# Patient Record
Sex: Female | Born: 1960 | Race: White | Hispanic: No | Marital: Single | Smoking: Former smoker
Health system: Southern US, Community
[De-identification: ages and names within clinical notes are randomized; demographics above are authoritative.]

## PROBLEM LIST (undated history)

## (undated) DIAGNOSIS — M1990 Unspecified osteoarthritis, unspecified site: Secondary | ICD-10-CM

## (undated) DIAGNOSIS — I890 Lymphedema, not elsewhere classified: Secondary | ICD-10-CM

## (undated) DIAGNOSIS — Z9981 Dependence on supplemental oxygen: Secondary | ICD-10-CM

## (undated) DIAGNOSIS — I509 Heart failure, unspecified: Secondary | ICD-10-CM

## (undated) DIAGNOSIS — F32A Depression, unspecified: Secondary | ICD-10-CM

## (undated) DIAGNOSIS — Z9289 Personal history of other medical treatment: Secondary | ICD-10-CM

## (undated) DIAGNOSIS — I10 Essential (primary) hypertension: Secondary | ICD-10-CM

## (undated) DIAGNOSIS — G510 Bell's palsy: Secondary | ICD-10-CM

## (undated) DIAGNOSIS — R0600 Dyspnea, unspecified: Secondary | ICD-10-CM

## (undated) DIAGNOSIS — I080 Rheumatic disorders of both mitral and aortic valves: Secondary | ICD-10-CM

## (undated) DIAGNOSIS — F419 Anxiety disorder, unspecified: Secondary | ICD-10-CM

## (undated) DIAGNOSIS — I4892 Unspecified atrial flutter: Secondary | ICD-10-CM

## (undated) DIAGNOSIS — J449 Chronic obstructive pulmonary disease, unspecified: Secondary | ICD-10-CM

## (undated) DIAGNOSIS — R011 Cardiac murmur, unspecified: Secondary | ICD-10-CM

## (undated) DIAGNOSIS — L03116 Cellulitis of left lower limb: Secondary | ICD-10-CM

## (undated) DIAGNOSIS — G4730 Sleep apnea, unspecified: Secondary | ICD-10-CM

## (undated) DIAGNOSIS — D649 Anemia, unspecified: Secondary | ICD-10-CM

## (undated) DIAGNOSIS — N951 Menopausal and female climacteric states: Secondary | ICD-10-CM

## (undated) DIAGNOSIS — E785 Hyperlipidemia, unspecified: Secondary | ICD-10-CM

## (undated) DIAGNOSIS — M109 Gout, unspecified: Secondary | ICD-10-CM

## (undated) DIAGNOSIS — E559 Vitamin D deficiency, unspecified: Secondary | ICD-10-CM

## (undated) DIAGNOSIS — N183 Chronic kidney disease, stage 3 unspecified: Secondary | ICD-10-CM

## (undated) DIAGNOSIS — D751 Secondary polycythemia: Secondary | ICD-10-CM

## (undated) DIAGNOSIS — F329 Major depressive disorder, single episode, unspecified: Secondary | ICD-10-CM

## (undated) DIAGNOSIS — E781 Pure hyperglyceridemia: Secondary | ICD-10-CM

## (undated) DIAGNOSIS — J309 Allergic rhinitis, unspecified: Secondary | ICD-10-CM

## (undated) DIAGNOSIS — Z8719 Personal history of other diseases of the digestive system: Secondary | ICD-10-CM

## (undated) HISTORY — DX: Essential (primary) hypertension: I10

## (undated) HISTORY — PX: TONSILLECTOMY: SUR1361

## (undated) HISTORY — DX: Sleep apnea, unspecified: G47.30

## (undated) HISTORY — DX: Gout, unspecified: M10.9

## (undated) HISTORY — PX: GASTRIC BYPASS: SHX52

## (undated) HISTORY — DX: Hyperlipidemia, unspecified: E78.5

## (undated) HISTORY — DX: Lymphedema, not elsewhere classified: I89.0

## (undated) HISTORY — DX: Rheumatic disorders of both mitral and aortic valves: I08.0

## (undated) HISTORY — PX: CARDIAC VALVE REPLACEMENT: SHX585

## (undated) HISTORY — DX: Vitamin D deficiency, unspecified: E55.9

## (undated) HISTORY — DX: Morbid (severe) obesity due to excess calories: E66.01

## (undated) HISTORY — DX: Major depressive disorder, single episode, unspecified: F32.9

## (undated) HISTORY — DX: Chronic obstructive pulmonary disease, unspecified: J44.9

## (undated) HISTORY — DX: Allergic rhinitis, unspecified: J30.9

## (undated) HISTORY — DX: Menopausal and female climacteric states: N95.1

## (undated) HISTORY — DX: Pure hyperglyceridemia: E78.1

## (undated) HISTORY — PX: ABDOMINAL SURGERY: SHX537

---

## 1999-04-06 ENCOUNTER — Encounter (INDEPENDENT_AMBULATORY_CARE_PROVIDER_SITE_OTHER): Payer: Self-pay | Admitting: Specialist

## 1999-04-06 ENCOUNTER — Other Ambulatory Visit: Admission: RE | Admit: 1999-04-06 | Discharge: 1999-04-06 | Payer: Self-pay | Admitting: Obstetrics & Gynecology

## 1999-04-28 ENCOUNTER — Other Ambulatory Visit: Admission: RE | Admit: 1999-04-28 | Discharge: 1999-04-28 | Payer: Self-pay | Admitting: Obstetrics & Gynecology

## 1999-04-28 ENCOUNTER — Encounter (INDEPENDENT_AMBULATORY_CARE_PROVIDER_SITE_OTHER): Payer: Self-pay | Admitting: Specialist

## 1999-07-20 ENCOUNTER — Other Ambulatory Visit: Admission: RE | Admit: 1999-07-20 | Discharge: 1999-07-20 | Payer: Self-pay | Admitting: Obstetrics & Gynecology

## 1999-08-21 ENCOUNTER — Ambulatory Visit (HOSPITAL_COMMUNITY): Admission: RE | Admit: 1999-08-21 | Discharge: 1999-08-21 | Payer: Self-pay | Admitting: Obstetrics & Gynecology

## 1999-08-21 ENCOUNTER — Encounter (INDEPENDENT_AMBULATORY_CARE_PROVIDER_SITE_OTHER): Payer: Self-pay

## 2001-04-13 ENCOUNTER — Ambulatory Visit (HOSPITAL_COMMUNITY): Admission: RE | Admit: 2001-04-13 | Discharge: 2001-04-13 | Payer: Self-pay | Admitting: Family Medicine

## 2002-12-26 ENCOUNTER — Encounter: Admission: RE | Admit: 2002-12-26 | Discharge: 2003-02-27 | Payer: Self-pay | Admitting: Family Medicine

## 2003-01-07 ENCOUNTER — Encounter: Payer: Self-pay | Admitting: Family Medicine

## 2003-01-07 ENCOUNTER — Encounter: Admission: RE | Admit: 2003-01-07 | Discharge: 2003-01-07 | Payer: Self-pay | Admitting: Family Medicine

## 2005-12-08 ENCOUNTER — Encounter: Admission: RE | Admit: 2005-12-08 | Discharge: 2005-12-08 | Payer: Self-pay | Admitting: Family Medicine

## 2007-01-23 ENCOUNTER — Encounter: Admission: RE | Admit: 2007-01-23 | Discharge: 2007-01-23 | Payer: Self-pay | Admitting: Family Medicine

## 2007-04-17 DIAGNOSIS — I890 Lymphedema, not elsewhere classified: Secondary | ICD-10-CM | POA: Insufficient documentation

## 2007-10-12 ENCOUNTER — Encounter: Admission: RE | Admit: 2007-10-12 | Discharge: 2007-10-12 | Payer: Self-pay | Admitting: Family Medicine

## 2007-11-12 ENCOUNTER — Encounter: Admission: RE | Admit: 2007-11-12 | Discharge: 2007-11-12 | Payer: Self-pay | Admitting: Orthopedic Surgery

## 2008-05-29 ENCOUNTER — Encounter: Admission: RE | Admit: 2008-05-29 | Discharge: 2008-05-29 | Payer: Self-pay | Admitting: Family Medicine

## 2008-12-23 ENCOUNTER — Encounter: Admission: RE | Admit: 2008-12-23 | Discharge: 2008-12-23 | Payer: Self-pay | Admitting: Family Medicine

## 2009-02-10 ENCOUNTER — Encounter: Admission: RE | Admit: 2009-02-10 | Discharge: 2009-02-10 | Payer: Self-pay | Admitting: Family Medicine

## 2010-02-23 ENCOUNTER — Encounter: Admission: RE | Admit: 2010-02-23 | Discharge: 2010-02-23 | Payer: Self-pay | Admitting: Nephrology

## 2010-04-01 DIAGNOSIS — R7301 Impaired fasting glucose: Secondary | ICD-10-CM | POA: Insufficient documentation

## 2010-04-01 DIAGNOSIS — E559 Vitamin D deficiency, unspecified: Secondary | ICD-10-CM | POA: Insufficient documentation

## 2010-06-25 DIAGNOSIS — N183 Chronic kidney disease, stage 3 unspecified: Secondary | ICD-10-CM | POA: Diagnosis present

## 2010-10-07 DIAGNOSIS — F329 Major depressive disorder, single episode, unspecified: Secondary | ICD-10-CM | POA: Insufficient documentation

## 2010-10-07 DIAGNOSIS — I10 Essential (primary) hypertension: Secondary | ICD-10-CM | POA: Insufficient documentation

## 2010-10-16 NOTE — Op Note (Signed)
[facility]  Patient:    [name], [name]                    MRN: [id number] Proc. Date: 08/21/99 Adm. Date:  [id number] Disc. Date: [id number] Attending:  Jabier Mutton                           Operative Report  PREOPERATIVE DIAGNOSIS:  Persistent severe cervical dysplasia.  POSTOPERATIVE DIAGNOSIS:  Persistent severe cervical dysplasia.  OPERATION:  Cold knife cervical cone biopsy.  SURGEON:  Richard D. Deatra Ina, M.D.  ANESTHESIA:  Paracervical  block with IV sedation.  ESTIMATED BLOOD LOSS:  10 cc.  FINDINGS:  The cervix was posterior and somewhat fixed in the pelvis.  On Lugol  staining, there was only minimal Lugol positive area at the site of the external os.  INDICATIONS:  This is a 50 year old female who has been evaluated in the office for high grade intraepithelial neoplasia.  The patient had a colposcopically directed biopsy, followed by a LEEP cone biopsy of the cervix in December.  Subsequent Pap smears showed persistence of high grade lesion and decision was made to do a cone biopsy in the hospital.  DESCRIPTION OF PROCEDURE:  The patient was taken to the operating room and placed in dorsal lithotomy position.  IV sedation was administered.  The paracervical tissues were then infiltrated with 1:200,000 epinephrine in 0.5% solution to create anesthesia and hemostasis.  With great difficulty the cervix was exposed.  Two Vicryl 1 sutures were place, one at 3 oclock and one at 9 oclock.  These were tied down for hemostasis and for traction on the cervix .  The cervix was then stained with Lugols stain and most of the cervix was Lugols negative with only  small area of Lugols positive at the area of the external os.  A narrow deep cone biopsy was then performed.  The base of the cone was then cauterized with Bovie  current and then Monsel solution was placed there too.  The stay sutures were cut but the hemostatic  aspect of the suture remained in place.  The procedure was then terminated.  The patient left the operating room in good condition. DD:  08/21/99 TD:  08/23/99 Job: [id number]

## 2011-04-16 DIAGNOSIS — M109 Gout, unspecified: Secondary | ICD-10-CM | POA: Diagnosis present

## 2012-05-04 ENCOUNTER — Other Ambulatory Visit: Payer: Self-pay | Admitting: Family Medicine

## 2012-05-04 DIAGNOSIS — Z1231 Encounter for screening mammogram for malignant neoplasm of breast: Secondary | ICD-10-CM

## 2012-06-21 ENCOUNTER — Ambulatory Visit
Admission: RE | Admit: 2012-06-21 | Discharge: 2012-06-21 | Disposition: A | Discharge status: Home / Self Care | Payer: Medicare Other | Source: Ambulatory Visit | Attending: Family Medicine | Admitting: Family Medicine

## 2012-06-21 DIAGNOSIS — Z1231 Encounter for screening mammogram for malignant neoplasm of breast: Secondary | ICD-10-CM

## 2012-07-19 ENCOUNTER — Institutional Professional Consult (permissible substitution): Payer: Medicare Other | Admitting: Pulmonary Disease

## 2012-08-09 ENCOUNTER — Encounter: Payer: Self-pay | Admitting: Pulmonary Disease

## 2012-08-09 ENCOUNTER — Ambulatory Visit (INDEPENDENT_AMBULATORY_CARE_PROVIDER_SITE_OTHER): Payer: Medicare Other | Admitting: Pulmonary Disease

## 2012-08-09 ENCOUNTER — Other Ambulatory Visit: Payer: Medicare Other

## 2012-08-09 VITALS — BP 138/82 | HR 74 | Temp 98.0°F | Ht 67.0 in | Wt 309.0 lb

## 2012-08-09 DIAGNOSIS — R9389 Abnormal findings on diagnostic imaging of other specified body structures: Secondary | ICD-10-CM | POA: Insufficient documentation

## 2012-08-09 NOTE — Progress Notes (Signed)
  Subjective:    Patient ID: [name], female    DOB: 1961-01-18, 52 y.o.   MRN: [id number]  HPI The patient is a 52 year old female who I have been asked to see for an abnormal CT chest.  She was recently found to have an abnormal chest x-ray, followed by a CT chest with multiple findings.  This showed innumerable micronodules primarily in the lower lung zones, as well as significant mosaic perfusion abnormalities consistent with small airways disease.  There was no significant lymphadenopathy, however it was a noncontrast CT chest.  The patient has a long history of tobacco abuse, and continues to do so.  It sounds like she has had spirometry, but has never had full pulmonary function studies.  She currently is on disability for lymphedema, but has worked in [employer] in the past.  She does not have any shortness of breath at rest, but describes a one block dyspnea on exertion at a moderate pace on flat ground.  She will get winded bringing groceries in from the car, but does not get winded with paced light housework.  She has a mild cough but minimal mucus production.  She does not think her breathing has changed over the last 6-12 months.  She has 2 dogs in her household, but no birds or other exotic pets.  She was noted 6 weeks ago to have mold in her apartment in the closet and on the carpet, but this has been taken care of by the landlord.  She denies any farm or rural exposure, nor has she been in [employer]. She has no history of TB exposure, and has had a negative TB skin test in the past.    Review of Systems  Constitutional: Negative for fever and unexpected weight change.  HENT: Positive for postnasal drip. Negative for ear pain, nosebleeds, congestion, sore throat, rhinorrhea, sneezing, trouble swallowing, dental problem and sinus pressure.   Eyes: Negative for redness and itching.  Respiratory: Positive for cough, shortness of breath and wheezing. Negative for chest  tightness.   Cardiovascular: Negative for palpitations and leg swelling.  Gastrointestinal: Positive for diarrhea. Negative for nausea and vomiting.  Genitourinary: Negative for dysuria.  Musculoskeletal: Negative for joint swelling.  Skin: Negative for rash.  Neurological: Positive for light-headedness. Negative for headaches.  Hematological: Does not bruise/bleed easily.  Psychiatric/Behavioral: Negative for dysphoric mood. The patient is not nervous/anxious.        Objective:   Physical Exam Constitutional:  Morbidly obese female, no acute distress  HENT:  Nares patent without discharge  Oropharynx without exudate, palate and uvula are thick and elongated.  Small oral cavity  Eyes:  Perrla, eomi, no scleral icterus  Neck:  No JVD, no TMG  Cardiovascular:  Normal rate, regular rhythm, no rubs or gallops.  3/6 sem        Unable to palpate distal pulses.   Pulmonary :  Normal breath sounds, no stridor or respiratory distress   No rales or wheezing, a few rhonchi  Abdominal:  Soft, nondistended, bowel sounds present.  No tenderness noted.   Musculoskeletal:  3+ lymphedema RLE, minimal edema LLE  Lymph Nodes:  No cervical lymphadenopathy noted  Skin:  No cyanosis noted  Neurologic:  Alert, appropriate, moves all 4 extremities without obvious deficit.         Assessment & Plan:

## 2012-08-09 NOTE — Addendum Note (Signed)
Addended by: Inge Rise on: 08/09/2012 03:51 PM   Modules accepted: Orders

## 2012-08-09 NOTE — Patient Instructions (Addendum)
Will check bloodwork today for possible allergy that may affect your lungs. Will schedule for breathing studies You MUST stop smoking, since I am suspicious a lot of your chest xray abnormalities are related to this. Will arrange followup with me once results are available.

## 2012-08-09 NOTE — Assessment & Plan Note (Signed)
The patient has innumerable micronodules on her CT chest that are lower lobe predominant, along with significant mosaicism.  This is most likely inflammatory in origin, and associated with airflow obstruction especially in the small airways.  Hypersensitivity pneumonitis could present in this fashion, however this usually shows upper lobe predominant disease.  The patient does not have any significant exposure by history, but we'll check a hypersensitivity pneumonitis panel for completeness.  I suspect this is not sarcoidosis, or infection.  My final thought is whether this could represent respiratory bronchiolitis with interstitial lung disease.  This is clearly more of an inflammatory process.  I would like to check full pulmonary function studies as well as blood work, and she may ultimately need bronchoscopy with biopsy.  I have stressed to her the importance of complete smoking cessation.

## 2012-08-15 ENCOUNTER — Ambulatory Visit (HOSPITAL_COMMUNITY)
Admission: RE | Admit: 2012-08-15 | Discharge: 2012-08-15 | Disposition: A | Discharge status: Home / Self Care | Payer: Medicare Other | Source: Ambulatory Visit | Attending: Pulmonary Disease | Admitting: Pulmonary Disease

## 2012-08-15 ENCOUNTER — Encounter: Payer: Self-pay | Admitting: Pulmonary Disease

## 2012-08-15 DIAGNOSIS — R918 Other nonspecific abnormal finding of lung field: Secondary | ICD-10-CM | POA: Insufficient documentation

## 2012-08-15 DIAGNOSIS — R062 Wheezing: Secondary | ICD-10-CM | POA: Insufficient documentation

## 2012-08-15 DIAGNOSIS — R9389 Abnormal findings on diagnostic imaging of other specified body structures: Secondary | ICD-10-CM

## 2012-08-15 DIAGNOSIS — R0609 Other forms of dyspnea: Secondary | ICD-10-CM | POA: Insufficient documentation

## 2012-08-15 DIAGNOSIS — R0989 Other specified symptoms and signs involving the circulatory and respiratory systems: Secondary | ICD-10-CM | POA: Insufficient documentation

## 2012-08-15 DIAGNOSIS — R059 Cough, unspecified: Secondary | ICD-10-CM | POA: Insufficient documentation

## 2012-08-15 DIAGNOSIS — J988 Other specified respiratory disorders: Secondary | ICD-10-CM | POA: Insufficient documentation

## 2012-08-15 LAB — PULMONARY FUNCTION TEST

## 2012-08-15 MED ORDER — ALBUTEROL SULFATE (5 MG/ML) 0.5% IN NEBU
2.5000 mg | INHALATION_SOLUTION | Freq: Once | RESPIRATORY_TRACT | Status: AC
  Administered 2012-08-15: 2.5 mg via RESPIRATORY_TRACT

## 2012-08-16 LAB — HYPERSENSITIVITY PNUEMONITIS PROFILE

## 2012-08-21 ENCOUNTER — Telehealth: Payer: Self-pay | Admitting: Pulmonary Disease

## 2012-08-21 NOTE — Telephone Encounter (Signed)
Result Note    Please let pt know that her bloodwork for inhaled material that can cause allergic reaction in lung is all negative.      lmtcb x1 for pt

## 2012-08-21 NOTE — Telephone Encounter (Signed)
[name], you do not need to send note to me regarding these unless pt is calling specifically for the results and eager to review.  Will go thru green folder as week progresses.

## 2012-08-21 NOTE — Telephone Encounter (Signed)
PFT results have been received and placed in [facility] green folder for review. Please advise, thanks.   

## 2012-08-22 NOTE — Telephone Encounter (Signed)
I spoke with patient about results and she verbalized understanding and had no questions 

## 2012-08-28 ENCOUNTER — Other Ambulatory Visit (HOSPITAL_COMMUNITY): Payer: Self-pay | Admitting: Cardiovascular Disease

## 2012-08-28 DIAGNOSIS — I739 Peripheral vascular disease, unspecified: Secondary | ICD-10-CM

## 2012-08-28 DIAGNOSIS — I050 Rheumatic mitral stenosis: Secondary | ICD-10-CM

## 2012-08-28 DIAGNOSIS — I350 Nonrheumatic aortic (valve) stenosis: Secondary | ICD-10-CM

## 2012-08-30 ENCOUNTER — Ambulatory Visit (HOSPITAL_COMMUNITY)
Admission: RE | Admit: 2012-08-30 | Discharge: 2012-08-30 | Disposition: A | Discharge status: Home / Self Care | Payer: Medicare Other | Source: Ambulatory Visit | Attending: Cardiovascular Disease | Admitting: Cardiovascular Disease

## 2012-08-30 DIAGNOSIS — I080 Rheumatic disorders of both mitral and aortic valves: Secondary | ICD-10-CM | POA: Insufficient documentation

## 2012-08-30 DIAGNOSIS — I350 Nonrheumatic aortic (valve) stenosis: Secondary | ICD-10-CM

## 2012-08-30 DIAGNOSIS — I739 Peripheral vascular disease, unspecified: Secondary | ICD-10-CM

## 2012-08-30 DIAGNOSIS — I050 Rheumatic mitral stenosis: Secondary | ICD-10-CM

## 2012-08-30 DIAGNOSIS — I10 Essential (primary) hypertension: Secondary | ICD-10-CM | POA: Insufficient documentation

## 2012-08-30 DIAGNOSIS — G4730 Sleep apnea, unspecified: Secondary | ICD-10-CM | POA: Insufficient documentation

## 2012-08-30 DIAGNOSIS — E785 Hyperlipidemia, unspecified: Secondary | ICD-10-CM | POA: Insufficient documentation

## 2012-08-30 DIAGNOSIS — I079 Rheumatic tricuspid valve disease, unspecified: Secondary | ICD-10-CM | POA: Insufficient documentation

## 2012-08-30 NOTE — Progress Notes (Signed)
2D Echo Performed 08/30/2012    Marygrace Drought, RCS

## 2012-08-30 NOTE — Progress Notes (Signed)
Carotid Duplex Completed. Abou Sterkel D  

## 2012-09-11 ENCOUNTER — Encounter: Payer: Self-pay | Admitting: Pulmonary Disease

## 2012-09-12 ENCOUNTER — Encounter: Payer: Self-pay | Admitting: Pulmonary Disease

## 2012-09-19 ENCOUNTER — Telehealth: Payer: Self-pay | Admitting: Pulmonary Disease

## 2012-09-19 DIAGNOSIS — R9389 Abnormal findings on diagnostic imaging of other specified body structures: Secondary | ICD-10-CM

## 2012-09-19 DIAGNOSIS — R918 Other nonspecific abnormal finding of lung field: Secondary | ICD-10-CM

## 2012-09-19 NOTE — Telephone Encounter (Signed)
I spoke with patient about results and she verbalized understanding and had no questions. She agree's to the scan and is aware she will be called to have this set up. Please advise [name] thanks

## 2012-09-19 NOTE — Telephone Encounter (Signed)
Patient had PFT done 08/15/12 at [facility]. Requesting results of these. They are scanned in system --> scanned on 08/22/12 in Notes tab. I have also printed and given to As[place] for Dr. Gwenette Greet if needed. Dr. Gwenette Greet please advise, thank you!

## 2012-09-19 NOTE — Telephone Encounter (Signed)
Please let pt know that her breathing studies are actually pretty good.  Does not show any obstruction in her airways, her lung capacity was normal, and her absorption of oxygen from her air sacs was only minimally reduced.  I think we should do a f/u ct chest since it has been 3-4 mos and see if her abnormality has resolved.  If she is agreeable, let me know.

## 2012-09-20 ENCOUNTER — Other Ambulatory Visit: Payer: Self-pay | Admitting: Pulmonary Disease

## 2012-09-20 DIAGNOSIS — R9389 Abnormal findings on diagnostic imaging of other specified body structures: Secondary | ICD-10-CM

## 2012-09-21 ENCOUNTER — Other Ambulatory Visit: Payer: Medicare Other

## 2012-09-21 NOTE — Telephone Encounter (Signed)
ATC patient to make her aware CT had been ordered. In midst of calling I saw CT was ordered but cancelled by patient. Would like to know why? Has she rescheduled? LMOMTCB

## 2012-09-22 NOTE — Telephone Encounter (Signed)
As soon as she can do it.

## 2012-09-22 NOTE — Telephone Encounter (Signed)
Spoke with patient, CT was cancelled by patient due to scheduling conflicts. Patient needs CT done on a Saturday and is willing to go where ever there is a place open on Saturday-- Spoke w [facility] is open on Saturdays Next thing before scheduling CT, patient would like to know how soon Dr. Gwenette Greet wants this done? Please advise Dr. Gwenette Greet, thank you!

## 2012-09-25 NOTE — Telephone Encounter (Signed)
Order placed to have CT done at [place], [place]

## 2012-09-26 ENCOUNTER — Telehealth: Payer: Self-pay | Admitting: Pulmonary Disease

## 2012-09-26 NOTE — Telephone Encounter (Signed)
Pt aware of reason behind having repeat CT per last OV with [facility]--expressed understanding.  Pt is scheduled to have CT 10/04/12 at 430 at [facility]  Nothing further needed.

## 2012-10-04 ENCOUNTER — Ambulatory Visit
Admission: RE | Admit: 2012-10-04 | Discharge: 2012-10-04 | Disposition: A | Discharge status: Home / Self Care | Payer: Medicare Other | Source: Ambulatory Visit | Attending: Pulmonary Disease | Admitting: Pulmonary Disease

## 2012-10-04 DIAGNOSIS — R9389 Abnormal findings on diagnostic imaging of other specified body structures: Secondary | ICD-10-CM

## 2012-10-05 ENCOUNTER — Telehealth: Payer: Self-pay | Admitting: Pulmonary Disease

## 2012-10-05 NOTE — Telephone Encounter (Signed)
Called and spoke with pt and she is aware that [facility] is out of the office this afternoon but we will forward this message to him to make him aware that the pt did call back for her results.

## 2012-10-09 NOTE — Telephone Encounter (Signed)
I spoke with pt and she stated she would like to wait until but she would like to know what days. Please advise [place] thanks

## 2012-10-09 NOTE — Telephone Encounter (Signed)
Pt requests to speak w/ [facility] regarding results & recommendations [phone].  Satira Anis

## 2012-10-09 NOTE — Telephone Encounter (Signed)
Tues, wed am's

## 2012-10-09 NOTE — Telephone Encounter (Signed)
Called, spoke with pt. States on Friday, [place] mentioned doing a bronch on Tuesday, Wednesday, or Friday of this week.  Reports her allergies and sinuses are flared up now.  She is scheduled to see her PCP today at 1 pm for this.  She would like to know if she could proceed with bronch with this or should she wait another week until this is cleared up.  Dr. Gwenette Greet, pls advise.  Thank you.

## 2012-10-09 NOTE — Telephone Encounter (Signed)
I could do this week or put off until next week if she would be more comfortable.

## 2012-10-09 NOTE — Telephone Encounter (Signed)
See if opening at [facility] for 730am on 10/24/12.  Small scope, no tb risk, fluoro is needed.  Let me know if scheduled so I can put on my calendar.  Thanks.

## 2012-10-09 NOTE — Telephone Encounter (Signed)
I spoke with pt and advised her of [place] recs. She is wanting to do bronch 10/24/12. Please advise [place] thanks

## 2012-10-09 NOTE — Telephone Encounter (Signed)
Closed at 4:30 today. Will have to call in the morning to schedule this appt.

## 2012-10-10 NOTE — Telephone Encounter (Signed)
Scheduled for 10/24/12 at 730am at [place]. Patient aware of appt/date/time Aware to HOLD mornings meds and HOLD Asp 81- 72 hrs prior Aware of other instructions---will be mailed to patient home address to review.

## 2012-10-12 NOTE — Telephone Encounter (Signed)
Appt has been changed to 10/25/12 at 730 per Dr Gwenette Greet. (has meeting the morning of 10/24/12) Pt aware of change---aware to still HOLD her asp 81--72hrs prior to procedure.  Nothing further needed.

## 2012-10-24 ENCOUNTER — Encounter (HOSPITAL_COMMUNITY): Payer: Self-pay

## 2012-10-24 ENCOUNTER — Encounter (HOSPITAL_COMMUNITY): Payer: Medicare Other

## 2012-10-25 ENCOUNTER — Telehealth: Payer: Self-pay | Admitting: Pulmonary Disease

## 2012-10-25 ENCOUNTER — Ambulatory Visit (HOSPITAL_COMMUNITY)
Admission: RE | Admit: 2012-10-25 | Discharge: 2012-10-25 | Disposition: A | Discharge status: Home / Self Care | Payer: Medicare Other | Source: Ambulatory Visit | Attending: Pulmonary Disease | Admitting: Pulmonary Disease

## 2012-10-25 ENCOUNTER — Encounter (HOSPITAL_COMMUNITY): Payer: Self-pay | Admitting: Respiratory Therapy

## 2012-10-25 ENCOUNTER — Encounter (HOSPITAL_COMMUNITY)
Admission: RE | Disposition: A | Discharge status: Home / Self Care | Payer: Self-pay | Source: Ambulatory Visit | Attending: Pulmonary Disease

## 2012-10-25 ENCOUNTER — Ambulatory Visit (HOSPITAL_COMMUNITY): Payer: Medicare Other

## 2012-10-25 DIAGNOSIS — I890 Lymphedema, not elsewhere classified: Secondary | ICD-10-CM | POA: Insufficient documentation

## 2012-10-25 DIAGNOSIS — D491 Neoplasm of unspecified behavior of respiratory system: Secondary | ICD-10-CM

## 2012-10-25 DIAGNOSIS — R918 Other nonspecific abnormal finding of lung field: Secondary | ICD-10-CM | POA: Insufficient documentation

## 2012-10-25 DIAGNOSIS — F17200 Nicotine dependence, unspecified, uncomplicated: Secondary | ICD-10-CM | POA: Insufficient documentation

## 2012-10-25 HISTORY — PX: VIDEO BRONCHOSCOPY: SHX5072

## 2012-10-25 LAB — BODY FLUID CELL COUNT WITH DIFFERENTIAL
Lymphs, Fluid: 4 %
Monocyte-Macrophage-Serous Fluid: 69 % (ref 50–90)
Neutrophil Count, Fluid: 27 % — ABNORMAL HIGH (ref 0–25)

## 2012-10-25 SURGERY — BRONCHOSCOPY, WITH FLUOROSCOPY
Anesthesia: Moderate Sedation | Laterality: Bilateral

## 2012-10-25 MED ORDER — MIDAZOLAM HCL 10 MG/2ML IJ SOLN
INTRAMUSCULAR | Status: DC | PRN
  Administered 2012-10-25: 5 mg via INTRAVENOUS
  Administered 2012-10-25: 2.5 mg via INTRAVENOUS

## 2012-10-25 MED ORDER — SODIUM CHLORIDE 0.9 % IV BOLUS (SEPSIS): 250.0000 mL | Freq: Once | INTRAVENOUS | Status: DC

## 2012-10-25 MED ORDER — MEPERIDINE HCL 100 MG/ML IJ SOLN
INTRAMUSCULAR | Status: AC
  Filled 2012-10-25: qty 2

## 2012-10-25 MED ORDER — PHENYLEPHRINE HCL 0.25 % NA SOLN
NASAL | Status: DC | PRN
  Administered 2012-10-25: 2 via NASAL

## 2012-10-25 MED ORDER — SODIUM CHLORIDE 0.9 % IV SOLN
INTRAVENOUS | Status: DC
  Administered 2012-10-25: 08:00:00 via INTRAVENOUS

## 2012-10-25 MED ORDER — LIDOCAINE HCL (PF) 1 % IJ SOLN
INTRAMUSCULAR | Status: DC | PRN
  Administered 2012-10-25: 6 mL

## 2012-10-25 MED ORDER — MEPERIDINE HCL 25 MG/ML IJ SOLN
INTRAMUSCULAR | Status: DC | PRN
  Administered 2012-10-25: 50 mg via INTRAVENOUS

## 2012-10-25 MED ORDER — ALBUTEROL SULFATE (5 MG/ML) 0.5% IN NEBU
2.5000 mg | INHALATION_SOLUTION | Freq: Once | RESPIRATORY_TRACT | Status: AC
  Administered 2012-10-25: 2.5 mg via RESPIRATORY_TRACT

## 2012-10-25 MED ORDER — LIDOCAINE HCL 2 % EX GEL
CUTANEOUS | Status: DC | PRN
  Administered 2012-10-25: 1

## 2012-10-25 MED ORDER — MIDAZOLAM HCL 10 MG/2ML IJ SOLN
INTRAMUSCULAR | Status: AC
  Filled 2012-10-25: qty 4

## 2012-10-25 NOTE — Op Note (Signed)
Dictation #:  [id number]

## 2012-10-25 NOTE — H&P (Addendum)
Subjective:       Patient ID: [name], female    DOB: 1961/04/28, 52 y.o.   MRN: [id number]   HPI The patient is a 52 year old female who I have been asked to see for an abnormal CT chest.  She was recently found to have an abnormal chest x-ray, followed by a CT chest with multiple findings.  This showed innumerable micronodules primarily in the lower lung zones, as well as significant mosaic perfusion abnormalities consistent with small airways disease.  There was no significant lymphadenopathy, however it was a noncontrast CT chest.  The patient has a long history of tobacco abuse, and continues to do so.  It sounds like she has had spirometry, but has never had full pulmonary function studies.  She currently is on disability for lymphedema, but has worked in [employer] in the past.  She does not have any shortness of breath at rest, but describes a one block dyspnea on exertion at a moderate pace on flat ground.  She will get winded bringing groceries in from the car, but does not get winded with paced light housework.  She has a mild cough but minimal mucus production.  She does not think her breathing has changed over the last 6-12 months.  She has 2 dogs in her household, but no birds or other exotic pets.  She was noted 6 weeks ago to have mold in her apartment in the closet and on the carpet, but this has been taken care of by the landlord.  She denies any farm or rural exposure, nor has she been in [employer]. She has no history of TB exposure, and has had a negative TB skin test in the past.      Review of Systems  Constitutional: Negative for fever and unexpected weight change.  HENT: Positive for postnasal drip. Negative for ear pain, nosebleeds, congestion, sore throat, rhinorrhea, sneezing, trouble swallowing, dental problem and sinus pressure.   Eyes: Negative for redness and itching.  Respiratory: Positive for cough, shortness of breath and wheezing. Negative  for chest tightness.   Cardiovascular: Negative for palpitations and leg swelling.  Gastrointestinal: Positive for diarrhea. Negative for nausea and vomiting.  Genitourinary: Negative for dysuria.  Musculoskeletal: Negative for joint swelling.  Skin: Negative for rash.  Neurological: Positive for light-headedness. Negative for headaches.  Hematological: Does not bruise/bleed easily.  Psychiatric/Behavioral: Negative for dysphoric mood. The patient is not nervous/anxious.          Objective:     Physical Exam Constitutional:  Morbidly obese female, no acute distress   HENT:  Nares patent without discharge             Oropharynx without exudate, palate and uvula are thick and elongated.  Small oral cavity   Eyes:  Perrla, eomi, no scleral icterus   Neck:  No JVD, no TMG   Cardiovascular:  Normal rate, regular rhythm, no rubs or gallops.  3/6 sem                              Unable to palpate distal pulses.    Pulmonary :  Normal breath sounds, no stridor or respiratory distress                         No rales or wheezing, a few rhonchi   Abdominal:  Soft, nondistended, bowel sounds present.  No  tenderness noted.    Musculoskeletal:  3+ lymphedema RLE, minimal edema LLE   Lymph Nodes:  No cervical lymphadenopathy noted   Skin:  No cyanosis noted   Neurologic:  Alert, appropriate, moves all 4 extremities without obvious deficit.               Assessment & Plan:                 Revision History       Date/Time User Action    > 08/09/2012  3:44 PM Kathee Delton, MD Sign      08/09/2012  2:48 PM Ander Purpura Quay Burow, RN Sign at close encounter              Abnormal CT scan, chest - Kathee Delton, MD at 08/09/2012  3:44 PM    Status: Written Related Problem: Abnormal CT scan, chest           The patient has innumerable micronodules on her CT chest that are lower lobe predominant, along with significant mosaicism.  This is most likely  inflammatory in origin, and associated with airflow obstruction especially in the small airways.  Hypersensitivity pneumonitis could present in this fashion, however this usually shows upper lobe predominant disease.  The patient does not have any significant exposure by history, but we'll check a hypersensitivity pneumonitis panel for completeness.  I suspect this is not sarcoidosis, or infection.  My final thought is whether this could represent respiratory bronchiolitis with interstitial lung disease.  This is clearly more of an inflammatory process.  I would like to check full pulmonary function studies as well as blood work, and she may ultimately need bronchoscopy with biopsy.  I have stressed to her the importance of complete smoking cessation.          Encounter-Level Documents:           Electronic signature on 08/09/2012 2:11 PM        Not recorded       Discontinued Medications      Reason for Discontinue    albuterol (PROVENTIL HFA;VENTOLIN HFA) 108 (90 BASE) MCG/ACT inhaler Discontinued by provider       Orders Placed This Encounter    Future Labs/Procedures    Hypersensitivity pnuemonitis profile [id number] Custom]    Expected by: As directed    Expires: 08/09/2013    Pulmonary function test [PFT13 Custom]    Expected by: As directed    Expires: 08/09/2013         Patient Instructions    Will check bloodwork today for possible allergy that may affect your lungs. Will schedule for breathing studies You MUST stop smoking, since I am suspicious a lot of your chest xray abnormalities are related to this. Will arrange followup with me once results are available.        10/25/12 Discussed procedure with pt again, including possible complications.  She is stable for procedure today

## 2012-10-25 NOTE — Telephone Encounter (Signed)
Pt calling for T 101.3 -blood tinged sputum Reassured - fever can occur after bronchoscopy Call if persists tomorrow.

## 2012-10-25 NOTE — Progress Notes (Signed)
Video Bronchoscopy Done  Intervention bronchial biopsy done Intervention bronchial washing done  Procedure tolerated well

## 2012-10-26 NOTE — Op Note (Signed)
NAMEJAYCIANA, [name] NO.:  [id number]  MEDICAL RECORD NO.:  [id number]  LOCATION:  RESP                         FACILITY:  [facility]  PHYSICIAN:  Kathee Delton, MD,FCCPDATE OF BIRTH:  Oct 15, 1960  DATE OF PROCEDURE:  10/25/2012 DATE OF DISCHARGE:  10/25/2012                              OPERATIVE REPORT   PROCEDURE:  Flexible fiberoptic bronchoscopy with biopsy.  INDICATIONS:  Bilateral micronodular disease of unknown origin.  SURGEON:  Kathee Delton, MD, [facility]  ANESTHESIA:  Versed 7.5 mg IV in 2 different aliquots, and Demerol 50 mg IV.  Also, topical 1% lidocaine to the vocal cords and airways during the procedure.  DESCRIPTION OF PROCEDURE:  After obtaining informed consent and under close cardiopulmonary monitoring, the above preop anesthesia was given and the fiberoptic scope was passed to the right naris and into posterior pharynx, where there was no lesions or other abnormalities seen.  The vocal cords appeared to move bilaterally on phonation, there were no obvious abnormalities noted.  The scope was then passed into the trachea where it was examined along its entire length down to the level of the carina, all of which was normal.  The left and right tracheobronchial trees were examined serially to the subsegmental level with no obvious endobronchial abnormality being found.  There was some mild airway edema throughout.  Bronchoalveolar lavage was then done from the various basilar segments of the right lower lobe with good material being obtained.  Transbronchial lung biopsies were also done from the posterior basilar and anterior basilar segments of the right lower lobe with excellent biopsies are being obtained.  There was good hemostasis maintained throughout the procedure.  Overall, the patient tolerated the procedure well and there were no complications.  Chest x-ray post procedure showed no pneumothorax.     Kathee Delton,  MD,FCCP     [facility]/MEDQ  D:  10/25/2012  T:  10/26/2012  Job:  [id number]

## 2012-10-27 ENCOUNTER — Telehealth: Payer: Self-pay | Admitting: Pulmonary Disease

## 2012-10-27 LAB — CULTURE, BAL-QUANTITATIVE W GRAM STAIN: Gram Stain: NONE SEEN

## 2012-10-27 MED ORDER — CEFDINIR 300 MG PO CAPS: ORAL_CAPSULE | ORAL | Status: DC

## 2012-10-27 NOTE — Telephone Encounter (Signed)
Please let pt know that prelim on her biopsy back, but not final.  I need to ask the pathologist some questions. Her culture did grow a bug that is common in the community, and would like to treat her with an abx:  Call in omnicef 300mg , take 2 each am for 7 days.  i will call her Monday if I can get in touch with pathologist.

## 2012-10-27 NOTE — Telephone Encounter (Signed)
Spoke with the pt She had Bronch done Wed 5/28 with [facility] She states that later that night developed fever 101.5- took several hrs to break  She still has low grade temp since then, and c/o rhinitis and coughing up minimal amounts of blood tinged sputum  She is no more SOB than usual and denies any other symptoms I advised that this is to be expected, and to rest, take tylenol prn fever and call if worsening hemoptysis Pt okay with this plan, but will forward to [facility] for any further recs Please advise thanks!

## 2012-10-27 NOTE — Telephone Encounter (Signed)
Called and spoke with pt and she is aware of [place] recs at this time.  Pt is aware of omnicef that has been sent in to her pharmacy and that [facility] will call her back once he has spoken with the pathologist.  Nothing further is needed.

## 2012-10-30 ENCOUNTER — Encounter (HOSPITAL_COMMUNITY): Payer: Self-pay | Admitting: Pulmonary Disease

## 2012-10-30 ENCOUNTER — Telehealth: Payer: Self-pay | Admitting: Pulmonary Disease

## 2012-10-30 NOTE — Telephone Encounter (Signed)
I spoke with pt and she is wanting to know if [facility] has spoken with pathology yet. Please advise [place] thanks

## 2012-11-07 ENCOUNTER — Other Ambulatory Visit: Payer: Self-pay | Admitting: Pulmonary Disease

## 2012-11-07 ENCOUNTER — Telehealth: Payer: Self-pay | Admitting: Pulmonary Disease

## 2012-11-07 DIAGNOSIS — R9389 Abnormal findings on diagnostic imaging of other specified body structures: Secondary | ICD-10-CM

## 2012-11-07 NOTE — Telephone Encounter (Signed)
Please let pt know that she will be called with her apptm time to see surgeon.  Thanks.

## 2012-11-07 NOTE — Telephone Encounter (Signed)
Spoke with patient, patient states she would like to go ahead and have the VATS procedure done. I have informed patient that I will forward msg to Dr. Gwenette Greet and we will get back w her Dr. Gwenette Greet please advise, thank you!

## 2012-11-08 NOTE — Telephone Encounter (Signed)
Spoke with patient, made her aware surgeon will be calling Informed patient if she had any further questions or concerns to please call our office. Nothing further needed at this time

## 2012-11-09 ENCOUNTER — Telehealth: Payer: Self-pay | Admitting: Pulmonary Disease

## 2012-11-09 NOTE — Telephone Encounter (Signed)
Spoke to pt she is aware of this appt [name]

## 2012-11-15 ENCOUNTER — Encounter: Payer: Self-pay | Admitting: Thoracic Surgery (Cardiothoracic Vascular Surgery)

## 2012-11-15 ENCOUNTER — Institutional Professional Consult (permissible substitution) (INDEPENDENT_AMBULATORY_CARE_PROVIDER_SITE_OTHER): Payer: Medicare Other | Admitting: Thoracic Surgery (Cardiothoracic Vascular Surgery)

## 2012-11-15 VITALS — BP 119/78 | HR 79 | Resp 20 | Ht 67.0 in | Wt 300.0 lb

## 2012-11-15 DIAGNOSIS — R9389 Abnormal findings on diagnostic imaging of other specified body structures: Secondary | ICD-10-CM

## 2012-11-15 DIAGNOSIS — R918 Other nonspecific abnormal finding of lung field: Secondary | ICD-10-CM

## 2012-11-15 NOTE — Progress Notes (Signed)
PCP is [name], FNP Referring Provider is Clance, Armando Reichert, MD  Chief Complaint  Patient presents with  . Lung Lesion    Surgical eval for possible VATS procdure, pulmonary nodules    HPI: [name] is a 52 year old woman sent for consultation regarding a lung biopsy for diffuse bilateral pulmonary nodules.  [name] is a 52 year old woman with a history of smoking, obesity, and moderate aortic stenosis. She has been increasing shortness of breath with exertion for the past couple of years. She will note shortness of breath with walking up a flight of stairs or walking about 100 feet on level ground. She also has wheezing and occasional dry cough. She's not had any hemoptysis.  She denies fevers, chills, weight loss. She's not have any known exposures to birds, travel, Architect sites.  She saw Dr. Normajean Baxter CT showed micro-nodularity in both lungs worrisome for inflammatory process. Bronchoscopy with biopsy revealed only benign lung tissue with hemosiderin laden macrophages. She was offered the option of conservative management with smoking cessation and steroids versus surgical lung biopsy.   Past Medical History  Diagnosis Date  . Allergic rhinitis   . Noninfectious lymphedema   . Undiagnosed cardiac murmurs   . Mitral stenosis   . Morbid obesity   . Depressive disorder   . Menopausal symptoms   . Mitral and aortic heart valve diseases, unspecified   . Vitamin D deficiency   . HTN (hypertension)   . Hyperlipidemia   . CAO (chronic airflow obstruction)   . Gout   . Hypertriglyceridemia   . Sleep apnea   . Kidney disease     Dr. Graylon Gunning    Past Surgical History  Procedure Laterality Date  . Video bronchoscopy Bilateral 10/25/2012    Procedure: VIDEO BRONCHOSCOPY WITH FLUORO;  Surgeon: Kathee Delton, MD;  Location: [facility];  Service: Cardiopulmonary;  Laterality: Bilateral;    Family History  Problem Relation Age of Onset  . Emphysema Mother   .  Heart disease Father   . Kidney disease Father   . Cancer Mother     throat    Social History History  Substance Use Topics  . Smoking status: Current Every Day Smoker -- 1.00 packs/day for 35 years    Types: Cigarettes  . Smokeless tobacco: Never Used  . Alcohol Use: No    Current Outpatient Prescriptions  Medication Sig Dispense Refill  . allopurinol (ZYLOPRIM) 300 MG tablet Take 300 mg by mouth daily.       Marland Kitchen BENICAR HCT 40-25 MG per tablet Take 1 tablet by mouth daily.       Marland Kitchen buPROPion (WELLBUTRIN XL) 300 MG 24 hr tablet Take 300 mg by mouth every morning.       . Calcium Carbonate-Vitamin D (CALCIUM-VITAMIN D) 600-200 MG-UNIT CAPS Take 1 tablet by mouth 2 (two) times daily.      . Cholecalciferol (VITAMIN D3) 2000 UNITS TABS Take 1 tablet by mouth daily.      . citalopram (CELEXA) 20 MG tablet Take 20 mg by mouth daily.       . fish oil-omega-3 fatty acids 1000 MG capsule Take 1 g by mouth daily.      . furosemide (LASIX) 40 MG tablet Take 40 mg by mouth daily.       Marland Kitchen KLOR-CON M20 20 MEQ tablet Take 20 mEq by mouth daily.       . metoprolol succinate (TOPROL-XL) 50 MG 24 hr tablet Take 50 mg by mouth  daily.       . pravastatin (PRAVACHOL) 80 MG tablet Take 80 mg by mouth daily.       Marland Kitchen SPIRIVA HANDIHALER 18 MCG inhalation capsule Place 18 mcg into inhaler and inhale daily.        No current facility-administered medications for this visit.    No Known Allergies  Review of Systems  Respiratory: Positive for cough (nonproductive), shortness of breath and wheezing.   Cardiovascular: Positive for palpitations and leg swelling (lymphedema right leg). Negative for chest pain.       Heart murmur  Musculoskeletal: Positive for arthralgias (gout).  All other systems reviewed and are negative.    BP 119/78  Pulse 79  Resp 20  Ht 5\' 7"  (1.702 m)  Wt 300 lb (136.079 kg)  BMI 46.98 kg/m2  SpO2 96% Physical Exam  Vitals reviewed. Constitutional: She is oriented to  person, place, and time. No distress.  Morbidly obese  HENT:  Head: Normocephalic and atraumatic.  Eyes: EOM are normal. Pupils are equal, round, and reactive to light.  Neck: Neck supple. No thyromegaly present.  + transmitted murmur bilaterally  Cardiovascular: Normal rate and regular rhythm.   Murmur (2/6 crescendo- decrescendo RUSB) heard. Pulmonary/Chest: Effort normal. She has no wheezes.  Diminished BS bilaterally  Abdominal: There is no tenderness.  obese  Musculoskeletal: She exhibits edema (4+ edema- right leg, 1+ left leg).  Lymphadenopathy:    She has no cervical adenopathy.  Neurological: She is alert and oriented to person, place, and time. No cranial nerve deficit.  Skin: Skin is warm and dry.     Diagnostic Tests: Pulmonary function testing 08/15/2012 FVC 2.53 (68%) FEV1 1.96 (67%) DLCO 75%  CT chest 10/14/2012 *RADIOLOGY REPORT*  Clinical Data: Follow-up pulmonary nodules.  CT CHEST WITHOUT CONTRAST  Technique: Multidetector CT imaging of the chest was performed  following the standard protocol without IV contrast.  Comparison: Chest radiographs 12/23/2008. Report only from outside  chest CT 07/06/2012 from [facility].  Findings: No enlarged axillary or mediastinal lymph nodes are  identified. Small right paratracheal and hilar lymph nodes are  noted bilaterally. Hilar assessment is limited without contrast.  There is mild atherosclerosis of the thoracic aorta.  There is no pleural or pericardial effusion. There is no  endobronchial lesion.  Scattered linear scarring or atelectasis is present at both lung  bases. There are scattered small pulmonary nodules, the largest in  the right lower lobe measuring 4 mm on image 42. Tiny acinar  densities are distributed throughout both lungs with a slight  basilar predominance. These have a centrilobular distribution and  are not concentrated along the vascular bundles. There is a  prominent area of lucency in  the left upper lobe which was  previously reported. Lesser areas of mosaic attenuation are  distributed elsewhere throughout the lungs.  Images through the upper abdomen demonstrate diffusely decreased  hepatic density consistent with steatosis. There is no adrenal  mass. There are no worrisome osseous findings. Degenerative  changes are present throughout the spine.  IMPRESSION:  1. Micronodularity throughout the lung bases with mosaic  perfusion, similar to findings reported on outside study. Direct  comparison with prior study may be helpful to assess stability.  2. No pathologically enlarged lymph nodes identified. Small  mediastinal and hilar lymph nodes are present bilaterally.  3. No pleural or pericardial effusion.  Findings suggest a chronic inflammatory process with associated  small airways disease. The pattern of micro nodularity is  suggestive of hypersensitivity pneumonitis. Less likely  considerations would include sarcoidosis, chronic miliary infection  and bronchiolitis obliterans.   Surgical pathology 10/25/2012 Benign lung tissue with hemosiderin laden macrophages   Impression: 52 year old woman with a history of tobacco abuse who has exertional shortness of breath. This may be multifactorial given her obesity and moderate aortic stenosis. However her pulmonary evaluation has shown a micro-nodularity throughout the lung bases. This likely is an inflammatory process possibly a hypersensitivity pneumonitis. However the diagnosis has not yet been made. Attempt to make the diagnosis bronchoscopically was unsuccessful. Thoracoscopic lung biopsy is certainly the most likely option to yield a diagnosis, but that is not guaranteed.  I had a long discussion with [name] and her friend who accompanied her regarding VATS lung biopsy. We discussed the general nature of the procedure, the need for general anesthesia, expected hospital stay, and overall recovery. They do  understand that there is no guarantee of a definitive diagnosis. We discussed the indications, risks, benefits, and alternatives. They understand the risks include but are not limited to death, MI, stroke, DVT, PE, bleeding, possible need for transfusion, infection, prolonged air leak, cardiac arrhythmias, as well as other unforeseeable complications. We did discuss that her moderate aortic stenosis does increase her perioperative risk, but is not prohibitive at this time.  [name] wishes to take some time and think over her options before making a decision. I asked her to call our office which is made a decision. If she wishes to proceed with surgical biopsy we will schedule that for her as soon as possible.  If she wishes to discuss this further has any additional questions be happy to see her back in the office or speak with her by telephone.

## 2012-11-16 ENCOUNTER — Encounter: Payer: Medicare Other | Admitting: Thoracic Surgery (Cardiothoracic Vascular Surgery)

## 2012-11-22 LAB — FUNGUS CULTURE W SMEAR
Fungal Smear: NONE SEEN
Special Requests: NORMAL

## 2012-12-07 LAB — AFB CULTURE WITH SMEAR (NOT AT ARMC)
Acid Fast Smear: NONE SEEN
Special Requests: NORMAL

## 2013-06-12 ENCOUNTER — Other Ambulatory Visit: Payer: Self-pay

## 2013-06-12 DIAGNOSIS — Z1231 Encounter for screening mammogram for malignant neoplasm of breast: Secondary | ICD-10-CM

## 2013-07-10 ENCOUNTER — Ambulatory Visit: Payer: Medicare Other

## 2013-07-10 DIAGNOSIS — E785 Hyperlipidemia, unspecified: Secondary | ICD-10-CM | POA: Diagnosis not present

## 2013-07-10 DIAGNOSIS — E781 Pure hyperglyceridemia: Secondary | ICD-10-CM | POA: Diagnosis not present

## 2013-07-10 DIAGNOSIS — I10 Essential (primary) hypertension: Secondary | ICD-10-CM | POA: Diagnosis not present

## 2013-07-10 DIAGNOSIS — J449 Chronic obstructive pulmonary disease, unspecified: Secondary | ICD-10-CM | POA: Diagnosis not present

## 2013-07-10 DIAGNOSIS — I129 Hypertensive chronic kidney disease with stage 1 through stage 4 chronic kidney disease, or unspecified chronic kidney disease: Secondary | ICD-10-CM | POA: Diagnosis not present

## 2013-07-10 DIAGNOSIS — R7301 Impaired fasting glucose: Secondary | ICD-10-CM | POA: Diagnosis not present

## 2013-07-10 DIAGNOSIS — N183 Chronic kidney disease, stage 3 unspecified: Secondary | ICD-10-CM | POA: Diagnosis not present

## 2013-07-10 DIAGNOSIS — I080 Rheumatic disorders of both mitral and aortic valves: Secondary | ICD-10-CM | POA: Diagnosis not present

## 2013-07-11 ENCOUNTER — Telehealth: Payer: Self-pay | Admitting: Pulmonary Disease

## 2013-07-11 NOTE — Telephone Encounter (Signed)
ATC phone line rang several times and no answer and no VM. It just kept ringing wcb

## 2013-07-12 NOTE — Telephone Encounter (Signed)
Spoke with [name] at [facility].  She states that she seen pt 2 days ago and she has had worsening SOB on exertion.  Sats were 89% when lying supine.  Pt has had sleep study in past but never f/u with CPAP.  She is requesting appt with Dr Gwenette Greet for f/u.  Appt scheduled and pt informed of appt.

## 2013-07-13 ENCOUNTER — Telehealth: Payer: Self-pay | Admitting: Pulmonary Disease

## 2013-07-13 NOTE — Telephone Encounter (Signed)
Rec'd 19 actual pages of records from [facility] for Dr. Gwenette Greet 07/13/13. Sent up to Dr. Gwenette Greet to review 07/13/13

## 2013-07-24 ENCOUNTER — Ambulatory Visit: Payer: Medicare Other | Admitting: Pulmonary Disease

## 2013-08-06 ENCOUNTER — Encounter (INDEPENDENT_AMBULATORY_CARE_PROVIDER_SITE_OTHER): Payer: Self-pay

## 2013-08-06 ENCOUNTER — Ambulatory Visit (INDEPENDENT_AMBULATORY_CARE_PROVIDER_SITE_OTHER): Payer: Medicare Other | Admitting: Pulmonary Disease

## 2013-08-06 ENCOUNTER — Encounter: Payer: Self-pay | Admitting: Pulmonary Disease

## 2013-08-06 VITALS — BP 122/82 | HR 66 | Temp 98.0°F | Ht 66.0 in | Wt 319.0 lb

## 2013-08-06 DIAGNOSIS — R9389 Abnormal findings on diagnostic imaging of other specified body structures: Secondary | ICD-10-CM | POA: Diagnosis not present

## 2013-08-06 DIAGNOSIS — J449 Chronic obstructive pulmonary disease, unspecified: Secondary | ICD-10-CM

## 2013-08-06 NOTE — Progress Notes (Addendum)
   Subjective:    Patient ID: [name], female    DOB: 1961/03/13, 53 y.o.   MRN: [id number]  HPI The patient comes in today for followup of her abnormal CT chest. She was evaluated last year with bronchoscopy because of nodularity on her CT chest, and the bronchoscopy was nondiagnostic despite good biopsies. She was referred to thoracic surgery for a Vats bx, was evaluated by the surgeon, but decided against having the biopsy. She comes in today where she continues to have dyspnea on exertion, and unfortunately is still smoking. She now wishes to have further evaluation.   Review of Systems  Constitutional: Negative for fever and unexpected weight change.  HENT: Positive for congestion. Negative for dental problem, ear pain, nosebleeds, postnasal drip, rhinorrhea, sinus pressure, sneezing, sore throat and trouble swallowing.   Eyes: Negative for redness and itching.  Respiratory: Positive for cough and shortness of breath. Negative for chest tightness and wheezing.        Chest soreness  Cardiovascular: Negative for palpitations and leg swelling.  Gastrointestinal: Negative for nausea and vomiting.  Genitourinary: Negative for dysuria.  Musculoskeletal: Negative for joint swelling.  Skin: Negative for rash.  Neurological: Negative for headaches.  Hematological: Does not bruise/bleed easily.  Psychiatric/Behavioral: Negative for dysphoric mood. The patient is not nervous/anxious.        Objective:   Physical Exam Obese female in no acute distress. Nose without purulence or discharge noted. Neck without lymphadenopathy or thyromegaly Chest with decreased breath sounds, a few rhonchi. Cardiac exam with regular rate and rhythm Lower extremities with mild edema, no cyanosis Alert and oriented, moves all 4 extremities.       Assessment & Plan:

## 2013-08-06 NOTE — Patient Instructions (Signed)
Will schedule you for followup scan of chest, then get you back to chest surgeon depending upon results. Stay on spiriva each day, and can use albuterol for rescue Work on total smoking cessation.  This is the key to resolving cough and mucus.

## 2013-08-06 NOTE — Assessment & Plan Note (Signed)
The patient has persistent airway inflammation associated with ongoing smoking, and this results in cough and mucus production. She is continuing on Spiriva, but unfortunately is continuing to smoke. She is trying to cut back, and therefore I would like to give her some time before instituting inhaled corticosteroids. I suspect if she quits smoking, she will need no inhaled bronchodilators.

## 2013-08-06 NOTE — Assessment & Plan Note (Addendum)
CT chest 07/2012:  inumerable micronodules bilat in LL, no signif. LN but noncontrast, +++mosaicism  Echo 11/2010:  Normal EF, mod dilatation LA, mild to mod MR, mod AI, mod AS.  HP panel 07/2012:  Normal  Bronch 09/2012:  Culture + Hflu, benign lung tissue on biopsy.   Thoracic surg eval 10/2012:  Pt decided against bx, and never followed up.  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 She saw the thoracic surgeon, but decided against having a biopsy, and never followed up with me. She comes in today where she is having persistent increased shortness of breath, and I stressed to her the importance of getting this diagnosed and treated. We'll need to repeat her CT chest since it has been almost a year, and then will get her referred back to the surgeon.

## 2013-08-16 ENCOUNTER — Other Ambulatory Visit: Payer: Medicare Other

## 2013-08-22 ENCOUNTER — Other Ambulatory Visit: Payer: Medicare Other

## 2013-08-23 ENCOUNTER — Ambulatory Visit (INDEPENDENT_AMBULATORY_CARE_PROVIDER_SITE_OTHER)
Admission: RE | Admit: 2013-08-23 | Discharge: 2013-08-23 | Disposition: A | Discharge status: Home / Self Care | Payer: Medicare Other | Source: Ambulatory Visit | Attending: Pulmonary Disease | Admitting: Pulmonary Disease

## 2013-08-23 DIAGNOSIS — R9389 Abnormal findings on diagnostic imaging of other specified body structures: Secondary | ICD-10-CM | POA: Diagnosis not present

## 2013-08-23 DIAGNOSIS — R911 Solitary pulmonary nodule: Secondary | ICD-10-CM | POA: Diagnosis not present

## 2013-08-24 ENCOUNTER — Other Ambulatory Visit: Payer: Self-pay | Admitting: Pulmonary Disease

## 2013-08-24 DIAGNOSIS — R9389 Abnormal findings on diagnostic imaging of other specified body structures: Secondary | ICD-10-CM

## 2013-09-04 ENCOUNTER — Encounter: Payer: Self-pay | Admitting: Thoracic Surgery (Cardiothoracic Vascular Surgery)

## 2013-09-04 ENCOUNTER — Ambulatory Visit (INDEPENDENT_AMBULATORY_CARE_PROVIDER_SITE_OTHER): Payer: Medicare Other | Admitting: Thoracic Surgery (Cardiothoracic Vascular Surgery)

## 2013-09-04 ENCOUNTER — Other Ambulatory Visit: Payer: Self-pay | Admitting: *Deleted

## 2013-09-04 VITALS — BP 146/80 | HR 64 | Resp 16 | Ht 67.0 in | Wt 320.0 lb

## 2013-09-04 DIAGNOSIS — R918 Other nonspecific abnormal finding of lung field: Secondary | ICD-10-CM

## 2013-09-04 DIAGNOSIS — R9389 Abnormal findings on diagnostic imaging of other specified body structures: Secondary | ICD-10-CM | POA: Diagnosis not present

## 2013-09-04 DIAGNOSIS — J849 Interstitial pulmonary disease, unspecified: Secondary | ICD-10-CM

## 2013-09-04 NOTE — Progress Notes (Signed)
PCP is [name], FNP Referring Provider is Clance, Armando Reichert, MD  Chief Complaint  Patient presents with  . Lung Lesion    multiple...reassess for biopsy,,,recent CT CHEST    HPI: [name] is a 53 year old woman sent for consultation regarding a lung biopsy for diffuse bilateral pulmonary nodules.   [name] is a 53 year old woman with a history of smoking, obesity, and moderate aortic stenosis. She also has diffuse bilateral pulmonary micronodules. She has been experiencing increasing shortness of breath with exertion for the past couple of years. She will note shortness of breath with walking up a flight of stairs or walking about 100 feet on level ground. She also has wheezing and occasional dry cough. She has not had any hemoptysis.   She denies fevers, chills, weight loss. She has gained some weight. She does not have any known exposures to birds, travel, Architect sites.   Dr. Gwenette Greet did a bronchoscopy with biopsy, but it revealed only benign lung tissue with hemosiderin laden macrophages. She was offered the option of conservative management with smoking cessation and steroids versus surgical lung biopsy.   She elected not to have a biopsy done. She was not started on steroids. Over the past 6 months her symptoms have continued to worsen. She now has short of breath with walking 30-40 feet on level ground. She cannot make it up one flight of stairs without getting short of breath. She does not have any chest pain, pressure, or tightness associated with her shortness of breath.    Past Medical History  Diagnosis Date  . Allergic rhinitis   . Noninfectious lymphedema   . Undiagnosed cardiac murmurs   . Mitral stenosis   . Morbid obesity   . Depressive disorder   . Menopausal symptoms   . Mitral and aortic heart valve diseases, unspecified   . Vitamin D deficiency   . HTN (hypertension)   . Hyperlipidemia   . CAO (chronic airflow obstruction)   . Gout   .  Hypertriglyceridemia   . Sleep apnea   . Kidney disease     Dr. Graylon Gunning    Past Surgical History  Procedure Laterality Date  . Video bronchoscopy Bilateral 10/25/2012    Procedure: VIDEO BRONCHOSCOPY WITH FLUORO;  Surgeon: Kathee Delton, MD;  Location: [facility];  Service: Cardiopulmonary;  Laterality: Bilateral;    Family History  Problem Relation Age of Onset  . Emphysema Mother   . Heart disease Father   . Kidney disease Father   . Cancer Mother     throat    Social History History  Substance Use Topics  . Smoking status: Current Every Day Smoker -- 0.50 packs/day for 35 years    Types: Cigarettes  . Smokeless tobacco: Never Used     Comment: pt has cut back from 1PPD to 1/2PPD since 07/2012  . Alcohol Use: No    Current Outpatient Prescriptions  Medication Sig Dispense Refill  . allopurinol (ZYLOPRIM) 300 MG tablet Take 300 mg by mouth daily.       Marland Kitchen aspirin 81 MG tablet Take 81 mg by mouth daily.      Marland Kitchen BENICAR HCT 40-25 MG per tablet Take 1 tablet by mouth daily.       Marland Kitchen buPROPion (WELLBUTRIN XL) 300 MG 24 hr tablet Take 300 mg by mouth every morning.       . Calcium Carbonate-Vitamin D (CALCIUM-VITAMIN D) 600-200 MG-UNIT CAPS Take 1 tablet by mouth 2 (two) times daily.      Marland Kitchen  Chlorpheniramine-DM (CORICIDIN COUGH/COLD) 4-30 MG TABS Take 1 tablet by mouth as needed.      . Cholecalciferol (VITAMIN D3) 2000 UNITS TABS Take 1 tablet by mouth daily.      . citalopram (CELEXA) 20 MG tablet Take 20 mg by mouth daily.       . fish oil-omega-3 fatty acids 1000 MG capsule Take 1 g by mouth daily.      . furosemide (LASIX) 40 MG tablet Take 40 mg by mouth daily.       Marland Kitchen KLOR-CON M20 20 MEQ tablet Take 20 mEq by mouth daily.       . metoprolol succinate (TOPROL-XL) 50 MG 24 hr tablet Take 50 mg by mouth daily.       . pravastatin (PRAVACHOL) 80 MG tablet Take 80 mg by mouth daily.       Marland Kitchen SPIRIVA HANDIHALER 18 MCG inhalation capsule Place 18 mcg into inhaler and inhale  daily.        No current facility-administered medications for this visit.    No Known Allergies  Review of Systems  Constitutional: Positive for activity change (Limited by shortness of breath), fatigue and unexpected weight change (Weight gain).  HENT: Positive for congestion and rhinorrhea.   Respiratory: Positive for cough, shortness of breath and wheezing.   Cardiovascular: Positive for palpitations and leg swelling (lymphedema). Negative for chest pain.  Musculoskeletal: Positive for arthralgias.  All other systems reviewed and are negative.    BP 146/80  Pulse 64  Resp 16  Ht 5\' 7"  (1.702 m)  Wt 320 lb (145.151 kg)  BMI 50.11 kg/m2  SpO2 93% Physical Exam  Vitals reviewed. Constitutional: She is oriented to person, place, and time.  Morbidly obese  HENT:  Head: Normocephalic and atraumatic.  Eyes: EOM are normal. Pupils are equal, round, and reactive to light.  Neck: Neck supple. No thyromegaly present.  Cardiovascular: Normal rate and regular rhythm.   Murmur (99991111 systolic murmur) heard. Pulmonary/Chest: She has wheezes.  Abdominal: Soft. There is no tenderness.  Musculoskeletal: She exhibits edema.  Lymphadenopathy:    She has no cervical adenopathy.  Neurological: She is alert and oriented to person, place, and time. No cranial nerve deficit.  Skin: Skin is warm and dry.     Diagnostic Tests: CT CHEST WITHOUT CONTRAST  TECHNIQUE:  Multidetector CT imaging of the chest was performed following the  standard protocol without IV contrast.  COMPARISON: 10/04/2012  FINDINGS:  Centrilobular micronodularity throughout the lungs bilaterally, most  of which measure 2-3 mm. This appearance suggests respiratory  bronchiolitis/RB-ILD or hypersensitivity pneumonitis. Miliary  infection is considered less likely.  Discrete 4 mm subpleural nodule in the superior segment left lower  lobe (series 3/image 28), grossly unchanged, likely benign.  Associated mosaic  attenuation, suggesting air trapping, with  possible mild centrilobular emphysematous changes. No pleural  effusion or pneumothorax.  Visualized thyroid is unremarkable.  The heart is top-normal in size. No pericardial effusion. Coronary  atherosclerosis. Atherosclerotic calcifications of the aortic arch.  Lungs 11 mm short axis right paratracheal node (series 2/ image 19),  with preservation of the normal fatty hilum.  Visualized upper abdomen is notable for mild hepatic steatosis.  Degenerative changes of the visualized thoracolumbar spine.  IMPRESSION:  Chronic centrilobular micronodularity throughout the lungs  bilaterally, suggesting respiratory bronchiolitis/RB-ILD or  hypersensitivity pneumonitis.  Additional 4 mm subpleural nodule in the left lower lobe, unchanged,  likely benign. 10.5 month stability has been demonstrated. This  approximates the  documentation of 12 month stability suggested by  Fleischner Society guidelines.  This recommendation follows the consensus statement: Guidelines for  Management of Small Pulmonary Nodules Detected on CT Scans: A  Statement from the [employer] as published in Radiology  2005; 237:395-400.  Electronically Signed  By: Julian Hy M.D.  On: 08/23/2013 16:47    Impression: 53 year old woman with persistent centrilobular micro-nodularity of the lungs. I saw her in June and recommended a lung biopsy at that time, but she was not rigid proceed with surgery. She now has had progressive worsening of her septum was over the past 10 months. Her scan has not progressed all that much, but also has not improved significantly during that time. She did cut down on her smoking but is still smoking about 10 cigarettes a day.  She does have a history of moderate aortic stenosis. She is followed by Dr. Gwenlyn Found for that. She's not sure when her next appointment is but thinks it should be same. The last echocardiogram I can find in Epic is from  April a year ago. I do think she needs to have a repeat echo probably should see Dr. Alvester Chou before surgery.  I recommended that we proceed with a left thoracoscopic lung biopsy. We'll try to remove the 4 mm subpleural nodule in the lower lobe as part of the biopsy procedure, but there is no guarantee that we will be able to localize the lesion. She understands that this is a diagnostic and therapeutic procedure.  We discussed the general nature of the operation including the use of general anesthesia, the incisions to be used, expected hospital stay, and the overall recovery. We discussed the indications, risks, benefits, and alternatives. She understands the risks include but are not limited to death, MI, DVT, PE, bleeding, possible need for transfusion, infection, irregular heart rhythms, prolonged air leaks, failure to make a diagnosis, as well as the possibility of unforeseeable complications.  She accepts the risks and wishes to proceed  Plan: Repeat echocardiogram and cardiology clearance per Dr. Gwenlyn Found  Left VATS, lung biopsy on Wednesday, April 6 2

## 2013-09-05 ENCOUNTER — Other Ambulatory Visit: Payer: Self-pay | Admitting: *Deleted

## 2013-09-05 DIAGNOSIS — I350 Nonrheumatic aortic (valve) stenosis: Secondary | ICD-10-CM

## 2013-09-12 ENCOUNTER — Ambulatory Visit (HOSPITAL_COMMUNITY)
Admission: RE | Admit: 2013-09-12 | Discharge: 2013-09-12 | Disposition: A | Discharge status: Home / Self Care | Payer: Medicare Other | Source: Ambulatory Visit | Attending: Cardiovascular Disease | Admitting: Cardiovascular Disease

## 2013-09-12 DIAGNOSIS — I350 Nonrheumatic aortic (valve) stenosis: Secondary | ICD-10-CM

## 2013-09-12 DIAGNOSIS — I359 Nonrheumatic aortic valve disorder, unspecified: Secondary | ICD-10-CM | POA: Insufficient documentation

## 2013-09-12 NOTE — Progress Notes (Signed)
2D Echo Performed 09/12/2013    [name], RCS

## 2013-09-20 ENCOUNTER — Encounter (HOSPITAL_COMMUNITY): Payer: Self-pay | Admitting: Pharmacy Technician

## 2013-10-01 ENCOUNTER — Encounter (HOSPITAL_COMMUNITY): Payer: Self-pay

## 2013-10-01 ENCOUNTER — Encounter (HOSPITAL_COMMUNITY)
Admission: RE | Admit: 2013-10-01 | Discharge: 2013-10-01 | Disposition: A | Discharge status: Home / Self Care | Payer: Medicare Other | Source: Ambulatory Visit | Attending: Thoracic Surgery (Cardiothoracic Vascular Surgery) | Admitting: Thoracic Surgery (Cardiothoracic Vascular Surgery)

## 2013-10-01 ENCOUNTER — Ambulatory Visit (HOSPITAL_COMMUNITY)
Admission: RE | Admit: 2013-10-01 | Discharge: 2013-10-01 | Disposition: A | Discharge status: Home / Self Care | Payer: Medicare Other | Source: Ambulatory Visit | Attending: Thoracic Surgery (Cardiothoracic Vascular Surgery) | Admitting: Thoracic Surgery (Cardiothoracic Vascular Surgery)

## 2013-10-01 VITALS — BP 124/77 | HR 76 | Temp 98.5°F | Resp 20 | Ht 66.5 in | Wt 314.2 lb

## 2013-10-01 DIAGNOSIS — Z01818 Encounter for other preprocedural examination: Secondary | ICD-10-CM | POA: Insufficient documentation

## 2013-10-01 DIAGNOSIS — Z01812 Encounter for preprocedural laboratory examination: Secondary | ICD-10-CM | POA: Insufficient documentation

## 2013-10-01 DIAGNOSIS — Z01810 Encounter for preprocedural cardiovascular examination: Secondary | ICD-10-CM | POA: Insufficient documentation

## 2013-10-01 DIAGNOSIS — Z87891 Personal history of nicotine dependence: Secondary | ICD-10-CM

## 2013-10-01 DIAGNOSIS — J8410 Pulmonary fibrosis, unspecified: Secondary | ICD-10-CM | POA: Insufficient documentation

## 2013-10-01 DIAGNOSIS — J9819 Other pulmonary collapse: Secondary | ICD-10-CM | POA: Diagnosis not present

## 2013-10-01 DIAGNOSIS — J849 Interstitial pulmonary disease, unspecified: Secondary | ICD-10-CM

## 2013-10-01 HISTORY — DX: Anxiety disorder, unspecified: F41.9

## 2013-10-01 HISTORY — DX: Unspecified osteoarthritis, unspecified site: M19.90

## 2013-10-01 LAB — COMPREHENSIVE METABOLIC PANEL
ALT: 24 U/L (ref 0–35)
AST: 27 U/L (ref 0–37)
Albumin: 4 g/dL (ref 3.5–5.2)
Alkaline Phosphatase: 71 U/L (ref 39–117)
BUN: 27 mg/dL — AB (ref 6–23)
CALCIUM: 10.5 mg/dL (ref 8.4–10.5)
CO2: 23 mEq/L (ref 19–32)
CREATININE: 1.14 mg/dL — AB (ref 0.50–1.10)
Chloride: 101 mEq/L (ref 96–112)
GFR calc Af Amer: 62 mL/min — ABNORMAL LOW (ref >90)
GFR, EST NON AFRICAN AMERICAN: 54 mL/min — AB (ref >90)
Glucose, Bld: 82 mg/dL (ref 70–99)
Potassium: 4.5 mEq/L (ref 3.7–5.3)
Sodium: 138 mEq/L (ref 137–147)
Total Bilirubin: 0.3 mg/dL (ref 0.3–1.2)
Total Protein: 7.9 g/dL (ref 6.0–8.3)

## 2013-10-01 LAB — CBC
HEMATOCRIT: 44.6 % (ref 36.0–46.0)
Hemoglobin: 15 g/dL (ref 12.0–15.0)
MCH: 32.3 pg (ref 26.0–34.0)
MCHC: 33.6 g/dL (ref 30.0–36.0)
MCV: 95.9 fL (ref 78.0–100.0)
Platelets: 220 10*3/uL (ref 150–400)
RBC: 4.65 MIL/uL (ref 3.87–5.11)
RDW: 14.3 % (ref 11.5–15.5)
WBC: 6.9 10*3/uL (ref 4.0–10.5)

## 2013-10-01 LAB — URINALYSIS, ROUTINE W REFLEX MICROSCOPIC
BILIRUBIN URINE: NEGATIVE
GLUCOSE, UA: NEGATIVE mg/dL
HGB URINE DIPSTICK: NEGATIVE
KETONES UR: NEGATIVE mg/dL
Leukocytes, UA: NEGATIVE
Nitrite: NEGATIVE
PH: 6 (ref 5.0–8.0)
Protein, ur: NEGATIVE mg/dL
Specific Gravity, Urine: 1.01 (ref 1.005–1.030)
Urobilinogen, UA: 0.2 mg/dL (ref 0.0–1.0)

## 2013-10-01 LAB — BLOOD GAS, ARTERIAL
ACID-BASE EXCESS: 2.2 mmol/L — AB (ref 0.0–2.0)
Bicarbonate: 26.7 mEq/L — ABNORMAL HIGH (ref 20.0–24.0)
Drawn by: 344381
FIO2: 0.21 %
O2 Saturation: 95.3 %
PCO2 ART: 45.1 mmHg — AB (ref 35.0–45.0)
Patient temperature: 98.6
TCO2: 28.1 mmol/L (ref 0–100)
pH, Arterial: 7.39 (ref 7.350–7.450)
pO2, Arterial: 74.3 mmHg — ABNORMAL LOW (ref 80.0–100.0)

## 2013-10-01 LAB — SURGICAL PCR SCREEN
MRSA, PCR: NEGATIVE
Staphylococcus aureus: NEGATIVE

## 2013-10-01 LAB — ABO/RH: ABO/RH(D): O POS

## 2013-10-01 LAB — PROTIME-INR
INR: 0.88 (ref 0.00–1.49)
PROTHROMBIN TIME: 11.8 s (ref 11.6–15.2)

## 2013-10-01 LAB — APTT: aPTT: 35 seconds (ref 24–37)

## 2013-10-01 NOTE — Pre-Procedure Instructions (Signed)
[name]  10/01/2013   Your procedure is scheduled on: Wednesday, May, 6, 2015 at 8:30 AM  Report to [facility] Short Stay (use Main Entrance "A'') at  6:30 AM.  Call this number if you have problems the morning of surgery: [phone]   Remember:   Do not eat food or drink liquids after midnight, Tuesday, Oct 02, 2013   Take these medicines the morning of surgery with A SIP OF WATER: Acetaminophen (TYLENOL ARTHRITIS PAIN PO), allopurinol (ZYLOPRIM), buPROPion (WELLBUTRIN XL,  citalopram (CELEXA), metoprolol succinate (TOPROL-XL), SPIRIVA HANDIHALER   Stop taking Aspirin, vitamins, and herbal medications (fish oil-omega-3 fatty acids)  Do not take any NSAIDs ie: Ibuprofen, Advil, Naproxen or any medication containing Aspirin.  Do not wear jewelry, make-up or nail polish.  Do not wear lotions, powders, or perfumes. You may  NOT wear deodorant.  Do not shave 48 hours prior to surgery.   Do not bring valuables to the hospital.  [facility] is not responsible for any belongings or valuables.               Contacts, dentures or bridgework may not be worn into surgery.  Leave suitcase in the car. After surgery it may be brought to your room.  For patients admitted to the hospital, discharge time is determined by your treatment team.               Patients discharged the day of surgery will not be allowed to drive home.  Name and phone number of your driver:   Special Instructions:  Special Instructions:Special Instructions: [facility] - Preparing for Surgery  Before surgery, you can play an important role.  Because skin is not sterile, your skin needs to be as free of germs as possible.  You can reduce the number of germs on you skin by washing with CHG (chlorahexidine gluconate) soap before surgery.  CHG is an antiseptic cleaner which kills germs and bonds with the skin to continue killing germs even after washing.  Please DO NOT use if you have an allergy to CHG or antibacterial  soaps.  If your skin becomes reddened/irritated stop using the CHG and inform your nurse when you arrive at Short Stay.  Do not shave (including legs and underarms) for at least 48 hours prior to the first CHG shower.  You may shave your face.  Please follow these instructions carefully:   1.  Shower with CHG Soap the night before surgery and the morning of Surgery.  2.  If you choose to wash your hair, wash your hair first as usual with your normal shampoo.  3.  After you shampoo, rinse your hair and body thoroughly to remove the Shampoo.  4.  Use CHG as you would any other liquid soap.  You can apply chg directly  to the skin and wash gently with scrungie or a clean washcloth.  5.  Apply the CHG Soap to your body ONLY FROM THE NECK DOWN.  Do not use on open wounds or open sores.  Avoid contact with your eyes, ears, mouth and genitals (private parts).  Wash genitals (private parts) with your normal soap.  6.  Wash thoroughly, paying special attention to the area where your surgery will be performed.  7.  Thoroughly rinse your body with warm water from the neck down.  8.  DO NOT shower/wash with your normal soap after using and rinsing off the CHG Soap.  9.  Pat yourself dry with  a clean towel.            10.  Wear clean pajamas.            11.  Place clean sheets on your bed the night of your first shower and do not sleep with pets.  Day of Surgery  Do not apply any lotions/deodorants the morning of surgery.  Please wear clean clothes to the hospital/surgery center.   Please read over the following fact sheets that you were given: Pain Booklet, Coughing and Deep Breathing, Blood Transfusion Information, MRSA Information and Surgical Site Infection Prevention

## 2013-10-01 NOTE — Progress Notes (Signed)
Pt denies chest pain and stated that she is scheduled  to see Dr. Gwenlyn Found ( cardiologist )  tomorrow 10/02/13.

## 2013-10-01 NOTE — Progress Notes (Signed)
10/01/13 1427  OBSTRUCTIVE SLEEP APNEA  Have you ever been diagnosed with sleep apnea through a sleep study? No  Do you snore loudly (loud enough to be heard through closed doors)?  1  Do you often feel tired, fatigued, or sleepy during the daytime? 0  Has anyone observed you stop breathing during your sleep? 0  Do you have, or are you being treated for high blood pressure? 1  BMI more than 35 kg/m2? 1  Age over 54 years old? 1  Neck circumference greater than 40 cm/16 inches? 1  Gender: 0  Obstructive Sleep Apnea Score 5  Score 4 or greater  Results sent to PCP

## 2013-10-02 ENCOUNTER — Ambulatory Visit (INDEPENDENT_AMBULATORY_CARE_PROVIDER_SITE_OTHER): Payer: Medicare Other | Admitting: Cardiovascular Disease

## 2013-10-02 ENCOUNTER — Ambulatory Visit: Payer: Medicare Other | Admitting: Thoracic Surgery (Cardiothoracic Vascular Surgery)

## 2013-10-02 ENCOUNTER — Encounter: Payer: Self-pay | Admitting: Cardiovascular Disease

## 2013-10-02 VITALS — BP 140/70 | HR 68 | Ht 66.5 in | Wt 318.5 lb

## 2013-10-02 DIAGNOSIS — F17200 Nicotine dependence, unspecified, uncomplicated: Secondary | ICD-10-CM | POA: Diagnosis not present

## 2013-10-02 DIAGNOSIS — E785 Hyperlipidemia, unspecified: Secondary | ICD-10-CM | POA: Diagnosis not present

## 2013-10-02 DIAGNOSIS — I10 Essential (primary) hypertension: Secondary | ICD-10-CM

## 2013-10-02 DIAGNOSIS — I359 Nonrheumatic aortic valve disorder, unspecified: Secondary | ICD-10-CM | POA: Diagnosis not present

## 2013-10-02 DIAGNOSIS — Z720 Tobacco use: Secondary | ICD-10-CM

## 2013-10-02 DIAGNOSIS — I350 Nonrheumatic aortic (valve) stenosis: Secondary | ICD-10-CM

## 2013-10-02 DIAGNOSIS — R918 Other nonspecific abnormal finding of lung field: Secondary | ICD-10-CM

## 2013-10-02 LAB — TYPE AND SCREEN
ABO/RH(D): O POS
Antibody Screen: NEGATIVE

## 2013-10-02 MED ORDER — DEXTROSE 5 % IV SOLN
1.5000 g | INTRAVENOUS | Status: AC
  Administered 2013-10-03: 1.5 g via INTRAVENOUS
  Filled 2013-10-02: qty 1.5

## 2013-10-02 NOTE — Assessment & Plan Note (Signed)
The patient was seen by Dr. Danton Sewer and Merilynn Finland. She had a negative bronchoscopy for evaluation of multiple nodules on chest CT scan. She scheduled for that procedure for diagnosis by Dr. Roxan Hockey and is being seen back here for cardiac clearance. She does have moderate aortic stenosis with a peak gradient of 70 but has increased since her echo one year ago. She has increasing dyspnea on exertion. She denies chest pain. Her last Myoview performed 4/14/he was nonischemic. She is at moderately increased risk from her surgical procedure given her moderate and he asked that I think she needs to have this performed cancer. If her pathology is negative we may need to further evaluate her aortic stenosis with a right and left heart catheterization.

## 2013-10-02 NOTE — Progress Notes (Signed)
10/02/2013 [name]   [birth year]  OZ:[id number]  Primary Physician [name], FNP Primary Cardiologist: Lorretta Harp MD Renae Gloss   HPI:  The patient is a 53 year old, severely overweight, widowed Caucasian female, mother of 1 child who I saw a year ago. 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 Her other problems include hypertension, hyperlipidemia and symptoms compatible with obstructive sleep apnea. She does smoke 10 cigarettes a day currently.Marland Kitchen Her most recent 2-D echocardiogram performed/15/15 revealed at least moderate to moderately severe aortic stenosis with a peak gradient of 70, mean of 43 a valve area of approximately 1 cm. The precordium is from 50-70 mm mercury over the last year. She does complain of increasing dyspnea on exertion. Unfortunately she also has had a CT scan of her chest that shows multiple nodules. She's had a negative bronchoscopy is scheduled for a VATS procedure by Dr. Roxan Hockey tomorrow.    Current Outpatient Prescriptions  Medication Sig Dispense Refill  . Acetaminophen (TYLENOL ARTHRITIS PAIN PO) Take 3 tablets by mouth 2 (two) times daily.      Marland Kitchen allopurinol (ZYLOPRIM) 300 MG tablet Take 300 mg by mouth daily.       Marland Kitchen aspirin 81 MG tablet Take 81 mg by mouth daily.      Marland Kitchen BENICAR HCT 40-25 MG per tablet Take 1 tablet by mouth daily.       Marland Kitchen buPROPion (WELLBUTRIN XL) 300 MG 24 hr tablet Take 300 mg by mouth every morning.       . Calcium Carbonate-Vitamin D (CALCIUM-VITAMIN D) 600-200 MG-UNIT CAPS Take 1 tablet by mouth 2 (two) times daily.      . Cholecalciferol (VITAMIN D3) 2000 UNITS TABS Take 2,000 Units by mouth daily.       . citalopram (CELEXA) 20 MG tablet Take 20 mg by mouth daily.       . fish oil-omega-3 fatty acids 1000 MG capsule Take 1 g by mouth daily.      . furosemide (LASIX) 40 MG tablet Take 40 mg by mouth daily.       Marland Kitchen KLOR-CON M20 20 MEQ tablet  Take 20 mEq by mouth daily.       . metoprolol succinate (TOPROL-XL) 50 MG 24 hr tablet Take 50 mg by mouth daily.       . pravastatin (PRAVACHOL) 80 MG tablet Take 80 mg by mouth daily.       Marland Kitchen SPIRIVA HANDIHALER 18 MCG inhalation capsule Place 18 mcg into inhaler and inhale daily.        No current facility-administered medications for this visit.    No Known Allergies  History   Social History  . Marital Status: Widowed    Spouse Name: N/A    Number of Children: 1  . Years of Education: N/A   Occupational History  . disabled    Social History Main Topics  . Smoking status: Current Every Day Smoker -- 0.50 packs/day for 35 years    Types: Cigarettes  . Smokeless tobacco: Never Used     Comment: pt has cut back from 1PPD to 1/2PPD since 07/2012  . Alcohol Use: No  . Drug Use: No  . Sexual Activity: Not on file   Other Topics Concern  . Not on file   Social History Narrative  . No narrative on file     Review of Systems: General: negative for chills, fever, night sweats or  weight changes.  Cardiovascular: negative for chest pain, dyspnea on exertion, edema, orthopnea, palpitations, paroxysmal nocturnal dyspnea or shortness of breath Dermatological: negative for rash Respiratory: negative for cough or wheezing Urologic: negative for hematuria Abdominal: negative for nausea, vomiting, diarrhea, bright red blood per rectum, melena, or hematemesis Neurologic: negative for visual changes, syncope, or dizziness All other systems reviewed and are otherwise negative except as noted above.    Blood pressure 140/70, pulse 68, height 5' 6.5" (1.689 m), weight 318 lb 8 oz (144.471 kg).  General appearance: alert and no distress Neck: no adenopathy, no JVD, supple, symmetrical, trachea midline, thyroid not enlarged, symmetric, no tenderness/mass/nodules and bilateral carotid bruits versus a transmitted murmur Lungs: clear to auscultation bilaterally Heart: 2/6 systolic ejection  murmur consistent with aortic stenosis Extremities: extremities normal, atraumatic, no cyanosis or edema  EKG not performed today  ASSESSMENT AND PLAN:   Pulmonary nodules The patient was seen by Dr. Danton Sewer and Merilynn Finland. She had a negative bronchoscopy for evaluation of multiple nodules on chest CT scan. She scheduled for that procedure for diagnosis by Dr. Roxan Hockey and is being seen back here for cardiac clearance. She does have moderate aortic stenosis with a peak gradient of 70 but has increased since her echo one year ago. She has increasing dyspnea on exertion. She denies chest pain. Her last Myoview performed 4/14/he was nonischemic. She is at moderately increased risk from her surgical procedure given her moderate and he asked that I think she needs to have this performed cancer. If her pathology is negative we may need to further evaluate her aortic stenosis with a right and left heart catheterization.  Aortic stenosis The patient has at least moderate aortic stenosis by 2-D echo. She also has mild to moderate mitral stenosis with a mean mitral valve gradient of 6 mm of mercury. While her aortic valve area calculates to approximately a centimeter squared her peak gradient has increased from 50-70 mm of mercury. If her vats procedure does not reveal cancer I suspect she should undergo right and left heart cath to further evaluate her anatomy and physiology.  Essential hypertension Controlled on current medications  Hyperlipidemia On statin therapy followed by her PCP      Lorretta Harp MD [facility], [facility] 10/02/2013 10:15 AM

## 2013-10-02 NOTE — Assessment & Plan Note (Signed)
Controlled on current medications 

## 2013-10-02 NOTE — Assessment & Plan Note (Signed)
On statin therapy followed by her PCP 

## 2013-10-02 NOTE — Patient Instructions (Addendum)
Your physician wants you to follow-up in: 3 months with Dr Gwenlyn Found. You will receive a reminder letter in the mail two months in advance. If you don't receive a letter, please call our office to schedule the follow-up appointment.   Dr Gwenlyn Found would like to do an echocardiogram in 3 months. Echocardiography is a painless test that uses sound waves to create images of your heart. It provides your doctor with information about the size and shape of your heart and how well your heart's chambers and valves are working. This procedure takes approximately one hour. There are no restrictions for this procedure.

## 2013-10-02 NOTE — Assessment & Plan Note (Signed)
The patient has at least moderate aortic stenosis by 2-D echo. She also has mild to moderate mitral stenosis with a mean mitral valve gradient of 6 mm of mercury. While her aortic valve area calculates to approximately a centimeter squared her peak gradient has increased from 50-70 mm of mercury. If her vats procedure does not reveal cancer I suspect she should undergo right and left heart cath to further evaluate her anatomy and physiology.

## 2013-10-03 ENCOUNTER — Encounter (HOSPITAL_COMMUNITY): Payer: Self-pay | Admitting: Anesthesiology

## 2013-10-03 ENCOUNTER — Encounter (HOSPITAL_COMMUNITY)
Admission: RE | Disposition: A | Discharge status: Home / Self Care | Payer: Self-pay | Source: Ambulatory Visit | Attending: Thoracic Surgery (Cardiothoracic Vascular Surgery)

## 2013-10-03 ENCOUNTER — Encounter (HOSPITAL_COMMUNITY): Payer: Medicare Other | Admitting: Anesthesiology

## 2013-10-03 ENCOUNTER — Inpatient Hospital Stay (HOSPITAL_COMMUNITY): Payer: Medicare Other

## 2013-10-03 ENCOUNTER — Inpatient Hospital Stay (HOSPITAL_COMMUNITY)
Admission: RE | Admit: 2013-10-03 | Discharge: 2013-10-07 | DRG: 166 | Disposition: A | Discharge status: Home / Self Care | Payer: Medicare Other | Source: Ambulatory Visit | Attending: Thoracic Surgery (Cardiothoracic Vascular Surgery) | Admitting: Thoracic Surgery (Cardiothoracic Vascular Surgery)

## 2013-10-03 ENCOUNTER — Inpatient Hospital Stay (HOSPITAL_COMMUNITY): Payer: Medicare Other | Admitting: Anesthesiology

## 2013-10-03 DIAGNOSIS — E785 Hyperlipidemia, unspecified: Secondary | ICD-10-CM | POA: Diagnosis present

## 2013-10-03 DIAGNOSIS — J9819 Other pulmonary collapse: Secondary | ICD-10-CM | POA: Diagnosis not present

## 2013-10-03 DIAGNOSIS — M109 Gout, unspecified: Secondary | ICD-10-CM | POA: Diagnosis present

## 2013-10-03 DIAGNOSIS — R0602 Shortness of breath: Secondary | ICD-10-CM | POA: Diagnosis not present

## 2013-10-03 DIAGNOSIS — G4730 Sleep apnea, unspecified: Secondary | ICD-10-CM | POA: Diagnosis present

## 2013-10-03 DIAGNOSIS — F329 Major depressive disorder, single episode, unspecified: Secondary | ICD-10-CM | POA: Diagnosis present

## 2013-10-03 DIAGNOSIS — I359 Nonrheumatic aortic valve disorder, unspecified: Secondary | ICD-10-CM | POA: Diagnosis present

## 2013-10-03 DIAGNOSIS — E781 Pure hyperglyceridemia: Secondary | ICD-10-CM | POA: Diagnosis present

## 2013-10-03 DIAGNOSIS — F3289 Other specified depressive episodes: Secondary | ICD-10-CM | POA: Diagnosis present

## 2013-10-03 DIAGNOSIS — I10 Essential (primary) hypertension: Secondary | ICD-10-CM | POA: Diagnosis present

## 2013-10-03 DIAGNOSIS — Z6841 Body Mass Index (BMI) 40.0 and over, adult: Secondary | ICD-10-CM | POA: Diagnosis not present

## 2013-10-03 DIAGNOSIS — Z7982 Long term (current) use of aspirin: Secondary | ICD-10-CM | POA: Diagnosis not present

## 2013-10-03 DIAGNOSIS — J849 Interstitial pulmonary disease, unspecified: Secondary | ICD-10-CM | POA: Diagnosis present

## 2013-10-03 DIAGNOSIS — J8410 Pulmonary fibrosis, unspecified: Secondary | ICD-10-CM | POA: Diagnosis not present

## 2013-10-03 DIAGNOSIS — J90 Pleural effusion, not elsewhere classified: Secondary | ICD-10-CM | POA: Diagnosis not present

## 2013-10-03 DIAGNOSIS — J679 Hypersensitivity pneumonitis due to unspecified organic dust: Secondary | ICD-10-CM | POA: Diagnosis present

## 2013-10-03 DIAGNOSIS — J4489 Other specified chronic obstructive pulmonary disease: Secondary | ICD-10-CM | POA: Diagnosis present

## 2013-10-03 DIAGNOSIS — Z452 Encounter for adjustment and management of vascular access device: Secondary | ICD-10-CM | POA: Diagnosis not present

## 2013-10-03 DIAGNOSIS — J9620 Acute and chronic respiratory failure, unspecified whether with hypoxia or hypercapnia: Secondary | ICD-10-CM | POA: Diagnosis present

## 2013-10-03 DIAGNOSIS — E559 Vitamin D deficiency, unspecified: Secondary | ICD-10-CM | POA: Diagnosis present

## 2013-10-03 DIAGNOSIS — J449 Chronic obstructive pulmonary disease, unspecified: Secondary | ICD-10-CM | POA: Diagnosis present

## 2013-10-03 DIAGNOSIS — Z4682 Encounter for fitting and adjustment of non-vascular catheter: Secondary | ICD-10-CM | POA: Diagnosis not present

## 2013-10-03 DIAGNOSIS — J984 Other disorders of lung: Secondary | ICD-10-CM | POA: Diagnosis present

## 2013-10-03 DIAGNOSIS — F17200 Nicotine dependence, unspecified, uncomplicated: Secondary | ICD-10-CM | POA: Diagnosis present

## 2013-10-03 DIAGNOSIS — R042 Hemoptysis: Secondary | ICD-10-CM | POA: Diagnosis not present

## 2013-10-03 DIAGNOSIS — R918 Other nonspecific abnormal finding of lung field: Secondary | ICD-10-CM | POA: Diagnosis not present

## 2013-10-03 HISTORY — PX: LUNG BIOPSY: SHX5088

## 2013-10-03 HISTORY — PX: VIDEO ASSISTED THORACOSCOPY: SHX5073

## 2013-10-03 SURGERY — VIDEO ASSISTED THORACOSCOPY
Anesthesia: General | Site: Chest | Laterality: Left

## 2013-10-03 MED ORDER — MIDAZOLAM HCL 2 MG/2ML IJ SOLN
INTRAMUSCULAR | Status: AC
  Filled 2013-10-03: qty 2

## 2013-10-03 MED ORDER — OXYCODONE HCL 5 MG/5ML PO SOLN: 5.0000 mg | Freq: Once | ORAL | Status: DC | PRN

## 2013-10-03 MED ORDER — PROPOFOL 10 MG/ML IV BOLUS
INTRAVENOUS | Status: AC
  Filled 2013-10-03: qty 20

## 2013-10-03 MED ORDER — FENTANYL CITRATE 0.05 MG/ML IJ SOLN
INTRAMUSCULAR | Status: DC | PRN
  Administered 2013-10-03: 50 ug via INTRAVENOUS
  Administered 2013-10-03: 100 ug via INTRAVENOUS
  Administered 2013-10-03 (×2): 50 ug via INTRAVENOUS

## 2013-10-03 MED ORDER — ROCURONIUM BROMIDE 50 MG/5ML IV SOLN
INTRAVENOUS | Status: AC
  Filled 2013-10-03: qty 1

## 2013-10-03 MED ORDER — NEOSTIGMINE METHYLSULFATE 10 MG/10ML IV SOLN
INTRAVENOUS | Status: AC
  Filled 2013-10-03: qty 1

## 2013-10-03 MED ORDER — ALLOPURINOL 300 MG PO TABS
300.0000 mg | ORAL_TABLET | Freq: Every day | ORAL | Status: DC
  Administered 2013-10-04 – 2013-10-07 (×4): 300 mg via ORAL
  Filled 2013-10-03 (×4): qty 1

## 2013-10-03 MED ORDER — OXYCODONE HCL 5 MG PO TABS: 5.0000 mg | ORAL_TABLET | Freq: Once | ORAL | Status: DC | PRN

## 2013-10-03 MED ORDER — NEOSTIGMINE METHYLSULFATE 10 MG/10ML IV SOLN
INTRAVENOUS | Status: DC | PRN
  Administered 2013-10-03: 3 mg via INTRAVENOUS

## 2013-10-03 MED ORDER — FENTANYL 10 MCG/ML IV SOLN
INTRAVENOUS | Status: DC
  Administered 2013-10-03 – 2013-10-04 (×5): 15 ug via INTRAVENOUS
  Administered 2013-10-04: 30 ug via INTRAVENOUS
  Administered 2013-10-04 (×2): 15 ug via INTRAVENOUS
  Administered 2013-10-04: 75 ug via INTRAVENOUS
  Administered 2013-10-05: 30 ug via INTRAVENOUS
  Administered 2013-10-05: 15 ug via INTRAVENOUS
  Filled 2013-10-03 (×2): qty 50

## 2013-10-03 MED ORDER — ONDANSETRON HCL 4 MG/2ML IJ SOLN: 4.0000 mg | Freq: Four times a day (QID) | INTRAMUSCULAR | Status: DC | PRN

## 2013-10-03 MED ORDER — SENNOSIDES-DOCUSATE SODIUM 8.6-50 MG PO TABS
1.0000 | ORAL_TABLET | Freq: Every day | ORAL | Status: DC
  Administered 2013-10-03 – 2013-10-05 (×3): 1 via ORAL
  Filled 2013-10-03 (×5): qty 1

## 2013-10-03 MED ORDER — ONDANSETRON HCL 4 MG/2ML IJ SOLN
INTRAMUSCULAR | Status: AC
  Filled 2013-10-03: qty 2

## 2013-10-03 MED ORDER — NALOXONE HCL 0.4 MG/ML IJ SOLN
0.4000 mg | INTRAMUSCULAR | Status: DC | PRN
  Filled 2013-10-03: qty 1

## 2013-10-03 MED ORDER — FENTANYL CITRATE 0.05 MG/ML IJ SOLN
INTRAMUSCULAR | Status: AC
  Filled 2013-10-03: qty 5

## 2013-10-03 MED ORDER — ASPIRIN EC 81 MG PO TBEC
81.0000 mg | DELAYED_RELEASE_TABLET | Freq: Every day | ORAL | Status: DC
  Administered 2013-10-04 – 2013-10-07 (×4): 81 mg via ORAL
  Filled 2013-10-03 (×4): qty 1

## 2013-10-03 MED ORDER — HYDROMORPHONE HCL PF 1 MG/ML IJ SOLN
0.2500 mg | INTRAMUSCULAR | Status: DC | PRN
  Administered 2013-10-03 (×2): 0.5 mg via INTRAVENOUS

## 2013-10-03 MED ORDER — TIOTROPIUM BROMIDE MONOHYDRATE 18 MCG IN CAPS
18.0000 ug | ORAL_CAPSULE | Freq: Every day | RESPIRATORY_TRACT | Status: DC
  Administered 2013-10-04 – 2013-10-07 (×3): 18 ug via RESPIRATORY_TRACT
  Filled 2013-10-03 (×3): qty 5

## 2013-10-03 MED ORDER — POTASSIUM CHLORIDE 10 MEQ/50ML IV SOLN
10.0000 meq | Freq: Every day | INTRAVENOUS | Status: DC | PRN
  Filled 2013-10-03: qty 50

## 2013-10-03 MED ORDER — 0.9 % SODIUM CHLORIDE (POUR BTL) OPTIME
TOPICAL | Status: DC | PRN
  Administered 2013-10-03 (×2): 1000 mL

## 2013-10-03 MED ORDER — ONDANSETRON HCL 4 MG/2ML IJ SOLN
INTRAMUSCULAR | Status: DC | PRN
  Administered 2013-10-03: 4 mg via INTRAVENOUS

## 2013-10-03 MED ORDER — BUPROPION HCL ER (XL) 300 MG PO TB24
300.0000 mg | ORAL_TABLET | Freq: Every day | ORAL | Status: DC
  Administered 2013-10-04 – 2013-10-07 (×4): 300 mg via ORAL
  Filled 2013-10-03 (×4): qty 1

## 2013-10-03 MED ORDER — ALBUTEROL SULFATE (2.5 MG/3ML) 0.083% IN NEBU
INHALATION_SOLUTION | RESPIRATORY_TRACT | Status: AC
  Filled 2013-10-03: qty 3

## 2013-10-03 MED ORDER — MIDAZOLAM HCL 5 MG/5ML IJ SOLN
INTRAMUSCULAR | Status: DC | PRN
  Administered 2013-10-03: 2 mg via INTRAVENOUS

## 2013-10-03 MED ORDER — CITALOPRAM HYDROBROMIDE 20 MG PO TABS
20.0000 mg | ORAL_TABLET | Freq: Every day | ORAL | Status: DC
  Administered 2013-10-04 – 2013-10-07 (×4): 20 mg via ORAL
  Filled 2013-10-03 (×4): qty 1

## 2013-10-03 MED ORDER — ONDANSETRON HCL 4 MG/2ML IJ SOLN: 4.0000 mg | Freq: Once | INTRAMUSCULAR | Status: DC | PRN

## 2013-10-03 MED ORDER — SODIUM CHLORIDE 0.9 % IJ SOLN: 9.0000 mL | INTRAMUSCULAR | Status: DC | PRN

## 2013-10-03 MED ORDER — DIPHENHYDRAMINE HCL 12.5 MG/5ML PO ELIX
12.5000 mg | ORAL_SOLUTION | Freq: Four times a day (QID) | ORAL | Status: DC | PRN
  Filled 2013-10-03: qty 5

## 2013-10-03 MED ORDER — TRAMADOL HCL 50 MG PO TABS: 50.0000 mg | ORAL_TABLET | Freq: Four times a day (QID) | ORAL | Status: DC | PRN

## 2013-10-03 MED ORDER — SIMVASTATIN 40 MG PO TABS
40.0000 mg | ORAL_TABLET | Freq: Every day | ORAL | Status: DC
  Administered 2013-10-03 – 2013-10-06 (×4): 40 mg via ORAL
  Filled 2013-10-03 (×5): qty 1

## 2013-10-03 MED ORDER — GLYCOPYRROLATE 0.2 MG/ML IJ SOLN
INTRAMUSCULAR | Status: AC
  Filled 2013-10-03: qty 2

## 2013-10-03 MED ORDER — DEXTROSE 5 % IV SOLN
1.5000 g | Freq: Two times a day (BID) | INTRAVENOUS | Status: AC
  Administered 2013-10-03 – 2013-10-04 (×2): 1.5 g via INTRAVENOUS
  Filled 2013-10-03 (×2): qty 1.5

## 2013-10-03 MED ORDER — HYDROMORPHONE HCL PF 1 MG/ML IJ SOLN
INTRAMUSCULAR | Status: AC
  Filled 2013-10-03: qty 1

## 2013-10-03 MED ORDER — DEXTROSE-NACL 5-0.9 % IV SOLN
INTRAVENOUS | Status: DC
  Administered 2013-10-03 – 2013-10-04 (×2): 100 mL/h via INTRAVENOUS
  Administered 2013-10-05: 1 mL via INTRAVENOUS

## 2013-10-03 MED ORDER — BISACODYL 5 MG PO TBEC
10.0000 mg | DELAYED_RELEASE_TABLET | Freq: Every day | ORAL | Status: DC
  Administered 2013-10-03 – 2013-10-05 (×3): 10 mg via ORAL
  Filled 2013-10-03 (×3): qty 2

## 2013-10-03 MED ORDER — PROPOFOL 10 MG/ML IV BOLUS
INTRAVENOUS | Status: DC | PRN
  Administered 2013-10-03: 130 mg via INTRAVENOUS

## 2013-10-03 MED ORDER — ACETAMINOPHEN 500 MG PO TABS
1000.0000 mg | ORAL_TABLET | Freq: Four times a day (QID) | ORAL | Status: DC
  Administered 2013-10-03 – 2013-10-07 (×15): 1000 mg via ORAL
  Filled 2013-10-03 (×19): qty 2

## 2013-10-03 MED ORDER — ACETAMINOPHEN 160 MG/5ML PO SOLN
1000.0000 mg | Freq: Four times a day (QID) | ORAL | Status: DC
  Filled 2013-10-03: qty 40

## 2013-10-03 MED ORDER — LACTATED RINGERS IV SOLN
INTRAVENOUS | Status: DC | PRN
  Administered 2013-10-03: 08:00:00 via INTRAVENOUS

## 2013-10-03 MED ORDER — OXYCODONE HCL 5 MG PO TABS: 5.0000 mg | ORAL_TABLET | ORAL | Status: DC | PRN

## 2013-10-03 MED ORDER — DIPHENHYDRAMINE HCL 50 MG/ML IJ SOLN
12.5000 mg | Freq: Four times a day (QID) | INTRAMUSCULAR | Status: DC | PRN
  Filled 2013-10-03: qty 0.25

## 2013-10-03 MED ORDER — ROCURONIUM BROMIDE 100 MG/10ML IV SOLN
INTRAVENOUS | Status: DC | PRN
  Administered 2013-10-03: 50 mg via INTRAVENOUS

## 2013-10-03 MED ORDER — GLYCOPYRROLATE 0.2 MG/ML IJ SOLN
INTRAMUSCULAR | Status: DC | PRN
  Administered 2013-10-03: 0.4 mg via INTRAVENOUS

## 2013-10-03 MED ORDER — METOPROLOL SUCCINATE ER 50 MG PO TB24
50.0000 mg | ORAL_TABLET | Freq: Every day | ORAL | Status: DC
  Administered 2013-10-04 – 2013-10-07 (×4): 50 mg via ORAL
  Filled 2013-10-03 (×4): qty 1

## 2013-10-03 SURGICAL SUPPLY — 78 items
APPLIER CLIP ROT 10 11.4 M/L (STAPLE)
BLADE 10 SAFETY STRL DISP (BLADE) ×2 IMPLANT
CANISTER SUCTION 2500CC (MISCELLANEOUS) ×2 IMPLANT
CATH KIT ON Q 5IN SLV (PAIN MANAGEMENT) IMPLANT
CATH THORACIC 28FR (CATHETERS) ×2 IMPLANT
CATH THORACIC 28FR RT ANG (CATHETERS) IMPLANT
CATH THORACIC 36FR (CATHETERS) IMPLANT
CATH THORACIC 36FR RT ANG (CATHETERS) IMPLANT
CLIP APPLIE ROT 10 11.4 M/L (STAPLE) IMPLANT
CLIP TI MEDIUM 6 (CLIP) ×2 IMPLANT
CONN Y 3/8X3/8X3/8  BEN (MISCELLANEOUS) ×1
CONN Y 3/8X3/8X3/8 BEN (MISCELLANEOUS) ×1 IMPLANT
CONT SPEC 4OZ CLIKSEAL STRL BL (MISCELLANEOUS) ×4 IMPLANT
COVER SURGICAL LIGHT HANDLE (MISCELLANEOUS) ×2 IMPLANT
DERMABOND ADHESIVE PROPEN (GAUZE/BANDAGES/DRESSINGS) ×1
DERMABOND ADVANCED (GAUZE/BANDAGES/DRESSINGS) ×1
DERMABOND ADVANCED .7 DNX12 (GAUZE/BANDAGES/DRESSINGS) ×1 IMPLANT
DERMABOND ADVANCED .7 DNX6 (GAUZE/BANDAGES/DRESSINGS) ×1 IMPLANT
DRAPE LAPAROSCOPIC ABDOMINAL (DRAPES) ×2 IMPLANT
DRAPE WARM FLUID 44X44 (DRAPE) ×2 IMPLANT
ELECT REM PT RETURN 9FT ADLT (ELECTROSURGICAL) ×2
ELECTRODE REM PT RTRN 9FT ADLT (ELECTROSURGICAL) ×1 IMPLANT
GLOVE BIO SURGEON STRL SZ 6.5 (GLOVE) ×4 IMPLANT
GLOVE BIO SURGEON STRL SZ7.5 (GLOVE) ×2 IMPLANT
GLOVE SKINSENSE NS SZ7.0 (GLOVE) ×1
GLOVE SKINSENSE STRL SZ7.0 (GLOVE) ×1 IMPLANT
GLOVE SURG SIGNA 7.5 PF LTX (GLOVE) ×4 IMPLANT
GOWN STRL REUS W/ TWL LRG LVL3 (GOWN DISPOSABLE) ×2 IMPLANT
GOWN STRL REUS W/ TWL XL LVL3 (GOWN DISPOSABLE) ×2 IMPLANT
GOWN STRL REUS W/TWL LRG LVL3 (GOWN DISPOSABLE) ×2
GOWN STRL REUS W/TWL XL LVL3 (GOWN DISPOSABLE) ×2
HANDLE STAPLE ENDO GIA SHORT (STAPLE) ×1
HEMOSTAT SURGICEL 2X14 (HEMOSTASIS) IMPLANT
KIT BASIN OR (CUSTOM PROCEDURE TRAY) ×2 IMPLANT
KIT ROOM TURNOVER OR (KITS) ×2 IMPLANT
KIT SUCTION CATH 14FR (SUCTIONS) ×2 IMPLANT
NS IRRIG 1000ML POUR BTL (IV SOLUTION) ×4 IMPLANT
PACK CHEST (CUSTOM PROCEDURE TRAY) ×2 IMPLANT
PAD ARMBOARD 7.5X6 YLW CONV (MISCELLANEOUS) ×6 IMPLANT
POUCH ENDO CATCH II 15MM (MISCELLANEOUS) IMPLANT
POUCH SPECIMEN RETRIEVAL 10MM (ENDOMECHANICALS) IMPLANT
RELOAD EGIA 45 MED/THCK PURPLE (STAPLE) ×8 IMPLANT
RELOAD EGIA 60 MED/THCK PURPLE (STAPLE) ×8 IMPLANT
SEALANT PROGEL (MISCELLANEOUS) IMPLANT
SEALANT SURG COSEAL 4ML (VASCULAR PRODUCTS) IMPLANT
SEALANT SURG COSEAL 8ML (VASCULAR PRODUCTS) IMPLANT
SOLUTION ANTI FOG 6CC (MISCELLANEOUS) ×2 IMPLANT
SPECIMEN JAR MEDIUM (MISCELLANEOUS) ×2 IMPLANT
SPONGE GAUZE 4X4 12PLY (GAUZE/BANDAGES/DRESSINGS) ×2 IMPLANT
SPONGE INTESTINAL PEANUT (DISPOSABLE) IMPLANT
STAPLER ENDO GIA 12MM SHORT (STAPLE) ×1 IMPLANT
SUT PROLENE 4 0 RB 1 (SUTURE)
SUT PROLENE 4-0 RB1 .5 CRCL 36 (SUTURE) IMPLANT
SUT SILK  1 MH (SUTURE) ×2
SUT SILK 1 MH (SUTURE) ×2 IMPLANT
SUT SILK 2 0SH CR/8 30 (SUTURE) IMPLANT
SUT SILK 3 0SH CR/8 30 (SUTURE) ×2 IMPLANT
SUT VIC AB 1 CTX 36 (SUTURE)
SUT VIC AB 1 CTX36XBRD ANBCTR (SUTURE) IMPLANT
SUT VIC AB 2-0 CTX 36 (SUTURE) ×4 IMPLANT
SUT VIC AB 2-0 UR6 27 (SUTURE) IMPLANT
SUT VIC AB 3-0 MH 27 (SUTURE) IMPLANT
SUT VIC AB 3-0 X1 27 (SUTURE) ×2 IMPLANT
SUT VICRYL 2 TP 1 (SUTURE) IMPLANT
SWAB COLLECTION DEVICE MRSA (MISCELLANEOUS) IMPLANT
SYSTEM SAHARA CHEST DRAIN ATS (WOUND CARE) ×2 IMPLANT
TAPE CLOTH 4X10 WHT NS (GAUZE/BANDAGES/DRESSINGS) ×2 IMPLANT
TAPE CLOTH SURG 4X10 WHT LF (GAUZE/BANDAGES/DRESSINGS) ×2 IMPLANT
TIP APPLICATOR SPRAY EXTEND 16 (VASCULAR PRODUCTS) IMPLANT
TOWEL OR 17X24 6PK STRL BLUE (TOWEL DISPOSABLE) ×2 IMPLANT
TOWEL OR 17X26 10 PK STRL BLUE (TOWEL DISPOSABLE) ×4 IMPLANT
TRAP SPECIMEN MUCOUS 40CC (MISCELLANEOUS) IMPLANT
TRAY FOLEY CATH 16FRSI W/METER (SET/KITS/TRAYS/PACK) ×2 IMPLANT
TROCAR XCEL NON-BLD 5MMX100MML (ENDOMECHANICALS) IMPLANT
TUBE ANAEROBIC SPECIMEN COL (MISCELLANEOUS) IMPLANT
TUNNELER SHEATH ON-Q 11GX8 DSP (PAIN MANAGEMENT) IMPLANT
WATER STERILE IRR 1000ML POUR (IV SOLUTION) ×4 IMPLANT
YANKAUER SUCT BULB TIP NO VENT (SUCTIONS) ×2 IMPLANT

## 2013-10-03 NOTE — Transfer of Care (Signed)
Immediate Anesthesia Transfer of Care Note  Patient: [name]  Procedure(s) Performed: Procedure(s): Left Video Assited Thoracoscopy (Left) Left Lung Biopsy (Left)  Patient Location: PACU  Anesthesia Type:General  Level of Consciousness: awake, alert  and oriented  Airway & Oxygen Therapy: Patient Spontanous Breathing and Patient connected to face mask oxygen  Post-op Assessment: Report given to PACU RN and Post -op Vital signs reviewed and stable  Post vital signs: Reviewed and stable  Complications: No apparent anesthesia complications

## 2013-10-03 NOTE — Progress Notes (Signed)
T[facility] BRIEF SICU PROGRESS NOTE  Day of Surgery  S/P Procedure(s) (LRB): Left Video Assited Thoracoscopy (Left) Left Lung Biopsy (Left)   Mild soreness, good analgesia Breathing comfortably w/ O2 sats 100% Minimal chest tube output, no air leak  Plan: Continue routine early postop  Rexene Alberts 10/03/2013 7:44 PM

## 2013-10-03 NOTE — H&P (View-Only) (Signed)
PCP is [name], FNP Referring Provider is Clance, Armando Reichert, MD  Chief Complaint  Patient presents with  . Lung Lesion    multiple...reassess for biopsy,,,recent CT CHEST    HPI: [name] is a 53 year old woman sent for consultation regarding a lung biopsy for diffuse bilateral pulmonary nodules.   [name] is a 53 year old woman with a history of smoking, obesity, and moderate aortic stenosis. She also has diffuse bilateral pulmonary micronodules. She has been experiencing increasing shortness of breath with exertion for the past couple of years. She will note shortness of breath with walking up a flight of stairs or walking about 100 feet on level ground. She also has wheezing and occasional dry cough. She has not had any hemoptysis.   She denies fevers, chills, weight loss. She has gained some weight. She does not have any known exposures to birds, travel, Architect sites.   Dr. Gwenette Greet did a bronchoscopy with biopsy, but it revealed only benign lung tissue with hemosiderin laden macrophages. She was offered the option of conservative management with smoking cessation and steroids versus surgical lung biopsy.   She elected not to have a biopsy done. She was not started on steroids. Over the past 6 months her symptoms have continued to worsen. She now has short of breath with walking 30-40 feet on level ground. She cannot make it up one flight of stairs without getting short of breath. She does not have any chest pain, pressure, or tightness associated with her shortness of breath.    Past Medical History  Diagnosis Date  . Allergic rhinitis   . Noninfectious lymphedema   . Undiagnosed cardiac murmurs   . Mitral stenosis   . Morbid obesity   . Depressive disorder   . Menopausal symptoms   . Mitral and aortic heart valve diseases, unspecified   . Vitamin D deficiency   . HTN (hypertension)   . Hyperlipidemia   . CAO (chronic airflow obstruction)   . Gout   .  Hypertriglyceridemia   . Sleep apnea   . Kidney disease     Dr. Graylon Gunning    Past Surgical History  Procedure Laterality Date  . Video bronchoscopy Bilateral 10/25/2012    Procedure: VIDEO BRONCHOSCOPY WITH FLUORO;  Surgeon: Kathee Delton, MD;  Location: [facility];  Service: Cardiopulmonary;  Laterality: Bilateral;    Family History  Problem Relation Age of Onset  . Emphysema Mother   . Heart disease Father   . Kidney disease Father   . Cancer Mother     throat    Social History History  Substance Use Topics  . Smoking status: Current Every Day Smoker -- 0.50 packs/day for 35 years    Types: Cigarettes  . Smokeless tobacco: Never Used     Comment: pt has cut back from 1PPD to 1/2PPD since 07/2012  . Alcohol Use: No    Current Outpatient Prescriptions  Medication Sig Dispense Refill  . allopurinol (ZYLOPRIM) 300 MG tablet Take 300 mg by mouth daily.       Marland Kitchen aspirin 81 MG tablet Take 81 mg by mouth daily.      Marland Kitchen BENICAR HCT 40-25 MG per tablet Take 1 tablet by mouth daily.       Marland Kitchen buPROPion (WELLBUTRIN XL) 300 MG 24 hr tablet Take 300 mg by mouth every morning.       . Calcium Carbonate-Vitamin D (CALCIUM-VITAMIN D) 600-200 MG-UNIT CAPS Take 1 tablet by mouth 2 (two) times daily.      Marland Kitchen  Chlorpheniramine-DM (CORICIDIN COUGH/COLD) 4-30 MG TABS Take 1 tablet by mouth as needed.      . Cholecalciferol (VITAMIN D3) 2000 UNITS TABS Take 1 tablet by mouth daily.      . citalopram (CELEXA) 20 MG tablet Take 20 mg by mouth daily.       . fish oil-omega-3 fatty acids 1000 MG capsule Take 1 g by mouth daily.      . furosemide (LASIX) 40 MG tablet Take 40 mg by mouth daily.       Marland Kitchen KLOR-CON M20 20 MEQ tablet Take 20 mEq by mouth daily.       . metoprolol succinate (TOPROL-XL) 50 MG 24 hr tablet Take 50 mg by mouth daily.       . pravastatin (PRAVACHOL) 80 MG tablet Take 80 mg by mouth daily.       Marland Kitchen SPIRIVA HANDIHALER 18 MCG inhalation capsule Place 18 mcg into inhaler and inhale  daily.        No current facility-administered medications for this visit.    No Known Allergies  Review of Systems  Constitutional: Positive for activity change (Limited by shortness of breath), fatigue and unexpected weight change (Weight gain).  HENT: Positive for congestion and rhinorrhea.   Respiratory: Positive for cough, shortness of breath and wheezing.   Cardiovascular: Positive for palpitations and leg swelling (lymphedema). Negative for chest pain.  Musculoskeletal: Positive for arthralgias.  All other systems reviewed and are negative.    BP 146/80  Pulse 64  Resp 16  Ht 5\' 7"  (1.702 m)  Wt 320 lb (145.151 kg)  BMI 50.11 kg/m2  SpO2 93% Physical Exam  Vitals reviewed. Constitutional: She is oriented to person, place, and time.  Morbidly obese  HENT:  Head: Normocephalic and atraumatic.  Eyes: EOM are normal. Pupils are equal, round, and reactive to light.  Neck: Neck supple. No thyromegaly present.  Cardiovascular: Normal rate and regular rhythm.   Murmur (99991111 systolic murmur) heard. Pulmonary/Chest: She has wheezes.  Abdominal: Soft. There is no tenderness.  Musculoskeletal: She exhibits edema.  Lymphadenopathy:    She has no cervical adenopathy.  Neurological: She is alert and oriented to person, place, and time. No cranial nerve deficit.  Skin: Skin is warm and dry.     Diagnostic Tests: CT CHEST WITHOUT CONTRAST  TECHNIQUE:  Multidetector CT imaging of the chest was performed following the  standard protocol without IV contrast.  COMPARISON: 10/04/2012  FINDINGS:  Centrilobular micronodularity throughout the lungs bilaterally, most  of which measure 2-3 mm. This appearance suggests respiratory  bronchiolitis/RB-ILD or hypersensitivity pneumonitis. Miliary  infection is considered less likely.  Discrete 4 mm subpleural nodule in the superior segment left lower  lobe (series 3/image 28), grossly unchanged, likely benign.  Associated mosaic  attenuation, suggesting air trapping, with  possible mild centrilobular emphysematous changes. No pleural  effusion or pneumothorax.  Visualized thyroid is unremarkable.  The heart is top-normal in size. No pericardial effusion. Coronary  atherosclerosis. Atherosclerotic calcifications of the aortic arch.  Lungs 11 mm short axis right paratracheal node (series 2/ image 19),  with preservation of the normal fatty hilum.  Visualized upper abdomen is notable for mild hepatic steatosis.  Degenerative changes of the visualized thoracolumbar spine.  IMPRESSION:  Chronic centrilobular micronodularity throughout the lungs  bilaterally, suggesting respiratory bronchiolitis/RB-ILD or  hypersensitivity pneumonitis.  Additional 4 mm subpleural nodule in the left lower lobe, unchanged,  likely benign. 10.5 month stability has been demonstrated. This  approximates the  documentation of 12 month stability suggested by  Fleischner Society guidelines.  This recommendation follows the consensus statement: Guidelines for  Management of Small Pulmonary Nodules Detected on CT Scans: A  Statement from the [employer] as published in Radiology  2005; 237:395-400.  Electronically Signed  By: Julian Hy M.D.  On: 08/23/2013 16:47    Impression: 53 year old woman with persistent centrilobular micro-nodularity of the lungs. I saw her in June and recommended a lung biopsy at that time, but she was not rigid proceed with surgery. She now has had progressive worsening of her septum was over the past 10 months. Her scan has not progressed all that much, but also has not improved significantly during that time. She did cut down on her smoking but is still smoking about 10 cigarettes a day.  She does have a history of moderate aortic stenosis. She is followed by Dr. Gwenlyn Found for that. She's not sure when her next appointment is but thinks it should be same. The last echocardiogram I can find in Epic is from  April a year ago. I do think she needs to have a repeat echo probably should see Dr. Alvester Chou before surgery.  I recommended that we proceed with a left thoracoscopic lung biopsy. We'll try to remove the 4 mm subpleural nodule in the lower lobe as part of the biopsy procedure, but there is no guarantee that we will be able to localize the lesion. She understands that this is a diagnostic and therapeutic procedure.  We discussed the general nature of the operation including the use of general anesthesia, the incisions to be used, expected hospital stay, and the overall recovery. We discussed the indications, risks, benefits, and alternatives. She understands the risks include but are not limited to death, MI, DVT, PE, bleeding, possible need for transfusion, infection, irregular heart rhythms, prolonged air leaks, failure to make a diagnosis, as well as the possibility of unforeseeable complications.  She accepts the risks and wishes to proceed  Plan: Repeat echocardiogram and cardiology clearance per Dr. Gwenlyn Found  Left VATS, lung biopsy on Wednesday, April 6 2

## 2013-10-03 NOTE — Progress Notes (Signed)
   Intra-op images

## 2013-10-03 NOTE — Brief Op Note (Addendum)
      [address]       [place] [address]             [phone]     10/03/2013  10:05 AM  PATIENT:  [name]  53 y.o. female  PRE-OPERATIVE DIAGNOSIS:  ILD  POST-OPERATIVE DIAGNOSIS:  interstitial lung disease  PROCEDURE:  Procedure(s): Left Video Assited Thoracoscopy Left Lung wedge resection x 3  SURGEON:  Surgeon(s): Melrose Nakayama, MD  PHYSICIAN ASSISTANT: Jadene Pierini PA-C  ANESTHESIA:   general  SPECIMEN:  Source of Specimen:  LUL AND LLL WEDGE RESECTIONS  DISPOSITION OF SPECIMEN:  Pathology  DRAINS: 1  Chest Tube(s) in the LEFT HEMITHORAX   PATIENT CONDITION:  PACU - hemodynamically stable.  PRE-OPERATIVE WEIGHT: 144kg  Lung diffusely diseased, unable to palpate posterior LLL nodule  FROZEN: interstitial lung dz

## 2013-10-03 NOTE — Anesthesia Postprocedure Evaluation (Signed)
  Anesthesia Post-op Note  Patient: [name]  Procedure(s) Performed: Procedure(s): Left Video Assited Thoracoscopy (Left) Left Lung Biopsy (Left)  Patient Location: PACU  Anesthesia Type:General  Level of Consciousness: awake, alert  and oriented  Airway and Oxygen Therapy: Patient Spontanous Breathing and Patient connected to nasal cannula oxygen  Post-op Pain: mild  Post-op Assessment: Post-op Vital signs reviewed, Patient's Cardiovascular Status Stable, Respiratory Function Stable, Patent Airway and Pain level controlled  Post-op Vital Signs: stable  Last Vitals:  Filed Vitals:   10/03/13 1430  BP:   Pulse: 72  Temp:   Resp: 14    Complications: No apparent anesthesia complications

## 2013-10-03 NOTE — Progress Notes (Signed)
Utilization Review Completed.Shreya Lacasse T Dowell5/10/2013  

## 2013-10-03 NOTE — Progress Notes (Signed)
Anesthesiology:  Post-op CXR shows L.IJ catheter extending straight into the chest over the aortic arch. Good blood return noted, good CVP waveform with pressure transducer.   No R.IJ vein noted on ultrasound during line placement. 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  Roberts Gaudy, MD

## 2013-10-03 NOTE — Interval H&P Note (Signed)
History and Physical Interval Note:  Cleared for surgery but at higher risk due to AS 10/03/2013 8:05 AM  [name]  has presented today for surgery, with the diagnosis of ILD  The various methods of treatment have been discussed with the patient and family. After consideration of risks, benefits and other options for treatment, the patient has consented to  Procedure(s): VIDEO ASSISTED THORACOSCOPY (Left) LUNG BIOPSY (Left) as a surgical intervention .  The patient's history has been reviewed, patient examined, no change in status, stable for surgery.  I have reviewed the patient's chart and labs.  Questions were answered to the patient's satisfaction.     Melrose Nakayama

## 2013-10-03 NOTE — Anesthesia Preprocedure Evaluation (Addendum)
Anesthesia Evaluation  Patient identified by MRN, date of birth, ID band Patient awake    Reviewed: Allergy & Precautions, H&P , NPO status , Patient's Chart, lab work & pertinent test results, reviewed documented beta blocker date and time   Airway Mallampati: II TM Distance: >3 FB     Dental  (+) Edentulous Upper, Dental Advisory Given   Pulmonary shortness of breath, asthma , COPDCurrent Smoker,  breath sounds clear to auscultation        Cardiovascular hypertension, + Valvular Problems/Murmurs AS Rhythm:Regular Rate:Normal     Neuro/Psych PSYCHIATRIC DISORDERS Anxiety Depression    GI/Hepatic   Endo/Other  Morbid obesity  Renal/GU      Musculoskeletal   Abdominal   Peds  Hematology   Anesthesia Other Findings   Reproductive/Obstetrics                          Anesthesia Physical Anesthesia Plan  ASA: III  Anesthesia Plan: General   Post-op Pain Management:    Induction: Intravenous  Airway Management Planned: Oral ETT and Double Lumen EBT  Additional Equipment: Arterial line and CVP  Intra-op Plan:   Post-operative Plan:   Informed Consent: I have reviewed the patients History and Physical, chart, labs and discussed the procedure including the risks, benefits and alternatives for the proposed anesthesia with the patient or authorized representative who has indicated his/her understanding and acceptance.   Dental advisory given  Plan Discussed with: CRNA and Anesthesiologist  Anesthesia Plan Comments: (Bilat lung nodules wit RAD Moderate to severe AS Htn Smoker Obesity)        Anesthesia Quick Evaluation

## 2013-10-03 NOTE — Anesthesia Procedure Notes (Addendum)
Procedure Name: Intubation Date/Time: 10/03/2013 8:44 AM Performed by: Manuela Schwartz B Pre-anesthesia Checklist: Patient identified, Emergency Drugs available, Suction available, Patient being monitored and Timeout performed Patient Re-evaluated:Patient Re-evaluated prior to inductionOxygen Delivery Method: Circle system utilized Preoxygenation: Pre-oxygenation with 100% oxygen Intubation Type: IV induction Ventilation: Mask ventilation without difficulty and Oral airway inserted - appropriate to patient size Laryngoscope Size: Mac and 3 Grade View: Grade I Endobronchial tube: Left, Double lumen EBT, EBT position confirmed by auscultation and EBT position confirmed by fiberoptic bronchoscope and 37 Fr Airway Equipment and Method: Stylet Placement Confirmation: ETT inserted through vocal cords under direct vision,  positive ETCO2 and breath sounds checked- equal and bilateral Tube secured with: Tape Dental Injury: Teeth and Oropharynx as per pre-operative assessment       The patient was identified and consent obtained.  TO was performed, and full barrier precautions were used.  The skin was anesthetized with lidocaine.  Once the vein was located with the 22 ga., the wire was inserted into the vein. The insertion site was dilated and the CVL was carefully inserted and sutured in place. The patient tolerated the procedure well.  CXR was ordered for PACU.Ultrasound guidance: relevant anatomy identified, needle position confirmed, vessel patent, wire seen inside the vessel.  Images printed for the medical record.  Start: 0805 End: 0815 J. Tedra Senegal, MD

## 2013-10-04 ENCOUNTER — Inpatient Hospital Stay (HOSPITAL_COMMUNITY): Payer: Medicare Other

## 2013-10-04 LAB — POCT I-STAT 3, ART BLOOD GAS (G3+)
ACID-BASE DEFICIT: 2 mmol/L (ref 0.0–2.0)
Bicarbonate: 25.9 mEq/L — ABNORMAL HIGH (ref 20.0–24.0)
O2 SAT: 88 %
TCO2: 28 mmol/L (ref 0–100)
pCO2 arterial: 54 mmHg — ABNORMAL HIGH (ref 35.0–45.0)
pH, Arterial: 7.287 — ABNORMAL LOW (ref 7.350–7.450)
pO2, Arterial: 61 mmHg — ABNORMAL LOW (ref 80.0–100.0)

## 2013-10-04 LAB — BASIC METABOLIC PANEL
BUN: 22 mg/dL (ref 6–23)
CHLORIDE: 104 meq/L (ref 96–112)
CO2: 26 mEq/L (ref 19–32)
Calcium: 9 mg/dL (ref 8.4–10.5)
Creatinine, Ser: 1.13 mg/dL — ABNORMAL HIGH (ref 0.50–1.10)
GFR calc Af Amer: 63 mL/min — ABNORMAL LOW (ref >90)
GFR, EST NON AFRICAN AMERICAN: 54 mL/min — AB (ref >90)
GLUCOSE: 125 mg/dL — AB (ref 70–99)
POTASSIUM: 4.9 meq/L (ref 3.7–5.3)
SODIUM: 139 meq/L (ref 137–147)

## 2013-10-04 LAB — CBC
HCT: 39.7 % (ref 36.0–46.0)
Hemoglobin: 12.6 g/dL (ref 12.0–15.0)
MCH: 31.4 pg (ref 26.0–34.0)
MCHC: 31.7 g/dL (ref 30.0–36.0)
MCV: 99 fL (ref 78.0–100.0)
Platelets: 186 10*3/uL (ref 150–400)
RBC: 4.01 MIL/uL (ref 3.87–5.11)
RDW: 14.6 % (ref 11.5–15.5)
WBC: 8.7 10*3/uL (ref 4.0–10.5)

## 2013-10-04 LAB — BLOOD GAS, ARTERIAL
Acid-Base Excess: 2 mmol/L (ref 0.0–2.0)
Bicarbonate: 27.8 meq/L — ABNORMAL HIGH (ref 20.0–24.0)
O2 Content: 4 L/min
O2 Saturation: 93.1 %
Patient temperature: 98.6
TCO2: 29.6 mmol/L (ref 0–100)
pCO2 arterial: 58.3 mmHg (ref 35.0–45.0)
pH, Arterial: 7.299 — ABNORMAL LOW (ref 7.350–7.450)
pO2, Arterial: 67.8 mmHg — ABNORMAL LOW (ref 80.0–100.0)

## 2013-10-04 MED ORDER — POTASSIUM CHLORIDE CRYS ER 20 MEQ PO TBCR
20.0000 meq | EXTENDED_RELEASE_TABLET | Freq: Every day | ORAL | Status: DC
  Administered 2013-10-04 – 2013-10-07 (×4): 20 meq via ORAL
  Filled 2013-10-04 (×4): qty 1

## 2013-10-04 MED ORDER — ENOXAPARIN SODIUM 40 MG/0.4ML ~~LOC~~ SOLN
40.0000 mg | SUBCUTANEOUS | Status: DC
Start: 2013-10-04 — End: 2013-10-07
  Administered 2013-10-04 – 2013-10-07 (×4): 40 mg via SUBCUTANEOUS
  Filled 2013-10-04 (×5): qty 0.4

## 2013-10-04 MED ORDER — FUROSEMIDE 40 MG PO TABS
40.0000 mg | ORAL_TABLET | Freq: Every day | ORAL | Status: DC
  Administered 2013-10-04 – 2013-10-07 (×4): 40 mg via ORAL
  Filled 2013-10-04 (×4): qty 1

## 2013-10-04 MED ORDER — ALBUTEROL SULFATE (2.5 MG/3ML) 0.083% IN NEBU
INHALATION_SOLUTION | RESPIRATORY_TRACT | Status: AC
  Administered 2013-10-04: 2.5 mg via RESPIRATORY_TRACT
  Filled 2013-10-04: qty 3

## 2013-10-04 MED ORDER — ALBUTEROL SULFATE (2.5 MG/3ML) 0.083% IN NEBU
2.5000 mg | INHALATION_SOLUTION | Freq: Four times a day (QID) | RESPIRATORY_TRACT | Status: DC
Start: 2013-10-04 — End: 2013-10-06
  Administered 2013-10-04 – 2013-10-06 (×7): 2.5 mg via RESPIRATORY_TRACT
  Filled 2013-10-04 (×7): qty 3

## 2013-10-04 NOTE — Progress Notes (Addendum)
1 Day Post-Op Procedure(s) (LRB): Left Video Assited Thoracoscopy (Left) Left Lung Biopsy (Left) Subjective: No complaints this AM  Denies pain and nausea  Was hypercarbic earlier- required BIPAP  Objective: Vital signs in last 24 hours: Temp:  [96.9 F (36.1 C)-98.6 F (37 C)] 98.3 F (36.8 C) (05/07 0743) Pulse Rate:  [62-76] 64 (05/07 0800) Cardiac Rhythm:  [-] Normal sinus rhythm (05/07 0800) Resp:  [10-29] 16 (05/07 0800) BP: (88-128)/(38-64) 98/51 mmHg (05/07 0800) SpO2:  [90 %-96 %] 90 % (05/07 0810) Arterial Line BP: (101-143)/(43-78) 111/51 mmHg (05/07 0800) FiO2 (%):  [40 %] 40 % (05/07 0519) Weight:  [318 lb 8 oz (144.471 kg)-319 lb 0.1 oz (144.7 kg)] 319 lb 0.1 oz (144.7 kg) (05/07 0500)  Hemodynamic parameters for last 24 hours:    Intake/Output from previous day: 05/06 0701 - 05/07 0700 In: 2939 [P.O.:480; I.V.:2409; IV Piggyback:50] Out: [id number] [Urine:2110; Chest Tube:349] Intake/Output this shift: Total I/O In: 220 [P.O.:120; I.V.:100] Out: 30 [Urine:30]  General appearance: alert and no distress Neurologic: intact Heart: regular rate and rhythm Lungs: diminished breath sounds bibasilar and wheezes faint bilaterally Abdomen: normal findings: soft, non-tender no air leak  Lab Results:  Recent Labs  10/01/13 1458 10/04/13 0420  WBC 6.9 8.7  HGB 15.0 12.6  HCT 44.6 39.7  PLT 220 186   BMET:  Recent Labs  10/01/13 1458 10/04/13 0420  NA 138 139  K 4.5 4.9  CL 101 104  CO2 23 26  GLUCOSE 82 125*  BUN 27* 22  CREATININE 1.14* 1.13*  CALCIUM 10.5 9.0    PT/INR:  Recent Labs  10/01/13 1458  LABPROT 11.8  INR 0.88   ABG    Component Value Date/Time   PHART 7.299* 10/04/2013 0420   HCO3 27.8* 10/04/2013 0420   TCO2 29.6 10/04/2013 0420   O2SAT 93.1 10/04/2013 0420   CBG (last 3)  No results found for this basename: GLUCAP,  in the last 72 hours  Assessment/Plan: S/P Procedure(s) (LRB): Left Video Assited Thoracoscopy (Left) Left  Lung Biopsy (Left) POD # 1 Lung biopsy  CV- stable  RESP- acute on chronic hypercarbic respiratory failure requiring BIPAP earlier this am  Looks good currently, continue IS, nebs, BIPAP PRN  Chest tube to water seal- can probably dc tomorrow  RENAL- lytes, creatinine OK, restart lasix  enoxparain + SCD for DVT prophylaxis  Ambulate  Transfer to step down when bed available   LOS: 1 day    Melrose Nakayama 10/04/2013

## 2013-10-04 NOTE — Op Note (Signed)
[name], [name] NO.:  [id number]  MEDICAL RECORD NO.:  [id number]  LOCATION:  2S07C                        FACILITY:  [facility]  PHYSICIAN:  Revonda Standard. Roxan Hockey, M.D.DATE OF BIRTH:  May 23, 1961  DATE OF PROCEDURE:  10/03/2013 DATE OF DISCHARGE:                              OPERATIVE REPORT   PREOPERATIVE DIAGNOSIS:  Interstitial lung disease.  POSTOPERATIVE DIAGNOSIS:  Interstitial lung disease.  PROCEDURES: 1. Left video-assisted thoracoscopy. 2. Lung biopsy of left upper lobe x2 and left lower lobe x1.  SURGEON:  Revonda Standard. Roxan Hockey, MD  ASSISTANT:  John Giovanni, PA-C  ANESTHESIA:  General.  FINDINGS:  Lung diffusely diseased and nodular.  Unable to palpate 4-mm nodule in the left lower lobe.  Frozen section biopsies revealed interstitial lung disease and/or hypersensitivity pneumonitis. Diagnosis deferred to permanent path.  CLINICAL NOTE:  [name] is a 53 year old woman with a history of smoking, morbid obesity, moderate aortic stenosis, and diffuse bilateral pulmonary micronodular disease.  She has been having progressively worsening shortness of breath and was referred for lung biopsy after bronchoscopic biopsy was nondiagnostic.  The indications, risks, benefits, and alternatives were discussed in detail with the patient. She understood and accepted the risks and agreed to proceed.  DESCRIPTION OF PROCEDURE:  [name] was brought to the preoperative holding area on Oct 03, 2013. Anesthesia placed a central line and an arterial blood pressure monitoring line.  She was taken to the operating room, anesthetized, and intubated with a double- lumen endotracheal tube.  Intravenous antibiotics were administered. Sequential compression devices were placed on the legs for DVT prophylaxis.  A Foley catheter was placed.  She was placed in a right lateral decubitus position and the left chest was prepped and draped in usual sterile  fashion.  Single lung ventilation of the right lung was initiated and was tolerated well throughout the procedure.  An incision was made in approximately the seventh intercostal space in the midaxillary line and was carried through the skin and subcutaneous tissue.  A 5-mm port was inserted.  The thoracoscope was advanced through the port into the chest.  There was good isolation of the left lung, but it was very slow to deflate.  The lung had a diffusely nodular and grayish appearance.  The most abnormal-appearing portion of the lung was the tip of the lingula.  A utility incision was made anterolaterally.  It was unclear which space in this was due to the patient's morbid obesity.  No rib spreading was performed during the procedure.  The lingula was grasped and a biopsy was taken with sequential firings of an endoscopic GIA stapler using purple cartridges. There was good hemostasis to the staple line.  The specimen was sent for frozen section.  While awaiting the frozen section, the left lower lobe was grasped and retracted anteriorly.  Attempts to palpate the 4-mm nodule seen on CT were unsuccessful.  The entire lung had a nodular feel to it and it would have required a thoracotomy to definitively identify that lesion. Since it was not growing on CT and that was not the primary purpose of the procedure, that was not done.  An area along the medial aspect  of the left lower lobe appeared particularly diseased.  This was biopsied with sequential firings of an endoscopic GIA stapler. Finally, a portion of the upper lobe more superiorly along the fissure was taken as well.  These specimens were sent for permanent sections only.  There was some bleeding from the lower lobe staple line.  Pressure was applied with a sponge stick and the bleeding stopped.  The frozen section returned showing interstitial lung disease and/or hypersensitivity pneumonitis.  The final diagnosis was deferred to  permanent pathology.  After a final inspection for hemostasis, the scope was removed.  A 28-French chest tube was placed through the original port incision and secured with a #1 silk suture. The utility incision was closed with a running #1 Vicryl fascial suture followed by 2-0 Vicryl subcutaneous suture and a 3-0 Vicryl subcuticular suture.  Dermabond was applied to the incision.  The chest tube site was dressed.  The patient was placed back in supine position.  She was extubated in the operating room and taken to the postanesthetic care unit in good condition.     Revonda Standard Roxan Hockey, M.D.     SCH/MEDQ  D:  10/03/2013  T:  10/04/2013  Job:  [id number]

## 2013-10-04 NOTE — Addendum Note (Signed)
Addendum created 10/04/13 1107 by Babs Bertin, CRNA   Modules edited: Charges VN

## 2013-10-04 NOTE — Progress Notes (Signed)
CRITICAL VALUE ALERT  Critical value received:   PCO2 58.3  Date of notification:  10/04/13  Time of notification:  0430  Critical value read back:yes  Nurse who received alert:  Everlene Other  MD notified (1st page):  Dr. Roxy Manns  Time of first page:  [phone]  MD notified (2nd page):  Time of second page:  Responding MD:  Dr. Roxy Manns  Time MD responded:  [phone]

## 2013-10-04 NOTE — Progress Notes (Signed)
ABG result given to Dr Roxan Hockey Arterial line left in per dr Roxan Hockey

## 2013-10-05 ENCOUNTER — Encounter (HOSPITAL_COMMUNITY): Payer: Self-pay | Admitting: Thoracic Surgery (Cardiothoracic Vascular Surgery)

## 2013-10-05 ENCOUNTER — Inpatient Hospital Stay (HOSPITAL_COMMUNITY): Payer: Medicare Other

## 2013-10-05 LAB — COMPREHENSIVE METABOLIC PANEL
ALBUMIN: 3.2 g/dL — AB (ref 3.5–5.2)
ALT: 18 U/L (ref 0–35)
AST: 19 U/L (ref 0–37)
Alkaline Phosphatase: 71 U/L (ref 39–117)
BUN: 19 mg/dL (ref 6–23)
CALCIUM: 8.8 mg/dL (ref 8.4–10.5)
CO2: 26 mEq/L (ref 19–32)
Chloride: 104 mEq/L (ref 96–112)
Creatinine, Ser: 1.1 mg/dL (ref 0.50–1.10)
GFR calc Af Amer: 65 mL/min — ABNORMAL LOW (ref >90)
GFR, EST NON AFRICAN AMERICAN: 56 mL/min — AB (ref >90)
Glucose, Bld: 139 mg/dL — ABNORMAL HIGH (ref 70–99)
Potassium: 4.6 mEq/L (ref 3.7–5.3)
Sodium: 141 mEq/L (ref 137–147)
Total Bilirubin: 0.3 mg/dL (ref 0.3–1.2)
Total Protein: 6.9 g/dL (ref 6.0–8.3)

## 2013-10-05 LAB — CBC
HCT: 39.5 % (ref 36.0–46.0)
Hemoglobin: 12.5 g/dL (ref 12.0–15.0)
MCH: 31.4 pg (ref 26.0–34.0)
MCHC: 31.6 g/dL (ref 30.0–36.0)
MCV: 99.2 fL (ref 78.0–100.0)
PLATELETS: 169 10*3/uL (ref 150–400)
RBC: 3.98 MIL/uL (ref 3.87–5.11)
RDW: 14.4 % (ref 11.5–15.5)
WBC: 8.8 10*3/uL (ref 4.0–10.5)

## 2013-10-05 NOTE — Discharge Summary (Signed)
Physician Discharge Summary  Patient ID: [name] MRN: [id number] DOB/AGE: 06/27/1960 53 y.o.  Admit date: 10/03/2013 Discharge date: 10/05/2013  Admission Diagnoses:  Patient Active Problem List   Diagnosis Date Noted  . Interstitial lung disorders 10/03/2013  . Tobacco abuse 10/02/2013  . Aortic stenosis 10/02/2013  . Essential hypertension 10/02/2013  . Hyperlipidemia 10/02/2013  . Chronic asthmatic bronchitis 08/06/2013  . Pulmonary nodules 11/15/2012  . Abnormal CT scan, chest 08/09/2012   Discharge Diagnoses:   Patient Active Problem List   Diagnosis Date Noted  . Interstitial lung disorders 10/03/2013  . Tobacco abuse 10/02/2013  . Aortic stenosis 10/02/2013  . Essential hypertension 10/02/2013  . Hyperlipidemia 10/02/2013  . Chronic asthmatic bronchitis 08/06/2013  . Pulmonary nodules 11/15/2012  . Abnormal CT scan, chest 08/09/2012   Discharged Condition: good  History of Present Illness:   [name] is a 53 yo morbidly obese white female with known history of smoking and moderate Aortic Stenosis.  She was referred to [facility] for biopsy of diffuse pulmonary nodules.  The patient states she has been experiencing shortness of breath with exertion for the past several years.  This occurs with ambulating stairs and walking a distance of 100 ft on flat ground.  She also has an occasional dry cough and wheezing.  The patient underwent Bronchoscopy with biopsy with Dr. Gwenette Greet which revealed benign lung tissue with hemosiderin laden macrophages.  She was offered medical treatment vs surgical biopsy, at which time the patient chose to undergo medical therapy.  However, as of late the patients dyspnea on exertion has worsened.  This occurs with ambulating only 30-40 ft and she is no longer able to ambulate up stairs without becoming short of breath.  She was evaluated by Dr. Roxan Hockey on 09/14/13 at which time he again offered the patient surgical lung biopsy.  However, with  her history of Aortic Stenosis he felt she should be evaluated by Dr. Gwenlyn Found prior to proceeding with this.  The risks and benefits of the surgery were explained to the patient and she was agreeable to proceed.  She was evaluated 10/02/2013 which showed moderate Aortic Stenosis via ECHO.  It was felt she should be safe to proceed with VATS procedure.     Hospital Course:   She presented to [facility] on 10/03/2013.  She was taken to the operating room and underwent Left Video Assisted Thoracoscopy with Left Lung Wedge Resection x 3.  The patient tolerated the procedure well, was extubated and taken to the SICU in stable condition.  During her stay in the ICU the patient developed hypercarbia.  She was placed on BIPAP support with good results.  Her chest tube did not exhibit evidence of an air leak and she was transitioned to water seal.  The patient's morning chest xray did not show evidence of pneumothorax.  There was persistent basilar opacities and atelectasis.  Her chest tube was removed without difficulty.  The patient has been weaned from supplemental oxygen.  She is ambulating with minimal difficulty.  She has been evaluated on today's date and is stable for discharge home.    Treatments: surgery:   1. Left video-assisted thoracoscopy.  2. Lung biopsy of left upper lobe x2 and left lower lobe x1.  Disposition: 01-Home or Self Care  Discharge Medications:    Medication List         allopurinol 300 MG tablet  Commonly known as:  ZYLOPRIM  Take 300 mg by mouth daily.  aspirin 81 MG tablet  Take 81 mg by mouth daily.     BENICAR HCT 40-25 MG per tablet  Generic drug:  olmesartan-hydrochlorothiazide  Take 1 tablet by mouth daily.     buPROPion 300 MG 24 hr tablet  Commonly known as:  WELLBUTRIN XL  Take 300 mg by mouth every morning.     Calcium-Vitamin D 600-200 MG-UNIT Caps  Take 1 tablet by mouth 2 (two) times daily.     citalopram 20 MG tablet  Commonly known as:   CELEXA  Take 20 mg by mouth daily.     fish oil-omega-3 fatty acids 1000 MG capsule  Take 1 g by mouth daily.     furosemide 40 MG tablet  Commonly known as:  LASIX  Take 40 mg by mouth daily.     KLOR-CON M20 20 MEQ tablet  Generic drug:  potassium chloride SA  Take 20 mEq by mouth daily.     metoprolol succinate 50 MG 24 hr tablet  Commonly known as:  TOPROL-XL  Take 50 mg by mouth daily.     pravastatin 80 MG tablet  Commonly known as:  PRAVACHOL  Take 80 mg by mouth daily.     SPIRIVA HANDIHALER 18 MCG inhalation capsule  Generic drug:  tiotropium  Place 18 mcg into inhaler and inhale daily.     traMADol 50 MG tablet  Commonly known as:  ULTRAM  Take 1-2 tablets (50-100 mg total) by mouth every 6 (six) hours as needed (mild pain).     TYLENOL ARTHRITIS PAIN PO  Take 3 tablets by mouth 2 (two) times daily.     Vitamin D3 2000 UNITS Tabs  Take 2,000 Units by mouth daily.           Future Appointments Provider Department Dept Phone   01/08/2014 11:00 AM [facility] [address] [id number]   01/17/2014 10:00 AM Lorretta Harp, MD [facility] [phone]     Follow-up Information   Follow up with Melrose Nakayama, MD On 10/23/2013. (Appointment is at 1:30)    Specialty:  Cardiothoracic Surgery   Contact information:   [place] [address] [phone]       Follow up with [facility] On 10/23/2013. (Please get CXR at 12:30)    Contact information:   [place]       Follow up with [facility] In 1 week. (For suture removal- office will contact you with an appointment)         Signed: Ellwood Handler 10/05/2013, 1:46 PM

## 2013-10-05 NOTE — Discharge Instructions (Signed)
Video-Assisted Thoracic Surgery °Care After °Refer to this sheet in the next few weeks. These instructions provide you with information on caring for yourself after your procedure. Your caregiver may also give you more specific instructions. Your procedure has been planned according to current medical practices, but problems sometimes occur. Call your caregiver if you have any problems or questions after your procedure. °HOME CARE INSTRUCTIONS  °· Only take over-the-counter or prescription medications as directed. °· Only take pain medications (narcotics) as directed. °· Do not drive until your caregiver approves. Driving while taking narcotics or soon after surgery can be dangerous, so discuss the specific timing with your caregiver. °· Avoid activities that use your chest muscles, such as lifting heavy objects, for at least 3 4 weeks.   °· Take deep breaths to expand the lungs and to protect against pneumonia. °· Do breathing exercises as directed by your caregiver. If you were given an incentive spirometer to help with breathing, use it as directed. °· You may resume a normal diet and activities when you feel you are able to or as directed. °· Do not take a bath until your caregiver says it is OK. Use the shower instead.   °· Keep the bandage (dressing) covering the area where the chest tube was inserted (incision site) dry for 48 hours. After 48 hours, remove the dressing unless there is new drainage. °· Remove dressings as directed by your caregiver. °· Change dressings if necessary or as directed. °· Keep all follow-up appointments. It is important for you to see your caregiver after surgery to discuss appropriate follow-up care and surveillance, if it is necessary. °SEEK MEDICAL CARE: °· You feel excessive or increasing pain at an incision site. °· You notice bleeding, skin irritation, drainage, swelling, or redness at an incision site. °· There is a bad smell coming from an incision or dressing. °· It feels  like your heart is fluttering or beating rapidly. °· Your pain medication does not relieve your pain. °SEEK IMMEDIATE MEDICAL CARE IF:  °· You have a fever.   °· You have chest pain.  °· You have a rash. °· You have shortness of breath. °· You have trouble breathing.   °· You feel weak, lightheaded, dizzy, or faint.   °MAKE SURE YOU:  °· Understand these instructions.   °· Will watch your condition.   °· Will get help right away if you are not doing well or get worse. °Document Released: 09/11/2012 Document Reviewed: 09/11/2012 °Exit[employer]® Patient Information ©2014 [employer]. ° °

## 2013-10-05 NOTE — Progress Notes (Addendum)
26 ml of fentanyl wasted from the PCA syringe in sink with Eloise Harman, [place]

## 2013-10-05 NOTE — Progress Notes (Addendum)
      [address]       [name] [address]             [phone]      2 Days Post-Op Procedure(s) (LRB): Left Video Assited Thoracoscopy (Left) Left Lung Biopsy (Left)  Subjective:  [name] has no complaints this morning.  She continues to have a productive cough with white sputum.   Objective: Vital signs in last 24 hours: Temp:  [97.9 F (36.6 C)-98.6 F (37 C)] 97.9 F (36.6 C) (05/08 0755) Pulse Rate:  [64-77] 65 (05/08 0700) Cardiac Rhythm:  [-] Normal sinus rhythm (05/08 0600) Resp:  [14-23] 17 (05/08 0700) BP: (88-108)/(47-64) 108/51 mmHg (05/07 2000) SpO2:  [90 %-95 %] 94 % (05/08 0700) Arterial Line BP: (98-122)/(44-62) 99/44 mmHg (05/08 0700)   Intake/Output from previous day: 05/07 0701 - 05/08 0700 In: 2375 [P.O.:1060; I.V.:1265; IV Piggyback:50] Out: 2405 [Urine:2245; Chest Tube:160]  General appearance: alert, cooperative and no distress Heart: regular rate and rhythm Lungs: diminished breath sounds bibasilar Abdomen: soft, non-tender; bowel sounds normal; no masses,  no organomegaly Wound: clean and dry  Lab Results:  Recent Labs  10/04/13 0420 10/05/13 0419  WBC 8.7 8.8  HGB 12.6 12.5  HCT 39.7 39.5  PLT 186 169   BMET:  Recent Labs  10/04/13 0420 10/05/13 0419  NA 139 141  K 4.9 4.6  CL 104 104  CO2 26 26  GLUCOSE 125* 139*  BUN 22 19  CREATININE 1.13* 1.10  CALCIUM 9.0 8.8    PT/INR: No results found for this basename: LABPROT, INR,  in the last 72 hours ABG    Component Value Date/Time   PHART 7.287* 10/04/2013 0853   HCO3 25.9* 10/04/2013 0853   TCO2 28 10/04/2013 0853   ACIDBASEDEF 2.0 10/04/2013 0853   O2SAT 88.0 10/04/2013 0853   CBG (last 3)  No results found for this basename: GLUCAP,  in the last 72 hours  Assessment/Plan: S/P Procedure(s) (LRB): Left Video Assited Thoracoscopy (Left) Left Lung Biopsy (Left)  1. Chest tube- no air leak, CXR stable in appearance, + persistent basilar opacities-  will remove chest tube 2. Pulm- wean oxygen as tolerated, continue flutter valve, nebs, BIPAP prn 3. CV- HTN controlled, continue current medications 4. D/C PCA after chest tube removal 5. D/C Arterial LIne 6. D/C Foley Catheter 7. Dispo- patient stable, d/c chest tube, work on pulm toilet, weaning oxygen, awaiting transfer to 3300   LOS: 2 days    [name] 10/05/2013   Chart reviewed, patient examined, agree with above. She looks good after chest tube removal. Denies shortness of breath. She is on 4L  with sats 91%. Will send to 2W.

## 2013-10-05 NOTE — Progress Notes (Signed)
Patient transferred to [facility] room 8 with belongings and nurse. No new issues at this time. Nurse at beside. [name]

## 2013-10-06 ENCOUNTER — Inpatient Hospital Stay (HOSPITAL_COMMUNITY): Payer: Medicare Other

## 2013-10-06 NOTE — Progress Notes (Addendum)
       [address]       [place] [address]             [phone]          3 Days Post-Op Procedure(s) (LRB): Left Video Assited Thoracoscopy (Left) Left Lung Biopsy (Left)  Subjective: +productive cough, white sputum. Otherwise feeling well.   Objective: Vital signs in last 24 hours: Patient Vitals for the past 24 hrs:  BP Temp Temp src Pulse Resp SpO2 Weight  10/06/13 0745 - - - - - 94 % -  10/06/13 0531 116/60 mmHg 98 F (36.7 C) Oral 69 16 93 % -  10/06/13 0231 - - - - - - 318 lb 6.4 oz (144.425 kg)  10/05/13 2223 - - - - - 96 % -  10/05/13 2023 115/58 mmHg 97.9 F (36.6 C) Oral 70 16 94 % -  10/05/13 1536 112/57 mmHg 98 F (36.7 C) Oral 72 20 94 % -  10/05/13 1500 - - - 75 21 88 % -  10/05/13 1400 98/51 mmHg - - 73 19 90 % -  10/05/13 1300 97/54 mmHg - - 71 19 90 % -  10/05/13 1238 - - - - - 93 % -  10/05/13 1200 106/52 mmHg - - 70 19 95 % -  10/05/13 1138 - 97.8 F (36.6 C) Oral - - - -  10/05/13 1100 107/54 mmHg - - 71 19 93 % -   Current Weight  10/06/13 318 lb 6.4 oz (144.425 kg)     Intake/Output from previous day: 05/08 0701 - 05/09 0700 In: 720 [P.O.:720] Out: 560 [Urine:510; Chest Tube:50]    PHYSICAL EXAM:  Heart: RRR Lungs: Decreased BS bilaterally Wound: Ecchymosis, but no erythema around incision     Lab Results: CBC: Recent Labs  10/04/13 0420 10/05/13 0419  WBC 8.7 8.8  HGB 12.6 12.5  HCT 39.7 39.5  PLT 186 169   BMET:  Recent Labs  10/04/13 0420 10/05/13 0419  NA 139 141  K 4.9 4.6  CL 104 104  CO2 26 26  GLUCOSE 125* 139*  BUN 22 19  CREATININE 1.13* 1.10  CALCIUM 9.0 8.8    PT/INR: No results found for this basename: LABPROT, INR,  in the last 72 hours    Assessment/Plan: S/P Procedure(s) (LRB): Left Video Assited Thoracoscopy (Left) Left Lung Biopsy (Left) CXR stable, still on 4L.  Continue pulm toilet and wean as tolerated. HTN- stable on home meds. Ambulate in halls. Path  pending.   LOS: 3 days    Coolidge Breeze 10/06/2013   Chart reviewed, patient examined, agree with above. She was not on oxygen preop and baseline sat was 95% on room air. Continue IS, ambulation. Check sats off oxygen. If 88% or less she will need to go home on oxygen. She could potentially go home tomorrow but if she needs oxygen it will depend on whether this can be arranged on Sunday.

## 2013-10-07 ENCOUNTER — Inpatient Hospital Stay (HOSPITAL_COMMUNITY): Payer: Medicare Other

## 2013-10-07 MED ORDER — TRAMADOL HCL 50 MG PO TABS: 50.0000 mg | ORAL_TABLET | Freq: Four times a day (QID) | ORAL | Status: DC | PRN

## 2013-10-07 NOTE — Progress Notes (Signed)
       [address]       [place] [address]             [phone]          4 Days Post-Op Procedure(s) (LRB): Left Video Assited Thoracoscopy (Left) Left Lung Biopsy (Left)  Subjective: Feels well, off O2 and sats >92% at rest.     Objective: Vital signs in last 24 hours: Patient Vitals for the past 24 hrs:  BP Temp Temp src Pulse Resp SpO2  10/07/13 0752 - - - - - 92 %  10/07/13 0717 - - - - - 96 %  10/07/13 0635 128/53 mmHg 99.2 F (37.3 C) Oral 64 18 94 %  10/06/13 2006 - 99.1 F (37.3 C) Oral 69 18 95 %  10/06/13 1407 98/53 mmHg 98.4 F (36.9 C) Oral 70 20 97 %   Current Weight  10/06/13 318 lb 6.4 oz (144.425 kg)     Intake/Output from previous day: 05/09 0701 - 05/10 0700 In: 600 [P.O.:600] Out: 400 [Urine:400]    PHYSICAL EXAM:  Heart: RRR Lungs: Clear Wound: Clean and dry    Lab Results: CBC: Recent Labs  10/05/13 0419  WBC 8.8  HGB 12.5  HCT 39.5  PLT 169   BMET:  Recent Labs  10/05/13 0419  NA 141  K 4.6  CL 104  CO2 26  GLUCOSE 139*  BUN 19  CREATININE 1.10  CALCIUM 8.8    PT/INR: No results found for this basename: LABPROT, INR,  in the last 72 hours  CXR: stable, no ptx   Assessment/Plan: S/P Procedure(s) (LRB): Left Video Assited Thoracoscopy (Left) Left Lung Biopsy (Left) Pt currently off O2 at rest and sats stable.  Will ambulate and check sats so we can arrange home O2 if needed. Plan d/c home today- instructions reviewed with patient.   LOS: 4 days    Coolidge Breeze 10/07/2013

## 2013-10-07 NOTE — Progress Notes (Signed)
10/07/2013 10:46 AM Nursing note Discharge avs form, medications already taken today and those due this evening given and explained to patient. Follow up appointments, activity restrictions, incision site care and when to call MD reviewed. rx given and reviewed with patient. D/c iv line. D/c tele. D/c home with family per orders.  [place]

## 2013-10-07 NOTE — Progress Notes (Signed)
10/07/2013 1000 Nursing note  Pt. Ambulated 150ft  With RN supervision and on RA. Pt. Tolerated walk well. Oxygen saturations on room air remained above 90% during walk. Suzzanne Cloud [facility] paged and made aware. Verbal orders received ok to discharge patient without home oxygen.  [place]

## 2013-10-08 ENCOUNTER — Encounter (HOSPITAL_COMMUNITY): Payer: Self-pay

## 2013-10-08 NOTE — Progress Notes (Addendum)
CARE MANAGEMENT NOTE 10/08/2013  Patient:  [name], [name]   Account Number:  [id number]  Date Initiated:  10/08/2013  Documentation initiated by:  [facility]  Subjective/Objective Assessment:   Left Video Assisted Thoracoscopy with Left Lung Wedge Resection x 3     Action/Plan:   Anticipated DC Date:  10/07/2013   Anticipated DC Plan:  [place]  CM consult      Choice offered to / List presented to:             Status of service:  Completed, signed off Medicare Important Message given?  NA - LOS <3 / Initial given by admissions (If response is "NO", the following Medicare IM given date fields will be blank) Date Medicare IM given:   Date Additional Medicare IM given:    Discharge Disposition:  HOME/SELF CARE  Per UR Regulation:    If discussed at Long Length of Stay Meetings, dates discussed:    Comments:  10/08/2013 1030 No NCM needs identified. Jonnie Finner RN CCM Case Mgmt phone [phone]

## 2013-10-11 ENCOUNTER — Telehealth: Payer: Self-pay | Admitting: Pulmonary Disease

## 2013-10-11 NOTE — Telephone Encounter (Signed)
[name], pt needs ov with me to discuss biopsy results.

## 2013-10-12 ENCOUNTER — Ambulatory Visit (INDEPENDENT_AMBULATORY_CARE_PROVIDER_SITE_OTHER): Payer: Medicare Other | Admitting: Pulmonary Disease

## 2013-10-12 ENCOUNTER — Encounter: Payer: Self-pay | Admitting: Pulmonary Disease

## 2013-10-12 ENCOUNTER — Other Ambulatory Visit (INDEPENDENT_AMBULATORY_CARE_PROVIDER_SITE_OTHER): Payer: Medicare Other

## 2013-10-12 VITALS — BP 128/80 | HR 55 | Temp 98.2°F | Ht 66.5 in | Wt 318.8 lb

## 2013-10-12 DIAGNOSIS — J8410 Pulmonary fibrosis, unspecified: Secondary | ICD-10-CM

## 2013-10-12 DIAGNOSIS — R042 Hemoptysis: Secondary | ICD-10-CM

## 2013-10-12 DIAGNOSIS — J849 Interstitial pulmonary disease, unspecified: Secondary | ICD-10-CM

## 2013-10-12 DIAGNOSIS — R9389 Abnormal findings on diagnostic imaging of other specified body structures: Secondary | ICD-10-CM

## 2013-10-12 DIAGNOSIS — R0489 Hemorrhage from other sites in respiratory passages: Secondary | ICD-10-CM

## 2013-10-12 LAB — SEDIMENTATION RATE: Sed Rate: 78 mm/hr — ABNORMAL HIGH (ref 0–22)

## 2013-10-12 LAB — C-REACTIVE PROTEIN: CRP: 2.5 mg/dL (ref 0.5–20.0)

## 2013-10-12 NOTE — Assessment & Plan Note (Signed)
The patient has a nodular process diffusely on CT chest, and the pathology from thoracoscopic biopsy shows only large numbers of hemosiderin laden macrophages. There was no obvious microscopic changes of a vasculitis, nor were there a lot of inflammatory changes. I will certainly send autoimmune blood work to make sure this is not an occult vasculitis, but I suspect that it isn't.  I am wondering if this represents primary pulmonary hemosiderosis. The pathology and the presentation would certainly fit with this diagnosis. The patient does not have significant anemia, but she has not been tested for iron deficiency. We'll check the appropriate blood work for autoimmune disease, but if this is unremarkable, will need to consider a trial of steroids. May also consider a referral to [facility] for a second opinion.

## 2013-10-12 NOTE — Patient Instructions (Signed)
Stay away from cigarettes.  You are doing great. Will send blood work today, looking for causes of bleeding into your air sacs.  Will call you with results.

## 2013-10-12 NOTE — Telephone Encounter (Signed)
Pt has an appt today at 12:00.Newcomb Bing, CMA

## 2013-10-12 NOTE — Progress Notes (Signed)
   Subjective:    Patient ID: [name], female    DOB: 1960/09/24, 53 y.o.   MRN: [id number]  HPI The patient comes in today for followup after her recent thoracoscopic biopsy. She has a history of nodular interstitial disease, with a negative bronchoscopy with transbronchial biopsy. She recently underwent thoracoscopic biopsy, with her pathology being somewhat unusual. It showed a large number of pigmented macrophages containing dark brown granules in the cytoplasm within the air space. This was most consistent with hemosiderin. There was no microscopic evidence for vasculitis, nor were there parenchymal interstitial changes. There were some areas of mild alveolar septal chronic fibrosis, primarily subpleural. I have reviewed the results with the patient at length, and answered all of her questions. She currently is doing better from a breathing standpoint, and has not smoked since before the surgery.   Review of Systems  Constitutional: Negative for fever and unexpected weight change.  HENT: Negative for congestion, dental problem, ear pain, nosebleeds, postnasal drip, rhinorrhea, sinus pressure, sneezing, sore throat and trouble swallowing.   Eyes: Negative for redness and itching.  Respiratory: Negative for cough, chest tightness, shortness of breath and wheezing.   Cardiovascular: Negative for palpitations and leg swelling.  Gastrointestinal: Negative for nausea and vomiting.  Genitourinary: Negative for dysuria.  Musculoskeletal: Negative for joint swelling.  Skin: Negative for rash.  Neurological: Negative for headaches.  Hematological: Does not bruise/bleed easily.  Psychiatric/Behavioral: Negative for dysphoric mood. The patient is not nervous/anxious.        Objective:   Physical Exam Obese female in no acute distress Nose without purulence or discharge noted Neck without lymphadenopathy or thyromegaly Chest totally clear, no wheezing Cardiac exam with regular rate and  rhythm Lower extremities with minimal edema, no cyanosis Alert and oriented, moves all 4 extremities.       Assessment & Plan:

## 2013-10-13 LAB — RHEUMATOID FACTOR: Rhuematoid fact SerPl-aCnc: <10 IU/mL (ref <=14)

## 2013-10-15 ENCOUNTER — Ambulatory Visit (INDEPENDENT_AMBULATORY_CARE_PROVIDER_SITE_OTHER): Payer: Medicaid Other

## 2013-10-15 DIAGNOSIS — Z4802 Encounter for removal of sutures: Secondary | ICD-10-CM

## 2013-10-15 DIAGNOSIS — D381 Neoplasm of uncertain behavior of trachea, bronchus and lung: Secondary | ICD-10-CM

## 2013-10-15 LAB — ANA: ANA: NEGATIVE

## 2013-10-15 LAB — EXTRACTABLE NUCLEAR ANTIGEN ANTIBODY
DS DNA AB: 1 [IU]/mL
ENA SM AB SER-ACNC: NEGATIVE
SM/RNP: NEGATIVE
SSA (RO) (ENA) ANTIBODY, IGG: NEGATIVE
SSB (LA) (ENA) ANTIBODY, IGG: NEGATIVE
Scleroderma (Scl-70) (ENA) Antibody, IgG: <1

## 2013-10-15 LAB — MPO/PR-3 (ANCA) ANTIBODIES
Myeloperoxidase Abs: <1
Serine Protease 3: <1

## 2013-10-15 LAB — GLOMERULAR BASEMENT MEMBRANE ANTIBODIES: GBM Ab Interp: <1

## 2013-10-15 LAB — ANTI-SCLERODERMA ANTIBODY: SCLERODERMA (SCL-70) (ENA) ANTIBODY, IGG: NEGATIVE

## 2013-10-15 LAB — ANTI-DNA ANTIBODY, DOUBLE-STRANDED: ds DNA Ab: 1 IU/mL

## 2013-10-15 LAB — CYCLIC CITRUL PEPTIDE ANTIBODY, IGG: Cyclic Citrullin Peptide Ab: <2 U/mL (ref 0.0–5.0)

## 2013-10-15 NOTE — Progress Notes (Signed)
Removed 2 sutures from chest tube sites, No signs of infection and patient tolerated well.

## 2013-10-16 ENCOUNTER — Telehealth: Payer: Self-pay | Admitting: Pulmonary Disease

## 2013-10-16 NOTE — Telephone Encounter (Signed)
Duplicate. Meno Bing, CMA

## 2013-10-17 ENCOUNTER — Telehealth: Payer: Self-pay | Admitting: *Deleted

## 2013-10-17 MED ORDER — PREDNISONE 20 MG PO TABS: ORAL_TABLET | ORAL | Status: DC

## 2013-10-17 NOTE — Telephone Encounter (Addendum)
Message copied by Lilli Few on Wed Oct 17, 2013 10:47 AM ------      Message from: [place]      Created: Tue Oct 16, 2013  5:30 PM       I have reviewed bloodwork in detail with the pt, and no etiology has been found to explain her path findings.  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  Will start on prednisone for this, and see pt back in 3 weeks.  Offered to send her to [facility] for a second opinion, but she would like to try prednisone here first.             Anderson Malta, please call in prednisone 20mg :  Take 2 each day for 2 weeks, then take 1 1/2 each day until visit with me.  Needs ov in 3 weeks. ------  Pt [facility] and appt set for 11-06-13.[place] Bing, CMA

## 2013-10-19 ENCOUNTER — Other Ambulatory Visit: Payer: Self-pay | Admitting: Thoracic Surgery (Cardiothoracic Vascular Surgery)

## 2013-10-19 DIAGNOSIS — R042 Hemoptysis: Secondary | ICD-10-CM

## 2013-10-23 ENCOUNTER — Ambulatory Visit (INDEPENDENT_AMBULATORY_CARE_PROVIDER_SITE_OTHER): Payer: Self-pay | Admitting: Thoracic Surgery (Cardiothoracic Vascular Surgery)

## 2013-10-23 ENCOUNTER — Ambulatory Visit
Admission: RE | Admit: 2013-10-23 | Discharge: 2013-10-23 | Disposition: A | Discharge status: Home / Self Care | Payer: Medicare Other | Source: Ambulatory Visit | Attending: Thoracic Surgery (Cardiothoracic Vascular Surgery) | Admitting: Thoracic Surgery (Cardiothoracic Vascular Surgery)

## 2013-10-23 ENCOUNTER — Encounter: Payer: Self-pay | Admitting: Thoracic Surgery (Cardiothoracic Vascular Surgery)

## 2013-10-23 VITALS — BP 115/69 | HR 64 | Resp 18 | Ht 67.0 in | Wt 318.0 lb

## 2013-10-23 DIAGNOSIS — R042 Hemoptysis: Secondary | ICD-10-CM

## 2013-10-23 DIAGNOSIS — Z09 Encounter for follow-up examination after completed treatment for conditions other than malignant neoplasm: Secondary | ICD-10-CM

## 2013-10-23 DIAGNOSIS — J849 Interstitial pulmonary disease, unspecified: Secondary | ICD-10-CM

## 2013-10-23 DIAGNOSIS — J8410 Pulmonary fibrosis, unspecified: Secondary | ICD-10-CM

## 2013-10-23 DIAGNOSIS — R918 Other nonspecific abnormal finding of lung field: Secondary | ICD-10-CM | POA: Diagnosis not present

## 2013-10-23 NOTE — Progress Notes (Signed)
HPI:  Ms. Warns returns today for scheduled postoperative followup visit  She is a 53 year old woman with interstitial lung disease who had a left VATS lung biopsy on May 7. Her postoperative course was uncomplicated. Pathology showed hemosiderosis.  She says that she started feeling more congested on Friday. She's had a cough. She has coughed up a small amount of thick mucous. Her breathing is better since she started prednisone.  She's having minimal incisional discomfort and has not taken any pain medication and over a week.  Past Medical History  Diagnosis Date  . Allergic rhinitis   . Noninfectious lymphedema   . Undiagnosed cardiac murmurs   . Mitral stenosis   . Morbid obesity   . Depressive disorder   . Menopausal symptoms   . Mitral and aortic heart valve diseases, unspecified   . Vitamin D deficiency   . HTN (hypertension)   . Hyperlipidemia   . CAO (chronic airflow obstruction)   . Gout   . Hypertriglyceridemia   . Sleep apnea   . Kidney disease     Dr. Graylon Gunning  . Anxiety   . Arthritis   . Shortness of breath     with exertion      Current Outpatient Prescriptions  Medication Sig Dispense Refill  . Acetaminophen (TYLENOL ARTHRITIS PAIN PO) Take 3 tablets by mouth 2 (two) times daily.      Marland Kitchen allopurinol (ZYLOPRIM) 300 MG tablet Take 300 mg by mouth daily.       Marland Kitchen aspirin 81 MG tablet Take 81 mg by mouth daily.      Marland Kitchen BENICAR HCT 40-25 MG per tablet Take 1 tablet by mouth daily.       Marland Kitchen buPROPion (WELLBUTRIN XL) 300 MG 24 hr tablet Take 300 mg by mouth every morning.       . Calcium Carbonate-Vitamin D (CALCIUM-VITAMIN D) 600-200 MG-UNIT CAPS Take 1 tablet by mouth 2 (two) times daily.      . Cholecalciferol (VITAMIN D3) 2000 UNITS TABS Take 2,000 Units by mouth daily.       . citalopram (CELEXA) 20 MG tablet Take 20 mg by mouth daily.       . fish oil-omega-3 fatty acids 1000 MG capsule Take 1 g by mouth daily.      . furosemide (LASIX) 40 MG tablet  Take 40 mg by mouth daily.       Marland Kitchen KLOR-CON M20 20 MEQ tablet Take 20 mEq by mouth daily.       . metoprolol succinate (TOPROL-XL) 50 MG 24 hr tablet Take 50 mg by mouth daily.       . pravastatin (PRAVACHOL) 80 MG tablet Take 80 mg by mouth daily.       . predniSONE (DELTASONE) 20 MG tablet Take 2 tablets daily x 2 weeks, then 1.5 tablets daily until visit.  60 tablet  0  . SPIRIVA HANDIHALER 18 MCG inhalation capsule Place 18 mcg into inhaler and inhale daily.        No current facility-administered medications for this visit.    Physical Exam BP 115/69  Pulse 64  Resp 18  Ht 5\' 7"  (1.702 m)  Wt 318 lb (144.244 kg)  BMI 49.79 kg/m2  SpO34 59% 53 year old obese woman in no acute distress Incision healing well, minimal erythema chest tube site Lungs coarse breath sounds, equal bilaterally  Diagnostic Tests: Chest x-ray 10/23/2013 CHEST 2 VIEW  COMPARISON: 10/07/2013, 10/06/2013  FINDINGS:  Heart is upper limits normal in  size. There are patchy infiltrates  involving the lower lobes bilaterally but overall there has been  improvement in aeration since the prior studies. No pneumothorax or  pulmonary edema. No pleural effusions.  IMPRESSION:  Resolving bilateral infiltrates. No pleural effusions.  Electronically Signed  By: Shon Hale M.D.  On: 10/23/2013 12:49  Impression: 53 year old woman who is now 3 weeks post thoracoscopic lung biopsy. She is recovering well. Her underlying pulmonary disease is being treated by Dr. Gwenette Greet.  She is not requiring any narcotics. She may begin driving a limited basis. Appropriate precautions were discussed. Her activities are unrestricted, but she was cautioned to build into new activities gradually.  Plan:  I will be happy to see her back any time if I can be of any further assistance with her care

## 2013-10-25 ENCOUNTER — Telehealth: Payer: Self-pay | Admitting: Pulmonary Disease

## 2013-10-25 NOTE — Telephone Encounter (Signed)
Pt last seen on 10/12/13.  Now c/o PND for past 3 days.  Prod cough (green).  Denies fever, sob, or wheezing.  Taking otc robitussin or Mucinex but not helping.  [facility] advise[facility] [place]  No Known Allergies

## 2013-10-25 NOTE — Telephone Encounter (Signed)
Needs to start with chlorpheniramine otc 4mg , and can take 1-2 every 6 hrs for postnasal drip to see if that helps cough. Let us know if not improving.  Make sure that she has an upcoming ov with me to wrap up her pulmonary issues, and discuss long term planning.

## 2013-10-25 NOTE — Telephone Encounter (Signed)
Pt aware of recs per [facility] Pt has appt with [facility] 11/06/13 at 4:30 Nothing further needed.

## 2013-10-26 DIAGNOSIS — J019 Acute sinusitis, unspecified: Secondary | ICD-10-CM | POA: Diagnosis not present

## 2013-10-26 DIAGNOSIS — J209 Acute bronchitis, unspecified: Secondary | ICD-10-CM | POA: Diagnosis not present

## 2013-10-26 DIAGNOSIS — J069 Acute upper respiratory infection, unspecified: Secondary | ICD-10-CM | POA: Diagnosis not present

## 2013-11-06 ENCOUNTER — Ambulatory Visit (INDEPENDENT_AMBULATORY_CARE_PROVIDER_SITE_OTHER): Payer: Medicare Other | Admitting: Pulmonary Disease

## 2013-11-06 ENCOUNTER — Encounter: Payer: Self-pay | Admitting: Pulmonary Disease

## 2013-11-06 VITALS — BP 132/78 | HR 60 | Temp 98.0°F | Ht 66.5 in | Wt 314.2 lb

## 2013-11-06 DIAGNOSIS — J449 Chronic obstructive pulmonary disease, unspecified: Secondary | ICD-10-CM

## 2013-11-06 DIAGNOSIS — R042 Hemoptysis: Secondary | ICD-10-CM

## 2013-11-06 DIAGNOSIS — R0489 Hemorrhage from other sites in respiratory passages: Secondary | ICD-10-CM

## 2013-11-06 MED ORDER — PREDNISONE 10 MG PO TABS: ORAL_TABLET | ORAL | Status: DC

## 2013-11-06 NOTE — Progress Notes (Signed)
   Subjective:    Patient ID: [name], female    DOB: May 06, 1961, 53 y.o.   MRN: [id number]  HPI The patient comes in today for followup of her groundglass opacities, with biopsy showing idiopathic pulmonary hemosiderosis.  She has been treated with prednisone since the last visit, and thinks that she has seen an improvement in her breathing. She has also stopped smoking. She did have a cough with some increased congestion, but was treated with an antibiotic by her primary. She has taken prednisone and is now down to 30 mg a day.   Review of Systems  Constitutional: Negative for fever and unexpected weight change.  HENT: Negative for congestion, dental problem, ear pain, nosebleeds, postnasal drip, rhinorrhea, sinus pressure, sneezing, sore throat and trouble swallowing.   Eyes: Negative for redness and itching.  Respiratory: Positive for shortness of breath. Negative for cough, chest tightness and wheezing.   Cardiovascular: Negative for palpitations and leg swelling.  Gastrointestinal: Negative for nausea and vomiting.  Genitourinary: Negative for dysuria.  Musculoskeletal: Negative for joint swelling.  Skin: Negative for rash.  Neurological: Negative for headaches.  Hematological: Does not bruise/bleed easily.  Psychiatric/Behavioral: Negative for dysphoric mood. The patient is not nervous/anxious.        Objective:   Physical Exam Obese female in no acute distress Nose without purulence or discharge noted Neck without lymphadenopathy or thyromegaly Chest totally clear to auscultation, no wheezing Cardiac exam with regular rate and rhythm, 3/6 systolic murmur Lower extremities without edema, no cyanosis Alert and oriented, moves all 4 extremities.       Assessment & Plan:

## 2013-11-06 NOTE — Assessment & Plan Note (Signed)
The patient feels that her breathing is better since total smoking cessation and being treated with prednisone. She still has significant dyspnea on exertion with heavier activities.  I have explained to her there is no set course of prednisone for her particular issue, and that I anticipate being on the medication for at least a few months. We'll work toward a dose less than 20 mg a day and see how she responds. At some point she will need a followup high-resolution CT to check on progress. I've also encouraged her to work on some type of exercise program and weight loss.

## 2013-11-06 NOTE — Assessment & Plan Note (Signed)
The patient has not seen a big difference since being on Spiriva, and now that she has stopped smoking, I suspect she can get by with albuterol as needed.

## 2013-11-06 NOTE — Patient Instructions (Signed)
Stop spiriva, but continue albuterol as needed. Keep prednisone at 30mg /day for another 9 days, then decrease to 25mg /day for 2 weeks, then decrease to 20mg  a day until the next visit with me. Stay away from cigarettes.  You are doing great! followup with me again in 4 weeks.

## 2013-12-04 ENCOUNTER — Encounter: Payer: Self-pay | Admitting: Pulmonary Disease

## 2013-12-04 ENCOUNTER — Ambulatory Visit (INDEPENDENT_AMBULATORY_CARE_PROVIDER_SITE_OTHER): Payer: Medicare Other | Admitting: Pulmonary Disease

## 2013-12-04 VITALS — BP 132/82 | HR 69 | Temp 98.7°F | Ht 66.5 in | Wt 326.8 lb

## 2013-12-04 DIAGNOSIS — R0489 Hemorrhage from other sites in respiratory passages: Secondary | ICD-10-CM

## 2013-12-04 DIAGNOSIS — R042 Hemoptysis: Secondary | ICD-10-CM | POA: Diagnosis not present

## 2013-12-04 NOTE — Assessment & Plan Note (Signed)
The patient has clearly improved with smoking cessation and also since being on prednisone from a breathing standpoint. She is now down to 20 mg of prednisone a day, and I would like to decrease to 10-15 mg a day. She has not had radiographic followup since being treated, and we'll therefore order a CT chest before weaning her prednisone more aggressively. My only other thought is whether her valvular heart disease could play any role in this, possibly resulting in fractured capillaries??

## 2013-12-04 NOTE — Patient Instructions (Signed)
Will decrease prednisone to 15mg  a day until I can review your followup scan of chest. Will schedule for ct chest in next 2 weeks.  Will call with results.

## 2013-12-04 NOTE — Progress Notes (Signed)
   Subjective:    Patient ID: [name], female    DOB: 11-27-1960, 53 y.o.   MRN: [id number]  HPI The patient comes in today for followup of her pulmonary infiltrates of unknown origin. Her lung biopsy showed an alveolar filling process with hemosiderin laden macrophages. There was no evidence for vasculitis or other inflammatory findings. The patient has been treated with a course of prednisone for possible idiopathic pulmonary hemosiderosis, with improvement in her breathing. She is now down to 20 mg of prednisone a day, and feels that her breathing may only be slightly worse from the last visit. She denies any significant cough or mucus production. She has had no hemoptysis. She is describing upper airway wheezing at times, but has continued to stay away from cigarettes.   Review of Systems  Constitutional: Negative for fever and unexpected weight change.  HENT: Negative for congestion, dental problem, ear pain, nosebleeds, postnasal drip, rhinorrhea, sinus pressure, sneezing, sore throat and trouble swallowing.   Eyes: Negative for redness and itching.  Respiratory: Positive for shortness of breath. Negative for cough, chest tightness and wheezing.   Cardiovascular: Negative for palpitations and leg swelling.  Gastrointestinal: Negative for nausea and vomiting.  Genitourinary: Negative for dysuria.  Musculoskeletal: Negative for joint swelling.  Skin: Negative for rash.  Neurological: Negative for headaches.  Hematological: Does not bruise/bleed easily.  Psychiatric/Behavioral: Negative for dysphoric mood. The patient is not nervous/anxious.        Objective:   Physical Exam Morbidly obese female in no acute distress Nose without purulence or discharge noted. Neck without LN or TMG Chest with clear bs, no wheezing or rhonchi Cor with rrr LE with edema on right, none on left Alert and oriented, moves all 4.        Assessment & Plan:

## 2013-12-12 ENCOUNTER — Ambulatory Visit (INDEPENDENT_AMBULATORY_CARE_PROVIDER_SITE_OTHER)
Admission: RE | Admit: 2013-12-12 | Discharge: 2013-12-12 | Disposition: A | Discharge status: Home / Self Care | Payer: Medicare Other | Source: Ambulatory Visit | Attending: Pulmonary Disease | Admitting: Pulmonary Disease

## 2013-12-12 DIAGNOSIS — J984 Other disorders of lung: Secondary | ICD-10-CM | POA: Diagnosis not present

## 2013-12-12 DIAGNOSIS — R0489 Hemorrhage from other sites in respiratory passages: Secondary | ICD-10-CM

## 2013-12-12 DIAGNOSIS — R042 Hemoptysis: Secondary | ICD-10-CM | POA: Diagnosis not present

## 2013-12-19 ENCOUNTER — Telehealth: Payer: Self-pay | Admitting: Pulmonary Disease

## 2013-12-19 ENCOUNTER — Ambulatory Visit: Payer: Medicare Other | Admitting: Pulmonary Disease

## 2013-12-19 NOTE — Telephone Encounter (Signed)
Spoke with patient-- appt has been rescheduled to 12/27/13 for pt to see TP to review CT chest. Pt aware as per last OV instructions to continue Prednisone 15mg  daily until seen for follow up. Patient Instructions     Will decrease prednisone to 15mg  a day until I can review your followup scan of chest.  Will schedule for ct chest in next 2 weeks. Will call with results.    Nothing further needed.

## 2013-12-21 ENCOUNTER — Telehealth: Payer: Self-pay | Admitting: Pulmonary Disease

## 2013-12-21 NOTE — Telephone Encounter (Signed)
Anderson Malta, let pt know that I have not forgotten about her.  I was waiting to hear back from the radiologist so we could discuss further when I had to leave town for a family emergency.  Let her know I will take care of this once I get back.  You can tell her the scan is better, and I just want to discuss in more detail with the radiologist.  Thanks.

## 2013-12-21 NOTE — Telephone Encounter (Signed)
Called spoke with pt. Answered her question regarding follow up. Nothing further needed

## 2013-12-21 NOTE — Telephone Encounter (Signed)
Pt advised and is ok with this. [place] Bing, CMA

## 2013-12-27 ENCOUNTER — Ambulatory Visit: Payer: Medicare Other | Admitting: Adult Health

## 2014-01-08 ENCOUNTER — Ambulatory Visit: Payer: Medicare Other

## 2014-01-08 ENCOUNTER — Other Ambulatory Visit (HOSPITAL_COMMUNITY): Payer: Self-pay | Admitting: Cardiovascular Disease

## 2014-01-08 ENCOUNTER — Ambulatory Visit (HOSPITAL_COMMUNITY): Payer: Medicaid Other

## 2014-01-08 ENCOUNTER — Ambulatory Visit (HOSPITAL_COMMUNITY)
Admission: RE | Admit: 2014-01-08 | Discharge: 2014-01-08 | Disposition: A | Discharge status: Home / Self Care | Payer: Medicare Other | Source: Ambulatory Visit | Attending: Cardiology | Admitting: Cardiology

## 2014-01-08 ENCOUNTER — Ambulatory Visit
Admission: RE | Admit: 2014-01-08 | Discharge: 2014-01-08 | Disposition: A | Discharge status: Home / Self Care | Payer: Medicare Other | Source: Ambulatory Visit

## 2014-01-08 DIAGNOSIS — J8410 Pulmonary fibrosis, unspecified: Secondary | ICD-10-CM | POA: Diagnosis not present

## 2014-01-08 DIAGNOSIS — E785 Hyperlipidemia, unspecified: Secondary | ICD-10-CM | POA: Diagnosis not present

## 2014-01-08 DIAGNOSIS — I10 Essential (primary) hypertension: Secondary | ICD-10-CM | POA: Diagnosis not present

## 2014-01-08 DIAGNOSIS — I350 Nonrheumatic aortic (valve) stenosis: Secondary | ICD-10-CM

## 2014-01-08 DIAGNOSIS — Z1231 Encounter for screening mammogram for malignant neoplasm of breast: Secondary | ICD-10-CM | POA: Diagnosis not present

## 2014-01-08 DIAGNOSIS — I359 Nonrheumatic aortic valve disorder, unspecified: Secondary | ICD-10-CM | POA: Diagnosis not present

## 2014-01-08 DIAGNOSIS — G4733 Obstructive sleep apnea (adult) (pediatric): Secondary | ICD-10-CM | POA: Insufficient documentation

## 2014-01-08 NOTE — Progress Notes (Signed)
2D Echocardiogram Complete.  01/08/2014   [name], [place]

## 2014-01-14 ENCOUNTER — Telehealth: Payer: Self-pay | Admitting: Cardiovascular Disease

## 2014-01-15 ENCOUNTER — Encounter: Payer: Self-pay | Admitting: *Deleted

## 2014-01-15 ENCOUNTER — Telehealth: Payer: Self-pay | Admitting: *Deleted

## 2014-01-15 DIAGNOSIS — I350 Nonrheumatic aortic (valve) stenosis: Secondary | ICD-10-CM

## 2014-01-15 NOTE — Telephone Encounter (Signed)
Message copied by Chauncy Lean on Tue Jan 15, 2014  1:57 PM ------      Message from: Lorretta Harp      Created: Sun Jan 13, 2014  4:00 PM       Nl LV fxn. Mod AS. No change from prior study. Repeat in 12 months. ------

## 2014-01-15 NOTE — Telephone Encounter (Signed)
Order placed for repeat echo in 1 year 

## 2014-01-17 ENCOUNTER — Ambulatory Visit: Payer: Medicaid Other | Admitting: Cardiovascular Disease

## 2014-01-17 DIAGNOSIS — R0609 Other forms of dyspnea: Secondary | ICD-10-CM | POA: Diagnosis not present

## 2014-01-17 DIAGNOSIS — M79609 Pain in unspecified limb: Secondary | ICD-10-CM | POA: Diagnosis not present

## 2014-01-17 DIAGNOSIS — R0989 Other specified symptoms and signs involving the circulatory and respiratory systems: Secondary | ICD-10-CM | POA: Diagnosis not present

## 2014-01-17 DIAGNOSIS — M7989 Other specified soft tissue disorders: Secondary | ICD-10-CM | POA: Diagnosis not present

## 2014-01-17 DIAGNOSIS — M7520 Bicipital tendinitis, unspecified shoulder: Secondary | ICD-10-CM | POA: Diagnosis not present

## 2014-01-18 NOTE — Telephone Encounter (Signed)
Closed encounter °

## 2014-01-22 DIAGNOSIS — N183 Chronic kidney disease, stage 3 unspecified: Secondary | ICD-10-CM | POA: Diagnosis not present

## 2014-01-24 ENCOUNTER — Telehealth: Payer: Self-pay | Admitting: Pulmonary Disease

## 2014-01-24 NOTE — Telephone Encounter (Signed)
Rec'd from [facility] forward 5 pages to [facility]

## 2014-01-28 DIAGNOSIS — N2581 Secondary hyperparathyroidism of renal origin: Secondary | ICD-10-CM | POA: Diagnosis not present

## 2014-01-28 DIAGNOSIS — I129 Hypertensive chronic kidney disease with stage 1 through stage 4 chronic kidney disease, or unspecified chronic kidney disease: Secondary | ICD-10-CM | POA: Diagnosis not present

## 2014-01-28 DIAGNOSIS — N039 Chronic nephritic syndrome with unspecified morphologic changes: Secondary | ICD-10-CM | POA: Diagnosis not present

## 2014-01-28 DIAGNOSIS — D631 Anemia in chronic kidney disease: Secondary | ICD-10-CM | POA: Diagnosis not present

## 2014-01-28 DIAGNOSIS — N183 Chronic kidney disease, stage 3 unspecified: Secondary | ICD-10-CM | POA: Diagnosis not present

## 2014-02-11 DIAGNOSIS — R0602 Shortness of breath: Secondary | ICD-10-CM | POA: Diagnosis not present

## 2014-02-11 DIAGNOSIS — E785 Hyperlipidemia, unspecified: Secondary | ICD-10-CM | POA: Diagnosis not present

## 2014-02-11 DIAGNOSIS — J449 Chronic obstructive pulmonary disease, unspecified: Secondary | ICD-10-CM | POA: Diagnosis not present

## 2014-02-14 DIAGNOSIS — E785 Hyperlipidemia, unspecified: Secondary | ICD-10-CM | POA: Diagnosis not present

## 2014-02-27 ENCOUNTER — Encounter: Payer: Self-pay | Admitting: Pulmonary Disease

## 2014-02-27 ENCOUNTER — Ambulatory Visit (INDEPENDENT_AMBULATORY_CARE_PROVIDER_SITE_OTHER): Payer: Medicare Other | Admitting: Pulmonary Disease

## 2014-02-27 VITALS — BP 124/72 | HR 79 | Temp 97.6°F | Ht 66.5 in | Wt 336.8 lb

## 2014-02-27 DIAGNOSIS — R042 Hemoptysis: Secondary | ICD-10-CM

## 2014-02-27 DIAGNOSIS — R0489 Hemorrhage from other sites in respiratory passages: Secondary | ICD-10-CM

## 2014-02-27 NOTE — Patient Instructions (Signed)
Keep apptm with your cardiologist next week, and I will call you to discuss further plans once I hear from him. Keep working on weight loss as much as you can.

## 2014-02-27 NOTE — Progress Notes (Signed)
   Subjective:    Patient ID: [name], female    DOB: 1960/07/31, 53 y.o.   MRN: [id number]  HPI The patient comes in today for followup of her known nodular interstitial disease of unknown etiology. 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 She treated with a course of prednisone which has resulted in some improvement clinically and radiographically, but not significantly. She has also quit smoking, but has seen a progression of her shortness of breath. She denies any significant cough or mucus production. Complicating all of this, she has a history of aortic stenosis, and recently had a followup echocardiogram. She is scheduled to see her cardiologist next week. The patient was on 10 mg of prednisone at the last visit, and she has weaned herself off of this on her own.  I suspect the low dose was not helping her significantly.   Review of Systems  Constitutional: Negative for fever and unexpected weight change.  HENT: Negative for congestion, dental problem, ear pain, nosebleeds, postnasal drip, rhinorrhea, sinus pressure, sneezing, sore throat and trouble swallowing.   Eyes: Negative for redness and itching.  Respiratory: Positive for shortness of breath. Negative for cough, chest tightness and wheezing.   Cardiovascular: Negative for palpitations and leg swelling.  Gastrointestinal: Negative for nausea and vomiting.  Genitourinary: Negative for dysuria.  Musculoskeletal: Negative for joint swelling.  Skin: Negative for rash.  Neurological: Negative for headaches.  Hematological: Does not bruise/bleed easily.  Psychiatric/Behavioral: Negative for dysphoric mood. The patient is not nervous/anxious.        Objective:   Physical Exam Morbidly obese female in no acute distress Nose without purulence or discharge noted Neck without lymphadenopathy or thyromegaly Chest totally clear to auscultation, no  wheezing Cardiac exam with regular rate and rhythm Lower extremities with edema on the right that his chronic, the left is fairly normal. Alert and oriented, moves all 4 extremities.       Assessment & Plan:

## 2014-02-27 NOTE — Assessment & Plan Note (Signed)
The patient continues to have significant dyspnea on exertion, and it is unclear how much of this is related to her obesity and deconditioning versus her cardiopulmonary disease. She did desaturate today to 87% with walking through the office, but wishes to avoid oxygen for now. At this point, we have more of a pathologic finding on biopsy rather than a specific diagnosis. I guess this still could be idiopathic pulmonary hemosiderosis, but I would also wonder if she could have fractured capillaries from excessive hydrostatic pressure related to her aortic valve disease (similar to pulmonary hemorrhage associated with mitral valve disease). She has had a followup echocardiogram, and is to see her cardiologist next week. I have spoken with him, and given him further information about the case.  If he does not feel it is related to her valvular heart disease, I will consider referring her to an academic Center for a second opinion.  I have also stressed to the patient the importance of aggressive weight loss.

## 2014-02-28 ENCOUNTER — Encounter: Payer: Self-pay | Admitting: Pulmonary Disease

## 2014-03-05 ENCOUNTER — Encounter: Payer: Self-pay | Admitting: Cardiovascular Disease

## 2014-03-05 ENCOUNTER — Ambulatory Visit (INDEPENDENT_AMBULATORY_CARE_PROVIDER_SITE_OTHER): Payer: Medicare Other | Admitting: Cardiovascular Disease

## 2014-03-05 VITALS — BP 122/62 | HR 66 | Ht 66.5 in | Wt 334.4 lb

## 2014-03-05 DIAGNOSIS — I350 Nonrheumatic aortic (valve) stenosis: Secondary | ICD-10-CM | POA: Diagnosis not present

## 2014-03-05 DIAGNOSIS — E785 Hyperlipidemia, unspecified: Secondary | ICD-10-CM | POA: Diagnosis not present

## 2014-03-05 DIAGNOSIS — I10 Essential (primary) hypertension: Secondary | ICD-10-CM

## 2014-03-05 NOTE — Patient Instructions (Signed)
Your physician wants you to follow-up in: 1 year with Dr Berry. You will receive a reminder letter in the mail two months in advance. If you don't receive a letter, please call our office to schedule the follow-up appointment.  

## 2014-03-05 NOTE — Assessment & Plan Note (Signed)
Controlled on current medications 

## 2014-03-05 NOTE — Progress Notes (Signed)
03/05/2014 [name]   August 22, 1960  [id number]  Primary Physician [name], [place] Primary Cardiologist: Lorretta Harp MD Renae Gloss   HPI:  The patient is a 53 year old, severely overweight, widowed Caucasian female, mother of 1 child who I saw 5 months ago. 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 Her other problems include hypertension, hyperlipidemia and symptoms compatible with obstructive sleep apnea. She does smoke 10 cigarettes a day currently.Marland Kitchen Her most recent 2-D echocardiogram performed/15/15 revealed at least moderate to moderately severe aortic stenosis with a peak gradient of 70, mean of 43 a valve area of approximately 1 cm.  She does complain of increasing dyspnea on exertion. Unfortunately she also has had a CT scan of her chest that shows multiple nodules. She's had a negative bronchoscopy and ultimately underwent a VATS procedure by Dr. Roxan Hockey successfully without complication. The pathology came back with hemosiderin laden macro phages. Dr. Gwenette Greet was worried that she had pulmonary disease called idiopathic pulmonary hemosiderosis. He also questioned whether or not this is related to her valvular heart disease.since I saw her last she has stopped smoking. A recent 2-D echocardiogram performed 01/08/14 revealed normal LV systolic function, mild left ventricular hypertrophy with moderate aortic stenosis. Her peak aortic valve gradient was 45 and mean of 26 mm mercury. She did have stenosis as well.    Current Outpatient Prescriptions  Medication Sig Dispense Refill  . Acetaminophen (TYLENOL ARTHRITIS PAIN PO) Take 3 tablets by mouth 2 (two) times daily.      Marland Kitchen albuterol (PROVENTIL HFA;VENTOLIN HFA) 108 (90 BASE) MCG/ACT inhaler Inhale 2 puffs into the lungs 2 (two) times daily as needed for wheezing or shortness of breath.      . allopurinol (ZYLOPRIM) 300 MG tablet Take 300 mg by mouth  daily.       Marland Kitchen aspirin 81 MG tablet Take 81 mg by mouth daily.      Marland Kitchen BENICAR HCT 40-25 MG per tablet Take 1 tablet by mouth daily.       Marland Kitchen buPROPion (WELLBUTRIN XL) 300 MG 24 hr tablet Take 300 mg by mouth every morning.       . Calcium Carbonate-Vitamin D (CALCIUM-VITAMIN D) 600-200 MG-UNIT CAPS Take 1 tablet by mouth 2 (two) times daily.      . Cholecalciferol (VITAMIN D3) 2000 UNITS TABS Take 2,000 Units by mouth daily.       . citalopram (CELEXA) 20 MG tablet Take 20 mg by mouth daily.       . fish oil-omega-3 fatty acids 1000 MG capsule Take 1 g by mouth daily.      . furosemide (LASIX) 40 MG tablet Take 40 mg by mouth daily.       Marland Kitchen KLOR-CON M20 20 MEQ tablet Take 20 mEq by mouth daily.       . metoprolol succinate (TOPROL-XL) 50 MG 24 hr tablet Take 50 mg by mouth daily.       . montelukast (SINGULAIR) 10 MG tablet Take 10 mg by mouth at bedtime.      . pravastatin (PRAVACHOL) 80 MG tablet Take 80 mg by mouth daily.       . traMADol (ULTRAM) 50 MG tablet Take 50 mg by mouth every 6 (six) hours as needed.       No current facility-administered medications for this visit.    No Known Allergies  History   Social History  . Marital Status: Widowed  Spouse Name: N/A    Number of Children: 1  . Years of Education: N/A   Occupational History  . disabled    Social History Main Topics  . Smoking status: Former Smoker -- 1.00 packs/day for 35 years    Types: Cigarettes    Quit date: 10/03/2013  . Smokeless tobacco: Never Used  . Alcohol Use: No  . Drug Use: No  . Sexual Activity: Not on file   Other Topics Concern  . Not on file   Social History Narrative  . No narrative on file     Review of Systems: General: negative for chills, fever, night sweats or weight changes.  Cardiovascular: negative for chest pain, dyspnea on exertion, edema, orthopnea, palpitations, paroxysmal nocturnal dyspnea or shortness of breath Dermatological: negative for rash Respiratory:  negative for cough or wheezing Urologic: negative for hematuria Abdominal: negative for nausea, vomiting, diarrhea, bright red blood per rectum, melena, or hematemesis Neurologic: negative for visual changes, syncope, or dizziness All other systems reviewed and are otherwise negative except as noted above.    Blood pressure 122/62, pulse 66, height 5' 6.5" (1.689 m), weight 334 lb 6.4 oz (151.683 kg).  General appearance: alert and no distress Neck: no adenopathy, no JVD, supple, symmetrical, trachea midline, thyroid not enlarged, symmetric, no tenderness/mass/nodules and bilateral transmitted murmur versus carotid bruits Lungs: clear to auscultation bilaterally Heart: over 6 systolic ejection murmur consistent with aortic stenosis Extremities: extremities normal, atraumatic, no cyanosis or edema  EKG normal sinus rhythm at 66 with a nonspecific IVCD  ASSESSMENT AND PLAN:   Aortic stenosis [name] has moderate aortic stenosis which we have been following by 2-D echocardiogram. Her most recent echo performed 01/08/14 revealed normal LV systolic function, mild left hypertrophy with moderate aortic stenosis. Her peak gradient actually fell from 70-44 with a mean gradient of 26. She did have mild mitral stenosis as well. She had lung biopsies showed hemosiderin laden macrophages. Dr. Gwenette Greet was worried that this is related to her valvular heart disease.there was also question of idiopathic pulmonary hemosiderosis. I think her shortness of breath is probably multifactorial coming from her valvular heart disease, long history of tobacco abuse which he recently discontinued and her infiltrative process. I do not think that her valvular heart disease is severe enough to warrant intervention at this time.  Essential hypertension Controlled on current medications  Hyperlipidemia Statin intolerant. Followed by her PCP. She may be a candidate for PCSK9 subcutaneous monoclonal antibody  injectables.      Lorretta Harp MD FACP,FACC,FAHA, [facility] 03/05/2014 4:34 PM

## 2014-03-05 NOTE — Assessment & Plan Note (Signed)
Statin intolerant. Followed by her PCP. She may be a candidate for PCSK9 subcutaneous monoclonal antibody injectables.

## 2014-03-05 NOTE — Assessment & Plan Note (Signed)
[name] has moderate aortic stenosis which we have been following by 2-D echocardiogram. Her most recent echo performed 01/08/14 revealed normal LV systolic function, mild left hypertrophy with moderate aortic stenosis. Her peak gradient actually fell from 70-44 with a mean gradient of 26. She did have mild mitral stenosis as well. She had lung biopsies showed hemosiderin laden macrophages. Dr. Gwenette Greet was worried that this is related to her valvular heart disease.there was also question of idiopathic pulmonary hemosiderosis. I think her shortness of breath is probably multifactorial coming from her valvular heart disease, long history of tobacco abuse which he recently discontinued and her infiltrative process. I do not think that her valvular heart disease is severe enough to warrant intervention at this time.

## 2014-03-13 ENCOUNTER — Encounter: Payer: Self-pay | Admitting: Pulmonary Disease

## 2014-03-14 ENCOUNTER — Other Ambulatory Visit: Payer: Self-pay | Admitting: Pulmonary Disease

## 2014-03-14 DIAGNOSIS — R0489 Hemorrhage from other sites in respiratory passages: Secondary | ICD-10-CM

## 2014-03-14 DIAGNOSIS — R9389 Abnormal findings on diagnostic imaging of other specified body structures: Secondary | ICD-10-CM

## 2014-03-26 DIAGNOSIS — N183 Chronic kidney disease, stage 3 (moderate): Secondary | ICD-10-CM | POA: Diagnosis not present

## 2014-04-02 DIAGNOSIS — N183 Chronic kidney disease, stage 3 (moderate): Secondary | ICD-10-CM | POA: Diagnosis not present

## 2014-04-02 DIAGNOSIS — Z23 Encounter for immunization: Secondary | ICD-10-CM | POA: Diagnosis not present

## 2014-04-02 DIAGNOSIS — D631 Anemia in chronic kidney disease: Secondary | ICD-10-CM | POA: Diagnosis not present

## 2014-04-02 DIAGNOSIS — N2581 Secondary hyperparathyroidism of renal origin: Secondary | ICD-10-CM | POA: Diagnosis not present

## 2014-04-02 DIAGNOSIS — I129 Hypertensive chronic kidney disease with stage 1 through stage 4 chronic kidney disease, or unspecified chronic kidney disease: Secondary | ICD-10-CM | POA: Diagnosis not present

## 2014-05-02 ENCOUNTER — Telehealth: Payer: Self-pay

## 2014-05-02 DIAGNOSIS — R938 Abnormal findings on diagnostic imaging of other specified body structures: Secondary | ICD-10-CM | POA: Diagnosis not present

## 2014-05-02 DIAGNOSIS — J449 Chronic obstructive pulmonary disease, unspecified: Secondary | ICD-10-CM | POA: Diagnosis not present

## 2014-05-02 DIAGNOSIS — I080 Rheumatic disorders of both mitral and aortic valves: Secondary | ICD-10-CM | POA: Diagnosis not present

## 2014-05-02 DIAGNOSIS — J209 Acute bronchitis, unspecified: Secondary | ICD-10-CM | POA: Diagnosis not present

## 2014-05-02 NOTE — Telephone Encounter (Signed)
Pt is aware Cd's of CT are Ready For Pick Up/KM

## 2014-05-09 DIAGNOSIS — F17201 Nicotine dependence, unspecified, in remission: Secondary | ICD-10-CM | POA: Diagnosis not present

## 2014-05-09 DIAGNOSIS — Z13220 Encounter for screening for lipoid disorders: Secondary | ICD-10-CM | POA: Diagnosis not present

## 2014-05-09 DIAGNOSIS — I517 Cardiomegaly: Secondary | ICD-10-CM | POA: Diagnosis not present

## 2014-05-09 DIAGNOSIS — J8403 Idiopathic pulmonary hemosiderosis: Secondary | ICD-10-CM | POA: Diagnosis not present

## 2014-05-09 DIAGNOSIS — J634 Siderosis: Secondary | ICD-10-CM | POA: Diagnosis not present

## 2014-05-09 DIAGNOSIS — I080 Rheumatic disorders of both mitral and aortic valves: Secondary | ICD-10-CM | POA: Diagnosis not present

## 2014-05-09 DIAGNOSIS — R700 Elevated erythrocyte sedimentation rate: Secondary | ICD-10-CM | POA: Diagnosis not present

## 2014-05-09 DIAGNOSIS — E669 Obesity, unspecified: Secondary | ICD-10-CM | POA: Diagnosis not present

## 2014-05-09 DIAGNOSIS — J8410 Pulmonary fibrosis, unspecified: Secondary | ICD-10-CM | POA: Diagnosis not present

## 2014-07-01 HISTORY — PX: CARDIAC CATHETERIZATION: SHX172

## 2014-07-16 DIAGNOSIS — Q248 Other specified congenital malformations of heart: Secondary | ICD-10-CM | POA: Diagnosis not present

## 2014-07-16 DIAGNOSIS — E784 Other hyperlipidemia: Secondary | ICD-10-CM | POA: Diagnosis not present

## 2014-07-16 DIAGNOSIS — I050 Rheumatic mitral stenosis: Secondary | ICD-10-CM | POA: Diagnosis not present

## 2014-07-16 DIAGNOSIS — I890 Lymphedema, not elsewhere classified: Secondary | ICD-10-CM | POA: Diagnosis not present

## 2014-07-16 DIAGNOSIS — R0600 Dyspnea, unspecified: Secondary | ICD-10-CM | POA: Diagnosis not present

## 2014-07-16 DIAGNOSIS — I350 Nonrheumatic aortic (valve) stenosis: Secondary | ICD-10-CM | POA: Diagnosis not present

## 2014-07-16 DIAGNOSIS — I10 Essential (primary) hypertension: Secondary | ICD-10-CM | POA: Diagnosis not present

## 2014-07-16 DIAGNOSIS — I421 Obstructive hypertrophic cardiomyopathy: Secondary | ICD-10-CM | POA: Diagnosis not present

## 2014-07-25 DIAGNOSIS — I342 Nonrheumatic mitral (valve) stenosis: Secondary | ICD-10-CM | POA: Diagnosis not present

## 2014-07-25 DIAGNOSIS — I350 Nonrheumatic aortic (valve) stenosis: Secondary | ICD-10-CM | POA: Diagnosis not present

## 2014-07-30 DIAGNOSIS — I080 Rheumatic disorders of both mitral and aortic valves: Secondary | ICD-10-CM

## 2014-07-30 HISTORY — PX: AORTIC AND MITRAL VALVE REPLACEMENT: SHX5056

## 2014-07-30 HISTORY — DX: Rheumatic disorders of both mitral and aortic valves: I08.0

## 2014-08-05 DIAGNOSIS — J432 Centrilobular emphysema: Secondary | ICD-10-CM | POA: Diagnosis not present

## 2014-08-05 DIAGNOSIS — I272 Other secondary pulmonary hypertension: Secondary | ICD-10-CM | POA: Diagnosis not present

## 2014-08-05 DIAGNOSIS — I050 Rheumatic mitral stenosis: Secondary | ICD-10-CM | POA: Diagnosis not present

## 2014-08-05 DIAGNOSIS — Z6841 Body Mass Index (BMI) 40.0 and over, adult: Secondary | ICD-10-CM | POA: Diagnosis not present

## 2014-08-05 DIAGNOSIS — I10 Essential (primary) hypertension: Secondary | ICD-10-CM | POA: Diagnosis not present

## 2014-08-05 DIAGNOSIS — J45909 Unspecified asthma, uncomplicated: Secondary | ICD-10-CM | POA: Diagnosis present

## 2014-08-05 DIAGNOSIS — R918 Other nonspecific abnormal finding of lung field: Secondary | ICD-10-CM | POA: Diagnosis not present

## 2014-08-05 DIAGNOSIS — J449 Chronic obstructive pulmonary disease, unspecified: Secondary | ICD-10-CM | POA: Diagnosis present

## 2014-08-05 DIAGNOSIS — F329 Major depressive disorder, single episode, unspecified: Secondary | ICD-10-CM | POA: Diagnosis present

## 2014-08-05 DIAGNOSIS — I129 Hypertensive chronic kidney disease with stage 1 through stage 4 chronic kidney disease, or unspecified chronic kidney disease: Secondary | ICD-10-CM | POA: Diagnosis present

## 2014-08-05 DIAGNOSIS — I890 Lymphedema, not elsewhere classified: Secondary | ICD-10-CM | POA: Diagnosis present

## 2014-08-05 DIAGNOSIS — I959 Hypotension, unspecified: Secondary | ICD-10-CM | POA: Diagnosis not present

## 2014-08-05 DIAGNOSIS — Z87891 Personal history of nicotine dependence: Secondary | ICD-10-CM | POA: Diagnosis not present

## 2014-08-05 DIAGNOSIS — R509 Fever, unspecified: Secondary | ICD-10-CM | POA: Diagnosis not present

## 2014-08-05 DIAGNOSIS — I080 Rheumatic disorders of both mitral and aortic valves: Secondary | ICD-10-CM | POA: Diagnosis not present

## 2014-08-05 DIAGNOSIS — Z4682 Encounter for fitting and adjustment of non-vascular catheter: Secondary | ICD-10-CM | POA: Diagnosis not present

## 2014-08-05 DIAGNOSIS — N170 Acute kidney failure with tubular necrosis: Secondary | ICD-10-CM | POA: Diagnosis not present

## 2014-08-05 DIAGNOSIS — E784 Other hyperlipidemia: Secondary | ICD-10-CM | POA: Diagnosis present

## 2014-08-05 DIAGNOSIS — Z7901 Long term (current) use of anticoagulants: Secondary | ICD-10-CM | POA: Diagnosis not present

## 2014-08-05 DIAGNOSIS — J849 Interstitial pulmonary disease, unspecified: Secondary | ICD-10-CM | POA: Diagnosis present

## 2014-08-05 DIAGNOSIS — R4189 Other symptoms and signs involving cognitive functions and awareness: Secondary | ICD-10-CM | POA: Diagnosis not present

## 2014-08-05 DIAGNOSIS — I060 Rheumatic aortic stenosis: Secondary | ICD-10-CM | POA: Diagnosis not present

## 2014-08-05 DIAGNOSIS — J411 Mucopurulent chronic bronchitis: Secondary | ICD-10-CM | POA: Diagnosis not present

## 2014-08-05 DIAGNOSIS — I5043 Acute on chronic combined systolic (congestive) and diastolic (congestive) heart failure: Secondary | ICD-10-CM | POA: Diagnosis not present

## 2014-08-05 DIAGNOSIS — E875 Hyperkalemia: Secondary | ICD-10-CM | POA: Diagnosis not present

## 2014-08-05 DIAGNOSIS — D72829 Elevated white blood cell count, unspecified: Secondary | ICD-10-CM | POA: Diagnosis not present

## 2014-08-05 DIAGNOSIS — J90 Pleural effusion, not elsewhere classified: Secondary | ICD-10-CM | POA: Diagnosis not present

## 2014-08-05 DIAGNOSIS — J811 Chronic pulmonary edema: Secondary | ICD-10-CM | POA: Diagnosis not present

## 2014-08-05 DIAGNOSIS — Z952 Presence of prosthetic heart valve: Secondary | ICD-10-CM | POA: Diagnosis not present

## 2014-08-05 DIAGNOSIS — I4892 Unspecified atrial flutter: Secondary | ICD-10-CM | POA: Diagnosis present

## 2014-08-05 DIAGNOSIS — N183 Chronic kidney disease, stage 3 (moderate): Secondary | ICD-10-CM | POA: Diagnosis not present

## 2014-08-05 DIAGNOSIS — I442 Atrioventricular block, complete: Secondary | ICD-10-CM | POA: Diagnosis present

## 2014-08-05 DIAGNOSIS — Z452 Encounter for adjustment and management of vascular access device: Secondary | ICD-10-CM | POA: Diagnosis not present

## 2014-08-05 DIAGNOSIS — R570 Cardiogenic shock: Secondary | ICD-10-CM | POA: Diagnosis not present

## 2014-08-05 DIAGNOSIS — R5381 Other malaise: Secondary | ICD-10-CM | POA: Diagnosis not present

## 2014-08-05 DIAGNOSIS — M109 Gout, unspecified: Secondary | ICD-10-CM | POA: Diagnosis present

## 2014-08-05 DIAGNOSIS — J9811 Atelectasis: Secondary | ICD-10-CM | POA: Diagnosis not present

## 2014-08-05 DIAGNOSIS — I350 Nonrheumatic aortic (valve) stenosis: Secondary | ICD-10-CM | POA: Diagnosis not present

## 2014-08-05 DIAGNOSIS — M1A3290 Chronic gout due to renal impairment, unspecified elbow, without tophus (tophi): Secondary | ICD-10-CM | POA: Diagnosis not present

## 2014-08-05 DIAGNOSIS — I4891 Unspecified atrial fibrillation: Secondary | ICD-10-CM | POA: Diagnosis present

## 2014-08-05 DIAGNOSIS — J9621 Acute and chronic respiratory failure with hypoxia: Secondary | ICD-10-CM | POA: Diagnosis not present

## 2014-08-07 DIAGNOSIS — Z954 Presence of other heart-valve replacement: Secondary | ICD-10-CM

## 2014-08-07 DIAGNOSIS — Z952 Presence of prosthetic heart valve: Secondary | ICD-10-CM | POA: Insufficient documentation

## 2014-08-07 DIAGNOSIS — R0689 Other abnormalities of breathing: Secondary | ICD-10-CM | POA: Insufficient documentation

## 2014-08-07 DIAGNOSIS — G8918 Other acute postprocedural pain: Secondary | ICD-10-CM | POA: Insufficient documentation

## 2014-08-14 DIAGNOSIS — Z7901 Long term (current) use of anticoagulants: Secondary | ICD-10-CM

## 2014-08-14 DIAGNOSIS — I442 Atrioventricular block, complete: Secondary | ICD-10-CM | POA: Insufficient documentation

## 2014-08-14 DIAGNOSIS — I440 Atrioventricular block, first degree: Secondary | ICD-10-CM | POA: Insufficient documentation

## 2014-08-19 ENCOUNTER — Ambulatory Visit (INDEPENDENT_AMBULATORY_CARE_PROVIDER_SITE_OTHER): Payer: Medicare Other | Admitting: Pharmacist

## 2014-08-19 DIAGNOSIS — I350 Nonrheumatic aortic (valve) stenosis: Secondary | ICD-10-CM

## 2014-08-19 LAB — POCT INR: INR: 3.1

## 2014-08-26 ENCOUNTER — Ambulatory Visit (INDEPENDENT_AMBULATORY_CARE_PROVIDER_SITE_OTHER): Payer: Medicare Other | Admitting: *Deleted

## 2014-08-26 ENCOUNTER — Encounter: Payer: Self-pay | Admitting: Nurse Practitioner

## 2014-08-26 ENCOUNTER — Ambulatory Visit (INDEPENDENT_AMBULATORY_CARE_PROVIDER_SITE_OTHER): Payer: Medicare Other | Admitting: Nurse Practitioner

## 2014-08-26 ENCOUNTER — Other Ambulatory Visit: Payer: Self-pay

## 2014-08-26 ENCOUNTER — Telehealth: Payer: Self-pay | Admitting: *Deleted

## 2014-08-26 VITALS — BP 104/72 | HR 97 | Ht 66.5 in | Wt 316.0 lb

## 2014-08-26 DIAGNOSIS — I350 Nonrheumatic aortic (valve) stenosis: Secondary | ICD-10-CM

## 2014-08-26 DIAGNOSIS — I10 Essential (primary) hypertension: Secondary | ICD-10-CM | POA: Diagnosis not present

## 2014-08-26 DIAGNOSIS — Z79899 Other long term (current) drug therapy: Secondary | ICD-10-CM | POA: Diagnosis not present

## 2014-08-26 DIAGNOSIS — Z9889 Other specified postprocedural states: Secondary | ICD-10-CM

## 2014-08-26 DIAGNOSIS — Z954 Presence of other heart-valve replacement: Secondary | ICD-10-CM | POA: Diagnosis not present

## 2014-08-26 DIAGNOSIS — I4892 Unspecified atrial flutter: Secondary | ICD-10-CM

## 2014-08-26 DIAGNOSIS — Z952 Presence of prosthetic heart valve: Secondary | ICD-10-CM

## 2014-08-26 LAB — HEPATIC FUNCTION PANEL
ALT: 21 U/L (ref 0–35)
AST: 23 U/L (ref 0–37)
Albumin: 4.1 g/dL (ref 3.5–5.2)
Alkaline Phosphatase: 87 U/L (ref 39–117)
Bilirubin, Direct: 0.2 mg/dL (ref 0.0–0.3)
Total Bilirubin: 0.8 mg/dL (ref 0.2–1.2)
Total Protein: 8.1 g/dL (ref 6.0–8.3)

## 2014-08-26 LAB — CBC
HCT: 33.7 % — ABNORMAL LOW (ref 36.0–46.0)
Hemoglobin: 10.9 g/dL — ABNORMAL LOW (ref 12.0–15.0)
MCHC: 32.3 g/dL (ref 30.0–36.0)
MCV: 92.1 fl (ref 78.0–100.0)
Platelets: 379 10*3/uL (ref 150.0–400.0)
RBC: 3.66 Mil/uL — ABNORMAL LOW (ref 3.87–5.11)
RDW: 16.1 % — ABNORMAL HIGH (ref 11.5–15.5)
WBC: 9 10*3/uL (ref 4.0–10.5)

## 2014-08-26 LAB — BASIC METABOLIC PANEL
BUN: 28 mg/dL — ABNORMAL HIGH (ref 6–23)
CO2: 29 mEq/L (ref 19–32)
Calcium: 10.2 mg/dL (ref 8.4–10.5)
Chloride: 102 mEq/L (ref 96–112)
Creatinine, Ser: 1.69 mg/dL — ABNORMAL HIGH (ref 0.40–1.20)
GFR: 33.41 mL/min — ABNORMAL LOW (ref >60.00)
Glucose, Bld: 91 mg/dL (ref 70–99)
Potassium: 4.1 mEq/L (ref 3.5–5.1)
Sodium: 139 mEq/L (ref 135–145)

## 2014-08-26 LAB — POCT INR: INR: 3.9

## 2014-08-26 LAB — TSH: TSH: 5.73 u[IU]/mL — ABNORMAL HIGH (ref 0.35–4.50)

## 2014-08-26 NOTE — Progress Notes (Signed)
CARDIOLOGY OFFICE NOTE  Date:  08/26/2014    [name] Date of Birth: 1961-04-20 Medical Record [id number]  PCP:  [name], FNP  Cardiologist:  Gwenlyn Found    Chief Complaint  Patient presents with  . Palpitations    Work in visit - seen for Dr. Gwenlyn Found     History of Present Illness: [name] is a 54 y.o. female who presents today for a work in visit. Seen for Dr. Gwenlyn Found. She has had a history of valvular heart disease. Other issues as noted below. She has idiopathic pulmonary hemosiderosis.   Has recently undergone MVR and AVR per [name] at [facility]. On coumadin.  From CareEverywhere:  "Cath and echo showed severe aortic stenosis, moderate mitral stenosis, PHBP, very small LV cavity and small aortic root with preserved ventricular function and no significant coronary disease. The patient was taken to the operating room by 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 Does not go back to [facility] until next week. Thus added to the FLEX schedule.  Comes in today. Here with her twin sister [name]. Overall she has been doing ok. Was sent home on oxygen. She is on amiodarone. No longer on her metoprolol or Benicar. No chest pain. Not using any pain medicine. Needing stool softener. Feels her heart skipping and can hear it. Not really symptomatic with it. Little nausea. No tremor.    Past Medical History  Diagnosis Date  . Allergic rhinitis   . Noninfectious lymphedema   . Undiagnosed cardiac murmurs   . Mitral stenosis   . Morbid obesity   . Depressive disorder   . Menopausal symptoms   . Mitral and aortic heart valve diseases, unspecified   . Vitamin D deficiency   . HTN (hypertension)   . Hyperlipidemia   . CAO (chronic airflow obstruction)   . Gout     . Hypertriglyceridemia   . Sleep apnea   . Kidney disease     Dr. Graylon Gunning  . Anxiety   . Arthritis   . Shortness of breath     with exertion  . Aortic stenosis     Past Surgical History  Procedure Laterality Date  . Video bronchoscopy Bilateral 10/25/2012    Procedure: VIDEO BRONCHOSCOPY WITH FLUORO;  Surgeon: Kathee Delton, MD;  Location: [facility];  Service: Cardiopulmonary;  Laterality: Bilateral;  . Tonsillectomy    . Video assisted thoracoscopy Left 10/03/2013    Procedure: Left Video Assited Thoracoscopy;  Surgeon: Melrose Nakayama, MD;  Location: [place];  Service: Thoracic;  Laterality: Left;  . Lung biopsy Left 10/03/2013    Procedure: Left Lung Biopsy;  Surgeon: Melrose Nakayama, MD;  Location: [place];  Service: Thoracic;  Laterality: Left;     Medications: Current Outpatient Prescriptions  Medication Sig Dispense Refill  . Acetaminophen (TYLENOL ARTHRITIS PAIN PO) Take 3 tablets by mouth 2 (two) times daily.    Marland Kitchen albuterol (PROVENTIL HFA;VENTOLIN HFA) 108 (90 BASE) MCG/ACT inhaler Inhale 2 puffs into the lungs 2 (two) times daily as needed for wheezing or shortness of breath.    Marland Kitchen amiodarone (PACERONE) 200 MG tablet Take 200 mg by mouth 2 (two) times daily.    Marland Kitchen amoxicillin (AMOXIL) 500 MG capsule Take 4 capsules by mouth as needed. FOR DENTAL  WORK    . aspirin 81 MG tablet Take 81 mg by mouth daily.    Marland Kitchen buPROPion (WELLBUTRIN XL) 300 MG 24 hr tablet Take 300 mg by mouth every morning.     . Cholecalciferol (VITAMIN D3) 2000 UNITS TABS Take 2,000 Units by mouth daily.     . citalopram (CELEXA) 20 MG tablet Take 20 mg by mouth daily.     . furosemide (LASIX) 40 MG tablet Take 40 mg by mouth daily.     Marland Kitchen KLOR-CON M20 20 MEQ tablet Take 20 mEq by mouth daily.     . midodrine (PROAMATINE) 10 MG tablet Take 10 mg by mouth 3 (three) times daily.    . montelukast (SINGULAIR) 10 MG tablet Take 10 mg by mouth at bedtime.    . pravastatin (PRAVACHOL) 80 MG tablet Take 80  mg by mouth daily.     Marland Kitchen senna-docusate (SENOKOT-S) 8.6-50 MG per tablet Take 1 tablet by mouth as directed. 1-2 tabs daily prn for constipation    . warfarin (COUMADIN) 7.5 MG tablet Take 7.5 mg by mouth daily.    Marland Kitchen oxyCODONE (OXY IR/ROXICODONE) 5 MG immediate release tablet Take 5 mg by mouth as needed. For pain     No current facility-administered medications for this visit.    Allergies: No Known Allergies  Social History: The patient  reports that she quit smoking about 10 months ago. Her smoking use included Cigarettes. She has a 35 pack-year smoking history. She has never used smokeless tobacco. She reports that she does not drink alcohol or use illicit drugs.   Family History: The patient's family history includes Cancer in her mother; Emphysema in her mother; Heart disease in her father; Kidney disease in her father.   Review of Systems: Please see the history of present illness.   Otherwise, the review of systems is positive for palpitations.   All other systems are reviewed and negative.   Physical Exam: VS:  BP 104/72 mmHg  Pulse 97  Ht 5' 6.5" (1.689 m)  Wt 316 lb (143.337 kg)  BMI 50.25 kg/m2  SpO2 88% .  BMI Body mass index is 50.25 kg/(m^2).  Wt Readings from Last 3 Encounters:  08/26/14 316 lb (143.337 kg)  03/05/14 334 lb 6.4 oz (151.683 kg)  02/27/14 336 lb 12.8 oz (152.771 kg)    General: Pleasant. Obese white female who is in no acute distress.  HEENT: Normal but has missing teeth. Neck: Supple, no JVD, carotid bruits, or masses noted.  Cardiac: Irregular irregular rhythm. Rate is fair. Good valve clicks noted. No murmurs, rubs, or gallops. No edema. Her sternum looks ok.  Respiratory:  Lungs are clear to auscultation bilaterally with normal work of breathing.  GI: Soft and nontender.  MS: No deformity or atrophy. Gait and ROM intact. Skin: Warm and dry. Color is normal.  Neuro:  Strength and sensation are intact and no gross focal deficits noted.    Psych: Alert, appropriate and with normal affect.   LABORATORY DATA:  EKG:  EKG is ordered today. This demonstrates probable typical atrial flutter - rate of 97. Reviewed with Dr. Rayann Heman (DOD).  Lab Results  Component Value Date   WBC 8.8 10/05/2013   HGB 12.5 10/05/2013   HCT 39.5 10/05/2013   PLT 169 10/05/2013   GLUCOSE 139* 10/05/2013   ALT 18 10/05/2013   AST 19 10/05/2013   NA 141 10/05/2013   K 4.6 10/05/2013   CL 104 10/05/2013   CREATININE  1.10 10/05/2013   BUN 19 10/05/2013   CO2 26 10/05/2013   INR 3.9 08/26/2014    BNP (last 3 results) No results for input(s): BNP in the last 8760 hours.  ProBNP (last 3 results) No results for input(s): PROBNP in the last 8760 hours.   Other Studies Reviewed Today: Reviewed Care Everywhere   Assessment/Plan: 1. New atrial flutter - will increase her amiodarone to 200 mg BID. Check surveillance labs today. She is anticoagulated on coumadin. See back on Friday - may end up needing cardioversion but would like for her to be further out from her surgery.   2. Recent AVR and MVR - will get her echo updated.   3. Chronic coumadin - followed in the clinic here - recheck on Friday due to increase in amiodarone.  4. Obesity  She is asking to have a new cardiologist - does not feel comfortable with Dr. Gwenlyn Found. She really did not wish to go out of town for her surgery. I will see her back for now.   Current medicines are reviewed with the patient today.  The patient does not have concerns regarding medicines other than what has been noted above.  The following changes have been made:  See above.  Labs/ tests ordered today include:    Orders Placed This Encounter  Procedures  . Basic metabolic panel  . CBC  . TSH  . Hepatic function panel  . EKG 12-Lead  . 2D Echocardiogram without contrast     Disposition:   FU with me on Friday at 10 am with EKG and INR.  Patient is agreeable to this plan and will call if any  problems develop in the interim.   Signed: [name], RN, ANP-C 08/26/2014 11:43 AM  [place] [address] [place], [place]  [address] Phone: [phone] Fax: [phone]

## 2014-08-26 NOTE — Patient Instructions (Addendum)
We will be checking the following labs today BMET, CBC, TSH, HPF  Increase amiodarone to 200 mg twice a day  I will see you on Friday 08/30/14 at 10 Am  We will get an echocardiogram. 08/30/14 @ 11:30  Call the [place] office at [phone] if you have any questions, problems or concerns.

## 2014-08-26 NOTE — Telephone Encounter (Signed)
Called and made an appt for her to be seen in coumadin clinic on April 1st and pt states understanding.

## 2014-08-26 NOTE — Telephone Encounter (Signed)
-----   Message from [name], NP sent at 08/26/2014 11:36 AM EDT ----- Can we add her to your schedule for this Friday?  I am going to see her back then.  Increasing amiodarone today.  lori

## 2014-08-28 ENCOUNTER — Telehealth: Payer: Self-pay | Admitting: Nurse Practitioner

## 2014-08-28 NOTE — Telephone Encounter (Signed)
New message      Pt c/o medication issue:  1. Name of Medication: amiodarone 2. How are you currently taking this medication (dosage and times per day)? 200mg  bid 3. Are you having a reaction (difficulty breathing--STAT)? no  4. What is your medication issue? Medication is making pt nausea.  She is not vomiting.  What can pt take?

## 2014-08-28 NOTE — Telephone Encounter (Signed)
Spoke with pt & she had not been taking the Amiodarone with food each time. She will start taking it after meals.  She is aware she can sip some ginger ale but no Ginger candy b/c of the Coumadin. She can also use Tums or Mylanta. Pt appreciated the advice   Horton Chin RN

## 2014-08-30 ENCOUNTER — Other Ambulatory Visit (HOSPITAL_COMMUNITY): Payer: Self-pay | Admitting: Nurse Practitioner

## 2014-08-30 ENCOUNTER — Ambulatory Visit (INDEPENDENT_AMBULATORY_CARE_PROVIDER_SITE_OTHER): Payer: Medicare Other

## 2014-08-30 ENCOUNTER — Telehealth: Payer: Self-pay | Admitting: Nurse Practitioner

## 2014-08-30 ENCOUNTER — Encounter: Payer: Self-pay | Admitting: Nurse Practitioner

## 2014-08-30 ENCOUNTER — Ambulatory Visit (HOSPITAL_COMMUNITY): Payer: Medicare Other | Attending: Cardiology | Admitting: Cardiology

## 2014-08-30 ENCOUNTER — Ambulatory Visit (INDEPENDENT_AMBULATORY_CARE_PROVIDER_SITE_OTHER): Payer: Medicare Other | Admitting: Nurse Practitioner

## 2014-08-30 VITALS — BP 112/80 | HR 129 | Resp 20 | Ht 66.0 in | Wt 314.0 lb

## 2014-08-30 DIAGNOSIS — Z87891 Personal history of nicotine dependence: Secondary | ICD-10-CM | POA: Insufficient documentation

## 2014-08-30 DIAGNOSIS — I10 Essential (primary) hypertension: Secondary | ICD-10-CM | POA: Diagnosis not present

## 2014-08-30 DIAGNOSIS — I4892 Unspecified atrial flutter: Secondary | ICD-10-CM | POA: Diagnosis not present

## 2014-08-30 DIAGNOSIS — I350 Nonrheumatic aortic (valve) stenosis: Secondary | ICD-10-CM

## 2014-08-30 DIAGNOSIS — Z952 Presence of prosthetic heart valve: Secondary | ICD-10-CM

## 2014-08-30 DIAGNOSIS — E669 Obesity, unspecified: Secondary | ICD-10-CM | POA: Insufficient documentation

## 2014-08-30 DIAGNOSIS — E785 Hyperlipidemia, unspecified: Secondary | ICD-10-CM | POA: Diagnosis not present

## 2014-08-30 DIAGNOSIS — Z954 Presence of other heart-valve replacement: Secondary | ICD-10-CM | POA: Diagnosis not present

## 2014-08-30 LAB — POCT INR: INR: 2.2

## 2014-08-30 MED ORDER — METOPROLOL TARTRATE 12.5 MG HALF TABLET
25.0000 mg | ORAL_TABLET | Freq: Once | ORAL | Status: AC
  Administered 2014-08-30: 25 mg via ORAL

## 2014-08-30 MED ORDER — METOPROLOL TARTRATE 25 MG PO TABS
25.0000 mg | ORAL_TABLET | Freq: Two times a day (BID) | ORAL | Status: DC
Start: 2014-08-30 — End: 2014-09-12

## 2014-08-30 NOTE — Telephone Encounter (Signed)
ERROR medication Inquiry.

## 2014-08-30 NOTE — Addendum Note (Signed)
Addended by: [name] on: 08/30/2014 03:54 PM   Modules accepted: Orders

## 2014-08-30 NOTE — Progress Notes (Signed)
CARDIOLOGY OFFICE NOTE  Date:  08/30/2014    [name] Date of Birth: May 13, 1961 Medical Record [id number]  PCP:  [name], FNP  Cardiologist:  Gwenlyn Found  Chief Complaint  Patient presents with  . Atrial Flutter    5 day check - seen for Dr. Gwenlyn Found     History of Present Illness: [name] is a 54 y.o. female who presents today for a follow up visit. Seen for Dr. Gwenlyn Found. She has had a history of valvular heart disease. Other issues as noted below. She has idiopathic pulmonary hemosiderosis. She has CKD and is followed by Dr. Posey Pronto.   Has undergone MVR and AVR per [name] at [facility] in early March. On coumadin.  From CareEverywhere: "Cath and echo showed severe aortic stenosis, moderate mitral stenosis, PHBP, very small LV cavity and small aortic root with preserved ventricular function and no significant coronary disease. The patient was taken to the operating room by [name], on 08/06/2014 where she underwent Aortic valve replacement 82mm StJude Regent valve, mitral valve replacement 31mm StJude mechanical valve finding rheumatic disease."  Was in the coumadin clinic earlier this week and was not feeling well. I ended up seeing her in the [facility] clinic - she was in atrial flutter. Not very symptomatic. I increased her amiodarone. Discussed possible need for cardioversion.   Comes in today. Here with her twin sister [name]. She is doing pretty well. Feels a little stronger. Not dizzy or lightheaded. She knows her heart is beating faster - she can hear it. She is pretty asymptomatic with this. Little nausea with the increased dose of amiodarone but tolerable. Has no BP cuff at home.   Past Medical History  Diagnosis Date  . Allergic rhinitis   . Noninfectious lymphedema   . Undiagnosed cardiac murmurs   . Mitral stenosis   . Morbid obesity   . Depressive disorder   . Menopausal symptoms   . Mitral and aortic heart valve diseases, unspecified March 2016   s/p AVR with #19 St Jude and s/p MVR with 37mm St. Jude per [name] at [facility]  . Vitamin D deficiency   . HTN (hypertension)   . Hyperlipidemia   . CAO (chronic airflow obstruction)   . Gout   . Hypertriglyceridemia   . Sleep apnea   . Kidney disease     Dr. Graylon Gunning  . Anxiety   . Arthritis   . Shortness of breath     with exertion  . Aortic stenosis     Past Surgical History  Procedure Laterality Date  . Video bronchoscopy Bilateral 10/25/2012    Procedure: VIDEO BRONCHOSCOPY WITH FLUORO;  Surgeon: Kathee Delton, MD;  Location: [facility];  Service: Cardiopulmonary;  Laterality: Bilateral;  . Tonsillectomy    . Video assisted thoracoscopy Left 10/03/2013    Procedure: Left Video Assited Thoracoscopy;  Surgeon: Melrose Nakayama, MD;  Location: [place];  Service: Thoracic;  Laterality: Left;  . Lung biopsy Left 10/03/2013    Procedure: Left Lung Biopsy;  Surgeon: Melrose Nakayama, MD;  Location: [place];  Service: Thoracic;  Laterality: Left;     Medications: Current Outpatient Prescriptions  Medication Sig Dispense Refill  . Acetaminophen (TYLENOL ARTHRITIS PAIN PO) Take 3 tablets by mouth 2 (two) times daily.    Marland Kitchen albuterol (PROVENTIL HFA;VENTOLIN HFA) 108 (90 BASE) MCG/ACT inhaler Inhale 2 puffs into the lungs 2 (two) times daily as needed for wheezing or shortness of breath.    Marland Kitchen  amiodarone (PACERONE) 200 MG tablet Take 200 mg by mouth 2 (two) times daily.    Marland Kitchen amoxicillin (AMOXIL) 500 MG capsule Take 4 capsules by mouth as needed. FOR DENTAL WORK    . aspirin 81 MG tablet Take 81 mg by mouth daily.    Marland Kitchen buPROPion (WELLBUTRIN XL) 300 MG 24 hr tablet Take 300 mg by mouth every morning.     . Cholecalciferol (VITAMIN D3) 2000 UNITS TABS Take 2,000 Units by mouth daily.     . citalopram (CELEXA) 20 MG tablet Take 20 mg by mouth daily.     . furosemide (LASIX) 40 MG tablet Take 40 mg by mouth daily.     Marland Kitchen KLOR-CON M20 20 MEQ tablet Take 20 mEq by mouth daily.     .  midodrine (PROAMATINE) 10 MG tablet Take 10 mg by mouth 3 (three) times daily.    . montelukast (SINGULAIR) 10 MG tablet Take 10 mg by mouth at bedtime.    . pravastatin (PRAVACHOL) 80 MG tablet Take 80 mg by mouth daily.     Marland Kitchen senna-docusate (SENOKOT-S) 8.6-50 MG per tablet Take 1 tablet by mouth as directed. 1-2 tabs daily prn for constipation    . warfarin (COUMADIN) 7.5 MG tablet Take 7.5 mg by mouth daily.    . metoprolol tartrate (LOPRESSOR) 25 MG tablet Take 1 tablet (25 mg total) by mouth 2 (two) times daily. 60 tablet 3   No current facility-administered medications for this visit.    Allergies: No Known Allergies  Social History: The patient  reports that she quit smoking about 10 months ago. Her smoking use included Cigarettes. She has a 35 pack-year smoking history. She has never used smokeless tobacco. She reports that she does not drink alcohol or use illicit drugs.   Family History: The patient's family history includes Cancer in her mother; Emphysema in her mother; Heart disease in her father; Kidney disease in her father.   Review of Systems: Please see the history of present illness.    All other systems are reviewed and negative.   Physical Exam: VS:  BP 112/80 mmHg  Pulse 129  Resp 20  Ht 5\' 6"  (1.676 m)  Wt 314 lb (142.429 kg)  BMI 50.70 kg/m2  SpO2 93% .  BMI Body mass index is 50.7 kg/(m^2).  Wt Readings from Last 3 Encounters:  08/30/14 314 lb (142.429 kg)  08/26/14 316 lb (143.337 kg)  03/05/14 334 lb 6.4 oz (151.683 kg)    General: Pleasant. Well developed, well nourished and in no acute distress. She is morbidly obese.  HEENT: Normal but has missing teeth. Neck: Supple, no JVD, carotid bruits, or masses noted.  Cardiac: Irregular irregular rate and rhythm. Valves are crisp. No edema. Her sternum still has staples - little crusty but not infected.  Respiratory:  Lungs are clear to auscultation bilaterally with normal work of breathing.  GI: Soft and  nontender.  MS: No deformity or atrophy. Gait and ROM intact. Skin: Warm and dry. Color is normal.  Neuro:  Strength and sensation are intact and no gross focal deficits noted.  Psych: Alert, appropriate and with normal affect.   LABORATORY DATA:  EKG:  EKG is ordered today. This demonstrates atrial flutter - VR of 129.  Lab Results  Component Value Date   WBC 9.0 08/26/2014   HGB 10.9* 08/26/2014   HCT 33.7* 08/26/2014   PLT 379.0 08/26/2014   GLUCOSE 91 08/26/2014   ALT 21 08/26/2014  AST 23 08/26/2014   NA 139 08/26/2014   K 4.1 08/26/2014   CL 102 08/26/2014   CREATININE 1.69* 08/26/2014   BUN 28* 08/26/2014   CO2 29 08/26/2014   TSH 5.73* 08/26/2014   INR 2.2 08/30/2014   Lab Results  Component Value Date   INR 2.2 08/30/2014   INR 3.9 08/26/2014   INR 3.1 08/19/2014    BNP (last 3 results) No results for input(s): BNP in the last 8760 hours.  ProBNP (last 3 results) No results for input(s): PROBNP in the last 8760 hours.   Other Studies Reviewed Today:   Assessment/Plan: 1. New atrial flutter - remains on amiodarone to 200 mg BID. Rate is faster today - adding back Lopressor 25 mg BID - 1st dose given here in the office today. She is fairly asymptomatic - will more than likely need cardioversion but would like her a little further out from her surgery as previously discussed with Dr. Rayann Heman. Discussed with Dr. Angelena Form here today who is also in agreement.   2. Recent AVR and MVR - for echo later this morning - hopefully HR will slow for better imaging.   3. Chronic coumadin - followed in the clinic here - will need to be followed weekly.  4. Obesity  5. High risk therapy with amiodarone - TSH borderline - will need to follow.   6. History of HTN - actually on Midodrine from [facility] - discussed with Dr. Angelena Form - will continue for now but should be able to stop in the future.  Current medicines are reviewed with the patient today.  The patient does not  have concerns regarding medicines other than what has been noted above.  The following changes have been made:  See above.  Labs/ tests ordered today include:    Orders Placed This Encounter  Procedures  . EKG 12-Lead     Disposition:   FU with Richardson Dopp, PA in 2 weeks (I will be on vacation). I will be happy to see back. Will need to get established with a new cardiologist going forward (?[facility] or [facility]).   Patient is agreeable to this plan and will call if any problems develop in the interim.   Signed: [name], RN, ANP-C 08/30/2014 10:45 AM  [facility] [address] [place], [place]  [address] Phone: [phone] Fax: [phone]

## 2014-08-30 NOTE — Progress Notes (Signed)
Limited echo performed. 

## 2014-08-30 NOTE — Patient Instructions (Addendum)
Stay on your current medicines but I am adding Lopressor 25 mg to take twice a day - you have had your morning dose of this - the RX is at your drug store.  Try to monitor your BP and HR for me at home  INR weekly  Echo later this morning - we will call you next week with those results.  See Richardson Dopp PA in about 2 weeks  Call the [facility] office at [phone] if you have any questions, problems or concerns.

## 2014-09-04 ENCOUNTER — Other Ambulatory Visit: Payer: Self-pay | Admitting: Cardiovascular Disease

## 2014-09-04 DIAGNOSIS — Z7901 Long term (current) use of anticoagulants: Secondary | ICD-10-CM

## 2014-09-04 NOTE — Telephone Encounter (Signed)
Rx(s) sent to pharmacy electronically.  

## 2014-09-05 DIAGNOSIS — Z952 Presence of prosthetic heart valve: Secondary | ICD-10-CM | POA: Diagnosis not present

## 2014-09-05 DIAGNOSIS — I4581 Long QT syndrome: Secondary | ICD-10-CM | POA: Diagnosis not present

## 2014-09-05 DIAGNOSIS — I350 Nonrheumatic aortic (valve) stenosis: Secondary | ICD-10-CM | POA: Diagnosis not present

## 2014-09-05 DIAGNOSIS — R9431 Abnormal electrocardiogram [ECG] [EKG]: Secondary | ICD-10-CM | POA: Diagnosis not present

## 2014-09-05 DIAGNOSIS — I10 Essential (primary) hypertension: Secondary | ICD-10-CM | POA: Diagnosis not present

## 2014-09-05 DIAGNOSIS — I4892 Unspecified atrial flutter: Secondary | ICD-10-CM | POA: Diagnosis not present

## 2014-09-05 DIAGNOSIS — Z79899 Other long term (current) drug therapy: Secondary | ICD-10-CM | POA: Diagnosis not present

## 2014-09-05 DIAGNOSIS — Z8679 Personal history of other diseases of the circulatory system: Secondary | ICD-10-CM | POA: Diagnosis not present

## 2014-09-05 DIAGNOSIS — Z48812 Encounter for surgical aftercare following surgery on the circulatory system: Secondary | ICD-10-CM | POA: Diagnosis not present

## 2014-09-05 LAB — PROTIME-INR: INR: 3.1 — AB (ref <=1.1)

## 2014-09-06 ENCOUNTER — Ambulatory Visit (INDEPENDENT_AMBULATORY_CARE_PROVIDER_SITE_OTHER): Payer: Medicare Other | Admitting: Cardiovascular Disease

## 2014-09-06 DIAGNOSIS — I350 Nonrheumatic aortic (valve) stenosis: Secondary | ICD-10-CM

## 2014-09-12 ENCOUNTER — Ambulatory Visit (INDEPENDENT_AMBULATORY_CARE_PROVIDER_SITE_OTHER): Payer: Medicare Other | Admitting: Physician Assistant

## 2014-09-12 ENCOUNTER — Encounter: Payer: Self-pay | Admitting: *Deleted

## 2014-09-12 ENCOUNTER — Telehealth: Payer: Self-pay | Admitting: *Deleted

## 2014-09-12 ENCOUNTER — Ambulatory Visit (INDEPENDENT_AMBULATORY_CARE_PROVIDER_SITE_OTHER): Payer: Medicare Other

## 2014-09-12 ENCOUNTER — Encounter: Payer: Self-pay | Admitting: Physician Assistant

## 2014-09-12 VITALS — BP 110/78 | HR 95 | Ht 66.0 in | Wt 303.0 lb

## 2014-09-12 DIAGNOSIS — I10 Essential (primary) hypertension: Secondary | ICD-10-CM

## 2014-09-12 DIAGNOSIS — Z9889 Other specified postprocedural states: Secondary | ICD-10-CM

## 2014-09-12 DIAGNOSIS — I38 Endocarditis, valve unspecified: Secondary | ICD-10-CM

## 2014-09-12 DIAGNOSIS — Z79899 Other long term (current) drug therapy: Secondary | ICD-10-CM

## 2014-09-12 DIAGNOSIS — Z952 Presence of prosthetic heart valve: Secondary | ICD-10-CM

## 2014-09-12 DIAGNOSIS — I4892 Unspecified atrial flutter: Secondary | ICD-10-CM | POA: Diagnosis not present

## 2014-09-12 DIAGNOSIS — N289 Disorder of kidney and ureter, unspecified: Secondary | ICD-10-CM

## 2014-09-12 DIAGNOSIS — E785 Hyperlipidemia, unspecified: Secondary | ICD-10-CM

## 2014-09-12 DIAGNOSIS — I350 Nonrheumatic aortic (valve) stenosis: Secondary | ICD-10-CM

## 2014-09-12 DIAGNOSIS — Z954 Presence of other heart-valve replacement: Secondary | ICD-10-CM

## 2014-09-12 LAB — POCT INR: INR: 4.7

## 2014-09-12 NOTE — Progress Notes (Addendum)
Cardiology Office Note   Date:  09/12/2014   ID:  DLISA KALDENBERG, DOB 04-Aug-1960, MRN [id number]  PCP:  [name], FNP  Cardiologist:  Dr. Quay Burow     Chief Complaint  Patient presents with  . Atrial Flutter     History of Present Illness: [name] is a 54 y.o. female with a hx of valvular heart disease, idiopathic pulmonary hemosiderosis, CKD, HTN, HL, OSA.  She underwent mechanical MVR and mechanical AVR with [name] at [facility] 07/2014.  Recently seen by [name], NP and noted to be in AFlutter.  Last seen 08/30/14.  Rate was uncontrolled.  Amiodarone dose was increased as well as the beta-blocker.  She is brought back for FU today with an eye towards DCCV if she remains in AFlutter.    From CareEverywhere: "Cath and echo showed severe aortic stenosis, moderate mitral stenosis, PHBP, very small LV cavity and small aortic root with preserved ventricular function and no significant coronary disease. The patient was taken to the operating room by [name], on 08/06/2014 where she underwent Aortic valve replacement 59mm StJude Regent valve, mitral valve replacement 69mm StJude mechanical valve finding rheumatic disease."   Recent Labs  08/19/14 0944 08/26/14 0742 08/30/14 0948 09/05/14 09/12/14 1133  INR 3.1 3.9 2.2 3.1* 4.7    She is still in AFlutter.  Saw [name] recently in FU.  They adjusted her Metoprolol to 50 mg Twice daily.  She feels weak.  Also notes some nausea, skin tingling.  She feels worse since starting on the higher dose of Metoprolol.  Her chest soreness is improved. She feels like her breathing is improved.  She is now NYHA 2b.  Still wears O2 when she ambulates.  She denies syncope. She denies orthopnea, PND.  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  Studies/Reports Reviewed Today:  Echo 08/30/14 -Mild concentric hypertrophy. EF 55% to 60%. Regional wall motion abnormalities cannot be excluded. - Aortic valve: A mechanical  prosthesis was present. Mean gradient (S): 18 mm Hg. Peak gradient (S): 41 mm Hg. - Aortic root: The aortic root was mildly dilated. - Mitral valve: A mechanical prosthesis was present. Peak gradient (D): 15 mm Hg. - Left atrium: The atrium was mildly dilated. - Pericardium, extracardiac: A small pericardial effusion was identified. Impressions: - Technically difficult; Normal LV functon; s/p AVR and MVR; small  pericardial effusion.  Carotid US 4/14 bilat ICA 0-49%   Past Medical History  Diagnosis Date  . Allergic rhinitis   . Noninfectious lymphedema   . Undiagnosed cardiac murmurs   . Mitral stenosis   . Morbid obesity   . Depressive disorder   . Menopausal symptoms   . Mitral and aortic heart valve diseases, unspecified March 2016    s/p AVR with #19 St Jude and s/p MVR with 64mm St. Jude per [name] at [facility]  . Vitamin D deficiency   . HTN (hypertension)   . Hyperlipidemia   . CAO (chronic airflow obstruction)   . Gout   . Hypertriglyceridemia   . Sleep apnea   . Kidney disease     Dr. Graylon Gunning  . Anxiety   . Arthritis   . Shortness of breath     with exertion  . Aortic stenosis     Past Surgical History  Procedure Laterality Date  . Video bronchoscopy Bilateral 10/25/2012    Procedure: VIDEO BRONCHOSCOPY WITH FLUORO;  Surgeon: Kathee Delton, MD;  [facility];  Service: Cardiopulmonary;  Laterality: Bilateral;  . Tonsillectomy    . Video assisted thoracoscopy Left 10/03/2013    Procedure: Left Video Assited Thoracoscopy;  Surgeon: Melrose Nakayama, MD;  Location: [place];  Service: Thoracic;  Laterality: Left;  . Lung biopsy Left 10/03/2013    Procedure: Left Lung Biopsy;  Surgeon: Melrose Nakayama, MD;  Location: [place];  Service: Thoracic;  Laterality: Left;     Current Outpatient Prescriptions  Medication Sig Dispense Refill  . Acetaminophen (TYLENOL ARTHRITIS PAIN PO) Take 650 mg by mouth 2 (two) times daily.     Marland Kitchen albuterol (PROVENTIL  HFA;VENTOLIN HFA) 108 (90 BASE) MCG/ACT inhaler Inhale 2 puffs into the lungs 2 (two) times daily as needed for wheezing or shortness of breath.    Marland Kitchen amiodarone (PACERONE) 200 MG tablet Take 1 tablet (200 mg total) by mouth 2 (two) times daily. 60 tablet 6  . aspirin 81 MG tablet Take 81 mg by mouth daily.    Marland Kitchen buPROPion (WELLBUTRIN XL) 300 MG 24 hr tablet Take 300 mg by mouth every morning.     . Cholecalciferol (VITAMIN D3) 2000 UNITS TABS Take 2,000 Units by mouth daily.     . citalopram (CELEXA) 20 MG tablet Take 20 mg by mouth daily.     . furosemide (LASIX) 40 MG tablet Take 40 mg by mouth daily.     Marland Kitchen KLOR-CON M20 20 MEQ tablet Take 20 mEq by mouth daily.     . metoprolol tartrate (LOPRESSOR) 25 MG tablet Take 50 mg by mouth 2 (two) times daily.    . midodrine (PROAMATINE) 10 MG tablet Take 10 mg by mouth 3 (three) times daily.    . montelukast (SINGULAIR) 10 MG tablet Take 10 mg by mouth at bedtime.    . pravastatin (PRAVACHOL) 80 MG tablet Take 80 mg by mouth daily.     Marland Kitchen senna-docusate (SENOKOT-S) 8.6-50 MG per tablet Take 1 tablet by mouth as directed. 1-2 tabs daily prn for constipation    . warfarin (COUMADIN) 7.5 MG tablet Take 7.5 mg by mouth daily.     No current facility-administered medications for this visit.    Allergies:   Review of patient's allergies indicates no known allergies.    Social History:  The patient  reports that she quit smoking about 11 months ago. Her smoking use included Cigarettes. She has a 35 pack-year smoking history. She has never used smokeless tobacco. She reports that she does not drink alcohol or use illicit drugs.   Family History:  The patient's family history includes Cancer in her mother; Emphysema in her mother; Heart disease in her father; Hypertension in her brother, father, mother, and sister; Kidney disease in her father; Stroke in her brother. There is no history of Heart attack.    ROS:   Please see the history of present illness.    Review of Systems  Gastrointestinal: Positive for nausea.  Neurological: Positive for paresthesias.  Psychiatric/Behavioral: The patient is nervous/anxious.   All other systems reviewed and are negative.    PHYSICAL EXAM: VS:  BP 110/78 mmHg  Pulse 95  Ht 5\' 6"  (1.676 m)  Wt 303 lb (137.44 kg)  BMI 48.93 kg/m2    Wt Readings from Last 3 Encounters:  09/12/14 303 lb (137.44 kg)  08/30/14 314 lb (142.429 kg)  08/26/14 316 lb (143.337 kg)     GEN: Well nourished, well developed, in no acute distress HEENT: normal Neck: no JVD, no masses Cardiac:  Mechanical S1/ Mechanical S2, irreg irreg rhythm; no murmur ,  no rubs or gallops, 2+ edema R leg Respiratory:  clear to auscultation bilaterally, no wheezing, rhonchi or rales. GI: soft, nontender, nondistended, + BS MS: no deformity or atrophy Skin: warm and dry  Neuro:  CNs II-XII intact, Strength and sensation are intact Psych: Normal affect   EKG:  EKG is ordered today.  It demonstrates:   Typical Atrial flutter, HR 95   Recent Labs: 08/26/2014: ALT 21; BUN 28*; Creatinine 1.69*; Hemoglobin 10.9*; Platelets 379.0; Potassium 4.1; Sodium 139; TSH 5.73*    Lipid Panel No results found for: CHOL, TRIG, HDL, CHOLHDL, VLDL, LDLCALC, LDLDIRECT    ASSESSMENT AND PLAN:  Atrial flutter, unspecified She remains in AFlutter.  HR is better controlled.  She is having symptoms of nausea, weakness, dizziness.  No syncope.  I suspect her symptoms are related to the Amiodarone.  I tried to arrange DCCV for tomorrow as her INR is therapeutic today.  She cannot come back tomorrow.  Will plan DCCV next week.  Will get labs the day before and repeat INR and ECG.  I would rather have her remain on her current dose of Amio for now.  We can have her decrease Amio to 200 mg QD on the day of her DCCV.  Will also cut back on Metoprolol to 25 mg bid that day as well.  If she has recurrent AFlutter, consider EP evaluation as it is typical and would likely  be amenable to RFCA.    Valvular heart disease S/P AVR and S/P MVR (mitral valve repair) Continue coumadin.  Refer to Cardiac Rehab.  Continue SBE prophylaxis.  Recent echo with stable MVR and AVR.  Essential hypertension Controlled.  She is on Midodrine due to low BP after surgery.  Will need to eventually ween her off of this.  Hyperlipidemia Continue statin.  High risk medication use - Amiodarone Will get TSH and LFTs next week with BMET and CBC.  Renal Insufficiency Repeat BMET next week as noted above.   Current medicines are reviewed at length with the patient today.  The patient has concerns regarding medicines.  The following changes have been made:  no change  Labs/ tests ordered today include:   Orders Placed This Encounter  Procedures  . Basic Metabolic Panel (BMET)  . CBC  . TSH  . Hepatic function panel  . EKG 12-Lead    Disposition:   FU with [name], NP 2 weeks.    Signed, Versie Starks, MHS 09/12/2014 12:04 PM    [facility] [address], [place], [place]  [address] Phone: [phone]; Fax: [phone]

## 2014-09-12 NOTE — Addendum Note (Signed)
Addended byKathlen Mody, Nicki Reaper T on: 09/12/2014 05:57 PM   Modules accepted: Orders

## 2014-09-12 NOTE — Patient Instructions (Addendum)
Medication Instructions:    STARTING 09/18/14 START TAKING AMIODARONE 200 MG ONCE A DAY  AND METOPROLOL 25 MG  TWICE A DAY    Labwork:  LABS TO BE DRAWN  09/18/14    BMET CBC TSH  AND  LFT  ORDERS IN EPIC AND NEEDS TO BE SCHEDULED AND LINKED  Testing/Procedures:  YOU HAVE BEEN SCHEDULED FOR A CARDIOVERSION FOR 09/19/14 @ 1PM  WITH DR CROITORU    PLEASE ARRIVE AT [facility] AT 10:30 AM   YOU MAY HAVE NOTHING TO EAT OR DRINK AFTER MIDNIGHT EXCEPT WATER  YOU MAY HAVE SOMETHING TO DRINK ONLY WATER ALONG WITH TAKING YOUR MEDICINE BEFORE THE  SCHEDULED  PROCEDURE  TIME   MAKE SURE YOU HAVE SOMEONE TO DRIVE YOU HOME AFTER YOUR DISCHARGE   Your physician has recommended that you have a Cardioversion (DCCV). Electrical Cardioversion uses a jolt of electricity to your heart either through paddles or wired patches attached to your chest. This is a controlled, usually prescheduled, procedure. Defibrillation is done under light anesthesia in the hospital, and you usually go home the day of the procedure. This is done to get your heart back into a normal rhythm. You are not awake for the procedure. Please see the instruction sheet given to you today.   Follow-Up:  WITH  LORI GERHARDT IN 2  TO 3 WEEKS   Any Other Special Instructions Will Be Listed Below (If Applicable).  YOU ARE BEING REFERRED TO CARDIAC REHAB IN [facility]   SOME ONE WILL CONTACT YOU FROM THE REHAB FACILITY FOR FURTHER STEPS

## 2014-09-13 ENCOUNTER — Telehealth: Payer: Self-pay | Admitting: Cardiovascular Disease

## 2014-09-13 NOTE — Telephone Encounter (Signed)
New Message  Pt wanted to speak w/ rn about preparations for cardioversion sched for 4/21. Please call back and dsicuss.

## 2014-09-13 NOTE — Telephone Encounter (Signed)
I called pt per Brynda Rim. PA  to make sure pt is getting BMET CBC TSHand LFT the day before DCCV. Pt gettting INR day of per PA states ok. Pt coming in 4/20 for labs , 4/21 for INR. I do not see DCCV instructions. Advised will go over inst 4/18, pt ok. Pt also c/o nausea since being on the metoprolol 50 mg BID and wants to see if she can cut back metoprolol to 25 mg BID. I advised no doctor's are in the office now. Advised pt if nausea bad still over the weekend then call the on call dr and they can advise her as to what she can do with her medications. Pt agreeable to plan of care.

## 2014-09-15 NOTE — Telephone Encounter (Signed)
Patient called today with complains of weakness, dizziness, and severe nausea. Is taking amiodarone 200 mg BID and metoprolol 25 mg BID. Scheduled for DCCV on Thursday September 19, 2014.  For a couple of days the symptoms have worsened. BP 118/76, HR 96.  I have asked her to decrease amiodarone to 200 mg daily as she has been on 200 mg BID for 3 weeks. Continue lopressor 25 mg BID. She is to hold amiodarone today and begin lower dose tomorrow. She is to tell nurses who will be drawing her blood this week of the dose change.  Call back if symptoms worsen.

## 2014-09-17 ENCOUNTER — Telehealth: Payer: Self-pay | Admitting: Nurse Practitioner

## 2014-09-17 NOTE — Telephone Encounter (Signed)
Pt aware and stated verbal understanding is agreeable to plan will cut amiodarone back to ( 100 mg ) one half tablet daily.  Will see what happens with the cardioversion.

## 2014-09-17 NOTE — Telephone Encounter (Signed)
S/w pt is still nausea from amiodarone.  Called on call doctor Sunday stated to start zofran 4 mg prn,  Pt stated does not help. Was told to hold amiodarone Sunday and felt a little better.  Having cardioversion soon stated will probably have to hold out till test.  Will talk with [name], NP to advise.

## 2014-09-17 NOTE — Telephone Encounter (Signed)
New message     Pt c/o medication issue:  1. Name of Medication: amiodarone 2. How are you currently taking this medication (dosage and times per day)? 200mg  daily 3. Are you having a reaction (difficulty breathing--STAT)? no  4. What is your medication issue?  Medication is making her nausea all day---please advise

## 2014-09-17 NOTE — Telephone Encounter (Signed)
Agree. Continue Amiodarone 200 mg Once daily for now. Proceed with DCCV this week as planned. Richardson Dopp, PA-C   09/17/2014 12:50 PM

## 2014-09-17 NOTE — Telephone Encounter (Signed)
Cut amiodarone to half dose - 100 mg a day.  Would proceed on with cardioversion.   May end up needing to see EP for ablation if cardioversion fails.

## 2014-09-17 NOTE — Telephone Encounter (Signed)
See phone note from [name], NP today.

## 2014-09-18 ENCOUNTER — Other Ambulatory Visit: Payer: Medicare Other

## 2014-09-18 ENCOUNTER — Telehealth: Payer: Self-pay | Admitting: *Deleted

## 2014-09-18 ENCOUNTER — Other Ambulatory Visit (INDEPENDENT_AMBULATORY_CARE_PROVIDER_SITE_OTHER): Payer: Medicare Other | Admitting: *Deleted

## 2014-09-18 DIAGNOSIS — Z79899 Other long term (current) drug therapy: Secondary | ICD-10-CM | POA: Diagnosis not present

## 2014-09-18 DIAGNOSIS — I10 Essential (primary) hypertension: Secondary | ICD-10-CM

## 2014-09-18 DIAGNOSIS — I4892 Unspecified atrial flutter: Secondary | ICD-10-CM

## 2014-09-18 DIAGNOSIS — I350 Nonrheumatic aortic (valve) stenosis: Secondary | ICD-10-CM

## 2014-09-18 LAB — BASIC METABOLIC PANEL
BUN: 42 mg/dL — AB (ref 6–23)
CALCIUM: 10.2 mg/dL (ref 8.4–10.5)
CHLORIDE: 100 meq/L (ref 96–112)
CO2: 28 meq/L (ref 19–32)
CREATININE: 2.28 mg/dL — AB (ref 0.40–1.20)
GFR: 23.65 mL/min — ABNORMAL LOW (ref >60.00)
Glucose, Bld: 111 mg/dL — ABNORMAL HIGH (ref 70–99)
Potassium: 4.9 mEq/L (ref 3.5–5.1)
Sodium: 135 mEq/L (ref 135–145)

## 2014-09-18 LAB — HEPATIC FUNCTION PANEL
ALK PHOS: 84 U/L (ref 39–117)
ALT: 18 U/L (ref 0–35)
AST: 20 U/L (ref 0–37)
Albumin: 4.2 g/dL (ref 3.5–5.2)
BILIRUBIN DIRECT: 0.3 mg/dL (ref 0.0–0.3)
Total Bilirubin: 1.1 mg/dL (ref 0.2–1.2)
Total Protein: 7.8 g/dL (ref 6.0–8.3)

## 2014-09-18 LAB — CBC
HEMATOCRIT: 33 % — AB (ref 36.0–46.0)
Hemoglobin: 10.6 g/dL — ABNORMAL LOW (ref 12.0–15.0)
MCHC: 32.2 g/dL (ref 30.0–36.0)
MCV: 85.9 fl (ref 78.0–100.0)
Platelets: 394 10*3/uL (ref 150.0–400.0)
RBC: 3.84 Mil/uL — ABNORMAL LOW (ref 3.87–5.11)
RDW: 17.6 % — ABNORMAL HIGH (ref 11.5–15.5)
WBC: 10.8 10*3/uL — AB (ref 4.0–10.5)

## 2014-09-18 LAB — TSH: TSH: 3.57 u[IU]/mL (ref 0.35–4.50)

## 2014-09-18 NOTE — Addendum Note (Signed)
Addended by: Eulis Foster on: 09/18/2014 08:05 AM   Modules accepted: Orders

## 2014-09-18 NOTE — Telephone Encounter (Signed)
Patient notified of lab results and instructions from [place], [place], to HOLD Lasix and K+ and repeat BMET on Monday, April 4/25. Patient verbalized understanding and agreement to continue with current treatment plan.

## 2014-09-19 ENCOUNTER — Ambulatory Visit (HOSPITAL_COMMUNITY): Payer: Medicare Other | Admitting: Anesthesiology

## 2014-09-19 ENCOUNTER — Encounter (HOSPITAL_COMMUNITY): Payer: Self-pay | Admitting: *Deleted

## 2014-09-19 ENCOUNTER — Ambulatory Visit (HOSPITAL_COMMUNITY)
Admission: RE | Admit: 2014-09-19 | Discharge: 2014-09-19 | Disposition: A | Discharge status: Home / Self Care | Payer: Medicare Other | Source: Ambulatory Visit | Attending: Cardiovascular Disease | Admitting: Cardiovascular Disease

## 2014-09-19 ENCOUNTER — Ambulatory Visit (INDEPENDENT_AMBULATORY_CARE_PROVIDER_SITE_OTHER): Payer: Medicare Other | Admitting: *Deleted

## 2014-09-19 ENCOUNTER — Other Ambulatory Visit: Payer: Medicare Other

## 2014-09-19 ENCOUNTER — Encounter (HOSPITAL_COMMUNITY)
Admission: RE | Disposition: A | Discharge status: Home / Self Care | Payer: Medicare Other | Source: Ambulatory Visit | Attending: Cardiovascular Disease

## 2014-09-19 DIAGNOSIS — F419 Anxiety disorder, unspecified: Secondary | ICD-10-CM | POA: Insufficient documentation

## 2014-09-19 DIAGNOSIS — R9431 Abnormal electrocardiogram [ECG] [EKG]: Secondary | ICD-10-CM | POA: Insufficient documentation

## 2014-09-19 DIAGNOSIS — J449 Chronic obstructive pulmonary disease, unspecified: Secondary | ICD-10-CM | POA: Insufficient documentation

## 2014-09-19 DIAGNOSIS — N189 Chronic kidney disease, unspecified: Secondary | ICD-10-CM | POA: Insufficient documentation

## 2014-09-19 DIAGNOSIS — Z7982 Long term (current) use of aspirin: Secondary | ICD-10-CM | POA: Diagnosis not present

## 2014-09-19 DIAGNOSIS — M1990 Unspecified osteoarthritis, unspecified site: Secondary | ICD-10-CM | POA: Insufficient documentation

## 2014-09-19 DIAGNOSIS — Z87891 Personal history of nicotine dependence: Secondary | ICD-10-CM | POA: Diagnosis not present

## 2014-09-19 DIAGNOSIS — Z7901 Long term (current) use of anticoagulants: Secondary | ICD-10-CM | POA: Insufficient documentation

## 2014-09-19 DIAGNOSIS — Z9981 Dependence on supplemental oxygen: Secondary | ICD-10-CM | POA: Diagnosis not present

## 2014-09-19 DIAGNOSIS — I4892 Unspecified atrial flutter: Secondary | ICD-10-CM | POA: Insufficient documentation

## 2014-09-19 DIAGNOSIS — I440 Atrioventricular block, first degree: Secondary | ICD-10-CM | POA: Diagnosis not present

## 2014-09-19 DIAGNOSIS — E781 Pure hyperglyceridemia: Secondary | ICD-10-CM | POA: Diagnosis not present

## 2014-09-19 DIAGNOSIS — I129 Hypertensive chronic kidney disease with stage 1 through stage 4 chronic kidney disease, or unspecified chronic kidney disease: Secondary | ICD-10-CM | POA: Diagnosis not present

## 2014-09-19 DIAGNOSIS — Z6841 Body Mass Index (BMI) 40.0 and over, adult: Secondary | ICD-10-CM | POA: Insufficient documentation

## 2014-09-19 DIAGNOSIS — J45909 Unspecified asthma, uncomplicated: Secondary | ICD-10-CM | POA: Insufficient documentation

## 2014-09-19 DIAGNOSIS — I517 Cardiomegaly: Secondary | ICD-10-CM | POA: Insufficient documentation

## 2014-09-19 DIAGNOSIS — E559 Vitamin D deficiency, unspecified: Secondary | ICD-10-CM | POA: Insufficient documentation

## 2014-09-19 DIAGNOSIS — Z79899 Other long term (current) drug therapy: Secondary | ICD-10-CM | POA: Insufficient documentation

## 2014-09-19 DIAGNOSIS — G4733 Obstructive sleep apnea (adult) (pediatric): Secondary | ICD-10-CM | POA: Insufficient documentation

## 2014-09-19 DIAGNOSIS — F329 Major depressive disorder, single episode, unspecified: Secondary | ICD-10-CM | POA: Insufficient documentation

## 2014-09-19 DIAGNOSIS — M109 Gout, unspecified: Secondary | ICD-10-CM | POA: Insufficient documentation

## 2014-09-19 DIAGNOSIS — E785 Hyperlipidemia, unspecified: Secondary | ICD-10-CM | POA: Diagnosis not present

## 2014-09-19 DIAGNOSIS — Z952 Presence of prosthetic heart valve: Secondary | ICD-10-CM | POA: Insufficient documentation

## 2014-09-19 DIAGNOSIS — I350 Nonrheumatic aortic (valve) stenosis: Secondary | ICD-10-CM | POA: Diagnosis not present

## 2014-09-19 HISTORY — PX: CARDIOVERSION: SHX1299

## 2014-09-19 LAB — POCT INR: INR: 4.7

## 2014-09-19 SURGERY — CARDIOVERSION
Anesthesia: Monitor Anesthesia Care

## 2014-09-19 MED ORDER — PROPOFOL 10 MG/ML IV BOLUS
INTRAVENOUS | Status: DC | PRN
  Administered 2014-09-19: 100 mg via INTRAVENOUS
  Administered 2014-09-19: 40 mg via INTRAVENOUS

## 2014-09-19 MED ORDER — ONDANSETRON 8 MG PO TBDP: 8.0000 mg | ORAL_TABLET | Freq: Three times a day (TID) | ORAL | Status: DC | PRN

## 2014-09-19 MED ORDER — AMIODARONE HCL 200 MG PO TABS: 200.0000 mg | ORAL_TABLET | Freq: Every day | ORAL | Status: DC

## 2014-09-19 MED ORDER — LIDOCAINE HCL (CARDIAC) 20 MG/ML IV SOLN
INTRAVENOUS | Status: DC | PRN
  Administered 2014-09-19: 60 mg via INTRAVENOUS

## 2014-09-19 MED ORDER — SODIUM CHLORIDE 0.9 % IJ SOLN: 3.0000 mL | INTRAMUSCULAR | Status: DC | PRN

## 2014-09-19 MED ORDER — SODIUM CHLORIDE 0.9 % IJ SOLN: 3.0000 mL | Freq: Two times a day (BID) | INTRAMUSCULAR | Status: DC

## 2014-09-19 MED ORDER — SODIUM CHLORIDE 0.9 % IV SOLN
250.0000 mL | INTRAVENOUS | Status: DC
  Administered 2014-09-19: 500 mL via INTRAVENOUS

## 2014-09-19 NOTE — Anesthesia Preprocedure Evaluation (Addendum)
Anesthesia Evaluation  Patient identified by MRN, date of birth, ID band Patient awake    Reviewed: Allergy & Precautions, NPO status , Patient's Chart, lab work & pertinent test results  Airway Mallampati: II  TM Distance: >3 FB Neck ROM: Full    Dental  (+) Teeth Intact, Dental Advisory Given   Pulmonary shortness of breath and at rest, asthma , sleep apnea , COPD oxygen dependent, former smoker,  breath sounds clear to auscultation        Cardiovascular hypertension, Rhythm:Irregular Rate:Tachycardia     Neuro/Psych PSYCHIATRIC DISORDERS    GI/Hepatic   Endo/Other    Renal/GU Renal disease     Musculoskeletal   Abdominal   Peds  Hematology   Anesthesia Other Findings   Reproductive/Obstetrics                           Anesthesia Physical Anesthesia Plan  ASA: III  Anesthesia Plan: General   Post-op Pain Management:    Induction: Intravenous  Airway Management Planned: Mask  Additional Equipment:   Intra-op Plan:   Post-operative Plan:   Informed Consent:   Dental Advisory Given  Plan Discussed with: Anesthesiologist, Surgeon and CRNA  Anesthesia Plan Comments:        Anesthesia Quick Evaluation

## 2014-09-19 NOTE — H&P (View-Only) (Signed)
Cardiology Office Note   Date:  09/12/2014   ID:  CARRISSA NEEPER, DOB 03-20-1961, MRN [id number]  PCP:  [name], FNP  Cardiologist:  Dr. Quay Burow     Chief Complaint  Patient presents with  . Atrial Flutter     History of Present Illness: [name] is a 55 y.o. female with a hx of valvular heart disease, idiopathic pulmonary hemosiderosis, CKD, HTN, HL, OSA.  She underwent mechanical MVR and mechanical AVR with [name] at [facility] 07/2014.  Recently seen by [name], NP and noted to be in AFlutter.  Last seen 08/30/14.  Rate was uncontrolled.  Amiodarone dose was increased as well as the beta-blocker.  She is brought back for FU today with an eye towards DCCV if she remains in AFlutter.    From [facility]: "Cath and echo showed severe aortic stenosis, moderate mitral stenosis, PHBP, very small LV cavity and small aortic root with preserved ventricular function and no significant coronary disease. The patient was taken to the operating room by [name], on 08/06/2014 where she underwent Aortic valve replacement 65mm StJude Regent valve, mitral valve replacement 32mm StJude mechanical valve finding rheumatic disease."   Recent Labs  08/19/14 0944 08/26/14 0742 08/30/14 0948 09/05/14 09/12/14 1133  INR 3.1 3.9 2.2 3.1* 4.7    She is still in AFlutter.  Saw [name] recently in FU.  They adjusted her Metoprolol to 50 mg Twice daily.  She feels weak.  Also notes some nausea, skin tingling.  She feels worse since starting on the higher dose of Metoprolol.  Her chest soreness is improved. She feels like her breathing is improved.  She is now NYHA 2b.  Still wears O2 when she ambulates.  She denies syncope. She denies orthopnea, PND.  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  Studies/Reports Reviewed Today:  Echo 08/30/14 -Mild concentric hypertrophy. EF 55% to 60%. Regional wall motion abnormalities cannot be excluded. - Aortic valve: A mechanical  prosthesis was present. Mean gradient (S): 18 mm Hg. Peak gradient (S): 41 mm Hg. - Aortic root: The aortic root was mildly dilated. - Mitral valve: A mechanical prosthesis was present. Peak gradient (D): 15 mm Hg. - Left atrium: The atrium was mildly dilated. - Pericardium, extracardiac: A small pericardial effusion was identified. Impressions: - Technically difficult; Normal LV functon; s/p AVR and MVR; small  pericardial effusion.  Carotid US 4/14 bilat ICA 0-49%   Past Medical History  Diagnosis Date  . Allergic rhinitis   . Noninfectious lymphedema   . Undiagnosed cardiac murmurs   . Mitral stenosis   . Morbid obesity   . Depressive disorder   . Menopausal symptoms   . Mitral and aortic heart valve diseases, unspecified March 2016    s/p AVR with #19 St Jude and s/p MVR with 55mm St. Jude per [name] at [facility]  . Vitamin D deficiency   . HTN (hypertension)   . Hyperlipidemia   . CAO (chronic airflow obstruction)   . Gout   . Hypertriglyceridemia   . Sleep apnea   . Kidney disease     Dr. Graylon Gunning  . Anxiety   . Arthritis   . Shortness of breath     with exertion  . Aortic stenosis     Past Surgical History  Procedure Laterality Date  . Video bronchoscopy Bilateral 10/25/2012    Procedure: VIDEO BRONCHOSCOPY WITH FLUORO;  Surgeon: Kathee Delton, MD;  Location: [facility];  Service: Cardiopulmonary;  Laterality: Bilateral;  . Tonsillectomy    . Video assisted thoracoscopy Left 10/03/2013    Procedure: Left Video Assited Thoracoscopy;  Surgeon: Melrose Nakayama, MD;  Location: [facility];  Service: Thoracic;  Laterality: Left;  . Lung biopsy Left 10/03/2013    Procedure: Left Lung Biopsy;  Surgeon: Melrose Nakayama, MD;  Location: [facility];  Service: Thoracic;  Laterality: Left;     Current Outpatient Prescriptions  Medication Sig Dispense Refill  . Acetaminophen (TYLENOL ARTHRITIS PAIN PO) Take 650 mg by mouth 2 (two) times daily.     Marland Kitchen albuterol (PROVENTIL  HFA;VENTOLIN HFA) 108 (90 BASE) MCG/ACT inhaler Inhale 2 puffs into the lungs 2 (two) times daily as needed for wheezing or shortness of breath.    Marland Kitchen amiodarone (PACERONE) 200 MG tablet Take 1 tablet (200 mg total) by mouth 2 (two) times daily. 60 tablet 6  . aspirin 81 MG tablet Take 81 mg by mouth daily.    Marland Kitchen buPROPion (WELLBUTRIN XL) 300 MG 24 hr tablet Take 300 mg by mouth every morning.     . Cholecalciferol (VITAMIN D3) 2000 UNITS TABS Take 2,000 Units by mouth daily.     . citalopram (CELEXA) 20 MG tablet Take 20 mg by mouth daily.     . furosemide (LASIX) 40 MG tablet Take 40 mg by mouth daily.     Marland Kitchen KLOR-CON M20 20 MEQ tablet Take 20 mEq by mouth daily.     . metoprolol tartrate (LOPRESSOR) 25 MG tablet Take 50 mg by mouth 2 (two) times daily.    . midodrine (PROAMATINE) 10 MG tablet Take 10 mg by mouth 3 (three) times daily.    . montelukast (SINGULAIR) 10 MG tablet Take 10 mg by mouth at bedtime.    . pravastatin (PRAVACHOL) 80 MG tablet Take 80 mg by mouth daily.     Marland Kitchen senna-docusate (SENOKOT-S) 8.6-50 MG per tablet Take 1 tablet by mouth as directed. 1-2 tabs daily prn for constipation    . warfarin (COUMADIN) 7.5 MG tablet Take 7.5 mg by mouth daily.     No current facility-administered medications for this visit.    Allergies:   Review of patient's allergies indicates no known allergies.    Social History:  The patient  reports that she quit smoking about 11 months ago. Her smoking use included Cigarettes. She has a 35 pack-year smoking history. She has never used smokeless tobacco. She reports that she does not drink alcohol or use illicit drugs.   Family History:  The patient's family history includes Cancer in her mother; Emphysema in her mother; Heart disease in her father; Hypertension in her brother, father, mother, and sister; Kidney disease in her father; Stroke in her brother. There is no history of Heart attack.    ROS:   Please see the history of present illness.    Review of Systems  Gastrointestinal: Positive for nausea.  Neurological: Positive for paresthesias.  Psychiatric/Behavioral: The patient is nervous/anxious.   All other systems reviewed and are negative.    PHYSICAL EXAM: VS:  BP 110/78 mmHg  Pulse 95  Ht 5\' 6"  (1.676 m)  Wt 303 lb (137.44 kg)  BMI 48.93 kg/m2    Wt Readings from Last 3 Encounters:  09/12/14 303 lb (137.44 kg)  08/30/14 314 lb (142.429 kg)  08/26/14 316 lb (143.337 kg)     GEN: Well nourished, well developed, in no acute distress HEENT: normal Neck: no JVD, no masses Cardiac:  Mechanical S1/ Mechanical S2, irreg irreg rhythm; no murmur ,  no rubs or gallops, 2+ edema R leg Respiratory:  clear to auscultation bilaterally, no wheezing, rhonchi or rales. GI: soft, nontender, nondistended, + BS MS: no deformity or atrophy Skin: warm and dry  Neuro:  CNs II-XII intact, Strength and sensation are intact Psych: Normal affect   EKG:  EKG is ordered today.  It demonstrates:   Typical Atrial flutter, HR 95   Recent Labs: 08/26/2014: ALT 21; BUN 28*; Creatinine 1.69*; Hemoglobin 10.9*; Platelets 379.0; Potassium 4.1; Sodium 139; TSH 5.73*    Lipid Panel No results found for: CHOL, TRIG, HDL, CHOLHDL, VLDL, LDLCALC, LDLDIRECT    ASSESSMENT AND PLAN:  Atrial flutter, unspecified She remains in AFlutter.  HR is better controlled.  She is having symptoms of nausea, weakness, dizziness.  No syncope.  I suspect her symptoms are related to the Amiodarone.  I tried to arrange DCCV for tomorrow as her INR is therapeutic today.  She cannot come back tomorrow.  Will plan DCCV next week.  Will get labs the day before and repeat INR and ECG.  I would rather have her remain on her current dose of Amio for now.  We can have her decrease Amio to 200 mg QD on the day of her DCCV.  Will also cut back on Metoprolol to 25 mg bid that day as well.  If she has recurrent AFlutter, consider EP evaluation as it is typical and would likely  be amenable to RFCA.    Valvular heart disease S/P AVR and S/P MVR (mitral valve repair) Continue coumadin.  Refer to Cardiac Rehab.  Continue SBE prophylaxis.  Recent echo with stable MVR and AVR.  Essential hypertension Controlled.  She is on Midodrine due to low BP after surgery.  Will need to eventually ween her off of this.  Hyperlipidemia Continue statin.  High risk medication use - Amiodarone Will get TSH and LFTs next week with BMET and CBC.  Renal Insufficiency Repeat BMET next week as noted above.   Current medicines are reviewed at length with the patient today.  The patient has concerns regarding medicines.  The following changes have been made:  no change  Labs/ tests ordered today include:   Orders Placed This Encounter  Procedures  . Basic Metabolic Panel (BMET)  . CBC  . TSH  . Hepatic function panel  . EKG 12-Lead    Disposition:   FU with [name], NP 2 weeks.    Signed, Versie Starks, MHS 09/12/2014 12:04 PM    [facility] [address], [place], [place]  [address] Phone: [phone]; Fax: [phone]

## 2014-09-19 NOTE — Transfer of Care (Signed)
Immediate Anesthesia Transfer of Care Note  Patient: [name]  Procedure(s) Performed: Procedure(s): CARDIOVERSION (N/A)  Patient Location: Endoscopy Unit  Anesthesia Type:MAC  Level of Consciousness: awake, alert  and oriented  Airway & Oxygen Therapy: Patient Spontanous Breathing and Patient connected to nasal cannula oxygen  Post-op Assessment: Report given to RN, Post -op Vital signs reviewed and stable and Patient moving all extremities X 4  Post vital signs: Reviewed and stable  Last Vitals:  Filed Vitals:   09/19/14 1139  BP: 138/77  Temp: 36.8 C  Resp: 23    Complications: No apparent anesthesia complications

## 2014-09-19 NOTE — Discharge Instructions (Signed)

## 2014-09-19 NOTE — Interval H&P Note (Signed)
History and Physical Interval Note:  09/19/2014 12:49 PM  [name]  has presented today for surgery, with the diagnosis of AFLUTTER  The various methods of treatment have been discussed with the patient and family. After consideration of risks, benefits and other options for treatment, the patient has consented to  Procedure(s): CARDIOVERSION (N/A) as a surgical intervention .  The patient's history has been reviewed, patient examined, no change in status, stable for surgery.  I have reviewed the patient's chart and labs.  Questions were answered to the patient's satisfaction.     Nickey Kloepfer

## 2014-09-19 NOTE — Anesthesia Postprocedure Evaluation (Signed)
  Anesthesia Post-op Note  Patient: [name]  Procedure(s) Performed: Procedure(s): CARDIOVERSION (N/A)  Patient Location: Endoscopy  Anesthesia Type:General  Level of Consciousness: awake, alert  and oriented  Airway and Oxygen Therapy: Patient Spontanous Breathing and Patient connected to nasal cannula oxygen  Post-op Pain: none  Post-op Assessment: Post-op Vital signs reviewed, Patient's Cardiovascular Status Stable, Respiratory Function Stable, Patent Airway and Pain level controlled  Post-op Vital Signs: stable  Last Vitals:  Filed Vitals:   09/19/14 1340  BP: 98/54  Pulse: 65  Temp:   Resp: 20    Complications: No apparent anesthesia complications

## 2014-09-19 NOTE — Op Note (Signed)
Procedure: Electrical Cardioversion Indications:  Atrial Flutter  Procedure Details:  Consent: Risks of procedure as well as the alternatives and risks of each were explained to the (patient/caregiver).  Consent for procedure obtained.  Time Out: Verified patient identification, verified procedure, site/side was marked, verified correct patient position, special equipment/implants available, medications/allergies/relevent history reviewed, required imaging and test results available.  Performed  Patient placed on cardiac monitor, pulse oximetry, supplemental oxygen as necessary.  Sedation given: propofol 140 mg IV, Dr. Linna Caprice Pacer pads placed anterior and posterior chest.  Cardioverted 1 time(s).  Cardioverted at synchronized [id number].  Evaluation: Findings: Post procedure EKG shows: Atrial Flutter Complications: None Patient did tolerate procedure well.  Time Spent Directly with the Patient:  30 minutes   [name] 09/19/2014, 1:20 PM

## 2014-09-20 ENCOUNTER — Telehealth: Payer: Self-pay

## 2014-09-20 ENCOUNTER — Encounter (HOSPITAL_COMMUNITY): Payer: Self-pay | Admitting: Cardiovascular Disease

## 2014-09-20 NOTE — Telephone Encounter (Signed)
Called patient to schedule appointment with [name] on Monday (09/23/2014) at 11:00 am. Patient agreed to appointment.

## 2014-09-23 ENCOUNTER — Ambulatory Visit (INDEPENDENT_AMBULATORY_CARE_PROVIDER_SITE_OTHER): Payer: Medicare Other | Admitting: Pharmacist

## 2014-09-23 ENCOUNTER — Encounter: Payer: Self-pay | Admitting: Nurse Practitioner

## 2014-09-23 ENCOUNTER — Ambulatory Visit (INDEPENDENT_AMBULATORY_CARE_PROVIDER_SITE_OTHER): Payer: Medicare Other | Admitting: Nurse Practitioner

## 2014-09-23 VITALS — BP 110/70 | HR 66 | Resp 20 | Ht 66.0 in | Wt 312.0 lb

## 2014-09-23 DIAGNOSIS — I350 Nonrheumatic aortic (valve) stenosis: Secondary | ICD-10-CM | POA: Diagnosis not present

## 2014-09-23 DIAGNOSIS — I4892 Unspecified atrial flutter: Secondary | ICD-10-CM

## 2014-09-23 DIAGNOSIS — I10 Essential (primary) hypertension: Secondary | ICD-10-CM

## 2014-09-23 DIAGNOSIS — R0600 Dyspnea, unspecified: Secondary | ICD-10-CM

## 2014-09-23 DIAGNOSIS — Z954 Presence of other heart-valve replacement: Secondary | ICD-10-CM | POA: Diagnosis not present

## 2014-09-23 DIAGNOSIS — Z9889 Other specified postprocedural states: Secondary | ICD-10-CM

## 2014-09-23 DIAGNOSIS — Z79899 Other long term (current) drug therapy: Secondary | ICD-10-CM

## 2014-09-23 DIAGNOSIS — Z952 Presence of prosthetic heart valve: Secondary | ICD-10-CM

## 2014-09-23 LAB — BASIC METABOLIC PANEL
BUN: 23 mg/dL (ref 6–23)
CO2: 27 mEq/L (ref 19–32)
Calcium: 10 mg/dL (ref 8.4–10.5)
Chloride: 105 mEq/L (ref 96–112)
Creatinine, Ser: 1.21 mg/dL — ABNORMAL HIGH (ref 0.40–1.20)
GFR: 49.12 mL/min — ABNORMAL LOW (ref >60.00)
Glucose, Bld: 86 mg/dL (ref 70–99)
Potassium: 4.1 mEq/L (ref 3.5–5.1)
Sodium: 137 mEq/L (ref 135–145)

## 2014-09-23 LAB — BRAIN NATRIURETIC PEPTIDE: Pro B Natriuretic peptide (BNP): 343 pg/mL — ABNORMAL HIGH (ref 0.0–100.0)

## 2014-09-23 LAB — POCT INR: INR: 2.8

## 2014-09-23 NOTE — Patient Instructions (Addendum)
We will be checking the following labs today - BMET & BNP   Medication Instructions:    Continue with your current medicines but I am cutting back the Midodrine to just twice a day.  Resume your lasix and potassium    Testing/Procedures To Be Arranged:  N/A  Follow-Up:   We will see you back on 3 to 4 weeks  Please get follow up visit with Dr. Danton Sewer    Other Special Instructions:   I will send a note to cardiac rehab  Call the [place] office at [phone] if you have any questions, problems or concerns.

## 2014-09-23 NOTE — Op Note (Signed)
Error - ECG after cardioversion showed NSR with first degree AV block

## 2014-09-23 NOTE — Progress Notes (Addendum)
CARDIOLOGY OFFICE NOTE  Date:  09/23/2014    [name] Date of Birth: 10/22/1960 Medical Record [id number]  PCP:  [name], FNP  Cardiologist:  Dr. Gwenlyn Found    Chief Complaint  Patient presents with  . Post cardioversion for atrial flutter    Post hospital - seen for Dr. Gwenlyn Found     History of Present Illness: [name] is a 54 y.o. female who presents today for a post cardioversion visit. Seen for Dr. Gwenlyn Found but the patient has expressed desire to change cardiologists.   She has a hx of valvular heart disease, idiopathic pulmonary hemosiderosis, CKD, HTN, HL, OSA. She underwent mechanical MVR and mechanical AVR with [name] at [facility] 07/2014. Post op she was noted to be in AFlutter. Rate was uncontrolled. Amiodarone dose was increased as well as the beta-blocker. She has had tolerance issues with amiodarone. Has seen [name] back at [facility] - had her beta blocker increased.   From [facility]: "Cath and echo showed severe aortic stenosis, moderate mitral stenosis, PHBP, very small LV cavity and small aortic root with preserved ventricular function and no significant coronary disease. The patient was taken to the operating room by [name], on 08/06/2014 where she underwent Aortic valve replacement 30mm StJude Regent valve, mitral valve replacement 34mm StJude mechanical valve finding rheumatic disease."        She underwent cardioversion on 4/21 - the dictated note says the post EKG was atrial flutter. The EKG post cardioversion showed NSR.     She was called and asked to come here today to verify her rhythm before referral to Dr. Rayann Heman for ablation. She has been holding her Lasix and potassium due to worsening renal function. She does see [facility] - Dr. Posey Pronto.  Comes back today. Here alone. She is feeling better. Feels stronger. Breathing ok - using her oxygen only as needed. Some chest soreness. Has been released by [name]. No  palpitations. She is tolerating her current medical regimen. No way of checking BP at home. Would like to go to cardiac rehab. She is constipated.   Past Medical History  Diagnosis Date  . Allergic rhinitis   . Noninfectious lymphedema   . Undiagnosed cardiac murmurs   . Mitral stenosis   . Morbid obesity   . Depressive disorder   . Menopausal symptoms   . Mitral and aortic heart valve diseases, unspecified March 2016    s/p AVR with #19 St Jude and s/p MVR with 39mm St. Jude per [name] at [facility]  . Vitamin D deficiency   . HTN (hypertension)   . Hyperlipidemia   . CAO (chronic airflow obstruction)   . Gout   . Hypertriglyceridemia   . Sleep apnea   . Kidney disease     Dr. Graylon Gunning  . Anxiety   . Arthritis   . Shortness of breath     with exertion  . Aortic stenosis     Past Surgical History  Procedure Laterality Date  . Video bronchoscopy Bilateral 10/25/2012    Procedure: VIDEO BRONCHOSCOPY WITH FLUORO;  Surgeon: Kathee Delton, MD;  Location: [facility];  Service: Cardiopulmonary;  Laterality: Bilateral;  . Tonsillectomy    . Video assisted thoracoscopy Left 10/03/2013    Procedure: Left Video Assited Thoracoscopy;  Surgeon: Melrose Nakayama, MD;  Location: [place];  Service: Thoracic;  Laterality: Left;  . Lung biopsy Left 10/03/2013    Procedure: Left Lung Biopsy;  Surgeon:  Melrose Nakayama, MD;  Location: [place];  Service: Thoracic;  Laterality: Left;  . Cardioversion N/A 09/19/2014    Procedure: CARDIOVERSION;  Surgeon: Sanda Klein, MD;  [facility];  Service: Cardiovascular;  Laterality: N/A;     Medications: Current Outpatient Prescriptions  Medication Sig Dispense Refill  . acetaminophen (TYLENOL) 650 MG CR tablet Take 1,950 mg by mouth 2 (two) times daily as needed for pain.    Marland Kitchen albuterol (PROVENTIL HFA;VENTOLIN HFA) 108 (90 BASE) MCG/ACT inhaler Inhale 2 puffs into the lungs 2 (two) times daily as needed for wheezing or shortness of breath.     Marland Kitchen aspirin EC 81 MG tablet Take 81 mg by mouth daily.    Marland Kitchen buPROPion (WELLBUTRIN XL) 300 MG 24 hr tablet Take 300 mg by mouth daily.     . Cholecalciferol (VITAMIN D3) 2000 UNITS TABS Take 2,000 Units by mouth daily.     . citalopram (CELEXA) 20 MG tablet Take 20 mg by mouth daily.     . metoprolol tartrate (LOPRESSOR) 25 MG tablet Take 25 mg by mouth 2 (two) times daily.     . midodrine (PROAMATINE) 10 MG tablet Take 10 mg by mouth 2 (two) times daily.    . montelukast (SINGULAIR) 10 MG tablet Take 10 mg by mouth daily.     . ondansetron (ZOFRAN-ODT) 8 MG disintegrating tablet Take 1 tablet (8 mg total) by mouth every 8 (eight) hours as needed for nausea or vomiting. 20 tablet 0  . pravastatin (PRAVACHOL) 80 MG tablet Take 80 mg by mouth daily.     Marland Kitchen senna-docusate (SENOKOT-S) 8.6-50 MG per tablet Take 2 tablets by mouth daily as needed (constipation). 1-2 tabs daily prn for constipation    . warfarin (COUMADIN) 7.5 MG tablet Take 7.5 mg by mouth at bedtime.     . furosemide (LASIX) 40 MG tablet Take 40 mg by mouth daily.     . potassium chloride SA (K-DUR,KLOR-CON) 20 MEQ tablet Take 20 mEq by mouth daily.     No current facility-administered medications for this visit.    Allergies: No Known Allergies  Social History: The patient  reports that she quit smoking about a year ago. Her smoking use included Cigarettes. She has a 35 pack-year smoking history. She has never used smokeless tobacco. She reports that she does not drink alcohol or use illicit drugs.   Family History: The patient's family history includes Cancer in her mother; Emphysema in her mother; Heart disease in her father; Hypertension in her brother, father, mother, and sister; Kidney disease in her father; Stroke in her brother. There is no history of Heart attack.   Review of Systems: Please see the history of present illness.   Otherwise, the review of systems is positive for constipation.   All other systems are  reviewed and negative.   Physical Exam: VS:  BP 110/70 mmHg  Pulse 66  Resp 20  Ht 5\' 6"  (1.676 m)  Wt 312 lb (141.522 kg)  BMI 50.38 kg/m2  SpO2 96% .  BMI Body mass index is 50.38 kg/(m^2).  Wt Readings from Last 3 Encounters:  09/23/14 312 lb (141.522 kg)  09/12/14 303 lb (137.44 kg)  08/30/14 314 lb (142.429 kg)    General: Pleasant. Well developed, well nourished and in no acute distress.  HEENT: Normal. Neck: Supple, no JVD, carotid bruits, or masses noted.  Cardiac: Regular rate and rhythm. Soft outflow murmur.  No edema.  Respiratory:  Lungs are clear  to auscultation bilaterally with normal work of breathing.  GI: Soft and nontender.  MS: No deformity or atrophy. Gait and ROM intact. Skin: Warm and dry. Color is normal.  Neuro:  Strength and sensation are intact and no gross focal deficits noted.  Psych: Alert, appropriate and with normal affect.   LABORATORY DATA:  EKG:  EKG is ordered today. This demonstrates NSR with 1st degree AV block.  Lab Results  Component Value Date   WBC 10.8* 09/18/2014   HGB 10.6* 09/18/2014   HCT 33.0* 09/18/2014   PLT 394.0 09/18/2014   GLUCOSE 111* 09/18/2014   ALT 18 09/18/2014   AST 20 09/18/2014   NA 135 09/18/2014   K 4.9 09/18/2014   CL 100 09/18/2014   CREATININE 2.28* 09/18/2014   BUN 42* 09/18/2014   CO2 28 09/18/2014   TSH 3.57 09/18/2014   INR 4.7 09/19/2014    BNP (last 3 results) No results for input(s): BNP in the last 8760 hours.  ProBNP (last 3 results) No results for input(s): PROBNP in the last 8760 hours.   Other Studies Reviewed Today:   Cardioversion Procedure Details:  Consent: Risks of procedure as well as the alternatives and risks of each were explained to the (patient/caregiver). Consent for procedure obtained.  Time Out: Verified patient identification, verified procedure, site/side was marked, verified correct patient position, special equipment/implants available,  medications/allergies/relevent history reviewed, required imaging and test results available. Performed  Patient placed on cardiac monitor, pulse oximetry, supplemental oxygen as necessary.  Sedation given: propofol 140 mg IV, Dr. Linna Caprice Pacer pads placed anterior and posterior chest.  Cardioverted 1 time(s).  Cardioverted at synchronized biphasic120J.  Evaluation: Findings: Post procedure EKG shows: Atrial Flutter Complications: None Patient did tolerate procedure well.  Time Spent Directly with the Patient:  30 minutes  CROITORU,MIHAI 09/19/2014, 1:20 PM    Echo Study Conclusions from 08/2014  - Left ventricle: The cavity size was normal. There was mild concentric hypertrophy. Systolic function was normal. The estimated ejection fraction was in the range of 55% to 60%. Regional wall motion abnormalities cannot be excluded. - Aortic valve: A mechanical prosthesis was present. - Aortic root: The aortic root was mildly dilated. - Mitral valve: A mechanical prosthesis was present. - Left atrium: The atrium was mildly dilated. - Pericardium, extracardiac: A small pericardial effusion was identified.  Impressions:  - Technically difficult; Normal LV functon; s/p AVR and MVR; small pericardial effusion   Assessment/Plan:  Atrial flutter, unspecified She is in NSR. Post cardioversion EKG was NSR. I have left her on her current regimen. Will leave her on her current dose of amiodarone for 6 to 8 weeks. Hopefully this was all related to her surgery.   Valvular heart disease S/P AVR and S/P MVR (mitral valve repair) Continue coumadin. Refer to Cardiac Rehab. Continue SBE prophylaxis. Recent echo with stable MVR and AVR. I do worry about the size of her prosthetic aortic valve. Will need regular echo's.  Essential hypertension Controlled. She is on Midodrine due to low BP after surgery. I am cutting this back to just BID and hope to stop on  return  Hyperlipidemia Continue statin.  High risk medication use - Amiodarone  Renal Insufficiency Repeat BMET today.  Chronic shortness of breath  I am leaving her on her oxygen. Restarting Lasix/potassium today. I would like for her to touch base with Dr. Gwenette Greet.   She prefers to follow with me going forward. I would like a cardiologist to be listed  if needed - will see if Dr. Acie Fredrickson or [name] will accept.    Current medicines are reviewed with the patient today.  The patient does not have concerns regarding medicines other than what has been noted above.  The following changes have been made:  See above.  Labs/ tests ordered today include:    Orders Placed This Encounter  Procedures  . Brain natriuretic peptide  . Basic metabolic panel  . EKG 12-Lead     Disposition:   FU with me in 3 to 4 weeks.   Patient is agreeable to this plan and will call if any problems develop in the interim.   Signed: [name], RN, ANP-C 09/23/2014 11:39 AM  [facility] [address] [place], [place]  [address] Phone: [phone] Fax: [phone]        Addendum 09/24/2014 I have spoken to [name] here in the office - he has agreed to be the primary cardiologist for [place].   [name], RN, [facility] [address] [place], [place]  [address] [phone]

## 2014-09-25 DIAGNOSIS — J8403 Idiopathic pulmonary hemosiderosis: Secondary | ICD-10-CM | POA: Diagnosis not present

## 2014-09-25 DIAGNOSIS — J849 Interstitial pulmonary disease, unspecified: Secondary | ICD-10-CM | POA: Diagnosis not present

## 2014-09-30 ENCOUNTER — Ambulatory Visit (INDEPENDENT_AMBULATORY_CARE_PROVIDER_SITE_OTHER): Payer: Medicare Other

## 2014-09-30 DIAGNOSIS — I350 Nonrheumatic aortic (valve) stenosis: Secondary | ICD-10-CM

## 2014-09-30 LAB — POCT INR: INR: 2.8

## 2014-10-10 ENCOUNTER — Telehealth: Payer: Self-pay | Admitting: *Deleted

## 2014-10-10 NOTE — Telephone Encounter (Signed)
Faxed over medical necessity paperwork for oxygen.  [name], NP, will not sign for this did not order.

## 2014-10-14 ENCOUNTER — Ambulatory Visit (INDEPENDENT_AMBULATORY_CARE_PROVIDER_SITE_OTHER): Payer: Medicare Other | Admitting: *Deleted

## 2014-10-14 ENCOUNTER — Ambulatory Visit (INDEPENDENT_AMBULATORY_CARE_PROVIDER_SITE_OTHER): Payer: Medicare Other | Admitting: Pulmonary Disease

## 2014-10-14 ENCOUNTER — Encounter: Payer: Self-pay | Admitting: Pulmonary Disease

## 2014-10-14 VITALS — BP 122/72 | HR 67 | Temp 98.1°F | Ht 66.0 in | Wt 293.0 lb

## 2014-10-14 DIAGNOSIS — I350 Nonrheumatic aortic (valve) stenosis: Secondary | ICD-10-CM | POA: Diagnosis not present

## 2014-10-14 DIAGNOSIS — R0489 Hemorrhage from other sites in respiratory passages: Secondary | ICD-10-CM

## 2014-10-14 DIAGNOSIS — R042 Hemoptysis: Secondary | ICD-10-CM

## 2014-10-14 LAB — POCT INR: INR: 3.4

## 2014-10-14 NOTE — Progress Notes (Signed)
   Subjective:    Patient ID: [name], female    DOB: [birth year], 54 y.o.   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 I was also concerned about fracture capillary syndrome associated with left-sided pressure overload. The patient was sent to [facility] for a second opinion, and underwent a catheterization which showed her valvular heart disease was worse than estimated by echo. She ultimately underwent an aortic and mitral valve replacement, and is following up. She has done much better since her surgery, and feels that her breathing is steadily improving. She is staying off cigarettes, and has no significant cough or mucus.   Review of Systems  Constitutional: Negative for fever and unexpected weight change.  HENT: Negative for congestion, dental problem, ear pain, nosebleeds, postnasal drip, rhinorrhea, sinus pressure, sneezing, sore throat and trouble swallowing.   Eyes: Negative for redness and itching.  Respiratory: Negative for cough, chest tightness, shortness of breath and wheezing.   Cardiovascular: Negative for palpitations and leg swelling.  Gastrointestinal: Negative for nausea and vomiting.  Genitourinary: Negative for dysuria.  Musculoskeletal: Negative for joint swelling.  Skin: Negative for rash.  Neurological: Negative for headaches.  Hematological: Bruises/bleeds easily.  Psychiatric/Behavioral: Negative for dysphoric mood. The patient is not nervous/anxious.        Objective:   Physical Exam Obese female in no acute distress Nose without purulence or discharge noted Neck without lymphadenopathy or thyromegaly Chest totally clear to auscultation, no crackles or wheezes Cardiac exam with regular rate and rhythm Lower extremities with severe lymphedema on the right, normal on the left Alert and oriented, moves all 4 extremities.       Assessment & Plan:

## 2014-10-14 NOTE — Assessment & Plan Note (Signed)
The patient is doing much better since her aortic and mitral valve replacements, and has been working on weight loss and conditioning. She is staying away from cigarettes, and denies any significant cough or mucus production. 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 She is doing much better after her valve replacement. At this point, I would like to give her a little more time for recovery, and then do a follow-up chest x-ray. If her x-ray is fairly clear, and she is doing well from a pulmonary standpoint, she will not need further follow-up. If her x-ray remains abnormal, she may need a follow-up CT for evaluation.

## 2014-10-14 NOTE — Patient Instructions (Signed)
Continue to work on weight loss and conditioning program followup in 4 mos with Dr. Lake [name], with chest xray on same day

## 2014-10-23 ENCOUNTER — Encounter: Payer: Self-pay | Admitting: Nurse Practitioner

## 2014-10-23 ENCOUNTER — Ambulatory Visit (INDEPENDENT_AMBULATORY_CARE_PROVIDER_SITE_OTHER): Payer: Medicare Other | Admitting: Nurse Practitioner

## 2014-10-23 VITALS — BP 122/70 | HR 67 | Ht 66.5 in | Wt 292.1 lb

## 2014-10-23 DIAGNOSIS — I38 Endocarditis, valve unspecified: Secondary | ICD-10-CM

## 2014-10-23 DIAGNOSIS — Z954 Presence of other heart-valve replacement: Secondary | ICD-10-CM

## 2014-10-23 DIAGNOSIS — Z952 Presence of prosthetic heart valve: Secondary | ICD-10-CM

## 2014-10-23 DIAGNOSIS — I4892 Unspecified atrial flutter: Secondary | ICD-10-CM

## 2014-10-23 LAB — BASIC METABOLIC PANEL
BUN: 23 mg/dL (ref 6–23)
CO2: 28 mEq/L (ref 19–32)
Calcium: 10 mg/dL (ref 8.4–10.5)
Chloride: 102 mEq/L (ref 96–112)
Creatinine, Ser: 1.44 mg/dL — ABNORMAL HIGH (ref 0.40–1.20)
GFR: 40.17 mL/min — ABNORMAL LOW (ref >60.00)
Glucose, Bld: 89 mg/dL (ref 70–99)
Potassium: 4.7 mEq/L (ref 3.5–5.1)
Sodium: 136 mEq/L (ref 135–145)

## 2014-10-23 NOTE — Patient Instructions (Signed)
We will be checking the following labs today - BMET   Medication Instructions:    Continue with your current medicines but I am   Cut the Midodrine back to just one pill a day for one week and then stop altogether    Testing/Procedures To Be Arranged:  N/A  Follow-Up:   I will see you back in 2 months with EKG and labs    Other Special Instructions:   N/A  Call the [place] office at [phone] if you have any questions, problems or concerns.

## 2014-10-23 NOTE — Progress Notes (Signed)
CARDIOLOGY OFFICE NOTE  Date:  10/23/2014    [name] Date of Birth: 12/03/1960 Medical Record [id number]  PCP:  [name], FNP  Cardiologist:  [name]    Chief Complaint  Patient presents with  . FU for valvular heart disease - prior double valve surgery    Follow up visit - seen for [name]    History of Present Illness: [name] is a 54 y.o. female who presents today for a follow up visit. To now establish with [name] and will be followed by me.       She underwent cardioversion on 4/21 - the dictated note says the post EKG was atrial flutter. The EKG post cardioversion showed NSR. When I saw her back for her post hospital visit - she was in sinus rhythm and was doing well.         Comes back today. Here alone. She is doing very well. Weight is down. Feeling "more like her normal self". Breathing has improved. Has seen Dr. Gwenette Greet. Not lightheaded or dizzy. No palpitations. Tolerating her medicines. No chest pain. Has not gone to cardiac rehab - trying to help her daughter out with babysitting.   Past Medical History  Diagnosis Date  . Allergic rhinitis   . Noninfectious lymphedema   . Undiagnosed cardiac murmurs   . Mitral stenosis   . Morbid obesity   . Depressive disorder   . Menopausal symptoms   . Mitral and aortic heart valve diseases, unspecified March 2016    s/p AVR with #19 St Jude and s/p MVR with 63mm St. Jude per [name] at [facility]  . Vitamin D deficiency   . HTN (hypertension)   . Hyperlipidemia   . CAO (chronic airflow obstruction)   . Gout   . Hypertriglyceridemia   . Sleep apnea   . Kidney disease     Dr. Graylon Gunning  . Anxiety   . Arthritis   . Shortness of breath     with exertion  . Aortic stenosis     Past Surgical History  Procedure Laterality Date  . Video bronchoscopy Bilateral 10/25/2012    Procedure: VIDEO BRONCHOSCOPY WITH FLUORO;  Surgeon: Kathee Delton, MD;  [facility]: [facility];   Service: Cardiopulmonary;  Laterality: Bilateral;  . Tonsillectomy    . Video assisted thoracoscopy Left 10/03/2013    Procedure: Left Video Assited Thoracoscopy;  Surgeon: Melrose Nakayama, MD;  Location: [place];  Service: Thoracic;  Laterality: Left;  . Lung biopsy Left 10/03/2013    Procedure: Left Lung Biopsy;  Surgeon: Melrose Nakayama, MD;  Location: [place];  Service: Thoracic;  Laterality: Left;  . Cardioversion N/A 09/19/2014    Procedure: CARDIOVERSION;  Surgeon: Sanda Klein, MD;  [facility];  Service: Cardiovascular;  Laterality: N/A;     Medications: Current Outpatient Prescriptions  Medication Sig Dispense Refill  . acetaminophen (TYLENOL) 650 MG CR tablet Take 1,950 mg by mouth 2 (two) times daily as needed for pain.    Marland Kitchen albuterol (PROVENTIL HFA;VENTOLIN HFA) 108 (90 BASE) MCG/ACT inhaler Inhale 2 puffs into the lungs 2 (two) times daily as needed for wheezing or shortness of breath.    Marland Kitchen amiodarone (PACERONE) 100 MG tablet Take 100 mg by mouth 2 (two) times daily.    Marland Kitchen aspirin EC 81 MG tablet Take 81 mg by mouth daily.    Marland Kitchen buPROPion (WELLBUTRIN XL) 300 MG 24 hr tablet Take 300 mg  by mouth daily.     . Cholecalciferol (VITAMIN D3) 2000 UNITS TABS Take 2,000 Units by mouth daily.     . citalopram (CELEXA) 20 MG tablet Take 20 mg by mouth daily.     . furosemide (LASIX) 40 MG tablet Take 40 mg by mouth daily.     . metoprolol tartrate (LOPRESSOR) 25 MG tablet Take 25 mg by mouth 2 (two) times daily.     . montelukast (SINGULAIR) 10 MG tablet Take 10 mg by mouth daily.     . NON FORMULARY Place 2.5 L into the nose at bedtime.    . potassium chloride SA (K-DUR,KLOR-CON) 20 MEQ tablet Take 20 mEq by mouth daily.    . pravastatin (PRAVACHOL) 80 MG tablet Take 80 mg by mouth daily.     Marland Kitchen senna-docusate (SENOKOT-S) 8.6-50 MG per tablet Take 2 tablets by mouth daily as needed (constipation). 1-2 tabs daily prn for constipation    . warfarin (COUMADIN) 7.5 MG tablet  Take 7.5 mg by mouth at bedtime.      No current facility-administered medications for this visit.    Allergies: No Known Allergies  Social History: The patient  reports that she quit smoking about 12 months ago. Her smoking use included Cigarettes. She has a 35 pack-year smoking history. She has never used smokeless tobacco. She reports that she does not drink alcohol or use illicit drugs.   Family History: The patient's family history includes Cancer in her mother; Emphysema in her mother; Heart disease in her father; Hypertension in her brother, father, mother, and sister; Kidney disease in her father; Stroke in her brother. There is no history of Heart attack.   Review of Systems: Please see the history of present illness.   Otherwise, the review of systems is positive for none.   All other systems are reviewed and negative.   Physical Exam: VS:  BP 122/70 mmHg  Pulse 67  Ht 5' 6.5" (1.689 m)  Wt 292 lb 1.9 oz (132.505 kg)  BMI 46.45 kg/m2  SpO2 90% .  BMI Body mass index is 46.45 kg/(m^2).  Wt Readings from Last 3 Encounters:  10/23/14 292 lb 1.9 oz (132.505 kg)  10/14/14 293 lb (132.904 kg)  09/23/14 312 lb (141.522 kg)    General: Pleasant. She is obese but losing weight. Well developed, well nourished and in no acute distress.  HEENT: Normal. Neck: Supple, no JVD, carotid bruits, or masses noted.  Cardiac: Regular rate and rhythm. Valves sounds crisp. No swelling noted.  Respiratory:  Lungs are clear to auscultation bilaterally with normal work of breathing.  GI: Soft and nontender.  MS: No deformity or atrophy. Gait and ROM intact. Skin: Warm and dry. Color is normal.  Neuro:  Strength and sensation are intact and no gross focal deficits noted.  Psych: Alert, appropriate and with normal affect.   LABORATORY DATA:  EKG:  EKG is not ordered today.  Lab Results  Component Value Date   WBC 10.8* 09/18/2014   HGB 10.6* 09/18/2014   HCT 33.0* 09/18/2014   PLT  394.0 09/18/2014   GLUCOSE 86 09/23/2014   ALT 18 09/18/2014   AST 20 09/18/2014   NA 137 09/23/2014   K 4.1 09/23/2014   CL 105 09/23/2014   CREATININE 1.21* 09/23/2014   BUN 23 09/23/2014   CO2 27 09/23/2014   TSH 3.57 09/18/2014   INR 3.4 10/14/2014    BNP (last 3 results) No results for input(s): BNP in the  last 8760 hours.  ProBNP (last 3 results)  Recent Labs  09/23/14 1149  PROBNP 343.0*     Other Studies Reviewed Today: Cardioversion Procedure Details:  Consent: Risks of procedure as well as the alternatives and risks of each were explained to the (patient/caregiver). Consent for procedure obtained.  Time Out: Verified patient identification, verified procedure, site/side was marked, verified correct patient position, special equipment/implants available, medications/allergies/relevent history reviewed, required imaging and test results available. Performed  Patient placed on cardiac monitor, pulse oximetry, supplemental oxygen as necessary.  Sedation given: propofol 140 mg IV, Dr. Linna Caprice Pacer pads placed anterior and posterior chest.  Cardioverted 1 time(s).  Cardioverted at synchronized biphasic120J.  Evaluation: Findings: Post procedure EKG shows: Atrial Flutter (BUT WAS IN NSR) Complications: None Patient did tolerate procedure well.  Time Spent Directly with the Patient:  30 minutes  CROITORU,MIHAI 09/19/2014, 1:20 PM    Echo Study Conclusions from 08/2014  - Left ventricle: The cavity size was normal. There was mild concentric hypertrophy. Systolic function was normal. The estimated ejection fraction was in the range of 55% to 60%. Regional wall motion abnormalities cannot be excluded. - Aortic valve: A mechanical prosthesis was present. - Aortic root: The aortic root was mildly dilated. - Mitral valve: A mechanical prosthesis was present. - Left atrium: The atrium was mildly dilated. - Pericardium, extracardiac: A small  pericardial effusion was identified.  Impressions:  - Technically difficult; Normal LV functon; s/p AVR and MVR; small pericardial effusion   Assessment/Plan:  Atrial flutter, unspecified She is in NSR. I have left her on her current regimen. Will leave her on her current dose of amiodarone until I see back and will then start to wean. Hopefully this was all related to her surgery.   Valvular heart disease S/P AVR and S/P MVR (mitral valve repair) Continue coumadin. Continue SBE prophylaxis. Recent echo with stable MVR and AVR. I do worry about the size of her prosthetic aortic valve. Will need regular echo's. She is currently doing very well.   Essential hypertension Controlled. Midodrine cut to just one a day for one week and then will stop.  Hyperlipidemia Continue statin.  High risk medication use - Amiodarone  Renal Insufficiency Repeat BMET today.  Chronic shortness of breath  Followed by pulmonary   Current medicines are reviewed with the patient today.  The patient does not have concerns regarding medicines other than what has been noted above.  The following changes have been made:  See above.  Labs/ tests ordered today include:    Orders Placed This Encounter  Procedures  . Basic metabolic panel     Disposition:   FU with me in 8 weeks with EKG  Patient is agreeable to this plan and will call if any problems develop in the interim.   Signed: [name], RN, ANP-C 10/23/2014 9:52 AM  [facility] [address] [place], [place]  [address] Phone: [phone] Fax: [phone]

## 2014-11-04 ENCOUNTER — Ambulatory Visit (INDEPENDENT_AMBULATORY_CARE_PROVIDER_SITE_OTHER): Payer: Medicare Other

## 2014-11-04 DIAGNOSIS — I350 Nonrheumatic aortic (valve) stenosis: Secondary | ICD-10-CM

## 2014-11-04 LAB — POCT INR: INR: 3.4

## 2014-11-12 ENCOUNTER — Telehealth: Payer: Self-pay | Admitting: Nurse Practitioner

## 2014-11-12 NOTE — Telephone Encounter (Signed)
New message      Patient need telephone number and contact person at [facility]

## 2014-11-13 NOTE — Telephone Encounter (Signed)
S/w pt cardiac rehab has called pt, pt lost number and needed to make arrangements before starting. Pt was give a name and phone number today to Cardiac Rehab.

## 2014-11-20 DIAGNOSIS — N183 Chronic kidney disease, stage 3 (moderate): Secondary | ICD-10-CM | POA: Diagnosis not present

## 2014-11-20 DIAGNOSIS — N189 Chronic kidney disease, unspecified: Secondary | ICD-10-CM | POA: Diagnosis not present

## 2014-11-20 DIAGNOSIS — N2581 Secondary hyperparathyroidism of renal origin: Secondary | ICD-10-CM | POA: Diagnosis not present

## 2014-11-25 DIAGNOSIS — I129 Hypertensive chronic kidney disease with stage 1 through stage 4 chronic kidney disease, or unspecified chronic kidney disease: Secondary | ICD-10-CM | POA: Diagnosis not present

## 2014-11-25 DIAGNOSIS — D631 Anemia in chronic kidney disease: Secondary | ICD-10-CM | POA: Diagnosis not present

## 2014-11-25 DIAGNOSIS — N183 Chronic kidney disease, stage 3 (moderate): Secondary | ICD-10-CM | POA: Diagnosis not present

## 2014-11-25 DIAGNOSIS — N2581 Secondary hyperparathyroidism of renal origin: Secondary | ICD-10-CM | POA: Diagnosis not present

## 2014-11-26 ENCOUNTER — Telehealth (HOSPITAL_COMMUNITY): Payer: Self-pay | Admitting: Cardiac Rehabilitation

## 2014-11-26 NOTE — Telephone Encounter (Signed)
pc to discuss enrolling in cardiac rehab.  Pt questions answered. Pt does prefer [place] location due to childcare availability.  Awaiting MD order from [name] prior to enrollment.  Pt verbalized understanding

## 2014-11-27 ENCOUNTER — Telehealth: Payer: Self-pay

## 2014-11-27 MED ORDER — WARFARIN SODIUM 7.5 MG PO TABS: ORAL_TABLET | ORAL | Status: DC

## 2014-11-27 NOTE — Telephone Encounter (Signed)
Warfarin refill sent to pharmacy as requested. 

## 2014-11-28 DIAGNOSIS — I080 Rheumatic disorders of both mitral and aortic valves: Secondary | ICD-10-CM | POA: Diagnosis not present

## 2014-11-28 DIAGNOSIS — I10 Essential (primary) hypertension: Secondary | ICD-10-CM | POA: Diagnosis not present

## 2014-11-28 DIAGNOSIS — E785 Hyperlipidemia, unspecified: Secondary | ICD-10-CM | POA: Diagnosis not present

## 2014-11-28 DIAGNOSIS — N183 Chronic kidney disease, stage 3 (moderate): Secondary | ICD-10-CM | POA: Diagnosis not present

## 2014-11-28 DIAGNOSIS — J449 Chronic obstructive pulmonary disease, unspecified: Secondary | ICD-10-CM | POA: Diagnosis not present

## 2014-11-28 DIAGNOSIS — Z79899 Other long term (current) drug therapy: Secondary | ICD-10-CM | POA: Diagnosis not present

## 2014-11-28 DIAGNOSIS — R7301 Impaired fasting glucose: Secondary | ICD-10-CM | POA: Diagnosis not present

## 2014-12-04 ENCOUNTER — Ambulatory Visit (INDEPENDENT_AMBULATORY_CARE_PROVIDER_SITE_OTHER): Payer: Medicare Other | Admitting: *Deleted

## 2014-12-04 DIAGNOSIS — I350 Nonrheumatic aortic (valve) stenosis: Secondary | ICD-10-CM

## 2014-12-04 LAB — POCT INR: INR: 2.3

## 2014-12-06 ENCOUNTER — Other Ambulatory Visit: Payer: Self-pay | Admitting: Cardiovascular Disease

## 2014-12-06 NOTE — Telephone Encounter (Signed)
REFILL 

## 2014-12-10 ENCOUNTER — Ambulatory Visit (INDEPENDENT_AMBULATORY_CARE_PROVIDER_SITE_OTHER): Payer: Medicare Other | Admitting: Nurse Practitioner

## 2014-12-10 ENCOUNTER — Encounter: Payer: Self-pay | Admitting: Nurse Practitioner

## 2014-12-10 VITALS — BP 122/70 | HR 68 | Ht 66.5 in | Wt 291.1 lb

## 2014-12-10 DIAGNOSIS — E785 Hyperlipidemia, unspecified: Secondary | ICD-10-CM

## 2014-12-10 DIAGNOSIS — I38 Endocarditis, valve unspecified: Secondary | ICD-10-CM

## 2014-12-10 DIAGNOSIS — Z954 Presence of other heart-valve replacement: Secondary | ICD-10-CM

## 2014-12-10 DIAGNOSIS — Z952 Presence of prosthetic heart valve: Secondary | ICD-10-CM

## 2014-12-10 DIAGNOSIS — Z79899 Other long term (current) drug therapy: Secondary | ICD-10-CM | POA: Diagnosis not present

## 2014-12-10 DIAGNOSIS — I4892 Unspecified atrial flutter: Secondary | ICD-10-CM

## 2014-12-10 LAB — HEPATIC FUNCTION PANEL
ALT: 17 U/L (ref 0–35)
AST: 22 U/L (ref 0–37)
Albumin: 4.3 g/dL (ref 3.5–5.2)
Alkaline Phosphatase: 71 U/L (ref 39–117)
Bilirubin, Direct: 0.1 mg/dL (ref 0.0–0.3)
Total Bilirubin: 0.4 mg/dL (ref 0.2–1.2)
Total Protein: 8 g/dL (ref 6.0–8.3)

## 2014-12-10 LAB — LIPID PANEL
Cholesterol: 158 mg/dL (ref 0–200)
HDL: 49.3 mg/dL (ref >39.00)
LDL Cholesterol: 82 mg/dL (ref 0–99)
NonHDL: 108.7
Total CHOL/HDL Ratio: 3
Triglycerides: 132 mg/dL (ref 0.0–149.0)
VLDL: 26.4 mg/dL (ref 0.0–40.0)

## 2014-12-10 NOTE — Patient Instructions (Addendum)
We will be checking the following labs today - HPF and Lipids   Medication Instructions:    Continue with your current medicines but  I am stopping the amiodarone    Testing/Procedures To Be Arranged:  N/A  Follow-Up:   See me in 4 months    Other Special Instructions:   N/A  Call the [place] office at [phone] if you have any questions, problems or concerns.

## 2014-12-10 NOTE — Progress Notes (Signed)
CARDIOLOGY OFFICE NOTE  Date:  12/10/2014    [name] Date of Birth: Feb 17, 1961 Medical Record [id number]  PCP:  [name], FNP  Cardiologist:  Tamala Julian    Chief Complaint  Patient presents with  . FU for valvular heart disease    Seen for [name]    History of Present Illness: [name] is a 54 y.o. female who presents today for a follow up visit. Seen for [name]. She is followed by me.    She has a hx of valvular heart disease, idiopathic pulmonary hemosiderosis, CKD, HTN, HL, OSA. She underwent mechanical MVR and mechanical AVR with [name] at [facility] 07/2014. Post op she was noted to be in AFlutter. Rate was uncontrolled. Amiodarone dose was increased as well as the beta-blocker. She has had tolerance issues with amiodarone. Has seen [name] back at [facility] - had her beta blocker increased.   From [facility]: "Cath and echo showed severe aortic stenosis, moderate mitral stenosis, PHBP, very small LV cavity and small aortic root with preserved ventricular function and no significant coronary disease. The patient was taken to the operating room by [name], on 08/06/2014 where she underwent Aortic valve replacement 47mm StJude Regent valve, mitral valve replacement 73mm StJude mechanical valve finding rheumatic disease."        She underwent cardioversion on 4/21 - the dictated note says the post EKG was atrial flutter however the EKG post cardioversion showed NSR. We brought her back for an early post cardioversion visit to recheck/verify her EKG - she was in NSR.  Last seen by meabout 6 weeks ago. Was doing well. Had been released from [name]. I stopped her Midodrine. She remained in NSR. Hoping to discontinue her amiodarone on future follow up.        Comes back today. Here alone. Doing well. No real complaint. Seeing Dr. Lake [name] in September. Has had a good summer so far. Breathing is good. No chest pain. Rhythm has been good.  She is very happy with how she is doing. She has had labs by PCP in June. Reports A1C was 5. Seeing nephrology later this month and has had labs. Has not had her lipids checked. Weaning her oxygen with no issue.    Past Medical History  Diagnosis Date  . Allergic rhinitis   . Noninfectious lymphedema   . Undiagnosed cardiac murmurs   . Mitral stenosis   . Morbid obesity   . Depressive disorder   . Menopausal symptoms   . Mitral and aortic heart valve diseases, unspecified March 2016    s/p AVR with #19 St Jude and s/p MVR with 20mm St. Jude per [name] at [facility]  . Vitamin D deficiency   . HTN (hypertension)   . Hyperlipidemia   . CAO (chronic airflow obstruction)   . Gout   . Hypertriglyceridemia   . Sleep apnea   . Kidney disease     Dr. Graylon Gunning  . Anxiety   . Arthritis   . Shortness of breath     with exertion  . Aortic stenosis     Past Surgical History  Procedure Laterality Date  . Video bronchoscopy Bilateral 10/25/2012    Procedure: VIDEO BRONCHOSCOPY WITH FLUORO;  Surgeon: Kathee Delton, MD;  Location: [facility];  Service: Cardiopulmonary;  Laterality: Bilateral;  . Tonsillectomy    . Video assisted thoracoscopy Left 10/03/2013    Procedure: Left Video Assited Thoracoscopy;  Surgeon: Revonda Standard  Roxan Hockey, MD;  Location: [place];  Service: Thoracic;  Laterality: Left;  . Lung biopsy Left 10/03/2013    Procedure: Left Lung Biopsy;  Surgeon: Melrose Nakayama, MD;  Location: [place];  Service: Thoracic;  Laterality: Left;  . Cardioversion N/A 09/19/2014    Procedure: CARDIOVERSION;  Surgeon: Sanda Klein, MD;  Location: [facility];  Service: Cardiovascular;  Laterality: N/A;     Medications: Current Outpatient Prescriptions  Medication Sig Dispense Refill  . acetaminophen (TYLENOL) 650 MG CR tablet Take 1,950 mg by mouth 2 (two) times daily as needed for pain.    Marland Kitchen albuterol (PROVENTIL HFA;VENTOLIN HFA) 108 (90 BASE) MCG/ACT inhaler Inhale 2 puffs into the lungs  2 (two) times daily as needed for wheezing or shortness of breath.    Marland Kitchen aspirin EC 81 MG tablet Take 81 mg by mouth daily.    Marland Kitchen buPROPion (WELLBUTRIN XL) 300 MG 24 hr tablet Take 300 mg by mouth daily.     . Cholecalciferol (VITAMIN D3) 2000 UNITS TABS Take 2,000 Units by mouth daily.     . citalopram (CELEXA) 20 MG tablet Take 20 mg by mouth daily.     . furosemide (LASIX) 40 MG tablet TAKE 1 TABLET BY MOUTH TWICE A DAY 60 tablet 4  . KLOR-CON M20 20 MEQ tablet TAKE 1 TABLET BY MOUTH TWICE A DAY WITH FUROSEMIDE 60 tablet 4  . metoprolol tartrate (LOPRESSOR) 25 MG tablet Take 25 mg by mouth 2 (two) times daily.     . montelukast (SINGULAIR) 10 MG tablet Take 10 mg by mouth daily.     . NON FORMULARY Place 2.5 L into the nose at bedtime.    . pravastatin (PRAVACHOL) 80 MG tablet Take 80 mg by mouth daily.     Marland Kitchen senna-docusate (SENOKOT-S) 8.6-50 MG per tablet Take 2 tablets by mouth daily as needed (constipation). 1-2 tabs daily prn for constipation    . warfarin (COUMADIN) 7.5 MG tablet Take as directed by anticoagulation clinic 30 tablet 3   No current facility-administered medications for this visit.    Allergies: No Known Allergies  Social History: The patient  reports that she quit smoking about 14 months ago. Her smoking use included Cigarettes. She has a 35 pack-year smoking history. She has never used smokeless tobacco. She reports that she does not drink alcohol or use illicit drugs.   Family History: The patient's family history includes Cancer in her mother; Dementia in her mother; Diabetes in her brother; Emphysema in her mother; Heart disease in her father; Hypertension in her brother, father, mother, and sister; Kidney disease in her father; Kidney failure in her father; Stroke in her brother. There is no history of Heart attack.   Review of Systems: Please see the history of present illness.   Otherwise, the review of systems is positive for none.   All other systems are  reviewed and negative.   Physical Exam: VS:  BP 122/70 mmHg  Pulse 68  Ht 5' 6.5" (1.689 m)  Wt 291 lb 1.9 oz (132.051 kg)  BMI 46.29 kg/m2  SpO2 90% .  BMI Body mass index is 46.29 kg/(m^2).  Wt Readings from Last 3 Encounters:  12/10/14 291 lb 1.9 oz (132.051 kg)  10/23/14 292 lb 1.9 oz (132.505 kg)  10/14/14 293 lb (132.904 kg)    General: Pleasant. Well developed, well nourished and in no acute distress. She remains obese.   HEENT: Normal. Neck: Supple, no JVD, carotid bruits, or masses noted.  Cardiac: Regular rate and rhythm. Valves crisp. Soft outflow murmur noted. No edema.  Respiratory:  Lungs are clear to auscultation bilaterally with normal work of breathing.  GI: Soft and nontender.  MS: No deformity or atrophy. Gait and ROM intact. Skin: Warm and dry. Color is normal.  Neuro:  Strength and sensation are intact and no gross focal deficits noted.  Psych: Alert, appropriate and with normal affect.   LABORATORY DATA:  EKG:  EKG is not ordered today.   Lab Results  Component Value Date   WBC 10.8* 09/18/2014   HGB 10.6* 09/18/2014   HCT 33.0* 09/18/2014   PLT 394.0 09/18/2014   GLUCOSE 89 10/23/2014   ALT 18 09/18/2014   AST 20 09/18/2014   NA 136 10/23/2014   K 4.7 10/23/2014   CL 102 10/23/2014   CREATININE 1.44* 10/23/2014   BUN 23 10/23/2014   CO2 28 10/23/2014   TSH 3.57 09/18/2014   INR 2.3 12/04/2014    Lab Results  Component Value Date   INR 2.3 12/04/2014   INR 3.4 11/04/2014   INR 3.4 10/14/2014     BNP (last 3 results) No results for input(s): BNP in the last 8760 hours.  ProBNP (last 3 results)  Recent Labs  09/23/14 1149  PROBNP 343.0*     Other Studies Reviewed Today:  Echo Study Conclusions from 08/2014  - Left ventricle: The cavity size was normal. There was mild concentric hypertrophy. Systolic function was normal. The estimated ejection fraction was in the range of 55% to 60%. Regional wall motion  abnormalities cannot be excluded. - Aortic valve: A mechanical prosthesis was present. - Aortic root: The aortic root was mildly dilated. - Mitral valve: A mechanical prosthesis was present. - Left atrium: The atrium was mildly dilated. - Pericardium, extracardiac: A small pericardial effusion was identified.  Impressions:  - Technically difficult; Normal LV functon; s/p AVR and MVR; small pericardial effusion   Assessment/Plan:  Atrial flutter, unspecified She is in NSR by exam. I am stopping her amiodarone today. If she were to have recurrent arrhythmia, may need to consider Flecainide. She has had long standing pulmonary issues. She will remain on coumadin  Valvular heart disease S/P AVR and S/P MVR (mitral valve repair) Continue coumadin. Continue SBE prophylaxis. Recent echo with stable MVR and AVR. I do worry about the size of her prosthetic aortic valves. Will need regular echo's. She is currently doing very well.   Essential hypertension BP looks great on her current regimen.  Hyperlipidemia Continue statin. Lab today.   High risk medication use - Amiodarone - I am stopping today.   Renal Insufficiency Followed by Renal  Chronic shortness of breath  Followed by pulmonary - she seems to have improved clinically.   Current medicines are reviewed with the patient today.  The patient does not have concerns regarding medicines other than what has been noted above.  The following changes have been made:  See above.  Labs/ tests ordered today include:    Orders Placed This Encounter  Procedures  . Hepatic function panel  . Lipid panel     Disposition:   FU with me in 4 months.   Patient is agreeable to this plan and will call if any problems develop in the interim.   Signed: [name], RN, ANP-C 12/10/2014 8:15 AM  [facility] [address] [place], [place]  [address] Phone: [phone] Fax: [phone]

## 2014-12-18 ENCOUNTER — Ambulatory Visit (INDEPENDENT_AMBULATORY_CARE_PROVIDER_SITE_OTHER): Payer: Medicare Other | Admitting: *Deleted

## 2014-12-18 DIAGNOSIS — I350 Nonrheumatic aortic (valve) stenosis: Secondary | ICD-10-CM

## 2014-12-18 LAB — POCT INR: INR: 2.1

## 2015-01-02 ENCOUNTER — Ambulatory Visit (INDEPENDENT_AMBULATORY_CARE_PROVIDER_SITE_OTHER): Payer: Medicare Other | Admitting: *Deleted

## 2015-01-02 DIAGNOSIS — I350 Nonrheumatic aortic (valve) stenosis: Secondary | ICD-10-CM

## 2015-01-02 LAB — POCT INR: INR: 3.1

## 2015-01-15 ENCOUNTER — Telehealth (HOSPITAL_COMMUNITY): Payer: Self-pay | Admitting: Cardiac Rehabilitation

## 2015-01-15 NOTE — Telephone Encounter (Signed)
-----   Message from [name], NP sent at 01/15/2015  7:51 AM EDT ----- Regarding: RE: cardiac rehab I would say yes based on my past visits with her.  Cecille Rubin  ----- Message -----    From: Belva Crome, MD    Sent: 01/14/2015   5:39 PM      To: [name], NP Subject: FW: cardiac rehab                              Cecille Rubin, can you answer rhis? I don't know her well enough. ----- Message -----    From: Lowell Guitar, RN    Sent: 01/14/2015  11:26 AM      To: Belva Crome, MD Subject: cardiac rehab                                  Dear [name],   [place] interested in cardiac rehab.  You signed the medicaid order form unfortunately the High Risk category was not completed.  Do you feel the pt would be appropriate to qualify as "exercise capacity limited to less than or equal to 5 mets"?   The other choices are not appropriate for her.    Thank you, Andi Hence, RN, BSN Cardiac Pulmonary Rehab

## 2015-01-23 ENCOUNTER — Telehealth (HOSPITAL_COMMUNITY): Payer: Self-pay | Admitting: *Deleted

## 2015-01-23 ENCOUNTER — Ambulatory Visit (INDEPENDENT_AMBULATORY_CARE_PROVIDER_SITE_OTHER): Payer: Medicare Other | Admitting: *Deleted

## 2015-01-23 ENCOUNTER — Encounter (HOSPITAL_COMMUNITY)
Admission: RE | Admit: 2015-01-23 | Discharge: 2015-01-23 | Disposition: A | Discharge status: Home / Self Care | Payer: Medicare Other | Source: Ambulatory Visit | Attending: Interventional Cardiology | Admitting: Interventional Cardiology

## 2015-01-23 DIAGNOSIS — I350 Nonrheumatic aortic (valve) stenosis: Secondary | ICD-10-CM

## 2015-01-23 DIAGNOSIS — Z48812 Encounter for surgical aftercare following surgery on the circulatory system: Secondary | ICD-10-CM | POA: Insufficient documentation

## 2015-01-23 DIAGNOSIS — Z952 Presence of prosthetic heart valve: Secondary | ICD-10-CM | POA: Insufficient documentation

## 2015-01-23 LAB — POCT INR: INR: 3

## 2015-01-23 NOTE — Telephone Encounter (Signed)
-----   Message from [name], NP sent at 01/23/2015  1:10 PM EDT ----- Regarding: RE: Low o2 saturation at rest  Ok, sounds good.  Cecille Rubin ----- Message -----    From: Rowe Pavy, RN    Sent: 01/23/2015  11:21 AM      To: [name], NP Subject: FW: Low o2 saturation at rest                  After orientation completed, I walked with her around the track to monitor her desaturation.  Pt began at 71 and dropped to 85% within two minutes of walking.  Pt felt her breathing was harder when she reached 87 to very hard at 85% appt with Dr. Lake [name] on 9/20  the earliest they had.  Instructed pt to continue to call for a possible cancellation earlier than 9/20. I will keep you posted on how she does when she starts exercise on Monday. Maurice Small RN ----- Message -----    From: Rowe Pavy, RN    Sent: 01/23/2015   9:32 AM      To: [name], NP Subject: Low o2 saturation at rest                       Pt in today for orientation (no exercise).  Pt vital signs obtained.  O2 sat at rest 86%.  Able to get o2 sat up to 90 with extended purse lip breathing greater than a minute.  Pt states that she use to wear O2. 2.5% at night. Pt took herself off about 2 months ago.  Pt said she stopped using it because she did not have to "work" as hard to get breath in.  Pt does not have a way to monitor her saturation.  Saw Dr. Gwenette Greet in June (right before he left)  He wants her to have a cxr with next follow up with [facility].  Pt has not called for that follow up appt - I will help her make that appt.  Advised pt to resume using O2. Most certainly use during exercise?  May we titrate O2 to maintain o2 sats during rehab?  What would be ideal goal for this particular patient?  Typical is 92 or greater.  Thanks for your input Carlette

## 2015-01-23 NOTE — Progress Notes (Signed)
Cardiac Rehab Medication Review by a Pharmacist  Does the patient  feel that his/her medications are working for him/her?  yes  Has the patient been experiencing any side effects to the medications prescribed?  no  Does the patient measure his/her own blood pressure or blood glucose at home?  no   Does the patient have any problems obtaining medications due to transportation or finances?   no  Understanding of regimen: excellent Understanding of indications: good Potential of compliance: excellent    Pharmacist comments: Pt understands regimen and states compliance to medications.  Pt states that medications are working well for her and has not had any side effects on this current regimen.     Bennye Alm, PharmD Pharmacy Resident [phone]

## 2015-01-23 NOTE — Progress Notes (Signed)
Pt in this morning for cardiac rehab orientation.   Pt vital signs obtained.  O2 sat was low 85%, asym.  This increased to 90% with effective purse lip breathing.  Inquired regarding pulmonary history.  Pt reports that she use to wear O2 at night but took herself off because she no longer felt short of breath. Pt does not have a way to monitor her o2 saturation at home.  Pt seen back in May by Dr. Gwenette Greet.  Pt was to have cxr completed and follow up with Dr. Lake [name].  Pt has not scheduled this appointment. Pt advised to continue to use her O2 until advised by MD.  Pt has concentrator at home and a portable tank.  Pt placed on o2.  After orientation, had pt ambulate around the track on room air.  Pt beginning sat was 89-90% dropped to 87% after walking 50 feet.  Pt with some difficulty breathing but able to converse. Pt walked additional 50 feet o2 sat dropped to 85% pt complained breathing was very difficult.Called pulmonary office and appt made for pt to be seen.  Earliest appt was 9/20. Sent in basket to [employer] regarding the above findings. Asking for use of o2 during exercise and titration parameters.  Cherre Huger, BSN

## 2015-01-23 NOTE — Telephone Encounter (Signed)
-----   Message from [name], NP sent at 01/23/2015  1:10 PM EDT ----- Regarding: RE: Low o2 saturation at rest  Ok, sounds good.  Cecille Rubin ----- Message -----    From: Rowe Pavy, RN    Sent: 01/23/2015  11:21 AM      To: [name], NP Subject: FW: Low o2 saturation at rest                  After orientation completed, I walked with her around the track to monitor her desaturation.  Pt began at 26 and dropped to 85% within two minutes of walking.  Pt felt her breathing was harder when she reached 87 to very hard at 85% appt with Dr. Lake [name] on 9/20  the earliest they had.  Instructed pt to continue to call for a possible cancellation earlier than 9/20. I will keep you posted on how she does when she starts exercise on Monday. Maurice Small RN ----- Message -----    From: Rowe Pavy, RN    Sent: 01/23/2015   9:32 AM      To: [name], NP Subject: Low o2 saturation at rest                       Pt in today for orientation (no exercise).  Pt vital signs obtained.  O2 sat at rest 86%.  Able to get o2 sat up to 90 with extended purse lip breathing greater than a minute.  Pt states that she use to wear O2. 2.5% at night. Pt took herself off about 2 months ago.  Pt said she stopped using it because she did not have to "work" as hard to get breath in.  Pt does not have a way to monitor her saturation.  Saw Dr. Gwenette Greet in June (right before he left)  He wants her to have a cxr with next follow up with [facility].  Pt has not called for that follow up appt - I will help her make that appt.  Advised pt to resume using O2. Most certainly use during exercise?  May we titrate O2 to maintain o2 sats during rehab?  What would be ideal goal for this particular patient?  Typical is 92 or greater.  Thanks for your input Carlette

## 2015-01-27 ENCOUNTER — Encounter (HOSPITAL_COMMUNITY)
Admission: RE | Admit: 2015-01-27 | Discharge: 2015-01-27 | Disposition: A | Discharge status: Home / Self Care | Payer: Medicare Other | Source: Ambulatory Visit | Attending: Interventional Cardiology | Admitting: Interventional Cardiology

## 2015-01-27 DIAGNOSIS — Z952 Presence of prosthetic heart valve: Secondary | ICD-10-CM | POA: Diagnosis not present

## 2015-01-27 DIAGNOSIS — Z48812 Encounter for surgical aftercare following surgery on the circulatory system: Secondary | ICD-10-CM | POA: Diagnosis not present

## 2015-01-27 NOTE — Progress Notes (Addendum)
Pt started cardiac rehab today.  Pt tolerated light exercise without difficulty. VSS with elevation in dys. bp for increased exertion primarily on the airdyne and walking, telemetry-SR first degree with no noted ectopy.  This is noted on her most recent 12 lead ekg - 09/23/14, asymptomatic.  Medication list reconciled.  Pt verbalized compliance with medications and denies barriers to compliance. Pt had stopped taking her lasix because it made her pee but did resume this based upon the advice of rehab staff to resume until seen by md to ask in September PSYCHOSOCIAL ASSESSMENT:  PHQ-0. Pt exhibits positive coping skills, hopeful outlook with supportive family which includes her daughter and twin sister. Pt lives with her daughter and they have a good relationship. No psychosocial needs identified at this time, no psychosocial interventions necessary.    Pt enjoys reading.  Pt remarks that she does not do a lot of hobbies due to limited finances.   Pt cardiac rehab  goal is  to improve breathing, lose and keep off weight.  Pt encouraged to participate in nutrition classes on Tuesdays and education classes on Mondays and Fridays to increase ability to achieve these goals. Will monitor weight and pulse ox checks during exercise to monitor her progress toward achieving this goal.   Pt long term cardiac rehab goal is to be heart smart and make better choices.  Pt oriented to exercise equipment and routine.  Understanding verbalized.Cherre Huger, BSN

## 2015-01-29 ENCOUNTER — Encounter (HOSPITAL_COMMUNITY)
Admission: RE | Admit: 2015-01-29 | Discharge: 2015-01-29 | Disposition: A | Discharge status: Home / Self Care | Payer: Medicare Other | Source: Ambulatory Visit | Attending: Interventional Cardiology | Admitting: Interventional Cardiology

## 2015-01-29 DIAGNOSIS — Z48812 Encounter for surgical aftercare following surgery on the circulatory system: Secondary | ICD-10-CM | POA: Diagnosis not present

## 2015-01-31 ENCOUNTER — Encounter (HOSPITAL_COMMUNITY)
Admission: RE | Admit: 2015-01-31 | Discharge: 2015-01-31 | Disposition: A | Discharge status: Home / Self Care | Payer: Medicare Other | Source: Ambulatory Visit | Attending: Interventional Cardiology | Admitting: Interventional Cardiology

## 2015-01-31 DIAGNOSIS — Z952 Presence of prosthetic heart valve: Secondary | ICD-10-CM | POA: Insufficient documentation

## 2015-01-31 DIAGNOSIS — Z48812 Encounter for surgical aftercare following surgery on the circulatory system: Secondary | ICD-10-CM | POA: Diagnosis not present

## 2015-02-05 ENCOUNTER — Encounter (HOSPITAL_COMMUNITY)
Admission: RE | Admit: 2015-02-05 | Discharge: 2015-02-05 | Disposition: A | Discharge status: Home / Self Care | Payer: Medicare Other | Source: Ambulatory Visit | Attending: Interventional Cardiology | Admitting: Interventional Cardiology

## 2015-02-05 DIAGNOSIS — Z48812 Encounter for surgical aftercare following surgery on the circulatory system: Secondary | ICD-10-CM | POA: Diagnosis not present

## 2015-02-05 DIAGNOSIS — Z952 Presence of prosthetic heart valve: Secondary | ICD-10-CM | POA: Diagnosis not present

## 2015-02-07 ENCOUNTER — Encounter (HOSPITAL_COMMUNITY): Payer: Medicare Other

## 2015-02-09 DIAGNOSIS — J019 Acute sinusitis, unspecified: Secondary | ICD-10-CM | POA: Diagnosis not present

## 2015-02-09 DIAGNOSIS — H6692 Otitis media, unspecified, left ear: Secondary | ICD-10-CM | POA: Diagnosis not present

## 2015-02-10 ENCOUNTER — Encounter (HOSPITAL_COMMUNITY)
Admission: RE | Admit: 2015-02-10 | Discharge: 2015-02-10 | Disposition: A | Discharge status: Home / Self Care | Payer: Medicare Other | Source: Ambulatory Visit | Attending: Interventional Cardiology | Admitting: Interventional Cardiology

## 2015-02-10 DIAGNOSIS — Z952 Presence of prosthetic heart valve: Secondary | ICD-10-CM | POA: Diagnosis not present

## 2015-02-10 DIAGNOSIS — Z48812 Encounter for surgical aftercare following surgery on the circulatory system: Secondary | ICD-10-CM | POA: Diagnosis not present

## 2015-02-10 NOTE — Progress Notes (Signed)
QUALITY OF LIFE SCORE REVIEW Patient score low in the following areas Overall Quality of Life 18.39, health and function 15.00 and physical and spiritual 18.86.  Scores less than 21 are considered low.  Patient quality of life slightly altered by physical constraints which limits ability to perform as prior to recent cardiac illness.  Pt desires to lose weight and to improve shortness of breath.  Pt wear o2 therapy all the time now.Pt is hopeful that she may be able to get off O2 therapy or at least wean down once she see [facility] in follow up next week.  Pt with great support from her twin sister.  They reside together and have a really good relationship and offer mutual support to each other as needed.  Pt also receives emotional support from her daughter and she keeps her granddaughter while her daughter works. Pt feels she is working through things and feels exercise is helping   Offered emotional support and reassurance.  Will continue to monitor and intervene as necessary. Cherre Huger, BSN

## 2015-02-10 NOTE — Progress Notes (Signed)
Nutrition Note Pt unable to attend Nutrition classes. Handouts for Nutrition I and II classes given. Will be available for pt if questions arise after she reads through the handouts given. Continue client-centered nutrition education by RD as part of interdisciplinary care.  Monitor and evaluate progress toward nutrition goal with team.  Derek Mound, M.Ed, RD, LDN, CDE 02/10/2015 12:09 PM

## 2015-02-12 ENCOUNTER — Encounter (HOSPITAL_COMMUNITY)
Admission: RE | Admit: 2015-02-12 | Discharge: 2015-02-12 | Disposition: A | Discharge status: Home / Self Care | Payer: Medicare Other | Source: Ambulatory Visit | Attending: Interventional Cardiology | Admitting: Interventional Cardiology

## 2015-02-12 DIAGNOSIS — Z48812 Encounter for surgical aftercare following surgery on the circulatory system: Secondary | ICD-10-CM | POA: Diagnosis not present

## 2015-02-12 DIAGNOSIS — Z952 Presence of prosthetic heart valve: Secondary | ICD-10-CM | POA: Diagnosis not present

## 2015-02-14 ENCOUNTER — Encounter (HOSPITAL_COMMUNITY)
Admission: RE | Admit: 2015-02-14 | Discharge: 2015-02-14 | Disposition: A | Discharge status: Home / Self Care | Payer: Medicare Other | Source: Ambulatory Visit | Attending: Interventional Cardiology | Admitting: Interventional Cardiology

## 2015-02-14 DIAGNOSIS — Z952 Presence of prosthetic heart valve: Secondary | ICD-10-CM | POA: Diagnosis not present

## 2015-02-14 DIAGNOSIS — Z48812 Encounter for surgical aftercare following surgery on the circulatory system: Secondary | ICD-10-CM | POA: Diagnosis not present

## 2015-02-17 ENCOUNTER — Encounter (HOSPITAL_COMMUNITY)
Admission: RE | Admit: 2015-02-17 | Discharge: 2015-02-17 | Disposition: A | Discharge status: Home / Self Care | Payer: Medicare Other | Source: Ambulatory Visit | Attending: Interventional Cardiology | Admitting: Interventional Cardiology

## 2015-02-17 ENCOUNTER — Encounter: Payer: Self-pay | Admitting: *Deleted

## 2015-02-17 DIAGNOSIS — Z48812 Encounter for surgical aftercare following surgery on the circulatory system: Secondary | ICD-10-CM | POA: Diagnosis not present

## 2015-02-17 DIAGNOSIS — Z952 Presence of prosthetic heart valve: Secondary | ICD-10-CM | POA: Diagnosis not present

## 2015-02-17 NOTE — Progress Notes (Signed)
Pt given rehab report for Dr. Lake [name] to review.  Pt has questions regarding continued O2 therapy s/p double valve surgery.  Pt increases her O2 support to [facility] for ambulation.  Pt unable to complete one lap around the track before she starts to desaturate.  Pt o2 sat check range 90-92% on 3LNc and will drop to the mid to upper 80's with ambulation.  Cherre Huger, BSN

## 2015-02-18 ENCOUNTER — Ambulatory Visit (INDEPENDENT_AMBULATORY_CARE_PROVIDER_SITE_OTHER): Payer: Medicare Other | Admitting: Pulmonary Disease

## 2015-02-18 ENCOUNTER — Ambulatory Visit (INDEPENDENT_AMBULATORY_CARE_PROVIDER_SITE_OTHER)
Admission: RE | Admit: 2015-02-18 | Discharge: 2015-02-18 | Disposition: A | Discharge status: Home / Self Care | Payer: Medicare Other | Source: Ambulatory Visit | Attending: Pulmonary Disease | Admitting: Pulmonary Disease

## 2015-02-18 ENCOUNTER — Encounter: Payer: Self-pay | Admitting: Pulmonary Disease

## 2015-02-18 VITALS — BP 118/66 | HR 67 | Ht 66.0 in | Wt 297.0 lb

## 2015-02-18 DIAGNOSIS — R042 Hemoptysis: Secondary | ICD-10-CM | POA: Diagnosis not present

## 2015-02-18 DIAGNOSIS — R0489 Hemorrhage from other sites in respiratory passages: Secondary | ICD-10-CM

## 2015-02-18 DIAGNOSIS — R0602 Shortness of breath: Secondary | ICD-10-CM | POA: Diagnosis not present

## 2015-02-18 NOTE — Patient Instructions (Signed)
Have the folks at cardiac rehabilitation decrease year oxygen dose as long as it maintains you above 88% We'll call you with the results of today's chest x-ray We will see you back in December 2016

## 2015-02-18 NOTE — Progress Notes (Signed)
Subjective:    Patient ID: [name], female    DOB: Aug 22, 1960, 54 y.o.   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 LN but noncontrast, +++mosaicism  Echo 11/2010:  Normal EF, mod dilatation LA, mild to mod MR, mod AI, mod AS.  HP panel 07/2012:  Normal  Bronch 09/2012:  Culture + Hflu, benign lung tissue on biopsy.   PFTs 07/2012:  No obstruction by spiro, ++ airtrapping on lung volumes, no restriction, DLCO 75% Thoracic surg eval 10/2012:  Pt decided against bx, and never followed up.  F/u 07/2013 with persistent doe >> f/u ct chest with persistent diffuse micronodularity and mosaicism VATS bx 09/2013:  Large numbers of pigmented macrophages filling alveoli with dark brown granules c/w hemosiderin?  No vasculitis or acute inflammation, minimal subpleural fibrosis (reviewed by Dian Situ) Pt quit smoking Autoimmune evaluation 09/2013:  Negative Start prednisone trial 40mg  to 30mg  over 4 weeks, then to 20mg  over 4 weeks, then 15mg /day F/u ct chest with only small change, but pt still with doe.   01/2014 pt d/ced steroids on her own. Echo 12/2013:  Moderate AS with decreased peak gradient from prior, mild MS >> cardiology declined more accurate evaluation despite my urging.  S/p Aortic and MV replacement 07/2014 at [facility] Chief Complaint  Patient presents with  . Follow-up    former [facility] pt being treated for intra-alveolar hemorrhage.  pt states she has SOB with any exertion.  has improved slightly since last ov.  pt in cardiac rehab.     She has been doing OK, but still has some dyspnea, much better since heart surgery.  She can walk for 100 yards or more without stopping, slowly getting better since surgery.   Not cougihng now, no hemoptysis. Doesn't recal coughing much before heart surgery. She saw Dr. Lauris Chroman who recommended the heart valve assessment. She is still using  oxygen with exertion, this is improving thoguh, using less now.   Past Medical History  Diagnosis Date  . Allergic rhinitis   . Noninfectious lymphedema   . Undiagnosed cardiac murmurs   . Mitral stenosis   . Morbid obesity   . Depressive disorder   . Menopausal symptoms   . Mitral and aortic heart valve diseases, unspecified March 2016    s/p AVR with #19 [employer] Jude and s/p MVR with 43mm [employer]. Jude per [name] at [facility]  . Vitamin D deficiency   . HTN (hypertension)   . Hyperlipidemia   . CAO (chronic airflow obstruction)   . Gout   . Hypertriglyceridemia   . Sleep apnea   . Kidney disease     Dr. Graylon Gunning  . Anxiety   . Arthritis   . Shortness of breath     with exertion  . Aortic stenosis       Review of Systems     Objective:   Physical Exam Filed Vitals:   02/18/15 1530  BP: 118/66  Pulse: 67  Height: 5\' 6"  (1.676 m)  Weight: 297 lb (134.718 kg)  SpO2: 94%   Gen: morbidly obese, but well appearing HENT: OP clear, TM's clear, neck supple PULM: CTA B, normal percussion CV: RRR, mechanical S1/S2, chronic right leg edema GI: BS+, soft, nontender Derm: no cyanosis or rash Psyche: normal mood and affect         Assessment & Plan:  Intra-alveolar hemorrhage [name]  was initially doing well after surgery but she says that dyspnea persists.  Some of this is clearly due to her obesity.  However, she says that she never needed oxygen before surgery and now her oxygen needs persist in cardiac rehab.  Today when she ambulated in clinic her O2 saturation remained above 88%.  I'm hoping this means that her lung disease is quite absent at this time.  Plan: Follow-up with me after completing cardiac rehabilitation, we will check her ambulatory O2 saturation again. If she has evidence of persistent hypoxemia than we can repeat a pulmonary function test and perhaps a CT scan Otherwise today will get a chest x-ray     Current outpatient prescriptions:  .   acetaminophen (TYLENOL) 650 MG CR tablet, Take 1,300 mg by mouth 2 (two) times daily as needed for pain. , Disp: , Rfl:  .  albuterol (PROVENTIL HFA;VENTOLIN HFA) 108 (90 BASE) MCG/ACT inhaler, Inhale 2 puffs into the lungs 2 (two) times daily as needed for wheezing or shortness of breath., Disp: , Rfl:  .  aspirin EC 81 MG tablet, Take 81 mg by mouth daily., Disp: , Rfl:  .  buPROPion (WELLBUTRIN XL) 300 MG 24 hr tablet, Take 300 mg by mouth daily. , Disp: , Rfl:  .  Cholecalciferol (VITAMIN D3) 2000 UNITS TABS, Take 2,000 Units by mouth daily. , Disp: , Rfl:  .  citalopram (CELEXA) 20 MG tablet, Take 20 mg by mouth daily. , Disp: , Rfl:  .  furosemide (LASIX) 40 MG tablet, TAKE 1 TABLET BY MOUTH TWICE A DAY (Patient taking differently: Take 1 tablet by mouth once daily), Disp: 60 tablet, Rfl: 4 .  KLOR-CON M20 20 MEQ tablet, TAKE 1 TABLET BY MOUTH TWICE A DAY WITH FUROSEMIDE (Patient taking differently: TAKE 1 TABLET BY MOUTH DAILY), Disp: 60 tablet, Rfl: 4 .  metoprolol tartrate (LOPRESSOR) 25 MG tablet, Take 25 mg by mouth 2 (two) times daily. , Disp: , Rfl:  .  montelukast (SINGULAIR) 10 MG tablet, Take 10 mg by mouth daily. , Disp: , Rfl:  .  NON FORMULARY, Place 2.5 L into the nose at bedtime., Disp: , Rfl:  .  pravastatin (PRAVACHOL) 80 MG tablet, Take 80 mg by mouth daily. , Disp: , Rfl:  .  senna-docusate (SENOKOT-S) 8.6-50 MG per tablet, Take 2 tablets by mouth daily as needed (constipation). 1-2 tabs daily prn for constipation, Disp: , Rfl:  .  warfarin (COUMADIN) 7.5 MG tablet, Take as directed by anticoagulation clinic, Disp: 30 tablet, Rfl: 3

## 2015-02-18 NOTE — Assessment & Plan Note (Addendum)
[name] was initially doing well after surgery but she says that dyspnea persists.  Some of this is clearly due to her obesity.  However, she says that she never needed oxygen before surgery and now her oxygen needs persist in cardiac rehab.  Today when she ambulated in clinic her O2 saturation remained above 88%.  I'm hoping this means that her lung disease is quite absent at this time.  Plan: Follow-up with me after completing cardiac rehabilitation, we will check her ambulatory O2 saturation again. If she has evidence of persistent hypoxemia than we can repeat a pulmonary function test and perhaps a CT scan Otherwise today will get a chest x-ray

## 2015-02-19 ENCOUNTER — Telehealth: Payer: Self-pay | Admitting: Pulmonary Disease

## 2015-02-19 ENCOUNTER — Ambulatory Visit (INDEPENDENT_AMBULATORY_CARE_PROVIDER_SITE_OTHER): Payer: Medicare Other | Admitting: *Deleted

## 2015-02-19 ENCOUNTER — Encounter (HOSPITAL_COMMUNITY)
Admission: RE | Admit: 2015-02-19 | Discharge: 2015-02-19 | Disposition: A | Discharge status: Home / Self Care | Payer: Medicare Other | Source: Ambulatory Visit | Attending: Interventional Cardiology | Admitting: Interventional Cardiology

## 2015-02-19 DIAGNOSIS — I350 Nonrheumatic aortic (valve) stenosis: Secondary | ICD-10-CM | POA: Diagnosis not present

## 2015-02-19 DIAGNOSIS — Z952 Presence of prosthetic heart valve: Secondary | ICD-10-CM | POA: Diagnosis not present

## 2015-02-19 DIAGNOSIS — Z48812 Encounter for surgical aftercare following surgery on the circulatory system: Secondary | ICD-10-CM | POA: Diagnosis not present

## 2015-02-19 LAB — POCT INR: INR: 3.5

## 2015-02-19 NOTE — Progress Notes (Signed)
Reviewed home exercise guidelines with patient including endpoints, temperature precautions, target heart rate and rate of perceived exertion. Pt plans to walk as her mode of home exercise and has acces to a fitness room at her residence. Encouraged pt to get a pulse oximeter to monitor oxygen saturation during exercise. Pt voices understanding of instructions given. Sol Passer, MS, ACSM CCEP

## 2015-02-19 NOTE — Telephone Encounter (Signed)
Per CXR: Please let the patient know that her CXR did not show any new abnormalities --  I spoke with patient about results and she verbalized understanding and had no questions

## 2015-02-21 ENCOUNTER — Encounter (HOSPITAL_COMMUNITY)
Admission: RE | Admit: 2015-02-21 | Discharge: 2015-02-21 | Disposition: A | Discharge status: Home / Self Care | Payer: Medicare Other | Source: Ambulatory Visit | Attending: Interventional Cardiology | Admitting: Interventional Cardiology

## 2015-02-21 DIAGNOSIS — Z952 Presence of prosthetic heart valve: Secondary | ICD-10-CM | POA: Diagnosis not present

## 2015-02-21 DIAGNOSIS — Z48812 Encounter for surgical aftercare following surgery on the circulatory system: Secondary | ICD-10-CM | POA: Diagnosis not present

## 2015-02-24 ENCOUNTER — Encounter (HOSPITAL_COMMUNITY)
Admission: RE | Admit: 2015-02-24 | Discharge: 2015-02-24 | Disposition: A | Discharge status: Home / Self Care | Payer: Medicare Other | Source: Ambulatory Visit | Attending: Interventional Cardiology | Admitting: Interventional Cardiology

## 2015-02-24 DIAGNOSIS — Z48812 Encounter for surgical aftercare following surgery on the circulatory system: Secondary | ICD-10-CM | POA: Diagnosis not present

## 2015-02-24 DIAGNOSIS — Z952 Presence of prosthetic heart valve: Secondary | ICD-10-CM | POA: Diagnosis not present

## 2015-02-24 NOTE — Progress Notes (Signed)
[name] 54 y.o. female Nutrition Note Spoke with pt.  Nutrition Survey reviewed with pt. Pt is not currently following the Therapeutic Lifestyle Changes diet. Pt wants to lose wt. Pt states she has lost "40 lb since surgery." Pt's wt reviewed in EMR. Pt wt is down 20.8 lb over the past 6 months. Pt c/o "3 lb wt gain since starting rehab." Meal planning and preparation discussed. Wt loss tips reviewed. Pt states she was able to lose wt when she tracked what she ate on [email or web address] Pt encouraged to resume keeping a food journal and consider discussing the possibility of gastric bypass/ wt loss surgery with her PCP. Pt reports her twin sister had the gastric sleeve and lost "a lot of wt and gained 20 lb back." Pt is on Coumadin and is aware of the need to follow a diet consistent in Vitamin K intake. Pt expressed understanding of the information reviewed. Pt aware of nutrition education classes offered and is unable to attend nutrition classes. No results found for: HGBA1C  Vitals - 1 value per visit 02/18/2015 01/23/2015 12/10/2014 10/23/2014  Weight (lb) 297 295.86 291.12 292.12   Vitals - 1 value per visit 10/14/2014 09/23/2014 09/19/2014 09/19/2014  Weight (lb) 293 312     Vitals - 1 value per visit 09/12/2014 08/30/2014 08/26/2014  Weight (lb) 303 314 316   Nutrition Diagnosis ? Food-and nutrition-related knowledge deficit related to lack of exposure to information as related to diagnosis of: ? CVD ? Obesity related to excessive energy intake as evidenced by a BMI of 49.6  Nutrition Intervention ? Benefits of adopting Therapeutic Lifestyle Changes discussed when Medficts reviewed. ? Pt to attend the Portion Distortion class ? Pt previously given handouts for Nutrition I class and Nutrition II class on 02/10/15 ? Continue client-centered nutrition education by RD, as part of interdisciplinary care.  Goal(s) ? Pt to identify and limit food sources of saturated fat, trans fat, and  cholesterol ? Pt to identify food quantities necessary to achieve: ? wt loss to a goal wt of 271-289 lb (123.3-131.5 kg) at graduation from cardiac rehab.   Monitor and Evaluate progress toward nutrition goal with team.  Derek Mound, M.Ed, RD, LDN, CDE 02/24/2015 12:13 PM

## 2015-02-26 ENCOUNTER — Telehealth (HOSPITAL_COMMUNITY): Payer: Self-pay | Admitting: Nurse Practitioner

## 2015-02-26 ENCOUNTER — Encounter (HOSPITAL_COMMUNITY): Payer: Medicare Other

## 2015-02-28 ENCOUNTER — Ambulatory Visit (INDEPENDENT_AMBULATORY_CARE_PROVIDER_SITE_OTHER): Payer: Medicare Other | Admitting: Pharmacist

## 2015-02-28 ENCOUNTER — Encounter (HOSPITAL_COMMUNITY): Payer: Medicare Other

## 2015-02-28 DIAGNOSIS — J209 Acute bronchitis, unspecified: Secondary | ICD-10-CM | POA: Diagnosis not present

## 2015-02-28 DIAGNOSIS — Z79899 Other long term (current) drug therapy: Secondary | ICD-10-CM | POA: Diagnosis not present

## 2015-02-28 DIAGNOSIS — I350 Nonrheumatic aortic (valve) stenosis: Secondary | ICD-10-CM

## 2015-02-28 DIAGNOSIS — I482 Chronic atrial fibrillation: Secondary | ICD-10-CM | POA: Diagnosis not present

## 2015-02-28 LAB — POCT INR: INR: 4.1

## 2015-03-03 ENCOUNTER — Encounter (HOSPITAL_COMMUNITY): Payer: Medicare Other

## 2015-03-05 ENCOUNTER — Encounter (HOSPITAL_COMMUNITY): Admission: RE | Admit: 2015-03-05 | Payer: Medicare Other | Source: Ambulatory Visit

## 2015-03-05 ENCOUNTER — Telehealth (HOSPITAL_COMMUNITY): Payer: Self-pay | Admitting: *Deleted

## 2015-03-05 NOTE — Telephone Encounter (Signed)
Returned call from message left on 10/4.  Pt calling out for cardiac rehab for the remaining week due to bronchitis.  Pt is taking prednisone, atbx and respiratory treatment.  Pt feels unable to exercise until her coughing is better.  Pt hopes to return to exercise on Monday. Cherre Huger, BSN

## 2015-03-06 ENCOUNTER — Ambulatory Visit (INDEPENDENT_AMBULATORY_CARE_PROVIDER_SITE_OTHER): Payer: Medicare Other | Admitting: *Deleted

## 2015-03-06 DIAGNOSIS — I350 Nonrheumatic aortic (valve) stenosis: Secondary | ICD-10-CM

## 2015-03-06 LAB — POCT INR: INR: 2.6

## 2015-03-07 ENCOUNTER — Encounter (HOSPITAL_COMMUNITY): Payer: Medicare Other

## 2015-03-10 ENCOUNTER — Encounter (HOSPITAL_COMMUNITY)
Admission: RE | Admit: 2015-03-10 | Discharge: 2015-03-10 | Disposition: A | Discharge status: Home / Self Care | Payer: Medicare Other | Source: Ambulatory Visit | Attending: Interventional Cardiology | Admitting: Interventional Cardiology

## 2015-03-10 DIAGNOSIS — Z952 Presence of prosthetic heart valve: Secondary | ICD-10-CM | POA: Insufficient documentation

## 2015-03-10 DIAGNOSIS — Z48812 Encounter for surgical aftercare following surgery on the circulatory system: Secondary | ICD-10-CM | POA: Insufficient documentation

## 2015-03-10 NOTE — Progress Notes (Signed)
Pt returned to exercise today after absence due to bronchitis.Pt is just about done with prednisone. Pt able to tolerate exercise with no complaints. Cherre Huger, BSN

## 2015-03-12 ENCOUNTER — Telehealth (HOSPITAL_COMMUNITY): Payer: Self-pay | Admitting: Nurse Practitioner

## 2015-03-12 ENCOUNTER — Encounter (HOSPITAL_COMMUNITY): Payer: Medicare Other

## 2015-03-14 ENCOUNTER — Encounter (HOSPITAL_COMMUNITY): Payer: Medicare Other

## 2015-03-17 ENCOUNTER — Encounter (HOSPITAL_COMMUNITY)
Admission: RE | Admit: 2015-03-17 | Discharge: 2015-03-17 | Disposition: A | Discharge status: Home / Self Care | Payer: Medicare Other | Source: Ambulatory Visit | Attending: Interventional Cardiology | Admitting: Interventional Cardiology

## 2015-03-17 DIAGNOSIS — Z952 Presence of prosthetic heart valve: Secondary | ICD-10-CM | POA: Diagnosis not present

## 2015-03-17 DIAGNOSIS — Z48812 Encounter for surgical aftercare following surgery on the circulatory system: Secondary | ICD-10-CM | POA: Diagnosis not present

## 2015-03-19 ENCOUNTER — Ambulatory Visit (INDEPENDENT_AMBULATORY_CARE_PROVIDER_SITE_OTHER): Payer: Medicare Other | Admitting: Pharmacist

## 2015-03-19 ENCOUNTER — Encounter (HOSPITAL_COMMUNITY): Payer: Medicare Other

## 2015-03-19 DIAGNOSIS — I350 Nonrheumatic aortic (valve) stenosis: Secondary | ICD-10-CM

## 2015-03-19 DIAGNOSIS — J449 Chronic obstructive pulmonary disease, unspecified: Secondary | ICD-10-CM | POA: Diagnosis not present

## 2015-03-19 DIAGNOSIS — Z23 Encounter for immunization: Secondary | ICD-10-CM | POA: Diagnosis not present

## 2015-03-19 LAB — POCT INR: INR: 3.8

## 2015-03-21 ENCOUNTER — Encounter (HOSPITAL_COMMUNITY): Payer: Medicare Other

## 2015-03-24 ENCOUNTER — Encounter (HOSPITAL_COMMUNITY): Payer: Medicare Other

## 2015-03-25 ENCOUNTER — Ambulatory Visit (INDEPENDENT_AMBULATORY_CARE_PROVIDER_SITE_OTHER): Payer: Medicare Other | Admitting: Adult Health

## 2015-03-25 ENCOUNTER — Encounter: Payer: Self-pay | Admitting: Adult Health

## 2015-03-25 VITALS — BP 118/70 | HR 68 | Temp 98.2°F | Ht 65.0 in | Wt 303.0 lb

## 2015-03-25 DIAGNOSIS — J449 Chronic obstructive pulmonary disease, unspecified: Secondary | ICD-10-CM | POA: Diagnosis not present

## 2015-03-25 DIAGNOSIS — R042 Hemoptysis: Secondary | ICD-10-CM

## 2015-03-25 DIAGNOSIS — R0489 Hemorrhage from other sites in respiratory passages: Secondary | ICD-10-CM

## 2015-03-25 DIAGNOSIS — J45909 Unspecified asthma, uncomplicated: Secondary | ICD-10-CM | POA: Diagnosis not present

## 2015-03-25 DIAGNOSIS — R0602 Shortness of breath: Secondary | ICD-10-CM

## 2015-03-25 NOTE — Assessment & Plan Note (Signed)
Extensive workup that was unrevealing Patient did improve with serial improvement on chest x-ray. She has no lingering cough and chest x-ray was stable last month. If shortness of breath persists, can consider PFT and CT chest on return

## 2015-03-25 NOTE — Progress Notes (Signed)
Subjective:    Patient ID: [name], female    DOB: 1960/10/11, 54 y.o.   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 Lake [name]    TEST  CT chest 07/2012:  inumerable micronodules bilat in LL, no signif. LN but noncontrast, +++mosaicism  Echo 11/2010:  Normal EF, mod dilatation LA, mild to mod MR, mod AI, mod AS.  HP panel 07/2012:  Normal  Bronch 09/2012:  Culture + Hflu, benign lung tissue on biopsy.   PFTs 07/2012:  No obstruction by spiro, ++ airtrapping on lung volumes, no restriction, DLCO 75% Thoracic surg eval 10/2012:  Pt decided against bx, and never followed up.  F/u 07/2013 with persistent doe >> f/u ct chest with persistent diffuse micronodularity and mosaicism VATS bx 09/2013:  Large numbers of pigmented macrophages filling alveoli with dark brown granules c/w hemosiderin?  No vasculitis or acute inflammation, minimal subpleural fibrosis (reviewed by Dian Situ) Pt quit smoking Autoimmune evaluation 09/2013:  Negative Start prednisone trial 40mg  to 30mg  over 4 weeks, then to 20mg  over 4 weeks, then 15mg /day F/u ct chest with only small change, but pt still with doe.   01/2014 pt d/ced steroids on her own. Echo 12/2013:  Moderate AS with decreased peak gradient from prior, mild MS >> cardiology declined more accurate evaluation despite my urging.  S/p Aortic and MV replacement 07/2014 at [place]    03/25/2015 Acute OV :  Patient presents for an acute office visit She complains that she has persistent shortness of breath. Patient was seen one month ago with no changes to her regimen. Chest x-ray that time showed mildly prominent markings along the bases.  Patient underwent an aortic and mitral valve replacement in March of this year. At [facility]. She has been undergoing cardiac rehabilitation for the last 6 months. She does say that she has made progress but continues to get short of breath with activity. She is frustrated that  she has not able to do more activities and has not lost weight.  She does not have any cough but does have some postnasal drainage, nasal stuffiness and occasional wheezing. Denies any cough, chest congestion, fever, nausea or vomitting. She remains on Symbicort twice daily. She is also on Singulair and antihistamine nasal spray Spirometry today shows an FEV1 at 62%, ratio 78, FVC of 63%. Her BMI is at 50. Last visit. She did not desaturate with walking.     Review of Systems Constitutional:   No  weight loss, night sweats,  Fevers, chills, fatigue, or  lassitude.  HEENT:   No headaches,  Difficulty swallowing,  Tooth/dental problems, or  Sore throat,                No sneezing, itching, ear ache, nasal congestion, post nasal drip,   CV:  No chest pain,  Orthopnea, PND, swelling in lower extremities, anasarca, dizziness, palpitations, syncope.   GI  No heartburn, indigestion, abdominal pain, nausea, vomiting, diarrhea, change in bowel habits, loss of appetite, bloody stools.   Resp:   No chest wall deformity  Skin: no rash or lesions.  GU: no dysuria, change in color of urine, no urgency or frequency.  No flank pain, no hematuria   MS:  No joint pain or swelling.  No decreased range of motion.  No back pain.  Psych:  No change in mood or affect. No depression or anxiety.  No memory loss.  Objective:   Physical Exam GEN: A/Ox3; pleasant , NAD, morbidly obese  HEENT:  [place]/AT,  EACs-clear, TMs-wnl, NOSE-clear, THROAT-clear, no lesions, no postnasal drip or exudate noted.   NECK:  Supple w/ fair ROM; no JVD; normal carotid impulses w/o bruits; no thyromegaly or nodules palpated; no lymphadenopathy.  RESP  Clear  P & A; w/o, wheezes/ rales/ or rhonchi.no accessory muscle use, no dullness to percussion  CARD:  RRR, no m/r/g  ,  pulses intact, no cyanosis or clubbing. Right leg lymphadema , no edema on the left  GI:   Soft & nt; nml bowel sounds; no organomegaly or  masses detected.  Musco: Warm bil, no deformities or joint swelling noted.   Neuro: alert, no focal deficits noted.    Skin: Warm, no lesions or rashes         Assessment & Plan:

## 2015-03-25 NOTE — Patient Instructions (Signed)
May use Allegra 180mg  daily As needed  Drainage .  Saline nasal rinses As needed   Continue on Symbicort .  Continue with cardiac rehab.  Follow up at [facility] next month as planned with cardiology  Please contact office for sooner follow up if symptoms do not improve or worsen or seek emergency care  follow up Dr. Lake [name] in 3 months and As needed

## 2015-03-25 NOTE — Assessment & Plan Note (Addendum)
Mild flare with allergic rhinitis Most recent chest x-ray with no acute process. 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 She has no desaturations with ambulation. Spirometry today shows no significant obstruction with some mild restrictive changes, which is suspected. Due to her  morbid obesity. Her activity and exercise tolerance is slowly improving. She is encouraged to continue with cardiac rehabilitation. We'll add an antihistamine to try to help with postnasal drip symptoms. If symptoms persist, consider a PFT and CT chest on return in January Also, could consider changing her nonselective beta blocker to see if this had any bearing on her shortness of breath.  Plan May use Allegra 180mg  daily As needed  Drainage .  Saline nasal rinses As needed   Continue on Symbicort .  Continue with cardiac rehab.  Follow up at [facility] next month as planned with cardiology  Please contact office for sooner follow up if symptoms do not improve or worsen or seek emergency care  follow up Dr. Lake [name] in 3 months and As needed

## 2015-03-26 ENCOUNTER — Telehealth (HOSPITAL_COMMUNITY): Payer: Self-pay | Admitting: Nurse Practitioner

## 2015-03-26 ENCOUNTER — Encounter (HOSPITAL_COMMUNITY): Payer: Medicare Other

## 2015-03-26 NOTE — Progress Notes (Signed)
Reviewed, I agree with this plan of care 

## 2015-03-27 ENCOUNTER — Other Ambulatory Visit: Payer: Self-pay | Admitting: Cardiovascular Disease

## 2015-03-28 ENCOUNTER — Encounter (HOSPITAL_COMMUNITY): Admission: RE | Admit: 2015-03-28 | Payer: Medicare Other | Source: Ambulatory Visit

## 2015-03-28 ENCOUNTER — Telehealth (HOSPITAL_COMMUNITY): Payer: Self-pay | Admitting: *Deleted

## 2015-03-31 ENCOUNTER — Encounter (HOSPITAL_COMMUNITY)
Admission: RE | Admit: 2015-03-31 | Discharge: 2015-03-31 | Disposition: A | Discharge status: Home / Self Care | Payer: Medicare Other | Source: Ambulatory Visit | Attending: Interventional Cardiology | Admitting: Interventional Cardiology

## 2015-03-31 DIAGNOSIS — Z952 Presence of prosthetic heart valve: Secondary | ICD-10-CM | POA: Diagnosis not present

## 2015-03-31 DIAGNOSIS — Z48812 Encounter for surgical aftercare following surgery on the circulatory system: Secondary | ICD-10-CM | POA: Diagnosis not present

## 2015-04-02 ENCOUNTER — Encounter (HOSPITAL_COMMUNITY)
Admission: RE | Admit: 2015-04-02 | Discharge: 2015-04-02 | Disposition: A | Discharge status: Home / Self Care | Payer: Medicare Other | Source: Ambulatory Visit | Attending: Interventional Cardiology | Admitting: Interventional Cardiology

## 2015-04-02 ENCOUNTER — Ambulatory Visit (INDEPENDENT_AMBULATORY_CARE_PROVIDER_SITE_OTHER): Payer: Medicare Other | Admitting: *Deleted

## 2015-04-02 DIAGNOSIS — Z48812 Encounter for surgical aftercare following surgery on the circulatory system: Secondary | ICD-10-CM | POA: Diagnosis not present

## 2015-04-02 DIAGNOSIS — Z952 Presence of prosthetic heart valve: Secondary | ICD-10-CM | POA: Insufficient documentation

## 2015-04-02 DIAGNOSIS — I350 Nonrheumatic aortic (valve) stenosis: Secondary | ICD-10-CM | POA: Diagnosis not present

## 2015-04-02 LAB — POCT INR: INR: 3.1

## 2015-04-02 NOTE — Progress Notes (Signed)
Pt returned to exercise on Monday.  Pt with increase oxygen support.  Weight elevation noted. Pt was on prednisone therapy for bronchitis.  Pt returns today with additional weight gain despite taking additional lasix.  Pt was to see [name] next week but the appt was rescheduled by the office x 2.  Pt now will see Cecille Rubin at the end of this month.  Will send in basket to see if pt can be seen earlier. Cherre Huger, BSN

## 2015-04-04 ENCOUNTER — Telehealth (HOSPITAL_COMMUNITY): Payer: Self-pay | Admitting: Nurse Practitioner

## 2015-04-04 ENCOUNTER — Encounter (HOSPITAL_COMMUNITY): Payer: Medicare Other

## 2015-04-07 ENCOUNTER — Emergency Department (HOSPITAL_COMMUNITY): Payer: Medicare Other

## 2015-04-07 ENCOUNTER — Telehealth (HOSPITAL_COMMUNITY): Payer: Self-pay | Admitting: Nurse Practitioner

## 2015-04-07 ENCOUNTER — Other Ambulatory Visit: Payer: Self-pay | Admitting: *Deleted

## 2015-04-07 ENCOUNTER — Inpatient Hospital Stay (HOSPITAL_COMMUNITY)
Admission: EM | Admit: 2015-04-07 | Discharge: 2015-04-10 | DRG: 811 | Disposition: A | Discharge status: Home / Self Care | Payer: Medicare Other | Attending: Internal Medicine | Admitting: Internal Medicine

## 2015-04-07 ENCOUNTER — Telehealth: Payer: Self-pay | Admitting: *Deleted

## 2015-04-07 ENCOUNTER — Encounter (HOSPITAL_COMMUNITY): Payer: Medicare Other

## 2015-04-07 ENCOUNTER — Encounter (HOSPITAL_COMMUNITY): Payer: Self-pay | Admitting: Family Medicine

## 2015-04-07 ENCOUNTER — Encounter: Payer: Self-pay | Admitting: Nurse Practitioner

## 2015-04-07 ENCOUNTER — Ambulatory Visit (INDEPENDENT_AMBULATORY_CARE_PROVIDER_SITE_OTHER): Payer: Medicare Other | Admitting: Nurse Practitioner

## 2015-04-07 VITALS — BP 104/62 | HR 68 | Ht 66.0 in | Wt 302.0 lb

## 2015-04-07 DIAGNOSIS — Z7901 Long term (current) use of anticoagulants: Secondary | ICD-10-CM

## 2015-04-07 DIAGNOSIS — I129 Hypertensive chronic kidney disease with stage 1 through stage 4 chronic kidney disease, or unspecified chronic kidney disease: Secondary | ICD-10-CM | POA: Diagnosis present

## 2015-04-07 DIAGNOSIS — R918 Other nonspecific abnormal finding of lung field: Secondary | ICD-10-CM

## 2015-04-07 DIAGNOSIS — Z954 Presence of other heart-valve replacement: Secondary | ICD-10-CM | POA: Diagnosis not present

## 2015-04-07 DIAGNOSIS — J449 Chronic obstructive pulmonary disease, unspecified: Secondary | ICD-10-CM | POA: Diagnosis present

## 2015-04-07 DIAGNOSIS — Z6841 Body Mass Index (BMI) 40.0 and over, adult: Secondary | ICD-10-CM

## 2015-04-07 DIAGNOSIS — G4730 Sleep apnea, unspecified: Secondary | ICD-10-CM | POA: Diagnosis present

## 2015-04-07 DIAGNOSIS — I10 Essential (primary) hypertension: Secondary | ICD-10-CM | POA: Diagnosis present

## 2015-04-07 DIAGNOSIS — N183 Chronic kidney disease, stage 3 unspecified: Secondary | ICD-10-CM | POA: Diagnosis present

## 2015-04-07 DIAGNOSIS — Z952 Presence of prosthetic heart valve: Secondary | ICD-10-CM

## 2015-04-07 DIAGNOSIS — D649 Anemia, unspecified: Principal | ICD-10-CM | POA: Diagnosis present

## 2015-04-07 DIAGNOSIS — J189 Pneumonia, unspecified organism: Secondary | ICD-10-CM | POA: Diagnosis not present

## 2015-04-07 DIAGNOSIS — N1831 Chronic kidney disease, stage 3a: Secondary | ICD-10-CM | POA: Diagnosis present

## 2015-04-07 DIAGNOSIS — Z87891 Personal history of nicotine dependence: Secondary | ICD-10-CM

## 2015-04-07 DIAGNOSIS — R0600 Dyspnea, unspecified: Secondary | ICD-10-CM

## 2015-04-07 DIAGNOSIS — Z7982 Long term (current) use of aspirin: Secondary | ICD-10-CM

## 2015-04-07 DIAGNOSIS — R0602 Shortness of breath: Secondary | ICD-10-CM | POA: Diagnosis not present

## 2015-04-07 DIAGNOSIS — I440 Atrioventricular block, first degree: Secondary | ICD-10-CM | POA: Diagnosis present

## 2015-04-07 DIAGNOSIS — I890 Lymphedema, not elsewhere classified: Secondary | ICD-10-CM | POA: Diagnosis not present

## 2015-04-07 DIAGNOSIS — J849 Interstitial pulmonary disease, unspecified: Secondary | ICD-10-CM

## 2015-04-07 DIAGNOSIS — E781 Pure hyperglyceridemia: Secondary | ICD-10-CM | POA: Diagnosis present

## 2015-04-07 DIAGNOSIS — F329 Major depressive disorder, single episode, unspecified: Secondary | ICD-10-CM | POA: Diagnosis present

## 2015-04-07 DIAGNOSIS — I4892 Unspecified atrial flutter: Secondary | ICD-10-CM | POA: Diagnosis not present

## 2015-04-07 DIAGNOSIS — R590 Localized enlarged lymph nodes: Secondary | ICD-10-CM | POA: Diagnosis present

## 2015-04-07 DIAGNOSIS — R0609 Other forms of dyspnea: Secondary | ICD-10-CM | POA: Insufficient documentation

## 2015-04-07 DIAGNOSIS — E559 Vitamin D deficiency, unspecified: Secondary | ICD-10-CM | POA: Diagnosis present

## 2015-04-07 DIAGNOSIS — J4489 Other specified chronic obstructive pulmonary disease: Secondary | ICD-10-CM | POA: Diagnosis present

## 2015-04-07 LAB — COMPREHENSIVE METABOLIC PANEL
ALT: 17 U/L (ref 14–54)
AST: 27 U/L (ref 15–41)
Albumin: 3.7 g/dL (ref 3.5–5.0)
Alkaline Phosphatase: 67 U/L (ref 38–126)
Anion gap: 9 (ref 5–15)
BUN: 28 mg/dL — ABNORMAL HIGH (ref 6–20)
CO2: 29 mmol/L (ref 22–32)
Calcium: 9.2 mg/dL (ref 8.9–10.3)
Chloride: 98 mmol/L — ABNORMAL LOW (ref 101–111)
Creatinine, Ser: 1.47 mg/dL — ABNORMAL HIGH (ref 0.44–1.00)
GFR calc Af Amer: 45 mL/min — ABNORMAL LOW (ref >60)
GFR calc non Af Amer: 39 mL/min — ABNORMAL LOW (ref >60)
Glucose, Bld: 70 mg/dL (ref 65–99)
Potassium: 4.6 mmol/L (ref 3.5–5.1)
Sodium: 136 mmol/L (ref 135–145)
Total Bilirubin: 0.9 mg/dL (ref 0.3–1.2)
Total Protein: 6.8 g/dL (ref 6.5–8.1)

## 2015-04-07 LAB — CBC WITH DIFFERENTIAL/PLATELET
Basophils Absolute: 0.1 10*3/uL (ref 0.0–0.1)
Basophils Relative: 1 % (ref 0–1)
Eosinophils Absolute: 0.2 10*3/uL (ref 0.0–0.7)
Eosinophils Relative: 2 % (ref 0–5)
HCT: 27.4 % — ABNORMAL LOW (ref 36.0–46.0)
Hemoglobin: 7.9 g/dL — ABNORMAL LOW (ref 12.0–15.0)
Lymphocytes Relative: 12 % (ref 12–46)
Lymphs Abs: 1.1 10*3/uL (ref 0.7–4.0)
MCH: 23.3 pg — ABNORMAL LOW (ref 26.0–34.0)
MCHC: 28.8 g/dL — ABNORMAL LOW (ref 30.0–36.0)
MCV: 80.8 fL (ref 78.0–100.0)
MPV: 8.6 fL (ref 8.6–12.4)
Monocytes Absolute: 0.9 10*3/uL (ref 0.1–1.0)
Monocytes Relative: 10 % (ref 3–12)
Neutro Abs: 6.6 10*3/uL (ref 1.7–7.7)
Neutrophils Relative %: 75 % (ref 43–77)
Platelets: 348 10*3/uL (ref 150–400)
RBC: 3.39 MIL/uL — ABNORMAL LOW (ref 3.87–5.11)
RDW: 18.5 % — ABNORMAL HIGH (ref 11.5–15.5)
WBC: 8.8 10*3/uL (ref 4.0–10.5)

## 2015-04-07 LAB — BASIC METABOLIC PANEL
BUN: 28 mg/dL — ABNORMAL HIGH (ref 7–25)
CO2: 30 mmol/L (ref 20–31)
Calcium: 9.3 mg/dL (ref 8.6–10.4)
Chloride: 99 mmol/L (ref 98–110)
Creat: 1.42 mg/dL — ABNORMAL HIGH (ref 0.50–1.05)
Glucose, Bld: 88 mg/dL (ref 65–99)
Potassium: 5 mmol/L (ref 3.5–5.3)
Sodium: 139 mmol/L (ref 135–146)

## 2015-04-07 LAB — CBC
HCT: 26.6 % — ABNORMAL LOW (ref 36.0–46.0)
Hemoglobin: 7.6 g/dL — ABNORMAL LOW (ref 12.0–15.0)
MCH: 24.2 pg — ABNORMAL LOW (ref 26.0–34.0)
MCHC: 28.6 g/dL — ABNORMAL LOW (ref 30.0–36.0)
MCV: 84.7 fL (ref 78.0–100.0)
Platelets: 309 10*3/uL (ref 150–400)
RBC: 3.14 MIL/uL — ABNORMAL LOW (ref 3.87–5.11)
RDW: 18.4 % — ABNORMAL HIGH (ref 11.5–15.5)
WBC: 7.7 10*3/uL (ref 4.0–10.5)

## 2015-04-07 LAB — POC OCCULT BLOOD, ED: Fecal Occult Bld: NEGATIVE

## 2015-04-07 LAB — PROTIME-INR
INR: 2.59 — ABNORMAL HIGH (ref 0.00–1.49)
Prothrombin Time: 27.4 seconds — ABNORMAL HIGH (ref 11.6–15.2)

## 2015-04-07 LAB — BRAIN NATRIURETIC PEPTIDE: Brain Natriuretic Peptide: 281.7 pg/mL — ABNORMAL HIGH (ref 0.0–100.0)

## 2015-04-07 MED ORDER — IOHEXOL 350 MG/ML SOLN
100.0000 mL | Freq: Once | INTRAVENOUS | Status: AC | PRN
Start: 2015-04-07 — End: 2015-04-07
  Administered 2015-04-07: 80 mL via INTRAVENOUS

## 2015-04-07 NOTE — Progress Notes (Signed)
CARDIOLOGY OFFICE NOTE  Date:  04/07/2015    [name] Date of Birth: 12-Nov-1960 Medical Record [id number]  PCP:  [name], FNP  Cardiologist:  Tamala Julian    Chief Complaint  Patient presents with  . Shortness of Breath    Work in/follow up - seen for [name]  . Cardiac Valve Problem    History of Present Illness: [name] is a 54 y.o. female who presents today for a work in/follow up visit.   Seen for [name]. She is followed by me.   She has a hx of valvular heart disease, idiopathic pulmonary hemosiderosis, CKD, HTN, HL, OSA. She underwent mechanical MVR and mechanical AVR with [name] at [facility] 07/2014. From CareEverywhere: "Cath and echo showed severe aortic stenosis, moderate mitral stenosis, PHBP, very small LV cavity and small aortic root with preserved ventricular function and no significant coronary disease. The patient was taken to the operating room by [name], on 08/06/2014 where she underwent Aortic valve replacement 39mm StJude Regent valve, mitral valve replacement 31mm StJude mechanical valve finding rheumatic disease."      Post op she was noted to be in AFlutter. Rate was uncontrolled. Amiodarone dose was increased as well as the beta-blocker. She has had tolerance issues with amiodarone but was subsequently cardioverted. She underwent cardioversion on 4/21 - the dictated note says the post EKG was atrial flutter however the EKG post cardioversion showed NSR. We brought her back for an early post cardioversion visit to recheck/verify her EKG - she was in NSR.  I last saw her in July - had gotten her off of Midodrine that she had been put on preop. She was doing well.       She was seen by Dr. Lake [name] back in September - he felt like she was doing well but she noted that she still had shortness of breath and had never had to have oxygen prior to her heart surgery.  He wanted to see her back after she completed rehab. If  she had evidence of persistent hypoxemia, then he wanted a PFT and possible CT scan. Encouraged her to continue with efforts at diet/weight loss/exercise.   Saw Tammy the PA there towards the end of last month - this was after going to her PCP twice with "bronchitis" and weight gain - has been treated with antibiotics, prednisone and Symbicort - but was more short of breath and wheezing when she saw Tammy. Felt to have allergic component as well. Her CXR done previously was ok but again noted to consider PFT and possible CT chest.         Comes here today. Here alone. She remains short of breath. Feels like "something is wrong". Can't get off her oxygen - actually on higher flow than when I saw her previously. Weight is up. No actual swelling. She says people say "I look really pale" and she does look pretty pale to me today. Will get a little lightheaded when she exerts. Chest feels "sore" with a deep breath - she thinks this is from Symbicort. She has never had colonoscopy. She is overdue for mammogram.   Past Medical History  Diagnosis Date  . Allergic rhinitis   . Noninfectious lymphedema   . Undiagnosed cardiac murmurs   . Mitral stenosis   . Morbid obesity ([place])   . Depressive disorder   . Menopausal symptoms   . Mitral and aortic heart valve diseases, unspecified March 2016  s/p AVR with #19 St Jude and s/p MVR with 20mm St. Jude per [name] at [facility]  . Vitamin D deficiency   . HTN (hypertension)   . Hyperlipidemia   . CAO (chronic airflow obstruction) (HCC)   . Gout   . Hypertriglyceridemia   . Sleep apnea   . Kidney disease     Dr. Graylon Gunning  . Anxiety   . Arthritis   . Shortness of breath     with exertion  . Aortic stenosis     Past Surgical History  Procedure Laterality Date  . Video bronchoscopy Bilateral 10/25/2012    Procedure: VIDEO BRONCHOSCOPY WITH FLUORO;  Surgeon: Kathee Delton, MD;  Location: [facility];  Service: Cardiopulmonary;  Laterality: Bilateral;   . Tonsillectomy    . Video assisted thoracoscopy Left 10/03/2013    Procedure: Left Video Assited Thoracoscopy;  Surgeon: Melrose Nakayama, MD;  Location: [place];  Service: Thoracic;  Laterality: Left;  . Lung biopsy Left 10/03/2013    Procedure: Left Lung Biopsy;  Surgeon: Melrose Nakayama, MD;  Location: [place];  Service: Thoracic;  Laterality: Left;  . Cardioversion N/A 09/19/2014    Procedure: CARDIOVERSION;  Surgeon: Sanda Klein, MD;  Location: [facility];  Service: Cardiovascular;  Laterality: N/A;     Medications: Current Outpatient Prescriptions  Medication Sig Dispense Refill  . furosemide (LASIX) 40 MG tablet Take 40 mg by mouth 2 (two) times daily.    . NON FORMULARY Place 2.5 L into the nose continuous as needed (sob).     . potassium chloride SA (K-DUR,KLOR-CON) 20 MEQ tablet Take 20 mEq by mouth 2 (two) times daily.    . sodium chloride (OCEAN) 0.65 % SOLN nasal spray Place 1 spray into both nostrils as needed for congestion.    Marland Kitchen acetaminophen (TYLENOL) 650 MG CR tablet Take 1,300 mg by mouth 2 (two) times daily as needed for pain.     Marland Kitchen albuterol (PROVENTIL HFA;VENTOLIN HFA) 108 (90 BASE) MCG/ACT inhaler Inhale 2 puffs into the lungs 2 (two) times daily as needed for wheezing or shortness of breath.    Marland Kitchen albuterol (PROVENTIL) (2.5 MG/3ML) 0.083% nebulizer solution Inhale 2.5 mg into the lungs every 4 (four) hours as needed.    Marland Kitchen aspirin EC 81 MG tablet Take 81 mg by mouth daily.    . budesonide-formoterol (SYMBICORT) 80-4.5 MCG/ACT inhaler Inhale 2 puffs into the lungs 2 (two) times daily.    Marland Kitchen buPROPion (WELLBUTRIN XL) 300 MG 24 hr tablet Take 300 mg by mouth daily.     . Cholecalciferol (VITAMIN D3) 2000 UNITS TABS Take 2,000 Units by mouth daily.     . citalopram (CELEXA) 20 MG tablet Take 20 mg by mouth daily.     . fexofenadine (ALLEGRA) 180 MG tablet Take 180 mg by mouth daily.    . metoprolol tartrate (LOPRESSOR) 25 MG tablet Take 25 mg by mouth 2 (two) times  daily.     . montelukast (SINGULAIR) 10 MG tablet Take 10 mg by mouth daily.     . pravastatin (PRAVACHOL) 80 MG tablet Take 80 mg by mouth daily.     Marland Kitchen senna-docusate (SENOKOT-S) 8.6-50 MG per tablet Take 2 tablets by mouth daily as needed (constipation). 1-2 tabs daily prn for constipation    . warfarin (COUMADIN) 7.5 MG tablet TAKE AS DIRECTED BY ANTICOAGULATION CLINIC 30 tablet 3   No current facility-administered medications for this visit.    Allergies: No Known Allergies  Social History: The patient  reports that she quit smoking about 18 months ago. Her smoking use included Cigarettes. She has a 35 pack-year smoking history. She has never used smokeless tobacco. She reports that she does not drink alcohol or use illicit drugs.   Family History: The patient's family history includes Cancer in her mother; Dementia in her mother; Diabetes in her brother; Emphysema in her mother; Heart disease in her father; Hypertension in her brother, father, mother, and sister; Kidney disease in her father; Kidney failure in her father; Stroke in her brother. There is no history of Heart attack.   Review of Systems: Please see the history of present illness.   Otherwise, the review of systems is positive for none.   All other systems are reviewed and negative.   Physical Exam: VS:  BP 104/62 mmHg  Pulse 68  Ht 5\' 6"  (1.676 m)  Wt 302 lb (136.986 kg)  BMI 48.77 kg/m2  SpO2 97% .  BMI Body mass index is 48.77 kg/(m^2).  Wt Readings from Last 3 Encounters:  04/07/15 302 lb (136.986 kg)  03/25/15 303 lb (137.44 kg)  02/18/15 297 lb (134.718 kg)    General: Pleasant. She is obese. Weight is up. She is in no acute distress. She does look pale.  HEENT: Normal. Neck: Supple, no JVD, carotid bruits, or masses noted.  Cardiac: Regular rate and rhythm. Valves crisp - does have outflow murmur. No edema.  Respiratory:  Lungs are clear to auscultation bilaterally with normal work of breathing.  GI:  Soft and nontender.  MS: No deformity or atrophy. Gait and ROM intact. Skin: Warm and dry. Color is normal.  Neuro:  Strength and sensation are intact and no gross focal deficits noted.  Psych: Alert, appropriate and with normal affect.   LABORATORY DATA:  EKG:  EKG is ordered today. This demonstrates NSR with 1st degree AV block  Lab Results  Component Value Date   WBC 10.8* 09/18/2014   HGB 10.6* 09/18/2014   HCT 33.0* 09/18/2014   PLT 394.0 09/18/2014   GLUCOSE 89 10/23/2014   CHOL 158 12/10/2014   TRIG 132.0 12/10/2014   HDL 49.30 12/10/2014   LDLCALC 82 12/10/2014   ALT 17 12/10/2014   AST 22 12/10/2014   NA 136 10/23/2014   K 4.7 10/23/2014   CL 102 10/23/2014   CREATININE 1.44* 10/23/2014   BUN 23 10/23/2014   CO2 28 10/23/2014   TSH 3.57 09/18/2014   INR 3.1 04/02/2015   Lab Results  Component Value Date   INR 3.1 04/02/2015   INR 3.8 03/19/2015   INR 2.6 03/06/2015     BNP (last 3 results) No results for input(s): BNP in the last 8760 hours.  ProBNP (last 3 results)  Recent Labs  09/23/14 1149  PROBNP 343.0*     Other Studies Reviewed Today:  Echo Study Conclusions from 08/2014  - Left ventricle: The cavity size was normal. There was mild concentric hypertrophy. Systolic function was normal. The estimated ejection fraction was in the range of 55% to 60%. Regional wall motion abnormalities cannot be excluded. - Aortic valve: A mechanical prosthesis was present. - Aortic root: The aortic root was mildly dilated. - Mitral valve: A mechanical prosthesis was present. - Left atrium: The atrium was mildly dilated. - Pericardium, extracardiac: A small pericardial effusion was identified.  Impressions:  - Technically difficult; Normal LV functon; s/p AVR and MVR; small pericardial effusion   Assessment/Plan:  Atrial flutter, unspecified She  is in NSR by exam and by EKG. She remains off of amiodarone. Remains on coumadin as well.    Valvular heart disease S/P AVR and S/P MVR (mitral valve repair) Continue coumadin. Continue SBE prophylaxis. Echo with stable MVR and AVR from 6 months ago. I do worry about the size of her prosthetic aortic valves in relationship to her. Now with more dyspnea - would hold on repeating echo for now.    Essential hypertension BP looks great on her current regimen.  Hyperlipidemia Continue statin.    Renal Insufficiency Followed by Renal  Chronic shortness of breath  Followed by pulmonary - she was doing much better back in the summer. Now with noted hypoxia and requiring higher O2 requirements. With walking just 20 feet in our office - she dropped to 77% and got a little lightheaded. She needs to wear her oxygen continuously. Will check lab today (to rule out profound anemia) and arrange for CT chest tomorrow and PFTs with diffusion. Will alert Dr. Lake [name]. Will hold on cardiac rehab at least for this week until we get this sorted out better.   Current medicines are reviewed with the patient today.  The patient does not have concerns regarding medicines other than what has been noted above.  The following changes have been made:  See above.  Labs/ tests ordered today include:    Orders Placed This Encounter  Procedures  . Brain natriuretic peptide  . Basic metabolic panel  . CBC  . EKG 12-Lead     Disposition:   Further disposition to follow.    Patient is agreeable to this plan and will call if any problems develop in the interim.   Signed: [name], RN, ANP-C 04/07/2015 11:59 AM  [facility] [address] [place], [place]  [address] Phone: [phone] Fax: [phone]

## 2015-04-07 NOTE — Patient Instructions (Addendum)
We will be checking the following labs today - CBC, BNP and BMET   Medication Instructions:    Continue with your current medicines.     Testing/Procedures To Be Arranged:  PFTs with diffusion - at [facility] - for hypoxia  Chest CT without contrast - for hypoxia - I would like this for tomorrow.   Follow-Up:   Will see how your tests turn out and then decide about when to come back.   I will send Dr. Lake [name] a note     Other Special Instructions:   Wearing oxygen all the time now.   Hold on cardiac rehab for this week.     If you need a refill on your cardiac medications before your next appointment, please call your pharmacy.   Call the [place] office at [phone] if you have any questions, problems or concerns.

## 2015-04-07 NOTE — ED Notes (Signed)
Pt sent here by doctor for low hgb. sts she has been weak, dizzy, and SOB on home o2 more than usual.

## 2015-04-07 NOTE — Addendum Note (Signed)
Addended by: Andres Ege on: 04/07/2015 12:49 PM   Modules accepted: Orders

## 2015-04-07 NOTE — Telephone Encounter (Signed)
S/w pt is coming in today @ 11:30 am will route to [employer] to [employer]

## 2015-04-07 NOTE — Telephone Encounter (Signed)
-----   Message from [name], NP sent at 04/03/2015 11:16 AM EDT ----- Regarding: RE: Ability to move pt follow up appt up with you Carlette, I am out of the office until Monday.   I will contact her on Monday and get her appointment changed.   Please ask her to call the office if she has issues in the interim.  Thanks Cecille Rubin ----- Message -----    From: Rowe Pavy, RN    Sent: 04/02/2015   5:06 PM      To: [name], NP Subject: Ability to move pt follow up appt up with you   Cecille Rubin,  The above pt recently returned to rehab after absence due to bronchitis.  Pt with increased oxygen usage -4lncc verses 2lncc all the time verses exertional only.  Pt with weight gain from Monday to today of about 3 pounds despite doubling lasix for the last two daysPt was scheduled to see you next week. This appt was rescheduled x 2 by the office.  Pt will see you at the end of this month.  Would it be possible to see pt earlier?  Ideally pt could do a M W F morning prior to exercise at 11:15.  Your thoughts?? I sent her rehab report to both the [address] and [facility] locations - unsure where you would be next.  Maurice Small RN

## 2015-04-08 ENCOUNTER — Inpatient Hospital Stay: Admission: RE | Admit: 2015-04-08 | Payer: Medicare Other | Source: Ambulatory Visit

## 2015-04-08 ENCOUNTER — Encounter (HOSPITAL_COMMUNITY): Payer: Medicare Other

## 2015-04-08 ENCOUNTER — Encounter (HOSPITAL_COMMUNITY): Payer: Self-pay | Admitting: Internal Medicine

## 2015-04-08 DIAGNOSIS — D649 Anemia, unspecified: Secondary | ICD-10-CM | POA: Diagnosis not present

## 2015-04-08 DIAGNOSIS — Z954 Presence of other heart-valve replacement: Secondary | ICD-10-CM

## 2015-04-08 DIAGNOSIS — Z6841 Body Mass Index (BMI) 40.0 and over, adult: Secondary | ICD-10-CM | POA: Diagnosis not present

## 2015-04-08 DIAGNOSIS — N1831 Chronic kidney disease, stage 3a: Secondary | ICD-10-CM | POA: Diagnosis present

## 2015-04-08 DIAGNOSIS — E781 Pure hyperglyceridemia: Secondary | ICD-10-CM | POA: Diagnosis present

## 2015-04-08 DIAGNOSIS — E559 Vitamin D deficiency, unspecified: Secondary | ICD-10-CM | POA: Diagnosis present

## 2015-04-08 DIAGNOSIS — R590 Localized enlarged lymph nodes: Secondary | ICD-10-CM | POA: Diagnosis present

## 2015-04-08 DIAGNOSIS — Z952 Presence of prosthetic heart valve: Secondary | ICD-10-CM

## 2015-04-08 DIAGNOSIS — I440 Atrioventricular block, first degree: Secondary | ICD-10-CM | POA: Diagnosis present

## 2015-04-08 DIAGNOSIS — N183 Chronic kidney disease, stage 3 unspecified: Secondary | ICD-10-CM | POA: Diagnosis present

## 2015-04-08 DIAGNOSIS — I129 Hypertensive chronic kidney disease with stage 1 through stage 4 chronic kidney disease, or unspecified chronic kidney disease: Secondary | ICD-10-CM | POA: Diagnosis present

## 2015-04-08 DIAGNOSIS — G4730 Sleep apnea, unspecified: Secondary | ICD-10-CM | POA: Diagnosis present

## 2015-04-08 DIAGNOSIS — J189 Pneumonia, unspecified organism: Secondary | ICD-10-CM | POA: Diagnosis not present

## 2015-04-08 DIAGNOSIS — Z7901 Long term (current) use of anticoagulants: Secondary | ICD-10-CM | POA: Diagnosis not present

## 2015-04-08 DIAGNOSIS — Z87891 Personal history of nicotine dependence: Secondary | ICD-10-CM | POA: Diagnosis not present

## 2015-04-08 DIAGNOSIS — I890 Lymphedema, not elsewhere classified: Secondary | ICD-10-CM | POA: Diagnosis present

## 2015-04-08 DIAGNOSIS — J449 Chronic obstructive pulmonary disease, unspecified: Secondary | ICD-10-CM | POA: Diagnosis not present

## 2015-04-08 DIAGNOSIS — I10 Essential (primary) hypertension: Secondary | ICD-10-CM | POA: Diagnosis not present

## 2015-04-08 DIAGNOSIS — R0600 Dyspnea, unspecified: Secondary | ICD-10-CM | POA: Diagnosis not present

## 2015-04-08 DIAGNOSIS — Z7982 Long term (current) use of aspirin: Secondary | ICD-10-CM | POA: Diagnosis not present

## 2015-04-08 DIAGNOSIS — R0609 Other forms of dyspnea: Secondary | ICD-10-CM | POA: Diagnosis not present

## 2015-04-08 DIAGNOSIS — F329 Major depressive disorder, single episode, unspecified: Secondary | ICD-10-CM | POA: Diagnosis present

## 2015-04-08 LAB — COMPREHENSIVE METABOLIC PANEL
ALBUMIN: 3.5 g/dL (ref 3.5–5.0)
ALK PHOS: 58 U/L (ref 38–126)
ALT: 15 U/L (ref 14–54)
AST: 23 U/L (ref 15–41)
Anion gap: 10 (ref 5–15)
BILIRUBIN TOTAL: 0.8 mg/dL (ref 0.3–1.2)
BUN: 21 mg/dL — AB (ref 6–20)
CO2: 27 mmol/L (ref 22–32)
Calcium: 9.1 mg/dL (ref 8.9–10.3)
Chloride: 100 mmol/L — ABNORMAL LOW (ref 101–111)
Creatinine, Ser: 1.31 mg/dL — ABNORMAL HIGH (ref 0.44–1.00)
GFR calc Af Amer: 52 mL/min — ABNORMAL LOW (ref >60)
GFR calc non Af Amer: 45 mL/min — ABNORMAL LOW (ref >60)
GLUCOSE: 99 mg/dL (ref 65–99)
POTASSIUM: 4.9 mmol/L (ref 3.5–5.1)
Sodium: 137 mmol/L (ref 135–145)
TOTAL PROTEIN: 6.6 g/dL (ref 6.5–8.1)

## 2015-04-08 LAB — URINALYSIS, ROUTINE W REFLEX MICROSCOPIC
Bilirubin Urine: NEGATIVE
Glucose, UA: NEGATIVE mg/dL
Hgb urine dipstick: NEGATIVE
Ketones, ur: NEGATIVE mg/dL
Leukocytes, UA: NEGATIVE
Nitrite: NEGATIVE
Protein, ur: NEGATIVE mg/dL
Specific Gravity, Urine: 1.027 (ref 1.005–1.030)
Urobilinogen, UA: 0.2 mg/dL (ref 0.0–1.0)
pH: 7.5 (ref 5.0–8.0)

## 2015-04-08 LAB — CBC WITH DIFFERENTIAL/PLATELET
BASOS PCT: 1 %
Basophils Absolute: 0.1 10*3/uL (ref 0.0–0.1)
EOS ABS: 0.2 10*3/uL (ref 0.0–0.7)
Eosinophils Relative: 3 %
HCT: 28.1 % — ABNORMAL LOW (ref 36.0–46.0)
Hemoglobin: 8.2 g/dL — ABNORMAL LOW (ref 12.0–15.0)
Lymphocytes Relative: 11 %
Lymphs Abs: 0.8 10*3/uL (ref 0.7–4.0)
MCH: 24.6 pg — AB (ref 26.0–34.0)
MCHC: 29.2 g/dL — ABNORMAL LOW (ref 30.0–36.0)
MCV: 84.4 fL (ref 78.0–100.0)
MONO ABS: 1.1 10*3/uL — AB (ref 0.1–1.0)
Monocytes Relative: 14 %
NEUTROS PCT: 71 %
Neutro Abs: 5.3 10*3/uL (ref 1.7–7.7)
PLATELETS: 285 10*3/uL (ref 150–400)
RBC: 3.33 MIL/uL — ABNORMAL LOW (ref 3.87–5.11)
RDW: 18.1 % — AB (ref 11.5–15.5)
WBC: 7.5 10*3/uL (ref 4.0–10.5)

## 2015-04-08 LAB — CBC
HCT: 30 % — ABNORMAL LOW (ref 36.0–46.0)
Hemoglobin: 8.6 g/dL — ABNORMAL LOW (ref 12.0–15.0)
MCH: 24.2 pg — AB (ref 26.0–34.0)
MCHC: 28.7 g/dL — AB (ref 30.0–36.0)
MCV: 84.5 fL (ref 78.0–100.0)
PLATELETS: 273 10*3/uL (ref 150–400)
RBC: 3.55 MIL/uL — ABNORMAL LOW (ref 3.87–5.11)
RDW: 17.6 % — AB (ref 11.5–15.5)
WBC: 6.5 10*3/uL (ref 4.0–10.5)

## 2015-04-08 LAB — PREPARE RBC (CROSSMATCH)

## 2015-04-08 LAB — TROPONIN I: Troponin I: <0.03 ng/mL

## 2015-04-08 LAB — PROTIME-INR
INR: 2.21 — ABNORMAL HIGH (ref 0.00–1.49)
PROTHROMBIN TIME: 24.4 s — AB (ref 11.6–15.2)

## 2015-04-08 MED ORDER — WARFARIN SODIUM 7.5 MG PO TABS: 7.5000 mg | ORAL_TABLET | ORAL | Status: DC

## 2015-04-08 MED ORDER — ACETAMINOPHEN 325 MG PO TABS: 650.0000 mg | ORAL_TABLET | Freq: Once | ORAL | Status: DC

## 2015-04-08 MED ORDER — DIPHENHYDRAMINE HCL 25 MG PO CAPS
50.0000 mg | ORAL_CAPSULE | Freq: Once | ORAL | Status: AC
Start: 2015-04-08 — End: 2015-04-08
  Administered 2015-04-08: 50 mg via ORAL
  Filled 2015-04-08: qty 2

## 2015-04-08 MED ORDER — PNEUMOCOCCAL VAC POLYVALENT 25 MCG/0.5ML IJ INJ
0.5000 mL | INJECTION | INTRAMUSCULAR | Status: AC
  Administered 2015-04-09: 0.5 mL via INTRAMUSCULAR
  Filled 2015-04-08: qty 0.5

## 2015-04-08 MED ORDER — SODIUM CHLORIDE 0.9 % IV SOLN: Freq: Once | INTRAVENOUS | Status: DC

## 2015-04-08 MED ORDER — WARFARIN - PHARMACIST DOSING INPATIENT: Freq: Every day | Status: DC

## 2015-04-08 MED ORDER — WARFARIN SODIUM 7.5 MG PO TABS
3.7500 mg | ORAL_TABLET | ORAL | Status: DC
  Administered 2015-04-08: 3.75 mg via ORAL
  Filled 2015-04-08: qty 2

## 2015-04-08 MED ORDER — DIPHENHYDRAMINE HCL 25 MG PO CAPS: 25.0000 mg | ORAL_CAPSULE | ORAL | Status: DC | PRN

## 2015-04-08 MED ORDER — DIPHENHYDRAMINE HCL 25 MG PO CAPS: 25.0000 mg | ORAL_CAPSULE | Freq: Once | ORAL | Status: DC

## 2015-04-08 MED ORDER — DEXTROSE 5 % IV SOLN
1.0000 g | Freq: Every day | INTRAVENOUS | Status: DC
  Administered 2015-04-08 – 2015-04-10 (×3): 1 g via INTRAVENOUS
  Filled 2015-04-08 (×3): qty 10

## 2015-04-08 MED ORDER — MONTELUKAST SODIUM 10 MG PO TABS
10.0000 mg | ORAL_TABLET | Freq: Every day | ORAL | Status: DC
  Administered 2015-04-08 – 2015-04-10 (×3): 10 mg via ORAL
  Filled 2015-04-08 (×3): qty 1

## 2015-04-08 MED ORDER — BUPROPION HCL ER (XL) 150 MG PO TB24
300.0000 mg | ORAL_TABLET | Freq: Every day | ORAL | Status: DC
  Administered 2015-04-08 – 2015-04-10 (×3): 300 mg via ORAL
  Filled 2015-04-08 (×3): qty 2

## 2015-04-08 MED ORDER — SODIUM CHLORIDE 0.9 % IV SOLN
10.0000 mL/h | Freq: Once | INTRAVENOUS | Status: AC
  Administered 2015-04-08: 10 mL/h via INTRAVENOUS

## 2015-04-08 MED ORDER — DEXTROSE 5 % IV SOLN
500.0000 mg | Freq: Every day | INTRAVENOUS | Status: DC
  Administered 2015-04-08 – 2015-04-09 (×2): 500 mg via INTRAVENOUS
  Filled 2015-04-08 (×2): qty 500

## 2015-04-08 MED ORDER — METOPROLOL TARTRATE 25 MG PO TABS
25.0000 mg | ORAL_TABLET | Freq: Two times a day (BID) | ORAL | Status: DC
  Administered 2015-04-08 – 2015-04-10 (×5): 25 mg via ORAL
  Filled 2015-04-08 (×5): qty 1

## 2015-04-08 MED ORDER — ASPIRIN EC 81 MG PO TBEC
81.0000 mg | DELAYED_RELEASE_TABLET | Freq: Every day | ORAL | Status: DC
  Administered 2015-04-08 – 2015-04-10 (×3): 81 mg via ORAL
  Filled 2015-04-08 (×3): qty 1

## 2015-04-08 MED ORDER — ALBUTEROL SULFATE (2.5 MG/3ML) 0.083% IN NEBU: 2.5000 mg | INHALATION_SOLUTION | RESPIRATORY_TRACT | Status: DC | PRN

## 2015-04-08 MED ORDER — PRAVASTATIN SODIUM 40 MG PO TABS
80.0000 mg | ORAL_TABLET | Freq: Every day | ORAL | Status: DC
  Administered 2015-04-08 – 2015-04-09 (×2): 80 mg via ORAL
  Filled 2015-04-08 (×3): qty 2

## 2015-04-08 MED ORDER — CITALOPRAM HYDROBROMIDE 20 MG PO TABS
20.0000 mg | ORAL_TABLET | Freq: Every day | ORAL | Status: DC
  Administered 2015-04-08 – 2015-04-10 (×3): 20 mg via ORAL
  Filled 2015-04-08 (×3): qty 1

## 2015-04-08 MED ORDER — BUDESONIDE-FORMOTEROL FUMARATE 80-4.5 MCG/ACT IN AERO
2.0000 | INHALATION_SPRAY | Freq: Two times a day (BID) | RESPIRATORY_TRACT | Status: DC
  Administered 2015-04-08 – 2015-04-10 (×5): 2 via RESPIRATORY_TRACT
  Filled 2015-04-08: qty 6.9

## 2015-04-08 MED ORDER — WARFARIN SODIUM 7.5 MG PO TABS
7.5000 mg | ORAL_TABLET | ORAL | Status: DC
  Administered 2015-04-08 – 2015-04-09 (×2): 7.5 mg via ORAL
  Filled 2015-04-08 (×2): qty 1

## 2015-04-08 NOTE — Progress Notes (Signed)
ANTICOAGULATION CONSULT NOTE - Follow up  Pharmacy Consult for Coumadin  Indication: AVR & MVR  No Known Allergies  Patient Measurements: Height: 5\' 6"  (167.6 cm) Weight: 295 lb 13.7 oz (134.2 kg) IBW/kg (Calculated) : 59.3 Heparin Dosing Weight: n/a   Vital Signs: Temp: 98.7 F (37.1 C) (11/08 1159) Temp Source: Oral (11/08 1159) BP: 105/59 mmHg (11/08 1159) Pulse Rate: 78 (11/08 1159)  Labs:  Recent Labs  04/07/15 1249 04/07/15 1951 04/08/15 0743  HGB 7.9* 7.6* 8.2*  HCT 27.4* 26.6* 28.1*  PLT 348 309 285  LABPROT  --  27.4* 24.4*  INR  --  2.59* 2.21*  CREATININE 1.42* 1.47* 1.31*  TROPONINI  --   --  <0.03    Estimated Creatinine Clearance: 68.4 mL/min (by C-G formula based on Cr of 1.31).   Medical History: Past Medical History  Diagnosis Date  . Allergic rhinitis   . Noninfectious lymphedema   . Undiagnosed cardiac murmurs   . Mitral stenosis   . Morbid obesity ([place])   . Depressive disorder   . Menopausal symptoms   . Mitral and aortic heart valve diseases, unspecified March 2016    s/p AVR with #19 St Jude and s/p MVR with 10mm St. Jude per [name] at [facility]  . Vitamin D deficiency   . HTN (hypertension)   . Hyperlipidemia   . CAO (chronic airflow obstruction) (HCC)   . Gout   . Hypertriglyceridemia   . Sleep apnea   . Kidney disease     Dr. Graylon Gunning  . Anxiety   . Arthritis   . Shortness of breath     with exertion  . Aortic stenosis   . Renal insufficiency     Medications:  Prescriptions prior to admission  Medication Sig Dispense Refill Last Dose  . acetaminophen (TYLENOL) 650 MG CR tablet Take 1,300 mg by mouth 2 (two) times daily as needed for pain.    04/07/2015 at Unknown time  . albuterol (PROVENTIL HFA;VENTOLIN HFA) 108 (90 BASE) MCG/ACT inhaler Inhale 2 puffs into the lungs 2 (two) times daily as needed for wheezing or shortness of breath.   unknokwn  . albuterol (PROVENTIL) (2.5 MG/3ML) 0.083% nebulizer solution Inhale 2.5 mg  into the lungs every 4 (four) hours as needed.   unknown  . aspirin EC 81 MG tablet Take 81 mg by mouth daily.   04/07/2015 at Unknown time  . budesonide-formoterol (SYMBICORT) 80-4.5 MCG/ACT inhaler Inhale 2 puffs into the lungs 2 (two) times daily.   04/07/2015 at Unknown time  . buPROPion (WELLBUTRIN XL) 300 MG 24 hr tablet Take 300 mg by mouth daily.    04/07/2015 at Unknown time  . Cholecalciferol (VITAMIN D3) 2000 UNITS TABS Take 2,000 Units by mouth daily.    04/07/2015 at Unknown time  . citalopram (CELEXA) 20 MG tablet Take 20 mg by mouth daily.    04/07/2015 at Unknown time  . fexofenadine (ALLEGRA) 180 MG tablet Take 180 mg by mouth daily.   04/07/2015 at Unknown time  . furosemide (LASIX) 40 MG tablet Take 40 mg by mouth 2 (two) times daily.   04/07/2015 at Unknown time  . metoprolol tartrate (LOPRESSOR) 25 MG tablet Take 25 mg by mouth 2 (two) times daily.    04/07/2015 at 0630  . montelukast (SINGULAIR) 10 MG tablet Take 10 mg by mouth daily.    04/07/2015 at Unknown time  . potassium chloride SA (K-DUR,KLOR-CON) 20 MEQ tablet Take 20 mEq by mouth  2 (two) times daily.   04/07/2015 at Unknown time  . pravastatin (PRAVACHOL) 80 MG tablet Take 80 mg by mouth daily.    04/07/2015 at Unknown time  . warfarin (COUMADIN) 7.5 MG tablet Take 3.75-7.5 mg by mouth See admin instructions. Take 1 tablet every day except take 1/2 tablet on Mondays   04/06/2015 at Unknown time  . NON FORMULARY Place 2.5 L into the nose continuous as needed (sob).    Taking  . warfarin (COUMADIN) 7.5 MG tablet TAKE AS DIRECTED BY ANTICOAGULATION CLINIC (Patient not taking: Reported on 04/07/2015) 30 tablet 3 Not Taking at Unknown time    Assessment: 58 YOF who presented with shortness of breath. Found to have symptomatic anemia and planning to transfuse 2 units. 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 Pharmacy consulted to resume home Coumadin therapy. INR on admission was therapeutic at 2.59 on  dose as noted below. H/H 7.6/26.6. Plt wnl. PTA last dose of Coumadin was on 11/6.  Today INR = 2.21, below goal of 2.5-3.5 for mechanical MVR & AVR.  Symptomatic anemia - -s/p 2 units , Hgb improved to 8.2,  pltc remains wnl-Hemoccult stools negative. No bleeding noted. Now on Azithromycin for CAP. Azithromycin may increase coumadin effect. I will continue with home dosage for now. F/u INR in AM.  Home Coumadin dose: 7.5 mg daily except 3.75 mg on Mondays   Goal of Therapy:  INR 2.5-3.5 (Per [facility] clinic notes) Monitor platelets by anticoagulation protocol: Yes   Plan:  Continue home Coumadin dose 7.5 mg daily except 3.75 mg on every  Monday. -Monitor daily PT/INR   Nicole Cella, RPh Clinical Pharmacist Pager: [phone] 04/08/2015 1:44 PM

## 2015-04-08 NOTE — Progress Notes (Signed)
Patient admitted after midnight, please see H&P. 54 yo F with hx of mechanical aortic and mitral valve on Coumadin history tonic asthmatic bronchitis and kidney disease patient have had dyspnea which has been worsening CT scan that showed mild bronchopneumonia as well as patient was found to be anemic with hemoglobin down to 7.6 likely symptomatic.  Essential hypertension - continue home medications   Valvular heart disease S/P AVR and S/P MVR (mitral valve repair) -echo  Symptomatic anemia -  -s/p 2 units  -Hemoccult stools negative  -unfort. Fe panel not done before transfusion  Chronic asthmatic bronchitis (HCC) currently does not appear to be wheezing  CAP (community acquired pneumonia) -  -abx   CKD (chronic kidney disease) stage 3, GFR 30-59 ml/min - chronic continue to monitor  [place]

## 2015-04-08 NOTE — H&P (Signed)
PCP:  [name], FNP  Cardiology [facility]  Referring provider  Alfonse Spruce   Chief Complaint:   HPI: [name] is a 53 y.o. female   has a past medical history of Allergic rhinitis; Noninfectious lymphedema; Undiagnosed cardiac murmurs; Mitral stenosis; Morbid obesity ([place]); Depressive disorder; Menopausal symptoms; Mitral and aortic heart valve diseases, unspecified (March 2016); Vitamin D deficiency; HTN (hypertension); Hyperlipidemia; CAO (chronic airflow obstruction) ([place]); Gout; Hypertriglyceridemia; Sleep apnea; Kidney disease; Anxiety; Arthritis; Shortness of breath; Aortic stenosis; and Renal insufficiency.   Presented with worsening shortness of breath. Patient is being followed by cardiology she status post mechanical MVR as well as mechanical AVR at [facility] in March 2016 on Coumadin. Patient is known history of a flatter patient has required in the past cardioversion. Patient have had some persistent shortness of breath as has been also followed up by pulmonology she has persistent hypoxemia requires oxygen and home up to 3 L. Reports while at the doctors office her oxygenation had dropped into 70's  on ambulation Patient presented today again to cardiology due to ongoing shortness of breath and increasing weight. Blood work was done in the office that showed hemoglobin was 7.9 from baseline of 9. Patient was instructed to present to emergency department. In ER patient was found to be Hemoccult negative from below repeat hemoglobin was 7.6 anemia panel has been ordered. She denies any fever no chest pain. She reports no cough CT of the chest was done showing no evidence of PE but possibly mild bronchopneumonia  Hospitalist was called for admission for symptomatic anemia  Review of Systems:    Pertinent positives include: Dyspnea no exertion or shortness of breath  Constitutional:  No weight loss, night sweats, Fevers, chills, fatigue, weight loss    HEENT:  No headaches, Difficulty swallowing,Tooth/dental problems,Sore throat,  No sneezing, itching, ear ache, nasal congestion, post nasal drip,  Cardio-vascular:  No chest pain, Orthopnea, PND, anasarca, dizziness, palpitations.no Bilateral lower extremity swelling  GI:  No heartburn, indigestion, abdominal pain, nausea, vomiting, diarrhea, change in bowel habits, loss of appetite, melena, blood in stool, hematemesis Resp:   No excess mucus, no productive cough, No non-productive cough, No coughing up of blood.No change in color of mucus.No wheezing. Skin:  no rash or lesions. No jaundice GU:  no dysuria, change in color of urine, no urgency or frequency. No straining to urinate.  No flank pain.  Musculoskeletal:  No joint pain or no joint swelling. No decreased range of motion. No back pain.  Psych:  No change in mood or affect. No depression or anxiety. No memory loss.  Neuro: no localizing neurological complaints, no tingling, no weakness, no double vision, no gait abnormality, no slurred speech, no confusion  Otherwise ROS are negative except for above, 10 systems were reviewed  Past Medical History: Past Medical History  Diagnosis Date  . Allergic rhinitis   . Noninfectious lymphedema   . Undiagnosed cardiac murmurs   . Mitral stenosis   . Morbid obesity ([place])   . Depressive disorder   . Menopausal symptoms   . 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 Jude per [name] at [facility]  . Vitamin D deficiency   . HTN (hypertension)   . Hyperlipidemia   . CAO (chronic airflow obstruction) (HCC)   . Gout   . Hypertriglyceridemia   . Sleep apnea   . Kidney disease  Dr. Graylon Gunning  . Anxiety   . Arthritis   . Shortness of breath     with exertion  . Aortic stenosis   . Renal insufficiency    Past Surgical History  Procedure Laterality Date  . Video bronchoscopy Bilateral 10/25/2012     Procedure: VIDEO BRONCHOSCOPY WITH FLUORO;  Surgeon: Kathee Delton, MD;  Location: [facility];  Service: Cardiopulmonary;  Laterality: Bilateral;  . Tonsillectomy    . Video assisted thoracoscopy Left 10/03/2013    Procedure: Left Video Assited Thoracoscopy;  Surgeon: Melrose Nakayama, MD;  Location: [place];  Service: Thoracic;  Laterality: Left;  . Lung biopsy Left 10/03/2013    Procedure: Left Lung Biopsy;  Surgeon: Melrose Nakayama, MD;  Location: [place];  Service: Thoracic;  Laterality: Left;  . Cardioversion N/A 09/19/2014    Procedure: CARDIOVERSION;  Surgeon: Sanda Klein, MD;  Location: [facility];  Service: Cardiovascular;  Laterality: N/A;     Medications: Prior to Admission medications   Medication Sig Start Date End Date Taking? Authorizing Provider  acetaminophen (TYLENOL) 650 MG CR tablet Take 1,300 mg by mouth 2 (two) times daily as needed for pain.    Yes Historical Provider, MD  albuterol (PROVENTIL HFA;VENTOLIN HFA) 108 (90 BASE) MCG/ACT inhaler Inhale 2 puffs into the lungs 2 (two) times daily as needed for wheezing or shortness of breath.   Yes Historical Provider, MD  albuterol (PROVENTIL) (2.5 MG/3ML) 0.083% nebulizer solution Inhale 2.5 mg into the lungs every 4 (four) hours as needed. 03/19/15 03/18/16 Yes Historical Provider, MD  aspirin EC 81 MG tablet Take 81 mg by mouth daily.   Yes Historical Provider, MD  budesonide-formoterol (SYMBICORT) 80-4.5 MCG/ACT inhaler Inhale 2 puffs into the lungs 2 (two) times daily. 03/19/15 03/18/16 Yes Historical Provider, MD  buPROPion (WELLBUTRIN XL) 300 MG 24 hr tablet Take 300 mg by mouth daily.  08/01/12  Yes Historical Provider, MD  Cholecalciferol (VITAMIN D3) 2000 UNITS TABS Take 2,000 Units by mouth daily.    Yes Historical Provider, MD  citalopram (CELEXA) 20 MG tablet Take 20 mg by mouth daily.  08/01/12  Yes Historical Provider, MD  fexofenadine (ALLEGRA) 180 MG tablet Take 180 mg by mouth daily.   Yes Historical  Provider, MD  furosemide (LASIX) 40 MG tablet Take 40 mg by mouth 2 (two) times daily.   Yes Historical Provider, MD  metoprolol tartrate (LOPRESSOR) 25 MG tablet Take 25 mg by mouth 2 (two) times daily.    Yes Historical Provider, MD  montelukast (SINGULAIR) 10 MG tablet Take 10 mg by mouth daily.    Yes Historical Provider, MD  potassium chloride SA (K-DUR,KLOR-CON) 20 MEQ tablet Take 20 mEq by mouth 2 (two) times daily.   Yes Historical Provider, MD  pravastatin (PRAVACHOL) 80 MG tablet Take 80 mg by mouth daily.  08/01/12  Yes Historical Provider, MD  warfarin (COUMADIN) 7.5 MG tablet Take 3.75-7.5 mg by mouth See admin instructions. Take 1 tablet every day except take 1/2 tablet on Mondays   Yes Historical Provider, MD  NON FORMULARY Place 2.5 L into the nose continuous as needed (sob).     Historical Provider, MD  warfarin (COUMADIN) 7.5 MG tablet TAKE AS DIRECTED BY ANTICOAGULATION CLINIC Patient not taking: Reported on 04/07/2015 03/28/15   Lorretta Harp, MD    Allergies:  No Known Allergies  Social History:  Ambulatory  independently   Lives at home With family     reports that she quit  smoking about 18 months ago. Her smoking use included Cigarettes. She has a 35 pack-year smoking history. She has never used smokeless tobacco. She reports that she does not drink alcohol or use illicit drugs.    Family History: family history includes Cancer in her mother; Dementia in her mother; Diabetes in her brother; Emphysema in her mother; Heart disease in her father; Hypertension in her brother, father, mother, and sister; Kidney disease in her father; Kidney failure in her father; Stroke in her brother. There is no history of Heart attack.    Physical Exam: Patient Vitals for the past 24 hrs:  BP Temp Temp src Pulse Resp SpO2  04/08/15 0124 (!) 109/44 mmHg 98.1 F (36.7 C) Oral 63 18 97 %  04/08/15 0108 - - - 63 16 98 %  04/08/15 0107 (!) 104/47 mmHg - - 64 18 98 %  04/08/15 0106 -  - - 65 18 98 %  04/08/15 0105 (!) 104/47 mmHg 98.1 F (36.7 C) Oral 65 19 98 %  04/08/15 0105 - - - 66 16 100 %  04/08/15 0045 - - - 67 16 99 %  04/08/15 0044 - - - 67 17 100 %  04/08/15 0043 - - - 68 19 100 %  04/08/15 0042 - - - 67 20 100 %  04/08/15 0041 - - - 69 15 100 %  04/08/15 0040 - - - 65 16 95 %  04/08/15 0039 - - - 66 16 98 %  04/08/15 0038 - - - 67 23 99 %  04/08/15 0037 - - - 67 20 99 %  04/08/15 0036 - - - 69 16 98 %  04/08/15 0030 (!) 106/37 mmHg - - 70 22 95 %  04/08/15 0015 - - - 66 25 99 %  04/08/15 0000 (!) 104/48 mmHg - - 68 18 94 %  04/07/15 2221 115/76 mmHg - - 63 20 98 %  04/07/15 2130 97/65 mmHg - - 67 17 96 %  04/07/15 2127 (!) 110/50 mmHg - - 67 16 97 %  04/07/15 2125 - - - - - (!) 75 %  04/07/15 1852 124/61 mmHg 99 F (37.2 C) Oral 86 20 98 %    1. General:  in No Acute distress 2. Psychological: Alert and   Oriented 3. Head/ENT:   Moist  Mucous Membranes                          Head Non traumatic, neck supple                          Normal     Dentition 4. SKIN: decreased Skin turgor,  Skin clean Dry and intact no rash 5. Heart: Regular rate and rhythm no Murmur, Rub or gallop 6. Lungs:  no wheezes mild crackles   7. Abdomen: Soft, non-tender, Non distended 8. Lower extremities: no clubbing, cyanosis, or edema 9. Neurologically Grossly intact, moving all 4 extremities equally 10. MSK: Normal range of motion  body mass index is unknown because there is no weight on file.   Labs on Admission:   Results for orders placed or performed during the hospital encounter of 04/07/15 (from the past 24 hour(s))  Type and screen [place]     Status: None (Preliminary result)   Collection Time: 04/07/15  7:19 PM  Result Value Ref Range   ABO/RH(D) O POS  Antibody Screen NEG    Sample Expiration 04/10/2015    Unit Number [id number]    Blood Component Type RED CELLS,LR    Unit division 00    Status of Unit ISSUED     Transfusion Status OK TO TRANSFUSE    Crossmatch Result Compatible   Comprehensive metabolic panel     Status: Abnormal   Collection Time: 04/07/15  7:51 PM  Result Value Ref Range   Sodium 136 135 - 145 mmol/L   Potassium 4.6 3.5 - 5.1 mmol/L   Chloride 98 (L) 101 - 111 mmol/L   CO2 29 22 - 32 mmol/L   Glucose, Bld 70 65 - 99 mg/dL   BUN 28 (H) 6 - 20 mg/dL   Creatinine, Ser 1.47 (H) 0.44 - 1.00 mg/dL   Calcium 9.2 8.9 - 10.3 mg/dL   Total Protein 6.8 6.5 - 8.1 g/dL   Albumin 3.7 3.5 - 5.0 g/dL   AST 27 15 - 41 U/L   ALT 17 14 - 54 U/L   Alkaline Phosphatase 67 38 - 126 U/L   Total Bilirubin 0.9 0.3 - 1.2 mg/dL   GFR calc non Af Amer 39 (L) >60 mL/min   GFR calc Af Amer 45 (L) >60 mL/min   Anion gap 9 5 - 15  CBC     Status: Abnormal   Collection Time: 04/07/15  7:51 PM  Result Value Ref Range   WBC 7.7 4.0 - 10.5 K/uL   RBC 3.14 (L) 3.87 - 5.11 MIL/uL   Hemoglobin 7.6 (L) 12.0 - 15.0 g/dL   HCT 26.6 (L) 36.0 - 46.0 %   MCV 84.7 78.0 - 100.0 fL   MCH 24.2 (L) 26.0 - 34.0 pg   MCHC 28.6 (L) 30.0 - 36.0 g/dL   RDW 18.4 (H) 11.5 - 15.5 %   Platelets 309 150 - 400 K/uL  Protime-INR - (order if Patient is taking Coumadin / Warfarin)     Status: Abnormal   Collection Time: 04/07/15  7:51 PM  Result Value Ref Range   Prothrombin Time 27.4 (H) 11.6 - 15.2 seconds   INR 2.59 (H) 0.00 - 1.49  POC occult blood, ED     Status: None   Collection Time: 04/07/15 11:47 PM  Result Value Ref Range   Fecal Occult Bld NEGATIVE NEGATIVE  Prepare RBC     Status: None   Collection Time: 04/08/15 12:30 AM  Result Value Ref Range   Order Confirmation ORDER PROCESSED BY BLOOD BANK     UA odered  No results found for: HGBA1C  Estimated Creatinine Clearance: 61.7 mL/min (by C-G formula based on Cr of 1.47).  BNP (last 3 results)  Recent Labs  09/23/14 1149  PROBNP 343.0*    Other results:  I have pearsonaly reviewed this: ECG REPORT  Rate: 58  Rhythm: SR with 1st AV  block ST&T Change: no ischemic changes QTC wnl  There were no vitals filed for this visit.   Cultures:    Component Value Date/Time   SDES BRONCHIAL ALVEOLAR LAVAGE 10/25/2012 0821   SDES BRONCHIAL ALVEOLAR LAVAGE 10/25/2012 0821   SDES BRONCHIAL ALVEOLAR LAVAGE 10/25/2012 0821   SPECREQUEST Normal 10/25/2012 0821   SPECREQUEST Normal 10/25/2012 0821   SPECREQUEST Normal 10/25/2012 0821   CULT NO ACID FAST BACILLI ISOLATED IN 6 WEEKS 10/25/2012 0821   CULT  10/25/2012 0821    HAEMOPHILUS INFLUENZAE Note: BETA LACTAMASE NEGATIVE   CULT No Fungi Isolated in 4 Weeks 10/25/2012  [id number]   REPTSTATUS 12/07/2012 FINAL 10/25/2012 0821   REPTSTATUS 10/27/2012 FINAL 10/25/2012 0821   REPTSTATUS 11/22/2012 FINAL 10/25/2012 0821     Radiological Exams on Admission: Dg Chest 2 View  04/07/2015  CLINICAL DATA:  3-4 week history of shortness of breath. EXAM: CHEST  2 VIEW COMPARISON:  02/18/2015. FINDINGS: The heart is enlarged but stable. Stable surgical changes from valve replacement surgery. There is vascular congestion and probable mild interstitial edema. No definite pleural effusions or focal infiltrates. The bony thorax is intact. IMPRESSION: Cardiac enlargement with vascular congestion and mild interstitial edema. No definite effusions or infiltrates. Electronically Signed   By: Marijo Sanes M.D.   On: 04/07/2015 22:58   Ct Angio Chest Pe W/cm &/or Wo Cm  04/07/2015  CLINICAL DATA:  Three-week history of shortness of breath. EXAM: CT ANGIOGRAPHY CHEST WITH CONTRAST TECHNIQUE: Multidetector CT imaging of the chest was performed using the standard protocol during bolus administration of intravenous contrast. Multiplanar CT image reconstructions and MIPs were obtained to evaluate the vascular anatomy. CONTRAST:  83mL OMNIPAQUE IOHEXOL 350 MG/ML SOLN COMPARISON:  Chest CT 12/12/2013 FINDINGS: Mediastinum/Nodes: No breast masses, supraclavicular or axillary lymphadenopathy. Small scattered lymph  nodes are noted. The thyroid gland appears normal. Mild cardiac enlargement but no pericardial effusion. Surgical changes since the prior study with median sternotomy wires and a prosthetic aortic can mitral valves. Progressive mediastinal and hilar lymphadenopathy. The largest node measures 28 mm in the pretracheal area on image number 49. PET-CT may be helpful for further evaluation to exclude pathologic adenopathy. The aorta is normal in caliber. No dissection. Coronary artery calcifications are noted. The pulmonary arterial tree is well opacified. No filling defects to suggest pulmonary embolism. The pulmonary arteries are enlarged suggesting pulmonary hypertension. Lungs/Pleura: Diffuse mosaic pattern of attenuation in the lungs. This can be seen with pulmonary hypertension or small airways disease such as constrictive bronchiolitis, reactive airways disease/asthma, cryptogenic organizing pneumonia or hypersensitivity pneumonitis. More focal airspace consolidation in the lingula may suggest early bronchopneumonia. No pleural effusions or pulmonary edema. Stable surgical scarring changes in the left lower lobe. Upper abdomen: Suspect cirrhotic changes involving the liver. Musculoskeletal: No significant bony findings. There are median sternotomy wires in place but the sternum is ununited. Review of the MIP images confirms the above findings. IMPRESSION: 1. No CT findings for pulmonary embolism. 2. Normal thoracic aorta. 3. Stable postoperative changes from aortic and mitral valve replacement surgery. The sternotomy is ununited. 4. Progressive mediastinal and hilar lymphadenopathy. Could not exclude a lymphoproliferative process. PET-CT may be helpful for further evaluation. 5. Enlarged pulmonary artery suggesting pulmonary hypertension. 6. Mosaic pattern of lung attenuation could be due to pulmonary hypertension, small airways disease such as constrictive bronchiolitis, reactive airways disease, cryptogenic  organizing pneumonia or hypersensitivity pneumonitis. 7. Patchy airspace consolidation in the lingula may suggest early bronchopneumonia. 8. Suspect cirrhotic changes involving the liver. Electronically Signed   By: Marijo Sanes M.D.   On: 04/07/2015 23:26    Chart has been reviewed  Family not  at  Bedside  plan of care was discussed with sister Blanchie Serve  [phone]  Assessment/Plan  54 yo F with hx of mechanical aortic and mitral valve on Coumadin history tonic asthmatic bronchitis and kidney disease patient have had dyspnea which has been worsening CT scan that showed mild bronchopneumonia as well as patient was found to be anemic with hemoglobin down to 7.6 likely symptomatic   Present on Admission:  . Essential hypertension  -  continue home medications  . Symptomatic anemia - we'll transfuse 2 units obtain Hemoccult stools first almost negative but is possible the patient has chronic GI blood loss obtain anemia panel  . Chronic asthmatic bronchitis (HCC) currently does not appear to be wheezing but mild crackles noted  . CAP (community acquired pneumonia) -   will admit for treatment of CAP will start on appropriate antibiotic coverage.   Obtain sputum cultures, blood cultures if febrile or if decompensates.  Provide oxygen as needed.  . CKD (chronic kidney disease) stage 3, GFR 30-59 ml/min - chronic continue to monitor   Prophylaxis: on coumadin  CODE STATUS:  FULL CODE   as per patient    Disposition:  To home once workup is complete and patient is stable  Other plan as per orders.  I have spent a total of 55 min on this admission  Gwendolyn Nishi 04/08/2015, 1:55 AM  [facility]  Pager [phone]   after 2 AM please page floor coverage PA If 7AM-7PM, please contact the day team taking care of the patient  [email or web address]  Password [email or web address]

## 2015-04-08 NOTE — Progress Notes (Signed)
New Admission Note:  Arrival Method: Via stretcher with nurse Tech Mental Orientation: Alert&oriented x4 Telemetry: placed on box  6, CCMD notified Assessment: Completed Skin: redness and swelling of right leg IV: left forearm, blood transfusing upon arrival Pain: denies any pain Admission: Completed [address] [place] Orientation: Patient has been orientated to the room, unit and the staff. Family: Sister at bedside  Orders have been reviewed and implemented. Will continue to monitor the patient. Call light has been placed within reach and bed alarm has been activated.   Leandro Reasoner BSN, RN  Phone Number: [phone] [facility]

## 2015-04-08 NOTE — Progress Notes (Signed)
ANTICOAGULATION CONSULT NOTE - Initial Consult  Pharmacy Consult for Coumadin  Indication: AVR & MVR  No Known Allergies  Patient Measurements:   Heparin Dosing Weight: n/a   Vital Signs: Temp: 98.1 F (36.7 C) (11/08 0124) Temp Source: Oral (11/08 0124) BP: 119/71 mmHg (11/08 0200) Pulse Rate: 62 (11/08 0230)  Labs:  Recent Labs  04/07/15 1249 04/07/15 1951  HGB 7.9* 7.6*  HCT 27.4* 26.6*  PLT 348 309  LABPROT  --  27.4*  INR  --  2.59*  CREATININE 1.42* 1.47*    Estimated Creatinine Clearance: 61.7 mL/min (by C-G formula based on Cr of 1.47).   Medical History: Past Medical History  Diagnosis Date  . Allergic rhinitis   . Noninfectious lymphedema   . Undiagnosed cardiac murmurs   . Mitral stenosis   . Morbid obesity ([place])   . Depressive disorder   . Menopausal symptoms   . 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 Jude per [name] at [facility]  . Vitamin D deficiency   . HTN (hypertension)   . Hyperlipidemia   . CAO (chronic airflow obstruction) (HCC)   . Gout   . Hypertriglyceridemia   . Sleep apnea   . Kidney disease     Dr. Graylon Gunning  . Anxiety   . Arthritis   . Shortness of breath     with exertion  . Aortic stenosis   . Renal insufficiency     Medications:  Prescriptions prior to admission  Medication Sig Dispense Refill Last Dose  . acetaminophen (TYLENOL) 650 MG CR tablet Take 1,300 mg by mouth 2 (two) times daily as needed for pain.    04/07/2015 at Unknown time  . albuterol (PROVENTIL HFA;VENTOLIN HFA) 108 (90 BASE) MCG/ACT inhaler Inhale 2 puffs into the lungs 2 (two) times daily as needed for wheezing or shortness of breath.   unknokwn  . albuterol (PROVENTIL) (2.5 MG/3ML) 0.083% nebulizer solution Inhale 2.5 mg into the lungs every 4 (four) hours as needed.   unknown  . aspirin EC 81 MG tablet Take 81 mg by mouth daily.   04/07/2015 at Unknown time  .  budesonide-formoterol (SYMBICORT) 80-4.5 MCG/ACT inhaler Inhale 2 puffs into the lungs 2 (two) times daily.   04/07/2015 at Unknown time  . buPROPion (WELLBUTRIN XL) 300 MG 24 hr tablet Take 300 mg by mouth daily.    04/07/2015 at Unknown time  . Cholecalciferol (VITAMIN D3) 2000 UNITS TABS Take 2,000 Units by mouth daily.    04/07/2015 at Unknown time  . citalopram (CELEXA) 20 MG tablet Take 20 mg by mouth daily.    04/07/2015 at Unknown time  . fexofenadine (ALLEGRA) 180 MG tablet Take 180 mg by mouth daily.   04/07/2015 at Unknown time  . furosemide (LASIX) 40 MG tablet Take 40 mg by mouth 2 (two) times daily.   04/07/2015 at Unknown time  . metoprolol tartrate (LOPRESSOR) 25 MG tablet Take 25 mg by mouth 2 (two) times daily.    04/07/2015 at 0630  . montelukast (SINGULAIR) 10 MG tablet Take 10 mg by mouth daily.    04/07/2015 at Unknown time  . potassium chloride SA (K-DUR,KLOR-CON) 20 MEQ tablet Take 20 mEq by mouth 2 (two) times daily.   04/07/2015 at Unknown time  . pravastatin (PRAVACHOL) 80 MG tablet Take 80 mg by mouth daily.    04/07/2015 at Unknown time  .  warfarin (COUMADIN) 7.5 MG tablet Take 3.75-7.5 mg by mouth See admin instructions. Take 1 tablet every day except take 1/2 tablet on Mondays   04/06/2015 at Unknown time  . NON FORMULARY Place 2.5 L into the nose continuous as needed (sob).    Taking  . warfarin (COUMADIN) 7.5 MG tablet TAKE AS DIRECTED BY ANTICOAGULATION CLINIC (Patient not taking: Reported on 04/07/2015) 30 tablet 3 Not Taking at Unknown time    Assessment: 78 YOF who presented with shortness of breath. Found to have symptomatic anemia and planning to transfuse 2 units. 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 Pharmacy consulted to resume home Coumadin therapy. INR on admission is therapeutic at 2.59. H/H 7.6/26.6. Plt wnl. Last dose of Coumadin was on 11/6  Home Coumadin dose: 7.5 mg daily except 3.75 mg on Mondays   Goal of Therapy:  INR  2.5-3.5 (Per [facility] clinic notes) Monitor platelets by anticoagulation protocol: Yes   Plan:  -Resume home Coumadin dose -Monitor daily PT/INR   Albertina Parr, PharmD., BCPS Clinical Pharmacist

## 2015-04-08 NOTE — Progress Notes (Signed)
EKG this morning for patient was sinus rhythm with 1st degree block. MD notified.

## 2015-04-08 NOTE — Progress Notes (Signed)
Paged NP on call to verify patient's  Blood order. Patient had 2 orders (one to transfuse 1 unit and the other to transfuse 2 units). Awaiting verification.

## 2015-04-08 NOTE — ED Provider Notes (Signed)
CSN: [id number]     Arrival date & time 04/07/15  1845 History   First MD Initiated Contact with Patient 04/07/15 2104     Chief Complaint  Patient presents with  . low hgb      (Consider location/radiation/quality/duration/timing/severity/associated sxs/prior Treatment) HPI Comments: 54 y.o. Female with history of HTN, CKD, recent valve replacement on Coumadin presents for low hemoglobin.  The patient reports that for a couple of months after her valve replacements, done at [facility], she was doing very well and that she has been attending cardiac rehab.  Over the last few weeks she has developed shortness of breath with exertion that has been progressively worsening.  She went to her pulmonologist doctor's office who said that it was just COPD.  She has been requiring increasing amounts of supplemental oxygen over this time despite medications prescribed at that visit.  She denies increased cough, sputum production, or chills. She saw her cardiology office for this issue today and was sent for lab work and had a CT chest ordered for tomorrow.  She was then called tonight and informed to come to the ER because her Hb was low.  She denies abdominal pain, blood in her stool, melanotic or black colored stools.     Past Medical History  Diagnosis Date  . Allergic rhinitis   . Noninfectious lymphedema   . Undiagnosed cardiac murmurs   . Mitral stenosis   . Morbid obesity ([place])   . Depressive disorder   . Menopausal symptoms   . Mitral and aortic heart valve diseases, unspecified March 2016    s/p AVR with #19 St Jude and s/p MVR with 69mm St. Jude per [name] at [facility]  . Vitamin D deficiency   . HTN (hypertension)   . Hyperlipidemia   . CAO (chronic airflow obstruction) (HCC)   . Gout   . Hypertriglyceridemia   . Sleep apnea   . Kidney disease     Dr. Graylon Gunning  . Anxiety   . Arthritis   . Shortness of breath     with exertion  . Aortic stenosis   . Renal insufficiency    Past Surgical  History  Procedure Laterality Date  . Video bronchoscopy Bilateral 10/25/2012    Procedure: VIDEO BRONCHOSCOPY WITH FLUORO;  Surgeon: Kathee Delton, MD;  Location: [facility];  Service: Cardiopulmonary;  Laterality: Bilateral;  . Tonsillectomy    . Video assisted thoracoscopy Left 10/03/2013    Procedure: Left Video Assited Thoracoscopy;  Surgeon: Melrose Nakayama, MD;  Location: [place];  Service: Thoracic;  Laterality: Left;  . Lung biopsy Left 10/03/2013    Procedure: Left Lung Biopsy;  Surgeon: Melrose Nakayama, MD;  Location: [place];  Service: Thoracic;  Laterality: Left;  . Cardioversion N/A 09/19/2014    Procedure: CARDIOVERSION;  Surgeon: Sanda Klein, MD;  Location: [facility];  Service: Cardiovascular;  Laterality: N/A;   Family History  Problem Relation Age of Onset  . Emphysema Mother   . Cancer Mother     throat  . Hypertension Mother   . Dementia Mother   . Heart disease Father     valve replacement  . Kidney disease Father   . Hypertension Father   . Kidney failure Father     dialysis  . Heart attack Neg Hx   . Hypertension Sister   . Hypertension Brother   . Diabetes Brother   . Stroke Brother    Social History  Substance Use Topics  .  Smoking status: Former Smoker -- 1.00 packs/day for 35 years    Types: Cigarettes    Quit date: 10/03/2013  . Smokeless tobacco: Never Used  . Alcohol Use: No   OB History    No data available     Review of Systems  Constitutional: Negative for fever, chills, appetite change and fatigue.  HENT: Negative for congestion, postnasal drip and rhinorrhea.   Eyes: Negative for pain and visual disturbance.  Respiratory: Positive for shortness of breath. Negative for cough, chest tightness and wheezing.   Cardiovascular: Negative for chest pain and palpitations.  Gastrointestinal: Negative for nausea, vomiting, abdominal pain, diarrhea, blood in stool and anal bleeding.  Genitourinary: Negative for dysuria, urgency and  hematuria.  Musculoskeletal: Negative for myalgias and back pain.  Skin: Positive for pallor. Negative for rash.  Neurological: Negative for dizziness, weakness, light-headedness and headaches.  Hematological: Bruises/bleeds easily.      Allergies  Review of patient's allergies indicates no known allergies.  Home Medications   Prior to Admission medications   Medication Sig Start Date End Date Taking? Authorizing Provider  acetaminophen (TYLENOL) 650 MG CR tablet Take 1,300 mg by mouth 2 (two) times daily as needed for pain.    Yes Historical Provider, MD  albuterol (PROVENTIL HFA;VENTOLIN HFA) 108 (90 BASE) MCG/ACT inhaler Inhale 2 puffs into the lungs 2 (two) times daily as needed for wheezing or shortness of breath.   Yes Historical Provider, MD  albuterol (PROVENTIL) (2.5 MG/3ML) 0.083% nebulizer solution Inhale 2.5 mg into the lungs every 4 (four) hours as needed. 03/19/15 03/18/16 Yes Historical Provider, MD  aspirin EC 81 MG tablet Take 81 mg by mouth daily.   Yes Historical Provider, MD  budesonide-formoterol (SYMBICORT) 80-4.5 MCG/ACT inhaler Inhale 2 puffs into the lungs 2 (two) times daily. 03/19/15 03/18/16 Yes Historical Provider, MD  buPROPion (WELLBUTRIN XL) 300 MG 24 hr tablet Take 300 mg by mouth daily.  08/01/12  Yes Historical Provider, MD  Cholecalciferol (VITAMIN D3) 2000 UNITS TABS Take 2,000 Units by mouth daily.    Yes Historical Provider, MD  citalopram (CELEXA) 20 MG tablet Take 20 mg by mouth daily.  08/01/12  Yes Historical Provider, MD  fexofenadine (ALLEGRA) 180 MG tablet Take 180 mg by mouth daily.   Yes Historical Provider, MD  furosemide (LASIX) 40 MG tablet Take 40 mg by mouth 2 (two) times daily.   Yes Historical Provider, MD  metoprolol tartrate (LOPRESSOR) 25 MG tablet Take 25 mg by mouth 2 (two) times daily.    Yes Historical Provider, MD  montelukast (SINGULAIR) 10 MG tablet Take 10 mg by mouth daily.    Yes Historical Provider, MD  potassium chloride SA  (K-DUR,KLOR-CON) 20 MEQ tablet Take 20 mEq by mouth 2 (two) times daily.   Yes Historical Provider, MD  pravastatin (PRAVACHOL) 80 MG tablet Take 80 mg by mouth daily.  08/01/12  Yes Historical Provider, MD  warfarin (COUMADIN) 7.5 MG tablet Take 3.75-7.5 mg by mouth See admin instructions. Take 1 tablet every day except take 1/2 tablet on Mondays   Yes Historical Provider, MD  NON FORMULARY Place 2.5 L into the nose continuous as needed (sob).     Historical Provider, MD  warfarin (COUMADIN) 7.5 MG tablet TAKE AS DIRECTED BY ANTICOAGULATION CLINIC Patient not taking: Reported on 04/07/2015 03/28/15   Lorretta Harp, MD   BP 108/36 mmHg  Pulse 59  Temp(Src) 98.3 F (36.8 C) (Oral)  Resp 18  Ht 5\' 6"  (1.676 m)  Wt 295 lb 13.7 oz (134.2 kg)  BMI 47.78 kg/m2  SpO2 96% Physical Exam  Constitutional: She is oriented to person, place, and time. She appears well-developed and well-nourished. No distress.  HENT:  Head: Normocephalic and atraumatic.  Right Ear: External ear normal.  Left Ear: External ear normal.  Nose: Nose normal.  Mouth/Throat: Oropharynx is clear and moist. No oropharyngeal exudate.  Eyes: EOM are normal. Pupils are equal, round, and reactive to light.  Neck: Normal range of motion. Neck supple.  Cardiovascular: Normal rate, regular rhythm and intact distal pulses.   Murmur heard. Pulmonary/Chest: Effort normal. No respiratory distress. She has no wheezes. She has no rales.  Abdominal: Soft. She exhibits no distension. There is no tenderness.  Musculoskeletal: Normal range of motion. She exhibits no edema or tenderness.  Neurological: She is alert and oriented to person, place, and time.  Skin: Skin is warm and dry. No rash noted. She is not diaphoretic. There is pallor.  Vitals reviewed.   ED Course  Procedures (including critical care time) Labs Review Labs Reviewed  COMPREHENSIVE METABOLIC PANEL - Abnormal; Notable for the following:    Chloride 98 (*)    BUN  28 (*)    Creatinine, Ser 1.47 (*)    GFR calc non Af Amer 39 (*)    GFR calc Af Amer 45 (*)    All other components within normal limits  CBC - Abnormal; Notable for the following:    RBC 3.14 (*)    Hemoglobin 7.6 (*)    HCT 26.6 (*)    MCH 24.2 (*)    MCHC 28.6 (*)    RDW 18.4 (*)    All other components within normal limits  PROTIME-INR - Abnormal; Notable for the following:    Prothrombin Time 27.4 (*)    INR 2.59 (*)    All other components within normal limits  CULTURE, EXPECTORATED SPUTUM-ASSESSMENT  GRAM STAIN  URINALYSIS, ROUTINE W REFLEX MICROSCOPIC (NOT AT [facility])  TROPONIN I  TROPONIN I  LEGIONELLA PNEUMOPHILA SEROGP 1 UR AG  STREP PNEUMONIAE URINARY ANTIGEN  COMPREHENSIVE METABOLIC PANEL  CBC WITH DIFFERENTIAL/PLATELET  PROTIME-INR  LEGIONELLA PNEUMOPHILA SEROGP 1 UR AG  POC OCCULT BLOOD, ED  TYPE AND SCREEN  PREPARE RBC (CROSSMATCH)  PREPARE RBC (CROSSMATCH)  PREPARE RBC (CROSSMATCH)    Imaging Review Dg Chest 2 View  04/07/2015  CLINICAL DATA:  3-4 week history of shortness of breath. EXAM: CHEST  2 VIEW COMPARISON:  02/18/2015. FINDINGS: The heart is enlarged but stable. Stable surgical changes from valve replacement surgery. There is vascular congestion and probable mild interstitial edema. No definite pleural effusions or focal infiltrates. The bony thorax is intact. IMPRESSION: Cardiac enlargement with vascular congestion and mild interstitial edema. No definite effusions or infiltrates. Electronically Signed   By: Marijo Sanes M.D.   On: 04/07/2015 22:58   Ct Angio Chest Pe W/cm &/or Wo Cm  04/07/2015  CLINICAL DATA:  Three-week history of shortness of breath. EXAM: CT ANGIOGRAPHY CHEST WITH CONTRAST TECHNIQUE: Multidetector CT imaging of the chest was performed using the standard protocol during bolus administration of intravenous contrast. Multiplanar CT image reconstructions and MIPs were obtained to evaluate the vascular anatomy. CONTRAST:  47mL  OMNIPAQUE IOHEXOL 350 MG/ML SOLN COMPARISON:  Chest CT 12/12/2013 FINDINGS: Mediastinum/Nodes: No breast masses, supraclavicular or axillary lymphadenopathy. Small scattered lymph nodes are noted. The thyroid gland appears normal. Mild cardiac enlargement but no pericardial effusion. Surgical changes since the prior study with median  sternotomy wires and a prosthetic aortic can mitral valves. Progressive mediastinal and hilar lymphadenopathy. The largest node measures 28 mm in the pretracheal area on image number 49. PET-CT may be helpful for further evaluation to exclude pathologic adenopathy. The aorta is normal in caliber. No dissection. Coronary artery calcifications are noted. The pulmonary arterial tree is well opacified. No filling defects to suggest pulmonary embolism. The pulmonary arteries are enlarged suggesting pulmonary hypertension. Lungs/Pleura: Diffuse mosaic pattern of attenuation in the lungs. This can be seen with pulmonary hypertension or small airways disease such as constrictive bronchiolitis, reactive airways disease/asthma, cryptogenic organizing pneumonia or hypersensitivity pneumonitis. More focal airspace consolidation in the lingula may suggest early bronchopneumonia. No pleural effusions or pulmonary edema. Stable surgical scarring changes in the left lower lobe. Upper abdomen: Suspect cirrhotic changes involving the liver. Musculoskeletal: No significant bony findings. There are median sternotomy wires in place but the sternum is ununited. Review of the MIP images confirms the above findings. IMPRESSION: 1. No CT findings for pulmonary embolism. 2. Normal thoracic aorta. 3. Stable postoperative changes from aortic and mitral valve replacement surgery. The sternotomy is ununited. 4. Progressive mediastinal and hilar lymphadenopathy. Could not exclude a lymphoproliferative process. PET-CT may be helpful for further evaluation. 5. Enlarged pulmonary artery suggesting pulmonary  hypertension. 6. Mosaic pattern of lung attenuation could be due to pulmonary hypertension, small airways disease such as constrictive bronchiolitis, reactive airways disease, cryptogenic organizing pneumonia or hypersensitivity pneumonitis. 7. Patchy airspace consolidation in the lingula may suggest early bronchopneumonia. 8. Suspect cirrhotic changes involving the liver. Electronically Signed   By: Marijo Sanes M.D.   On: 04/07/2015 23:26   I have personally reviewed and evaluated these images and lab results as part of my medical decision-making.   EKG Interpretation None      MDM  Patient was seen and evaluated in stable condition.  Results from outpatient labs reviewed.  Chest xray obtained that showed cardiac enlargement and pulmonary congestion.  CT negative for PE but with possible early pneumonia although patient without fever, cough, increased sputum.  Hb noted again to be low from even patient baseline after surgery at [facility].  In light of new onset anemia, symptoms, and no other convincing cause for exertional dyspnea although complicated patient with multiple other potential causes 1 unit pRBC ordered for transfusion.  Patient hemoccult negative.  Discussed case with hospitalist who agreed with admission and patent was admitted under her care for further treatment and management. Final diagnoses:  None    1. Symptomatic anemia  2. Exertional dyspnea    Harvel Quale, MD 04/08/15 [phone]

## 2015-04-08 NOTE — Progress Notes (Signed)
Patient did not get Tylenol and benadryl prior to blood transfusion per order. Blood started at the ED, NP on call notified.

## 2015-04-09 ENCOUNTER — Encounter (HOSPITAL_COMMUNITY): Admission: RE | Admit: 2015-04-09 | Payer: Medicare Other | Source: Ambulatory Visit

## 2015-04-09 ENCOUNTER — Ambulatory Visit: Payer: Medicare Other | Admitting: Nurse Practitioner

## 2015-04-09 ENCOUNTER — Other Ambulatory Visit (HOSPITAL_COMMUNITY): Payer: Medicare Other

## 2015-04-09 ENCOUNTER — Inpatient Hospital Stay (HOSPITAL_COMMUNITY): Payer: Medicare Other

## 2015-04-09 DIAGNOSIS — N183 Chronic kidney disease, stage 3 (moderate): Secondary | ICD-10-CM

## 2015-04-09 DIAGNOSIS — J449 Chronic obstructive pulmonary disease, unspecified: Secondary | ICD-10-CM

## 2015-04-09 DIAGNOSIS — J189 Pneumonia, unspecified organism: Secondary | ICD-10-CM

## 2015-04-09 DIAGNOSIS — R0600 Dyspnea, unspecified: Secondary | ICD-10-CM

## 2015-04-09 DIAGNOSIS — I10 Essential (primary) hypertension: Secondary | ICD-10-CM

## 2015-04-09 DIAGNOSIS — J45909 Unspecified asthma, uncomplicated: Secondary | ICD-10-CM

## 2015-04-09 DIAGNOSIS — R0609 Other forms of dyspnea: Secondary | ICD-10-CM

## 2015-04-09 DIAGNOSIS — D649 Anemia, unspecified: Secondary | ICD-10-CM | POA: Diagnosis not present

## 2015-04-09 LAB — LEGIONELLA PNEUMOPHILA SEROGP 1 UR AG: L. pneumophila Serogp 1 Ur Ag: NEGATIVE

## 2015-04-09 LAB — BASIC METABOLIC PANEL
ANION GAP: 6 (ref 5–15)
BUN: 20 mg/dL (ref 6–20)
CALCIUM: 9.2 mg/dL (ref 8.9–10.3)
CHLORIDE: 105 mmol/L (ref 101–111)
CO2: 28 mmol/L (ref 22–32)
Creatinine, Ser: 1.32 mg/dL — ABNORMAL HIGH (ref 0.44–1.00)
GFR calc non Af Amer: 44 mL/min — ABNORMAL LOW (ref >60)
GFR, EST AFRICAN AMERICAN: 52 mL/min — AB (ref >60)
Glucose, Bld: 85 mg/dL (ref 65–99)
POTASSIUM: 4.4 mmol/L (ref 3.5–5.1)
Sodium: 139 mmol/L (ref 135–145)

## 2015-04-09 LAB — CBC
HEMATOCRIT: 30.3 % — AB (ref 36.0–46.0)
HEMOGLOBIN: 8.3 g/dL — AB (ref 12.0–15.0)
MCH: 23.4 pg — ABNORMAL LOW (ref 26.0–34.0)
MCHC: 27.4 g/dL — ABNORMAL LOW (ref 30.0–36.0)
MCV: 85.4 fL (ref 78.0–100.0)
Platelets: 271 10*3/uL (ref 150–400)
RBC: 3.55 MIL/uL — AB (ref 3.87–5.11)
RDW: 17.7 % — ABNORMAL HIGH (ref 11.5–15.5)
WBC: 6.4 10*3/uL (ref 4.0–10.5)

## 2015-04-09 LAB — PROTIME-INR
INR: 2.01 — AB (ref 0.00–1.49)
Prothrombin Time: 22.7 seconds — ABNORMAL HIGH (ref 11.6–15.2)

## 2015-04-09 MED ORDER — AZITHROMYCIN 500 MG IV SOLR
500.0000 mg | INTRAVENOUS | Status: DC
  Filled 2015-04-09: qty 500

## 2015-04-09 NOTE — Progress Notes (Addendum)
[facility] PROGRESS NOTE  [name] [id number] DOB: 1960/06/03 DOA: 04/07/2015 PCP: [name], FNP  Length of stay: 1   Assessment/Plan: Active Problems:   Chronic asthmatic bronchitis (HCC)   Essential hypertension   Symptomatic anemia   History of mitral valve replacement with mechanical valve   CAP (community acquired pneumonia)   CKD (chronic kidney disease) stage 3, GFR 30-59 ml/min   Assessment and plan . CAP (community acquired pneumonia) - continue Rocephin/azithromycin. Day #2  Status post-aortic/mitral valve replacement, on 08/06/2014 where she underwent Aortic valve replacement 68mm StJude Regent valve, mitral valve replacement 45mm StJude mechanical valve finding rheumatic disease. At [facility] continue anticoagulation  . Essential hypertension - continue home medications   . Symptomatic anemia - status. post 2 units, negative  Hemoccult , possible the patient has chronic GI blood loss obtain anemia panel , also on anticoagulation, secondary to aortic/mitral valve repair  . Chronic asthmatic bronchitis (HCC) currently does not appear to be wheezing but mild crackles noted . Obtain sputum cultures, blood cultures if febrile or if decompensates. Currently requiring 3 L of oxygen, attempt to wean oxygen  . CKD (chronic kidney disease) stage 3, GFR 30-59 ml/min - chronic continue to monitor   DVT prophylaxsis on Coumadin  Code Status:      Code Status Orders        Start     Ordered   04/08/15 0303  Full code   Continuous     04/08/15 0303     Family Communication: family updated about patient's clinical progress Disposition Plan: PT/OT evaluation    Brief narrative: 54 y.o. female   has a past medical history of Allergic rhinitis; Noninfectious lymphedema; Undiagnosed cardiac murmurs; Mitral stenosis; Morbid obesity ([place]); Depressive disorder; Menopausal symptoms; Mitral and aortic heart valve diseases, unspecified  (March 2016); Vitamin D deficiency; HTN (hypertension); Hyperlipidemia; CAO (chronic airflow obstruction) ([place]); Gout; Hypertriglyceridemia; Sleep apnea; Kidney disease; Anxiety; Arthritis; Shortness of breath; Aortic stenosis; and Renal insufficiency.   Presented with worsening shortness of breath. Patient is being followed by cardiology she status post mechanical MVR as well as mechanical AVR at [facility] in March 2016 on Coumadin. Patient is known history of a flatter patient has required in the past cardioversion. Patient have had some persistent shortness of breath as has been also followed up by pulmonology she has persistent hypoxemia requires oxygen and home up to 3 L. Reports while at the doctors office her oxygenation had dropped into 70's on ambulation Patient presented today again to cardiology due to ongoing shortness of breath and increasing weight. Blood work was done in the office that showed hemoglobin was 7.9 from baseline of 9. Patient was instructed to present to emergency department. In ER patient was found to be Hemoccult negative from below repeat hemoglobin was 7.6 anemia panel has been ordered. She denies any fever no chest pain. She reports no cough CT of the chest was done showing no evidence of PE but possibly mild bronchopneumonia  Hospitalist was called for admission for symptomatic anemia  Consultants:  None  Procedures:  None  Antibiotics: Anti-infectives    Start     Dose/Rate Route Frequency Ordered Stop   04/08/15 0400  cefTRIAXone (ROCEPHIN) 1 g in dextrose 5 % 50 mL IVPB     1 g 100 mL/hr over 30 Minutes Intravenous Daily 04/08/15 0303     04/08/15 0400  azithromycin (ZITHROMAX) 500 mg in dextrose 5 % 250 mL IVPB  500 mg 250 mL/hr over 60 Minutes Intravenous Daily 04/08/15 0303           HPI/Subjective: Patient states that her shortness of breath, currently on 3 L, denies cough  Objective: Filed Vitals:   04/08/15 2047 04/09/15 0444 04/09/15  0955 04/09/15 1016  BP: 125/53 102/82 123/49   Pulse: 65 58 61   Temp: 98.1 F (36.7 C) 98.1 F (36.7 C) 98.9 F (37.2 C)   TempSrc: Oral Oral Oral   Resp: 19 20 20    Height:      Weight: 135.4 kg (298 lb 8.1 oz)     SpO2: 95% 96% 97% 94%    Intake/Output Summary (Last 24 hours) at 04/09/15 1242 Last data filed at 04/09/15 1036  Gross per 24 hour  Intake   1490 ml  Output      0 ml  Net   1490 ml    Exam:  General: No acute respiratory distress Lungs: Clear to auscultation bilaterally without wheezes or crackles Cardiovascular: Regular rate and rhythm without murmur gallop or rub normal S1 and S2 Abdomen: Nontender, nondistended, soft, bowel sounds positive, no rebound, no ascites, no appreciable mass Extremities: No significant cyanosis, clubbing, or edema bilateral lower extremities     Data Review   Micro Results No results found for this or any previous visit (from the past 240 hour(s)).  Radiology Reports Dg Chest 2 View  04/07/2015  CLINICAL DATA:  3-4 week history of shortness of breath. EXAM: CHEST  2 VIEW COMPARISON:  02/18/2015. FINDINGS: The heart is enlarged but stable. Stable surgical changes from valve replacement surgery. There is vascular congestion and probable mild interstitial edema. No definite pleural effusions or focal infiltrates. The bony thorax is intact. IMPRESSION: Cardiac enlargement with vascular congestion and mild interstitial edema. No definite effusions or infiltrates. Electronically Signed   By: Marijo Sanes M.D.   On: 04/07/2015 22:58   Ct Angio Chest Pe W/cm &/or Wo Cm  04/07/2015  CLINICAL DATA:  Three-week history of shortness of breath. EXAM: CT ANGIOGRAPHY CHEST WITH CONTRAST TECHNIQUE: Multidetector CT imaging of the chest was performed using the standard protocol during bolus administration of intravenous contrast. Multiplanar CT image reconstructions and MIPs were obtained to evaluate the vascular anatomy. CONTRAST:  78mL  OMNIPAQUE IOHEXOL 350 MG/ML SOLN COMPARISON:  Chest CT 12/12/2013 FINDINGS: Mediastinum/Nodes: No breast masses, supraclavicular or axillary lymphadenopathy. Small scattered lymph nodes are noted. The thyroid gland appears normal. Mild cardiac enlargement but no pericardial effusion. Surgical changes since the prior study with median sternotomy wires and a prosthetic aortic can mitral valves. Progressive mediastinal and hilar lymphadenopathy. The largest node measures 28 mm in the pretracheal area on image number 49. PET-CT may be helpful for further evaluation to exclude pathologic adenopathy. The aorta is normal in caliber. No dissection. Coronary artery calcifications are noted. The pulmonary arterial tree is well opacified. No filling defects to suggest pulmonary embolism. The pulmonary arteries are enlarged suggesting pulmonary hypertension. Lungs/Pleura: Diffuse mosaic pattern of attenuation in the lungs. This can be seen with pulmonary hypertension or small airways disease such as constrictive bronchiolitis, reactive airways disease/asthma, cryptogenic organizing pneumonia or hypersensitivity pneumonitis. More focal airspace consolidation in the lingula may suggest early bronchopneumonia. No pleural effusions or pulmonary edema. Stable surgical scarring changes in the left lower lobe. Upper abdomen: Suspect cirrhotic changes involving the liver. Musculoskeletal: No significant bony findings. There are median sternotomy wires in place but the sternum is ununited. Review of the MIP images  confirms the above findings. IMPRESSION: 1. No CT findings for pulmonary embolism. 2. Normal thoracic aorta. 3. Stable postoperative changes from aortic and mitral valve replacement surgery. The sternotomy is ununited. 4. Progressive mediastinal and hilar lymphadenopathy. Could not exclude a lymphoproliferative process. PET-CT may be helpful for further evaluation. 5. Enlarged pulmonary artery suggesting pulmonary  hypertension. 6. Mosaic pattern of lung attenuation could be due to pulmonary hypertension, small airways disease such as constrictive bronchiolitis, reactive airways disease, cryptogenic organizing pneumonia or hypersensitivity pneumonitis. 7. Patchy airspace consolidation in the lingula may suggest early bronchopneumonia. 8. Suspect cirrhotic changes involving the liver. Electronically Signed   By: Marijo Sanes M.D.   On: 04/07/2015 23:26     CBC  Recent Labs Lab 04/07/15 1249 04/07/15 1951 04/08/15 0743 04/08/15 1539 04/09/15 0348  WBC 8.8 7.7 7.5 6.5 6.4  HGB 7.9* 7.6* 8.2* 8.6* 8.3*  HCT 27.4* 26.6* 28.1* 30.0* 30.3*  PLT 348 309 285 273 271  MCV 80.8 84.7 84.4 84.5 85.4  MCH 23.3* 24.2* 24.6* 24.2* 23.4*  MCHC 28.8* 28.6* 29.2* 28.7* 27.4*  RDW 18.5* 18.4* 18.1* 17.6* 17.7*  LYMPHSABS 1.1  --  0.8  --   --   MONOABS 0.9  --  1.1*  --   --   EOSABS 0.2  --  0.2  --   --   BASOSABS 0.1  --  0.1  --   --     Chemistries   Recent Labs Lab 04/07/15 1249 04/07/15 1951 04/08/15 0743 04/09/15 0348  NA 139 136 137 139  K 5.0 4.6 4.9 4.4  CL 99 98* 100* 105  CO2 30 29 27 28   GLUCOSE 88 70 99 85  BUN 28* 28* 21* 20  CREATININE 1.42* 1.47* 1.31* 1.32*  CALCIUM 9.3 9.2 9.1 9.2  AST  --  27 23  --   ALT  --  17 15  --   ALKPHOS  --  67 58  --   BILITOT  --  0.9 0.8  --    ------------------------------------------------------------------------------------------------------------------ estimated creatinine clearance is 68.2 mL/min (by C-G formula based on Cr of 1.32). ------------------------------------------------------------------------------------------------------------------ No results for input(s): HGBA1C in the last 72 hours. ------------------------------------------------------------------------------------------------------------------ No results for input(s): CHOL, HDL, LDLCALC, TRIG, CHOLHDL, LDLDIRECT in the last 72  hours. ------------------------------------------------------------------------------------------------------------------ No results for input(s): TSH, T4TOTAL, T3FREE, THYROIDAB in the last 72 hours.  Invalid input(s): FREET3 ------------------------------------------------------------------------------------------------------------------ No results for input(s): VITAMINB12, FOLATE, FERRITIN, TIBC, IRON, RETICCTPCT in the last 72 hours.  Coagulation profile  Recent Labs Lab 04/02/15 1315 04/07/15 1951 04/08/15 0743 04/09/15 0348  INR 3.1 2.59* 2.21* 2.01*    No results for input(s): DDIMER in the last 72 hours.  Cardiac Enzymes  Recent Labs Lab 04/08/15 0743 04/08/15 1539  TROPONINI <0.03 <0.03   ------------------------------------------------------------------------------------------------------------------ Invalid input(s): POCBNP   CBG: No results for input(s): GLUCAP in the last 168 hours.     Studies: Dg Chest 2 View  04/07/2015  CLINICAL DATA:  3-4 week history of shortness of breath. EXAM: CHEST  2 VIEW COMPARISON:  02/18/2015. FINDINGS: The heart is enlarged but stable. Stable surgical changes from valve replacement surgery. There is vascular congestion and probable mild interstitial edema. No definite pleural effusions or focal infiltrates. The bony thorax is intact. IMPRESSION: Cardiac enlargement with vascular congestion and mild interstitial edema. No definite effusions or infiltrates. Electronically Signed   By: Marijo Sanes M.D.   On: 04/07/2015 22:58   Ct Angio Chest Pe W/cm &/or Wo  Cm  04/07/2015  CLINICAL DATA:  Three-week history of shortness of breath. EXAM: CT ANGIOGRAPHY CHEST WITH CONTRAST TECHNIQUE: Multidetector CT imaging of the chest was performed using the standard protocol during bolus administration of intravenous contrast. Multiplanar CT image reconstructions and MIPs were obtained to evaluate the vascular anatomy. CONTRAST:  34mL OMNIPAQUE  IOHEXOL 350 MG/ML SOLN COMPARISON:  Chest CT 12/12/2013 FINDINGS: Mediastinum/Nodes: No breast masses, supraclavicular or axillary lymphadenopathy. Small scattered lymph nodes are noted. The thyroid gland appears normal. Mild cardiac enlargement but no pericardial effusion. Surgical changes since the prior study with median sternotomy wires and a prosthetic aortic can mitral valves. Progressive mediastinal and hilar lymphadenopathy. The largest node measures 28 mm in the pretracheal area on image number 49. PET-CT may be helpful for further evaluation to exclude pathologic adenopathy. The aorta is normal in caliber. No dissection. Coronary artery calcifications are noted. The pulmonary arterial tree is well opacified. No filling defects to suggest pulmonary embolism. The pulmonary arteries are enlarged suggesting pulmonary hypertension. Lungs/Pleura: Diffuse mosaic pattern of attenuation in the lungs. This can be seen with pulmonary hypertension or small airways disease such as constrictive bronchiolitis, reactive airways disease/asthma, cryptogenic organizing pneumonia or hypersensitivity pneumonitis. More focal airspace consolidation in the lingula may suggest early bronchopneumonia. No pleural effusions or pulmonary edema. Stable surgical scarring changes in the left lower lobe. Upper abdomen: Suspect cirrhotic changes involving the liver. Musculoskeletal: No significant bony findings. There are median sternotomy wires in place but the sternum is ununited. Review of the MIP images confirms the above findings. IMPRESSION: 1. No CT findings for pulmonary embolism. 2. Normal thoracic aorta. 3. Stable postoperative changes from aortic and mitral valve replacement surgery. The sternotomy is ununited. 4. Progressive mediastinal and hilar lymphadenopathy. Could not exclude a lymphoproliferative process. PET-CT may be helpful for further evaluation. 5. Enlarged pulmonary artery suggesting pulmonary hypertension. 6.  Mosaic pattern of lung attenuation could be due to pulmonary hypertension, small airways disease such as constrictive bronchiolitis, reactive airways disease, cryptogenic organizing pneumonia or hypersensitivity pneumonitis. 7. Patchy airspace consolidation in the lingula may suggest early bronchopneumonia. 8. Suspect cirrhotic changes involving the liver. Electronically Signed   By: Marijo Sanes M.D.   On: 04/07/2015 23:26      No results found for: HGBA1C Lab Results  Component Value Date   LDLCALC 82 12/10/2014   CREATININE 1.32* 04/09/2015       Scheduled Meds: . sodium chloride   Intravenous Once  . sodium chloride   Intravenous Once  . acetaminophen  650 mg Oral Once  . aspirin EC  81 mg Oral Daily  . azithromycin  500 mg Intravenous Daily  . budesonide-formoterol  2 puff Inhalation BID  . buPROPion  300 mg Oral Daily  . cefTRIAXone (ROCEPHIN) IVPB 1 gram/50 mL D5W  1 g Intravenous Q0600  . citalopram  20 mg Oral Daily  . metoprolol tartrate  25 mg Oral BID  . montelukast  10 mg Oral Daily  . pravastatin  80 mg Oral q1800  . warfarin  3.75 mg Oral Once per day on Mon  . warfarin  7.5 mg Oral Once per day on Sun Tue Wed Thu Fri Sat  . Warfarin - Pharmacist Dosing Inpatient   Does not apply q1800   Continuous Infusions:   Active Problems:   Chronic asthmatic bronchitis (HCC)   Essential hypertension   Symptomatic anemia   History of mitral valve replacement with mechanical valve   CAP (community acquired pneumonia)  CKD (chronic kidney disease) stage 3, GFR 30-59 ml/min    Time spent: 45 minutes   [facility] Pager [phone]. If 7PM-7AM, please contact night-coverage at [email or web address], password [facility] 04/09/2015, 12:42 PM  LOS: 1 day

## 2015-04-09 NOTE — Progress Notes (Signed)
  Echocardiogram 2D Echocardiogram has been performed.  [name] 04/09/2015, 12:13 PM

## 2015-04-09 NOTE — Progress Notes (Signed)
ANTICOAGULATION CONSULT NOTE - Follow up  Pharmacy Consult for Coumadin  Indication: AVR & MVR  No Known Allergies  Patient Measurements: Height: 5\' 6"  (167.6 cm) Weight: 298 lb 8.1 oz (135.4 kg) IBW/kg (Calculated) : 59.3 Heparin Dosing Weight: n/a   Vital Signs: Temp: 98.9 F (37.2 C) (11/09 0955) Temp Source: Oral (11/09 0955) BP: 123/49 mmHg (11/09 0955) Pulse Rate: 61 (11/09 0955)  Labs:  Recent Labs  04/07/15 1951 04/08/15 0743 04/08/15 1539 04/09/15 0348  HGB 7.6* 8.2* 8.6* 8.3*  HCT 26.6* 28.1* 30.0* 30.3*  PLT 309 285 273 271  LABPROT 27.4* 24.4*  --  22.7*  INR 2.59* 2.21*  --  2.01*  CREATININE 1.47* 1.31*  --  1.32*  TROPONINI  --  <0.03 <0.03  --     Estimated Creatinine Clearance: 68.2 mL/min (by C-G formula based on Cr of 1.32).   Medical History: Past Medical History  Diagnosis Date  . Allergic rhinitis   . Noninfectious lymphedema   . Undiagnosed cardiac murmurs   . Mitral stenosis   . Morbid obesity ([place])   . Depressive disorder   . Menopausal symptoms   . Mitral and aortic heart valve diseases, unspecified March 2016    s/p AVR with #19 St Jude and s/p MVR with 24mm St. Jude per [name] at [facility]  . Vitamin D deficiency   . HTN (hypertension)   . Hyperlipidemia   . CAO (chronic airflow obstruction) (HCC)   . Gout   . Hypertriglyceridemia   . Sleep apnea   . Kidney disease     Dr. Graylon Gunning  . Anxiety   . Arthritis   . Shortness of breath     with exertion  . Aortic stenosis   . Renal insufficiency     Medications:  Prescriptions prior to admission  Medication Sig Dispense Refill Last Dose  . acetaminophen (TYLENOL) 650 MG CR tablet Take 1,300 mg by mouth 2 (two) times daily as needed for pain.    04/07/2015 at Unknown time  . albuterol (PROVENTIL HFA;VENTOLIN HFA) 108 (90 BASE) MCG/ACT inhaler Inhale 2 puffs into the lungs 2 (two) times daily as needed for wheezing or shortness of breath.   unknokwn  . albuterol (PROVENTIL)  (2.5 MG/3ML) 0.083% nebulizer solution Inhale 2.5 mg into the lungs every 4 (four) hours as needed.   unknown  . aspirin EC 81 MG tablet Take 81 mg by mouth daily.   04/07/2015 at Unknown time  . budesonide-formoterol (SYMBICORT) 80-4.5 MCG/ACT inhaler Inhale 2 puffs into the lungs 2 (two) times daily.   04/07/2015 at Unknown time  . buPROPion (WELLBUTRIN XL) 300 MG 24 hr tablet Take 300 mg by mouth daily.    04/07/2015 at Unknown time  . Cholecalciferol (VITAMIN D3) 2000 UNITS TABS Take 2,000 Units by mouth daily.    04/07/2015 at Unknown time  . citalopram (CELEXA) 20 MG tablet Take 20 mg by mouth daily.    04/07/2015 at Unknown time  . fexofenadine (ALLEGRA) 180 MG tablet Take 180 mg by mouth daily.   04/07/2015 at Unknown time  . furosemide (LASIX) 40 MG tablet Take 40 mg by mouth 2 (two) times daily.   04/07/2015 at Unknown time  . metoprolol tartrate (LOPRESSOR) 25 MG tablet Take 25 mg by mouth 2 (two) times daily.    04/07/2015 at 0630  . montelukast (SINGULAIR) 10 MG tablet Take 10 mg by mouth daily.    04/07/2015 at Unknown time  . potassium  chloride SA (K-DUR,KLOR-CON) 20 MEQ tablet Take 20 mEq by mouth 2 (two) times daily.   04/07/2015 at Unknown time  . pravastatin (PRAVACHOL) 80 MG tablet Take 80 mg by mouth daily.    04/07/2015 at Unknown time  . warfarin (COUMADIN) 7.5 MG tablet Take 3.75-7.5 mg by mouth See admin instructions. Take 1 tablet every day except take 1/2 tablet on Mondays   04/06/2015 at Unknown time  . NON FORMULARY Place 2.5 L into the nose continuous as needed (sob).    Taking  . warfarin (COUMADIN) 7.5 MG tablet TAKE AS DIRECTED BY ANTICOAGULATION CLINIC (Patient not taking: Reported on 04/07/2015) 30 tablet 3 Not Taking at Unknown time    Assessment: 3 YOF who presented [facility] ED on 11/7 with shortness of breath. Found to have symptomatic anemia and transfused 2 units PRBCs. 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  Pharmacy consulted to resume home  Coumadin therapy. INR on admission was therapeutic at 2.59 on dose as noted below, last taken PTA on 11/6.   Today INR = 2.01, below goal of 2.5-3.5 for mechanical MVR & AVR.  Symptomatic anemia - -s/p 2 units ,Hgb improved 7.6>8.6> 8.3, pltc 271K stable , no bleeding noted-Hemoccult stools negative. Azithromycin for CAP started on 11/8. Azithromycin may increase coumadin effect.   Home Coumadin dose: 7.5 mg daily except 3.75 mg on Mondays  Goal of Therapy:  INR 2.5-3.5 (Per [facility] clinic notes) Monitor platelets by anticoagulation protocol: Yes   Plan:  Continue home Coumadin dose 7.5 mg daily except 3.75 mg on every  Monday. -Monitor daily PT/INR   Nicole Cella, RPh Clinical Pharmacist Pager: [phone] 04/09/2015 4:16 PM

## 2015-04-09 NOTE — Clinical Documentation Improvement (Signed)
Hospitalist  Possible Clinical Conditions:  - Chronic Hypoxic Respiratory Failure  - Other condition  - Unable to clinically determine  Clinical Information: Patient requires home O2 up to 3 liters for persistent hypoxemia as documented in the H&P."   Please exercise your independent, professional judgment when responding. A specific answer is not anticipated or expected.   Thank You,  Erling Conte  RN BSN CCDS [phone] Health Information Management [place]

## 2015-04-10 ENCOUNTER — Encounter: Payer: Self-pay | Admitting: Interventional Cardiology

## 2015-04-10 DIAGNOSIS — R0609 Other forms of dyspnea: Secondary | ICD-10-CM

## 2015-04-10 LAB — TYPE AND SCREEN
ABO/RH(D): O POS
Antibody Screen: NEGATIVE
Unit division: 0
Unit division: 0
Unit division: 0

## 2015-04-10 LAB — COMPREHENSIVE METABOLIC PANEL
ALK PHOS: 65 U/L (ref 38–126)
ALT: 16 U/L (ref 14–54)
ANION GAP: 7 (ref 5–15)
AST: 22 U/L (ref 15–41)
Albumin: 3.5 g/dL (ref 3.5–5.0)
BUN: 20 mg/dL (ref 6–20)
CALCIUM: 9.3 mg/dL (ref 8.9–10.3)
CHLORIDE: 103 mmol/L (ref 101–111)
CO2: 26 mmol/L (ref 22–32)
CREATININE: 1.26 mg/dL — AB (ref 0.44–1.00)
GFR, EST AFRICAN AMERICAN: 55 mL/min — AB (ref >60)
GFR, EST NON AFRICAN AMERICAN: 47 mL/min — AB (ref >60)
Glucose, Bld: 103 mg/dL — ABNORMAL HIGH (ref 65–99)
Potassium: 4.4 mmol/L (ref 3.5–5.1)
SODIUM: 136 mmol/L (ref 135–145)
Total Bilirubin: 0.5 mg/dL (ref 0.3–1.2)
Total Protein: 7.1 g/dL (ref 6.5–8.1)

## 2015-04-10 LAB — CBC
HCT: 31 % — ABNORMAL LOW (ref 36.0–46.0)
Hemoglobin: 9 g/dL — ABNORMAL LOW (ref 12.0–15.0)
MCH: 24.7 pg — AB (ref 26.0–34.0)
MCHC: 29 g/dL — AB (ref 30.0–36.0)
MCV: 85.2 fL (ref 78.0–100.0)
PLATELETS: 260 10*3/uL (ref 150–400)
RBC: 3.64 MIL/uL — AB (ref 3.87–5.11)
RDW: 17.3 % — ABNORMAL HIGH (ref 11.5–15.5)
WBC: 7.2 10*3/uL (ref 4.0–10.5)

## 2015-04-10 LAB — PROTIME-INR
INR: 1.97 — ABNORMAL HIGH (ref 0.00–1.49)
PROTHROMBIN TIME: 22.3 s — AB (ref 11.6–15.2)

## 2015-04-10 MED ORDER — FUROSEMIDE 40 MG PO TABS: 40.0000 mg | ORAL_TABLET | Freq: Two times a day (BID) | ORAL | Status: DC

## 2015-04-10 MED ORDER — AZITHROMYCIN 500 MG PO TABS: 500.0000 mg | ORAL_TABLET | Freq: Every day | ORAL | Status: DC

## 2015-04-10 MED ORDER — CEFDINIR 300 MG PO CAPS: 300.0000 mg | ORAL_CAPSULE | Freq: Two times a day (BID) | ORAL | Status: DC

## 2015-04-10 MED ORDER — WARFARIN SODIUM 10 MG PO TABS
10.0000 mg | ORAL_TABLET | ORAL | Status: AC
  Administered 2015-04-10: 10 mg via ORAL
  Filled 2015-04-10: qty 1

## 2015-04-10 NOTE — Progress Notes (Signed)
ANTICOAGULATION CONSULT NOTE - Follow up  Pharmacy Consult for Coumadin  Indication: AVR & MVR  No Known Allergies  Patient Measurements: Height: 5\' 6"  (167.6 cm) Weight: (!) 301 lb (136.533 kg) IBW/kg (Calculated) : 59.3 Heparin Dosing Weight: n/a   Vital Signs: Temp: 97.7 F (36.5 C) (11/10 0739) Temp Source: Oral (11/10 0739) BP: 101/66 mmHg (11/10 0739) Pulse Rate: 61 (11/10 0739)  Labs:  Recent Labs  04/08/15 0743 04/08/15 1539 04/09/15 0348 04/10/15 0613  HGB 8.2* 8.6* 8.3* 9.0*  HCT 28.1* 30.0* 30.3* 31.0*  PLT 285 273 271 260  LABPROT 24.4*  --  22.7* 22.3*  INR 2.21*  --  2.01* 1.97*  CREATININE 1.31*  --  1.32* 1.26*  TROPONINI <0.03 <0.03  --   --     Estimated Creatinine Clearance: 71.8 mL/min (by C-G formula based on Cr of 1.26).   Medical History: Past Medical History  Diagnosis Date  . Allergic rhinitis   . Noninfectious lymphedema   . Undiagnosed cardiac murmurs   . Mitral stenosis   . Morbid obesity ([place])   . Depressive disorder   . Menopausal symptoms   . Mitral and aortic heart valve diseases, unspecified March 2016    s/p AVR with #19 [employer] and s/p MVR with 46mm [employer] Jude per [name] at [facility]  . Vitamin D deficiency   . HTN (hypertension)   . Hyperlipidemia   . CAO (chronic airflow obstruction) ([facility])   . Gout   . Hypertriglyceridemia   . Sleep apnea   . Kidney disease     Dr. Graylon Gunning  . Anxiety   . Arthritis   . Shortness of breath     with exertion  . Aortic stenosis   . Renal insufficiency     Medications:  Prescriptions prior to admission  Medication Sig Dispense Refill Last Dose  . acetaminophen (TYLENOL) 650 MG CR tablet Take 1,300 mg by mouth 2 (two) times daily as needed for pain.    04/07/2015 at Unknown time  . albuterol (PROVENTIL HFA;VENTOLIN HFA) 108 (90 BASE) MCG/ACT inhaler Inhale 2 puffs into the lungs 2 (two) times daily as needed for wheezing or shortness of breath.   unknokwn  . albuterol (PROVENTIL)  (2.5 MG/3ML) 0.083% nebulizer solution Inhale 2.5 mg into the lungs every 4 (four) hours as needed.   unknown  . aspirin EC 81 MG tablet Take 81 mg by mouth daily.   04/07/2015 at Unknown time  . budesonide-formoterol (SYMBICORT) 80-4.5 MCG/ACT inhaler Inhale 2 puffs into the lungs 2 (two) times daily.   04/07/2015 at Unknown time  . buPROPion (WELLBUTRIN XL) 300 MG 24 hr tablet Take 300 mg by mouth daily.    04/07/2015 at Unknown time  . Cholecalciferol (VITAMIN D3) 2000 UNITS TABS Take 2,000 Units by mouth daily.    04/07/2015 at Unknown time  . citalopram (CELEXA) 20 MG tablet Take 20 mg by mouth daily.    04/07/2015 at Unknown time  . fexofenadine (ALLEGRA) 180 MG tablet Take 180 mg by mouth daily.   04/07/2015 at Unknown time  . metoprolol tartrate (LOPRESSOR) 25 MG tablet Take 25 mg by mouth 2 (two) times daily.    04/07/2015 at 0630  . montelukast (SINGULAIR) 10 MG tablet Take 10 mg by mouth daily.    04/07/2015 at Unknown time  . potassium chloride SA (K-DUR,KLOR-CON) 20 MEQ tablet Take 20 mEq by mouth 2 (two) times daily.   04/07/2015 at Unknown time  .  pravastatin (PRAVACHOL) 80 MG tablet Take 80 mg by mouth daily.    04/07/2015 at Unknown time  . warfarin (COUMADIN) 7.5 MG tablet Take 3.75-7.5 mg by mouth See admin instructions. Take 1 tablet every day except take 1/2 tablet on Mondays   04/06/2015 at Unknown time  . [DISCONTINUED] furosemide (LASIX) 40 MG tablet Take 40 mg by mouth 2 (two) times daily.   04/07/2015 at Unknown time  . NON FORMULARY Place 2.5 L into the nose continuous as needed (sob).    Taking  . warfarin (COUMADIN) 7.5 MG tablet TAKE AS DIRECTED BY ANTICOAGULATION CLINIC (Patient not taking: Reported on 04/07/2015) 30 tablet 3 Not Taking at Unknown time    Assessment: 56 YOF who presented to [facility] ED on 11/7 with shortness of breath. Found to have symptomatic anemia and transfused 2 units PRBCs. 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  Pharmacy consulted  to resume home Coumadin therapy. INR on admission was therapeutic at 2.59 on dose as noted below, last taken PTA on 11/6.  Home Coumadin dose: 7.5 mg daily except 3.75 mg on Mondays  Today INR is down to 1.97,  Decreased steadily over last few days despite conintuing her home dosage of 7.5mg  daily except 3.37mg  qMonday. Also on IV Azithromycin whic can increase coumadin effect, though does not always effect coumadin as much as some other macrolides (erythromycin). Hgb stable at 9.0 and pltc wnl at 260K. No bleeding noted.   At discharge the patient is to continue oral azithromycin & cefdinir x 5 days. I discussed her coumadin dosing, INR values and antibiotics with Fuller Canada, pharmacist at coumadin clinic who monitors the patient's coumadin dosing/ INR.  We agreed to increase her coumadin dose to 10mg  today (none at home tonight), tomorrow take 1.5 tablets x 7.5mg  = 11.25mg  then resume usual coumadin dose. Moved her next INR check appointment up to Tuesday 11/15 @ 1:45pm.     Goal of Therapy:  INR 2.5-3.5 (Per [facility] clinic notes) Monitor platelets by anticoagulation protocol: Yes   Plan:  Patient is to be discharged home now. I have instructed the patient that we will give her coumadin 10mg  dose here today (04/10/15) prior to discharge.  Do not take any coumadin tonight 04/10/15, tomorrow (Friday 11/11) take 1.5 tablets (7.5mg  x 1.5tablet =11.25 mg), then next day resume usual coumadin dose as directed by coumadin clinic (7.5mg  daily except 3.75mg  po every Monday).  Watch for bleeding and call MD if noted. Appointment for INR check at coumadin clinic on 04/15/15 Tuesday at 1:45 PM.  This was discussed with the patient prior to discharge.     Nicole Cella, RPh Clinical Pharmacist Pager: [phone] 04/10/2015 11:36 AM

## 2015-04-10 NOTE — Discharge Instructions (Addendum)
Information on my medicine - Coumadin   (Warfarin)     Why was Coumadin prescribed for you?  You were taking coumadin prior to this admission.  Coumadin was prescribed for you because you have a blood clot or a medical condition that can cause an increased risk of forming blood clots. Blood clots can cause serious health problems by blocking the flow of blood to the heart, lung, or brain. Coumadin can prevent harmful blood clots from forming. As a reminder your indication for Coumadin is:   Blood Clot Prevention After Heart Valve Surgery You were taking coumadin prior to this admission.   What test will check on my response to Coumadin? While on Coumadin (warfarin) you will need to have an INR test regularly to ensure that your dose is keeping you in the desired range. The INR (international normalized ratio) number is calculated from the result of the laboratory test called prothrombin time (PT).  If an INR APPOINTMENT HAS NOT ALREADY BEEN MADE FOR YOU please schedule an appointment to have this lab work done by your health care provider within 7 days. Your INR goal is usually a number between:  2 to 3 or your provider may give you a more narrow range like 2-2.5.  Ask your health care provider during an office visit what your goal INR is.  What  do you need to  know  About  COUMADIN? Take Coumadin (warfarin) exactly as prescribed by your healthcare provider about the same time each day.  DO NOT stop taking without talking to the doctor who prescribed the medication.  Stopping without other blood clot prevention medication to take the place of Coumadin may increase your risk of developing a new clot or stroke.  Get refills before you run out.  What do you do if you miss a dose? If you miss a dose, take it as soon as you remember on the same day then continue your regularly scheduled regimen the next day.  Do not take two doses of Coumadin at the same time.  Important Safety Information A  possible side effect of Coumadin (Warfarin) is an increased risk of bleeding. You should call your healthcare provider right away if you experience any of the following: ? Bleeding from an injury or your nose that does not stop. ? Unusual colored urine (red or dark brown) or unusual colored stools (red or black). ? Unusual bruising for unknown reasons. ? A serious fall or if you hit your head (even if there is no bleeding).  Some foods or medicines interact with Coumadin (warfarin) and might alter your response to warfarin. To help avoid this: ? Eat a balanced diet, maintaining a consistent amount of Vitamin K. ? Notify your provider about major diet changes you plan to make. ? Avoid alcohol or limit your intake to 1 drink for women and 2 drinks for men per day. (1 drink is 5 oz. wine, 12 oz. beer, or 1.5 oz. liquor.)  Make sure that ANY health care provider who prescribes medication for you knows that you are taking Coumadin (warfarin).  Also make sure the healthcare provider who is monitoring your Coumadin knows when you have started a new medication including herbals and non-prescription products.  Coumadin (Warfarin)  Major Drug Interactions  Increased Warfarin Effect Decreased Warfarin Effect  Alcohol (large quantities) Antibiotics (esp. Septra/Bactrim, Flagyl, Cipro) Amiodarone (Cordarone) Aspirin (ASA) Cimetidine (Tagamet) Megestrol (Megace) NSAIDs (ibuprofen, naproxen, etc.) Piroxicam (Feldene) Propafenone (Rythmol SR) Propranolol (Inderal) Isoniazid (INH) Posaconazole (  Noxafil) Barbiturates (Phenobarbital) Carbamazepine (Tegretol) Chlordiazepoxide (Librium) Cholestyramine (Questran) Griseofulvin Oral Contraceptives Rifampin Sucralfate (Carafate) Vitamin K   Coumadin (Warfarin) Major Herbal Interactions  Increased Warfarin Effect Decreased Warfarin Effect  Garlic Ginseng Ginkgo biloba Coenzyme Q10 Green tea St. Johns wort    Coumadin (Warfarin) FOOD  Interactions  Eat a consistent number of servings per week of foods HIGH in Vitamin K (1 serving =  cup)  Collards (cooked, or boiled & drained) Kale (cooked, or boiled & drained) Mustard greens (cooked, or boiled & drained) Parsley *serving size only =  cup Spinach (cooked, or boiled & drained) Swiss chard (cooked, or boiled & drained) Turnip greens (cooked, or boiled & drained)  Eat a consistent number of servings per week of foods MEDIUM-HIGH in Vitamin K (1 serving = 1 cup)  Asparagus (cooked, or boiled & drained) Broccoli (cooked, boiled & drained, or raw & chopped) Brussel sprouts (cooked, or boiled & drained) *serving size only =  cup Lettuce, raw (green leaf, endive, romaine) Spinach, raw Turnip greens, raw & chopped   These websites have more information on Coumadin (warfarin):  FailFactory.se; VeganReport.com.au;   I have instructed the patient that we will give her coumadin 10mg  dose here today (04/10/15) prior to discharge.  Do not take any coumadin tonight 04/10/15, tomorrow (Friday 11/11) take 1.5 tablets (7.5mg  x 1.5tablet =11.25 mg), then next day resume usual coumadin dose as directed by coumadin clinic (7.5mg  daily except 3.75mg  po every Monday).  Appointment for INR check at coumadin clinic on 04/15/15 Tuesday at 1:45 PM.  This was discussed with the patient prior to discharge.    Nicole Cella, RPh Clinical Pharmacist Pager: [phone] 04/10/2015 11:32 AM

## 2015-04-10 NOTE — Progress Notes (Signed)
Patient Discharge: Disposition: Patient discharged to home with her sister. Education: Reviewed medications, prescriptions, follow-up appointments, and discharge instructions, understood and acknowledged. IV: Discontinued both peripheral IVs before discharge. Telemetry: Discontinued before discharge.  CCMD notified. Transportation: Patient transported in w/c out of the unit with staff and her sister accompanying. Belongings: Patient took all her belongings with her.

## 2015-04-10 NOTE — Discharge Summary (Signed)
Physician Discharge Summary  [name] MRN: [id number] DOB/AGE: [birth year] 54 y.o.  PCP: [name], FNP   Admit date: 04/07/2015 Discharge date: 04/10/2015  Discharge Diagnoses:     Active Problems:   Chronic asthmatic bronchitis (HCC)   Essential hypertension   Symptomatic anemia   History of mitral valve replacement with mechanical valve   CAP (community acquired pneumonia)   CKD (chronic kidney disease) stage 3, GFR 30-59 ml/min   Exertional dyspnea    Follow-up recommendations Follow-up with PCP in 3-5 days , including all  additional recommended appointments as below Follow-up CBC, CMP in 3-5 days Patient needs a follow-up PET CT scan for mediastinal and hilar lymphadenopathy in 2-3 weeks Patient needs outpatient gastroenterology consultation for anemia, possible EGD/colonoscopy INR on 11/14    Medication List    TAKE these medications        acetaminophen 650 MG CR tablet  Commonly known as:  TYLENOL  Take 1,300 mg by mouth 2 (two) times daily as needed for pain.     albuterol 108 (90 BASE) MCG/ACT inhaler  Commonly known as:  PROVENTIL HFA;VENTOLIN HFA  Inhale 2 puffs into the lungs 2 (two) times daily as needed for wheezing or shortness of breath.     albuterol (2.5 MG/3ML) 0.083% nebulizer solution  Commonly known as:  PROVENTIL  Inhale 2.5 mg into the lungs every 4 (four) hours as needed.     aspirin EC 81 MG tablet  Take 81 mg by mouth daily.     azithromycin 500 MG tablet  Commonly known as:  ZITHROMAX  Take 1 tablet (500 mg total) by mouth daily.     buPROPion 300 MG 24 hr tablet  Commonly known as:  WELLBUTRIN XL  Take 300 mg by mouth daily.     cefdinir 300 MG capsule  Commonly known as:  OMNICEF  Take 1 capsule (300 mg total) by mouth 2 (two) times daily.     citalopram 20 MG tablet  Commonly known as:  CELEXA  Take 20 mg by mouth daily.     fexofenadine 180 MG tablet  Commonly known as:  ALLEGRA  Take 180 mg by mouth  daily.     furosemide 40 MG tablet  Commonly known as:  LASIX  Take 1 tablet (40 mg total) by mouth 2 (two) times daily.  Start taking on:  04/14/2015     metoprolol tartrate 25 MG tablet  Commonly known as:  LOPRESSOR  Take 25 mg by mouth 2 (two) times daily.     montelukast 10 MG tablet  Commonly known as:  SINGULAIR  Take 10 mg by mouth daily.     NON FORMULARY  Place 2.5 L into the nose continuous as needed (sob).     potassium chloride SA 20 MEQ tablet  Commonly known as:  K-DUR,KLOR-CON  Take 20 mEq by mouth 2 (two) times daily.     pravastatin 80 MG tablet  Commonly known as:  PRAVACHOL  Take 80 mg by mouth daily.     SYMBICORT 80-4.5 MCG/ACT inhaler  Generic drug:  budesonide-formoterol  Inhale 2 puffs into the lungs 2 (two) times daily.     Vitamin D3 2000 UNITS Tabs  Take 2,000 Units by mouth daily.     warfarin 7.5 MG tablet  Commonly known as:  COUMADIN  Take 3.75-7.5 mg by mouth See admin instructions. Take 1 tablet every day except take 1/2 tablet on Mondays     warfarin 7.5 MG tablet  Commonly known as:  COUMADIN  TAKE AS DIRECTED BY ANTICOAGULATION CLINIC         Discharge Condition: Stable   Discharge Instructions       Discharge Instructions    Diet - low sodium heart healthy    Complete by:  As directed      Diet - low sodium heart healthy    Complete by:  As directed      Increase activity slowly    Complete by:  As directed      Increase activity slowly    Complete by:  As directed            No Known Allergies    Disposition: 01-Home or Self Care   Consults:  None     Significant Diagnostic Studies:  Dg Chest 2 View  04/07/2015  CLINICAL DATA:  3-4 week history of shortness of breath. EXAM: CHEST  2 VIEW COMPARISON:  02/18/2015. FINDINGS: The heart is enlarged but stable. Stable surgical changes from valve replacement surgery. There is vascular congestion and probable mild interstitial edema. No definite pleural  effusions or focal infiltrates. The bony thorax is intact. IMPRESSION: Cardiac enlargement with vascular congestion and mild interstitial edema. No definite effusions or infiltrates. Electronically Signed   By: Marijo Sanes M.D.   On: 04/07/2015 22:58   Ct Angio Chest Pe W/cm &/or Wo Cm  04/07/2015  CLINICAL DATA:  Three-week history of shortness of breath. EXAM: CT ANGIOGRAPHY CHEST WITH CONTRAST TECHNIQUE: Multidetector CT imaging of the chest was performed using the standard protocol during bolus administration of intravenous contrast. Multiplanar CT image reconstructions and MIPs were obtained to evaluate the vascular anatomy. CONTRAST:  61mL OMNIPAQUE IOHEXOL 350 MG/ML SOLN COMPARISON:  Chest CT 12/12/2013 FINDINGS: Mediastinum/Nodes: No breast masses, supraclavicular or axillary lymphadenopathy. Small scattered lymph nodes are noted. The thyroid gland appears normal. Mild cardiac enlargement but no pericardial effusion. Surgical changes since the prior study with median sternotomy wires and a prosthetic aortic can mitral valves. Progressive mediastinal and hilar lymphadenopathy. The largest node measures 28 mm in the pretracheal area on image number 49. PET-CT may be helpful for further evaluation to exclude pathologic adenopathy. The aorta is normal in caliber. No dissection. Coronary artery calcifications are noted. The pulmonary arterial tree is well opacified. No filling defects to suggest pulmonary embolism. The pulmonary arteries are enlarged suggesting pulmonary hypertension. Lungs/Pleura: Diffuse mosaic pattern of attenuation in the lungs. This can be seen with pulmonary hypertension or small airways disease such as constrictive bronchiolitis, reactive airways disease/asthma, cryptogenic organizing pneumonia or hypersensitivity pneumonitis. More focal airspace consolidation in the lingula may suggest early bronchopneumonia. No pleural effusions or pulmonary edema. Stable surgical scarring changes  in the left lower lobe. Upper abdomen: Suspect cirrhotic changes involving the liver. Musculoskeletal: No significant bony findings. There are median sternotomy wires in place but the sternum is ununited. Review of the MIP images confirms the above findings. IMPRESSION: 1. No CT findings for pulmonary embolism. 2. Normal thoracic aorta. 3. Stable postoperative changes from aortic and mitral valve replacement surgery. The sternotomy is ununited. 4. Progressive mediastinal and hilar lymphadenopathy. Could not exclude a lymphoproliferative process. PET-CT may be helpful for further evaluation. 5. Enlarged pulmonary artery suggesting pulmonary hypertension. 6. Mosaic pattern of lung attenuation could be due to pulmonary hypertension, small airways disease such as constrictive bronchiolitis, reactive airways disease, cryptogenic organizing pneumonia or hypersensitivity pneumonitis. 7. Patchy airspace consolidation in the lingula may suggest early bronchopneumonia. 8. Suspect cirrhotic changes  involving the liver. Electronically Signed   By: Marijo Sanes M.D.   On: 04/07/2015 23:26    2-D echo LV EF: 55% -  60%  ------------------------------------------------------------------- Indications:   Dyspnea 786.09.  ------------------------------------------------------------------- History:  PMH:  Murmur. Risk factors: Dyslipidemia.  ------------------------------------------------------------------- Study Conclusions  - Left ventricle: The cavity size was normal. Wall thickness was increased in a pattern of mild LVH. Systolic function was normal. The estimated ejection fraction was in the range of 55% to 60%. Wall motion was normal; there were no regional wall motion abnormalities. - Aortic valve: A mechanical prosthesis was present. Valve area (VTI): 0.99 cm^2. Valve area (Vmax): 1.04 cm^2. Valve area (Vmean): 1.11 cm^2. - Mitral valve: A mechanical prosthesis was present. Valve  area by continuity equation (using LVOT flow): 0.7 cm^2. - Left atrium: The atrium was moderately to severely dilated. - Pulmonary arteries: PA peak pressure: 33 mm Hg (S).   Filed Weights   04/08/15 0308 04/08/15 2047 04/09/15 2201  Weight: 134.2 kg (295 lb 13.7 oz) 135.4 kg (298 lb 8.1 oz) 136.533 kg (301 lb)     Microbiology: No results found for this or any previous visit (from the past 240 hour(s)).     Blood Culture    Component Value Date/Time   SDES BRONCHIAL ALVEOLAR LAVAGE 10/25/2012 0821   SDES BRONCHIAL ALVEOLAR LAVAGE 10/25/2012 0821   SDES BRONCHIAL ALVEOLAR LAVAGE 10/25/2012 0821   SPECREQUEST Normal 10/25/2012 0821   SPECREQUEST Normal 10/25/2012 0821   SPECREQUEST Normal 10/25/2012 0821   CULT NO ACID FAST BACILLI ISOLATED IN 6 WEEKS 10/25/2012 0821   CULT  10/25/2012 0821    HAEMOPHILUS INFLUENZAE Note: BETA LACTAMASE NEGATIVE   CULT No Fungi Isolated in 4 Weeks 10/25/2012 0821   REPTSTATUS 12/07/2012 FINAL 10/25/2012 0821   REPTSTATUS 10/27/2012 FINAL 10/25/2012 0821   REPTSTATUS 11/22/2012 FINAL 10/25/2012 0071      Labs: Results for orders placed or performed during the hospital encounter of 04/07/15 (from the past 48 hour(s))  Troponin I (q 6hr x 3)     Status: None   Collection Time: 04/08/15  3:39 PM  Result Value Ref Range   Troponin I <0.03 <0.031 ng/mL    Comment:        NO INDICATION OF MYOCARDIAL INJURY.   CBC     Status: Abnormal   Collection Time: 04/08/15  3:39 PM  Result Value Ref Range   WBC 6.5 4.0 - 10.5 K/uL   RBC 3.55 (L) 3.87 - 5.11 MIL/uL   Hemoglobin 8.6 (L) 12.0 - 15.0 g/dL   HCT 30.0 (L) 36.0 - 46.0 %   MCV 84.5 78.0 - 100.0 fL   MCH 24.2 (L) 26.0 - 34.0 pg   MCHC 28.7 (L) 30.0 - 36.0 g/dL   RDW 17.6 (H) 11.5 - 15.5 %   Platelets 273 150 - 400 K/uL  CBC     Status: Abnormal   Collection Time: 04/09/15  3:48 AM  Result Value Ref Range   WBC 6.4 4.0 - 10.5 K/uL   RBC 3.55 (L) 3.87 - 5.11 MIL/uL   Hemoglobin  8.3 (L) 12.0 - 15.0 g/dL   HCT 30.3 (L) 36.0 - 46.0 %   MCV 85.4 78.0 - 100.0 fL   MCH 23.4 (L) 26.0 - 34.0 pg   MCHC 27.4 (L) 30.0 - 36.0 g/dL   RDW 17.7 (H) 11.5 - 15.5 %   Platelets 271 150 - 400 K/uL  Basic metabolic panel  Status: Abnormal   Collection Time: 04/09/15  3:48 AM  Result Value Ref Range   Sodium 139 135 - 145 mmol/L   Potassium 4.4 3.5 - 5.1 mmol/L   Chloride 105 101 - 111 mmol/L   CO2 28 22 - 32 mmol/L   Glucose, Bld 85 65 - 99 mg/dL   BUN 20 6 - 20 mg/dL   Creatinine, Ser 1.32 (H) 0.44 - 1.00 mg/dL   Calcium 9.2 8.9 - 10.3 mg/dL   GFR calc non Af Amer 44 (L) >60 mL/min   GFR calc Af Amer 52 (L) >60 mL/min    Comment: (NOTE) The eGFR has been calculated using the CKD EPI equation. This calculation has not been validated in all clinical situations. eGFR's persistently <60 mL/min signify possible Chronic Kidney Disease.    Anion gap 6 5 - 15  Protime-INR     Status: Abnormal   Collection Time: 04/09/15  3:48 AM  Result Value Ref Range   Prothrombin Time 22.7 (H) 11.6 - 15.2 seconds   INR 2.01 (H) 0.00 - 1.49  Protime-INR     Status: Abnormal   Collection Time: 04/10/15  6:13 AM  Result Value Ref Range   Prothrombin Time 22.3 (H) 11.6 - 15.2 seconds   INR 1.97 (H) 0.00 - 1.49  CBC     Status: Abnormal   Collection Time: 04/10/15  6:13 AM  Result Value Ref Range   WBC 7.2 4.0 - 10.5 K/uL   RBC 3.64 (L) 3.87 - 5.11 MIL/uL   Hemoglobin 9.0 (L) 12.0 - 15.0 g/dL   HCT 31.0 (L) 36.0 - 46.0 %   MCV 85.2 78.0 - 100.0 fL   MCH 24.7 (L) 26.0 - 34.0 pg   MCHC 29.0 (L) 30.0 - 36.0 g/dL   RDW 17.3 (H) 11.5 - 15.5 %   Platelets 260 150 - 400 K/uL  Comprehensive metabolic panel     Status: Abnormal   Collection Time: 04/10/15  6:13 AM  Result Value Ref Range   Sodium 136 135 - 145 mmol/L   Potassium 4.4 3.5 - 5.1 mmol/L   Chloride 103 101 - 111 mmol/L   CO2 26 22 - 32 mmol/L   Glucose, Bld 103 (H) 65 - 99 mg/dL   BUN 20 6 - 20 mg/dL   Creatinine, Ser 1.26  (H) 0.44 - 1.00 mg/dL   Calcium 9.3 8.9 - 10.3 mg/dL   Total Protein 7.1 6.5 - 8.1 g/dL   Albumin 3.5 3.5 - 5.0 g/dL   AST 22 15 - 41 U/L   ALT 16 14 - 54 U/L   Alkaline Phosphatase 65 38 - 126 U/L   Total Bilirubin 0.5 0.3 - 1.2 mg/dL   GFR calc non Af Amer 47 (L) >60 mL/min   GFR calc Af Amer 55 (L) >60 mL/min    Comment: (NOTE) The eGFR has been calculated using the CKD EPI equation. This calculation has not been validated in all clinical situations. eGFR's persistently <60 mL/min signify possible Chronic Kidney Disease.    Anion gap 7 5 - 15     Lipid Panel     Component Value Date/Time   CHOL 158 12/10/2014 0822   TRIG 132.0 12/10/2014 0822   HDL 49.30 12/10/2014 0822   CHOLHDL 3 12/10/2014 0822   VLDL 26.4 12/10/2014 0822   LDLCALC 82 12/10/2014 0822     No results found for: HGBA1C   Lab Results  Component Value Date   LDLCALC 82  12/10/2014   CREATININE 1.26* 04/10/2015     HPI :*54 y.o. female   has a past medical history of Allergic rhinitis; Noninfectious lymphedema; Undiagnosed cardiac murmurs; Mitral stenosis; Morbid obesity ([place]); Depressive disorder; Menopausal symptoms; Mitral and aortic heart valve diseases, unspecified (March 2016); Vitamin D deficiency; HTN (hypertension); Hyperlipidemia; CAO (chronic airflow obstruction) ([place]); Gout; Hypertriglyceridemia; Sleep apnea; Kidney disease; Anxiety; Arthritis; Shortness of breath; Aortic stenosis; and Renal insufficiency.   Presented with worsening shortness of breath. Patient is being followed by cardiology she status post mechanical MVR as well as mechanical AVR at [facility] in March 2016 on Coumadin. Patient is known history of a flatter patient has required in the past cardioversion. Patient have had some persistent shortness of breath as has been also followed up by pulmonology she has persistent hypoxemia requires oxygen and home up to 3 L. Reports while at the doctors office her oxygenation had dropped  into 70's on ambulation Patient presented today again to cardiology due to ongoing shortness of breath and increasing weight. Blood work was done in the office that showed hemoglobin was 7.9 from baseline of 9. Patient was instructed to present to emergency department. In ER patient was found to be Hemoccult negative from below repeat hemoglobin was 7.6 anemia panel has been ordered. She denies any fever no chest pain. She reports no cough CT of the chest was done showing no evidence of PE but possibly mild bronchopneumonia  HOSPITAL COURSE:   . CAP (community acquired pneumonia) - status post treatment with Rocephin/azithromycin. 3 days, not discharged on Omnicef and azithromycin 5 days  Status post-aortic/mitral valve replacement, on 08/06/2014 where she underwent Aortic valve replacement 59mm StJude Regent valve, mitral valve replacement 76mm StJude mechanical valve finding rheumatic disease. At [facility] continue anticoagulation with Coumadin, needs close INR monitoring in 2-3 days   Hilar/mediastinal adenopathy-patient would need outpatient PET scan in 2-3 weeks after resolution of her pneumonia  . Essential hypertension - continue home medications  . Symptomatic anemia - status. post 2 units, negative Hemoccult , possible the patient has chronic GI blood loss., also on anticoagulation, secondary to aortic/mitral valve repair. She will need   gastroenterology referral for a full workup for her anemia   . Chronic asthmatic bronchitis (HCC) currently does not appear to be wheezing but mild crackles noted . Marland Kitchen Patient initially required 3 L of oxygen, currently 100% on room air  . CKD (chronic kidney disease) stage 3, GFR 30-59 ml/min - chronic continue to monitor  Morbid obesity,Body mass index is 48.61 kg/(m^2).   Discharge Exam:    Blood pressure 101/66, pulse 61, temperature 97.7 F (36.5 C), temperature source Oral, resp. rate 20, height $RemoveBe'5\' 6"'ASizxIMGe$  (1.676 m), weight  136.533 kg (301 lb), SpO2 100 %.  General: No acute respiratory distress Lungs: Clear to auscultation bilaterally without wheezes or crackles Cardiovascular: Regular rate and rhythm without murmur gallop or rub normal S1 and S2 Abdomen: Nontender, nondistended, soft, bowel sounds positive, no rebound, no ascites, no appreciable mass Extremities: No significant cyanosis, clubbing, or edema bilateral lower extremities     Follow-up Information    Follow up with [name], FNP. Schedule an appointment as soon as possible for a visit in 3 days.   Specialty:  Nurse Practitioner   Why:  APPOINTMENT: Tuesday, 04-15-15 @ 3pm   Contact information:   [place] [id number] [phone]       Signed: Reyne Dumas 04/10/2015, 10:30 AM  Time spent >45 mins   

## 2015-04-10 NOTE — Evaluation (Signed)
Occupational Therapy Evaluation & Discharge Patient Details Name: [name] MRN: [id number] DOB: [birth year] Today's Date: 04/10/2015    History of Present Illness 54 y.o. female presenting with worsening SOB with exertion. PMH: s/p mechanical MVR as well as mechanical AVR at [facility] in March 2016, noninfectious lymphedema, undiagnosed cardiac murmurs, mitral stenosis, Morbid obesity and HTN.   Clinical Impression   Pt admitted to hospital due to reason stated above. Pt currently with functional limitiations due to the deficits listed below (see OT problem list). Prior to admission pt was independent with ADLs/IADLs and driving, pt currently at baseline for ADLs. Pt reports attending cardiac rehab prior to admission and hopes to continue once discharge from hospital. Pt educated in energy conservation techniques to increase safety with ADLs/IADLs. All education complete, pt aware of limitations due to SOB with exertion, states "I will continue to take my time and complete activities at my own pace".    Follow Up Recommendations  No OT follow up    Equipment Recommendations  None recommended by OT    Recommendations for Other Services       Precautions / Restrictions Precautions Precautions: Fall Restrictions Weight Bearing Restrictions: No      Mobility Bed Mobility               General bed mobility comments: Pt up in chair upon arrival  Transfers Overall transfer level: Independent Equipment used: None             General transfer comment: Noted pt used armrest when performing sit to stand from chair, however did not require any UE support when performing sit to stand from toilet    Balance Overall balance assessment: Needs assistance Sitting-balance support: No upper extremity supported;Feet supported Sitting balance-Leahy Scale: Good     Standing balance support: No upper extremity supported Standing balance-Leahy Scale: Good                               ADL Overall ADL's : Independent                                       General ADL Comments: Pt educated in energy conservation strategies to assist with ADL safety as well as provided with handout to ensure understanding of same. Recommended pt sit to complete all ADL activities and to stand only when apprioprate, pt agreeable to therapist recommendation and reports "SOB only with exertion".     Vision     Perception     Praxis      Pertinent Vitals/Pain Pain Assessment: No/denies pain     Hand Dominance Right   Extremity/Trunk Assessment Upper Extremity Assessment Upper Extremity Assessment: Overall WFL for tasks assessed   Lower Extremity Assessment Lower Extremity Assessment: Defer to PT evaluation   Cervical / Trunk Assessment Cervical / Trunk Assessment: Normal   Communication Communication Communication: No difficulties   Cognition Arousal/Alertness: Awake/alert Behavior During Therapy: WFL for tasks assessed/performed Overall Cognitive Status: Within Functional Limits for tasks assessed                     General Comments       Exercises       Shoulder Instructions      Home Living Family/patient expects to be discharged to:: Private residence Living Arrangements: Other relatives Available Help  at Discharge: Family;Available PRN/intermittently Type of Home: Apartment Home Access: Level entry     Home Layout: One level     Bathroom Shower/Tub: Teacher, early years/pre: Standard Bathroom Accessibility: No   Home Equipment: Hand held shower head   Additional Comments: Pt currently drives, and reports she was attended cardiac rehab prior 3 times a week prior to admission.      Prior Functioning/Environment Level of Independence: Independent        Comments: Pt is on 3L of O2 on an as needed basis    OT Diagnosis: Generalized weakness;Acute pain   OT Problem List: Decreased activity  tolerance;Decreased knowledge of use of DME or AE;Cardiopulmonary status limiting activity   OT Treatment/Interventions:      OT Goals(Current goals can be found in the care plan section) Acute Rehab OT Goals Patient Stated Goal: to go home today  OT Frequency:     Barriers to D/C:            Co-evaluation              End of Session Equipment Utilized During Treatment: Gait belt  Activity Tolerance: Patient tolerated treatment well Patient left: in chair;with call bell/phone within reach   Time: [id number] OT Time Calculation (min): 11 min Charges:  OT General Charges $OT Visit: 1 Procedure OT Evaluation $Initial OT Evaluation Tier I: 1 Procedure G-Codes:    Lin Landsman 05-10-15, 10:10 AM

## 2015-04-11 ENCOUNTER — Telehealth (HOSPITAL_COMMUNITY): Payer: Self-pay | Admitting: *Deleted

## 2015-04-11 ENCOUNTER — Encounter (HOSPITAL_COMMUNITY): Payer: Medicare Other

## 2015-04-11 NOTE — Telephone Encounter (Signed)
-----   Message from [name], NP sent at 04/11/2015  8:25 AM EST ----- Regarding: RE: When may patient return to cardiac rehab Ok to return to cardiac rehab if she desires next week.   She will need further evaluation by pulmonary and needs to see GI.   Not clear if she was referred to GI -   Danielle - can you refer on to [facility] for anemia - possible EGD/colonoscopy with 2 mechanical valves on warfarin.   Thanks Cecille Rubin ----- Message -----    From: Rowe Pavy, RN    Sent: 04/10/2015   3:06 PM      To: [name], NP Subject: When may patient return to cardiac rehab        Cecille Rubin,  Thanks you so very much for seeing this patient on Monday.  I understand she was admitted with anemia.  Pt discharged on today 11/10.  Looks like she will need some additional work up for anemia and PET scan for lymphadenopathy.  F/u with primary MD.When may she return back to rehab?  Thanks Carlette

## 2015-04-14 ENCOUNTER — Encounter (HOSPITAL_COMMUNITY): Payer: Medicare Other

## 2015-04-15 ENCOUNTER — Ambulatory Visit (INDEPENDENT_AMBULATORY_CARE_PROVIDER_SITE_OTHER): Payer: Medicare Other | Admitting: *Deleted

## 2015-04-15 ENCOUNTER — Telehealth: Payer: Self-pay | Admitting: *Deleted

## 2015-04-15 ENCOUNTER — Other Ambulatory Visit: Payer: Self-pay | Admitting: *Deleted

## 2015-04-15 ENCOUNTER — Ambulatory Visit (INDEPENDENT_AMBULATORY_CARE_PROVIDER_SITE_OTHER)
Admission: RE | Admit: 2015-04-15 | Discharge: 2015-04-15 | Disposition: A | Discharge status: Home / Self Care | Payer: Medicare Other | Source: Ambulatory Visit | Attending: Nurse Practitioner | Admitting: Nurse Practitioner

## 2015-04-15 ENCOUNTER — Ambulatory Visit: Payer: Medicare Other | Admitting: Nurse Practitioner

## 2015-04-15 DIAGNOSIS — I350 Nonrheumatic aortic (valve) stenosis: Secondary | ICD-10-CM

## 2015-04-15 DIAGNOSIS — D62 Acute posthemorrhagic anemia: Secondary | ICD-10-CM

## 2015-04-15 DIAGNOSIS — R918 Other nonspecific abnormal finding of lung field: Secondary | ICD-10-CM

## 2015-04-15 DIAGNOSIS — J189 Pneumonia, unspecified organism: Secondary | ICD-10-CM | POA: Diagnosis not present

## 2015-04-15 DIAGNOSIS — J849 Interstitial pulmonary disease, unspecified: Secondary | ICD-10-CM

## 2015-04-15 DIAGNOSIS — Z952 Presence of prosthetic heart valve: Secondary | ICD-10-CM

## 2015-04-15 DIAGNOSIS — R0600 Dyspnea, unspecified: Secondary | ICD-10-CM | POA: Diagnosis not present

## 2015-04-15 DIAGNOSIS — D500 Iron deficiency anemia secondary to blood loss (chronic): Secondary | ICD-10-CM | POA: Diagnosis not present

## 2015-04-15 DIAGNOSIS — I4892 Unspecified atrial flutter: Secondary | ICD-10-CM

## 2015-04-15 LAB — POCT INR: INR: 2.6

## 2015-04-15 NOTE — Telephone Encounter (Signed)
S/w pt is aware pt can start cardiac rehab back next week. Pt stated las day is this Friday. Also pt aware GI, [facility] was sent a referral and our office will be calling pt for colonoscopy/EGD for anemia.  Also pt was referred to pulmonology but pt has appointment coming up for PFT's. Pt is agreeable to plan.

## 2015-04-16 ENCOUNTER — Encounter (HOSPITAL_COMMUNITY): Admission: RE | Admit: 2015-04-16 | Payer: Medicare Other | Source: Ambulatory Visit

## 2015-04-16 ENCOUNTER — Telehealth (HOSPITAL_COMMUNITY): Payer: Self-pay | Admitting: *Deleted

## 2015-04-16 NOTE — Telephone Encounter (Signed)
Returned call from message left.  Pt would like to discharge from the cardiac rehab program and not finish on Friday.  Pt with upcoming procedures and testing and feels schedule wise she will not be able to continue to exercise. Pt has upcoming PFT on 11/28.  Pt will bring the parking badge access card in then.  Pt wished good health and will be missed her in rehab. Cherre Huger, BSN

## 2015-04-18 ENCOUNTER — Encounter (HOSPITAL_COMMUNITY): Payer: Medicare Other

## 2015-04-21 ENCOUNTER — Encounter (HOSPITAL_COMMUNITY): Payer: Medicare Other

## 2015-04-22 ENCOUNTER — Other Ambulatory Visit: Payer: Self-pay

## 2015-04-22 DIAGNOSIS — Z1231 Encounter for screening mammogram for malignant neoplasm of breast: Secondary | ICD-10-CM

## 2015-04-23 ENCOUNTER — Ambulatory Visit: Payer: Medicare Other | Admitting: Nurse Practitioner

## 2015-04-28 ENCOUNTER — Ambulatory Visit (HOSPITAL_COMMUNITY)
Admission: RE | Admit: 2015-04-28 | Discharge: 2015-04-28 | Disposition: A | Discharge status: Home / Self Care | Payer: Medicare Other | Source: Ambulatory Visit | Attending: Nurse Practitioner | Admitting: Nurse Practitioner

## 2015-04-28 DIAGNOSIS — I4892 Unspecified atrial flutter: Secondary | ICD-10-CM | POA: Diagnosis not present

## 2015-04-28 DIAGNOSIS — Z954 Presence of other heart-valve replacement: Secondary | ICD-10-CM | POA: Diagnosis not present

## 2015-04-28 DIAGNOSIS — R918 Other nonspecific abnormal finding of lung field: Secondary | ICD-10-CM | POA: Diagnosis not present

## 2015-04-28 DIAGNOSIS — J849 Interstitial pulmonary disease, unspecified: Secondary | ICD-10-CM | POA: Insufficient documentation

## 2015-04-28 DIAGNOSIS — R0600 Dyspnea, unspecified: Secondary | ICD-10-CM

## 2015-04-28 DIAGNOSIS — R9389 Abnormal findings on diagnostic imaging of other specified body structures: Secondary | ICD-10-CM

## 2015-04-28 DIAGNOSIS — Z952 Presence of prosthetic heart valve: Secondary | ICD-10-CM

## 2015-04-28 LAB — PULMONARY FUNCTION TEST
DL/VA % pred: 68 %
DL/VA: 3.3 ml/min/mmHg/L
DLCO unc % pred: 54 %
DLCO unc: 13.18 ml/min/mmHg
FEF 25-75 Post: 1.31 L/sec
FEF 25-75 Pre: 1 L/sec
FEF2575-%Change-Post: 31 %
FEF2575-%Pred-Post: 51 %
FEF2575-%Pred-Pre: 39 %
FEV1-%Change-Post: 7 %
FEV1-%Pred-Post: 65 %
FEV1-%Pred-Pre: 60 %
FEV1-Post: 1.74 L
FEV1-Pre: 1.62 L
FEV1FVC-%Change-Post: 9 %
FEV1FVC-%Pred-Pre: 88 %
FEV6-%Change-Post: -2 %
FEV6-%Pred-Post: 68 %
FEV6-%Pred-Pre: 69 %
FEV6-Post: 2.26 L
FEV6-Pre: 2.31 L
FEV6FVC-%Change-Post: 0 %
FEV6FVC-%Pred-Post: 102 %
FEV6FVC-%Pred-Pre: 102 %
FVC-%Change-Post: -1 %
FVC-%Pred-Post: 66 %
FVC-%Pred-Pre: 67 %
FVC-Post: 2.27 L
FVC-Pre: 2.32 L
Post FEV1/FVC ratio: 77 %
Post FEV6/FVC ratio: 99 %
Pre FEV1/FVC ratio: 70 %
Pre FEV6/FVC Ratio: 100 %
RV % pred: 114 %
RV: 2.17 L
TLC % pred: 90 %
TLC: 4.58 L

## 2015-04-28 MED ORDER — ALBUTEROL SULFATE (2.5 MG/3ML) 0.083% IN NEBU
2.5000 mg | INHALATION_SOLUTION | Freq: Once | RESPIRATORY_TRACT | Status: AC
  Administered 2015-04-28: 2.5 mg via RESPIRATORY_TRACT

## 2015-04-30 ENCOUNTER — Ambulatory Visit: Payer: Medicare Other | Admitting: Nurse Practitioner

## 2015-05-02 ENCOUNTER — Encounter: Payer: Self-pay | Admitting: Pulmonary Disease

## 2015-05-02 ENCOUNTER — Ambulatory Visit (INDEPENDENT_AMBULATORY_CARE_PROVIDER_SITE_OTHER): Payer: Medicare Other | Admitting: Pulmonary Disease

## 2015-05-02 VITALS — BP 134/80 | HR 64 | Ht 66.0 in | Wt 298.0 lb

## 2015-05-02 DIAGNOSIS — I350 Nonrheumatic aortic (valve) stenosis: Secondary | ICD-10-CM | POA: Diagnosis not present

## 2015-05-02 DIAGNOSIS — D649 Anemia, unspecified: Secondary | ICD-10-CM

## 2015-05-02 DIAGNOSIS — R042 Hemoptysis: Secondary | ICD-10-CM | POA: Diagnosis not present

## 2015-05-02 DIAGNOSIS — R9389 Abnormal findings on diagnostic imaging of other specified body structures: Secondary | ICD-10-CM

## 2015-05-02 DIAGNOSIS — R0489 Hemorrhage from other sites in respiratory passages: Secondary | ICD-10-CM

## 2015-05-02 DIAGNOSIS — J849 Interstitial pulmonary disease, unspecified: Secondary | ICD-10-CM

## 2015-05-02 DIAGNOSIS — J309 Allergic rhinitis, unspecified: Secondary | ICD-10-CM

## 2015-05-02 DIAGNOSIS — R938 Abnormal findings on diagnostic imaging of other specified body structures: Secondary | ICD-10-CM

## 2015-05-02 NOTE — Patient Instructions (Signed)
Cancel your other appointments with [place] We will order a lung function test in May 2017 and see you after that Use albuterol as needed for shortness of breath For the sinuses: I recommend Use Neil Med rinses with distilled water at least twice per day using the instructions on the package. 1/2 hour after using the [facility] Med rinse, use Nasacort two puffs in each nostril once per day.  Remember that the Nasacort can take 1-2 weeks to work after regular use. Use generic zyrtec (cetirizine) every day.  If this doesn't help, then stop taking it and use chlorpheniramine-phenylephrine combination tablets.  We will see you in May

## 2015-05-02 NOTE — Assessment & Plan Note (Signed)
She was given the following instructions  Use Milta Deiters Med rinses with distilled water at least twice per day using the instructions on the package. 1/2 hour after using the [facility] Med rinse, use Nasacort two puffs in each nostril once per day.  Remember that the Nasacort can take 1-2 weeks to work after regular use. Use generic zyrtec (cetirizine) every day.  If this doesn't help, then stop taking it and use chlorpheniramine-phenylephrine combination tablets.

## 2015-05-02 NOTE — Progress Notes (Signed)
Subjective:    Patient ID: [name], female    DOB: 1960/11/12, 54 y.o.   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 LN but noncontrast, +++mosaicism  Echo 11/2010:  Normal EF, mod dilatation LA, mild to mod MR, mod AI, mod AS.  HP panel 07/2012:  Normal  Bronch 09/2012:  Culture + Hflu, benign lung tissue on biopsy.   PFTs 07/2012:  No obstruction by spiro, ++ airtrapping on lung volumes, no restriction, DLCO 75% Thoracic surg eval 10/2012:  Pt decided against bx, and never followed up.  F/u 07/2013 with persistent doe >> f/u ct chest with persistent diffuse micronodularity and mosaicism VATS bx 09/2013:  Large numbers of pigmented macrophages filling alveoli with dark brown granules c/w hemosiderin?  No vasculitis or acute inflammation, minimal subpleural fibrosis (reviewed by Dian Situ) Pt quit smoking Autoimmune evaluation 09/2013:  Negative Start prednisone trial 40mg  to 30mg  over 4 weeks, then to 20mg  over 4 weeks, then 15mg /day F/u ct chest with only small change, but pt still with doe.   01/2014 pt d/ced steroids on her own. Echo 12/2013:  Moderate AS with decreased peak gradient from prior, mild MS  S/p Aortic and MV replacement 07/2014 at [facility] Chief Complaint  Patient presents with  . Follow-up    pt c/o sob with exertion.  pt had ct chest and pft within the past month.     [name] was admitted to the hospital earlier this month for anemia. Apparently they think she has a GI bleed of some sort but they have yet to identify the cause. She was treated with 2 units of blood and she says she's feeling much better since then. She says that since having the blood transfusion her breathing has improved dramatically. She does not have a cough. She is complaining mostly today of allergic rhinitis symptoms. She has not had any bleeding in her stool that she's noticed. She denies  significant cough or any mucus production. She denies chest pain. She says that her legs are not swollen with the exception of chronic lymphedema in the right leg.   Past Medical History  Diagnosis Date  . Allergic rhinitis   . Noninfectious lymphedema   . Undiagnosed cardiac murmurs   . Mitral stenosis   . Morbid obesity ([place])   . Depressive disorder   . Menopausal symptoms   . Mitral and aortic heart valve diseases, unspecified March 2016    s/p AVR with #19 St Jude and s/p MVR with 18mm St. Jude per [name] at [facility]  . Vitamin D deficiency   . HTN (hypertension)   . Hyperlipidemia   . CAO (chronic airflow obstruction) (HCC)   . Gout   . Hypertriglyceridemia   . Sleep apnea   . Kidney disease     Dr. Graylon Gunning  . Anxiety   . Arthritis   . Shortness of breath     with exertion  . Aortic stenosis   . Renal insufficiency       Review of Systems  Constitutional: Negative for fever, chills and fatigue.  HENT: Negative for nosebleeds, postnasal drip and rhinorrhea.   Respiratory: Negative for cough, shortness of breath and wheezing.   Cardiovascular: Positive for leg swelling. Negative for chest pain and palpitations.       Objective:   Physical Exam Filed Vitals:   05/02/15 1507  BP:  134/80  Pulse: 64  Height: 5\' 6"  (1.676 m)  Weight: 298 lb (135.172 kg)  SpO2: 99%  RA   Gen: morbidly obese, but well appearing HENT: OP clear, TM's clear, neck supple PULM: CTA B, normal percussion CV: RRR, mechanical S1/S2, chronic right leg edema GI: BS+, soft, nontender Derm: no cyanosis or rash Psyche: normal mood and affect  November 2016 CT chest images personally reviewed, see my description below October 2016 hospital discharge summary reviewed where she was evaluated for anemia and given a transfusion.       Assessment & Plan:  Intra-alveolar hemorrhage I have personally reviewed the images from her CT chest from November 2016 which showed mild centrilobular  ground glass throughout both lungs bilaterally with some changes in the left base consistent with her prior lung biopsy. I also took the time today to compare these to the study earlier in November 2016 as well as her 2015 CT chest. I cannot appreciate any significant change compared to 2015 which in her case is a good thing. Her lung function testing from 4 days ago were also reviewed today in clinic which showed no airflow obstruction no restriction and a mild reduction in her diffusion capacity which no doubt is due to some degree of interstitial inflammation and her anemia.  Her pathology report from May 2015 showed nothing but alveolar hemorrhage. There are patients which can develop something called "brown induration of the lung". Which is an inflammatory process from chronic alveolar inflammation and capillary leak. I think this is likely what's happened to her. I do not anticipate a dramatic improvement in her CT chest over the years but I also do not anticipate worsening now that she has had a valve replacement.  She has come off of oxygen therapy which is great. Plan: We will continue surveillance with with a pulmonary function tests on a six-month basis for the next few years. I encouraged her to remain active and to lose weight We will see her back in 6 months  Greater than 50% of face-to-face time spent and a 25 minute visit today.  Aortic stenosis Per cardiology  Symptomatic anemia I agree with plans for endoscopy  Allergic rhinitis She was given the following instructions  Use Neil Med rinses with distilled water at least twice per day using the instructions on the package. 1/2 hour after using the [facility] Med rinse, use Nasacort two puffs in each nostril once per day.  Remember that the Nasacort can take 1-2 weeks to work after regular use. Use generic zyrtec (cetirizine) every day.  If this doesn't help, then stop taking it and use chlorpheniramine-phenylephrine combination  tablets.       Current outpatient prescriptions:  .  acetaminophen (TYLENOL) 650 MG CR tablet, Take 1,300 mg by mouth 2 (two) times daily as needed for pain. , Disp: , Rfl:  .  albuterol (PROVENTIL HFA;VENTOLIN HFA) 108 (90 BASE) MCG/ACT inhaler, Inhale 2 puffs into the lungs 2 (two) times daily as needed for wheezing or shortness of breath., Disp: , Rfl:  .  albuterol (PROVENTIL) (2.5 MG/3ML) 0.083% nebulizer solution, Inhale 2.5 mg into the lungs every 4 (four) hours as needed., Disp: , Rfl:  .  aspirin EC 81 MG tablet, Take 81 mg by mouth daily., Disp: , Rfl:  .  budesonide-formoterol (SYMBICORT) 80-4.5 MCG/ACT inhaler, Inhale 2 puffs into the lungs 2 (two) times daily., Disp: , Rfl:  .  buPROPion (WELLBUTRIN XL) 300 MG 24 hr tablet, Take 300  mg by mouth daily. , Disp: , Rfl:  .  Cholecalciferol (VITAMIN D3) 2000 UNITS TABS, Take 2,000 Units by mouth daily. , Disp: , Rfl:  .  citalopram (CELEXA) 20 MG tablet, Take 20 mg by mouth daily. , Disp: , Rfl:  .  furosemide (LASIX) 40 MG tablet, Take 1 tablet (40 mg total) by mouth 2 (two) times daily., Disp: 30 tablet, Rfl: 0 .  metoprolol tartrate (LOPRESSOR) 25 MG tablet, Take 25 mg by mouth 2 (two) times daily. , Disp: , Rfl:  .  montelukast (SINGULAIR) 10 MG tablet, Take 10 mg by mouth daily. , Disp: , Rfl:  .  NON FORMULARY, Place 2.5 L into the nose continuous as needed (sob). , Disp: , Rfl:  .  potassium chloride SA (K-DUR,KLOR-CON) 20 MEQ tablet, Take 20 mEq by mouth 2 (two) times daily., Disp: , Rfl:  .  pravastatin (PRAVACHOL) 80 MG tablet, Take 80 mg by mouth daily. , Disp: , Rfl:  .  warfarin (COUMADIN) 7.5 MG tablet, TAKE AS DIRECTED BY ANTICOAGULATION CLINIC, Disp: 30 tablet, Rfl: 3 .  warfarin (COUMADIN) 7.5 MG tablet, Take 3.75-7.5 mg by mouth See admin instructions. Take 1 tablet every day except take 1/2 tablet on Mondays, Disp: , Rfl:

## 2015-05-02 NOTE — Assessment & Plan Note (Addendum)
I have personally reviewed the images from her CT chest from November 2016 which showed mild centrilobular ground glass throughout both lungs bilaterally with some changes in the left base consistent with her prior lung biopsy. I also took the time today to compare these to the study earlier in November 2016 as well as her 2015 CT chest. I cannot appreciate any significant change compared to 2015 which in her case is a good thing. Her lung function testing from 4 days ago were also reviewed today in clinic which showed no airflow obstruction no restriction and a mild reduction in her diffusion capacity which no doubt is due to some degree of interstitial inflammation and her anemia.  Her pathology report from May 2015 showed nothing but alveolar hemorrhage. There are patients which can develop something called "brown induration of the lung". Which is an inflammatory process from chronic alveolar inflammation and capillary leak. I think this is likely what's happened to her. I do not anticipate a dramatic improvement in her CT chest over the years but I also do not anticipate worsening now that she has had a valve replacement.  She has come off of oxygen therapy which is great. Plan: We will continue surveillance with with a pulmonary function tests on a six-month basis for the next few years. I encouraged her to remain active and to lose weight We will see her back in 6 months  Greater than 50% of face-to-face time spent and a 25 minute visit today.

## 2015-05-02 NOTE — Assessment & Plan Note (Signed)
Per cardiology 

## 2015-05-02 NOTE — Assessment & Plan Note (Signed)
I agree with plans for endoscopy

## 2015-05-08 ENCOUNTER — Ambulatory Visit
Admission: RE | Admit: 2015-05-08 | Discharge: 2015-05-08 | Disposition: A | Discharge status: Home / Self Care | Payer: Medicare Other | Source: Ambulatory Visit

## 2015-05-08 ENCOUNTER — Inpatient Hospital Stay: Payer: Medicare Other | Admitting: Adult Health

## 2015-05-08 DIAGNOSIS — D649 Anemia, unspecified: Secondary | ICD-10-CM | POA: Diagnosis not present

## 2015-05-08 DIAGNOSIS — Z1231 Encounter for screening mammogram for malignant neoplasm of breast: Secondary | ICD-10-CM

## 2015-05-13 ENCOUNTER — Ambulatory Visit: Payer: Medicare Other | Admitting: Pulmonary Disease

## 2015-05-13 ENCOUNTER — Ambulatory Visit (INDEPENDENT_AMBULATORY_CARE_PROVIDER_SITE_OTHER): Payer: Medicare Other | Admitting: *Deleted

## 2015-05-13 DIAGNOSIS — I350 Nonrheumatic aortic (valve) stenosis: Secondary | ICD-10-CM

## 2015-05-13 LAB — POCT INR: INR: 2.3

## 2015-05-24 ENCOUNTER — Telehealth: Payer: Self-pay | Admitting: Internal Medicine

## 2015-05-24 MED ORDER — PREDNISONE 10 MG PO TABS: ORAL_TABLET | ORAL | Status: DC

## 2015-05-24 NOTE — Telephone Encounter (Signed)
On call-  Pt reports 3 days increased wheezing dyspnea not controlled with her albuterol. She doesn't feel either infection or fluid overloaded. Plan prednisone taper called to drug store.

## 2015-05-28 DIAGNOSIS — D649 Anemia, unspecified: Secondary | ICD-10-CM | POA: Diagnosis not present

## 2015-06-12 DIAGNOSIS — N189 Chronic kidney disease, unspecified: Secondary | ICD-10-CM | POA: Diagnosis not present

## 2015-06-12 DIAGNOSIS — N2581 Secondary hyperparathyroidism of renal origin: Secondary | ICD-10-CM | POA: Diagnosis not present

## 2015-06-12 DIAGNOSIS — N183 Chronic kidney disease, stage 3 (moderate): Secondary | ICD-10-CM | POA: Diagnosis not present

## 2015-06-19 ENCOUNTER — Ambulatory Visit (INDEPENDENT_AMBULATORY_CARE_PROVIDER_SITE_OTHER): Payer: Medicare Other | Admitting: *Deleted

## 2015-06-19 DIAGNOSIS — N2581 Secondary hyperparathyroidism of renal origin: Secondary | ICD-10-CM | POA: Diagnosis not present

## 2015-06-19 DIAGNOSIS — I350 Nonrheumatic aortic (valve) stenosis: Secondary | ICD-10-CM | POA: Diagnosis not present

## 2015-06-19 DIAGNOSIS — N183 Chronic kidney disease, stage 3 (moderate): Secondary | ICD-10-CM | POA: Diagnosis not present

## 2015-06-19 DIAGNOSIS — D631 Anemia in chronic kidney disease: Secondary | ICD-10-CM | POA: Diagnosis not present

## 2015-06-19 DIAGNOSIS — I129 Hypertensive chronic kidney disease with stage 1 through stage 4 chronic kidney disease, or unspecified chronic kidney disease: Secondary | ICD-10-CM | POA: Diagnosis not present

## 2015-06-19 LAB — POCT INR: INR: 4

## 2015-06-20 ENCOUNTER — Other Ambulatory Visit: Payer: Self-pay | Admitting: Gastroenterology

## 2015-06-20 ENCOUNTER — Ambulatory Visit (INDEPENDENT_AMBULATORY_CARE_PROVIDER_SITE_OTHER)
Admission: RE | Admit: 2015-06-20 | Discharge: 2015-06-20 | Disposition: A | Discharge status: Home / Self Care | Payer: Medicare Other | Source: Ambulatory Visit | Attending: Acute Care | Admitting: Acute Care

## 2015-06-20 ENCOUNTER — Encounter: Payer: Self-pay | Admitting: Acute Care

## 2015-06-20 ENCOUNTER — Ambulatory Visit (INDEPENDENT_AMBULATORY_CARE_PROVIDER_SITE_OTHER): Payer: Medicare Other | Admitting: Acute Care

## 2015-06-20 VITALS — BP 114/70 | HR 62 | Temp 97.0°F | Ht 66.5 in | Wt 305.6 lb

## 2015-06-20 DIAGNOSIS — R0602 Shortness of breath: Secondary | ICD-10-CM | POA: Diagnosis not present

## 2015-06-20 DIAGNOSIS — J849 Interstitial pulmonary disease, unspecified: Secondary | ICD-10-CM

## 2015-06-20 DIAGNOSIS — R0600 Dyspnea, unspecified: Secondary | ICD-10-CM | POA: Diagnosis not present

## 2015-06-20 DIAGNOSIS — D509 Iron deficiency anemia, unspecified: Secondary | ICD-10-CM

## 2015-06-20 DIAGNOSIS — J309 Allergic rhinitis, unspecified: Secondary | ICD-10-CM

## 2015-06-20 DIAGNOSIS — R0609 Other forms of dyspnea: Secondary | ICD-10-CM | POA: Diagnosis not present

## 2015-06-20 MED ORDER — TRIAMCINOLONE ACETONIDE 55 MCG/ACT NA AERO: 2.0000 | INHALATION_SPRAY | Freq: Every day | NASAL | Status: DC

## 2015-06-20 MED ORDER — CETIRIZINE HCL 10 MG PO TABS: 10.0000 mg | ORAL_TABLET | Freq: Every day | ORAL | Status: DC

## 2015-06-20 NOTE — Patient Instructions (Addendum)
We will walk you today to check your saturations with activity. You desaturated on room air to less than 87%. Wear your oxygen with activity at 2-3 liters. We will place a consult today with [employer] to evaluate you for portable oxygen concentrator. We will check a chest x ray today and call you with results Please make sure you are taking your maintenance medications as prescribed. Please wear your oxygen as prescribed.  Use your SYMBICORT) 80-4.5 MCG/ACT inhaler, Inhale 2 puffs into the lungs 2 (two) times daily, As previously ordered Use your Pro Air  as needed for shortness of breath. This is your rescue drug. Take your (SINGULAIR) 10 MG tablet, Take 10 mg by mouth daily. Use generic zyrtec (cetirizine) every day.  Use Neil Med rinses with distilled water at least twice per day using the instructions on the package. 1/2 hour after using the [facility] Med rinse, use Nasacort two puffs in each nostril once per day. Remember that the Nasacort can take 1-2 weeks to work after regular use. Follow up with Dr. Lake [name] in 1 month. Please contact office for sooner follow up if symptoms do not improve or worsen or seek emergency care

## 2015-06-20 NOTE — Progress Notes (Addendum)
Subjective:    Patient ID: [name], female    DOB: 28-Dec-1960, 55 y.o.   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 Lake [name]   TEST : CT chest 07/2012: inumerable micronodules bilat in LL, no signif. LN but noncontrast, +++mosaicism  Echo 11/2010: Normal EF, mod dilatation LA, mild to mod MR, mod AI, mod AS.  HP panel 07/2012: Normal  Bronch 09/2012: Culture + Hflu, benign lung tissue on biopsy.  PFTs 07/2012: No obstruction by spiro, ++ airtrapping on lung volumes, no restriction, DLCO 75% Thoracic surg eval 10/2012: Pt decided against bx, and never followed up.  F/u 07/2013 with persistent doe >> f/u ct chest with persistent diffuse micronodularity and mosaicism VATS bx 09/2013: Large numbers of pigmented macrophages filling alveoli with dark brown granules c/w hemosiderin? No vasculitis or acute inflammation, minimal subpleural fibrosis (reviewed by Dian Situ) Pt quit smoking Autoimmune evaluation 09/2013: Negative Start prednisone trial 40mg  to 30mg  over 4 weeks, then to 20mg  over 4 weeks, then 15mg /day F/u ct chest with only small change, but pt still with doe.  01/2014 pt d/ced steroids on her own. Echo 12/2013: Moderate AS with decreased peak gradient from prior, mild MS >> cardiology declined more accurate evaluation despite my urging.  S/p Aortic and MV replacement 07/2014 at [place]   06/20/2015: Acute OV: Patient presents today for an acute office visit. She complains of persistent shortness of breath. Patient was seen one month ago with no changes to her regimen, with plans to do CT's and PFT's every 6 months to monitor her pulmonary function and chronic alveolar inflammation..She did have a prednisone taper at 05/24/15 for 3 days of wheezing. She does not have a cough,she is not wheezing, but she also states she is not using her maintenance medications or her rescue inhaler when she is short of breath.  She does state she is tender in her lower chest.She came today without oxygen. We walked her in the office and she did have desaturation with ambulation to 87%..Chest x ray today shows no acute process.  Past Medical History  Diagnosis Date  . Allergic rhinitis   . Noninfectious lymphedema   . Undiagnosed cardiac murmurs   . Mitral stenosis   . Morbid obesity ([place])   . Depressive disorder   . Menopausal symptoms   . Mitral and aortic heart valve diseases, unspecified March 2016    s/p AVR with #19 St Jude and s/p MVR with 68mm St. Jude per [name] at [facility]  . Vitamin D deficiency   . HTN (hypertension)   . Hyperlipidemia   . CAO (chronic airflow obstruction) (HCC)   . Gout   . Hypertriglyceridemia   . Sleep apnea   . Kidney disease     Dr. Graylon Gunning  . Anxiety   . Arthritis   . Shortness of breath     with exertion  . Aortic stenosis   . Renal insufficiency      Current outpatient prescriptions:  .  acetaminophen (TYLENOL) 650 MG CR tablet, Take 1,300 mg by mouth 2 (two) times daily as needed for pain. , Disp: , Rfl:  .  aspirin EC 81 MG tablet, Take 81 mg by mouth daily., Disp: , Rfl:  .  buPROPion (WELLBUTRIN XL) 300 MG 24 hr tablet, Take 300 mg by mouth daily. , Disp: , Rfl:  .  cetirizine (ZYRTEC) 10 MG tablet, Take 1 tablet (10 mg  total) by mouth daily., Disp: 30 tablet, Rfl: 5 .  Cholecalciferol (VITAMIN D3) 2000 UNITS TABS, Take 2,000 Units by mouth daily. , Disp: , Rfl:  .  citalopram (CELEXA) 20 MG tablet, Take 20 mg by mouth daily. , Disp: , Rfl:  .  furosemide (LASIX) 40 MG tablet, Take 1 tablet (40 mg total) by mouth 2 (two) times daily., Disp: 30 tablet, Rfl: 0 .  metoprolol succinate (TOPROL-XL) 25 MG 24 hr tablet, Take 1 tablet by mouth 2 (two) times daily., Disp: , Rfl:  .  metoprolol tartrate (LOPRESSOR) 25 MG tablet, Take 25 mg by mouth 2 (two) times daily. , Disp: , Rfl:  .  montelukast (SINGULAIR) 10 MG tablet, Take 10 mg by mouth daily. , Disp: , Rfl:  .   NON FORMULARY, Place 2.5 L into the nose continuous as needed (sob). , Disp: , Rfl:  .  potassium chloride SA (K-DUR,KLOR-CON) 20 MEQ tablet, Take 20 mEq by mouth 2 (two) times daily., Disp: , Rfl:  .  pravastatin (PRAVACHOL) 80 MG tablet, Take 80 mg by mouth daily. , Disp: , Rfl:  .  warfarin (COUMADIN) 7.5 MG tablet, TAKE AS DIRECTED BY ANTICOAGULATION CLINIC, Disp: 30 tablet, Rfl: 3 .  albuterol (PROVENTIL HFA;VENTOLIN HFA) 108 (90 BASE) MCG/ACT inhaler, Inhale 2 puffs into the lungs 2 (two) times daily as needed for wheezing or shortness of breath. Reported on 06/20/2015, Disp: , Rfl:  .  albuterol (PROVENTIL) (2.5 MG/3ML) 0.083% nebulizer solution, Inhale 2.5 mg into the lungs every 4 (four) hours as needed. Reported on 06/20/2015, Disp: , Rfl:  .  budesonide-formoterol (SYMBICORT) 80-4.5 MCG/ACT inhaler, Inhale 2 puffs into the lungs 2 (two) times daily. Reported on 06/20/2015, Disp: , Rfl:  .  triamcinolone (NASACORT AQ) 55 MCG/ACT AERO nasal inhaler, Place 2 sprays into the nose daily., Disp: 1 Inhaler, Rfl: 5   No Known Allergies Review of Systems   Constitutional:   No  weight loss, night sweats,  Fevers, chills, fatigue, or  lassitude.  HEENT:   No headaches,  Difficulty swallowing,  Tooth/dental problems, or  Sore throat,                No sneezing, itching, ear ache, slight nasal congestion, no post nasal drip,   CV:  No chest pain,  Orthopnea, PND, some swelling in lower extremities, anasarca, dizziness, palpitations, syncope.   GI  No heartburn, indigestion, abdominal pain, nausea, vomiting, diarrhea, change in bowel habits, loss of appetite, bloody stools.   Resp: + shortness of breath with exertion not at rest.  No excess mucus, no productive cough,  No non-productive cough,  No coughing up of blood.  No change in color of mucus.  No wheezing.  No chest wall deformity  Skin: no rash or lesions.  GU: no dysuria, change in color of urine, no urgency or frequency.  No flank pain,  no hematuria   MS:  No joint pain or swelling.  No decreased range of motion.  No back pain.  Psych:  No change in mood or affect. No depression or anxiety.  No memory loss.        Objective:   Physical Exam BP 114/70 mmHg  Pulse 62  Temp(Src) 97 F (36.1 C) (Oral)  Ht 5' 6.5" (1.689 m)  Wt 305 lb 9.6 oz (138.619 kg)  BMI 48.59 kg/m2  SpO2 90%  Physical Exam:  General- No distress,  A&Ox3,pleasant, morbidly obese. ENT: No sinus tenderness, TM clear, pale  nasal mucosa, no oral exudate,no post nasal drip, no LAN Cardiac: S1, S2, regular rate and rhythm, no murmur Chest: No wheeze/ rales/ dullness; no accessory muscle use, no nasal flaring, no sternal retractions Abd.: Soft Non-tender Ext: 1+ edema  Neuro:  normal strength, no focal deficits noted Skin: No rashes, no lesions warm and dry Psych: normal mood and behavior      Assessment & Plan:

## 2015-06-20 NOTE — Assessment & Plan Note (Addendum)
Pt. With dyspnea on exertion, desaturation with ambulation. Corrects quickly with oxygen 2L.She is not taking her maintenance regimen medications for SOB as prescribed. She is also not using her oxygen. She is not wheezing upon auscultation  before or after ambulation.  We will walk you today to check your saturations with activity. You confirmed that you have oxygen tanks at home. You desaturated on room air to less than 87%. Wear your oxygen with activity at 2-3 liters.Please wear your oxygen as prescribed.   We will place a consult today with [employer] to evaluate you for portable oxygen concentrator. Most recent CXR shows no acute process  Please make sure you are taking your maintenance medications as prescribed.  Use your SYMBICORT) 80-4.5 MCG/ACT inhaler, Inhale 2 puffs into the lungs 2 (two) times daily, As previously ordered Use your Pro Air  as needed for shortness of breath. This is your rescue drug. Take your (SINGULAIR) 10 MG tablet, Take 10 mg by mouth daily. Use generic zyrtec (cetirizine) every day.  Please contact office for sooner follow up if symptoms do not improve or worsen or seek emergency care

## 2015-06-20 NOTE — Assessment & Plan Note (Addendum)
Pt. States she continues to have SOB and does not understand why. She is not compliant with the regimen for allergies Dr. Lake [name] prescribed for 12/16.  Plan: Education regarding compliance with preventative maintenance regimen to prevent worsening of pulmonary status. Use generic zyrtec (cetirizine) every day.  Use Neil Med rinses with distilled water at least twice per day using the instructions on the package. 1/2 hour after using the [facility] Med rinse, use Nasacort two puffs in each nostril once per day. Remember that the Nasacort can take 1-2 weeks to work after regular use. Follow up with Dr. Lake [name] in 1 month. Please contact office for sooner follow up if symptoms do not improve or worsen or seek emergency care

## 2015-06-26 ENCOUNTER — Ambulatory Visit: Payer: Medicare Other | Admitting: Pulmonary Disease

## 2015-06-30 ENCOUNTER — Ambulatory Visit
Admission: RE | Admit: 2015-06-30 | Discharge: 2015-06-30 | Disposition: A | Discharge status: Home / Self Care | Payer: Medicare Other | Source: Ambulatory Visit | Attending: Gastroenterology | Admitting: Gastroenterology

## 2015-06-30 DIAGNOSIS — D509 Iron deficiency anemia, unspecified: Secondary | ICD-10-CM

## 2015-07-04 ENCOUNTER — Ambulatory Visit
Admission: RE | Admit: 2015-07-04 | Discharge: 2015-07-04 | Disposition: A | Discharge status: Home / Self Care | Payer: Medicare Other | Source: Ambulatory Visit | Attending: Gastroenterology | Admitting: Gastroenterology

## 2015-07-04 DIAGNOSIS — D509 Iron deficiency anemia, unspecified: Secondary | ICD-10-CM

## 2015-07-04 DIAGNOSIS — K5710 Diverticulosis of small intestine without perforation or abscess without bleeding: Secondary | ICD-10-CM | POA: Diagnosis not present

## 2015-07-09 DIAGNOSIS — J069 Acute upper respiratory infection, unspecified: Secondary | ICD-10-CM | POA: Diagnosis not present

## 2015-07-09 DIAGNOSIS — J849 Interstitial pulmonary disease, unspecified: Secondary | ICD-10-CM | POA: Diagnosis not present

## 2015-07-15 ENCOUNTER — Ambulatory Visit (INDEPENDENT_AMBULATORY_CARE_PROVIDER_SITE_OTHER): Payer: Medicare Other | Admitting: *Deleted

## 2015-07-15 DIAGNOSIS — I350 Nonrheumatic aortic (valve) stenosis: Secondary | ICD-10-CM

## 2015-07-15 LAB — POCT INR: INR: 3.1

## 2015-07-22 ENCOUNTER — Ambulatory Visit (INDEPENDENT_AMBULATORY_CARE_PROVIDER_SITE_OTHER): Payer: Medicare Other | Admitting: Pulmonary Disease

## 2015-07-22 ENCOUNTER — Encounter: Payer: Self-pay | Admitting: Pulmonary Disease

## 2015-07-22 VITALS — BP 122/66 | HR 70 | Ht 66.5 in | Wt 310.0 lb

## 2015-07-22 DIAGNOSIS — R042 Hemoptysis: Secondary | ICD-10-CM

## 2015-07-22 DIAGNOSIS — J45909 Unspecified asthma, uncomplicated: Secondary | ICD-10-CM | POA: Insufficient documentation

## 2015-07-22 DIAGNOSIS — J309 Allergic rhinitis, unspecified: Secondary | ICD-10-CM

## 2015-07-22 DIAGNOSIS — J449 Chronic obstructive pulmonary disease, unspecified: Secondary | ICD-10-CM | POA: Diagnosis not present

## 2015-07-22 DIAGNOSIS — R0600 Dyspnea, unspecified: Secondary | ICD-10-CM | POA: Diagnosis not present

## 2015-07-22 DIAGNOSIS — R0489 Hemorrhage from other sites in respiratory passages: Secondary | ICD-10-CM

## 2015-07-22 NOTE — Progress Notes (Signed)
Subjective:    Patient ID: [name], female    DOB: 08-Apr-1961, 55 y.o.   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 LN but noncontrast, +++mosaicism  Echo 11/2010:  Normal EF, mod dilatation LA, mild to mod MR, mod AI, mod AS.  HP panel 07/2012:  Normal  Bronch 09/2012:  Culture + Hflu, benign lung tissue on biopsy.   PFTs 07/2012:  No obstruction by spiro, ++ airtrapping on lung volumes, no restriction, DLCO 75% Thoracic surg eval 10/2012:  Pt decided against bx, and never followed up.  F/u 07/2013 with persistent doe >> f/u ct chest with persistent diffuse micronodularity and mosaicism VATS bx 09/2013:  Large numbers of pigmented macrophages filling alveoli with dark brown granules c/w hemosiderin?  No vasculitis or acute inflammation, minimal subpleural fibrosis (reviewed by Dian Situ) Pt quit smoking Autoimmune evaluation 09/2013:  Negative Start prednisone trial 40mg  to 30mg  over 4 weeks, then to 20mg  over 4 weeks, then 15mg /day F/u ct chest with only small change, but pt still with doe.   01/2014 pt d/ced steroids on her own. Echo 12/2013:  Moderate AS with decreased peak gradient from prior, mild MS  S/p Aortic and MV replacement 07/2014 at [facility] Chief Complaint  Patient presents with  . Follow-up    pt c/o sob with exertion, no new complaints today.  needs symbicort refill.    [name] started taking iron pills and her dyspnea is better.  She had her oxygen restarted on the last visit. She says that her sinus symptoms are much better. She is only using the oxygen "when she needs it". She is supposed to go back to see GI again. Her dyspnea is definitely better.   She has been taking Symbicort  Past Medical History  Diagnosis Date  . Allergic rhinitis   . Noninfectious lymphedema   . Undiagnosed cardiac murmurs   . Mitral stenosis   . Morbid obesity ([name])   .  Depressive disorder   . Menopausal symptoms   . Mitral and aortic heart valve diseases, unspecified March 2016    s/p AVR with #19 [employer] Jude and s/p MVR with 51mm [employer] Jude per [name] at [facility]  . Vitamin D deficiency   . HTN (hypertension)   . Hyperlipidemia   . CAO (chronic airflow obstruction) (H[facility])   . Gout   . Hypertriglyceridemia   . Sleep apnea   . Kidney disease     Dr. Graylon Gunning  . Anxiety   . Arthritis   . Shortness of breath     with exertion  . Aortic stenosis   . Renal insufficiency       Review of Systems  Constitutional: Negative for fever, chills and fatigue.  HENT: Negative for nosebleeds, postnasal drip and rhinorrhea.   Respiratory: Negative for cough, shortness of breath and wheezing.   Cardiovascular: Positive for leg swelling. Negative for chest pain and palpitations.       Objective:   Physical Exam Filed Vitals:   07/22/15 1442  BP: 122/66  Pulse: 70  Height: 5' 6.5" (1.689 m)  Weight: 310 lb (140.615 kg)  SpO2: 92%  RA   Gen: morbidly obese, but well appearing HENT: OP clear, TM's clear, neck supple PULM: CTA B, normal percussion CV: RRR, mechanical S1/S2, chronic right leg edema GI: BS+, soft, nontender Derm: no cyanosis or rash Psyche: normal mood  and affect  04/28/2015 pulmonary function testing ratio 77%, FEV1 1.74 L (65% predicted), FVC 2.27 L (66% predicted), total lung capacity 4.58 L (90% predicted), DLCO 13.18 (54% predicted). 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 There has been no evidence that I can tell that this has progressed or worsened since her valve surgery. We will continue monitoring with serial lung function testing on  an annual basis. Treating her with steroids didn't seem to make difference about 2 years ago. At this time I continue to plan just expectant therapy.  Plan: Repeat PFT in May  COPD I have first reviewed the data from the 04/28/2015 primary function testing which though the FEV1 to FVC ratio is only 77%, there is clear-cut airflow obstruction based on the shape of the flow volume loop. This fits with the diagnosis of COPD considering her lengthy smoking history.  Plan: Continue Symbicort twice a day Continue to stay away from cigarettes  Allergic rhinitis This has improved significantly since she has been more compliant with her antihistamine and nasal steroid. I encouraged her to continue compliance today.  Dyspnea This is truly a complicated problem. Fortunately it seems to be better recently.  For her, her shortness of breath is likely related to her anemia, obesity, her deconditioning, her allergic rhinitis causing subjective dyspnea, and her COPD. Least of which contributing to her symptoms as this alveolar hemorrhage which she has experienced.  She seems to be better primarily from treating the anemia most recently.  Plan: Continue iron therapy for anemia Repeat PFT in May to ensure there is no evidence of a progressive interstitial lung disease, I doubt it     Current outpatient prescriptions:  .  acetaminophen (TYLENOL) 650 MG CR tablet, Take 1,300 mg by mouth 2 (two) times daily as needed for pain. , Disp: , Rfl:  .  albuterol (PROVENTIL HFA;VENTOLIN HFA) 108 (90 BASE) MCG/ACT inhaler, Inhale 2 puffs into the lungs 2 (two) times daily as needed for wheezing or shortness of breath. Reported on 06/20/2015, Disp: , Rfl:  .  albuterol (PROVENTIL) (2.5 MG/3ML) 0.083% nebulizer solution, Inhale 2.5 mg into the lungs every 4 (four) hours as needed. Reported on 06/20/2015, Disp: , Rfl:  .  aspirin EC 81 MG tablet, Take 81 mg by mouth daily., Disp: , Rfl:  .  budesonide-formoterol  (SYMBICORT) 80-4.5 MCG/ACT inhaler, Inhale 2 puffs into the lungs 2 (two) times daily. Reported on 06/20/2015, Disp: , Rfl:  .  buPROPion (WELLBUTRIN XL) 300 MG 24 hr tablet, Take 300 mg by mouth daily. , Disp: , Rfl:  .  cetirizine (ZYRTEC) 10 MG tablet, Take 1 tablet (10 mg total) by mouth daily., Disp: 30 tablet, Rfl: 5 .  Cholecalciferol (VITAMIN D3) 2000 UNITS TABS, Take 2,000 Units by mouth daily. , Disp: , Rfl:  .  citalopram (CELEXA) 20 MG tablet, Take 20 mg by mouth daily. , Disp: , Rfl:  .  furosemide (LASIX) 40 MG tablet, Take 1 tablet (40 mg total) by mouth 2 (two) times daily., Disp: 30 tablet, Rfl: 0 .  metoprolol tartrate (LOPRESSOR) 25 MG tablet, Take 25 mg by mouth 2 (two) times daily. , Disp: , Rfl:  .  montelukast (SINGULAIR) 10 MG tablet,  Take 10 mg by mouth daily. , Disp: , Rfl:  .  NON FORMULARY, Place 2.5 L into the nose continuous as needed (sob). , Disp: , Rfl:  .  potassium chloride SA (K-DUR,KLOR-CON) 20 MEQ tablet, Take 20 mEq by mouth 2 (two) times daily., Disp: , Rfl:  .  pravastatin (PRAVACHOL) 80 MG tablet, Take 80 mg by mouth daily. , Disp: , Rfl:  .  triamcinolone (NASACORT AQ) 55 MCG/ACT AERO nasal inhaler, Place 2 sprays into the nose daily., Disp: 1 Inhaler, Rfl: 5 .  warfarin (COUMADIN) 7.5 MG tablet, TAKE AS DIRECTED BY ANTICOAGULATION CLINIC, Disp: 30 tablet, Rfl: 3

## 2015-07-22 NOTE — Assessment & Plan Note (Signed)
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 There has been no evidence that I can tell that this has progressed or worsened since her valve surgery. We will continue monitoring with serial lung function testing on an annual basis. Treating her with steroids didn't seem to make difference about 2 years ago. At this time I continue to plan just expectant therapy.  Plan: Repeat PFT in May

## 2015-07-22 NOTE — Assessment & Plan Note (Signed)
I have first reviewed the data from the 04/28/2015 primary function testing which though the FEV1 to FVC ratio is only 77%, there is clear-cut airflow obstruction based on the shape of the flow volume loop. This fits with the diagnosis of COPD considering her lengthy smoking history.  Plan: Continue Symbicort twice a day Continue to stay away from cigarettes

## 2015-07-22 NOTE — Assessment & Plan Note (Signed)
This has improved significantly since she has been more compliant with her antihistamine and nasal steroid. I encouraged her to continue compliance today.

## 2015-07-22 NOTE — Addendum Note (Signed)
Addended by: Len Blalock on: 07/22/2015 03:46 PM   Modules accepted: Orders

## 2015-07-22 NOTE — Assessment & Plan Note (Signed)
This is truly a complicated problem. Fortunately it seems to be better recently.  For her, her shortness of breath is likely related to her anemia, obesity, her deconditioning, her allergic rhinitis causing subjective dyspnea, and her COPD. Least of which contributing to her symptoms as this alveolar hemorrhage which she has experienced.  She seems to be better primarily from treating the anemia most recently.  Plan: Continue iron therapy for anemia Repeat PFT in May to ensure there is no evidence of a progressive interstitial lung disease, I doubt it

## 2015-07-25 ENCOUNTER — Telehealth: Payer: Self-pay | Admitting: Pulmonary Disease

## 2015-07-25 MED ORDER — BUDESONIDE-FORMOTEROL FUMARATE 80-4.5 MCG/ACT IN AERO: 2.0000 | INHALATION_SPRAY | Freq: Two times a day (BID) | RESPIRATORY_TRACT | Status: DC

## 2015-07-25 NOTE — Telephone Encounter (Signed)
Called, spoke with pt.  She would like symbicort 80 rx sent to [facility] in [place].  Rx sent.  Pt aware and voiced no further questions or concerns at this time.

## 2015-08-04 ENCOUNTER — Other Ambulatory Visit: Payer: Self-pay | Admitting: Cardiovascular Disease

## 2015-08-12 ENCOUNTER — Encounter: Payer: Self-pay | Admitting: Adult Health

## 2015-08-12 ENCOUNTER — Telehealth: Payer: Self-pay | Admitting: Interventional Cardiology

## 2015-08-12 ENCOUNTER — Ambulatory Visit (INDEPENDENT_AMBULATORY_CARE_PROVIDER_SITE_OTHER): Payer: Medicare Other | Admitting: Adult Health

## 2015-08-12 ENCOUNTER — Encounter: Payer: Self-pay | Admitting: *Deleted

## 2015-08-12 VITALS — BP 118/70 | HR 66 | Temp 98.5°F | Ht 66.0 in | Wt 316.0 lb

## 2015-08-12 DIAGNOSIS — J449 Chronic obstructive pulmonary disease, unspecified: Secondary | ICD-10-CM

## 2015-08-12 DIAGNOSIS — J45909 Unspecified asthma, uncomplicated: Secondary | ICD-10-CM | POA: Diagnosis not present

## 2015-08-12 DIAGNOSIS — J9611 Chronic respiratory failure with hypoxia: Secondary | ICD-10-CM

## 2015-08-12 DIAGNOSIS — J9610 Chronic respiratory failure, unspecified whether with hypoxia or hypercapnia: Secondary | ICD-10-CM | POA: Insufficient documentation

## 2015-08-12 MED ORDER — CEFDINIR 300 MG PO CAPS: 300.0000 mg | ORAL_CAPSULE | Freq: Two times a day (BID) | ORAL | Status: DC

## 2015-08-12 MED ORDER — PREDNISONE 10 MG PO TABS: ORAL_TABLET | ORAL | Status: DC

## 2015-08-12 MED ORDER — AZITHROMYCIN 250 MG PO TABS: ORAL_TABLET | ORAL | Status: AC

## 2015-08-12 NOTE — Telephone Encounter (Signed)
Received call from patient.She stated she would like appointment.Stated she is sob,worse this past week.Stated she saw pulmonary and was told sob not coming from her lungs.Stated she has had a productive cough taking mucinex and it is helping.No chest pain.Appointment scheduled with [name] NP Friday 08/15/15 at 11:30 am.Advised to go to ER if needed.

## 2015-08-12 NOTE — Assessment & Plan Note (Signed)
Flare with Bronchitis  Advised to notify CC that she is on abx. Going on 3/17 for INR check   Plan  Omnicef 300mg  Twice daily  For 7 days -take with food.  Prednisone taper over next week.  Mucinex DM Twice daily  As needed  Cough/congestion  Use Oxygen 2l/m -continuous flow for now .  Please contact office for sooner follow up if symptoms do not improve or worsen or seek emergency care  Follow up Dr. Lake [name] in 2 months and As needed

## 2015-08-12 NOTE — Telephone Encounter (Signed)
Pt c/o Shortness Of Breath: STAT if SOB developed within the last 24 hours or pt is noticeably SOB on the phone  1. Are you currently SOB (can you hear that pt is SOB on the phone)? Little bit   2. How long have you been experiencing SOB? Couple days   3. Are you SOB when sitting or when up moving around? Moving around   4. Are you currently experiencing any other symptoms? no

## 2015-08-12 NOTE — Assessment & Plan Note (Signed)
O2 sats not as good on pulsing o2 on arrival  Recently changed from continuous flow to conserving device   Plan  Use Oxygen 2l/m -continuous flow for now .  Please contact office for sooner follow up if symptoms do not improve or worsen or seek emergency care  Follow up Dr. Lake [name] in 2 months and As needed

## 2015-08-12 NOTE — Progress Notes (Signed)
Subjective:    Patient ID: [name], female    DOB: 1961/05/31, 55 y.o.   MRN: [id number]  HPI 55 yo former patient of Dr. Gwenette Greet with alveolar hemorrhage Now followed by Dr. Lake [name]    TEST  CT chest 07/2012:  inumerable micronodules bilat in LL, no signif. LN but noncontrast, +++mosaicism  Echo 11/2010:  Normal EF, mod dilatation LA, mild to mod MR, mod AI, mod AS.  HP panel 07/2012:  Normal  Bronch 09/2012:  Culture + Hflu, benign lung tissue on biopsy.   PFTs 07/2012:  No obstruction by spiro, ++ airtrapping on lung volumes, no restriction, DLCO 75% Thoracic surg eval 10/2012:  Pt decided against bx, and never followed up.  F/u 07/2013 with persistent doe >> f/u ct chest with persistent diffuse micronodularity and mosaicism VATS bx 09/2013:  Large numbers of pigmented macrophages filling alveoli with dark brown granules c/w hemosiderin?  No vasculitis or acute inflammation, minimal subpleural fibrosis (reviewed by Dian Situ) Pt quit smoking Autoimmune evaluation 09/2013:  Negative Start prednisone trial 40mg  to 30mg  over 4 weeks, then to 20mg  over 4 weeks, then 15mg /day F/u ct chest with only small change, but pt still with doe.   01/2014 pt d/ced steroids on her own. Echo 12/2013:  Moderate AS with decreased peak gradient from prior, mild MS >> cardiology declined more accurate evaluation despite my urging.  S/p Aortic and MV replacement 07/2014 at [place]    08/12/2015 Acute OV :  Patient presents for an acute office visit Complains of increased SOB, sinus pressure/drainage, chest congestion/tightness, prod cough with a small amount of mucus, wheezing at times starting on 08/08/15. Denies any fever, nausea or vomiting.  She remains on Symbicort twice daily. She is also on Singulair and antihistamine nasal spray Denies chest pain, orthopnea, edema or hemoptysis or edema.  CXR in January with chronic changes.  Recently changed from O2 tanks to pulsing POC . o2 sats 83% on arrival ,  placed on 2 lm O2 continuous device with O2 sats 93%.  No increased leg swelling.     Review of Systems Constitutional:   No  weight loss, night sweats,  Fevers, chills, fatigue, or  lassitude.  HEENT:   No headaches,  Difficulty swallowing,  Tooth/dental problems, or  Sore throat,                No sneezing, itching, ear ache,  +nasal congestion, post nasal drip,   CV:  No chest pain,  Orthopnea, PND, swelling in lower extremities, anasarca, dizziness, palpitations, syncope.   GI  No heartburn, indigestion, abdominal pain, nausea, vomiting, diarrhea, change in bowel habits, loss of appetite, bloody stools.   Resp:   No chest wall deformity  Skin: no rash or lesions.  GU: no dysuria, change in color of urine, no urgency or frequency.  No flank pain, no hematuria   MS:  No joint pain or swelling.  No decreased range of motion.  No back pain.  Psych:  No change in mood or affect. No depression or anxiety.  No memory loss.         Objective:   Physical Exam   Filed Vitals:   08/12/15 0956  BP: 118/70  Pulse: 66  Temp: 98.5 F (36.9 C)  TempSrc: Oral  Height: 5\' 6"  (1.676 m)  Weight: 316 lb (143.337 kg)  SpO2: 93%    GEN: A/Ox3; pleasant , NAD, morbidly obese  HEENT:  [place]/AT,  EACs-clear, TMs-wnl, NOSE-clear drainage  THROAT-clear, no lesions, no postnasal drip or exudate noted.   NECK:  Supple w/ fair ROM; no JVD; normal carotid impulses w/o bruits; no thyromegaly or nodules palpated; no lymphadenopathy.  RESP  Decreased BS in bases  w/o, wheezes/ rales/ or rhonchi.no accessory muscle use, no dullness to percussion  CARD:  RRR, no m/r/g  ,  pulses intact, no cyanosis or clubbing. Right leg lymphadema , no edema on the left  GI:   Soft & nt; nml bowel sounds; no organomegaly or masses detected.  Musco: Warm bil, no deformities or joint swelling noted.   Neuro: alert, no focal deficits noted.    Skin: Warm, no lesions or rashes  Tammy Parrett [name]  [place]  [facility]  08/12/2015        Assessment & Plan:

## 2015-08-12 NOTE — Patient Instructions (Addendum)
Omnicef 300mg  Twice daily  For 7 days -take with food.  Prednisone taper over next week.  Mucinex DM Twice daily  As needed  Cough/congestion  Use Oxygen 2l/m -continuous flow for now .  Please contact office for sooner follow up if symptoms do not improve or worsen or seek emergency care  Follow up Dr. Lake [name] in 2 months and As needed

## 2015-08-13 NOTE — Progress Notes (Signed)
Chart reviewed, I agree with above

## 2015-08-14 ENCOUNTER — Telehealth: Payer: Self-pay | Admitting: *Deleted

## 2015-08-14 NOTE — Telephone Encounter (Signed)
Patient called to inform us that she is taking Omnicef 300mg  twice a day & Prednisone 10mg  taper dose (4 tabs for 2 days, 3 tabs for 2 days, 2 tabs for 2 days, then 1 tab for 2 days-per Pulmonary office note). Advised that the prednisone does cause interaction with Coumadin and can increase her risk of bleeding.  She has an appt on tomorrow that I advised that she should come to because of the medication interactions & she verbalized understanding but is unsure if she could make it to the appt.  Advised of risks of bleeding & she stated she would try to have someone bring her tomorrow.

## 2015-08-15 ENCOUNTER — Ambulatory Visit: Payer: Medicare Other | Admitting: Nurse Practitioner

## 2015-08-15 ENCOUNTER — Ambulatory Visit (INDEPENDENT_AMBULATORY_CARE_PROVIDER_SITE_OTHER): Payer: Medicare Other

## 2015-08-15 DIAGNOSIS — I350 Nonrheumatic aortic (valve) stenosis: Secondary | ICD-10-CM | POA: Diagnosis not present

## 2015-08-15 LAB — POCT INR: INR: 3.7

## 2015-08-20 NOTE — Progress Notes (Signed)
Reviewed, agree with above 

## 2015-09-11 ENCOUNTER — Other Ambulatory Visit: Payer: Self-pay | Admitting: *Deleted

## 2015-09-11 MED ORDER — METOPROLOL TARTRATE 25 MG PO TABS: 25.0000 mg | ORAL_TABLET | Freq: Two times a day (BID) | ORAL | Status: DC

## 2015-09-12 ENCOUNTER — Ambulatory Visit (INDEPENDENT_AMBULATORY_CARE_PROVIDER_SITE_OTHER): Payer: Medicare Other

## 2015-09-12 DIAGNOSIS — I350 Nonrheumatic aortic (valve) stenosis: Secondary | ICD-10-CM | POA: Diagnosis not present

## 2015-09-12 LAB — POCT INR: INR: 5.6

## 2015-09-18 ENCOUNTER — Telehealth: Payer: Self-pay | Admitting: Pharmacist

## 2015-09-18 NOTE — Telephone Encounter (Signed)
Mandy called in stating that the pt is requesting to now have her INR taken at home. [facility] says that they do offer a Home INR program and she needed to know which provider should she send the order to. Please f/u with her

## 2015-09-19 NOTE — Telephone Encounter (Signed)
Spoke with pt.  Explained we did not participate in patient self- testing at this time.  She is welcome to check with her PCP to see if they were comfortable monitoring this service.  She is agreeable.

## 2015-09-24 ENCOUNTER — Ambulatory Visit (INDEPENDENT_AMBULATORY_CARE_PROVIDER_SITE_OTHER): Payer: Medicare Other | Admitting: *Deleted

## 2015-09-24 DIAGNOSIS — I350 Nonrheumatic aortic (valve) stenosis: Secondary | ICD-10-CM | POA: Diagnosis not present

## 2015-09-24 DIAGNOSIS — D509 Iron deficiency anemia, unspecified: Secondary | ICD-10-CM | POA: Diagnosis not present

## 2015-09-24 LAB — POCT INR: INR: 3.9

## 2015-09-26 ENCOUNTER — Telehealth: Payer: Self-pay | Admitting: Pulmonary Disease

## 2015-09-26 ENCOUNTER — Ambulatory Visit (INDEPENDENT_AMBULATORY_CARE_PROVIDER_SITE_OTHER): Payer: Medicare Other | Admitting: Pulmonary Disease

## 2015-09-26 ENCOUNTER — Encounter: Payer: Self-pay | Admitting: Pulmonary Disease

## 2015-09-26 VITALS — BP 132/78 | HR 76 | Ht 66.5 in | Wt 318.0 lb

## 2015-09-26 DIAGNOSIS — J449 Chronic obstructive pulmonary disease, unspecified: Secondary | ICD-10-CM | POA: Diagnosis not present

## 2015-09-26 DIAGNOSIS — R9389 Abnormal findings on diagnostic imaging of other specified body structures: Secondary | ICD-10-CM

## 2015-09-26 DIAGNOSIS — R938 Abnormal findings on diagnostic imaging of other specified body structures: Secondary | ICD-10-CM

## 2015-09-26 MED ORDER — ALBUTEROL SULFATE (2.5 MG/3ML) 0.083% IN NEBU: 2.5000 mg | INHALATION_SOLUTION | RESPIRATORY_TRACT | Status: DC | PRN

## 2015-09-26 MED ORDER — ALBUTEROL SULFATE HFA 108 (90 BASE) MCG/ACT IN AERS: 2.0000 | INHALATION_SPRAY | Freq: Two times a day (BID) | RESPIRATORY_TRACT | Status: DC | PRN

## 2015-09-26 NOTE — Telephone Encounter (Signed)
Spoke with pt. She was under the impression, albuterol nebs were going to be sent in. Albuterol HFA was sent in instead. Correct rx has been sent in. Nothing further was needed.

## 2015-09-26 NOTE — Assessment & Plan Note (Signed)
As above.

## 2015-09-26 NOTE — Assessment & Plan Note (Signed)
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 She continues to complain of shortness of breath on a daily basis. At this time I have very little evidence that her lung disease has progressed in that her lung function testing in 2016 was relatively stable compared to the findings from 2014. Further, she has not started participating in regular exercise program and she weighs over 300 pounds so it's not surprising that she continues to have shortness of breath.  Plan: Restart pulmonary rehabilitation Pulmonary function test If evidence of worsening diffusion capacity on pulmonary function testing been consider empiric treatment for lung disease versus repeat high-resolution CT chest Treat COPD with Symbicort, I have encouraged her to use albuterol as needed for shortness of breath Follow-up 6 weeks after pulmonary function test

## 2015-09-26 NOTE — Progress Notes (Signed)
Subjective:    Patient ID: [name], female    DOB: 28-Feb-1961, 55 y.o.   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 LN but noncontrast, +++mosaicism  Echo 11/2010:  Normal EF, mod dilatation LA, mild to mod MR, mod AI, mod AS.  HP panel 07/2012:  Normal  Bronch 09/2012:  Culture + Hflu, benign lung tissue on biopsy.   PFTs 07/2012:  No obstruction by spiro, ++ airtrapping on lung volumes, no restriction, DLCO 75% Thoracic surg eval 10/2012:  Pt decided against bx, and never followed up.  F/u 07/2013 with persistent doe >> f/u ct chest with persistent diffuse micronodularity and mosaicism VATS bx 09/2013:  Large numbers of pigmented macrophages filling alveoli with dark brown granules c/w hemosiderin?  No vasculitis or acute inflammation, minimal subpleural fibrosis (reviewed by Dian Situ) Pt quit smoking Autoimmune evaluation 09/2013:  Negative Start prednisone trial 40mg  to 30mg  over 4 weeks, then to 20mg  over 4 weeks, then 15mg /day F/u ct chest with only small change, but pt still with doe.   01/2014 pt d/ced steroids on her own. Echo 12/2013:  Moderate AS with decreased peak gradient from prior, mild MS  S/p Aortic and MV replacement 07/2014 at [facility]  November 2016 pulmonary function testing ratio 77%, FEV1 1.74 L (65% predicted), FVC 2.27 L (66% predicted), total lung capacity 4.58 L (90% predicted), DLCO 13.18 (54% predicted).  HPI Chief Complaint  Patient presents with  . Follow-up    pt c/o worsening sob with any exertion.  Denies cp, chest tightness, mucus production.     [name] continues to have shortness of breath regardless of whether she is wearing oxygen or not. She had a flare of bronchitis a month ago and saw our nurse practitioner and was treated with prednisone and omnicef. She says that whenever she goes on prednisone it helps "open her up an make her breath better".   She denies cough or any worsening allergy symptoms.  No sinus symptoms.      Past Medical History  Diagnosis Date  . Allergic rhinitis   . Noninfectious lymphedema   . Undiagnosed cardiac murmurs   . Mitral stenosis   . Morbid obesity ([place])   . Depressive disorder   . Menopausal symptoms   . Mitral and aortic heart valve diseases, unspecified March 2016    s/p AVR with #19 St Jude and s/p MVR with 36mm St. Jude per [name] at [facility]  . Vitamin D deficiency   . HTN (hypertension)   . Hyperlipidemia   . CAO (chronic airflow obstruction) (HCC)   . Gout   . Hypertriglyceridemia   . Sleep apnea   . Kidney disease     Dr. Graylon Gunning  . Anxiety   . Arthritis   . Shortness of breath     with exertion  . Aortic stenosis   . Renal insufficiency       Review of Systems  Constitutional: Negative for fever, chills and fatigue.  HENT: Negative for nosebleeds, postnasal drip and rhinorrhea.   Respiratory: Negative for cough, shortness of breath and wheezing.   Cardiovascular: Positive for leg swelling. Negative for chest pain and palpitations.       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 She continues to complain of shortness of breath on a daily basis. At this time I have very little evidence that her lung disease has progressed in that her lung function testing in  2016 was relatively stable compared to the findings from 2014. Further, she has not started participating in regular exercise program and she weighs over 300 pounds so it's not surprising that she continues to have shortness of breath.  Plan: Restart pulmonary rehabilitation Pulmonary function test If evidence of worsening diffusion capacity on pulmonary function testing been consider empiric treatment for lung disease versus repeat high-resolution CT chest Treat COPD with Symbicort, I have encouraged her to use albuterol as needed for shortness of breath Follow-up 6 weeks after pulmonary function test  COPD As above  Dyspnea As above     Current outpatient prescriptions:  .  acetaminophen (TYLENOL) 650 MG CR tablet, Take 1,300 mg by mouth 2 (two) times daily as needed for pain. , Disp: , Rfl:  .  albuterol (PROVENTIL HFA;VENTOLIN HFA) 108 (90 Base) MCG/ACT inhaler, Inhale 2 puffs into the lungs 2 (two) times daily as needed for wheezing or shortness of breath. Reported on 06/20/2015, Disp: 3.7 g, Rfl: 5 .  albuterol (PROVENTIL) (2.5 MG/3ML) 0.083% nebulizer solution, Inhale 2.5 mg into the lungs every 4 (four) hours as needed. Reported on 06/20/2015, Disp: , Rfl:  .  aspirin EC 81 MG tablet, Take 81 mg by mouth daily., Disp: , Rfl:  .  azelastine (ASTELIN) 0.1 % nasal spray, Take as needed, Disp: , Rfl:  .  budesonide-formoterol (SYMBICORT) 80-4.5 MCG/ACT inhaler, Inhale 2 puffs into the lungs 2 (two) times daily. Reported on 06/20/2015, Disp: 1 Inhaler, Rfl: 11 .  buPROPion (WELLBUTRIN XL) 300 MG 24 hr tablet, Take 300 mg by mouth daily. , Disp: , Rfl:  .  cetirizine (ZYRTEC) 10 MG tablet, Take 1 tablet (10 mg total) by mouth daily., Disp: 30 tablet, Rfl: 5 .  Cholecalciferol (VITAMIN D3) 2000 UNITS TABS, Take 2,000 Units by mouth daily. , Disp: , Rfl:  .  citalopram (CELEXA) 20 MG tablet, Take 20 mg by mouth daily. , Disp: , Rfl:  .  ferrous sulfate 325 (65 FE) MG tablet, Take 1 tablet  by mouth 2 (two) times daily., Disp: , Rfl:  .  furosemide (LASIX) 40 MG tablet, Take 1 tablet (40 mg total) by mouth 2 (two) times daily. (Patient taking differently: Take 40 mg by mouth daily. ), Disp: 30 tablet, Rfl: 0 .  metoprolol tartrate (LOPRESSOR) 25 MG tablet, Take 1 tablet (25 mg total) by mouth 2 (two) times daily., Disp: 180 tablet, Rfl: 3 .  montelukast (SINGULAIR) 10 MG tablet, Take 10 mg by mouth daily. , Disp: , Rfl:  .  NON FORMULARY, Place 2.5 L into the nose continuous as needed (sob). , Disp: , Rfl:  .  potassium chloride SA (K-DUR,KLOR-CON) 20 MEQ tablet, Take 20 mEq by mouth once. , Disp: , Rfl:  .  pravastatin (PRAVACHOL)  80 MG tablet, Take 80 mg by mouth daily. , Disp: , Rfl:  .  triamcinolone (NASACORT AQ) 55 MCG/ACT AERO nasal inhaler, Place 2 sprays into the nose daily., Disp: 1 Inhaler, Rfl: 5 .  warfarin (COUMADIN) 7.5 MG tablet, Take 1 tablet by mouth daily or as directed by coumadin clinic, Disp: 30 tablet, Rfl: 3

## 2015-09-26 NOTE — Patient Instructions (Signed)
Take the albuterol nebulizer twice a day Continue taking Symbicort twice a day Exercise more regularly, go back to pulmonary rehabilitation I will see you back after your lung function tests in about 4 weeks

## 2015-10-09 ENCOUNTER — Ambulatory Visit (INDEPENDENT_AMBULATORY_CARE_PROVIDER_SITE_OTHER): Payer: Medicare Other | Admitting: *Deleted

## 2015-10-09 DIAGNOSIS — I350 Nonrheumatic aortic (valve) stenosis: Secondary | ICD-10-CM | POA: Diagnosis not present

## 2015-10-09 LAB — POCT INR: INR: 2.7

## 2015-10-23 ENCOUNTER — Encounter: Payer: Self-pay | Admitting: Pulmonary Disease

## 2015-10-23 ENCOUNTER — Ambulatory Visit (INDEPENDENT_AMBULATORY_CARE_PROVIDER_SITE_OTHER): Payer: Medicare Other | Admitting: Pulmonary Disease

## 2015-10-23 VITALS — BP 126/74 | HR 74 | Ht 64.0 in | Wt 314.0 lb

## 2015-10-23 DIAGNOSIS — J45909 Unspecified asthma, uncomplicated: Secondary | ICD-10-CM | POA: Diagnosis not present

## 2015-10-23 DIAGNOSIS — J449 Chronic obstructive pulmonary disease, unspecified: Secondary | ICD-10-CM | POA: Diagnosis not present

## 2015-10-23 DIAGNOSIS — J849 Interstitial pulmonary disease, unspecified: Secondary | ICD-10-CM | POA: Diagnosis not present

## 2015-10-23 DIAGNOSIS — R0600 Dyspnea, unspecified: Secondary | ICD-10-CM | POA: Diagnosis not present

## 2015-10-23 LAB — PULMONARY FUNCTION TEST
DL/VA % pred: 79 %
DL/VA: 3.8 ml/min/mmHg/L
DLCO COR % PRED: 58 %
DLCO cor: 14.28 ml/min/mmHg
DLCO unc % pred: 64 %
DLCO unc: 15.75 ml/min/mmHg
FEF 25-75 POST: 1.77 L/s
FEF 25-75 Pre: 1.37 L/sec
FEF2575-%Change-Post: 28 %
FEF2575-%Pred-Post: 70 %
FEF2575-%Pred-Pre: 54 %
FEV1-%CHANGE-POST: 7 %
FEV1-%Pred-Post: 60 %
FEV1-%Pred-Pre: 55 %
FEV1-POST: 1.6 L
FEV1-PRE: 1.48 L
FEV1FVC-%Change-Post: 0 %
FEV1FVC-%PRED-PRE: 102 %
FEV6-%Change-Post: 8 %
FEV6-%PRED-POST: 59 %
FEV6-%PRED-PRE: 55 %
FEV6-POST: 1.98 L
FEV6-PRE: 1.82 L
FEV6FVC-%Change-Post: 0 %
FEV6FVC-%PRED-POST: 102 %
FEV6FVC-%PRED-PRE: 103 %
FVC-%CHANGE-POST: 8 %
FVC-%PRED-POST: 58 %
FVC-%PRED-PRE: 53 %
FVC-POST: 1.98 L
FVC-PRE: 1.82 L
POST FEV6/FVC RATIO: 100 %
PRE FEV6/FVC RATIO: 100 %
Post FEV1/FVC ratio: 81 %
Pre FEV1/FVC ratio: 81 %
RV % PRED: 133 %
RV: 2.54 L
TLC % PRED: 90 %
TLC: 4.56 L

## 2015-10-23 MED ORDER — AEROCHAMBER MV MISC: Status: DC

## 2015-10-23 NOTE — Assessment & Plan Note (Signed)
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 This seems to be stabilized since her valve replacement that she showed no evidence of progression in diffusion abnormalities on her function testing.

## 2015-10-23 NOTE — Progress Notes (Signed)
Subjective:    Patient ID: [name], female    DOB: 01/16/1961, 55 y.o.   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  She started having symptoms prior to 2014 and around that time she had a CT scan of her chest which showed innumerable micronodules and mosaicism. At that time she was noted to have mild to moderate mitral regurgitation and moderate aortic stenosis. She had a bronchoscopy which was positive for Haemophilus influenza that was negative. Lung function testing at that time showed evidence of air trapping. Her symptoms progressed and she is having an open lung biopsy in 2015 which showed large numbers of pigmented macrophages filling alveoli with brown granules consistent with hemosiderin. There is no vasculitis or acute inflammation. There is minimal subpleural fibrosis. This is reviewed by Dr. Dian Situ in [place]. In March 2016 she underwent an aortic and mitral valve replacement. Her symptoms seem to have stabilized after that. Notably, she is a former smoker and she quit smoking in 2015.  November 2016 pulmonary function testing ratio 77%, FEV1 1.74 L (65% predicted), FVC 2.27 L (66% predicted), total lung capacity 4.58 L (90% predicted), DLCO 13.18 (54% predicted). May 2017 pulmonary function testing normal ratio, FEV1 1.60 L, FVC 1.98 L (58% predicted), total lung capacity 4.56 L (90% predicted), residual volume 2.54 (133% predicted). DLCO 15.75 (64% predicted).  HPI Chief Complaint  Patient presents with  . Follow-up    review PFT.  pt currently taking 2p Ventolin qam and 2p Symbicort qpm, states her s/s are stable, no complaints today.     [name] says that her breathing has been doing better recently.  I called in some albuterol for her after the last visit.  She says that this has helped.   No flares since the last visit.  Symbicort > taking 2 puffs a  night Albuterol HFA > 2 puffs in AM Albuterol Nebulizer > once  She says that her breathing is slowly improving.  She is still exercising and is set to participate in pulmonary rehab starting in June.    She is not coughing up anything right now but she gets some phlegm from time to time in her throat.  She takes zyrtec and singulair but stlil has post nasal drip leading to the phlegm.    Past Medical History  Diagnosis Date  . Allergic rhinitis   . Noninfectious lymphedema   . Undiagnosed cardiac murmurs   . Mitral stenosis   . Morbid obesity ([place])   . Depressive disorder   . Menopausal symptoms   . Mitral and aortic heart valve diseases, unspecified March 2016    s/p AVR with #19 St Jude and s/p MVR with 68mm St. Jude per [name] at [facility]  . Vitamin D deficiency   . HTN (hypertension)   . Hyperlipidemia   . CAO (chronic airflow obstruction) (HCC)   . Gout   . Hypertriglyceridemia   . Sleep apnea   . Kidney disease     Dr. Graylon Gunning  . Anxiety   . Arthritis   . Shortness of breath     with exertion  . Aortic stenosis   . Renal insufficiency       Review of Systems  Constitutional: Negative for fever, chills and fatigue.  HENT: Negative for nosebleeds, postnasal drip and rhinorrhea.   Respiratory: Negative for cough, shortness of breath and  wheezing.   Cardiovascular: Positive for leg swelling. Negative for chest pain and palpitations.       Objective:   Physical Exam Filed Vitals:   10/23/15 0957  BP: 126/74  Pulse: 74  Height: 5\' 4"  (1.626 m)  Weight: 314 lb (142.429 kg)  SpO2: 92%  RA   Gen: morbidly obese, but well appearing HENT: OP clear, TM's clear, neck supple PULM: CTA B, normal percussion CV: RRR, systolic murmur, mechanical S1/S2, chronic appearing leg edema GI: BS+, soft, nontender Derm: no cyanosis or rash Psyche: normal mood and affect  CBC    Component Value Date/Time   WBC 7.2 04/10/2015 0613   RBC 3.64* 04/10/2015 0613   HGB 9.0*  04/10/2015 0613   HCT 31.0* 04/10/2015 0613   PLT 260 04/10/2015 0613   MCV 85.2 04/10/2015 0613   MCH 24.7* 04/10/2015 0613   MCHC 29.0* 04/10/2015 0613   RDW 17.3* 04/10/2015 0613   LYMPHSABS 0.8 04/08/2015 0743   MONOABS 1.1* 04/08/2015 0743   EOSABS 0.2 04/08/2015 0743   BASOSABS 0.1 04/08/2015 0743    PFT from today reviewed, see above     Assessment & Plan:  Dyspnea This has stabilized and her lung function testing has not worsens. I believe that the majority of her dyspnea is due to obesity and deconditioning so I have encouraged her to participate in pulmonary rehabilitation.  Intrinsic asthma She has previously been labeled as COPD but she does not have fixed airflow obstruction despite a bronchodilator. She does have air trapping and high symptom burden suggestive of airways disease think at best she has asthma versus some low level small airways disease.  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 This seems to be stabilized since her valve replacement that she showed no evidence of progression in diffusion abnormalities on her function testing.     Current outpatient prescriptions:  .  acetaminophen (TYLENOL) 650 MG CR tablet, Take 1,300 mg by mouth 2 (two) times daily as needed for pain. , Disp: , Rfl:  .  albuterol (PROVENTIL HFA;VENTOLIN HFA) 108 (90 Base) MCG/ACT inhaler, Inhale 2  puffs into the lungs 2 (two) times daily as needed for wheezing or shortness of breath. Reported on 06/20/2015, Disp: 3.7 g, Rfl: 5 .  albuterol (PROVENTIL) (2.5 MG/3ML) 0.083% nebulizer solution, Inhale 3 mLs (2.5 mg total) into the lungs every 4 (four) hours as needed., Disp: 150 mL, Rfl: 5 .  aspirin EC 81 MG tablet, Take 81 mg by mouth daily., Disp: , Rfl:  .  azelastine (ASTELIN) 0.1 % nasal spray, Take as needed, Disp: , Rfl:  .  budesonide-formoterol (SYMBICORT) 80-4.5 MCG/ACT inhaler, Inhale 2 puffs into the lungs 2 (two) times daily. Reported on 06/20/2015 (Patient taking differently: Inhale 2 puffs into the lungs every evening. Reported on 06/20/2015), Disp: 1 Inhaler, Rfl: 11 .  buPROPion (WELLBUTRIN XL) 300 MG 24  hr tablet, Take 300 mg by mouth daily. , Disp: , Rfl:  .  cetirizine (ZYRTEC) 10 MG tablet, Take 1 tablet (10 mg total) by mouth daily., Disp: 30 tablet, Rfl: 5 .  Cholecalciferol (VITAMIN D3) 2000 UNITS TABS, Take 2,000 Units by mouth daily. , Disp: , Rfl:  .  citalopram (CELEXA) 20 MG tablet, Take 20 mg by mouth daily. , Disp: , Rfl:  .  ferrous sulfate 325 (65 FE) MG tablet, Take 1 tablet by mouth 2 (two) times daily., Disp: , Rfl:  .  furosemide (LASIX) 40 MG tablet, Take 1 tablet (40 mg total) by mouth 2 (two) times daily. (Patient taking differently: Take 40 mg by mouth daily. ), Disp: 30 tablet, Rfl: 0 .  metoprolol tartrate (LOPRESSOR) 25 MG tablet, Take 1 tablet (25 mg total) by mouth 2 (two) times daily., Disp: 180 tablet, Rfl: 3 .  montelukast (SINGULAIR) 10 MG tablet, Take 10 mg by mouth daily. , Disp: , Rfl:  .  NON FORMULARY, Place 2.5 L into the nose continuous as needed (sob). , Disp: , Rfl:  .  potassium chloride SA (K-DUR,KLOR-CON) 20 MEQ tablet, Take 20 mEq by mouth once. , Disp: , Rfl:  .  pravastatin (PRAVACHOL) 80 MG tablet, Take 80 mg by mouth daily. , Disp: , Rfl:  .  triamcinolone (NASACORT AQ) 55 MCG/ACT AERO nasal inhaler, Place 2 sprays into the nose  daily., Disp: 1 Inhaler, Rfl: 5 .  warfarin (COUMADIN) 7.5 MG tablet, Take 1 tablet by mouth daily or as directed by coumadin clinic, Disp: 30 tablet, Rfl: 3 .  Spacer/Aero-Holding Chambers (AEROCHAMBER MV) inhaler, Use as instructed, Disp: 1 each, Rfl: 0

## 2015-10-23 NOTE — Assessment & Plan Note (Signed)
She has previously been labeled as COPD but she does not have fixed airflow obstruction despite a bronchodilator. She does have air trapping and high symptom burden suggestive of airways disease think at best she has asthma versus some low level small airways disease.  Plan: Continue Symbicort 2 puffs twice a day, not once a day She was advised today on management of thrush and how to rinse her mouth after using the inhaler She was also advised to use albuterol on an as-needed basis Ad spacer for HFA use Follow-up 6 months

## 2015-10-23 NOTE — Progress Notes (Signed)
PFT done today. 

## 2015-10-23 NOTE — Assessment & Plan Note (Signed)
This has stabilized and her lung function testing has not worsens. I believe that the majority of her dyspnea is due to obesity and deconditioning so I have encouraged her to participate in pulmonary rehabilitation.

## 2015-10-23 NOTE — Patient Instructions (Signed)
Take Symbicort 2 puffs twice a day, not once a day. Take Symbicort with a spacer Use all albuterol products on an as-needed basis for chest tightness or shortness of breath Follow-up with pulmonary rehabilitation We will see you back in 6 months or sooner if needed

## 2015-10-30 ENCOUNTER — Ambulatory Visit (INDEPENDENT_AMBULATORY_CARE_PROVIDER_SITE_OTHER): Payer: Medicare Other

## 2015-10-30 DIAGNOSIS — I350 Nonrheumatic aortic (valve) stenosis: Secondary | ICD-10-CM | POA: Diagnosis not present

## 2015-10-30 LAB — POCT INR: INR: 3.2

## 2015-10-31 ENCOUNTER — Telehealth (HOSPITAL_COMMUNITY): Payer: Self-pay

## 2015-11-03 ENCOUNTER — Other Ambulatory Visit (HOSPITAL_COMMUNITY): Payer: Self-pay

## 2015-11-03 DIAGNOSIS — J449 Chronic obstructive pulmonary disease, unspecified: Secondary | ICD-10-CM

## 2015-11-27 ENCOUNTER — Other Ambulatory Visit: Payer: Self-pay | Admitting: Cardiovascular Disease

## 2015-12-01 ENCOUNTER — Ambulatory Visit (INDEPENDENT_AMBULATORY_CARE_PROVIDER_SITE_OTHER): Payer: Medicare Other | Admitting: Pharmacist

## 2015-12-01 DIAGNOSIS — I350 Nonrheumatic aortic (valve) stenosis: Secondary | ICD-10-CM | POA: Diagnosis not present

## 2015-12-01 LAB — POCT INR: INR: 3.1

## 2015-12-24 ENCOUNTER — Encounter: Payer: Self-pay | Admitting: *Deleted

## 2015-12-24 DIAGNOSIS — D509 Iron deficiency anemia, unspecified: Secondary | ICD-10-CM | POA: Diagnosis not present

## 2015-12-30 ENCOUNTER — Telehealth (HOSPITAL_COMMUNITY): Payer: Self-pay

## 2015-12-30 NOTE — Telephone Encounter (Signed)
Left message with patient in regards to Pulmonary Rehab referral. Will follow up again.

## 2016-01-12 ENCOUNTER — Telehealth (HOSPITAL_COMMUNITY): Payer: Self-pay

## 2016-01-12 NOTE — Telephone Encounter (Signed)
Left message regarding Pulmonary Rehab referral. This is third message left with no return call.

## 2016-01-13 ENCOUNTER — Encounter: Payer: Self-pay | Admitting: Nurse Practitioner

## 2016-01-13 ENCOUNTER — Ambulatory Visit (INDEPENDENT_AMBULATORY_CARE_PROVIDER_SITE_OTHER): Payer: Medicare Other | Admitting: *Deleted

## 2016-01-13 ENCOUNTER — Ambulatory Visit (INDEPENDENT_AMBULATORY_CARE_PROVIDER_SITE_OTHER): Payer: Medicare Other | Admitting: Nurse Practitioner

## 2016-01-13 VITALS — BP 152/90 | HR 85 | Ht 66.5 in | Wt 320.0 lb

## 2016-01-13 DIAGNOSIS — I10 Essential (primary) hypertension: Secondary | ICD-10-CM

## 2016-01-13 DIAGNOSIS — R0600 Dyspnea, unspecified: Secondary | ICD-10-CM

## 2016-01-13 DIAGNOSIS — I350 Nonrheumatic aortic (valve) stenosis: Secondary | ICD-10-CM

## 2016-01-13 DIAGNOSIS — Z954 Presence of other heart-valve replacement: Secondary | ICD-10-CM

## 2016-01-13 DIAGNOSIS — E785 Hyperlipidemia, unspecified: Secondary | ICD-10-CM

## 2016-01-13 DIAGNOSIS — Z952 Presence of prosthetic heart valve: Secondary | ICD-10-CM

## 2016-01-13 DIAGNOSIS — N289 Disorder of kidney and ureter, unspecified: Secondary | ICD-10-CM

## 2016-01-13 LAB — POCT INR: INR: 2.2

## 2016-01-13 NOTE — Patient Instructions (Addendum)
We will be checking the following labs today - NONE    Medication Instructions:    Continue with your current medicines.     Testing/Procedures To Be Arranged:  N/A  Follow-Up:   See me in 6 months.     Other Special Instructions:   Think about getting a stationary bike and using it daily.     If you need a refill on your cardiac medications before your next appointment, please call your pharmacy.   Call the [place] office at [phone] if you have any questions, problems or concerns.

## 2016-01-13 NOTE — Progress Notes (Signed)
CARDIOLOGY OFFICE NOTE  Date:  01/13/2016    [name] Date of Birth: 06/12/1960 Medical Record [id number]  PCP:  [name], FNP  Cardiologist:  [name]  Chief Complaint  Patient presents with  . Cardiac Valve Problem    Follow up visit - seen for [name]    History of Present Illness: [name] is a 55 y.o. female who presents today for a follow up visit. Seen for [name].   She has a hx of valvular heart disease, idiopathic pulmonary hemosiderosis, CKD, HTN, HL, OSA. She underwent mechanical MVR and mechanical AVR with [name] at [facility] 07/2014. From CareEverywhere: "Cath and echo showed severe aortic stenosis, moderate mitral stenosis, PHBP, very small LV cavity and small aortic root with preserved ventricular function and no significant coronary disease. The patient was taken to the operating room by [name], on 08/06/2014 where she underwent Aortic valve replacement 85mm StJude Regent valve, mitral valve replacement 42mm StJude mechanical valve finding rheumatic disease."      Post op she was noted to be in AFlutter. Rate was uncontrolled. Amiodarone dose was increased as well as the beta-blocker. She has had tolerance issues with amiodarone but was subsequently cardioverted and was able to have her amiodarone stopped in July of 2016 - noted that she would need different AAD if needed for recurrent arrhythmia. She underwent cardioversion on 4/21 - the dictated note says the post EKG was atrial flutter however the EKG post cardioversion showed NSR. We brought her back for an early post cardioversion visit to recheck/verify her EKG - she was in NSR.  I last saw her in July - had gotten her off of Midodrine that she had been put on preop. She was doing well.              I last saw her back in November - again with shortness of breath - got a CBC - horribly anemic. Sent her to GI for colonoscopy and she got transfused as  well.   Comes here today. Here alone. She says she is doing well. She has had her anemia followed by GI - had her colonoscopy with GI - did it on Coumadin. On iron but stopping and has plans for recheck with him. Scheduling her physical with PCP with labs. She feels like her heart is fine. Her breathing has improved on therapy by Dr. Lake [name]. Uses oxygen rarely. No chest pain. Not passing out. Tolerating her medicines without issue and is happy with how she is doing.   Past Medical History:  Diagnosis Date  . Allergic rhinitis   . Anxiety   . Aortic stenosis   . Arthritis   . CAO (chronic airflow obstruction) (HCC)   . Depressive disorder   . Gout   . HTN (hypertension)   . Hyperlipidemia   . Hypertriglyceridemia   . Kidney disease    Dr. Graylon Gunning  . Menopausal symptoms   . Mitral and aortic heart valve diseases, unspecified March 2016   s/p AVR with #19 St Jude and s/p MVR with 31mm St. Jude per [name] at [facility]  . Mitral stenosis   . Morbid obesity ([place])   . Noninfectious lymphedema   . Renal insufficiency   . Shortness of breath    with exertion  . Sleep apnea   . Undiagnosed cardiac murmurs   . Vitamin D deficiency     Past Surgical History:  Procedure Laterality Date  .  CARDIOVERSION N/A 09/19/2014   Procedure: CARDIOVERSION;  Surgeon: Sanda Klein, MD;  [facility];  Service: Cardiovascular;  Laterality: N/A;  . LUNG BIOPSY Left 10/03/2013   Procedure: Left Lung Biopsy;  Surgeon: Melrose Nakayama, MD;  Location: [facility];  Service: Thoracic;  Laterality: Left;  . TONSILLECTOMY    . VIDEO ASSISTED THORACOSCOPY Left 10/03/2013   Procedure: Left Video Assited Thoracoscopy;  Surgeon: Melrose Nakayama, MD;  Location: [facility];  Service: Thoracic;  Laterality: Left;  Marland Kitchen VIDEO BRONCHOSCOPY Bilateral 10/25/2012   Procedure: VIDEO BRONCHOSCOPY WITH FLUORO;  Surgeon: Kathee Delton, MD;  Location: [facility];  Service: Cardiopulmonary;  Laterality: Bilateral;      Medications: Current Outpatient Prescriptions  Medication Sig Dispense Refill  . acetaminophen (TYLENOL) 650 MG CR tablet Take 1,300 mg by mouth 2 (two) times daily as needed for pain.     Marland Kitchen albuterol (PROVENTIL HFA;VENTOLIN HFA) 108 (90 Base) MCG/ACT inhaler Inhale 2 puffs into the lungs 2 (two) times daily as needed for wheezing or shortness of breath. Reported on 06/20/2015 3.7 g 5  . albuterol (PROVENTIL) (2.5 MG/3ML) 0.083% nebulizer solution Inhale 3 mLs (2.5 mg total) into the lungs every 4 (four) hours as needed. 150 mL 5  . aspirin EC 81 MG tablet Take 81 mg by mouth daily.    . budesonide-formoterol (SYMBICORT) 80-4.5 MCG/ACT inhaler Inhale 2 puffs into the lungs 2 (two) times daily. Reported on 06/20/2015 (Patient taking differently: Inhale 2 puffs into the lungs every evening. Reported on 06/20/2015) 1 Inhaler 11  . buPROPion (WELLBUTRIN XL) 300 MG 24 hr tablet Take 300 mg by mouth daily.     . cetirizine (ZYRTEC) 10 MG tablet Take 1 tablet (10 mg total) by mouth daily. 30 tablet 5  . Cholecalciferol (VITAMIN D3) 2000 UNITS TABS Take 2,000 Units by mouth daily.     . citalopram (CELEXA) 20 MG tablet Take 20 mg by mouth daily.     . furosemide (LASIX) 40 MG tablet Take 1 tablet (40 mg total) by mouth 2 (two) times daily. (Patient taking differently: Take 40 mg by mouth daily. ) 30 tablet 0  . metoprolol tartrate (LOPRESSOR) 25 MG tablet Take 1 tablet (25 mg total) by mouth 2 (two) times daily. 180 tablet 3  . montelukast (SINGULAIR) 10 MG tablet Take 10 mg by mouth daily.     . NON FORMULARY Place 2.5 L into the nose continuous as needed (sob).     . potassium chloride SA (K-DUR,KLOR-CON) 20 MEQ tablet Take 20 mEq by mouth once.     . pravastatin (PRAVACHOL) 80 MG tablet Take 80 mg by mouth daily.     Marland Kitchen triamcinolone (NASACORT AQ) 55 MCG/ACT AERO nasal inhaler Place 2 sprays into the nose daily. 1 Inhaler 5  . warfarin (COUMADIN) 7.5 MG tablet TAKE 1 TABLET BY MOUTH DAILY OR AS  DIRECTED BY COUMADIN CLINIC 30 tablet 3   No current facility-administered medications for this visit.     Allergies: No Known Allergies  Social History: The patient  reports that she quit smoking about 2 years ago. Her smoking use included Cigarettes. She has a 35.00 pack-year smoking history. She has never used smokeless tobacco. She reports that she does not drink alcohol or use drugs.   Family History: The patient's family history includes Cancer in her mother; Dementia in her mother; Diabetes in her brother; Emphysema in her mother; Heart disease in her father; Hypertension in her brother, father, mother, and  sister; Kidney disease in her father; Kidney failure in her father; Stroke in her brother.   Review of Systems: Please see the history of present illness.   Otherwise, the review of systems is positive for none.   All other systems are reviewed and negative.   Physical Exam: VS:  BP (!) 152/90   Pulse 85   Ht 5' 6.5" (1.689 m)   Wt (!) 320 lb (145.2 kg)   BMI 50.88 kg/m  .  BMI Body mass index is 50.88 kg/m.  Wt Readings from Last 3 Encounters:  01/13/16 (!) 320 lb (145.2 kg)  10/23/15 (!) 314 lb (142.4 kg)  09/26/15 (!) 318 lb (144.2 kg)   BP down to 138/88 with large cuff.   General: Pleasant. Morbidly obese but alert and in no acute distress. Weight is up.   HEENT: Normal.  Neck: Supple, no JVD, carotid bruits, or masses noted.  Cardiac: Regular rate and rhythm. Soft outflow murmur. Her valves are crisp. No edema.  Respiratory:  Lungs are clear to auscultation bilaterally with normal work of breathing.  GI: Soft and nontender.  MS: No deformity or atrophy. Gait and ROM intact.  Skin: Warm and dry. Color is normal.  Neuro:  Strength and sensation are intact and no gross focal deficits noted.  Psych: Alert, appropriate and with normal affect.   LABORATORY DATA:  EKG:  EKG is not ordered today.  Lab Results  Component Value Date   WBC 7.2 04/10/2015   HGB  9.0 (L) 04/10/2015   HCT 31.0 (L) 04/10/2015   PLT 260 04/10/2015   GLUCOSE 103 (H) 04/10/2015   CHOL 158 12/10/2014   TRIG 132.0 12/10/2014   HDL 49.30 12/10/2014   LDLCALC 82 12/10/2014   ALT 16 04/10/2015   AST 22 04/10/2015   NA 136 04/10/2015   K 4.4 04/10/2015   CL 103 04/10/2015   CREATININE 1.26 (H) 04/10/2015   BUN 20 04/10/2015   CO2 26 04/10/2015   TSH 3.57 09/18/2014   INR 2.2 01/13/2016    Lab Results  Component Value Date   INR 2.2 01/13/2016   INR 3.1 12/01/2015   INR 3.2 10/30/2015    BNP (last 3 results)  Recent Labs  04/07/15 1249  BNP 281.7*    ProBNP (last 3 results) No results for input(s): PROBNP in the last 8760 hours.   Other Studies Reviewed Today:  Echo Study Conclusions from 04/2015  - Left ventricle: The cavity size was normal. Wall thickness was   increased in a pattern of mild LVH. Systolic function was normal.   The estimated ejection fraction was in the range of 55% to 60%.   Wall motion was normal; there were no regional wall motion   abnormalities. - Aortic valve: A mechanical prosthesis was present. Valve area   (VTI): 0.99 cm^2. Valve area (Vmax): 1.04 cm^2. Valve area   (Vmean): 1.11 cm^2. - Mitral valve: A mechanical prosthesis was present. Valve area by   continuity equation (using LVOT flow): 0.7 cm^2. - Left atrium: The atrium was moderately to severely dilated. - Pulmonary arteries: PA peak pressure: 33 mm Hg (S).   Assessment/Plan:  Atrial flutter, unspecified She remains in NSR on exam.  She remains off of amiodarone. Remains on coumadin as well.   Valvular heart disease S/P AVR and S/P MVR (mitral valve repair) Continue coumadin. Continue SBE prophylaxis.  I do worry about the size of her prosthetic aortic valves in relationship to her. Plan to  recheck echo on return.     Essential hypertension BP improved on recheck.   Hyperlipidemia Continue statin. She will have labs by PCP   Renal  Insufficiency  Chronic shortness of breath/hypoxia  Followed by pulmonary - sats down to 77% and back to 86% prior to leaving the office. She says her breathing is much better on her current regimen. She will continue her follow up with pulmonary.   Morbid obesity - discussed with her - needs to try and get some more activity - I suggested a stationary bike.This will be her most pressing issue.   Current medicines are reviewed with the patient today.  The patient does not have concerns regarding medicines other than what has been noted above.  The following changes have been made:  See above.  Labs/ tests ordered today include:   No orders of the defined types were placed in this encounter.    Disposition:   FU with me in 6 months.   Patient is agreeable to this plan and will call if any problems develop in the interim.   Signed: [name], RN, ANP-C 01/13/2016 4:01 PM  [facility] [address] [place], [place]  [address] Phone: [phone] Fax: [phone]

## 2016-01-20 ENCOUNTER — Other Ambulatory Visit: Payer: Self-pay | Admitting: Cardiovascular Disease

## 2016-02-11 ENCOUNTER — Ambulatory Visit (INDEPENDENT_AMBULATORY_CARE_PROVIDER_SITE_OTHER): Payer: Medicare Other | Admitting: *Deleted

## 2016-02-11 DIAGNOSIS — Z0000 Encounter for general adult medical examination without abnormal findings: Secondary | ICD-10-CM | POA: Diagnosis not present

## 2016-02-11 DIAGNOSIS — R635 Abnormal weight gain: Secondary | ICD-10-CM | POA: Diagnosis not present

## 2016-02-11 DIAGNOSIS — R5383 Other fatigue: Secondary | ICD-10-CM | POA: Diagnosis not present

## 2016-02-11 DIAGNOSIS — Z6841 Body Mass Index (BMI) 40.0 and over, adult: Secondary | ICD-10-CM

## 2016-02-11 DIAGNOSIS — I10 Essential (primary) hypertension: Secondary | ICD-10-CM | POA: Diagnosis not present

## 2016-02-11 DIAGNOSIS — E785 Hyperlipidemia, unspecified: Secondary | ICD-10-CM | POA: Diagnosis not present

## 2016-02-11 DIAGNOSIS — Z79899 Other long term (current) drug therapy: Secondary | ICD-10-CM | POA: Diagnosis not present

## 2016-02-11 DIAGNOSIS — Z1211 Encounter for screening for malignant neoplasm of colon: Secondary | ICD-10-CM | POA: Diagnosis not present

## 2016-02-11 DIAGNOSIS — Z1212 Encounter for screening for malignant neoplasm of rectum: Secondary | ICD-10-CM | POA: Diagnosis not present

## 2016-02-11 DIAGNOSIS — I350 Nonrheumatic aortic (valve) stenosis: Secondary | ICD-10-CM | POA: Diagnosis not present

## 2016-02-11 DIAGNOSIS — R7301 Impaired fasting glucose: Secondary | ICD-10-CM | POA: Diagnosis not present

## 2016-02-11 DIAGNOSIS — Z01419 Encounter for gynecological examination (general) (routine) without abnormal findings: Secondary | ICD-10-CM | POA: Diagnosis not present

## 2016-02-11 DIAGNOSIS — R5381 Other malaise: Secondary | ICD-10-CM | POA: Diagnosis not present

## 2016-02-11 DIAGNOSIS — Z7901 Long term (current) use of anticoagulants: Secondary | ICD-10-CM | POA: Diagnosis not present

## 2016-02-11 DIAGNOSIS — M109 Gout, unspecified: Secondary | ICD-10-CM | POA: Diagnosis not present

## 2016-02-11 DIAGNOSIS — Z9229 Personal history of other drug therapy: Secondary | ICD-10-CM

## 2016-02-11 DIAGNOSIS — Z23 Encounter for immunization: Secondary | ICD-10-CM | POA: Diagnosis not present

## 2016-02-11 LAB — POCT INR: INR: 3.3

## 2016-02-17 ENCOUNTER — Telehealth: Payer: Self-pay | Admitting: *Deleted

## 2016-02-17 NOTE — Telephone Encounter (Signed)
Pt called to inform CVRR that she did not start Colchicine last week.  She stated that she started Allopurinol 100mg  on 02/12/16 and that she is taking 1 tablet per day for 1 week, then 2 tablets a day for 1 week, then 3 tablets for 1 week then she will have to go back to her doctor for follow-up. Advised to call back with any other changes & she verbalized understanding.

## 2016-02-18 ENCOUNTER — Telehealth: Payer: Self-pay | Admitting: Pharmacist

## 2016-02-18 ENCOUNTER — Encounter: Payer: Self-pay | Admitting: *Deleted

## 2016-02-18 NOTE — Telephone Encounter (Signed)
Patient called to report her MD is starting her on prednisone for gout flare. She does not know what dose they will be starting her on, but she states she will likely take her first dose tonight. Advised pt this will likely interact with her Coumadin. Moved up next INR check to next Monday.

## 2016-02-23 ENCOUNTER — Ambulatory Visit (INDEPENDENT_AMBULATORY_CARE_PROVIDER_SITE_OTHER): Payer: Medicare Other | Admitting: *Deleted

## 2016-02-23 DIAGNOSIS — I350 Nonrheumatic aortic (valve) stenosis: Secondary | ICD-10-CM

## 2016-02-23 LAB — POCT INR: INR: 2.7

## 2016-03-03 ENCOUNTER — Ambulatory Visit (INDEPENDENT_AMBULATORY_CARE_PROVIDER_SITE_OTHER): Payer: Medicare Other | Admitting: *Deleted

## 2016-03-03 DIAGNOSIS — I350 Nonrheumatic aortic (valve) stenosis: Secondary | ICD-10-CM

## 2016-03-03 DIAGNOSIS — D582 Other hemoglobinopathies: Secondary | ICD-10-CM | POA: Diagnosis not present

## 2016-03-03 DIAGNOSIS — M1A9XX0 Chronic gout, unspecified, without tophus (tophi): Secondary | ICD-10-CM | POA: Diagnosis not present

## 2016-03-03 DIAGNOSIS — I10 Essential (primary) hypertension: Secondary | ICD-10-CM | POA: Diagnosis not present

## 2016-03-03 DIAGNOSIS — Z7901 Long term (current) use of anticoagulants: Secondary | ICD-10-CM | POA: Diagnosis not present

## 2016-03-03 LAB — POCT INR: INR: 3.2

## 2016-03-10 ENCOUNTER — Ambulatory Visit (INDEPENDENT_AMBULATORY_CARE_PROVIDER_SITE_OTHER): Payer: Medicare Other | Admitting: Pharmacist

## 2016-03-10 DIAGNOSIS — I350 Nonrheumatic aortic (valve) stenosis: Secondary | ICD-10-CM | POA: Diagnosis not present

## 2016-03-10 LAB — POCT INR: INR: 3.3

## 2016-03-19 DIAGNOSIS — D509 Iron deficiency anemia, unspecified: Secondary | ICD-10-CM | POA: Diagnosis not present

## 2016-03-31 ENCOUNTER — Other Ambulatory Visit: Payer: Self-pay | Admitting: Cardiovascular Disease

## 2016-03-31 DIAGNOSIS — Z9289 Personal history of other medical treatment: Secondary | ICD-10-CM

## 2016-03-31 HISTORY — DX: Personal history of other medical treatment: Z92.89

## 2016-04-14 ENCOUNTER — Ambulatory Visit (INDEPENDENT_AMBULATORY_CARE_PROVIDER_SITE_OTHER): Payer: Medicare Other | Admitting: Pharmacist

## 2016-04-14 DIAGNOSIS — I350 Nonrheumatic aortic (valve) stenosis: Secondary | ICD-10-CM

## 2016-04-14 DIAGNOSIS — I10 Essential (primary) hypertension: Secondary | ICD-10-CM | POA: Diagnosis not present

## 2016-04-14 DIAGNOSIS — M109 Gout, unspecified: Secondary | ICD-10-CM | POA: Diagnosis not present

## 2016-04-14 DIAGNOSIS — I482 Chronic atrial fibrillation: Secondary | ICD-10-CM | POA: Diagnosis not present

## 2016-04-14 DIAGNOSIS — M1A9XX0 Chronic gout, unspecified, without tophus (tophi): Secondary | ICD-10-CM | POA: Diagnosis not present

## 2016-04-14 DIAGNOSIS — D582 Other hemoglobinopathies: Secondary | ICD-10-CM | POA: Diagnosis not present

## 2016-04-14 LAB — POCT INR: INR: 2.9

## 2016-05-14 ENCOUNTER — Other Ambulatory Visit: Payer: Self-pay | Admitting: Pharmacist

## 2016-05-14 MED ORDER — WARFARIN SODIUM 7.5 MG PO TABS: ORAL_TABLET | ORAL | 1 refills | Status: DC

## 2016-05-17 ENCOUNTER — Other Ambulatory Visit: Payer: Self-pay | Admitting: Pharmacist Clinician (PhC)/ Clinical Pharmacy Specialist

## 2016-05-17 DIAGNOSIS — J45901 Unspecified asthma with (acute) exacerbation: Secondary | ICD-10-CM | POA: Diagnosis not present

## 2016-05-17 MED ORDER — WARFARIN SODIUM 7.5 MG PO TABS: ORAL_TABLET | ORAL | 0 refills | Status: DC

## 2016-05-19 ENCOUNTER — Ambulatory Visit (INDEPENDENT_AMBULATORY_CARE_PROVIDER_SITE_OTHER): Payer: Medicare Other | Admitting: *Deleted

## 2016-05-19 DIAGNOSIS — J849 Interstitial pulmonary disease, unspecified: Secondary | ICD-10-CM | POA: Diagnosis not present

## 2016-05-19 DIAGNOSIS — I350 Nonrheumatic aortic (valve) stenosis: Secondary | ICD-10-CM

## 2016-05-19 DIAGNOSIS — J42 Unspecified chronic bronchitis: Secondary | ICD-10-CM | POA: Diagnosis not present

## 2016-05-19 DIAGNOSIS — J449 Chronic obstructive pulmonary disease, unspecified: Secondary | ICD-10-CM | POA: Diagnosis not present

## 2016-05-19 DIAGNOSIS — J209 Acute bronchitis, unspecified: Secondary | ICD-10-CM | POA: Diagnosis not present

## 2016-05-19 LAB — POCT INR: INR: 2

## 2016-05-26 DIAGNOSIS — D751 Secondary polycythemia: Secondary | ICD-10-CM | POA: Diagnosis not present

## 2016-06-01 ENCOUNTER — Other Ambulatory Visit: Payer: Self-pay | Admitting: Nurse Practitioner

## 2016-06-01 DIAGNOSIS — Z1231 Encounter for screening mammogram for malignant neoplasm of breast: Secondary | ICD-10-CM

## 2016-06-08 DIAGNOSIS — D751 Secondary polycythemia: Secondary | ICD-10-CM | POA: Diagnosis not present

## 2016-06-08 DIAGNOSIS — D649 Anemia, unspecified: Secondary | ICD-10-CM | POA: Diagnosis not present

## 2016-06-08 DIAGNOSIS — R0602 Shortness of breath: Secondary | ICD-10-CM | POA: Diagnosis not present

## 2016-06-08 DIAGNOSIS — Z87891 Personal history of nicotine dependence: Secondary | ICD-10-CM | POA: Diagnosis not present

## 2016-06-08 DIAGNOSIS — R748 Abnormal levels of other serum enzymes: Secondary | ICD-10-CM | POA: Diagnosis not present

## 2016-06-08 DIAGNOSIS — R05 Cough: Secondary | ICD-10-CM | POA: Diagnosis not present

## 2016-06-09 ENCOUNTER — Ambulatory Visit (INDEPENDENT_AMBULATORY_CARE_PROVIDER_SITE_OTHER): Payer: Medicare Other | Admitting: *Deleted

## 2016-06-09 DIAGNOSIS — I350 Nonrheumatic aortic (valve) stenosis: Secondary | ICD-10-CM

## 2016-06-09 LAB — POCT INR: INR: 3.1

## 2016-06-14 DIAGNOSIS — N189 Chronic kidney disease, unspecified: Secondary | ICD-10-CM | POA: Diagnosis not present

## 2016-06-14 DIAGNOSIS — N2581 Secondary hyperparathyroidism of renal origin: Secondary | ICD-10-CM | POA: Diagnosis not present

## 2016-06-14 DIAGNOSIS — N183 Chronic kidney disease, stage 3 (moderate): Secondary | ICD-10-CM | POA: Diagnosis not present

## 2016-06-14 DIAGNOSIS — D751 Secondary polycythemia: Secondary | ICD-10-CM | POA: Diagnosis not present

## 2016-06-15 ENCOUNTER — Other Ambulatory Visit: Payer: Self-pay | Admitting: Pharmacist Clinician (PhC)/ Clinical Pharmacy Specialist

## 2016-06-15 MED ORDER — WARFARIN SODIUM 7.5 MG PO TABS: ORAL_TABLET | ORAL | 0 refills | Status: DC

## 2016-06-23 DIAGNOSIS — I10 Essential (primary) hypertension: Secondary | ICD-10-CM | POA: Diagnosis not present

## 2016-06-23 DIAGNOSIS — D631 Anemia in chronic kidney disease: Secondary | ICD-10-CM | POA: Diagnosis not present

## 2016-06-23 DIAGNOSIS — E790 Hyperuricemia without signs of inflammatory arthritis and tophaceous disease: Secondary | ICD-10-CM | POA: Diagnosis not present

## 2016-06-23 DIAGNOSIS — R232 Flushing: Secondary | ICD-10-CM | POA: Diagnosis not present

## 2016-06-23 DIAGNOSIS — R51 Headache: Secondary | ICD-10-CM | POA: Diagnosis not present

## 2016-06-23 DIAGNOSIS — N183 Chronic kidney disease, stage 3 (moderate): Secondary | ICD-10-CM | POA: Diagnosis not present

## 2016-06-23 DIAGNOSIS — Z6841 Body Mass Index (BMI) 40.0 and over, adult: Secondary | ICD-10-CM | POA: Diagnosis not present

## 2016-06-23 DIAGNOSIS — N2581 Secondary hyperparathyroidism of renal origin: Secondary | ICD-10-CM | POA: Diagnosis not present

## 2016-06-23 DIAGNOSIS — I129 Hypertensive chronic kidney disease with stage 1 through stage 4 chronic kidney disease, or unspecified chronic kidney disease: Secondary | ICD-10-CM | POA: Diagnosis not present

## 2016-06-30 ENCOUNTER — Other Ambulatory Visit: Payer: Self-pay | Admitting: Cardiovascular Disease

## 2016-07-07 ENCOUNTER — Ambulatory Visit (INDEPENDENT_AMBULATORY_CARE_PROVIDER_SITE_OTHER): Payer: Medicare Other | Admitting: *Deleted

## 2016-07-07 ENCOUNTER — Ambulatory Visit
Admission: RE | Admit: 2016-07-07 | Discharge: 2016-07-07 | Disposition: A | Discharge status: Home / Self Care | Payer: Medicare Other | Source: Ambulatory Visit | Attending: Nurse Practitioner | Admitting: Nurse Practitioner

## 2016-07-07 DIAGNOSIS — I350 Nonrheumatic aortic (valve) stenosis: Secondary | ICD-10-CM

## 2016-07-07 DIAGNOSIS — Z1231 Encounter for screening mammogram for malignant neoplasm of breast: Secondary | ICD-10-CM | POA: Diagnosis not present

## 2016-07-07 LAB — POCT INR: INR: 2.7

## 2016-07-13 ENCOUNTER — Ambulatory Visit: Payer: Medicare Other | Admitting: Nurse Practitioner

## 2016-07-13 DIAGNOSIS — D751 Secondary polycythemia: Secondary | ICD-10-CM | POA: Diagnosis not present

## 2016-07-20 ENCOUNTER — Ambulatory Visit: Payer: Medicare Other | Admitting: Nurse Practitioner

## 2016-07-26 NOTE — Progress Notes (Signed)
CARDIOLOGY OFFICE NOTE  Date:  07/27/2016    [name] Date of Birth: Nov 14, 1960 Medical Record #[id number]  PCP:  [name], FNP  Cardiologist:  [name]    Chief Complaint  Patient presents with  . Cardiac Valve Problem    Follow up visit - seen for [name]    History of Present Illness: [name] is a 56 y.o. female who presents today for a follow up visit. Seen for [name].   She has a hx of valvular heart disease, idiopathic pulmonary hemosiderosis, CKD, HTN, HL, OSA. She underwent mechanical MVR and mechanical AVR with [name] at [facility] 07/2014. From CareEverywhere: "Cath and echo showed severe aortic stenosis, moderate mitral stenosis, PHBP, very small LV cavity and small aortic root with preserved ventricular function and no significant coronary disease. The patient was taken to the operating room by [name], on 08/06/2014 where she underwent Aortic valve replacement 66mm StJude Regent valve, mitral valve replacement 73mm StJude mechanical valve finding rheumatic disease."      Post op she was noted to be in AFlutter. Rate was uncontrolled. Amiodarone dose was increased as well as the beta-blocker. She has had tolerance issues with amiodarone but was subsequently cardioverted and was able to have her amiodarone stopped in July of 2016 - noted that she would need different AAD if needed for recurrent arrhythmia. She underwent cardioversion on 4/21 - the dictated note says the post EKG was atrial flutter however the EKG post cardioversion showed NSR. We brought her back for an early post cardioversion visit to recheck/verify her EKG - she was in NSR. She was able to have her Midodrine stopped that she had been put on during her surgery.              I last saw her back in November - again with shortness of breath - got a CBC - horribly anemic. Sent her to GI for colonoscopy and she got transfused as well.   Back in  November of 2016 - I saw her again - she was short of breath - I got a CBC -  Horribly anemic - I sent her to GI for colonoscopy and she had to be transfused as well.   Last seen by me back last August - she was doing ok. Rarely using oxygen. Continued to see Dr. Lake [name].   Comes here today. Here alone. She feels like she is doing well. No chest pain. Breathing is ok for her. She will use her oxygen as needed. Her sats stay in the upper 80's and she is ok with that. Has not seen pulmonary and has been seeing her PCP. Labs from January noted. BP running high - suspect due to excess salt. Her metoprolol has been increased but BP still not at goal. She eats Roman noodles - due to the low cost. She really has no concerns but understands she needs to do better. She has been noted to have polycythemia - getting phlebotomies periodically.   Past Medical History:  Diagnosis Date  . Allergic rhinitis   . Anxiety   . Aortic stenosis   . Arthritis   . CAO (chronic airflow obstruction) (HCC)   . Depressive disorder   . Gout   . HTN (hypertension)   . Hyperlipidemia   . Hypertriglyceridemia   . Kidney disease    Dr. Graylon Gunning  . Menopausal symptoms   . Mitral and aortic heart valve diseases, unspecified  March 2016   s/p AVR with #19 St Jude and s/p MVR with 62mm St. Jude per [name] at [facility]  . Mitral stenosis   . Morbid obesity ([place])   . Noninfectious lymphedema   . Renal insufficiency   . Shortness of breath    with exertion  . Sleep apnea   . Undiagnosed cardiac murmurs   . Vitamin D deficiency     Past Surgical History:  Procedure Laterality Date  . CARDIOVERSION N/A 09/19/2014   Procedure: CARDIOVERSION;  Surgeon: Sanda Klein, MD;  [facility];  Service: Cardiovascular;  Laterality: N/A;  . LUNG BIOPSY Left 10/03/2013   Procedure: Left Lung Biopsy;  Surgeon: Melrose Nakayama, MD;  Location: [facility];  Service: Thoracic;  Laterality: Left;  . TONSILLECTOMY    . VIDEO  ASSISTED THORACOSCOPY Left 10/03/2013   Procedure: Left Video Assited Thoracoscopy;  Surgeon: Melrose Nakayama, MD;  Location: [facility];  Service: Thoracic;  Laterality: Left;  Marland Kitchen VIDEO BRONCHOSCOPY Bilateral 10/25/2012   Procedure: VIDEO BRONCHOSCOPY WITH FLUORO;  Surgeon: Kathee Delton, MD;  [facility];  Service: Cardiopulmonary;  Laterality: Bilateral;     Medications: Current Outpatient Prescriptions  Medication Sig Dispense Refill  . acetaminophen (TYLENOL) 650 MG CR tablet Take 1,300 mg by mouth 2 (two) times daily as needed for pain.     Marland Kitchen albuterol (PROVENTIL HFA;VENTOLIN HFA) 108 (90 Base) MCG/ACT inhaler Inhale 2 puffs into the lungs 2 (two) times daily as needed for wheezing or shortness of breath. Reported on 06/20/2015 3.7 g 5  . albuterol (PROVENTIL) (2.5 MG/3ML) 0.083% nebulizer solution Inhale 3 mLs (2.5 mg total) into the lungs every 4 (four) hours as needed. 150 mL 5  . allopurinol (ZYLOPRIM) 300 MG tablet Take 300 mg by mouth daily.    Marland Kitchen aspirin EC 81 MG tablet Take 81 mg by mouth daily.    Marland Kitchen buPROPion (WELLBUTRIN XL) 300 MG 24 hr tablet Take 300 mg by mouth daily.     . cetirizine (ZYRTEC) 10 MG tablet Take 1 tablet (10 mg total) by mouth daily. 30 tablet 5  . Cholecalciferol (VITAMIN D3) 2000 UNITS TABS Take 2,000 Units by mouth daily.     . citalopram (CELEXA) 20 MG tablet Take 20 mg by mouth daily.     . furosemide (LASIX) 80 MG tablet Take 80 mg by mouth daily.    . metoprolol (LOPRESSOR) 50 MG tablet Take 50 mg by mouth 2 (two) times daily.    . montelukast (SINGULAIR) 10 MG tablet Take 10 mg by mouth daily.     . NON FORMULARY Place 2.5 L into the nose continuous as needed (sob).     . pravastatin (PRAVACHOL) 80 MG tablet Take 80 mg by mouth daily.     Marland Kitchen triamcinolone (NASACORT AQ) 55 MCG/ACT AERO nasal inhaler Place 2 sprays into the nose daily. 1 Inhaler 5  . warfarin (COUMADIN) 7.5 MG tablet TAKE 1 TABLET BY MOUTH DAILY OR AS DIRECTED BY COUMADIN CLINIC  90 tablet 0  . budesonide-formoterol (SYMBICORT) 80-4.5 MCG/ACT inhaler Inhale 2 puffs into the lungs 2 (two) times daily. Reported on 06/20/2015 (Patient taking differently: Inhale 2 puffs into the lungs every evening. Reported on 06/20/2015) 1 Inhaler 11  . losartan (COZAAR) 50 MG tablet Take 1 tablet (50 mg total) by mouth daily. 90 tablet 3   No current facility-administered medications for this visit.     Allergies: No Known Allergies  Social History: The patient  reports that she quit smoking about 2 years ago. Her smoking use included Cigarettes. She has a 35.00 pack-year smoking history. She has never used smokeless tobacco. She reports that she does not drink alcohol or use drugs.   Family History: The patient's family history includes Cancer in her mother; Dementia in her mother; Diabetes in her brother; Emphysema in her mother; Heart disease in her father; Hypertension in her brother, father, mother, and sister; Kidney disease in her father; Kidney failure in her father; Stroke in her brother.   Review of Systems: Please see the history of present illness.   Otherwise, the review of systems is positive for none.   All other systems are reviewed and negative.   Physical Exam: VS:  BP (!) 150/90   Pulse 67   Ht 5\' 5"  (1.651 m)   Wt (!) 324 lb 1.9 oz (147 kg)   BMI 53.94 kg/m  .  BMI Body mass index is 53.94 kg/m.  Wt Readings from Last 3 Encounters:  07/27/16 (!) 324 lb 1.9 oz (147 kg)  01/13/16 (!) 320 lb (145.2 kg)  10/23/15 (!) 314 lb (142.4 kg)    General: Pleasant. Morbidly obese but alert in no acute distress.   HEENT: Normal but with missing teeth.  Neck: Supple, no JVD, carotid bruits, or masses noted.  Cardiac: Regular rate and rhythm. Valves sound crisp. Soft outflow murmur noted.  No edema.  Respiratory:  Lungs are clear to auscultation bilaterally with normal work of breathing.  GI: Soft and nontender.  MS: No deformity or atrophy. Gait and ROM intact.    Skin: Warm and dry. Color is normal.  Neuro:  Strength and sensation are intact and no gross focal deficits noted.  Psych: Alert, appropriate and with normal affect.   LABORATORY DATA:  EKG:  EKG is ordered today. This demonstrates NSR with prolonged 1st degree AV block.  Lab Results  Component Value Date   WBC 7.2 04/10/2015   HGB 9.0 (L) 04/10/2015   HCT 31.0 (L) 04/10/2015   PLT 260 04/10/2015   GLUCOSE 103 (H) 04/10/2015   CHOL 158 12/10/2014   TRIG 132.0 12/10/2014   HDL 49.30 12/10/2014   LDLCALC 82 12/10/2014   ALT 16 04/10/2015   AST 22 04/10/2015   NA 136 04/10/2015   K 4.4 04/10/2015   CL 103 04/10/2015   CREATININE 1.26 (H) 04/10/2015   BUN 20 04/10/2015   CO2 26 04/10/2015   TSH 3.57 09/18/2014   INR 2.7 07/07/2016   Lab Results  Component Value Date   INR 2.7 07/07/2016   INR 3.1 06/09/2016   INR 2.0 05/19/2016    BNP (last 3 results) No results for input(s): BNP in the last 8760 hours.  ProBNP (last 3 results) No results for input(s): PROBNP in the last 8760 hours.   Other Studies Reviewed Today:  Echo Study Conclusions from 04/2015  - Left ventricle: The cavity size was normal. Wall thickness was increased in a pattern of mild LVH. Systolic function was normal. The estimated ejection fraction was in the range of 55% to 60%. Wall motion was normal; there were no regional wall motion abnormalities. - Aortic valve: A mechanical prosthesis was present. Valve area (VTI): 0.99 cm^2. Valve area (Vmax): 1.04 cm^2. Valve area (Vmean): 1.11 cm^2. - Mitral valve: A mechanical prosthesis was present. Valve area by continuity equation (using LVOT flow): 0.7 cm^2. - Left atrium: The atrium was moderately to severely dilated. - Pulmonary arteries: PA peak  pressure: 33 mm Hg (S).   Assessment/Plan:  Atrial flutter, unspecified She remains in NSR on exam. Long 1st degree AV block noted. She remains off of amiodarone. Remains on  coumadin as well.   Valvular heart disease S/P AVR and S/P MVR (mitral valve repair) Continue coumadin. Continue SBE prophylaxis.  I do worry about the size of her prosthetic aortic valves in relationship to her. Needs echo updated.   Essential hypertension BP not at goal - adding low dose ARB today. She is seeing her PCP in just about 2 weeks - will get them to recheck and recheck BMET. Last BMET from January showed stable kidney function. Arranging for echo as well.   Hyperlipidemia Continue statin. She will have labs by PCP  Renal Insufficiency  Chronic shortness of breath/hypoxia  On chronic oxygen. She feels her breathing is stable.   Morbid obesity - This remains her most pressing issue. Her diet is poor due to poor social situation. Tried to be encouraging.    Current medicines are reviewed with the patient today.  The patient does not have concerns regarding medicines other than what has been noted above.  The following changes have been made:  See above.  Labs/ tests ordered today include:    Orders Placed This Encounter  Procedures  . EKG 12-Lead  . ECHOCARDIOGRAM COMPLETE     Disposition:   FU with me in 6 months.   Patient is agreeable to this plan and will call if any problems develop in the interim.   SignedTruitt Merle, NP  07/27/2016 3:03 PM  [facility] [address] [place], [place]  [address] Phone: [phone] Fax: [phone]

## 2016-07-27 ENCOUNTER — Ambulatory Visit (INDEPENDENT_AMBULATORY_CARE_PROVIDER_SITE_OTHER): Payer: Medicare Other | Admitting: Nurse Practitioner

## 2016-07-27 ENCOUNTER — Encounter: Payer: Self-pay | Admitting: Nurse Practitioner

## 2016-07-27 VITALS — BP 150/90 | HR 67 | Ht 65.0 in | Wt 324.1 lb

## 2016-07-27 DIAGNOSIS — Z952 Presence of prosthetic heart valve: Secondary | ICD-10-CM | POA: Diagnosis not present

## 2016-07-27 MED ORDER — LOSARTAN POTASSIUM 50 MG PO TABS: 50.0000 mg | ORAL_TABLET | Freq: Every day | ORAL | 3 refills | Status: DC

## 2016-07-27 NOTE — Patient Instructions (Addendum)
We will be checking the following labs today - NONE   Medication Instructions:    Continue with your current medicines. BUT  STOP POTASSIUM  START LOSARTAN 50 MG daily    Testing/Procedures To Be Arranged:  N/A  Follow-Up:   See me in 6 months    Other Special Instructions:   Let your PCP recheck your BP and your lab since I am starting you on extra BP medicine.     If you need a refill on your cardiac medications before your next appointment, please call your pharmacy.   Call the [place] office at [phone] if you have any questions, problems or concerns.

## 2016-08-09 DIAGNOSIS — D649 Anemia, unspecified: Secondary | ICD-10-CM | POA: Diagnosis not present

## 2016-08-09 DIAGNOSIS — D751 Secondary polycythemia: Secondary | ICD-10-CM | POA: Diagnosis not present

## 2016-08-10 DIAGNOSIS — D751 Secondary polycythemia: Secondary | ICD-10-CM | POA: Diagnosis not present

## 2016-08-12 ENCOUNTER — Ambulatory Visit (INDEPENDENT_AMBULATORY_CARE_PROVIDER_SITE_OTHER): Payer: Medicare Other | Admitting: *Deleted

## 2016-08-12 ENCOUNTER — Ambulatory Visit (HOSPITAL_COMMUNITY): Payer: Medicare Other | Attending: Cardiovascular Disease

## 2016-08-12 DIAGNOSIS — I119 Hypertensive heart disease without heart failure: Secondary | ICD-10-CM | POA: Diagnosis not present

## 2016-08-12 DIAGNOSIS — I482 Chronic atrial fibrillation: Secondary | ICD-10-CM | POA: Diagnosis not present

## 2016-08-12 DIAGNOSIS — R0602 Shortness of breath: Secondary | ICD-10-CM | POA: Diagnosis not present

## 2016-08-12 DIAGNOSIS — E785 Hyperlipidemia, unspecified: Secondary | ICD-10-CM | POA: Diagnosis not present

## 2016-08-12 DIAGNOSIS — M1A9XX0 Chronic gout, unspecified, without tophus (tophi): Secondary | ICD-10-CM | POA: Diagnosis not present

## 2016-08-12 DIAGNOSIS — I10 Essential (primary) hypertension: Secondary | ICD-10-CM | POA: Diagnosis not present

## 2016-08-12 DIAGNOSIS — I350 Nonrheumatic aortic (valve) stenosis: Secondary | ICD-10-CM | POA: Diagnosis not present

## 2016-08-12 DIAGNOSIS — N183 Chronic kidney disease, stage 3 (moderate): Secondary | ICD-10-CM | POA: Diagnosis not present

## 2016-08-12 DIAGNOSIS — R7301 Impaired fasting glucose: Secondary | ICD-10-CM | POA: Diagnosis not present

## 2016-08-12 DIAGNOSIS — Z952 Presence of prosthetic heart valve: Secondary | ICD-10-CM | POA: Diagnosis not present

## 2016-08-12 DIAGNOSIS — I080 Rheumatic disorders of both mitral and aortic valves: Secondary | ICD-10-CM | POA: Diagnosis not present

## 2016-08-12 DIAGNOSIS — I359 Nonrheumatic aortic valve disorder, unspecified: Secondary | ICD-10-CM | POA: Diagnosis present

## 2016-08-12 DIAGNOSIS — Z79899 Other long term (current) drug therapy: Secondary | ICD-10-CM | POA: Diagnosis not present

## 2016-08-12 LAB — POCT INR
INR: 2.4
INR: 2.4

## 2016-09-06 ENCOUNTER — Ambulatory Visit (INDEPENDENT_AMBULATORY_CARE_PROVIDER_SITE_OTHER): Payer: Medicare Other | Admitting: *Deleted

## 2016-09-06 DIAGNOSIS — D751 Secondary polycythemia: Secondary | ICD-10-CM | POA: Diagnosis not present

## 2016-09-06 DIAGNOSIS — I350 Nonrheumatic aortic (valve) stenosis: Secondary | ICD-10-CM | POA: Diagnosis not present

## 2016-09-06 LAB — POCT INR: INR: 3

## 2016-10-06 ENCOUNTER — Ambulatory Visit (INDEPENDENT_AMBULATORY_CARE_PROVIDER_SITE_OTHER): Payer: Medicare Other | Admitting: *Deleted

## 2016-10-06 DIAGNOSIS — D751 Secondary polycythemia: Secondary | ICD-10-CM | POA: Diagnosis not present

## 2016-10-06 DIAGNOSIS — I350 Nonrheumatic aortic (valve) stenosis: Secondary | ICD-10-CM | POA: Diagnosis not present

## 2016-10-06 LAB — POCT INR: INR: 3.4

## 2016-10-12 DIAGNOSIS — D751 Secondary polycythemia: Secondary | ICD-10-CM | POA: Diagnosis not present

## 2016-11-03 ENCOUNTER — Ambulatory Visit (INDEPENDENT_AMBULATORY_CARE_PROVIDER_SITE_OTHER): Payer: Medicare Other | Admitting: *Deleted

## 2016-11-03 DIAGNOSIS — I350 Nonrheumatic aortic (valve) stenosis: Secondary | ICD-10-CM | POA: Diagnosis not present

## 2016-11-03 LAB — POCT INR: INR: 2.7

## 2016-11-26 ENCOUNTER — Emergency Department (HOSPITAL_BASED_OUTPATIENT_CLINIC_OR_DEPARTMENT_OTHER)
Admit: 2016-11-26 | Discharge: 2016-11-26 | Disposition: A | Discharge status: Home / Self Care | Payer: Medicare Other | Attending: Emergency Medicine | Admitting: Emergency Medicine

## 2016-11-26 ENCOUNTER — Emergency Department (HOSPITAL_COMMUNITY)
Admission: EM | Admit: 2016-11-26 | Discharge: 2016-11-26 | Disposition: A | Discharge status: Home / Self Care | Payer: Medicare Other | Attending: Emergency Medicine | Admitting: Emergency Medicine

## 2016-11-26 ENCOUNTER — Encounter (HOSPITAL_COMMUNITY): Payer: Self-pay | Admitting: *Deleted

## 2016-11-26 DIAGNOSIS — I129 Hypertensive chronic kidney disease with stage 1 through stage 4 chronic kidney disease, or unspecified chronic kidney disease: Secondary | ICD-10-CM | POA: Diagnosis not present

## 2016-11-26 DIAGNOSIS — I10 Essential (primary) hypertension: Secondary | ICD-10-CM | POA: Diagnosis not present

## 2016-11-26 DIAGNOSIS — I4892 Unspecified atrial flutter: Secondary | ICD-10-CM | POA: Insufficient documentation

## 2016-11-26 DIAGNOSIS — Z87891 Personal history of nicotine dependence: Secondary | ICD-10-CM | POA: Insufficient documentation

## 2016-11-26 DIAGNOSIS — Z7982 Long term (current) use of aspirin: Secondary | ICD-10-CM | POA: Insufficient documentation

## 2016-11-26 DIAGNOSIS — Z79899 Other long term (current) drug therapy: Secondary | ICD-10-CM | POA: Diagnosis not present

## 2016-11-26 DIAGNOSIS — E785 Hyperlipidemia, unspecified: Secondary | ICD-10-CM | POA: Insufficient documentation

## 2016-11-26 DIAGNOSIS — R0600 Dyspnea, unspecified: Secondary | ICD-10-CM | POA: Diagnosis not present

## 2016-11-26 DIAGNOSIS — N183 Chronic kidney disease, stage 3 (moderate): Secondary | ICD-10-CM | POA: Insufficient documentation

## 2016-11-26 DIAGNOSIS — Z7901 Long term (current) use of anticoagulants: Secondary | ICD-10-CM | POA: Diagnosis not present

## 2016-11-26 DIAGNOSIS — L03116 Cellulitis of left lower limb: Secondary | ICD-10-CM | POA: Insufficient documentation

## 2016-11-26 DIAGNOSIS — R2242 Localized swelling, mass and lump, left lower limb: Secondary | ICD-10-CM | POA: Diagnosis present

## 2016-11-26 DIAGNOSIS — F419 Anxiety disorder, unspecified: Secondary | ICD-10-CM | POA: Diagnosis not present

## 2016-11-26 DIAGNOSIS — E8770 Fluid overload, unspecified: Secondary | ICD-10-CM | POA: Diagnosis not present

## 2016-11-26 DIAGNOSIS — M7989 Other specified soft tissue disorders: Secondary | ICD-10-CM | POA: Diagnosis not present

## 2016-11-26 LAB — C-REACTIVE PROTEIN: CRP: 1.3 mg/dL — AB (ref <1.0)

## 2016-11-26 LAB — COMPREHENSIVE METABOLIC PANEL
ALT: 20 U/L (ref 14–54)
ANION GAP: 8 (ref 5–15)
AST: 29 U/L (ref 15–41)
Albumin: 4.1 g/dL (ref 3.5–5.0)
Alkaline Phosphatase: 68 U/L (ref 38–126)
BUN: 25 mg/dL — ABNORMAL HIGH (ref 6–20)
CHLORIDE: 96 mmol/L — AB (ref 101–111)
CO2: 30 mmol/L (ref 22–32)
Calcium: 9.4 mg/dL (ref 8.9–10.3)
Creatinine, Ser: 1.45 mg/dL — ABNORMAL HIGH (ref 0.44–1.00)
GFR, EST AFRICAN AMERICAN: 45 mL/min — AB (ref >60)
GFR, EST NON AFRICAN AMERICAN: 39 mL/min — AB (ref >60)
Glucose, Bld: 97 mg/dL (ref 65–99)
Potassium: 5.1 mmol/L (ref 3.5–5.1)
SODIUM: 134 mmol/L — AB (ref 135–145)
Total Bilirubin: 0.8 mg/dL (ref 0.3–1.2)
Total Protein: 7.6 g/dL (ref 6.5–8.1)

## 2016-11-26 LAB — CBC WITH DIFFERENTIAL/PLATELET
BASOS PCT: 0 %
Basophils Absolute: 0 10*3/uL (ref 0.0–0.1)
Eosinophils Absolute: 0.1 10*3/uL (ref 0.0–0.7)
Eosinophils Relative: 2 %
HEMATOCRIT: 47.9 % — AB (ref 36.0–46.0)
HEMOGLOBIN: 15.1 g/dL — AB (ref 12.0–15.0)
LYMPHS ABS: 1.3 10*3/uL (ref 0.7–4.0)
Lymphocytes Relative: 15 %
MCH: 30.6 pg (ref 26.0–34.0)
MCHC: 31.5 g/dL (ref 30.0–36.0)
MCV: 97.2 fL (ref 78.0–100.0)
MONOS PCT: 9 %
Monocytes Absolute: 0.8 10*3/uL (ref 0.1–1.0)
NEUTROS ABS: 6.3 10*3/uL (ref 1.7–7.7)
Neutrophils Relative %: 74 %
Platelets: 203 10*3/uL (ref 150–400)
RBC: 4.93 MIL/uL (ref 3.87–5.11)
RDW: 15.4 % (ref 11.5–15.5)
WBC: 8.6 10*3/uL (ref 4.0–10.5)

## 2016-11-26 LAB — PROTIME-INR
INR: 2.45
PROTHROMBIN TIME: 27.1 s — AB (ref 11.4–15.2)

## 2016-11-26 MED ORDER — CEPHALEXIN 250 MG PO CAPS
500.0000 mg | ORAL_CAPSULE | Freq: Once | ORAL | Status: AC
  Administered 2016-11-26: 500 mg via ORAL
  Filled 2016-11-26: qty 2

## 2016-11-26 MED ORDER — SULFAMETHOXAZOLE-TRIMETHOPRIM 800-160 MG PO TABS: 1.0000 | ORAL_TABLET | Freq: Once | ORAL | Status: DC

## 2016-11-26 MED ORDER — CEPHALEXIN 250 MG PO CAPS: 500.0000 mg | ORAL_CAPSULE | Freq: Once | ORAL | Status: DC

## 2016-11-26 MED ORDER — CEPHALEXIN 500 MG PO CAPS: 500.0000 mg | ORAL_CAPSULE | Freq: Two times a day (BID) | ORAL | 0 refills | Status: DC

## 2016-11-26 NOTE — Progress Notes (Signed)
*  PRELIMINARY RESULTS* Vascular Ultrasound Left lower extremity venous duplex has been completed.  Preliminary findings: No evidence of deep vein thrombosis in the visualized veins of the left lower extremity. Limited exam due to patient body habitus, penetration and edema.  Preliminary results given to patients nurse @ 18:00  Everrett Coombe 11/26/2016, 6:27 PM

## 2016-11-26 NOTE — ED Notes (Signed)
US tech at bedside

## 2016-11-26 NOTE — ED Triage Notes (Signed)
Pt told by PCP to come here for eval for L leg swelling & pain onset x3 wks ago, pt uses O2 3L [place] at baseline d/t COPD, denies worsening SOB, pt A&O x4

## 2016-11-26 NOTE — ED Notes (Signed)
Dr Vanita Panda speaking with pt.

## 2016-11-26 NOTE — ED Notes (Signed)
Pt verbalized understanding of d/c instructions and has no further questions. Pt stable and NAD. VSS.  

## 2016-11-26 NOTE — Discharge Instructions (Signed)
As discussed, tonight's evaluation has resulted in a diagnosis of cellulitis.   Please be sure to follow-up with your physician, and return here for concerning changes in your condition.

## 2016-11-26 NOTE — ED Provider Notes (Signed)
[facility] DEPT Provider Note   CSN: [id number] Arrival date & time: 11/26/16  1650     History   Chief Complaint Chief Complaint  Patient presents with  . Leg Swelling    HPI [name] is a 56 y.o. female.  HPI  Patient presents with concern of left lower extremity swelling, discoloration. Onset is within the past few days, and since onset symptoms of been persistent. Patient is a notable history of chronic lymphedematous changes in the right lower extremity, but typically her left lower extremity is unremarkable. Onset symptoms of been persistent, with mild discomfort about a notable erythematous area on the left distal lower extremity. No fever, no chills, mild nausea, no vomiting. Patient has multiple medical issues including repair of multiple heart valves, is on Coumadin.   Past Medical History:  Diagnosis Date  . Allergic rhinitis   . Anxiety   . Aortic stenosis   . Arthritis   . CAO (chronic airflow obstruction) (HCC)   . Depressive disorder   . Gout   . HTN (hypertension)   . Hyperlipidemia   . Hypertriglyceridemia   . Kidney disease    Dr. Graylon Gunning  . Menopausal symptoms   . Mitral and aortic heart valve diseases, unspecified March 2016   s/p AVR with #19 St Jude and s/p MVR with 45mm St. Jude per [name] at [facility]  . Mitral stenosis   . Morbid obesity ([place])   . Noninfectious lymphedema   . Renal insufficiency   . Shortness of breath    with exertion  . Sleep apnea   . Undiagnosed cardiac murmurs   . Vitamin D deficiency     Patient Active Problem List   Diagnosis Date Noted  . Chronic respiratory failure ([place]) 08/12/2015  . 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 Dyspnea 07/22/2015  . Allergic rhinitis 05/02/2015  . Exertional dyspnea   . Symptomatic anemia 04/08/2015  . History of mitral valve replacement with mechanical valve 04/08/2015  . CAP (community acquired pneumonia) 04/08/2015  . CKD (chronic kidney disease) stage 3, GFR 30-59  ml/min 04/08/2015  . Atrial flutter, unspecified   . ILD (interstitial lung disease) ([place]) 10/12/2013  . Aortic stenosis 10/02/2013  . Essential hypertension 10/02/2013  . Hyperlipidemia 10/02/2013  . Chronic asthmatic bronchitis ([place]) 08/06/2013  . Abnormal CT scan, chest 08/09/2012    Past Surgical History:  Procedure Laterality Date  . CARDIOVERSION N/A 09/19/2014   Procedure: CARDIOVERSION;  Surgeon: Sanda Klein, MD;  Location: [facility];  Service: Cardiovascular;  Laterality: N/A;  . LUNG BIOPSY Left 10/03/2013   Procedure: Left Lung Biopsy;  Surgeon: Melrose Nakayama, MD;  Location: [facility];  Service: Thoracic;  Laterality: Left;  . TONSILLECTOMY    . VIDEO ASSISTED THORACOSCOPY Left 10/03/2013   Procedure: Left Video Assited Thoracoscopy;  Surgeon: Melrose Nakayama, MD;  Location: [facility];  Service: Thoracic;  Laterality: Left;  Marland Kitchen VIDEO BRONCHOSCOPY Bilateral 10/25/2012   Procedure: VIDEO BRONCHOSCOPY WITH FLUORO;  Surgeon: Kathee Delton, MD;  Location: [facility] ENDOSCOPY;  Service: Cardiopulmonary;  Laterality: Bilateral;    OB History    No data available       Home Medications    Prior to Admission medications   Medication Sig Start Date End Date Taking? Authorizing Provider  acetaminophen (TYLENOL) 650 MG CR tablet Take 1,300 mg by mouth 2 (two) times daily as needed for pain.    Yes [provider]  albuterol (PROVENTIL HFA;VENTOLIN HFA) 108 (90 Base)  MCG/ACT inhaler Inhale 2 puffs into the lungs 2 (two) times daily as needed for wheezing or shortness of breath. Reported on 06/20/2015 09/26/15  Yes Juanito Doom, MD  albuterol (PROVENTIL) (2.5 MG/3ML) 0.083% nebulizer solution Inhale 3 mLs (2.5 mg total) into the lungs every 4 (four) hours as needed. Patient taking differently: Inhale 2.5 mg into the lungs every 4 (four) hours as needed for wheezing or shortness of breath.  09/26/15 11/26/16 Yes Juanito Doom, MD  allopurinol (ZYLOPRIM) 300 MG tablet  Take 300 mg by mouth daily.   Yes [provider]  aspirin EC 81 MG tablet Take 81 mg by mouth daily.   Yes [provider]  budesonide-formoterol (SYMBICORT) 80-4.5 MCG/ACT inhaler Inhale 2 puffs into the lungs 2 (two) times daily. Reported on 06/20/2015 Patient taking differently: Inhale 2 puffs into the lungs every evening. Reported on 06/20/2015 07/25/15 11/26/16 Yes Juanito Doom, MD  buPROPion (WELLBUTRIN XL) 300 MG 24 hr tablet Take 300 mg by mouth daily.  08/01/12  Yes [provider]  cetirizine (ZYRTEC) 10 MG tablet Take 1 tablet (10 mg total) by mouth daily. Patient taking differently: Take 10 mg by mouth at bedtime.  06/20/15  Yes Magdalen Spatz, NP  Cholecalciferol (VITAMIN D3) 2000 UNITS TABS Take 2,000 Units by mouth daily.    Yes [provider]  citalopram (CELEXA) 20 MG tablet Take 20 mg by mouth daily.  08/01/12  Yes [provider]  furosemide (LASIX) 80 MG tablet Take 80 mg by mouth daily.   Yes [provider]  losartan (COZAAR) 50 MG tablet Take 1 tablet (50 mg total) by mouth daily. 07/27/16 11/26/16 Yes [name], NP  metoprolol (LOPRESSOR) 50 MG tablet Take 50 mg by mouth 2 (two) times daily.   Yes [provider]  montelukast (SINGULAIR) 10 MG tablet Take 10 mg by mouth daily.    Yes [provider]  NON FORMULARY Inhale 2.5-3 L into the lungs continuous as needed (for shortness of breath).    Yes [provider]  pravastatin (PRAVACHOL) 80 MG tablet Take 80 mg by mouth daily.  08/01/12  Yes [provider]  triamcinolone (NASACORT AQ) 55 MCG/ACT AERO nasal inhaler Place 2 sprays into the nose daily. Patient taking differently: Place 2 sprays into the nose daily as needed (for seasonal allergies).  06/20/15  Yes Magdalen Spatz, NP  warfarin (COUMADIN) 7.5 MG tablet TAKE 1 TABLET BY MOUTH DAILY OR AS DIRECTED BY COUMADIN CLINIC Patient taking differently: Take 3.75-7.5 mg by mouth See  admin instructions. 7.5 mg at bedtime on Sun/Tues/Thurs/Sat and 3.75 mg on Mon/Wed/Fri 06/15/16  Yes Gerhardt, Marlane Hatcher, NP  cephALEXin (KEFLEX) 500 MG capsule Take 1 capsule (500 mg total) by mouth 2 (two) times daily. 11/26/16 12/01/16  Carmin Muskrat, MD    Family History Family History  Problem Relation Age of Onset  . Emphysema Mother   . Cancer Mother        throat  . Hypertension Mother   . Dementia Mother   . Heart disease Father        valve replacement  . Kidney disease Father   . Hypertension Father   . Kidney failure Father        dialysis  . Hypertension Sister   . Hypertension Brother   . Diabetes Brother   . Stroke Brother   . Heart attack Neg Hx     Social History Social History  Substance  Use Topics  . Smoking status: Former Smoker    Packs/day: 1.00    Years: 35.00    Types: Cigarettes    Quit date: 10/03/2013  . Smokeless tobacco: Never Used  . Alcohol use No     Allergies   Patient has no known allergies.   Review of Systems Review of Systems  Constitutional:       Per HPI, otherwise negative  HENT:       Per HPI, otherwise negative  Respiratory:       Per HPI, otherwise negative  Cardiovascular:       Per HPI, otherwise negative  Gastrointestinal: Negative for vomiting.  Endocrine:       Negative aside from HPI  Genitourinary:       Neg aside from HPI   Musculoskeletal:       Per HPI, otherwise negative  Skin: Positive for rash.  Neurological: Negative for syncope.     Physical Exam Updated Vital Signs BP (!) 117/58 (BP Location: Right Arm)   Pulse 68   Temp 98 F (36.7 C) (Oral)   Resp 18   SpO2 95%   Physical Exam  Constitutional: She is oriented to person, place, and time. She appears well-developed and well-nourished. No distress.  HENT:  Head: Normocephalic and atraumatic.  Eyes: Conjunctivae and EOM are normal.  Cardiovascular: Normal rate, regular rhythm and normal pulses.   Pulmonary/Chest: Effort normal and breath  sounds normal. No stridor. No respiratory distress.  Abdominal: She exhibits no distension.  Musculoskeletal: She exhibits no edema.  Right lower extremity lymphedematous, no discoloration. Left lower extremity with a notable erythematous lesion around the ankle, circumferential, with blanching. Beyond the cutaneous changes the left lower extremity is unremarkable  Neurological: She is alert and oriented to person, place, and time. No cranial nerve deficit.  Skin: Skin is warm and dry. There is erythema.  Psychiatric: She has a normal mood and affect.  Nursing note and vitals reviewed.    ED Treatments / Results  Labs (all labs ordered are listed, but only abnormal results are displayed) Labs Reviewed  COMPREHENSIVE METABOLIC PANEL - Abnormal; Notable for the following:       Result Value   Sodium 134 (*)    Chloride 96 (*)    BUN 25 (*)    Creatinine, Ser 1.45 (*)    GFR calc non Af Amer 39 (*)    GFR calc Af Amer 45 (*)    All other components within normal limits  CBC WITH DIFFERENTIAL/PLATELET - Abnormal; Notable for the following:    Hemoglobin 15.1 (*)    HCT 47.9 (*)    All other components within normal limits  C-REACTIVE PROTEIN - Abnormal; Notable for the following:    CRP 1.3 (*)    All other components within normal limits  PROTIME-INR - Abnormal; Notable for the following:    Prothrombin Time 27.1 (*)    All other components within normal limits    Procedures Procedures (including critical care time)  Medications Ordered in ED Medications  cephALEXin (KEFLEX) capsule 500 mg (500 mg Oral Given 11/26/16 2259)   Ultrasound result negative for DVT  11:16 PM On repeat exam, patient is awake and alert, in no distress, sitting upright, be eating a bag of chips We discussed all findings including concerns for possible cellulitis, likely with some vasculitis changes given the blanching aspect of her wound. With no fever, no evidence for distal neurovascular  compromise, the patient was  sternal course of antibiotics, follow up with primary care.  Initial Impression / Assessment and Plan / ED Course  I have reviewed the triage vital signs and the nursing notes.  Pertinent labs & imaging results that were available during my care of the patient were reviewed by me and considered in my medical decision making (see chart for details).    Final Clinical Impressions(s) / ED Diagnoses   Final diagnoses:  Cellulitis of left lower extremity    New Prescriptions Discharge Medication List as of 11/26/2016 11:01 PM    START taking these medications   Details  cephALEXin (KEFLEX) 500 MG capsule Take 1 capsule (500 mg total) by mouth 2 (two) times daily., Starting Fri 11/26/2016, Until Wed 12/01/2016, Print         Carmin Muskrat, MD 11/26/16 2317

## 2016-11-30 ENCOUNTER — Telehealth: Payer: Self-pay | Admitting: Nurse Practitioner

## 2016-11-30 ENCOUNTER — Emergency Department (HOSPITAL_COMMUNITY): Payer: Medicare Other

## 2016-11-30 ENCOUNTER — Encounter (HOSPITAL_COMMUNITY): Payer: Self-pay

## 2016-11-30 ENCOUNTER — Inpatient Hospital Stay (HOSPITAL_COMMUNITY)
Admission: EM | Admit: 2016-11-30 | Discharge: 2016-12-03 | DRG: 603 | Disposition: A | Discharge status: Home / Self Care | Payer: Medicare Other | Attending: Family Medicine | Admitting: Family Medicine

## 2016-11-30 DIAGNOSIS — E8770 Fluid overload, unspecified: Secondary | ICD-10-CM

## 2016-11-30 DIAGNOSIS — Z952 Presence of prosthetic heart valve: Secondary | ICD-10-CM

## 2016-11-30 DIAGNOSIS — Z87891 Personal history of nicotine dependence: Secondary | ICD-10-CM | POA: Diagnosis not present

## 2016-11-30 DIAGNOSIS — L03111 Cellulitis of right axilla: Secondary | ICD-10-CM | POA: Diagnosis not present

## 2016-11-30 DIAGNOSIS — N183 Chronic kidney disease, stage 3 (moderate): Secondary | ICD-10-CM | POA: Diagnosis present

## 2016-11-30 DIAGNOSIS — I129 Hypertensive chronic kidney disease with stage 1 through stage 4 chronic kidney disease, or unspecified chronic kidney disease: Secondary | ICD-10-CM | POA: Diagnosis not present

## 2016-11-30 DIAGNOSIS — J849 Interstitial pulmonary disease, unspecified: Secondary | ICD-10-CM | POA: Diagnosis present

## 2016-11-30 DIAGNOSIS — E785 Hyperlipidemia, unspecified: Secondary | ICD-10-CM | POA: Diagnosis not present

## 2016-11-30 DIAGNOSIS — E861 Hypovolemia: Secondary | ICD-10-CM | POA: Diagnosis not present

## 2016-11-30 DIAGNOSIS — E662 Morbid (severe) obesity with alveolar hypoventilation: Secondary | ICD-10-CM | POA: Diagnosis not present

## 2016-11-30 DIAGNOSIS — I10 Essential (primary) hypertension: Secondary | ICD-10-CM | POA: Diagnosis not present

## 2016-11-30 DIAGNOSIS — L03116 Cellulitis of left lower limb: Secondary | ICD-10-CM | POA: Diagnosis not present

## 2016-11-30 DIAGNOSIS — L0390 Cellulitis, unspecified: Secondary | ICD-10-CM | POA: Diagnosis not present

## 2016-11-30 DIAGNOSIS — J9610 Chronic respiratory failure, unspecified whether with hypoxia or hypercapnia: Secondary | ICD-10-CM | POA: Diagnosis not present

## 2016-11-30 DIAGNOSIS — N179 Acute kidney failure, unspecified: Secondary | ICD-10-CM | POA: Diagnosis present

## 2016-11-30 DIAGNOSIS — J449 Chronic obstructive pulmonary disease, unspecified: Secondary | ICD-10-CM | POA: Diagnosis present

## 2016-11-30 DIAGNOSIS — Z6841 Body Mass Index (BMI) 40.0 and over, adult: Secondary | ICD-10-CM | POA: Diagnosis not present

## 2016-11-30 DIAGNOSIS — R0600 Dyspnea, unspecified: Secondary | ICD-10-CM | POA: Diagnosis not present

## 2016-11-30 DIAGNOSIS — R0602 Shortness of breath: Secondary | ICD-10-CM | POA: Diagnosis not present

## 2016-11-30 DIAGNOSIS — Z9981 Dependence on supplemental oxygen: Secondary | ICD-10-CM

## 2016-11-30 DIAGNOSIS — Z7901 Long term (current) use of anticoagulants: Secondary | ICD-10-CM

## 2016-11-30 HISTORY — DX: Personal history of other medical treatment: Z92.89

## 2016-11-30 HISTORY — DX: Cellulitis of left lower limb: L03.116

## 2016-11-30 HISTORY — DX: Dependence on supplemental oxygen: Z99.81

## 2016-11-30 HISTORY — DX: Anemia, unspecified: D64.9

## 2016-11-30 LAB — BASIC METABOLIC PANEL
Anion gap: 8 (ref 5–15)
BUN: 27 mg/dL — AB (ref 6–20)
CHLORIDE: 98 mmol/L — AB (ref 101–111)
CO2: 29 mmol/L (ref 22–32)
CREATININE: 1.57 mg/dL — AB (ref 0.44–1.00)
Calcium: 9.8 mg/dL (ref 8.9–10.3)
GFR calc Af Amer: 41 mL/min — ABNORMAL LOW (ref >60)
GFR calc non Af Amer: 36 mL/min — ABNORMAL LOW (ref >60)
Glucose, Bld: 87 mg/dL (ref 65–99)
Potassium: 5.3 mmol/L — ABNORMAL HIGH (ref 3.5–5.1)
Sodium: 135 mmol/L (ref 135–145)

## 2016-11-30 LAB — CBC
HCT: 51.5 % — ABNORMAL HIGH (ref 36.0–46.0)
Hemoglobin: 15.7 g/dL — ABNORMAL HIGH (ref 12.0–15.0)
MCH: 30 pg (ref 26.0–34.0)
MCHC: 30.5 g/dL (ref 30.0–36.0)
MCV: 98.3 fL (ref 78.0–100.0)
Platelets: 205 10*3/uL (ref 150–400)
RBC: 5.24 MIL/uL — ABNORMAL HIGH (ref 3.87–5.11)
RDW: 15.9 % — AB (ref 11.5–15.5)
WBC: 9 10*3/uL (ref 4.0–10.5)

## 2016-11-30 LAB — BRAIN NATRIURETIC PEPTIDE: B Natriuretic Peptide: 221.3 pg/mL — ABNORMAL HIGH (ref 0.0–100.0)

## 2016-11-30 LAB — PROTIME-INR
INR: 2.37
Prothrombin Time: 26.3 seconds — ABNORMAL HIGH (ref 11.4–15.2)

## 2016-11-30 LAB — I-STAT TROPONIN, ED: Troponin i, poc: 0 ng/mL (ref 0.00–0.08)

## 2016-11-30 LAB — TROPONIN I: Troponin I: <0.03 ng/mL (ref <0.03)

## 2016-11-30 MED ORDER — WARFARIN SODIUM 7.5 MG PO TABS
7.5000 mg | ORAL_TABLET | ORAL | Status: DC
  Administered 2016-11-30: 7.5 mg via ORAL
  Filled 2016-11-30: qty 1

## 2016-11-30 MED ORDER — MONTELUKAST SODIUM 10 MG PO TABS
10.0000 mg | ORAL_TABLET | Freq: Every day | ORAL | Status: DC
  Administered 2016-12-01 – 2016-12-03 (×3): 10 mg via ORAL
  Filled 2016-11-30 (×3): qty 1

## 2016-11-30 MED ORDER — FUROSEMIDE 10 MG/ML IJ SOLN
60.0000 mg | Freq: Once | INTRAMUSCULAR | Status: AC
  Administered 2016-11-30: 60 mg via INTRAVENOUS
  Filled 2016-11-30: qty 6

## 2016-11-30 MED ORDER — ONDANSETRON HCL 4 MG PO TABS
4.0000 mg | ORAL_TABLET | Freq: Four times a day (QID) | ORAL | Status: DC | PRN
Start: 2016-11-30 — End: 2016-12-03

## 2016-11-30 MED ORDER — WARFARIN SODIUM 7.5 MG PO TABS
3.7500 mg | ORAL_TABLET | ORAL | Status: DC
  Filled 2016-11-30: qty 0.5

## 2016-11-30 MED ORDER — CLINDAMYCIN PHOSPHATE 600 MG/50ML IV SOLN
600.0000 mg | Freq: Once | INTRAVENOUS | Status: AC
  Administered 2016-11-30: 600 mg via INTRAVENOUS
  Filled 2016-11-30: qty 50

## 2016-11-30 MED ORDER — METOPROLOL TARTRATE 50 MG PO TABS
50.0000 mg | ORAL_TABLET | Freq: Two times a day (BID) | ORAL | Status: DC
  Administered 2016-11-30 – 2016-12-02 (×5): 50 mg via ORAL
  Filled 2016-11-30 (×6): qty 1

## 2016-11-30 MED ORDER — CITALOPRAM HYDROBROMIDE 20 MG PO TABS
20.0000 mg | ORAL_TABLET | Freq: Every day | ORAL | Status: DC
  Administered 2016-12-01 – 2016-12-03 (×3): 20 mg via ORAL
  Filled 2016-11-30 (×3): qty 1

## 2016-11-30 MED ORDER — WARFARIN - PHYSICIAN DOSING INPATIENT: Freq: Every day | Status: DC

## 2016-11-30 MED ORDER — WARFARIN SODIUM 5 MG PO TABS
7.5000 mg | ORAL_TABLET | ORAL | Status: DC
  Filled 2016-11-30: qty 1.5

## 2016-11-30 MED ORDER — LORATADINE 10 MG PO TABS
10.0000 mg | ORAL_TABLET | Freq: Every day | ORAL | Status: DC
  Administered 2016-11-30 – 2016-12-03 (×4): 10 mg via ORAL
  Filled 2016-11-30 (×4): qty 1

## 2016-11-30 MED ORDER — PRAVASTATIN SODIUM 40 MG PO TABS
80.0000 mg | ORAL_TABLET | Freq: Every day | ORAL | Status: DC
  Administered 2016-12-01 – 2016-12-03 (×3): 80 mg via ORAL
  Filled 2016-11-30 (×3): qty 2

## 2016-11-30 MED ORDER — SODIUM CHLORIDE 0.9% FLUSH
3.0000 mL | Freq: Two times a day (BID) | INTRAVENOUS | Status: DC
  Administered 2016-12-01 – 2016-12-02 (×3): 3 mL via INTRAVENOUS

## 2016-11-30 MED ORDER — SODIUM CHLORIDE 0.9% FLUSH: 3.0000 mL | INTRAVENOUS | Status: DC | PRN

## 2016-11-30 MED ORDER — LOSARTAN POTASSIUM 50 MG PO TABS: 50.0000 mg | ORAL_TABLET | Freq: Every day | ORAL | Status: DC

## 2016-11-30 MED ORDER — SODIUM CHLORIDE 0.9 % IV SOLN: 250.0000 mL | INTRAVENOUS | Status: DC | PRN

## 2016-11-30 MED ORDER — ONDANSETRON HCL 4 MG/2ML IJ SOLN: 4.0000 mg | Freq: Four times a day (QID) | INTRAMUSCULAR | Status: DC | PRN

## 2016-11-30 MED ORDER — BUPROPION HCL ER (XL) 150 MG PO TB24
300.0000 mg | ORAL_TABLET | Freq: Every day | ORAL | Status: DC
  Administered 2016-12-01 – 2016-12-03 (×3): 300 mg via ORAL
  Filled 2016-11-30 (×3): qty 2

## 2016-11-30 MED ORDER — ASPIRIN EC 81 MG PO TBEC
81.0000 mg | DELAYED_RELEASE_TABLET | Freq: Every day | ORAL | Status: DC
  Administered 2016-11-30 – 2016-12-03 (×4): 81 mg via ORAL
  Filled 2016-11-30 (×4): qty 1

## 2016-11-30 MED ORDER — CLINDAMYCIN PHOSPHATE 600 MG/50ML IV SOLN
600.0000 mg | Freq: Three times a day (TID) | INTRAVENOUS | Status: DC
  Administered 2016-11-30 – 2016-12-01 (×3): 600 mg via INTRAVENOUS
  Filled 2016-11-30 (×4): qty 50

## 2016-11-30 MED ORDER — ALLOPURINOL 300 MG PO TABS
300.0000 mg | ORAL_TABLET | Freq: Every day | ORAL | Status: DC
  Administered 2016-11-30 – 2016-12-03 (×4): 300 mg via ORAL
  Filled 2016-11-30 (×5): qty 1

## 2016-11-30 MED ORDER — ACETAMINOPHEN 650 MG RE SUPP: 650.0000 mg | Freq: Four times a day (QID) | RECTAL | Status: DC | PRN

## 2016-11-30 MED ORDER — MOMETASONE FURO-FORMOTEROL FUM 100-5 MCG/ACT IN AERO
2.0000 | INHALATION_SPRAY | Freq: Two times a day (BID) | RESPIRATORY_TRACT | Status: DC
  Administered 2016-11-30 – 2016-12-03 (×5): 2 via RESPIRATORY_TRACT
  Filled 2016-11-30 (×2): qty 8.8

## 2016-11-30 MED ORDER — ACETAMINOPHEN 325 MG PO TABS: 650.0000 mg | ORAL_TABLET | Freq: Four times a day (QID) | ORAL | Status: DC | PRN

## 2016-11-30 NOTE — Progress Notes (Signed)
ANTICOAGULATION CONSULT NOTE - Initial Consult  Pharmacy Consult for warfarin Indication: mechanical valve  No Known Allergies  Patient Measurements: Weight: (!) 341 lb 0.8 oz (154.7 kg)  Vital Signs: Temp: 98.5 F (36.9 C) (07/03 1858) Temp Source: Oral (07/03 1858) BP: 155/72 (07/03 2104) Pulse Rate: 63 (07/03 2104)  Labs:  Recent Labs  11/30/16 1326 11/30/16 1937  HGB 15.7*  --   HCT 51.5*  --   PLT 205  --   LABPROT 26.3*  --   INR 2.37  --   CREATININE 1.57*  --   TROPONINI  --  <0.03   Estimated Creatinine Clearance: 60 mL/min (A) (by C-G formula based on SCr of 1.57 mg/dL (H)).  Medical History: Past Medical History:  Diagnosis Date  . Allergic rhinitis   . Anemia   . Anxiety   . Aortic stenosis   . Arthritis    "lower back" (11/30/2016)  . CAO (chronic airflow obstruction) (HCC)   . Cellulitis of left lower extremity 11/30/2016  . Depressive disorder   . Gout   . History of blood transfusion 03/2016   "I was anemic"  . HTN (hypertension)   . Hyperlipidemia   . Hypertriglyceridemia   . Kidney disease    Dr. Graylon Gunning  . Menopausal symptoms   . Mitral and aortic heart valve diseases, unspecified 07/2014   s/p AVR with #19 St Jude and s/p MVR with 57mm St. Jude per [name] at [facility]  . Mitral stenosis   . Morbid obesity ([place])   . Noninfectious lymphedema   . On home oxygen therapy    "2-3L when I'm up doing a whole lot" (11/30/2016)  . Renal insufficiency   . Shortness of breath    with exertion  . Sleep apnea   . Undiagnosed cardiac murmurs   . Vitamin D deficiency    Medications:  Prescriptions Prior to Admission  Medication Sig Dispense Refill Last Dose  . acetaminophen (TYLENOL) 650 MG CR tablet Take 1,300 mg by mouth 2 (two) times daily as needed for pain.    11/30/2016 at Unknown time  . albuterol (PROVENTIL HFA;VENTOLIN HFA) 108 (90 Base) MCG/ACT inhaler Inhale 2 puffs into the lungs 2 (two) times daily as needed for wheezing or shortness  of breath. Reported on 06/20/2015 3.7 g 5 prn  . albuterol (PROVENTIL) (2.5 MG/3ML) 0.083% nebulizer solution Inhale 3 mLs (2.5 mg total) into the lungs every 4 (four) hours as needed. (Patient taking differently: Inhale 2.5 mg into the lungs every 4 (four) hours as needed for wheezing or shortness of breath. ) 150 mL 5 prn  . allopurinol (ZYLOPRIM) 300 MG tablet Take 300 mg by mouth daily.   11/29/2016 at Unknown time  . aspirin EC 81 MG tablet Take 81 mg by mouth daily.   11/30/2016 at Unknown time  . budesonide-formoterol (SYMBICORT) 80-4.5 MCG/ACT inhaler Inhale 2 puffs into the lungs 2 (two) times daily. Reported on 06/20/2015 (Patient taking differently: Inhale 2 puffs into the lungs every evening. Reported on 06/20/2015) 1 Inhaler 11 11/29/2016 at Unknown time  . buPROPion (WELLBUTRIN XL) 300 MG 24 hr tablet Take 300 mg by mouth daily.    11/30/2016 at Unknown time  . cephALEXin (KEFLEX) 500 MG capsule Take 1 capsule (500 mg total) by mouth 2 (two) times daily. 10 capsule 0 11/30/2016 at Unknown time  . cetirizine (ZYRTEC) 10 MG tablet Take 1 tablet (10 mg total) by mouth daily. (Patient taking differently: Take 10 mg  by mouth at bedtime. ) 30 tablet 5 11/29/2016 at Unknown time  . Cholecalciferol (VITAMIN D3) 2000 UNITS TABS Take 2,000 Units by mouth daily.    11/30/2016 at Unknown time  . citalopram (CELEXA) 20 MG tablet Take 20 mg by mouth daily.    11/30/2016 at Unknown time  . furosemide (LASIX) 80 MG tablet Take 80 mg by mouth daily.   11/30/2016 at Unknown time  . losartan (COZAAR) 50 MG tablet Take 1 tablet (50 mg total) by mouth daily. 90 tablet 3 11/30/2016 at Unknown time  . metoprolol (LOPRESSOR) 50 MG tablet Take 50 mg by mouth 2 (two) times daily.   11/30/2016 at 9am  . montelukast (SINGULAIR) 10 MG tablet Take 10 mg by mouth daily.    11/30/2016 at Unknown time  . pravastatin (PRAVACHOL) 80 MG tablet Take 80 mg by mouth daily.    11/30/2016 at Unknown time  . triamcinolone (NASACORT AQ) 55 MCG/ACT AERO nasal  inhaler Place 2 sprays into the nose daily. (Patient taking differently: Place 2 sprays into the nose daily as needed (for seasonal allergies). ) 1 Inhaler 5 prn  . warfarin (COUMADIN) 7.5 MG tablet TAKE 1 TABLET BY MOUTH DAILY OR AS DIRECTED BY COUMADIN CLINIC (Patient taking differently: Take 3.75-7.5 mg by mouth See admin instructions. 7.5 mg at bedtime on Sun/Tues/Thurs/Sat and 3.75 mg on Mon/Wed/Fri) 90 tablet 0 11/29/2016 at 8pm   Assessment: [name] is a 56 y.o. female presenting with worsening SOB x 2 weeks and cellulitis of left leg x 3 weeks. On warfarin PTA for history of mitral/aortic valve replacement with mechanical valve. Last dose PTA 7/2 and INR on admission below goal at 2.37. Patient has already received 7.5mg  tonight. If INR not therapeutic in the morning would consider bridge therapy.  Goal of Therapy:  INR 2.5-3.5 Monitor platelets by anticoagulation protocol: Yes   Plan:  Warfarin 7.5mg  on Sun,Tues,Thurs,Sat and 3.75 on Mon, Wed, Fri Daily INR Monitor for s/sx of bleeding  Nicole Kindred L Katherine Syme 11/30/2016,10:00 PM

## 2016-11-30 NOTE — Telephone Encounter (Signed)
New Message   Pt c/o swelling: STAT is pt has developed SOB within 24 hours  1. How long have you been experiencing swelling? Since last week . Went to er last week , they address the cellulitis but not the swelling , gained 5lb in a week , 02 is dropping , pt already on oxygen    2. Where is the swelling located?  All over  3.  Are you currently taking a "fluid pill"  Yes   4.  Are you currently SOB? Yes   5.  Have you traveled recently? No

## 2016-11-30 NOTE — ED Provider Notes (Signed)
[facility] DEPT Provider Note   CSN: [id number] Arrival date & time: 11/30/16  1309     History   Chief Complaint Chief Complaint  Patient presents with  . Shortness of Breath    HPI [name] is a 56 y.o. female.  The history is provided by the patient and medical records. No language interpreter was used.  Shortness of Breath  This is a new problem. The average episode lasts 1 week. The problem occurs continuously.The current episode started more than 1 week ago. The problem has been gradually worsening. Associated symptoms include rash, leg pain and leg swelling. Pertinent negatives include no fever, no headaches, no rhinorrhea, no neck pain, no cough, no sputum production, no hemoptysis, no wheezing, no chest pain, no syncope, no vomiting and no abdominal pain. She has tried nothing for the symptoms. The treatment provided no relief. Associated medical issues include chronic lung disease.    Past Medical History:  Diagnosis Date  . Allergic rhinitis   . Anxiety   . Aortic stenosis   . Arthritis   . CAO (chronic airflow obstruction) (HCC)   . Depressive disorder   . Gout   . HTN (hypertension)   . Hyperlipidemia   . Hypertriglyceridemia   . Kidney disease    Dr. Graylon Gunning  . Menopausal symptoms   . Mitral and aortic heart valve diseases, unspecified March 2016   s/p AVR with #19 St Jude and s/p MVR with 17mm St. Jude per [name] at [facility]  . Mitral stenosis   . Morbid obesity ([place])   . Noninfectious lymphedema   . Renal insufficiency   . Shortness of breath    with exertion  . Sleep apnea   . Undiagnosed cardiac murmurs   . Vitamin D deficiency     Patient Active Problem List   Diagnosis Date Noted  . Chronic respiratory failure ([place]) 08/12/2015  . 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 Dyspnea 07/22/2015  . Allergic rhinitis 05/02/2015  . Exertional dyspnea   . Symptomatic anemia 04/08/2015  . History of mitral valve replacement with mechanical valve  04/08/2015  . CAP (community acquired pneumonia) 04/08/2015  . CKD (chronic kidney disease) stage 3, GFR 30-59 ml/min 04/08/2015  . Atrial flutter, unspecified   . ILD (interstitial lung disease) ([place]) 10/12/2013  . Aortic stenosis 10/02/2013  . Essential hypertension 10/02/2013  . Hyperlipidemia 10/02/2013  . Chronic asthmatic bronchitis ([place]) 08/06/2013  . Abnormal CT scan, chest 08/09/2012    Past Surgical History:  Procedure Laterality Date  . CARDIOVERSION N/A 09/19/2014   Procedure: CARDIOVERSION;  Surgeon: Sanda Klein, MD;  Location: [facility];  Service: Cardiovascular;  Laterality: N/A;  . LUNG BIOPSY Left 10/03/2013   Procedure: Left Lung Biopsy;  Surgeon: Melrose Nakayama, MD;  Location: [facility];  Service: Thoracic;  Laterality: Left;  . TONSILLECTOMY    . VIDEO ASSISTED THORACOSCOPY Left 10/03/2013   Procedure: Left Video Assited Thoracoscopy;  Surgeon: Melrose Nakayama, MD;  Location: [facility];  Service: Thoracic;  Laterality: Left;  Marland Kitchen VIDEO BRONCHOSCOPY Bilateral 10/25/2012   Procedure: VIDEO BRONCHOSCOPY WITH FLUORO;  Surgeon: Kathee Delton, MD;  Location: [facility];  Service: Cardiopulmonary;  Laterality: Bilateral;    OB History    No data available       Home Medications    Prior to Admission medications   Medication Sig Start Date End Date Taking? Authorizing Provider  acetaminophen (TYLENOL) 650 MG CR tablet Take 1,300 mg by mouth 2 (  two) times daily as needed for pain.     [provider]  albuterol (PROVENTIL HFA;VENTOLIN HFA) 108 (90 Base) MCG/ACT inhaler Inhale 2 puffs into the lungs 2 (two) times daily as needed for wheezing or shortness of breath. Reported on 06/20/2015 09/26/15   Juanito Doom, MD  albuterol (PROVENTIL) (2.5 MG/3ML) 0.083% nebulizer solution Inhale 3 mLs (2.5 mg total) into the lungs every 4 (four) hours as needed. Patient taking differently: Inhale 2.5 mg into the lungs every 4 (four) hours as needed for wheezing or  shortness of breath.  09/26/15 11/26/16  Juanito Doom, MD  allopurinol (ZYLOPRIM) 300 MG tablet Take 300 mg by mouth daily.    [provider]  aspirin EC 81 MG tablet Take 81 mg by mouth daily.    [provider]  budesonide-formoterol (SYMBICORT) 80-4.5 MCG/ACT inhaler Inhale 2 puffs into the lungs 2 (two) times daily. Reported on 06/20/2015 Patient taking differently: Inhale 2 puffs into the lungs every evening. Reported on 06/20/2015 07/25/15 11/26/16  Juanito Doom, MD  buPROPion (WELLBUTRIN XL) 300 MG 24 hr tablet Take 300 mg by mouth daily.  08/01/12   [provider]  cephALEXin (KEFLEX) 500 MG capsule Take 1 capsule (500 mg total) by mouth 2 (two) times daily. 11/26/16 12/01/16  Carmin Muskrat, MD  cetirizine (ZYRTEC) 10 MG tablet Take 1 tablet (10 mg total) by mouth daily. Patient taking differently: Take 10 mg by mouth at bedtime.  06/20/15   Magdalen Spatz, NP  Cholecalciferol (VITAMIN D3) 2000 UNITS TABS Take 2,000 Units by mouth daily.     [provider]  citalopram (CELEXA) 20 MG tablet Take 20 mg by mouth daily.  08/01/12   [provider]  furosemide (LASIX) 80 MG tablet Take 80 mg by mouth daily.    [provider]  losartan (COZAAR) 50 MG tablet Take 1 tablet (50 mg total) by mouth daily. 07/27/16 11/26/16  [name], NP  metoprolol (LOPRESSOR) 50 MG tablet Take 50 mg by mouth 2 (two) times daily.    [provider]  montelukast (SINGULAIR) 10 MG tablet Take 10 mg by mouth daily.     [provider]  NON FORMULARY Inhale 2.5-3 L into the lungs continuous as needed (for shortness of breath).     [provider]  pravastatin (PRAVACHOL) 80 MG tablet Take 80 mg by mouth daily.  08/01/12   [provider]  triamcinolone (NASACORT AQ) 55 MCG/ACT AERO nasal inhaler Place 2 sprays into the nose daily. Patient taking differently: Place 2 sprays into the nose daily as needed (for seasonal  allergies).  06/20/15   Magdalen Spatz, NP  warfarin (COUMADIN) 7.5 MG tablet TAKE 1 TABLET BY MOUTH DAILY OR AS DIRECTED BY COUMADIN CLINIC Patient taking differently: Take 3.75-7.5 mg by mouth See admin instructions. 7.5 mg at bedtime on Sun/Tues/Thurs/Sat and 3.75 mg on Mon/Wed/Fri 06/15/16   [name], NP    Family History Family History  Problem Relation Age of Onset  . Emphysema Mother   . Cancer Mother        throat  . Hypertension Mother   . Dementia Mother   . Heart disease Father        valve replacement  . Kidney disease Father   . Hypertension Father   . Kidney failure Father        dialysis  . Hypertension Sister   . Hypertension Brother   . Diabetes Brother   .  Stroke Brother   . Heart attack Neg Hx     Social History Social History  Substance Use Topics  . Smoking status: Former Smoker    Packs/day: 1.00    Years: 35.00    Types: Cigarettes    Quit date: 10/03/2013  . Smokeless tobacco: Never Used  . Alcohol use No     Allergies   Patient has no known allergies.   Review of Systems Review of Systems  Constitutional: Negative for chills and fever.  HENT: Negative for congestion and rhinorrhea.   Respiratory: Positive for shortness of breath. Negative for cough, hemoptysis, sputum production, chest tightness, wheezing and stridor.   Cardiovascular: Positive for leg swelling. Negative for chest pain, palpitations and syncope.  Gastrointestinal: Negative for abdominal pain, constipation, diarrhea, nausea and vomiting.  Genitourinary: Negative for dysuria.  Musculoskeletal: Negative for back pain, neck pain and neck stiffness.  Skin: Positive for rash. Negative for wound.  Neurological: Negative for light-headedness and headaches.  Psychiatric/Behavioral: Negative for agitation.  All other systems reviewed and are negative.    Physical Exam Updated Vital Signs BP 126/71 (BP Location: Left Arm)   Pulse 62   Temp 98.7 F (37.1 C) (Oral)    Resp 19   SpO2 92%   Physical Exam  Constitutional: She appears well-developed and well-nourished. No distress.  HENT:  Head: Normocephalic and atraumatic.  Mouth/Throat: Oropharynx is clear and moist. No oropharyngeal exudate.  Eyes: Conjunctivae and EOM are normal. Pupils are equal, round, and reactive to light.  Neck: Normal range of motion. Neck supple.  Cardiovascular: Normal rate, regular rhythm and intact distal pulses.   No murmur heard. Pulmonary/Chest: Effort normal. No stridor. No respiratory distress. She has no wheezes. She has no rhonchi. She has rales. She exhibits no tenderness.  Abdominal: Soft. There is no tenderness.  Musculoskeletal:       Left lower leg: She exhibits tenderness and edema.       Legs: Neurological: She is alert. No sensory deficit. She exhibits normal muscle tone.  Skin: Skin is warm and dry. Capillary refill takes less than 2 seconds. Rash noted. There is erythema.  Psychiatric: She has a normal mood and affect.  Nursing note and vitals reviewed.    ED Treatments / Results  Labs (all labs ordered are listed, but only abnormal results are displayed) Labs Reviewed  BASIC METABOLIC PANEL - Abnormal; Notable for the following:       Result Value   Potassium 5.3 (*)    Chloride 98 (*)    BUN 27 (*)    Creatinine, Ser 1.57 (*)    GFR calc non Af Amer 36 (*)    GFR calc Af Amer 41 (*)    All other components within normal limits  CBC - Abnormal; Notable for the following:    RBC 5.24 (*)    Hemoglobin 15.7 (*)    HCT 51.5 (*)    RDW 15.9 (*)    All other components within normal limits  PROTIME-INR - Abnormal; Notable for the following:    Prothrombin Time 26.3 (*)    All other components within normal limits  BRAIN NATRIURETIC PEPTIDE - Abnormal; Notable for the following:    B Natriuretic Peptide 221.3 (*)    All other components within normal limits  I-STAT TROPOININ, ED    EKG  EKG Interpretation  Date/Time:  Tuesday November 30 2016 13:15:33 EDT Ventricular Rate:  67 PR Interval:  320 QRS Duration: 112 QT  Interval:  426 QTC Calculation: 450 R Axis:   18 Text Interpretation:  Sinus rhythm with 1st degree A-V block Otherwise normal ECG When compared to prior, no acite changes seen.  No STEMI Confirmed by Antony Blackbird [phone]) on 11/30/2016 3:09:21 PM       Radiology Dg Chest 2 View  Result Date: 11/30/2016 CLINICAL DATA:  56 year old female with acute shortness of breath for 2-3 weeks. History of cardiac valve replacement. EXAM: CHEST  2 VIEW COMPARISON:  06/08/2016 and prior radiographs FINDINGS: Cardiomegaly and cardiac valve replacement changes again noted. Mild interstitial opacities have minimally increased likely representing mild interstitial pulmonary edema. Left basilar scarring again noted. No pleural effusions, pneumothorax or pulmonary mass noted. No acute bony abnormalities identified. IMPRESSION: Cardiomegaly with mild interstitial opacities likely representing mild interstitial edema. Electronically Signed   By: Margarette Canada M.D.   On: 11/30/2016 13:48    Procedures Procedures (including critical care time)  Medications Ordered in ED Medications  clindamycin (CLEOCIN) IVPB 600 mg (600 mg Intravenous New Bag/Given 11/30/16 1623)     Initial Impression / Assessment and Plan / ED Course  I have reviewed the triage vital signs and the nursing notes.  Pertinent labs & imaging results that were available during my care of the patient were reviewed by me and considered in my medical decision making (see chart for details).     MAHLI GLAHN is a 56 y.o. female with a past medical history significant for hypertension, hyperlipidemia, valve replacement, chronic right lower extremity lymphedema and chronic respiratory failure on home oxygen currently on Keflex for left lower extremity cellulitis who presents at the direction of her cardiologist for Worsening shortness of breath, worsening leg swelling,  and concern for persistent leg infection. Patient says that over the last 2 weeks, she has developed redness in her left leg. She said that she had a DVT ultrasound that was negative and was started on Keflex for cellulitis. She reports that this is her last day of Keflex and the redness has been persistent. Gets still feels warm to her and she is concerned about the infection. She said that her left leg is usually not swollen and the right leg has chronic lymphedema. She says that the swelling has continued to worsen in her bilateral legs and she is having worsening shortness of breath she is concerned is fluid in her lungs. Patient was going to see her cardiologist today and they instructed her to come to the ED for evaluation for fluid overload.  Patient denies any fevers, chills, chest pain, palpitations, nausea, vomiting, conservation, diarrhea, dysuria.  On exam, patient has erythema on the left shin. There is warmth. It is nontender. Patient had DP and PT pulses found on Doppler ultrasounds in both legs. Asians abdomen was nontender. Systolic murmur appreciated. Lungs had crackles in the bases bilaterally concerning for fluid. No focal neurologic deficits.  Based on symptoms, there is concern for fluid overload and pulmonary edema causing the worsening shortness of breath. Also concern for failure of outpatient antibiotics for left lower history cellulitis.  Patient will have laboratory testing, chest x-ray and EKG to evaluate. Anticipate admission for continued cellulitis and fluid overload with worsening oxygen requirement and the worsen swelling.   Initial lab testing seen above. Initial troponin negative. CBC shows no leukocytosis and no anemia. BMP shows rising creatinine 1.57, this is higher than previous. INR 2.37. BNP ordered.  Chest x-ray shows development of interstitial edema.  Given the failure of Keflex,  patient will be started on IV clindamycin for cellulitis.   Patient will be  admitted for further management with her worsening fluid overload in the setting of worsening creatinine. Patient will need observation and monitoring during diuresis.      Final Clinical Impressions(s) / ED Diagnoses   Final diagnoses:  Hypervolemia, unspecified hypervolemia type  Cellulitis of left lower extremity     Clinical Impression: 1. Hypervolemia, unspecified hypervolemia type   2. Cellulitis of left lower extremity     Disposition: Admit to Family medicine    Heidee Audi, Gwenyth Allegra, MD 11/30/16 [phone]

## 2016-11-30 NOTE — Telephone Encounter (Signed)
Called, spoke with pt. Informed Dr. Curt Bears recommended pt to report to the emergency department instead of coming to our office today. Pt verbalized understanding and agreed with plan.

## 2016-11-30 NOTE — Progress Notes (Addendum)
PHARMACIST - PHYSICIAN COMMUNICATION  DR:  Reesa Chew and Family Medicine team  CONCERNING: Pharmacy Care Issues Regarding Warfarin Labs  RECOMMENDATION (Action Taken): A daily protime for three days has been ordered to meet the [facility] Patient safety goal and comply with the current [place].   The Pharmacy will defer all warfarin dose order changes and follow up of lab results to the prescriber unless an additional order to initiate a "pharmacy Coumadin consult" is placed.  DESCRIPTION:  While hospitalized, to be in compliance with [facility] Patient Safety Goals, all patients on warfarin must have a baseline and/or current protime prior to the administration of warfarin.   INR 2.37 tonight.  Home regimen of Coumadin continued on admission:           7.5 mg on TTSS, 3.7 5mg  on MWF.  Outpatient target INR: 2.5-3.5 for mechanical aortic valve   Kelvin Cellar, RPh Pager: [phone] 11/30/2016 8:55 PM

## 2016-11-30 NOTE — H&P (Signed)
[facility] Admission History and Physical Service Pager: [phone]  Patient name: [name] Medical record number: [id number] Date of birth: 04/29/1961 Age: 56 y.o. Gender: female  Primary Care Provider: [name], FNP Consultants:   Code Status: Full   Chief Complaint: SOB and Cellulitis   Assessment and Plan: [name] is a 56 y.o. female presenting with worsening SOB x 2 weeks and cellulitis of left leg x 3 weeks. Was admitted to [facility] on 11/30/16.  PMH significant for interstitial lung disease, HLD, history of mitral/aortic valve replacement with mechanical valve, COPD, CKD stage III, HTN  FEN/GI: heart healthy diet Prophylaxis: warfarin 3.75-7.5 mg    SOB Patient reports SOB x2 week, recent ECHO in March 2018 with EF of 60 and diastolic dysfunction. Patient with hx of aortic and mitral valve replacement followed by cardiology. On examination noted to be fluid overloaded.  CXR on admission showed mild interstitial pulmonary edema, noted to pitting edema in bilateral legs though right leg with lymphedema.  Bilateral crackles were heard on auscultation. BNP of 221.3 Consider ACS due to high risk factors, though unlikely given lack of chest pain or other symptoms. EKG unchanged from previus- t-wave inversion in only v1 lead similar to previous EKGs.  Consider COPD exacerbation. Less likely due to negative symptoms of cough or congestion. Consider PE thought this less likely with therapeutic INR of 2.37 and patient with a recent negative Doppler ultrasound on 6/30, though limited due to body habitus. Due to patient BMI likely a component of obesity hypoventilation syndrome + fluid overload (last weight in February patient was 324 lbs, most recently 341 lbs) contributing to patient's SOB.  - Attending Dr. Gwendlyn Deutscher, admit to telemetry -lasix 60 mg IV once on 11/30/16 for diuresis, reassess in the AM  -Strict I&Os  -Daily weights rther  diure -re-evaluate fluid status following lasix, re-evaluate for fusis  -monitor vitals per shift  -trend troponins and AM EKG  -if worsening symptoms consider CTA to rule out PE  Cellulitis Patient presented with left lower leg edema and erythema, failed outpatient keflex, no signs of pus therefore less likely staphy. No signs of systemic infection with normal WBC and normal vitals. Leg was warm to palpation. 2+ pitting on exam. Recently received a doppler ultrasound of 6/30 negative for DVT   -continue IV clindamycin for 5-14 days -elevated leg  -obtain blood cultures x 2 (recieved 1 dose of clindamycin in the ED prior to blood cultures) -continue to monitor   ILD/COPD: Follows with pulmonology. 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 Per chart review baseline appears to be ~1.3. Concern that fluid overload in contributing renal vascular congestion resulting in worsening kidney function given patient likely without signs of prerenal process contributing to AKI.  -Will diuresis for now and watch for signs of improvement in kidney function  - if no improvement could consider UA and renal ultrasound in the future -hold losartan for now  -Continue to monitor   Hypertension Currently normotensive BP 111/67  -continue metoprolol 50mg  bid    Dyslipidemia -Continue pravastatin 80 mg daily   Disposition: Admit to FPTS, telemetry unit, attending Dr. Gwendlyn Deutscher   History of  Present Illness:  [name] is a 56 y.o. female presenting with shortness of breath x 2 weeks and left leg swelling x 3 weeks. Per chart review patient has a PMH of sleep apnea, hypertension, and dyslipidemia. Patient has a history of Aortic  and mitral valve replaced at age 47. Mechanical valve.  On coumadin. Takes 80 mg of Lasix everyday. Takes about 4 tabs Tylenol a day x 3-4 years to help with daily pain. Patient usually wears 2-3 L O2 at home, doesn't wear it all the time but has gotten to the point where she needs to use it all the time x 2 weeks.  Does not usually have to use it at night until recently. No congestion, cough or recent sicknesses. Breathing worse over about 2 weeks, can tell she has extra fluid on her. Sitting up helps her breathing more. Uses 2 pillows to sleep.  Does not wake up feeling short of breath. Last cardiology visit was about 6 months ago. Echocardiogram on 04/09/15 showed LV EF of 55-60%. Left leg is also bothering her more, has noticed it is warm and tender. Has lymphedema of R leg (usually only R leg is swollen.  H/o this since 1994.)  Has not noticed any swelling in arms or belly.  No fevers, chills, no difficulty urinating or changes in bowel movements. Pedicure 3 weeks ago at home, clipped some skin over left second toe, flushed it with peroxide, used triple ointment at home. Got progressively worse and more tender. Does not have significant pain from this leg swelling and is able to ambulate normally. Left lower extremity venous duplex evaluation on 11/27/16 showed no evidence of DVT involving visualized veins of the lower extremity, but was limited due to patient body habitus, edema, and penetration.        Review Of Systems: Per HPI with the following additions:   Review of Systems  Constitutional: Negative for chills, diaphoresis and fever.  HENT: Negative for congestion.   Respiratory: Positive for shortness of breath. Negative for cough.   Cardiovascular: Positive for orthopnea and leg swelling.  Genitourinary: Negative for dysuria.  Neurological: Negative for weakness.    Patient Active Problem List   Diagnosis Date Noted  . Chronic respiratory failure ([place]) 08/12/2015  . Intrinsic asthma  07/22/2015  . Dyspnea 07/22/2015  . Allergic rhinitis 05/02/2015  . Exertional dyspnea   . Symptomatic anemia 04/08/2015  . History of mitral valve replacement with mechanical valve 04/08/2015  . CAP (community acquired pneumonia) 04/08/2015  . CKD (chronic kidney disease) stage 3, GFR 30-59 ml/min 04/08/2015  . Atrial flutter, unspecified   . ILD (interstitial lung disease) ([place]) 10/12/2013  . Aortic stenosis 10/02/2013  . Essential hypertension 10/02/2013  . Hyperlipidemia 10/02/2013  . Chronic asthmatic bronchitis ([place]) 08/06/2013  . Abnormal CT scan, chest 08/09/2012   Past Medical History: Past Medical History:  Diagnosis Date  . Allergic rhinitis   . Anxiety   . Aortic stenosis   . Arthritis   . CAO (chronic airflow obstruction) (H[facility])   . Depressive disorder   . Gout   . HTN (hypertension)   . Hyperlipidemia   . Hypertriglyceridemia   . Kidney disease    Dr. Graylon Gunning  . Menopausal symptoms   . Mitral and aortic heart valve diseases, unspecified March 2016   s/p AVR with #19 St Jude and s/p MVR with 46mm St. Jude per [name] at [facility]  . Mitral stenosis   . Morbid obesity ([place])   .  Noninfectious lymphedema   . Renal insufficiency   . Shortness of breath    with exertion  . Sleep apnea   . Undiagnosed cardiac murmurs   . Vitamin D deficiency    Past Surgical History: Past Surgical History:  Procedure Laterality Date  . CARDIOVERSION N/A 09/19/2014   Procedure: CARDIOVERSION;  Surgeon: Sanda Klein, MD;  Location: [facility];  Service: Cardiovascular;  Laterality: N/A;  . LUNG BIOPSY Left 10/03/2013   Procedure: Left Lung Biopsy;  Surgeon: Melrose Nakayama, MD;  Location: [facility];  Service: Thoracic;  Laterality: Left;  . TONSILLECTOMY    . VIDEO ASSISTED THORACOSCOPY Left 10/03/2013   Procedure: Left Video Assited Thoracoscopy;  Surgeon: Melrose Nakayama, MD;  Location: [facility];  Service: Thoracic;  Laterality: Left;  Marland Kitchen VIDEO BRONCHOSCOPY Bilateral  10/25/2012   Procedure: VIDEO BRONCHOSCOPY WITH FLUORO;  Surgeon: Kathee Delton, MD;  Location: [facility];  Service: Cardiopulmonary;  Laterality: Bilateral;   Social History: Social History  Substance Use Topics  . Smoking status: Former Smoker    Packs/day: 1.00    Years: 35.00    Types: Cigarettes    Quit date: 10/03/2013  . Smokeless tobacco: Never Used  . Alcohol use No   Additional social history: Lives at home with sister.  Former smoker, quit May 2015, used to smoke 1 PPD.  No drug use or alcohol use.   Please also refer to relevant sections of EMR.  Family History: Family History  Problem Relation Age of Onset  . Emphysema Mother   . Cancer Mother        throat  . Hypertension Mother   . Dementia Mother   . Heart disease Father        valve replacement  . Kidney disease Father   . Hypertension Father   . Kidney failure Father        dialysis  . Hypertension Sister   . Hypertension Brother   . Diabetes Brother   . Stroke Brother   . Heart attack Neg Hx     Allergies and Medications: No Known Allergies No current facility-administered medications on file prior to encounter.    Current Outpatient Prescriptions on File Prior to Encounter  Medication Sig Dispense Refill  . acetaminophen (TYLENOL) 650 MG CR tablet Take 1,300 mg by mouth 2 (two) times daily as needed for pain.     Marland Kitchen albuterol (PROVENTIL HFA;VENTOLIN HFA) 108 (90 Base) MCG/ACT inhaler Inhale 2 puffs into the lungs 2 (two) times daily as needed for wheezing or shortness of breath. Reported on 06/20/2015 3.7 g 5  . albuterol (PROVENTIL) (2.5 MG/3ML) 0.083% nebulizer solution Inhale 3 mLs (2.5 mg total) into the lungs every 4 (four) hours as needed. (Patient taking differently: Inhale 2.5 mg into the lungs every 4 (four) hours as needed for wheezing or shortness of breath. ) 150 mL 5  . allopurinol (ZYLOPRIM) 300 MG tablet Take 300 mg by mouth daily.    Marland Kitchen aspirin EC 81 MG tablet Take 81 mg by mouth  daily.    . budesonide-formoterol (SYMBICORT) 80-4.5 MCG/ACT inhaler Inhale 2 puffs into the lungs 2 (two) times daily. Reported on 06/20/2015 (Patient taking differently: Inhale 2 puffs into the lungs every evening. Reported on 06/20/2015) 1 Inhaler 11  . buPROPion (WELLBUTRIN XL) 300 MG 24 hr tablet Take 300 mg by mouth daily.     . cephALEXin (KEFLEX) 500 MG capsule Take 1 capsule (500 mg total) by mouth 2 (two) times  daily. 10 capsule 0  . cetirizine (ZYRTEC) 10 MG tablet Take 1 tablet (10 mg total) by mouth daily. (Patient taking differently: Take 10 mg by mouth at bedtime. ) 30 tablet 5  . Cholecalciferol (VITAMIN D3) 2000 UNITS TABS Take 2,000 Units by mouth daily.     . citalopram (CELEXA) 20 MG tablet Take 20 mg by mouth daily.     . furosemide (LASIX) 80 MG tablet Take 80 mg by mouth daily.    Marland Kitchen losartan (COZAAR) 50 MG tablet Take 1 tablet (50 mg total) by mouth daily. 90 tablet 3  . metoprolol (LOPRESSOR) 50 MG tablet Take 50 mg by mouth 2 (two) times daily.    . montelukast (SINGULAIR) 10 MG tablet Take 10 mg by mouth daily.     . pravastatin (PRAVACHOL) 80 MG tablet Take 80 mg by mouth daily.     Marland Kitchen triamcinolone (NASACORT AQ) 55 MCG/ACT AERO nasal inhaler Place 2 sprays into the nose daily. (Patient taking differently: Place 2 sprays into the nose daily as needed (for seasonal allergies). ) 1 Inhaler 5  . warfarin (COUMADIN) 7.5 MG tablet TAKE 1 TABLET BY MOUTH DAILY OR AS DIRECTED BY COUMADIN CLINIC (Patient taking differently: Take 3.75-7.5 mg by mouth See admin instructions. 7.5 mg at bedtime on Sun/Tues/Thurs/Sat and 3.75 mg on Mon/Wed/Fri) 90 tablet 0    Objective: BP (!) 101/51   Pulse 62   Temp 98.7 F (37.1 C) (Oral)   Resp 15   SpO2 92%  Exam: General: Patient is a pleasant obese female sitting up in bed. Patient is receiving O2 via nasal canula  Eyes: Pupils equal round and reactive to light, extra-ocular movements intact  ENTM: moist mucous membranes  Cardiovascular:  RRR, slightly systolic murmur, mechanical valve click, 2+ pulses in radial pulses bilaterally, thready pedal pulses bilaterally  Respiratory: Course crackles bilaterally, no use of accessory muscles to breath  Gastrointestinal: bowel sounds heard in all 4 quadrants, soft, non tender, non distended  MSK: full range of motion in all four extremities, 5+ in upper and lower extremities  Extremities: R leg lymphadema and non pitting edema. L leg 2+ pitting up to thigh and erythematous. Left leg is warm and tender to palpation. No calf tenderness  Neuro: equal sensation bilaterally.     Labs and Imaging: CBC BMET   Recent Labs Lab 11/30/16 1326  WBC 9.0  HGB 15.7*  HCT 51.5*  PLT 205    Recent Labs Lab 11/30/16 1326  NA 135  K 5.3*  CL 98*  CO2 29  BUN 27*  CREATININE 1.57*  GLUCOSE 87  CALCIUM 9.8     DG Chest 2 View CLINICAL DATA:  56 year old female with acute shortness of breath for 2-3 weeks. History of cardiac valve replacement.EXAM: CHEST  2 VIEW COMPARISON:  06/08/2016 and prior radiographsFINDINGS: Cardiomegaly and cardiac valve replacement changes again noted.Mild interstitial opacities have minimally increased likely representing mild interstitial pulmonary edema.Left basilar scarring again noted.No pleural effusions, pneumothorax or pulmonary mass noted.No acute bony abnormalities identified. IMPRESSION: Cardiomegaly with mild interstitial opacities likely representing mild interstitial edema. Electronically Signed   By: Margarette Canada M.D.   On: 11/30/2016 13:48   BNP 221  Tropoinin I stat 0.0  INR 2.37   Caroline More, DO 11/30/2016 6:53 PM PGY-1, [place] Intern pager: [phone], text pages welcome  UPPER LEVEL ADDENDUM  I have read the above note and made revisions highlighted in blue.  Kerrin Mo, MD, PGY-2  [facility]

## 2016-11-30 NOTE — ED Notes (Signed)
Pt states that she is unsure if her SOB is worse than usual. Pt on 3L home O2.

## 2016-11-30 NOTE — Telephone Encounter (Signed)
Spoke with Dr. Curt Bears (DOD). His recommendation is for pt to report to the emergency department instead of coming to our office today.   Called, spoke with [name], NP at [facility]. Informed above recommendation. She stated pt left already. Asked me to please call pt to inform.

## 2016-11-30 NOTE — ED Notes (Signed)
Admitting at bedside 

## 2016-11-30 NOTE — Telephone Encounter (Signed)
Spoke with [name], NP at [facility] [name], NP requesting pt to be seen at our office today. Pt has weeping edema left leg. Pt O2 sat (on 3L O2) upon exertion (while walking) 84%. Pt on Lasix 80 mg daily.

## 2016-11-30 NOTE — Telephone Encounter (Signed)
Never seen this paient and not sure I am her cardiologist.

## 2016-11-30 NOTE — ED Notes (Signed)
6N unable to take report at this time

## 2016-11-30 NOTE — ED Triage Notes (Signed)
Pt reports she is here with c/o fluid retention to "mostly leg leg but also all over". He is currently taking Keflex for cellulitis to left leg as well. She reports feeling SOB, denies CP. She states she usually wears 2.5L of O2 at home but has increased to 3L due to increased SOB.

## 2016-12-01 DIAGNOSIS — E8770 Fluid overload, unspecified: Secondary | ICD-10-CM

## 2016-12-01 DIAGNOSIS — L03111 Cellulitis of right axilla: Secondary | ICD-10-CM

## 2016-12-01 LAB — CBC
HCT: 47.6 % — ABNORMAL HIGH (ref 36.0–46.0)
Hemoglobin: 14.4 g/dL (ref 12.0–15.0)
MCH: 29.6 pg (ref 26.0–34.0)
MCHC: 30.3 g/dL (ref 30.0–36.0)
MCV: 97.9 fL (ref 78.0–100.0)
PLATELETS: 182 10*3/uL (ref 150–400)
RBC: 4.86 MIL/uL (ref 3.87–5.11)
RDW: 15.9 % — AB (ref 11.5–15.5)
WBC: 7.1 10*3/uL (ref 4.0–10.5)

## 2016-12-01 LAB — PROTIME-INR
INR: 2.34
Prothrombin Time: 26.1 seconds — ABNORMAL HIGH (ref 11.4–15.2)

## 2016-12-01 LAB — BASIC METABOLIC PANEL
Anion gap: 10 (ref 5–15)
BUN: 25 mg/dL — AB (ref 6–20)
CALCIUM: 9.3 mg/dL (ref 8.9–10.3)
CHLORIDE: 99 mmol/L — AB (ref 101–111)
CO2: 28 mmol/L (ref 22–32)
CREATININE: 1.43 mg/dL — AB (ref 0.44–1.00)
GFR calc Af Amer: 46 mL/min — ABNORMAL LOW (ref >60)
GFR calc non Af Amer: 40 mL/min — ABNORMAL LOW (ref >60)
GLUCOSE: 80 mg/dL (ref 65–99)
Potassium: 4.5 mmol/L (ref 3.5–5.1)
Sodium: 137 mmol/L (ref 135–145)

## 2016-12-01 LAB — HIV ANTIBODY (ROUTINE TESTING W REFLEX): HIV Screen 4th Generation wRfx: NONREACTIVE

## 2016-12-01 LAB — TROPONIN I: Troponin I: <0.03 ng/mL (ref <0.03)

## 2016-12-01 MED ORDER — DOXYCYCLINE HYCLATE 100 MG PO TABS
100.0000 mg | ORAL_TABLET | Freq: Two times a day (BID) | ORAL | Status: DC
  Administered 2016-12-01 – 2016-12-03 (×4): 100 mg via ORAL
  Filled 2016-12-01 (×4): qty 1

## 2016-12-01 MED ORDER — FUROSEMIDE 10 MG/ML IJ SOLN
60.0000 mg | Freq: Once | INTRAMUSCULAR | Status: AC
  Administered 2016-12-01: 60 mg via INTRAVENOUS
  Filled 2016-12-01: qty 6

## 2016-12-01 MED ORDER — WARFARIN - PHARMACIST DOSING INPATIENT: Freq: Every day | Status: DC

## 2016-12-01 MED ORDER — WARFARIN SODIUM 5 MG PO TABS
7.5000 mg | ORAL_TABLET | Freq: Once | ORAL | Status: AC
  Administered 2016-12-01: 7.5 mg via ORAL
  Filled 2016-12-01: qty 2

## 2016-12-01 NOTE — Progress Notes (Signed)
Family Medicine Teaching Service Daily Progress Note Intern Pager: [phone]  Patient name: [name] Medical record number: [id number] Date of birth: Feb 15, 1961 Age: 56 y.o. Gender: female  Primary Care Provider: [name], FNP Consultants:  Code Status: Full  Pt Overview and Major Events to Date:  [name] is a 56 y.o. Female presenting with worsening SOB and left lower extremity cellulitis. Was admitted to [facility] on 11/30/16.   Assessment and Plan:   Shortness of Breath On admission, patient reports SOB x2 weeks, ECHO on March 2018 shows EF of 84% and diastolic dysfunction. Patient with history of aortic and mitral mechanical valve replacement followed by cardiology. On exam patient appeared to be fluid overloaded, but improved from admission. CXR on admission showed mild interstitial pulmonary edema, 2+ pitting edema in left leg. Right leg showed edema, attested to history of lymphedema. Crackles were auscultated at bases bilaterally. BNP of 221.3 on admission. Consider ACS due to high risk factors, though unlikely given lack of chest pain or other symptoms. EKG on 7/3 was unchanged from previus- t-wave inversion in only v1 lead similar to previous EKGs. Troponins negative x3. Consider COPD exacerbation. Less likely due to negative symptoms of cough or congestion. Consider PE thought this less likely with therapeutic INR of 2.37 on 7/3 and patient with a recent negative Doppler ultrasound on 6/30, though limited due to body habitus. Due to patient BMI likely a component of obesity hypoventilation syndrome and  fluid overload. Last weight in February patient was 324 lbs. Current weight is 334 lbs. Net negative output from admission to present time is -2600 mL.  -Strict I&Os  -one dose lasix 60 mg IV on 7/4 -Daily weights -monitor vitals per shift   -repeat echo -if worsening symptoms consider CTA to rule out PE  Left lower extremity Cellulitis Patient presented with left  lower leg edema and erythema, failed outpatient keflex, no signs of pus therefore less likely staph. No signs of systemic infection with normal WBC and normal vitals. Left leg showed improvement, but was still slightly warm to palpation. 2+ pitting on exam, but skin showed signs of decreased fluid. Recently received a doppler ultrasound of 6/30 negative for DVT. Patient states she is able to ambulate normally. Blood cultures showed no growth x12 hours.    -continue IV clindamycin for 5-14 days -elevate leg  -monitor blood cultures x 2 (recieved 1 dose of clindamycin in the ED prior to blood cultures) -continue to monitor   ILD/COPD Followed by pulmonology. Currently on 2-3 L O2 at home. Crackles auscultated at bases bilaterally. Patient denies cough or congestion.  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 Cr 1.57 on admission. Per chart review baseline Cr is ~1.3.   FEN/GI: heart healthy diet  PPx: warfarin 3.75-7.54 mg  Disposition: Patient is improving   Subjective:   [name] is a 56 yo female presenting with worsening SOB x 2 weeks and cellulitis of left leg x 3 weeks. PMH significant for interstitial lung disease, HLD, history of mitral/aortic valve replacement with mechanical valve, COPD, CKD stage III, HTN. Patient currently states that she is feeling much better today. She states that breathing has improved and has been weaned down to 2.5 L O2 nasal canula, from 3.0 L O2 in ED. Patient states that leg  has decreased in tenderness and warmth, and feels "less tight". Patient denies pain. Patient denies fever, nausea, vomiting, chills, chest pain, or cough.   Objective: Temp:  [97.8 F (36.6 C)-98.7 F (37.1 C)] 97.8 F (36.6 C) (07/04 0423) Pulse Rate:   [54-63] 60 (07/04 0423) Resp:  [15-22] 19 (07/04 0423) BP: (101-155)/(49-76) 111/53 (07/04 0423) SpO2:  [89 %-92 %] 92 % (07/04 0423) FiO2 (%):  [3 %] 3 % (07/03 1744) Weight:  [334 lb 3.2 oz (151.6 kg)-341 lb 12.8 oz (155 kg)] 334 lb 3.2 oz (151.6 kg) (07/04 0500) Physical Exam: General: NAD, well appearing obese female sitting up in bed wearing nasal canula  Cardiovascular: RRR, slight systolic murmur, mechanical valve click, 2+ pulses in radial pulses, thready pedal pulses bilaterally  Respiratory: Crackles heard at bases bilaterally, no use of accessory muscles to breath   Abdomen: bowel sounds heard in all 4 quadrants, soft, non tender, non distended  Extremities: R leg lymphedema. L leg 2+ pitting edema pitting up to knee. L leg erythematous. L leg is warm and tender to palpation. No calf tenderness.   Laboratory:  Recent Labs Lab 11/26/16 2056 11/30/16 1326 12/01/16 0733  WBC 8.6 9.0 7.1  HGB 15.1* 15.7* 14.4  HCT 47.9* 51.5* 47.6*  PLT 203 205 182    Recent Labs Lab 11/26/16 2056 11/30/16 1326 12/01/16 0733  NA 134* 135 137  K 5.1 5.3* 4.5  CL 96* 98* 99*  CO2 30 29 28   BUN 25* 27* 25*  CREATININE 1.45* 1.57* 1.43*  CALCIUM 9.4 9.8 9.3  PROT 7.6  --   --   BILITOT 0.8  --   --   ALKPHOS 68  --   --   ALT 20  --   --   AST 29  --   --   GLUCOSE 97 87 80     Imaging/Diagnostic Tests: DG Chest 2 View CLINICAL DATA:  56 year old female with acute shortness of breath for 2-3 weeks. History of cardiac valve replacement. EXAM: CHEST  2 VIEW COMPARISON:  06/08/2016 and prior radiographs FINDINGS: Cardiomegaly and cardiac valve replacement changes again noted. Mild interstitial opacities have minimally increased likely representing mild interstitial pulmonary edema. Left basilar scarring again noted. No pleural effusions, pneumothorax or pulmonary mass noted. No acute bony abnormalities identified. IMPRESSION: Cardiomegaly with mild interstitial opacities  likely representing mild interstitial edema. Electronically Signed   By: Margarette Canada M.D.   On: 11/30/2016 13:48  Caroline More, DO 12/01/2016, 12:31 PM PGY-1, [place] Intern pager: [phone], text pages welcome

## 2016-12-01 NOTE — Discharge Summary (Signed)
[facility] Discharge Summary  Patient name: [name] Medical record number: [id number] Date of birth: Jan 31, 1961 Age: 56 y.o. 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 Female with PMH significant for interstitial lung disease, HLD, history of mitral/aortic valve replacement with mechanical valve, COPD, CKD stage III, HTN who presented to the ED with worsening SOB and LLE cellulitis. Patient had failed outpatient keflex and was given 3 doses of 600 mg clindamycin in ED. Patient was transitioned to oral doxycycline 100 mg bid on 7/4. Patients BNP on admission was 221.3. CXR on admission showed mild interstitial pulmonary edema. Patient currently takes 80 mg PO lasix at home and was given 2 total doses of 60 mg IV lasix after admission. Patient has been diuresing adequately. Patient received 3 total doses of IV clindamycin and was transition to oral doxycycline 100 mg bid to be taken a total of 10 days. Patient was tolerating oral antibiotics well and showed improvement in LLE cellulitis. Prior to discharge patient received a repeat echo, results are pending.  Issues for Follow Up:  1. Continue oral lasix 80 mg bid 2. Continue antibiotic course of doxycycline 100 mg bid for a total of 10 days 3. F/u with PCP regarding SOB on admission and LLE cellulitis   Significant Procedures:    Significant Labs and Imaging:   Recent Labs Lab 11/30/16 1326 12/01/16 0733 12/03/16 0507  WBC 9.0 7.1 4.4  HGB 15.7* 14.4 15.8*  HCT 51.5* 47.6* 52.2*  PLT 205 182 176    Recent Labs Lab 11/26/16 2056 11/30/16 1326 12/01/16 0733  NA 134* 135 137  K 5.1 5.3* 4.5  CL 96* 98* 99*  CO2 30 29 28   GLUCOSE 97 87 80  BUN 25* 27* 25*  CREATININE 1.45* 1.57* 1.43*  CALCIUM 9.4 9.8 9.3  ALKPHOS 68  --   --   AST 29  --   --   ALT 20  --   --   ALBUMIN 4.1  --   --      DG Chest 2 View CLINICAL DATA: 56 year old female with acute shortness of breath for 2-3 weeks. History of cardiac valve replacement. EXAM: CHEST 2 VIEW COMPARISON: 06/08/2016 and prior radiographs FINDINGS: Cardiomegaly and cardiac valve replacement changes again noted. Mild interstitial opacities have minimally increased likely representing mild interstitial pulmonary edema. Left basilar scarring again noted. No pleural effusions, pneumothorax or pulmonary mass noted. No acute bony abnormalities identified. IMPRESSION: Cardiomegaly with mild interstitial opacities likely representing mild interstitial edema. Electronically Signed By: Margarette Canada M.D. On: 11/30/2016 13:48  Results/Tests Pending at Time of Discharge:  Echocardiogram   Discharge Medications:  Allergies as of 12/03/2016   No Known Allergies     Medication List    STOP taking these medications   cephALEXin 500 MG capsule Commonly known as:  KEFLEX     TAKE these medications   acetaminophen 650 MG CR tablet Commonly known as:  TYLENOL  Take 1,300 mg by mouth 2 (two) times daily as needed for pain.   albuterol 108 (90 Base) MCG/ACT inhaler Commonly known as:  PROVENTIL HFA;VENTOLIN HFA Inhale 2 puffs into the lungs 2 (two) times daily as needed for wheezing or shortness of breath. Reported on 06/20/2015 What changed:  Another medication with the same name was changed. Make sure you understand how and when to take  each.   albuterol (2.5 MG/3ML) 0.083% nebulizer solution Commonly known as:  PROVENTIL Inhale 3 mLs (2.5 mg total) into the lungs every 4 (four) hours as needed. What changed:  reasons to take this   allopurinol 300 MG tablet Commonly known as:  ZYLOPRIM Take 300 mg by mouth daily.   aspirin EC 81 MG tablet Take 81 mg by mouth daily.   budesonide-formoterol 80-4.5 MCG/ACT inhaler Commonly known as:  SYMBICORT Inhale 2 puffs into the lungs 2 (two) times daily. Reported on 06/20/2015 What changed:  when to take this  additional instructions   buPROPion 300 MG 24 hr tablet Commonly known as:  WELLBUTRIN XL Take 300 mg by mouth daily.   cetirizine 10 MG tablet Commonly known as:  ZYRTEC Take 1 tablet (10 mg total) by mouth daily. What changed:  when to take this   citalopram 20 MG tablet Commonly known as:  CELEXA Take 20 mg by mouth daily.   doxycycline 100 MG tablet Commonly known as:  VIBRA-TABS Take 1 tablet (100 mg total) by mouth every 12 (twelve) hours.   furosemide 80 MG tablet Commonly known as:  LASIX Take 1 tablet (80 mg total) by mouth 2 (two) times daily. What changed:  when to take this   losartan 50 MG tablet Commonly known as:  COZAAR Take 1 tablet (50 mg total) by mouth daily.   metoprolol tartrate 50 MG tablet Commonly known as:  LOPRESSOR Take 50 mg by mouth 2 (two) times daily.   montelukast 10 MG tablet Commonly known as:  SINGULAIR Take 10 mg by mouth daily.   pravastatin 80 MG tablet Commonly known as:  PRAVACHOL Take 80 mg by mouth daily.   triamcinolone 55 MCG/ACT Aero nasal inhaler Commonly known as:  NASACORT AQ Place 2 sprays into the nose daily. What changed:  when to take this  reasons to take this   Vitamin D3 2000 units Tabs Take 2,000 Units by mouth daily.   warfarin 7.5 MG tablet Commonly known as:  COUMADIN TAKE 1 TABLET BY MOUTH DAILY OR AS DIRECTED BY COUMADIN CLINIC What changed:  how much to take  how to  take this  when to take this  additional instructions       Discharge Instructions: Please refer to Patient Instructions section of EMR for full details.  Patient was counseled important signs and symptoms that should prompt return to medical care, changes in medications, dietary instructions, activity restrictions, and follow up appointments.   Follow-Up Appointments: Follow-up Information    [name], [place]. Schedule an appointment as soon as possible for a visit in 2 day(s).   Specialty:  Nurse Practitioner Contact information: [address] [place] [address] [place], [place], DO 12/03/2016, 11:50 AM PGY-1, [facility]

## 2016-12-01 NOTE — Progress Notes (Addendum)
Went by to see Ms. Purnell today. States that she is overall feeling better and feels like diuretics are working well. Patient states that valve replacement surgery was done in March of 2016 and was told by [name] at [facility] that reasoning for procedure was Rheumatic fever. Patient has no concerns or further questions at this time. Patients lung sounds showed improvement with crackles at base of left lung. Was on 3L O2. Patients left lower leg showed signs of improvement. Appeared less warm to palpation and skin showed signs of decreased edema. Will transition from IV clindamycin to oral doxycycline 100 mg bid.   Dalphine Handing, PGY-1 [facility]  12/01/2016 2:52 PM

## 2016-12-01 NOTE — Progress Notes (Signed)
ANTICOAGULATION CONSULT NOTE - Initial Consult  Pharmacy Consult for warfarin Indication: mechanical valve  No Known Allergies  Patient Measurements: Height: 5' 6.5" (168.9 cm) Weight: (!) 334 lb 3.2 oz (151.6 kg) IBW/kg (Calculated) : 60.45  Assessment: [name] is a 56 y.o. female presenting with worsening SOB x 2 weeks and cellulitis of left leg x 3 weeks. On Coumadin 7.5mg  daily exc for 3.75mg  on MWF for hx mechanical aortic valve. Last INR as outpatient was therapeutic. INR slightly low at 2.34 today. Goal INR of 2.5-3.5. CBC stable.  Goal of Therapy:  INR 2.5-3.5 Monitor platelets by anticoagulation protocol: Yes   Plan:  Give Coumadin 7.5mg  PO x 1 Monitor daily INR, CBC, s/s of bleed May need to consider bridge if INR falls any further  [name], PharmD, BCPS Clinical Pharmacist Pager [phone] 12/01/2016 12:25 PM

## 2016-12-02 ENCOUNTER — Inpatient Hospital Stay (HOSPITAL_COMMUNITY): Payer: Medicare Other

## 2016-12-02 LAB — PROTIME-INR
INR: 2.45
Prothrombin Time: 27 seconds — ABNORMAL HIGH (ref 11.4–15.2)

## 2016-12-02 MED ORDER — DOXYCYCLINE HYCLATE 100 MG PO TABS: 100.0000 mg | ORAL_TABLET | Freq: Two times a day (BID) | ORAL | 0 refills | Status: DC

## 2016-12-02 MED ORDER — FUROSEMIDE 80 MG PO TABS: 80.0000 mg | ORAL_TABLET | Freq: Two times a day (BID) | ORAL | 0 refills | Status: DC

## 2016-12-02 MED ORDER — FUROSEMIDE 80 MG PO TABS
80.0000 mg | ORAL_TABLET | Freq: Two times a day (BID) | ORAL | Status: DC
  Administered 2016-12-02 – 2016-12-03 (×2): 80 mg via ORAL
  Filled 2016-12-02 (×2): qty 1

## 2016-12-02 MED ORDER — WARFARIN SODIUM 5 MG PO TABS
7.5000 mg | ORAL_TABLET | Freq: Once | ORAL | Status: AC
  Administered 2016-12-02: 7.5 mg via ORAL
  Filled 2016-12-02: qty 2

## 2016-12-02 NOTE — Progress Notes (Signed)
Family Medicine Teaching Service Daily Progress Note Intern Pager: [phone]  Patient name: [name] Medical record number: [id number] Date of birth: 19-Dec-1960 Age: 56 y.o. Gender: female  Primary Care Provider: [name], FNP Consultants:  Code Status: Full   Pt Overview and Major Events to Date:  [name] is a 56 yo female presenting with worsening SOB and left lower extremity cellulitis. Was admitted to [facility] on 11/30/16.   Assessment and Plan:  1. Shortness of Breath On admission, patient reports SOB x2 weeks, ECHO on March 2018 shows EF of 83% and diastolic dysfunction. Patient with history of aortic and mitral mechanical valve replacement followed by cardiology. On exam patient appeared to be slightly fluid overloaded, but improved from 7/4. CXR on admission showed mild interstitial pulmonary edema. On exam patient had 2+ pitting edema in left leg. Right leg showed edema, attested to history of lymphedema.Slight crackles were auscultated at Right lung base. BNP of 221.3 on admission. Consider ACS due to high risk factors, though unlikely given lack of chest pain or other symptoms. EKG on 7/3 was unchanged from previus- t-wave inversion in only v1 lead similar to previous EKGs.Troponins negative x3. Consider COPD exacerbation. Less likely due to negative symptoms of cough or congestion.Consider PE thought this less likely with therapeutic INR of 2.45 on 7/5 and patient with a recent negative Doppler ultrasound on 6/30, though limited due to body habitus. Due to patient BMI likely a component of obesity hypoventilation syndrome and  fluid overload. Last weight in February patient was 324 lbs. Weight on 7/4 was 334 lbs. Net negative output from admission to present time is -5620 mL.  -Strict I&Os  -Repeat echo on 7/5 -Daily weights -monitor vitals per shift   -transition to oral lasix 80 mg bid  -if worsening symptoms consider CTA to rule out PE  2. Left lower extremity  Cellulitis Patient presented with left lower leg edema and erythema,failed outpatient keflex, no signs of pus therefore less likely staph. No signs of systemic infection with normal WBC and normal vitals.Left leg showed improvement, and was less warm and tender to palpitation. 2+ pitting on exam, but skin showed signs of decreased fluid. Recently received a doppler ultrasound of 6/30 negative for DVT. Patient states she is able to ambulate without difficulty. Blood cultures showed no growth x12 hours. Patient showed improvement after transition to oral doxycycline.  -oral doxycycline 100 mg bid for total of 10 days, first dose on 7/4 -elevate leg  -monitor blood cultures x 2 (recieved 1 dose of clindamycin in the ED prior to blood cultures) -continue to monitor   3. ILD/COPD Followed by pulmonology. Currently on 2-3 L O2 at home. Currently on 2.5 L O2 in room via nasal canula with O2 sat of 90%. Crackles auscultated at right lung base. Patient denies cough or congestion.  -Continue symbicort and albuterol inhaler prn   4. History of Mechanical Valve replacement Aortic/Mitral valve replacement 2/2 Rheumatic Disease Per chart review physician at [facility] stated reasoning for procedure was rheumatic disease. PCP office stated that reasoning was for mitral stenosis and aortic stenosis. INR of 2.45, with goal of 2.5-3.5 -Continue warfarin -monitor daily INR  5. Acute on Chronic Kidney Disease 3 Cr of 1.43 on 7/4. Cr 1.57 on admission. Per chart review baseline Cr is ~1.3.   FEN/GI: Heart healthy diet  PPx: Warfarin 3.75-7.54 mg   Disposition: improving  Subjective:  [name] is a 56 yo female presenting with worsening SOB x  2 weeks and cellulitis of left leg x 3 weeks. PMH significant for interstitial lung disease, HLD, history of mitral/aortic valve replacement with mechanical valve, COPD, CKD stage III, HTN. Overall patient states she feels better and has marked decrease in  tenderness and redness in LLE. Patient states that she is breathing well on 2.5 L O2 via nasal canula. Patient states he is doing well on oral doxycycline and denies diarrhea. Patient denies SOB, cough, congestion, pain, fever/chills, or nausea and vomiting.   Objective: Temp:  [98.2 F (36.8 C)-98.3 F (36.8 C)] 98.2 F (36.8 C) (07/05 0520) Pulse Rate:  [58-65] 58 (07/05 0520) Resp:  [15-17] 17 (07/05 0520) BP: (120-133)/(64-68) 131/64 (07/05 0520) SpO2:  [93 %-96 %] 94 % (07/05 0921) Physical Exam: General: NAD, well appearing obese female, awake and alert  Cardiovascular: RRR, slight systolic murmur, mechanical valve click, 2+ pulses in radial pulses, thready pedal pulses bilaterally  Respiratory: Crackles heard in Right base, no use of accessory muscles to breath  Abdomen: soft, non tender, non distended, bowel sounds heard in all 4 quadrants  Extremities: R leg lymphedema. Left leg showing 2+ pitting edema up to knee and erythema. Left leg is slightly warm to palpation.   Laboratory:  Recent Labs Lab 11/26/16 2056 11/30/16 1326 12/01/16 0733  WBC 8.6 9.0 7.1  HGB 15.1* 15.7* 14.4  HCT 47.9* 51.5* 47.6*  PLT 203 205 182    Recent Labs Lab 11/26/16 2056 11/30/16 1326 12/01/16 0733  NA 134* 135 137  K 5.1 5.3* 4.5  CL 96* 98* 99*  CO2 30 29 28   BUN 25* 27* 25*  CREATININE 1.45* 1.57* 1.43*  CALCIUM 9.4 9.8 9.3  PROT 7.6  --   --   BILITOT 0.8  --   --   ALKPHOS 68  --   --   ALT 20  --   --   AST 29  --   --   GLUCOSE 97 87 80    Imaging/Diagnostic Tests: DG Chest 2 View CLINICAL DATA: 56 year old female with acute shortness of breath for 2-3 weeks. History of cardiac valve replacement. EXAM: CHEST 2 VIEW COMPARISON: 06/08/2016 and prior radiographs FINDINGS: Cardiomegaly and cardiac valve replacement changes again noted. Mild interstitial opacities have minimally increased likely representing mild interstitial pulmonary edema. Left basilar scarring  again noted. No pleural effusions, pneumothorax or pulmonary mass noted. No acute bony abnormalities identified. IMPRESSION: Cardiomegaly with mild interstitial opacities likely representing mild interstitial edema. Electronically Signed By: Margarette Canada M.D. On: 11/30/2016 13:48  Caroline More, DO 12/02/2016, 11:51 AM PGY-1, [place] Intern pager: [phone], text pages welcome

## 2016-12-02 NOTE — Progress Notes (Signed)
ANTICOAGULATION CONSULT NOTE - Initial Consult  Pharmacy Consult for warfarin Indication: mechanical valve  No Known Allergies  Patient Measurements: Height: 5' 6.5" (168.9 cm) Weight: (!) 334 lb 3.2 oz (151.6 kg) IBW/kg (Calculated) : 60.45  Assessment: [name] is a 56 y.o. female presenting with worsening SOB x 2 weeks and cellulitis of left leg x 3 weeks. On Coumadin 7.5mg  daily exc for 3.75mg  on MWF for hx mechanical aortic valve. Last INR as outpatient was therapeutic. INR slightly low but up to 2.45 today. Goal INR of 2.5-3.5. CBC stable. Started on doxy, so monitor INR closely.  Goal of Therapy:  INR 2.5-3.5 Monitor platelets by anticoagulation protocol: Yes   Plan:  Give Coumadin 7.5mg  PO x 1 Monitor daily INR, CBC, s/s of bleed May need to consider bridge if INR trends down  [name], PharmD, BCPS Clinical Pharmacist Pager [phone] 12/02/2016 10:16 AM

## 2016-12-03 ENCOUNTER — Inpatient Hospital Stay (HOSPITAL_COMMUNITY): Payer: Medicare Other

## 2016-12-03 ENCOUNTER — Telehealth: Payer: Self-pay | Admitting: Pulmonary Disease

## 2016-12-03 DIAGNOSIS — R0600 Dyspnea, unspecified: Secondary | ICD-10-CM

## 2016-12-03 LAB — PROTIME-INR
INR: 2.91
Prothrombin Time: 31 seconds — ABNORMAL HIGH (ref 11.4–15.2)

## 2016-12-03 LAB — CBC
HEMATOCRIT: 52.2 % — AB (ref 36.0–46.0)
HEMOGLOBIN: 15.8 g/dL — AB (ref 12.0–15.0)
MCH: 30.1 pg (ref 26.0–34.0)
MCHC: 30.3 g/dL (ref 30.0–36.0)
MCV: 99.4 fL (ref 78.0–100.0)
Platelets: 176 10*3/uL (ref 150–400)
RBC: 5.25 MIL/uL — AB (ref 3.87–5.11)
RDW: 16.1 % — ABNORMAL HIGH (ref 11.5–15.5)
WBC: 4.4 10*3/uL (ref 4.0–10.5)

## 2016-12-03 LAB — ECHOCARDIOGRAM COMPLETE
Height: 66.5 in
Weight: 5347.2 oz

## 2016-12-03 MED ORDER — WARFARIN SODIUM 2.5 MG PO TABS
3.7500 mg | ORAL_TABLET | Freq: Once | ORAL | Status: DC
  Filled 2016-12-03: qty 1

## 2016-12-03 MED ORDER — PERFLUTREN LIPID MICROSPHERE
1.0000 mL | INTRAVENOUS | Status: AC | PRN
  Administered 2016-12-03: 3 mL via INTRAVENOUS
  Filled 2016-12-03: qty 10

## 2016-12-03 NOTE — Progress Notes (Cosign Needed)
Family Medicine Teaching Service Daily Progress Note Intern Pager: [phone]  Patient name: [name] Medical record number: [id number] Date of birth: 05-22-1961 Age: 56 y.o. Gender: female  Primary Care Provider: [name], FNP Consultants:  Code Status: Full   Pt Overview and Major Events to Date:  Ms. Apsey is a 56 yo female presenting with worsening SOB and left lower extremity cellulitis. Was admitted to [facility] on 11/30/16.   Assessment and Plan:  1. Shortness of Breath On admission, patient reports SOB x2 weeks, ECHO on March 2018 shows EF of 83%TDV diastolic dysfunction. Patient with historyof aortic and mitral mechanical valve replacement followed by cardiology. On exam patient appeared to be slightly fluid overloaded, but improved from 7/5. CXR on admission showed mild interstitial pulmonary edema. On exam patient had 1+pitting edema in left leg. Right leg showed edema, attested to history of lymphedema.Lungs were CTAB.BNP of 221.3 on admission.Consider ACS due to high risk factors, though unlikely given lack of chest pain or other symptoms. EKG on 7/3 was unchanged from previus- t-wave inversion in only v1 lead similar to previous EKGs.Troponins negative x3. Consider COPD exacerbation. Less likely due to negative symptoms of cough or congestion.Consider PE thought this less likely with therapeutic INR of 2.91 on 7/6 and patient with a recent negative Doppler ultrasound on 6/30, though limited due to body habitus. Due to patient BMI likely a component of obesity hypoventilation syndrome and fluid overload. Last weight in February patient was 324 lbs. Weight on 7/4 was 334 lbs. Net negative output from admission to present time is -6580 mL.  -Strict I&Os  -f/u on echo from 7/6 -Daily weights -monitor vitals per shift  -continue oral lasix 80 mg bid  -if worsening symptoms consider CTA to rule out PE  2. Left lower extremity Cellulitis Patient presented with  left lower leg edema and erythema,failed outpatient keflex, no signs of pus therefore less likely staph. No signs of systemic infection with normal WBC and normal vitals.Left leg showed improvement, and was less warm and tender to palpitation. 1+ pitting on exam, but skin showed signs of decreased fluid. Recently received a doppler ultrasound of 6/30 negative for DVT. Patient is able to ambulate without difficulty. Blood cultures showed no growth x2 days. Patient showed improvement after transition to oral doxycycline.  -oral doxycycline 100 mg bid for total of 10 days, first dose on 7/4, continue to monitor INR -elevate leg  -continue to monitor   3. ILD/COPD Followed by pulmonology. Currently on 2-3 L O2 at home. Currently on 2.5 L O2 in room via nasal canula with O2 sat of 95%. Lungs CTAB. Patientdenies cough or congestion.  -Continue symbicort and albuterol inhaler prn   4. History of Mechanical Valve replacement Aortic/Mitral valve replacement 2/2 Rheumatic Disease Per chart review physician at [facility] stated reasoning for procedure was rheumatic disease. PCP office stated that reasoning was for mitral stenosis and aortic stenosis. INR of 2.91, with goal of 2.5-3.5 -Continue warfarin -monitor daily INR   5. Acute on Chronic Kidney Disease 3 Cr of 1.43 on 7/4. Cr 1.57on admission.Per chart review baselineCr is ~1.3.   FEN/GI: Heart healthy diet  PPx: Warfarin 3.75-7.54 mg   Disposition: stable, improved  Subjective:  [name] is a 56 yo female presenting with worsening SOB x 2 weeks and cellulitis of left leg x 3 weeks. PMH significant for interstitial lung disease, HLD, history of mitral/aortic valve replacement with mechanical valve, COPD, CKD stage III, HTN.   Objective:  Temp:  [97.9 F (36.6 C)-98.4 F (36.9 C)] 97.9 F (36.6 C) (07/06 0529) Pulse Rate:  [53-64] 57 (07/06 0900) Resp:  [18-20] 18 (07/06 0529) BP: (101-146)/(51-72) 133/72 (07/06  0900) SpO2:  [92 %-95 %] 92 % (07/06 0941) Physical Exam: General: NAD, patient is well appearing and sitting up in bed, awake and alert Cardiovascular: RRR, no MRG, 2+ radial pulses bilaterally, thready pedal pulses  Respiratory: CTAB, no wheezes, rales, or rhonchi  Abdomen: soft, non tender, non distended, bowel sounds in all four quadrants  Extremities: R lower extremity lyphaedema, LLE shows erythema and 1+ pitting edema   Laboratory:  Recent Labs Lab 11/30/16 1326 12/01/16 0733 12/03/16 0507  WBC 9.0 7.1 4.4  HGB 15.7* 14.4 15.8*  HCT 51.5* 47.6* 52.2*  PLT 205 182 176    Recent Labs Lab 11/26/16 2056 11/30/16 1326 12/01/16 0733  NA 134* 135 137  K 5.1 5.3* 4.5  CL 96* 98* 99*  CO2 30 29 28   BUN 25* 27* 25*  CREATININE 1.45* 1.57* 1.43*  CALCIUM 9.4 9.8 9.3  PROT 7.6  --   --   BILITOT 0.8  --   --   ALKPHOS 68  --   --   ALT 20  --   --   AST 29  --   --   GLUCOSE 97 87 80    Imaging/Diagnostic Tests: DG Chest 2 View CLINICAL DATA: 56 year old female with acute shortness of breath for 2-3 weeks. History of cardiac valve replacement. EXAM: CHEST 2 VIEW COMPARISON: 06/08/2016 and prior radiographs FINDINGS: Cardiomegaly and cardiac valve replacement changes again noted. Mild interstitial opacities have minimally increased likely representing mild interstitial pulmonary edema. Left basilar scarring again noted. No pleural effusions, pneumothorax or pulmonary mass noted. No acute bony abnormalities identified. IMPRESSION: Cardiomegaly with mild interstitial opacities likely representing mild interstitial edema. Electronically Signed By: Margarette Canada M.D. On: 11/30/2016 13:48  Caroline More, DO 12/03/2016, 11:43 AM PGY-1, [place] Intern pager: [phone], text pages welcome

## 2016-12-03 NOTE — Telephone Encounter (Signed)
Spoke with Lorraine/THN - Pt is needing a HFU to follow up on SOB and Cellulitis. Pt was d/c'd home today from the hospital. A time frame was not specified but given the patient being on antibiotics and having cellulitis Edwena Felty requested an appt "soon". Advised that we would address the SOB but her PCP will have to address the cellulitis -- will let Dr Lake [name] make that decision at the [facility] as Edwena Felty states that she is not able to set up a HFU with PCP because they are not with [facility] under the [facility] umbrella.   Please advise Dr Lake [name] when we can get this patient in for a HFU - there are not available OVs with NPs. Thanks.

## 2016-12-03 NOTE — Progress Notes (Signed)
[facility] for warfarin Indication: mechanical valve  No Known Allergies  Patient Measurements: Height: 5' 6.5" (168.9 cm) Weight: (!) 334 lb 3.2 oz (151.6 kg) IBW/kg (Calculated) : 60.45  Assessment: [name] is a 56 y.o. female presenting with worsening SOB x 2 weeks and cellulitis of left leg x 3 weeks.  On Coumadin 7.5mg  daily exc for 3.75mg  on MWF for hx mechanical aortic valve. Last INR as outpatient was therapeutic  INR slightly low but up to 2.45 today. Goal INR of 2.5-3.5. CBC stable.  Started on doxy, so monitor INR closely.  Goal of Therapy:  INR 2.5-3.5 Monitor platelets by anticoagulation protocol: Yes   Plan:  Give Coumadin 3.75mg  PO x 1 Monitor daily INR, CBC, s/s of bleed  Thank you Anette Guarneri, PharmD [phone] 12/03/2016 10:35 AM

## 2016-12-03 NOTE — Progress Notes (Signed)
[name] to be D/C'd  per MD order. Discussed with the patient and all questions fully answered.  VSS, Skin clean, dry and intact without evidence of skin break down, no evidence of skin tears noted.  IV catheter discontinued intact. Site without signs and symptoms of complications. Dressing and pressure applied.  An After Visit Summary was printed and given to the patient. Patient received prescription.  D/c education completed with patient/family including follow up instructions, medication list, d/c activities limitations if indicated, with other d/c instructions as indicated by MD - patient able to verbalize understanding, all questions fully answered.   Patient instructed to return to ED, call 911, or call MD for any changes in condition.   Patient to be escorted via [place], and D/C home via private auto.  Ready just awaiting ride.

## 2016-12-03 NOTE — Consult Note (Addendum)
           [facility] CM Primary Care Navigator  12/03/2016  [name] 03-Jan-1961 [id number]    Went to see patientat the bedsideto identify possible discharge needs but she was alreadydischarged.  Patient was discharged home today per staffreport.  Patient has a primary care provider listed in EPIC as [name], [place] [facility]. Provider's office with [facility] called and verified that patient had been seen in their office. Spoke with Stanley/Ashtynto notify of patient's discharge and need for post hospital follow-up and transition of care. Reminded of patient's health issues needing follow-up (SOB and LLE cellulitis) as well.  Made aware to refer patient to [facility] care management ifdeemed appropriatefor services.    For questions, please contact:  Dannielle Huh, BSN, RN- [facility] Primary Care Navigator  Telephone: [phone] [place]

## 2016-12-03 NOTE — Progress Notes (Signed)
  Echocardiogram 2D Echocardiogram with definity has been performed.  Darlina Sicilian M 12/03/2016, 8:53 AM

## 2016-12-03 NOTE — Discharge Instructions (Signed)
[name] was admitted for Shortness of Breath and Cellulitis of the left lower leg. Patient was put on IV lasix and IV antibiotics on admission. Patient improved and was transitioned to oral lasix and oral antibiotics. Patient was advised to continue oral lasix 80 mg twice a day at home and continue doxycycline 100 mg twice a day for total of 10 days. Advised to have follow up with PCP.   Please resume home Coumadin as you were taking previously: 7.5 T/Th/Sat/Sun and 3.75 M/W/F

## 2016-12-04 NOTE — Telephone Encounter (Signed)
First available for me or NP

## 2016-12-05 LAB — CULTURE, BLOOD (ROUTINE X 2): Culture: NO GROWTH

## 2016-12-06 NOTE — Telephone Encounter (Signed)
Next available appointment for a HFU with a NP or BQ is not until 01/03/17.  BQ - is this okay?

## 2016-12-06 NOTE — Telephone Encounter (Signed)
OK, keep her in mind if something opens up sooner

## 2016-12-06 NOTE — Telephone Encounter (Signed)
I have spoken with pt and offered apt for 01/03/17 with SG for post hospital. Pt states she will have to call back to schedule, as she watches her granddaughter every other week.

## 2016-12-07 ENCOUNTER — Ambulatory Visit (INDEPENDENT_AMBULATORY_CARE_PROVIDER_SITE_OTHER): Payer: Medicare Other | Admitting: *Deleted

## 2016-12-07 DIAGNOSIS — I350 Nonrheumatic aortic (valve) stenosis: Secondary | ICD-10-CM

## 2016-12-07 LAB — POCT INR: INR: 3.3

## 2016-12-07 NOTE — Telephone Encounter (Signed)
I have spoken with pt's sister, [name] [facility]) who states 01/03/17 should work for pt. Pt has been scheduled for HFU with SG ib 01/03/17 @ 11:15 Nothing further needed.

## 2016-12-15 ENCOUNTER — Ambulatory Visit (INDEPENDENT_AMBULATORY_CARE_PROVIDER_SITE_OTHER): Payer: Medicare Other | Admitting: Pharmacist

## 2016-12-15 DIAGNOSIS — I10 Essential (primary) hypertension: Secondary | ICD-10-CM | POA: Diagnosis not present

## 2016-12-15 DIAGNOSIS — E875 Hyperkalemia: Secondary | ICD-10-CM | POA: Diagnosis not present

## 2016-12-15 DIAGNOSIS — L03116 Cellulitis of left lower limb: Secondary | ICD-10-CM | POA: Diagnosis not present

## 2016-12-15 DIAGNOSIS — I350 Nonrheumatic aortic (valve) stenosis: Secondary | ICD-10-CM

## 2016-12-15 DIAGNOSIS — I482 Chronic atrial fibrillation: Secondary | ICD-10-CM | POA: Diagnosis not present

## 2016-12-15 DIAGNOSIS — J849 Interstitial pulmonary disease, unspecified: Secondary | ICD-10-CM | POA: Diagnosis not present

## 2016-12-15 DIAGNOSIS — E8770 Fluid overload, unspecified: Secondary | ICD-10-CM | POA: Diagnosis not present

## 2016-12-15 LAB — POCT INR: INR: 3.7

## 2017-01-03 ENCOUNTER — Inpatient Hospital Stay: Payer: Medicare Other | Admitting: Acute Care

## 2017-01-03 ENCOUNTER — Ambulatory Visit (INDEPENDENT_AMBULATORY_CARE_PROVIDER_SITE_OTHER): Payer: Medicare Other | Admitting: Pharmacist

## 2017-01-03 DIAGNOSIS — I350 Nonrheumatic aortic (valve) stenosis: Secondary | ICD-10-CM

## 2017-01-03 DIAGNOSIS — E875 Hyperkalemia: Secondary | ICD-10-CM | POA: Diagnosis not present

## 2017-01-03 LAB — POCT INR: INR: 4.6

## 2017-01-05 ENCOUNTER — Ambulatory Visit: Payer: Medicare Other | Admitting: Nurse Practitioner

## 2017-01-10 ENCOUNTER — Encounter: Payer: Self-pay | Admitting: Nurse Practitioner

## 2017-01-10 ENCOUNTER — Ambulatory Visit (INDEPENDENT_AMBULATORY_CARE_PROVIDER_SITE_OTHER): Payer: Medicare Other | Admitting: Nurse Practitioner

## 2017-01-10 VITALS — BP 160/90 | HR 63 | Temp 98.1°F | Ht 66.0 in | Wt 350.4 lb

## 2017-01-10 DIAGNOSIS — I10 Essential (primary) hypertension: Secondary | ICD-10-CM | POA: Diagnosis not present

## 2017-01-10 DIAGNOSIS — R0602 Shortness of breath: Secondary | ICD-10-CM | POA: Diagnosis not present

## 2017-01-10 DIAGNOSIS — Z952 Presence of prosthetic heart valve: Secondary | ICD-10-CM

## 2017-01-10 NOTE — Progress Notes (Addendum)
CARDIOLOGY OFFICE NOTE  Date:  01/10/2017    [name] Date of Birth: [birth year] Medical Record #[id number]  PCP:  [name], [name]  Cardiologist:  [name] (never formally seen but agreeable to co-managing with me)  Chief Complaint  Patient presents with  . Cardiac Valve Problem    Follow up visit - seen for Dr. Tamala Young    History of Present Illness: [name] is a 56 y.o. female who presents today for a follow up visit. She is a former patient of [name] - now basically follows with me - Dr. Tamala Young has agreed to co-manage with me.   She has a hx of valvular heart disease, idiopathic pulmonary hemosiderosis, ILD, polycythemia - requiring phlebotomies, CKD, HTN, HLD, & OSA. She underwent mechanical MVR and mechanical AVR with Dr. Evelina [facility] at [name] 07/2014. From [name]: "Cath and echo showed severe aortic stenosis, moderate mitral stenosis, PHBP, very small LV cavity and small aortic root with preserved ventricular function and no significant coronary disease. The patient was taken to the operating room by Dr. Evelina Young, on 08/06/2014 where she underwent Aortic valve replacement 77mm StJude Regent valve, mitral valve replacement 74mm St Jude mechanical valve finding rheumatic disease."      Post op she was noted to be in AFlutter. Rate was uncontrolled. Amiodarone dose was increased as well as the beta-blocker. She has had tolerance issues with amiodarone but was subsequently cardioverted and was able to have her amiodarone stopped in July of 2016 - noted that she would need different AAD if needed for recurrent arrhythmia. She underwent cardioversion on 4/21 - the dictated note says the post EKG was atrial flutter however the EKG post cardioversion showed NSR. We brought her back for an early post cardioversion visit to recheck/verify her EKG - she was in NSR. She was able to have her Midodrine stopped that she had been put on during her surgery.                 Back in November of 2016 - I saw her - she was short of breath - I got a CBC -  Horribly anemic - I sent her to GI for colonoscopy and she had to be transfused as well.   Last seen by me back last August of 2017 - she was doing ok. Rarely using oxygen at that time. Continued to see [name]. I last saw her in February and she was doing ok. BP running up and suspected this was due to excess salt - eating Roman noodles for almost every meal due to cost.   Echo updated back in July and reviewed with Dr. Tamala Young - her valves are noted to be undersized.    Comes here today. Here alone. Very hypoxic here today on arrival. Says she is "like a tick - ready to explode".  Weight is up 26 pounds since her visit with me and up 10 pounds since her recent discharge. Says she is not using that much salt - no more Roman noodles as she was doing before. Admitted last month with worsening SOB and LLE cellulitis - failed on outpatient therapy. Has had 4 rounds of antibiotics due to cellulitis - sounds like minimal improvement. Echo was repeated last month - not really any change compared to echo from March. No fever or chills - but taking Tylenol everyday. Worsening shortness of breath - she is fine as long as she is at rest but very  short of breath with any exertion. Her left lower leg still quite red and hot. Massive edema noted.   She is very upset - having to carpool with her sister - she has her sister's car - sister has to be picked up in [place] at [name] daughter has [name]'s car and they have to be at open house at 5:30. They live in [name].   Past Medical History:  Diagnosis Date  . Allergic rhinitis   . Anemia   . Anxiety   . Aortic stenosis   . Arthritis    "lower back" (11/30/2016)  . CAO (chronic airflow obstruction) (HCC)   . Cellulitis of left lower extremity 11/30/2016  . Depressive disorder   . Gout   . History of blood transfusion 03/2016   "I was anemic"  . HTN  (hypertension)   . Hyperlipidemia   . Hypertriglyceridemia   . Kidney disease    [name]  . Menopausal symptoms   . Mitral and aortic heart valve diseases, unspecified 07/2014   s/p AVR with #19 St Jude and s/p MVR with 79mm St. Jude per Dr. Evelina [facility] at [facility]  . Mitral stenosis   . Morbid obesity ([name])   . Noninfectious lymphedema   . On home oxygen therapy    "2-3L when I'm up doing a whole lot" (11/30/2016)  . Renal insufficiency   . Shortness of breath    with exertion  . Sleep apnea   . Undiagnosed cardiac murmurs   . Vitamin D deficiency     Past Surgical History:  Procedure Laterality Date  . AORTIC AND MITRAL VALVE REPLACEMENT  07/2014   s/p AVR with #19 St Jude and s/p MVR with 1mm St. Jude per Dr. [facility]velina [facility] at [facility]  . CARDIAC CATHETERIZATION  07/2014  . CARDIAC VALVE REPLACEMENT    . CARDIOVERSION N/A 09/19/2014   Procedure: CARDIOVERSION;  Surgeon: Diana Klein, MD;  Location: [name];  Service: Cardiovascular;  Laterality: N/A;  . [place]  . LUNG BIOPSY Left 10/03/2013   Procedure: Left Lung Biopsy;  Surgeon: Melrose Nakayama, MD;  Location: [place];  Service: Thoracic;  Laterality: Left;  . TONSILLECTOMY    . VIDEO ASSISTED THORACOSCOPY Left 10/03/2013   Procedure: Left Video Assited Thoracoscopy;  Surgeon: Melrose Nakayama, MD;  Location: [place];  Service: Thoracic;  Laterality: Left;  Marland Kitchen VIDEO BRONCHOSCOPY Bilateral 10/25/2012   Procedure: VIDEO BRONCHOSCOPY WITH FLUORO;  Surgeon: Kathee Delton, MD;  Location: [facility];  Service: Cardiopulmonary;  Laterality: Bilateral;     Medications: Current Meds  Medication Sig  . acetaminophen (TYLENOL) 650 MG CR tablet Take 1,300 mg by mouth 2 (two) times daily as needed for pain.   Marland Kitchen albuterol (PROVENTIL HFA;VENTOLIN HFA) 108 (90 Base) MCG/ACT inhaler Inhale 2 puffs into the lungs 2 (two) times daily as needed for wheezing or shortness of breath. Reported on 06/20/2015  . allopurinol (ZYLOPRIM)  300 MG tablet Take 300 mg by mouth daily.  Marland Kitchen aspirin EC 81 MG tablet Take 81 mg by mouth daily.  Marland Kitchen buPROPion (WELLBUTRIN XL) 300 MG 24 hr tablet Take 300 mg by mouth daily.   . cetirizine (ZYRTEC) 10 MG tablet Take 1 tablet (10 mg total) by mouth daily. (Patient taking differently: Take 10 mg by mouth at bedtime. )  . Cholecalciferol (VITAMIN D3) 2000 UNITS TABS Take 2,000 Units by mouth daily.   . citalopram (CELEXA) 20 MG tablet Take 20 mg by mouth  daily.   . furosemide (LASIX) 80 MG tablet Take 1 tablet (80 mg total) by mouth 2 (two) times daily.  . metoprolol (LOPRESSOR) 50 MG tablet Take 50 mg by mouth 2 (two) times daily.  . montelukast (SINGULAIR) 10 MG tablet Take 10 mg by mouth daily.   . OXYGEN Inhale 2.5 L into the lungs continuous. 3 L with exertion  . pravastatin (PRAVACHOL) 80 MG tablet Take 80 mg by mouth daily.   Marland Kitchen triamcinolone (NASACORT AQ) 55 MCG/ACT AERO nasal inhaler Place 2 sprays into the nose daily. (Patient taking differently: Place 2 sprays into the nose daily as needed (for seasonal allergies). )  . warfarin (COUMADIN) 7.5 MG tablet TAKE 1 TABLET BY MOUTH DAILY OR AS DIRECTED BY COUMADIN CLINIC (Patient taking differently: Take 3.75-7.5 mg by mouth See admin instructions. 7.5 mg at bedtime on Sun/Tues/Thurs/Sat and 3.75 mg on Mon/Wed/Fri)     Allergies: No Known Allergies  Social History: The patient  reports that she quit smoking about 3 years ago. Her smoking use included Cigarettes. She has a 35.00 pack-year smoking history. She has never used smokeless tobacco. She reports that she does not drink alcohol or use drugs.   Family History: The patient's family history includes Cancer in her mother; Dementia in her mother; Diabetes in her brother; Emphysema in her mother; Heart disease in her father; Hypertension in her brother, father, mother, and sister; Kidney disease in her father; Kidney failure in her father; Stroke in her brother.   Review of Systems: Please  see the history of present illness.   Otherwise, the review of systems is positive for none.   All other systems are reviewed and negative.   Physical Exam: VS:  BP (!) 160/90 (BP Location: Right Arm, Patient Position: Sitting, Cuff Size: Large)   Pulse 63   Temp 98.1 F (36.7 C)   Ht 5\' 6"  (1.676 m)   Wt (!) 350 lb 6.4 oz (158.9 kg)   SpO2 (!) 89% Comment: at rest with 3 liters of o2  BMI 56.56 kg/m  .  BMI Body mass index is 56.56 kg/m.  Wt Readings from Last 3 Encounters:  01/10/17 (!) 350 lb 6.4 oz (158.9 kg)  12/01/16 (!) 334 lb 3.2 oz (151.6 kg)  07/27/16 (!) 324 lb 1.9 oz (147 kg)    General: Morbidly obese. She is a little depressed. She is alert and in no acute distress. Her weight is up 16 pounds in 5 weeks and up 26 pounds since last visit with me.   HEENT: Normal but with poor dentition and missing teeth. Neck quite thick but probable JVD noted.  Neck: No masses noted.  Cardiac: Regular rate and rhythm. Heart sounds are distant. Massive edema - has chronic/significant lymphedema on the right. Left leg red and hot with marked edema as well up past the thighs.  Respiratory:  Lungs are fairly clear to auscultation bilaterally but with increased work of breathing. Her sats were down in the low 70's on arrival to the office - slowly increased to 84 to 85% with deep breathing and continued oxygen. Short of breath with walking into the office.  GI: Soft and nontender.  MS: No deformity or atrophy. Gait and ROM intact.  Skin: Warm and dry. Color is normal.  Neuro:  Strength and sensation are intact and no gross focal deficits noted.  Psych: Alert, appropriate and with normal affect.   LABORATORY DATA:  EKG:  EKG is ordered today. This shows NSR  with long 1st degree AV block - reviewed with Dr. Tamala Young here in the office.   Lab Results  Component Value Date   WBC 4.4 12/03/2016   HGB 15.8 (H) 12/03/2016   HCT 52.2 (H) 12/03/2016   PLT 176 12/03/2016   GLUCOSE 80 12/01/2016     CHOL 158 12/10/2014   TRIG 132.0 12/10/2014   HDL 49.30 12/10/2014   LDLCALC 82 12/10/2014   ALT 20 11/26/2016   AST 29 11/26/2016   NA 137 12/01/2016   K 4.5 12/01/2016   CL 99 (L) 12/01/2016   CREATININE 1.43 (H) 12/01/2016   BUN 25 (H) 12/01/2016   CO2 28 12/01/2016   TSH 3.57 09/18/2014   INR 4.6 01/03/2017   Lab Results  Component Value Date   INR 4.6 01/03/2017   INR 3.7 12/15/2016   INR 3.3 12/07/2016     BNP (last 3 results)  Recent Labs  11/30/16 1326  BNP 221.3*    ProBNP (last 3 results) No results for input(s): PROBNP in the last 8760 hours.   Other Studies Reviewed Today:  Echo Study Conclusions 11/2016  - Left ventricle: The cavity size was normal. Wall thickness was   increased in a pattern of moderate LVH. Systolic function was   vigorous. The estimated ejection fraction was in the range of 65%   to 70%. - Aortic valve: AV prosthesis is well seated Peak and mean   gradients through the valve atr 47 and 25 mm Hg respectively   These gradients are lower than those reported in echo of March   2018 - Mitral valve: MV prosthesis is diffiuclt to see well Peak and   mean gradients through the valve are 18 and 8 mm Hg respectively.   MVA by P T1/2 is 1.8 cm2 No significant MR. No significant change   in gradients from echo in March 2018. Valve area by pressure   half-time: 1.8 cm^2. - Pulmonary arteries: PA peak pressure: 43 mm Hg (S).   Assessment/Plan:  1. Acute on chronic diastolic HF - most likely aggravated by ILD/chronic respiratory failure/infection - needs readmission for IV diuresis - weight is up 26 pounds since February. Noted transportation difficulties - she has the only car available for her family who live in [place]. She is agreeable to being admitted in the AM for IV diuresis.  Seen with Dr. Tamala Young this afternoon who is in agreement with this plan. Will have her take a total of 120 mg po of Lasix this pm. Labs today.   2. Atrial  flutter, unspecified She remains in NSR on exam and by EKG. Long 1st degree AV block noted. She remains off of amiodarone. Remains on coumadin as well.   3. Valvular heart disease S/P AVR and S/P MVR (mitral valve repair) Continue coumadin. Continue SBE prophylaxis. I do worry about the size of her prosthetic aortic valves in relationship to her. Most recent echo noted and reviewed with Dr. Tamala Young - continue with medical management. Long term prognosis tenuous.   4. Essential hypertension BP not at goal - will need following during her admission with med titration as needed. Will need to keep in mind cost of additional medicines.   5. Hyperlipidemia - on statin therapy  6. Renal Insufficiency - will need to be followed.   7. Chronic shortness of breath/hypoxia/ILD- sats slowly up to 84%. Still short of breath. See above. She could not get an appointment with pulmonary until later this month - may need  their input this admission.   8. Morbid obesity - This remains her most pressing issue. Her diet is poor due to poor social situation. Unfortunately, I do not see this changing. Not able to exercise due to poor lung function/hypoxia.   9. Cellulitis - will need antibiotics resumed - will add back Doxycycline.   10. ILD - may need pulmonary to see while hospitalized.   11. Chronic anticoagulation - INR today.   Current medicines are reviewed with the patient today.  The patient does not have concerns regarding medicines other than what has been noted above.  The following changes have been made:  See above.  Labs/ tests ordered today include:    Orders Placed This Encounter  Procedures  . Basic metabolic panel  . CBC  . Hepatic function panel  . Protime-INR  . Pro b natriuretic peptide (BNP)  . EKG 12-Lead     Disposition:   FU with me in 6 months.   Patient is agreeable to this plan and will call if any problems develop in the interim.   SignedTruitt Merle, NP    01/10/2017 3:15 PM  [facility] [address] [place], [place]  [address] Phone: [phone] Fax: [phone]

## 2017-01-10 NOTE — Patient Instructions (Addendum)
We will be checking the following labs today - INR, BMET, CBC, HPF and BNP   Medication Instructions:    Continue with your current medicines. BUT  Take 120 mg of Lasix tonight.     Testing/Procedures To Be Arranged:  N/A  Follow-Up:   Will plan on elective admission tomorrow - be ready to go when [facility].     Other Special Instructions:   N/A    If you need a refill on your cardiac medications before your next appointment, please call your pharmacy.   Call the [place] office at [phone] if you have any questions, problems or concerns.

## 2017-01-10 NOTE — Addendum Note (Signed)
Addended by: [name] on: 01/10/2017 09:14 PM   Modules accepted: Orders, SmartSet

## 2017-01-11 ENCOUNTER — Encounter (HOSPITAL_COMMUNITY): Payer: Self-pay | Admitting: *Deleted

## 2017-01-11 ENCOUNTER — Inpatient Hospital Stay (HOSPITAL_COMMUNITY)
Admission: RE | Admit: 2017-01-11 | Discharge: 2017-01-13 | DRG: 291 | Disposition: A | Discharge status: Home / Self Care | Payer: Medicare Other | Source: Ambulatory Visit | Attending: Interventional Cardiology | Admitting: Interventional Cardiology

## 2017-01-11 DIAGNOSIS — I272 Pulmonary hypertension, unspecified: Secondary | ICD-10-CM | POA: Diagnosis present

## 2017-01-11 DIAGNOSIS — Z7901 Long term (current) use of anticoagulants: Secondary | ICD-10-CM | POA: Diagnosis not present

## 2017-01-11 DIAGNOSIS — E785 Hyperlipidemia, unspecified: Secondary | ICD-10-CM | POA: Diagnosis present

## 2017-01-11 DIAGNOSIS — I10 Essential (primary) hypertension: Secondary | ICD-10-CM | POA: Diagnosis not present

## 2017-01-11 DIAGNOSIS — I5031 Acute diastolic (congestive) heart failure: Secondary | ICD-10-CM | POA: Diagnosis not present

## 2017-01-11 DIAGNOSIS — J9611 Chronic respiratory failure with hypoxia: Secondary | ICD-10-CM | POA: Diagnosis present

## 2017-01-11 DIAGNOSIS — Z823 Family history of stroke: Secondary | ICD-10-CM | POA: Diagnosis not present

## 2017-01-11 DIAGNOSIS — I5033 Acute on chronic diastolic (congestive) heart failure: Secondary | ICD-10-CM | POA: Diagnosis present

## 2017-01-11 DIAGNOSIS — L03116 Cellulitis of left lower limb: Secondary | ICD-10-CM | POA: Diagnosis present

## 2017-01-11 DIAGNOSIS — Z6841 Body Mass Index (BMI) 40.0 and over, adult: Secondary | ICD-10-CM

## 2017-01-11 DIAGNOSIS — J849 Interstitial pulmonary disease, unspecified: Secondary | ICD-10-CM | POA: Diagnosis present

## 2017-01-11 DIAGNOSIS — Z9981 Dependence on supplemental oxygen: Secondary | ICD-10-CM

## 2017-01-11 DIAGNOSIS — Z825 Family history of asthma and other chronic lower respiratory diseases: Secondary | ICD-10-CM

## 2017-01-11 DIAGNOSIS — R0600 Dyspnea, unspecified: Secondary | ICD-10-CM

## 2017-01-11 DIAGNOSIS — Z833 Family history of diabetes mellitus: Secondary | ICD-10-CM

## 2017-01-11 DIAGNOSIS — Z809 Family history of malignant neoplasm, unspecified: Secondary | ICD-10-CM | POA: Diagnosis not present

## 2017-01-11 DIAGNOSIS — I440 Atrioventricular block, first degree: Secondary | ICD-10-CM | POA: Diagnosis present

## 2017-01-11 DIAGNOSIS — G4733 Obstructive sleep apnea (adult) (pediatric): Secondary | ICD-10-CM | POA: Diagnosis present

## 2017-01-11 DIAGNOSIS — I130 Hypertensive heart and chronic kidney disease with heart failure and stage 1 through stage 4 chronic kidney disease, or unspecified chronic kidney disease: Principal | ICD-10-CM | POA: Diagnosis present

## 2017-01-11 DIAGNOSIS — D751 Secondary polycythemia: Secondary | ICD-10-CM | POA: Diagnosis present

## 2017-01-11 DIAGNOSIS — Z5181 Encounter for therapeutic drug level monitoring: Secondary | ICD-10-CM | POA: Diagnosis not present

## 2017-01-11 DIAGNOSIS — I5032 Chronic diastolic (congestive) heart failure: Secondary | ICD-10-CM | POA: Diagnosis present

## 2017-01-11 DIAGNOSIS — N189 Chronic kidney disease, unspecified: Secondary | ICD-10-CM | POA: Diagnosis present

## 2017-01-11 DIAGNOSIS — N183 Chronic kidney disease, stage 3 (moderate): Secondary | ICD-10-CM | POA: Diagnosis not present

## 2017-01-11 DIAGNOSIS — Z952 Presence of prosthetic heart valve: Secondary | ICD-10-CM | POA: Diagnosis not present

## 2017-01-11 DIAGNOSIS — R0602 Shortness of breath: Secondary | ICD-10-CM

## 2017-01-11 DIAGNOSIS — Z87891 Personal history of nicotine dependence: Secondary | ICD-10-CM | POA: Diagnosis not present

## 2017-01-11 DIAGNOSIS — Z9889 Other specified postprocedural states: Secondary | ICD-10-CM | POA: Diagnosis not present

## 2017-01-11 DIAGNOSIS — Z8249 Family history of ischemic heart disease and other diseases of the circulatory system: Secondary | ICD-10-CM

## 2017-01-11 DIAGNOSIS — I5043 Acute on chronic combined systolic (congestive) and diastolic (congestive) heart failure: Secondary | ICD-10-CM | POA: Diagnosis not present

## 2017-01-11 DIAGNOSIS — N171 Acute kidney failure with acute cortical necrosis: Secondary | ICD-10-CM | POA: Diagnosis not present

## 2017-01-11 DIAGNOSIS — Z79899 Other long term (current) drug therapy: Secondary | ICD-10-CM | POA: Diagnosis not present

## 2017-01-11 DIAGNOSIS — I270 Primary pulmonary hypertension: Secondary | ICD-10-CM

## 2017-01-11 DIAGNOSIS — I38 Endocarditis, valve unspecified: Secondary | ICD-10-CM

## 2017-01-11 LAB — BASIC METABOLIC PANEL
BUN/Creatinine Ratio: 18 (ref 9–23)
BUN: 24 mg/dL (ref 6–24)
CO2: 30 mmol/L — ABNORMAL HIGH (ref 20–29)
Calcium: 9.4 mg/dL (ref 8.7–10.2)
Chloride: 97 mmol/L (ref 96–106)
Creatinine, Ser: 1.31 mg/dL — ABNORMAL HIGH (ref 0.57–1.00)
GFR calc Af Amer: 52 mL/min/{1.73_m2} — ABNORMAL LOW (ref >59)
GFR calc non Af Amer: 45 mL/min/{1.73_m2} — ABNORMAL LOW (ref >59)
Glucose: 74 mg/dL (ref 65–99)
Potassium: 4.7 mmol/L (ref 3.5–5.2)
Sodium: 140 mmol/L (ref 134–144)

## 2017-01-11 LAB — HEPATIC FUNCTION PANEL
ALT: 17 IU/L (ref 0–32)
AST: 24 IU/L (ref 0–40)
Albumin: 4.4 g/dL (ref 3.5–5.5)
Alkaline Phosphatase: 74 IU/L (ref 39–117)
Bilirubin Total: 0.7 mg/dL (ref 0.0–1.2)
Bilirubin, Direct: 0.22 mg/dL (ref 0.00–0.40)
Total Protein: 7.6 g/dL (ref 6.0–8.5)

## 2017-01-11 LAB — CBC
Hematocrit: 47.3 % — ABNORMAL HIGH (ref 34.0–46.6)
Hemoglobin: 14.9 g/dL (ref 11.1–15.9)
MCH: 29.9 pg (ref 26.6–33.0)
MCHC: 31.5 g/dL (ref 31.5–35.7)
MCV: 95 fL (ref 79–97)
Platelets: 194 10*3/uL (ref 150–379)
RBC: 4.99 x10E6/uL (ref 3.77–5.28)
RDW: 17.7 % — ABNORMAL HIGH (ref 12.3–15.4)
WBC: 9.8 10*3/uL (ref 3.4–10.8)

## 2017-01-11 LAB — PROTIME-INR
INR: 3.7 — ABNORMAL HIGH (ref 0.8–1.2)
Prothrombin Time: 36.1 s — ABNORMAL HIGH (ref 9.1–12.0)

## 2017-01-11 LAB — PRO B NATRIURETIC PEPTIDE: NT-Pro BNP: 1062 pg/mL — ABNORMAL HIGH (ref 0–287)

## 2017-01-11 MED ORDER — PRAVASTATIN SODIUM 40 MG PO TABS
80.0000 mg | ORAL_TABLET | Freq: Every day | ORAL | Status: DC
  Administered 2017-01-11 – 2017-01-12 (×2): 80 mg via ORAL
  Filled 2017-01-11 (×3): qty 2

## 2017-01-11 MED ORDER — SODIUM CHLORIDE 0.9% FLUSH: 3.0000 mL | INTRAVENOUS | Status: DC | PRN

## 2017-01-11 MED ORDER — VITAMIN D 1000 UNITS PO TABS
2000.0000 [IU] | ORAL_TABLET | Freq: Every day | ORAL | Status: DC
  Administered 2017-01-12 – 2017-01-13 (×2): 2000 [IU] via ORAL
  Filled 2017-01-11 (×3): qty 2

## 2017-01-11 MED ORDER — ALBUTEROL SULFATE (2.5 MG/3ML) 0.083% IN NEBU: 2.5000 mg | INHALATION_SOLUTION | RESPIRATORY_TRACT | Status: DC | PRN

## 2017-01-11 MED ORDER — ALBUTEROL SULFATE HFA 108 (90 BASE) MCG/ACT IN AERS: 2.0000 | INHALATION_SPRAY | Freq: Two times a day (BID) | RESPIRATORY_TRACT | Status: DC | PRN

## 2017-01-11 MED ORDER — METOPROLOL TARTRATE 50 MG PO TABS
50.0000 mg | ORAL_TABLET | Freq: Two times a day (BID) | ORAL | Status: DC
  Administered 2017-01-11 – 2017-01-13 (×3): 50 mg via ORAL
  Filled 2017-01-11 (×4): qty 1

## 2017-01-11 MED ORDER — SODIUM CHLORIDE 0.9% FLUSH
3.0000 mL | Freq: Two times a day (BID) | INTRAVENOUS | Status: DC
  Administered 2017-01-11 – 2017-01-13 (×4): 3 mL via INTRAVENOUS

## 2017-01-11 MED ORDER — ONDANSETRON HCL 4 MG/2ML IJ SOLN: 4.0000 mg | Freq: Four times a day (QID) | INTRAMUSCULAR | Status: DC | PRN

## 2017-01-11 MED ORDER — LORATADINE 10 MG PO TABS
10.0000 mg | ORAL_TABLET | Freq: Every day | ORAL | Status: DC
Start: 2017-01-11 — End: 2017-01-13
  Administered 2017-01-11 – 2017-01-13 (×3): 10 mg via ORAL
  Filled 2017-01-11 (×4): qty 1

## 2017-01-11 MED ORDER — ALLOPURINOL 300 MG PO TABS
300.0000 mg | ORAL_TABLET | Freq: Every day | ORAL | Status: DC
  Administered 2017-01-11 – 2017-01-13 (×3): 300 mg via ORAL
  Filled 2017-01-11 (×4): qty 1

## 2017-01-11 MED ORDER — SODIUM CHLORIDE 0.9 % IV SOLN: 250.0000 mL | INTRAVENOUS | Status: DC | PRN

## 2017-01-11 MED ORDER — BUPROPION HCL ER (XL) 300 MG PO TB24
300.0000 mg | ORAL_TABLET | Freq: Every day | ORAL | Status: DC
  Administered 2017-01-12 – 2017-01-13 (×2): 300 mg via ORAL
  Filled 2017-01-11 (×3): qty 1

## 2017-01-11 MED ORDER — LOSARTAN POTASSIUM 50 MG PO TABS
50.0000 mg | ORAL_TABLET | Freq: Every day | ORAL | Status: DC
  Administered 2017-01-13: 50 mg via ORAL
  Filled 2017-01-11 (×2): qty 1

## 2017-01-11 MED ORDER — FUROSEMIDE 10 MG/ML IJ SOLN
80.0000 mg | Freq: Three times a day (TID) | INTRAMUSCULAR | Status: DC
  Administered 2017-01-11 – 2017-01-12 (×3): 80 mg via INTRAVENOUS
  Filled 2017-01-11 (×3): qty 8

## 2017-01-11 MED ORDER — ASPIRIN EC 81 MG PO TBEC
81.0000 mg | DELAYED_RELEASE_TABLET | Freq: Every day | ORAL | Status: DC
  Administered 2017-01-12 – 2017-01-13 (×2): 81 mg via ORAL
  Filled 2017-01-11 (×3): qty 1

## 2017-01-11 MED ORDER — DOXYCYCLINE HYCLATE 100 MG PO TABS
100.0000 mg | ORAL_TABLET | Freq: Two times a day (BID) | ORAL | Status: DC
  Administered 2017-01-11 – 2017-01-13 (×5): 100 mg via ORAL
  Filled 2017-01-11 (×5): qty 1

## 2017-01-11 MED ORDER — WARFARIN - PHARMACIST DOSING INPATIENT: Freq: Every day | Status: DC

## 2017-01-11 MED ORDER — ACETAMINOPHEN 325 MG PO TABS
650.0000 mg | ORAL_TABLET | ORAL | Status: DC | PRN
  Administered 2017-01-12 (×2): 650 mg via ORAL
  Filled 2017-01-11 (×2): qty 2

## 2017-01-11 MED ORDER — MONTELUKAST SODIUM 10 MG PO TABS
10.0000 mg | ORAL_TABLET | Freq: Every day | ORAL | Status: DC
  Administered 2017-01-12: 10 mg via ORAL
  Filled 2017-01-11 (×2): qty 1

## 2017-01-11 MED ORDER — CITALOPRAM HYDROBROMIDE 20 MG PO TABS
20.0000 mg | ORAL_TABLET | Freq: Every day | ORAL | Status: DC
  Administered 2017-01-12 – 2017-01-13 (×2): 20 mg via ORAL
  Filled 2017-01-11 (×3): qty 1

## 2017-01-11 MED ORDER — TRIAMCINOLONE ACETONIDE 55 MCG/ACT NA AERO
2.0000 | INHALATION_SPRAY | Freq: Every day | NASAL | Status: DC
  Administered 2017-01-13: 2 via NASAL
  Filled 2017-01-11: qty 21.6

## 2017-01-11 NOTE — H&P (Signed)
Office Visit   01/10/2017 [name]  Diana Junes, NP  Cardiology   S/P AVR +3 more  Dx   Cardiac Valve Problem ; Referred by [name], [place]  Reason for Visit   Additional Documentation   Vitals:   BP  160/90 (BP Location: Right Arm, Patient Position: Sitting, Cuff Size: Large)   Pulse 63   Temp 98.1 F (36.7 C)   Ht 5\' 6"  (1.676 m)   Wt  350 lb 6.4 oz (158.9 kg)   SpO2  89%    BMI 56.56 kg/m   BSA 2.72 m      More Vitals   Flowsheets:   Custom Formula Data,   Anthropometrics,   MEWS Score     Encounter Info:   Billing Info,   History,   Allergies,   Detailed Report     All Notes   Addendum Note by Diana Junes, NP at 01/10/2017 2:30 PM   Author: [name], NP Author Type: Nurse Practitioner Filed: 01/10/2017 9:14 PM  Note Status: Signed Cosign: Cosign Not Required Encounter Date: 01/10/2017  Editor: Diana Junes, NP (Nurse Practitioner)    Addended by: [name] on: 01/10/2017 09:14 PM   Modules accepted: Orders, SmartSet     Progress Notes by Diana Junes, NP at 01/10/2017 2:30 PM   Author: [name], NP Author Type: Nurse Practitioner Filed: 01/10/2017 9:14 PM  Note Status: Addendum Cosign: Cosign Not Required Encounter Date: 01/10/2017  Editor: Diana Junes, NP (Nurse Practitioner)  Prior Versions: 1. Diana Junes, NP (Nurse Practitioner) at 01/10/2017 3:17 PM - Signed  Expand All Collapse All     The patient has been seen in conjunction with Diana Merle, [name]. All aspects of care have been considered and discussed. The patient has been personally interviewed, examined, and all clinical data has been reviewed.    The patient was seen as part of DOD responsibility at [name] on 01/10/2017.  Morbidly obese female with mechanical aortic and mitral valve, pulmonary hypertension, obstructive sleep apnea, polycythemia (requiring phlebotomy), interstitial lung disease, who  presents with progressive weight gain (25 pounds since February of which 16 has occurred over the past 6 weeks) and swelling over the past 2 weeks. Now with significant dyspnea on exertion and hypoxemia.  Plan is to admit to a telemetry bed, institute IV diuresis, and optimize respiratory status relative to interstitial lung disease and obstructive sleep apnea. Oxygen therapy will likely need to be continuous.  May need to consider an advanced heart failure team consultation.     CARDIOLOGY HISTORY AND PHYSICAL  Date:  01/10/2017    [name] Date of Birth: 12-10-1960 Medical Record #[id number]  PCP:  [name], [name]            Cardiologist:  [name] (never formally seen but agreeable to co-managing with me)      Chief Complaint  Patient presents with  . Cardiac Valve Problem    Follow up visit - seen for [name]    History of Present Illness: [name] is a 56 y.o. female who presents today for a follow up visit. She is a former patient of [name] - now basically follows with me - [name] has agreed to co-manage with me.   She has a hx of valvular heart disease, idiopathic pulmonary hemosiderosis, ILD, polycythemia - requiring phlebotomies, CKD, HTN, HLD, & OSA. She underwent mechanical  MVR and mechanical AVR with [name] at [name] 07/2014. From [name]: "Cath and echo showed severe aortic stenosis, moderate mitral stenosis, PHBP, very small LV cavity and small aortic root with preserved ventricular function and no significant coronary disease. The patient was taken to the operating room by [name], on 08/06/2014 where she underwent Aortic valve replacement 65mm StJude Regent valve, mitral valve replacement 90mm St Jude mechanical valve finding rheumatic disease."      Post op she was noted to be in AFlutter. Rate was uncontrolled. Amiodarone dose was increased as well as the beta-blocker. She has had tolerance issues  with amiodarone but was subsequently cardioverted and was able to have her amiodarone stopped in July of 2016 - noted that she would need different AAD if needed for recurrent arrhythmia. She underwent cardioversion on 4/21 - the dictated note says the post EKG was atrial flutter however the EKG post cardioversion showed NSR. We brought her back for an early post cardioversion visit to recheck/verify her EKG - she was in NSR. She was able to have her Midodrine stopped that she had been put on during her surgery.               Back in November of 2016 - I saw her - she was short of breath - I got a CBC - Horribly anemic - I sent her to GI for colonoscopy and she had to be transfused as well.   Last seen by me back last August of 2017 - she was doing ok. Rarely using oxygen at that time. Continued to see [name]. I last saw her in February and she was doing ok. BP running up and suspected this was due to excess salt - eating Roman noodles for almost every meal due to cost.   Echo updated back in July and reviewed with [name] - her valves are noted to be undersized.    Comes here today. Here alone. Very hypoxic here today on arrival. Says she is "like a tick - ready to explode".  Weight is up 26 pounds since her visit with me and up 10 pounds since her recent discharge. Says she is not using that much salt - no more Roman noodles as she was doing before. Admitted last month with worsening SOB and LLE cellulitis - failed on outpatient therapy. Has had 4 rounds of antibiotics due to cellulitis - sounds like minimal improvement. Echo was repeated last month - not really any change compared to echo from March. No fever or chills - but taking Tylenol everyday. Worsening shortness of breath - she is fine as long as she is at rest but very short of breath with any exertion. Her left lower leg still quite red and hot. Massive edema noted.   She is very upset - having to carpool with her sister -  she has her sister's car - sister has to be picked up in [name] at [name] daughter has [name]'s car and they have to be at open house at 5:30. They live in [name].       Past Medical History:  Diagnosis Date  . Allergic rhinitis   . Anemia   . Anxiety   . Aortic stenosis   . Arthritis    "lower back" (11/30/2016)  . CAO (chronic airflow obstruction) (HCC)   . Cellulitis of left lower extremity 11/30/2016  . Depressive disorder   . Gout   . History of blood transfusion 03/2016   "  I was anemic"  . HTN (hypertension)   . Hyperlipidemia   . Hypertriglyceridemia   . Kidney disease    Dr. Graylon Gunning  . Menopausal symptoms   . Mitral and aortic heart valve diseases, unspecified 07/2014   s/p AVR with #19 St Jude and s/p MVR with 40mm St. Jude per [name] at [facility]  . Mitral stenosis   . Morbid obesity ([place])   . Noninfectious lymphedema   . On home oxygen therapy    "2-3L when I'm up doing a whole lot" (11/30/2016)  . Renal insufficiency   . Shortness of breath    with exertion  . Sleep apnea   . Undiagnosed cardiac murmurs   . Vitamin D deficiency          Past Surgical History:  Procedure Laterality Date  . AORTIC AND MITRAL VALVE REPLACEMENT  07/2014   s/p AVR with #19 St Jude and s/p MVR with 44mm St. Jude per [name] at [facility]  . CARDIAC CATHETERIZATION  07/2014  . CARDIAC VALVE REPLACEMENT    . CARDIOVERSION N/A 09/19/2014   Procedure: CARDIOVERSION;  Surgeon: Sanda Klein, MD;  Location: [facility];  Service: Cardiovascular;  Laterality: N/A;  . [place]  . LUNG BIOPSY Left 10/03/2013   Procedure: Left Lung Biopsy;  Surgeon: Melrose Nakayama, MD;  Location: [facility];  Service: Thoracic;  Laterality: Left;  . TONSILLECTOMY    . VIDEO ASSISTED THORACOSCOPY Left 10/03/2013   Procedure: Left Video Assited Thoracoscopy;  Surgeon: Melrose Nakayama, MD;  Location: [facility];  Service: Thoracic;  Laterality: Left;  Marland Kitchen  VIDEO BRONCHOSCOPY Bilateral 10/25/2012   Procedure: VIDEO BRONCHOSCOPY WITH FLUORO;  Surgeon: Kathee Delton, MD;  Location: [facility];  Service: Cardiopulmonary;  Laterality: Bilateral;     Medications: ActiveMedications      Current Meds  Medication Sig  . acetaminophen (TYLENOL) 650 MG CR tablet Take 1,300 mg by mouth 2 (two) times daily as needed for pain.   Marland Kitchen albuterol (PROVENTIL HFA;VENTOLIN HFA) 108 (90 Base) MCG/ACT inhaler Inhale 2 puffs into the lungs 2 (two) times daily as needed for wheezing or shortness of breath. Reported on 06/20/2015  . allopurinol (ZYLOPRIM) 300 MG tablet Take 300 mg by mouth daily.  Marland Kitchen aspirin EC 81 MG tablet Take 81 mg by mouth daily.  Marland Kitchen buPROPion (WELLBUTRIN XL) 300 MG 24 hr tablet Take 300 mg by mouth daily.   . cetirizine (ZYRTEC) 10 MG tablet Take 1 tablet (10 mg total) by mouth daily. (Patient taking differently: Take 10 mg by mouth at bedtime. )  . Cholecalciferol (VITAMIN D3) 2000 UNITS TABS Take 2,000 Units by mouth daily.   . citalopram (CELEXA) 20 MG tablet Take 20 mg by mouth daily.   . furosemide (LASIX) 80 MG tablet Take 1 tablet (80 mg total) by mouth 2 (two) times daily.  . metoprolol (LOPRESSOR) 50 MG tablet Take 50 mg by mouth 2 (two) times daily.  . montelukast (SINGULAIR) 10 MG tablet Take 10 mg by mouth daily.   . OXYGEN Inhale 2.5 L into the lungs continuous. 3 L with exertion  . pravastatin (PRAVACHOL) 80 MG tablet Take 80 mg by mouth daily.   Marland Kitchen triamcinolone (NASACORT AQ) 55 MCG/ACT AERO nasal inhaler Place 2 sprays into the nose daily. (Patient taking differently: Place 2 sprays into the nose daily as needed (for seasonal allergies). )  . warfarin (COUMADIN) 7.5 MG tablet TAKE 1 TABLET BY MOUTH DAILY OR  AS DIRECTED BY COUMADIN CLINIC (Patient taking differently: Take 3.75-7.5 mg by mouth See admin instructions. 7.5 mg at bedtime on Sun/Tues/Thurs/Sat and 3.75 mg on Mon/Wed/Fri)       Allergies: No Known  Allergies  Social History: The patient  reports that she quit smoking about 3 years ago. Her smoking use included Cigarettes. She has a 35.00 pack-year smoking history. She has never used smokeless tobacco. She reports that she does not drink alcohol or use drugs.   Family History: The patient's family history includes Cancer in her mother; Dementia in her mother; Diabetes in her brother; Emphysema in her mother; Heart disease in her father; Hypertension in her brother, father, mother, and sister; Kidney disease in her father; Kidney failure in her father; Stroke in her brother.   Review of Systems: Please see the history of present illness.   Otherwise, the review of systems is positive for none.   All other systems are reviewed and negative.   Physical Exam: VS:  BP (!) 160/90 (BP Location: Right Arm, Patient Position: Sitting, Cuff Size: Large)   Pulse 63   Temp 98.1 F (36.7 C)   Ht 5\' 6"  (1.676 m)   Wt (!) 350 lb 6.4 oz (158.9 kg)   SpO2 (!) 89% Comment: at rest with 3 liters of o2  BMI 56.56 kg/m  .  BMI Body mass index is 56.56 kg/m.     Wt Readings from Last 3 Encounters:  01/10/17 (!) 350 lb 6.4 oz (158.9 kg)  12/01/16 (!) 334 lb 3.2 oz (151.6 kg)  07/27/16 (!) 324 lb 1.9 oz (147 kg)    General: Morbidly obese. She is a little depressed. She is alert and in no acute distress. Her weight is up 16 pounds in 5 weeks and up 26 pounds since last visit with me.   HEENT: Normal but with poor dentition and missing teeth. Neck quite thick but probable JVD noted.  Neck: No masses noted.  Cardiac: Regular rate and rhythm. Heart sounds are distant. Massive edema - has chronic/significant lymphedema on the right. Left leg red and hot with marked edema as well up past the thighs.  Respiratory:  Lungs are fairly clear to auscultation bilaterally but with increased work of breathing. Her sats were down in the low 70's on arrival to the office - slowly increased to 84 to 85% with deep  breathing and continued oxygen. Short of breath with walking into the office.  GI: Soft and nontender.  MS: No deformity or atrophy. Gait and ROM intact.  Skin: Warm and dry. Color is normal.  Neuro:  Strength and sensation are intact and no gross focal deficits noted.  Psych: Alert, appropriate and with normal affect.   LABORATORY DATA:  EKG:  EKG is ordered today. This shows NSR with long 1st degree AV block - reviewed with [name] here in the office.   RecentLabs       Lab Results  Component Value Date   WBC 4.4 12/03/2016   HGB 15.8 (H) 12/03/2016   HCT 52.2 (H) 12/03/2016   PLT 176 12/03/2016   GLUCOSE 80 12/01/2016   CHOL 158 12/10/2014   TRIG 132.0 12/10/2014   HDL 49.30 12/10/2014   LDLCALC 82 12/10/2014   ALT 20 11/26/2016   AST 29 11/26/2016   NA 137 12/01/2016   K 4.5 12/01/2016   CL 99 (L) 12/01/2016   CREATININE 1.43 (H) 12/01/2016   BUN 25 (H) 12/01/2016   CO2 28 12/01/2016  TSH 3.57 09/18/2014   INR 4.6 01/03/2017     RecentLabs       Lab Results  Component Value Date   INR 4.6 01/03/2017   INR 3.7 12/15/2016   INR 3.3 12/07/2016       BNP (last 3 results)  RecentLabs(withinlast365days)   Recent Labs  11/30/16 1326  BNP 221.3*      ProBNP (last 3 results) RecentLabs(withinlast365days)  No results for input(s): PROBNP in the last 8760 hours.     Other Studies Reviewed Today:  Echo Study Conclusions 11/2016  - Left ventricle: The cavity size was normal. Wall thickness was increased in a pattern of moderate LVH. Systolic function was vigorous. The estimated ejection fraction was in the range of 65% to 70%. - Aortic valve: AV prosthesis is well seated Peak and mean gradients through the valve atr 47 and 25 mm Hg respectively These gradients are lower than those reported in echo of March 2018 - Mitral valve: MV prosthesis is diffiuclt to see well Peak and mean  gradients through the valve are 18 and 8 mm Hg respectively. MVA by P T1/2 is 1.8 cm2 No significant MR. No significant change in gradients from echo in March 2018. Valve area by pressure half-time: 1.8 cm^2. - Pulmonary arteries: PA peak pressure: 43 mm Hg (S).   Assessment/Plan:  1. Acute on chronic diastolic HF - most likely aggravated by ILD/chronic respiratory failure/infection - needs readmission for IV diuresis - weight is up 26 pounds since February. Noted transportation difficulties - she has the only car available for her family who live in [place]. She is agreeable to being admitted in the AM for IV diuresis.  Seen with [name] this afternoon who is in agreement with this plan. Will have her take a total of 120 mg po of Lasix this pm. Labs today.   2. Atrial flutter, unspecified She remains in NSR on exam and by EKG. Long 1st degree AV block noted. She remains off of amiodarone. Remains on coumadin as well.   3. Valvular heart disease S/P AVR and S/P MVR (mitral valve repair) Continue coumadin. Continue SBE prophylaxis. I do worry about the size of her prosthetic aortic valves in relationship to her. Most recent echo noted and reviewed with [name] - continue with medical management. Long term prognosis tenuous.   4. Essential hypertension BP not at goal - will need following during her admission with med titration as needed. Will need to keep in mind cost of additional medicines.   5. Hyperlipidemia - on statin therapy  6. Renal Insufficiency - will need to be followed.   7. Chronic shortness of breath/hypoxia/ILD- sats slowly up to 84%. Still short of breath. See above. She could not get an appointment with pulmonary until later this month - may need their input this admission.   8. Morbid obesity - This remains her most pressing issue. Her diet is poor due to poor social situation. Unfortunately, I do not see this changing. Not able to exercise due to  poor lung function/hypoxia.   9. Cellulitis - will need antibiotics resumed - will add back Doxycycline.   10. ILD - may need pulmonary to see while hospitalized.   11. Chronic anticoagulation - INR today.   Current medicines are reviewed with the patient today.  The patient does not have concerns regarding medicines other than what has been noted above.  The following changes have been made:  See above.  Labs/ tests ordered today  include:       Orders Placed This Encounter  Procedures  . Basic metabolic panel  . CBC  . Hepatic function panel  . Protime-INR  . Pro b natriuretic peptide (BNP)  . EKG 12-Lead     Disposition:   Admission to [facility] on 01/11/2017.   Patient is agreeable to this plan and will call if any problems develop in the interim.   SignedTruitt Merle, NP  01/10/2017 3:15 PM  [facility] [address] [place], [place]  [address] Phone: [phone] Fax: [phone]           Procedures by Diana Junes, NP at 01/10/2017 4:09 PM   Author: [name], NP Author Type: Nurse Practitioner Filed: 01/10/2017 4:09 PM  Note Status: Signed Cosign: Cosign Not Required Encounter Date: 01/10/2017  Editor: Sallee Provencal L        Scan on 01/10/2017 4:09 PM by Myles Rosenthal : [place] on 01/10/2017 4:09 PM by Myles Rosenthal : [place]  Patient Instructions by Diana Junes, NP at 01/10/2017 2:30 PM   Author: [name], NP Author Type: Nurse Practitioner Filed: 01/10/2017 3:07 PM  Note Status: Addendum Cosign: Cosign Not Required Encounter Date: 01/10/2017  Editor: Diana Junes, NP (Nurse Practitioner)  Prior Versions: 1. Diana Junes, NP (Nurse Practitioner) at 01/10/2017 3:07 PM - Addendum   2. Diana Junes, NP (Nurse Practitioner) at 01/10/2017 3:06 PM - Addendum   3. Diana Junes, NP (Nurse Practitioner) at 01/10/2017  2:16 PM - Signed    We will be checking the following labs today - INR, BMET, CBC, HPF and BNP   Medication Instructions:    Continue with your current medicines. BUT  Take 120 mg of Lasix tonight.     Testing/Procedures To Be Arranged:  N/A  Follow-Up:   Will plan on elective admission tomorrow - be ready to go when they call to come to [facility].     Other Special Instructions:   N/A    If you need a refill on your cardiac medications before your next appointment, please call your pharmacy.   Call the [place] office at [phone] if you have any questions, problems or concerns.         Instructions   Patient Instructions           After Visit Summary (Printed 01/10/2017)  Communications      [facility] Provider CC Chart Rep sent to Diana Aly, FNP     Chart Routed to Belva Crome, MD  Media   Electronic signature on 01/10/2017 2:20 PM   Communication Routing History   Recipient Method Sent by Date Sent  Diana Aly, FNP Fax [facility] Junes, NP 01/10/2017  Fax: [phone] Phone: [phone]   Orders Placed      Basic metabolic panel     CBC     Pro b natriuretic peptide (BNP)     Protime-INR     Hepatic function panel     EKG 12-Lead    All Encounter Results  Medication Changes        (Completed Course)    Medication List  Visit Diagnoses      S/P AVR    S/P MVR (mitral valve replacement)    Essential hypertension    Shortness of breath    Problem List  Level of Service  Level of Service  PR OFFICE OUTPATIENT VISIT 40 MINUTES [99215]  All Charges for This Encounter   Code  59136  Description: PR OFFICE OUTPATIENT VISIT 40 MINUTES  Service Date: 01/10/2017  Service Provider: [name], NP  Qty: 1  [id number]  Description: PR CURRENT TOBACCO NON-USER  Service Date: 01/10/2017  Service Provider: [name], NP  Qty: 1  [id number]  Description: PR  ELECTROCARDIOGRAM, COMPLETE  Service Date: 01/10/2017  Service Provider: [name], NP  Qty: 1

## 2017-01-11 NOTE — Progress Notes (Signed)
Patient says she takes Zyrtec at home and did not want to take montelukast for tonight.

## 2017-01-11 NOTE — Progress Notes (Signed)
Patient refuses bed alarm, educated about calling for help.

## 2017-01-11 NOTE — Progress Notes (Addendum)
ANTICOAGULATION CONSULT NOTE - Initial Consult  Pharmacy Consult for Warfarin Indication:  Mechanical Valve  No Known Allergies  Patient Measurements: Height: 5' 6.5" (168.9 cm) Weight: (!) 346 lb 2 oz (157 kg) (scale A) IBW/kg (Calculated) : 60.45 Heparin Dosing Weight: 100 kg  Vital Signs: Temp: 98.3 F (36.8 C) (08/14 1002) Temp Source: Oral (08/14 1002) BP: 121/64 (08/14 1002) Pulse Rate: 62 (08/14 1002)  Labs:  Recent Labs  01/10/17 1520  HGB 14.9  HCT 47.3*  PLT 194  LABPROT 36.1*  INR 3.7*  CREATININE 1.31*    Estimated Creatinine Clearance: 74.1 mL/min (A) (by C-G formula based on SCr of 1.31 mg/dL (H)).   Medical History: Past Medical History:  Diagnosis Date  . Allergic rhinitis   . Anemia   . Anxiety   . Aortic stenosis   . Arthritis    "lower back" (11/30/2016)  . CAO (chronic airflow obstruction) (HCC)   . Cellulitis of left lower extremity 11/30/2016  . Depressive disorder   . Gout   . History of blood transfusion 03/2016   "I was anemic"  . HTN (hypertension)   . Hyperlipidemia   . Hypertriglyceridemia   . Kidney disease    Dr. Graylon Gunning  . Menopausal symptoms   . Mitral and aortic heart valve diseases, unspecified 07/2014   s/p AVR with #19 St Jude and s/p MVR with 72mm St. Jude per [name] at [facility]  . Mitral stenosis   . Morbid obesity ([place])   . Noninfectious lymphedema   . On home oxygen therapy    "2-3L when I'm up doing a whole lot" (11/30/2016)  . Renal insufficiency   . Shortness of breath    with exertion  . Sleep apnea   . Undiagnosed cardiac murmurs   . Vitamin D deficiency     Medications:  Prescriptions Prior to Admission  Medication Sig Dispense Refill Last Dose  . acetaminophen (TYLENOL) 650 MG CR tablet Take 1,300 mg by mouth 2 (two) times daily.    01/11/2017 at AM  . albuterol (PROVENTIL HFA;VENTOLIN HFA) 108 (90 Base) MCG/ACT inhaler Inhale 2 puffs into the lungs 2 (two) times daily as needed for wheezing or  shortness of breath. Reported on 06/20/2015 3.7 g 5 PRN  . albuterol (PROVENTIL) (2.5 MG/3ML) 0.083% nebulizer solution Inhale 3 mLs (2.5 mg total) into the lungs every 4 (four) hours as needed. (Patient taking differently: Inhale 2.5 mg into the lungs every 4 (four) hours as needed for wheezing or shortness of breath. ) 150 mL 5 PRN  . allopurinol (ZYLOPRIM) 300 MG tablet Take 300 mg by mouth at bedtime.    01/10/2017 at Unknown time  . aspirin EC 81 MG tablet Take 81 mg by mouth daily.   01/11/2017 at Unknown time  . budesonide-formoterol (SYMBICORT) 80-4.5 MCG/ACT inhaler Inhale 2 puffs into the lungs 2 (two) times daily. Reported on 06/20/2015 1 Inhaler 11 01/11/2017 at AM  . buPROPion (WELLBUTRIN XL) 300 MG 24 hr tablet Take 300 mg by mouth daily.    01/11/2017 at Unknown time  . cetirizine (ZYRTEC) 10 MG tablet Take 1 tablet (10 mg total) by mouth daily. (Patient taking differently: Take 10 mg by mouth at bedtime. ) 30 tablet 5 01/10/2017 at Unknown time  . Cholecalciferol (VITAMIN D3) 2000 UNITS TABS Take 2,000 Units by mouth daily.    01/11/2017 at Unknown time  . citalopram (CELEXA) 20 MG tablet Take 20 mg by mouth daily.    01/11/2017  at Unknown time  . furosemide (LASIX) 80 MG tablet Take 1 tablet (80 mg total) by mouth 2 (two) times daily. 60 tablet 0 01/11/2017 at AM  . losartan (COZAAR) 50 MG tablet Take 1 tablet (50 mg total) by mouth daily. 90 tablet 3 01/11/2017 at AM  . metoprolol (LOPRESSOR) 50 MG tablet Take 50 mg by mouth 2 (two) times daily.   01/11/2017 at 0830  . montelukast (SINGULAIR) 10 MG tablet Take 10 mg by mouth daily.    01/11/2017 at Unknown time  . OXYGEN Inhale 2.5-3 L into the lungs continuous.    01/11/2017 at Unknown time  . pravastatin (PRAVACHOL) 80 MG tablet Take 80 mg by mouth daily.    01/11/2017 at Unknown time  . warfarin (COUMADIN) 7.5 MG tablet TAKE 1 TABLET BY MOUTH DAILY OR AS DIRECTED BY COUMADIN CLINIC (Patient taking differently: Take 3.75-7.5 mg by mouth See admin  instructions. Take 7.5 mg by mouth at bedtime on Sun/Tues/Thurs/Sat and take 3.75 mg by mouth at bedtime on Mon/Wed/Fri) 90 tablet 0 01/10/2017 at 1930  . triamcinolone (NASACORT AQ) 55 MCG/ACT AERO nasal inhaler Place 2 sprays into the nose daily. (Patient taking differently: Place 2 sprays into the nose daily as needed (for seasonal allergies). ) 1 Inhaler 5 PRN   Scheduled:  . allopurinol  300 mg Oral Daily  . aspirin EC  81 mg Oral Daily  . buPROPion  300 mg Oral Daily  . cholecalciferol  2,000 Units Oral Daily  . citalopram  20 mg Oral Daily  . doxycycline  100 mg Oral Q12H  . furosemide  80 mg Intravenous Q8H  . loratadine  10 mg Oral Daily  . losartan  50 mg Oral Daily  . metoprolol tartrate  50 mg Oral BID  . montelukast  10 mg Oral QHS  . pravastatin  80 mg Oral q1800  . sodium chloride flush  3 mL Intravenous Q12H  . triamcinolone  2 spray Each Nare Daily    Assessment: 56 y.o female h/o mechanical MVR and mechanical AVR (07/2014), admitted today 8/14 for acute on chronic diastolic HF, needing IV diuresis.  CBC wnl and no bleeding reported.  INR is 3.7 on admit, supratherapeutic INR slightly above goal 2.5-3.5.  INR is down from last oupt AC last week 8/6 when INR was 4.6 on 7.5mg  qTTSS and 3.75mg  qMWF.  This dosage was held x 1 day (8/6) then resumed same dose. Now INR is 3.7.   She has been on antibiotics amoxicillin and doxycycline recently (July 2018).   Doxycylcine restarted today 8/14 for cellulitis,  Doxy may increase coumadin effect.   PTA warfarin dose: 7.5 mg every TTSS and 3.75 mg every MWF, last taken PTA on 01/10/17 @19 :30.  Goal of Therapy:  INR 2.5-3.5  Monitor platelets by anticoagulation protocol: Yes   Plan:  Hold coumadin dose today. Daily INR  Thank you for allowing pharmacy to be part of this patients care team.  Nicole Cella, [place] Clinical Pharmacist Pager: [phone] 8A-4P [phone] 4P-10P [phone] [place] [phone] 01/11/2017,10:53 AM

## 2017-01-12 ENCOUNTER — Inpatient Hospital Stay (HOSPITAL_COMMUNITY): Payer: Medicare Other

## 2017-01-12 DIAGNOSIS — N171 Acute kidney failure with acute cortical necrosis: Secondary | ICD-10-CM

## 2017-01-12 DIAGNOSIS — Z952 Presence of prosthetic heart valve: Secondary | ICD-10-CM

## 2017-01-12 DIAGNOSIS — Z7901 Long term (current) use of anticoagulants: Secondary | ICD-10-CM

## 2017-01-12 DIAGNOSIS — Z5181 Encounter for therapeutic drug level monitoring: Secondary | ICD-10-CM

## 2017-01-12 DIAGNOSIS — N183 Chronic kidney disease, stage 3 (moderate): Secondary | ICD-10-CM

## 2017-01-12 LAB — BASIC METABOLIC PANEL
Anion gap: 11 (ref 5–15)
BUN: 26 mg/dL — ABNORMAL HIGH (ref 6–20)
CO2: 32 mmol/L (ref 22–32)
Calcium: 9.5 mg/dL (ref 8.9–10.3)
Chloride: 96 mmol/L — ABNORMAL LOW (ref 101–111)
Creatinine, Ser: 1.48 mg/dL — ABNORMAL HIGH (ref 0.44–1.00)
GFR calc Af Amer: 44 mL/min — ABNORMAL LOW (ref >60)
GFR calc non Af Amer: 38 mL/min — ABNORMAL LOW (ref >60)
Glucose, Bld: 81 mg/dL (ref 65–99)
Potassium: 4.4 mmol/L (ref 3.5–5.1)
Sodium: 139 mmol/L (ref 135–145)

## 2017-01-12 LAB — PROTIME-INR
INR: 2.46
PROTHROMBIN TIME: 27.1 s — AB (ref 11.4–15.2)

## 2017-01-12 MED ORDER — WARFARIN SODIUM 7.5 MG PO TABS
7.5000 mg | ORAL_TABLET | Freq: Once | ORAL | Status: AC
  Administered 2017-01-12: 7.5 mg via ORAL
  Filled 2017-01-12: qty 1

## 2017-01-12 MED ORDER — FUROSEMIDE 10 MG/ML IJ SOLN
80.0000 mg | Freq: Two times a day (BID) | INTRAMUSCULAR | Status: DC
  Administered 2017-01-12 – 2017-01-13 (×2): 80 mg via INTRAVENOUS
  Filled 2017-01-12 (×2): qty 8

## 2017-01-12 NOTE — Progress Notes (Signed)
ANTICOAGULATION CONSULT NOTE - Follow up Pharmacy Consult for Warfarin Indication:  Mechanical Valve  No Known Allergies  Patient Measurements: Height: 5' 6.5" (168.9 cm) Weight: (!) 343 lb 3.2 oz (155.7 kg) (scale a) IBW/kg (Calculated) : 60.45 Heparin Dosing Weight: 100 kg  Vital Signs: Temp: 97.6 F (36.4 C) (08/15 1158) Temp Source: Oral (08/15 1158) BP: 106/63 (08/15 1158) Pulse Rate: 54 (08/15 1158)  Labs:  Recent Labs  01/10/17 1520 01/12/17 0429 01/12/17 0927  HGB 14.9  --   --   HCT 47.3*  --   --   PLT 194  --   --   LABPROT 36.1*  --  27.1*  INR 3.7*  --  2.46  CREATININE 1.31* 1.48*  --     Estimated Creatinine Clearance: 65.3 mL/min (A) (by C-G formula based on SCr of 1.48 mg/dL (H)).   Medical History: Past Medical History:  Diagnosis Date  . Allergic rhinitis   . Anemia   . Anxiety   . Aortic stenosis   . Arthritis    "lower back" (11/30/2016)  . CAO (chronic airflow obstruction) (HCC)   . Cellulitis of left lower extremity 11/30/2016  . Depressive disorder   . Gout   . History of blood transfusion 03/2016   "I was anemic"  . HTN (hypertension)   . Hyperlipidemia   . Hypertriglyceridemia   . Kidney disease    Dr. Graylon Gunning  . Menopausal symptoms   . Mitral and aortic heart valve diseases, unspecified 07/2014   s/p AVR with #19 St Jude and s/p MVR with 49mm St. Jude per [name] at [facility]  . Mitral stenosis   . Morbid obesity ([place])   . Noninfectious lymphedema   . On home oxygen therapy    "2-3L when I'm up doing a whole lot" (11/30/2016)  . Renal insufficiency   . Shortness of breath    with exertion  . Sleep apnea   . Undiagnosed cardiac murmurs   . Vitamin D deficiency     Medications:  Prescriptions Prior to Admission  Medication Sig Dispense Refill Last Dose  . acetaminophen (TYLENOL) 650 MG CR tablet Take 1,300 mg by mouth 2 (two) times daily.    01/11/2017 at AM  . albuterol (PROVENTIL HFA;VENTOLIN HFA) 108 (90 Base) MCG/ACT  inhaler Inhale 2 puffs into the lungs 2 (two) times daily as needed for wheezing or shortness of breath. Reported on 06/20/2015 3.7 g 5 PRN  . albuterol (PROVENTIL) (2.5 MG/3ML) 0.083% nebulizer solution Inhale 3 mLs (2.5 mg total) into the lungs every 4 (four) hours as needed. (Patient taking differently: Inhale 2.5 mg into the lungs every 4 (four) hours as needed for wheezing or shortness of breath. ) 150 mL 5 PRN  . allopurinol (ZYLOPRIM) 300 MG tablet Take 300 mg by mouth at bedtime.    01/10/2017 at Unknown time  . aspirin EC 81 MG tablet Take 81 mg by mouth daily.   01/11/2017 at Unknown time  . budesonide-formoterol (SYMBICORT) 80-4.5 MCG/ACT inhaler Inhale 2 puffs into the lungs 2 (two) times daily. Reported on 06/20/2015 1 Inhaler 11 01/11/2017 at AM  . buPROPion (WELLBUTRIN XL) 300 MG 24 hr tablet Take 300 mg by mouth daily.    01/11/2017 at Unknown time  . cetirizine (ZYRTEC) 10 MG tablet Take 1 tablet (10 mg total) by mouth daily. (Patient taking differently: Take 10 mg by mouth at bedtime. ) 30 tablet 5 01/10/2017 at Unknown time  . Cholecalciferol (VITAMIN D3)  2000 UNITS TABS Take 2,000 Units by mouth daily.    01/11/2017 at Unknown time  . citalopram (CELEXA) 20 MG tablet Take 20 mg by mouth daily.    01/11/2017 at Unknown time  . furosemide (LASIX) 80 MG tablet Take 1 tablet (80 mg total) by mouth 2 (two) times daily. 60 tablet 0 01/11/2017 at AM  . losartan (COZAAR) 50 MG tablet Take 1 tablet (50 mg total) by mouth daily. 90 tablet 3 01/11/2017 at AM  . metoprolol (LOPRESSOR) 50 MG tablet Take 50 mg by mouth 2 (two) times daily.   01/11/2017 at 0830  . montelukast (SINGULAIR) 10 MG tablet Take 10 mg by mouth daily.    01/11/2017 at Unknown time  . OXYGEN Inhale 2.5-3 L into the lungs continuous.    01/11/2017 at Unknown time  . pravastatin (PRAVACHOL) 80 MG tablet Take 80 mg by mouth daily.    01/11/2017 at Unknown time  . warfarin (COUMADIN) 7.5 MG tablet TAKE 1 TABLET BY MOUTH DAILY OR AS DIRECTED  BY COUMADIN CLINIC (Patient taking differently: Take 3.75-7.5 mg by mouth See admin instructions. Take 7.5 mg by mouth at bedtime on Sun/Tues/Thurs/Sat and take 3.75 mg by mouth at bedtime on Mon/Wed/Fri) 90 tablet 0 01/10/2017 at 1930  . triamcinolone (NASACORT AQ) 55 MCG/ACT AERO nasal inhaler Place 2 sprays into the nose daily. (Patient taking differently: Place 2 sprays into the nose daily as needed (for seasonal allergies). ) 1 Inhaler 5 PRN   Scheduled:  . allopurinol  300 mg Oral Daily  . aspirin EC  81 mg Oral Daily  . buPROPion  300 mg Oral Daily  . cholecalciferol  2,000 Units Oral Daily  . citalopram  20 mg Oral Daily  . doxycycline  100 mg Oral Q12H  . furosemide  80 mg Intravenous Q12H  . loratadine  10 mg Oral Daily  . losartan  50 mg Oral Daily  . metoprolol tartrate  50 mg Oral BID  . montelukast  10 mg Oral QHS  . pravastatin  80 mg Oral q1800  . sodium chloride flush  3 mL Intravenous Q12H  . triamcinolone  2 spray Each Nare Daily  . Warfarin - Pharmacist Dosing Inpatient   Does not apply q1800    Assessment: 56 y.o female h/o mechanical MVR and mechanical AVR (07/2014), admitted today 8/14 for acute on chronic diastolic HF, needing IV diuresis.  CBC wnl on 01/10/17.  INR today = 2.46 , down from 3.7 on 8/13,  Admitted 8/14.  Goal INR  2.5-3.5.   No bleeding reported.    PTA :   INR was 4.6 on 7.5mg  qTTSS and 3.75mg  qMWF.  This dosage was held x 1 day (8/6) then resumed same dose. Now INR is 3.7.   She has been on antibiotics amoxicillin and doxycycline recently (July 2018).   Doxycylcine restarted today 8/14 for cellulitis,  Doxy may increase coumadin effect.   PTA warfarin dose: 7.5 mg every TTSS and 3.75 mg every MWF, last taken PTA on 01/10/17 @19 :30.  Goal of Therapy:  INR 2.5-3.5  Monitor platelets by anticoagulation protocol: Yes   Plan:  Coumadin 7.5 mg today x1 Daily INR  Thank you for allowing pharmacy to be part of this patients care team.  Nicole Cella, RPh Clinical Pharmacist Pager: [phone] 8A-4P [phone] 4P-10P [phone] [place] [phone] 01/12/2017,3:30 PM

## 2017-01-12 NOTE — Progress Notes (Signed)
Progress Note  Patient Name: [name] Date of Encounter: 01/12/2017  Primary Cardiologist: [name], NP & Dr. Daneen Schick  Subjective   Pt is feeling better, breathing better at rest. Still short of breath with going to the bathroom. No chest pain, palpitations or dizziness.   Inpatient Medications    Scheduled Meds: . allopurinol  300 mg Oral Daily  . aspirin EC  81 mg Oral Daily  . buPROPion  300 mg Oral Daily  . cholecalciferol  2,000 Units Oral Daily  . citalopram  20 mg Oral Daily  . doxycycline  100 mg Oral Q12H  . furosemide  80 mg Intravenous Q8H  . loratadine  10 mg Oral Daily  . losartan  50 mg Oral Daily  . metoprolol tartrate  50 mg Oral BID  . montelukast  10 mg Oral QHS  . pravastatin  80 mg Oral q1800  . sodium chloride flush  3 mL Intravenous Q12H  . triamcinolone  2 spray Each Nare Daily  . Warfarin - Pharmacist Dosing Inpatient   Does not apply q1800   Continuous Infusions: . sodium chloride     PRN Meds: sodium chloride, acetaminophen, albuterol, ondansetron (ZOFRAN) IV, sodium chloride flush   Vital Signs    Vitals:   01/11/17 2048 01/12/17 0200 01/12/17 0415 01/12/17 0605  BP: (!) 122/57 103/68 (!) 96/40 (!) 102/59  Pulse: (!) 54  (!) 48   Resp:  20 18   Temp:  98.1 F (36.7 C) (!) 97.5 F (36.4 C)   TempSrc:  Oral Oral   SpO2:  97% 98%   Weight:   (!) 343 lb 3.2 oz (155.7 kg)   Height:        Intake/Output Summary (Last 24 hours) at 01/12/17 0945 Last data filed at 01/12/17 4010  Gross per 24 hour  Intake             1690 ml  Output             4250 ml  Net            -2560 ml   Filed Weights   01/11/17 1002 01/12/17 0415  Weight: (!) 346 lb 2 oz (157 kg) (!) 343 lb 3.2 oz (155.7 kg)    Telemetry    Sinus rhythm with 1st degree AVB in the 50's - Personally Reviewed  ECG    1st degree AVB, Sinus at 63 BPM. Poss LAE - Personally Reviewed  Physical Exam   GEN: Obese female, No acute distress.   Neck: No  JVD Cardiac: RRR, mechanical valve click Respiratory: Clear to auscultation bilaterally. GI: Soft, nontender, non-distended  MS: Tight edema of the left leg and redness of the left lower leg. Right leg with profound lymphedema Neuro:  Nonfocal  Psych: Normal affect   Labs    Chemistry Recent Labs Lab 01/10/17 1520 01/12/17 0429  NA 140 139  K 4.7 4.4  CL 97 96*  CO2 30* 32  GLUCOSE 74 81  BUN 24 26*  CREATININE 1.31* 1.48*  CALCIUM 9.4 9.5  PROT 7.6  --   ALBUMIN 4.4  --   AST 24  --   ALT 17  --   ALKPHOS 74  --   BILITOT 0.7  --   GFRNONAA 45* 38*  GFRAA 52* 44*  ANIONGAP  --  11     Hematology Recent Labs Lab 01/10/17 1520  WBC 9.8  RBC 4.99  HGB 14.9  HCT 47.3*  MCV 95  MCH 29.9  MCHC 31.5  RDW 17.7*  PLT 194    Cardiac EnzymesNo results for input(s): TROPONINI in the last 168 hours. No results for input(s): TROPIPOC in the last 168 hours.   BNP Recent Labs Lab 01/10/17 1520  PROBNP 1,062*     DDimer No results for input(s): DDIMER in the last 168 hours.   Radiology    Dg Chest 2 View  Result Date: 01/12/2017 CLINICAL DATA:  Dyspnea. EXAM: CHEST  2 VIEW COMPARISON:  Radiographs of November 30, 2016. FINDINGS: Stable cardiomediastinal silhouette. Status post cardiac valve repair. Stable central pulmonary vascular congestion is noted with probable bilateral pulmonary edema. No pneumothorax or pleural effusion is noted. Bony thorax is unremarkable. IMPRESSION: Stable central pulmonary vascular congestion with probable bilateral pulmonary edema. Electronically Signed   By: Marijo Conception, M.D.   On: 01/12/2017 07:41    Cardiac Studies   Per office note: From CareEverywhere: "Cath and echo showed severe aortic stenosis, moderate mitral stenosis, PHBP, very small LV cavity and small aortic root with preserved ventricular function and no significant coronary disease. The patient was taken to the operating room by [name], on 08/06/2014 where she  underwent Aortic valve replacement 77mm StJude Regent valve, mitral valve replacement 45mm St Jude mechanical valve finding rheumatic disease."  Echocardiogram 12/03/2016 Study Conclusions  - Left ventricle: The cavity size was normal. Wall thickness was   increased in a pattern of moderate LVH. Systolic function was   vigorous. The estimated ejection fraction was in the range of 65%   to 70%. - Aortic valve: AV prosthesis is well seated Peak and mean   gradients through the valve atr 47 and 25 mm Hg respectively   These gradients are lower than those reported in echo of March   2018 - Mitral valve: MV prosthesis is diffiuclt to see well Peak and   mean gradients through the valve are 18 and 8 mm Hg respectively.   MVA by P T1/2 is 1.8 cm2 No significant MR. No significant change   in gradients from echo in March 2018. Valve area by pressure   half-time: 1.8 cm^2. - Pulmonary arteries: PA peak pressure: 43 mm Hg (S).   Patient Profile     56 y.o. female with hx of mechanical AVR & Mechanical MVR 07/2014 at [facility], Post-op aflutter, idiopathic pulmonary hemosiderosis, ILD, polycythemia-requiring phlebotomies, CKD, HTN, HLD, and OSA. Admitted on 8/14 from the office for DOE, increased edema, 25 pound wt gain and hypoxia.  Assessment & Plan    Acute on chronic diastolic CHF -Pt with hx of AVR/MVR and ILD/chronic respiratory failure. -Increased shortness of breath, edema, up 26 pounds since February.  -Admitted for IV diuresis and optimization of respiratory status relative to ILD and OSA. -BNP 1062.  CXR showed stable central pulmonary vascular congestion with probable pulmonary edema.  -Currently on IV lasix 80 mg TID ( has had 3 doses so far).  -Wt is down 3 lbs overnight. 346->343 -3.4L urine output since yesterday. -Continue with diuresis -May benefit from advanced heart failure team  Chronic shortness of breath/hypoxia/ILD -Pt with sats in low 80's at the office -Uses home  oxygen 3L -Has OSA not on CPAP. She says that her sleep study was many years ago.  -Maintaining sats in the 90's on supplemental oxygen -may need pulmonary consult.  CKD -SCr baseline 1.2-1.6.  Today 1.48 (1.31 on admission) -Continue to follow   Atrial flutter -Pt  had aflutter after valve surgery and was on amiodarone for a time. Notes indicate that if pt needs further antiarrhythmic, would need to be different.  -Cardioverted 08/2014 and has maintained sinus rhythm. Now on BB. 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 Pharmacy to manage warfarin. -Per her post-op echos her valves seem to be small for her size.  Essential hypertension -BP was elevated at the office, now running a little low 96-122/40-68. -Continue current medications and monitor.   Hyperlipidemia -On statin therapy. Last lipid panel in Epic was 11/2014, LDL 82. -Will update lipid panel.  Signed, Daune Perch, NP  01/12/2017, 9:45 AM    The patient was seen, examined and discussed with Daune Perch, [name] and I agree with the above.    56 year old morbidly obese female with known chronic diastolic CHF, s/p MVR, AVR on chronic coumadin therapy, HTN, HLP who was admitted with acute on chronic diastolic CHF. Possible LLE cellulitis, possible undiagnosed OSA.  Diuresed 2.5 L overnight with some improvement of symptoms, Crea up, I will decrease lasix to 80 mg iv BID (home dose 80 mg po bid), Crea 1.3 -> 1.48, todays weight 349->346 lbs, baseline 330 lbs. Started on doxycycline for cellulitis, no fever or chills. Possble discharge tomorrow, she will need outpatient sleep study evaluation, ultimate therapy would be weight loss, we will refer to the bariatric center at [facility] at discharge.  Ena Dawley, MD 01/12/2017

## 2017-01-13 ENCOUNTER — Other Ambulatory Visit: Payer: Self-pay | Admitting: Cardiology

## 2017-01-13 ENCOUNTER — Telehealth: Payer: Self-pay | Admitting: Cardiology

## 2017-01-13 DIAGNOSIS — I10 Essential (primary) hypertension: Secondary | ICD-10-CM

## 2017-01-13 DIAGNOSIS — G4733 Obstructive sleep apnea (adult) (pediatric): Secondary | ICD-10-CM

## 2017-01-13 DIAGNOSIS — I5043 Acute on chronic combined systolic (congestive) and diastolic (congestive) heart failure: Secondary | ICD-10-CM

## 2017-01-13 LAB — LIPID PANEL
Cholesterol: 140 mg/dL (ref 0–200)
HDL: 43 mg/dL (ref >40)
LDL Cholesterol: 69 mg/dL (ref 0–99)
Total CHOL/HDL Ratio: 3.3 RATIO
Triglycerides: 138 mg/dL (ref <150)
VLDL: 28 mg/dL (ref 0–40)

## 2017-01-13 LAB — BASIC METABOLIC PANEL
Anion gap: 8 (ref 5–15)
BUN: 25 mg/dL — ABNORMAL HIGH (ref 6–20)
CO2: 36 mmol/L — ABNORMAL HIGH (ref 22–32)
Calcium: 9.4 mg/dL (ref 8.9–10.3)
Chloride: 96 mmol/L — ABNORMAL LOW (ref 101–111)
Creatinine, Ser: 1.36 mg/dL — ABNORMAL HIGH (ref 0.44–1.00)
GFR calc Af Amer: 49 mL/min — ABNORMAL LOW (ref >60)
GFR calc non Af Amer: 42 mL/min — ABNORMAL LOW (ref >60)
Glucose, Bld: 93 mg/dL (ref 65–99)
Potassium: 4.1 mmol/L (ref 3.5–5.1)
Sodium: 140 mmol/L (ref 135–145)

## 2017-01-13 LAB — PROTIME-INR
INR: 2.23
Prothrombin Time: 25.1 seconds — ABNORMAL HIGH (ref 11.4–15.2)

## 2017-01-13 MED ORDER — SPIRONOLACTONE 25 MG PO TABS: 25.0000 mg | ORAL_TABLET | Freq: Every day | ORAL | 5 refills | Status: DC

## 2017-01-13 MED ORDER — SPIRONOLACTONE 25 MG PO TABS
25.0000 mg | ORAL_TABLET | Freq: Every day | ORAL | Status: DC
  Administered 2017-01-13: 25 mg via ORAL
  Filled 2017-01-13: qty 1

## 2017-01-13 MED ORDER — FUROSEMIDE 10 MG/ML IJ SOLN
120.0000 mg | Freq: Two times a day (BID) | INTRAVENOUS | Status: DC
  Filled 2017-01-13: qty 12

## 2017-01-13 MED ORDER — DOXYCYCLINE HYCLATE 100 MG PO TABS: 100.0000 mg | ORAL_TABLET | Freq: Two times a day (BID) | ORAL | 0 refills | Status: DC

## 2017-01-13 MED ORDER — FUROSEMIDE 40 MG PO TABS: 120.0000 mg | ORAL_TABLET | Freq: Two times a day (BID) | ORAL | 0 refills | Status: DC

## 2017-01-13 MED ORDER — FUROSEMIDE 80 MG PO TABS: 120.0000 mg | ORAL_TABLET | Freq: Two times a day (BID) | ORAL | Status: DC

## 2017-01-13 NOTE — Progress Notes (Addendum)
Progress Note  Patient Name: [name] Date of Encounter: 01/13/2017  Primary Cardiologist: [name], NP & Dr. Daneen Schick  Subjective   Pt is feeling significantly better with less pain in LE and improved SOB.   Inpatient Medications    Scheduled Meds: . allopurinol  300 mg Oral Daily  . aspirin EC  81 mg Oral Daily  . buPROPion  300 mg Oral Daily  . cholecalciferol  2,000 Units Oral Daily  . citalopram  20 mg Oral Daily  . doxycycline  100 mg Oral Q12H  . furosemide  80 mg Intravenous Q12H  . loratadine  10 mg Oral Daily  . losartan  50 mg Oral Daily  . metoprolol tartrate  50 mg Oral BID  . montelukast  10 mg Oral QHS  . pravastatin  80 mg Oral q1800  . sodium chloride flush  3 mL Intravenous Q12H  . triamcinolone  2 spray Each Nare Daily  . Warfarin - Pharmacist Dosing Inpatient   Does not apply q1800   Continuous Infusions: . sodium chloride     PRN Meds: sodium chloride, acetaminophen, albuterol, ondansetron (ZOFRAN) IV, sodium chloride flush   Vital Signs    Vitals:   01/12/17 2119 01/12/17 2347 01/13/17 0446 01/13/17 0736  BP: 119/60 (!) 131/58 111/66 (!) 120/55  Pulse: 61 (!) 53  (!) 53  Resp:  18 20 18   Temp:   97.8 F (36.6 C) 97.6 F (36.4 C)  TempSrc:   Oral Oral  SpO2:  94% 94% 94%  Weight:   (!) 339 lb 4.8 oz (153.9 kg)   Height:        Intake/Output Summary (Last 24 hours) at 01/13/17 0841 Last data filed at 01/13/17 0740  Gross per 24 hour  Intake             1200 ml  Output             5375 ml  Net            -4175 ml   Filed Weights   01/11/17 1002 01/12/17 0415 01/13/17 0446  Weight: (!) 346 lb 2 oz (157 kg) (!) 343 lb 3.2 oz (155.7 kg) (!) 339 lb 4.8 oz (153.9 kg)    Telemetry    Sinus rhythm with 1st degree AVB in the 50's - Personally Reviewed  ECG    1st degree AVB, Sinus at 63 BPM. Poss LAE - Personally Reviewed  Physical Exam   GEN: Obese female, No acute distress.   Neck: No JVD Cardiac: RRR,  mechanical valve click Respiratory: Clear to auscultation bilaterally. GI: Soft, nontender, non-distended  MS: Tight edema of the left leg and redness of the left lower leg. Right leg with profound lymphedema Neuro:  Nonfocal  Psych: Normal affect   Labs    Chemistry  Recent Labs Lab 01/10/17 1520 01/12/17 0429 01/13/17 0426  NA 140 139 140  K 4.7 4.4 4.1  CL 97 96* 96*  CO2 30* 32 36*  GLUCOSE 74 81 93  BUN 24 26* 25*  CREATININE 1.31* 1.48* 1.36*  CALCIUM 9.4 9.5 9.4  PROT 7.6  --   --   ALBUMIN 4.4  --   --   AST 24  --   --   ALT 17  --   --   ALKPHOS 74  --   --   BILITOT 0.7  --   --   GFRNONAA 45* 38* 42*  GFRAA 52*  [place]  --  11 8     Hematology  Recent Labs Lab 01/10/17 1520  WBC 9.8  RBC 4.99  HGB 14.9  HCT 47.3*  MCV 95  MCH 29.9  MCHC 31.5  RDW 17.7*  PLT 194    Cardiac EnzymesNo results for input(s): TROPONINI in the last 168 hours. No results for input(s): TROPIPOC in the last 168 hours.   BNP  Recent Labs Lab 01/10/17 1520  PROBNP 1,062*     DDimer No results for input(s): DDIMER in the last 168 hours.   Radiology    Dg Chest 2 View  Result Date: 01/12/2017 CLINICAL DATA:  Dyspnea. EXAM: CHEST  2 VIEW COMPARISON:  Radiographs of November 30, 2016. FINDINGS: Stable cardiomediastinal silhouette. Status post cardiac valve repair. Stable central pulmonary vascular congestion is noted with probable bilateral pulmonary edema. No pneumothorax or pleural effusion is noted. Bony thorax is unremarkable. IMPRESSION: Stable central pulmonary vascular congestion with probable bilateral pulmonary edema. Electronically Signed   By: Marijo Conception, M.D.   On: 01/12/2017 07:41    Cardiac Studies   Per office note: From [facility]: "Cath and echo showed severe aortic stenosis, moderate mitral stenosis, PHBP, very small LV cavity and small aortic root with preserved ventricular function and no significant coronary disease. The patient  was taken to the operating room by [name], on 08/06/2014 where she underwent Aortic valve replacement 63mm StJude Regent valve, mitral valve replacement 17mm St Jude mechanical valve finding rheumatic disease."  Echocardiogram 12/03/2016 Study Conclusions  - Left ventricle: The cavity size was normal. Wall thickness was   increased in a pattern of moderate LVH. Systolic function was   vigorous. The estimated ejection fraction was in the range of 65%   to 70%. - Aortic valve: AV prosthesis is well seated Peak and mean   gradients through the valve atr 47 and 25 mm Hg respectively   These gradients are lower than those reported in echo of March   2018 - Mitral valve: MV prosthesis is diffiuclt to see well Peak and   mean gradients through the valve are 18 and 8 mm Hg respectively.   MVA by P T1/2 is 1.8 cm2 No significant MR. No significant change   in gradients from echo in March 2018. Valve area by pressure   half-time: 1.8 cm^2. - Pulmonary arteries: PA peak pressure: 43 mm Hg (S).   Patient Profile     56 y.o. 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 Admitted on 8/14 from the office for DOE, increased edema, 25 pound wt gain and hypoxia.  Assessment & Plan    Acute on chronic diastolic CHF -Pt with hx of AVR/MVR and ILD/chronic respiratory failure. -Increased shortness of breath, edema, up 26 pounds since February.  -Admitted for IV diuresis and optimization of respiratory status relative to ILD and OSA. -BNP 1062.  CXR showed stable central pulmonary vascular congestion with probable pulmonary edema.  - diuresed 9 lbs since the admission wishes to go home  Chronic shortness of breath/hypoxia/ILD -Pt with sats in low 80's at the office -Uses home oxygen 3L -Has OSA not on CPAP. She says that her sleep study was many years ago.    -Maintaining sats in the 90's on supplemental oxygen -may need pulmonary consult.  CKD -SCr baseline 1.2-1.6.  Today 1.48 (1.31 on admission) -Continue to  follow   Atrial flutter -Pt had aflutter after valve surgery and was on amiodarone for a time. Notes indicate that if pt needs further antiarrhythmic, would need to be different.  -Cardioverted 08/2014 and has maintained sinus rhythm. Now on BB. 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 Pharmacy to manage warfarin. -Per her post-op echos her valves seem to be small for her size.  Essential hypertension -BP was elevated at the office, now running a little low 96-122/40-68. -Continue current medications and monitor.   Hyperlipidemia -On statin therapy. Last lipid panel in Epic was 11/2014, LDL 82. -Will update lipid panel.  Plan:  - discharge home on lasix 120 mg po BID, and spironolactone 25 mg po daily - follow up in the clinic in 1 week with BMP and diuretic adjustment - referral for a sleep study - referral for a bariatric surgery  - continue doxycycline 100 mg po BID for 5 more days (cellulitis)  Ena Dawley, MD 01/13/2017

## 2017-01-13 NOTE — Discharge Instructions (Signed)

## 2017-01-13 NOTE — Care Management Note (Signed)
Case Management Note  Patient Details  Name: [name] MRN: [id number] Date of Birth: 1960-06-25  Subjective/Objective:                 Spoke to patient at the bedside. She states she is independent at home, uses [employer] for home oxygen needs [phone]), currently at 3L. Denies difficulty getting to follow ups and obtaining medication.    Action/Plan:   Expected Discharge Date:  01/14/17               Expected Discharge Plan:  Home/Self Care  In-House Referral:     Discharge planning Services  CM Consult  Post Acute Care Choice:    Choice offered to:     DME Arranged:    DME Agency:     HH Arranged:    HH Agency:     Status of Service:  In process, will continue to follow  If discussed at Long Length of Stay Meetings, dates discussed:    Additional Comments:  Carles Collet, RN 01/13/2017, 11:46 AM

## 2017-01-13 NOTE — Progress Notes (Signed)
Patient had no complaints throughout the night, good output, more weight loss this morning.

## 2017-01-13 NOTE — Discharge Summary (Signed)
Discharge Summary    Patient ID: [name],  MRN: [id number], DOB/AGE: 10-18-1960 56 y.o.  Admit date: 01/11/2017 Discharge date: 01/13/2017  Primary Care Provider: [name] Primary Cardiologist: [name] and Dr. 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 Admitted on 8/14 from the office for DOE, increased edema, 25 pound wt gain and hypoxia. When seen in the office on 8/14 the patient was very hypoxic, DOE and feeling  "like a tick - ready to explode". She had reduced her salt intake, cutting out Ramen noodles. She also had ongoing right leg lyphedema and left lower leg cellulitis.   Hospital Course     Consultants: none  BNP was 1062. A chest xray showed stable central pulmonary vascular congestion with probable bilateral pulmonary edema. She was diuresed with furosemide 80 mg bid with decrease in weight of 9 pounds, improvement in breathing and less pain in the legs. She maintained sinus rhythm.  Problems addressed:  Acute on chronic diastolic CHF -Pt with hx of AVR/MVR and ILD/chronic respiratory failure. -Increased shortness of breath, edema, up 26 pounds since February.  -Admitted for IV diuresis and optimization of respiratory status relative to ILD and OSA. -BNP 1062.  CXR showed stable central pulmonary vascular congestion with probable pulmonary edema.  - diuresed 9 lbs since the admission wishes to go home -Discharge wt 339 pounds  Chronic shortness of breath/hypoxia/ILD -Pt with sats in low 80's at the office -Uses home oxygen 3L -Has  OSA not on CPAP. She says that her sleep study was many years ago.  -Maintaining sats in the 90's on supplemental oxygen  CKD -SCr baseline 1.2-1.6.  SCr remained stable. 1.36 at discharge.  Atrial flutter -Pt had aflutter after valve surgery and was on amiodarone for a time. Notes indicate that if pt needs further antiarrhythmic, would need to be different.  -Cardioverted 08/2014 and has maintained sinus rhythm. Now on BB. 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 Pharmacy to manage warfarin. -Per her post-op echos her valves seem to be small for her size. -INR has remained therapeutic. Continue usual warfarin dosing.   Essential hypertension -BP was elevated at the office, now running a little low 96-122/40-68. -Continue current medications   Hyperlipidemia -On statin therapy. Last lipid panel in Epic was 11/2014, LDL 82. -Updated lipid panel: LDL 69  Left leg cellulitis: -Continue doxycycline for 5 more days  Plan:  - discharge home on lasix 120 mg po BID, and spironolactone 25 mg po daily - follow up in the clinic in 1 week with BMP and diuretic adjustment - referral for a sleep study - Order placed and message sent to Sydell Axon.  - referral for a bariatric surgery - Pt given website info. Needs referral.  - continue doxycycline 100 mg po BID for 5 more days (cellulitis)  Patient has been seen by Dr. Meda Coffee today and deemed ready for discharge home. All follow up appointments have been scheduled. Discharge medications are  listed below. _____________  Discharge Vitals Blood pressure (!) 115/48, pulse 61, temperature 97.6 F (36.4 C), temperature source Oral, resp. rate 18, height 5' 6.5" (1.689 m), weight (!) 339 lb 4.8 oz (153.9 kg), SpO2 94 %.  Filed Weights   01/11/17 1002 01/12/17 0415 01/13/17 0446  Weight: (!) 346 lb 2 oz (157 kg)  (!) 343 lb 3.2 oz (155.7 kg) (!) 339 lb 4.8 oz (153.9 kg)    Labs & Radiologic Studies    CBC No results for input(s): WBC, NEUTROABS, HGB, HCT, MCV, PLT in the last 72 hours. Basic Metabolic Panel  Recent Labs  01/12/17 0429 01/13/17 0426  NA 139 140  K 4.4 4.1  CL 96* 96*  CO2 32 36*  GLUCOSE 81 93  BUN 26* 25*  CREATININE 1.48* 1.36*  CALCIUM 9.5 9.4   Liver Function Tests No results for input(s): AST, ALT, ALKPHOS, BILITOT, PROT, ALBUMIN in the last 72 hours. No results for input(s): LIPASE, AMYLASE in the last 72 hours. Cardiac Enzymes No results for input(s): CKTOTAL, CKMB, CKMBINDEX, TROPONINI in the last 72 hours. BNP Invalid input(s): POCBNP D-Dimer No results for input(s): DDIMER in the last 72 hours. Hemoglobin A1C No results for input(s): HGBA1C in the last 72 hours. Fasting Lipid Panel  Recent Labs  01/13/17 0426  CHOL 140  HDL 43  LDLCALC 69  TRIG 138  CHOLHDL 3.3   Thyroid Function Tests No results for input(s): TSH, T4TOTAL, T3FREE, THYROIDAB in the last 72 hours.  Invalid input(s): FREET3 _____________  Dg Chest 2 View  Result Date: 01/12/2017 CLINICAL DATA:  Dyspnea. EXAM: CHEST  2 VIEW COMPARISON:  Radiographs of November 30, 2016. FINDINGS: Stable cardiomediastinal silhouette. Status post cardiac valve repair. Stable central pulmonary vascular congestion is noted with probable bilateral pulmonary edema. No pneumothorax or pleural effusion is noted. Bony thorax is unremarkable. IMPRESSION: Stable central pulmonary vascular congestion with probable bilateral pulmonary edema. Electronically Signed   By: Marijo Conception, M.D.   On: 01/12/2017 07:41   Disposition   Pt is being discharged home today in good condition.  Follow-up Plans & Appointments    Follow-up Information    [name], FNP Follow up on 01/20/2017.   Specialty:  Nurse Practitioner Why:  3:45 PM. Contact information: [place]  [address] [phone]        Isaiah Serge, NP Follow up.   Specialties:  Cardiology, Radiology Why:  You have a follow up appointment with Cecilie Kicks on August 23rd at 11:30 to review your diuretics and have labs checked.  Contact information: [place] [address] [phone]        [place] [facility] Follow up.   Specialty:  Cardiology Why:  You will be referred to the bariatric surery center at [facility]. You can call them for more information. You can attend a free seminar. Go to website for bariatric surgery on [place] website to sign up. Contact information: [address] [place] [phone]         Discharge Instructions    ([place]) Call MD:  Anytime you have any of the following symptoms: 1) 3 pound weight gain in 24 hours or 5 pounds in 1 week 2) shortness of breath, with or without a dry hacking cough 3) swelling in the hands, feet or stomach 4) if you have to sleep on extra pillows  at night in order to breathe.    Complete by:  As directed    Diet - low sodium heart healthy    Complete by:  As directed    Increase activity slowly    Complete by:  As directed       Discharge Medications   Current Discharge Medication List    START taking these medications   Details  doxycycline (VIBRA-TABS) 100 MG tablet Take 1 tablet (100 mg total) by mouth every 12 (twelve) hours. Qty: 10 tablet, Refills: 0   Associated Diagnoses: S/P AVR; S/P MVR (mitral valve replacement); Shortness of breath    spironolactone (ALDACTONE) 25 MG tablet Take 1 tablet (25 mg total) by mouth daily. Qty: 30 tablet, Refills: 5      CONTINUE these medications which have CHANGED   Details  furosemide (LASIX) 40 MG tablet Take 3 tablets (120 mg total) by mouth 2 (two) times daily. Qty: 180 tablet, Refills: 0      CONTINUE these medications which have NOT CHANGED   Details   acetaminophen (TYLENOL) 650 MG CR tablet Take 1,300 mg by mouth 2 (two) times daily.     albuterol (PROVENTIL HFA;VENTOLIN HFA) 108 (90 Base) MCG/ACT inhaler Inhale 2 puffs into the lungs 2 (two) times daily as needed for wheezing or shortness of breath. Reported on 06/20/2015 Qty: 3.7 g, Refills: 5    albuterol (PROVENTIL) (2.5 MG/3ML) 0.083% nebulizer solution Inhale 3 mLs (2.5 mg total) into the lungs every 4 (four) hours as needed. Qty: 150 mL, Refills: 5    allopurinol (ZYLOPRIM) 300 MG tablet Take 300 mg by mouth at bedtime.     aspirin EC 81 MG tablet Take 81 mg by mouth daily.    budesonide-formoterol (SYMBICORT) 80-4.5 MCG/ACT inhaler Inhale 2 puffs into the lungs 2 (two) times daily. Reported on 06/20/2015 Qty: 1 Inhaler, Refills: 11    buPROPion (WELLBUTRIN XL) 300 MG 24 hr tablet Take 300 mg by mouth daily.     cetirizine (ZYRTEC) 10 MG tablet Take 1 tablet (10 mg total) by mouth daily. Qty: 30 tablet, Refills: 5    Cholecalciferol (VITAMIN D3) 2000 UNITS TABS Take 2,000 Units by mouth daily.     citalopram (CELEXA) 20 MG tablet Take 20 mg by mouth daily.     losartan (COZAAR) 50 MG tablet Take 1 tablet (50 mg total) by mouth daily. Qty: 90 tablet, Refills: 3    metoprolol (LOPRESSOR) 50 MG tablet Take 50 mg by mouth 2 (two) times daily.    montelukast (SINGULAIR) 10 MG tablet Take 10 mg by mouth daily.     OXYGEN Inhale 2.5-3 L into the lungs continuous.     pravastatin (PRAVACHOL) 80 MG tablet Take 80 mg by mouth daily.     warfarin (COUMADIN) 7.5 MG tablet TAKE 1 TABLET BY MOUTH DAILY OR AS DIRECTED BY COUMADIN CLINIC Qty: 90 tablet, Refills: 0    triamcinolone (NASACORT AQ) 55 MCG/ACT AERO nasal inhaler Place 2 sprays into the nose daily. Qty: 1 Inhaler, Refills: 5         Outstanding Labs/Studies   Needs BMP in 1 week  Duration of Discharge Encounter   Greater than 30 minutes including physician time.  Signed, Daune Perch NP 01/13/2017, 3:36  PM

## 2017-01-13 NOTE — Telephone Encounter (Signed)
New message    TOC appt made per Pecolia Ades with Cecilie Kicks on 8/23 at 1130.

## 2017-01-14 ENCOUNTER — Telehealth: Payer: Self-pay | Admitting: *Deleted

## 2017-01-14 NOTE — Telephone Encounter (Signed)
S/w pt to make aware Cecille Rubin wanted to see pt back next week.  Moved from Cecilie Kicks, NP schedule to [place].

## 2017-01-17 NOTE — Telephone Encounter (Signed)
Patient contacted regarding discharge from [facility] on 01/13/2017.  Patient understands to follow up with provider [name], NP on 01/18/2017 at 3:30 at [address]. Patient understands discharge instructions? Yes Patient understands medications and regiment? Yes Patient understands to bring all medications to this visit? Yes  The pt has no complaints at this time.

## 2017-01-17 NOTE — Progress Notes (Signed)
CARDIOLOGY OFFICE NOTE  Date:  01/18/2017    [name] Date of Birth: 27-Nov-1960 Medical Record #[id number]  PCP:  [name], [place]  Cardiologist:  [name]   Chief Complaint  Patient presents with  . Congestive Heart Failure    Post hospital visit - seen for [name]    History of Present Illness: [name] is a 56 y.o. female who presents today for a post hospital visit. Seen for [name].   She has a hx of valvular heart disease, idiopathic pulmonary hemosiderosis, ILD, polycythemia - requiring phlebotomies, CKD, HTN, HLD, & OSA. She underwent mechanical MVR and mechanical AVR with [name] at [facility] 07/2014. From [facility]where: "Cath and echo showed severe aortic stenosis, moderate mitral stenosis, PHBP, very small LV cavity and small aortic root with preserved ventricular function and no significant coronary disease. The patient was taken to the operating room by [name], on 08/06/2014 where she underwent Aortic valve replacement 40mm StJude Regent valve, mitral valve replacement 62mm St Jude mechanical valve finding rheumatic disease."      Post op she was noted to be in AFlutter. Rate was uncontrolled. Amiodarone dose was increased as well as the beta-blocker. She has had tolerance issues with amiodarone but was subsequently cardioverted and was able to have her amiodarone stopped in July of 2016 - noted that she would need different AAD if needed for recurrent arrhythmia. She underwent cardioversion on 4/21 - the dictated note says the post EKG was atrial flutter however the EKG post cardioversion showed NSR. We brought her back for an early post cardioversion visit to recheck/verify her EKG - she was in NSR. She was able to have her Midodrine stopped that she had been put on during her surgery.               Back in November of 2016 - I saw her - she was short of breath - I got a CBC - Horribly anemic - I sent her to GI for  colonoscopy and she had to be transfused as well.   Last seen by me back last August of 2017 - she was doing ok. Rarely using oxygen at that time. Continued to see Dr. Lake [name]. I last saw her in February and she was doing ok. BP running up and suspected this was due to excess salt - eating Roman noodles for almost every meal due to cost.   Echo updated back in July and reviewed with [name] - her valves are noted to be undersized.    I then saw her earlier this month - lots of swelling. Weight was up - she had had cellulitis and had been admitted previously. Ended up admitting for IV diuresis. Was diuresed down 9 pounds. Sent home on 120 mg of Lasix BID.     Comes in today. Here alone. She says she feels better. She is back up about 5 pounds. She is not taking her Lasix correctly. Only taking 120 mg total a day. Her swelling has improved. Cellulitis of the left lower leg has improved. She just finished her antibiotic. She has chronic lymphedema of the right leg from a remote injury. No chest pain. Her breathing has improved. She is thinking about bariatric surgery. Her twin sister has had and was successful.   Past Medical History:  Diagnosis Date  . Allergic rhinitis   . Anemia   . Anxiety   . Aortic stenosis   .  Arthritis    "lower back" (11/30/2016)  . CAO (chronic airflow obstruction) (HCC)   . Cellulitis of left lower extremity 11/30/2016  . Depressive disorder   . Gout   . History of blood transfusion 03/2016   "I was anemic"  . HTN (hypertension)   . Hyperlipidemia   . Hypertriglyceridemia   . Kidney disease    Dr. Graylon Gunning  . Menopausal symptoms   . Mitral and aortic heart valve diseases, unspecified 07/2014   s/p AVR with #19 St Jude and s/p MVR with 57mm St. Jude per [name] at [facility]  . Mitral stenosis   . Morbid obesity ([place])   . Noninfectious lymphedema   . On home oxygen therapy    "2-3L when I'm up doing a whole lot" (11/30/2016)  . Renal insufficiency   .  Shortness of breath    with exertion  . Sleep apnea   . Undiagnosed cardiac murmurs   . Vitamin D deficiency     Past Surgical History:  Procedure Laterality Date  . AORTIC AND MITRAL VALVE REPLACEMENT  07/2014   s/p AVR with #19 St Jude and s/p MVR with 75mm St. Jude per [name] at [facility]  . CARDIAC CATHETERIZATION  07/2014  . CARDIAC VALVE REPLACEMENT    . CARDIOVERSION N/A 09/19/2014   Procedure: CARDIOVERSION;  Surgeon: Sanda Klein, MD;  [facility];  Service: Cardiovascular;  Laterality: N/A;  . [place]  . LUNG BIOPSY Left 10/03/2013   Procedure: Left Lung Biopsy;  Surgeon: Melrose Nakayama, MD;  Location: [facility];  Service: Thoracic;  Laterality: Left;  . TONSILLECTOMY    . VIDEO ASSISTED THORACOSCOPY Left 10/03/2013   Procedure: Left Video Assited Thoracoscopy;  Surgeon: Melrose Nakayama, MD;  Location: [facility];  Service: Thoracic;  Laterality: Left;  Marland Kitchen VIDEO BRONCHOSCOPY Bilateral 10/25/2012   Procedure: VIDEO BRONCHOSCOPY WITH FLUORO;  Surgeon: Kathee Delton, MD;  Location: [facility];  Service: Cardiopulmonary;  Laterality: Bilateral;     Medications: Current Meds  Medication Sig  . acetaminophen (TYLENOL) 650 MG CR tablet Take 1,300 mg by mouth every 8 (eight) hours as needed for pain.   Marland Kitchen albuterol (PROVENTIL HFA;VENTOLIN HFA) 108 (90 Base) MCG/ACT inhaler Inhale 2 puffs into the lungs 2 (two) times daily as needed for wheezing or shortness of breath. Reported on 06/20/2015  . albuterol (PROVENTIL) (2.5 MG/3ML) 0.083% nebulizer solution Inhale 3 mLs (2.5 mg total) into the lungs every 4 (four) hours as needed. (Patient taking differently: Inhale 2.5 mg into the lungs every 4 (four) hours as needed for wheezing or shortness of breath. )  . allopurinol (ZYLOPRIM) 300 MG tablet Take 300 mg by mouth at bedtime.   Marland Kitchen aspirin EC 81 MG tablet Take 81 mg by mouth daily.  Marland Kitchen buPROPion (WELLBUTRIN XL) 300 MG 24 hr tablet Take 300 mg by mouth daily.   .  cetirizine (ZYRTEC) 10 MG tablet Take 1 tablet (10 mg total) by mouth daily. (Patient taking differently: Take 10 mg by mouth at bedtime. )  . Cholecalciferol (VITAMIN D3) 2000 UNITS TABS Take 2,000 Units by mouth daily.   . citalopram (CELEXA) 20 MG tablet Take 20 mg by mouth daily.   . furosemide (LASIX) 40 MG tablet Take 3 tablets (120 mg total) by mouth 2 (two) times daily.  Marland Kitchen losartan (COZAAR) 50 MG tablet Take 1 tablet (50 mg total) by mouth daily.  . metoprolol (LOPRESSOR) 50 MG tablet Take 50 mg  by mouth 2 (two) times daily.  . montelukast (SINGULAIR) 10 MG tablet Take 10 mg by mouth daily.   . OXYGEN Inhale 2.5-3 L into the lungs continuous.   . pravastatin (PRAVACHOL) 80 MG tablet Take 80 mg by mouth daily.   Marland Kitchen spironolactone (ALDACTONE) 25 MG tablet Take 1 tablet (25 mg total) by mouth daily.  Marland Kitchen triamcinolone (NASACORT AQ) 55 MCG/ACT AERO nasal inhaler Place 2 sprays into the nose daily. (Patient taking differently: Place 2 sprays into the nose daily as needed (for seasonal allergies). )  . warfarin (COUMADIN) 7.5 MG tablet TAKE 1 TABLET BY MOUTH DAILY OR AS DIRECTED BY COUMADIN CLINIC (Patient taking differently: Take 3.75-7.5 mg by mouth See admin instructions. Take 7.5 mg by mouth at bedtime on Sun/Tues/Thurs/Sat and take 3.75 mg by mouth at bedtime on Mon/Wed/Fri)  . [DISCONTINUED] doxycycline (VIBRA-TABS) 100 MG tablet Take 1 tablet (100 mg total) by mouth every 12 (twelve) hours.     Allergies: No Known Allergies  Social History: The patient  reports that she quit smoking about 3 years ago. Her smoking use included Cigarettes. She has a 35.00 pack-year smoking history. She has never used smokeless tobacco. She reports that she does not drink alcohol or use drugs.   Family History: The patient's family history includes Cancer in her mother; Dementia in her mother; Diabetes in her brother; Emphysema in her mother; Heart disease in her father; Hypertension in her brother, father,  mother, and sister; Kidney disease in her father; Kidney failure in her father; Stroke in her brother.   Review of Systems: Please see the history of present illness.   Otherwise, the review of systems is positive for none.   All other systems are reviewed and negative.   Physical Exam: VS:  BP 120/90 (BP Location: Left Arm, Patient Position: Sitting, Cuff Size: Large)   Pulse (!) 56   Ht 5\' 6"  (1.676 m)   Wt (!) 344 lb 9.6 oz (156.3 kg)   SpO2 (!) 88% Comment: on 4 liters/93 after several deep  breaths/  BMI 55.62 kg/m  .  BMI Body mass index is 55.62 kg/m.  Wt Readings from Last 3 Encounters:  01/18/17 (!) 344 lb 9.6 oz (156.3 kg)  01/13/17 (!) 339 lb 4.8 oz (153.9 kg)  01/10/17 (!) 350 lb 6.4 oz (158.9 kg)    General: Pleasant. Obese female. She is alert and in no acute distress.   HEENT: Normal.  Neck: Supple, no JVD, carotid bruits, or masses noted.  Cardiac: Regular rate and rhythm. Heart tones are distant. Chronic lymphedema on the right. Less edema on the right. Resolving cellulitis of the left leg.  Respiratory:  Lungs are clear to auscultation bilaterally with normal work of breathing.  GI: Soft and nontender.  MS: No deformity or atrophy. Gait and ROM intact.  Skin: Warm and dry. Color is normal.  Neuro:  Strength and sensation are intact and no gross focal deficits noted.  Psych: Alert, appropriate and with normal affect.   LABORATORY DATA:  EKG:  EKG is not ordered today.  Lab Results  Component Value Date   WBC 9.8 01/10/2017   HGB 14.9 01/10/2017   HCT 47.3 (H) 01/10/2017   PLT 194 01/10/2017   GLUCOSE 93 01/13/2017   CHOL 140 01/13/2017   TRIG 138 01/13/2017   HDL 43 01/13/2017   LDLCALC 69 01/13/2017   ALT 17 01/10/2017   AST 24 01/10/2017   NA 140 01/13/2017   K 4.1  01/13/2017   CL 96 (L) 01/13/2017   CREATININE 1.36 (H) 01/13/2017   BUN 25 (H) 01/13/2017   CO2 36 (H) 01/13/2017   TSH 3.57 09/18/2014   INR 3.8 01/18/2017     BNP (last 3  results)  Recent Labs  11/30/16 1326  BNP 221.3*    ProBNP (last 3 results)  Recent Labs  01/10/17 1520  PROBNP 1,062*     Other Studies Reviewed Today:  Echo Study Conclusions 11/2016  - Left ventricle: The cavity size was normal. Wall thickness was   increased in a pattern of moderate LVH. Systolic function was   vigorous. The estimated ejection fraction was in the range of 65%   to 70%. - Aortic valve: AV prosthesis is well seated Peak and mean   gradients through the valve atr 47 and 25 mm Hg respectively   These gradients are lower than those reported in echo of March   2018 - Mitral valve: MV prosthesis is diffiuclt to see well Peak and   mean gradients through the valve are 18 and 8 mm Hg respectively.   MVA by P T1/2 is 1.8 cm2 No significant MR. No significant change   in gradients from echo in March 2018. Valve area by pressure   half-time: 1.8 cm^2. - Pulmonary arteries: PA peak pressure: 43 mm Hg (S).   Assessment/Plan:  Acute on chronic diastolic CHF - her weight is already going back up - not taking her Lasix as instructed - actually on much less than prior to this last admission. Increasing to 120 mg BID for 10 days - then 120 mg in the AM and 80 mg in PM. Lab today.    Chronic shortness of breath/hypoxia/ILD -Uses home oxygen 3L - has been referred for sleep study  CKD -Repeat lab today.  Atrial flutter -Pt had aflutter after valve surgery and was on amiodarone for a time. Notes indicate that if pt needs further antiarrhythmic, would need to be different.  -Cardioverted 08/2014 and has maintained sinus rhythm. Now on BB. 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 Pharmacy to manage warfarin. -Per her post-op echos her valves seem to be small for her size. -INR has remained therapeutic. Continue usual warfarin  dosing.   Essential hypertension -BP ok here today -Continue current medications  Hyperlipidemia -On statin therapy. Last lipid panel in Epic was 11/2014, LDL 82. -Updated lipid panel: LDL 69  Left leg cellulitis: -has just finished her doxycycline   Morbid obesity: she is considering bariatric surgery   Current medicines are reviewed with the patient today.  The patient does not have concerns regarding medicines other than what has been noted above.  The following changes have been made:  See above.  Labs/ tests ordered today include:    Orders Placed This Encounter  Procedures  . Basic metabolic panel     Disposition:   FU with me in about 4 weeks.   Patient is agreeable to this plan and will call if any problems develop in the interim.   SignedTruitt Merle, NP  01/18/2017 4:48 PM  [place] [address] [place], [place]  [address] Phone: [phone] Fax: [phone]

## 2017-01-18 ENCOUNTER — Ambulatory Visit (INDEPENDENT_AMBULATORY_CARE_PROVIDER_SITE_OTHER): Payer: Self-pay

## 2017-01-18 ENCOUNTER — Telehealth: Payer: Self-pay | Admitting: *Deleted

## 2017-01-18 ENCOUNTER — Ambulatory Visit (INDEPENDENT_AMBULATORY_CARE_PROVIDER_SITE_OTHER): Payer: Medicare Other | Admitting: Nurse Practitioner

## 2017-01-18 ENCOUNTER — Encounter: Payer: Self-pay | Admitting: Nurse Practitioner

## 2017-01-18 VITALS — BP 120/90 | HR 56 | Ht 66.0 in | Wt 344.6 lb

## 2017-01-18 DIAGNOSIS — I10 Essential (primary) hypertension: Secondary | ICD-10-CM | POA: Diagnosis not present

## 2017-01-18 DIAGNOSIS — Z952 Presence of prosthetic heart valve: Secondary | ICD-10-CM

## 2017-01-18 DIAGNOSIS — I5032 Chronic diastolic (congestive) heart failure: Secondary | ICD-10-CM

## 2017-01-18 DIAGNOSIS — I350 Nonrheumatic aortic (valve) stenosis: Secondary | ICD-10-CM

## 2017-01-18 DIAGNOSIS — G4733 Obstructive sleep apnea (adult) (pediatric): Secondary | ICD-10-CM

## 2017-01-18 DIAGNOSIS — E7800 Pure hypercholesterolemia, unspecified: Secondary | ICD-10-CM | POA: Diagnosis not present

## 2017-01-18 LAB — POCT INR: INR: 3.8

## 2017-01-18 NOTE — Telephone Encounter (Signed)
-----   Message from Daune Perch, NP sent at 01/13/2017  3:09 PM EDT ----- Ola Spurr,  This patient needs a sleep study per Dr. Meda Coffee. I have placed the order and routed it to you. She is being discharged from the hospital today.   Thanks,  Pecolia Ades, NP

## 2017-01-18 NOTE — Patient Instructions (Addendum)
We will be checking the following labs today - BMET   Medication Instructions:    Continue with your current medicines. BUT  I want you to take your Lasix - 3 pills in the AM and 3 pills in the early afternoon for the next 10 days, then take 3 pills in the AM and just 2 pills in the early afternoon.     Testing/Procedures To Be Arranged:  N/A  Follow-Up:   See me in about 4 weeks    Other Special Instructions:   The number for [facility] is [phone]    If you need a refill on your cardiac medications before your next appointment, please call your pharmacy.   Call the [place] office at [phone] if you have any questions, problems or concerns.

## 2017-01-19 ENCOUNTER — Other Ambulatory Visit: Payer: Self-pay | Admitting: *Deleted

## 2017-01-19 DIAGNOSIS — N183 Chronic kidney disease, stage 3 unspecified: Secondary | ICD-10-CM

## 2017-01-19 LAB — BASIC METABOLIC PANEL
BUN/Creatinine Ratio: 21 (ref 9–23)
BUN: 27 mg/dL — ABNORMAL HIGH (ref 6–24)
CO2: 30 mmol/L — ABNORMAL HIGH (ref 20–29)
Calcium: 9.9 mg/dL (ref 8.7–10.2)
Chloride: 95 mmol/L — ABNORMAL LOW (ref 96–106)
Creatinine, Ser: 1.26 mg/dL — ABNORMAL HIGH (ref 0.57–1.00)
GFR calc Af Amer: 55 mL/min/{1.73_m2} — ABNORMAL LOW (ref >59)
GFR calc non Af Amer: 47 mL/min/{1.73_m2} — ABNORMAL LOW (ref >59)
Glucose: 71 mg/dL (ref 65–99)
Potassium: 4.5 mmol/L (ref 3.5–5.2)
Sodium: 140 mmol/L (ref 134–144)

## 2017-01-19 NOTE — Telephone Encounter (Addendum)
Informed patient of upcoming sleep study and patient understanding was verbalized. Patient understands her sleep study will be done at [facility] sleep lab. Patient understands her sleep study is scheduled for Friday March 04 2017. Patient understands she will receive a sleep packet in a week or so. Patient understands to call if she does not receive the sleep packet in a timely manner. Patient agrees with treatment and thanked me for call.

## 2017-01-20 ENCOUNTER — Ambulatory Visit: Payer: Medicare Other | Admitting: Cardiology

## 2017-01-20 DIAGNOSIS — I5032 Chronic diastolic (congestive) heart failure: Secondary | ICD-10-CM | POA: Diagnosis not present

## 2017-01-20 DIAGNOSIS — J449 Chronic obstructive pulmonary disease, unspecified: Secondary | ICD-10-CM | POA: Diagnosis not present

## 2017-01-20 DIAGNOSIS — Z515 Encounter for palliative care: Secondary | ICD-10-CM | POA: Diagnosis not present

## 2017-01-20 DIAGNOSIS — J849 Interstitial pulmonary disease, unspecified: Secondary | ICD-10-CM | POA: Diagnosis not present

## 2017-01-20 DIAGNOSIS — I10 Essential (primary) hypertension: Secondary | ICD-10-CM | POA: Diagnosis not present

## 2017-01-20 DIAGNOSIS — Z6841 Body Mass Index (BMI) 40.0 and over, adult: Secondary | ICD-10-CM | POA: Diagnosis not present

## 2017-01-24 NOTE — Progress Notes (Signed)
January 19, 2017     6:37 PM  Note    Patient understands her sleep study is scheduled for Friday March 04 2017.    January 18, 2017      4:00 PM  Note    ----- Message from Daune Perch, NP sent at 01/13/2017  3:09 PM EDT ----- Ola Spurr,  This patient needs a sleep study per Dr. Meda Coffee. I have placed the order and routed it to you. She is being discharged from the hospital today.   Thanks,  Pecolia Ades, NP

## 2017-01-28 ENCOUNTER — Other Ambulatory Visit: Payer: Medicare Other | Admitting: *Deleted

## 2017-01-28 ENCOUNTER — Ambulatory Visit (INDEPENDENT_AMBULATORY_CARE_PROVIDER_SITE_OTHER): Payer: Medicare Other

## 2017-01-28 DIAGNOSIS — N183 Chronic kidney disease, stage 3 unspecified: Secondary | ICD-10-CM

## 2017-01-28 DIAGNOSIS — I350 Nonrheumatic aortic (valve) stenosis: Secondary | ICD-10-CM

## 2017-01-28 DIAGNOSIS — I10 Essential (primary) hypertension: Secondary | ICD-10-CM | POA: Diagnosis not present

## 2017-01-28 DIAGNOSIS — Z79899 Other long term (current) drug therapy: Secondary | ICD-10-CM | POA: Diagnosis not present

## 2017-01-28 DIAGNOSIS — R7301 Impaired fasting glucose: Secondary | ICD-10-CM | POA: Diagnosis not present

## 2017-01-28 DIAGNOSIS — Z515 Encounter for palliative care: Secondary | ICD-10-CM | POA: Diagnosis not present

## 2017-01-28 DIAGNOSIS — E785 Hyperlipidemia, unspecified: Secondary | ICD-10-CM | POA: Diagnosis not present

## 2017-01-28 DIAGNOSIS — J45901 Unspecified asthma with (acute) exacerbation: Secondary | ICD-10-CM | POA: Diagnosis not present

## 2017-01-28 LAB — POCT INR: INR: 3.6

## 2017-01-29 LAB — BASIC METABOLIC PANEL
BUN/Creatinine Ratio: 16 (ref 9–23)
BUN: 25 mg/dL — ABNORMAL HIGH (ref 6–24)
CO2: 28 mmol/L (ref 20–29)
Calcium: 10.2 mg/dL (ref 8.7–10.2)
Chloride: 92 mmol/L — ABNORMAL LOW (ref 96–106)
Creatinine, Ser: 1.57 mg/dL — ABNORMAL HIGH (ref 0.57–1.00)
GFR calc Af Amer: 42 mL/min/{1.73_m2} — ABNORMAL LOW (ref >59)
GFR calc non Af Amer: 36 mL/min/{1.73_m2} — ABNORMAL LOW (ref >59)
Glucose: 93 mg/dL (ref 65–99)
Potassium: 5.2 mmol/L (ref 3.5–5.2)
Sodium: 138 mmol/L (ref 134–144)

## 2017-02-03 ENCOUNTER — Ambulatory Visit (INDEPENDENT_AMBULATORY_CARE_PROVIDER_SITE_OTHER): Payer: Medicare Other | Admitting: Acute Care

## 2017-02-03 ENCOUNTER — Emergency Department (HOSPITAL_COMMUNITY)
Admission: EM | Admit: 2017-02-03 | Discharge: 2017-02-04 | Disposition: A | Discharge status: Home / Self Care | Payer: Medicare Other | Attending: Emergency Medicine | Admitting: Emergency Medicine

## 2017-02-03 ENCOUNTER — Encounter: Payer: Self-pay | Admitting: Acute Care

## 2017-02-03 ENCOUNTER — Encounter (HOSPITAL_COMMUNITY): Payer: Self-pay | Admitting: *Deleted

## 2017-02-03 VITALS — BP 134/74 | HR 102 | Ht 66.5 in | Wt 328.2 lb

## 2017-02-03 DIAGNOSIS — Z9981 Dependence on supplemental oxygen: Secondary | ICD-10-CM | POA: Insufficient documentation

## 2017-02-03 DIAGNOSIS — H66011 Acute suppurative otitis media with spontaneous rupture of ear drum, right ear: Secondary | ICD-10-CM | POA: Diagnosis not present

## 2017-02-03 DIAGNOSIS — J9611 Chronic respiratory failure with hypoxia: Secondary | ICD-10-CM | POA: Diagnosis not present

## 2017-02-03 DIAGNOSIS — I5031 Acute diastolic (congestive) heart failure: Secondary | ICD-10-CM

## 2017-02-03 DIAGNOSIS — Z87891 Personal history of nicotine dependence: Secondary | ICD-10-CM | POA: Diagnosis not present

## 2017-02-03 DIAGNOSIS — J849 Interstitial pulmonary disease, unspecified: Secondary | ICD-10-CM

## 2017-02-03 DIAGNOSIS — J45909 Unspecified asthma, uncomplicated: Secondary | ICD-10-CM | POA: Diagnosis not present

## 2017-02-03 DIAGNOSIS — G510 Bell's palsy: Secondary | ICD-10-CM | POA: Diagnosis not present

## 2017-02-03 DIAGNOSIS — F419 Anxiety disorder, unspecified: Secondary | ICD-10-CM | POA: Insufficient documentation

## 2017-02-03 DIAGNOSIS — R531 Weakness: Secondary | ICD-10-CM | POA: Diagnosis present

## 2017-02-03 DIAGNOSIS — Z79899 Other long term (current) drug therapy: Secondary | ICD-10-CM | POA: Diagnosis not present

## 2017-02-03 DIAGNOSIS — Z23 Encounter for immunization: Secondary | ICD-10-CM

## 2017-02-03 DIAGNOSIS — Z7902 Long term (current) use of antithrombotics/antiplatelets: Secondary | ICD-10-CM | POA: Insufficient documentation

## 2017-02-03 DIAGNOSIS — H66001 Acute suppurative otitis media without spontaneous rupture of ear drum, right ear: Secondary | ICD-10-CM

## 2017-02-03 DIAGNOSIS — I10 Essential (primary) hypertension: Secondary | ICD-10-CM | POA: Diagnosis not present

## 2017-02-03 DIAGNOSIS — Z7901 Long term (current) use of anticoagulants: Secondary | ICD-10-CM | POA: Insufficient documentation

## 2017-02-03 DIAGNOSIS — G4733 Obstructive sleep apnea (adult) (pediatric): Secondary | ICD-10-CM | POA: Insufficient documentation

## 2017-02-03 DIAGNOSIS — F329 Major depressive disorder, single episode, unspecified: Secondary | ICD-10-CM | POA: Insufficient documentation

## 2017-02-03 NOTE — Assessment & Plan Note (Addendum)
We will place a request for a POC of choice that will meet your oxygen needs. Continue Symbicort and Spiriva as you have been doing. Remember to rinse your mouth after use . Flu shot today Continue your Nasocort and Zytrec as you have been doing Use your rescue nebs and inhaler as needed for shortness of breath and wheezing. Continue wearing your oxygen at 2.5 L at rest and 3L with exertion. Maintain oxygen sats at 88-92% Follow up with [name] in 6 months. Please contact office for sooner follow up if symptoms do not improve or worsen or seek emergency care

## 2017-02-03 NOTE — ED Notes (Signed)
Spoke to Dr. Sherry Ruffing, no CT or labs needed at this time

## 2017-02-03 NOTE — Patient Instructions (Addendum)
It is nice yo meet you today. We will place a request for a POC of choice that will meet your oxygen needs. Sleep Study 03/04/2017 as is already scheduled per cards. CPAP if indicated per cards evaluation of sleep study. Continue Symbicort and Spiriva as you have been doing. Remember to rinse your mouth after use . Flu shot today Continue your Nasocort and Zytrec as you have been doing Use your rescue nebs and inhaler as needed for shortness of breath abd wheezing. Continue wearing your oxygen at 2.5 L at rest and 3L with exertion. Maintain oxygen sats at 88-92% Follow up with Dr. Lake [name] in 6 months. Please contact office for sooner follow up if symptoms do not improve or worsen or seek emergency care

## 2017-02-03 NOTE — Progress Notes (Signed)
History of Present Illness [name] is a 56 y.o. female former smoker, quit 2015 with a 35 pack years smoking history,with asthma, ILD and chronic respiratory failure. She is followed by Dr. Lake [name].  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  02/03/2017 Hospital Follow Up: Pt. Presents for Hospital Follow up after admission for  acute on chronic diastolic heart failure.She ws admitted 01/11/17  from the cardiology office for DOE, increased edema, 25 pound wt gain and hypoxia. BNP was 1062. A chest xray showed stable central pulmonary vascular congestion with probable bilateral pulmonary edema. She was diuresed with furosemide 80 mg bid with decrease in weight of 9 pounds, improvement in breathing and less pain in the legs. She was discharged home on Lasix 120 mg twice daily, home oxygen, and instructions to be compliant with her CPAP.  Pt states she is doing better as she has continued to diurese. .She is down 18-19 pounds, since original admission 01/11/2017.  She states she is at her baseline, no wheezing. She is compliant with her lasix daily. She is monitoring her weight daily.  She is compliant with Spiriva and Symbicort. She is compliant with Nasacort and Zyrtec. She is compliant with her oxygen.She states she did a neb last night, and does so only when she feels she needs it. She maintains her saturations between 88-92%. She is scheduled for a sleep study through cardiology on 03/04/2017. She denies fever, chest pain, orthopnea, or hemoptysis.  Test Results:  November 2016 pulmonary function testing ratio 77%, FEV1 1.74 L (65% predicted), FVC 2.27 L (66% predicted), total lung capacity 4.58 L (90% predicted), DLCO 13.18 (54% predicted). May 2017 pulmonary function testing  normal ratio, FEV1 1.60 L, FVC 1.98 L (58% predicted), total lung capacity 4.56 L (90% predicted), residual volume 2.54 (133% predicted). DLCO 15.75 (64% predicted).  CBC Latest Ref Rng & Units 01/10/2017 12/03/2016 12/01/2016  WBC 3.4 - 10.8 x10E3/uL 9.8 4.4 7.1  Hemoglobin 11.1 - 15.9 g/dL 14.9 15.8(H) 14.4  Hematocrit 34.0 - 46.6 % 47.3(H) 52.2(H) 47.6(H)  Platelets 150 - 379 x10E3/uL 194 176 182    BMP Latest Ref Rng & Units 01/28/2017 01/18/2017 01/13/2017  Glucose 65 - 99 mg/dL 93 71 93  BUN 6 - 24 mg/dL 25(H) 27(H) 25(H)  Creatinine 0.57 - 1.00 mg/dL 1.57(H) 1.26(H) 1.36(H)  BUN/Creat Ratio 9 - 23 16 21  -  Sodium 134 - 144 mmol/L 138 140 140  Potassium 3.5 - 5.2 mmol/L 5.2 4.5 4.1  Chloride 96 - 106 mmol/L 92(L) 95(L) 96(L)  CO2 20 - 29 mmol/L 28 30(H) 36(H)  Calcium 8.7 - 10.2 mg/dL 10.2 9.9 9.4    BNP    Component Value Date/Time   BNP 221.3 (H) 11/30/2016 1326   BNP 281.7 (H) 04/07/2015 1249    ProBNP    Component Value Date/Time   PROBNP 1,062 (H) 01/10/2017 1520   PROBNP 343.0 (H) 09/23/2014 1149    PFT    Component Value Date/Time   FEV1PRE 1.48 10/23/2015 0856   FEV1POST 1.60 10/23/2015 0856   FVCPRE 1.82 10/23/2015 0856   FVCPOST 1.98 10/23/2015 0856   TLC 4.56 10/23/2015 0856   DLCOUNC 15.75 10/23/2015 0856   PREFEV1FVCRT 81 10/23/2015 0856   PSTFEV1FVCRT 81 10/23/2015 0856    Dg Chest 2 View  Result Date:  01/12/2017 CLINICAL DATA:  Dyspnea. EXAM: CHEST  2 VIEW COMPARISON:  Radiographs of November 30, 2016. FINDINGS: Stable cardiomediastinal silhouette. Status post cardiac valve repair. Stable central pulmonary vascular congestion is noted with probable bilateral pulmonary edema. No pneumothorax or pleural effusion is noted. Bony thorax is unremarkable. IMPRESSION: Stable central pulmonary vascular congestion with probable bilateral pulmonary edema. Electronically Signed   By: Marijo Conception, M.D.   On: 01/12/2017 07:41     Past medical hx Past Medical  History:  Diagnosis Date  . Allergic rhinitis   . Anemia   . Anxiety   . Aortic stenosis   . Arthritis    "lower back" (11/30/2016)  . CAO (chronic airflow obstruction) (HCC)   . Cellulitis of left lower extremity 11/30/2016  . Depressive disorder   . Gout   . History of blood transfusion 03/2016   "I was anemic"  . HTN (hypertension)   . Hyperlipidemia   . Hypertriglyceridemia   . Kidney disease    Dr. Graylon Gunning  . Menopausal symptoms   . Mitral and aortic heart valve diseases, unspecified 07/2014   s/p AVR with #19 [employer] Jude and s/p MVR with 36mm [employer] Jude per [name] at [facility]  . Mitral stenosis   . Morbid obesity ([place])   . Noninfectious lymphedema   . On home oxygen therapy    "2-3L when I'm up doing a whole lot" (11/30/2016)  . Renal insufficiency   . Shortness of breath    with exertion  . Sleep apnea   . Undiagnosed cardiac murmurs   . Vitamin D deficiency      Social History  Substance Use Topics  . Smoking status: Former Smoker    Packs/day: 1.00    Years: 35.00    Types: Cigarettes    Quit date: 10/03/2013  . Smokeless tobacco: Never Used  . Alcohol use No    [name] reports that she quit smoking about 3 years ago. Her smoking use included Cigarettes. She has a 35.00 pack-year smoking history. She has never used smokeless tobacco. She reports that she does not drink alcohol or use drugs.  Tobacco Cessation: Former smoker with a 35 pack years smoking history who quit in May 2015  Past surgical hx, Family hx, Social hx all reviewed.  Current Outpatient Prescriptions on File Prior to Visit  Medication Sig  . acetaminophen (TYLENOL) 650 MG CR tablet Take 1,300 mg by mouth every 8 (eight) hours as needed for pain.   Marland Kitchen albuterol (PROVENTIL HFA;VENTOLIN HFA) 108 (90 Base) MCG/ACT inhaler Inhale 2 puffs into the lungs 2 (two) times daily as needed for wheezing or shortness of breath. Reported on 06/20/2015  . albuterol (PROVENTIL) (2.5 MG/3ML) 0.083% nebulizer  solution Inhale 3 mLs (2.5 mg total) into the lungs every 4 (four) hours as needed. (Patient taking differently: Inhale 2.5 mg into the lungs every 4 (four) hours as needed for wheezing or shortness of breath. )  . allopurinol (ZYLOPRIM) 300 MG tablet Take 300 mg by mouth at bedtime.   Marland Kitchen aspirin EC 81 MG tablet Take 81 mg by mouth daily.  . budesonide-formoterol (SYMBICORT) 80-4.5 MCG/ACT inhaler Inhale 2 puffs into the lungs 2 (two) times daily. Reported on 06/20/2015  . buPROPion (WELLBUTRIN XL) 300 MG 24 hr tablet Take 300 mg by mouth daily.   . cetirizine (ZYRTEC) 10 MG tablet Take 1 tablet (10 mg total) by mouth daily. (Patient taking differently: Take 10 mg by mouth at bedtime. )  .  Cholecalciferol (VITAMIN D3) 2000 UNITS TABS Take 2,000 Units by mouth daily.   . citalopram (CELEXA) 20 MG tablet Take 20 mg by mouth daily.   . furosemide (LASIX) 40 MG tablet Take 3 tablets (120 mg total) by mouth 2 (two) times daily.  . metoprolol (LOPRESSOR) 50 MG tablet Take 50 mg by mouth 2 (two) times daily.  . montelukast (SINGULAIR) 10 MG tablet Take 10 mg by mouth daily.   . OXYGEN Inhale 2.5-3 L into the lungs continuous.   . pravastatin (PRAVACHOL) 80 MG tablet Take 80 mg by mouth daily.   Marland Kitchen spironolactone (ALDACTONE) 25 MG tablet Take 1 tablet (25 mg total) by mouth daily.  Marland Kitchen triamcinolone (NASACORT AQ) 55 MCG/ACT AERO nasal inhaler Place 2 sprays into the nose daily. (Patient taking differently: Place 2 sprays into the nose daily as needed (for seasonal allergies). )  . warfarin (COUMADIN) 7.5 MG tablet TAKE 1 TABLET BY MOUTH DAILY OR AS DIRECTED BY COUMADIN CLINIC (Patient taking differently: Take 3.75-7.5 mg by mouth See admin instructions. Take 7.5 mg by mouth at bedtime on Sun/Tues/Thurs/Sat and take 3.75 mg by mouth at bedtime on Mon/Wed/Fri)  . losartan (COZAAR) 50 MG tablet Take 1 tablet (50 mg total) by mouth daily.   No current facility-administered medications on file prior to visit.       No Known Allergies  Review Of Systems:  Constitutional:   No  weight loss, night sweats,  Fevers, chills, fatigue, or  lassitude.  HEENT:   No headaches,  Difficulty swallowing,  Tooth/dental problems, or  Sore throat,                No sneezing, itching, ear ache, nasal congestion, post nasal drip,   CV:  No chest pain,  Orthopnea, PND, swelling in lower extremities, anasarca, dizziness, palpitations, syncope.   GI  No heartburn, indigestion, abdominal pain, nausea, vomiting, diarrhea, change in bowel habits, loss of appetite, bloody stools.   Resp: Baseline shortness of breath with exertion less at rest.  No excess mucus, no productive cough,  No non-productive cough,  No coughing up of blood.  No change in color of mucus.  No wheezing.  No chest wall deformity  Skin: no rash or lesions.  GU: no dysuria, change in color of urine, no urgency or frequency.  No flank pain, no hematuria   MS:  No joint pain or swelling.  No decreased range of motion.  No back pain.  Psych:  No change in mood or affect. No depression or anxiety.  No memory loss.   Vital Signs BP 134/74 (BP Location: Left Arm, Cuff Size: Normal)   Pulse (!) 102   Ht 5' 6.5" (1.689 m)   Wt (!) 328 lb 4 oz (148.9 kg)   SpO2 91%   BMI 52.19 kg/m    Physical Exam:  General- No distress,  A&Ox3, pleasant ENT: No sinus tenderness, TM clear, pale nasal mucosa, no oral exudate,no post nasal drip, no LAN Cardiac: S1, S2, regular rate and rhythm, no murmur Chest: No wheeze/ rales/ dullness; no accessory muscle use, no nasal flaring, no sternal retractions, diminished per bases Abd.: Soft Non-tender, nondistended, obese Ext: No clubbing cyanosis, edema Neuro:  normal strength, cranial nerves intact, appropriate Skin: No rashes, warm and dry Psych: normal mood and behavior   Assessment/Plan  Acute diastolic heart failure (HCC) Recent admission for acute on chronic diastolic heart failure Diuresed for  approximately 18 pound weight loss Plan Continue Lasix as  ordered by cardiology Follow-up cardiology Continue to weigh yourself daily We will place a request for a POC of choice that will meet your oxygen needs. Sleep Study 03/04/2017 as is already scheduled per cards. Continue wearing your oxygen at 2.5 L at rest and 3L with exertion. Maintain oxygen sats at 88-92% Follow up with Dr. Lake [name] in 6 months. Please contact office for sooner follow up if symptoms do not improve or worsen or seek emergency care    Intrinsic asthma We will place a request for a POC of choice that will meet your oxygen needs. Continue Symbicort and Spiriva as you have been doing. Remember to rinse your mouth after use . Flu shot today Continue your Nasocort and Zytrec as you have been doing Use your rescue nebs and inhaler as needed for shortness of breath and wheezing. Continue wearing your oxygen at 2.5 L at rest and 3L with exertion. Maintain oxygen sats at 88-92% Follow up with Dr. Lake [name] in 6 months. Please contact office for sooner follow up if symptoms do not improve or worsen or seek emergency care    Chronic respiratory failure (HCC) Oxygen saturations 91% today on 3 L pulsed Plan Continue wearing oxygen at 2.5 L at rest, and 3 L with exertion. We will place a request for a POC of choice that will meet your oxygen needs. Maintain oxygen sats at 88-92% Follow up with Dr. Lake [name] in 6 months. Please contact office for sooner follow up if symptoms do not improve or worsen or seek emergency care    Obstructive sleep apnea Sleep Study 03/04/2017 as is already scheduled per cards. CPAP as  indicated per cards evaluation of sleep study.   Continue on CPAP at bedtime. You appear to be benefiting from the treatment Goal is to wear for at least 6 hours each night for maximal clinical benefit. Continue to work on weight loss, as the link between excess weight  and sleep apnea is well established.   Do not drive if sleepy. Remember to clean mask, tubing, filter, and reservoir once weekly with soapy water.    Health maintenance Flu shot today   Magdalen Spatz, NP 02/03/2017  4:26 PM

## 2017-02-03 NOTE — Assessment & Plan Note (Signed)
Oxygen saturations 91% today on 3 L pulsed Plan Continue wearing oxygen at 2.5 L at rest, and 3 L with exertion. We will place a request for a POC of choice that will meet your oxygen needs. Maintain oxygen sats at 88-92% Follow up with Dr. Lake [name] in 6 months. Please contact office for sooner follow up if symptoms do not improve or worsen or seek emergency care

## 2017-02-03 NOTE — ED Triage Notes (Addendum)
Pt sent from [facility] for CT and bell's palsy evaluation for stroke like symptoms ongoing since Tuesday. Reports having swelling behind her ears and thought it was related to nasal cannula. Pt reports having decreased sensation to r side of tongue, r sided facial/ mouth droop. When pt closes her eyes, flatten facial folds noted to R side of face, R eye lid closes but not as tight as L eyelid. No arm or leg drift.

## 2017-02-03 NOTE — Assessment & Plan Note (Signed)
Sleep Study 03/04/2017 as is already scheduled per cards. CPAP as  indicated per cards evaluation of sleep study.   Continue on CPAP at bedtime. You appear to be benefiting from the treatment Goal is to wear for at least 6 hours each night for maximal clinical benefit. Continue to work on weight loss, as the link between excess weight  and sleep apnea is well established.  Do not drive if sleepy. Remember to clean mask, tubing, filter, and reservoir once weekly with soapy water.

## 2017-02-03 NOTE — Assessment & Plan Note (Addendum)
Recent admission for acute on chronic diastolic heart failure Diuresed for approximately 18 pound weight loss Plan Continue Lasix as ordered by cardiology Follow-up cardiology Continue to weigh yourself daily We will place a request for a POC of choice that will meet your oxygen needs. Sleep Study 03/04/2017 as is already scheduled per cards. Continue wearing your oxygen at 2.5 L at rest and 3L with exertion. Maintain oxygen sats at 88-92% Follow up with Dr. Lake [name] in 6 months. Please contact office for sooner follow up if symptoms do not improve or worsen or seek emergency care

## 2017-02-04 DIAGNOSIS — G510 Bell's palsy: Secondary | ICD-10-CM | POA: Diagnosis not present

## 2017-02-04 LAB — CBC WITH DIFFERENTIAL/PLATELET
BASOS ABS: 0 10*3/uL (ref 0.0–0.1)
BASOS PCT: 0 %
EOS ABS: 0.3 10*3/uL (ref 0.0–0.7)
Eosinophils Relative: 3 %
HCT: 51.6 % — ABNORMAL HIGH (ref 36.0–46.0)
Hemoglobin: 16.5 g/dL — ABNORMAL HIGH (ref 12.0–15.0)
Lymphocytes Relative: 13 %
Lymphs Abs: 1.3 10*3/uL (ref 0.7–4.0)
MCH: 31 pg (ref 26.0–34.0)
MCHC: 32 g/dL (ref 30.0–36.0)
MCV: 96.8 fL (ref 78.0–100.0)
MONO ABS: 1.2 10*3/uL — AB (ref 0.1–1.0)
MONOS PCT: 12 %
NEUTROS ABS: 7.2 10*3/uL (ref 1.7–7.7)
Neutrophils Relative %: 72 %
Platelets: 180 10*3/uL (ref 150–400)
RBC: 5.33 MIL/uL — ABNORMAL HIGH (ref 3.87–5.11)
RDW: 17 % — AB (ref 11.5–15.5)
WBC: 10 10*3/uL (ref 4.0–10.5)

## 2017-02-04 LAB — PROTIME-INR
INR: 2.48
PROTHROMBIN TIME: 26.7 s — AB (ref 11.4–15.2)

## 2017-02-04 LAB — BASIC METABOLIC PANEL
ANION GAP: 11 (ref 5–15)
BUN: 33 mg/dL — ABNORMAL HIGH (ref 6–20)
CALCIUM: 10.2 mg/dL (ref 8.9–10.3)
CO2: 29 mmol/L (ref 22–32)
CREATININE: 1.61 mg/dL — AB (ref 0.44–1.00)
Chloride: 98 mmol/L — ABNORMAL LOW (ref 101–111)
GFR, EST AFRICAN AMERICAN: 40 mL/min — AB (ref >60)
GFR, EST NON AFRICAN AMERICAN: 34 mL/min — AB (ref >60)
Glucose, Bld: 96 mg/dL (ref 65–99)
Potassium: 4.1 mmol/L (ref 3.5–5.1)
Sodium: 138 mmol/L (ref 135–145)

## 2017-02-04 MED ORDER — PREDNISONE 50 MG PO TABS: ORAL_TABLET | ORAL | 0 refills | Status: DC

## 2017-02-04 MED ORDER — AMOXICILLIN 500 MG PO CAPS: 500.0000 mg | ORAL_CAPSULE | Freq: Two times a day (BID) | ORAL | 0 refills | Status: DC

## 2017-02-04 MED ORDER — HYPROMELLOSE (GONIOSCOPIC) 2.5 % OP SOLN: 1.0000 [drp] | Freq: Three times a day (TID) | OPHTHALMIC | 12 refills | Status: DC | PRN

## 2017-02-04 MED ORDER — PREDNISONE 20 MG PO TABS
60.0000 mg | ORAL_TABLET | Freq: Once | ORAL | Status: AC
  Administered 2017-02-04: 60 mg via ORAL
  Filled 2017-02-04: qty 3

## 2017-02-04 NOTE — ED Provider Notes (Signed)
[facility] DEPT Provider Note   CSN: [id number] Arrival date & time: 02/03/17  1950     History   Chief Complaint Chief Complaint  Patient presents with  . Numbness    HPI [name] is a 56 y.o. female.  The history is provided by the patient.  Weakness  Primary symptoms include focal weakness, speech change.  Primary symptoms include no loss of balance, no dizziness. This is a new problem. The current episode started 6 to 12 hours ago. The problem has been gradually worsening. There was right facial focality noted. There has been no fever. Pertinent negatives include no shortness of breath, no chest pain, no vomiting, no altered mental status, no confusion and no headaches.  Patient with h/o chronic lung disease, aortic stenosis, on coumadin, here with right facial weakness She also reports weakness in forehead Also reports taste disturbance as well Also reports ear pain but no acute hearing changes No arm/leg weakness No dizziness No h/o stroke Seen at urgent care and sent for evaluation No tick bites/rash   Past Medical History:  Diagnosis Date  . Allergic rhinitis   . Anemia   . Anxiety   . Aortic stenosis   . Arthritis    "lower back" (11/30/2016)  . CAO (chronic airflow obstruction) (HCC)   . Cellulitis of left lower extremity 11/30/2016  . Depressive disorder   . Gout   . History of blood transfusion 03/2016   "I was anemic"  . HTN (hypertension)   . Hyperlipidemia   . Hypertriglyceridemia   . Kidney disease    Dr. Graylon Gunning  . Menopausal symptoms   . Mitral and aortic heart valve diseases, unspecified 07/2014   s/p AVR with #19 St Jude and s/p MVR with 51mm St. Jude per [name] at [facility]  . Mitral stenosis   . Morbid obesity ([place])   . Noninfectious lymphedema   . On home oxygen therapy    "2-3L when I'm up doing a whole lot" (11/30/2016)  . Renal insufficiency   . Shortness of breath    with exertion  . Sleep apnea   . Undiagnosed cardiac  murmurs   . Vitamin D deficiency     Patient Active Problem List   Diagnosis Date Noted  . Obstructive sleep apnea 02/03/2017  . Acute diastolic heart failure ([place]) 01/11/2017  . Cellulitis 11/30/2016  . Chronic respiratory failure ([place]) 08/12/2015  . 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 Dyspnea 07/22/2015  . Allergic rhinitis 05/02/2015  . Exertional dyspnea   . Symptomatic anemia 04/08/2015  . History of mitral valve replacement with mechanical valve 04/08/2015  . CAP (community acquired pneumonia) 04/08/2015  . CKD (chronic kidney disease) stage 3, GFR 30-59 ml/min 04/08/2015  . Atrial flutter, unspecified   . ILD (interstitial lung disease) ([place]) 10/12/2013  . Aortic stenosis 10/02/2013  . Essential hypertension 10/02/2013  . Hyperlipidemia 10/02/2013  . Chronic asthmatic bronchitis ([place]) 08/06/2013  . Abnormal CT scan, chest 08/09/2012    Past Surgical History:  Procedure Laterality Date  . AORTIC AND MITRAL VALVE REPLACEMENT  07/2014   s/p AVR with #19 St Jude and s/p MVR with 57mm St. Jude per [name] at [facility]  . CARDIAC CATHETERIZATION  07/2014  . CARDIAC VALVE REPLACEMENT    . CARDIOVERSION N/A 09/19/2014   Procedure: CARDIOVERSION;  Surgeon: Sanda Klein, MD;  Location: [facility];  Service: Cardiovascular;  Laterality: N/A;  . [place]  . LUNG BIOPSY Left 10/03/2013  Procedure: Left Lung Biopsy;  Surgeon: Melrose Nakayama, MD;  Location: [facility];  Service: Thoracic;  Laterality: Left;  . TONSILLECTOMY    . VIDEO ASSISTED THORACOSCOPY Left 10/03/2013   Procedure: Left Video Assited Thoracoscopy;  Surgeon: Melrose Nakayama, MD;  Location: [facility];  Service: Thoracic;  Laterality: Left;  Marland Kitchen VIDEO BRONCHOSCOPY Bilateral 10/25/2012   Procedure: VIDEO BRONCHOSCOPY WITH FLUORO;  Surgeon: Kathee Delton, MD;  Location: [facility];  Service: Cardiopulmonary;  Laterality: Bilateral;    OB History    No data available       Home Medications    Prior  to Admission medications   Medication Sig Start Date End Date Taking? Authorizing Provider  acetaminophen (TYLENOL) 650 MG CR tablet Take 1,300 mg by mouth every 8 (eight) hours as needed for pain.    Yes [provider]  albuterol (PROVENTIL HFA;VENTOLIN HFA) 108 (90 Base) MCG/ACT inhaler Inhale 2 puffs into the lungs 2 (two) times daily as needed for wheezing or shortness of breath. Reported on 06/20/2015 09/26/15  Yes Juanito Doom, MD  albuterol (PROVENTIL) (2.5 MG/3ML) 0.083% nebulizer solution Inhale 3 mLs (2.5 mg total) into the lungs every 4 (four) hours as needed. Patient taking differently: Inhale 2.5 mg into the lungs every 4 (four) hours as needed for wheezing or shortness of breath.  09/26/15 02/04/17 Yes Juanito Doom, MD  allopurinol (ZYLOPRIM) 300 MG tablet Take 300 mg by mouth at bedtime.    Yes [provider]  aspirin EC 81 MG tablet Take 81 mg by mouth daily.   Yes [provider]  budesonide-formoterol (SYMBICORT) 80-4.5 MCG/ACT inhaler Inhale 2 puffs into the lungs 2 (two) times daily. Reported on 06/20/2015 07/25/15 02/04/17 Yes Juanito Doom, MD  buPROPion (WELLBUTRIN XL) 300 MG 24 hr tablet Take 300 mg by mouth daily.  08/01/12  Yes [provider]  cetirizine (ZYRTEC) 10 MG tablet Take 1 tablet (10 mg total) by mouth daily. Patient taking differently: Take 10 mg by mouth at bedtime.  06/20/15  Yes Magdalen Spatz, NP  Cholecalciferol (VITAMIN D3) 2000 UNITS TABS Take 2,000 Units by mouth daily.    Yes [provider]  citalopram (CELEXA) 20 MG tablet Take 20 mg by mouth daily.  08/01/12  Yes [provider]  furosemide (LASIX) 40 MG tablet Take 3 tablets (120 mg total) by mouth 2 (two) times daily. 01/13/17  Yes Daune Perch, NP  losartan (COZAAR) 50 MG tablet Take 1 tablet (50 mg total) by mouth daily. 07/27/16 02/04/17 Yes [name], NP  metoprolol (LOPRESSOR) 50 MG tablet Take 50 mg by mouth 2 (two) times daily.    Yes [provider]  montelukast (SINGULAIR) 10 MG tablet Take 10 mg by mouth daily.    Yes [provider]  OXYGEN Inhale 2.5-3 L into the lungs continuous.    Yes [provider]  potassium chloride SA (K-DUR,KLOR-CON) 20 MEQ tablet Take 20 mEq by mouth daily.   Yes [provider]  pravastatin (PRAVACHOL) 80 MG tablet Take 80 mg by mouth daily.  08/01/12  Yes [provider]  spironolactone (ALDACTONE) 25 MG tablet Take 1 tablet (25 mg total) by mouth daily. 01/14/17  Yes Daune Perch, NP  triamcinolone (NASACORT AQ) 55 MCG/ACT AERO nasal inhaler Place 2 sprays into the nose daily. Patient taking differently: Place 2 sprays into the nose daily as needed (for seasonal allergies).  06/20/15  Yes Magdalen Spatz, NP  warfarin (COUMADIN) 7.5 MG tablet TAKE 1 TABLET BY MOUTH DAILY OR AS DIRECTED BY COUMADIN CLINIC Patient taking differently: Take 3.75-7.5 mg by mouth See admin instructions. Take 7.5 mg by mouth at bedtime on Sun/Tues/Thurs and take 3.75 mg by mouth at bedtime on Mon/Wed/Fri/Sat 06/15/16  Yes [name], NP    Family History Family History  Problem Relation Age of Onset  . Emphysema Mother   . Cancer Mother        throat  . Hypertension Mother   . Dementia Mother   . Heart disease Father        valve replacement  . Kidney disease Father   . Hypertension Father   . Kidney failure Father        dialysis  . Hypertension Sister   . Hypertension Brother   . Diabetes Brother   . Stroke Brother   . Heart attack Neg Hx     Social History Social History  Substance Use Topics  . Smoking status: Former Smoker    Packs/day: 1.00    Years: 35.00    Types: Cigarettes    Quit date: 10/03/2013  . Smokeless tobacco: Never Used  . Alcohol use No     Allergies   Patient has no known allergies.   Review of Systems Review of Systems  Constitutional: Negative for fever.  Eyes: Positive for visual disturbance.       Blurred  vision due to unable to close right eye   Respiratory: Negative for shortness of breath.   Cardiovascular: Negative for chest pain.  Gastrointestinal: Negative for vomiting.  Neurological: Positive for speech change, focal weakness and weakness. Negative for dizziness, headaches and loss of balance.  Psychiatric/Behavioral: Negative for confusion.  All other systems reviewed and are negative.    Physical Exam Updated Vital Signs BP 118/60 (BP Location: Right Arm)   Pulse 71   Temp 98.5 F (36.9 C) (Oral)   Resp 16   SpO2 90%   Physical Exam  CONSTITUTIONAL: Well developed/well nourished HEAD: Normocephalic/atraumatic EYES: EOMI/PERRL, unable to keep right eye closed during exam ENMT: Mucous membranes moist, left TM clear/intact, right TM with erythema but intact.  No vesicles to ear.  No rash to face.  No tenderness/swelling to mastoid.   NECK: supple no meningeal signs, no lymphadenopathy SPINE/BACK:entire spine nontender CV: S1/S2 noted, murmur noted LUNGS: Lungs are clear to auscultation bilaterally, no apparent distress ABDOMEN: soft, nontender, no rebound or guarding, bowel sounds noted throughout abdomen GU:no cva tenderness NEURO: Pt is awake/alert/appropriate Right facial droop noted.  She reports numbness to right face.  Tongue midline All other cranial nerves intact No arm/leg drift No past pointing EXTREMITIES: pulses normal/equal, full ROM, chronic lymphedema to right LE SKIN: warm, color normal PSYCH: no abnormalities of mood noted, alert and oriented to situation  Patient gave verbal permission to utilize photo for medical documentation only The image was not stored on any personal device    ED Treatments / Results  Labs (all labs ordered are listed, but only abnormal results are displayed) Labs Reviewed  BASIC METABOLIC PANEL - Abnormal; Notable for the following:       Result Value   Chloride 98 (*)    BUN 33 (*)    Creatinine, Ser 1.61 (*)     GFR calc non Af Amer 34 (*)    GFR calc Af Amer 40 (*)    All other components within normal limits  CBC WITH DIFFERENTIAL/PLATELET - Abnormal; Notable  for the following:    RBC 5.33 (*)    Hemoglobin 16.5 (*)    HCT 51.6 (*)    RDW 17.0 (*)    Monocytes Absolute 1.2 (*)    All other components within normal limits  PROTIME-INR - Abnormal; Notable for the following:    Prothrombin Time 26.7 (*)    All other components within normal limits    EKG  EKG Interpretation None       Radiology No results found.  Procedures Procedures  Medications Ordered in ED Medications  predniSONE (DELTASONE) tablet 60 mg (60 mg Oral Given 02/04/17 0158)     Initial Impression / Assessment and Plan / ED Course  I have reviewed the triage vital signs and the nursing notes.  Pertinent labs   results that were available during my care of the patient were reviewed by me and considered in my medical decision making (see chart for details).     1:07 AM Pt with bells palsy Will start prednisone Will check labs  However I don't feel neuroimaging required Will start oral antibiotic for concern for possible OM associated bells No signs of [place] No h/o tick bites No signs of stroke on exam  At time of discharge: Pt stable No new complaints We discussed neuro followup We discussed return precautions Discussed need for appropriate eye care, artifical tears given and discussed need for eye patch at night  She was informed of coumadin level, goal 2.5 due to valve Final Clinical Impressions(s) / ED Diagnoses   Final diagnoses:  Bell's palsy  Acute suppurative otitis media of right ear without spontaneous rupture of tympanic membrane, recurrence not specified    New Prescriptions Discharge Medication List as of 02/04/2017  3:09 AM    START taking these medications   Details  amoxicillin (AMOXIL) 500 MG capsule Take 1 capsule (500 mg total) by mouth 2 (two) times daily., Starting Fri  02/04/2017, Print    hydroxypropyl methylcellulose / hypromellose (ISOPTO TEARS / GONIOVISC) 2.5 % ophthalmic solution Place 1 drop into the right eye 3 (three) times daily as needed for dry eyes., Starting Fri 02/04/2017, Print    predniSONE (DELTASONE) 50 MG tablet One tablet PO daily for 6 days, Print         Ripley Fraise, MD 02/04/17 0401

## 2017-02-04 NOTE — Progress Notes (Signed)
Reviewed, agree 

## 2017-02-07 ENCOUNTER — Encounter: Payer: Self-pay | Admitting: Diagnostic Neuroimaging

## 2017-02-07 ENCOUNTER — Ambulatory Visit (INDEPENDENT_AMBULATORY_CARE_PROVIDER_SITE_OTHER): Payer: Medicare Other | Admitting: Diagnostic Neuroimaging

## 2017-02-07 ENCOUNTER — Ambulatory Visit (INDEPENDENT_AMBULATORY_CARE_PROVIDER_SITE_OTHER): Payer: Medicare Other | Admitting: *Deleted

## 2017-02-07 VITALS — BP 154/86 | HR 59 | Ht 66.5 in | Wt 320.6 lb

## 2017-02-07 DIAGNOSIS — H6691 Otitis media, unspecified, right ear: Secondary | ICD-10-CM

## 2017-02-07 DIAGNOSIS — I350 Nonrheumatic aortic (valve) stenosis: Secondary | ICD-10-CM | POA: Diagnosis not present

## 2017-02-07 DIAGNOSIS — Z5181 Encounter for therapeutic drug level monitoring: Secondary | ICD-10-CM

## 2017-02-07 DIAGNOSIS — G510 Bell's palsy: Secondary | ICD-10-CM

## 2017-02-07 DIAGNOSIS — Z952 Presence of prosthetic heart valve: Secondary | ICD-10-CM | POA: Diagnosis not present

## 2017-02-07 HISTORY — DX: Bell's palsy: G51.0

## 2017-02-07 LAB — POCT INR: INR: 1.8

## 2017-02-07 NOTE — Progress Notes (Signed)
[facility]  PATIENT: [name] DOB: 06/24/1960  REFERRING CLINICIAN: ER  HISTORY FROM: patient  REASON FOR VISIT: new consult    HISTORICAL  CHIEF COMPLAINT:  Chief Complaint  Patient presents with  . NP  ER Referral  . [name] Palsy    R sided. On Prednisone and Amoxil     HISTORY OF PRESENT ILLNESS:   56 year old female here for evaluation of Bell's palsy. Patient has history of aortic and mitral valve mechanical replacements. Hypertension, hyperlipidemia, kidney disease, obesity, home oxygen, anxiety.  02/01/17 patient noticed some neck pain bilaterally.  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 Patient went to urgent care for evaluation and was referred to emergency room. Patient was diagnosed with Bell's palsy and started on prednisone. She was also diagnosed with right-sided otitis media, which is felt to be related to her Bell's palsy. She was discharged with antibiotics.  Since that time symptoms have continued. Patient does report some change in sensation of taste. She is using artificial tears for eye protection.  No new symptoms in her extremities. No headaches. She was spending time with her granddaughter recently who had some type of viral infection.    REVIEW OF SYSTEMS: Full 14 system review of systems performed and negative with exception of: Blurred vision.  ALLERGIES: No Known Allergies  HOME MEDICATIONS: Outpatient Medications Prior to Visit  Medication Sig Dispense Refill  . acetaminophen (TYLENOL) 650 MG CR tablet Take 1,300 mg by mouth every 8 (eight) hours as needed for pain.     Marland Kitchen albuterol (PROVENTIL HFA;VENTOLIN HFA) 108 (90 Base) MCG/ACT inhaler Inhale 2 puffs into the lungs 2 (two) times daily as needed for wheezing or shortness of breath. Reported on 06/20/2015 3.7 g 5  . allopurinol (ZYLOPRIM) 300 MG tablet Take 300 mg by mouth at bedtime.     Marland Kitchen  amoxicillin (AMOXIL) 500 MG capsule Take 1 capsule (500 mg total) by mouth 2 (two) times daily. 14 capsule 0  . aspirin EC 81 MG tablet Take 81 mg by mouth daily.    Marland Kitchen buPROPion (WELLBUTRIN XL) 300 MG 24 hr tablet Take 300 mg by mouth daily.     . cetirizine (ZYRTEC) 10 MG tablet Take 1 tablet (10 mg total) by mouth daily. (Patient taking differently: Take 10 mg by mouth at bedtime. ) 30 tablet 5  . Cholecalciferol (VITAMIN D3) 2000 UNITS TABS Take 2,000 Units by mouth daily.     . citalopram (CELEXA) 20 MG tablet Take 20 mg by mouth daily.     . furosemide (LASIX) 40 MG tablet Take 3 tablets (120 mg total) by mouth 2 (two) times daily. 180 tablet 0  . hydroxypropyl methylcellulose / hypromellose (ISOPTO TEARS / GONIOVISC) 2.5 % ophthalmic solution Place 1 drop into the right eye 3 (three) times daily as needed for dry eyes. 15 mL 12  . metoprolol (LOPRESSOR) 50 MG tablet Take 50 mg by mouth 2 (two) times daily.    . montelukast (SINGULAIR) 10 MG tablet Take 10 mg by mouth daily.     . OXYGEN Inhale 2.5-3 L into the lungs continuous.     . potassium chloride SA (K-DUR,KLOR-CON) 20 MEQ tablet Take 20 mEq by mouth daily.    . pravastatin (PRAVACHOL) 80 MG tablet Take 80 mg by mouth daily.     . predniSONE (DELTASONE) 50 MG tablet One tablet PO daily for 6 days 6 tablet 0  .  spironolactone (ALDACTONE) 25 MG tablet Take 1 tablet (25 mg total) by mouth daily. 30 tablet 5  . triamcinolone (NASACORT AQ) 55 MCG/ACT AERO nasal inhaler Place 2 sprays into the nose daily. (Patient taking differently: Place 2 sprays into the nose daily as needed (for seasonal allergies). ) 1 Inhaler 5  . warfarin (COUMADIN) 7.5 MG tablet TAKE 1 TABLET BY MOUTH DAILY OR AS DIRECTED BY COUMADIN CLINIC (Patient taking differently: Take 3.75-7.5 mg by mouth See admin instructions. Take 7.5 mg by mouth at bedtime on Sun/Tues/Thurs and take 3.75 mg by mouth at bedtime on Mon/Wed/Fri/Sat) 90 tablet 0  . albuterol (PROVENTIL) (2.5  MG/3ML) 0.083% nebulizer solution Inhale 3 mLs (2.5 mg total) into the lungs every 4 (four) hours as needed. (Patient taking differently: Inhale 2.5 mg into the lungs every 4 (four) hours as needed for wheezing or shortness of breath. ) 150 mL 5  . budesonide-formoterol (SYMBICORT) 80-4.5 MCG/ACT inhaler Inhale 2 puffs into the lungs 2 (two) times daily. Reported on 06/20/2015 1 Inhaler 11  . losartan (COZAAR) 50 MG tablet Take 1 tablet (50 mg total) by mouth daily. 90 tablet 3   No facility-administered medications prior to visit.     PAST MEDICAL HISTORY: Past Medical History:  Diagnosis Date  . Allergic rhinitis   . Anemia   . Anxiety   . Aortic stenosis   . Arthritis    "lower back" (11/30/2016)  . CAO (chronic airflow obstruction) (HCC)   . Cellulitis of left lower extremity 11/30/2016  . Depressive disorder   . Gout   . History of blood transfusion 03/2016   "I was anemic"  . HTN (hypertension)   . Hyperlipidemia   . Hypertriglyceridemia   . Kidney disease    Dr. Graylon Gunning  . Menopausal symptoms   . Mitral and aortic heart valve diseases, unspecified 07/2014   s/p AVR with #19 St Jude and s/p MVR with 32mm St. Jude per [name] at [facility]  . Mitral stenosis   . Morbid obesity ([place])   . Noninfectious lymphedema   . On home oxygen therapy    "2-3L when I'm up doing a whole lot" (11/30/2016)  . Renal insufficiency   . Shortness of breath    with exertion  . Sleep apnea   . Undiagnosed cardiac murmurs   . Vitamin D deficiency     PAST SURGICAL HISTORY: Past Surgical History:  Procedure Laterality Date  . AORTIC AND MITRAL VALVE REPLACEMENT  07/2014   s/p AVR with #19 St Jude and s/p MVR with 83mm St. Jude per [name] at [facility]  . CARDIAC CATHETERIZATION  07/2014  . CARDIAC VALVE REPLACEMENT    . CARDIOVERSION N/A 09/19/2014   Procedure: CARDIOVERSION;  Surgeon: Sanda Klein, MD;  Location: [place];  Service: Cardiovascular;  Laterality: N/A;  . [place]  . LUNG BIOPSY Left 10/03/2013   Procedure: Left Lung Biopsy;  Surgeon: Melrose Nakayama, MD;  Location: [place];  Service: Thoracic;  Laterality: Left;  . TONSILLECTOMY    . VIDEO ASSISTED THORACOSCOPY Left 10/03/2013   Procedure: Left Video Assited Thoracoscopy;  Surgeon: Melrose Nakayama, MD;  Location: [place];  Service: Thoracic;  Laterality: Left;  Marland Kitchen VIDEO BRONCHOSCOPY Bilateral 10/25/2012   Procedure: VIDEO BRONCHOSCOPY WITH FLUORO;  Surgeon: Kathee Delton, MD;  [facility]: [facility];  Service: Cardiopulmonary;  Laterality: Bilateral;    FAMILY HISTORY: Family History  Problem Relation Age of Onset  . Emphysema  Mother   . Cancer Mother        throat  . Hypertension Mother   . Dementia Mother   . Heart disease Father        valve replacement  . Kidney disease Father   . Hypertension Father   . Kidney failure Father        dialysis  . Hypertension Sister   . Hypertension Brother   . Diabetes Brother   . Stroke Brother   . Heart attack Neg Hx     SOCIAL HISTORY:  Social History   Social History  . Marital status: Widowed    Spouse name: N/A  . Number of children: 1  . Years of education: N/A   Occupational History  . disabled    Social History Main Topics  . Smoking status: Former Smoker    Packs/day: 1.00    Years: 35.00    Types: Cigarettes    Quit date: 10/03/2013  . Smokeless tobacco: Never Used  . Alcohol use No  . Drug use: No  . Sexual activity: No   Other Topics Concern  . Not on file   Social History Narrative   Lives at home with sister.  Pt is singles, disabled,  Has HS diploma.       PHYSICAL EXAM  GENERAL EXAM/CONSTITUTIONAL: Vitals:  Vitals:   02/07/17 0758  BP: (!) 154/86  Pulse: (!) 59  Weight: (!) 320 lb 9.6 oz (145.4 kg)  Height: 5' 6.5" (1.689 m)     Body mass index is 50.97 kg/m.  Visual Acuity Screening   Right eye Left eye Both eyes  Without correction: 20/100 20/100   With correction:        Patient is  in no distress; well developed, nourished and groomed; neck is supple  OTOSCOPIC EVALUATION --> TM FLAT; NO ERYTHEMA; NORMAL LIGHT REFLEX NOTED  CARDIOVASCULAR:  Examination of carotid arteries is normal; no carotid bruits  Regular rate and rhythm; MECHANICAL CLICK; NO MURMUR  Examination of peripheral vascular system by observation and palpation is normal  ON OXYGEN VIA [place]  EYES:  Ophthalmoscopic exam of optic discs and posterior segments is normal; no papilledema or hemorrhages  MUSCULOSKELETAL:  Gait, strength, tone, movements noted in Neurologic exam below  NEUROLOGIC: MENTAL STATUS:  No flowsheet data found.  awake, alert, oriented to person, place and time  recent and remote memory intact  normal attention and concentration  language fluent, comprehension intact, naming intact,   fund of knowledge appropriate  CRANIAL NERVE:   2nd - no papilledema on fundoscopic exam  2nd, 3rd, 4th, 6th - pupils equal and reactive to light, visual fields full to confrontation, extraocular muscles intact, no nystagmus  5th - facial sensation symmetric  7th - facial strength --> AT REST RIGHT UPPER AND LOWER FACIAL WEAKNESS; ON EXERTION, BARELY VISIBLE MOVEMENT IN RIGHT FOREHEAD; SLIGHT MOVEMENT IN RIGHT LOWER FACE ON EXERTION  8th - hearing intact  9th - palate elevates symmetrically, uvula midline  11th - shoulder shrug symmetric  12th - tongue protrusion midline  MOTOR:   normal bulk and tone, full strength in the BUE, BLE  SENSORY:   normal and symmetric to light touch, temperature, vibration  COORDINATION:   finger-nose-finger, fine finger movements normal  REFLEXES:   deep tendon reflexes present and symmetric  GAIT/STATION:   narrow based gait    DIAGNOSTIC DATA (LABS, IMAGING, TESTING) - I reviewed patient records, labs, notes, testing and imaging myself where available.  Lab Results  Component Value Date   WBC 10.0 02/04/2017   HGB 16.5  (H) 02/04/2017   HCT 51.6 (H) 02/04/2017   MCV 96.8 02/04/2017   PLT 180 02/04/2017      Component Value Date/Time   NA 138 02/04/2017 0208   NA 138 01/28/2017 1157   K 4.1 02/04/2017 0208   CL 98 (L) 02/04/2017 0208   CO2 29 02/04/2017 0208   GLUCOSE 96 02/04/2017 0208   BUN 33 (H) 02/04/2017 0208   BUN 25 (H) 01/28/2017 1157   CREATININE 1.61 (H) 02/04/2017 0208   CREATININE 1.42 (H) 04/07/2015 1249   CALCIUM 10.2 02/04/2017 0208   PROT 7.6 01/10/2017 1520   ALBUMIN 4.4 01/10/2017 1520   AST 24 01/10/2017 1520   ALT 17 01/10/2017 1520   ALKPHOS 74 01/10/2017 1520   BILITOT 0.7 01/10/2017 1520   GFRNONAA 34 (L) 02/04/2017 0208   GFRAA 40 (L) 02/04/2017 0208   Lab Results  Component Value Date   CHOL 140 01/13/2017   HDL 43 01/13/2017   LDLCALC 69 01/13/2017   TRIG 138 01/13/2017   CHOLHDL 3.3 01/13/2017   No results found for: HGBA1C No results found for: VITAMINB12 Lab Results  Component Value Date   TSH 3.57 09/18/2014        ASSESSMENT AND PLAN  56 y.o. year old female here with new onset right facial weakness (upper and lower) with associated otitis media on the right side.   Dx: right facial nerve palsy (viral vs otitis media associated)  1. Right-sided Bell's palsy   2. Right otitis media, unspecified otitis media type      PLAN: - continue prednisone and amoxicillin - continue eye care (artificial tears); follow up with ophthalmology - monitor symptoms  Return in about 6 weeks (around 03/21/2017).    Penni Bombard, MD 4/65/6812, 7:51 AM Certified in Neurology, Neurophysiology and Neuroimaging  [facility] [address] [place], [place] [address] [phone]

## 2017-02-08 ENCOUNTER — Telehealth: Payer: Self-pay | Admitting: Diagnostic Neuroimaging

## 2017-02-08 NOTE — Telephone Encounter (Signed)
Pt states that Dr Leta Baptist wants to see her in 6 weeks but 1st available is 01-14, pt is asking to be called if there is anyway for her to be seen within that time frame please call

## 2017-02-09 NOTE — Telephone Encounter (Signed)
Ok to use work in [employer]. -VRP

## 2017-02-15 ENCOUNTER — Ambulatory Visit: Payer: Medicare Other | Admitting: Nurse Practitioner

## 2017-02-15 DIAGNOSIS — D751 Secondary polycythemia: Secondary | ICD-10-CM | POA: Diagnosis not present

## 2017-02-22 ENCOUNTER — Ambulatory Visit (INDEPENDENT_AMBULATORY_CARE_PROVIDER_SITE_OTHER): Payer: Medicare Other | Admitting: Pharmacist

## 2017-02-22 ENCOUNTER — Ambulatory Visit (INDEPENDENT_AMBULATORY_CARE_PROVIDER_SITE_OTHER): Payer: Medicare Other | Admitting: Nurse Practitioner

## 2017-02-22 ENCOUNTER — Encounter: Payer: Self-pay | Admitting: Nurse Practitioner

## 2017-02-22 VITALS — BP 126/70 | Ht 66.5 in | Wt 324.8 lb

## 2017-02-22 DIAGNOSIS — J849 Interstitial pulmonary disease, unspecified: Secondary | ICD-10-CM | POA: Diagnosis not present

## 2017-02-22 DIAGNOSIS — Z952 Presence of prosthetic heart valve: Secondary | ICD-10-CM | POA: Diagnosis not present

## 2017-02-22 DIAGNOSIS — I350 Nonrheumatic aortic (valve) stenosis: Secondary | ICD-10-CM | POA: Diagnosis not present

## 2017-02-22 DIAGNOSIS — I4892 Unspecified atrial flutter: Secondary | ICD-10-CM

## 2017-02-22 DIAGNOSIS — R0602 Shortness of breath: Secondary | ICD-10-CM

## 2017-02-22 DIAGNOSIS — Z7901 Long term (current) use of anticoagulants: Secondary | ICD-10-CM

## 2017-02-22 DIAGNOSIS — I5032 Chronic diastolic (congestive) heart failure: Secondary | ICD-10-CM

## 2017-02-22 LAB — BASIC METABOLIC PANEL
BUN/Creatinine Ratio: 17 (ref 9–23)
BUN: 23 mg/dL (ref 6–24)
CO2: 26 mmol/L (ref 20–29)
Calcium: 10 mg/dL (ref 8.7–10.2)
Chloride: 96 mmol/L (ref 96–106)
Creatinine, Ser: 1.39 mg/dL — ABNORMAL HIGH (ref 0.57–1.00)
GFR calc Af Amer: 49 mL/min/{1.73_m2} — ABNORMAL LOW (ref >59)
GFR calc non Af Amer: 42 mL/min/{1.73_m2} — ABNORMAL LOW (ref >59)
Glucose: 112 mg/dL — ABNORMAL HIGH (ref 65–99)
Potassium: 4.4 mmol/L (ref 3.5–5.2)
Sodium: 140 mmol/L (ref 134–144)

## 2017-02-22 LAB — POCT INR: INR: 4

## 2017-02-22 MED ORDER — FUROSEMIDE 40 MG PO TABS: 120.0000 mg | ORAL_TABLET | Freq: Every day | ORAL | 3 refills | Status: DC

## 2017-02-22 NOTE — Progress Notes (Signed)
CARDIOLOGY OFFICE NOTE  Date:  02/22/2017    [name] Date of Birth: 10/30/1960 Medical Record #[id number]  PCP:  [name], [place]  Cardiologist:  [name]    Chief Complaint  Patient presents with  . Congestive Heart Failure    Follow up visit - seen for [name]    History of Present Illness: [name] is a 56 y.o. female who presents today for a one month check. Seen for [name].   She has a hx of valvular heart disease, idiopathic pulmonary hemosiderosis, ILD, polycythemia - requiring phlebotomies, CKD, HTN, HLD, &OSA. She underwent mechanical MVR and mechanical AVR with [name] at [facility] 07/2014. From CareEverywhere: "Cath and echo showed severe aortic stenosis, moderate mitral stenosis, PHBP, very small LV cavity and small aortic root with preserved ventricular function and no significant coronary disease. The patient was taken to the operating room by [name], on 08/06/2014 where she underwent Aortic valve replacement 28mm StJude Regent valve, mitral valve replacement 21mm St Jude mechanical valve finding rheumatic disease."      Post op she was noted to be in AFlutter. Rate was uncontrolled. Amiodarone dose was increased as well as the beta-blocker. She has had tolerance issues with amiodarone but was subsequently cardioverted and was able to have her amiodarone stopped in July of 2016 - noted that she would need different AAD if needed for recurrent arrhythmia. She underwent cardioversion on 4/21 - the dictated note says the post EKG was atrial flutter however the EKG post cardioversion showed NSR. We brought her back for an early post cardioversion visit to recheck/verify her EKG - she was in NSR. She was able to have her Midodrine stopped that she had been put on during her surgery.               Back in November of 2016 - I saw her - she was short of breath - I got a CBC - Horribly anemic - I sent her to GI for  colonoscopy and she had to be transfused as well.   Last seen by me back last August of 2017 - she was doing ok. Rarely using oxygen at that time. Continued to see Dr. Lake [name]. I saw her in February of 2018 and she was doing ok. BP running up and suspected this was due to excess salt - eating Roman noodles for almost every meal due to cost. Echo updated back in July and reviewed with [name] - her valves are noted to be undersized.   I then saw her back in August - lots of swelling. Weight was up - she had had cellulitis and had been admitted previously. Ended up re-admitting for IV diuresis. Was diuresed down 9 pounds. Sent home on 120 mg of Lasix BID. On return visit a month ago - she was on the incorrect dose of Lasix (actually on less) - she had improved clinically but weight was already climbing. She was starting to consider bariatric surgery. She has chronic lymphedema of the right leg from a remote injury.  Comes in today. Here alone. She has been back to the ER since her last visit with me due to having Bell's palsy - this is resolving - drooping is going away and she has less tenderness. She was treated with Prednisone. She feels better. Has her oxygen in place. She will be having a sleep study soon. She is going to the weight loss center -  she is proceeding on with the process for possible bariatric surgery. No chest pain. Her swelling has improved dramatically. Cellulitis has cleared. She is down 20 pounds over the past month. She feels like she is doing well. She has seen hematology at [facility] - Dr. [name] - holding on repeat phlebotomy but plans to recheck her in 3 months.   Past Medical History:  Diagnosis Date  . Allergic rhinitis   . Anemia   . Anxiety   . Aortic stenosis   . Arthritis    "lower back" (11/30/2016)  . CAO (chronic airflow obstruction) (HCC)   . Cellulitis of left lower extremity 11/30/2016  . Depressive disorder   . Gout   . History of blood transfusion  03/2016   "I was anemic"  . HTN (hypertension)   . Hyperlipidemia   . Hypertriglyceridemia   . Kidney disease    Dr. Graylon Gunning  . Menopausal symptoms   . Mitral and aortic heart valve diseases, unspecified 07/2014   s/p AVR with #19 St Jude and s/p MVR with 48mm St. Jude per [name] at [facility]  . Mitral stenosis   . Morbid obesity ([place])   . Noninfectious lymphedema   . On home oxygen therapy    "2-3L when I'm up doing a whole lot" (11/30/2016)  . Renal insufficiency   . Shortness of breath    with exertion  . Sleep apnea   . Undiagnosed cardiac murmurs   . Vitamin D deficiency     Past Surgical History:  Procedure Laterality Date  . AORTIC AND MITRAL VALVE REPLACEMENT  07/2014   s/p AVR with #19 St Jude and s/p MVR with 34mm St. Jude per [name] at [facility]  . CARDIAC CATHETERIZATION  07/2014  . CARDIAC VALVE REPLACEMENT    . CARDIOVERSION N/A 09/19/2014   Procedure: CARDIOVERSION;  Surgeon: Sanda Klein, MD;  Location: [place];  Service: Cardiovascular;  Laterality: N/A;  . [place]  . LUNG BIOPSY Left 10/03/2013   Procedure: Left Lung Biopsy;  Surgeon: Melrose Nakayama, MD;  Location: [place];  Service: Thoracic;  Laterality: Left;  . TONSILLECTOMY    . VIDEO ASSISTED THORACOSCOPY Left 10/03/2013   Procedure: Left Video Assited Thoracoscopy;  Surgeon: Melrose Nakayama, MD;  Location: [place];  Service: Thoracic;  Laterality: Left;  Marland Kitchen VIDEO BRONCHOSCOPY Bilateral 10/25/2012   Procedure: VIDEO BRONCHOSCOPY WITH FLUORO;  Surgeon: Kathee Delton, MD;  Location: [facility];  Service: Cardiopulmonary;  Laterality: Bilateral;     Medications: Current Meds  Medication Sig  . acetaminophen (TYLENOL) 650 MG CR tablet Take 1,300 mg by mouth every 8 (eight) hours as needed for pain.   Marland Kitchen albuterol (PROVENTIL HFA;VENTOLIN HFA) 108 (90 Base) MCG/ACT inhaler Inhale 2 puffs into the lungs 2 (two) times daily as needed for wheezing or shortness of breath. Reported on  06/20/2015  . allopurinol (ZYLOPRIM) 300 MG tablet Take 300 mg by mouth at bedtime.   Marland Kitchen amoxicillin (AMOXIL) 500 MG capsule Take 1 capsule (500 mg total) by mouth 2 (two) times daily.  Marland Kitchen aspirin EC 81 MG tablet Take 81 mg by mouth daily.  Marland Kitchen buPROPion (WELLBUTRIN XL) 300 MG 24 hr tablet Take 300 mg by mouth daily.   . cetirizine (ZYRTEC) 10 MG tablet Take 1 tablet (10 mg total) by mouth daily. (Patient taking differently: Take 10 mg by mouth at bedtime. )  . Cholecalciferol (VITAMIN D3) 2000 UNITS TABS Take 2,000 Units by mouth  daily.   . citalopram (CELEXA) 20 MG tablet Take 20 mg by mouth daily.   . hydroxypropyl methylcellulose / hypromellose (ISOPTO TEARS / GONIOVISC) 2.5 % ophthalmic solution Place 1 drop into the right eye 3 (three) times daily as needed for dry eyes.  . metoprolol (LOPRESSOR) 50 MG tablet Take 50 mg by mouth 2 (two) times daily.  . montelukast (SINGULAIR) 10 MG tablet Take 10 mg by mouth daily.   . OXYGEN Inhale 2.5-3 L into the lungs continuous.   . potassium chloride SA (K-DUR,KLOR-CON) 20 MEQ tablet Take 20 mEq by mouth daily.  . pravastatin (PRAVACHOL) 80 MG tablet Take 80 mg by mouth daily.   . predniSONE (DELTASONE) 50 MG tablet One tablet PO daily for 6 days  . spironolactone (ALDACTONE) 25 MG tablet Take 1 tablet (25 mg total) by mouth daily.  Marland Kitchen triamcinolone (NASACORT AQ) 55 MCG/ACT AERO nasal inhaler Place 2 sprays into the nose daily. (Patient taking differently: Place 2 sprays into the nose daily as needed (for seasonal allergies). )  . warfarin (COUMADIN) 7.5 MG tablet TAKE 1 TABLET BY MOUTH DAILY OR AS DIRECTED BY COUMADIN CLINIC (Patient taking differently: Take 3.75-7.5 mg by mouth See admin instructions. Take 7.5 mg by mouth at bedtime on Sun/Tues/Thurs and take 3.75 mg by mouth at bedtime on Mon/Wed/Fri/Sat)  . [DISCONTINUED] furosemide (LASIX) 40 MG tablet Take 3 tablets (120 mg total) by mouth 2 (two) times daily.     Allergies: No Known  Allergies  Social History: The patient  reports that she quit smoking about 3 years ago. Her smoking use included Cigarettes. She has a 35.00 pack-year smoking history. She has never used smokeless tobacco. She reports that she does not drink alcohol or use drugs.   Family History: The patient's family history includes Cancer in her mother; Dementia in her mother; Diabetes in her brother; Emphysema in her mother; Heart disease in her father; Hypertension in her brother, father, mother, and sister; Kidney disease in her father; Kidney failure in her father; Stroke in her brother.   Review of Systems: Please see the history of present illness.   Otherwise, the review of systems is positive for none.   All other systems are reviewed and negative.   Physical Exam: VS:  BP 126/70 (BP Location: Left Arm, Patient Position: Sitting, Cuff Size: Large)   Ht 5' 6.5" (1.689 m)   Wt (!) 324 lb 12.8 oz (147.3 kg)   SpO2 91% Comment: with 3 Liters  BMI 51.64 kg/m  .  BMI Body mass index is 51.64 kg/m.  Wt Readings from Last 3 Encounters:  02/22/17 (!) 324 lb 12.8 oz (147.3 kg)  02/07/17 (!) 320 lb 9.6 oz (145.4 kg)  02/03/17 (!) 328 lb 4 oz (148.9 kg)    General: Pleasant. Morbidly obese. Alert and in no acute distress.  She weighed 344 at last visit with me - now down 20 pounds.  HEENT: Normal.  Neck: Supple, no JVD, carotid bruits, or masses noted.  Cardiac: Regular rate and rhythm. Heart tones are quite distant. Less edema on the left with brawny stasis changes - chronic lymphedema on the right.  Respiratory:  Lungs are a little coarse - decreased breath sounds - but with normal work of breathing.  GI: Soft and nontender. Obese.  MS: No deformity or atrophy. Gait and ROM intact.  Skin: Warm and dry. Color is normal.  Neuro:  Strength and sensation are intact and no gross focal deficits noted.  Psych: Alert, appropriate and with normal affect.   LABORATORY DATA:  EKG:  EKG is not ordered  today.  Lab Results  Component Value Date   WBC 10.0 02/04/2017   HGB 16.5 (H) 02/04/2017   HCT 51.6 (H) 02/04/2017   PLT 180 02/04/2017   GLUCOSE 96 02/04/2017   CHOL 140 01/13/2017   TRIG 138 01/13/2017   HDL 43 01/13/2017   LDLCALC 69 01/13/2017   ALT 17 01/10/2017   AST 24 01/10/2017   NA 138 02/04/2017   K 4.1 02/04/2017   CL 98 (L) 02/04/2017   CREATININE 1.61 (H) 02/04/2017   BUN 33 (H) 02/04/2017   CO2 29 02/04/2017   TSH 3.57 09/18/2014   INR 4.0 02/22/2017     BNP (last 3 results)  Recent Labs  11/30/16 1326  BNP 221.3*    ProBNP (last 3 results)  Recent Labs  01/10/17 1520  PROBNP 1,062*     Other Studies Reviewed Today:  Echo Study Conclusions 11/2016  - Left ventricle: The cavity size was normal. Wall thickness was increased in a pattern of moderate LVH. Systolic function was vigorous. The estimated ejection fraction was in the range of 65% to 70%. - Aortic valve: AV prosthesis is well seated Peak and mean gradients through the valve atr 47 and 25 mm Hg respectively These gradients are lower than those reported in echo of March 2018 - Mitral valve: MV prosthesis is diffiuclt to see well Peak and mean gradients through the valve are 18 and 8 mm Hg respectively. MVA by P T1/2 is 1.8 cm2 No significant MR. No significant change in gradients from echo in March 2018. Valve area by pressure half-time: 1.8 cm^2. - Pulmonary arteries: PA peak pressure: 43 mm Hg (S).   Assessment/Plan:  1. Chronic diastolic CHF - her weight is down - she looks better clinically. Recheck BMET today. May need to adjust her Lasix down further but overall she does look better today.   2. Chronic shortness of breath/hypoxia/ILD -Uses home oxygen 3L - has sleep study set up.   3. CKD -Repeat lab today.  4. Atrial flutter -Pt had aflutter after valve surgery and was on amiodarone for a time. Notes indicate that if pt needs further  antiarrhythmic, would need to be different.  -Cardioverted 08/2014 and has maintained sinus rhythm. Now on BB. -currently in sinus rhythm with 1st degree AVB in the 50's -remains on coumadin  5. Valvular heart disease S/P AVR and MVR (mechanical valves) -anticoagulated with warfarin. Pharmacy to manage warfarin. -Per her post-op echos her valves seem to be small for her size. -INR has remained therapeutic. Continue usual warfarin dosing.   6. Essential hypertension -BP ok here today -Continue current medications  7. Hyperlipidemia -On statin therapy. Last lipid panel in Epic was 11/2014, LDL 82. -Updatedlipid panel: LDL 69  8. Left leg cellulitis: -has improved with diuresis and antibiotics.    9. Morbid obesity: she is considering bariatric surgery   Current medicines are reviewed with the patient today.  The patient does not have concerns regarding medicines other than what has been noted above.  The following changes have been made:  See above.  Labs/ tests ordered today include:    Orders Placed This Encounter  Procedures  . Basic metabolic panel     Disposition:   FU with me in 3 to 4 months.   Patient is agreeable to this plan and will call if any problems develop in the interim.  SignedTruitt Merle, NP  02/22/2017 8:52 AM  [facility] [address] [place], [place]  [address] Phone: [phone] Fax: [phone]

## 2017-02-22 NOTE — Patient Instructions (Addendum)
We will be checking the following labs today - BMET   Medication Instructions:    Continue with your current medicines.     Testing/Procedures To Be Arranged:  N/A  Follow-Up:   See me in about 3 to 4 months    Other Special Instructions:   N/A    If you need a refill on your cardiac medications before your next appointment, please call your pharmacy.   Call the [facility] office at [phone] if you have any questions, problems or concerns.      

## 2017-02-24 ENCOUNTER — Other Ambulatory Visit: Payer: Self-pay | Admitting: Nurse Practitioner

## 2017-02-25 ENCOUNTER — Telehealth: Payer: Self-pay | Admitting: Nurse Practitioner

## 2017-02-25 MED ORDER — FUROSEMIDE 40 MG PO TABS: ORAL_TABLET | ORAL | 5 refills | Status: DC

## 2017-02-25 NOTE — Telephone Encounter (Signed)
new message    Pt is calling.     *STAT* If patient is at the pharmacy, call can be transferred to refill team.   1. Which medications need to be refilled? (please list name of each medication and dose if known) furosemide 40 mg  2. Which pharmacy/location (including street and city if local pharmacy) is medication to be sent to? [facility] [place]  3. Do they need a 30 day or 90 day supply? 30 day

## 2017-02-25 NOTE — Telephone Encounter (Signed)
Spoke with patient and she stated she was taking Furosemide 40 mg 3 in the morning and 2 in the evening. Rx sent to pharmacy as requested

## 2017-02-25 NOTE — Telephone Encounter (Signed)
Please clarify how this should be ordered. Patient was seen yesterday by [name] but the order was put in with two different sigs and on class "no print" so the pharmacy did not receive it. Thanks, MI

## 2017-02-27 ENCOUNTER — Telehealth: Payer: Self-pay | Admitting: Pulmonary Disease

## 2017-02-27 NOTE — Telephone Encounter (Signed)
Reports she has had a "cold" for 3-4 days. She reports her chest is "tight" and she has been using her nebulizer 3-4 times daily. She reports her mucus is "beige" and amounts to about a "dime" size mucus three times daily. She has been wheezing some. No fever, chills, or sweats. No sinus congestion or drainage. No sore throat. Has had Flu vaccine. No myalgias. No nausea, emesis, or diarrhea. No nocturnal awakenings with breathing problems. Sick contacts through daughter, granddaughter & sister. Prescribed prednisone 40 mg daily 4 days. Instructed patient to use Mucinex twice daily. Also advised her to continue using her nebulizer 3 times daily while she is ill. She will contact us if she does not clinically improve or seek immediate medical attention if she worsens significantly.

## 2017-02-27 NOTE — Telephone Encounter (Signed)
Noted  

## 2017-03-03 ENCOUNTER — Other Ambulatory Visit: Payer: Self-pay | Admitting: Pulmonary Disease

## 2017-03-04 ENCOUNTER — Ambulatory Visit (HOSPITAL_BASED_OUTPATIENT_CLINIC_OR_DEPARTMENT_OTHER): Payer: Medicare Other | Attending: Cardiology | Admitting: Cardiology

## 2017-03-04 VITALS — Ht 66.0 in | Wt 320.0 lb

## 2017-03-04 DIAGNOSIS — G4733 Obstructive sleep apnea (adult) (pediatric): Secondary | ICD-10-CM

## 2017-03-04 DIAGNOSIS — Z6841 Body Mass Index (BMI) 40.0 and over, adult: Secondary | ICD-10-CM | POA: Insufficient documentation

## 2017-03-04 DIAGNOSIS — G4761 Periodic limb movement disorder: Secondary | ICD-10-CM | POA: Diagnosis not present

## 2017-03-04 DIAGNOSIS — I493 Ventricular premature depolarization: Secondary | ICD-10-CM | POA: Diagnosis not present

## 2017-03-04 DIAGNOSIS — R0683 Snoring: Secondary | ICD-10-CM | POA: Diagnosis not present

## 2017-03-04 DIAGNOSIS — E669 Obesity, unspecified: Secondary | ICD-10-CM | POA: Diagnosis not present

## 2017-03-08 ENCOUNTER — Ambulatory Visit (INDEPENDENT_AMBULATORY_CARE_PROVIDER_SITE_OTHER): Payer: Medicare Other | Admitting: Pharmacist

## 2017-03-08 ENCOUNTER — Other Ambulatory Visit: Payer: Self-pay | Admitting: Nurse Practitioner

## 2017-03-08 DIAGNOSIS — I4892 Unspecified atrial flutter: Secondary | ICD-10-CM | POA: Diagnosis not present

## 2017-03-08 DIAGNOSIS — I350 Nonrheumatic aortic (valve) stenosis: Secondary | ICD-10-CM | POA: Diagnosis not present

## 2017-03-08 LAB — POCT INR: INR: 2.6

## 2017-03-15 NOTE — Procedures (Signed)
   Patient Name: [name], [name] Date: 03/04/2017 Gender: Female D.O.B: Apr 04, 1961 Age (years): 55 Referring Provider: Daune Perch NP Height (inches): 52 Interpreting Physician: Fransico Him MD, ABSM Weight (lbs): 320 RPSGT: Jorge Ny BMI: 52 MRN: [id number] Neck Size: 18.00  CLINICAL INFORMATION Sleep Study Type: NPSG  Indication for sleep study: Obesity, OSA  Epworth Sleepiness Score: 7  SLEEP STUDY TECHNIQUE As per the AASM Manual for the Scoring of Sleep and Associated Events v2.3 (April 2016) with a hypopnea requiring 4% desaturations.  The channels recorded and monitored were frontal, central and occipital EEG, electrooculogram (EOG), submentalis EMG (chin), nasal and oral airflow, thoracic and abdominal wall motion, anterior tibialis EMG, snore microphone, electrocardiogram, and pulse oximetry.  MEDICATIONS Medications self-administered by patient taken the night of the study : N/A  SLEEP ARCHITECTURE The study was initiated at 10:38:30 PM and ended at 4:41:07 AM.  Sleep onset time was 32.7 minutes and the sleep efficiency was 74.0%. The total sleep time was 268.5 minutes.  Stage REM latency was 98.5 minutes.  The patient spent 10.43% of the night in stage N1 sleep, 68.53% in stage N2 sleep, 0.00% in stage N3 and 21.04% in REM.  Alpha intrusion was absent.  Supine sleep was 85.66%.  RESPIRATORY PARAMETERS The overall apnea/hypopnea index (AHI) was 1.3 per hour. There were 4 total apneas, including 4 obstructive, 0 central and 0 mixed apneas. There were 2 hypopneas and 26 RERAs.  The AHI during Stage REM sleep was 0.0 per hour.  AHI while supine was 1.6 per hour.  The mean oxygen saturation was 91.68%. The minimum SpO2 during sleep was 87.00%.  soft snoring was noted during this study.  CARDIAC DATA The 2 lead EKG demonstrated sinus rhythm. The mean heart rate was 58.00 beats per minute. Other EKG findings include: PVCs.  LEG MOVEMENT  DATA The total PLMS were 117 with a resulting PLMS index of 26.15. Associated arousal with leg movement index was 6.5 .  IMPRESSIONS - No significant obstructive sleep apnea occurred during this study (AHI = 1.3/h). - No significant central sleep apnea occurred during this study (CAI = 0.0/h). - Mild oxygen desaturation was noted during this study (Min O2 = 87.00%). - The patient snored with soft snoring volume. - EKG findings include PVCs. - Moderate periodic limb movements of sleep occurred during the study. Associated arousals were significant.  DIAGNOSIS - Periodic Limb Movement Disorder  RECOMMENDATIONS - Avoid alcohol, sedatives and other CNS depressants that may worsen sleep apnea and disrupt normal sleep architecture. - Sleep hygiene should be reviewed to assess factors that may improve sleep quality. - Weight management and regular exercise should be initiated or continued if appropriate. - The patient should be screened for Restless Legs Syndrome.  [place], American Board of Sleep Medicine  ELECTRONICALLY SIGNED ON:  03/15/2017, 10:23 PM [place] PH: [phone]   FX: [phone] [place]

## 2017-03-17 NOTE — Progress Notes (Signed)
I ordered this test on behalf of Dr. Meda Coffee upon hospital discharge. The patient is usually seen by [name] and [name]. Can you please relay the results to the pt:  -No significant sleep apnea -periodic limb movements  RECOMMENDATIONS - Avoid alcohol, sedatives and other CNS depressants that may disrupt normal sleep architecture. - Follow good Sleep hygiene  - Weight management and regular exercise should be initiated or continued - The patient should be screened for Restless Legs Syndrome.  She can review these findings at her follow up with Cecille Rubin.   Thank you, Daune Perch, NP

## 2017-03-17 NOTE — Progress Notes (Signed)
test

## 2017-03-18 ENCOUNTER — Telehealth: Payer: Self-pay | Admitting: *Deleted

## 2017-03-18 NOTE — Telephone Encounter (Signed)
Informed patient of sleep study results and patient understanding was verbalized. Patient understands she has no sleep apnea. Patient understands she has a f/u appointment scheduled with [name] 05/18/17. Patient was grateful for the call and thanked me.

## 2017-03-18 NOTE — Telephone Encounter (Signed)
-----   Message from Loren Racer, LPN sent at 63/14/9702 10:31 AM EDT ----- Gae Bon,  Wasn't sure if Dr. Radford Pax sent these results to you or not.    Thanks Anderson Malta  ----- Message ----- From: Daune Perch, NP Sent: 03/17/2017   9:59 AM To: Loren Racer, LPN    ----- Message ----- From: Daune Perch, NP Sent: 03/15/2017  10:26 PM To: Daune Perch, NP

## 2017-03-18 NOTE — Telephone Encounter (Signed)
    Informed patient of sleep study results and patient understanding was verbalized. Patient understands she has no sleep apnea. Patient understands she has a f/u appointment scheduled with [name] 05/18/17. Patient was grateful for the call and thanked me.

## 2017-03-18 NOTE — Telephone Encounter (Signed)
-----   Message from Sueanne Margarita, MD sent at 03/15/2017 10:26 PM EDT ----- Please let patient know that sleep study showed no significant sleep apnea.

## 2017-03-30 ENCOUNTER — Other Ambulatory Visit (HOSPITAL_COMMUNITY): Payer: Self-pay | Admitting: General Surgery

## 2017-03-30 ENCOUNTER — Ambulatory Visit (INDEPENDENT_AMBULATORY_CARE_PROVIDER_SITE_OTHER): Payer: Medicare Other | Admitting: Pharmacist Clinician (PhC)/ Clinical Pharmacy Specialist

## 2017-03-30 DIAGNOSIS — Z7901 Long term (current) use of anticoagulants: Secondary | ICD-10-CM | POA: Diagnosis not present

## 2017-03-30 DIAGNOSIS — Z6841 Body Mass Index (BMI) 40.0 and over, adult: Secondary | ICD-10-CM | POA: Diagnosis not present

## 2017-03-30 DIAGNOSIS — Z952 Presence of prosthetic heart valve: Secondary | ICD-10-CM

## 2017-03-30 DIAGNOSIS — I350 Nonrheumatic aortic (valve) stenosis: Secondary | ICD-10-CM | POA: Diagnosis not present

## 2017-03-30 DIAGNOSIS — J849 Interstitial pulmonary disease, unspecified: Secondary | ICD-10-CM | POA: Diagnosis not present

## 2017-03-30 DIAGNOSIS — I5032 Chronic diastolic (congestive) heart failure: Secondary | ICD-10-CM | POA: Diagnosis not present

## 2017-03-30 DIAGNOSIS — E7800 Pure hypercholesterolemia, unspecified: Secondary | ICD-10-CM | POA: Diagnosis not present

## 2017-03-30 DIAGNOSIS — I10 Essential (primary) hypertension: Secondary | ICD-10-CM | POA: Diagnosis not present

## 2017-03-30 LAB — POCT INR: INR: 4.5

## 2017-03-31 ENCOUNTER — Telehealth: Payer: Self-pay

## 2017-03-31 NOTE — Telephone Encounter (Signed)
   [facility] Pre-operative Risk Assessment    Request for surgical clearance:  1. What type of surgery is being performed? Bariatric Sleeve Surgery   2. When is this surgery scheduled? TBD   3. Are there any medications that need to be held prior to surgery and how long? Per note received, "Because of her mechanical valve she is on aggressive anticoagulation. We did explain that this will raise her risk for bleeding postoperatively since we will need to be aggressive with her anticoagulation because of her mechanical valves. We will need cardiology to help gauge the discussion about what type of anticoagulation she will need postoperatively since it'll be a day or 2 before we can start her Coumadin after surgery."  4. Practice name and name of physician performing surgery? [facility] // Dr. [name]   5. What is your office phone and fax number?  1. Phone: [phone] 2. Fax: [phone] attn: Jan   6. Anesthesia type (None, local, MAC, general) ? general

## 2017-03-31 NOTE — Telephone Encounter (Signed)
    Chart reviewed as part of pre-operative protocol coverage. Patient was last seen 01/2017 by Diana Merle NP. [name] was a former patient of [name] but now only follows with [name]; [name] had agreed to co-manage. Very complicated patient with prior valve replacements, possibly too small for her. RCRI calculated at 6.6%. Will forward this chart to [name] who knows the patient well - main question is whether or not [name] thinks patient will need a follow-up appointment to formally address the question posed by general surgery regarding coumadin, and to optimize status prior to surgery. [name], please route your reply to P CV DIV PREOP as I will leave this in the box pending your input. Thanks!  03/31/2017, 3:07 PM

## 2017-03-31 NOTE — Telephone Encounter (Signed)
I will need to discuss this matter with [name]. We are both in the office next Tuesday and I will talk with him about this.  Further disposition to follow.   [name], RN, [facility] [address] [place], [place]  [address] [phone]

## 2017-04-05 NOTE — Telephone Encounter (Signed)
Have discussed [name] with [name] here in the office today. We agree that overall she will be at elevated risk for surgery given her multiple co-morbidities. She will be at increased risk for bleeding due to anticoagulation. She is also at increased risk for embolic event (2 mechanical valves - especially mechanical mitral valve). She will need bridging with Lovenox up to 12 hours prior to surgery. Would recommend trying to restart/bridge within 48 hours after surgery. Cardiology will need to be following during her time in the hospital. I will be happy to see as we get closer to that date to recheck her. She was doing well at her last visit with me.   I am routine this message back to the pre op pool and to Dr. Greer Pickerel.   [name], RN, [place] [address] [place], [place]  [address] [phone]

## 2017-04-05 NOTE — Telephone Encounter (Signed)
S/w pt is aware of Lori's recommendation's, will call to schedule appt with Cecille Rubin when pt find's out surgery date.

## 2017-04-05 NOTE — Telephone Encounter (Signed)
Please call and tell her to let us know when her surgery is scheduled. I would like to see her prior to surgery.

## 2017-04-12 ENCOUNTER — Ambulatory Visit (HOSPITAL_COMMUNITY)
Admission: RE | Admit: 2017-04-12 | Discharge: 2017-04-12 | Disposition: A | Discharge status: Home / Self Care | Payer: Medicare Other | Source: Ambulatory Visit | Attending: General Surgery | Admitting: General Surgery

## 2017-04-12 DIAGNOSIS — M5134 Other intervertebral disc degeneration, thoracic region: Secondary | ICD-10-CM | POA: Insufficient documentation

## 2017-04-12 DIAGNOSIS — Z952 Presence of prosthetic heart valve: Secondary | ICD-10-CM | POA: Insufficient documentation

## 2017-04-12 DIAGNOSIS — K8020 Calculus of gallbladder without cholecystitis without obstruction: Secondary | ICD-10-CM | POA: Insufficient documentation

## 2017-04-12 DIAGNOSIS — M5136 Other intervertebral disc degeneration, lumbar region: Secondary | ICD-10-CM | POA: Insufficient documentation

## 2017-04-12 DIAGNOSIS — K449 Diaphragmatic hernia without obstruction or gangrene: Secondary | ICD-10-CM | POA: Insufficient documentation

## 2017-04-12 DIAGNOSIS — I509 Heart failure, unspecified: Secondary | ICD-10-CM | POA: Diagnosis not present

## 2017-04-12 DIAGNOSIS — K828 Other specified diseases of gallbladder: Secondary | ICD-10-CM | POA: Diagnosis not present

## 2017-04-14 ENCOUNTER — Telehealth: Payer: Self-pay | Admitting: *Deleted

## 2017-04-14 ENCOUNTER — Ambulatory Visit: Payer: Medicare Other | Admitting: Registered"

## 2017-04-14 NOTE — Telephone Encounter (Signed)
Pt called stating granddaughter had strep throat and she had no transportation to come for her appointment this morning Pt instructed would try to see her this afternoon but she states did not have any transportation  Pt instructed that the next available appt would Monday Nov 26th and she states she will take that appt. Pt states she has not scheduled her surgery

## 2017-04-18 ENCOUNTER — Other Ambulatory Visit: Payer: Self-pay | Admitting: Cardiovascular Disease

## 2017-04-25 ENCOUNTER — Ambulatory Visit (INDEPENDENT_AMBULATORY_CARE_PROVIDER_SITE_OTHER): Payer: Medicare Other | Admitting: *Deleted

## 2017-04-25 DIAGNOSIS — I350 Nonrheumatic aortic (valve) stenosis: Secondary | ICD-10-CM

## 2017-04-25 DIAGNOSIS — Z5181 Encounter for therapeutic drug level monitoring: Secondary | ICD-10-CM

## 2017-04-25 LAB — POCT INR: INR: 3

## 2017-04-25 NOTE — Patient Instructions (Signed)
Continue taking 1/2 tablet every day except 1 tablet on Sundays, Tuesdays and Thursdays . Recheck in 3 weeks with MD appt.  Coumadin Clinic # [phone].

## 2017-04-27 ENCOUNTER — Other Ambulatory Visit: Payer: Self-pay | Admitting: Pulmonary Disease

## 2017-04-28 ENCOUNTER — Encounter: Payer: Medicare Other | Attending: General Surgery | Admitting: Registered"

## 2017-04-28 ENCOUNTER — Encounter: Payer: Self-pay | Admitting: Registered"

## 2017-04-28 DIAGNOSIS — Z6841 Body Mass Index (BMI) 40.0 and over, adult: Secondary | ICD-10-CM | POA: Insufficient documentation

## 2017-04-28 DIAGNOSIS — N189 Chronic kidney disease, unspecified: Secondary | ICD-10-CM

## 2017-04-28 DIAGNOSIS — Z713 Dietary counseling and surveillance: Secondary | ICD-10-CM | POA: Insufficient documentation

## 2017-04-28 NOTE — Progress Notes (Signed)
Pre-Op Assessment Visit:  Pre-Operative Sleeve Gastrectomy Surgery  Medical Nutrition Therapy:  Appt start time: 8:05  End time: 9:06  Patient was seen on 04/28/2017 for Pre-Operative Nutrition Assessment. Assessment and letter of approval faxed to [facility] Bariatric Surgery Program coordinator on 04/28/2017.   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 Pt states she lost hair when she had her valve. Pt states she is eating soup and crackers mostly now while living with sister due to financial constraints.   Per insurance, pt needs 6 SWL visits prior to surgery.    24 hr Dietary Recall: First Meal: 2 packs of oatmeal  Snack: sometimes goldfish or crackers Second Meal: frozen bag - green beans, red potatoes Snack: none Third Meal: tomato, chicken noodle, cream of chicken soup and crackers Snack: none Beverages: water w/ lemonade flavor pack  Encouraged to engage in 75 minutes of moderate physical activity including cardiovascular and weight baring weekly  Handouts given during visit include:  . Pre-Op Goals . Bariatric Surgery Protein Shakes . Vitamin and Mineral Recommendations  During the appointment today the following Pre-Op Goals were reviewed with the patient: . Track your food and beverage: MyFitness Pal or Baritastic App . Make healthy food choices . Begin to limit portion sizes . Limited concentrated sugars and fried foods . Keep fat/sugar in the single digits per serving on          food labels . Practice CHEWING  your food  (aim for 30 chews per bite or until applesauce consistency) . Practice not drinking 15 minutes before, during, and 30 minutes after each meal/snack . Avoid all carbonated beverages  . Avoid/limit caffeinated beverages  . Avoid all sugar-sweetened beverages . Avoid alcohol . Consume 3 meals per day; eat every 3-5 hours . Make a list of non-food related activities . Aim for 64-100 ounces of FLUID daily  . Aim for at least 60-80 grams of PROTEIN daily . Look for a liquid protein source that contain ?15 g protein and ?5 g carbohydrate  (ex: shakes, drinks, shots) . Physical activity is an important part of a healthy lifestyle so keep it moving!  Follow diet recommendations listed below Energy and Macronutrient Recommendations: Calories: 1600 Carbohydrate: 180 Protein: 120 Fat: 44  Demonstrated degree of understanding via:  Teach Back   Teaching Method Utilized:  Visual Auditory Hands on  Barriers to learning/adherence to lifestyle change: financial contraints  Patient to call the Nutrition and Diabetes Education Services to enroll in Pre-Op and Post-Op Nutrition Education when surgery date is scheduled.

## 2017-05-02 ENCOUNTER — Other Ambulatory Visit: Payer: Self-pay | Admitting: Pulmonary Disease

## 2017-05-02 MED ORDER — ALBUTEROL SULFATE (2.5 MG/3ML) 0.083% IN NEBU: 2.5000 mg | INHALATION_SOLUTION | RESPIRATORY_TRACT | 5 refills | Status: DC | PRN

## 2017-05-04 ENCOUNTER — Other Ambulatory Visit: Payer: Self-pay | Admitting: Pulmonary Disease

## 2017-05-04 MED ORDER — ALBUTEROL SULFATE HFA 108 (90 BASE) MCG/ACT IN AERS: 2.0000 | INHALATION_SPRAY | Freq: Two times a day (BID) | RESPIRATORY_TRACT | 5 refills | Status: DC | PRN

## 2017-05-04 NOTE — Progress Notes (Signed)
Refill request sent from [facility]. Prescription for albuterol was sent in.

## 2017-05-05 DIAGNOSIS — J209 Acute bronchitis, unspecified: Secondary | ICD-10-CM | POA: Diagnosis not present

## 2017-05-13 ENCOUNTER — Ambulatory Visit (INDEPENDENT_AMBULATORY_CARE_PROVIDER_SITE_OTHER): Payer: Medicare Other | Admitting: *Deleted

## 2017-05-13 DIAGNOSIS — Z5181 Encounter for therapeutic drug level monitoring: Secondary | ICD-10-CM

## 2017-05-13 DIAGNOSIS — J0140 Acute pansinusitis, unspecified: Secondary | ICD-10-CM | POA: Diagnosis not present

## 2017-05-13 DIAGNOSIS — I10 Essential (primary) hypertension: Secondary | ICD-10-CM | POA: Diagnosis not present

## 2017-05-13 DIAGNOSIS — R7301 Impaired fasting glucose: Secondary | ICD-10-CM | POA: Diagnosis not present

## 2017-05-13 DIAGNOSIS — J449 Chronic obstructive pulmonary disease, unspecified: Secondary | ICD-10-CM | POA: Diagnosis not present

## 2017-05-13 DIAGNOSIS — E785 Hyperlipidemia, unspecified: Secondary | ICD-10-CM | POA: Diagnosis not present

## 2017-05-13 DIAGNOSIS — I350 Nonrheumatic aortic (valve) stenosis: Secondary | ICD-10-CM | POA: Diagnosis not present

## 2017-05-13 DIAGNOSIS — Z515 Encounter for palliative care: Secondary | ICD-10-CM | POA: Diagnosis not present

## 2017-05-13 LAB — POCT INR: INR: 1.6

## 2017-05-13 NOTE — Patient Instructions (Signed)
Description   Today and tomorrow take 1 tablet, then Continue taking 1/2 tablet every day except 1 tablet on Sundays, Tuesdays and Thursdays . Recheck in 2 weeks. Coumadin Clinic # [phone].

## 2017-05-17 DIAGNOSIS — D649 Anemia, unspecified: Secondary | ICD-10-CM | POA: Diagnosis not present

## 2017-05-17 DIAGNOSIS — D751 Secondary polycythemia: Secondary | ICD-10-CM | POA: Diagnosis not present

## 2017-05-18 ENCOUNTER — Encounter: Payer: Self-pay | Admitting: Nurse Practitioner

## 2017-05-18 ENCOUNTER — Ambulatory Visit (INDEPENDENT_AMBULATORY_CARE_PROVIDER_SITE_OTHER): Payer: Medicare Other | Admitting: Nurse Practitioner

## 2017-05-18 VITALS — BP 138/80 | HR 71 | Ht 64.5 in | Wt 329.1 lb

## 2017-05-18 DIAGNOSIS — Z01818 Encounter for other preprocedural examination: Secondary | ICD-10-CM | POA: Diagnosis not present

## 2017-05-18 DIAGNOSIS — R0602 Shortness of breath: Secondary | ICD-10-CM

## 2017-05-18 DIAGNOSIS — Z952 Presence of prosthetic heart valve: Secondary | ICD-10-CM | POA: Diagnosis not present

## 2017-05-18 DIAGNOSIS — D751 Secondary polycythemia: Secondary | ICD-10-CM | POA: Diagnosis not present

## 2017-05-18 DIAGNOSIS — I5032 Chronic diastolic (congestive) heart failure: Secondary | ICD-10-CM | POA: Diagnosis not present

## 2017-05-18 NOTE — Progress Notes (Signed)
CARDIOLOGY OFFICE NOTE  Date:  05/18/2017    [name] Date of Birth: 1961/04/28 Medical Record #[id number]  PCP:  [name], [place]  Cardiologist:  [name]    Chief Complaint  Patient presents with  . Pre-op Exam  . Atrial Fibrillation  . Cardiac Valve Problem    Follow up visit - seen for [name]    History of Present Illness: [name] is a 56 y.o. female who presents today for a follow up visit. Seen for [name]. Primarily sees me.   She has a hx of valvular heart disease, idiopathic pulmonary hemosiderosis, ILD, polycythemia - requiring phlebotomies, CKD, HTN, HLD, &OSA. She underwent mechanical MVR and mechanical AVR with [name] at [facility] 07/2014. From [facility]: "Cath and echo showed severe aortic stenosis, moderate mitral stenosis, PHBP, very small LV cavity and small aortic root with preserved ventricular function and no significant coronary disease. The patient was taken to the operating room by [name], on 08/06/2014 where she underwent Aortic valve replacement 23mm StJude Regent valve, mitral valve replacement 72mm St Jude mechanical valve finding rheumatic disease."      Post op she was noted to be in AFlutter. Rate was uncontrolled. Amiodarone dose was increased as well as the beta-blocker. She has had tolerance issues with amiodarone but was subsequently cardioverted and was able to have her amiodarone stopped in July of 2016 - noted that she would need different AAD if needed for recurrent arrhythmia. She underwent cardioversion on 4/21 - the dictated note says the post EKG was atrial flutter however the EKG post cardioversion showed NSR. We brought her back for an early post cardioversion visit to recheck/verify her EKG - she was in NSR. She was able to have her Midodrine stopped that she had been put on during her surgery.               Back in November of 2016 - I saw her - she was short of breath - I  got a CBC - Horribly anemic - I sent her to GI for colonoscopy and she had to be transfused as well.   I saw her in February of 2018 and she was doing ok. BP running up and suspected this was due to excess salt - eating Roman noodles for almost every meal due to cost. Echo updated back in July and reviewed with [name] - her valves are noted to be undersized.   I then saw her back in August - lots of swelling. Weight was up - she had had cellulitis and had been admitted previously. Ended up re-admitting for IV diuresis. Was diuresed down 9 pounds. Sent home on 120 mg of Lasix BID. On return visit a month ago - she was on the incorrect dose of Lasix (actually on less) - she had improved clinically but weight was already climbing. She was starting to consider bariatric surgery. She has chronic lymphedema of the right leg from a remote injury.   Last seen by me back in September - she had had Bell's palsy - resolving. Got her sleep study - surprisingly does not have sleep apnea. Remains on her oxygen. She was considering bariatric surgery and has seen Dr. [name]. She is followed by Dr. [name] at [facility] for her hematology issues - she does require phlebotomy.   Comes in today. Here alone. She says she is doing well. Breathing is stable. Uses oxygen as she needs to. No  chest pain. She is planning on gastric sleeve - has seen the dietician and now seeing behavioral health. No set time yet. She understands that she is at increased risk in light of her medical issues. Her anticoagulation will be an issue - see below. Swelling is stable. Weight remains labile. Overall she feels like she is doing well.   Past Medical History:  Diagnosis Date  . Allergic rhinitis   . Anemia   . Anxiety   . Aortic stenosis   . Arthritis    "lower back" (11/30/2016)  . CAO (chronic airflow obstruction) (HCC)   . Cellulitis of left lower extremity 11/30/2016  . Depressive disorder   . Gout   . History of blood  transfusion 03/2016   "I was anemic"  . HTN (hypertension)   . Hyperlipidemia   . Hypertriglyceridemia   . Kidney disease    Dr. Graylon Gunning  . Menopausal symptoms   . Mitral and aortic heart valve diseases, unspecified 07/2014   s/p AVR with #19 St Jude and s/p MVR with 89mm St. Jude per [name] at [facility]  . Mitral stenosis   . Morbid obesity ([place])   . Noninfectious lymphedema   . On home oxygen therapy    "2-3L when I'm up doing a whole lot" (11/30/2016)  . Renal insufficiency   . Shortness of breath    with exertion  . Sleep apnea   . Undiagnosed cardiac murmurs   . Vitamin D deficiency     Past Surgical History:  Procedure Laterality Date  . AORTIC AND MITRAL VALVE REPLACEMENT  07/2014   s/p AVR with #19 St Jude and s/p MVR with 29mm St. Jude per [name] at [facility]  . CARDIAC CATHETERIZATION  07/2014  . CARDIAC VALVE REPLACEMENT    . CARDIOVERSION N/A 09/19/2014   Procedure: CARDIOVERSION;  Surgeon: Sanda Klein, MD;  Location: [facility];  Service: Cardiovascular;  Laterality: N/A;  . [place]  . LUNG BIOPSY Left 10/03/2013   Procedure: Left Lung Biopsy;  Surgeon: Melrose Nakayama, MD;  Location: [facility];  Service: Thoracic;  Laterality: Left;  . TONSILLECTOMY    . VIDEO ASSISTED THORACOSCOPY Left 10/03/2013   Procedure: Left Video Assited Thoracoscopy;  Surgeon: Melrose Nakayama, MD;  Location: [facility];  Service: Thoracic;  Laterality: Left;  Marland Kitchen VIDEO BRONCHOSCOPY Bilateral 10/25/2012   Procedure: VIDEO BRONCHOSCOPY WITH FLUORO;  Surgeon: Kathee Delton, MD;  Location: [facility];  Service: Cardiopulmonary;  Laterality: Bilateral;     Medications: Current Meds  Medication Sig  . acetaminophen (TYLENOL) 650 MG CR tablet Take 1,300 mg by mouth every 8 (eight) hours as needed for pain.   Marland Kitchen albuterol (PROVENTIL HFA;VENTOLIN HFA) 108 (90 Base) MCG/ACT inhaler Inhale 2 puffs into the lungs 2 (two) times daily as needed for wheezing or shortness of breath.  Reported on 06/20/2015  . albuterol (PROVENTIL) (2.5 MG/3ML) 0.083% nebulizer solution Take 3 mLs (2.5 mg total) by nebulization every 4 (four) hours as needed for wheezing or shortness of breath.  . allopurinol (ZYLOPRIM) 300 MG tablet Take 300 mg by mouth at bedtime.   Marland Kitchen aspirin EC 81 MG tablet Take 81 mg by mouth daily.  Marland Kitchen buPROPion (WELLBUTRIN XL) 300 MG 24 hr tablet Take 300 mg by mouth daily.   . cetirizine (ZYRTEC) 10 MG tablet Take 1 tablet (10 mg total) by mouth daily. (Patient taking differently: Take 10 mg by mouth at bedtime. )  . Cholecalciferol (VITAMIN  D3) 2000 UNITS TABS Take 2,000 Units by mouth daily.   . citalopram (CELEXA) 20 MG tablet Take 20 mg by mouth daily.   . furosemide (LASIX) 40 MG tablet 3 tablets by mouth in the AM and 2 tablets in the PM  . metoprolol (LOPRESSOR) 50 MG tablet Take 50 mg by mouth 2 (two) times daily.  . montelukast (SINGULAIR) 10 MG tablet Take 10 mg by mouth daily.   . OXYGEN Inhale 2.5-3 L into the lungs continuous.   . potassium chloride SA (KLOR-CON M20) 20 MEQ tablet Take 1 tablet (20 mEq total) by mouth 2 (two) times daily.  . pravastatin (PRAVACHOL) 80 MG tablet Take 80 mg by mouth daily.   . SYMBICORT 80-4.5 MCG/ACT inhaler INHALE 2 PUFFS INTO THE LUNGS 2 (TWO) TIMES DAILY.  Marland Kitchen triamcinolone (NASACORT AQ) 55 MCG/ACT AERO nasal inhaler Place 2 sprays into the nose daily. (Patient taking differently: Place 2 sprays into the nose daily as needed (for seasonal allergies). )  . warfarin (COUMADIN) 7.5 MG tablet TAKE 1 TABLET BY MOUTH DAILY OR AS DIRECTED BY COUMADIN CLINIC   Allergies: No Known Allergies  Social History: The patient  reports that she quit smoking about 3 years ago. Her smoking use included cigarettes. She has a 35.00 pack-year smoking history. she has never used smokeless tobacco. She reports that she does not drink alcohol or use drugs.   Family History: The patient's family history includes Cancer in her mother; Dementia in  her mother; Diabetes in her brother; Emphysema in her mother; Heart disease in her father; Hypertension in her brother, father, mother, and sister; Kidney disease in her father; Kidney failure in her father; Stroke in her brother.   Review of Systems: Please see the history of present illness.   Otherwise, the review of systems is positive for none.   All other systems are reviewed and negative.   Physical Exam: VS:  BP 138/80   Pulse 71   Ht 5' 4.5" (1.638 m)   Wt (!) 329 lb 1.9 oz (149.3 kg)   SpO2 91%   BMI 55.62 kg/m  .  BMI Body mass index is 55.62 kg/m.  Wt Readings from Last 3 Encounters:  05/18/17 (!) 329 lb 1.9 oz (149.3 kg)  04/28/17 (!) 326 lb 12.8 oz (148.2 kg)  03/04/17 (!) 320 lb (145.2 kg)    General: Pleasant. Morbidly obese. Alert and in no acute distress.   HEENT: Normal but with missing teeth Neck: Supple, no JVD, carotid bruits, or masses noted.  Cardiac: Regular rate and rhythm. Outflow murmur.  No significant edema on the left - she has chronic lymphedema on the right.  Respiratory:  Lungs are clear to auscultation bilaterally with normal work of breathing. She has her oxygen on today.  GI: Soft and nontender.  MS: No deformity or atrophy. Gait and ROM intact.  Skin: Warm and dry. Color is normal.  Neuro:  Strength and sensation are intact and no gross focal deficits noted.  Psych: Alert, appropriate and with normal affect.   LABORATORY DATA:  EKG:  EKG is ordered today. This demonstrates NSR with 1st degree AV block.  Lab Results  Component Value Date   WBC 10.0 02/04/2017   HGB 16.5 (H) 02/04/2017   HCT 51.6 (H) 02/04/2017   PLT 180 02/04/2017   GLUCOSE 112 (H) 02/22/2017   CHOL 140 01/13/2017   TRIG 138 01/13/2017   HDL 43 01/13/2017   LDLCALC 69 01/13/2017   ALT  17 01/10/2017   AST 24 01/10/2017   NA 140 02/22/2017   K 4.4 02/22/2017   CL 96 02/22/2017   CREATININE 1.39 (H) 02/22/2017   BUN 23 02/22/2017   CO2 26 02/22/2017   TSH 3.57  09/18/2014   INR 1.6 05/13/2017   Lab Results  Component Value Date   INR 1.6 05/13/2017   INR 3.0 04/25/2017   INR 4.5 03/30/2017     BNP (last 3 results) Recent Labs    11/30/16 1326  BNP 221.3*    ProBNP (last 3 results) Recent Labs    01/10/17 1520  PROBNP 1,062*     Other Studies Reviewed Today:  Echo Study Conclusions 11/2016  - Left ventricle: The cavity size was normal. Wall thickness was increased in a pattern of moderate LVH. Systolic function was vigorous. The estimated ejection fraction was in the range of 65% to 70%. - Aortic valve: AV prosthesis is well seated Peak and mean gradients through the valve atr 47 and 25 mm Hg respectively These gradients are lower than those reported in echo of March 2018 - Mitral valve: MV prosthesis is diffiuclt to see well Peak and mean gradients through the valve are 18 and 8 mm Hg respectively. MVA by P T1/2 is 1.8 cm2 No significant MR. No significant change in gradients from echo in March 2018. Valve area by pressure half-time: 1.8 cm^2. - Pulmonary arteries: PA peak pressure: 43 mm Hg (S).   Assessment/Plan:  1. Chronic diastolic CHF - stable clinically. Labs from her PCP last week noted. No changes made today.   2. Chronic shortness of breath/hypoxia/ILD -Uses home oxygen 3L Her sleep study was negative for OSA.   3. CKD Recent lab noted.   4. Atrial flutter -Pt had aflutter after valve surgery and was on amiodarone for a time. Notes indicate that if pt needs further antiarrhythmic, would need to be different.  -Cardioverted 08/2014 and has maintained sinus rhythm. Now on BB. -currently in sinus rhythm with 1st degree AVB in the 50's -remains on coumadin  5. Valvular heart disease S/P AVR and MVR (mechanical valves) -Per her post-op echos her valves seem to be small for her size. She remains on her coumadin.   6. Essential hypertension -BP looks good here today - no  changes made.   7. Hyperlipidemia -On statin therapy. Labs from last week noted.   8. Morbid obesity: she is planning to proceed with bariatric surgery - already discussed with [name] previously.  She agrees that this is for medical reasons and understands that this will be at increased risk. [name] and I both agree that overall she will be at elevated risk for surgery given her multiple co-morbidities. She will be at risk for recurrent AF/flutter. She will be at increased risk for bleeding due to anticoagulation. She is also at increased risk for embolic event (2 mechanical valves - especially mechanical mitral valve). She will need bridging with Lovenox up to 12 hours prior to surgery. Would recommend trying to restart/bridge within 48 hours after surgery. Cardiology will need to be following during her time in the hospital. She is to let us and the coumadin clinic know the date of her surgery.   Current medicines are reviewed with the patient today.  The patient does not have concerns regarding medicines other than what has been noted above.  The following changes have been made:  See above.  Labs/ tests ordered today include:    Orders Placed  This Encounter  Procedures  . EKG 12-Lead     Disposition:   FU with me in 4 months.   Patient is agreeable to this plan and will call if any problems develop in the interim.   SignedTruitt Merle, NP  05/18/2017 9:11 AM  [facility] [address] [place], [place]  [address] Phone: [phone] Fax: [phone]

## 2017-05-18 NOTE — Patient Instructions (Addendum)
We will be checking the following labs today - NONE   Medication Instructions:    Continue with your current medicines.     Testing/Procedures To Be Arranged:  N/A  Follow-Up:   See me in 4 months    Other Special Instructions:   I will send a note to Dr. [name]    If you need a refill on your cardiac medications before your next appointment, please call your pharmacy.   Call the [place] office at [phone] if you have any questions, problems or concerns.

## 2017-05-25 ENCOUNTER — Ambulatory Visit (INDEPENDENT_AMBULATORY_CARE_PROVIDER_SITE_OTHER): Payer: Medicare Other

## 2017-05-25 DIAGNOSIS — Z5181 Encounter for therapeutic drug level monitoring: Secondary | ICD-10-CM | POA: Diagnosis not present

## 2017-05-25 DIAGNOSIS — I350 Nonrheumatic aortic (valve) stenosis: Secondary | ICD-10-CM

## 2017-05-25 LAB — POCT INR: INR: 3.4

## 2017-05-25 NOTE — Patient Instructions (Addendum)
Continue taking 1/2 tablet every day except 1 tablet on Sundays, Tuesdays and Thursdays . Recheck in 3 weeks. Coumadin Clinic # [phone].

## 2017-05-30 ENCOUNTER — Ambulatory Visit: Payer: Self-pay | Admitting: Registered"

## 2017-05-30 DIAGNOSIS — N183 Chronic kidney disease, stage 3 (moderate): Secondary | ICD-10-CM | POA: Diagnosis not present

## 2017-06-01 ENCOUNTER — Encounter: Payer: Medicare Other | Attending: General Surgery | Admitting: Registered"

## 2017-06-01 ENCOUNTER — Encounter: Payer: Self-pay | Admitting: Registered"

## 2017-06-01 DIAGNOSIS — Z6841 Body Mass Index (BMI) 40.0 and over, adult: Secondary | ICD-10-CM | POA: Insufficient documentation

## 2017-06-01 DIAGNOSIS — Z713 Dietary counseling and surveillance: Secondary | ICD-10-CM | POA: Diagnosis not present

## 2017-06-01 DIAGNOSIS — N189 Chronic kidney disease, unspecified: Secondary | ICD-10-CM

## 2017-06-01 NOTE — Progress Notes (Signed)
Sleeve Gastrectomy Appt start time: 11:25 end time: 11:53  Assessment: 1st SWL Appointment.   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 Pt states she has a lot of fluid. Pt states has been drinking [employer] brand of powdered lemonade and some water; has not been tracking. Pt states she is eating 3 meals a day. Pt states she does not like to go out in public and complete daily tasks such a grocery shopping because she's slow and does not want people to get irritated with her. Pt states she cannot eat a lot of Vitamin K due to blood thinner medications.    MEDICATIONS: See list   DIETARY INTAKE:  24-hr recall:  First Meal: 2 packs of oatmeal  Snack: sometimes goldfish or crackers Second Meal: frozen bag - green beans, red potatoes Snack: none Third Meal: tomato, chicken noodle, cream of chicken soup and crackers Snack: none Beverages: powdered lemonade, water  Usual physical activity: walking 20-30 min, 3 days/week  Diet to Follow: 1600 calories 180 g carbohydrates 120 g protein 44 g fat  Preferred Learning Style:   No preference indicated   Learning Readiness:   Contemplating  Ready  Change in progress     Nutritional Diagnosis:  [place]-3.3 Overweight/obesity related to past poor dietary habits and physical inactivity as evidenced by patient w/ planned sleeve gastrectomy surgery following dietary guidelines for continued weight loss.    Intervention:  Nutrition counseling for upcoming Bariatric Surgery.  Goals:  - Aim for 150 minutes of physical activity including cardio and weight bearing every week - Track fluid intake with goal of at least 64 ounces a day.  - Continue  with physical activity regimen.  - Find at least 2 protein powder/shake options that you enjoy with at least 15 grams or more of protein and 5 grams or less of carbohydrates.  - Look into support group meetings.   Teaching Method Utilized:  Visual Auditory Hands on  Handouts given during visit include:  Bariatric Support Group schedule   Barriers to learning/adherence to lifestyle change: financial constraints  Demonstrated degree of understanding via:  Teach Back   Monitoring/Evaluation:  Dietary intake, exercise, and body weight in 1 month(s).

## 2017-06-01 NOTE — Patient Instructions (Addendum)
-   Track fluid intake with goal of at least 64 ounces a day.   - Continue with physical activity regimen.   - Find at least 2 protein powder/shake options that you enjoy with at least 15 grams or more of protein and 5 grams or less of carbohydrates.   - Look into support group meetings.

## 2017-06-05 ENCOUNTER — Other Ambulatory Visit: Payer: Self-pay | Admitting: Nurse Practitioner

## 2017-06-06 ENCOUNTER — Other Ambulatory Visit: Payer: Self-pay | Admitting: Nurse Practitioner

## 2017-06-06 DIAGNOSIS — I129 Hypertensive chronic kidney disease with stage 1 through stage 4 chronic kidney disease, or unspecified chronic kidney disease: Secondary | ICD-10-CM | POA: Diagnosis not present

## 2017-06-06 DIAGNOSIS — Z139 Encounter for screening, unspecified: Secondary | ICD-10-CM

## 2017-06-06 DIAGNOSIS — N183 Chronic kidney disease, stage 3 (moderate): Secondary | ICD-10-CM | POA: Diagnosis not present

## 2017-06-06 DIAGNOSIS — N2581 Secondary hyperparathyroidism of renal origin: Secondary | ICD-10-CM | POA: Diagnosis not present

## 2017-06-06 DIAGNOSIS — D631 Anemia in chronic kidney disease: Secondary | ICD-10-CM | POA: Diagnosis not present

## 2017-06-07 ENCOUNTER — Ambulatory Visit (INDEPENDENT_AMBULATORY_CARE_PROVIDER_SITE_OTHER): Payer: Medicare Other | Admitting: Psychiatry

## 2017-06-07 DIAGNOSIS — F509 Eating disorder, unspecified: Secondary | ICD-10-CM | POA: Diagnosis not present

## 2017-06-13 ENCOUNTER — Ambulatory Visit: Payer: Medicare Other | Admitting: Diagnostic Neuroimaging

## 2017-06-15 ENCOUNTER — Ambulatory Visit (INDEPENDENT_AMBULATORY_CARE_PROVIDER_SITE_OTHER): Payer: Medicare Other | Admitting: *Deleted

## 2017-06-15 ENCOUNTER — Encounter: Payer: Self-pay | Admitting: Pulmonary Disease

## 2017-06-15 ENCOUNTER — Ambulatory Visit (INDEPENDENT_AMBULATORY_CARE_PROVIDER_SITE_OTHER): Payer: Medicare Other | Admitting: Pulmonary Disease

## 2017-06-15 VITALS — BP 124/74 | HR 64 | Ht 65.0 in | Wt 337.0 lb

## 2017-06-15 DIAGNOSIS — Z5181 Encounter for therapeutic drug level monitoring: Secondary | ICD-10-CM | POA: Diagnosis not present

## 2017-06-15 DIAGNOSIS — Z952 Presence of prosthetic heart valve: Secondary | ICD-10-CM | POA: Diagnosis not present

## 2017-06-15 DIAGNOSIS — G4733 Obstructive sleep apnea (adult) (pediatric): Secondary | ICD-10-CM | POA: Diagnosis not present

## 2017-06-15 DIAGNOSIS — J45909 Unspecified asthma, uncomplicated: Secondary | ICD-10-CM

## 2017-06-15 DIAGNOSIS — I350 Nonrheumatic aortic (valve) stenosis: Secondary | ICD-10-CM | POA: Diagnosis not present

## 2017-06-15 DIAGNOSIS — J849 Interstitial pulmonary disease, unspecified: Secondary | ICD-10-CM

## 2017-06-15 DIAGNOSIS — J9611 Chronic respiratory failure with hypoxia: Secondary | ICD-10-CM | POA: Diagnosis not present

## 2017-06-15 DIAGNOSIS — I5031 Acute diastolic (congestive) heart failure: Secondary | ICD-10-CM

## 2017-06-15 LAB — POCT INR: INR: 2.4

## 2017-06-15 MED ORDER — ALBUTEROL SULFATE (2.5 MG/3ML) 0.083% IN NEBU: 2.5000 mg | INHALATION_SOLUTION | RESPIRATORY_TRACT | 12 refills | Status: DC | PRN

## 2017-06-15 MED ORDER — ALBUTEROL SULFATE (2.5 MG/3ML) 0.083% IN NEBU: 2.5000 mg | INHALATION_SOLUTION | RESPIRATORY_TRACT | 5 refills | Status: DC | PRN

## 2017-06-15 NOTE — Addendum Note (Signed)
Addended by: Lorretta Harp on: 06/15/2017 11:31 AM   Modules accepted: Orders

## 2017-06-15 NOTE — Patient Instructions (Signed)
Description   Today Jan 16th take 1 tablet (7.5mg ) then continue taking 1/2 tablet every day except 1 tablet on Sundays, Tuesdays and Thursdays . Recheck in 3 weeks. Coumadin Clinic # [phone].

## 2017-06-15 NOTE — Progress Notes (Signed)
Subjective:    Patient ID: [name], female    DOB: Apr 27, 1961, 58 y.o.   MRN: [id number]  Synopsis: former patient of Dr. 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  She started having symptoms prior to 2014 and around that time she had a CT scan of her chest which showed innumerable micronodules and mosaicism. At that time she was noted to have mild to moderate mitral regurgitation and moderate aortic stenosis. She had a bronchoscopy which was positive for Haemophilus influenza that was negative. Lung function testing at that time showed evidence of air trapping. Her symptoms progressed and she is having an open lung biopsy in 2015 which showed large numbers of pigmented macrophages filling alveoli with brown granules consistent with hemosiderin. There is no vasculitis or acute inflammation. There is minimal subpleural fibrosis. This is reviewed by Dr. Dian Situ in [place]. In March 2016 she underwent an aortic and mitral valve replacement. Her symptoms seem to have stabilized after that. Notably, she is a former smoker and she quit smoking in 2015.    HPI Chief Complaint  Patient presents with  . Follow-up    Pt states she has been doing good since last visit. States she still becomes SOB with exertion. Denies any cough or CP.   [name] is here for a follow up.  Obesity: > she is taking nutrition classes > she is planning on having bariatric surgery  ILD/Asthma: > she is wearing oxygen 3Lpm when walking, 2L PM at rest > she sleeps on 2L  > she is still taking Symbicort > she denies chest tightnes,s wheezing > had a flu shot  Congestive heart failure: She was hospitalized for congestive heart failure in the summer 2018.  Nocturnal hypoxemia: She continues to sleep on 2 L nasal cannula She tells me she had a sleep study in October which was negative.     Past Medical History:  Diagnosis Date  .  Allergic rhinitis   . Anemia   . Anxiety   . Aortic stenosis   . Arthritis    "lower back" (11/30/2016)  . CAO (chronic airflow obstruction) (HCC)   . Cellulitis of left lower extremity 11/30/2016  . Depressive disorder   . Gout   . History of blood transfusion 03/2016   "I was anemic"  . HTN (hypertension)   . Hyperlipidemia   . Hypertriglyceridemia   . Kidney disease    Dr. Graylon Gunning  . Menopausal symptoms   . Mitral and aortic heart valve diseases, unspecified 07/2014   s/p AVR with #19 [employer] Jude and s/p MVR with 54mm [employer] Jude per [name] at [facility]  . Mitral stenosis   . Morbid obesity ([place])   . Noninfectious lymphedema   . On home oxygen therapy    "2-3L when I'm up doing a whole lot" (11/30/2016)  . Renal insufficiency   . Shortness of breath    with exertion  . Sleep apnea   . Undiagnosed cardiac murmurs   . Vitamin D deficiency       Review of Systems  Constitutional: Negative for chills, fatigue and fever.  HENT: Negative for nosebleeds, postnasal drip and rhinorrhea.   Respiratory: Negative for cough, shortness of breath and wheezing.   Cardiovascular: Positive for leg swelling. Negative for chest pain and palpitations.       Objective:   Physical Exam Vitals:   06/15/17 1100  BP: 124/74  Pulse:  64  SpO2: 92%  Weight: (!) 337 lb (152.9 kg)  Height: 5\' 5"  (1.651 m)  RA   Gen: Obese but well appearing HENT: OP clear, TM's clear, neck supple PULM: CTA B, normal percussion CV: RRR, mechanical S1, no mgr, trace edema GI: BS+, soft, nontender Derm: no cyanosis or rash Psyche: normal mood and affect   CBC    Component Value Date/Time   WBC 10.0 02/04/2017 0208   RBC 5.33 (H) 02/04/2017 0208   HGB 16.5 (H) 02/04/2017 0208   HGB 14.9 01/10/2017 1520   HCT 51.6 (H) 02/04/2017 0208   HCT 47.3 (H) 01/10/2017 1520   PLT 180 02/04/2017 0208   PLT 194 01/10/2017 1520   MCV 96.8 02/04/2017 0208   MCV 95 01/10/2017 1520   MCH 31.0 02/04/2017 0208   MCHC  32.0 02/04/2017 0208   RDW 17.0 (H) 02/04/2017 0208   RDW 17.7 (H) 01/10/2017 1520   LYMPHSABS 1.3 02/04/2017 0208   MONOABS 1.2 (H) 02/04/2017 0208   EOSABS 0.3 02/04/2017 0208   BASOSABS 0.0 02/04/2017 0208    PFT: November 2016 pulmonary function testing ratio 77%, FEV1 1.74 L (65% predicted), FVC 2.27 L (66% predicted), total lung capacity 4.58 L (90% predicted), DLCO 13.18 (54% predicted). May 2017 pulmonary function testing normal ratio, FEV1 1.60 L, FVC 1.98 L (58% predicted), total lung capacity 4.56 L (90% predicted), residual volume 2.54 (133% predicted). DLCO 15.75 (64% predicted).  Chest imaging: 2016 CT chest> mosaicism, diffuse mild groundglass 2018 chest x-ray images independently reviewed showing acute pulmonary edema  Sleep study: October 2018 performed by Dr. Radford Pax: No significant obstructive sleep apnea, O2 saturation lowest was 87% on 2 L nasal cannula     Assessment & Plan:   ILD (interstitial lung disease) ([place]) - Plan: Pulmonary function test, 6 minute walk  Acute diastolic heart failure ([place])  Intrinsic asthma  Chronic respiratory failure with hypoxia ([place])  Obstructive sleep apnea  Discussion: [name] has gained significant amounts of weight since the last visit.  I agree completely with the plans for bariatric surgery.  From my standpoint she should be a reasonable surgical candidate in terms of pulmonary risk but we will repeat a lung function test and if necessary repeat a CT scan of the chest prior to surgery.  Her asthma has been stable lately on Symbicort.  There is no reason to change this.  Her oxygen is dosed appropriately at this time.  She does not have sleep apnea.  Plan: Moderate persistent asthma: Continue Symbicort 2 puffs twice a day no matter how you feel Use albuterol as needed for chest tightness wheezing or shortness of breath I am glad you had a flu shot already Practice good hand hygiene this time a year  History of  interstitial lung disease: You have something called nonspecific interstitial pneumonitis which was caused by persistent valvular disease and pulmonary edema.  Now that she had valve surgery this is not worsened. We will monitor it with a repeat lung function test If the lung function test is worse than we will check a CT scan of your chest  Chronic respiratory failure with hypoxemia: Continue 2 L of oxygen at rest, 3 L with exertion, 2 L with sleep  Obesity: I agree completely with your plans to have bariatric surgery  I will see you back in May 2019 prior to surgery.    s Current Outpatient Medications:  .  acetaminophen (TYLENOL) 650 MG CR tablet, Take 1,300 mg by  mouth every 8 (eight) hours as needed for pain. , Disp: , Rfl:  .  albuterol (PROVENTIL HFA;VENTOLIN HFA) 108 (90 Base) MCG/ACT inhaler, Inhale 2 puffs into the lungs 2 (two) times daily as needed for wheezing or shortness of breath. Reported on 06/20/2015, Disp: 3.7 g, Rfl: 5 .  albuterol (PROVENTIL) (2.5 MG/3ML) 0.083% nebulizer solution, Take 3 mLs (2.5 mg total) by nebulization every 4 (four) hours as needed for wheezing or shortness of breath., Disp: 150 mL, Rfl: 5 .  allopurinol (ZYLOPRIM) 300 MG tablet, Take 300 mg by mouth at bedtime. , Disp: , Rfl:  .  aspirin EC 81 MG tablet, Take 81 mg by mouth daily., Disp: , Rfl:  .  buPROPion (WELLBUTRIN XL) 300 MG 24 hr tablet, Take 300 mg by mouth daily. , Disp: , Rfl:  .  cetirizine (ZYRTEC) 10 MG tablet, Take 1 tablet (10 mg total) by mouth daily. (Patient taking differently: Take 10 mg by mouth at bedtime. ), Disp: 30 tablet, Rfl: 5 .  Cholecalciferol (VITAMIN D3) 2000 UNITS TABS, Take 2,000 Units by mouth daily. , Disp: , Rfl:  .  citalopram (CELEXA) 20 MG tablet, Take 20 mg by mouth daily. , Disp: , Rfl:  .  furosemide (LASIX) 40 MG tablet, TAKE 3 TABLETS BY MOUTH IN THE AM AND 2 TABLETS IN THE PM, Disp: 150 tablet, Rfl: 2 .  metoprolol (LOPRESSOR) 50 MG tablet, Take 50 mg  by mouth 2 (two) times daily., Disp: , Rfl:  .  montelukast (SINGULAIR) 10 MG tablet, Take 10 mg by mouth daily. , Disp: , Rfl:  .  OXYGEN, Inhale 2.5-3 L into the lungs continuous. , Disp: , Rfl:  .  potassium chloride SA (KLOR-CON M20) 20 MEQ tablet, Take 1 tablet (20 mEq total) by mouth 2 (two) times daily., Disp: 90 tablet, Rfl: 0 .  pravastatin (PRAVACHOL) 80 MG tablet, Take 80 mg by mouth daily. , Disp: , Rfl:  .  SYMBICORT 80-4.5 MCG/ACT inhaler, INHALE 2 PUFFS INTO THE LUNGS 2 (TWO) TIMES DAILY., Disp: 10.2 g, Rfl: 5 .  triamcinolone (NASACORT AQ) 55 MCG/ACT AERO nasal inhaler, Place 2 sprays into the nose daily. (Patient taking differently: Place 2 sprays into the nose daily as needed (for seasonal allergies). ), Disp: 1 Inhaler, Rfl: 5 .  warfarin (COUMADIN) 7.5 MG tablet, TAKE 1 TABLET BY MOUTH DAILY OR AS DIRECTED BY COUMADIN CLINIC, Disp: 90 tablet, Rfl: 0 .  losartan (COZAAR) 50 MG tablet, Take 1 tablet (50 mg total) by mouth daily., Disp: 90 tablet, Rfl: 3 .  losartan (COZAAR) 50 MG tablet, TAKE 1 TABLET (50 MG TOTAL) BY MOUTH DAILY., Disp: , Rfl: 3

## 2017-06-15 NOTE — Patient Instructions (Signed)
Moderate persistent asthma: Continue Symbicort 2 puffs twice a day no matter how you feel Use albuterol as needed for chest tightness wheezing or shortness of breath I am glad you had a flu shot already Practice good hand hygiene this time a year  History of interstitial lung disease: You have something called nonspecific interstitial pneumonitis which was caused by persistent valvular disease and pulmonary edema.  Now that she had valve surgery this is not worsened. We will monitor it with a repeat lung function test If the lung function test is worse than we will check a CT scan of your chest  Chronic respiratory failure with hypoxemia: Continue 2 L of oxygen at rest, 3 L with exertion, 2 L with sleep  Obesity: I agree completely with your plans to have bariatric surgery  I will see you back in May 2019 prior to surgery.

## 2017-06-20 ENCOUNTER — Other Ambulatory Visit: Payer: Self-pay

## 2017-06-20 MED ORDER — ALBUTEROL SULFATE (2.5 MG/3ML) 0.083% IN NEBU: 2.5000 mg | INHALATION_SOLUTION | RESPIRATORY_TRACT | 12 refills | Status: DC | PRN

## 2017-06-21 ENCOUNTER — Ambulatory Visit: Payer: Medicare Other | Admitting: Diagnostic Neuroimaging

## 2017-07-06 ENCOUNTER — Encounter: Payer: Medicare Other | Attending: General Surgery | Admitting: Registered"

## 2017-07-06 ENCOUNTER — Ambulatory Visit (INDEPENDENT_AMBULATORY_CARE_PROVIDER_SITE_OTHER): Payer: Medicare Other | Admitting: *Deleted

## 2017-07-06 ENCOUNTER — Encounter: Payer: Self-pay | Admitting: Registered"

## 2017-07-06 DIAGNOSIS — I350 Nonrheumatic aortic (valve) stenosis: Secondary | ICD-10-CM | POA: Diagnosis not present

## 2017-07-06 DIAGNOSIS — Z5181 Encounter for therapeutic drug level monitoring: Secondary | ICD-10-CM | POA: Diagnosis not present

## 2017-07-06 DIAGNOSIS — Z713 Dietary counseling and surveillance: Secondary | ICD-10-CM | POA: Diagnosis not present

## 2017-07-06 DIAGNOSIS — Z6841 Body Mass Index (BMI) 40.0 and over, adult: Secondary | ICD-10-CM | POA: Diagnosis not present

## 2017-07-06 DIAGNOSIS — Z952 Presence of prosthetic heart valve: Secondary | ICD-10-CM

## 2017-07-06 DIAGNOSIS — E669 Obesity, unspecified: Secondary | ICD-10-CM

## 2017-07-06 LAB — POCT INR: INR: 2.7

## 2017-07-06 NOTE — Progress Notes (Signed)
Sleeve Gastrectomy Appt start time: 8:45 end time: 9:01  Assessment: 2nd SWL Appointment.   Start Wt at N[facility]: 326.8 Wt: 332.6 BMI: 55.35   Co-morbidities: chronic diastolic heart failure, interstitial lung disease/asthma, chronic kidney disease, hypertension, on chronic oxygen, valvular heart disease status post aortic and mitral valve replacement on chronic Coumadin  Pt arrives having lost about 11.6 lbs from previous visit. Pt states she is drinking more than 64 ounces of fluid a day. Pt states she and sister share a car currently because she gave her car to her daughter; will have to find out when sister can take her support group. Pt states they have been doing ok with having enough food since previous visit. Pt states they have reduced their spending in food. Pt states she she went to the store to compare protein shakes but did not purchase. Pt was given a coupon for for Premier Protein to try a flavor. Pt states when she had a heart valve replaced she was told she could not have a lot of calcium.   Pt states she has a lot of fluid.Pt states she is eating 3 meals a day. Pt states she does not like to go out in public and complete daily tasks such as grocery shopping because she's slow and does not want people to get irritated with her. Pt states she cannot eat a lot of Vitamin K due to blood thinner medications.    MEDICATIONS: See list   DIETARY INTAKE:  24-hr recall:  First Meal: 2 packs of oatmeal  Snack: sometimes goldfish or crackers Second Meal: frozen bag - green beans, red potatoes Snack: none Third Meal: tomato, chicken noodle, cream of chicken soup and crackers Snack: none Beverages: powdered lemonade, water  Usual physical activity: walking 20-30 min, 3 days/week  Diet to Follow: 1600 calories 180 g carbohydrates 120 g protein 44 g fat  Preferred Learning Style:   No preference indicated   Learning Readiness:   Contemplating  Ready  Change in  progress     Nutritional Diagnosis:  [place]-3.3 Overweight/obesity related to past poor dietary habits and physical inactivity as evidenced by patient w/ planned sleeve gastrectomy surgery following dietary guidelines for continued weight loss.    Intervention:  Nutrition counseling for upcoming Bariatric Surgery.  Goals:  - Aim for 150 minutes of physical activity including cardio and weight bearing every week - Try at least 1 flavor of Premier Protein to see if you like it.  - Check into local food banks/pantries with monthly food supply.  - Aim to chew at least 30 times per bite or to applesauce consistency. Aim to listen to body for when it is satisfied with amount eaten.   Teaching Method Utilized:  Visual Auditory Hands on  Handouts given during visit include:  Local food pantries  Barriers to learning/adherence to lifestyle change: financial constraints  Demonstrated degree of understanding via:  Teach Back   Monitoring/Evaluation:  Dietary intake, exercise, and body weight in 1 month(s).

## 2017-07-06 NOTE — Patient Instructions (Signed)
Description   Continue taking 1/2 tablet every day except 1 tablet on Sundays, Tuesdays and Thursdays . Recheck in 4 weeks. [facility] # [phone].

## 2017-07-06 NOTE — Patient Instructions (Addendum)
-   Try at least 1 flavor of Premier Protein to see if you like it.   - Check into local food banks/pantries with monthly food supply.   - Aim to chew at least 30 times per bite or to applesauce consistency. Aim to listen to body for when it is satisfied with amount eaten.

## 2017-08-03 ENCOUNTER — Ambulatory Visit (INDEPENDENT_AMBULATORY_CARE_PROVIDER_SITE_OTHER): Payer: Medicare Other | Admitting: *Deleted

## 2017-08-03 ENCOUNTER — Ambulatory Visit
Admission: RE | Admit: 2017-08-03 | Discharge: 2017-08-03 | Disposition: A | Discharge status: Home / Self Care | Payer: Medicare Other | Source: Ambulatory Visit | Attending: Nurse Practitioner | Admitting: Nurse Practitioner

## 2017-08-03 ENCOUNTER — Encounter: Payer: Medicare Other | Attending: General Surgery | Admitting: Registered"

## 2017-08-03 ENCOUNTER — Encounter: Payer: Self-pay | Admitting: Registered"

## 2017-08-03 DIAGNOSIS — I350 Nonrheumatic aortic (valve) stenosis: Secondary | ICD-10-CM

## 2017-08-03 DIAGNOSIS — Z6841 Body Mass Index (BMI) 40.0 and over, adult: Secondary | ICD-10-CM | POA: Diagnosis not present

## 2017-08-03 DIAGNOSIS — Z5181 Encounter for therapeutic drug level monitoring: Secondary | ICD-10-CM

## 2017-08-03 DIAGNOSIS — E669 Obesity, unspecified: Secondary | ICD-10-CM

## 2017-08-03 DIAGNOSIS — Z713 Dietary counseling and surveillance: Secondary | ICD-10-CM | POA: Insufficient documentation

## 2017-08-03 DIAGNOSIS — Z1231 Encounter for screening mammogram for malignant neoplasm of breast: Secondary | ICD-10-CM | POA: Diagnosis not present

## 2017-08-03 DIAGNOSIS — Z139 Encounter for screening, unspecified: Secondary | ICD-10-CM

## 2017-08-03 LAB — POCT INR: INR: 2.4

## 2017-08-03 NOTE — Patient Instructions (Addendum)
-   Look into getting multivitamin complete. Any brand.   - Continue to work on chewing well.

## 2017-08-03 NOTE — Progress Notes (Signed)
Sleeve Gastrectomy Appt start time: 8:30 end time: 8:45  Assessment: 3rd SWL Appointment.   Start Wt at NDES: 326.8 Wt: 335.8 BMI: 55.88   Co-morbidities: chronic diastolic heart failure, interstitial lung disease/asthma, chronic kidney disease, hypertension, on chronic oxygen, valvular heart disease status post aortic and mitral valve replacement on chronic Coumadin  Pt arrives having gained about 3.2 lbs from previous visit. Pt states she enjoys chocolate and vanilla premier protein shakes. Pt states she has been chewing well. Pt states she needs dentures for her top gum line and believes that will help with chewing. Pt states she does not currently have restrictions as to what she can eat and chew. Pt states she plans to get dentures after having surgery tro help with chewing. Pt states she will consult with cardiologist about calcium supplement requirements after she has surgery. Pt states she takes a Vitamin D supplement but needs to add multivitamin.   Pt states she is drinking more than 64 ounces of fluid a day. Pt states she and sister share a car currently because she gave her car to her daughter; will have to find out when sister can take her support group. Pt states they have been doing ok with having enough food since previous visit. Pt states they have reduced their spending in food. Pt states when she had a heart valve replaced she was told she could not have a lot of calcium.   Pt states she has a lot of fluid.Pt states she is eating 3 meals a day. Pt states she does not like to go out in public and complete daily tasks such as grocery shopping because she's slow and does not want people to get irritated with her. Pt states she cannot eat a lot of Vitamin K due to blood thinner medications.    MEDICATIONS: See list   DIETARY INTAKE:  24-hr recall:  First Meal: 2 packs of oatmeal  Snack: sometimes goldfish or crackers Second Meal: frozen bag - green beans, red  potatoes Snack: none Third Meal: tomato, chicken noodle, cream of chicken soup and crackers Snack: none Beverages: powdered lemonade, water  Usual physical activity: walking 20-30 min, 3 days/week  Diet to Follow: 1600 calories 180 g carbohydrates 120 g protein 44 g fat  Preferred Learning Style:   No preference indicated   Learning Readiness:   Contemplating  Ready  Change in progress     Nutritional Diagnosis:  [place]-3.3 Overweight/obesity related to past poor dietary habits and physical inactivity as evidenced by patient w/ planned sleeve gastrectomy surgery following dietary guidelines for continued weight loss.    Intervention:  Nutrition counseling for upcoming Bariatric Surgery.  Goals:  - Aim for 150 minutes of physical activity including cardio and weight bearing every week - Look into getting multivitamin complete. Any brand.  - Continue to work on chewing well.   Teaching Method Utilized:  Visual Auditory Hands on  Handouts given during visit include:  none  Barriers to learning/adherence to lifestyle change: financial constraints  Demonstrated degree of understanding via:  Teach Back   Monitoring/Evaluation:  Dietary intake, exercise, and body weight in 1 month(s).

## 2017-08-03 NOTE — Patient Instructions (Signed)
Description   Today take 1 tablet, then Continue taking 1/2 tablet every day except 1 tablet on Sundays, Tuesdays and Thursdays . Recheck in 4 weeks. Coumadin Clinic # [phone].

## 2017-08-18 ENCOUNTER — Other Ambulatory Visit: Payer: Self-pay | Admitting: Nurse Practitioner

## 2017-08-31 ENCOUNTER — Encounter: Payer: Medicare Other | Attending: General Surgery | Admitting: Registered"

## 2017-08-31 ENCOUNTER — Encounter: Payer: Self-pay | Admitting: Registered"

## 2017-08-31 ENCOUNTER — Ambulatory Visit (INDEPENDENT_AMBULATORY_CARE_PROVIDER_SITE_OTHER): Payer: Medicare Other | Admitting: *Deleted

## 2017-08-31 DIAGNOSIS — Z5181 Encounter for therapeutic drug level monitoring: Secondary | ICD-10-CM

## 2017-08-31 DIAGNOSIS — Z952 Presence of prosthetic heart valve: Secondary | ICD-10-CM

## 2017-08-31 DIAGNOSIS — I350 Nonrheumatic aortic (valve) stenosis: Secondary | ICD-10-CM | POA: Diagnosis not present

## 2017-08-31 DIAGNOSIS — E669 Obesity, unspecified: Secondary | ICD-10-CM

## 2017-08-31 DIAGNOSIS — Z713 Dietary counseling and surveillance: Secondary | ICD-10-CM | POA: Diagnosis not present

## 2017-08-31 DIAGNOSIS — Z6841 Body Mass Index (BMI) 40.0 and over, adult: Secondary | ICD-10-CM | POA: Diagnosis not present

## 2017-08-31 LAB — POCT INR: INR: 1.6

## 2017-08-31 NOTE — Patient Instructions (Addendum)
-   Find out information from cardiologist about calcium supplements that will be necessary on a daily basis after bariatric surgery.   - Look into getting multivitamin complete. Any brand.   - Aim to increase non-starchy vegetable intake. Have vegetables with lunch and dinner.   - Remember to keep fat and sugar in single digits when grocery shopping.

## 2017-08-31 NOTE — Progress Notes (Signed)
Sleeve Gastrectomy Appt start time: 8:30 end time: 8:45  Assessment: 4th SWL Appointment.   Start Wt at NDES: 326.8 Wt: 335.4 BMI: 55.81   Co-morbidities: chronic diastolic heart failure, interstitial lung disease/asthma, chronic kidney disease, hypertension, on chronic oxygen, valvular heart disease status post aortic and mitral valve replacement on chronic Coumadin  Pt arrives having maintained weight from previous visit. Pt states she is chewing 30 times per bite. Pt states she has not had a chance to get multivitamin complete yet; plans to get it today.   Pt states she enjoys chocolate and vanilla premier protein shakes. Pt states she has been chewing well. Pt states she needs dentures for her top gum line and believes that will help with chewing. Pt states she does not currently have restrictions as to what she can eat and chew. Pt states she plans to get dentures after having surgery to help with chewing. Pt states she will consult with cardiologist about calcium supplement requirements after she has surgery. Pt states she takes a Vitamin D supplement but needs to add multivitamin.   Pt states she is drinking more than 64 ounces of fluid a day. Pt states she and sister share a car currently because she gave her car to her daughter; will have to find out when sister can take her support group. Pt states they have been doing ok with having enough food since previous visit. Pt states they have reduced their spending in food. Pt states when she had a heart valve replaced she was told she could not have a lot of calcium.   Pt states she is eating 3 meals a day. Pt states she does not like to go out in public and complete daily tasks such as grocery shopping because she's slow and does not want people to get irritated with her. Pt states she cannot eat a lot of Vitamin K due to blood thinner medications.    MEDICATIONS: See list   DIETARY INTAKE:  24-hr recall:  First Meal: 2 packs of  oatmeal  Snack: sometimes goldfish or crackers Second Meal: frozen bag - green beans, red potatoes Snack: none Third Meal: tomato, chicken noodle, cream of chicken soup and crackers Snack: none Beverages: powdered lemonade, water  Usual physical activity: walking 20-30 min, 3 days/week  Diet to Follow: 1600 calories 180 g carbohydrates 120 g protein 44 g fat  Preferred Learning Style:   No preference indicated   Learning Readiness:   Contemplating  Ready  Change in progress     Nutritional Diagnosis:  [place]-3.3 Overweight/obesity related to past poor dietary habits and physical inactivity as evidenced by patient w/ planned sleeve gastrectomy surgery following dietary guidelines for continued weight loss.    Intervention:  Nutrition counseling for upcoming Bariatric Surgery.  Goals:  - Aim for 150 minutes of physical activity including cardio and weight bearing every week - Find out information from cardiologist about calcium supplements that will be necessary on a daily basis after bariatric surgery.  - Look into getting multivitamin complete. Any brand.  - Aim to increase non-starchy vegetable intake. Have vegetables with lunch and dinner.  - Remember to keep fat and sugar in single digits when grocery shopping.    Teaching Method Utilized:  Visual Auditory Hands on  Handouts given during visit include:  none  Barriers to learning/adherence to lifestyle change: financial constraints  Demonstrated degree of understanding via:  Teach Back   Monitoring/Evaluation:  Dietary intake, exercise, and body weight in 1  month(s).

## 2017-08-31 NOTE — Patient Instructions (Signed)
Description   Today  April 3rd take 1 tablet (7.5mg ) then tomorrow April 4th take 1 and 1/2 tablets (11.25mg ) then change coumadin dose to  1 tablet daily except 1/2 tablet on Mondays Wednesdays and Fridays  . Recheck in 2 weeks. Cou[facility]in Clinic # [phone].

## 2017-09-11 ENCOUNTER — Other Ambulatory Visit: Payer: Self-pay | Admitting: Nurse Practitioner

## 2017-09-13 ENCOUNTER — Ambulatory Visit (INDEPENDENT_AMBULATORY_CARE_PROVIDER_SITE_OTHER): Payer: Medicare Other | Admitting: *Deleted

## 2017-09-13 DIAGNOSIS — I350 Nonrheumatic aortic (valve) stenosis: Secondary | ICD-10-CM

## 2017-09-13 DIAGNOSIS — Z5181 Encounter for therapeutic drug level monitoring: Secondary | ICD-10-CM | POA: Diagnosis not present

## 2017-09-13 LAB — POCT INR: INR: 2.3

## 2017-09-13 NOTE — Patient Instructions (Signed)
Description   Today take 1.5 tablets then change coumadin dose to the dose you should be taking: 1 tablet daily except 1/2 tablet on Mondays Wednesdays and Fridays. Recheck in 2 weeks. Coumadin Clinic # [phone].

## 2017-09-21 ENCOUNTER — Ambulatory Visit (INDEPENDENT_AMBULATORY_CARE_PROVIDER_SITE_OTHER): Payer: Medicare Other | Admitting: Psychiatry

## 2017-09-21 ENCOUNTER — Encounter: Payer: Self-pay | Admitting: Nurse Practitioner

## 2017-09-21 ENCOUNTER — Ambulatory Visit (INDEPENDENT_AMBULATORY_CARE_PROVIDER_SITE_OTHER): Payer: Medicare Other | Admitting: Nurse Practitioner

## 2017-09-21 VITALS — BP 130/74 | HR 65 | Ht 64.0 in | Wt 341.8 lb

## 2017-09-21 DIAGNOSIS — Z952 Presence of prosthetic heart valve: Secondary | ICD-10-CM | POA: Diagnosis not present

## 2017-09-21 DIAGNOSIS — F509 Eating disorder, unspecified: Secondary | ICD-10-CM

## 2017-09-21 DIAGNOSIS — N189 Chronic kidney disease, unspecified: Secondary | ICD-10-CM | POA: Diagnosis not present

## 2017-09-21 DIAGNOSIS — I5032 Chronic diastolic (congestive) heart failure: Secondary | ICD-10-CM

## 2017-09-21 NOTE — Progress Notes (Signed)
CARDIOLOGY OFFICE NOTE  Date:  09/21/2017    [name] Date of Birth: 12-Jan-1961 Medical Record #[id number]  PCP:  [name], [place]  Cardiologist:  [name]    Chief Complaint  Patient presents with  . Congestive Heart Failure    4 month check - seen for [name]    History of Present Illness: [name] is a 57 y.o. female who presents today for a 4 month check.  Seen for [name].Primarily sees me.   She has a history of valvular heart disease, idiopathic pulmonary hemosiderosis, ILD, polycythemia - requiring phlebotomies, CKD, HTN, HLD and OSA.   She underwent mechanical MVR and mechanical AVR with [name] at [facility] in March of 2016. Noted from [place] - "Cath and echo showed severe AS, moderate MS, PHBP, very small LV cavity and small aortic root with preserved ventricular function and no significant CAD. The patient was taken to the OR by [name] on 3/8/206 where she underwent AVR with #9mm [place] valve, MVR with #78mm St Jude mechanical valve finding rheumatic disease. "  Post op she had AFL - treated with amiodarone - she was not able to tolerate. She was eventually cardioverted and has maintained NSR since. She would need alternative AAD therapy if has recurrence.   I have followed her over the last few years - she has had issues with severe anemia. She has had diastolic HF with some exacerbations. She remains morbidly obese - now considering weight loss surgery. She has chronic lymphedema of the right leg from remote injury. She has had a bout of Bell's palsy.  She is followed by Dr. [name] at [place] for her hematology issues - she does require periodic phlebotomy. I last saw her 4 months ago - she was doing ok - planning on having gastric sleeve hopefully later this year - [name] and I have talked about this - see our discussion below.   Comes in today. Here alone.She is seeing the nutritionist and the psychologist.  Seeing pulmonary next month. Planning on her bariatric surgery later this summer - hopefully by June. She feels well. Breathing is stable - remains on her oxygen. No chest pain. Really looking forward to having her bariatric surgery so she can be more mobile.   Past Medical History:  Diagnosis Date  . Allergic rhinitis   . Anemia   . Anxiety   . Aortic stenosis   . Arthritis    "lower back" (11/30/2016)  . CAO (chronic airflow obstruction) (HCC)   . Cellulitis of left lower extremity 11/30/2016  . Depressive disorder   . Gout   . History of blood transfusion 03/2016   "I was anemic"  . HTN (hypertension)   . Hyperlipidemia   . Hypertriglyceridemia   . Kidney disease    Dr. Graylon Gunning  . Menopausal symptoms   . Mitral and aortic heart valve diseases, unspecified 07/2014   s/p AVR with #19 St Jude and s/p MVR with 64mm St. Jude per [name] at [facility]  . Mitral stenosis   . Morbid obesity ([place])   . Noninfectious lymphedema   . On home oxygen therapy    "2-3L when I'm up doing a whole lot" (11/30/2016)  . Renal insufficiency   . Shortness of breath    with exertion  . Sleep apnea   . Undiagnosed cardiac murmurs   . Vitamin D deficiency     Past Surgical History:  Procedure Laterality Date  . AORTIC AND MITRAL VALVE REPLACEMENT  07/2014   s/p AVR with #19 St Jude and s/p MVR with 31mm St. Jude per [name] at [facility]  . CARDIAC CATHETERIZATION  07/2014  . CARDIAC VALVE REPLACEMENT    . CARDIOVERSION N/A 09/19/2014   Procedure: CARDIOVERSION;  Surgeon: Sanda Klein, MD;  Location: [facility];  Service: Cardiovascular;  Laterality: N/A;  . [place]  . LUNG BIOPSY Left 10/03/2013   Procedure: Left Lung Biopsy;  Surgeon: Melrose Nakayama, MD;  Location: [facility];  Service: Thoracic;  Laterality: Left;  . TONSILLECTOMY    . VIDEO ASSISTED THORACOSCOPY Left 10/03/2013   Procedure: Left Video Assited Thoracoscopy;  Surgeon: Melrose Nakayama, MD;  Location: [facility];   Service: Thoracic;  Laterality: Left;  Marland Kitchen VIDEO BRONCHOSCOPY Bilateral 10/25/2012   Procedure: VIDEO BRONCHOSCOPY WITH FLUORO;  Surgeon: Kathee Delton, MD;  Location: [facility];  Service: Cardiopulmonary;  Laterality: Bilateral;     Medications: Current Meds  Medication Sig  . acetaminophen (TYLENOL) 650 MG CR tablet Take 1,300 mg by mouth every 8 (eight) hours as needed for pain.   Marland Kitchen albuterol (PROVENTIL HFA;VENTOLIN HFA) 108 (90 Base) MCG/ACT inhaler Inhale 2 puffs into the lungs 2 (two) times daily as needed for wheezing or shortness of breath. Reported on 06/20/2015  . albuterol (PROVENTIL) (2.5 MG/3ML) 0.083% nebulizer solution Take 3 mLs (2.5 mg total) by nebulization every 4 (four) hours as needed for wheezing or shortness of breath.  . allopurinol (ZYLOPRIM) 300 MG tablet Take 300 mg by mouth at bedtime.   Marland Kitchen aspirin EC 81 MG tablet Take 81 mg by mouth daily.  Marland Kitchen buPROPion (WELLBUTRIN XL) 300 MG 24 hr tablet Take 300 mg by mouth daily.   . cetirizine (ZYRTEC) 10 MG tablet Take 1 tablet (10 mg total) by mouth daily. (Patient taking differently: Take 10 mg by mouth at bedtime. )  . Cholecalciferol (VITAMIN D3) 2000 UNITS TABS Take 2,000 Units by mouth daily.   . citalopram (CELEXA) 20 MG tablet Take 20 mg by mouth daily.   . furosemide (LASIX) 40 MG tablet TAKE 3 TABLETS BY MOUTH IN THE AM AND 2 TABLETS IN THE PM  . losartan (COZAAR) 50 MG tablet Take 1 tablet (50 mg total) by mouth daily.  . metoprolol (LOPRESSOR) 50 MG tablet Take 50 mg by mouth 2 (two) times daily.  . montelukast (SINGULAIR) 10 MG tablet Take 10 mg by mouth daily.   . OXYGEN Inhale 2.5-3 L into the lungs continuous.   . potassium chloride SA (KLOR-CON M20) 20 MEQ tablet Take 1 tablet (20 mEq total) by mouth 2 (two) times daily.  . pravastatin (PRAVACHOL) 80 MG tablet Take 80 mg by mouth daily.   . SYMBICORT 80-4.5 MCG/ACT inhaler INHALE 2 PUFFS INTO THE LUNGS 2 (TWO) TIMES DAILY.  Marland Kitchen triamcinolone (NASACORT AQ) 55  MCG/ACT AERO nasal inhaler Place 2 sprays into the nose daily. (Patient taking differently: Place 2 sprays into the nose daily as needed (for seasonal allergies). )  . warfarin (COUMADIN) 7.5 MG tablet TAKE 1 TABLET BY MOUTH DAILY OR AS DIRECTED BY COUMADIN CLINIC    Allergies: No Known Allergies  Social History: The patient  reports that she quit smoking about 3 years ago. Her smoking use included cigarettes. She has a 35.00 pack-year smoking history. She has never used smokeless tobacco. She reports that she does not drink alcohol or use drugs.   Family History:  The patient's family history includes Cancer in her mother; Dementia in her mother; Diabetes in her brother; Emphysema in her mother; Heart disease in her father; Hypertension in her brother, father, mother, and sister; Kidney disease in her father; Kidney failure in her father; Stroke in her brother.   Review of Systems: Please see the history of present illness.   Otherwise, the review of systems is positive for none.   All other systems are reviewed and negative.   Physical Exam: VS:  BP 130/74 (BP Location: Left Arm, Patient Position: Sitting, Cuff Size: Large)   Pulse 65   Ht 5\' 4"  (1.626 m)   Wt (!) 341 lb 12.8 oz (155 kg)   BMI 58.67 kg/m  .  BMI Body mass index is 58.67 kg/m.  Wt Readings from Last 3 Encounters:  09/21/17 (!) 341 lb 12.8 oz (155 kg)  08/31/17 (!) 335 lb 6.4 oz (152.1 kg)  08/03/17 (!) 335 lb 12.8 oz (152.3 kg)    General: Pleasant. Morbidly obese. Alert and in no acute distress.   HEENT: Normal. Missing teeth Neck: Supple, no JVD, carotid bruits, or masses noted.  Cardiac: Regular rate and rhythm. Heart sounds are distant. Chronic edema of the right leg.  Respiratory:  Lungs are clear to auscultation bilaterally with normal work of breathing. She has her oxygen in place.  GI: Soft and nontender.  MS: No deformity or atrophy. Gait and ROM intact.  Skin: Warm and dry. Color is normal.  Neuro:   Strength and sensation are intact and no gross focal deficits noted.  Psych: Alert, appropriate and with normal affect.   LABORATORY DATA:  EKG:  EKG is ordered today. This demonstrates NSR with 1st degree AV block.  Lab Results  Component Value Date   WBC 10.0 02/04/2017   HGB 16.5 (H) 02/04/2017   HCT 51.6 (H) 02/04/2017   PLT 180 02/04/2017   GLUCOSE 112 (H) 02/22/2017   CHOL 140 01/13/2017   TRIG 138 01/13/2017   HDL 43 01/13/2017   LDLCALC 69 01/13/2017   ALT 17 01/10/2017   AST 24 01/10/2017   NA 140 02/22/2017   K 4.4 02/22/2017   CL 96 02/22/2017   CREATININE 1.39 (H) 02/22/2017   BUN 23 02/22/2017   CO2 26 02/22/2017   TSH 3.57 09/18/2014   INR 2.3 09/13/2017     BNP (last 3 results) Recent Labs    11/30/16 1326  BNP 221.3*    ProBNP (last 3 results) Recent Labs    01/10/17 [place]*     Other Studies Reviewed Today:  Echo Study Conclusions 11/2016  - Left ventricle: The cavity size was normal. Wall thickness was increased in a pattern of moderate LVH. Systolic function was vigorous. The estimated ejection fraction was in the range of 65% to 70%. - Aortic valve: AV prosthesis is well seated Peak and mean gradients through the valve atr 47 and 25 mm Hg respectively These gradients are lower than those reported in echo of March 2018 - Mitral valve: MV prosthesis is diffiuclt to see well Peak and mean gradients through the valve are 18 and 8 mm Hg respectively. MVA by P T1/2 is 1.8 cm2 No significant MR. No significant change in gradients from echo in March 2018. Valve area by pressure half-time: 1.8 cm^2. - Pulmonary arteries: PA peak pressure: 43 mm Hg (S).   Assessment/Plan:  1. Chronic diastolic CHF - stable clinically. No changes made today.   2.Chronic shortness  of breath/hypoxia/ILD -Uses home oxygen 3L Her sleep study was negative for OSA.  Breathing is stable.   3.CKD  4.Atrial  flutter -Pt had aflutter after valve surgery and was on amiodarone for a time. Notes indicate that if pt needs further antiarrhythmic, would need to be different.  -Cardioverted 08/2014 and has maintained sinus rhythm since. She remains on beta blocker. She remains in sinus rhythm with 1st degree AVB today    5.Valvular heart disease S/P AVR and MVR (mechanical valves) -Per her post-op echos her valves seem to be small for her size. She remains on her coumadin.   6.Essential hypertension -BP looks good here today - no changes made. She is having labs later this week.   7.Hyperlipidemia -On statin therapy.   8.Morbid obesity: she is planning to proceed with bariatric surgery - already discussed with [name] previously.  She agrees that this is for medical reasons and understands that she will be at increased risk. [name] and I both already agree that overall she will be at elevated risk for surgery given her multiple co-morbidities. She will be at risk for recurrent AF/flutter. She will be at increased risk for bleeding due to anticoagulation. She is also at increased risk for embolic event (2 mechanical valves - especially mechanical mitral valve). She will need bridging with Lovenox up to 12 hours prior to surgery. Would recommend trying to restart/bridge within 48 hours after surgery. Cardiology will need to be following during her time in the hospital. She is to let us and the coumadin clinic know the date of her surgery.   This was reviewed with her today again in detail. She is aware and is willing to proceed. Would like to see her back in July for her post hospital visit.   Current medicines are reviewed with the patient today.  The patient does not have concerns regarding medicines other than what has been noted above.  The following changes have been made:  See above.  Labs/ tests ordered today include:    Orders Placed This Encounter  Procedures  . EKG 12-Lead      Disposition:   FU with me in July.  Patient is agreeable to this plan and will call if any problems develop in the interim.   SignedTruitt Merle, NP  09/21/2017 9:26 AM  [facility] [address] [place], [place]  [address] Phone: [phone] Fax: [phone]

## 2017-09-21 NOTE — Patient Instructions (Addendum)
We will be checking the following labs today - NONE   Medication Instructions:    Continue with your current medicines.     Testing/Procedures To Be Arranged:  N/A  Follow-Up:   See me in July    Other Special Instructions:   N/A    If you need a refill on your cardiac medications before your next appointment, please call your pharmacy.   Call the [place] office at [phone] if you have any questions, problems or concerns.

## 2017-09-28 ENCOUNTER — Encounter: Payer: Self-pay | Admitting: Registered"

## 2017-09-28 ENCOUNTER — Ambulatory Visit (INDEPENDENT_AMBULATORY_CARE_PROVIDER_SITE_OTHER): Payer: Medicare Other | Admitting: *Deleted

## 2017-09-28 ENCOUNTER — Encounter: Payer: Medicare Other | Attending: General Surgery | Admitting: Registered"

## 2017-09-28 DIAGNOSIS — Z6841 Body Mass Index (BMI) 40.0 and over, adult: Secondary | ICD-10-CM | POA: Diagnosis not present

## 2017-09-28 DIAGNOSIS — H25813 Combined forms of age-related cataract, bilateral: Secondary | ICD-10-CM | POA: Diagnosis not present

## 2017-09-28 DIAGNOSIS — Z713 Dietary counseling and surveillance: Secondary | ICD-10-CM | POA: Insufficient documentation

## 2017-09-28 DIAGNOSIS — E669 Obesity, unspecified: Secondary | ICD-10-CM

## 2017-09-28 DIAGNOSIS — Z5181 Encounter for therapeutic drug level monitoring: Secondary | ICD-10-CM

## 2017-09-28 DIAGNOSIS — I350 Nonrheumatic aortic (valve) stenosis: Secondary | ICD-10-CM

## 2017-09-28 DIAGNOSIS — Z952 Presence of prosthetic heart valve: Secondary | ICD-10-CM

## 2017-09-28 LAB — POCT INR: INR: 3.2

## 2017-09-28 NOTE — Patient Instructions (Addendum)
-   Look into getting multivitamin complete. Any brand.

## 2017-09-28 NOTE — Progress Notes (Signed)
Sleeve Gastrectomy Appt start time: 8:35 end time: 8:55  Assessment: 5th SWL Appointment.   Start Wt at NDES: 326.8 Wt: 337.3 BMI: 57.90   Co-morbidities: chronic diastolic heart failure, interstitial lung disease/asthma, chronic kidney disease, hypertension, on chronic oxygen, valvular heart disease status post aortic and mitral valve replacement on chronic Coumadin  Pt arrives having lost 1.9 lbs from previous visit. Pt states she is chewing 30 times per bite. Pt states she has not had a chance to get multivitamin complete yet again; ongoing goal from previous 2 visits. Pt states she and her sister are having some financial concerns and struggle with purchasing food at times. Pt states they are working through them. Pt was reminded of financial commitment that comes with bariatric surgery to maintain vitamin and mineral adequacy. Pt states she understands. Pt states she is doing better with having vegetables at meal time. Pt states she is still struggling with financial and having the food for meals. Pt states she does not eat after 6pm. Pt states she is doing well with keeping fat and sugar in single digits per serving. Pt states she talked with cardiologist and needs to follow-up with her 4-6 weeks after surgery. Pt states cardiologist told her her will not need to take calcium until they follow-up with her after seeing her lab values.   Pt states she enjoys chocolate and vanilla premier protein shakes. Pt states she has been chewing well. Pt states she needs dentures for her top gum line and believes that will help with chewing. Pt states she does not currently have restrictions as to what she can eat and chew. Pt states she plans to get dentures after having surgery to help with chewing. Pt states she takes a Vitamin D supplement but needs to add multivitamin.   Pt states she is drinking more than 64 ounces of fluid a day. Pt states she and sister share a car currently because she gave her car  to her daughter; will have to find out when sister can take her support group. Pt states they have been doing ok with having enough food since previous visit. Pt states they have reduced their spending in food. Pt states when she had a heart valve replaced she was told she could not have a lot of calcium.   Pt states she is eating 3 meals a day. Pt states she does not like to go out in public and complete daily tasks such as grocery shopping because she's slow and does not want people to get irritated with her. Pt states she cannot eat a lot of Vitamin K due to blood thinner medications.    MEDICATIONS: See list   DIETARY INTAKE:  24-hr recall:  First Meal: yogurt, banana Snack: sometimes goldfish or crackers Second Meal: [place], cheese sandwich  Snack: none Third Meal: black beans, black olives, peppers, [place] pepperoni, ranch  Snack: none Beverages: powdered lemonade, water  Usual physical activity: walking 20-30 min, 3-4 days/week  Diet to Follow: 1600 calories 180 g carbohydrates 120 g protein 44 g fat  Preferred Learning Style:   No preference indicated   Learning Readiness:   Contemplating  Ready  Change in progress     Nutritional Diagnosis:  [place]-3.3 Overweight/obesity related to past poor dietary habits and physical inactivity as evidenced by patient w/ planned sleeve gastrectomy surgery following dietary guidelines for continued weight loss.    Intervention:  Nutrition counseling for upcoming Bariatric Surgery.  Goals:  - Aim for 150 minutes  of physical activity including cardio and weight bearing every week - Look into getting multivitamin complete. Any brand.   Teaching Method Utilized:  Visual Auditory Hands on  Handouts given during visit include:  none  Barriers to learning/adherence to lifestyle change: financial constraints  Demonstrated degree of understanding via:  Teach Back   Monitoring/Evaluation:  Dietary intake, exercise, and body  weight in 1 month(s).

## 2017-09-28 NOTE — Patient Instructions (Signed)
Description   Continue same dose of  coumadin  1 tablet daily except 1/2 tablet on Mondays Wednesdays and Fridays. Recheck in 3 weeks. Coumadin Clinic # [phone].

## 2017-10-02 ENCOUNTER — Other Ambulatory Visit: Payer: Self-pay | Admitting: Interventional Cardiology

## 2017-10-05 ENCOUNTER — Other Ambulatory Visit: Payer: Self-pay | Admitting: Pulmonary Disease

## 2017-10-12 ENCOUNTER — Ambulatory Visit (INDEPENDENT_AMBULATORY_CARE_PROVIDER_SITE_OTHER): Payer: Medicare Other | Admitting: Pulmonary Disease

## 2017-10-12 ENCOUNTER — Encounter: Payer: Self-pay | Admitting: Pulmonary Disease

## 2017-10-12 DIAGNOSIS — J309 Allergic rhinitis, unspecified: Secondary | ICD-10-CM

## 2017-10-12 DIAGNOSIS — J9611 Chronic respiratory failure with hypoxia: Secondary | ICD-10-CM

## 2017-10-12 DIAGNOSIS — J849 Interstitial pulmonary disease, unspecified: Secondary | ICD-10-CM

## 2017-10-12 DIAGNOSIS — Z6841 Body Mass Index (BMI) 40.0 and over, adult: Secondary | ICD-10-CM | POA: Diagnosis not present

## 2017-10-12 LAB — PULMONARY FUNCTION TEST
DL/VA % pred: 71 %
DL/VA: 3.42 ml/min/mmHg/L
DLCO unc % pred: 150 %
DLCO unc: 36.39 ml/min/mmHg
FEF 25-75 Post: 1.43 L/sec
FEF 25-75 Pre: 1.77 L/sec
FEF2575-%CHANGE-POST: -19 %
FEF2575-%Pred-Post: 58 %
FEF2575-%Pred-Pre: 72 %
FEV1-%Change-Post: -4 %
FEV1-%PRED-PRE: 57 %
FEV1-%Pred-Post: 54 %
FEV1-PRE: 1.5 L
FEV1-Post: 1.43 L
FEV1FVC-%CHANGE-POST: -1 %
FEV1FVC-%Pred-Pre: 109 %
FEV6-%Change-Post: -3 %
FEV6-%PRED-POST: 52 %
FEV6-%Pred-Pre: 54 %
FEV6-PRE: 1.76 L
FEV6-Post: 1.7 L
FEV6FVC-%PRED-PRE: 103 %
FEV6FVC-%Pred-Post: 103 %
FVC-%CHANGE-POST: -3 %
FVC-%Pred-Post: 50 %
FVC-%Pred-Pre: 52 %
FVC-Post: 1.7 L
FVC-Pre: 1.76 L
POST FEV1/FVC RATIO: 84 %
PRE FEV6/FVC RATIO: 100 %
Post FEV6/FVC ratio: 100 %
Pre FEV1/FVC ratio: 85 %
RV % PRED: 113 %
RV: 2.21 L
TLC % pred: 93 %
TLC: 4.7 L

## 2017-10-12 NOTE — Patient Instructions (Addendum)
Continue current regimen Continue close follow-up with [facility] >>>Complete June 4 class >>>Complete preop with [facility] [place] [facility] Discuss support groups with [facility] [place] [facility] as well as availability at [facility] for pre-and post surgery Patient to call office when surgery date is set  She can do nasal nasal saline rinses as needed  Follow-up in 6 months with Dr. Lake [name]   Please contact the office if your symptoms worsen or you have concerns that you are not improving.   Thank you for choosing [facility] for your healthcare, and for allowing [place] to partner with you on your healthcare journey. I am thankful to be able to provide care to you today.   Wyn Quaker FNP-C

## 2017-10-12 NOTE — Assessment & Plan Note (Signed)
Discussed pulmonary function test results today Continue current inhaler regimen Continue Oxygen use  Follow-up in 6 months with Dr. Lake [name]

## 2017-10-12 NOTE — Progress Notes (Signed)
Patient completed full PFT today. 

## 2017-10-12 NOTE — Progress Notes (Signed)
@Patient  ID: [name], female    DOB: 1961/01/06, 57 y.o.   MRN: [id number]  Chief Complaint  Patient presents with  . Follow-up    PFT / Asthma     Referring provider: [name], FNP  HPI: [name] is a 57 y.o. female former smoker, quit 2015 with a 35 pack years smoking history,with asthma, ILD and chronic respiratory failure. She is followed by Dr. Lake [name].  Former patient of Dr. 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  She started having symptoms prior to 2014 and around that time she had a CT scan of her chest which showed innumerable micronodules and mosaicism. At that time she was noted to have mild to moderate mitral regurgitation and moderate aortic stenosis. She had a bronchoscopy which was positive for Haemophilus influenza that was negative. Lung function testing at that time showed evidence of air trapping. Her symptoms progressed and she is having an open lung biopsy in 2015 which showed large numbers of pigmented macrophages filling alveoli with brown granules consistent with hemosiderin. There is no vasculitis or acute inflammation. There is minimal subpleural fibrosis. This is reviewed by Dr. Dian Situ in [place]. In March 2016 she underwent an aortic and mitral valve replacement. Her symptoms seem to have stabilized after that. Notably, she is a former smoker and she quit smoking in 2015.  Previous Recent Visits/ Encounters with [facility]:   06/15/2017-office visit-[name] Patient reports feeling much better still has some shortness of breath with exertion.  Currently taking nutrition classes and planning on bariatric surgery.  Wearing 3 L when walking 2 L at rest.  Adherent to taking her Symbicort.  Was hospitalized back in summer 2018 for congestive heart failure. Will agree with plan on bariatric surgery.  Continue Symbicort.  Repeat PFT.  02/27/2017-telephone encounter-  Nestor- 3 to 4 days of chest tightness and cold, prescribed 40 mg prednisone for 4 days.  02/03/2017-office visit-[name]/Diana Groce NP Hospital follow-up Follow-up after admission of acute on chronic diastolic heart failure.  She was admitted on 01/11/2017 from the cardiology office due to dyspnea on exertion and increased edema and a 25 pound weight gain.  BNP was 1062.  Patient was aggressively diuresed.  Saw a decrease in weight of 9 pounds and improvement in breathing and less pain in legs.  Tests:  PFT: November 2016 pulmonary function testing ratio 77%, FEV1 1.74 L (65% predicted), FVC 2.27 L (66% predicted), total lung capacity 4.58 L (90% predicted), DLCO 13.18 (54% predicted). May 2017 pulmonary function testing normal ratio, FEV1 1.60 L, FVC 1.98 L (58% predicted), total lung capacity 4.56 L (90% predicted), residual volume 2.54 (133% predicted). DLCO 15.75 (64% predicted).  Chest imaging: 2016 CT chest> mosaicism, diffuse mild groundglass 2018 chest x-ray images independently reviewed showing acute pulmonary edema  Sleep study: October 2018 performed by Dr. Radford Pax: No significant obstructive sleep apnea, O2 saturation lowest was 87% on 2 L nasal cannula    10/12/17 - Office Visit:  She presents the office today after completing pulmonary function test today.  Patient reports she is been doing well no breathing issues or changes.  Patient on 3 L right now pulsed air in office.  She reports that she does not have a bariatric surgery date yet.  Patient asked to complete a June 4 class as well as then a preop meeting with [facility].  Dr. [name] to do the surgery.  Patient  states they have completed the meeting with a psychologist.  Patient to purchase booklet psychologist recommended to help support with pre-and post surgery emotions and changes.  Patient reporting adherence to medications.  Patient not reporting needing any refills.     No Known  Allergies  Immunization History  Administered Date(s) Administered  . Influenza Split 04/15/2011  . Influenza,inj,Quad PF,6+ Mos 02/28/2014, 03/19/2015, 02/03/2017  . Pneumococcal Polysaccharide-23 03/17/2007, 04/09/2015  . Tdap 04/15/2011    Past Medical History:  Diagnosis Date  . Allergic rhinitis   . Anemia   . Anxiety   . Aortic stenosis   . Arthritis    "lower back" (11/30/2016)  . CAO (chronic airflow obstruction) (HCC)   . Cellulitis of left lower extremity 11/30/2016  . Depressive disorder   . Gout   . History of blood transfusion 03/2016   "I was anemic"  . HTN (hypertension)   . Hyperlipidemia   . Hypertriglyceridemia   . Kidney disease    Dr. Graylon Gunning  . Menopausal symptoms   . Mitral and aortic heart valve diseases, unspecified 07/2014   s/p AVR with #19 St Jude and s/p MVR with 47mm St. Jude per [name] at [facility]  . Mitral stenosis   . Morbid obesity ([place])   . Noninfectious lymphedema   . On home oxygen therapy    "2-3L when I'm up doing a whole lot" (11/30/2016)  . Renal insufficiency   . Shortness of breath    with exertion  . Sleep apnea   . Undiagnosed cardiac murmurs   . Vitamin D deficiency     Tobacco History: Social History   Tobacco Use  Smoking Status Former Smoker  . Packs/day: 1.00  . Years: 35.00  . Pack years: 35.00  . Types: Cigarettes  . Last attempt to quit: 10/03/2013  . Years since quitting: 4.0  Smokeless Tobacco Never Used   Counseling given: Not Answered   Outpatient Encounter Medications as of 10/12/2017  Medication Sig  . acetaminophen (TYLENOL) 650 MG CR tablet Take 1,300 mg by mouth every 8 (eight) hours as needed for pain.   Marland Kitchen albuterol (PROVENTIL HFA;VENTOLIN HFA) 108 (90 Base) MCG/ACT inhaler Inhale 2 puffs into the lungs 2 (two) times daily as needed for wheezing or shortness of breath. Reported on 06/20/2015  . albuterol (PROVENTIL) (2.5 MG/3ML) 0.083% nebulizer solution Take 3 mLs (2.5 mg total) by nebulization  every 4 (four) hours as needed for wheezing or shortness of breath.  . allopurinol (ZYLOPRIM) 300 MG tablet Take 300 mg by mouth at bedtime.   Marland Kitchen aspirin EC 81 MG tablet Take 81 mg by mouth daily.  Marland Kitchen buPROPion (WELLBUTRIN XL) 300 MG 24 hr tablet Take 300 mg by mouth daily.   . cetirizine (ZYRTEC) 10 MG tablet Take 1 tablet (10 mg total) by mouth daily. (Patient taking differently: Take 10 mg by mouth at bedtime. )  . Cholecalciferol (VITAMIN D3) 2000 UNITS TABS Take 2,000 Units by mouth daily.   . citalopram (CELEXA) 20 MG tablet Take 20 mg by mouth daily.   . furosemide (LASIX) 40 MG tablet TAKE 3 TABLETS BY MOUTH IN THE AM AND 2 TABLETS IN THE PM  . losartan (COZAAR) 50 MG tablet Take 1 tablet (50 mg total) by mouth daily.  . metoprolol (LOPRESSOR) 50 MG tablet Take 50 mg by mouth 2 (two) times daily.  . montelukast (SINGULAIR) 10 MG tablet Take 10 mg by mouth daily.   . OXYGEN Inhale 2.5-3 L  into the lungs continuous.   . potassium chloride SA (KLOR-CON M20) 20 MEQ tablet Take 1 tablet (20 mEq total) by mouth 2 (two) times daily.  . potassium chloride SA (KLOR-CON M20) 20 MEQ tablet Take 1 tablet (20 mEq total) by mouth 2 (two) times daily. Take with Furosemide.  . pravastatin (PRAVACHOL) 80 MG tablet Take 80 mg by mouth daily.   . SYMBICORT 80-4.5 MCG/ACT inhaler TAKE 2 PUFFS BY MOUTH TWICE A DAY  . triamcinolone (NASACORT AQ) 55 MCG/ACT AERO nasal inhaler Place 2 sprays into the nose daily. (Patient taking differently: Place 2 sprays into the nose daily as needed (for seasonal allergies). )  . warfarin (COUMADIN) 7.5 MG tablet TAKE 1 TABLET BY MOUTH DAILY OR AS DIRECTED BY COUMADIN CLINIC   No facility-administered encounter medications on file as of 10/12/2017.      Review of Systems  Constitutional:   No  weight loss, night sweats,  Fevers, chills, fatigue, or  lassitude.  HEENT:   No headaches,  Difficulty swallowing,  Tooth/dental problems, or  Sore throat, No sneezing, itching, ear  ache, nasal congestion, post nasal drip,   CV:  No chest pain,  Orthopnea, PND, swelling in lower extremities, anasarca, dizziness, palpitations, syncope.   GI  No heartburn, indigestion, abdominal pain, nausea, vomiting, diarrhea, change in bowel habits, loss of appetite, bloody stools.   Resp: No shortness of breath with exertion or at rest.  No excess mucus, no productive cough,  No non-productive cough,  No coughing up of blood.  No change in color of mucus.  No wheezing.  No chest wall deformity  Skin: +lymphadema in LRE, no rash   GU: no dysuria, change in color of urine, no urgency or frequency.  No flank pain, no hematuria   MS:  No joint pain or swelling.  No decreased range of motion.  No back pain.    Physical Exam  BP 120/80 (BP Location: Left Arm, Cuff Size: Large)   Pulse 65   Ht 5\' 4"  (1.626 m)   Wt (!) 332 lb (150.6 kg)   SpO2 93%   BMI 56.99 kg/m   GEN: A/Ox3; pleasant , NAD, well nourished, obese    HEENT:  [place]/AT,  EACs-clear, TMs-wnl, NOSE- erythematous , THROAT-clear, no lesions, no postnasal drip or exudate noted.   NECK:  Supple w/ fair ROM; no JVD; normal carotid impulses w/o bruits; no thyromegaly or nodules palpated; no lymphadenopathy.    RESP  Clear  P & A; w/o, wheezes/ rales/ or rhonchi. no accessory muscle use, no dullness to percussion  CARD:  RRR, no m/r/g, no peripheral edema, pulses intact, no cyanosis or clubbing.  GI:   Soft & nt; nml bowel sounds; no organomegaly or masses detected.   Musco: Warm bil, no deformities or joint swelling noted.   Neuro: alert, no focal deficits noted.    Skin: Warm, chronic lymphedema on RLE, no lesions or rashes    Lab Results:  CBC    Component Value Date/Time   WBC 10.0 02/04/2017 0208   RBC 5.33 (H) 02/04/2017 0208   HGB 16.5 (H) 02/04/2017 0208   HGB 14.9 01/10/2017 1520   HCT 51.6 (H) 02/04/2017 0208   HCT 47.3 (H) 01/10/2017 1520   PLT 180 02/04/2017 0208   PLT 194 01/10/2017 1520    MCV 96.8 02/04/2017 0208   MCV 95 01/10/2017 1520   MCH 31.0 02/04/2017 0208   MCHC 32.0 02/04/2017 0208   RDW 17.0 (H)  02/04/2017 0208   RDW 17.7 (H) 01/10/2017 1520   LYMPHSABS 1.3 02/04/2017 0208   MONOABS 1.2 (H) 02/04/2017 0208   EOSABS 0.3 02/04/2017 0208   BASOSABS 0.0 02/04/2017 0208    BMET    Component Value Date/Time   NA 140 02/22/2017 0923   K 4.4 02/22/2017 0923   CL 96 02/22/2017 0923   CO2 26 02/22/2017 0923   GLUCOSE 112 (H) 02/22/2017 0923   GLUCOSE 96 02/04/2017 0208   BUN 23 02/22/2017 0923   CREATININE 1.39 (H) 02/22/2017 0923   CREATININE 1.42 (H) 04/07/2015 1249   CALCIUM 10.0 02/22/2017 0923   GFRNONAA 42 (L) 02/22/2017 0923   GFRAA 49 (L) 02/22/2017 0923    BNP    Component Value Date/Time   BNP 221.3 (H) 11/30/2016 1326   BNP 281.7 (H) 04/07/2015 1249    ProBNP    Component Value Date/Time   PROBNP 1,062 (H) 01/10/2017 1520   PROBNP 343.0 (H) 09/23/2014 1149    Imaging: No results found.   Assessment & Plan:   Allergic rhinitis Continue Nasacort Saline rinses as needed Continue Zyrtec and Singulair   ILD (interstitial lung disease) ([place]) Discussed pulmonary function test results today Continue current inhaler regimen Continue Oxygen use  Follow-up in 6 months with Dr. Lake [name]   Chronic respiratory failure [facility]) Continue current oxygen regimen Continue current inhaler regimen  Continue close follow-up with [facility] >>>Complete June 4 class >>>Complete preop with [facility] [place] [facility] Discuss support groups with [facility] [place] [facility] as well as availability at [facility] for pre-and post surgery Patient to call office when surgery date is set  Follow-up in 6 months with Dr. Lake [name]    Reviewed today's pulmonary function test with patient.  Also discussed pulmonary function test results with Dr. Lake [name].  Discussed the upcoming steps to her bariatric surgery.  His appointment was 30 minutes long  with over 50% of that time being with direct face-to-face patient care and education.  Lauraine Rinne, NP 10/12/2017

## 2017-10-12 NOTE — Assessment & Plan Note (Signed)
Continue Nasacort Saline rinses as needed Continue Zyrtec and Singulair

## 2017-10-12 NOTE — Assessment & Plan Note (Signed)
Continue current oxygen regimen Continue current inhaler regimen  Continue close follow-up with [facility] >>>Complete June 4 class >>>Complete preop with [facility] [place] [facility] Discuss support groups with [facility] [place] [facility] as well as availability at [facility] for pre-and post surgery Patient to call office when surgery date is set  Follow-up in 6 months with Dr. Lake [name]

## 2017-10-13 NOTE — Progress Notes (Signed)
Reviewed, agree 

## 2017-10-18 ENCOUNTER — Ambulatory Visit (INDEPENDENT_AMBULATORY_CARE_PROVIDER_SITE_OTHER): Payer: Medicare Other | Admitting: *Deleted

## 2017-10-18 DIAGNOSIS — Z5181 Encounter for therapeutic drug level monitoring: Secondary | ICD-10-CM | POA: Diagnosis not present

## 2017-10-18 DIAGNOSIS — Z952 Presence of prosthetic heart valve: Secondary | ICD-10-CM | POA: Diagnosis not present

## 2017-10-18 DIAGNOSIS — I350 Nonrheumatic aortic (valve) stenosis: Secondary | ICD-10-CM | POA: Diagnosis not present

## 2017-10-18 LAB — POCT INR: INR: 2.3 (ref 2.0–3.0)

## 2017-10-18 NOTE — Patient Instructions (Signed)
Description   Today May 21st take 1 and 1/2 tablets then change coumadin dose to  1 tablet daily except 1/2 tablet only on Wednesdays and Fridays. Recheck in 2 weeks. [facility] # [phone].

## 2017-10-31 ENCOUNTER — Ambulatory Visit: Payer: Self-pay | Admitting: Registered"

## 2017-11-01 ENCOUNTER — Encounter: Payer: Medicare Other | Attending: General Surgery | Admitting: Registered"

## 2017-11-01 ENCOUNTER — Encounter: Payer: Self-pay | Admitting: Registered"

## 2017-11-01 ENCOUNTER — Ambulatory Visit (INDEPENDENT_AMBULATORY_CARE_PROVIDER_SITE_OTHER): Payer: Medicare Other | Admitting: *Deleted

## 2017-11-01 DIAGNOSIS — Z5181 Encounter for therapeutic drug level monitoring: Secondary | ICD-10-CM

## 2017-11-01 DIAGNOSIS — Z6841 Body Mass Index (BMI) 40.0 and over, adult: Secondary | ICD-10-CM | POA: Diagnosis not present

## 2017-11-01 DIAGNOSIS — I350 Nonrheumatic aortic (valve) stenosis: Secondary | ICD-10-CM

## 2017-11-01 DIAGNOSIS — Z713 Dietary counseling and surveillance: Secondary | ICD-10-CM | POA: Diagnosis not present

## 2017-11-01 DIAGNOSIS — E669 Obesity, unspecified: Secondary | ICD-10-CM

## 2017-11-01 LAB — POCT INR: INR: 2.9 (ref 2.0–3.0)

## 2017-11-01 NOTE — Patient Instructions (Addendum)
-   Research which bariatric multivitamin and calcium supplement will be the best option for you after surgery. Compare prices and ease of getting them.  - Continue with habits already established.

## 2017-11-01 NOTE — Patient Instructions (Signed)
Description   Continue taking Coumadin 1 tablet daily except 1/2 tablet only on Wednesdays and Fridays. Recheck in 3 weeks. Coumadin Clinic # [phone].      

## 2017-11-01 NOTE — Progress Notes (Signed)
Sleeve Gastrectomy Appt start time: 8:50 end time: 9:08  Assessment: 6th SWL Appointment.   Start Wt at [facility]: 326.8 Wt: 340.3 BMI: 58.41   Co-morbidities: chronic diastolic heart failure, interstitial lung disease/asthma, chronic kidney disease, hypertension, on chronic oxygen, valvular heart disease status post aortic and mitral valve replacement on chronic Coumadin  Pt arrives having gained about 3 lbs from previous visit. Pt states she is taking a multivitamin complete. Pt states she likes Acupuncturist (vanilla and chocolate).   Pt states she is chewing 30 times per bite. Pt states she is doing better with having vegetables at meal time. Pt states she is still struggling with finances and having the food for meals. Pt states she does not eat after 6pm. Pt states she is doing well with keeping fat and sugar in single digits per serving. Pt states she talked with cardiologist and needs to follow-up with her 4-6 weeks after surgery. Pt states cardiologist told her her will not need to take calcium until they follow-up with her after seeing her lab values.   Pt states she has been chewing well. Pt states she needs dentures for her top gum line and believes that will help with chewing. Pt states she does not currently have restrictions as to what she can eat and chew. Pt states she plans to get dentures after having surgery to help with chewing. Pt states she takes a Vitamin D supplement but needs to add multivitamin.   Pt states she is drinking more than 64 ounces of fluid a day. Pt states she and sister share a car currently because she gave her car to her daughter; will have to find out when sister can take her support group. Pt states they have been doing ok with having enough food since previous visit. Pt states they have reduced their spending in food. Pt states when she had a heart valve replaced she was told she could not have a lot of calcium.   Pt states she is eating 3 meals a  day. Pt states she does not like to go out in public and complete daily tasks such as grocery shopping because she's slow and does not want people to get irritated with her. Pt states she cannot eat a lot of Vitamin K due to blood thinner medications.    MEDICATIONS: See list   DIETARY INTAKE:  24-hr recall:  First Meal: yogurt, banana Snack: sometimes goldfish or crackers Second Meal: [place], cheese sandwich  Snack: none Third Meal: black beans, black olives, peppers, [place] pepperoni, ranch  Snack: none Beverages: powdered lemonade, water  Usual physical activity: walking 20-30 min, 3-4 days/week  Diet to Follow: 1600 calories 180 g carbohydrates 120 g protein 44 g fat  Preferred Learning Style:   No preference indicated   Learning Readiness:   Contemplating  Ready  Change in progress     Nutritional Diagnosis:  [place]-3.3 Overweight/obesity related to past poor dietary habits and physical inactivity as evidenced by patient w/ planned sleeve gastrectomy surgery following dietary guidelines for continued weight loss.    Intervention:  Nutrition counseling for upcoming Bariatric Surgery. Pt was educated and counseled on the importance of taking bariatric multivitamins and calcium supplements after surgery as well as choosing appropriate options.   Goals:  - Aim for 150 minutes of physical activity including cardio and weight bearing every week - Research which bariatric multivitamin and calcium supplement will be the best option for you after surgery. Compare prices and ease  of getting them. - Continue with habits already established.   Teaching Method Utilized:  Visual Auditory Hands on  Handouts given during visit include:  Vitamin and Mineral Recommendations  Barriers to learning/adherence to lifestyle change: financial constraints  Demonstrated degree of understanding via:  Teach Back   Monitoring/Evaluation:  Dietary intake, exercise, and body weight  prn.

## 2017-11-21 ENCOUNTER — Encounter: Payer: Medicare Other | Admitting: Skilled Nursing Facility1

## 2017-11-21 ENCOUNTER — Ambulatory Visit (INDEPENDENT_AMBULATORY_CARE_PROVIDER_SITE_OTHER): Payer: Medicare Other | Admitting: *Deleted

## 2017-11-21 DIAGNOSIS — Z5181 Encounter for therapeutic drug level monitoring: Secondary | ICD-10-CM

## 2017-11-21 DIAGNOSIS — E669 Obesity, unspecified: Secondary | ICD-10-CM

## 2017-11-21 DIAGNOSIS — I350 Nonrheumatic aortic (valve) stenosis: Secondary | ICD-10-CM

## 2017-11-21 DIAGNOSIS — Z713 Dietary counseling and surveillance: Secondary | ICD-10-CM | POA: Diagnosis not present

## 2017-11-21 DIAGNOSIS — Z6841 Body Mass Index (BMI) 40.0 and over, adult: Secondary | ICD-10-CM | POA: Diagnosis not present

## 2017-11-21 LAB — POCT INR: INR: 2.3 (ref 2.0–3.0)

## 2017-11-21 NOTE — Patient Instructions (Signed)
Description   Today take 1.5 tablets, then Continue taking Coumadin 1 tablet daily except 1/2 tablet only on Wednesdays and Fridays. Recheck in 2 weeks. Coumadin Clinic # [phone].

## 2017-11-23 ENCOUNTER — Encounter: Payer: Self-pay | Admitting: Skilled Nursing Facility1

## 2017-11-23 NOTE — Progress Notes (Signed)
Pre-Operative Nutrition Class:  Appt start time: 8182   End time:  1830.  Patient was seen on 11/21/2017 for Pre-Operative Bariatric Surgery [facility]   Surgery date:  Surgery type: sleeve Start weight at [facility]: 326.8 Weight today: 340.6  Samples given per MNT protocol. Patient educated on appropriate usage: Bariatric Advantage Multivitamin Lot # [id number] Exp: 11/20  Bariatric Advantage Calcium  Lot # [id number] Exp: [id number]   The following the learning objectives were met by the patient during this course:  Identify Pre-Op Dietary Goals and will begin 2 weeks pre-operatively  Identify appropriate sources of fluids and proteins   State protein recommendations and appropriate sources pre and post-operatively  Identify Post-Operative Dietary Goals and will follow for 2 weeks post-operatively  Identify appropriate multivitamin and calcium sources  Describe the need for physical activity post-operatively and will follow MD recommendations  State when to call healthcare provider regarding medication questions or post-operative complications  Handouts given during class include:  Pre-Op Bariatric Surgery Diet Handout  Protein Shake Handout  Post-Op Bariatric Surgery Nutrition Handout  BELT Program Information Flyer  Support Group Information Flyer  [facility] Bariatric Supplements Price List  Follow-Up Plan: Patient will follow-up at [facility] 2 weeks post operatively for diet advancement per MD.

## 2017-12-05 ENCOUNTER — Ambulatory Visit: Payer: Self-pay

## 2017-12-06 DIAGNOSIS — D751 Secondary polycythemia: Secondary | ICD-10-CM | POA: Diagnosis not present

## 2017-12-06 DIAGNOSIS — D649 Anemia, unspecified: Secondary | ICD-10-CM | POA: Diagnosis not present

## 2017-12-07 ENCOUNTER — Ambulatory Visit (INDEPENDENT_AMBULATORY_CARE_PROVIDER_SITE_OTHER): Payer: Medicare Other | Admitting: *Deleted

## 2017-12-07 ENCOUNTER — Other Ambulatory Visit: Payer: Self-pay | Admitting: Nurse Practitioner

## 2017-12-07 DIAGNOSIS — I350 Nonrheumatic aortic (valve) stenosis: Secondary | ICD-10-CM | POA: Diagnosis not present

## 2017-12-07 DIAGNOSIS — Z5181 Encounter for therapeutic drug level monitoring: Secondary | ICD-10-CM | POA: Diagnosis not present

## 2017-12-07 LAB — POCT INR: INR: 2.9 (ref 2.0–3.0)

## 2017-12-07 NOTE — Patient Instructions (Signed)
Description   Continue taking Coumadin 1 tablet daily except 1/2 tablet only on Wednesdays and Fridays. Recheck in 3 weeks. Coumadin Clinic # [phone].

## 2017-12-19 ENCOUNTER — Ambulatory Visit: Payer: Self-pay | Admitting: Nurse Practitioner

## 2017-12-22 ENCOUNTER — Telehealth: Payer: Self-pay | Admitting: Pulmonary Disease

## 2017-12-22 DIAGNOSIS — K449 Diaphragmatic hernia without obstruction or gangrene: Secondary | ICD-10-CM | POA: Diagnosis not present

## 2017-12-22 DIAGNOSIS — E7800 Pure hypercholesterolemia, unspecified: Secondary | ICD-10-CM | POA: Diagnosis not present

## 2017-12-22 DIAGNOSIS — I5032 Chronic diastolic (congestive) heart failure: Secondary | ICD-10-CM | POA: Diagnosis not present

## 2017-12-22 DIAGNOSIS — N189 Chronic kidney disease, unspecified: Secondary | ICD-10-CM | POA: Diagnosis not present

## 2017-12-22 DIAGNOSIS — Z6841 Body Mass Index (BMI) 40.0 and over, adult: Secondary | ICD-10-CM | POA: Diagnosis not present

## 2017-12-22 DIAGNOSIS — J849 Interstitial pulmonary disease, unspecified: Secondary | ICD-10-CM | POA: Diagnosis not present

## 2017-12-22 DIAGNOSIS — Z952 Presence of prosthetic heart valve: Secondary | ICD-10-CM | POA: Diagnosis not present

## 2017-12-22 DIAGNOSIS — I10 Essential (primary) hypertension: Secondary | ICD-10-CM | POA: Diagnosis not present

## 2017-12-22 DIAGNOSIS — Z7901 Long term (current) use of anticoagulants: Secondary | ICD-10-CM | POA: Diagnosis not present

## 2017-12-22 NOTE — Telephone Encounter (Signed)
-----   Message from Greer Pickerel, MD sent at 12/22/2017  3:42 PM EDT ----- Ruby Cola  Can you give me a call about this pt?  She is nearing approval for sleeve gastrectomy and wanted to discuss her pulm status and perioperative care, likelihood of remaining on vent after surgery, etc  I'm pretty open tomorrow or after 430 today  [phone]  Thanks Randall Hiss

## 2017-12-22 NOTE — Telephone Encounter (Signed)
I discussed her situation with Dr. [name] today.  I know her quite well.  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  This has been stable since she had valve replacement surgery several years ago.  She has been left with a need for chronic oxygen since then.  We know that some patients who have significant increased pulmonary venous pressure for long periods of time can have a permanent NSIP type picture in the lungs.  Fortunately for her hers is mild and has not progressed based on recent lung function testing from this year.  While she is at slightly increased risk for a respiratory complication around the time of surgery I do not think that her respiratory issues are prohibitive for gastric sleeve surgery. I recommend that she proceed because I think it will overall be quite helpful for her.

## 2017-12-28 ENCOUNTER — Ambulatory Visit (INDEPENDENT_AMBULATORY_CARE_PROVIDER_SITE_OTHER): Payer: Medicare Other | Admitting: *Deleted

## 2017-12-28 DIAGNOSIS — I350 Nonrheumatic aortic (valve) stenosis: Secondary | ICD-10-CM | POA: Diagnosis not present

## 2017-12-28 DIAGNOSIS — Z5181 Encounter for therapeutic drug level monitoring: Secondary | ICD-10-CM

## 2017-12-28 LAB — POCT INR: INR: 3.2 — AB (ref 2.0–3.0)

## 2018-01-06 ENCOUNTER — Ambulatory Visit: Payer: Self-pay | Admitting: General Surgery

## 2018-01-06 NOTE — H&P (Signed)
[name] Documented: 12/22/2017 3:16 PM Location: [facility] Patient #: [id number] DOB: 26-Feb-1961 Single / Language: [name] / Race: White Female   History of Present Illness [name]; 12/23/2017 8:47 AM) The patient is a 57 year old female who presents for a bariatric surgery evaluation. She comes back in for long-term follow-up regarding her obesity as well as evaluation for weight loss surgery. I initially met her in October 2018. She has completed supervised weight loss. She has been evaluated and cleared by psychology. She denies any medical changes since I last saw her in October. She denies any hospitalizations or trips to the emergency room. She is still wearing oxygen daily. She had her upper GI which showed a small hiatal hernia. Her ultrasound showed fatty liver and some gallstones but denies postprandial epigastric or right upper quadrant pain. She sees pulmonary and cardiology regularly. She also saw nephrology for clearance for surgery. Dr. Posey [name] thought that from a nephrology standpoint she was safe for bariatric surgery. He thought that she would benefit from bariatric surgery. She did see cardiology and was felt to be at moderate risk. Because of the need to hold her anticoagulation perioperatively for short time she is at risk for embolic stroke due to her underlying mechanical valves. I also talked with her pulmonologist [name] if he felt that she would benefit from weight loss surgery and that from a pulmonary standpoint he did not expect a prolonged intubation or potential need for tracheostomy. She had recently had pulmonary function tests which were stable for her. She would need to be bridged on Lovenox per cardiology. She would need to be on Lovenox up to 12 hours before surgery and restarted on therapeutic Lovenox as soon as possible after surgery.  She denies any chest tightness or angina or TIAs or amaurosis fugax. She denies  any GERD or heartburn or indigestion. She does do her own shopping at the store. She walks with a cart at the store and may have to stop once while shopping to rest.  Bariatric evaluation labs showed a creatinine of 1.3, hemoglobin A1c of 5.9. Total cholesterol 181. Triglyceride level 245 otherwise bariatric evaluation labs were unremarkable.    OCTOBER 2018 - She is referred by [name] for evaluation of weight loss surgery. She attended our seminar in person. She is undecided but is interested in a gastric bypass. She is interested in improving her overall health. One of her main goals is to be able to get off of oxygen.  Despite several attempts for sustained weight loss she has been unsuccessful. She has tried the DASH diet, Atkins, and my fitness pal. She was able to lose 60 pounds with my fitness pal but regained the weight.  Her comorbidities are extensive. 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  She denies any chest pain, chest pressure, or shortness of breath at rest. She does get some shortness of breath with physical activity. She denies any orthopnea or paroxysmal nocturnal dyspnea. She does have chronic lymphedema in her right lower extremity and she has some occasional cellulitis in her left lower extremity. She had a recent admission for acute on chronic diastolic heart failure in September. She has followed up with cardiology and pulmonary since then. She denies any personal or family history of blood clots. She generally has to go on  prednisone 3 times a year for respiratory flare. She had a recent sleep study that showed no significant obstructive sleep apnea. She has been oxygen dependent since 2016. She denies any heartburn, reflux or indigestion. She has a daily bowel  movement. She had what sounds like a CT colonoscopy. She has gone through menopause. She denies any recent issues with venous stasis ulcers. She has bilateral knee pain as well as central low back pain without sciatica. She denies any history of diabetes, TIAs or amaurosis fugax. She did have an episode of Bell's palsy back in late summer. She is a former smoker who quit in 2015.  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 Her hematologist is [name]   Problem List/Past Medical [name]. [name], [name]; 12/23/2017 8:54 AM) CHRONIC KIDNEY DISEASE (N18.9)  CHRONIC ANTICOAGULATION (Z79.01)  HIATAL HERNIA (K44.9)  MORBID OBESITY WITH BMI OF 50.0-59.9, ADULT (E66.01)  MECHANICAL HEART VALVE PRESENT (Z95.2)   Past Surgical History [name]; 12/23/2017 8:54 AM) Cesarean Section - 1  Tonsillectomy  Valve Replacement   Diagnostic Studies History [name]; 12/23/2017 8:54 AM) Colonoscopy  1-5 years ago Mammogram  within last year Pap Smear  1-5 years ago  Allergies [name]; 12/22/2017 3:16 PM) No Known Allergies [03/30/2017]: Allergies Reconciled   Medication History [name]; 12/23/2017 8:54 AM) Allopurinol (300MG Tablet, Oral) Active. Warfarin Sodium (7.5MG Tablet, Oral) Active. Ventolin HFA (108 (90 Base)MCG/ACT Aerosol Soln, Inhalation) Active. Symbicort (80-4.5MCG/ACT Aerosol, Inhalation) Active. Baby Aspirin (81MG Tablet Chewable, Oral) Active. BuPROPion HCl ER (XL) (300MG Tablet ER 24HR, Oral) Active. Cetirizine HCl (10MG Tablet, Oral) Active. Citalopram Hydrobromide (20MG Tablet, Oral) Active. Furosemide (40MG Tablet, Oral) Active. Potassium Chloride (20MEQ Tablet ER, Oral) Active. Multivitamin Adult (Oral) Active. Losartan Potassium (50MG Tablet, Oral) Active. Metoprolol Tartrate (50MG Tablet, Oral) Active. Pravastatin Sodium (80MG Tablet, Oral)  Active. Montelukast Sodium (10MG Tablet, Oral) Active. Vitamin D3 (2000UNIT Tablet, Oral) Active. Medications Reconciled Spironolactone (25MG Tablet, Oral) Active. Acetaminophen (650MG Tablet, Oral) Active. Oxygen Permeable Lens Products Active. Nasacort AQ (55MCG/ACT Aerosol, Nasal) Active.  Social History [name]; 12/23/2017 8:54 AM) Alcohol use  Remotely quit alcohol use. Illicit drug use  Remotely quit drug use. No caffeine use  Tobacco use  Former smoker.  Family History [name]; 12/23/2017 8:54 AM) Diabetes Mellitus  Brother. Heart Disease  Brother, Father, Mother, Sister. Heart disease in female family member before age 41  Heart disease in female family member before age 67  Hypertension  Brother, Father, Mother, Sister. Kidney Disease  Father. Respiratory Condition  Mother.  Pregnancy / Birth History [name]; 12/23/2017 8:54 AM) Age at menarche  28 years. Age of menopause  38-50 Contraceptive History  Intrauterine device. Gravida  2 Irregular periods  Maternal age  12-20 Para  1  Other Problems [name]; 12/23/2017 8:54 AM) Heart murmur  HYPERCHOLESTEROLEMIA (E78.00)  ESSENTIAL HYPERTENSION (I10)  Home Oxygen Use  INTERSTITIAL LUNG DISEASE (M35.3)  DIASTOLIC CHF, CHRONIC (I14.43)     Review of Systems [name]; 12/23/2017 8:42 AM) General Present- Weight Gain. Not Present- Appetite Loss, Chills, Fatigue, Fever, Night Sweats and Weight Loss. Skin Not Present- Change in Wart/Mole, Dryness, Hives, Jaundice, New Lesions, Non-Healing Wounds, Rash and Ulcer. HEENT Present- Seasonal Allergies and Wears glasses/contact lenses. Not Present- Earache, Hearing Loss, Hoarseness, Nose Bleed, Oral Ulcers, Ringing in the Ears, Sinus Pain,  Sore Throat, Visual Disturbances and Yellow Eyes. Respiratory Present- Difficulty Breathing. Not Present- Bloody sputum, Chronic Cough, Snoring and  Wheezing. Breast Not Present- Breast Mass, Breast Pain, Nipple Discharge and Skin Changes. Cardiovascular Present- Shortness of Breath. Not Present- Chest Pain, Difficulty Breathing Lying Down, Leg Cramps, Palpitations, Rapid Heart Rate and Swelling of Extremities. Gastrointestinal Not Present- Abdominal Pain, Bloating, Bloody Stool, Change in Bowel Habits, Chronic diarrhea, Constipation, Difficulty Swallowing, Excessive gas, Gets full quickly at meals, Hemorrhoids, Indigestion, Nausea, Rectal Pain and Vomiting. Female Genitourinary Present- Frequency. Not Present- Nocturia, Painful Urination, Pelvic Pain and Urgency. Musculoskeletal Present- Joint Stiffness. Not Present- Back Pain, Joint Pain, Muscle Pain, Muscle Weakness and Swelling of Extremities. Neurological Not Present- Decreased Memory, Fainting, Headaches, Numbness, Seizures, Tingling, Tremor, Trouble walking and Weakness. Psychiatric Present- Depression. Not Present- Anxiety, Bipolar, Change in Sleep Pattern, Fearful and Frequent crying. Endocrine Not Present- Cold Intolerance, Excessive Hunger, Hair Changes, Heat Intolerance, Hot flashes and New Diabetes. Hematology Present- Blood Thinners. Not Present- Easy Bruising, Excessive bleeding, Gland problems, HIV and Persistent Infections.  Vitals ([name]; 12/22/2017 3:21 PM) 12/22/2017 3:20 PM Weight: 345 lb Height: 64in Body Surface Area: 2.47 m Body Mass Index: 59.22 kg/m  Temp.: 97.57F(Oral)  Pulse: 70 (Regular)  P.OX: 87% (Room air) BP: 144/80 (Sitting, Left Arm, Standard)       Physical Exam [name]; 12/23/2017 8:42 AM) General Mental Status-Alert. General Appearance-Consistent with stated age. Hydration-Well hydrated. Voice-Normal. Note: morbidly obese, evenly distributed; On Oxygen   Integumentary Note: chronic lymphedema of RLE - skin thickening, no active ulcers; LLE = some mild chronic lymphedema, brawny skin   Head and  Neck Head-normocephalic, atraumatic with no lesions or palpable masses. Trachea-midline. Thyroid Gland Characteristics - normal size and consistency.  Eye Eyeball - Bilateral-Extraocular movements intact. Sclera/Conjunctiva - Bilateral-No scleral icterus.  ENMT Mouth and Throat -Note: lips intact.  Note: normal ext ears   Chest and Lung Exam Chest and lung exam reveals -quiet, even and easy respiratory effort with no use of accessory muscles and on auscultation, normal breath sounds, no adventitious sounds and normal vocal resonance. Inspection Chest Wall - Normal. Back - normal.  Breast - Did not examine.  Cardiovascular Cardiovascular examination reveals -normal heart sounds, regular rate and rhythm with no murmurs and normal pedal pulses bilaterally. Note: +click Note: old sternal incision   Abdomen Inspection Inspection of the abdomen reveals - No Hernias. Skin - Scar - no surgical scars. Palpation/Percussion Palpation and Percussion of the abdomen reveal - Soft, Non Tender, No Rebound tenderness, No Rigidity (guarding) and No hepatosplenomegaly. Auscultation Auscultation of the abdomen reveals - Bowel sounds normal.  Peripheral Vascular Upper Extremity Palpation - Pulses bilaterally normal.  Neurologic Neurologic evaluation reveals -alert and oriented x 3 with no impairment of recent or remote memory. Mental Status-Normal.  Neuropsychiatric The patient's mood and affect are described as -normal. Judgment and Insight-insight is appropriate concerning matters relevant to self.  Musculoskeletal Normal Exam - Left-Upper Extremity Strength Normal and Lower Extremity Strength Normal. Normal Exam - Right-Upper Extremity Strength Normal and Lower Extremity Strength Normal.  Lymphatic Head & Neck  General Head & Neck Lymphatics: Bilateral - Description - Normal. Axillary - Did not examine. Femoral & Inguinal - Did not  examine.    Assessment & Plan [name]; 12/23/2017 8:54 AM) MORBID OBESITY WITH BMI OF 50.0-59.9, ADULT (E66.01) Impression: The patient meets weight loss surgery criteria. I think the patient would be an acceptable candidate for Laparoscopic vertical  sleeve gastrectomy. She understands that she is at increased risk because of her cardiac history, need for aggressive anticoagulation, and pulmonary status.  We briefly rediscussed laparoscopic sleeve gastrectomy. I offered to go over risk and benefits again but she declined. She stated that we had an extensive conversation at the previous appointment and she is done additional reading and research. We discussed long-term risk of sleeve gastrectomy being reflux. She was given a copy of the surgical consent and read over it. She had no additional questions or concerns. Current Plans Pt Education - EMW_preopbariatric MECHANICAL HEART VALVE PRESENT (Z95.2) Impression: Because of her mechanical valve she is on aggressive anticoagulation. We did explain that this will raise her risk for bleeding postoperatively since we will need to be aggressive with her anticoagulation because of her mechanical valves. coumadin clinic will instruct pt on when to stop coumadin and start/stop lovenox. CHRONIC KIDNEY DISEASE (N18.9) Impression: recd clearance from Dr [name] for surgery CHRONIC ANTICOAGULATION (Z79.01) ESSENTIAL HYPERTENSION (I10) HYPERCHOLESTEROLEMIA (C12.75) DIASTOLIC CHF, CHRONIC (T70.01) Impression: moderate risk from CV standpoint per cards. I discussed with her cardiology's assessment and thoughts that she would be moderate risk for surgery. We discussed that that could involve irregular heartbeat, MI, or stroke. Understanding this the patient still wishes to proceed with surgery. Since she understands the risk I'm willing to proceed with weight loss surgery. INTERSTITIAL LUNG DISEASE (J84.9) Impression: per dr Lake [name] - "I discussed her  situation with Dr. Redmond [name] today. I know her quite well. 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 This has been stable since she had valve replacement surgery several years ago. She has been left with a need for chronic oxygen since then. We know that some patients who have significant increased pulmonary venous pressure for long periods of time can have a permanent NSIP type picture in the lungs. Fortunately for her hers is mild and has not progressed based on recent lung function testing from this year. While she is at slightly increased risk for a respiratory complication around the time of surgery I do not think that her respiratory issues are prohibitive for gastric sleeve surgery. I recommend that she proceed because I think it will overall be quite helpful for her." HIATAL HERNIA (K44.9) Impression: We discussed the findings of a small hiatal hernia on upper GI. I recommended that we will test for one intraoperatively. If found to have a clinically significant hiatal hernia I recommended repair. We discussed what that would involve.  [name]. [name], [name], FACS General, Bariatric, & Minimally Invasive Surgery [facility], [place]

## 2018-01-10 NOTE — Patient Instructions (Addendum)
[name]  01/10/2018   Your procedure is scheduled on: 01/24/2018   Report to [facility] Main  Entrance  Report to admitting at    0530 AM    Call this number if you have problems the morning of surgery [phone]   Remember: Do not eat food  :After Midnight.     Take these medicines the morning of surgery with A SIP OF WATER: Inhalers as usual and bring, Wellbutrin, Celexa, Metoprolol, Singulair                                 You may not have any metal on your body including hair pins and              piercings  Do not wear jewelry, make-up, lotions, powders or perfumes, deodorant             Do not wear nail polish.  Do not shave  48 hours prior to surgery.     Do not bring valuables to the hospital. [place].  Contacts, dentures or bridgework may not be worn into surgery.  Leave suitcase in the car. After surgery it may be brought to your room.   Marland Kitchen    Special Instructions: coughing and deep breathing exercises, leg exercises               Please read over the following fact sheets you were given: _____________________________________________________________________  MORNING OF SURGERY DRINK:  1SHAKE BEFORE YOU LEAVE HOME, DRINK ALL OF THE SHAKE AT ONE TIME.   NO SOLID FOOD AFTER 600 PM THE NIGHT BEFORE YOUR SURGERY. YOU MAY DRINK CLEAR FLUIDS. THE SHAKE YOU DRINK BEFORE YOU LEAVE HOME WILL BE THE LAST FLUIDS YOU DRINK BEFORE SURGERY.  PAIN IS EXPECTED AFTER SURGERY AND WILL NOT BE COMPLETELY ELIMINATED. AMBULATION AND TYLENOL WILL HELP REDUCE INCISIONAL AND GAS PAIN. MOVEMENT IS KEY!  YOU ARE EXPECTED TO BE OUT OF BED WITHIN 4 HOURS OF ADMISSION TO YOUR PATIENT ROOM.  SITTING IN THE RECLINER THROUGHOUT THE DAY IS IMPORTANT FOR DRINKING FLUIDS AND MOVING GAS THROUGHOUT THE GI TRACT.  COMPRESSION STOCKINGS SHOULD BE WORN [facility] UNLESS YOU ARE WALKING.   INCENTIVE  SPIROMETER SHOULD BE USED EVERY HOUR WHILE AWAKE TO DECREASE POST-OPERATIVE COMPLICATIONS SUCH AS PNEUMONIA.  WHEN DISCHARGED HOME, IT IS IMPORTANT TO CONTINUE TO WALK EVERY HOUR AND USE THE INCENTIVE SPIROMETER EVERY HOUR.               CLEAR LIQUID DIET   Foods Allowed                                                                     Foods Excluded  Coffee and tea, regular and decaf                             liquids that you cannot  Plain Jell-O in any flavor  see through such as: Fruit ices (not with fruit pulp)                                     milk, soups, orange juice  Iced Popsicles                                    All solid food Carbonated beverages, regular and diet                                    Cranberry, grape and apple juices Sports drinks like Gatorade Lightly seasoned clear broth or consume(fat free) Sugar, honey syrup  Sample Menu Breakfast                                Lunch                                     Supper Cranberry juice                    Beef broth                            Chicken broth Jell-O                                     Grape juice                           Apple juice Coffee or tea                        Jell-O                                      Popsicle                                                Coffee or tea                        Coffee or tea  _____________________________________________________________________  [facility] - Preparing for Surgery Before surgery, you can play an important role.  Because skin is not sterile, your skin needs to be as free of germs as possible.  You can reduce the number of germs on your skin by washing with CHG (chlorahexidine gluconate) soap before surgery.  CHG is an antiseptic cleaner which kills germs and bonds with the skin to continue killing germs even after washing. Please DO NOT use if you have an allergy to CHG or antibacterial soaps.   If your skin becomes reddened/irritated stop using the CHG and inform your nurse when you arrive at Short Stay. Do not shave (including legs and underarms)  for at least 48 hours prior to the first CHG shower.  You may shave your face/neck. Please follow these instructions carefully:  1.  Shower with CHG Soap the night before surgery and the  morning of Surgery.  2.  If you choose to wash your hair, wash your hair first as usual with your  normal  shampoo.  3.  After you shampoo, rinse your hair and body thoroughly to remove the  shampoo.                           4.  Use CHG as you would any other liquid soap.  You can apply chg directly  to the skin and wash                       Gently with a scrungie or clean washcloth.  5.  Apply the CHG Soap to your body ONLY FROM THE NECK DOWN.   Do not use on face/ open                           Wound or open sores. Avoid contact with eyes, ears mouth and genitals (private parts).                       Wash face,  Genitals (private parts) with your normal soap.             6.  Wash thoroughly, paying special attention to the area where your surgery  will be performed.  7.  Thoroughly rinse your body with warm water from the neck down.  8.  DO NOT shower/wash with your normal soap after using and rinsing off  the CHG Soap.                9.  Pat yourself dry with a clean towel.            10.  Wear clean pajamas.            11.  Place clean sheets on your bed the night of your first shower and do not  sleep with pets. Day of Surgery : Do not apply any lotions/deodorants the morning of surgery.  Please wear clean clothes to the hospital/surgery center.  FAILURE TO FOLLOW THESE INSTRUCTIONS MAY RESULT IN THE CANCELLATION OF YOUR SURGERY PATIENT SIGNATURE_________________________________  NURSE SIGNATURE__________________________________  ________________________________________________________________________  WHAT IS A BLOOD TRANSFUSION? Blood  Transfusion Information  A transfusion is the replacement of blood or some of its parts. Blood is made up of multiple cells which provide different functions.  Red blood cells carry oxygen and are used for blood loss replacement.  White blood cells fight against infection.  Platelets control bleeding.  Plasma helps clot blood.  Other blood products are available for specialized needs, such as hemophilia or other clotting disorders. BEFORE THE TRANSFUSION  Who gives blood for transfusions?   Healthy volunteers who are fully evaluated to make sure their blood is safe. This is blood bank blood. Transfusion therapy is the safest it has ever been in the practice of medicine. Before blood is taken from a donor, a complete history is taken to make sure that person has no history of diseases nor engages in risky social behavior (examples are intravenous drug use or sexual activity with multiple partners). The donor's travel history is screened to minimize risk of transmitting  infections, such as malaria. The donated blood is tested for signs of infectious diseases, such as HIV and hepatitis. The blood is then tested to be sure it is compatible with you in order to minimize the chance of a transfusion reaction. If you or a relative donates blood, this is often done in anticipation of surgery and is not appropriate for emergency situations. It takes many days to process the donated blood. RISKS AND COMPLICATIONS Although transfusion therapy is very safe and saves many lives, the main dangers of transfusion include:   Getting an infectious disease.  Developing a transfusion reaction. This is an allergic reaction to something in the blood you were given. Every precaution is taken to prevent this. The decision to have a blood transfusion has been considered carefully by your caregiver before blood is given. Blood is not given unless the benefits outweigh the risks. AFTER THE TRANSFUSION  Right after  receiving a blood transfusion, you will usually feel much better and more energetic. This is especially true if your red blood cells have gotten low (anemic). The transfusion raises the level of the red blood cells which carry oxygen, and this usually causes an energy increase.  The nurse administering the transfusion will monitor you carefully for complications. HOME CARE INSTRUCTIONS  No special instructions are needed after a transfusion. You may find your energy is better. Speak with your caregiver about any limitations on activity for underlying diseases you may have. SEEK MEDICAL CARE IF:   Your condition is not improving after your transfusion.  You develop redness or irritation at the intravenous (IV) site. SEEK IMMEDIATE MEDICAL CARE IF:  Any of the following symptoms occur over the next 12 hours:  Shaking chills.  You have a temperature by mouth above 102 F (38.9 C), not controlled by medicine.  Chest, back, or muscle pain.  People around you feel you are not acting correctly or are confused.  Shortness of breath or difficulty breathing.  Dizziness and fainting.  You get a rash or develop hives.  You have a decrease in urine output.  Your urine turns a dark color or changes to pink, red, or brown. Any of the following symptoms occur over the next 10 days:  You have a temperature by mouth above 102 F (38.9 C), not controlled by medicine.  Shortness of breath.  Weakness after normal activity.  The white part of the eye turns yellow (jaundice).  You have a decrease in the amount of urine or are urinating less often.  Your urine turns a dark color or changes to pink, red, or brown. Document Released: 05/14/2000 Document Revised: 08/09/2011 Document Reviewed: 01/01/2008 ExitCare Patient Information 2014 [place].  _______________________________________________________________________  Incentive Spirometer  An incentive spirometer is a tool that can  help keep your lungs clear and active. This tool measures how well you are filling your lungs with each breath. Taking long deep breaths may help reverse or decrease the chance of developing breathing (pulmonary) problems (especially infection) following:  A long period of time when you are unable to move or be active. BEFORE THE PROCEDURE   If the spirometer includes an indicator to show your best effort, your nurse or respiratory therapist will set it to a desired goal.  If possible, sit up straight or lean slightly forward. Try not to slouch.  Hold the incentive spirometer in an upright position. INSTRUCTIONS FOR USE  1. Sit on the edge of your bed if possible, or sit up as far  as you can in bed or on a chair. 2. Hold the incentive spirometer in an upright position. 3. Breathe out normally. 4. Place the mouthpiece in your mouth and seal your lips tightly around it. 5. Breathe in slowly and as deeply as possible, raising the piston or the ball toward the top of the column. 6. Hold your breath for 3-5 seconds or for as long as possible. Allow the piston or ball to fall to the bottom of the column. 7. Remove the mouthpiece from your mouth and breathe out normally. 8. Rest for a few seconds and repeat Steps 1 through 7 at least 10 times every 1-2 hours when you are awake. Take your time and take a few normal breaths between deep breaths. 9. The spirometer may include an indicator to show your best effort. Use the indicator as a goal to work toward during each repetition. 10. After each set of 10 deep breaths, practice coughing to be sure your lungs are clear. If you have an incision (the cut made at the time of surgery), support your incision when coughing by placing a pillow or rolled up towels firmly against it. Once you are able to get out of bed, walk around indoors and cough well. You may stop using the incentive spirometer when instructed by your caregiver.  RISKS AND COMPLICATIONS  Take  your time so you do not get dizzy or light-headed.  If you are in pain, you may need to take or ask for pain medication before doing incentive spirometry. It is harder to take a deep breath if you are having pain. AFTER USE  Rest and breathe slowly and easily.  It can be helpful to keep track of a log of your progress. Your caregiver can provide you with a simple table to help with this. If you are using the spirometer at home, follow these instructions: [place] IF:   You are having difficultly using the spirometer.  You have trouble using the spirometer as often as instructed.  Your pain medication is not giving enough relief while using the spirometer.  You develop fever of 100.5 F (38.1 C) or higher. SEEK IMMEDIATE MEDICAL CARE IF:   You cough up bloody sputum that had not been present before.  You develop fever of 102 F (38.9 C) or greater.  You develop worsening pain at or near the incision site. MAKE SURE YOU:   Understand these instructions.  Will watch your condition.  Will get help right away if you are not doing well or get worse. Document Released: 09/27/2006 Document Revised: 08/09/2011 Document Reviewed: 11/28/2006 [facility] Patient Information 2014 [place], [place].   ________________________________________________________________________

## 2018-01-11 NOTE — Progress Notes (Signed)
10-12-17 LOV and PFT pulmonaryepic  Telephone clearance 12-22-17 pulmonary epic  09-21-17 LOV cardiology

## 2018-01-11 NOTE — Progress Notes (Signed)
Echo  In epic 2018   cxr 04-12-17 epic  ekg 09-21-17 epic

## 2018-01-12 ENCOUNTER — Ambulatory Visit (HOSPITAL_COMMUNITY)
Admission: RE | Admit: 2018-01-12 | Discharge: 2018-01-12 | Disposition: A | Discharge status: Home / Self Care | Payer: Medicare Other | Source: Ambulatory Visit | Attending: Anesthesiology | Admitting: Anesthesiology

## 2018-01-12 ENCOUNTER — Other Ambulatory Visit: Payer: Self-pay

## 2018-01-12 ENCOUNTER — Encounter (HOSPITAL_COMMUNITY): Payer: Self-pay

## 2018-01-12 ENCOUNTER — Encounter (HOSPITAL_COMMUNITY)
Admission: RE | Admit: 2018-01-12 | Discharge: 2018-01-12 | Disposition: A | Discharge status: Home / Self Care | Payer: Medicare Other | Source: Ambulatory Visit | Attending: General Surgery | Admitting: General Surgery

## 2018-01-12 DIAGNOSIS — Z01818 Encounter for other preprocedural examination: Secondary | ICD-10-CM | POA: Insufficient documentation

## 2018-01-12 DIAGNOSIS — E669 Obesity, unspecified: Secondary | ICD-10-CM | POA: Insufficient documentation

## 2018-01-12 DIAGNOSIS — Z6841 Body Mass Index (BMI) 40.0 and over, adult: Secondary | ICD-10-CM | POA: Diagnosis not present

## 2018-01-12 DIAGNOSIS — Z01812 Encounter for preprocedural laboratory examination: Secondary | ICD-10-CM | POA: Diagnosis not present

## 2018-01-12 DIAGNOSIS — R0989 Other specified symptoms and signs involving the circulatory and respiratory systems: Secondary | ICD-10-CM | POA: Diagnosis not present

## 2018-01-12 HISTORY — DX: Heart failure, unspecified: I50.9

## 2018-01-12 HISTORY — DX: Personal history of other diseases of the digestive system: Z87.19

## 2018-01-12 HISTORY — DX: Lymphedema, not elsewhere classified: I89.0

## 2018-01-12 LAB — PROTIME-INR
INR: 2.06
PROTHROMBIN TIME: 23 s — AB (ref 11.4–15.2)

## 2018-01-12 LAB — APTT: aPTT: 48 seconds — ABNORMAL HIGH (ref 24–36)

## 2018-01-12 LAB — CBC WITH DIFFERENTIAL/PLATELET
Basophils Absolute: 0 10*3/uL (ref 0.0–0.1)
Basophils Relative: 0 %
EOS PCT: 3 %
Eosinophils Absolute: 0.2 10*3/uL (ref 0.0–0.7)
HCT: 43.3 % (ref 36.0–46.0)
Hemoglobin: 14 g/dL (ref 12.0–15.0)
LYMPHS ABS: 1.2 10*3/uL (ref 0.7–4.0)
LYMPHS PCT: 14 %
MCH: 32.4 pg (ref 26.0–34.0)
MCHC: 32.3 g/dL (ref 30.0–36.0)
MCV: 100.2 fL — AB (ref 78.0–100.0)
MONOS PCT: 12 %
Monocytes Absolute: 1.1 10*3/uL — ABNORMAL HIGH (ref 0.1–1.0)
Neutro Abs: 6.1 10*3/uL (ref 1.7–7.7)
Neutrophils Relative %: 71 %
PLATELETS: 234 10*3/uL (ref 150–400)
RBC: 4.32 MIL/uL (ref 3.87–5.11)
RDW: 14.5 % (ref 11.5–15.5)
WBC: 8.6 10*3/uL (ref 4.0–10.5)

## 2018-01-12 LAB — COMPREHENSIVE METABOLIC PANEL
ALT: 25 U/L (ref 0–44)
AST: 35 U/L (ref 15–41)
Albumin: 4.4 g/dL (ref 3.5–5.0)
Alkaline Phosphatase: 58 U/L (ref 38–126)
Anion gap: 11 (ref 5–15)
BUN: 47 mg/dL — ABNORMAL HIGH (ref 6–20)
CHLORIDE: 97 mmol/L — AB (ref 98–111)
CO2: 32 mmol/L (ref 22–32)
CREATININE: 1.38 mg/dL — AB (ref 0.44–1.00)
Calcium: 10.1 mg/dL (ref 8.9–10.3)
GFR, EST AFRICAN AMERICAN: 48 mL/min — AB (ref >60)
GFR, EST NON AFRICAN AMERICAN: 41 mL/min — AB (ref >60)
Glucose, Bld: 92 mg/dL (ref 70–99)
Potassium: 5.1 mmol/L (ref 3.5–5.1)
Sodium: 140 mmol/L (ref 135–145)
Total Bilirubin: 0.7 mg/dL (ref 0.3–1.2)
Total Protein: 8.2 g/dL — ABNORMAL HIGH (ref 6.5–8.1)

## 2018-01-12 NOTE — Progress Notes (Addendum)
Cmp, PT,PTT done 01-12-18 routed to Dr. [name] via epic

## 2018-01-12 NOTE — Progress Notes (Signed)
Dr. Royce Macadamia aware of pt. History of alveolar hemorrhage and valve replacement  And continuous oxygen use. Pt.'s clearances on chart .

## 2018-01-16 ENCOUNTER — Ambulatory Visit (INDEPENDENT_AMBULATORY_CARE_PROVIDER_SITE_OTHER): Payer: Medicare Other | Admitting: Pharmacist

## 2018-01-16 DIAGNOSIS — Z5181 Encounter for therapeutic drug level monitoring: Secondary | ICD-10-CM

## 2018-01-16 DIAGNOSIS — I350 Nonrheumatic aortic (valve) stenosis: Secondary | ICD-10-CM | POA: Diagnosis not present

## 2018-01-16 LAB — POCT INR: INR: 2.3 (ref 2.0–3.0)

## 2018-01-16 MED ORDER — ENOXAPARIN SODIUM 150 MG/ML ~~LOC~~ SOLN: 150.0000 mg | Freq: Two times a day (BID) | SUBCUTANEOUS | 1 refills | Status: DC

## 2018-01-16 NOTE — Patient Instructions (Addendum)
Description   Take 1.5 tablet today then continue taking Coumadin 1 tablet daily except 1/2 tablet only on Wednesdays and Fridays. Then follow instructions below around procedure. Coumadin Clinic # [phone].      01/18/18: Last dose of Coumadin.  01/19/18: No Coumadin or Lovenox.  01/20/18: Inject Lovenox 150mg  in the fatty abdominal tissue at least 2 inches from the belly button twice a day about 12 hours apart, 7am and 7pm rotate sites. No Coumadin.  01/21/18: Inject Lovenox in the fatty tissue every 12 hours, 7am and 7pm. No Coumadin.  01/22/18: Inject Lovenox in the fatty tissue every 12 hours, 7am and 7pm. No Coumadin.  01/23/18: Inject Lovenox in the fatty tissue in the morning at 7 am (No PM dose). No Coumadin.  01/24/18: Procedure Day - No Lovenox - Resume Coumadin in the evening or as directed by doctor  01/25/18: Resume Lovenox inject in the fatty tissue every 12 hours and take Coumadin.  01/26/18: Inject Lovenox in the fatty tissue every 12 hours and take Coumadin.  01/27/18: Inject Lovenox in the fatty tissue every 12 hours and take Coumadin.  01/28/18: Inject Lovenox in the fatty tissue every 12 hours and take Coumadin.  01/29/18: Inject Lovenox in the fatty tissue every 12 hours and take Coumadin.  01/30/18: Inject Lovenox in the fatty tissue every 12 hours and take Coumadin.  01/31/18: Coumadin appt to check INR.

## 2018-01-23 MED ORDER — BUPIVACAINE LIPOSOME 1.3 % IJ SUSP
20.0000 mL | Freq: Once | INTRAMUSCULAR | Status: DC
  Filled 2018-01-23: qty 20

## 2018-01-23 NOTE — Anesthesia Preprocedure Evaluation (Addendum)
Anesthesia Evaluation  Patient identified by MRN, date of birth, ID band Patient awake    Reviewed: Allergy & Precautions, NPO status , Patient's Chart, lab work & pertinent test results  Airway Mallampati: II  TM Distance: >3 FB Neck ROM: Full    Dental  (+) Dental Advisory Given   Pulmonary shortness of breath, asthma , sleep apnea , COPD,  oxygen dependent, former smoker,    breath sounds clear to auscultation       Cardiovascular hypertension, Pt. on medications + CAD and +CHF  + dysrhythmias Atrial Fibrillation + Valvular Problems/Murmurs AS  Rhythm:Regular Rate:Normal  11/2016 Echo Study Conclusions - Left ventricle: The cavity size was normal. Wall thickness was  increased in a pattern of moderate LVH. Systolic function was  vigorous. The estimated ejection fraction was in the range of 65%  to 70%. 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  MVA by P T1/2 is 1.8 cm2 No significant MR. No significant change  in gradients from echo in March 2018. Valve area by pressure  half-time: 1.8 cm^2. - Pulmonary arteries: PA peak pressure: 43 mm Hg (S).    Neuro/Psych Anxiety Depression negative neurological ROS     GI/Hepatic Neg liver ROS, hiatal hernia,   Endo/Other  Morbid obesity  Renal/GU CRFRenal disease     Musculoskeletal  (+) Arthritis ,   Abdominal   Peds  Hematology  (+) anemia ,   Anesthesia Other Findings   Reproductive/Obstetrics                            Lab Results  Component Value Date   WBC 8.6 01/12/2018   HGB 14.0 01/12/2018   HCT 43.3 01/12/2018   MCV 100.2 (H) 01/12/2018   PLT 234 01/12/2018   Lab  Results  Component Value Date   CREATININE 1.38 (H) 01/12/2018   BUN 47 (H) 01/12/2018   NA 140 01/12/2018   K 5.1 01/12/2018   CL 97 (L) 01/12/2018   CO2 32 01/12/2018    Anesthesia Physical Anesthesia Plan  ASA: III  Anesthesia Plan: General   Post-op Pain Management:    Induction: Intravenous  PONV Risk Score and Plan: 3 and Scopolamine patch - Pre-op, Dexamethasone, Ondansetron and Treatment may vary due to age or medical condition  Airway Management Planned: Oral ETT  Additional Equipment:   Intra-op Plan:   Post-operative Plan: Possible Post-op intubation/ventilation and Extubation in OR  Informed Consent: I have reviewed the patients History and Physical, chart, labs and discussed the procedure including the risks, benefits and alternatives for the proposed anesthesia with the patient or authorized representative who has indicated his/her understanding and acceptance.   Dental advisory given  Plan Discussed with: CRNA  Anesthesia Plan Comments:        Anesthesia Quick Evaluation

## 2018-01-24 ENCOUNTER — Inpatient Hospital Stay (HOSPITAL_COMMUNITY): Payer: Medicare Other | Admitting: Anesthesiology

## 2018-01-24 ENCOUNTER — Encounter (HOSPITAL_COMMUNITY): Payer: Self-pay | Admitting: Emergency Medicine

## 2018-01-24 ENCOUNTER — Other Ambulatory Visit: Payer: Self-pay

## 2018-01-24 ENCOUNTER — Encounter (HOSPITAL_COMMUNITY)
Admission: RE | Disposition: A | Discharge status: Home / Self Care | Payer: Self-pay | Source: Ambulatory Visit | Attending: General Surgery

## 2018-01-24 ENCOUNTER — Inpatient Hospital Stay (HOSPITAL_COMMUNITY): Payer: Medicare Other

## 2018-01-24 ENCOUNTER — Inpatient Hospital Stay (HOSPITAL_COMMUNITY)
Admission: RE | Admit: 2018-01-24 | Discharge: 2018-01-31 | DRG: 619 | Disposition: A | Discharge status: Home / Self Care | Payer: Medicare Other | Source: Ambulatory Visit | Attending: General Surgery | Admitting: General Surgery

## 2018-01-24 DIAGNOSIS — N183 Chronic kidney disease, stage 3 unspecified: Secondary | ICD-10-CM | POA: Diagnosis present

## 2018-01-24 DIAGNOSIS — Y838 Other surgical procedures as the cause of abnormal reaction of the patient, or of later complication, without mention of misadventure at the time of the procedure: Secondary | ICD-10-CM | POA: Diagnosis not present

## 2018-01-24 DIAGNOSIS — Z7951 Long term (current) use of inhaled steroids: Secondary | ICD-10-CM

## 2018-01-24 DIAGNOSIS — J9610 Chronic respiratory failure, unspecified whether with hypoxia or hypercapnia: Secondary | ICD-10-CM | POA: Diagnosis not present

## 2018-01-24 DIAGNOSIS — Z8669 Personal history of other diseases of the nervous system and sense organs: Secondary | ICD-10-CM

## 2018-01-24 DIAGNOSIS — E785 Hyperlipidemia, unspecified: Secondary | ICD-10-CM | POA: Diagnosis present

## 2018-01-24 DIAGNOSIS — F419 Anxiety disorder, unspecified: Secondary | ICD-10-CM | POA: Diagnosis present

## 2018-01-24 DIAGNOSIS — R0602 Shortness of breath: Secondary | ICD-10-CM | POA: Diagnosis not present

## 2018-01-24 DIAGNOSIS — I130 Hypertensive heart and chronic kidney disease with heart failure and stage 1 through stage 4 chronic kidney disease, or unspecified chronic kidney disease: Secondary | ICD-10-CM | POA: Diagnosis not present

## 2018-01-24 DIAGNOSIS — M545 Low back pain: Secondary | ICD-10-CM | POA: Diagnosis present

## 2018-01-24 DIAGNOSIS — Z7982 Long term (current) use of aspirin: Secondary | ICD-10-CM

## 2018-01-24 DIAGNOSIS — J9811 Atelectasis: Secondary | ICD-10-CM | POA: Diagnosis not present

## 2018-01-24 DIAGNOSIS — M25561 Pain in right knee: Secondary | ICD-10-CM | POA: Diagnosis present

## 2018-01-24 DIAGNOSIS — Z6841 Body Mass Index (BMI) 40.0 and over, adult: Secondary | ICD-10-CM

## 2018-01-24 DIAGNOSIS — D631 Anemia in chronic kidney disease: Secondary | ICD-10-CM | POA: Diagnosis present

## 2018-01-24 DIAGNOSIS — Y92230 Patient room in hospital as the place of occurrence of the external cause: Secondary | ICD-10-CM | POA: Diagnosis not present

## 2018-01-24 DIAGNOSIS — I5033 Acute on chronic diastolic (congestive) heart failure: Secondary | ICD-10-CM | POA: Diagnosis not present

## 2018-01-24 DIAGNOSIS — I2510 Atherosclerotic heart disease of native coronary artery without angina pectoris: Secondary | ICD-10-CM | POA: Diagnosis present

## 2018-01-24 DIAGNOSIS — G4733 Obstructive sleep apnea (adult) (pediatric): Secondary | ICD-10-CM | POA: Diagnosis present

## 2018-01-24 DIAGNOSIS — I480 Paroxysmal atrial fibrillation: Secondary | ICD-10-CM | POA: Diagnosis present

## 2018-01-24 DIAGNOSIS — I4892 Unspecified atrial flutter: Secondary | ICD-10-CM | POA: Diagnosis not present

## 2018-01-24 DIAGNOSIS — I10 Essential (primary) hypertension: Secondary | ICD-10-CM | POA: Diagnosis not present

## 2018-01-24 DIAGNOSIS — Z952 Presence of prosthetic heart valve: Secondary | ICD-10-CM | POA: Diagnosis not present

## 2018-01-24 DIAGNOSIS — I9581 Postprocedural hypotension: Secondary | ICD-10-CM | POA: Diagnosis not present

## 2018-01-24 DIAGNOSIS — Z9884 Bariatric surgery status: Secondary | ICD-10-CM

## 2018-01-24 DIAGNOSIS — Z954 Presence of other heart-valve replacement: Secondary | ICD-10-CM

## 2018-01-24 DIAGNOSIS — M109 Gout, unspecified: Secondary | ICD-10-CM | POA: Diagnosis present

## 2018-01-24 DIAGNOSIS — K8020 Calculus of gallbladder without cholecystitis without obstruction: Secondary | ICD-10-CM | POA: Diagnosis present

## 2018-01-24 DIAGNOSIS — N170 Acute kidney failure with tubular necrosis: Secondary | ICD-10-CM | POA: Diagnosis not present

## 2018-01-24 DIAGNOSIS — I080 Rheumatic disorders of both mitral and aortic valves: Secondary | ICD-10-CM | POA: Diagnosis present

## 2018-01-24 DIAGNOSIS — J45909 Unspecified asthma, uncomplicated: Secondary | ICD-10-CM | POA: Diagnosis present

## 2018-01-24 DIAGNOSIS — Z5181 Encounter for therapeutic drug level monitoring: Secondary | ICD-10-CM | POA: Diagnosis not present

## 2018-01-24 DIAGNOSIS — R402143 Coma scale, eyes open, spontaneous, at hospital admission: Secondary | ICD-10-CM | POA: Diagnosis present

## 2018-01-24 DIAGNOSIS — R0902 Hypoxemia: Secondary | ICD-10-CM | POA: Diagnosis present

## 2018-01-24 DIAGNOSIS — D62 Acute posthemorrhagic anemia: Secondary | ICD-10-CM | POA: Diagnosis not present

## 2018-01-24 DIAGNOSIS — J95821 Acute postprocedural respiratory failure: Secondary | ICD-10-CM | POA: Diagnosis not present

## 2018-01-24 DIAGNOSIS — J9600 Acute respiratory failure, unspecified whether with hypoxia or hypercapnia: Secondary | ICD-10-CM | POA: Diagnosis not present

## 2018-01-24 DIAGNOSIS — I952 Hypotension due to drugs: Secondary | ICD-10-CM | POA: Diagnosis not present

## 2018-01-24 DIAGNOSIS — E875 Hyperkalemia: Secondary | ICD-10-CM | POA: Diagnosis not present

## 2018-01-24 DIAGNOSIS — D649 Anemia, unspecified: Secondary | ICD-10-CM | POA: Diagnosis present

## 2018-01-24 DIAGNOSIS — J849 Interstitial pulmonary disease, unspecified: Secondary | ICD-10-CM | POA: Diagnosis not present

## 2018-01-24 DIAGNOSIS — R402363 Coma scale, best motor response, obeys commands, at hospital admission: Secondary | ICD-10-CM | POA: Diagnosis present

## 2018-01-24 DIAGNOSIS — K760 Fatty (change of) liver, not elsewhere classified: Secondary | ICD-10-CM | POA: Diagnosis present

## 2018-01-24 DIAGNOSIS — Z9981 Dependence on supplemental oxygen: Secondary | ICD-10-CM | POA: Diagnosis not present

## 2018-01-24 DIAGNOSIS — K449 Diaphragmatic hernia without obstruction or gangrene: Secondary | ICD-10-CM | POA: Diagnosis present

## 2018-01-24 DIAGNOSIS — I890 Lymphedema, not elsewhere classified: Secondary | ICD-10-CM

## 2018-01-24 DIAGNOSIS — M1990 Unspecified osteoarthritis, unspecified site: Secondary | ICD-10-CM | POA: Diagnosis present

## 2018-01-24 DIAGNOSIS — E7800 Pure hypercholesterolemia, unspecified: Secondary | ICD-10-CM | POA: Diagnosis present

## 2018-01-24 DIAGNOSIS — Z79899 Other long term (current) drug therapy: Secondary | ICD-10-CM

## 2018-01-24 DIAGNOSIS — N1831 Chronic kidney disease, stage 3a: Secondary | ICD-10-CM | POA: Diagnosis present

## 2018-01-24 DIAGNOSIS — S301XXA Contusion of abdominal wall, initial encounter: Secondary | ICD-10-CM | POA: Diagnosis not present

## 2018-01-24 DIAGNOSIS — Z87891 Personal history of nicotine dependence: Secondary | ICD-10-CM

## 2018-01-24 DIAGNOSIS — Z7901 Long term (current) use of anticoagulants: Secondary | ICD-10-CM

## 2018-01-24 DIAGNOSIS — R402253 Coma scale, best verbal response, oriented, at hospital admission: Secondary | ICD-10-CM | POA: Diagnosis present

## 2018-01-24 DIAGNOSIS — I440 Atrioventricular block, first degree: Secondary | ICD-10-CM | POA: Diagnosis not present

## 2018-01-24 DIAGNOSIS — J449 Chronic obstructive pulmonary disease, unspecified: Secondary | ICD-10-CM | POA: Diagnosis present

## 2018-01-24 DIAGNOSIS — D751 Secondary polycythemia: Secondary | ICD-10-CM | POA: Diagnosis present

## 2018-01-24 DIAGNOSIS — M25562 Pain in left knee: Secondary | ICD-10-CM | POA: Diagnosis present

## 2018-01-24 DIAGNOSIS — J9601 Acute respiratory failure with hypoxia: Secondary | ICD-10-CM | POA: Diagnosis not present

## 2018-01-24 DIAGNOSIS — J4540 Moderate persistent asthma, uncomplicated: Secondary | ICD-10-CM | POA: Diagnosis present

## 2018-01-24 DIAGNOSIS — J984 Other disorders of lung: Secondary | ICD-10-CM | POA: Diagnosis not present

## 2018-01-24 DIAGNOSIS — N2889 Other specified disorders of kidney and ureter: Secondary | ICD-10-CM | POA: Diagnosis not present

## 2018-01-24 DIAGNOSIS — E559 Vitamin D deficiency, unspecified: Secondary | ICD-10-CM | POA: Diagnosis present

## 2018-01-24 DIAGNOSIS — E781 Pure hyperglyceridemia: Secondary | ICD-10-CM | POA: Diagnosis present

## 2018-01-24 DIAGNOSIS — N179 Acute kidney failure, unspecified: Secondary | ICD-10-CM

## 2018-01-24 DIAGNOSIS — T4275XA Adverse effect of unspecified antiepileptic and sedative-hypnotic drugs, initial encounter: Secondary | ICD-10-CM | POA: Diagnosis not present

## 2018-01-24 DIAGNOSIS — F329 Major depressive disorder, single episode, unspecified: Secondary | ICD-10-CM | POA: Diagnosis present

## 2018-01-24 HISTORY — DX: Unspecified atrial flutter: I48.92

## 2018-01-24 HISTORY — DX: Chronic kidney disease, stage 3 unspecified: N18.30

## 2018-01-24 HISTORY — PX: LAPAROSCOPIC GASTRIC SLEEVE RESECTION: SHX5895

## 2018-01-24 HISTORY — DX: Bell's palsy: G51.0

## 2018-01-24 HISTORY — DX: Chronic kidney disease, stage 3 (moderate): N18.3

## 2018-01-24 HISTORY — DX: Secondary polycythemia: D75.1

## 2018-01-24 LAB — PROTIME-INR
INR: 1.08
Prothrombin Time: 14 seconds (ref 11.4–15.2)

## 2018-01-24 LAB — APTT: aPTT: 42 seconds — ABNORMAL HIGH (ref 24–36)

## 2018-01-24 LAB — ABO/RH: ABO/RH(D): O POS

## 2018-01-24 LAB — HEMOGLOBIN AND HEMATOCRIT, BLOOD
HEMATOCRIT: 42.7 % (ref 36.0–46.0)
HEMOGLOBIN: 14 g/dL (ref 12.0–15.0)

## 2018-01-24 SURGERY — GASTRECTOMY, SLEEVE, LAPAROSCOPIC
Anesthesia: General

## 2018-01-24 MED ORDER — ONDANSETRON HCL 4 MG/2ML IJ SOLN
INTRAMUSCULAR | Status: AC
  Filled 2018-01-24: qty 2

## 2018-01-24 MED ORDER — SODIUM CHLORIDE 0.9 % IV SOLN
2.0000 g | INTRAVENOUS | Status: AC
  Administered 2018-01-24: 2 g via INTRAVENOUS
  Filled 2018-01-24: qty 2

## 2018-01-24 MED ORDER — ALBUTEROL SULFATE (2.5 MG/3ML) 0.083% IN NEBU: 2.5000 mg | INHALATION_SOLUTION | RESPIRATORY_TRACT | Status: DC | PRN

## 2018-01-24 MED ORDER — SUGAMMADEX SODIUM 500 MG/5ML IV SOLN
INTRAVENOUS | Status: DC | PRN
  Administered 2018-01-24: 400 mg via INTRAVENOUS

## 2018-01-24 MED ORDER — GABAPENTIN 100 MG PO CAPS
200.0000 mg | ORAL_CAPSULE | Freq: Two times a day (BID) | ORAL | Status: DC
  Administered 2018-01-24 – 2018-01-31 (×14): 200 mg via ORAL
  Filled 2018-01-24 (×14): qty 2

## 2018-01-24 MED ORDER — 0.9 % SODIUM CHLORIDE (POUR BTL) OPTIME
TOPICAL | Status: DC | PRN
  Administered 2018-01-24: 1000 mL

## 2018-01-24 MED ORDER — ENOXAPARIN SODIUM 150 MG/ML ~~LOC~~ SOLN
150.0000 mg | Freq: Two times a day (BID) | SUBCUTANEOUS | Status: DC
  Filled 2018-01-24: qty 1

## 2018-01-24 MED ORDER — FENTANYL CITRATE (PF) 100 MCG/2ML IJ SOLN
INTRAMUSCULAR | Status: DC | PRN
  Administered 2018-01-24 (×2): 50 ug via INTRAVENOUS

## 2018-01-24 MED ORDER — MIDAZOLAM HCL 2 MG/2ML IJ SOLN
INTRAMUSCULAR | Status: DC | PRN
  Administered 2018-01-24: 2 mg via INTRAVENOUS

## 2018-01-24 MED ORDER — MOMETASONE FURO-FORMOTEROL FUM 100-5 MCG/ACT IN AERO
2.0000 | INHALATION_SPRAY | Freq: Two times a day (BID) | RESPIRATORY_TRACT | Status: DC
  Administered 2018-01-24 – 2018-01-31 (×14): 2 via RESPIRATORY_TRACT
  Filled 2018-01-24: qty 8.8

## 2018-01-24 MED ORDER — CHLORHEXIDINE GLUCONATE 4 % EX LIQD: 60.0000 mL | Freq: Once | CUTANEOUS | Status: DC

## 2018-01-24 MED ORDER — SODIUM CHLORIDE 0.9 % IJ SOLN
INTRAMUSCULAR | Status: DC | PRN
  Administered 2018-01-24: 50 mL

## 2018-01-24 MED ORDER — SUCCINYLCHOLINE CHLORIDE 200 MG/10ML IV SOSY
PREFILLED_SYRINGE | INTRAVENOUS | Status: AC
  Filled 2018-01-24: qty 10

## 2018-01-24 MED ORDER — SCOPOLAMINE 1 MG/3DAYS TD PT72
1.0000 | MEDICATED_PATCH | TRANSDERMAL | Status: DC
  Administered 2018-01-24: 1.5 mg via TRANSDERMAL
  Filled 2018-01-24: qty 1

## 2018-01-24 MED ORDER — DEXAMETHASONE SODIUM PHOSPHATE 4 MG/ML IJ SOLN: 4.0000 mg | INTRAMUSCULAR | Status: DC

## 2018-01-24 MED ORDER — ONDANSETRON HCL 4 MG/2ML IJ SOLN: 4.0000 mg | Freq: Once | INTRAMUSCULAR | Status: DC | PRN

## 2018-01-24 MED ORDER — ACETAMINOPHEN 500 MG PO TABS
1000.0000 mg | ORAL_TABLET | ORAL | Status: AC
  Administered 2018-01-24: 1000 mg via ORAL
  Filled 2018-01-24: qty 2

## 2018-01-24 MED ORDER — PROPOFOL 10 MG/ML IV BOLUS
INTRAVENOUS | Status: DC | PRN
  Administered 2018-01-24: 150 mg via INTRAVENOUS

## 2018-01-24 MED ORDER — MORPHINE SULFATE (PF) 2 MG/ML IV SOLN: 1.0000 mg | INTRAVENOUS | Status: DC | PRN

## 2018-01-24 MED ORDER — METOPROLOL TARTRATE 25 MG PO TABS
50.0000 mg | ORAL_TABLET | Freq: Two times a day (BID) | ORAL | Status: DC
  Administered 2018-01-24: 50 mg via ORAL
  Filled 2018-01-24: qty 2

## 2018-01-24 MED ORDER — ALBUTEROL SULFATE (2.5 MG/3ML) 0.083% IN NEBU
2.5000 mg | INHALATION_SOLUTION | Freq: Once | RESPIRATORY_TRACT | Status: AC
  Administered 2018-01-24: 2.5 mg via RESPIRATORY_TRACT

## 2018-01-24 MED ORDER — SODIUM CHLORIDE 0.9 % IJ SOLN
INTRAMUSCULAR | Status: AC
  Filled 2018-01-24: qty 50

## 2018-01-24 MED ORDER — WARFARIN - PHARMACIST DOSING INPATIENT
Freq: Every day | Status: DC
  Administered 2018-01-29: 18:00:00

## 2018-01-24 MED ORDER — ALBUTEROL SULFATE HFA 108 (90 BASE) MCG/ACT IN AERS: 2.0000 | INHALATION_SPRAY | Freq: Two times a day (BID) | RESPIRATORY_TRACT | Status: DC | PRN

## 2018-01-24 MED ORDER — DEXAMETHASONE SODIUM PHOSPHATE 10 MG/ML IJ SOLN
INTRAMUSCULAR | Status: DC | PRN
  Administered 2018-01-24: 10 mg via INTRAVENOUS

## 2018-01-24 MED ORDER — SUCCINYLCHOLINE CHLORIDE 200 MG/10ML IV SOSY
PREFILLED_SYRINGE | INTRAVENOUS | Status: DC | PRN
  Administered 2018-01-24: 120 mg via INTRAVENOUS

## 2018-01-24 MED ORDER — LIDOCAINE 20MG/ML (2%) 15 ML SYRINGE OPTIME
INTRAMUSCULAR | Status: DC | PRN
  Administered 2018-01-24: 1.5 mg/kg/h via INTRAVENOUS

## 2018-01-24 MED ORDER — BUPIVACAINE LIPOSOME 1.3 % IJ SUSP
INTRAMUSCULAR | Status: DC | PRN
  Administered 2018-01-24: 20 mL

## 2018-01-24 MED ORDER — LIDOCAINE 2% (20 MG/ML) 5 ML SYRINGE
INTRAMUSCULAR | Status: DC | PRN
  Administered 2018-01-24: 100 mg via INTRAVENOUS

## 2018-01-24 MED ORDER — PROPOFOL 10 MG/ML IV BOLUS
INTRAVENOUS | Status: AC
  Filled 2018-01-24: qty 20

## 2018-01-24 MED ORDER — FAMOTIDINE IN NACL 20-0.9 MG/50ML-% IV SOLN
20.0000 mg | Freq: Two times a day (BID) | INTRAVENOUS | Status: DC
  Administered 2018-01-24 – 2018-01-27 (×7): 20 mg via INTRAVENOUS
  Filled 2018-01-24 (×7): qty 50

## 2018-01-24 MED ORDER — FUROSEMIDE 40 MG PO TABS: 120.0000 mg | ORAL_TABLET | Freq: Every day | ORAL | Status: DC

## 2018-01-24 MED ORDER — ROCURONIUM BROMIDE 100 MG/10ML IV SOLN
INTRAVENOUS | Status: DC | PRN
  Administered 2018-01-24: 10 mg via INTRAVENOUS
  Administered 2018-01-24: 40 mg via INTRAVENOUS
  Administered 2018-01-24: 10 mg via INTRAVENOUS

## 2018-01-24 MED ORDER — PREMIER PROTEIN SHAKE
2.0000 [oz_av] | ORAL | Status: DC
  Administered 2018-01-25 – 2018-01-31 (×52): 2 [oz_av] via ORAL
  Filled 2018-01-24 (×80): qty 325.31

## 2018-01-24 MED ORDER — SIMETHICONE 80 MG PO CHEW: 80.0000 mg | CHEWABLE_TABLET | Freq: Four times a day (QID) | ORAL | Status: DC | PRN

## 2018-01-24 MED ORDER — STERILE WATER FOR IRRIGATION IR SOLN
Status: DC | PRN
  Administered 2018-01-24: 1000 mL

## 2018-01-24 MED ORDER — DEXAMETHASONE SODIUM PHOSPHATE 10 MG/ML IJ SOLN
INTRAMUSCULAR | Status: AC
  Filled 2018-01-24: qty 1

## 2018-01-24 MED ORDER — KETAMINE HCL 10 MG/ML IJ SOLN
INTRAMUSCULAR | Status: DC | PRN
  Administered 2018-01-24 (×3): 10 mg via INTRAVENOUS

## 2018-01-24 MED ORDER — ENOXAPARIN SODIUM 40 MG/0.4ML ~~LOC~~ SOLN
40.0000 mg | SUBCUTANEOUS | Status: AC
  Administered 2018-01-24: 40 mg via SUBCUTANEOUS
  Filled 2018-01-24: qty 0.4

## 2018-01-24 MED ORDER — ONDANSETRON HCL 4 MG/2ML IJ SOLN
INTRAMUSCULAR | Status: DC | PRN
  Administered 2018-01-24: 4 mg via INTRAVENOUS

## 2018-01-24 MED ORDER — KETAMINE HCL 10 MG/ML IJ SOLN
INTRAMUSCULAR | Status: AC
  Filled 2018-01-24: qty 1

## 2018-01-24 MED ORDER — LOSARTAN POTASSIUM 50 MG PO TABS
50.0000 mg | ORAL_TABLET | Freq: Every day | ORAL | Status: DC
  Administered 2018-01-24: 50 mg via ORAL
  Filled 2018-01-24: qty 1

## 2018-01-24 MED ORDER — GABAPENTIN 300 MG PO CAPS
300.0000 mg | ORAL_CAPSULE | ORAL | Status: AC
  Administered 2018-01-24: 300 mg via ORAL
  Filled 2018-01-24: qty 1

## 2018-01-24 MED ORDER — HEPARIN (PORCINE) IN NACL 100-0.45 UNIT/ML-% IJ SOLN
1600.0000 [IU]/h | INTRAMUSCULAR | Status: DC
  Administered 2018-01-24: 1600 [IU]/h via INTRAVENOUS
  Filled 2018-01-24: qty 250

## 2018-01-24 MED ORDER — ONDANSETRON HCL 4 MG/2ML IJ SOLN: 4.0000 mg | Freq: Four times a day (QID) | INTRAMUSCULAR | Status: DC | PRN

## 2018-01-24 MED ORDER — HEPARIN BOLUS VIA INFUSION
6000.0000 [IU] | Freq: Once | INTRAVENOUS | Status: AC
  Administered 2018-01-24: 6000 [IU] via INTRAVENOUS
  Filled 2018-01-24: qty 6000

## 2018-01-24 MED ORDER — FUROSEMIDE 40 MG PO TABS
80.0000 mg | ORAL_TABLET | Freq: Every day | ORAL | Status: DC
  Administered 2018-01-24: 80 mg via ORAL
  Filled 2018-01-24: qty 2

## 2018-01-24 MED ORDER — MORPHINE SULFATE (PF) 2 MG/ML IV SOLN
1.0000 mg | INTRAVENOUS | Status: DC | PRN
  Administered 2018-01-24: 1 mg via INTRAVENOUS
  Administered 2018-01-25: 2 mg via INTRAVENOUS
  Filled 2018-01-24 (×2): qty 1

## 2018-01-24 MED ORDER — ACETAMINOPHEN 160 MG/5ML PO SOLN
650.0000 mg | Freq: Four times a day (QID) | ORAL | Status: DC
  Administered 2018-01-24 – 2018-01-31 (×27): 650 mg via ORAL
  Filled 2018-01-24 (×28): qty 20.3

## 2018-01-24 MED ORDER — LIDOCAINE 2% (20 MG/ML) 5 ML SYRINGE
INTRAMUSCULAR | Status: AC
  Filled 2018-01-24: qty 5

## 2018-01-24 MED ORDER — APREPITANT 40 MG PO CAPS
40.0000 mg | ORAL_CAPSULE | ORAL | Status: AC
  Administered 2018-01-24: 40 mg via ORAL
  Filled 2018-01-24: qty 1

## 2018-01-24 MED ORDER — OXYCODONE HCL 5 MG/5ML PO SOLN
5.0000 mg | ORAL | Status: DC | PRN
  Administered 2018-01-25: 5 mg via ORAL
  Filled 2018-01-24: qty 10

## 2018-01-24 MED ORDER — ALBUTEROL SULFATE (2.5 MG/3ML) 0.083% IN NEBU
INHALATION_SOLUTION | RESPIRATORY_TRACT | Status: AC
  Filled 2018-01-24: qty 3

## 2018-01-24 MED ORDER — ROCURONIUM BROMIDE 100 MG/10ML IV SOLN
INTRAVENOUS | Status: AC
  Filled 2018-01-24: qty 1

## 2018-01-24 MED ORDER — FUROSEMIDE 40 MG PO TABS: 80.0000 mg | ORAL_TABLET | ORAL | Status: DC

## 2018-01-24 MED ORDER — SUGAMMADEX SODIUM 500 MG/5ML IV SOLN
INTRAVENOUS | Status: AC
  Filled 2018-01-24: qty 5

## 2018-01-24 MED ORDER — WARFARIN SODIUM 5 MG PO TABS
7.5000 mg | ORAL_TABLET | Freq: Once | ORAL | Status: AC
  Administered 2018-01-24: 7.5 mg via ORAL
  Filled 2018-01-24: qty 1

## 2018-01-24 MED ORDER — KCL IN DEXTROSE-NACL 20-5-0.45 MEQ/L-%-% IV SOLN
INTRAVENOUS | Status: DC
  Administered 2018-01-24 – 2018-01-25 (×2): via INTRAVENOUS
  Filled 2018-01-24 (×2): qty 1000

## 2018-01-24 MED ORDER — LACTATED RINGERS IR SOLN
Status: DC | PRN
  Administered 2018-01-24: 1000 mL

## 2018-01-24 MED ORDER — MIDAZOLAM HCL 2 MG/2ML IJ SOLN
INTRAMUSCULAR | Status: AC
  Filled 2018-01-24: qty 2

## 2018-01-24 MED ORDER — LACTATED RINGERS IV SOLN
INTRAVENOUS | Status: DC
  Administered 2018-01-24: 07:00:00 via INTRAVENOUS

## 2018-01-24 MED ORDER — LIDOCAINE HCL 2 % IJ SOLN
INTRAMUSCULAR | Status: AC
  Filled 2018-01-24: qty 20

## 2018-01-24 MED ORDER — FENTANYL CITRATE (PF) 100 MCG/2ML IJ SOLN: 25.0000 ug | INTRAMUSCULAR | Status: DC | PRN

## 2018-01-24 MED ORDER — FENTANYL CITRATE (PF) 100 MCG/2ML IJ SOLN
INTRAMUSCULAR | Status: AC
  Filled 2018-01-24: qty 2

## 2018-01-24 SURGICAL SUPPLY — 67 items
APPLIER CLIP ROT 10 11.4 M/L (STAPLE)
APPLIER CLIP ROT 13.4 12 LRG (CLIP)
BANDAGE ADH SHEER 1  50/CT (GAUZE/BANDAGES/DRESSINGS) ×3 IMPLANT
BENZOIN TINCTURE PRP APPL 2/3 (GAUZE/BANDAGES/DRESSINGS) ×3 IMPLANT
BLADE SURG SZ11 CARB STEEL (BLADE) ×3 IMPLANT
CABLE HIGH FREQUENCY MONO STRZ (ELECTRODE) ×3 IMPLANT
CHLORAPREP W/TINT 26ML (MISCELLANEOUS) ×6 IMPLANT
CLIP APPLIE ROT 10 11.4 M/L (STAPLE) IMPLANT
CLIP APPLIE ROT 13.4 12 LRG (CLIP) IMPLANT
CLOSURE WOUND 1/2 X4 (GAUZE/BANDAGES/DRESSINGS) ×1
COVER SURGICAL LIGHT HANDLE (MISCELLANEOUS) ×3 IMPLANT
DECANTER SPIKE VIAL GLASS SM (MISCELLANEOUS) ×3 IMPLANT
DEVICE SUT QUICK LOAD TK 5 (STAPLE) ×2 IMPLANT
DEVICE SUT TI-KNOT TK 5X26 (MISCELLANEOUS) ×2 IMPLANT
DEVICE SUTURE ENDOST 10MM (ENDOMECHANICALS) ×3 IMPLANT
DEVICE TI KNOT TK5 (MISCELLANEOUS) ×1
DISSECTOR BLUNT TIP ENDO 5MM (MISCELLANEOUS) IMPLANT
DRAPE UTILITY XL STRL (DRAPES) ×6 IMPLANT
ELECT L-HOOK LAP 45CM DISP (ELECTROSURGICAL)
ELECT PENCIL ROCKER SW 15FT (MISCELLANEOUS) IMPLANT
ELECT REM PT RETURN 15FT ADLT (MISCELLANEOUS) ×3 IMPLANT
ELECTRODE L-HOOK LAP 45CM DISP (ELECTROSURGICAL) IMPLANT
GAUZE SPONGE 2X2 8PLY STRL LF (GAUZE/BANDAGES/DRESSINGS) ×1 IMPLANT
GAUZE SPONGE 4X4 12PLY STRL (GAUZE/BANDAGES/DRESSINGS) IMPLANT
GLOVE BIO SURGEON STRL SZ7.5 (GLOVE) ×3 IMPLANT
GLOVE INDICATOR 8.0 STRL GRN (GLOVE) ×3 IMPLANT
GOWN STRL REUS W/TWL XL LVL3 (GOWN DISPOSABLE) ×9 IMPLANT
GRASPER SUT TROCAR 14GX15 (MISCELLANEOUS) ×3 IMPLANT
HOVERMATT SINGLE USE (MISCELLANEOUS) ×3 IMPLANT
KIT BASIN OR (CUSTOM PROCEDURE TRAY) ×3 IMPLANT
MARKER SKIN DUAL TIP RULER LAB (MISCELLANEOUS) ×3 IMPLANT
NEEDLE SPNL 22GX3.5 QUINCKE BK (NEEDLE) ×3 IMPLANT
PACK UNIVERSAL I (CUSTOM PROCEDURE TRAY) ×3 IMPLANT
QUICK LOAD TK 5 (STAPLE) ×1
RELOAD STAPLER 60MM BLK (STAPLE) IMPLANT
RELOAD STAPLER BLUE 60MM (STAPLE) ×3 IMPLANT
RELOAD STAPLER GOLD 60MM (STAPLE) ×1 IMPLANT
RELOAD STAPLER GREEN 60MM (STAPLE) ×1 IMPLANT
SCISSORS LAP 5X45 EPIX DISP (ENDOMECHANICALS) IMPLANT
SEALANT SURGICAL APPL DUAL CAN (MISCELLANEOUS) IMPLANT
SET IRRIG TUBING LAPAROSCOPIC (IRRIGATION / IRRIGATOR) ×3 IMPLANT
SHEARS HARMONIC ACE PLUS 45CM (MISCELLANEOUS) ×3 IMPLANT
SLEEVE GASTRECTOMY 40FR VISIGI (MISCELLANEOUS) ×3 IMPLANT
SLEEVE XCEL OPT CAN 5 100 (ENDOMECHANICALS) ×9 IMPLANT
SOLUTION ANTI FOG 6CC (MISCELLANEOUS) ×3 IMPLANT
SPONGE GAUZE 2X2 STER 10/PKG (GAUZE/BANDAGES/DRESSINGS) ×2
SPONGE LAP 18X18 RF (DISPOSABLE) ×3 IMPLANT
STAPLER ECHELON BIOABSB 60 FLE (MISCELLANEOUS) ×15 IMPLANT
STAPLER ECHELON LONG 60 440 (INSTRUMENTS) ×3 IMPLANT
STAPLER RELOAD 60MM BLK (STAPLE)
STAPLER RELOAD BLUE 60MM (STAPLE) ×9
STAPLER RELOAD GOLD 60MM (STAPLE) ×3
STAPLER RELOAD GREEN 60MM (STAPLE) ×3
STRIP CLOSURE SKIN 1/2X4 (GAUZE/BANDAGES/DRESSINGS) ×2 IMPLANT
SUT MNCRL AB 4-0 PS2 18 (SUTURE) ×3 IMPLANT
SUT SURGIDAC NAB ES-9 0 48 120 (SUTURE) ×3 IMPLANT
SUT VICRYL 0 TIES 12 18 (SUTURE) ×3 IMPLANT
SYR 20CC LL (SYRINGE) ×3 IMPLANT
SYR 50ML LL SCALE MARK (SYRINGE) ×3 IMPLANT
TOWEL OR 17X26 10 PK STRL BLUE (TOWEL DISPOSABLE) ×3 IMPLANT
TOWEL OR NON WOVEN STRL DISP B (DISPOSABLE) ×3 IMPLANT
TROCAR BLADELESS 15MM (ENDOMECHANICALS) ×3 IMPLANT
TROCAR BLADELESS OPT 5 100 (ENDOMECHANICALS) ×3 IMPLANT
TUBING CONNECTING 10 (TUBING) ×4 IMPLANT
TUBING CONNECTING 10' (TUBING) ×2
TUBING ENDO SMARTCAP (MISCELLANEOUS) ×3 IMPLANT
TUBING INSUF HEATED (TUBING) ×3 IMPLANT
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
## 2018-01-24 NOTE — Op Note (Signed)
Preoperative diagnosis: laparoscopic sleeve gastrectomy  Postoperative diagnosis: Same   Procedure: Upper endoscopy   Surgeon: Ramere Downs, M.D.  Anesthesia: Gen.   Indications for procedure: This patient was undergoing a laparoscopic sleeve gastrectomy.   Description of procedure: The endoscopy was placed in the mouth and into the oropharynx and under endoscopic vision it was advanced to the esophagogastric junction. The pouch was insufflated and no bleeding or bubbles were seen. The GEJ was identified at 40cm from the teeth.  No bleeding or leaks were detected. The scope was withdrawn without difficulty.   [name], M.D. General, Bariatric, & Minimally Invasive Surgery [facility]    

## 2018-01-24 NOTE — Transfer of Care (Signed)
Immediate Anesthesia Transfer of Care Note  Patient: ERINNE GILLENTINE  Procedure(s) Performed: LAPAROSCOPIC GASTRIC SLEEVE RESECTION WITH UPPER ENDO AND HIATAL HERNIA REPAIR (N/A )  Patient Location: PACU  Anesthesia Type:General  Level of Consciousness: awake, alert , oriented and patient cooperative  Airway & Oxygen Therapy: Patient Spontanous Breathing and Patient connected to face mask oxygen  Post-op Assessment: Report given to RN and Post -op Vital signs reviewed and stable  Post vital signs: Reviewed and stable  Last Vitals:  Vitals Value Taken Time  BP 157/80 01/24/2018  9:32 AM  Temp    Pulse 58 01/24/2018  9:35 AM  Resp 17 01/24/2018  9:35 AM  SpO2 93 % 01/24/2018  9:35 AM  Vitals shown include unvalidated device data.  Last Pain:  Vitals:   01/24/18 0626  TempSrc:   PainSc: 0-No pain      Patients Stated Pain Goal: 4 (89/38/10 1751)  Complications: No apparent anesthesia complications

## 2018-01-24 NOTE — Progress Notes (Signed)
Discussed post op day goals with patient, mother, and bedside RN including mobility, IS, diet progression, pain, and nausea control.  Questions answered.

## 2018-01-24 NOTE — Progress Notes (Signed)
Pt has never worn CPAP qhs despite recent sleep study.   Pt does not want to wear at this time.

## 2018-01-24 NOTE — Progress Notes (Signed)
Spoke with Dr Johnsie Cancel  He prefers heparin/coumadin over lovenox/coumadin  i'm ok with starting heparin gtt this evening at 2000 and i'm ok with heparin bolus.   [name]. [name], MD, FACS General, Bariatric, & Minimally Invasive Surgery [facility], [place]

## 2018-01-24 NOTE — H&P (Signed)
[name] Documented: 12/22/2017 3:16 PM Location: [facility] Patient #: [id number] DOB: 04/18/1961 Single / Language: English / Race: White Female   History of Present Illness (Diana Magallon M. [name] MD; 12/23/2017 8:47 AM) The patient is a 57 year old female who presents for a bariatric surgery evaluation. She comes back in for long-term follow-up regarding her obesity as well as evaluation for weight loss surgery. I initially met her in October 2018. She has completed supervised weight loss. She has been evaluated and cleared by psychology. She denies any medical changes since I last saw her in October. She denies any hospitalizations or trips to the emergency room. She is still wearing oxygen daily. She had her upper GI which showed a small hiatal hernia. Her ultrasound showed fatty liver and some gallstones but denies postprandial epigastric or right upper quadrant pain. She sees pulmonary and cardiology regularly. She also saw nephrology for clearance for surgery. Dr. Patel thought that from a nephrology standpoint she was safe for bariatric surgery. He thought that she would benefit from bariatric surgery. She did see cardiology and was felt to be at moderate risk. Because of the need to hold her anticoagulation perioperatively for short time she is at risk for embolic stroke due to her underlying mechanical valves. I also talked with her pulmonologist Dr. McQuaid if he felt that she would benefit from weight loss surgery and that from a pulmonary standpoint he did not expect a prolonged intubation or potential need for tracheostomy. She had recently had pulmonary function tests which were stable for her. She would need to be bridged on Lovenox per cardiology. She would need to be on Lovenox up to 12 hours before surgery and restarted on therapeutic Lovenox as soon as possible after surgery.  She denies any chest tightness or angina or TIAs or amaurosis fugax. She denies  any GERD or heartburn or indigestion. She does do her own shopping at the store. She walks with a cart at the store and may have to stop once while shopping to rest.  Bariatric evaluation labs showed a creatinine of 1.3, hemoglobin A1c of 5.9. Total cholesterol 181. Triglyceride level 245 otherwise bariatric evaluation labs were unremarkable.    OCTOBER 2018 - She is referred by [name] for evaluation of weight loss surgery. She attended our seminar in person. She is undecided but is interested in a gastric bypass. She is interested in improving her overall health. One of her main goals is to be able to get off of oxygen.  Despite several attempts for sustained weight loss she has been unsuccessful. She has tried the DASH diet, Atkins, and my fitness pal. She was able to lose 60 pounds with my fitness pal but regained the weight.  Her comorbidities are extensive. 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  She denies any chest pain, chest pressure, or shortness of breath at rest. She does get some shortness of breath with physical activity. She denies any orthopnea or paroxysmal nocturnal dyspnea. She does have chronic lymphedema in her right lower extremity and she has some occasional cellulitis in her left lower extremity. She had a recent admission for acute on chronic diastolic heart failure in September. She has followed up with cardiology and pulmonary since then. She denies any personal or family history of blood clots. She generally has to go on   prednisone 3 times a year for respiratory flare. She had a recent sleep study that showed no significant obstructive sleep apnea. She has been oxygen dependent since 2016. She denies any heartburn, reflux or indigestion. She has a daily bowel  movement. She had what sounds like a CT colonoscopy. She has gone through menopause. She denies any recent issues with venous stasis ulcers. She has bilateral knee pain as well as central low back pain without sciatica. She denies any history of diabetes, TIAs or amaurosis fugax. She did have an episode of Bell's palsy back in late summer. She is a former smoker who quit in 2015.  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 Her hematologist is Dr. [name]   Problem List/Past Medical [name]. [name], MD; 12/23/2017 8:54 AM) CHRONIC KIDNEY DISEASE (N18.9)  CHRONIC ANTICOAGULATION (Z79.01)  HIATAL HERNIA (K44.9)  MORBID OBESITY WITH BMI OF 50.0-59.9, ADULT (E66.01)  MECHANICAL HEART VALVE PRESENT (Z95.2)   Past Surgical History [name] [name], MD; 12/23/2017 8:54 AM) Cesarean Section - 1  Tonsillectomy  Valve Replacement   Diagnostic Studies History [name] [name], MD; 12/23/2017 8:54 AM) Colonoscopy  1-5 years ago Mammogram  within last year Pap Smear  1-5 years ago  Allergies [name], RN; 12/22/2017 3:16 PM) No Known Allergies [03/30/2017]: Allergies Reconciled   Medication History [name] [name], MD; 12/23/2017 8:54 AM) Allopurinol (300MG Tablet, Oral) Active. Warfarin Sodium (7.5MG Tablet, Oral) Active. Ventolin HFA (108 (90 Base)MCG/ACT Aerosol Soln, Inhalation) Active. Symbicort (80-4.5MCG/ACT Aerosol, Inhalation) Active. Baby Aspirin (81MG Tablet Chewable, Oral) Active. BuPROPion HCl ER (XL) (300MG Tablet ER 24HR, Oral) Active. Cetirizine HCl (10MG Tablet, Oral) Active. Citalopram Hydrobromide (20MG Tablet, Oral) Active. Furosemide (40MG Tablet, Oral) Active. Potassium Chloride (20MEQ Tablet ER, Oral) Active. Multivitamin Adult (Oral) Active. Losartan Potassium (50MG Tablet, Oral) Active. Metoprolol Tartrate (50MG Tablet, Oral) Active. Pravastatin Sodium (80MG Tablet, Oral)  Active. Montelukast Sodium (10MG Tablet, Oral) Active. Vitamin D3 (2000UNIT Tablet, Oral) Active. Medications Reconciled Spironolactone (25MG Tablet, Oral) Active. Acetaminophen (650MG Tablet, Oral) Active. Oxygen Permeable Lens Products Active. Nasacort AQ (55MCG/ACT Aerosol, Nasal) Active.  Social History [name] [name], MD; 12/23/2017 8:54 AM) Alcohol use  Remotely quit alcohol use. Illicit drug use  Remotely quit drug use. No caffeine use  Tobacco use  Former smoker.  Family History [name] [name], MD; 12/23/2017 8:54 AM) Diabetes Mellitus  Brother. Heart Disease  Brother, Father, Mother, Sister. Heart disease in female family member before age 41  Heart disease in female family member before age 67  Hypertension  Brother, Father, Mother, Sister. Kidney Disease  Father. Respiratory Condition  Mother.  Pregnancy / Birth History [name] [name], MD; 12/23/2017 8:54 AM) Age at menarche  28 years. Age of menopause  38-50 Contraceptive History  Intrauterine device. Gravida  2 Irregular periods  Maternal age  12-20 Para  1  Other Problems [name] [name], MD; 12/23/2017 8:54 AM) Heart murmur  HYPERCHOLESTEROLEMIA (E78.00)  ESSENTIAL HYPERTENSION (I10)  Home Oxygen Use  INTERSTITIAL LUNG DISEASE (M35.3)  DIASTOLIC CHF, CHRONIC (I14.43)     Review of Systems [name] Cyana Shook MD; 12/23/2017 8:42 AM) General Present- Weight Gain. Not Present- Appetite Loss, Chills, Fatigue, Fever, Night Sweats and Weight Loss. Skin Not Present- Change in Wart/Mole, Dryness, Hives, Jaundice, New Lesions, Non-Healing Wounds, Rash and Ulcer. HEENT Present- Seasonal Allergies and Wears glasses/contact lenses. Not Present- Earache, Hearing Loss, Hoarseness, Nose Bleed, Oral Ulcers, Ringing in the Ears, Sinus Pain,  Sore Throat, Visual Disturbances and Yellow Eyes. Respiratory Present- Difficulty Breathing. Not Present- Bloody sputum, Chronic Cough, Snoring and  Wheezing. Breast Not Present- Breast Mass, Breast Pain, Nipple Discharge and Skin Changes. Cardiovascular Present- Shortness of Breath. Not Present- Chest Pain, Difficulty Breathing Lying Down, Leg Cramps, Palpitations, Rapid Heart Rate and Swelling of Extremities. Gastrointestinal Not Present- Abdominal Pain, Bloating, Bloody Stool, Change in Bowel Habits, Chronic diarrhea, Constipation, Difficulty Swallowing, Excessive gas, Gets full quickly at meals, Hemorrhoids, Indigestion, Nausea, Rectal Pain and Vomiting. Female Genitourinary Present- Frequency. Not Present- Nocturia, Painful Urination, Pelvic Pain and Urgency. Musculoskeletal Present- Joint Stiffness. Not Present- Back Pain, Joint Pain, Muscle Pain, Muscle Weakness and Swelling of Extremities. Neurological Not Present- Decreased Memory, Fainting, Headaches, Numbness, Seizures, Tingling, Tremor, Trouble walking and Weakness. Psychiatric Present- Depression. Not Present- Anxiety, Bipolar, Change in Sleep Pattern, Fearful and Frequent crying. Endocrine Not Present- Cold Intolerance, Excessive Hunger, Hair Changes, Heat Intolerance, Hot flashes and New Diabetes. Hematology Present- Blood Thinners. Not Present- Easy Bruising, Excessive bleeding, Gland problems, HIV and Persistent Infections.  Vitals (Diane Herrin RN; 12/22/2017 3:21 PM) 12/22/2017 3:20 PM Weight: 345 lb Height: 64in Body Surface Area: 2.47 m Body Mass Index: 59.22 kg/m  Temp.: 97.57F(Oral)  Pulse: 70 (Regular)  P.OX: 87% (Room air) BP: 144/80 (Sitting, Left Arm, Standard)       Physical Exam [name] Samira Acero MD; 12/23/2017 8:42 AM) General Mental Status-Alert. General Appearance-Consistent with stated age. Hydration-Well hydrated. Voice-Normal. Note: morbidly obese, evenly distributed; On Oxygen   Integumentary Note: chronic lymphedema of RLE - skin thickening, no active ulcers; LLE = some mild chronic lymphedema, brawny skin   Head and  Neck Head-normocephalic, atraumatic with no lesions or palpable masses. Trachea-midline. Thyroid Gland Characteristics - normal size and consistency.  Eye Eyeball - Bilateral-Extraocular movements intact. Sclera/Conjunctiva - Bilateral-No scleral icterus.  ENMT Mouth and Throat -Note: lips intact.  Note: normal ext ears   Chest and Lung Exam Chest and lung exam reveals -quiet, even and easy respiratory effort with no use of accessory muscles and on auscultation, normal breath sounds, no adventitious sounds and normal vocal resonance. Inspection Chest Wall - Normal. Back - normal.  Breast - Did not examine.  Cardiovascular Cardiovascular examination reveals -normal heart sounds, regular rate and rhythm with no murmurs and normal pedal pulses bilaterally. Note: +click Note: old sternal incision   Abdomen Inspection Inspection of the abdomen reveals - No Hernias. Skin - Scar - no surgical scars. Palpation/Percussion Palpation and Percussion of the abdomen reveal - Soft, Non Tender, No Rebound tenderness, No Rigidity (guarding) and No hepatosplenomegaly. Auscultation Auscultation of the abdomen reveals - Bowel sounds normal.  Peripheral Vascular Upper Extremity Palpation - Pulses bilaterally normal.  Neurologic Neurologic evaluation reveals -alert and oriented x 3 with no impairment of recent or remote memory. Mental Status-Normal.  Neuropsychiatric The patient's mood and affect are described as -normal. Judgment and Insight-insight is appropriate concerning matters relevant to self.  Musculoskeletal Normal Exam - Left-Upper Extremity Strength Normal and Lower Extremity Strength Normal. Normal Exam - Right-Upper Extremity Strength Normal and Lower Extremity Strength Normal.  Lymphatic Head & Neck  General Head & Neck Lymphatics: Bilateral - Description - Normal. Axillary - Did not examine. Femoral & Inguinal - Did not  examine.    Assessment & Plan [name] Ethylene Reznick MD; 12/23/2017 8:54 AM) MORBID OBESITY WITH BMI OF 50.0-59.9, ADULT (E66.01) Impression: The patient meets weight loss surgery criteria. I think the patient would be an acceptable candidate for Laparoscopic vertical  sleeve gastrectomy. She understands that she is at increased risk because of her cardiac history, need for aggressive anticoagulation, and pulmonary status.  We briefly rediscussed laparoscopic sleeve gastrectomy. I offered to go over risk and benefits again but she declined. She stated that we had an extensive conversation at the previous appointment and she is done additional reading and research. We discussed long-term risk of sleeve gastrectomy being reflux. She was given a copy of the surgical consent and read over it. She had no additional questions or concerns. Current Plans Pt Education - EMW_preopbariatric MECHANICAL HEART VALVE PRESENT (Z95.2) Impression: Because of her mechanical valve she is on aggressive anticoagulation. We did explain that this will raise her risk for bleeding postoperatively since we will need to be aggressive with her anticoagulation because of her mechanical valves. coumadin clinic will instruct pt on when to stop coumadin and start/stop lovenox. CHRONIC KIDNEY DISEASE (N18.9) Impression: recd clearance from Dr Patel for surgery CHRONIC ANTICOAGULATION (Z79.01) ESSENTIAL HYPERTENSION (I10) HYPERCHOLESTEROLEMIA (E78.00) DIASTOLIC CHF, CHRONIC (I50.32) Impression: moderate risk from CV standpoint per cards. I discussed with her cardiology's assessment and thoughts that she would be moderate risk for surgery. We discussed that that could involve irregular heartbeat, MI, or stroke. Understanding this the patient still wishes to proceed with surgery. Since she understands the risk I'm willing to proceed with weight loss surgery. INTERSTITIAL LUNG DISEASE (J84.9) Impression: per dr McQuaid - "I discussed her  situation with Dr. Jorgen Wolfinger today. I know her quite well. 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 This has been stable since she had valve replacement surgery several years ago. She has been left with a need for chronic oxygen since then. We know that some patients who have significant increased pulmonary venous pressure for long periods of time can have a permanent NSIP type picture in the lungs. Fortunately for her hers is mild and has not progressed based on recent lung function testing from this year. While she is at slightly increased risk for a respiratory complication around the time of surgery I do not think that her respiratory issues are prohibitive for gastric sleeve surgery. I recommend that she proceed because I think it will overall be quite helpful for her." HIATAL HERNIA (K44.9) Impression: We discussed the findings of a small hiatal hernia on upper GI. I recommended that we will test for one intraoperatively. If found to have a clinically significant hiatal hernia I recommended repair. We discussed what that would involve.  Benen Weida M. Bransen Fassnacht, MD, FACS General, Bariatric, & Minimally Invasive Surgery [facility]   

## 2018-01-24 NOTE — Anesthesia Postprocedure Evaluation (Signed)
Anesthesia Post Note  Patient: DEARA BOBER  Procedure(s) Performed: LAPAROSCOPIC GASTRIC SLEEVE RESECTION WITH UPPER ENDO AND HIATAL HERNIA REPAIR (N/A )     Patient location during evaluation: PACU Anesthesia Type: General Level of consciousness: awake and alert Pain management: pain level controlled Vital Signs Assessment: post-procedure vital signs reviewed and stable Respiratory status: spontaneous breathing, nonlabored ventilation, respiratory function stable and patient connected to nasal cannula oxygen Cardiovascular status: blood pressure returned to baseline and stable Postop Assessment: no apparent nausea or vomiting Anesthetic complications: no    Last Vitals:  Vitals:   01/24/18 1047 01/24/18 1100  BP: 140/60   Pulse: 64 (!) 59  Resp: 18 16  Temp: 36.6 C   SpO2: 90% 91%    Last Pain:  Vitals:   01/24/18 1146  TempSrc:   PainSc: 5                  Tiajuana Amass

## 2018-01-24 NOTE — Interval H&P Note (Signed)
History and Physical Interval Note:  01/24/2018 7:22 AM  [name]  has presented today for surgery, with the diagnosis of MORBID OBESITY  The various methods of treatment have been discussed with the patient and family. After consideration of risks, benefits and other options for treatment, the patient has consented to  Procedure(s): LAPAROSCOPIC GASTRIC SLEEVE RESECTION WITH UPPER ENDO AND ERAS PATHWAY (N/A) as a surgical intervention .  The patient's history has been reviewed, patient examined, no change in status, stable for surgery.  I have reviewed the patient's chart and labs.  Questions were answered to the patient's satisfaction.    rediscussed risk of bleeding/stroke with pt and her sister Has been doing therapeutic lovenox injections - last dose was yesterday Last coumadin was last Wednesday  Greer Pickerel

## 2018-01-24 NOTE — Progress Notes (Signed)
ANTICOAGULATION CONSULT NOTE - Initial Consult  Pharmacy Consult for Heparin, Warfarin Indication: mechanical MVR, mechanical AVR  No Known Allergies  Patient Measurements: Height: 5\' 5"  (165.1 cm) Weight: (!) 336 lb (152.4 kg) IBW/kg (Calculated) : 57 Heparin Dosing Weight: 96 kg  Vital Signs: Temp: 97.8 F (36.6 C) (08/27 1047) Temp Source: Oral (08/27 0558) BP: 140/60 (08/27 1047) Pulse Rate: 59 (08/27 1100)  Labs: Recent Labs    01/24/18 0607 01/24/18 1004  HGB  --  14.0  HCT  --  42.7  APTT 42*  --   LABPROT 14.0  --   INR 1.08  --     Estimated Creatinine Clearance: 66.8 mL/min (A) (by C-G formula based on SCr of 1.38 mg/dL (H)).   Medical History: Past Medical History:  Diagnosis Date  . Allergic rhinitis   . Anemia   . Anxiety   . Aortic stenosis   . Arthritis    "lower back" (11/30/2016)  . CAO (chronic airflow obstruction) (HCC)   . Cellulitis of left lower extremity 11/30/2016  . CHF (congestive heart failure) (HCC)    hx of  . Coronary artery disease   . Depressive disorder   . Dysrhythmia    atrial fib/ flutter  . Gout   . History of blood transfusion 03/2016   "I was anemic"  . History of hiatal hernia   . HTN (hypertension)   . Hyperlipidemia   . Hypertriglyceridemia   . Kidney disease    Dr. Graylon Gunning  . Lymphedema    Right leg  . Menopausal symptoms   . Mitral and aortic heart valve diseases, unspecified 07/2014   s/p AVR with #19 St Jude and s/p MVR with 85mm St. Jude per [name] at [facility]  . Mitral stenosis   . Morbid obesity ([place])   . Noninfectious lymphedema   . On home oxygen therapy    "2-3L when I'm up doing a whole lot" (11/30/2016)  . Renal insufficiency   . Shortness of breath    with exertion  . Sleep apnea    Mar 01 2017 sleep study negative per pt. No mask worn ever  . Undiagnosed cardiac murmurs   . Vitamin D deficiency     Medications:  Scheduled:  . acetaminophen (TYLENOL) oral liquid 160 mg/5 mL  650 mg Oral  Q6H  . albuterol      . [START ON 01/25/2018] furosemide  120 mg Oral Daily  . furosemide  80 mg Oral q1800  . gabapentin  200 mg Oral Q12H  . losartan  50 mg Oral QHS  . metoprolol tartrate  50 mg Oral BID  . mometasone-formoterol  2 puff Inhalation BID  . [START ON 01/25/2018] protein supplement shake  2 oz Oral Q2H   Infusions:  . dextrose 5 % and 0.45 % NaCl with KCl 20 mEq/L 50 mL/hr at 01/24/18 1135  . famotidine (PEPCID) IV Stopped (01/24/18 1209)    Assessment: 68 yoF admitted on 8/27 for bariatric surgery, s/p laparoscopic sleeve gastrectomy.  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  Pharmacy is consulted to resume warfarin dosing and begin bridging with Heparin IV (OK to bolus) instead of planned Lovenox due to bleeding risk.   PTA Lovenox 150mg  Yates q12h (Started 8/19, last dose on 8/26 at 0730)  PTA Warfarin 7.5 mg daily except 3.75 mg on Wed/Fridays (Last dose on 8/21.  INR subtherapeutic at 2.3 (goal INR 2.5-3.5) on 8/19 at coumadin clinic, but previously therapeutic on 7/31 and 7/10.)  Pre-op Lovenox 40mg  [place] given on 8/27 at 06:30.  Today, 01/24/2018: INR 1.08 CBC: Hgb 14, HCT 42.7 No major drug-drug interactions.  Previously on amiodarone, but didn't tolerate, and NOT taking currently.  Diet: Bariatric clear liquids. Post-op course has been notable for some hypoxia.    Goal of Therapy:  INR 2-3 Heparin level 0.3-0.7 units/ml Monitor platelets by anticoagulation protocol: Yes   Plan:   Warfarin 7.5 mg PO today at 20:00  Give heparin 6000 units bolus IV x 1 at 20:00  Start heparin IV infusion at 1600 units/hr at 20:00  Heparin level 6 hours after starting  Daily heparin level and CBC  Continue to monitor for s/s postop bleeding.    Gretta Arab PharmD, BCPS Pager [phone] 01/24/2018,11:53 AM

## 2018-01-24 NOTE — Op Note (Signed)
01/24/2018 [name] [birth year] [id number]   PRE-OPERATIVE DIAGNOSIS:     Severe obesity BMI 11   Essential hypertension   Hyperlipidemia   ILD (interstitial lung disease) ([facility])   History of mitral valve replacement with mechanical valve   CKD (chronic kidney disease) stage 3, GFR 30-59 ml/min (HCC)   Obstructive sleep apnea   Chronic acquired lymphedema - Right lower extremity   Hx of mechanical aortic valve replacement   POST-OPERATIVE DIAGNOSIS:  Same + small hiatal hernia   PROCEDURE:  Procedure(s): LAPAROSCOPIC SLEEVE GASTRECTOMY WITH HIATAL HERNIA REPAIR UPPER GI ENDOSCOPY  SURGEON:  Surgeon(s): Gayland Curry, MD FACS FASMBS  ASSISTANTS: Gurney Maxin MD   ANESTHESIA:   general  DRAINS: none   BOUGIE: 40 fr ViSiGi  LOCAL MEDICATIONS USED:   Exparel  EBL: 25 cc  SPECIMEN:  Source of Specimen:  Greater curvature of stomach  DISPOSITION OF SPECIMEN:  PATHOLOGY  COUNTS:  YES  INDICATION FOR PROCEDURE: This is a very pleasant 57 y.o.-year-old morbidly obese female who has had unsuccessful attempts for sustained weight loss. The patient presents today for a planned laparoscopic sleeve gastrectomy with upper endoscopy. We have discussed the risk and benefits of the procedure extensively preoperatively. Please see my separate notes. She is on systemic anticoagluation because of heart valves. She was placed on a therapeutic lovenox prior to surgery.  She was evaluated by cardiology, pulmonary, and renal prior to surgery.  I had more than one discussion preoperatively with the patient that she was at increased risk for complications given her cardiac and pulmonary history.  She elected to proceed with surgery.  PROCEDURE: After obtaining informed consent and receiving 40 mg of subcutaneous lovenox, the patient was brought to the operating room at [facility] and placed supine on the operating room table. General endotracheal anesthesia was established.  Sequential compression devices were placed. A orogastric tube was placed. The patient's abdomen was prepped and draped in the usual standard surgical fashion. The patient received preoperative IV antibiotic. A surgical timeout was performed. ERAS protocol used.   Access to the abdomen was achieved using a 5 mm 0 laparoscope thru a 5 mm trocar In the left upper Quadrant 2 fingerbreadths below the left subcostal margin using the Optiview technique. Pneumoperitoneum was smoothly established up to 15 mm of mercury. The laparoscope was advanced and the abdominal cavity was surveilled. The patient was then placed in reverse Trendelenburg.   A 5 mm trocar was placed slightly above and to the left of the umbilicus under direct visualization.  The [facility] liver retractor was placed under the left lobe of the liver through a 5 mm trocar incision site in the subxiphoid position.  I did have to reposition the 5 mm trocar in the subxiphoid position because of the size of the left lobe of her liver.  A 5 mm trocar was placed in the lateral right upper quadrant along with a 15 mm trocar in the mid right abdomen. A final 5 mm trocar was placed in the lateral LUQ.  All under direct visualization after exparel had been infiltrated in bilateral lateral upper abdominal walls as a TAP block.  The stomach was inspected. It was completely decompressed and the orogastric tube was removed.  There was no anterior dimple that was obviously visible.  Her preoperative upper GI suggested a small sliding hiatal hernia.  The calibration tube was placed in the oropharynx and guided down into the stomach by the CRNA. 10 mL of  air was insufflated into the calibration balloon. The calibration tubing was then gently pulled back by the CRNA and it slid past the GE junction. At this point the calibration tubing was desufflated and pulled back into the esophagus. This confirmed my suspicion of a clinically significant hiatal hernia. The  gastrohepatic ligament was incised with harmonic scalpel. The right crus was identified. We identified the crossing fat along the right crus. The adipose tissue just above this area was incised with harmonic scalpel. I then bluntly dissected out this area and identified the left crus. There was evidence of a hiatal hernia. I then mobilized the esophagus. The left and right crus were further mobilized with blunt dissection. I was then able to reapproximate the left and right crus with 0 Ethibond using an Endostitch suture device and securing it with a titanium tyknot. We then had the CRNA readvanced the calibration tubing back into the stomach. 10 mL of air was insufflated into the calibration tube balloon. The calibration tube was then gently pulled back and there was resistance at the GE junction. The tube did not slide back up into the esophagus. At this point the calibration tubing was deflated and removed from the patient's body.   We identified the pylorus and measured 6 cm proximal to the pylorus and identified an area of where we would start taking down the short gastric vessels. Harmonic scalpel was used to take down the short gastric vessels along the greater curvature of the stomach. We were able to enter the lesser sac. We continued to march along the greater curvature of the stomach taking down the short gastrics. As we approached the gastrosplenic ligament we took care in this area not to injure the spleen. We were able to take down the entire gastrosplenic ligament. We then mobilized the fundus away from the left crus of diaphragm. There were not any significant posterior gastric avascular attachments. This left the stomach completely mobilized. No vessels had been taken down along the lesser curvature of the stomach.  We then reidentified the pylorus. A 40Fr ViSiGi was then placed in the oropharynx and advanced down into the stomach and placed in the distal antrum and positioned along the lesser  curvature. It was placed under suction which secured the 40Fr ViSiGi in place along the lesser curve. Then using the Ethicon echelon 60 mm stapler with a green load with Seamguard, I placed a stapler along the antrum approximately 5 cm from the pylorus. The stapler was angled so that there is ample room at the angularis incisura. I then fired the first staple load after inspecting it posteriorly to ensure adequate space both anteriorly and posteriorly. At this point I still was not completely past the angularis so with a gold load with Seamguard, I placed the stapler in position just inside the prior stapleline. We then rotated the stomach to insure that there was adequate anteriorly as well as posteriorly. The stapler was then fired.  At this point I used another 60 mm gold load staple cartridge with Seamguard. The echelon stapler was then repositioned with a 60 mm blue load with Seamguard and we continued to march up along the [place]. My assistant was holding traction along the greater curvature stomach along the cauterized short gastric vessels ensuring that the stomach was symmetrically retracted. Prior to each firing of the staple, we rotated the stomach to ensure that there is adequate stomach left.  As we approached the fundus, I used 60 mm blue cartridge with  Seamguard aiming  lateral to the GE junction after mobilizing some of the esophageal fat pad.  The sleeve was inspected. There is no evidence of cork screw. The staple line appeared hemostatic. The CRNA inflated the ViSiGi to the green zone and the upper abdomen was flooded with saline. There were no bubbles. The sleeve was decompressed and the ViSiGi removed. My assistant scrubbed out and performed an upper endoscopy. The sleeve easily distended with air and the scope was easily advanced to the pylorus. There is no evidence of internal bleeding or cork screwing. There was no narrowing at the angularis. There is no evidence of bubbles. Please see his  operative note for further details. The gastric sleeve was decompressed and the endoscope was removed.  The greater curvature the stomach was grasped with a laparoscopic grasper and removed from the 15 mm trocar site.  The liver retractor was removed. I then closed the 15 mm trocar site with 1 interrupted 0 Vicryl sutures through the fascia using the endoclose. The closure was viewed laparoscopically and it was airtight. Remaining Exparel was then infiltrated in the preperitoneal spaces around the trocar sites. Pneumoperitoneum was released. All trocar sites were closed with a 4-0 Monocryl in a subcuticular fashion followed by the application of benzoin, steri-strips, and bandaids. The patient was extubated and taken to the recovery room in stable condition. All needle, instrument, and sponge counts were correct x2. There are no immediate complications  (1) 60 mm green with Seamguard (2) 60 mm gold with seamguard (3) 60 mm blue with seamguard  PLAN OF CARE: Admit to inpatient   PATIENT DISPOSITION:  PACU - hemodynamically stable.   Delay start of Pharmacological VTE agent (>24hrs) due to surgical blood loss or risk of bleeding:  no  [name]. [name], MD, FACS FASMBS General, Bariatric, & Minimally Invasive Surgery [facility], [place]

## 2018-01-24 NOTE — Consult Note (Addendum)
[name]  HUD:[id number] DOB: [birth year] DOA: 01/24/2018 PCP: [name], FNP    Reason for Consult / Chief Complaint:  Postop pulmonary assessment, hypoxic resp failure  Consulting MD:  Dr. 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  She follows with Dr. Lake [name] in the clinic. 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 No evidence of vasculitis.  Underwent aortic, mitral valve replacement in 2016.  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  Transferred to SDU on 12 lt O2. PCCM consulted for help with management.   Subjective  Feels well.  Denies dyspnea, cough, sputum production, wheezing.  Assessment & Plan:  Hypoxic respiratory failure Suspect postop atelectasis.  Do not feel this is any worsening of her ILD or asthma Get chest x-ray Incentive spirometer, mobilize OOB when cleared by surgery service Wean down oxygen as tolerated. Consider CPAP at night if her O2 levels drop in sleep.  Prior sleep study October 2018 noted for no sleep apnea but had borderline oxygen saturations.   Interstitial lung disease, chronic hypoxic respiratory failure Continue monitoring Wean oxygen  Moderate persistent asthma Continue Dulera, nebs  Best Practice / Goals of Care / Disposition.   DVT prophylaxis: Lovenox GI prophylaxis: Pepcid Diet: NPO Mobility:bed rest Code Status: Full Family Communication: Pt and mother updated 8/27  Disposition / Summary of Today's Plan 01/24/18     Consultants:   Procedures:   Significant Diagnostic Tests: Sleep study 03/04/17 - No significant obstructive sleep apnea occurred during this study (AHI  = 1.3/h). - No significant central sleep apnea occurred during this study (CAI = 0.0/h). - Mild oxygen desaturation was noted during this study (Min O2 = 87.00%). - The patient snored with soft snoring volume.  Lung biopsy 10/03/2013- hemosiderin laden lung macrophages, mild chronic fibrosis, no vasculitis.  CT high-res 12/12/2013- mild lower lung centrilobular nodularity, air trapping Chest x-ray 01/12/2018- no acute cardiopulmonary abnormality.  I have reviewed the images personally.  Micro Data:   Antimicrobials:  [place] 8/27>   Objective   Gen:      No acute distress HEENT:  EOMI, sclera anicteric Neck:     No masses; no thyromegaly Lungs:    Clear to auscultation bilaterally; normal respiratory effort CV:         Regular rate and rhythm; no murmurs Abd:      + bowel sounds; soft, non-tender; no palpable masses, no distension Ext:    No edema; adequate peripheral perfusion Skin:      Warm and dry; no rash Neuro: alert and oriented x 3 Psych: normal mood and affect  Blood pressure 140/60, pulse (!) 59, temperature 97.8 F (36.6 C), resp. rate 16, height 5\' 5"  (1.651 m), weight (!) 152.4 kg, SpO2 91 %.        Intake/Output Summary (Last 24 hours) at 01/24/2018 1118 Last data filed at 01/24/2018 1100 Gross per 24 hour  Intake 840 ml  Output 50 ml  Net 790 ml   Filed Weights   01/24/18 0600  Weight: (!) 152.4 kg     Labs   CBC: No results for input(s): WBC, NEUTROABS, HGB, HCT, MCV, PLT in the last 168 hours. Basic  Metabolic Panel: No results for input(s): NA, K, CL, CO2, GLUCOSE, BUN, CREATININE, CALCIUM, MG, PHOS in the last 168 hours. GFR: Estimated Creatinine Clearance: 66.8 mL/min (A) (by C-G formula based on SCr of 1.38 mg/dL (H)). No results for input(s): PROCALCITON, WBC, LATICACIDVEN in the last 168 hours. Liver Function Tests: No results for input(s): AST, ALT, ALKPHOS, BILITOT, PROT, ALBUMIN in the last 168 hours. No results for input(s): LIPASE, AMYLASE  in the last 168 hours. No results for input(s): AMMONIA in the last 168 hours. ABG    Component Value Date/Time   PHART 7.287 (L) 10/04/2013 0853   PCO2ART 54.0 (H) 10/04/2013 0853   PO2ART 61.0 (L) 10/04/2013 0853   HCO3 25.9 (H) 10/04/2013 0853   TCO2 28 10/04/2013 0853   ACIDBASEDEF 2.0 10/04/2013 0853   O2SAT 88.0 10/04/2013 0853    Coagulation Profile: Recent Labs  Lab 01/24/18 0607  INR 1.08   Cardiac Enzymes: No results for input(s): CKTOTAL, CKMB, CKMBINDEX, TROPONINI in the last 168 hours. HbA1C: No results found for: HGBA1C CBG: No results for input(s): GLUCAP in the last 168 hours.   Review of Systems:   All negative; except for those that are bolded, which indicate positives.  Constitutional: weight loss, weight gain, night sweats, fevers, chills, fatigue, weakness.  HEENT: headaches, sore throat, sneezing, nasal congestion, post nasal drip, difficulty swallowing, tooth/dental problems, visual complaints, visual changes, ear aches. Neuro: difficulty with speech, weakness, numbness, ataxia. CV:  chest pain, orthopnea, PND, swelling in lower extremities, dizziness, palpitations, syncope.  Resp: cough, hemoptysis, dyspnea, wheezing. GI: heartburn, indigestion, abdominal pain, nausea, vomiting, diarrhea, constipation, change in bowel habits, loss of appetite, hematemesis, melena, hematochezia.  GU: dysuria, change in color of urine, urgency or frequency, flank pain, hematuria. MSK: joint pain or swelling, decreased range of motion. Psych: change in mood or affect, depression, anxiety, suicidal ideations, homicidal ideations. Skin: rash, itching, bruising.  Past medical history   Past Medical History:  Diagnosis Date  . Allergic rhinitis   . Anemia   . Anxiety   . Aortic stenosis   . Arthritis    "lower back" (11/30/2016)  . CAO (chronic airflow obstruction) (HCC)   . Cellulitis of left lower extremity 11/30/2016  . CHF (congestive heart failure) (HCC)    hx  of  . Coronary artery disease   . Depressive disorder   . Dysrhythmia    atrial fib/ flutter  . Gout   . History of blood transfusion 03/2016   "I was anemic"  . History of hiatal hernia   . HTN (hypertension)   . Hyperlipidemia   . Hypertriglyceridemia   . Kidney disease    Dr. Graylon Gunning  . Lymphedema    Right leg  . Menopausal symptoms   . Mitral and aortic heart valve diseases, unspecified 07/2014   s/p AVR with #19 St Jude and s/p MVR with 66mm St. Jude per [name] at [facility]  . Mitral stenosis   . Morbid obesity ([place])   . Noninfectious lymphedema   . On home oxygen therapy    "2-3L when I'm up doing a whole lot" (11/30/2016)  . Renal insufficiency   . Shortness of breath    with exertion  . Sleep apnea    Mar 01 2017 sleep study negative per pt. No mask worn ever  . Undiagnosed cardiac murmurs   . Vitamin D deficiency    Social History   Social History   Socioeconomic History  . Marital status: Single  Spouse name: Not on file  . Number of children: 1  . Years of education: Not on file  . Highest education level: Not on file  Occupational History  . Occupation: disabled  Social Needs  . Financial resource strain: Not hard at all  . Food insecurity:    Worry: Patient refused    Inability: Patient refused  . Transportation needs:    Medical: Patient refused    Non-medical: Patient refused  Tobacco Use  . Smoking status: Former Smoker    Packs/day: 1.00    Years: 35.00    Pack years: 35.00    Types: Cigarettes    Last attempt to quit: 10/03/2013    Years since quitting: 4.3  . Smokeless tobacco: Never Used  Substance and Sexual Activity  . Alcohol use: No  . Drug use: No  . Sexual activity: Not Currently  Lifestyle  . Physical activity:    Days per week: Not on file    Minutes per session: Not on file  . Stress: Not on file  Relationships  . Social connections:    Talks on phone: Not on file    Gets together: Not on file    Attends religious  service: Not on file    Active member of club or organization: Not on file    Attends meetings of clubs or organizations: Not on file    Relationship status: Not on file  . Intimate partner violence:    Fear of current or ex partner: Not on file    Emotionally abused: Not on file    Physically abused: Not on file    Forced sexual activity: Not on file  Other Topics Concern  . Not on file  Social History Narrative   Lives at home with sister.  Pt is singles, disabled,  Has HS diploma.      Family history    Family History  Problem Relation Age of Onset  . Emphysema Mother   . Cancer Mother        throat  . Hypertension Mother   . Dementia Mother   . Heart disease Father        valve replacement  . Kidney disease Father   . Hypertension Father   . Kidney failure Father        dialysis  . Hypertension Sister   . Hypertension Brother   . Diabetes Brother   . Stroke Brother   . Heart attack Neg Hx     Allergies No Known Allergies  Home meds  Prior to Admission medications   Medication Sig Start Date End Date Taking? Authorizing Provider  acetaminophen (TYLENOL) 650 MG CR tablet Take 1,300 mg by mouth 3 (three) times daily.    Yes [provider]  allopurinol (ZYLOPRIM) 300 MG tablet Take 300 mg by mouth at bedtime.    Yes [provider]  aspirin EC 81 MG tablet Take 81 mg by mouth daily.   Yes [provider]  buPROPion (WELLBUTRIN XL) 300 MG 24 hr tablet Take 300 mg by mouth daily.  08/01/12  Yes [provider]  cetirizine (ZYRTEC) 10 MG tablet Take 1 tablet (10 mg total) by mouth daily. Patient taking differently: Take 10 mg by mouth at bedtime.  06/20/15  Yes Magdalen Spatz, NP  Cholecalciferol (VITAMIN D3) 2000 UNITS TABS Take 4,000 Units by mouth daily.    Yes [provider]  citalopram (CELEXA) 20 MG tablet Take 20 mg by mouth daily.  08/01/12  Yes [provider]  enoxaparin (LOVENOX) 150 MG/ML injection Inject 1 mL  (150 mg total) into the skin every 12 (twelve) hours. 01/16/18  Yes Lorretta Harp, MD  furosemide (LASIX) 40 MG tablet TAKE 3 TABLETS BY MOUTH IN THE AM AND 2 TABLETS IN THE PM Patient taking differently: Take 80-120 mg by mouth See admin instructions. Take 120 mg in the morning and 80 mg in the evening 09/13/17  Yes [name], NP  losartan (COZAAR) 50 MG tablet Take 1 tablet (50 mg total) by mouth daily. Patient taking differently: Take 50 mg by mouth at bedtime.  08/18/17  Yes [name], NP  metoprolol (LOPRESSOR) 50 MG tablet Take 50 mg by mouth 2 (two) times daily.   Yes [provider]  montelukast (SINGULAIR) 10 MG tablet Take 10 mg by mouth daily.    Yes [provider]  Multiple Vitamins-Minerals (CENTRUM SILVER 50+WOMEN) TABS Take 1 tablet by mouth daily.    Yes [provider]  OXYGEN Inhale 2.5-3 L into the lungs continuous.    Yes [provider]  potassium chloride SA (KLOR-CON M20) 20 MEQ tablet Take 1 tablet (20 mEq total) by mouth 2 (two) times daily. Take with Furosemide. Patient taking differently: Take 20 mEq by mouth at bedtime. Take with Furosemide 10/03/17  Yes Belva Crome, MD  pravastatin (PRAVACHOL) 80 MG tablet Take 80 mg by mouth daily.  08/01/12  Yes [provider]  SYMBICORT 80-4.5 MCG/ACT inhaler TAKE 2 PUFFS BY MOUTH TWICE A DAY Patient taking differently: Inhale 2 puffs into the lungs 2 (two) times daily.  10/05/17  Yes Juanito Doom, MD  triamcinolone (NASACORT AQ) 55 MCG/ACT AERO nasal inhaler Place 2 sprays into the nose daily. Patient taking differently: Place 2 sprays into the nose daily as needed (for seasonal allergies).  06/20/15  Yes Magdalen Spatz, NP  warfarin (COUMADIN) 7.5 MG tablet TAKE 1 TABLET BY MOUTH DAILY OR AS DIRECTED BY COUMADIN CLINIC Patient taking differently: Take 3.75-7.5 mg by mouth See admin instructions. Take 7.5 mg by mouth once daily at night except on Wed and Fri take 3.75 mg  every night 12/07/17  Yes Lorretta Harp, MD  albuterol (PROVENTIL HFA;VENTOLIN HFA) 108 (90 Base) MCG/ACT inhaler Inhale 2 puffs into the lungs 2 (two) times daily as needed for wheezing or shortness of breath. Reported on 06/20/2015 05/04/17   Juanito Doom, MD  albuterol (PROVENTIL) (2.5 MG/3ML) 0.083% nebulizer solution Take 3 mLs (2.5 mg total) by nebulization every 4 (four) hours as needed for wheezing or shortness of breath. 06/20/17   Juanito Doom, MD     LOS: 0 days   Marshell Garfinkel MD [facility] 01/24/2018, 11:42 AM

## 2018-01-24 NOTE — Interval H&P Note (Signed)
History and Physical Interval Note:  01/24/2018 7:14 AM  [name]  has presented today for surgery, with the diagnosis of MORBID OBESITY  The various methods of treatment have been discussed with the patient and family. After consideration of risks, benefits and other options for treatment, the patient has consented to  Procedure(s): LAPAROSCOPIC GASTRIC SLEEVE RESECTION WITH UPPER ENDO AND ERAS PATHWAY (N/A) as a surgical intervention .  The patient's history has been reviewed, patient examined, no change in status, stable for surgery.  I have reviewed the patient's chart and labs.  Questions were answered to the patient's satisfaction.    rediscussed risk of bleeding/stroke with pt and her sister Has been doing therapeutic lovenox injections - last dose was yesterday  [name]. [name], MD, FACS General, Bariatric, & Minimally Invasive Surgery [facility] [place]  Greer Pickerel

## 2018-01-24 NOTE — Anesthesia Procedure Notes (Addendum)
Procedure Name: Intubation Date/Time: 01/24/2018 7:29 AM Performed by: Dione Booze, CRNA Pre-anesthesia Checklist: Suction available, Patient being monitored, Emergency Drugs available and Patient identified Patient Re-evaluated:Patient Re-evaluated prior to induction Oxygen Delivery Method: Circle system utilized Preoxygenation: Pre-oxygenation with 100% oxygen Induction Type: IV induction Laryngoscope Size: Mac and 4 Grade View: Grade II Tube type: Subglottic suction tube Tube size: 7.5 mm Number of attempts: 1 Airway Equipment and Method: Stylet Placement Confirmation: ETT inserted through vocal cords under direct vision,  positive ETCO2 and breath sounds checked- equal and bilateral Secured at: 22 cm Tube secured with: Tape Dental Injury: Teeth and Oropharynx as per pre-operative assessment  Comments: Dr. Fransisco Beau here for induction. 88 O2 after moving to bed. Pre O2 x 41mn. Rt upper incisor loose(same as preop).

## 2018-01-25 ENCOUNTER — Encounter (HOSPITAL_COMMUNITY): Payer: Self-pay | Admitting: General Surgery

## 2018-01-25 ENCOUNTER — Inpatient Hospital Stay (HOSPITAL_COMMUNITY): Payer: Medicare Other

## 2018-01-25 DIAGNOSIS — J9600 Acute respiratory failure, unspecified whether with hypoxia or hypercapnia: Secondary | ICD-10-CM

## 2018-01-25 DIAGNOSIS — I9581 Postprocedural hypotension: Secondary | ICD-10-CM | POA: Diagnosis not present

## 2018-01-25 LAB — CBC WITH DIFFERENTIAL/PLATELET
BASOS ABS: 0 10*3/uL (ref 0.0–0.1)
Basophils Relative: 0 %
EOS PCT: 0 %
Eosinophils Absolute: 0 10*3/uL (ref 0.0–0.7)
HCT: 36.1 % (ref 36.0–46.0)
Hemoglobin: 11.4 g/dL — ABNORMAL LOW (ref 12.0–15.0)
LYMPHS PCT: 5 %
Lymphs Abs: 0.7 10*3/uL (ref 0.7–4.0)
MCH: 31.8 pg (ref 26.0–34.0)
MCHC: 31.6 g/dL (ref 30.0–36.0)
MCV: 100.6 fL — AB (ref 78.0–100.0)
MONO ABS: 1 10*3/uL (ref 0.1–1.0)
Monocytes Relative: 7 %
Neutro Abs: 12.6 10*3/uL — ABNORMAL HIGH (ref 1.7–7.7)
Neutrophils Relative %: 88 %
PLATELETS: 234 10*3/uL (ref 150–400)
RBC: 3.59 MIL/uL — ABNORMAL LOW (ref 3.87–5.11)
RDW: 14.6 % (ref 11.5–15.5)
WBC: 14.3 10*3/uL — ABNORMAL HIGH (ref 4.0–10.5)

## 2018-01-25 LAB — CBC
HCT: 30.5 % — ABNORMAL LOW (ref 36.0–46.0)
Hemoglobin: 9.6 g/dL — ABNORMAL LOW (ref 12.0–15.0)
MCH: 32.1 pg (ref 26.0–34.0)
MCHC: 31.5 g/dL (ref 30.0–36.0)
MCV: 102 fL — ABNORMAL HIGH (ref 78.0–100.0)
Platelets: 246 10*3/uL (ref 150–400)
RBC: 2.99 MIL/uL — ABNORMAL LOW (ref 3.87–5.11)
RDW: 15.2 % (ref 11.5–15.5)
WBC: 19 10*3/uL — ABNORMAL HIGH (ref 4.0–10.5)

## 2018-01-25 LAB — COMPREHENSIVE METABOLIC PANEL
ALT: 41 U/L (ref 0–44)
AST: 61 U/L — AB (ref 15–41)
Albumin: 3.9 g/dL (ref 3.5–5.0)
Alkaline Phosphatase: 51 U/L (ref 38–126)
Anion gap: 11 (ref 5–15)
BUN: 36 mg/dL — AB (ref 6–20)
CHLORIDE: 99 mmol/L (ref 98–111)
CO2: 30 mmol/L (ref 22–32)
Calcium: 9.1 mg/dL (ref 8.9–10.3)
Creatinine, Ser: 1.48 mg/dL — ABNORMAL HIGH (ref 0.44–1.00)
GFR, EST AFRICAN AMERICAN: 44 mL/min — AB (ref >60)
GFR, EST NON AFRICAN AMERICAN: 38 mL/min — AB (ref >60)
Glucose, Bld: 178 mg/dL — ABNORMAL HIGH (ref 70–99)
POTASSIUM: 5 mmol/L (ref 3.5–5.1)
Sodium: 140 mmol/L (ref 135–145)
Total Bilirubin: 0.6 mg/dL (ref 0.3–1.2)
Total Protein: 7.5 g/dL (ref 6.5–8.1)

## 2018-01-25 LAB — HEMOGLOBIN AND HEMATOCRIT, BLOOD
HEMATOCRIT: 30.7 % — AB (ref 36.0–46.0)
HEMOGLOBIN: 9.7 g/dL — AB (ref 12.0–15.0)

## 2018-01-25 LAB — PROTIME-INR
INR: 1.14
PROTHROMBIN TIME: 14.5 s (ref 11.4–15.2)

## 2018-01-25 LAB — LACTIC ACID, PLASMA: Lactic Acid, Venous: 1.6 mmol/L (ref 0.5–1.9)

## 2018-01-25 LAB — HEPARIN LEVEL (UNFRACTIONATED): HEPARIN UNFRACTIONATED: 0.94 [IU]/mL — AB (ref 0.30–0.70)

## 2018-01-25 MED ORDER — SODIUM CHLORIDE 0.9 % IV BOLUS: 500.0000 mL | Freq: Once | INTRAVENOUS | Status: DC

## 2018-01-25 MED ORDER — ALBUMIN HUMAN 5 % IV SOLN
12.5000 g | Freq: Once | INTRAVENOUS | Status: AC
  Administered 2018-01-25: 12.5 g via INTRAVENOUS
  Filled 2018-01-25: qty 250

## 2018-01-25 MED ORDER — ALBUMIN HUMAN 5 % IV SOLN
25.0000 g | Freq: Once | INTRAVENOUS | Status: DC
  Filled 2018-01-25: qty 500

## 2018-01-25 MED ORDER — HEPARIN (PORCINE) IN NACL 100-0.45 UNIT/ML-% IJ SOLN
1100.0000 [IU]/h | INTRAMUSCULAR | Status: DC
  Filled 2018-01-25: qty 250

## 2018-01-25 MED ORDER — WARFARIN SODIUM 5 MG PO TABS: 7.5000 mg | ORAL_TABLET | Freq: Once | ORAL | Status: DC

## 2018-01-25 MED ORDER — SODIUM CHLORIDE 0.9 % IV BOLUS
500.0000 mL | Freq: Once | INTRAVENOUS | Status: AC
  Administered 2018-01-25: 500 mL via INTRAVENOUS

## 2018-01-25 MED ORDER — MORPHINE SULFATE (PF) 2 MG/ML IV SOLN: 1.0000 mg | INTRAVENOUS | Status: DC | PRN

## 2018-01-25 MED ORDER — ORAL CARE MOUTH RINSE
15.0000 mL | Freq: Two times a day (BID) | OROMUCOSAL | Status: DC
  Administered 2018-01-25 – 2018-01-28 (×6): 15 mL via OROMUCOSAL

## 2018-01-25 MED ORDER — SODIUM CHLORIDE 0.9 % IV SOLN: INTRAVENOUS | Status: DC | PRN

## 2018-01-25 MED ORDER — LACTATED RINGERS IV BOLUS: 1000.0000 mL | Freq: Once | INTRAVENOUS | Status: DC

## 2018-01-25 NOTE — Care Management Note (Signed)
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  She follows with Dr. Lake [name] in the clinic. 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 No evidence of vasculitis.  Underwent aortic, mitral valve replacement in 2016.  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  Transferred to SDU on 12 lt O2. PCCM consulted for help with management.   Subjective  Hypotensive today AM after receiving morphine at 4 AM.  She is received 1 L of fluid.  MAP is still around 55. Remains on HFNC.    Action/Plan: Following for progression and cm needs-  Expected Discharge Date:                  Expected Discharge Plan:  Home/Self Care  In-House Referral:     Discharge planning Services  CM Consult  Post Acute Care Choice:    Choice offered to:     DME Arranged:    DME Agency:     HH Arranged:    HH Agency:     Status of Service:  In process, will continue to follow  If discussed at Long Length of Stay Meetings, dates discussed:    Additional Comments:  Leeroy Cha, RN 01/25/2018, 10:24 AM

## 2018-01-25 NOTE — Progress Notes (Addendum)
CBC Latest Ref Rng & Units 01/25/2018 01/25/2018 01/24/2018  WBC 4.0 - 10.5 K/uL 19.0(H) 14.3(H) -  Hemoglobin 12.0 - 15.0 g/dL 9.6(L) 11.4(L) 14.0  Hematocrit 36.0 - 46.0 % 30.5(L) 36.1 42.7  Platelets 150 - 400 K/uL 246 234 -    Because of hypotension and decreasing Hgb (I don't think this is dilutional), stopping heparin gtt.   Will need to speak with CCM/Cards about transfusion threshold (hgb <10 or <8) given cardiac history.   Has h&h due at 1600 today  Agree with albumin bolus if needed and low dose Neo if needed.   Change status to ICU  [name]. [name], MD, FACS General, Bariatric, & Minimally Invasive Surgery [facility], [place] [phone]

## 2018-01-25 NOTE — Progress Notes (Signed)
Pt. Has strong urge to urinate but cannot void. Bladder scanner shows 128cc. Bariatric nurse called [facility]). She said she would follow up with the surgeon and call me back. Pt. BP still slightly soft at 100/46 at 1700. Will monitor pt. Closely.

## 2018-01-25 NOTE — Progress Notes (Signed)
Holding Aline placement at this time per PCCM.
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
## 2018-01-25 NOTE — Progress Notes (Addendum)
[name]  CWC:[id number] DOB: Mar 23, 1961 DOA: 01/24/2018 PCP: [name], FNP    Reason for Consult / Chief Complaint:  Postop pulmonary assessment, hypoxic resp failure  Consulting MD:  Dr. 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  She follows with Dr. Lake [name] in the clinic. 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 No evidence of vasculitis.  Underwent aortic, mitral valve replacement in 2016.  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  Transferred to SDU on 12 lt O2. PCCM consulted for help with management.   Subjective  Hypotensive today AM after receiving morphine at 4 AM.  She is received 1 L of fluid.  MAP is still around 55. Remains on HFNC.   Assessment & Plan:  Hypotension Secondary to sedation, low hemoglobin from blood loss Follow CBC.  Transfuse for hemoglobin less than 8 IV fluid. Check Lactic acid. Place a line if it is elevated.   Hypoxic respiratory failure Postop atelectasis.  Do not feel this is any worsening of her ILD or asthma Repeat chest x-ray today Incentive spirometer, mobilize OOB when cleared by surgery service Wean down oxygen as tolerated. Consider CPAP at night if her O2 levels drop in sleep.  Prior sleep study October 2018 noted for no sleep apnea but had borderline oxygen saturations.   Interstitial lung disease, chronic hypoxic respiratory failure Continue monitoring Wean oxygen  Moderate persistent asthma.  No evidence of wheezing Continue Dulera, nebs   Mechanical mitral valve, aortic On anticoagulation.  On hold temporarily Follow CBC  Best Practice / Goals of Care / Disposition.    DVT prophylaxis: Lovenox GI prophylaxis: Pepcid Diet: NPO Mobility:bed rest Code Status: Full Family Communication: Pt and mother updated 8/27  Disposition / Summary of Today's Plan 01/25/18    Consultants:  Cardiology  Procedures:   Significant Diagnostic Tests: Sleep study 03/04/17 - No significant obstructive sleep apnea occurred during this study (AHI = 1.3/h). - No significant central sleep apnea occurred during this study (CAI = 0.0/h). - Mild oxygen desaturation was noted during this study (Min O2 = 87.00%). - The patient snored with soft snoring volume.  Lung biopsy 10/03/2013- hemosiderin laden lung macrophages, mild chronic fibrosis, no vasculitis.  CT high-res 12/12/2013- mild lower lung centrilobular nodularity, air trapping Chest x-ray 01/12/2018- no acute cardiopulmonary abnormality. Chest x-ray 01/25/2018- no acute cardiopulmonary abnormality. I have reviewed the images personally.  Micro Data:   Antimicrobials:  Cefotan 8/27>   Objective   Gen:      No acute distress, obese HEENT:  EOMI, sclera anicteric Neck:     No masses; no thyromegaly Lungs:    Clear to auscultation bilaterally; normal respiratory effort CV:         Regular rate and rhythm; no murmurs Abd:      + bowel sounds; soft, non-tender; no palpable masses, no distension Ext:    No edema; adequate peripheral perfusion Skin:      Warm and dry; no rash Neuro: Somnolent, arousable.  Blood pressure (!) 86/32, pulse 66, temperature 97.7 F (36.5 C), temperature source Oral, resp. rate (!) 21, height 5\' 5"  (1.651 m), weight (!) 152.4 kg, SpO2 90 %.  Intake/Output Summary (Last 24 hours) at 01/25/2018 0858 Last data filed at 01/25/2018 0500 Gross per 24 hour  Intake 1430 ml  Output 25 ml  Net 1405 ml   Filed Weights   01/24/18 0600  Weight: (!) 152.4 kg     Labs   CBC: Recent Labs  Lab 01/24/18 1004 01/25/18 0206 01/25/18 0803  WBC  --  14.3* 19.0*  NEUTROABS  --  12.6*  --    HGB 14.0 11.4* 9.6*  HCT 42.7 36.1 30.5*  MCV  --  100.6* 102.0*  PLT  --  234 626   Basic Metabolic Panel: Recent Labs  Lab 01/25/18 0206  NA 140  K 5.0  CL 99  CO2 30  GLUCOSE 178*  BUN 36*  CREATININE 1.48*  CALCIUM 9.1   GFR: Estimated Creatinine Clearance: 62.3 mL/min (A) (by C-G formula based on SCr of 1.48 mg/dL (H)). Recent Labs  Lab 01/25/18 0206 01/25/18 0803  WBC 14.3* 19.0*   Liver Function Tests: Recent Labs  Lab 01/25/18 0206  AST 61*  ALT 41  ALKPHOS 51  BILITOT 0.6  PROT 7.5  ALBUMIN 3.9   No results for input(s): LIPASE, AMYLASE in the last 168 hours. No results for input(s): AMMONIA in the last 168 hours. ABG    Component Value Date/Time   PHART 7.287 (L) 10/04/2013 0853   PCO2ART 54.0 (H) 10/04/2013 0853   PO2ART 61.0 (L) 10/04/2013 0853   HCO3 25.9 (H) 10/04/2013 0853   TCO2 28 10/04/2013 0853   ACIDBASEDEF 2.0 10/04/2013 0853   O2SAT 88.0 10/04/2013 0853    Coagulation Profile: Recent Labs  Lab 01/24/18 0607 01/25/18 0206  INR 1.08 1.14   Cardiac Enzymes: No results for input(s): CKTOTAL, CKMB, CKMBINDEX, TROPONINI in the last 168 hours. HbA1C: No results found for: HGBA1C CBG: No results for input(s): GLUCAP in the last 168 hours.   Review of Systems:   All negative; except for those that are bolded, which indicate positives.  Constitutional: weight loss, weight gain, night sweats, fevers, chills, fatigue, weakness.  HEENT: headaches, sore throat, sneezing, nasal congestion, post nasal drip, difficulty swallowing, tooth/dental problems, visual complaints, visual changes, ear aches. Neuro: difficulty with speech, weakness, numbness, ataxia. CV:  chest pain, orthopnea, PND, swelling in lower extremities, dizziness, palpitations, syncope.  Resp: cough, hemoptysis, dyspnea, wheezing. GI: heartburn, indigestion, abdominal pain, nausea, vomiting, diarrhea, constipation, change in bowel habits, loss of appetite, hematemesis,  melena, hematochezia.  GU: dysuria, change in color of urine, urgency or frequency, flank pain, hematuria. MSK: joint pain or swelling, decreased range of motion. Psych: change in mood or affect, depression, anxiety, suicidal ideations, homicidal ideations. Skin: rash, itching, bruising.     LOS: 1 day   The patient is critically ill with multiple organ system failure and requires high complexity decision making for assessment and support, frequent evaluation and titration of therapies, advanced monitoring, review of radiographic studies and interpretation of complex data.   Critical Care Time devoted to patient care services, exclusive of separately billable procedures, described in this note is 35 minutes.   Marshell Garfinkel MD [facility] 01/25/2018, 9:28 AM

## 2018-01-25 NOTE — Progress Notes (Signed)
Pt. Up to the bedside commode and then walked to the chair. Pt. Is now sitting up in the chair. Will continue to monitor pt.

## 2018-01-25 NOTE — Progress Notes (Signed)
Pt with low bps. Dr. Excell Seltzer made aware. Order given for 500 ml bolus. Placed.  Hale Bogus.

## 2018-01-25 NOTE — Plan of Care (Signed)
Nutrition Education Note  Received consult for diet education per DROP protocol.   Discussed 2 week post op diet with pt. Emphasized that liquids must be non carbonated, non caffeinated, and sugar free. Fluid goals discussed. Pt to follow up with outpatient bariatric RD for further diet progression after 2 weeks. Multivitamins and minerals also reviewed. Teach back method used, pt expressed understanding, expect good compliance.   Diet: First 2 Weeks  You will see the dietitian about two (2) weeks after your surgery. The dietitian will increase the types of foods you can eat if you are handling liquids well:  If you have severe vomiting or nausea and cannot handle clear liquids lasting longer than 1 day, call your surgeon  Protein Shake  Drink at least 2 ounces of shake 5-6 times per day  Each serving of protein shakes (usually 8 - 12 ounces) should have a minimum of:  15 grams of protein  And no more than 5 grams of carbohydrate  Goal for protein each day:  Men = 80 grams per day  Women = 60 grams per day  Protein powder may be added to fluids such as non-fat milk or Lactaid milk or Soy milk (limit to 35 grams added protein powder per serving)   Hydration  Slowly increase the amount of water and other clear liquids as tolerated (See Acceptable Fluids)  Slowly increase the amount of protein shake as tolerated  Sip fluids slowly and throughout the day  May use sugar substitutes in small amounts (no more than 6 - 8 packets per day; i.e. Splenda)   Fluid Goal  The first goal is to drink at least 8 ounces of protein shake/drink per day (or as directed by the nutritionist); some examples of protein shakes are Premier Protein, Syntrax Nectar, Adkins Advantage, EAS Edge HP, and Unjury. See handout from pre-op Bariatric Education Class:  Slowly increase the amount of protein shake you drink as tolerated  You may find it easier to slowly sip shakes throughout the day  It is important to get your  proteins in first  Your fluid goal is to drink 64 - 100 ounces of fluid daily  It may take a few weeks to build up to this  32 oz (or more) should be clear liquids  And  32 oz (or more) should be full liquids (see below for examples)  Liquids should not contain sugar, caffeine, or carbonation   Clear Liquids:  Water or Sugar-free flavored water (i.e. Fruit H2O, Propel)  Decaffeinated coffee or tea (sugar-free)  Crystal Lite, Wyler's Lite, Minute Maid Lite  Sugar-free Jell-O  Bouillon or broth  Sugar-free Popsicle: *Less than 20 calories each; Limit 1 per day   Full Liquids:  Protein Shakes/Drinks + 2 choices per day of other full liquids  Full liquids must be:  No More Than 12 grams of Carbs per serving  No More Than 3 grams of Fat per serving  Strained low-fat cream soup  Non-Fat milk  Fat-free Lactaid Milk  Sugar-free yogurt (Dannon Lite & Fit, Greek yogurt, Oikos Zero)   Eathen Budreau, MS, RD, LDN [facility] Clinical Dietitian Pager: [phone] After Hours Pager: [phone]   

## 2018-01-25 NOTE — Progress Notes (Addendum)
Patient ID: [name], female   DOB: Apr 14, 1961, 57 y.o.   MRN: [id number]   Progress Note: Metabolic and Bariatric Surgery Service   Chief Complaint/Subjective: Started on hep bolus and gtt last pm for h/o mechanical valves Ambulated some last night Did water - nurse reports about total of 400cc in by mouth and a shake this am Pt received morphine 2mg  at around 0300 and nurse noticed since then pt is hypotensive.   Sister at [facility] Pt is a little somnolent but not bad - answers questions appropriately. Laughing at times with staff.  On-call surgeon just ordered a 1L fluid bolus  Objective: Vital signs in last 24 hours: Temp:  [97.8 F (36.6 C)-98.5 F (36.9 C)] 97.9 F (36.6 C) (08/28 0400) Pulse Rate:  [56-84] 63 (08/28 0659) Resp:  [12-20] 17 (08/28 0659) BP: (106-161)/(42-110) 106/84 (08/28 0659) SpO2:  [85 %-98 %] 95 % (08/28 0659) Last BM Date: 01/24/18  Intake/Output from previous day: 08/27 0701 - 08/28 0700 In: 840 [I.V.:840] Out: 50 [Blood:50] Intake/Output this shift: No intake/output data recorded.  Lungs: cta  Cardiovascular: reg rate  Abd: obese, soft, approp TTP, bruising.   Extremities: chronic RLE lymphedema; +SCDs  Neuro: a little somnolent but answers questions appropriately  Lab Results: CBC  Recent Labs    01/24/18 1004 01/25/18 0206  WBC  --  14.3*  HGB 14.0 11.4*  HCT 42.7 36.1  PLT  --  234   BMET Recent Labs    01/25/18 0206  NA 140  K 5.0  CL 99  CO2 30  GLUCOSE 178*  BUN 36*  CREATININE 1.48*  CALCIUM 9.1   PT/INR Recent Labs    01/24/18 0607 01/25/18 0206  LABPROT 14.0 14.5  INR 1.08 1.14   ABG No results for input(s): PHART, HCO3 in the last 72 hours.  Invalid input(s): PCO2, PO2  Studies/Results:  Anti-infectives: Anti-infectives (From admission, onward)   Start     Dose/Rate Route Frequency Ordered Stop   01/24/18 0615  cefoTEtan (CEFOTAN) 2 g in sodium chloride 0.9 % 100 mL IVPB     2 g 200 mL/hr  over 30 Minutes Intravenous On call to O.R. 01/24/18 0606 01/24/18 0753      Medications: Scheduled Meds: . acetaminophen (TYLENOL) oral liquid 160 mg/5 mL  650 mg Oral Q6H  . furosemide  120 mg Oral Daily  . furosemide  80 mg Oral q1800  . gabapentin  200 mg Oral Q12H  . losartan  50 mg Oral QHS  . mouth rinse  15 mL Mouth Rinse BID  . metoprolol tartrate  50 mg Oral BID  . mometasone-formoterol  2 puff Inhalation BID  . protein supplement shake  2 oz Oral Q2H  . Warfarin - Pharmacist Dosing Inpatient   Does not apply q1800   Continuous Infusions: . dextrose 5 % and 0.45 % NaCl with KCl 20 mEq/L 50 mL/hr at 01/24/18 1135  . famotidine (PEPCID) IV Stopped (01/24/18 2342)  . heparin    . lactated ringers    . sodium chloride     PRN Meds:.albuterol, morphine injection, ondansetron (ZOFRAN) IV, oxyCODONE, simethicone  Assessment/Plan: Patient Active Problem List   Diagnosis Date Noted  . Chronic acquired lymphedema - Right lower extremity 01/24/2018  . Hx of mechanical aortic valve replacement 01/24/2018  . Morbid obesity ([place]) 01/24/2018  . BMI 50.0-59.9, adult ([place]) 10/12/2017  . Right-sided Bell's palsy 02/07/2017  . Obstructive sleep apnea 02/03/2017  . Acute diastolic heart  failure ([place]) 01/11/2017  . Cellulitis 11/30/2016  . Chronic respiratory failure ([place]) 08/12/2015  . 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 Dyspnea 07/22/2015  . Allergic rhinitis 05/02/2015  . Exertional dyspnea   . Symptomatic anemia 04/08/2015  . History of mitral valve replacement with mechanical valve 04/08/2015  . CAP (community acquired pneumonia) 04/08/2015  . CKD (chronic kidney disease) stage 3, GFR 30-59 ml/min (HCC) 04/08/2015  . Atrial flutter, unspecified   . ILD (interstitial lung disease) ([place]) 10/12/2013  . Aortic stenosis 10/02/2013  . Essential hypertension 10/02/2013  . Hyperlipidemia 10/02/2013  . Chronic asthmatic bronchitis ([place]) 08/06/2013  . Abnormal CT scan, chest 08/09/2012    s/p Procedure(s): LAPAROSCOPIC GASTRIC SLEEVE RESECTION WITH UPPER ENDO AND HIATAL HERNIA REPAIR 01/24/2018  Principal Problem:   BMI 50.0-59.9, adult ([place]) Active Problems:   Essential hypertension   Hyperlipidemia   ILD (interstitial lung disease) ([place])   History of mitral valve replacement with mechanical valve   CKD (chronic kidney disease) stage 3, GFR 30-59 ml/min (HCC)   Obstructive sleep apnea   Chronic acquired lymphedema - Right lower extremity   Hx of mechanical aortic valve replacement   Morbid obesity (HCC)  Gastric sleeve- cont postop bari diet as tolerated. Appears to be tolerating Hypotension - doubt due to morphine, getting 1L bolus now, check stat cbc now to see if hgb continuing to drop, for now cont hep gtt; since we have to be careful with her volume status will discuss with cards/ccm about Neo and transfusion threshold if needed (Hgb <10 or <8). Maintain IV access and type/cross. Mechanical heart valves- cont hep gtt for now per pharm, if hgb cont to drop - will need to stop H/o hypertension - will need to hold BP meds while hypotensive - will defer to cards about whether or not to give lasix CKD - uop ok. Cr ok - stable for her. Repeat in am VTE prophylaxis - scds, hep gtt ILD - cont home meds, f/u pulm recs.  Pain control - cont scheduled tylenol, gabapentin; decrease morphine dose and frequency. Cont prn oxycodone Ambulate.   Appreciate CCM and cards assist.   Disposition:  LOS: 1 day  The patient does not meet criteria for discharge because She is hypotensive currently and needs additional monitoring; she has mechanical heart valves - and needs her anticoagulation/hgb monitored.   Greer Pickerel, MD [phone] [facility]

## 2018-01-25 NOTE — Progress Notes (Signed)
At the beginning of my shift (2150) pt noted to have steady small amounts of blood drainage from upper mid abdomen and lower mid abdomen port sites. Both sites reinforced with 4 x 4 gauze. Hale Bogus.
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
## 2018-01-25 NOTE — Progress Notes (Signed)
Still no uop. BP stably low.  Place foley 500cc cx bolus idf <200 in bladder

## 2018-01-25 NOTE — Progress Notes (Signed)
Patient up to [facility] with plans to sit in recliner at bedside.  We discussed the importance of sitting up, but also performing exercises while immobile to prevent DVT.  Exercises demonstrated. No reports of pain or nausea with fluid intake.  Take small sips appropriately.  No additional questions.  Strict I&O's and daily weights ordered.  Will continue to monitor.

## 2018-01-25 NOTE — Progress Notes (Addendum)
Progress Note  Patient Name: [name] Date of Encounter: 01/25/2018  Primary Cardiologist: Sinclair Grooms, MD   Subjective   Called to bedside for hypotension with systolic in the 01-75Z. On my interview, she is sitting on the side of the bed without symptoms, only complaint is being tired from recent narcotic. Has received 1 L fluid bolus overnight without significant improvement.  Inpatient Medications    Scheduled Meds: . acetaminophen (TYLENOL) oral liquid 160 mg/5 mL  650 mg Oral Q6H  . furosemide  120 mg Oral Daily  . furosemide  80 mg Oral q1800  . gabapentin  200 mg Oral Q12H  . losartan  50 mg Oral QHS  . mouth rinse  15 mL Mouth Rinse BID  . metoprolol tartrate  50 mg Oral BID  . mometasone-formoterol  2 puff Inhalation BID  . protein supplement shake  2 oz Oral Q2H  . Warfarin - Pharmacist Dosing Inpatient   Does not apply q1800   Continuous Infusions: . albumin human    . dextrose 5 % and 0.45 % NaCl with KCl 20 mEq/L 50 mL/hr at 01/24/18 1135  . famotidine (PEPCID) IV Stopped (01/24/18 2342)  . heparin 1,100 Units/hr (01/25/18 0751)  . sodium chloride     PRN Meds: albuterol, morphine injection, ondansetron (ZOFRAN) IV, oxyCODONE, simethicone   Vital Signs    Vitals:   01/25/18 0700 01/25/18 0715 01/25/18 0730 01/25/18 0745  BP: (!) 76/33 (!) 66/35 (!) 60/33 (!) 86/32  Pulse: 63 (!) 58 (!) 58 66  Resp: 16 13 16  (!) 21  Temp:      TempSrc:      SpO2: 96% 96% 92% 90%  Weight:      Height:        Intake/Output Summary (Last 24 hours) at 01/25/2018 0845 Last data filed at 01/25/2018 0500 Gross per 24 hour  Intake 1430 ml  Output 50 ml  Net 1380 ml   Filed Weights   01/24/18 0600  Weight: (!) 152.4 kg    Telemetry    Sinus with first degree heart block - Personally Reviewed  ECG    No new tracings - Personally Reviewed  Physical Exam   GEN: No acute distress.   Neck: No JVD Cardiac: RRR, no murmurs, rubs, or gallops.    Respiratory: very tight on right side, left side without wheezing, on 11 L high flow GI: Soft, nontender, non-distended  MS: trace edema on left, right with lymphedema Neuro:  Nonfocal  Psych: Normal affect   Labs    Chemistry Recent Labs  Lab 01/25/18 0206  NA 140  K 5.0  CL 99  CO2 30  GLUCOSE 178*  BUN 36*  CREATININE 1.48*  CALCIUM 9.1  PROT 7.5  ALBUMIN 3.9  AST 61*  ALT 41  ALKPHOS 51  BILITOT 0.6  GFRNONAA 38*  GFRAA 44*  ANIONGAP 11     Hematology Recent Labs  Lab 01/24/18 1004 01/25/18 0206 01/25/18 0803  WBC  --  14.3* 19.0*  RBC  --  3.59* 2.99*  HGB 14.0 11.4* 9.6*  HCT 42.7 36.1 30.5*  MCV  --  100.6* 102.0*  MCH  --  31.8 32.1  MCHC  --  31.6 31.5  RDW  --  14.6 15.2  PLT  --  234 246    Cardiac EnzymesNo results for input(s): TROPONINI in the last 168 hours. No results for input(s): TROPIPOC in the last 168 hours.  BNPNo results for input(s): BNP, PROBNP in the last 168 hours.   DDimer No results for input(s): DDIMER in the last 168 hours.   Radiology    Dg Chest Port 1 View  Result Date: 01/24/2018 CLINICAL DATA:  Shortness of breath, acute respiratory failure EXAM: PORTABLE CHEST 1 VIEW COMPARISON:  Chest x-ray of 01/12/2017 FINDINGS: Moderate cardiomegaly is stable. Median sternotomy sutures are again noted without complication. Aortic and mitral valve replacements are present. No acute infiltrate or effusion is seen. IMPRESSION: Stable cardiomegaly.  No active lung disease. Electronically Signed   By: Ivar Drape M.D.   On: 01/24/2018 17:03    Cardiac Studies   Echo 12/03/16 Study Conclusions - Left ventricle: The cavity size was normal. Wall thickness was   increased in a pattern of moderate LVH. Systolic function was   vigorous. The estimated ejection fraction was in the range of 65%   to 70%. - Aortic valve: AV prosthesis is well seated Peak and mean   gradients through the valve atr 47 and 25 mm Hg respectively   These  gradients are lower than those reported in echo of March   2018 - Mitral valve: MV prosthesis is diffiuclt to see well Peak and   mean gradients through the valve are 18 and 8 mm Hg respectively.   MVA by P T1/2 is 1.8 cm2 No significant MR. No significant change   in gradients from echo in March 2018. Valve area by pressure   half-time: 1.8 cm^2. - Pulmonary arteries: PA peak pressure: 43 mm Hg (S).  Patient Profile     57 y.o. female with a hx of evere aortic stenosis & moderate mitral stenosis s/p mechanical MVR/mechanical AVR at [facility] 07/2014 with post-op atrial flutter, no significant CAD prior to that surgery, chronic diastolic CHF, idiopathic pulmonary hemosiderosis, moderate persistent asthma, ILD, polycythemia - requiring phlebotomies, more recent anemia, CKD stage III, HTN, HLD, Bell's palsy, morbid obesity on home O2 with exertion who is being seen today for the management of anticoagulation.  Assessment & Plan    1. Mechanical Mitral Valve/aortic valve, on anticoagulation - had gastric sleeve yesterday - on heparin drip with bridge to coumadin - Hb dropped to 9.6, would follow this serially - If she continues to drop, may need to hold heparin drip - she denies bleeding at this time   2. Hypotension this morning with systolic in the 59D-63O - surgery has ordered a fluid bolus - 1 liter overnight - repeat CBC with Hb 9.3 - in discussion with Dr. Johnsie Cancel, will consult PCCM for help with management of hypotension in this post-op patient with falling Hb   3. Hypoxia - on 11 L high flow - very tight on right side - ordered CXR - pending - asked nursing to call respiratory for nebs   3. Chronic diastolic heart failure, chronic RLE lymphedema - overall net positive - lasix and BP meds held this morning for hypotension   4.  Hx of post-op atrial flutter in 2016 - sinus with first degree heart block on telemetry     For questions or updates, please contact [place] Please consult [email or web address] for contact info under Cardiology/STEMI.      Signed, Tami Lin Duke, PA  01/25/2018, 8:45 AM

## 2018-01-25 NOTE — Progress Notes (Addendum)
Came by room. Pt and sister asleep.  Systolic BPs in low 195K now.  Color better.   Cont bari diet  Pt will need to get OOB to chair and walk some today Repeat h&h at 1600 Transfuse if hgb <8 Dec mIVF to 25cc/hr.  Hold systemic anticoagulation for valves and VTE prophylaxis until we know hgb stable.  Need strict I/o Daily weights  [name]. [name], MD, FACS General, Bariatric, & Minimally Invasive Surgery [facility], [place]

## 2018-01-26 ENCOUNTER — Inpatient Hospital Stay (HOSPITAL_COMMUNITY): Payer: Medicare Other

## 2018-01-26 DIAGNOSIS — I9581 Postprocedural hypotension: Secondary | ICD-10-CM

## 2018-01-26 DIAGNOSIS — J9600 Acute respiratory failure, unspecified whether with hypoxia or hypercapnia: Secondary | ICD-10-CM | POA: Diagnosis not present

## 2018-01-26 DIAGNOSIS — I480 Paroxysmal atrial fibrillation: Secondary | ICD-10-CM | POA: Diagnosis present

## 2018-01-26 DIAGNOSIS — J9601 Acute respiratory failure with hypoxia: Secondary | ICD-10-CM

## 2018-01-26 DIAGNOSIS — N179 Acute kidney failure, unspecified: Secondary | ICD-10-CM | POA: Diagnosis not present

## 2018-01-26 DIAGNOSIS — N183 Chronic kidney disease, stage 3 unspecified: Secondary | ICD-10-CM | POA: Diagnosis not present

## 2018-01-26 DIAGNOSIS — R0902 Hypoxemia: Secondary | ICD-10-CM

## 2018-01-26 LAB — CBC
HCT: 24.8 % — ABNORMAL LOW (ref 36.0–46.0)
Hemoglobin: 8 g/dL — ABNORMAL LOW (ref 12.0–15.0)
MCH: 32.1 pg (ref 26.0–34.0)
MCHC: 32.3 g/dL (ref 30.0–36.0)
MCV: 99.6 fL (ref 78.0–100.0)
PLATELETS: 164 10*3/uL (ref 150–400)
RBC: 2.49 MIL/uL — ABNORMAL LOW (ref 3.87–5.11)
RDW: 16.2 % — AB (ref 11.5–15.5)
WBC: 16 10*3/uL — AB (ref 4.0–10.5)

## 2018-01-26 LAB — CBC WITH DIFFERENTIAL/PLATELET
BASOS PCT: 0 %
Basophils Absolute: 0 10*3/uL (ref 0.0–0.1)
EOS ABS: 0 10*3/uL (ref 0.0–0.7)
Eosinophils Relative: 0 %
HEMATOCRIT: 25.2 % — AB (ref 36.0–46.0)
Hemoglobin: 8 g/dL — ABNORMAL LOW (ref 12.0–15.0)
Lymphocytes Relative: 7 %
Lymphs Abs: 1.3 10*3/uL (ref 0.7–4.0)
MCH: 32.4 pg (ref 26.0–34.0)
MCHC: 31.7 g/dL (ref 30.0–36.0)
MCV: 102 fL — ABNORMAL HIGH (ref 78.0–100.0)
MONOS PCT: 15 %
Monocytes Absolute: 2.8 10*3/uL — ABNORMAL HIGH (ref 0.1–1.0)
Neutro Abs: 13.8 10*3/uL — ABNORMAL HIGH (ref 1.7–7.7)
Neutrophils Relative %: 78 %
Platelets: 183 10*3/uL (ref 150–400)
RBC: 2.47 MIL/uL — ABNORMAL LOW (ref 3.87–5.11)
RDW: 15.7 % — ABNORMAL HIGH (ref 11.5–15.5)
WBC: 17.8 10*3/uL — ABNORMAL HIGH (ref 4.0–10.5)

## 2018-01-26 LAB — COMPREHENSIVE METABOLIC PANEL
ALT: 36 U/L (ref 0–44)
AST: 48 U/L — ABNORMAL HIGH (ref 15–41)
Albumin: 3.9 g/dL (ref 3.5–5.0)
Alkaline Phosphatase: 44 U/L (ref 38–126)
Anion gap: 12 (ref 5–15)
BILIRUBIN TOTAL: 0.7 mg/dL (ref 0.3–1.2)
BUN: 64 mg/dL — ABNORMAL HIGH (ref 6–20)
CHLORIDE: 99 mmol/L (ref 98–111)
CO2: 28 mmol/L (ref 22–32)
CREATININE: 3.8 mg/dL — AB (ref 0.44–1.00)
Calcium: 8.9 mg/dL (ref 8.9–10.3)
GFR calc non Af Amer: 12 mL/min — ABNORMAL LOW (ref >60)
GFR, EST AFRICAN AMERICAN: 14 mL/min — AB (ref >60)
Glucose, Bld: 135 mg/dL — ABNORMAL HIGH (ref 70–99)
Potassium: 5.4 mmol/L — ABNORMAL HIGH (ref 3.5–5.1)
Sodium: 139 mmol/L (ref 135–145)
TOTAL PROTEIN: 6.8 g/dL (ref 6.5–8.1)

## 2018-01-26 LAB — BASIC METABOLIC PANEL
ANION GAP: 13 (ref 5–15)
BUN: 72 mg/dL — ABNORMAL HIGH (ref 6–20)
CALCIUM: 9 mg/dL (ref 8.9–10.3)
CO2: 27 mmol/L (ref 22–32)
Chloride: 96 mmol/L — ABNORMAL LOW (ref 98–111)
Creatinine, Ser: 4.47 mg/dL — ABNORMAL HIGH (ref 0.44–1.00)
GFR calc Af Amer: 12 mL/min — ABNORMAL LOW (ref >60)
GFR, EST NON AFRICAN AMERICAN: 10 mL/min — AB (ref >60)
GLUCOSE: 126 mg/dL — AB (ref 70–99)
Potassium: 5 mmol/L (ref 3.5–5.1)
Sodium: 136 mmol/L (ref 135–145)

## 2018-01-26 LAB — PREPARE RBC (CROSSMATCH)

## 2018-01-26 LAB — PROTIME-INR
INR: 1.43
PROTHROMBIN TIME: 17.4 s — AB (ref 11.4–15.2)

## 2018-01-26 LAB — MRSA PCR SCREENING: MRSA by PCR: NEGATIVE

## 2018-01-26 LAB — GLUCOSE, CAPILLARY: Glucose-Capillary: 122 mg/dL — ABNORMAL HIGH (ref 70–99)

## 2018-01-26 LAB — HEMOGLOBIN AND HEMATOCRIT, BLOOD
HCT: 24 % — ABNORMAL LOW (ref 36.0–46.0)
Hemoglobin: 7.9 g/dL — ABNORMAL LOW (ref 12.0–15.0)

## 2018-01-26 MED ORDER — FUROSEMIDE 10 MG/ML IJ SOLN
120.0000 mg | Freq: Once | INTRAVENOUS | Status: AC
  Administered 2018-01-26: 120 mg via INTRAVENOUS
  Filled 2018-01-26: qty 10

## 2018-01-26 MED ORDER — SODIUM CHLORIDE 0.9 % IV SOLN
INTRAVENOUS | Status: DC
  Administered 2018-01-26 – 2018-01-31 (×3): via INTRAVENOUS

## 2018-01-26 MED ORDER — FUROSEMIDE 10 MG/ML IJ SOLN
120.0000 mg | Freq: Once | INTRAVENOUS | Status: AC
  Administered 2018-01-26: 120 mg via INTRAVENOUS
  Filled 2018-01-26: qty 2

## 2018-01-26 MED ORDER — SODIUM CHLORIDE 0.9% IV SOLUTION
Freq: Once | INTRAVENOUS | Status: AC
  Administered 2018-01-26: 12:00:00 via INTRAVENOUS

## 2018-01-26 MED ORDER — SODIUM CHLORIDE 0.9% IV SOLUTION: Freq: Once | INTRAVENOUS | Status: DC

## 2018-01-26 NOTE — Progress Notes (Signed)
OT Cancellation Note  Patient Details Name: [name] MRN: [id number] DOB: January 06, 1961   Cancelled Treatment:    Reason Eval/Treat Not Completed: Fatigue/lethargy limiting ability to participate.  Pt agreeable to working with OT, but could not maintain arousal.  RN reports she sat in chair for about 6 hours today. Will check back tomorrow am.  Hena Ewalt 01/26/2018, 3:54 PM  Lesle Chris, OTR/L [phone] 01/26/2018

## 2018-01-26 NOTE — Progress Notes (Signed)
ANTICOAGULATION CONSULT NOTE   Pharmacy Consult for Heparin, Warfarin Indication: mechanical MVR, mechanical AVR  No Known Allergies  Patient Measurements: Height: 5\' 5"  (165.1 cm) Weight: (Bed error - unable to weight patient.) IBW/kg (Calculated) : 57 Heparin Dosing Weight: 96 kg  Vital Signs: Temp: 98.4 F (36.9 C) (08/29 1128) Temp Source: Axillary (08/29 1128) BP: 143/107 (08/29 1128) Pulse Rate: 106 (08/29 1128)  Labs: Recent Labs    01/24/18 0607  01/25/18 0206 01/25/18 0803 01/25/18 1611 01/26/18 0303  HGB  --    < > 11.4* 9.6* 9.7* 8.0*  HCT  --    < > 36.1 30.5* 30.7* 25.2*  PLT  --   --  234 246  --  183  APTT 42*  --   --   --   --   --   LABPROT 14.0  --  14.5  --   --  17.4*  INR 1.08  --  1.14  --   --  1.43  HEPARINUNFRC  --   --  0.94*  --   --   --   CREATININE  --   --  1.48*  --   --  3.80*   < > = values in this interval not displayed.    Estimated Creatinine Clearance: 24.3 mL/min (A) (by C-G formula based on SCr of 3.8 mg/dL (H)).    Medications:  Scheduled:  . acetaminophen (TYLENOL) oral liquid 160 mg/5 mL  650 mg Oral Q6H  . gabapentin  200 mg Oral Q12H  . mouth rinse  15 mL Mouth Rinse BID  . mometasone-formoterol  2 puff Inhalation BID  . protein supplement shake  2 oz Oral Q2H  . Warfarin - Pharmacist Dosing Inpatient   Does not apply q1800   Infusions:  . sodium chloride    . sodium chloride 25 mL/hr at 01/26/18 0742  . famotidine (PEPCID) IV Stopped (01/26/18 1008)  . sodium chloride      Assessment: 33 yoF admitted on 8/27 for bariatric surgery, s/p laparoscopic sleeve gastrectomy.  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  Pharmacy was consulted to resume warfarin dosing and begin bridging with Heparin IV (OK to bolus) instead of planned Lovenox due to bleeding risk.   PTA Lovenox 150mg  [place] q12h  (Started 8/19, last dose on 8/26 at 0730)  PTA Warfarin 7.5 mg daily except 3.75 mg on Wed/Fridays (Last dose on 8/21.  INR subtherapeutic at 2.3 (goal INR 2.5-3.5) on 8/19 at coumadin clinic, but previously therapeutic on 7/31 and 7/10.)  Pre-op Lovenox 40mg  [place] given on 8/27 at 06:30.  Significant Events: 8/28  POD#1 RN reports some bleeding from two of the abdominal incisions. Heparin level 0.94 on 1600 units/hr.  Supratherapeutic level likely related to heparin bolus.   Today, 01/26/2018:  SCr increased to 3.8  INR increased to 1.43 despite no warfarin since 8/27  CBC: Hgb decreased further to 8.  Plan for transfusion of 1 unit PRBC.  No major current drug-drug interactions with warfarin; preoperative aprepitant x 1 on 01/25/18 at PONV-prophylactic dosage is noted.    Diet: Bariatric liquid diet; protein shakes with goal of 2 fl oz q2h if tolerated  Warfarin dosing comment:  Published literature describing experiences of patients on chronic warfarin who undergo bariatric surgery demonstrates that on average, many patients require a significant reduction in their weekly warfarin dosage during the early  post-operative period.   Mechanism has not been determined with certainty but could be secondary to post-operative alterations in vitamin K intake and absorption.   The weekly warfarin dosage requirement did typically return to pre-surgery levels at approximately 90-180 days post-operatively.    Goal of Therapy:  INR 2-3 Heparin level 0.3-0.7 units/ml Monitor platelets by anticoagulation protocol: Yes   Plan:   Continue to HOLD Heparin drip  Continue to HOLD warfarin today as per cardiology notes   CBC tonight and with AM labs.  Cardiology/surgery to evaluate for safety of anticoagulation.   Monitor for s/s bleeding.    Gretta Arab PharmD, BCPS Pager [phone] 01/26/2018 11:47 AM

## 2018-01-26 NOTE — Progress Notes (Signed)
Pt. Has been alert and oriented x4 all day until 1530. Pt. Became more confused at 1530. Pt. Is oriented but is more confused asking to answer a ringing phone that was not ringing. Will continue to monitor pt. Closely.

## 2018-01-26 NOTE — Progress Notes (Signed)
Dr Hassell Done will be rounding on the pt on Friday and this weekend along with Dr Marcello Moores from [place].   Plan would be to restart heparin gtt Friday WITHOUT a bolus & coumadin if she responds to transfusion today and maintains her hgb overnight - rediscuss with teams in AM.   Appreciate assist from consultant services  [name]. [name], MD, FACS General, Bariatric, & Minimally Invasive Surgery [facility], [place]

## 2018-01-26 NOTE — Progress Notes (Addendum)
Patient ID: [name], female   DOB: Feb 09, 1961, 57 y.o.   MRN: [id number]   Progress Note: Metabolic and Bariatric Surgery Service   Chief Complaint/Subjective: I feel a lot better. Tolerating liquids. No n/v. Some mild upper abd discomfort.   BP improved throughout day yesterday; hep gtt was stopped for ongoing decrease in hgb hgb down slightly this am  Had very low uop yesterday. Got two 500cc boluses yesterday am for hypotn.  Got albumin and small bolus last night for ongoing oligouria. Foley placed to measure I/o.  Only 350 total uop. Has about 20cc in bag this am  Got to chair yesterday  Objective: Vital signs in last 24 hours: Temp:  [97 F (36.1 C)-98.3 F (36.8 C)] 97 F (36.1 C) (08/29 0416) Pulse Rate:  [58-96] 95 (08/29 0509) Resp:  [11-26] 19 (08/29 0509) BP: (60-155)/(25-133) 111/41 (08/29 0509) SpO2:  [86 %-98 %] 94 % (08/29 0509) Last BM Date: 01/24/18  Intake/Output from previous day: 08/28 0701 - 08/29 0700 In: 670.5 [P.O.:480; I.V.:140.5; IV Piggyback:50] Out: 350 [Urine:350] Intake/Output this shift: No intake/output data recorded.  Lungs: cta, nonlabored  Cardiovascular: rate ok. +click  Abd: obese, soft, bruising, mild TTP, incisions ok  Extremities: chronic RLE lymphedema, +SCDs; LLE - no edema  Neuro: alert, nonfocal  No longer pale  Lab Results: CBC  Recent Labs    01/25/18 0803 01/25/18 1611 01/26/18 0303  WBC 19.0*  --  17.8*  HGB 9.6* 9.7* 8.0*  HCT 30.5* 30.7* 25.2*  PLT 246  --  183   BMET Recent Labs    01/25/18 0206 01/26/18 0303  NA 140 139  K 5.0 5.4*  CL 99 99  CO2 30 28  GLUCOSE 178* 135*  BUN 36* 64*  CREATININE 1.48* 3.80*  CALCIUM 9.1 8.9   PT/INR Recent Labs    01/25/18 0206 01/26/18 0303  LABPROT 14.5 17.4*  INR 1.14 1.43   ABG No results for input(s): PHART, HCO3 in the last 72 hours.  Invalid input(s): PCO2, PO2  Studies/Results:  Anti-infectives: Anti-infectives (From admission,  onward)   Start     Dose/Rate Route Frequency Ordered Stop   01/24/18 0615  cefoTEtan (CEFOTAN) 2 g in sodium chloride 0.9 % 100 mL IVPB     2 g 200 mL/hr over 30 Minutes Intravenous On call to O.R. 01/24/18 0606 01/24/18 0753      Medications: Scheduled Meds: . acetaminophen (TYLENOL) oral liquid 160 mg/5 mL  650 mg Oral Q6H  . gabapentin  200 mg Oral Q12H  . mouth rinse  15 mL Mouth Rinse BID  . mometasone-formoterol  2 puff Inhalation BID  . protein supplement shake  2 oz Oral Q2H  . Warfarin - Pharmacist Dosing Inpatient   Does not apply q1800   Continuous Infusions: . sodium chloride    . sodium chloride    . famotidine (PEPCID) IV Stopped (01/25/18 2355)  . sodium chloride     PRN Meds:.Place/Maintain arterial line **AND** sodium chloride, albuterol, morphine injection, ondansetron (ZOFRAN) IV, oxyCODONE, simethicone  Assessment/Plan: Patient Active Problem List   Diagnosis Date Noted  . Postprocedural hypotension   . Chronic acquired lymphedema - Right lower extremity 01/24/2018  . Hx of mechanical aortic valve replacement 01/24/2018  . Morbid obesity ([place]) 01/24/2018  . BMI 50.0-59.9, adult ([place]) 10/12/2017  . Right-sided Bell's palsy 02/07/2017  . Obstructive sleep apnea 02/03/2017  . Acute diastolic heart failure ([place]) 01/11/2017  . Cellulitis 11/30/2016  . Chronic respiratory  failure ([place]) 08/12/2015  . 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 Dyspnea 07/22/2015  . Allergic rhinitis 05/02/2015  . Exertional dyspnea   . Symptomatic anemia 04/08/2015  . History of mitral valve replacement with mechanical valve 04/08/2015  . CAP (community acquired pneumonia) 04/08/2015  . CKD (chronic kidney disease) stage 3, GFR 30-59 ml/min (HCC) 04/08/2015  . Atrial flutter, unspecified   . ILD (interstitial lung disease) ([place]) 10/12/2013  . Aortic stenosis 10/02/2013  . Essential hypertension 10/02/2013  . Hyperlipidemia 10/02/2013  . Chronic asthmatic bronchitis ([place]) 08/06/2013   . Abnormal CT scan, chest 08/09/2012   s/p Procedure(s): LAPAROSCOPIC GASTRIC SLEEVE RESECTION WITH UPPER ENDO AND HIATAL HERNIA REPAIR 01/24/2018  Gastric sleeve- cont postop bari diet as tolerated. Appears to be tolerating Hypotension - resolved.  Mechanical heart valves- cont to hold hep gtt for now given ongoing decrease in hgb. Plan would be to restart heparin gtt Friday WITHOUT a bolus if she responds to transfusion today and maintains her hgb overnight - rediscuss with teams in AM.  H/o hypertension - will need to hold BP meds while hypotensive - will defer to cards about whether or not to give lasix AKI on CKD - uop low, Cr up- not surprised given hypoTN yesterday. Consult renal.  VTE prophylaxis - scds, hold hep gtt for now ILD - cont home meds, f/u pulm recs.  Pain control - cont scheduled tylenol, gabapentin; decrease morphine dose and frequency. Cont prn oxycodone ABL anemia -  Hgb now 8.  Will defer to cards about whether or not to go ahead and transfuse today.  Maintain IV.  If don't transfuse, would recheck hgb later today FEN- change mivf to NS at 25cc/hr. Hyperkalemia - remove potassium from fluids. Cont bari diet.   ID - still with elevated wbc but no fever and o/w looks good. Suspicion for leak very very low.  Ambulate.   I'M OUT OF TOWN STARTING TOMORROW.  ONE OF MY PARTNERS, DR Hassell Done,  WILL ROUND ON PT. DISCUSSED WITH PT.  Disposition:  LOS: 2 days  The patient does not meet criteria for discharge because She has aki on ckd.  Greer Pickerel, MD [phone] [facility]

## 2018-01-26 NOTE — Progress Notes (Signed)
[name]  EHM:[id number] DOB: September 23, 1960 DOA: 01/24/2018 PCP: [name], FNP    Reason for Consult / Chief Complaint:  Postop pulmonary assessment, hypoxic resp failure  Consulting MD:  Dr. 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  She follows with Dr. Lake [name] in the clinic. 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 No evidence of vasculitis.  Underwent aortic, mitral valve replacement in 2016 on anticoag.  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  Transferred to SDU on 12 lt O2. PCCM consulted for help with management. Experienced some hypotension in setting acute blood loss anemia 8/29, acute on chronic renal failure  Subjective / Interval Events  Drop in hemoglobin from 9.7-8.0, heparin drip held Decreased urine output in the setting some hypotension, received IV fluid bolus overnight, blood pressure medications held Current oxygen need 8 L per nasal cannula, SPO2 97%  Assessment & Plan:  Hypotension, improved.  Lactate 1.6 8/28 Multifactorial, blood loss anemia, sedating medications postop, volume shifts Volume resuscitation Agree with following hemoglobin, transfuse for less than 8.0 if suspicious for active blood loss; anticoagulation held Home metoprolol, losartan, Lasix on hold  Acute anemia, suspect volume shifts and acute post-op losses Follow CBC Pepcid as ordered.   Acute on chronic (stage 3) renal failure  Agree with gentle volume resuscitation Lasix on hold Nephrology consulted by surgery, await their input  Hyperkalemia Following BMP  Hypoxic respiratory failure, atelectasis.  No clear evidence of flaring  of her chronic lung issues Improving oxygen need.  Continued atelectasis noted on chest x-ray from 8/28. Continue to push pulmonary hygiene, up to chair, mobilization, incentive spirometer Continue to wean oxygen to her baseline 3 L/min Could still consider empiric CPAP if she desaturates while sleeping although should not need chronically based on her PSG 02/2017   Interstitial lung disease, chronic hypoxic respiratory failure Weaning oxygen as above No evidence of acute flare  Moderate persistent asthma.  No evidence of wheezing Dulera Albuterol as needed Restart Zyrtec, Singulair when able to take p.o.  Mechanical mitral valve, aortic Anticoagulation on hold postop and given acute drop in hemoglobin. Following CBC  Best Practice / Goals of Care / Disposition.   DVT prophylaxis: SCD GI prophylaxis: Pepcid Diet: NPO Mobility: OOB assist Code Status: Full Family Communication: Pt updated 8/29  Disposition / Summary of Today's Plan 01/26/18    Consultants:  Cardiology Nephrology   Procedures:   Significant Diagnostic Tests: Sleep study 03/04/17 - No significant obstructive sleep apnea occurred during this study (AHI = 1.3/h). - No significant central sleep apnea occurred during this study (CAI = 0.0/h). - Mild oxygen desaturation was noted during this study (Min O2 = 87.00%). - The patient snored with soft snoring volume.  Lung biopsy 10/03/2013- hemosiderin laden lung macrophages, mild chronic fibrosis, no vasculitis.  CT high-res 12/12/2013- mild lower lung centrilobular nodularity, air trapping Chest x-ray 01/12/2018- no acute cardiopulmonary abnormality. Chest x-ray 01/25/2018- no acute cardiopulmonary abnormality.   Micro Data:   Antimicrobials:  Cefotan 8/27> 8/28   Objective   Gen:      Pleasant obese woman, up to chair HEENT: Oropharynx clear, poor dentition Neck:     No mass,  no stridor Lungs:   Normal effort, small breaths but clear bilaterally CV:        Regular, no murmur Abd:   Nondistended, nontender, positive bowel sounds Ext: No significant edema Skin:      Warm, no rash Neuro: Wide awake and interacting appropriately, nonfocal  Blood pressure (!) 105/41, pulse 96, temperature 98.7 F (37.1 C), temperature source Oral, resp. rate 19, height 5\' 5"  (1.651 m), weight (!) 152.4 kg, SpO2 97 %.        Intake/Output Summary (Last 24 hours) at 01/26/2018 0945 Last data filed at 01/26/2018 0743 Gross per 24 hour  Intake 910.49 ml  Output 350 ml  Net 560.49 ml   Filed Weights   01/24/18 0600  Weight: (!) 152.4 kg     Labs   CBC: Recent Labs  Lab 01/24/18 1004 01/25/18 0206 01/25/18 0803 01/25/18 1611 01/26/18 0303  WBC  --  14.3* 19.0*  --  17.8*  NEUTROABS  --  12.6*  --   --  13.8*  HGB 14.0 11.4* 9.6* 9.7* 8.0*  HCT 42.7 36.1 30.5* 30.7* 25.2*  MCV  --  100.6* 102.0*  --  102.0*  PLT  --  234 246  --  962   Basic Metabolic Panel: Recent Labs  Lab 01/25/18 0206 01/26/18 0303  NA 140 139  K 5.0 5.4*  CL 99 99  CO2 30 28  GLUCOSE 178* 135*  BUN 36* 64*  CREATININE 1.48* 3.80*  CALCIUM 9.1 8.9   GFR: Estimated Creatinine Clearance: 24.3 mL/min (A) (by C-G formula based on SCr of 3.8 mg/dL (H)). Recent Labs  Lab 01/25/18 0206 01/25/18 0803 01/25/18 1011 01/26/18 0303  WBC 14.3* 19.0*  --  17.8*  LATICACIDVEN  --   --  1.6  --    Liver Function Tests: Recent Labs  Lab 01/25/18 0206 01/26/18 0303  AST 61* 48*  ALT 41 36  ALKPHOS 51 44  BILITOT 0.6 0.7  PROT 7.5 6.8  ALBUMIN 3.9 3.9   No results for input(s): LIPASE, AMYLASE in the last 168 hours. No results for input(s): AMMONIA in the last 168 hours. ABG    Component Value Date/Time   PHART 7.287 (L) 10/04/2013 0853   PCO2ART 54.0 (H) 10/04/2013 0853   PO2ART 61.0 (L) 10/04/2013 0853   HCO3 25.9 (H) 10/04/2013 0853   TCO2 28 10/04/2013 0853   ACIDBASEDEF 2.0 10/04/2013 0853   O2SAT 88.0 10/04/2013 0853    Coagulation Profile: Recent  Labs  Lab 01/24/18 0607 01/25/18 0206 01/26/18 0303  INR 1.08 1.14 1.43   Cardiac Enzymes: No results for input(s): CKTOTAL, CKMB, CKMBINDEX, TROPONINI in the last 168 hours. HbA1C: No results found for: HGBA1C CBG: No results for input(s): GLUCAP in the last 168 hours.   Review of Systems:   All negative; except for those that are bolded, which indicate positives.  Constitutional: weight loss, weight gain, night sweats, fevers, chills, fatigue, weakness.  HEENT: headaches, sore throat, sneezing, nasal congestion, post nasal drip, difficulty swallowing, tooth/dental problems, visual complaints, visual changes, ear aches. Neuro: difficulty with speech, weakness, numbness, ataxia. CV:  chest pain, orthopnea, PND, swelling in lower extremities, dizziness, palpitations, syncope.  Resp: cough, hemoptysis, dyspnea, wheezing. GI: heartburn, indigestion, abdominal pain, nausea, vomiting, diarrhea, constipation, change in bowel habits, loss of appetite, hematemesis, melena, hematochezia.  GU: dysuria, change in color of urine, urgency or frequency, flank pain, hematuria. MSK: joint pain or swelling, decreased range of motion. Psych: change in mood  or affect, depression, anxiety, suicidal ideations, homicidal ideations. Skin: rash, itching, bruising.     LOS: 2 days   The patient is critically ill with multiple organ system failure and requires high complexity decision making for assessment and support, frequent evaluation and titration of therapies, advanced monitoring, review of radiographic studies and interpretation of complex data.   Independent CC time 34 minutes  Baltazar Apo, MD, PhD 01/26/2018, 10:17 AM  Pulmonary and Critical Care [phone] or if no answer [phone]

## 2018-01-26 NOTE — Care Management Note (Signed)
Case Management Note  Patient Details  Name: [name] MRN: [id number] Date of Birth: 03/25/1961  Subjective/Objective:                  Reason for Consult / Chief Complaint:  Postop pulmonary assessment, hypoxic resp failure  Consulting MD:  Dr. 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  She follows with Dr. Lake [name] in the clinic. 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 No evidence of vasculitis.  Underwent aortic, mitral valve replacement in 2016 on anticoag.  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  Transferred to SDU on 12 lt O2. PCCM consulted for help with management. Experienced some hypotension in setting acute blood loss anemia 8/29, acute on chronic renal failure  Subjective / Interval Events  Drop in hemoglobin from 9.7-8.0, heparin drip held Decreased urine output in the setting some hypotension, received IV fluid bolus overnight, blood pressure medications held Current oxygen need 8 L per nasal cannula, SPO2 97%   Action/Plan: Following progression as above Following for cm needs-none present at this time/poss. hhc due to resp. State.  Expected Discharge Date:                  Expected Discharge Plan:  Home/Self Care  In-House Referral:     Discharge planning Services  CM Consult  Post Acute Care Choice:    Choice offered to:     DME Arranged:    DME Agency:     HH Arranged:    HH Agency:     Status of Service:  In process, will continue to follow  If discussed at Long Length of Stay Meetings, dates discussed:    Additional Comments:  Leeroy Cha, RN 01/26/2018, 10:58 AM

## 2018-01-26 NOTE — Progress Notes (Signed)
Patient alert and oriented, sitting in chair. Sister at bedside.  Provided support and encouragement.  Encouraged pulmonary toilet, ambulation and small sips of liquids.  Foley cath in place.  Plan in place with patient and sister to discuss discharge education Friday morning.  All questions answered.  Will continue to monitor.
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
## 2018-01-26 NOTE — Progress Notes (Addendum)
Progress Note  Patient Name: [name] Date of Encounter: 01/26/2018  Primary Cardiologist: Sinclair Grooms, MD   Subjective   Up in chair, feeling much better today. No further dizziness. O2 has decreased to 8L.   Inpatient Medications    Scheduled Meds: . acetaminophen (TYLENOL) oral liquid 160 mg/5 mL  650 mg Oral Q6H  . gabapentin  200 mg Oral Q12H  . mouth rinse  15 mL Mouth Rinse BID  . mometasone-formoterol  2 puff Inhalation BID  . protein supplement shake  2 oz Oral Q2H  . Warfarin - Pharmacist Dosing Inpatient   Does not apply q1800   Continuous Infusions: . sodium chloride    . sodium chloride 25 mL/hr at 01/26/18 0742  . famotidine (PEPCID) IV 20 mg (01/26/18 0934)  . sodium chloride     PRN Meds: Place/Maintain arterial line **AND** sodium chloride, albuterol, morphine injection, ondansetron (ZOFRAN) IV, oxyCODONE, simethicone   Vital Signs    Vitals:   01/26/18 0800 01/26/18 0900 01/26/18 0905 01/26/18 0937  BP: (!) 89/69 (!) 105/41    Pulse: 92 94  96  Resp: 20 18  19   Temp: 98.7 F (37.1 C)     TempSrc: Oral     SpO2: 97% 97% 92% 97%  Weight:      Height:        Intake/Output Summary (Last 24 hours) at 01/26/2018 1003 Last data filed at 01/26/2018 0743 Gross per 24 hour  Intake 910.49 ml  Output 350 ml  Net 560.49 ml   Filed Weights   01/24/18 0600  Weight: (!) 152.4 kg    Telemetry    Sinus, first degree heart block - Personally Reviewed  ECG    No new tracings - Personally Reviewed  Physical Exam   GEN: No acute distress.   Neck: No JVD Cardiac: RRR, valve click Respiratory: Clear to auscultation bilaterally. GI: Soft, nontender, non-distended  MS: right LE lymphedema, left erythema, chronic skin changes, minimal edema Neuro:  Nonfocal  Psych: Normal affect   Labs    Chemistry Recent Labs  Lab 01/25/18 0206 01/26/18 0303  NA 140 139  K 5.0 5.4*  CL 99 99  CO2 30 28  GLUCOSE 178* 135*  BUN 36* 64*    CREATININE 1.48* 3.80*  CALCIUM 9.1 8.9  PROT 7.5 6.8  ALBUMIN 3.9 3.9  AST 61* 48*  ALT 41 36  ALKPHOS 51 44  BILITOT 0.6 0.7  GFRNONAA 38* 12*  GFRAA 44* 14*  ANIONGAP 11 12     Hematology Recent Labs  Lab 01/25/18 0206 01/25/18 0803 01/25/18 1611 01/26/18 0303  WBC 14.3* 19.0*  --  17.8*  RBC 3.59* 2.99*  --  2.47*  HGB 11.4* 9.6* 9.7* 8.0*  HCT 36.1 30.5* 30.7* 25.2*  MCV 100.6* 102.0*  --  102.0*  MCH 31.8 32.1  --  32.4  MCHC 31.6 31.5  --  31.7  RDW 14.6 15.2  --  15.7*  PLT 234 246  --  183    Cardiac EnzymesNo results for input(s): TROPONINI in the last 168 hours. No results for input(s): TROPIPOC in the last 168 hours.   BNPNo results for input(s): BNP, PROBNP in the last 168 hours.   DDimer No results for input(s): DDIMER in the last 168 hours.   Radiology    Dg Chest Port 1 View  Result Date: 01/25/2018 CLINICAL DATA:  Hypoxia.  Hernia repair. EXAM: PORTABLE CHEST 1 VIEW COMPARISON:  The one-view chest x-ray 01/24/2018 FINDINGS: Heart is enlarged. Lung volumes are low. Increased pulmonary vascular congestion is present. Bibasilar airspace disease likely reflects atelectasis. No other significant consolidation is present. The visualized soft tissues and bony thorax are unremarkable. IMPRESSION: 1. Cardiopulmonary with increased pulmonary vascular congestion compatible with early congestive heart failure. 2. Low lung volumes with increased bibasilar airspace disease, likely atelectasis. Electronically Signed   By: San Morelle M.D.   On: 01/25/2018 10:00   Dg Chest Port 1 View  Result Date: 01/24/2018 CLINICAL DATA:  Shortness of breath, acute respiratory failure EXAM: PORTABLE CHEST 1 VIEW COMPARISON:  Chest x-ray of 01/12/2017 FINDINGS: Moderate cardiomegaly is stable. Median sternotomy sutures are again noted without complication. Aortic and mitral valve replacements are present. No acute infiltrate or effusion is seen. IMPRESSION: Stable  cardiomegaly.  No active lung disease. Electronically Signed   By: Ivar Drape M.D.   On: 01/24/2018 17:03    Cardiac Studies   Echo 12/03/16 Study Conclusions - Left ventricle: The cavity size was normal. Wall thickness was increased in a pattern of moderate LVH. Systolic function was vigorous. The estimated ejection fraction was in the range of 65% to 70%. - Aortic valve: AV prosthesis is well seated Peak and mean gradients through the valve atr 47 and 25 mm Hg respectively These gradients are lower than those reported in echo of March 2018 - Mitral valve: MV prosthesis is diffiuclt to see well Peak and mean gradients through the valve are 18 and 8 mm Hg respectively. MVA by P T1/2 is 1.8 cm2 No significant MR. No significant change in gradients from echo in March 2018. Valve area by pressure half-time: 1.8 cm^2. - Pulmonary arteries: PA peak pressure: 43 mm Hg (S).   Patient Profile     57 y.o. female with a hx of evere aortic stenosis & moderate mitral stenosis s/p mechanical MVR/mechanical AVR at [facility] 07/2014 with post-op atrial flutter, no significant CAD prior to that surgery, chronic diastolic CHF, idiopathic pulmonary hemosiderosis,moderate persistent asthma,ILD, polycythemia - requiring phlebotomies, more recent anemia,CKD stage III, HTN, HLD, Bell's palsy, morbid obesity on home O2 with exertionwho is being seen today for the management of anticoagulation.  Assessment & Plan    1. Mechanical mitral valve, aortic valve, on anticoagulation - s/p gastric sleeve - was on heparin drip with bridge to coumadin - INR 1.43 today - Hb dropped to 8.0, heparin turned off, coumadin not started - would transfuse for Hb less than 8.0 - Hb may be dilutional given her fluid boluses yesterday - would recheck this afternoon, restart heparin if stable   2. Hypotension - consulted PCCM yesterday for help with managing her hypotension in the post-op period -  pressures improved, still marginal at times - defer to PCCM - holding lasix, lopressor, and losartan - if pressure remains stable, can consider starting low dose lasix tomorrow   3. Chronic diastolic heart failure - overall net positive secondary to fluid for hypotension - holding lasix as above for hypotension - resume lasix first when able to tolerate   4.  Hx of post- op Atrial flutter 2016 - telemetry with sinus first degree heart block       For questions or updates, please contact [facility] Please consult [email or web address] for contact info under Cardiology/STEMI.      Signed, Mead, PA  01/26/2018, 10:03 AM    Patient more alert sitting in chair S1 click S2 click crisp SEM Hb only 8 and  needs anticoagulation for PAF and two mechanical valves. Transfuse a unit PRBC;s today will also help support BP Start heparin and coumadin in am if HB goes up as expected and stays stable Discussed with patient , nurse and Salvadore Dom CCM  Jenkins Rouge

## 2018-01-27 DIAGNOSIS — N170 Acute kidney failure with tubular necrosis: Secondary | ICD-10-CM

## 2018-01-27 DIAGNOSIS — N183 Chronic kidney disease, stage 3 (moderate): Secondary | ICD-10-CM

## 2018-01-27 LAB — BPAM RBC
BLOOD PRODUCT EXPIRATION DATE: 201909272359
Blood Product Expiration Date: 201909272359
ISSUE DATE / TIME: 201908291123
ISSUE DATE / TIME: 201908291958
UNIT TYPE AND RH: 5100
UNIT TYPE AND RH: 5100

## 2018-01-27 LAB — TYPE AND SCREEN
ABO/RH(D): O POS
ANTIBODY SCREEN: NEGATIVE
UNIT DIVISION: 0
Unit division: 0

## 2018-01-27 LAB — RENAL FUNCTION PANEL
ALBUMIN: 3.6 g/dL (ref 3.5–5.0)
Anion gap: 14 (ref 5–15)
BUN: 73 mg/dL — AB (ref 6–20)
CHLORIDE: 95 mmol/L — AB (ref 98–111)
CO2: 26 mmol/L (ref 22–32)
CREATININE: 4.22 mg/dL — AB (ref 0.44–1.00)
Calcium: 8.9 mg/dL (ref 8.9–10.3)
GFR calc Af Amer: 12 mL/min — ABNORMAL LOW (ref >60)
GFR calc non Af Amer: 11 mL/min — ABNORMAL LOW (ref >60)
GLUCOSE: 118 mg/dL — AB (ref 70–99)
POTASSIUM: 5.1 mmol/L (ref 3.5–5.1)
Phosphorus: 6.2 mg/dL — ABNORMAL HIGH (ref 2.5–4.6)
Sodium: 135 mmol/L (ref 135–145)

## 2018-01-27 LAB — CBC
HCT: 26.2 % — ABNORMAL LOW (ref 36.0–46.0)
HEMOGLOBIN: 8.5 g/dL — AB (ref 12.0–15.0)
MCH: 31.7 pg (ref 26.0–34.0)
MCHC: 32.4 g/dL (ref 30.0–36.0)
MCV: 97.8 fL (ref 78.0–100.0)
PLATELETS: 168 10*3/uL (ref 150–400)
RBC: 2.68 MIL/uL — AB (ref 3.87–5.11)
RDW: 16.7 % — ABNORMAL HIGH (ref 11.5–15.5)
WBC: 16.1 10*3/uL — AB (ref 4.0–10.5)

## 2018-01-27 LAB — HEMOGLOBIN AND HEMATOCRIT, BLOOD
HCT: 25.8 % — ABNORMAL LOW (ref 36.0–46.0)
Hemoglobin: 8.4 g/dL — ABNORMAL LOW (ref 12.0–15.0)

## 2018-01-27 LAB — PROTIME-INR
INR: 1.41
PROTHROMBIN TIME: 17.1 s — AB (ref 11.4–15.2)

## 2018-01-27 LAB — HEPARIN LEVEL (UNFRACTIONATED): HEPARIN UNFRACTIONATED: 0.15 [IU]/mL — AB (ref 0.30–0.70)

## 2018-01-27 MED ORDER — FAMOTIDINE IN NACL 20-0.9 MG/50ML-% IV SOLN
20.0000 mg | INTRAVENOUS | Status: DC
  Administered 2018-01-28 – 2018-01-31 (×4): 20 mg via INTRAVENOUS
  Filled 2018-01-27 (×4): qty 50

## 2018-01-27 MED ORDER — LORATADINE 10 MG PO TABS
10.0000 mg | ORAL_TABLET | Freq: Every day | ORAL | Status: DC
  Administered 2018-01-27 – 2018-01-30 (×4): 10 mg via ORAL
  Filled 2018-01-27 (×4): qty 1

## 2018-01-27 MED ORDER — MONTELUKAST SODIUM 10 MG PO TABS
10.0000 mg | ORAL_TABLET | Freq: Every day | ORAL | Status: DC
  Administered 2018-01-27 – 2018-01-30 (×4): 10 mg via ORAL
  Filled 2018-01-27 (×4): qty 1

## 2018-01-27 MED ORDER — WARFARIN SODIUM 5 MG PO TABS
5.0000 mg | ORAL_TABLET | Freq: Once | ORAL | Status: AC
  Administered 2018-01-27: 5 mg via ORAL
  Filled 2018-01-27: qty 1

## 2018-01-27 MED ORDER — HEPARIN (PORCINE) IN NACL 100-0.45 UNIT/ML-% IJ SOLN
1900.0000 [IU]/h | INTRAMUSCULAR | Status: DC
  Administered 2018-01-27: 1800 [IU]/h via INTRAVENOUS
  Administered 2018-01-27: 1550 [IU]/h via INTRAVENOUS
  Administered 2018-01-28 – 2018-01-31 (×6): 1900 [IU]/h via INTRAVENOUS
  Filled 2018-01-27 (×7): qty 250

## 2018-01-27 NOTE — Care Management Note (Signed)
Case Management Note  Patient Details  Name: [name] MRN: [id number] Date of Birth: 08/30/1960  Subjective/Objective:                  Reason for Consult / Chief Complaint:  Postop pulmonary assessment, hypoxic resp failure  Consulting MD:  Dr. 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  She follows with Dr. Lake [name] in the clinic. 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 No evidence of vasculitis.  Underwent aortic, mitral valve replacement in 2016 on anticoag.  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  Transferred to SDU on 12 lt O2. PCCM consulted for help with management. Experienced some hypotension in setting acute blood loss anemia 8/29, acute on chronic renal failure.  Some improvement in oxygen need, renal function 8/30.  Tolerating a clear diet and planning to reinitiate anticoagulation.  Subjective / Interval Events  Feels well, a bit stronger.  Tolerating clear diet Received 2 units blood 8/29 Seen by nephrology and single dose Lasix 120 mg given with adequate diuresis (I = O, 24h) O2 weaned to 3.5 L/min   Drop in hemoglobin from 9.7-8.0, heparin drip held Decreased urine output in the setting some hypotension, received IV fluid bolus overnight, blood pressure medications held Current oxygen need 8 L per nasal cannula, SPO2 97%  Assessment & Plan:  Hypotension, improved.  Lactate 1.6 8/28 History hypertension, mitral valve replacement Multifactorial, blood loss anemia, sedating medications postop, volume shifts-improved Should be able to back off on volume resuscitation, some evidence for pulmonary edema noted on exam,  chest x-ray Follow hemoglobin, no indication for transfusion at this time Heparin to be restarted 8/30 without a bolus, Coumadin dose per pharmacy Goal restart metoprolol 12.5 mg twice daily on 8/31.  Losartan on hold.  Diuretics being dosed daily with assistance of nephrology  Acute anemia, suspect volume shifts and acute post-op losses Follow CBC, especially with reinitiation anticoagulation Pepcid as ordered   Acute on chronic (stage 3) renal failure, ATN in setting of anemia and hypotension. S Cr appears to have plateaued 8/29 Appreciate nephrology assistance, dosing Lasix daily based on overall volume status and renal recovery  Hyperkalemia Following BMP  Hypoxic respiratory failure, atelectasis, mild acute diastolic CHF.  No clear evidence of flaring of her chronic lung issues Continue to wean oxygen as able, home 3 L/min Up out of bed, push pulmonary hygiene, IS   Interstitial lung disease, chronic hypoxic respiratory failure No evidence of acute flare Continue to wean oxygen  Moderate persistent asthma.  No evidence of wheezing Continue Dulera scheduled, albuterol as needed We will restart Zyrtec and Singulair on 8/30  Mechanical mitral valve, aortic Restarting heparin without a bolus, Coumadin Following CBC   Action/Plan:  Continued inpatient stay is needed until 1 or more of the following are present: ? Acceptable patient status for next level of care is achieved. ? ALL of the following are present:   Respiratory status acceptable=remains on o2 and weaning at 3l/min     Stable chest findings Continue to wean oxygen as able, home 3 L/min Up out of bed, push pulmonary hygiene, IS    Pain and nausea absent or adequately managed  Isolation not needed, or status acceptable    No chest tube, or status acceptable    Activity level acceptable    Intake acceptable    No inpatient interventions needed      Expected Discharge Date:                   Expected Discharge Plan:  Home/Self Care  In-House Referral:     Discharge planning Services  CM Consult  Post Acute Care Choice:    Choice offered to:     DME Arranged:    DME Agency:     HH Arranged:    HH Agency:     Status of Service:  In process, will continue to follow  If discussed at Long Length of Stay Meetings, dates discussed:    Additional Comments:  Leeroy Cha, RN 01/27/2018, 10:12 AM

## 2018-01-27 NOTE — Evaluation (Signed)
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 At baseline, she is independent with all adls.  Will follow in acute setting with supervision to min guard level goals. Will continue to assess for DME needs  Pt's sister will assist with adls as needed     Follow Up Recommendations  Supervision/Assistance - 24 hour    Equipment Recommendations  (to be further assessed)    Recommendations for Other Services       Precautions / Restrictions Precautions Precautions: Fall Precaution Comments: chronic 3L O2 [place] Restrictions Weight Bearing Restrictions: No      Mobility Bed Mobility               General bed mobility comments: oob  Transfers Overall transfer level: Needs assistance Equipment used: Rolling walker (2 wheeled) Transfers: Sit to/from Stand Sit to Stand: Min assist         General transfer comment: light min A to stand; cues for UE placement    Balance                                           ADL either performed or assessed with clinical judgement   ADL Overall ADL's : Needs assistance/impaired Eating/Feeding: Independent   Grooming: Set up;Sitting   Upper Body Bathing: Set up;Sitting   Lower Body Bathing: Moderate assistance;Sit to/from stand   Upper Body Dressing : Minimal assistance;Sitting(lines)   Lower Body Dressing: Maximal assistance;Sit to/from stand                 General ADL Comments: sister will help with adls as needed.  Only performed sit to  stand this session. Pt reports she got lightheaded earlier with PT     Vision         Perception     Praxis      Pertinent Vitals/Pain Pain Assessment: Faces Faces Pain Scale: Hurts a little bit Pain Location: abdomen  Pain Descriptors / Indicators: Sore     Hand Dominance     Extremity/Trunk Assessment Upper Extremity Assessment Upper Extremity Assessment: Overall WFL for tasks assessed(not resisted due to abd sx)           Communication Communication Communication: No difficulties   Cognition Arousal/Alertness: Awake/alert Behavior During Therapy: WFL for tasks assessed/performed Overall Cognitive Status: No family/caregiver present to determine baseline cognitive functioning                                 General Comments: appears wfls; yesterday had some confusion when OT attempted eval   General Comments  HR up to 114 with standing. BP 111/50, 02 94%    Exercises     Shoulder Instructions      Home Living Family/patient expects to be discharged to:: Private residence Living Arrangements: Other relatives(sister) Available Help at Discharge: Family Type of Home: Apartment Home Access: Level entry     Home Layout:  One level     Bathroom Shower/Tub: Teacher, early years/pre: Standard     Home Equipment: (02 concentrator)   Additional Comments: chronic oxygen      Prior Functioning/Environment Level of Independence: Independent        Comments: uses 3 liters        OT Problem List: Decreased strength;Decreased activity tolerance;Decreased knowledge of use of DME or AE;Pain;Cardiopulmonary status limiting activity      OT Treatment/Interventions: Self-care/ADL training;DME and/or AE instruction;Patient/family education;Therapeutic activities;Energy conservation    OT Goals(Current goals can be found in the care plan section) Acute Rehab OT Goals Patient Stated Goal: home OT Goal Formulation: With patient Time  For Goal Achievement: 02/10/18 Potential to Achieve Goals: Good ADL Goals Pt Will Transfer to Toilet: with supervision;ambulating;regular height toilet;bedside commode(vs) Pt Will Perform Tub/Shower Transfer: Tub transfer;with min guard assist;ambulating(with DME as needed) Additional ADL Goal #1: pt will perform bed mobility at supervison level from flat bed in preparation for adls  OT Frequency: Min 2X/week   Barriers to D/C:            Co-evaluation              AM-PAC PT "6 Clicks" Daily Activity     Outcome Measure Help from another person eating meals?: None Help from another person taking care of personal grooming?: A Little Help from another person toileting, which includes using toliet, bedpan, or urinal?: A Lot Help from another person bathing (including washing, rinsing, drying)?: A Lot Help from another person to put on and taking off regular upper body clothing?: A Little Help from another person to put on and taking off regular lower body clothing?: A Lot 6 Click Score: 16   End of Session    Activity Tolerance: Patient tolerated treatment well Patient left: with call bell/phone within reach;in chair;with nursing/sitter in room  OT Visit Diagnosis: Muscle weakness (generalized) (M62.81)                Time: 5638-7564 OT Time Calculation (min): 16 min Charges:  OT General Charges $OT Visit: 1 Visit OT Evaluation $OT Eval Low Complexity: 1 Low  Lesle Chris, OTR/L [phone] 01/27/2018  Alyne Martinson 01/27/2018, 12:25 PM

## 2018-01-27 NOTE — Progress Notes (Signed)
[facility] for Heparin, Warfarin Indication: mechanical MVR, mechanical AVR  No Known Allergies  Patient Measurements: Height: 5\' 5"  (165.1 cm) Weight: (bed malfunction/unable to get weight) IBW/kg (Calculated) : 57 Heparin Dosing Weight: 96 kg  Vital Signs: Temp: 97.9 F (36.6 C) (08/30 0350) Temp Source: Oral (08/30 0350) BP: 128/95 (08/30 0652) Pulse Rate: 92 (08/30 0652)  Labs: Recent Labs    01/25/18 0206  01/26/18 0303  01/26/18 1753 01/27/18 0105 01/27/18 0333  HGB 11.4*   < > 8.0*   < > 8.0* 8.4* 8.5*  HCT 36.1   < > 25.2*   < > 24.8* 25.8* 26.2*  PLT 234   < > 183  --  164  --  168  LABPROT 14.5  --  17.4*  --   --   --  17.1*  INR 1.14  --  1.43  --   --   --  1.41  HEPARINUNFRC 0.94*  --   --   --   --   --   --   CREATININE 1.48*  --  3.80*  --  4.47*  --  4.22*   < > = values in this interval not displayed.    Estimated Creatinine Clearance: 22.3 mL/min (A) (by C-G formula based on SCr of 4.22 mg/dL (H)).    Medications:  Scheduled:  . sodium chloride   Intravenous Once  . acetaminophen (TYLENOL) oral liquid 160 mg/5 mL  650 mg Oral Q6H  . gabapentin  200 mg Oral Q12H  . mouth rinse  15 mL Mouth Rinse BID  . mometasone-formoterol  2 puff Inhalation BID  . protein supplement shake  2 oz Oral Q2H  . Warfarin - Pharmacist Dosing Inpatient   Does not apply q1800   Infusions:  . sodium chloride    . sodium chloride Stopped (01/26/18 1243)  . famotidine (PEPCID) IV Stopped (01/26/18 2213)  . sodium chloride      Assessment: 84 yoF admitted on 8/27 for bariatric surgery, s/p laparoscopic sleeve gastrectomy.  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  Pharmacy was consulted to resume warfarin dosing and begin bridging with Heparin IV (OK to bolus) instead of planned Lovenox due to bleeding  risk.   PTA Lovenox 150mg  Muir q12h (Started 8/19, last dose on 8/26 at 0730)  PTA Warfarin 7.5 mg daily except 3.75 mg on Wed/Fridays (Last dose on 8/21.  INR subtherapeutic at 2.3 (goal INR 2.5-3.5) on 8/19 at coumadin clinic, but previously therapeutic on 7/31 and 7/10.)  Pre-op Lovenox 40mg  [place] given on 8/27 at 06:30.  Significant Events: 8/28  POD#1 RN reports some bleeding from two of the abdominal incisions. Heparin level 0.94 on 1600 units/hr.  Supratherapeutic level likely related to heparin bolus.  8/29 POD#2 RN reports no further bleeding.  Developed AKI.  Hgb decreased further, continue holding anticoagulation and transfuse PRBC. 8/30 POD#3: OK to resume anticoagulation per Cards and CCS.   Today, 01/27/2018:  SCr peaked yesterday at 4.47, this morning remains elevated but improved at 4.22  INR 1.41 (last warfarin dose on 8/27)  CBC: Hgb increased to 8.4 after transfusion yesterday.  This morning remains stable at 8.5.  Plt remain WNL.    No major current drug-drug interactions with warfarin; preoperative aprepitant x 1 on 01/25/18 at [facility]-prophylactic dosage is noted.    Diet: Bariatric liquid diet; protein  shakes with goal of 2 fl oz q2h if tolerated.    Warfarin dosing comment:  Published literature describing experiences of patients on chronic warfarin who undergo bariatric surgery demonstrates that on average, many patients require a significant reduction in their weekly warfarin dosage during the early post-operative period.   Mechanism has not been determined with certainty but could be secondary to post-operative alterations in vitamin K intake and absorption.   The weekly warfarin dosage requirement did typically return to pre-surgery levels at approximately 90-180 days post-operatively.    Goal of Therapy:  INR 2-3 Heparin level 0.3-0.7 units/ml Monitor platelets by anticoagulation protocol: Yes   Plan:  Warfarin 5 mg PO x1 dose, give early at 12:00 NO heparin  bolus per MD d/t postop anemia Start heparin IV infusion at 1550 units/hr  Heparin level 8 hours after starting Daily INR, heparin level, and CBC  Monitor for s/s bleeding.    Gretta Arab PharmD, BCPS Pager [phone] 01/27/2018 7:07 AM

## 2018-01-27 NOTE — Progress Notes (Signed)
Brief pharmacy anticoagulation note:  For full details see note from [facility] D from earlier today  Assessment and plan: -HL 0.15 subtherapeutic on 1550 units/hr -No noted bleeding or other issues per RN - no bolus per MD - increase heparin drip to 1800 units/hr - Heparin level in 6 hours  Dolly Rias RPh 01/27/2018, 8:33 PM Pager [phone]

## 2018-01-27 NOTE — Progress Notes (Addendum)
Progress Note  Patient Name: [name] Date of Encounter: 01/27/2018  Primary Cardiologist: Belva Crome III, MD   Subjective   Breathing at baseline, uses 3L O2 continuously at home. No chest pain  Inpatient Medications    Scheduled Meds: . sodium chloride   Intravenous Once  . acetaminophen (TYLENOL) oral liquid 160 mg/5 mL  650 mg Oral Q6H  . gabapentin  200 mg Oral Q12H  . mouth rinse  15 mL Mouth Rinse BID  . mometasone-formoterol  2 puff Inhalation BID  . protein supplement shake  2 oz Oral Q2H  . Warfarin - Pharmacist Dosing Inpatient   Does not apply q1800   Continuous Infusions: . sodium chloride    . sodium chloride Stopped (01/26/18 1243)  . famotidine (PEPCID) IV Stopped (01/26/18 2213)  . sodium chloride     PRN Meds: Place/Maintain arterial line **AND** sodium chloride, albuterol, morphine injection, ondansetron (ZOFRAN) IV, oxyCODONE, simethicone   Vital Signs    Vitals:   01/27/18 0350 01/27/18 0400 01/27/18 0500 01/27/18 0652  BP:  (!) 132/48 (!) 124/52 (!) 128/95  Pulse:  84 89 92  Resp:  15 (!) 21 (!) 23  Temp: 97.9 F (36.6 C)     TempSrc: Oral     SpO2:  100% 100% 100%  Weight:      Height:        Intake/Output Summary (Last 24 hours) at 01/27/2018 0745 Last data filed at 01/27/2018 0938 Gross per 24 hour  Intake 2105.71 ml  Output 2350 ml  Net -244.29 ml   Filed Weights   01/24/18 0600 01/26/18 1500  Weight: (!) 152.4 kg (!) 157.9 kg    Telemetry    NSR with 1st degree AVB, unknown rhythm around 7PM last night - Personally Reviewed - Personally Reviewed  ECG    No new EKG  Physical Exam   GEN: No acute distress.  On nasal cannula. Morbidly obese Neck: No JVD Cardiac: RRR, click noted Respiratory: Clear to auscultation bilaterally anteriorly, difficult to hear breath sound posteriorly. No rale, wheezing or rhonchi. GI: Soft, nontender, non-distended  MS: No deformity. Chronic RLE lymphedema, R > L swelling Neuro:   Nonfocal  Psych: Normal affect   Labs    Chemistry Recent Labs  Lab 01/25/18 0206 01/26/18 0303 01/26/18 1753 01/27/18 0333  NA 140 139 136 135  K 5.0 5.4* 5.0 5.1  CL 99 99 96* 95*  CO2 30 28 27 26   GLUCOSE 178* 135* 126* 118*  BUN 36* 64* 72* 73*  CREATININE 1.48* 3.80* 4.47* 4.22*  CALCIUM 9.1 8.9 9.0 8.9  PROT 7.5 6.8  --   --   ALBUMIN 3.9 3.9  --  3.6  AST 61* 48*  --   --   ALT 41 36  --   --   ALKPHOS 51 44  --   --   BILITOT 0.6 0.7  --   --   GFRNONAA 38* 12* 10* 11*  GFRAA 44* 14* 12* 12*  ANIONGAP 11 12 13 14      Hematology Recent Labs  Lab 01/26/18 0303  01/26/18 1753 01/27/18 0105 01/27/18 0333  WBC 17.8*  --  16.0*  --  16.1*  RBC 2.47*  --  2.49*  --  2.68*  HGB 8.0*   < > 8.0* 8.4* 8.5*  HCT 25.2*   < > 24.8* 25.8* 26.2*  MCV 102.0*  --  99.6  --  97.8  MCH 32.4  --  32.1  --  31.7  MCHC 31.7  --  32.3  --  32.4  RDW 15.7*  --  16.2*  --  16.7*  PLT 183  --  164  --  168   < > = values in this interval not displayed.    Cardiac EnzymesNo results for input(s): TROPONINI in the last 168 hours. No results for input(s): TROPIPOC in the last 168 hours.   BNPNo results for input(s): BNP, PROBNP in the last 168 hours.   DDimer No results for input(s): DDIMER in the last 168 hours.   Radiology    US Renal  Result Date: 01/26/2018 CLINICAL DATA:  Acute renal insufficiency.  Morbid obesity. EXAM: RENAL / URINARY TRACT ULTRASOUND COMPLETE COMPARISON:  Abdominal ultrasound 04/12/2017 FINDINGS: Right Kidney: Length: 10.2 cm. Echogenicity within normal limits. No mass or hydronephrosis visualized. Left Kidney: Length: 12.0 cm. Echogenicity within normal limits. No mass or hydronephrosis visualized. Bladder: Poorly visualized.  Urinary Foley catheter in place. Increased echogenicity of the liver. Small volume abdominal ascites. IMPRESSION: Study limited by body habitus. Normal appearance of the kidneys. Increased echogenicity of the liver. Small volume  perihepatic abdominal ascites. Electronically Signed   By: Fidela Salisbury M.D.   On: 01/26/2018 19:28   Dg Chest Port 1 View  Result Date: 01/25/2018 CLINICAL DATA:  Hypoxia.  Hernia repair. EXAM: PORTABLE CHEST 1 VIEW COMPARISON:  The one-view chest x-ray 01/24/2018 FINDINGS: Heart is enlarged. Lung volumes are low. Increased pulmonary vascular congestion is present. Bibasilar airspace disease likely reflects atelectasis. No other significant consolidation is present. The visualized soft tissues and bony thorax are unremarkable. IMPRESSION: 1. Cardiopulmonary with increased pulmonary vascular congestion compatible with early congestive heart failure. 2. Low lung volumes with increased bibasilar airspace disease, likely atelectasis. Electronically Signed   By: San Morelle M.D.   On: 01/25/2018 10:00    Cardiac Studies   Echo 12/03/2016 LV EF: 65% -   70% Study Conclusions  - Left ventricle: The cavity size was normal. Wall thickness was   increased in a pattern of moderate LVH. Systolic function was   vigorous. The estimated ejection fraction was in the range of 65%   to 70%. - Aortic valve: AV prosthesis is well seated Peak and mean   gradients through the valve atr 47 and 25 mm Hg respectively   These gradients are lower than those reported in echo of March   2018 - Mitral valve: MV prosthesis is diffiuclt to see well Peak and   mean gradients through the valve are 18 and 8 mm Hg respectively.   MVA by P T1/2 is 1.8 cm2 No significant MR. No significant change   in gradients from echo in March 2018. Valve area by pressure   half-time: 1.8 cm^2. - Pulmonary arteries: PA peak pressure: 43 mm Hg (S).   Patient Profile     57 y.o. female with a hx of evere aortic stenosis & moderate mitral stenosis s/p mechanical MVR/mechanical AVR at [facility] 07/2014 with post-op atrial flutter, no significant CAD prior to that surgery, chronic diastolic CHF, idiopathic pulmonary  hemosiderosis,moderate persistent asthma,ILD, polycythemia - requiring phlebotomies, more recent anemia,CKD stage III, HTN, HLD, Bell's palsy, morbid obesity on home O2 with exertionwho came in for gastric sleeve surgery. She was bridged prior to surgery. Posto course complicated by hypoxia and worsening anemia.   Assessment & Plan    1. Morbid obesity s/p gastric sleeve surgery with subsequent anemia  - attempted to bridge the  patient on heparin and coumadin post op, but hemoglobin dropped from 11.4 to 9.6 on 01/25/2018. Anemia reached nadir of 7.9 on 01/26/2018.   - heparin held during worsening anemia, hgb stable, patient seen by surgery, plan to restart heparin bridge with coumadin today (no heparin bolus, gtt only) 8/30  - start heparin gtt without bolus today, continue coumadin, INR 1.41  2. Hypotension: lopressor, lasix and losartan held. Did have a drop in BP last night, now stabilized with SBP in 120s. Consider restart metoprolol 12.5mg  BID tomorrow. Hold losartan given AKI, hold lasix.   3. Acute on chronic respiratory failure: continue [place]. On 24/7 3L O2 at home. Breathing at baseline. CXR obtained 2 days ago showed early CHF, but renal function has worsened in the last 2 days, holding off on restart lasix.   4. Acute on chronic renal insufficiency: renal doppler normal. Continue to hold lasix. Baseline Cr 1.3, Cr trended up to 4.47 on 8/29, today 4.22  5. H/o mechanical MVR and AVR: requried lovenox bridge prior to gastric sleeve surgery. Last echo 12/03/2016 showed stable aortic valve, mitral valve was hard to see but stable gradient. Consider restart home pravachol  6. Post op atrial flutter 2016: sinus rhythm with 1st degree AV block since surgery. Unknown rhythm around 7PM last night, only single strip, now back in sinus rhythm with 1st AVB       For questions or updates, please contact [place] Please consult [email or web address] for contact info under Cardiology/STEMI.        Signed, Almyra Deforest, PA  01/27/2018, 7:45 AM    Patient examined chart reviewed Hct stable post 1 unit transfusion S1 S2 click SEM no AR Obese with post operative abdominal wall Plus one edema. Agree with heparin and coumadin resumption trend Hct.   Jenkins Rouge

## 2018-01-27 NOTE — Progress Notes (Signed)
[facility] Progress Note   Subjective:   Feels overall ok.  Hb stable.  Ambulated with PT in hall today.  Was on 3L [place] (home O2 dose) until therapy now on 4L while recovering.    Objective Vitals:   01/27/18 0753 01/27/18 0800 01/27/18 0900 01/27/18 1000  BP:      Pulse:  87 93 96  Resp:  20 (!) 21 19  Temp:  98.1 F (36.7 C)    TempSrc:  Oral    SpO2: 95% 91% (!) 88% 94%  Weight:      Height:       Physical Exam General: obese, comfortable in chair Heart: RRR, mechanical heart sounds Lungs: clear, normal WOB Abdomen: extensive ecchymoses - unchanged from tomorrow Extremities: R lymphedema, L trace ankle edema and ruddy chronic discoloration  Additional Objective Labs: Basic Metabolic Panel: Recent Labs  Lab 01/26/18 0303 01/26/18 1753 01/27/18 0333  NA 139 136 135  K 5.4* 5.0 5.1  CL 99 96* 95*  CO2 28 27 26   GLUCOSE 135* 126* 118*  BUN 64* 72* 73*  CREATININE 3.80* 4.47* 4.22*  CALCIUM 8.9 9.0 8.9  PHOS  --   --  6.2*   Liver Function Tests: Recent Labs  Lab 01/25/18 0206 01/26/18 0303 01/27/18 0333  AST 61* 48*  --   ALT 41 36  --   ALKPHOS 51 44  --   BILITOT 0.6 0.7  --   PROT 7.5 6.8  --   ALBUMIN 3.9 3.9 3.6   No results for input(s): LIPASE, AMYLASE in the last 168 hours. CBC: Recent Labs  Lab 01/25/18 0206 01/25/18 0803  01/26/18 0303  01/26/18 1753 01/27/18 0105 01/27/18 0333  WBC 14.3* 19.0*  --  17.8*  --  16.0*  --  16.1*  NEUTROABS 12.6*  --   --  13.8*  --   --   --   --   HGB 11.4* 9.6*   < > 8.0*   < > 8.0* 8.4* 8.5*  HCT 36.1 30.5*   < > 25.2*   < > 24.8* 25.8* 26.2*  MCV 100.6* 102.0*  --  102.0*  --  99.6  --  97.8  PLT 234 246  --  183  --  164  --  168   < > = values in this interval not displayed.   Blood Culture    Component Value Date/Time   SDES BLOOD BLOOD RIGHT FOREARM 11/30/2016 2046   SPECREQUEST  11/30/2016 2046    BOTTLES DRAWN AEROBIC AND ANAEROBIC Blood Culture results may not be optimal  due to an inadequate volume of blood received in culture bottles   CULT NO GROWTH 5 DAYS 11/30/2016 2046   REPTSTATUS 12/05/2016 FINAL 11/30/2016 2046    Cardiac Enzymes: No results for input(s): CKTOTAL, CKMB, CKMBINDEX, TROPONINI in the last 168 hours. CBG: Recent Labs  Lab 01/26/18 1542  GLUCAP 122*   Iron Studies: No results for input(s): IRON, TIBC, TRANSFERRIN, FERRITIN in the last 72 hours. @lablastinr3 @ Studies/Results: US Renal  Result Date: 01/26/2018 CLINICAL DATA:  Acute renal insufficiency.  Morbid obesity. EXAM: RENAL / URINARY TRACT ULTRASOUND COMPLETE COMPARISON:  Abdominal ultrasound 04/12/2017 FINDINGS: Right Kidney: Length: 10.2 cm. Echogenicity within normal limits. No mass or hydronephrosis visualized. Left Kidney: Length: 12.0 cm. Echogenicity within normal limits. No mass or hydronephrosis visualized. Bladder: Poorly visualized.  Urinary Foley catheter in place. Increased echogenicity of the liver. Small volume abdominal ascites. IMPRESSION: Study limited  by body habitus. Normal appearance of the kidneys. Increased echogenicity of the liver. Small volume perihepatic abdominal ascites. Electronically Signed   By: Fidela Salisbury M.D.   On: 01/26/2018 19:28   Medications: . sodium chloride    . sodium chloride Stopped (01/26/18 1243)  . [START ON 01/28/2018] famotidine (PEPCID) IV    . heparin    . sodium chloride     . sodium chloride   Intravenous Once  . acetaminophen (TYLENOL) oral liquid 160 mg/5 mL  650 mg Oral Q6H  . gabapentin  200 mg Oral Q12H  . loratadine  10 mg Oral QHS  . mouth rinse  15 mL Mouth Rinse BID  . mometasone-formoterol  2 puff Inhalation BID  . montelukast  10 mg Oral QHS  . protein supplement shake  2 oz Oral Q2H  . warfarin  5 mg Oral Once  . 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 Neprhology is following for AKI on CKD.   1.  Acute on CKD:  In setting of acute blood loss and hypotension expect this is ATN and will need to run it's course and it appears her creatinine has peaked last night 4.47 -- this AM 4.22.  UOP improved after dose of lasix given for volume overload.  No current indications for dialysis and assuming no further insults I expect she'll continue to improve.  At this point will work to maintain euvolemia, avoid further nephrotoxic insults.   Renal US was limited due to habitus but overall normal.    2.  Acute on chronic Diastolic heart failure:  Back to home O2 requirement after diuresis yesterday.  Strict I/Os - she has foley catheter in and I'm fine with that coming out if she can collect urine volume for measurement; asked her sister to d/w RN re: foley.  Holding standing diuretic at this time.  Cardiology following, agree with cont to hold ARB in face of severe AKI.    3.  Hyperkalemia:  Mild.  Hasn't required therapy to date.  Continue to monitor.    4.  Anemia: acute blood loss - appears to have stabilized.  No indication for ESA.   5.  Hypertension, chronic: holding all home medications in face of post op hypotension.  If BP trends up continue to hold ARB and MRB until AKI resolves.   Jannifer Hick MD 01/27/2018, 11:27 AM  [facility] Pager: [phone]

## 2018-01-27 NOTE — Evaluation (Signed)
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 Pt currently with functional limitations due to the deficits listed below (see PT Problem List).  Pt will benefit from skilled PT to increase their independence and safety with mobility to allow discharge to the venue listed below.  Pt assisted with ambulating in hallway however a little dizzy and RR increased to 50 (recliner followed for safety).  Pt plans to d/c home and lives with her sister.    Follow Up Recommendations No PT follow up;Supervision for mobility/OOB    Equipment Recommendations  None recommended by PT    Recommendations for Other Services       Precautions / Restrictions Precautions Precautions: Fall Precaution Comments: chronic 3L O2 [place] Restrictions Weight Bearing Restrictions: No      Mobility  Bed Mobility Overal bed mobility: Needs Assistance Bed Mobility: Supine to Sit     Supine to sit: Min assist;HOB elevated     General bed mobility comments: assist for trunk upright, multiple lines  Transfers Overall transfer level: Needs assistance Equipment used: None Transfers: Sit to/from Stand Sit to Stand: Min assist;Min guard         General transfer comment: light min A to stand however improved to min/guard  Ambulation/Gait Ambulation/Gait assistance: Min guard;+2 safety/equipment Gait Distance (Feet): 70 Feet(total) Assistive device: None;IV Pole Gait Pattern/deviations: Step-through pattern;Decreased stride length     General Gait Details:  pt pushed IV pole but did not require for support; seated rest break after 20 feet due to dizziness (BP 110/48 mmHg)  SPO2 94% on 4L O2 [place] at that time however pt required increase for remainder of ambulation (states she sometimes increases liters at home with exertion) so applied 6L O2; RR increased to 50 so had pt sit down after again after another 50 feet (returned quickly to 20 RR with rest)  Stairs            Wheelchair Mobility    Modified Rankin (Stroke Patients Only)       Balance                                             Pertinent Vitals/Pain Pain Assessment: No/denies pain Faces Pain Scale: Hurts a little bit Pain Location: abdomen  Pain Descriptors / Indicators: Sore    Home Living Family/patient expects to be discharged to:: Private residence Living Arrangements: Other relatives(sister) Available Help at Discharge: Family Type of Home: Apartment Home Access: Level entry     Home Layout: One level Home Equipment: (02 concentrator) Additional Comments: chronic oxygen    Prior Function Level of Independence: Independent         Comments: uses 3 liters     Hand Dominance        Extremity/Trunk Assessment   Upper Extremity Assessment Upper Extremity Assessment: Overall WFL for tasks assessed(not resisted due to abd sx)    Lower Extremity Assessment Lower Extremity Assessment: Generalized weakness       Communication  Communication: No difficulties  Cognition Arousal/Alertness: Awake/alert Behavior During Therapy: WFL for tasks assessed/performed Overall Cognitive Status: Within Functional Limits for tasks assessed                                 General Comments: appears wfls; yesterday had some confusion when OT attempted eval      General Comments General comments (skin integrity, edema, etc.): HR up to 114 with standing. BP 111/50, 02 94%    Exercises     Assessment/Plan    PT Assessment  Patient needs continued PT services  PT Problem List Decreased strength;Decreased mobility;Decreased activity tolerance;Decreased knowledge of use of DME;Cardiopulmonary status limiting activity       PT Treatment Interventions DME instruction;Therapeutic activities;Gait training;Therapeutic exercise;Patient/family education;Functional mobility training    PT Goals (Current goals can be found in the Care Plan section)  Acute Rehab PT Goals Patient Stated Goal: home PT Goal Formulation: With patient Time For Goal Achievement: 02/03/18 Potential to Achieve Goals: Good    Frequency Min 3X/week   Barriers to discharge        Co-evaluation               AM-PAC PT "6 Clicks" Daily Activity  Outcome Measure Difficulty turning over in bed (including adjusting bedclothes, sheets and blankets)?: A Little Difficulty moving from lying on back to sitting on the side of the bed? : Unable Difficulty sitting down on and standing up from a chair with arms (e.g., wheelchair, bedside commode, etc,.)?: Unable Help needed moving to and from a bed to chair (including a wheelchair)?: A Little Help needed walking in hospital room?: A Little Help needed climbing 3-5 steps with a railing? : A Lot 6 Click Score: 13    End of Session Equipment Utilized During Treatment: Gait belt;Oxygen Activity Tolerance: Patient limited by fatigue Patient left: in chair;with call bell/phone within reach Nurse Communication: Mobility status PT Visit Diagnosis: Difficulty in walking, not elsewhere classified (R26.2)    Time: 4098-1191 PT Time Calculation (min) (ACUTE ONLY): 27 min   Charges:   PT Evaluation $PT Eval Moderate Complexity: 1 Mod         Carmelia Bake, PT, DPT 01/27/2018 Pager: [phone]   York Ram E 01/27/2018, 12:46 PM

## 2018-01-27 NOTE — Progress Notes (Signed)
Patient alert and oriented.  Answers questions appropriately.  Provided support and encouragement.  Encouraged pulmonary toilet, ambulation and small sips of liquids.  Discussed with patient plans for day including sitting in chair and working on discharge information with patient and sister Jackelyn Poling.  All questions answered.  Will continue to monitor.

## 2018-01-27 NOTE — Progress Notes (Signed)
Patient ID: [name], female   DOB: [birth year], 57 y.o.   MRN: [id number] Advance Surgery Progress Note:   3 Days Post-Op  Subjective: Mental status is clear;  Patient is up in chair receiving a bath Objective: Vital signs in last 24 hours: Temp:  [97.9 F (36.6 C)-99.1 F (37.3 C)] 98.6 F (37 C) (08/30 1200) Pulse Rate:  [84-114] 97 (08/30 1300) Resp:  [15-31] 17 (08/30 1300) BP: (65-151)/(33-136) 106/71 (08/30 1300) SpO2:  [88 %-100 %] 88 % (08/30 1300) Weight:  [157.9 kg] 157.9 kg (08/29 1500)  Intake/Output from previous day: 08/29 0701 - 08/30 0700 In: 2345.7 [P.O.:1080; I.V.:362.7; Blood:679; IV Piggyback:224] Out: 4098 [Urine:2350] Intake/Output this shift: Total I/O In: 113.2 [I.V.:14.9; IV Piggyback:98.3] Out: -   Physical Exam: Work of breathing is not labored at rest.  No complaints of pain  Lab Results:  Results for orders placed or performed during the hospital encounter of 01/24/18 (from the past 48 hour(s))  Hemoglobin and hematocrit, blood     Status: Abnormal   Collection Time: 01/25/18  4:11 PM  Result Value Ref Range   Hemoglobin 9.7 (L) 12.0 - 15.0 g/dL   HCT 30.7 (L) 36.0 - 46.0 %    Comment: Performed at [facility], [place] [address], [place], [place] [address]  CBC with Differential     Status: Abnormal   Collection Time: 01/26/18  3:03 AM  Result Value Ref Range   WBC 17.8 (H) 4.0 - 10.5 K/uL   RBC 2.47 (L) 3.87 - 5.11 MIL/uL   Hemoglobin 8.0 (L) 12.0 - 15.0 g/dL   HCT 25.2 (L) 36.0 - 46.0 %   MCV 102.0 (H) 78.0 - 100.0 fL   MCH 32.4 26.0 - 34.0 pg   MCHC 31.7 30.0 - 36.0 g/dL   RDW 15.7 (H) 11.5 - 15.5 %   Platelets 183 150 - 400 K/uL   Neutrophils Relative % 78 %   Neutro Abs 13.8 (H) 1.7 - 7.7 K/uL   Lymphocytes Relative 7 %   Lymphs Abs 1.3 0.7 - 4.0 K/uL   Monocytes Relative 15 %   Monocytes Absolute 2.8 (H) 0.1 - 1.0 K/uL   Eosinophils Relative 0 %   Eosinophils Absolute 0.0 0.0 - 0.7 K/uL    Basophils Relative 0 %   Basophils Absolute 0.0 0.0 - 0.1 K/uL    Comment: Performed at [facility], [place] [address], [place], [place] [address]  Protime-INR     Status: Abnormal   Collection Time: 01/26/18  3:03 AM  Result Value Ref Range   Prothrombin Time 17.4 (H) 11.4 - 15.2 seconds   INR 1.43     Comment: Performed at [facility], [place] [address] [place], [place] [address]  Comprehensive metabolic panel     Status: Abnormal   Collection Time: 01/26/18  3:03 AM  Result Value Ref Range   Sodium 139 135 - 145 mmol/L   Potassium 5.4 (H) 3.5 - 5.1 mmol/L   Chloride 99 98 - 111 mmol/L   CO2 28 22 - 32 mmol/L   Glucose, Bld 135 (H) 70 - 99 mg/dL   BUN 64 (H) 6 - 20 mg/dL   Creatinine, Ser 3.80 (H) 0.44 - 1.00 mg/dL    Comment: DELTA CHECK NOTED REPEATED TO VERIFY    Calcium 8.9 8.9 - 10.3 mg/dL   Total Protein 6.8 6.5 - 8.1 g/dL   Albumin 3.9 3.5 - 5.0 g/dL  AST 48 (H) 15 - 41 U/L   ALT 36 0 - 44 U/L   Alkaline Phosphatase 44 38 - 126 U/L   Total Bilirubin 0.7 0.3 - 1.2 mg/dL   GFR calc non Af Amer 12 (L) >60 mL/min   GFR calc Af Amer 14 (L) >60 mL/min    Comment: (NOTE) The eGFR has been calculated using the CKD EPI equation. This calculation has not been validated in all clinical situations. eGFR's persistently <60 mL/min signify possible Chronic Kidney Disease.    Anion gap 12 5 - 15    Comment: Performed at [facility], [place] [address], [place], [place] [address]  Prepare RBC     Status: None   Collection Time: 01/26/18 11:13 AM  Result Value Ref Range   Order Confirmation      ORDER PROCESSED BY BLOOD BANK Performed at [place] [address] [place], [place] [address]   MRSA PCR Screening     Status: None   Collection Time: 01/26/18  1:51 PM  Result Value Ref Range   MRSA by PCR NEGATIVE NEGATIVE    Comment:        The GeneXpert MRSA Assay (FDA approved for NASAL  specimens only), is one component of a comprehensive MRSA colonization surveillance program. It is not intended to diagnose MRSA infection nor to guide or monitor treatment for MRSA infections. Performed at [facility], [place] [address] [place], [place] [address]   Glucose, capillary     Status: Abnormal   Collection Time: 01/26/18  3:42 PM  Result Value Ref Range   Glucose-Capillary 122 (H) 70 - 99 mg/dL  Hemoglobin and hematocrit, blood     Status: Abnormal   Collection Time: 01/26/18  4:58 PM  Result Value Ref Range   Hemoglobin 7.9 (L) 12.0 - 15.0 g/dL   HCT 24.0 (L) 36.0 - 46.0 %    Comment: Performed at [facility], [place] [address], [place], [place] [address]  CBC     Status: Abnormal   Collection Time: 01/26/18  5:53 PM  Result Value Ref Range   WBC 16.0 (H) 4.0 - 10.5 K/uL   RBC 2.49 (L) 3.87 - 5.11 MIL/uL   Hemoglobin 8.0 (L) 12.0 - 15.0 g/dL   HCT 24.8 (L) 36.0 - 46.0 %   MCV 99.6 78.0 - 100.0 fL   MCH 32.1 26.0 - 34.0 pg   MCHC 32.3 30.0 - 36.0 g/dL   RDW 16.2 (H) 11.5 - 15.5 %   Platelets 164 150 - 400 K/uL    Comment: Performed at [facility], [place] [address] [place], [place] [address]  Basic metabolic panel     Status: Abnormal   Collection Time: 01/26/18  5:53 PM  Result Value Ref Range   Sodium 136 135 - 145 mmol/L   Potassium 5.0 3.5 - 5.1 mmol/L   Chloride 96 (L) 98 - 111 mmol/L   CO2 27 22 - 32 mmol/L   Glucose, Bld 126 (H) 70 - 99 mg/dL   BUN 72 (H) 6 - 20 mg/dL   Creatinine, Ser 4.47 (H) 0.44 - 1.00 mg/dL   Calcium 9.0 8.9 - 10.3 mg/dL   GFR calc non Af Amer 10 (L) >60 mL/min   GFR calc Af Amer 12 (L) >60 mL/min    Comment: (NOTE) The eGFR has been calculated using the CKD EPI equation. This calculation has not been validated in all clinical situations.  eGFR's persistently <60 mL/min signify possible Chronic Kidney Disease.    Anion gap 13 5 - 15    Comment: Performed at [facility], [place] [address], [place], [place] [address]  Prepare RBC     Status: None   Collection Time: 01/26/18  6:55 PM  Result Value Ref Range   Order Confirmation      ORDER PROCESSED BY BLOOD BANK Performed at [facility], [place] [address] [place], [place] [address]   Hemoglobin and hematocrit, blood     Status: Abnormal   Collection Time: 01/27/18  1:05 AM  Result Value Ref Range   Hemoglobin 8.4 (L) 12.0 - 15.0 g/dL   HCT 25.8 (L) 36.0 - 46.0 %    Comment: Performed at [facility], [place] [address], [place], [place] [address]  CBC     Status: Abnormal   Collection Time: 01/27/18  3:33 AM  Result Value Ref Range   WBC 16.1 (H) 4.0 - 10.5 K/uL   RBC 2.68 (L) 3.87 - 5.11 MIL/uL   Hemoglobin 8.5 (L) 12.0 - 15.0 g/dL   HCT 26.2 (L) 36.0 - 46.0 %   MCV 97.8 78.0 - 100.0 fL   MCH 31.7 26.0 - 34.0 pg   MCHC 32.4 30.0 - 36.0 g/dL   RDW 16.7 (H) 11.5 - 15.5 %   Platelets 168 150 - 400 K/uL    Comment: Performed at [facility], [place] [address], [place], [place] [address]  Protime-INR     Status: Abnormal   Collection Time: 01/27/18  3:33 AM  Result Value Ref Range   Prothrombin Time 17.1 (H) 11.4 - 15.2 seconds   INR 1.41     Comment: Performed at [facility], [place] [address], [place], [place] [address]  Renal function panel     Status: Abnormal   Collection Time: 01/27/18  3:33 AM  Result Value Ref Range   Sodium 135 135 - 145 mmol/L   Potassium 5.1 3.5 - 5.1 mmol/L   Chloride 95 (L) 98 - 111 mmol/L   CO2 26 22 - 32 mmol/L   Glucose, Bld 118 (H) 70 - 99 mg/dL   BUN 73 (H) 6 - 20 mg/dL   Creatinine, Ser 4.22 (H) 0.44 - 1.00 mg/dL   Calcium 8.9 8.9 - 10.3 mg/dL   Phosphorus 6.2 (H) 2.5 - 4.6 mg/dL   Albumin 3.6 3.5 - 5.0 g/dL   GFR calc non Af Amer 11 (L) >60 mL/min   GFR calc Af Amer 12 (L) >60 mL/min    Comment: (NOTE) The eGFR has been calculated using the CKD EPI equation. This  calculation has not been validated in all clinical situations. eGFR's persistently <60 mL/min signify possible Chronic Kidney Disease.    Anion gap 14 5 - 15    Comment: Performed at [facility], [place] [address], [place], [place] [address]    Radiology/Results: US Renal  Result Date: 01/26/2018 CLINICAL DATA:  Acute renal insufficiency.  Morbid obesity. EXAM: RENAL / URINARY TRACT ULTRASOUND COMPLETE COMPARISON:  Abdominal ultrasound 04/12/2017 FINDINGS: Right Kidney: Length: 10.2 cm. Echogenicity within normal limits. No mass or hydronephrosis visualized. Left Kidney: Length: 12.0 cm. Echogenicity within normal limits. No mass or hydronephrosis visualized. Bladder: Poorly visualized.  Urinary Foley catheter in place. Increased echogenicity of the liver. Small volume abdominal ascites. IMPRESSION: Study limited by body habitus. Normal appearance of the kidneys. Increased echogenicity of the liver. Small  volume perihepatic abdominal ascites. Electronically Signed   By: Fidela Salisbury M.D.   On: 01/26/2018 19:28    Anti-infectives: Anti-infectives (From admission, onward)   Start     Dose/Rate Route Frequency Ordered Stop   01/24/18 0615  cefoTEtan (CEFOTAN) 2 g in sodium chloride 0.9 % 100 mL IVPB     2 g 200 mL/hr over 30 Minutes Intravenous On call to O.R. 01/24/18 0606 01/24/18 0753      Assessment/Plan: Problem List: Patient Active Problem List   Diagnosis Date Noted  . Acute renal failure superimposed on stage 3 chronic kidney disease ([place]) 01/26/2018  . PAF (paroxysmal atrial fibrillation) ([place])   . Chronic anticoagulation   . Acute respiratory failure ([place])   . Hypoxia   . Postprocedural hypotension   . Chronic acquired lymphedema - Right lower extremity 01/24/2018  . Hx of mechanical aortic valve replacement 01/24/2018  . Morbid obesity ([place]) 01/24/2018  . BMI 50.0-59.9, adult ([place]) 10/12/2017  . Right-sided Bell's palsy 02/07/2017  . Obstructive  sleep apnea 02/03/2017  . Acute diastolic heart failure ([place]) 01/11/2017  . Cellulitis 11/30/2016  . Chronic respiratory failure ([place]) 08/12/2015  . 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 Dyspnea 07/22/2015  . Allergic rhinitis 05/02/2015  . Exertional dyspnea   . Symptomatic anemia 04/08/2015  . History of mitral valve replacement with mechanical valve 04/08/2015  . CAP (community acquired pneumonia) 04/08/2015  . CKD (chronic kidney disease) stage 3, GFR 30-59 ml/min (HCC) 04/08/2015  . Atrial flutter, unspecified   . ILD (interstitial lung disease) ([place]) 10/12/2013  . Aortic stenosis 10/02/2013  . Essential hypertension 10/02/2013  . Hyperlipidemia 10/02/2013  . Chronic asthmatic bronchitis ([place]) 08/06/2013  . Abnormal CT scan, chest 08/09/2012    Foley in place to monitor urine output;  Heparin drip restarted.  She looked better than I was anticipating.  Continue ICU observation 3 Days Post-Op    LOS: 3 days   Matt B. Hassell Done, MD, [facility] [phone] beeper [phone]  01/27/2018 1:56 PM

## 2018-01-27 NOTE — Progress Notes (Signed)
[name]  IFO:[id number] DOB: November 17, 1960 DOA: 01/24/2018 PCP: [name], FNP    Reason for Consult / Chief Complaint:  Postop pulmonary assessment, hypoxic resp failure  Consulting MD:  Dr. 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  She follows with Dr. Lake [name] in the clinic. 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 No evidence of vasculitis.  Underwent aortic, mitral valve replacement in 2016 on anticoag.  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  Transferred to SDU on 12 lt O2. PCCM consulted for help with management. Experienced some hypotension in setting acute blood loss anemia 8/29, acute on chronic renal failure.  Some improvement in oxygen need, renal function 8/30.  Tolerating a clear diet and planning to reinitiate anticoagulation.  Subjective / Interval Events  Feels well, a bit stronger.  Tolerating clear diet Received 2 units blood 8/29 Seen by nephrology and single dose Lasix 120 mg given with adequate diuresis (I = O, 24h) O2 weaned to 3.5 L/min   Drop in hemoglobin from 9.7-8.0, heparin drip held Decreased urine output in the setting some hypotension, received IV fluid bolus overnight, blood pressure medications held Current oxygen need 8 L per nasal cannula, SPO2 97%  Assessment & Plan:  Hypotension, improved.  Lactate 1.6 8/28 History hypertension, mitral valve replacement Multifactorial, blood loss anemia, sedating medications postop, volume shifts-improved Should be able to back off on volume resuscitation, some evidence for pulmonary edema noted on exam, chest x-ray Follow hemoglobin, no indication for  transfusion at this time Heparin to be restarted 8/30 without a bolus, Coumadin dose per pharmacy Goal restart metoprolol 12.5 mg twice daily on 8/31.  Losartan on hold.  Diuretics being dosed daily with assistance of nephrology  Acute anemia, suspect volume shifts and acute post-op losses Follow CBC, especially with reinitiation anticoagulation Pepcid as ordered   Acute on chronic (stage 3) renal failure, ATN in setting of anemia and hypotension. S Cr appears to have plateaued 8/29 Appreciate nephrology assistance, dosing Lasix daily based on overall volume status and renal recovery  Hyperkalemia Following BMP  Hypoxic respiratory failure, atelectasis, mild acute diastolic CHF.  No clear evidence of flaring of her chronic lung issues Continue to wean oxygen as able, home 3 L/min Up out of bed, push pulmonary hygiene, IS   Interstitial lung disease, chronic hypoxic respiratory failure No evidence of acute flare Continue to wean oxygen  Moderate persistent asthma.  No evidence of wheezing Continue Dulera scheduled, albuterol as needed We will restart Zyrtec and Singulair on 8/30  Mechanical mitral valve, aortic Restarting heparin without a bolus, Coumadin Following CBC  Best Practice / Goals of Care / Disposition.   DVT prophylaxis: SCD GI prophylaxis: Pepcid Diet: clear diet Mobility: OOB assist Code Status: Full Family Communication: Pt updated 8/29 and 8/30  Disposition / Summary of Today's Plan 01/27/18    Consultants:  Cardiology Nephrology   Procedures:   Significant Diagnostic Tests: Sleep study 03/04/17 - No significant obstructive sleep apnea occurred during this study (AHI = 1.3/h). - No significant central sleep apnea occurred during this study (CAI = 0.0/h). - Mild oxygen desaturation was noted during this study (Min O2 = 87.00%). -  The patient snored with soft snoring volume.  Lung biopsy 10/03/2013- hemosiderin laden lung macrophages, mild chronic  fibrosis, no vasculitis.  CT high-res 12/12/2013- mild lower lung centrilobular nodularity, air trapping Chest x-ray 01/12/2018- no acute cardiopulmonary abnormality. Chest x-ray 01/25/2018- no acute cardiopulmonary abnormality.   Micro Data:   Antimicrobials:  Cefotan 8/27> 8/28   Objective   Gen:      Pleasant obese woman, comfortable lying in bed on nasal cannula oxygen 3.5 L/min, SPO2 88 to 92% HEENT: No oral lesions, poor dentition Neck:    No stridor Lungs: Small breaths, no wheezing normal effort CV:       Regular, distant, no murmur heard Abd:   Nontender, nondistended, positive bowel sounds Ext: No significant edema Skin:     No rash Neuro: Awake, appropriate, nonfocal  Blood pressure (!) 128/95, pulse 92, temperature 98.1 F (36.7 C), temperature source Oral, resp. rate (!) 23, height 5\' 5"  (1.651 m), weight (!) 157.9 kg, SpO2 95 %.        Intake/Output Summary (Last 24 hours) at 01/27/2018 0927 Last data filed at 01/27/2018 0655 Gross per 24 hour  Intake 2105.71 ml  Output 2350 ml  Net -244.29 ml   Filed Weights   01/24/18 0600 01/26/18 1500  Weight: (!) 152.4 kg (!) 157.9 kg     Labs   CBC: Recent Labs  Lab 01/25/18 0206 01/25/18 0803  01/26/18 0303 01/26/18 1658 01/26/18 1753 01/27/18 0105 01/27/18 0333  WBC 14.3* 19.0*  --  17.8*  --  16.0*  --  16.1*  NEUTROABS 12.6*  --   --  13.8*  --   --   --   --   HGB 11.4* 9.6*   < > 8.0* 7.9* 8.0* 8.4* 8.5*  HCT 36.1 30.5*   < > 25.2* 24.0* 24.8* 25.8* 26.2*  MCV 100.6* 102.0*  --  102.0*  --  99.6  --  97.8  PLT 234 246  --  183  --  164  --  168   < > = values in this interval not displayed.   Basic Metabolic Panel: Recent Labs  Lab 01/25/18 0206 01/26/18 0303 01/26/18 1753 01/27/18 0333  NA 140 139 136 135  K 5.0 5.4* 5.0 5.1  CL 99 99 96* 95*  CO2 30 28 27 26   GLUCOSE 178* 135* 126* 118*  BUN 36* 64* 72* 73*  CREATININE 1.48* 3.80* 4.47* 4.22*  CALCIUM 9.1 8.9 9.0 8.9  PHOS  --   --    --  6.2*   GFR: Estimated Creatinine Clearance: 22.3 mL/min (A) (by C-G formula based on SCr of 4.22 mg/dL (H)). Recent Labs  Lab 01/25/18 0803 01/25/18 1011 01/26/18 0303 01/26/18 1753 01/27/18 0333  WBC 19.0*  --  17.8* 16.0* 16.1*  LATICACIDVEN  --  1.6  --   --   --    Liver Function Tests: Recent Labs  Lab 01/25/18 0206 01/26/18 0303 01/27/18 0333  AST 61* 48*  --   ALT 41 36  --   ALKPHOS 51 44  --   BILITOT 0.6 0.7  --   PROT 7.5 6.8  --   ALBUMIN 3.9 3.9 3.6   No results for input(s): LIPASE, AMYLASE in the last 168 hours. No results for input(s): AMMONIA in the last 168 hours. ABG    Component Value Date/Time   PHART 7.287 (L) 10/04/2013 0853   PCO2ART 54.0 (H) 10/04/2013 0853   PO2ART 61.0 (L) 10/04/2013 7253  HCO3 25.9 (H) 10/04/2013 0853   TCO2 28 10/04/2013 0853   ACIDBASEDEF 2.0 10/04/2013 0853   O2SAT 88.0 10/04/2013 0853    Coagulation Profile: Recent Labs  Lab 01/24/18 0607 01/25/18 0206 01/26/18 0303 01/27/18 0333  INR 1.08 1.14 1.43 1.41   Cardiac Enzymes: No results for input(s): CKTOTAL, CKMB, CKMBINDEX, TROPONINI in the last 168 hours. HbA1C: No results found for: HGBA1C CBG: Recent Labs  Lab 01/26/18 1542  GLUCAP 122*     Review of Systems:   All negative; except for those that are bolded, which indicate positives.  Constitutional: weight loss, weight gain, night sweats, fevers, chills, fatigue, weakness.  HEENT: headaches, sore throat, sneezing, nasal congestion, post nasal drip, difficulty swallowing, tooth/dental problems, visual complaints, visual changes, ear aches. Neuro: difficulty with speech, weakness, numbness, ataxia. CV:  chest pain, orthopnea, PND, swelling in lower extremities, dizziness, palpitations, syncope.  Resp: cough, hemoptysis, dyspnea, wheezing. GI: heartburn, indigestion, abdominal pain, nausea, vomiting, diarrhea, constipation, change in bowel habits, loss of appetite, hematemesis, melena,  hematochezia.  GU: dysuria, change in color of urine, urgency or frequency, flank pain, hematuria. MSK: joint pain or swelling, decreased range of motion. Psych: change in mood or affect, depression, anxiety, suicidal ideations, homicidal ideations. Skin: rash, itching, bruising.     LOS: 3 days   PCCM will sign off. Please call if we can assist further.   Baltazar Apo, MD, PhD 01/27/2018, 9:27 AM [facility] [phone] or if no answer [phone]

## 2018-01-27 NOTE — Progress Notes (Signed)
Patient alert and oriented, pain is controlled. Patient is tolerating fluids, advanced to protein shake, patient is tolerating well.  Reviewed Gastric sleeve discharge instructions with patient and patient is able to articulate understanding.  Provided information on BELT program, Support Group and [facility] outpatient pharmacy. All questions answered, will continue to monitor and reeducate as needed with hospital stay.

## 2018-01-28 DIAGNOSIS — Z6841 Body Mass Index (BMI) 40.0 and over, adult: Secondary | ICD-10-CM

## 2018-01-28 LAB — CBC
HCT: 23.8 % — ABNORMAL LOW (ref 36.0–46.0)
Hemoglobin: 7.8 g/dL — ABNORMAL LOW (ref 12.0–15.0)
MCH: 31.7 pg (ref 26.0–34.0)
MCHC: 32.8 g/dL (ref 30.0–36.0)
MCV: 96.7 fL (ref 78.0–100.0)
Platelets: 173 10*3/uL (ref 150–400)
RBC: 2.46 MIL/uL — AB (ref 3.87–5.11)
RDW: 16.4 % — ABNORMAL HIGH (ref 11.5–15.5)
WBC: 11.5 10*3/uL — ABNORMAL HIGH (ref 4.0–10.5)

## 2018-01-28 LAB — BASIC METABOLIC PANEL
Anion gap: 11 (ref 5–15)
BUN: 80 mg/dL — ABNORMAL HIGH (ref 6–20)
CHLORIDE: 98 mmol/L (ref 98–111)
CO2: 28 mmol/L (ref 22–32)
Calcium: 9.3 mg/dL (ref 8.9–10.3)
Creatinine, Ser: 3.33 mg/dL — ABNORMAL HIGH (ref 0.44–1.00)
GFR calc non Af Amer: 14 mL/min — ABNORMAL LOW (ref >60)
GFR, EST AFRICAN AMERICAN: 16 mL/min — AB (ref >60)
GLUCOSE: 111 mg/dL — AB (ref 70–99)
Potassium: 4.9 mmol/L (ref 3.5–5.1)
Sodium: 137 mmol/L (ref 135–145)

## 2018-01-28 LAB — PHOSPHORUS: PHOSPHORUS: 5.4 mg/dL — AB (ref 2.5–4.6)

## 2018-01-28 LAB — PROTIME-INR
INR: 1.37
Prothrombin Time: 16.7 seconds — ABNORMAL HIGH (ref 11.4–15.2)

## 2018-01-28 LAB — HEPARIN LEVEL (UNFRACTIONATED)
HEPARIN UNFRACTIONATED: 0.28 [IU]/mL — AB (ref 0.30–0.70)
HEPARIN UNFRACTIONATED: 0.32 [IU]/mL (ref 0.30–0.70)
Heparin Unfractionated: 0.34 IU/mL (ref 0.30–0.70)

## 2018-01-28 LAB — MAGNESIUM: Magnesium: 2.4 mg/dL (ref 1.7–2.4)

## 2018-01-28 MED ORDER — WARFARIN SODIUM 5 MG PO TABS
5.0000 mg | ORAL_TABLET | Freq: Once | ORAL | Status: AC
  Administered 2018-01-28: 5 mg via ORAL
  Filled 2018-01-28: qty 1

## 2018-01-28 NOTE — Progress Notes (Signed)
Assumed care of patient at 1100 . Agree with previous Nurse assessment.  Barbee Shropshire. Brigitte Pulse, RN

## 2018-01-28 NOTE — Progress Notes (Signed)
Patient ID: [name], female   DOB: Jan 25, 1961, 57 y.o.   MRN: [id number] [facility] Surgery Progress Note:   3 Days Post-Op  Subjective: No acute events overnight.    Objective: Vital signs in last 24 hours: Temp:  [97.9 F (36.6 C)-99.7 F (37.6 C)] 98.1 F (36.7 C) (08/31 0812) Pulse Rate:  [77-131] 81 (08/31 0600) Resp:  [16-31] 16 (08/31 0600) BP: (97-151)/(43-136) 97/56 (08/31 0600) SpO2:  [87 %-97 %] 92 % (08/31 0600)  Intake/Output from previous day: 08/30 0701 - 08/31 0700 In: 1642.4 [P.O.:300; I.V.:1244.1; IV Piggyback:98.3] Out: 2875 [Urine:2875] Intake/Output this shift: No intake/output data recorded.  Physical Exam:  NAD Abd: soft  Lab Results:  Results for orders placed or performed during the hospital encounter of 01/24/18 (from the past 48 hour(s))  Prepare RBC     Status: None   Collection Time: 01/26/18 11:13 AM  Result Value Ref Range   Order Confirmation      ORDER PROCESSED BY BLOOD BANK Performed at [place] [address], [place], [place] [address]   MRSA PCR Screening     Status: None   Collection Time: 01/26/18  1:51 PM  Result Value Ref Range   MRSA by PCR NEGATIVE NEGATIVE    Comment:        The GeneXpert MRSA Assay (FDA approved for NASAL specimens only), is one component of a comprehensive MRSA colonization surveillance program. It is not intended to diagnose MRSA infection nor to guide or monitor treatment for MRSA infections. Performed at [facility], [place] [address] [place], [place] [address]   Glucose, capillary     Status: Abnormal   Collection Time: 01/26/18  3:42 PM  Result Value Ref Range   Glucose-Capillary 122 (H) 70 - 99 mg/dL  Hemoglobin and hematocrit, blood     Status: Abnormal   Collection Time: 01/26/18  4:58 PM  Result Value Ref Range   Hemoglobin 7.9 (L) 12.0 - 15.0 g/dL   HCT 24.0 (L) 36.0 - 46.0 %    Comment: Performed at [facility], [place] [address], [place], [place] [address]  CBC     Status: Abnormal   Collection Time: 01/26/18  5:53 PM  Result Value Ref Range   WBC 16.0 (H) 4.0 - 10.5 K/uL   RBC 2.49 (L) 3.87 - 5.11 MIL/uL   Hemoglobin 8.0 (L) 12.0 - 15.0 g/dL   HCT 24.8 (L) 36.0 - 46.0 %   MCV 99.6 78.0 - 100.0 fL   MCH 32.1 26.0 - 34.0 pg   MCHC 32.3 30.0 - 36.0 g/dL   RDW 16.2 (H) 11.5 - 15.5 %   Platelets 164 150 - 400 K/uL    Comment: Performed at [facility], [place] [address] [place], [place] [address]  Basic metabolic panel     Status: Abnormal   Collection Time: 01/26/18  5:53 PM  Result Value Ref Range   Sodium 136 135 - 145 mmol/L   Potassium 5.0 3.5 - 5.1 mmol/L   Chloride 96 (L) 98 - 111 mmol/L   CO2 27 22 - 32 mmol/L   Glucose, Bld 126 (H) 70 - 99 mg/dL   BUN 72 (H) 6 - 20 mg/dL   Creatinine, Ser 4.47 (H) 0.44 - 1.00 mg/dL   Calcium 9.0 8.9 - 10.3 mg/dL   GFR calc non Af Amer 10 (L) >60 mL/min   GFR calc Af Amer 12 (L) >60 mL/min  Comment: (NOTE) The eGFR has been calculated using the CKD EPI equation. This calculation has not been validated in all clinical situations. eGFR's persistently <60 mL/min signify possible Chronic Kidney Disease.    Anion gap 13 5 - 15    Comment: Performed at [facility], [place] [address] [place], [place] [address]  Prepare RBC     Status: None   Collection Time: 01/26/18  6:55 PM  Result Value Ref Range   Order Confirmation      ORDER PROCESSED BY BLOOD BANK Performed at [facility], [place] [address] [place], [place] [address]   Hemoglobin and hematocrit, blood     Status: Abnormal   Collection Time: 01/27/18  1:05 AM  Result Value Ref Range   Hemoglobin 8.4 (L) 12.0 - 15.0 g/dL   HCT 25.8 (L) 36.0 - 46.0 %    Comment: Performed at [facility], [place] [address], [place], [place] [address]  CBC     Status: Abnormal   Collection Time: 01/27/18  3:33 AM  Result Value  Ref Range   WBC 16.1 (H) 4.0 - 10.5 K/uL   RBC 2.68 (L) 3.87 - 5.11 MIL/uL   Hemoglobin 8.5 (L) 12.0 - 15.0 g/dL   HCT 26.2 (L) 36.0 - 46.0 %   MCV 97.8 78.0 - 100.0 fL   MCH 31.7 26.0 - 34.0 pg   MCHC 32.4 30.0 - 36.0 g/dL   RDW 16.7 (H) 11.5 - 15.5 %   Platelets 168 150 - 400 K/uL    Comment: Performed at [facility], [place] [address] [place], [place] [address]  Protime-INR     Status: Abnormal   Collection Time: 01/27/18  3:33 AM  Result Value Ref Range   Prothrombin Time 17.1 (H) 11.4 - 15.2 seconds   INR 1.41     Comment: Performed at [facility], [place] [address], [place], [place] [address]  Renal function panel     Status: Abnormal   Collection Time: 01/27/18  3:33 AM  Result Value Ref Range   Sodium 135 135 - 145 mmol/L   Potassium 5.1 3.5 - 5.1 mmol/L   Chloride 95 (L) 98 - 111 mmol/L   CO2 26 22 - 32 mmol/L   Glucose, Bld 118 (H) 70 - 99 mg/dL   BUN 73 (H) 6 - 20 mg/dL   Creatinine, Ser 4.22 (H) 0.44 - 1.00 mg/dL   Calcium 8.9 8.9 - 10.3 mg/dL   Phosphorus 6.2 (H) 2.5 - 4.6 mg/dL   Albumin 3.6 3.5 - 5.0 g/dL   GFR calc non Af Amer 11 (L) >60 mL/min   GFR calc Af Amer 12 (L) >60 mL/min    Comment: (NOTE) The eGFR has been calculated using the CKD EPI equation. This calculation has not been validated in all clinical situations. eGFR's persistently <60 mL/min signify possible Chronic Kidney Disease.    Anion gap 14 5 - 15    Comment: Performed at [facility], [place] [address], [place], [place] [address]  Heparin level (unfractionated)     Status: Abnormal   Collection Time: 01/27/18  7:32 PM  Result Value Ref Range   Heparin Unfractionated 0.15 (L) 0.30 - 0.70 IU/mL    Comment: (NOTE) If heparin results are below expected values, and patient dosage has  been confirmed, suggest follow up testing of antithrombin III levels. Performed at [facility], [place] [address] [place], [place]  [address]  CBC     Status: Abnormal   Collection Time: 01/28/18  2:17 AM  Result Value Ref Range   WBC 11.5 (H) 4.0 - 10.5 K/uL   RBC 2.46 (L) 3.87 - 5.11 MIL/uL   Hemoglobin 7.8 (L) 12.0 - 15.0 g/dL   HCT 23.8 (L) 36.0 - 46.0 %   MCV 96.7 78.0 - 100.0 fL   MCH 31.7 26.0 - 34.0 pg   MCHC 32.8 30.0 - 36.0 g/dL   RDW 16.4 (H) 11.5 - 15.5 %   Platelets 173 150 - 400 K/uL    Comment: Performed at [facility], [place] [address] [place], [place] [address]  Protime-INR     Status: Abnormal   Collection Time: 01/28/18  2:17 AM  Result Value Ref Range   Prothrombin Time 16.7 (H) 11.4 - 15.2 seconds   INR 1.37     Comment: Performed at [facility], [place] [address] [place], [place] [address]  Basic metabolic panel     Status: Abnormal   Collection Time: 01/28/18  2:17 AM  Result Value Ref Range   Sodium 137 135 - 145 mmol/L   Potassium 4.9 3.5 - 5.1 mmol/L   Chloride 98 98 - 111 mmol/L   CO2 28 22 - 32 mmol/L   Glucose, Bld 111 (H) 70 - 99 mg/dL   BUN 80 (H) 6 - 20 mg/dL   Creatinine, Ser 3.33 (H) 0.44 - 1.00 mg/dL   Calcium 9.3 8.9 - 10.3 mg/dL   GFR calc non Af Amer 14 (L) >60 mL/min   GFR calc Af Amer 16 (L) >60 mL/min    Comment: (NOTE) The eGFR has been calculated using the CKD EPI equation. This calculation has not been validated in all clinical situations. eGFR's persistently <60 mL/min signify possible Chronic Kidney Disease.    Anion gap 11 5 - 15    Comment: Performed at [facility], [place] [address], [place], [place] [address]  Magnesium     Status: None   Collection Time: 01/28/18  2:17 AM  Result Value Ref Range   Magnesium 2.4 1.7 - 2.4 mg/dL    Comment: Performed at [facility], [place] [address], [place], [place] [address]  Phosphorus     Status: Abnormal   Collection Time: 01/28/18  2:17 AM  Result Value Ref Range   Phosphorus 5.4 (H) 2.5 - 4.6 mg/dL    Comment: Performed at [facility], [place] [address], [place], [place] [address]  Heparin level (unfractionated)     Status: Abnormal   Collection Time: 01/28/18  2:17 AM  Result Value Ref Range   Heparin Unfractionated 0.28 (L) 0.30 - 0.70 IU/mL    Comment: (NOTE) If heparin results are below expected values, and patient dosage has  been confirmed, suggest follow up testing of antithrombin III levels. Performed at [facility], [place] [address] [place], [place] [address]     Radiology/Results: US Renal  Result Date: 01/26/2018 CLINICAL DATA:  Acute renal insufficiency.  Morbid obesity. EXAM: RENAL / URINARY TRACT ULTRASOUND COMPLETE COMPARISON:  Abdominal ultrasound 04/12/2017 FINDINGS: Right Kidney: Length: 10.2 cm. Echogenicity within normal limits. No mass or hydronephrosis visualized. Left Kidney: Length: 12.0 cm. Echogenicity within normal limits. No mass or hydronephrosis visualized. Bladder: Poorly visualized.  Urinary Foley catheter in place. Increased echogenicity of the liver. Small volume abdominal ascites. IMPRESSION: Study limited by body habitus. Normal appearance of the kidneys. Increased echogenicity of  the liver. Small volume perihepatic abdominal ascites. Electronically Signed   By: Fidela Salisbury M.D.   On: 01/26/2018 19:28    Anti-infectives: Anti-infectives (From admission, onward)   Start     Dose/Rate Route Frequency Ordered Stop   01/24/18 0615  cefoTEtan (CEFOTAN) 2 g in sodium chloride 0.9 % 100 mL IVPB     2 g 200 mL/hr over 30 Minutes Intravenous On call to O.R. 01/24/18 0606 01/24/18 0753      Assessment/Plan: Problem List: Patient Active Problem List   Diagnosis Date Noted  . Acute renal failure superimposed on stage 3 chronic kidney disease ([place]) 01/26/2018  . PAF (paroxysmal atrial fibrillation) ([place])   . Chronic anticoagulation   . Acute respiratory failure ([place])   . Hypoxia   . Postprocedural hypotension   . Chronic acquired lymphedema -  Right lower extremity 01/24/2018  . Hx of mechanical aortic valve replacement 01/24/2018  . Morbid obesity ([place]) 01/24/2018  . BMI 50.0-59.9, adult ([place]) 10/12/2017  . Right-sided Bell's palsy 02/07/2017  . Obstructive sleep apnea 02/03/2017  . Acute diastolic heart failure ([place]) 01/11/2017  . Cellulitis 11/30/2016  . Chronic respiratory failure ([place]) 08/12/2015  . 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 Dyspnea 07/22/2015  . Allergic rhinitis 05/02/2015  . Exertional dyspnea   . Symptomatic anemia 04/08/2015  . History of mitral valve replacement with mechanical valve 04/08/2015  . CAP (community acquired pneumonia) 04/08/2015  . CKD (chronic kidney disease) stage 3, GFR 30-59 ml/min (HCC) 04/08/2015  . Atrial flutter, unspecified   . ILD (interstitial lung disease) ([place]) 10/12/2013  . Aortic stenosis 10/02/2013  . Essential hypertension 10/02/2013  . Hyperlipidemia 10/02/2013  . Chronic asthmatic bronchitis ([place]) 08/06/2013  . Abnormal CT scan, chest 08/09/2012    Foley in place to monitor urine output;  Heparin drip restarted.  Hgb has drifted down a bit.  Will follow.  Transfer to reg bed.     3 Days Post-Op    LOS: 4 days   Rosario Adie, MD  [facility] [place] [facility]

## 2018-01-28 NOTE — Progress Notes (Signed)
ANTICOAGULATION CONSULT NOTE - Follow Up Consult  Pharmacy Consult for Heparin Indication: mechanical MVR, mechanical AVR  No Known Allergies  Patient Measurements: Height: 5\' 5"  (165.1 cm) Weight: (bed malfunction/unable to get weight) IBW/kg (Calculated) : 57 Heparin Dosing Weight:   Vital Signs: Temp: 98 F (36.7 C) (08/31 0339) Temp Source: Oral (08/31 0339) BP: 122/50 (08/31 0200) Pulse Rate: 89 (08/31 0200)  Labs: Recent Labs    01/26/18 0303  01/26/18 1753 01/27/18 0105 01/27/18 0333 01/27/18 1932 01/28/18 0217  HGB 8.0*   < > 8.0* 8.4* 8.5*  --  7.8*  HCT 25.2*   < > 24.8* 25.8* 26.2*  --  23.8*  PLT 183  --  164  --  168  --  173  LABPROT 17.4*  --   --   --  17.1*  --  16.7*  INR 1.43  --   --   --  1.41  --  1.37  HEPARINUNFRC  --   --   --   --   --  0.15* 0.28*  CREATININE 3.80*  --  4.47*  --  4.22*  --  3.33*   < > = values in this interval not displayed.    Estimated Creatinine Clearance: 28.3 mL/min (A) (by C-G formula based on SCr of 3.33 mg/dL (H)).   Medications:  Infusions:  . sodium chloride    . sodium chloride 25 mL/hr at 01/28/18 0321  . famotidine (PEPCID) IV    . heparin 1,900 Units/hr (01/28/18 0321)  . sodium chloride      Assessment: Patient with low heparin level.  No heparin issues per RN.  Goal of Therapy:  Heparin level 0.3-0.7 units/ml Monitor platelets by anticoagulation protocol: Yes   Plan:  Increase heparin to 1900 units/hr Recheck level at [address] [place] 01/28/2018,5:08 AM

## 2018-01-28 NOTE — Progress Notes (Signed)
Progress Note  Patient Name: [name] Date of Encounter: 01/28/2018  Primary Cardiologist: Sinclair Grooms, MD    Patient Profile     57 y.o. female with a hx of severe aortic stenosis & moderate mitral stenosis s/p mechanical MVR/mechanical AVR ( mean grad 25)  at [facility] 07/2014 with post-op atrial flutter, no significant CAD prior to that surgery, chronic diastolic CHF, idiopathic pulmonary hemosiderosis,moderate persistent asthma,ILD, polycythemia - requiring phlebotomies, more recent anemia,CKD stage III, HTN, HLD, Bell's palsy, morbid obesity on home O2 with exertionwho came in for gastric sleeve surgery. She was bridged prior to surgery. Postop course complicated by hypoxia and worsening anemia. \\  LVEF  65-67% mod LVH 7/18   Subjective    uses 3L O2 continuously at home.  No sob  Feeling better than yesterday  OOB and in chair  Inpatient Medications    Scheduled Meds: . acetaminophen (TYLENOL) oral liquid 160 mg/5 mL  650 mg Oral Q6H  . gabapentin  200 mg Oral Q12H  . loratadine  10 mg Oral QHS  . mouth rinse  15 mL Mouth Rinse BID  . mometasone-formoterol  2 puff Inhalation BID  . montelukast  10 mg Oral QHS  . protein supplement shake  2 oz Oral Q2H  . Warfarin - Pharmacist Dosing Inpatient   Does not apply q1800   Continuous Infusions: . sodium chloride    . sodium chloride 25 mL/hr at 01/28/18 0321  . famotidine (PEPCID) IV    . heparin 1,900 Units/hr (01/28/18 0321)  . sodium chloride     PRN Meds: [CANCELED] Place/Maintain arterial line **AND** sodium chloride, albuterol, morphine injection, ondansetron (ZOFRAN) IV, oxyCODONE, simethicone   Vital Signs    Vitals:   01/28/18 0200 01/28/18 0339 01/28/18 0400 01/28/18 0600  BP: (!) 122/50  (!) 131/46 (!) 97/56  Pulse: 89  77 81  Resp: 16  16 16   Temp:  98 F (36.7 C)    TempSrc:  Oral    SpO2: 91%  97% 92%  Weight:      Height:        Intake/Output Summary (Last 24 hours) at  01/28/2018 0744 Last data filed at 01/28/2018 0700 Gross per 24 hour  Intake 1642.4 ml  Output 2875 ml  Net -1232.6 ml   Filed Weights   01/24/18 0600 01/26/18 1500  Weight: (!) 152.4 kg (!) 157.9 kg    Telemetry    Sinus with variable rates  Personally Reviewed - Personally Reviewed  ECG    No new EKG  Physical Exam   Well developed and nourished in no acute distress HENT normal Neck supple with JVP-flat Clear Regular rate and rhythm, mechanical S1 and S 2 Abd-soft with active BS No Clubbing cyanosis SCDs in place and R chronic lymphedema Skin-warm and dry A & Oriented  Grossly normal sensory and motor function   Labs    Chemistry Recent Labs  Lab 01/25/18 0206 01/26/18 0303 01/26/18 1753 01/27/18 0333 01/28/18 0217  NA 140 139 136 135 137  K 5.0 5.4* 5.0 5.1 4.9  CL 99 99 96* 95* 98  CO2 30 28 27 26 28   GLUCOSE 178* 135* 126* 118* 111*  BUN 36* 64* 72* 73* 80*  CREATININE 1.48* 3.80* 4.47* 4.22* 3.33*  CALCIUM 9.1 8.9 9.0 8.9 9.3  PROT 7.5 6.8  --   --   --   ALBUMIN 3.9 3.9  --  3.6  --   AST 61*  48*  --   --   --   ALT 41 36  --   --   --   ALKPHOS 51 44  --   --   --   BILITOT 0.6 0.7  --   --   --   GFRNONAA 38* 12* 10* 11* 14*  GFRAA 44* 14* 12* 12* 16*  ANIONGAP 11 12 13 14 11      Hematology Recent Labs  Lab 01/26/18 1753 01/27/18 0105 01/27/18 0333 01/28/18 0217  WBC 16.0*  --  16.1* 11.5*  RBC 2.49*  --  2.68* 2.46*  HGB 8.0* 8.4* 8.5* 7.8*  HCT 24.8* 25.8* 26.2* 23.8*  MCV 99.6  --  97.8 96.7  MCH 32.1  --  31.7 31.7  MCHC 32.3  --  32.4 32.8  RDW 16.2*  --  16.7* 16.4*  PLT 164  --  168 173   INR 1.37   Cardiac EnzymesNo results for input(s): TROPONINI in the last 168 hours. No results for input(s): TROPIPOC in the last 168 hours.   BNPNo results for input(s): BNP, PROBNP in the last 168 hours.   DDimer No results for input(s): DDIMER in the last 168 hours.   Radiology    US Renal  Result Date: 01/26/2018 CLINICAL  DATA:  Acute renal insufficiency.  Morbid obesity. EXAM: RENAL / URINARY TRACT ULTRASOUND COMPLETE COMPARISON:  Abdominal ultrasound 04/12/2017 FINDINGS: Right Kidney: Length: 10.2 cm. Echogenicity within normal limits. No mass or hydronephrosis visualized. Left Kidney: Length: 12.0 cm. Echogenicity within normal limits. No mass or hydronephrosis visualized. Bladder: Poorly visualized.  Urinary Foley catheter in place. Increased echogenicity of the liver. Small volume abdominal ascites. IMPRESSION: Study limited by body habitus. Normal appearance of the kidneys. Increased echogenicity of the liver. Small volume perihepatic abdominal ascites. Electronically Signed   By: Fidela Salisbury M.D.   On: 01/26/2018 19:28    Cardiac Studies   Echo 12/03/2016 LV EF: 65% -   70% Study Conclusions  - Left ventricle: The cavity size was normal. Wall thickness was   increased in a pattern of moderate LVH. Systolic function was   vigorous. The estimated ejection fraction was in the range of 65%   to 70%. - Aortic valve: AV prosthesis is well seated Peak and mean   gradients through the valve atr 47 and 25 mm Hg respectively   These gradients are lower than those reported in echo of March   2018 - Mitral valve: MV prosthesis is diffiuclt to see well Peak and   mean gradients through the valve are 18 and 8 mm Hg respectively.   MVA by P T1/2 is 1.8 cm2 No significant MR. No significant change   in gradients from echo in March 2018. Valve area by pressure   half-time: 1.8 cm^2. - Pulmonary arteries: PA peak pressure: 43 mm Hg (S).   Assessment & Plan      AVR/MVR  S/P gastric sleeve  Anemia  Kidney failure Acute thought 2/2 hypotension  Improving 4.22>>3.33  Respiratory Failure A/C  Bridging back to coumadin on IV heparin  INR 1.37 today  Cr better

## 2018-01-28 NOTE — Progress Notes (Signed)
Condensed Pharmacy Protocol Note - IV heparin  Labs: heparin level 0.34  A/P: no reported bleeding. Heparin level therapeutic (goal 0.3-0.7) on current IV heparin rate of 1900 units/hr. Continue current IV heparin rate. Daily heparin level and CBC  Adrian Saran, PharmD, BCPS Pager [phone] 01/28/2018 6:32 PM

## 2018-01-28 NOTE — Progress Notes (Signed)
ANTICOAGULATION CONSULT NOTE   Pharmacy Consult for Heparin, Warfarin Indication: mechanical MVR, mechanical AVR  No Known Allergies  Patient Measurements: Height: 5\' 5"  (165.1 cm) Weight: (!) 347 lb 7.1 oz (157.6 kg) IBW/kg (Calculated) : 57 Heparin Dosing Weight: 96 kg  Vital Signs: Temp: 97.8 F (36.6 C) (08/31 1114) Temp Source: Oral (08/31 1114) BP: 120/66 (08/31 1114) Pulse Rate: 83 (08/31 1114)  Labs: Recent Labs    01/26/18 0303  01/26/18 1753 01/27/18 0105 01/27/18 0333 01/27/18 1932 01/28/18 0217 01/28/18 1201  HGB 8.0*   < > 8.0* 8.4* 8.5*  --  7.8*  --   HCT 25.2*   < > 24.8* 25.8* 26.2*  --  23.8*  --   PLT 183  --  164  --  168  --  173  --   LABPROT 17.4*  --   --   --  17.1*  --  16.7*  --   INR 1.43  --   --   --  1.41  --  1.37  --   HEPARINUNFRC  --   --   --   --   --  0.15* 0.28* 0.34  CREATININE 3.80*  --  4.47*  --  4.22*  --  3.33*  --    < > = values in this interval not displayed.    Estimated Creatinine Clearance: 28.3 mL/min (A) (by C-G formula based on SCr of 3.33 mg/dL (H)).    Medications:  Scheduled:  . acetaminophen (TYLENOL) oral liquid 160 mg/5 mL  650 mg Oral Q6H  . gabapentin  200 mg Oral Q12H  . loratadine  10 mg Oral QHS  . mometasone-formoterol  2 puff Inhalation BID  . montelukast  10 mg Oral QHS  . protein supplement shake  2 oz Oral Q2H  . Warfarin - Pharmacist Dosing Inpatient   Does not apply q1800   Infusions:  . sodium chloride 25 mL/hr at 01/28/18 0321  . famotidine (PEPCID) IV 20 mg (01/28/18 1023)  . heparin 1,900 Units/hr (01/28/18 0321)  . sodium chloride      Assessment: 16 yoF admitted on 8/27 for bariatric surgery, s/p laparoscopic sleeve gastrectomy.  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  Pharmacy was consulted to resume warfarin dosing and begin bridging with Heparin IV  (OK to bolus) instead of planned Lovenox due to bleeding risk.   PTA Lovenox 150mg  [place] q12h (Started 8/19, last dose on 8/26 at 0730)  PTA Warfarin 7.5 mg daily except 3.75 mg on Wed/Fridays (Last dose on 8/21.  INR subtherapeutic at 2.3 (goal INR 2.5-3.5) on 8/19 at coumadin clinic, but previously therapeutic on 7/31 and 7/10.)  Pre-op Lovenox 40mg  [place] given on 8/27 at 06:30.  Significant Events: 8/28  POD#1 RN reports some bleeding from two of the abdominal incisions. Heparin level 0.94 on 1600 units/hr.  Supratherapeutic level likely related to heparin bolus.  8/29 POD#2 RN reports no further bleeding.  Developed AKI.  Hgb decreased further, continue holding anticoagulation and transfuse PRBC. 8/30 POD#3: OK to resume anticoagulation per Cards and CCS.   Today, 01/28/2018:  Heparin level now therapeutic after dose increase early this AM from 1800 units/hr to 1900 units/hr  SCr peaked yesterday at 4.47, continues to improve now down to 3.33  INR 1.37 as expected after first dose of warfarin post of of 5mg  given yesterday 8/30  CBC: Hgb  7.8 dropped slightly from 8.5.  Plt remain WNL.    No major current drug-drug interactions with warfarin; preoperative aprepitant x 1 on 01/25/18 at [facility]-prophylactic dosage is noted.    Diet: Bariatric liquid diet; protein shakes with goal of 2 fl oz q2h if tolerated.    Warfarin dosing comment:  Published literature describing experiences of patients on chronic warfarin who undergo bariatric surgery demonstrates that on average, many patients require a significant reduction in their weekly warfarin dosage during the early post-operative period.   Mechanism has not been determined with certainty but could be secondary to post-operative alterations in vitamin K intake and absorption.   The weekly warfarin dosage requirement did typically return to pre-surgery levels at approximately 90-180 days post-operatively.    Goal of Therapy:  INR 2-3 Heparin  level 0.3-0.7 units/ml Monitor platelets by anticoagulation protocol: Yes   Plan:  Repeat warfarin 5 mg this PM Continue current IV heparin rate of 1900 units/hr Will recheck heparin level in 6 hours to confirm continued goal heparin level at current IV heparin level Daily INR, heparin level, and CBC  Monitor for s/s bleeding.     Adrian Saran, PharmD, BCPS Pager [phone] 01/28/2018 12:29 PM

## 2018-01-28 NOTE — Progress Notes (Signed)
[facility] Progress Note   Subjective:   Feels overall ok.  Hb down a bit.   Objective Vitals:   01/28/18 0800 01/28/18 0812 01/28/18 1028 01/28/18 1114  BP: 126/61   120/66  Pulse: 81   83  Resp: (!) 21   (!) 24  Temp:  98.1 F (36.7 C)  97.8 F (36.6 C)  TempSrc:    Oral  SpO2: 93%  92% 92%  Weight:    (!) 157.6 kg  Height:       Physical Exam General: obese, comfortable in chair Heart: RRR, mechanical heart sounds Lungs: clear, normal WOB Abdomen: extensive ecchymoses - unchanged from tomorrow Extremities: R lymphedema, L trace ankle edema and ruddy chronic discoloration  Additional Objective Labs: Basic Metabolic Panel: Recent Labs  Lab 01/26/18 1753 01/27/18 0333 01/28/18 0217  NA 136 135 137  K 5.0 5.1 4.9  CL 96* 95* 98  CO2 27 26 28   GLUCOSE 126* 118* 111*  BUN 72* 73* 80*  CREATININE 4.47* 4.22* 3.33*  CALCIUM 9.0 8.9 9.3  PHOS  --  6.2* 5.4*   Liver Function Tests: Recent Labs  Lab 01/25/18 0206 01/26/18 0303 01/27/18 0333  AST 61* 48*  --   ALT 41 36  --   ALKPHOS 51 44  --   BILITOT 0.6 0.7  --   PROT 7.5 6.8  --   ALBUMIN 3.9 3.9 3.6   No results for input(s): LIPASE, AMYLASE in the last 168 hours. CBC: Recent Labs  Lab 01/25/18 0206 01/25/18 0803  01/26/18 0303  01/26/18 1753 01/27/18 0105 01/27/18 0333 01/28/18 0217  WBC 14.3* 19.0*  --  17.8*  --  16.0*  --  16.1* 11.5*  NEUTROABS 12.6*  --   --  13.8*  --   --   --   --   --   HGB 11.4* 9.6*   < > 8.0*   < > 8.0* 8.4* 8.5* 7.8*  HCT 36.1 30.5*   < > 25.2*   < > 24.8* 25.8* 26.2* 23.8*  MCV 100.6* 102.0*  --  102.0*  --  99.6  --  97.8 96.7  PLT 234 246  --  183  --  164  --  168 173   < > = values in this interval not displayed.   Blood Culture    Component Value Date/Time   SDES BLOOD BLOOD RIGHT FOREARM 11/30/2016 2046   SPECREQUEST  11/30/2016 2046    BOTTLES DRAWN AEROBIC AND ANAEROBIC Blood Culture results may not be optimal due to an inadequate  volume of blood received in culture bottles   CULT NO GROWTH 5 DAYS 11/30/2016 2046   REPTSTATUS 12/05/2016 FINAL 11/30/2016 2046    Cardiac Enzymes: No results for input(s): CKTOTAL, CKMB, CKMBINDEX, TROPONINI in the last 168 hours. CBG: Recent Labs  Lab 01/26/18 1542  GLUCAP 122*   Iron Studies: No results for input(s): IRON, TIBC, TRANSFERRIN, FERRITIN in the last 72 hours. @lablastinr3 @ Studies/Results: US Renal  Result Date: 01/26/2018 CLINICAL DATA:  Acute renal insufficiency.  Morbid obesity. EXAM: RENAL / URINARY TRACT ULTRASOUND COMPLETE COMPARISON:  Abdominal ultrasound 04/12/2017 FINDINGS: Right Kidney: Length: 10.2 cm. Echogenicity within normal limits. No mass or hydronephrosis visualized. Left Kidney: Length: 12.0 cm. Echogenicity within normal limits. No mass or hydronephrosis visualized. Bladder: Poorly visualized.  Urinary Foley catheter in place. Increased echogenicity of the liver. Small volume abdominal ascites. IMPRESSION: Study limited by body habitus. Normal appearance of the kidneys.  Increased echogenicity of the liver. Small volume perihepatic abdominal ascites. Electronically Signed   By: Fidela Salisbury M.D.   On: 01/26/2018 19:28   Medications: . sodium chloride 25 mL/hr at 01/28/18 0321  . famotidine (PEPCID) IV Stopped (01/28/18 1053)  . heparin 1,900 Units/hr (01/28/18 0321)  . sodium chloride     . acetaminophen (TYLENOL) oral liquid 160 mg/5 mL  650 mg Oral Q6H  . gabapentin  200 mg Oral Q12H  . loratadine  10 mg Oral QHS  . mometasone-formoterol  2 puff Inhalation BID  . montelukast  10 mg Oral QHS  . protein supplement shake  2 oz Oral Q2H  . warfarin  5 mg Oral ONCE-1800  . 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 Neprhology is following for AKI on CKD.   1.  Acute on CKD:  In setting of acute blood loss and hypotension expect this is ATN and will need to run it's course and it appears her creatinine has peaked and now trending down, 3.33 thos AM.  UOP great yesterday without diuretic (was taking 60/40 at home prior to admission). At this point will work to maintain euvolemia, avoid further nephrotoxic insults.   Renal US was limited due to habitus but overall normal.    2.  Acute on chronic Diastolic heart failure:  Back to home O2 requirement after diuresis yesterday.  Strict I/Os - she has foley catheter in and I'm fine with that coming out if she can collect urine volume for measurement; asked her sister to d/w RN re: foley.  Holding standing diuretic at this time -- she's overall net neg 1.2L for the admission so will not resume diuretic yet.  Cardiology following, agree with cont to hold ARB in face of severe AKI.    3.  Hyperkalemia:  Resolved.  Continue to monitor.    4.  Anemia: acute blood loss - stabilized yesterday but this AM down from 8.5 > 7.8.  Surgery aware and monitoring.  No indication for ESA.   5.  Hypertension, chronic: holding all home medications in face of post op hypotension.  BP remains in the 90-130/40-50s.  If BP trends up continue to hold ARB and MRB until AKI resolves.   Jannifer Hick MD 01/28/2018, 1:00 PM  [facility] Pager: [phone]

## 2018-01-28 NOTE — Progress Notes (Signed)
Ambulated in hallway 200 feet. Became SOB and pale, increase RR, sat in wheel chair and wheeled back to room. Pt got up to recliner after re cooperating from ambulation trial. Tolerated well. Pt removed oxygen in her sleep and sats dropped to 77. Replaced oxygen and sats up to 92. Will continue to monitor. Will not ambulate again tonight Hoyle Barr, RN

## 2018-01-28 NOTE — Progress Notes (Signed)
Heparin gtt currently infusing @ 1900u/hr. IV rate continued per [facility], [facility] recommendations.

## 2018-01-28 NOTE — Progress Notes (Signed)
Pt declined cpap tonight, prefers to wear nasal cannula.  Pt was encouraged to call should she change her mind.

## 2018-01-29 LAB — CBC
HCT: 24.8 % — ABNORMAL LOW (ref 36.0–46.0)
Hemoglobin: 8 g/dL — ABNORMAL LOW (ref 12.0–15.0)
MCH: 32.1 pg (ref 26.0–34.0)
MCHC: 32.3 g/dL (ref 30.0–36.0)
MCV: 99.6 fL (ref 78.0–100.0)
Platelets: 218 10*3/uL (ref 150–400)
RBC: 2.49 MIL/uL — AB (ref 3.87–5.11)
RDW: 16.6 % — AB (ref 11.5–15.5)
WBC: 9.3 10*3/uL (ref 4.0–10.5)

## 2018-01-29 LAB — BASIC METABOLIC PANEL
ANION GAP: 9 (ref 5–15)
BUN: 69 mg/dL — AB (ref 6–20)
CALCIUM: 9.6 mg/dL (ref 8.9–10.3)
CO2: 26 mmol/L (ref 22–32)
CREATININE: 1.77 mg/dL — AB (ref 0.44–1.00)
Chloride: 107 mmol/L (ref 98–111)
GFR calc Af Amer: 35 mL/min — ABNORMAL LOW (ref >60)
GFR, EST NON AFRICAN AMERICAN: 31 mL/min — AB (ref >60)
GLUCOSE: 93 mg/dL (ref 70–99)
Potassium: 5.2 mmol/L — ABNORMAL HIGH (ref 3.5–5.1)
Sodium: 142 mmol/L (ref 135–145)

## 2018-01-29 LAB — PROTIME-INR
INR: 1.29
PROTHROMBIN TIME: 15.9 s — AB (ref 11.4–15.2)

## 2018-01-29 LAB — HEPARIN LEVEL (UNFRACTIONATED): HEPARIN UNFRACTIONATED: 0.38 [IU]/mL (ref 0.30–0.70)

## 2018-01-29 MED ORDER — WARFARIN SODIUM 6 MG PO TABS
6.0000 mg | ORAL_TABLET | Freq: Once | ORAL | Status: AC
  Administered 2018-01-29: 6 mg via ORAL
  Filled 2018-01-29: qty 1

## 2018-01-29 NOTE — Plan of Care (Signed)
  Problem: Clinical Measurements: Goal: Ability to maintain clinical measurements within normal limits will improve Outcome: Progressing   Problem: Clinical Measurements: Goal: Respiratory complications will improve Outcome: Progressing   Problem: Clinical Measurements: Goal: Cardiovascular complication will be avoided Outcome: Progressing   Problem: Activity: Goal: Risk for activity intolerance will decrease Outcome: Progressing

## 2018-01-29 NOTE — Progress Notes (Signed)
Progress Note  Patient Name: [name] Date of Encounter: 01/29/2018  Primary Cardiologist: Sinclair Grooms, MD    Patient Profile     57 y.o. female with a hx of severe aortic stenosis & moderate mitral stenosis s/p mechanical MVR/mechanical AVR ( mean grad 25)  at [facility] 07/2014 with post-op atrial flutter, no significant CAD prior to that surgery, chronic diastolic CHF, idiopathic pulmonary hemosiderosis,moderate persistent asthma,ILD, polycythemia - requiring phlebotomies, more recent anemia,CKD stage III, HTN, HLD, Bell's palsy, morbid obesity on home O2 with exertionwho came in for gastric sleeve surgery. She was bridged prior to surgery. Postop course complicated by hypoxia and worsening anemia. \\ Now being bridged back to warfarin  LVEF  65-67% mod LVH 7/18   Subjective   Without sob or chest pain, ambulated yesterday   Inpatient Medications    Scheduled Meds: . acetaminophen (TYLENOL) oral liquid 160 mg/5 mL  650 mg Oral Q6H  . gabapentin  200 mg Oral Q12H  . loratadine  10 mg Oral QHS  . mometasone-formoterol  2 puff Inhalation BID  . montelukast  10 mg Oral QHS  . protein supplement shake  2 oz Oral Q2H  . warfarin  6 mg Oral ONCE-1800  . Warfarin - Pharmacist Dosing Inpatient   Does not apply q1800   Continuous Infusions: . sodium chloride Stopped (01/28/18 1023)  . famotidine (PEPCID) IV Stopped (01/28/18 1028)  . heparin 1,900 Units/hr (01/29/18 0746)  . sodium chloride     PRN Meds: albuterol, morphine injection, ondansetron (ZOFRAN) IV, oxyCODONE, simethicone   Vital Signs    Vitals:   01/28/18 1344 01/28/18 2034 01/28/18 2207 01/29/18 0639  BP: (!) 126/59  127/62 (!) 128/57  Pulse: 87  79 78  Resp: (!) 22  18 18   Temp: 98.2 F (36.8 C)  98.2 F (36.8 C) 98 F (36.7 C)  TempSrc: Oral  Oral Oral  SpO2: 93% 93% 98% 95%  Weight:    (!) 157.7 kg  Height:        Intake/Output Summary (Last 24 hours) at 01/29/2018 0752 Last data filed  at 01/29/2018 0700 Gross per 24 hour  Intake 1306.9 ml  Output 4025 ml  Net -2718.1 ml   Filed Weights   01/26/18 1500 01/28/18 1114 01/29/18 0639  Weight: (!) 157.9 kg (!) 157.6 kg (!) 157.7 kg    Telemetry    NA  ECG    No new EKG  Physical Exam   Well developed and nourished in no acute distress HENT normal Neck supple with JVP-flat Clear Regular rate and rhythm, mechanical S1 and S2   Abd-soft with active BS No Clubbing cyanosis asymmetric edema Skin-warm and dry A & Oriented  Grossly normal sensory and motor function    Labs    Chemistry Recent Labs  Lab 01/25/18 0206 01/26/18 0303 01/26/18 1753 01/27/18 0333 01/28/18 0217  NA 140 139 136 135 137  K 5.0 5.4* 5.0 5.1 4.9  CL 99 99 96* 95* 98  CO2 30 28 27 26 28   GLUCOSE 178* 135* 126* 118* 111*  BUN 36* 64* 72* 73* 80*  CREATININE 1.48* 3.80* 4.47* 4.22* 3.33*  CALCIUM 9.1 8.9 9.0 8.9 9.3  PROT 7.5 6.8  --   --   --   ALBUMIN 3.9 3.9  --  3.6  --   AST 61* 48*  --   --   --   ALT 41 36  --   --   --  ALKPHOS 51 44  --   --   --   BILITOT 0.6 0.7  --   --   --   GFRNONAA 38* 12* 10* 11* 14*  GFRAA 44* 14* 12* 12* 16*  ANIONGAP 11 12 13 14 11      Hematology Recent Labs  Lab 01/27/18 0333 01/28/18 0217 01/29/18 0455  WBC 16.1* 11.5* 9.3  RBC 2.68* 2.46* 2.49*  HGB 8.5* 7.8* 8.0*  HCT 26.2* 23.8* 24.8*  MCV 97.8 96.7 99.6  MCH 31.7 31.7 32.1  MCHC 32.4 32.8 32.3  RDW 16.7* 16.4* 16.6*  PLT 168 173 218   INR 1.37   Cardiac EnzymesNo results for input(s): TROPONINI in the last 168 hours. No results for input(s): TROPIPOC in the last 168 hours.   BNPNo results for input(s): BNP, PROBNP in the last 168 hours.   DDimer No results for input(s): DDIMER in the last 168 hours.   Radiology    No results found.  Cardiac Studies   Echo 12/03/2016 LV EF: 65% -   70% Study Conclusions  - Left ventricle: The cavity size was normal. Wall thickness was   increased in a pattern of moderate  LVH. Systolic function was   vigorous. The estimated ejection fraction was in the range of 65%   to 70%. - Aortic valve: AV prosthesis is well seated Peak and mean   gradients through the valve atr 47 and 25 mm Hg respectively   These gradients are lower than those reported in echo of March   2018 - Mitral valve: MV prosthesis is diffiuclt to see well Peak and   mean gradients through the valve are 18 and 8 mm Hg respectively.   MVA by P T1/2 is 1.8 cm2 No significant MR. No significant change   in gradients from echo in March 2018. Valve area by pressure   half-time: 1.8 cm^2. - Pulmonary arteries: PA peak pressure: 43 mm Hg (S).   Assessment & Plan      AVR/MVR  S/P gastric sleeve  Anemia  Kidney failure Acute thought 2/2 hypotension  Improving 4.22>>3.33  Respiratory Failure A/C  Bridging back to coumadin on IV heparin    INR 1.37>>1.29 Hgb stable  No Cr available today  Will sign off call for questions  [facility] will sign off.   Medication Recommendations:  anticoagulation Other recommendations (labs, testing, etc):  Will alert coumadin clinic to reschedule from 9/3 to week of 9/9 Follow up as an outpatient:  Scheduled 10/1

## 2018-01-29 NOTE — Progress Notes (Signed)
Patient ID: [name], female   DOB: 04/12/1961, 57 y.o.   MRN: [id number] [facility] Surgery Progress Note:   5 Days Post-Op  Subjective: Mental status is clear.   Objective: Vital signs in last 24 hours: Temp:  [97.8 F (36.6 C)-98.2 F (36.8 C)] 98 F (36.7 C) (09/01 0639) Pulse Rate:  [78-87] 78 (09/01 0639) Resp:  [18-24] 18 (09/01 0639) BP: (120-128)/(57-66) 128/57 (09/01 0639) SpO2:  [92 %-98 %] 95 % (09/01 0639) Weight:  [157.6 kg-157.7 kg] 157.7 kg (09/01 0639)  Intake/Output from previous day: 08/31 0701 - 09/01 0700 In: 1306.9 [P.O.:540; I.V.:722.9; IV Piggyback:44] Out: 3335 [Urine:4025] Intake/Output this shift: No intake/output data recorded.  Physical Exam: Work of breathing is not labored.  No complaints.  Taking protein shakes.  Anticipated bruising.    Lab Results:  Results for orders placed or performed during the hospital encounter of 01/24/18 (from the past 48 hour(s))  Heparin level (unfractionated)     Status: Abnormal   Collection Time: 01/27/18  7:32 PM  Result Value Ref Range   Heparin Unfractionated 0.15 (L) 0.30 - 0.70 IU/mL    Comment: (NOTE) If heparin results are below expected values, and patient dosage has  been confirmed, suggest follow up testing of antithrombin III levels. Performed at [facility], [place] [address], [place], [place] [address]   CBC     Status: Abnormal   Collection Time: 01/28/18  2:17 AM  Result Value Ref Range   WBC 11.5 (H) 4.0 - 10.5 K/uL   RBC 2.46 (L) 3.87 - 5.11 MIL/uL   Hemoglobin 7.8 (L) 12.0 - 15.0 g/dL   HCT 23.8 (L) 36.0 - 46.0 %   MCV 96.7 78.0 - 100.0 fL   MCH 31.7 26.0 - 34.0 pg   MCHC 32.8 30.0 - 36.0 g/dL   RDW 16.4 (H) 11.5 - 15.5 %   Platelets 173 150 - 400 K/uL    Comment: Performed at [facility], [place] [address] [place], [place] [address]  Protime-INR     Status: Abnormal   Collection Time: 01/28/18  2:17 AM  Result Value Ref Range    Prothrombin Time 16.7 (H) 11.4 - 15.2 seconds   INR 1.37     Comment: Performed at [facility], [place] [address], [place], [place] [address]  Basic metabolic panel     Status: Abnormal   Collection Time: 01/28/18  2:17 AM  Result Value Ref Range   Sodium 137 135 - 145 mmol/L   Potassium 4.9 3.5 - 5.1 mmol/L   Chloride 98 98 - 111 mmol/L   CO2 28 22 - 32 mmol/L   Glucose, Bld 111 (H) 70 - 99 mg/dL   BUN 80 (H) 6 - 20 mg/dL   Creatinine, Ser 3.33 (H) 0.44 - 1.00 mg/dL   Calcium 9.3 8.9 - 10.3 mg/dL   GFR calc non Af Amer 14 (L) >60 mL/min   GFR calc Af Amer 16 (L) >60 mL/min    Comment: (NOTE) The eGFR has been calculated using the CKD EPI equation. This calculation has not been validated in all clinical situations. eGFR's persistently <60 mL/min signify possible Chronic Kidney Disease.    Anion gap 11 5 - 15    Comment: Performed at [facility], [place] [address] [place], [place] [address]  Magnesium     Status: None   Collection Time: 01/28/18  2:17 AM  Result Value Ref Range   Magnesium  2.4 1.7 - 2.4 mg/dL    Comment: Performed at [facility], [place] [address] [place], [place] [address]  Phosphorus     Status: Abnormal   Collection Time: 01/28/18  2:17 AM  Result Value Ref Range   Phosphorus 5.4 (H) 2.5 - 4.6 mg/dL    Comment: Performed at [facility], [place] [address] [place], [place] [address]  Heparin level (unfractionated)     Status: Abnormal   Collection Time: 01/28/18  2:17 AM  Result Value Ref Range   Heparin Unfractionated 0.28 (L) 0.30 - 0.70 IU/mL    Comment: (NOTE) If heparin results are below expected values, and patient dosage has  been confirmed, suggest follow up testing of antithrombin III levels. Performed at [facility], [place] [address], [place], [place] [address]   Heparin level (unfractionated)     Status: None   Collection Time: 01/28/18 12:01 PM  Result  Value Ref Range   Heparin Unfractionated 0.34 0.30 - 0.70 IU/mL    Comment: (NOTE) If heparin results are below expected values, and patient dosage has  been confirmed, suggest follow up testing of antithrombin III levels. Performed at [facility], [place] [address], [place], [place] [address]   Heparin level (unfractionated)     Status: None   Collection Time: 01/28/18  6:05 PM  Result Value Ref Range   Heparin Unfractionated 0.32 0.30 - 0.70 IU/mL    Comment: (NOTE) If heparin results are below expected values, and patient dosage has  been confirmed, suggest follow up testing of antithrombin III levels. Performed at [facility], [place] [address], [place], [place] [address]   CBC     Status: Abnormal   Collection Time: 01/29/18  4:55 AM  Result Value Ref Range   WBC 9.3 4.0 - 10.5 K/uL   RBC 2.49 (L) 3.87 - 5.11 MIL/uL   Hemoglobin 8.0 (L) 12.0 - 15.0 g/dL   HCT 24.8 (L) 36.0 - 46.0 %   MCV 99.6 78.0 - 100.0 fL   MCH 32.1 26.0 - 34.0 pg   MCHC 32.3 30.0 - 36.0 g/dL   RDW 16.6 (H) 11.5 - 15.5 %   Platelets 218 150 - 400 K/uL    Comment: Performed at [facility], [place] [address], [place], [place] [address]  Protime-INR     Status: Abnormal   Collection Time: 01/29/18  4:55 AM  Result Value Ref Range   Prothrombin Time 15.9 (H) 11.4 - 15.2 seconds   INR 1.29     Comment: Performed at [facility], [place] [address], [place], [place] [address]  Heparin level (unfractionated)     Status: None   Collection Time: 01/29/18  4:55 AM  Result Value Ref Range   Heparin Unfractionated 0.38 0.30 - 0.70 IU/mL    Comment: (NOTE) If heparin results are below expected values, and patient dosage has  been confirmed, suggest follow up testing of antithrombin III levels. Performed at [facility], [place] [address], [place], [place] [address]     Radiology/Results: No results  found.  Anti-infectives: Anti-infectives (From admission, onward)   Start     Dose/Rate Route Frequency Ordered Stop   01/24/18 0615  cefoTEtan (CEFOTAN) 2 g in sodium chloride 0.9 % 100 mL IVPB     2 g 200 mL/hr over 30 Minutes Intravenous On call to O.R. 01/24/18 0606 01/24/18 0753      Assessment/Plan: Problem List:  Patient Active Problem List   Diagnosis Date Noted  . Acute renal failure superimposed on stage 3 chronic kidney disease ([place]) 01/26/2018  . PAF (paroxysmal atrial fibrillation) ([place])   . Chronic anticoagulation   . Acute respiratory failure ([place])   . Hypoxia   . Postprocedural hypotension   . Chronic acquired lymphedema - Right lower extremity 01/24/2018  . Hx of mechanical aortic valve replacement 01/24/2018  . Morbid obesity ([place]) 01/24/2018  . BMI 50.0-59.9, adult ([place]) 10/12/2017  . Right-sided Bell's palsy 02/07/2017  . Obstructive sleep apnea 02/03/2017  . Acute diastolic heart failure ([place]) 01/11/2017  . Cellulitis 11/30/2016  . Chronic respiratory failure ([place]) 08/12/2015  . 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 Dyspnea 07/22/2015  . Allergic rhinitis 05/02/2015  . Exertional dyspnea   . Symptomatic anemia 04/08/2015  . History of mitral valve replacement with mechanical valve 04/08/2015  . CAP (community acquired pneumonia) 04/08/2015  . CKD (chronic kidney disease) stage 3, GFR 30-59 ml/min (HCC) 04/08/2015  . Atrial flutter, unspecified   . ILD (interstitial lung disease) ([place]) 10/12/2013  . Aortic stenosis 10/02/2013  . Essential hypertension 10/02/2013  . Hyperlipidemia 10/02/2013  . Chronic asthmatic bronchitis ([place]) 08/06/2013  . Abnormal CT scan, chest 08/09/2012    Will discontinue Foley.  Has had good urine output.  Will check BMET in am.  Oral anticoagulation in progress and Hg stable on heparin drip.  5 Days Post-Op    LOS: 5 days   Matt B. Hassell Done, MD, [facility] [phone] beeper [phone]  01/29/2018  8:27 AM

## 2018-01-29 NOTE — Progress Notes (Signed)
ANTICOAGULATION CONSULT NOTE   Pharmacy Consult for Heparin, Warfarin Indication: mechanical MVR, mechanical AVR  No Known Allergies  Patient Measurements: Height: 5\' 5"  (165.1 cm) Weight: (!) 347 lb 10.7 oz (157.7 kg) IBW/kg (Calculated) : 57 Heparin Dosing Weight: 96 kg  Vital Signs: Temp: 98 F (36.7 C) (09/01 0639) Temp Source: Oral (09/01 0639) BP: 128/57 (09/01 0639) Pulse Rate: 78 (09/01 0639)  Labs: Recent Labs    01/26/18 1753  01/27/18 0333  01/28/18 0217 01/28/18 1201 01/28/18 1805 01/29/18 0455  HGB 8.0*   < > 8.5*  --  7.8*  --   --  8.0*  HCT 24.8*   < > 26.2*  --  23.8*  --   --  24.8*  PLT 164  --  168  --  173  --   --  218  LABPROT  --   --  17.1*  --  16.7*  --   --  15.9*  INR  --   --  1.41  --  1.37  --   --  1.29  HEPARINUNFRC  --   --   --    < > 0.28* 0.34 0.32 0.38  CREATININE 4.47*  --  4.22*  --  3.33*  --   --   --    < > = values in this interval not displayed.    Estimated Creatinine Clearance: 28.3 mL/min (A) (by C-G formula based on SCr of 3.33 mg/dL (H)).    Medications:  Scheduled:  . acetaminophen (TYLENOL) oral liquid 160 mg/5 mL  650 mg Oral Q6H  . gabapentin  200 mg Oral Q12H  . loratadine  10 mg Oral QHS  . mometasone-formoterol  2 puff Inhalation BID  . montelukast  10 mg Oral QHS  . protein supplement shake  2 oz Oral Q2H  . Warfarin - Pharmacist Dosing Inpatient   Does not apply q1800   Infusions:  . sodium chloride Stopped (01/28/18 1023)  . famotidine (PEPCID) IV Stopped (01/28/18 1028)  . heparin 1,900 Units/hr (01/28/18 1901)  . sodium chloride      Assessment: 36 yoF admitted on 8/27 for bariatric surgery, s/p laparoscopic sleeve gastrectomy.  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  Pharmacy was consulted to resume warfarin dosing and begin bridging with Heparin IV (OK to bolus)  instead of planned Lovenox due to bleeding risk.   PTA Lovenox 150mg  [place] q12h (Started 8/19, last dose on 8/26 at 0730)  PTA Warfarin 7.5 mg daily except 3.75 mg on Wed/Fridays (Last dose on 8/21.  INR subtherapeutic at 2.3 (goal INR 2.5-3.5) on 8/19 at coumadin clinic, but previously therapeutic on 7/31 and 7/10.)  Pre-op Lovenox 40mg  [place] given on 8/27 at 06:30.  Significant Events: 8/28  POD#1 RN reports some bleeding from two of the abdominal incisions. Heparin level 0.94 on 1600 units/hr.  Supratherapeutic level likely related to heparin bolus.  8/29 POD#2 RN reports no further bleeding.  Developed AKI.  Hgb decreased further, continue holding anticoagulation and transfuse PRBC. 8/30 POD#3: OK to resume anticoagulation per Cards and CCS.   Today, 01/29/2018:  Heparin level 0.38, remains therapeutic on Heparin at 1900 units/hr  INR 1.29, subtherapeutic   Warfarin 7.5 mg x1 on 8/27, held 8/28 and 8/29 for suspected bleeding, then resumed warfarin 5 mg on 8/30 and 8/31.     SCr peaked 8/29 at 4.47, continues  to improve down to 3.33 (8/31)  CBC: Hgb stable at 8, Plt remain WNL.     No bleeding or complications reported  No major current drug-drug interactions with warfarin; preoperative aprepitant x 1 on 01/25/18 at [facility]-prophylactic dosage is noted.    Diet: Bariatric liquid diet; protein shakes with goal of 2 fl oz q2h if tolerated.    Warfarin dosing comment:  Published literature describing experiences of patients on chronic warfarin who undergo bariatric surgery demonstrates that on average, many patients require a significant reduction in their weekly warfarin dosage during the early post-operative period.   Mechanism has not been determined with certainty but could be secondary to post-operative alterations in vitamin K intake and absorption.   The weekly warfarin dosage requirement did typically return to pre-surgery levels at approximately 90-180 days post-operatively.   Goal  of Therapy:  INR 2-3 Heparin level 0.3-0.7 units/ml Monitor platelets by anticoagulation protocol: Yes   Plan:  Warfarin 6 mg x1 dose at 18:00  Continue current IV heparin rate of 1900 units/hr Daily INR, heparin level, and CBC  Monitor for s/s bleeding.     Gretta Arab PharmD, BCPS Pager [phone] 01/29/2018 7:39 AM

## 2018-01-29 NOTE — Progress Notes (Signed)
[facility] Follow Up  Patient seen yesterday - overall improving.  Per chart appears she is doing well still.  Via phone call today confirmed this.   Reviewed chart this AM.  VSS BPs in the 120/50-60s. Cr improved to 1.77 and UOP robust -- 4L yesterday, expect this is a post ATN diuresis.    Recommend:   -- Continue to hold antiHTN therapy with normotension.  Was on losartan 50, lasix 60/40, metoprolol 50 BID at home.  OK from a renal perspective to resume ARB if needed for HTN at this point, but does not need.  --Continue to hold standing diuretics for now.  --Depending on PO intake post op with her gastric sleeve I think her diuretic dose may need to be less than it was prior.  Would adjust as needed -- prior dose was lasix 60qam/40qpm.  --She already has f/u in nephrology clinic in a few months and I think that interval is OK.    I conveyed this information to [name] via telephone today and she was happy to hear kidneys nearly back to normal.    Will sign off.  Please call us anytime with any concerns, questions, clinical changes.    Jannifer Hick MD [facility] Pager [phone]

## 2018-01-30 LAB — CBC
HCT: 27.8 % — ABNORMAL LOW (ref 36.0–46.0)
Hemoglobin: 8.6 g/dL — ABNORMAL LOW (ref 12.0–15.0)
MCH: 31.4 pg (ref 26.0–34.0)
MCHC: 30.9 g/dL (ref 30.0–36.0)
MCV: 101.5 fL — AB (ref 78.0–100.0)
Platelets: 269 10*3/uL (ref 150–400)
RBC: 2.74 MIL/uL — ABNORMAL LOW (ref 3.87–5.11)
RDW: 16.5 % — AB (ref 11.5–15.5)
WBC: 9.7 10*3/uL (ref 4.0–10.5)

## 2018-01-30 LAB — PROTIME-INR
INR: 1.26
Prothrombin Time: 15.7 seconds — ABNORMAL HIGH (ref 11.4–15.2)

## 2018-01-30 LAB — BASIC METABOLIC PANEL
ANION GAP: 9 (ref 5–15)
BUN: 57 mg/dL — ABNORMAL HIGH (ref 6–20)
CALCIUM: 9.6 mg/dL (ref 8.9–10.3)
CO2: 27 mmol/L (ref 22–32)
CREATININE: 1.46 mg/dL — AB (ref 0.44–1.00)
Chloride: 106 mmol/L (ref 98–111)
GFR calc Af Amer: 45 mL/min — ABNORMAL LOW (ref >60)
GFR, EST NON AFRICAN AMERICAN: 39 mL/min — AB (ref >60)
GLUCOSE: 96 mg/dL (ref 70–99)
Potassium: 5.1 mmol/L (ref 3.5–5.1)
Sodium: 142 mmol/L (ref 135–145)

## 2018-01-30 LAB — HEPARIN LEVEL (UNFRACTIONATED): HEPARIN UNFRACTIONATED: 0.33 [IU]/mL (ref 0.30–0.70)

## 2018-01-30 MED ORDER — WARFARIN SODIUM 5 MG PO TABS
7.5000 mg | ORAL_TABLET | Freq: Once | ORAL | Status: AC
  Administered 2018-01-30: 7.5 mg via ORAL
  Filled 2018-01-30: qty 1

## 2018-01-30 NOTE — Progress Notes (Signed)
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, chronic respiratory failure on 3LO2 oxygen, ILD, lymphedema R LE and admitted for gastric sleeve operation and hiatal hernia repair on 8/27 with post op anemia, hypotension and increased oxygen requirements   OT comments  Pt declines any DME for ADL's   Follow Up Recommendations  Supervision/Assistance - 24 hour    Equipment Recommendations  None recommended by OT(to be further assessed)    Recommendations for Other Services      Precautions / Restrictions Precautions Precautions: None Precaution Comments: chronic 3L O2 [place] Restrictions Weight Bearing Restrictions: No       Mobility Bed Mobility               General bed mobility comments: pt in chair  Transfers Overall transfer level: Needs assistance Equipment used: None Transfers: Sit to/from Stand           General transfer comment: Min guard for safety, increased time to perform.         ADL either performed or assessed with clinical judgement   ADL Overall ADL's : Needs assistance/impaired                 Upper Body Dressing : Supervision/safety;Sitting   Lower Body Dressing: Moderate assistance;Sit to/from stand;Cueing for safety Lower Body Dressing Details (indicate cue type and reason): Pt not interested in AE. States she will be able to perform or family will A               General ADL Comments: Sister will A.  Pt does not want a 3 n 1 or any tub DME.  Explained benefits of both and pt declined need     Vision Patient Visual Report: No change from baseline            Cognition Arousal/Alertness: Awake/alert Behavior During Therapy: WFL for tasks assessed/performed Overall Cognitive Status: Within Functional Limits for tasks  assessed                                                     Pertinent Vitals/ Pain       Pain Assessment: No/denies pain         Frequency  Min 2X/week        Progress Toward Goals  OT Goals(current goals can now be found in the care plan section)  Progress towards OT goals: Progressing toward goals     Plan Discharge plan remains appropriate       AM-PAC PT "6 Clicks" Daily Activity     Outcome Measure   Help from another person eating meals?: None Help from another person taking care of personal grooming?: A Little Help from another person toileting, which includes using toliet, bedpan, or urinal?: A Little Help from another person bathing (including washing, rinsing, drying)?: A Little Help from another person to put on and taking off regular upper body clothing?: A Little Help from another person to put on and taking off regular lower body clothing?: A Little 6 Click Score: 19    End of Session    OT Visit Diagnosis: Muscle weakness (generalized) (M62.81)   Activity Tolerance Patient tolerated treatment  well   Patient Left with call bell/phone within reach;in chair;with nursing/sitter in room   Nurse Communication Mobility status        Time: 0102-7253 OT Time Calculation (min): 12 min  Charges: OT General Charges $OT Visit: 1 Visit OT Treatments $Self Care/Home Management : 8-22 mins  [place], [place]   Betsy Pries 01/30/2018, 7:22 PM

## 2018-01-30 NOTE — Progress Notes (Signed)
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 Pt with O2sats ranging from 81-94% on 4LO2 via [place] today. Pt ambulated 81 ft with 2 standing rest breaks, and pt's sister at bedside states that pt ambulates much shorter distances than this at one time while in the home. Pt and pt's family are hoping that pt's endurance for activity will increase for return home. Progress mobility as tolerated.    Follow Up Recommendations  No PT follow up;Supervision for mobility/OOB     Equipment Recommendations  None recommended by PT    Recommendations for Other Services       Precautions / Restrictions Precautions Precautions: None Precaution Comments: chronic 3L O2 [place] Restrictions Weight Bearing Restrictions: No    Mobility  Bed Mobility               General bed mobility comments: Pt up in chair upon PT arrival to room. While preparing herself for mobility, pt with O2sats to 81% on 4LO2 via [place], recovered to 94% with deep breathing in through nose.   Transfers Overall transfer level: Needs assistance Equipment used: None Transfers: Sit to/from Stand Sit to Stand: Min guard         General transfer comment: Min guard for safety, increased time to perform. Used IV pole for steadying upon standing.   Ambulation/Gait Ambulation/Gait assistance: Min guard Gait Distance (Feet): 70 Feet Assistive device: IV Pole Gait  Pattern/deviations: Step-through pattern;Decreased stride length;Wide base of support Gait velocity: slightly decr   General Gait Details: Min guard for safety. Pt pushed IV pole for external support but did not require it. Pt required 2 standing rest breaks, once after 40 ft ambulation and once after 65 ft ambulation. Pt initiated deep inspiration through nose when feeling winded.    Stairs             Wheelchair Mobility    Modified Rankin (Stroke Patients Only)       Balance                                            Cognition Arousal/Alertness: Awake/alert Behavior During Therapy: WFL for tasks assessed/performed Overall Cognitive Status: Within Functional Limits for tasks assessed                                        Exercises      General Comments General comments (skin integrity, edema, etc.): HR into low 120s after ambulation, O2sats 81-94% during session today on 4LO2 via [place].       Pertinent Vitals/Pain Pain Assessment: No/denies pain    Home Living                      Prior Function  PT Goals (current goals can now be found in the care plan section) Acute Rehab PT Goals PT Goal Formulation: With patient Time For Goal Achievement: 02/03/18 Potential to Achieve Goals: Good Progress towards PT goals: Progressing toward goals    Frequency    Min 3X/week      PT Plan Current plan remains appropriate    Co-evaluation              AM-PAC PT "6 Clicks" Daily Activity  Outcome Measure  Difficulty turning over in bed (including adjusting bedclothes, sheets and blankets)?: A Little Difficulty moving from lying on back to sitting on the side of the bed? : Unable Difficulty sitting down on and standing up from a chair with arms (e.g., wheelchair, bedside commode, etc,.)?: Unable Help needed moving to and from a bed to chair (including a wheelchair)?: A Little Help needed walking in  hospital room?: A Little Help needed climbing 3-5 steps with a railing? : A Lot 6 Click Score: 13    End of Session Equipment Utilized During Treatment: Oxygen Activity Tolerance: Patient limited by fatigue Patient left: in chair;with call bell/phone within reach;with family/visitor present Nurse Communication: Mobility status PT Visit Diagnosis: Difficulty in walking, not elsewhere classified (R26.2);Muscle weakness (generalized) (M62.81)     Time: 5038-8828 PT Time Calculation (min) (ACUTE ONLY): 19 min  Charges:  $Gait Training: 8-22 mins                    Errol Ala Conception Chancy, PT, DPT  Pager # [phone]    Olivea Sonnen D Treanna Dumler 01/30/2018, 2:55 PM

## 2018-01-30 NOTE — Progress Notes (Signed)
[place] for Heparin, Warfarin Indication: mechanical MVR, mechanical AVR  No Known Allergies  Patient Measurements: Height: 5\' 5"  (165.1 cm) Weight: (!) 340 lb 8 oz (154.4 kg) IBW/kg (Calculated) : 57 Heparin Dosing Weight: 96 kg  Vital Signs: Temp: 98.2 F (36.8 C) (09/02 0500) Temp Source: Oral (09/02 0500) BP: 137/62 (09/02 0500) Pulse Rate: 79 (09/02 0500)  Labs: Recent Labs    01/28/18 0217  01/28/18 1805 01/29/18 0455 01/29/18 0853 01/30/18 0502  HGB 7.8*  --   --  8.0*  --  8.6*  HCT 23.8*  --   --  24.8*  --  27.8*  PLT 173  --   --  218  --  269  LABPROT 16.7*  --   --  15.9*  --  15.7*  INR 1.37  --   --  1.29  --  1.26  HEPARINUNFRC 0.28*   < > 0.32 0.38  --  0.33  CREATININE 3.33*  --   --   --  1.77* 1.46*   < > = values in this interval not displayed.    Estimated Creatinine Clearance: 63.7 mL/min (A) (by C-G formula based on SCr of 1.46 mg/dL (H)).    Medications:  Scheduled:  . acetaminophen (TYLENOL) oral liquid 160 mg/5 mL  650 mg Oral Q6H  . gabapentin  200 mg Oral Q12H  . loratadine  10 mg Oral QHS  . mometasone-formoterol  2 puff Inhalation BID  . montelukast  10 mg Oral QHS  . protein supplement shake  2 oz Oral Q2H  . Warfarin - Pharmacist Dosing Inpatient   Does not apply q1800   Infusions:  . sodium chloride Stopped (01/28/18 1023)  . famotidine (PEPCID) IV Stopped (01/29/18 0930)  . heparin 1,900 Units/hr (01/29/18 2150)  . sodium chloride      Assessment: 67 yoF admitted on 8/27 for bariatric surgery, s/p laparoscopic sleeve gastrectomy.  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  Pharmacy was consulted to resume warfarin dosing and begin bridging with Heparin IV (OK to bolus) instead of planned Lovenox due to bleeding risk.   PTA Lovenox 150mg  [place] q12h (Started  8/19, last dose on 8/26 at 0730)  PTA Warfarin 7.5 mg daily except 3.75 mg on Wed/Fridays (Last dose on 8/21.  INR subtherapeutic at 2.3 (goal INR 2.5-3.5) on 8/19 at coumadin clinic, but previously therapeutic on 7/31 and 7/10.)  Pre-op Lovenox 40mg  [place] given on 8/27 at 06:30.  Significant Events: 8/28  POD#1 RN reports some bleeding from two of the abdominal incisions. Heparin level 0.94 on 1600 units/hr.  Supratherapeutic level likely related to heparin bolus.  8/29 POD#2 RN reports no further bleeding.  Developed AKI.  Hgb decreased further, continue holding anticoagulation and transfuse PRBC. 8/30 POD#3: OK to resume anticoagulation per Cards and CCS.   Today, 01/30/2018:  Heparin level remains therapeutic on Heparin at 1900 units/hr  INR continues to be subtherapeutic and not responding as fast as would like  Warfarin 7.5 mg x1 on 8/27, held 8/28 and 8/29 for suspected bleeding, then resumed warfarin and have received doses of 5mg , 5mg  and 6mg .     SCr peaked 8/29 at 4.47, continues to improve down to 1.46  CBC: Hgb stable,Plt remain WNL.     No bleeding or complications reported  No major current drug-drug interactions with warfarin; preoperative aprepitant  x 1 on 01/25/18 at [facility]-prophylactic dosage is noted.    Diet: Bariatric liquid diet; protein shakes with goal of 2 fl oz q2h if tolerated.    Warfarin dosing comment:  Published literature describing experiences of patients on chronic warfarin who undergo bariatric surgery demonstrates that on average, many patients require a significant reduction in their weekly warfarin dosage during the early post-operative period.   Mechanism has not been determined with certainty but could be secondary to post-operative alterations in vitamin K intake and absorption.   The weekly warfarin dosage requirement did typically return to pre-surgery levels at approximately 90-180 days post-operatively.   Goal of Therapy:  INR 2-3 Heparin  level 0.3-0.7 units/ml Monitor platelets by anticoagulation protocol: Yes   Plan:  Boost warfarin to 7.5mg  today - give at 1400 rather than normal 1800 Continue current IV heparin rate of 1900 units/hr Daily INR, heparin level, and CBC  Monitor for s/s bleeding.     Adrian Saran, PharmD, BCPS Pager [phone] 01/30/2018 9:28 AM

## 2018-01-30 NOTE — Progress Notes (Signed)
Patient ID: [name], female   DOB: Feb 27, 1961, 57 y.o.   MRN: [id number] [facility] Surgery Progress Note:   5 Days Post-Op  Subjective: No complaints.  Wants to go home.  Tolerating protein shakes Objective: Vital signs in last 24 hours: Temp:  [97.6 F (36.4 C)-98.2 F (36.8 C)] 98.2 F (36.8 C) (09/02 0500) Pulse Rate:  [70-83] 79 (09/02 0500) Resp:  [18-22] 20 (09/02 0500) BP: (130-137)/(62-91) 137/62 (09/02 0500) SpO2:  [93 %-96 %] 94 % (09/02 0500) Weight:  [154.4 kg] 154.4 kg (09/02 0500)  Intake/Output from previous day: 09/01 0701 - 09/02 0700 In: 609.3 [I.V.:559.3; IV Piggyback:50] Out: 7672 [Urine:3175] Intake/Output this shift: Total I/O In: -  Out: 600 [Urine:600]  Physical Exam: NAD   abd soft Bruising R side  Lab Results:  Results for orders placed or performed during the hospital encounter of 01/24/18 (from the past 48 hour(s))  Heparin level (unfractionated)     Status: None   Collection Time: 01/28/18 12:01 PM  Result Value Ref Range   Heparin Unfractionated 0.34 0.30 - 0.70 IU/mL    Comment: (NOTE) If heparin results are below expected values, and patient dosage has  been confirmed, suggest follow up testing of antithrombin III levels. Performed at [facility], [place] [address], [place], [place] [address]   Heparin level (unfractionated)     Status: None   Collection Time: 01/28/18  6:05 PM  Result Value Ref Range   Heparin Unfractionated 0.32 0.30 - 0.70 IU/mL    Comment: (NOTE) If heparin results are below expected values, and patient dosage has  been confirmed, suggest follow up testing of antithrombin III levels. Performed at [facility], [place] [address], [place], [place] [address]   CBC     Status: Abnormal   Collection Time: 01/29/18  4:55 AM  Result Value Ref Range   WBC 9.3 4.0 - 10.5 K/uL   RBC 2.49 (L) 3.87 - 5.11 MIL/uL   Hemoglobin 8.0 (L) 12.0 - 15.0 g/dL   HCT 24.8 (L) 36.0 -  46.0 %   MCV 99.6 78.0 - 100.0 fL   MCH 32.1 26.0 - 34.0 pg   MCHC 32.3 30.0 - 36.0 g/dL   RDW 16.6 (H) 11.5 - 15.5 %   Platelets 218 150 - 400 K/uL    Comment: Performed at [facility], [place] [address], [place], [place] [address]  Protime-INR     Status: Abnormal   Collection Time: 01/29/18  4:55 AM  Result Value Ref Range   Prothrombin Time 15.9 (H) 11.4 - 15.2 seconds   INR 1.29     Comment: Performed at [facility], [place] [address], [place], [place] [address]  Heparin level (unfractionated)     Status: None   Collection Time: 01/29/18  4:55 AM  Result Value Ref Range   Heparin Unfractionated 0.38 0.30 - 0.70 IU/mL    Comment: (NOTE) If heparin results are below expected values, and patient dosage has  been confirmed, suggest follow up testing of antithrombin III levels. Performed at [facility], [place] [address] [place], [place] [address]   Basic metabolic panel     Status: Abnormal   Collection Time: 01/29/18  8:53 AM  Result Value Ref Range   Sodium 142 135 - 145 mmol/L   Potassium 5.2 (H) 3.5 - 5.1 mmol/L   Chloride 107 98 - 111 mmol/L   CO2 26 22 - 32 mmol/L  Glucose, Bld 93 70 - 99 mg/dL   BUN 69 (H) 6 - 20 mg/dL   Creatinine, Ser 1.77 (H) 0.44 - 1.00 mg/dL    Comment: DELTA CHECK NOTED   Calcium 9.6 8.9 - 10.3 mg/dL   GFR calc non Af Amer 31 (L) >60 mL/min   GFR calc Af Amer 35 (L) >60 mL/min    Comment: (NOTE) The eGFR has been calculated using the CKD EPI equation. This calculation has not been validated in all clinical situations. eGFR's persistently <60 mL/min signify possible Chronic Kidney Disease.    Anion gap 9 5 - 15    Comment: Performed at [facility], [place] [address], [place], [place] [address]  CBC     Status: Abnormal   Collection Time: 01/30/18  5:02 AM  Result Value Ref Range   WBC 9.7 4.0 - 10.5 K/uL   RBC 2.74 (L) 3.87 - 5.11 MIL/uL   Hemoglobin 8.6 (L) 12.0 -  15.0 g/dL   HCT 27.8 (L) 36.0 - 46.0 %   MCV 101.5 (H) 78.0 - 100.0 fL   MCH 31.4 26.0 - 34.0 pg   MCHC 30.9 30.0 - 36.0 g/dL   RDW 16.5 (H) 11.5 - 15.5 %   Platelets 269 150 - 400 K/uL    Comment: Performed at [facility], [place] [address], [place], [place] [address]  Protime-INR     Status: Abnormal   Collection Time: 01/30/18  5:02 AM  Result Value Ref Range   Prothrombin Time 15.7 (H) 11.4 - 15.2 seconds   INR 1.26     Comment: Performed at [facility], [place] [address] [place], [place] [address]  Heparin level (unfractionated)     Status: None   Collection Time: 01/30/18  5:02 AM  Result Value Ref Range   Heparin Unfractionated 0.33 0.30 - 0.70 IU/mL    Comment: (NOTE) If heparin results are below expected values, and patient dosage has  been confirmed, suggest follow up testing of antithrombin III levels. Performed at [facility], [place] [address], [place], [place] [address]   Basic metabolic panel     Status: Abnormal   Collection Time: 01/30/18  5:02 AM  Result Value Ref Range   Sodium 142 135 - 145 mmol/L   Potassium 5.1 3.5 - 5.1 mmol/L   Chloride 106 98 - 111 mmol/L   CO2 27 22 - 32 mmol/L   Glucose, Bld 96 70 - 99 mg/dL   BUN 57 (H) 6 - 20 mg/dL   Creatinine, Ser 1.46 (H) 0.44 - 1.00 mg/dL   Calcium 9.6 8.9 - 10.3 mg/dL   GFR calc non Af Amer 39 (L) >60 mL/min   GFR calc Af Amer 45 (L) >60 mL/min    Comment: (NOTE) The eGFR has been calculated using the CKD EPI equation. This calculation has not been validated in all clinical situations. eGFR's persistently <60 mL/min signify possible Chronic Kidney Disease.    Anion gap 9 5 - 15    Comment: Performed at [facility], [place] [address], [place], [place] [address]    Radiology/Results: No results found.  Anti-infectives: Anti-infectives (From admission, onward)   Start     Dose/Rate Route Frequency Ordered Stop   01/24/18 0615   cefoTEtan (CEFOTAN) 2 g in sodium chloride 0.9 % 100 mL IVPB     2 g 200 mL/hr over 30 Minutes Intravenous On call to O.R. 01/24/18 2703 01/24/18 5009  Assessment/Plan: Problem List: Patient Active Problem List   Diagnosis Date Noted  . Acute renal failure superimposed on stage 3 chronic kidney disease ([place]) 01/26/2018  . PAF (paroxysmal atrial fibrillation) ([place])   . Chronic anticoagulation   . Acute respiratory failure ([place])   . Hypoxia   . Postprocedural hypotension   . Chronic acquired lymphedema - Right lower extremity 01/24/2018  . Hx of mechanical aortic valve replacement 01/24/2018  . Morbid obesity ([place]) 01/24/2018  . BMI 50.0-59.9, adult ([place]) 10/12/2017  . Right-sided Bell's palsy 02/07/2017  . Obstructive sleep apnea 02/03/2017  . Acute diastolic heart failure ([place]) 01/11/2017  . Cellulitis 11/30/2016  . Chronic respiratory failure ([place]) 08/12/2015  . 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 Dyspnea 07/22/2015  . Allergic rhinitis 05/02/2015  . Exertional dyspnea   . Symptomatic anemia 04/08/2015  . History of mitral valve replacement with mechanical valve 04/08/2015  . CAP (community acquired pneumonia) 04/08/2015  . CKD (chronic kidney disease) stage 3, GFR 30-59 ml/min (HCC) 04/08/2015  . Atrial flutter, unspecified   . ILD (interstitial lung disease) ([place]) 10/12/2013  . Aortic stenosis 10/02/2013  . Essential hypertension 10/02/2013  . Hyperlipidemia 10/02/2013  . Chronic asthmatic bronchitis ([place]) 08/06/2013  . Abnormal CT scan, chest 08/09/2012    Cr coming down.  Oral anticoagulation in progress and Hg stable on heparin drip.    5 Days Post-Op    LOS: 6 days   Rosario Adie, MD  Colorectal [facility] [place] [facility]

## 2018-01-31 LAB — BASIC METABOLIC PANEL
ANION GAP: 8 (ref 5–15)
BUN: 41 mg/dL — AB (ref 6–20)
CALCIUM: 9.7 mg/dL (ref 8.9–10.3)
CO2: 28 mmol/L (ref 22–32)
Chloride: 107 mmol/L (ref 98–111)
Creatinine, Ser: 1.19 mg/dL — ABNORMAL HIGH (ref 0.44–1.00)
GFR calc Af Amer: 57 mL/min — ABNORMAL LOW (ref >60)
GFR, EST NON AFRICAN AMERICAN: 49 mL/min — AB (ref >60)
Glucose, Bld: 95 mg/dL (ref 70–99)
POTASSIUM: 5 mmol/L (ref 3.5–5.1)
SODIUM: 143 mmol/L (ref 135–145)

## 2018-01-31 LAB — PROTIME-INR
INR: 1.42
Prothrombin Time: 17.2 seconds — ABNORMAL HIGH (ref 11.4–15.2)

## 2018-01-31 LAB — CBC
HEMATOCRIT: 29 % — AB (ref 36.0–46.0)
Hemoglobin: 9 g/dL — ABNORMAL LOW (ref 12.0–15.0)
MCH: 31.7 pg (ref 26.0–34.0)
MCHC: 31 g/dL (ref 30.0–36.0)
MCV: 102.1 fL — ABNORMAL HIGH (ref 78.0–100.0)
PLATELETS: 278 10*3/uL (ref 150–400)
RBC: 2.84 MIL/uL — AB (ref 3.87–5.11)
RDW: 16.5 % — ABNORMAL HIGH (ref 11.5–15.5)
WBC: 8.7 10*3/uL (ref 4.0–10.5)

## 2018-01-31 LAB — HEPARIN LEVEL (UNFRACTIONATED): HEPARIN UNFRACTIONATED: 0.39 [IU]/mL (ref 0.30–0.70)

## 2018-01-31 MED ORDER — WARFARIN SODIUM 5 MG PO TABS
7.5000 mg | ORAL_TABLET | Freq: Once | ORAL | Status: AC
  Administered 2018-01-31: 7.5 mg via ORAL
  Filled 2018-01-31: qty 1

## 2018-01-31 MED ORDER — ONDANSETRON 4 MG PO TBDP: 4.0000 mg | ORAL_TABLET | Freq: Four times a day (QID) | ORAL | 0 refills | Status: DC | PRN

## 2018-01-31 MED ORDER — ENOXAPARIN SODIUM 150 MG/ML ~~LOC~~ SOLN
150.0000 mg | Freq: Two times a day (BID) | SUBCUTANEOUS | Status: DC
  Administered 2018-01-31: 150 mg via SUBCUTANEOUS
  Filled 2018-01-31: qty 1

## 2018-01-31 MED ORDER — PANTOPRAZOLE SODIUM 40 MG PO TBEC: 40.0000 mg | DELAYED_RELEASE_TABLET | Freq: Every day | ORAL | 1 refills | Status: DC

## 2018-01-31 MED ORDER — METOPROLOL TARTRATE 25 MG PO TABS
25.0000 mg | ORAL_TABLET | Freq: Two times a day (BID) | ORAL | Status: DC
  Administered 2018-01-31: 25 mg via ORAL
  Filled 2018-01-31: qty 1

## 2018-01-31 MED ORDER — ENOXAPARIN (LOVENOX) PATIENT EDUCATION KIT
PACK | Freq: Once | Status: DC
  Filled 2018-01-31: qty 1

## 2018-01-31 MED ORDER — TRAMADOL HCL 50 MG PO TABS: 50.0000 mg | ORAL_TABLET | Freq: Four times a day (QID) | ORAL | 0 refills | Status: DC | PRN

## 2018-01-31 MED ORDER — METOPROLOL TARTRATE 50 MG PO TABS: 25.0000 mg | ORAL_TABLET | Freq: Two times a day (BID) | ORAL | 0 refills | Status: DC

## 2018-01-31 NOTE — Progress Notes (Signed)
[facility] coumadin clinic called for appointment on 02-03-18.  Appointment scheduled on 02-03-18 at [place]

## 2018-01-31 NOTE — Progress Notes (Signed)
[place] for Heparin, Warfarin Indication: mechanical MVR, mechanical AVR  No Known Allergies  Patient Measurements: Height: 5\' 5"  (165.1 cm) Weight: (!) 343 lb 14.4 oz (156 kg) IBW/kg (Calculated) : 57 Heparin Dosing Weight: 96 kg  Vital Signs: Temp: 97.7 F (36.5 C) (09/03 0428) Temp Source: Oral (09/03 0428) BP: 146/77 (09/03 0428) Pulse Rate: 83 (09/03 0428)  Labs: Recent Labs    01/29/18 0455 01/29/18 0853 01/30/18 0502 01/31/18 0502  HGB 8.0*  --  8.6* 9.0*  HCT 24.8*  --  27.8* 29.0*  PLT 218  --  269 278  LABPROT 15.9*  --  15.7* 17.2*  INR 1.29  --  1.26 1.42  HEPARINUNFRC 0.38  --  0.33 0.39  CREATININE  --  1.77* 1.46* 1.19*    Estimated Creatinine Clearance: 78.6 mL/min (A) (by C-G formula based on SCr of 1.19 mg/dL (H)).    Medications:  Scheduled:  . acetaminophen (TYLENOL) oral liquid 160 mg/5 mL  650 mg Oral Q6H  . gabapentin  200 mg Oral Q12H  . loratadine  10 mg Oral QHS  . mometasone-formoterol  2 puff Inhalation BID  . montelukast  10 mg Oral QHS  . protein supplement shake  2 oz Oral Q2H  . Warfarin - Pharmacist Dosing Inpatient   Does not apply q1800   Infusions:  . sodium chloride 25 mL/hr at 01/31/18 0600  . famotidine (PEPCID) IV 20 mg (01/31/18 0855)  . heparin 1,900 Units/hr (01/31/18 0600)  . sodium chloride      Assessment: 89 yoF admitted on 8/27 for bariatric surgery, s/p laparoscopic sleeve gastrectomy.  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  Pharmacy was consulted to resume warfarin dosing and begin bridging with Heparin IV (OK to bolus) instead of planned Lovenox due to bleeding risk.   PTA Lovenox 150mg   q12h (Started 8/19, last dose on 8/26 at 0730)  PTA Warfarin 7.5 mg daily except 3.75 mg on Wed/Fridays (Last dose on 8/21.  INR subtherapeutic at  2.3 (goal INR 2.5-3.5) on 8/19 at coumadin clinic, but previously therapeutic on 7/31 and 7/10.)  Significant Events: 8/28  POD#1 RN reports some bleeding from two of the abdominal incisions. Heparin level 0.94 on 1600 units/hr.  Supratherapeutic level likely related to heparin bolus.  8/29 POD#2 RN reports no further bleeding.  Developed AKI.  Hgb decreased further, continue holding anticoagulation and transfuse PRBC. 8/30 POD#3: OK to resume anticoagulation per Cards and CCS.   Today, 01/31/2018:  Heparin level remains therapeutic this AM on 1900 units/hr  INR continues to be subtherapeutic and rising slowly    SCr back to baseline  CBC: Hgb stable,Plt remain WNL.     No bleeding or complications reported  No major current drug-drug interactions with warfarin; preoperative aprepitant x 1 on 01/25/18 at [facility]-prophylactic dosage is noted.    Diet: Bariatric liquid diet; protein shakes with goal of 2 fl oz q2h if tolerated.    Warfarin dosing comment:  Published literature describing experiences of patients on chronic warfarin who undergo bariatric surgery demonstrates that on average, many patients require a significant reduction in their weekly warfarin dosage during the early post-operative period.   Mechanism has not been determined with certainty but could be secondary to post-operative alterations in vitamin K intake and absorption.   The weekly warfarin dosage requirement did typically return to pre-surgery  levels at approximately 90-180 days post-operatively.   Goal of Therapy:  INR 2.5-3.5 Heparin level 0.3-0.7 units/ml Monitor platelets by anticoagulation protocol: Yes   Plan:  Once INR is in range can consider empirically reducing to 5 mg daily based on study data, but given long way to reach goal range currently, and prompt INR recheck scheduled for 9/6, would discharge from hospital on 7.5 mg daily. Discontinue heparin and begin Lovenox 150 mg SQ bid until INR is  therapeutic x 24 hr (today is day #5 of warfarin bridging) Daily INR, heparin level, and CBC  Monitor for s/s bleeding     Reuel Boom, PharmD, BCPS [phone] 01/31/2018, 10:56 AM

## 2018-01-31 NOTE — Progress Notes (Signed)
Pt to be discharged.  Agree with Lovenox/coumadin crossover and will change follow up INR check to 02/03/18.    Her BP is mildly elevated  155/73 to 146/77  meds were stopped due to hypotension to 09M systolic post op and AKI.  Renal saw and BP meds held.   lasix 40 daily, losartan 50 daily, metoprolol 50 daily, spironolactone 25 daily, KCl 40 daily all held   neph noted we could resume arb, will have Dr. Radford Pax to see - ARB vs BB.

## 2018-01-31 NOTE — Progress Notes (Addendum)
Patient alert and oriented, pain is controlled. Patient is tolerating fluids.  Reinforced Gastric sleeve discharge instructions with patient and patient is able to articulate understanding.  Provided information on BELT program, Support Group and [facility] outpatient pharmacy. All questions answered, will continue to monitor.  Total fluid 440. Per dehydration protocol callback 02-02-18.

## 2018-01-31 NOTE — Progress Notes (Addendum)
Pt seen & examined No c/o. No n/v. Tolerating bari fulls. No sob other than 'my usual'  cta Reg, click Obese, bruising, incisions ok Chronic RLE lymphedema, no LLE edema  I think she can go home today with lovenox/coumadin bridge.  Her hgb has been stable for several days on hep/coumadin Her renal function is back to baseline She hasn't been on any of her home BP meds However her BP is a little elevated   Will ask cards if they are ok with  Lovenox/coumadin bridge? Probable will need a coumadin clinic appt on 9/6 as opposed to 9/9 meds for discharge (lopressor over ARB? Hold lasix? Decrease lasix dose)  [name]. [name], MD, FACS General, Bariatric, & Minimally Invasive Surgery [facility], [place]

## 2018-01-31 NOTE — Care Management Note (Signed)
Case Management Note  Patient Details  Name: [name] MRN: [id number] Date of Birth: [birth year]  Subjective/Objective: Noted for possible d/c today. No CM needs.                   Action/Plan:d/c home.   Expected Discharge Date:                  Expected Discharge Plan:  Home/Self Care  In-House Referral:     Discharge planning Services  CM Consult  Post Acute Care Choice:    Choice offered to:     DME Arranged:    DME Agency:     HH Arranged:    HH Agency:     Status of Service:  In process, will continue to follow  If discussed at Long Length of Stay Meetings, dates discussed:    Additional Comments:  Dessa Phi, RN 01/31/2018, 11:07 AM

## 2018-01-31 NOTE — Discharge Summary (Signed)
Physician Discharge Summary  [name] NOB:[id number] DOB: 05-21-1961 DOA: 01/24/2018  PCP: [name], FNP  Admit date: 01/24/2018 Discharge date: 01/31/2018  Recommendations for Outpatient Follow-up:   Follow-up Information    Greer Pickerel, MD. Go on 02/08/2018.   Specialty:  General Surgery Why:  at [address] information: [place] [address] [phone]        Greer Pickerel, MD .   Specialty:  General Surgery Contact information: [place] [address] [phone]        [name], NP Follow up on 02/07/2018.   Specialties:  Nurse Practitioner, Interventional Cardiology, Cardiology, Radiology Why:  at 11:00 AM  Contact information: [place]. [address] [place] [address] [phone]        [facility] Follow up on 02/03/2018.   Specialty:  Cardiology Why:  at 8:45 AM  for coumadin check   Contact information: [address], [place] [phone]         Discharge Diagnoses:  Principal Problem:   BMI 50.0-59.9, adult [facility]) Active Problems:   Essential hypertension   Hyperlipidemia   ILD (interstitial lung disease) ([place])   Symptomatic anemia - acute blood loss anemia   History of mitral valve replacement with mechanical valve   CKD (chronic kidney disease) stage 3, GFR 30-59 ml/min (HCC)   Intrinsic asthma   Obstructive sleep apnea   Chronic acquired lymphedema - Right lower extremity   Hx of mechanical aortic valve replacement   Morbid obesity (HCC)   Postprocedural hypotension   PAF (paroxysmal atrial fibrillation) (HCC)   Chronic anticoagulation    Hypoxia   Acute renal failure superimposed on stage 3 chronic kidney disease ([facility]) - resolved   Surgical Procedure: Laparoscopic Sleeve Gastrectomy, upper endoscopy  Discharge Condition: Good Disposition: Home  Diet recommendation: Postoperative sleeve  gastrectomy diet (liquids only)  Filed Weights   01/29/18 0639 01/30/18 0500 01/31/18 0648  Weight: (!) 157.7 kg (!) 154.4 kg (!) 156 kg     Hospital Course:  The patient was admitted for a planned laparoscopic sleeve gastrectomy. Please see operative note.  She has double mechanical heart valves and is on chronic Coumadin.  Preoperatively she was switched to a Lovenox bridge.  The morning of surgery she was given 40 mg of subcutaneous Lovenox.  She was taken to the operating room for planned laparoscopic sleeve gastrectomy.  Postoperatively she was placed in stepdown unit because of her multiple medical comorbidities primarily due to her intrinsic lung disease requiring home oxygen.  She was started on a heparin drip with bolus on postop day 0 about 12 hours after surgery.  She also received her first dose of Coumadin on postoperative day 0.  early on the morning of postoperative day 1 she became hypotensive.  Her hemoglobin decreased.  The heparin drip was stopped.  She received several fluid boluses throughout the morning and her blood pressure improved.  Her hemoglobin continued to drift downward.  She ended up getting transfused with 2 units of PRBCs throughout her hospitalization.  Once her hemoglobin had stabilized the heparin drip was restarted without a bolus.  Her urine output was not that great on postoperative day 1.  A Foley catheter was placed late postoperative day 1.  On postoperative day 2 her creatinine increased to above 4.  Nephrology was consulted.  All of her home blood pressure medications were  held while in the hospital.  Over the next several days her creatinine normalized and her urine output returned to normal.  Foley catheter was removed.  Heparin was continued without any signs of bleeding and her Coumadin was restarted.  She was transferred out of stepdown unit to telemetry.  Throughout this entire stay she was tolerating a bariatric postoperative diet.  Physical therapy and  Occupational Therapy work with the patient.  On day of discharge she was felt stable for discharge.  She was tolerating a diet.  Her pain was well controlled.  Her vital signs are stable.  Her creatinine had normalized to her baseline level.  Her hemoglobin was stable after several days of being anticoagulated.  She was still not therapeutic on her Coumadin so a Lovenox bridge was restarted.  The patient has a Coumadin clinic appointment this coming Friday.  Consults during this admission- pulmonary, cardiology, nephrology, physical therapy, Occupational Therapy  BP (!) 147/72 (BP Location: Right Arm)   Pulse 72   Temp 98.9 F (37.2 C) (Oral)   Resp 18   Ht 5\' 5"  (1.651 m)   Wt (!) 156 kg   SpO2 96%   BMI 57.23 kg/m   Gen: alert, NAD, non-toxic appearing Pupils: equal, no scleral icterus Pulm: Lungs clear to auscultation, symmetric chest rise CV: regular rate and click Abd: soft, min tender, obese; bruising on abd wall. No cellulitis. Ext: no edema, no calf tenderness; chronic RLE lymphedema Skin: no rash, no jaundice    Discharge Instructions  Discharge Instructions    Ambulate hourly while awake   Complete by:  As directed    Call MD for:  difficulty breathing, headache or visual disturbances   Complete by:  As directed    Call MD for:  persistant dizziness or light-headedness   Complete by:  As directed    Call MD for:  persistant nausea and vomiting   Complete by:  As directed    Call MD for:  redness, tenderness, or signs of infection (pain, swelling, redness, odor or green/yellow discharge around incision site)   Complete by:  As directed    Call MD for:  severe uncontrolled pain   Complete by:  As directed    Call MD for:  temperature >101 F   Complete by:  As directed    Diet bariatric full liquid   Complete by:  As directed    Discharge instructions   Complete by:  As directed    See bariatric discharge instructions   Incentive spirometry   Complete by:  As  directed    Perform hourly while awake     Allergies as of 01/31/2018   No Known Allergies     Medication List    STOP taking these medications   aspirin EC 81 MG tablet   furosemide 40 MG tablet Commonly known as:  LASIX   losartan 50 MG tablet Commonly known as:  COZAAR   potassium chloride SA 20 MEQ tablet Commonly known as:  K-DUR,KLOR-CON     TAKE these medications   acetaminophen 650 MG CR tablet Commonly known as:  TYLENOL Take 1,300 mg by mouth 3 (three) times daily.   albuterol 108 (90 Base) MCG/ACT inhaler Commonly known as:  PROVENTIL HFA;VENTOLIN HFA Inhale 2 puffs into the lungs 2 (two) times daily as needed for wheezing or shortness of breath. Reported on 06/20/2015   albuterol (2.5 MG/3ML) 0.083% nebulizer solution Commonly known as:  PROVENTIL Take 3 mLs (2.5 mg  total) by nebulization every 4 (four) hours as needed for wheezing or shortness of breath.   allopurinol 300 MG tablet Commonly known as:  ZYLOPRIM Take 300 mg by mouth at bedtime.   buPROPion 300 MG 24 hr tablet Commonly known as:  WELLBUTRIN XL Take 300 mg by mouth daily.   CENTRUM SILVER 50+WOMEN Tabs Take 1 tablet by mouth daily.   cetirizine 10 MG tablet Commonly known as:  ZYRTEC Take 1 tablet (10 mg total) by mouth daily. What changed:  when to take this   citalopram 20 MG tablet Commonly known as:  CELEXA Take 20 mg by mouth daily.   enoxaparin 150 MG/ML injection Commonly known as:  LOVENOX Inject 1 mL (150 mg total) into the skin every 12 (twelve) hours.   metoprolol tartrate 50 MG tablet Commonly known as:  LOPRESSOR Take 0.5 tablets (25 mg total) by mouth 2 (two) times daily. What changed:  how much to take   montelukast 10 MG tablet Commonly known as:  SINGULAIR Take 10 mg by mouth daily.   ondansetron 4 MG disintegrating tablet Commonly known as:  ZOFRAN-ODT Take 1 tablet (4 mg total) by mouth every 6 (six) hours as needed for nausea or vomiting.    OXYGEN Inhale 2.5-3 L into the lungs continuous.   pantoprazole 40 MG tablet Commonly known as:  PROTONIX Take 1 tablet (40 mg total) by mouth daily.   pravastatin 80 MG tablet Commonly known as:  PRAVACHOL Take 80 mg by mouth daily.   SYMBICORT 80-4.5 MCG/ACT inhaler Generic drug:  budesonide-formoterol TAKE 2 PUFFS BY MOUTH TWICE A DAY What changed:  See the new instructions.   traMADol 50 MG tablet Commonly known as:  ULTRAM Take 1 tablet (50 mg total) by mouth every 6 (six) hours as needed (pain).   triamcinolone 55 MCG/ACT Aero nasal inhaler Commonly known as:  NASACORT Place 2 sprays into the nose daily. What changed:    when to take this  reasons to take this   Vitamin D3 2000 units Tabs Take 4,000 Units by mouth daily.   warfarin 7.5 MG tablet Commonly known as:  COUMADIN Take as directed. If you are unsure how to take this medication, talk to your nurse or doctor. Original instructions:  TAKE 1 TABLET BY MOUTH DAILY OR AS DIRECTED BY COUMADIN CLINIC What changed:  See the new instructions.      Follow-up Information    Greer Pickerel, MD. Go on 02/08/2018.   Specialty:  General Surgery Why:  at [address] information: [place] [address] [phone]        Greer Pickerel, MD .   Specialty:  General Surgery Contact information: [place] [address] [phone]        [name], NP Follow up on 02/07/2018.   Specialties:  Nurse Practitioner, Interventional Cardiology, Cardiology, Radiology Why:  at 11:00 AM  Contact information: [place]. [address] [place] [address] [phone]        [facility] Follow up on 02/03/2018.   Specialty:  Cardiology Why:  at 8:45 AM  for coumadin check   Contact information: [address] [place] [phone]           The results of significant diagnostics from this  hospitalization (including imaging, microbiology, ancillary and laboratory) are listed below for reference.    Significant Diagnostic Studies: Dg Chest  2 View  Result Date: 01/12/2018 CLINICAL DATA:  Preop for bariatric surgery. EXAM: CHEST - 2 VIEW COMPARISON:  Radiographs of April 12, 2017. FINDINGS: Stable cardiomediastinal silhouette. Status post cardiac valve repair. No pneumothorax or pleural effusion is noted. Both lungs are clear. The visualized skeletal structures are unremarkable. IMPRESSION: No active cardiopulmonary disease. Electronically Signed   By: Marijo Conception, M.D.   On: 01/12/2018 11:50   US Renal  Result Date: 01/26/2018 CLINICAL DATA:  Acute renal insufficiency.  Morbid obesity. EXAM: RENAL / URINARY TRACT ULTRASOUND COMPLETE COMPARISON:  Abdominal ultrasound 04/12/2017 FINDINGS: Right Kidney: Length: 10.2 cm. Echogenicity within normal limits. No mass or hydronephrosis visualized. Left Kidney: Length: 12.0 cm. Echogenicity within normal limits. No mass or hydronephrosis visualized. Bladder: Poorly visualized.  Urinary Foley catheter in place. Increased echogenicity of the liver. Small volume abdominal ascites. IMPRESSION: Study limited by body habitus. Normal appearance of the kidneys. Increased echogenicity of the liver. Small volume perihepatic abdominal ascites. Electronically Signed   By: Fidela Salisbury M.D.   On: 01/26/2018 19:28   Dg Chest Port 1 View  Result Date: 01/25/2018 CLINICAL DATA:  Hypoxia.  Hernia repair. EXAM: PORTABLE CHEST 1 VIEW COMPARISON:  The one-view chest x-ray 01/24/2018 FINDINGS: Heart is enlarged. Lung volumes are low. Increased pulmonary vascular congestion is present. Bibasilar airspace disease likely reflects atelectasis. No other significant consolidation is present. The visualized soft tissues and bony thorax are unremarkable. IMPRESSION: 1. Cardiopulmonary with increased pulmonary vascular congestion compatible with early congestive  heart failure. 2. Low lung volumes with increased bibasilar airspace disease, likely atelectasis. Electronically Signed   By: San Morelle M.D.   On: 01/25/2018 10:00   Dg Chest Port 1 View  Result Date: 01/24/2018 CLINICAL DATA:  Shortness of breath, acute respiratory failure EXAM: PORTABLE CHEST 1 VIEW COMPARISON:  Chest x-ray of 01/12/2017 FINDINGS: Moderate cardiomegaly is stable. Median sternotomy sutures are again noted without complication. Aortic and mitral valve replacements are present. No acute infiltrate or effusion is seen. IMPRESSION: Stable cardiomegaly.  No active lung disease. Electronically Signed   By: Ivar Drape M.D.   On: 01/24/2018 17:03    Labs: Basic Metabolic Panel: Recent Labs  Lab 01/25/18 0206 01/26/18 0303 01/26/18 1753 01/27/18 0333 01/28/18 0217 01/29/18 0853 01/30/18 0502 01/31/18 0502  NA 140 139 136 135 137 142 142 143  K 5.0 5.4* 5.0 5.1 4.9 5.2* 5.1 5.0  CL 99 99 96* 95* 98 107 106 107  CO2 30 28 27 26 28 26 27 28   GLUCOSE 178* 135* 126* 118* 111* 93 96 95  BUN 36* 64* 72* 73* 80* 69* 57* 41*  CREATININE 1.48* 3.80* 4.47* 4.22* 3.33* 1.77* 1.46* 1.19*  CALCIUM 9.1 8.9 9.0 8.9 9.3 9.6 9.6 9.7  MG  --   --   --   --  2.4  --   --   --   PHOS  --   --   --  6.2* 5.4*  --   --   --    Liver Function Tests: Recent Labs  Lab 01/25/18 0206 01/26/18 0303 01/27/18 0333  AST 61* 48*  --   ALT 41 36  --   ALKPHOS 51 44  --   BILITOT 0.6 0.7  --   PROT 7.5 6.8  --   ALBUMIN 3.9 3.9 3.6    CBC: Recent Labs  Lab 01/25/18 0206 01/25/18 0803  01/26/18 0303  01/26/18 1753  01/27/18 4259 01/28/18 0217 01/29/18 0455  01/30/18 0502 01/31/18 0502  WBC 14.3* 19.0*  --  17.8*  --  16.0*  --  16.1* 11.5* 9.3 9.7 8.7  NEUTROABS 12.6*  --   --  13.8*  --   --   --   --   --   --   --   --   HGB 11.4* 9.6*   < > 8.0*   < > 8.0*   < > 8.5* 7.8* 8.0* 8.6* 9.0*  HCT 36.1 30.5*   < > 25.2*   < > 24.8*   < > 26.2* 23.8* 24.8* 27.8* 29.0*  MCV  100.6* 102.0*  --  102.0*  --  99.6  --  97.8 96.7 99.6 101.5* 102.1*  PLT 234 246  --  183  --  164  --  168 173 218 269 278   < > = values in this interval not displayed.    CBG: Recent Labs  Lab 01/26/18 1542  GLUCAP 122*    Principal Problem:   BMI 50.0-59.9, adult (HCC) Active Problems:   Essential hypertension   Hyperlipidemia   ILD (interstitial lung disease) (HCC)   Symptomatic anemia   History of mitral valve replacement with mechanical valve   CKD (chronic kidney disease) stage 3, GFR 30-59 ml/min (HCC)   Intrinsic asthma   Obstructive sleep apnea   Chronic acquired lymphedema - Right lower extremity   Hx of mechanical aortic valve replacement   Morbid obesity (HCC)   Postprocedural hypotension   PAF (paroxysmal atrial fibrillation) ([place])   Chronic anticoagulation   Acute respiratory failure ([place])   Hypoxia   Acute renal failure superimposed on stage 3 chronic kidney disease ([place])   Time coordinating discharge: 30 min  Signed:  Gayland Curry, MD [facility], [place] [phone] 01/31/2018, 2:20 PM

## 2018-01-31 NOTE — Progress Notes (Signed)
PT Cancellation Note  Patient Details Name: [name] MRN: [id number] DOB: March 28, 1961   Cancelled Treatment:    Reason Eval/Treat Not Completed: Patient declined, states that she needs to wake up. States that she would like to just go on home. No needs for DC per pt. Check back another time.    Marcelino Freestone PT [phone]  01/31/2018, 8:41 AM

## 2018-01-31 NOTE — Discharge Instructions (Signed)
Take 7.5 mg of coumadin daily until you see coumadin clinic on this Friday Inject 150mg  of lovenox twice a day until your INR>2      GASTRIC BYPASS/SLEEVE  Home Care Instructions   These instructions are to help you care for yourself when you go home.  Call: If you have any problems.  Call [phone] and ask for the surgeon on call  If you need immediate help, come to the ER at [facility].   Tell the ER staff that you are a new post-op gastric bypass or gastric sleeve patient   Signs and symptoms to report:  Severe vomiting or nausea o If you cannot keep down clear liquids for longer than 1 day, call your surgeon   Abdominal pain that does not get better after taking your pain medication  Fever over 100.4 F with chills  Heart beating over 100 beats a minute  Shortness of breath at rest  Chest pain   Redness, swelling, drainage, or foul odor at incision (surgical) sites   If your incisions open or pull apart  Swelling or pain in calf (lower leg)  Diarrhea (Loose bowel movements that happen often), frequent watery, uncontrolled bowel movements  Constipation, (no bowel movements for 3 days) if this happens: Pick one o Milk of Magnesia, 2 tablespoons by mouth, 3 times a day for 2 days if needed o Stop taking Milk of Magnesia once you have a bowel movement o Call your doctor if constipation continues Or o Miralax  (instead of Milk of Magnesia) following the label instructions o Stop taking Miralax once you have a bowel movement o Call your doctor if constipation continues  Anything you think is not normal   Normal side effects after surgery:  Unable to sleep at night or unable to focus  Irritability or moody  Being tearful (crying) or depressed These are common complaints, possibly related to your anesthesia medications that put you to sleep, stress of surgery, and change in lifestyle.  This usually goes away a few weeks after surgery.  If these feelings  continue, call your primary care doctor.   Wound Care: You may have surgical glue, steri-strips, or staples over your incisions after surgery  Surgical glue:  Looks like a clear film over your incisions and will wear off a little at a time  Steri-strips: Strips of tape over your incisions. You may notice a yellowish color on the skin under the steri-strips. This is used to make the   steri-strips stick better. Do not pull the steri-strips off - let them fall off  Staples: Staples may be removed before you leave the hospital o If you go home with staples, call [facility], [phone]) [phone] at for an appointment with your surgeons nurse to have staples removed 10 days after surgery.  Showering: You may shower two (2) days after your surgery unless your surgeon tells you differently o Wash gently around incisions with warm soapy water, rinse well, and gently pat dry  o No tub baths until staples are removed, steri-strips fall off or glue is gone.    Medications:  Medications should be liquid or crushed if larger than the size of a dime  Extended release pills (medication that release a little bit at a time through the day) should NOT be crushed or cut. (examples include XL, ER, DR, SR)  Depending on the size and number of medications you take, you may need to space (take a few throughout the day)/change the  time you take your medications so that you do not over-fill your pouch (smaller stomach)  Make sure you follow-up with your primary care doctor to make medication changes needed during rapid weight loss and life-style changes  If you have diabetes, follow up with the doctor that orders your diabetes medication(s) within one week after surgery and check your blood sugar regularly.  Do not drive while taking prescription pain medication   It is ok to take Tylenol by the bottle instructions with your pain medicine or instead of your pain medicine as needed.  DO NOT TAKE NSAIDS  (EXAMPLES OF NSAIDS:  IBUPROFREN/ NAPROXEN)  Diet:                    First 2 Weeks  You will see the dietician t about two (2) weeks after your surgery. The dietician will increase the types of foods you can eat if you are handling liquids well:  If you have severe vomiting or nausea and cannot keep down clear liquids lasting longer than 1 day, call your surgeon @ [phone]) Protein Shake  Drink at least 2 ounces of shake 5-6 times per day  Each serving of protein shakes (usually 8 - 12 ounces) should have: o 15 grams of protein  o And no more than 5 grams of carbohydrate   Goal for protein each day: o Men = 80 grams per day o Women = 60 grams per day  Protein powder may be added to fluids such as non-fat milk or Lactaid milk or unsweetened Soy/Almond milk (limit to 35 grams added protein powder per serving)  Hydration  Slowly increase the amount of water and other clear liquids as tolerated (See Acceptable Fluids)  Slowly increase the amount of protein shake as tolerated    Sip fluids slowly and throughout the day.  Do not use straws.  May use sugar substitutes in small amounts (no more than 6 - 8 packets per day; i.e. Splenda)  Fluid Goal  The first goal is to drink at least 8 ounces of protein shake/drink per day (or as directed by the nutritionist); some examples of protein shakes are [employer], [employer], EAS Edge HP, and Unjury. See handout from pre-op Bariatric Education Class: o Slowly increase the amount of protein shake you drink as tolerated o You may find it easier to slowly sip shakes throughout the day o It is important to get your proteins in first  Your fluid goal is to drink 64 - 100 ounces of fluid daily o It may take a few weeks to build up to this  32 oz (or more) should be clear liquids  And   32 oz (or more) should be full liquids (see below for examples)  Liquids should not contain sugar, caffeine, or carbonation  Clear  Liquids:  Water or Sugar-free flavored water (i.e. Fruit H2O, Propel)  Decaffeinated coffee or tea (sugar-free)  Crystal Lite, Wylers Lite, Minute Maid Lite  Sugar-free Jell-O  Bouillon or broth  Sugar-free Popsicle:   *Less than 20 calories each; Limit 1 per day  Full Liquids: Protein Shakes/Drinks + 2 choices per day of other full liquids  Full liquids must be: o No More Than 15 grams of Carbs per serving  o No More Than 3 grams of Fat per serving  Strained low-fat cream soup (except Cream of Potato or Tomato)  Non-Fat milk  Fat-free Lactaid Milk  Unsweetened Soy Or Unsweetened Almond Milk  Low Sugar yogurt (  [employer], [place] yogurt; Oikos Triple Zero; Chobani Simply 100; Yoplait 100 calorie [place] - No Fruit on the Bottom)    Vitamins and Minerals  Start 1 day after surgery unless otherwise directed by your surgeon  2 Chewable Bariatric Specific Multivitamin / Multimineral Supplement with iron (Example: Bariatric Advantage Multi EA)  Chewable Calcium with Vitamin D-3 (Example: 3 Chewable Calcium Plus 600 with Vitamin D-3) o Take 500 mg three (3) times a day for a total of 1500 mg each day o Do not take all 3 doses of calcium at one time as it may cause constipation, and you can only absorb 500 mg  at a time  o Do not mix multivitamins containing iron with calcium supplements; take 2 hours apart  Menstruating women and those with a history of anemia (a blood disease that causes weakness) may need extra iron o Talk with your doctor to see if you need more iron  Do not stop taking or change any vitamins or minerals until you talk to your dietitian or surgeon  Your Dietitian and/or surgeon must approve all vitamin and mineral supplements   Activity and Exercise: Limit your physical activity as instructed by your doctor.  It is important to continue walking at home.  During this time, use these guidelines:  Do not lift anything greater than ten (10) pounds for  at least two (2) weeks  Do not go back to work or drive until Engineer, production says you can  You may have sex when you feel comfortable  o It is VERY important for female patients to use a reliable birth control method; fertility often increases after surgery  o All hormonal birth control will be ineffective for 30 days after surgery due to medications given during surgery a barrier method must be used. o Do not get pregnant for at least 18 months  Start exercising as soon as your doctor tells you that you can o Make sure your doctor approves any physical activity  Start with a simple walking program  Walk 5-15 minutes each day, 7 days per week.   Slowly increase until you are walking 30-45 minutes per day Consider joining our [place] program. [phone] or email [email or web address]   Special Instructions Things to remember:  Use your CPAP when sleeping if this applies to you   [facility] has two free Bariatric Surgery Support Groups that meet monthly o The 3rd Thursday of each month, 6 pm, [facility]  o The 2nd Friday of each month, 11:45 am in the private dining room in the basement of [facility]  It is very important to keep all follow up appointments with your surgeon, dietitian, primary care physician, and behavioral health practitioner  Routine follow up schedule with your surgeon include appointments at 2-3 weeks, 6-8 weeks, 6 months, and 1 year at a minimum.  Your surgeon may request to see you more often.   o After the first year, please follow up with your bariatric surgeon and dietitian at least once a year in order to maintain best weight loss results [facility]: [place]: [phone] Bariatric Nurse Coordinator: [phone]      Reviewed and Endorsed  by [facility] Patient Education Committee, June, 2016 Edits Approved: Aug, 2018

## 2018-02-01 ENCOUNTER — Emergency Department (HOSPITAL_COMMUNITY): Payer: Medicare Other

## 2018-02-01 ENCOUNTER — Inpatient Hospital Stay (HOSPITAL_COMMUNITY)
Admission: EM | Admit: 2018-02-01 | Discharge: 2018-02-12 | DRG: 907 | Disposition: A | Discharge status: Home Health | Payer: Medicare Other | Attending: General Surgery | Admitting: General Surgery

## 2018-02-01 ENCOUNTER — Other Ambulatory Visit: Payer: Self-pay

## 2018-02-01 ENCOUNTER — Telehealth: Payer: Self-pay | Admitting: Surgery

## 2018-02-01 ENCOUNTER — Encounter (HOSPITAL_COMMUNITY): Payer: Self-pay | Admitting: Emergency Medicine

## 2018-02-01 DIAGNOSIS — Z903 Acquired absence of stomach [part of]: Secondary | ICD-10-CM

## 2018-02-01 DIAGNOSIS — R1084 Generalized abdominal pain: Secondary | ICD-10-CM | POA: Diagnosis not present

## 2018-02-01 DIAGNOSIS — J9811 Atelectasis: Secondary | ICD-10-CM | POA: Diagnosis not present

## 2018-02-01 DIAGNOSIS — R231 Pallor: Secondary | ICD-10-CM | POA: Diagnosis not present

## 2018-02-01 DIAGNOSIS — I4892 Unspecified atrial flutter: Secondary | ICD-10-CM | POA: Diagnosis present

## 2018-02-01 DIAGNOSIS — N179 Acute kidney failure, unspecified: Secondary | ICD-10-CM | POA: Diagnosis present

## 2018-02-01 DIAGNOSIS — I480 Paroxysmal atrial fibrillation: Secondary | ICD-10-CM | POA: Diagnosis present

## 2018-02-01 DIAGNOSIS — Z7951 Long term (current) use of inhaled steroids: Secondary | ICD-10-CM

## 2018-02-01 DIAGNOSIS — Z79899 Other long term (current) drug therapy: Secondary | ICD-10-CM

## 2018-02-01 DIAGNOSIS — Z87891 Personal history of nicotine dependence: Secondary | ICD-10-CM

## 2018-02-01 DIAGNOSIS — J9622 Acute and chronic respiratory failure with hypercapnia: Secondary | ICD-10-CM | POA: Diagnosis not present

## 2018-02-01 DIAGNOSIS — R52 Pain, unspecified: Secondary | ICD-10-CM | POA: Diagnosis not present

## 2018-02-01 DIAGNOSIS — Z9884 Bariatric surgery status: Secondary | ICD-10-CM

## 2018-02-01 DIAGNOSIS — K91870 Postprocedural hematoma of a digestive system organ or structure following a digestive system procedure: Principal | ICD-10-CM | POA: Diagnosis present

## 2018-02-01 DIAGNOSIS — J9610 Chronic respiratory failure, unspecified whether with hypoxia or hypercapnia: Secondary | ICD-10-CM | POA: Diagnosis present

## 2018-02-01 DIAGNOSIS — Z452 Encounter for adjustment and management of vascular access device: Secondary | ICD-10-CM

## 2018-02-01 DIAGNOSIS — I272 Pulmonary hypertension, unspecified: Secondary | ICD-10-CM | POA: Diagnosis present

## 2018-02-01 DIAGNOSIS — I5033 Acute on chronic diastolic (congestive) heart failure: Secondary | ICD-10-CM | POA: Diagnosis present

## 2018-02-01 DIAGNOSIS — J9621 Acute and chronic respiratory failure with hypoxia: Secondary | ICD-10-CM | POA: Diagnosis not present

## 2018-02-01 DIAGNOSIS — E785 Hyperlipidemia, unspecified: Secondary | ICD-10-CM | POA: Diagnosis present

## 2018-02-01 DIAGNOSIS — R140 Abdominal distension (gaseous): Secondary | ICD-10-CM | POA: Diagnosis not present

## 2018-02-01 DIAGNOSIS — D62 Acute posthemorrhagic anemia: Secondary | ICD-10-CM | POA: Diagnosis present

## 2018-02-01 DIAGNOSIS — R578 Other shock: Secondary | ICD-10-CM | POA: Diagnosis present

## 2018-02-01 DIAGNOSIS — K661 Hemoperitoneum: Secondary | ICD-10-CM | POA: Diagnosis present

## 2018-02-01 DIAGNOSIS — G9341 Metabolic encephalopathy: Secondary | ICD-10-CM | POA: Diagnosis not present

## 2018-02-01 DIAGNOSIS — K117 Disturbances of salivary secretion: Secondary | ICD-10-CM

## 2018-02-01 DIAGNOSIS — N183 Chronic kidney disease, stage 3 unspecified: Secondary | ICD-10-CM | POA: Diagnosis present

## 2018-02-01 DIAGNOSIS — R0902 Hypoxemia: Secondary | ICD-10-CM

## 2018-02-01 DIAGNOSIS — IMO0001 Reserved for inherently not codable concepts without codable children: Secondary | ICD-10-CM

## 2018-02-01 DIAGNOSIS — D500 Iron deficiency anemia secondary to blood loss (chronic): Secondary | ICD-10-CM | POA: Diagnosis not present

## 2018-02-01 DIAGNOSIS — Z9981 Dependence on supplemental oxygen: Secondary | ICD-10-CM

## 2018-02-01 DIAGNOSIS — D649 Anemia, unspecified: Secondary | ICD-10-CM | POA: Diagnosis not present

## 2018-02-01 DIAGNOSIS — J449 Chronic obstructive pulmonary disease, unspecified: Secondary | ICD-10-CM | POA: Diagnosis present

## 2018-02-01 DIAGNOSIS — I4891 Unspecified atrial fibrillation: Secondary | ICD-10-CM | POA: Diagnosis present

## 2018-02-01 DIAGNOSIS — I959 Hypotension, unspecified: Secondary | ICD-10-CM | POA: Diagnosis not present

## 2018-02-01 DIAGNOSIS — J9600 Acute respiratory failure, unspecified whether with hypoxia or hypercapnia: Secondary | ICD-10-CM

## 2018-02-01 DIAGNOSIS — Z5309 Procedure and treatment not carried out because of other contraindication: Secondary | ICD-10-CM

## 2018-02-01 DIAGNOSIS — Z952 Presence of prosthetic heart valve: Secondary | ICD-10-CM

## 2018-02-01 DIAGNOSIS — I5031 Acute diastolic (congestive) heart failure: Secondary | ICD-10-CM

## 2018-02-01 DIAGNOSIS — I10 Essential (primary) hypertension: Secondary | ICD-10-CM | POA: Diagnosis not present

## 2018-02-01 DIAGNOSIS — F329 Major depressive disorder, single episode, unspecified: Secondary | ICD-10-CM | POA: Diagnosis present

## 2018-02-01 DIAGNOSIS — R109 Unspecified abdominal pain: Secondary | ICD-10-CM | POA: Diagnosis not present

## 2018-02-01 DIAGNOSIS — G4733 Obstructive sleep apnea (adult) (pediatric): Secondary | ICD-10-CM | POA: Diagnosis present

## 2018-02-01 DIAGNOSIS — Y848 Other medical procedures as the cause of abnormal reaction of the patient, or of later complication, without mention of misadventure at the time of the procedure: Secondary | ICD-10-CM | POA: Diagnosis present

## 2018-02-01 DIAGNOSIS — S36892A Contusion of other intra-abdominal organs, initial encounter: Secondary | ICD-10-CM | POA: Diagnosis present

## 2018-02-01 DIAGNOSIS — J849 Interstitial pulmonary disease, unspecified: Secondary | ICD-10-CM | POA: Diagnosis present

## 2018-02-01 DIAGNOSIS — Z954 Presence of other heart-valve replacement: Secondary | ICD-10-CM

## 2018-02-01 DIAGNOSIS — S3692XA Contusion of unspecified intra-abdominal organ, initial encounter: Secondary | ICD-10-CM

## 2018-02-01 DIAGNOSIS — I440 Atrioventricular block, first degree: Secondary | ICD-10-CM | POA: Diagnosis present

## 2018-02-01 DIAGNOSIS — I890 Lymphedema, not elsewhere classified: Secondary | ICD-10-CM

## 2018-02-01 DIAGNOSIS — S301XXA Contusion of abdominal wall, initial encounter: Secondary | ICD-10-CM | POA: Diagnosis not present

## 2018-02-01 DIAGNOSIS — R627 Adult failure to thrive: Secondary | ICD-10-CM | POA: Diagnosis present

## 2018-02-01 DIAGNOSIS — E781 Pure hyperglyceridemia: Secondary | ICD-10-CM | POA: Diagnosis present

## 2018-02-01 DIAGNOSIS — Z6841 Body Mass Index (BMI) 40.0 and over, adult: Secondary | ICD-10-CM

## 2018-02-01 DIAGNOSIS — Z9229 Personal history of other drug therapy: Secondary | ICD-10-CM

## 2018-02-01 DIAGNOSIS — I130 Hypertensive heart and chronic kidney disease with heart failure and stage 1 through stage 4 chronic kidney disease, or unspecified chronic kidney disease: Secondary | ICD-10-CM | POA: Diagnosis present

## 2018-02-01 DIAGNOSIS — R509 Fever, unspecified: Secondary | ICD-10-CM | POA: Diagnosis not present

## 2018-02-01 DIAGNOSIS — M109 Gout, unspecified: Secondary | ICD-10-CM | POA: Diagnosis present

## 2018-02-01 DIAGNOSIS — D72829 Elevated white blood cell count, unspecified: Secondary | ICD-10-CM | POA: Diagnosis present

## 2018-02-01 DIAGNOSIS — D589 Hereditary hemolytic anemia, unspecified: Secondary | ICD-10-CM

## 2018-02-01 DIAGNOSIS — J4489 Other specified chronic obstructive pulmonary disease: Secondary | ICD-10-CM | POA: Diagnosis present

## 2018-02-01 DIAGNOSIS — E875 Hyperkalemia: Secondary | ICD-10-CM | POA: Diagnosis present

## 2018-02-01 DIAGNOSIS — I5032 Chronic diastolic (congestive) heart failure: Secondary | ICD-10-CM | POA: Diagnosis present

## 2018-02-01 DIAGNOSIS — T402X5A Adverse effect of other opioids, initial encounter: Secondary | ICD-10-CM | POA: Diagnosis not present

## 2018-02-01 DIAGNOSIS — Z7901 Long term (current) use of anticoagulants: Secondary | ICD-10-CM

## 2018-02-01 HISTORY — DX: Bell's palsy: G51.0

## 2018-02-01 LAB — COMPREHENSIVE METABOLIC PANEL
ALBUMIN: 3.2 g/dL — AB (ref 3.5–5.0)
ALK PHOS: 59 U/L (ref 38–126)
ALT: 26 U/L (ref 0–44)
ANION GAP: 13 (ref 5–15)
AST: 36 U/L (ref 15–41)
BUN: 47 mg/dL — ABNORMAL HIGH (ref 6–20)
CALCIUM: 9.4 mg/dL (ref 8.9–10.3)
CO2: 20 mmol/L — AB (ref 22–32)
Chloride: 108 mmol/L (ref 98–111)
Creatinine, Ser: 2.53 mg/dL — ABNORMAL HIGH (ref 0.44–1.00)
GFR calc non Af Amer: 20 mL/min — ABNORMAL LOW (ref >60)
GFR, EST AFRICAN AMERICAN: 23 mL/min — AB (ref >60)
Glucose, Bld: 258 mg/dL — ABNORMAL HIGH (ref 70–99)
POTASSIUM: 6.6 mmol/L — AB (ref 3.5–5.1)
SODIUM: 141 mmol/L (ref 135–145)
Total Bilirubin: 1.2 mg/dL (ref 0.3–1.2)
Total Protein: 6.6 g/dL (ref 6.5–8.1)

## 2018-02-01 LAB — CBC WITH DIFFERENTIAL/PLATELET
BASOS PCT: 0 %
Basophils Absolute: 0.1 10*3/uL (ref 0.0–0.1)
EOS ABS: 0 10*3/uL (ref 0.0–0.7)
Eosinophils Relative: 0 %
HCT: 21.2 % — ABNORMAL LOW (ref 36.0–46.0)
Hemoglobin: 6.7 g/dL — CL (ref 12.0–15.0)
LYMPHS ABS: 1.7 10*3/uL (ref 0.7–4.0)
Lymphocytes Relative: 7 %
MCH: 31.6 pg (ref 26.0–34.0)
MCHC: 31.6 g/dL (ref 30.0–36.0)
MCV: 100 fL (ref 78.0–100.0)
MONO ABS: 1.4 10*3/uL (ref 0.1–1.0)
MONOS PCT: 6 %
Neutro Abs: 21.1 10*3/uL (ref 1.7–7.7)
Neutrophils Relative %: 87 %
PLATELETS: 466 10*3/uL — AB (ref 150–400)
RBC: 2.12 MIL/uL — ABNORMAL LOW (ref 3.87–5.11)
RDW: 16.3 % — AB (ref 11.5–15.5)
WBC: 24.2 10*3/uL — ABNORMAL HIGH (ref 4.0–10.5)

## 2018-02-01 LAB — I-STAT CHEM 8, ED
BUN: 44 mg/dL — AB (ref 6–20)
CALCIUM ION: 1.24 mmol/L (ref 1.15–1.40)
CHLORIDE: 107 mmol/L (ref 98–111)
CREATININE: 2.6 mg/dL — AB (ref 0.44–1.00)
Glucose, Bld: 243 mg/dL — ABNORMAL HIGH (ref 70–99)
HCT: 19 % — ABNORMAL LOW (ref 36.0–46.0)
Hemoglobin: 6.5 g/dL — CL (ref 12.0–15.0)
Potassium: 6.3 mmol/L (ref 3.5–5.1)
Sodium: 137 mmol/L (ref 135–145)
TCO2: 19 mmol/L — AB (ref 22–32)

## 2018-02-01 LAB — POC OCCULT BLOOD, ED: FECAL OCCULT BLD: NEGATIVE

## 2018-02-01 LAB — LIPASE, BLOOD: Lipase: 57 U/L — ABNORMAL HIGH (ref 11–51)

## 2018-02-01 LAB — PROTIME-INR
INR: 1.95
Prothrombin Time: 22.1 seconds — ABNORMAL HIGH (ref 11.4–15.2)

## 2018-02-01 LAB — PREPARE RBC (CROSSMATCH)

## 2018-02-01 MED ORDER — GABAPENTIN 300 MG PO CAPS: 300.0000 mg | ORAL_CAPSULE | Freq: Every day | ORAL | Status: DC

## 2018-02-01 MED ORDER — METOPROLOL TARTRATE 5 MG/5ML IV SOLN: 5.0000 mg | Freq: Four times a day (QID) | INTRAVENOUS | Status: DC | PRN

## 2018-02-01 MED ORDER — MENTHOL 3 MG MT LOZG: 1.0000 | LOZENGE | OROMUCOSAL | Status: DC | PRN

## 2018-02-01 MED ORDER — TRIAMCINOLONE ACETONIDE 55 MCG/ACT NA AERO: 2.0000 | INHALATION_SPRAY | Freq: Every day | NASAL | Status: DC | PRN

## 2018-02-01 MED ORDER — TRAMADOL HCL 50 MG PO TABS: 50.0000 mg | ORAL_TABLET | Freq: Four times a day (QID) | ORAL | Status: DC | PRN

## 2018-02-01 MED ORDER — PANTOPRAZOLE SODIUM 40 MG PO TBEC: 40.0000 mg | DELAYED_RELEASE_TABLET | Freq: Every day | ORAL | Status: DC

## 2018-02-01 MED ORDER — PIPERACILLIN-TAZOBACTAM 3.375 G IVPB 30 MIN
3.3750 g | INTRAVENOUS | Status: AC
  Administered 2018-02-02: 3.375 g via INTRAVENOUS
  Filled 2018-02-01: qty 50

## 2018-02-01 MED ORDER — ALUM & MAG HYDROXIDE-SIMETH 200-200-20 MG/5ML PO SUSP: 30.0000 mL | Freq: Four times a day (QID) | ORAL | Status: DC | PRN

## 2018-02-01 MED ORDER — PROCHLORPERAZINE EDISYLATE 10 MG/2ML IJ SOLN: 5.0000 mg | INTRAMUSCULAR | Status: DC | PRN

## 2018-02-01 MED ORDER — LIP MEDEX EX OINT: 1.0000 "application " | TOPICAL_OINTMENT | Freq: Two times a day (BID) | CUTANEOUS | Status: DC

## 2018-02-01 MED ORDER — SODIUM CHLORIDE 0.9 % IV BOLUS: 1000.0000 mL | Freq: Three times a day (TID) | INTRAVENOUS | Status: DC | PRN

## 2018-02-01 MED ORDER — LORATADINE 10 MG PO TABS: 10.0000 mg | ORAL_TABLET | Freq: Every day | ORAL | Status: DC

## 2018-02-01 MED ORDER — HYDROMORPHONE HCL 1 MG/ML IJ SOLN: 0.5000 mg | INTRAMUSCULAR | Status: DC | PRN

## 2018-02-01 MED ORDER — ONDANSETRON HCL 4 MG/2ML IJ SOLN
4.0000 mg | Freq: Four times a day (QID) | INTRAMUSCULAR | Status: DC | PRN
  Administered 2018-02-02 – 2018-02-03 (×3): 4 mg via INTRAVENOUS
  Filled 2018-02-01 (×2): qty 2

## 2018-02-01 MED ORDER — SODIUM CHLORIDE 0.9 % IV SOLN
8.0000 mg | Freq: Four times a day (QID) | INTRAVENOUS | Status: DC | PRN
  Filled 2018-02-01: qty 4

## 2018-02-01 MED ORDER — MONTELUKAST SODIUM 10 MG PO TABS: 10.0000 mg | ORAL_TABLET | Freq: Every day | ORAL | Status: DC

## 2018-02-01 MED ORDER — OXYCODONE HCL 5 MG PO TABS: 5.0000 mg | ORAL_TABLET | ORAL | Status: DC | PRN

## 2018-02-01 MED ORDER — SODIUM CHLORIDE 0.9 % IV SOLN
10.0000 mL/h | Freq: Once | INTRAVENOUS | Status: AC
  Administered 2018-02-01: 10 mL/h via INTRAVENOUS

## 2018-02-01 MED ORDER — ONDANSETRON HCL 4 MG/2ML IJ SOLN
4.0000 mg | Freq: Once | INTRAMUSCULAR | Status: AC
  Administered 2018-02-01: 4 mg via INTRAVENOUS
  Filled 2018-02-01: qty 2

## 2018-02-01 MED ORDER — BUPROPION HCL ER (XL) 150 MG PO TB24: 300.0000 mg | ORAL_TABLET | Freq: Every day | ORAL | Status: DC

## 2018-02-01 MED ORDER — ALLOPURINOL 300 MG PO TABS: 300.0000 mg | ORAL_TABLET | Freq: Every day | ORAL | Status: DC

## 2018-02-01 MED ORDER — HYDROCORTISONE 1 % EX CREA: 1.0000 "application " | TOPICAL_CREAM | Freq: Three times a day (TID) | CUTANEOUS | Status: DC | PRN

## 2018-02-01 MED ORDER — CITALOPRAM HYDROBROMIDE 10 MG PO TABS: 20.0000 mg | ORAL_TABLET | Freq: Every day | ORAL | Status: DC

## 2018-02-01 MED ORDER — MOMETASONE FURO-FORMOTEROL FUM 100-5 MCG/ACT IN AERO
2.0000 | INHALATION_SPRAY | Freq: Two times a day (BID) | RESPIRATORY_TRACT | Status: DC
  Administered 2018-02-02 – 2018-02-03 (×2): 2 via RESPIRATORY_TRACT
  Filled 2018-02-01: qty 8.8

## 2018-02-01 MED ORDER — MAGIC MOUTHWASH: 15.0000 mL | Freq: Four times a day (QID) | ORAL | Status: DC | PRN

## 2018-02-01 MED ORDER — GUAIFENESIN-DM 100-10 MG/5ML PO SYRP: 10.0000 mL | ORAL_SOLUTION | ORAL | Status: DC | PRN

## 2018-02-01 MED ORDER — ACETAMINOPHEN 500 MG PO TABS: 1000.0000 mg | ORAL_TABLET | Freq: Three times a day (TID) | ORAL | Status: DC

## 2018-02-01 MED ORDER — SODIUM CHLORIDE 0.9 % IV BOLUS
1000.0000 mL | Freq: Once | INTRAVENOUS | Status: AC
  Administered 2018-02-01: 1000 mL via INTRAVENOUS

## 2018-02-01 MED ORDER — ALBUTEROL SULFATE (2.5 MG/3ML) 0.083% IN NEBU
10.0000 mg | INHALATION_SOLUTION | Freq: Once | RESPIRATORY_TRACT | Status: AC
  Administered 2018-02-01: 10 mg via RESPIRATORY_TRACT
  Filled 2018-02-01: qty 12

## 2018-02-01 MED ORDER — METHOCARBAMOL 1000 MG/10ML IJ SOLN
1000.0000 mg | Freq: Four times a day (QID) | INTRAVENOUS | Status: DC | PRN
  Filled 2018-02-01: qty 10

## 2018-02-01 MED ORDER — PHENOL 1.4 % MT LIQD: 1.0000 | OROMUCOSAL | Status: DC | PRN

## 2018-02-01 MED ORDER — PRAVASTATIN SODIUM 20 MG PO TABS: 80.0000 mg | ORAL_TABLET | Freq: Every day | ORAL | Status: DC

## 2018-02-01 MED ORDER — HYDROCORTISONE 2.5 % RE CREA: 1.0000 "application " | TOPICAL_CREAM | Freq: Four times a day (QID) | RECTAL | Status: DC | PRN

## 2018-02-01 MED ORDER — MORPHINE SULFATE (PF) 2 MG/ML IV SOLN
2.0000 mg | Freq: Once | INTRAVENOUS | Status: AC
  Administered 2018-02-01: 2 mg via INTRAVENOUS
  Filled 2018-02-01: qty 1

## 2018-02-01 MED ORDER — INSULIN ASPART 100 UNIT/ML ~~LOC~~ SOLN
5.0000 [IU] | Freq: Once | SUBCUTANEOUS | Status: AC
  Administered 2018-02-02: 5 [IU] via SUBCUTANEOUS

## 2018-02-01 NOTE — Progress Notes (Signed)
A consult was received from an ED physician for zosyn per pharmacy dosing.  The patient's profile has been reviewed for ht/wt/allergies/indication/available labs.   A one time order has been placed for Zosyn 3.375 Gm.  Further antibiotics/pharmacy consults should be ordered by admitting physician if indicated.                       Thank you, Dorrene German 02/01/2018  10:35 PM

## 2018-02-01 NOTE — Progress Notes (Addendum)
[name] [id number] [birth year]  CARE TEAM:  PCP: [name], [place]  Outpatient Care Team: Patient Care Team: [name], [place] as PCP - General (Nurse Practitioner) Belva Crome, MD as PCP - Cardiology (Cardiology) Clance, Armando Reichert, MD as Consulting Physician (Pulmonary Disease) Greer Pickerel, MD as Consulting Physician (General Surgery)  Inpatient Treatment Team: Treatment Team: Attending Provider: Davonna Belling, MD; Registered Nurse: Susette Racer, RN; Physician Assistant: Bishop Dublin; Consulting Physician: Greer Pickerel, MD   Problem List:   Active Problems:   Chronic asthmatic bronchitis [facility])   History of mitral valve replacement with mechanical valve   CKD (chronic kidney disease) stage 3, GFR 30-59 ml/min (HCC)   Chronic respiratory failure ([place])   Obstructive sleep apnea   Morbid obesity with BMI of 50.0-59.9, adult ([place])   Chronic acquired lymphedema - Right lower extremity   Hx of mechanical aortic valve replacement   Acute renal failure superimposed on stage 3 chronic kidney disease ([place])   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  Significant intraperitoneal and abdominal wall hematomas most likely due to being doubly anticoagulated with Lovenox bridge and nearly therapeutic INR.  Plan:  -Medical admission.  Transfusion.  Hold anticoagulation.  If hemoglobin does not improve with transfusion of 2 units, consider more aggressive reversal with vitamin K and/or FFP if needed.  Hopefully not as likely.  Discussed with internal medicine.  Asked them to admit to help sort things out.  No easy answer to patient with double mechanical valves that would benefit from anticoagulation yet is having persistent bleeding/oozing problems postoperatively.  There is no evidence of leak or abscess  or wound infection which is encouraging.  Bariatric clear liquids as tolerated.  We will have initial bariatric surgeon and bariatric team to help follow-up in the morning to help sort things out.  Most likely will benefit from some intermittent diuresis as well.  Consider reconsultation with nephrology to avoid over diuresing yet avoid fluid overload and risks of pulmonary and heart failure issues as well.  Hopefully the needle can be threaded.  Hopefully these are temporary annoyances and as the intraperitoneal and abdominal wall hematomas resolve, her hemoglobin will improve and she will rally.  Most likely would benefit from cardiology reevaluation to see when it is safe to re-anticoagulate.  My instinct would be that if her hemoglobin stable for 48 hours after transfusion to the appropriate level, could consider re-anticoagulation.  However, that was the plan last time & here we are again.  Hopefully risk of bleeding should lessen as she gets further out fro surgery  VTE prophylaxis- SCDs, etc  Mobilize as tolerated to help recovery  30 minutes spent in review, evaluation, examination, counseling, and coordination of care.  More than 50% of that time was spent in counseling.  I updated the patient's status to the patient, her sister, and admitting medicine physician, Dr Denton Brick..  Recommendations were made.  Questions were answered.  They expressed understanding & appreciation.   02/01/2018    Subjective: (Chief complaint)  Sore.  Increased abdominal swelling.  Inadequate pain control.  Other daughter concerned.  Brought to emergency room.  Anemic with rising creatinine.  Surgical evaluation requested by urgency medicine  Objective:  Vital signs:  Vitals:   02/01/18 2026 02/01/18 2028 02/01/18 2056  BP: (!) 105/54  (S) 107/60  Pulse: 95  (S) 97  Resp: (!) 33  (S) (!) 24  Temp: 97.7 F (36.5 C)    TempSrc: Oral    SpO2: 93%  (S) 96%  Weight:  (!) 154.2 kg   Height:  _0   (1.651 m)        Intake/Output   Yesterday:  No intake/output data recorded. This shift:  No intake/output data recorded.  Bowel function:  Flatus: YES  BM:  YES  Drain: (No drain)   Physical Exam:  General: Pt awake/alert/oriented x4 in moderate acute distress Eyes: PERRL, normal EOM.  Sclera clear.  No icterus Neuro: CN II-XII intact w/o focal sensory/motor deficits. Lymph: No head/neck/groin lymphadenopathy Psych:  No delerium/psychosis/paranoia HENT: Normocephalic, Mucus membranes moist.  No thrush Neck: Supple, No tracheal deviation Chest: No chest wall pain w good excursion CV:  Pulses intact.  Regular rhythm MS: Normal AROM mjr joints.  No obvious deformity  Abdomen: Somewhat firm.  Mildy distended.  Significant ecchymosis and some firmness in the soft tissues and due to that and hematoma.  No wound infection abscess or bleeding.  Some abdominal discomfort but no evidence of peritonitis.  No incarcerated hernias.  Ext:   No deformity.  Right sided lymphedema stable on lower extremity.  No cyanosis Skin: No petechiae / purpura  Results:   Labs: Results for orders placed or performed during the hospital encounter of 02/01/18 (from the past 48 hour(s))  CBC with Differential     Status: Abnormal   Collection Time: 02/01/18  8:56 PM  Result Value Ref Range   WBC 24.2 (H) 4.0 - 10.5 K/uL   RBC 2.12 (L) 3.87 - 5.11 MIL/uL   Hemoglobin 6.7 (LL) 12.0 - 15.0 g/dL    Comment: REPEATED TO VERIFY CRITICAL RESULT CALLED TO, READ BACK BY AND VERIFIED WITH: A MARQUEZ RN 2153 02/01/18 A NAVARRO DELTA CHECK NOTED    HCT 21.2 (L) 36.0 - 46.0 %   MCV 100.0 78.0 - 100.0 fL   MCH 31.6 26.0 - 34.0 pg   MCHC 31.6 30.0 - 36.0 g/dL   RDW 16.3 (H) 11.5 - 15.5 %   Platelets 466 (H) 150 - 400 K/uL   Neutrophils Relative % 87 %   Neutro Abs 21.1 1.7 - 7.7 K/uL   Lymphocytes Relative 7 %   Lymphs Abs 1.7 0.7 - 4.0 K/uL   Monocytes Relative 6 %   Monocytes Absolute 1.4 0.1 - 1.0  K/uL   Eosinophils Relative 0 %   Eosinophils Absolute 0.0 0.0 - 0.7 K/uL   Basophils Relative 0 %   Basophils Absolute 0.1 0.0 - 0.1 K/uL   WBC Morphology MILD LEFT SHIFT (1-5% METAS, OCC MYELO, OCC BANDS)    RBC Morphology POLYCHROMASIA PRESENT     Comment: RARE NRBCs Performed at [facility], [place] [address], [place], [place] [address]   Comprehensive metabolic panel     Status: Abnormal   Collection Time: 02/01/18  8:56 PM  Result Value Ref Range   Sodium 141 135 - 145 mmol/L   Potassium 6.6 (HH) 3.5 - 5.1 mmol/L    Comment: DELTA CHECK NOTED NO VISIBLE HEMOLYSIS CRITICAL RESULT CALLED TO, READ BACK BY AND VERIFIED WITH: MARQUEZ,A RN AT 2202 02/01/18 BY TIBBITTS,K    Chloride 108 98 - 111 mmol/L   CO2 20 (L) 22 - 32 mmol/L   Glucose, Bld 258 (H) 70 - 99 mg/dL   BUN 47 (  H) 6 - 20 mg/dL   Creatinine, Ser 2.53 (H) 0.44 - 1.00 mg/dL    Comment: DELTA CHECK NOTED   Calcium 9.4 8.9 - 10.3 mg/dL   Total Protein 6.6 6.5 - 8.1 g/dL   Albumin 3.2 (L) 3.5 - 5.0 g/dL   AST 36 15 - 41 U/L   ALT 26 0 - 44 U/L   Alkaline Phosphatase 59 38 - 126 U/L   Total Bilirubin 1.2 0.3 - 1.2 mg/dL   GFR calc non Af Amer 20 (L) >60 mL/min   GFR calc Af Amer 23 (L) >60 mL/min    Comment: (NOTE) The eGFR has been calculated using the CKD EPI equation. This calculation has not been validated in all clinical situations. eGFR's persistently <60 mL/min signify possible Chronic Kidney Disease.    Anion gap 13 5 - 15    Comment: Performed at [facility], [place] [address] [place], [place] [address]  Lipase, blood     Status: Abnormal   Collection Time: 02/01/18  8:56 PM  Result Value Ref Range   Lipase 57 (H) 11 - 51 U/L    Comment: Performed at [facility], [place] [address] [place], [place] [address]  I-Stat Chem 8, ED     Status: Abnormal   Collection Time: 02/01/18  9:02 PM  Result Value Ref Range   Sodium 137 135 - 145 mmol/L   Potassium  6.3 (HH) 3.5 - 5.1 mmol/L   Chloride 107 98 - 111 mmol/L   BUN 44 (H) 6 - 20 mg/dL   Creatinine, Ser 2.60 (H) 0.44 - 1.00 mg/dL   Glucose, Bld 243 (H) 70 - 99 mg/dL   Calcium, Ion 1.24 1.15 - 1.40 mmol/L   TCO2 19 (L) 22 - 32 mmol/L   Hemoglobin 6.5 (LL) 12.0 - 15.0 g/dL   HCT 19.0 (L) 36.0 - 46.0 %   Comment NOTIFIED PHYSICIAN   Type and screen Lydia     Status: None (Preliminary result)   Collection Time: 02/01/18  9:13 PM  Result Value Ref Range   ABO/RH(D) O POS    Antibody Screen NEG    Sample Expiration 02/04/2018    Unit Number [id number]    Blood Component Type RED CELLS,LR    Unit division 00    Status of Unit ISSUED    Transfusion Status OK TO TRANSFUSE    Crossmatch Result      Compatible Performed at [facility], [place] [address] [place], [place] [address]    Unit Number [id number]    Blood Component Type RED CELLS,LR    Unit division 00    Status of Unit ALLOCATED    Transfusion Status OK TO TRANSFUSE    Crossmatch Result Compatible   POC occult blood, ED     Status: None   Collection Time: 02/01/18  9:21 PM  Result Value Ref Range   Fecal Occult Bld NEGATIVE NEGATIVE  Protime-INR     Status: Abnormal   Collection Time: 02/01/18  9:21 PM  Result Value Ref Range   Prothrombin Time 22.1 (H) 11.4 - 15.2 seconds   INR 1.95     Comment: Performed at [facility], [place] [address] [place], [place] [address]  Prepare RBC     Status: None   Collection Time: 02/01/18  9:56 PM  Result Value Ref Range   Order Confirmation      ORDER PROCESSED BY BLOOD BANK Performed  at [facility], [place] [address], [place], [place] [address]     Imaging / Studies: Ct Abdomen Pelvis Wo Contrast  Result Date: 02/01/2018 CLINICAL DATA:  57 year old female status post gastric sleeve procedure last Tuesday presents with abdominal pain, distention and diarrhea. Bruising along the abdominal. Patient  is pale and there is concern for retroperitoneal hemorrhage. EXAM: CT ABDOMEN AND PELVIS WITHOUT CONTRAST TECHNIQUE: Multidetector CT imaging of the abdomen and pelvis was performed following the standard protocol without IV contrast. COMPARISON:  None. FINDINGS: Assessment of solid organ injury/laceration is limited due to lack of IV contrast. Lower chest: Heart size is enlarged without pericardial effusion. Evidence of prior median sternotomy and mitral valvular repair of the included heart. Subsegmental atelectasis is seen at the lung bases and/or scarring. No effusion or pneumothorax. Hepatobiliary: The unenhanced liver is unremarkable. No biliary dilatation. Nondistended gallbladder free of stones. Hyperdense appearance of the bile within the gallbladder may be secondary to hemolysis. No subcapsular fluid about the liver. Pancreas: Normal Spleen: Normal.  No subcapsular fluid. Adrenals/Urinary Tract: Normal bilateral adrenal glands. Small rounded cortical hyperdensities may represent tiny proteinaceous or hemorrhagic cysts. No nephrolithiasis nor obstructive uropathy. The urinary bladder is unremarkable. Stomach/Bowel: Staple line across the stomach from gastric sleeve procedure. No adjacent pneumoperitoneum or evidence of staple line dehiscence. Normal small bowel rotation. No bowel obstruction. The appendix is not confidently identified. Vascular/Lymphatic: Mild aortic atherosclerosis without aneurysm. No lymphadenopathy. Reproductive: Retroverted appearing uterus.  No adnexal mass. Other: Localized hematomas are identified in the left upper quadrant of the abdomen with hematocrit levels ranging in 2.7 cm and 2.9 cm in the subdiaphragmatic portion of the left upper quadrant with two larger hematomas measuring 8.3 x 7.5 x 8.3 cm on series 2/24 and similar-sized hematoma further caudad measuring 8.3 x 7.8 x 9.1 cm with demonstrable hematocrit level, series 2/45. Moderate to large volume of hemoperitoneum  without active hemorrhage is identified outlining the liver and spleen and extending into the pelvis along the paracolic gutters. Diffuse soft tissue induration and edema is noted of the abdominal and pelvic wall compatible with history of bruising. No retroperitoneal hemorrhage is identified. No free air is identified. No apparent abscess. Musculoskeletal: Thoracolumbar spondylosis extending to the L2 level with marked disc space narrowing and degenerative disc disease. Disc bulge L4-5. No acute nor aggressive osseous lesions. IMPRESSION: 1. Fluid-hematocrit levels are identified within contained omental or mesenteric hematomas in the left hemiabdomen the largest measuring approximately 8.3 x 7.8 x 9.1 cm. 2. Complex non simple fluid is also seen, more free-flowing within the abdomen overlying the liver and spleen and extending into the pelvis compatible with stigmata of hemoperitoneum. No active focus of hemorrhage is identified on this unenhanced study. 3. Soft tissue induration of the abdominopelvic wall likely representing edema or concordant with the patient's history of bruising. 4. No retroperitoneal hemorrhage. 5. Intact gastric sleeve staple line without dehiscence identified. No adjacent free air is noted. No abscess collections are seen. No bowel obstruction is noted. These results were called by telephone at the time of interpretation on 02/01/2018 at 10:15 pm to [place] , who verbally acknowledged these results. Electronically Signed   By: Ashley Royalty M.D.   On: 02/01/2018 22:15   Dg Chest Portable 1 View  Result Date: 02/01/2018 CLINICAL DATA:  Abdominal pain EXAM: PORTABLE CHEST 1 VIEW COMPARISON:  01/25/2018 FINDINGS: The heart is mildly enlarged. Stable vascular congestion. Lungs are under aerated with bibasilar atelectasis. Postoperative changes. No  pneumothorax. IMPRESSION: Stable cardiomegaly and vascular congestion. Electronically Signed   By: Marybelle Killings M.D.   On: 02/01/2018 21:46     Medications / Allergies: per chart  Antibiotics: Anti-infectives (From admission, onward)   Start     Dose/Rate Route Frequency Ordered Stop   02/01/18 2245  piperacillin-tazobactam (ZOSYN) IVPB 3.375 g     3.375 g 100 mL/hr over 30 Minutes Intravenous NOW 02/01/18 2234 02/02/18 2245        Note: Portions of this report may have been transcribed using voice recognition software. Every effort was made to ensure accuracy; however, inadvertent computerized transcription errors may be present.   Any transcriptional errors that result from this process are unintentional.     Adin Hector, MD, FACS, MASCRS Gastrointestinal and Minimally Invasive Surgery    [address] [place], [place] [address] [phone] Main / Paging [phone] Fax

## 2018-02-01 NOTE — ED Triage Notes (Signed)
Pt is brought in by EMS from home with c/o abdominal pain with distention and diarrhea, no nausea or vomiting.  Pt has had gastric bypass last Tuesday and was discharged home yesterday.  Pt denies any blood in her stools.
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
## 2018-02-02 ENCOUNTER — Inpatient Hospital Stay (HOSPITAL_COMMUNITY): Payer: Medicare Other

## 2018-02-02 ENCOUNTER — Other Ambulatory Visit: Payer: Self-pay

## 2018-02-02 ENCOUNTER — Encounter (HOSPITAL_COMMUNITY): Payer: Self-pay | Admitting: Nephrology

## 2018-02-02 DIAGNOSIS — S3692XA Contusion of unspecified intra-abdominal organ, initial encounter: Secondary | ICD-10-CM

## 2018-02-02 DIAGNOSIS — S36892A Contusion of other intra-abdominal organs, initial encounter: Secondary | ICD-10-CM | POA: Diagnosis present

## 2018-02-02 DIAGNOSIS — J9622 Acute and chronic respiratory failure with hypercapnia: Secondary | ICD-10-CM | POA: Diagnosis not present

## 2018-02-02 DIAGNOSIS — D500 Iron deficiency anemia secondary to blood loss (chronic): Secondary | ICD-10-CM

## 2018-02-02 DIAGNOSIS — Z7901 Long term (current) use of anticoagulants: Secondary | ICD-10-CM | POA: Diagnosis not present

## 2018-02-02 DIAGNOSIS — G4733 Obstructive sleep apnea (adult) (pediatric): Secondary | ICD-10-CM

## 2018-02-02 DIAGNOSIS — Z9229 Personal history of other drug therapy: Secondary | ICD-10-CM | POA: Diagnosis not present

## 2018-02-02 DIAGNOSIS — Z9981 Dependence on supplemental oxygen: Secondary | ICD-10-CM | POA: Diagnosis not present

## 2018-02-02 DIAGNOSIS — Z6841 Body Mass Index (BMI) 40.0 and over, adult: Secondary | ICD-10-CM | POA: Diagnosis not present

## 2018-02-02 DIAGNOSIS — Y848 Other medical procedures as the cause of abnormal reaction of the patient, or of later complication, without mention of misadventure at the time of the procedure: Secondary | ICD-10-CM | POA: Diagnosis present

## 2018-02-02 DIAGNOSIS — E875 Hyperkalemia: Secondary | ICD-10-CM | POA: Diagnosis present

## 2018-02-02 DIAGNOSIS — J9690 Respiratory failure, unspecified, unspecified whether with hypoxia or hypercapnia: Secondary | ICD-10-CM | POA: Diagnosis not present

## 2018-02-02 DIAGNOSIS — R58 Hemorrhage, not elsewhere classified: Secondary | ICD-10-CM | POA: Diagnosis not present

## 2018-02-02 DIAGNOSIS — J9621 Acute and chronic respiratory failure with hypoxia: Secondary | ICD-10-CM | POA: Diagnosis not present

## 2018-02-02 DIAGNOSIS — N183 Chronic kidney disease, stage 3 (moderate): Secondary | ICD-10-CM | POA: Diagnosis not present

## 2018-02-02 DIAGNOSIS — R578 Other shock: Secondary | ICD-10-CM | POA: Diagnosis present

## 2018-02-02 DIAGNOSIS — D631 Anemia in chronic kidney disease: Secondary | ICD-10-CM | POA: Diagnosis not present

## 2018-02-02 DIAGNOSIS — J449 Chronic obstructive pulmonary disease, unspecified: Secondary | ICD-10-CM | POA: Diagnosis present

## 2018-02-02 DIAGNOSIS — Z9884 Bariatric surgery status: Secondary | ICD-10-CM | POA: Diagnosis not present

## 2018-02-02 DIAGNOSIS — Z952 Presence of prosthetic heart valve: Secondary | ICD-10-CM | POA: Diagnosis not present

## 2018-02-02 DIAGNOSIS — I351 Nonrheumatic aortic (valve) insufficiency: Secondary | ICD-10-CM | POA: Diagnosis not present

## 2018-02-02 DIAGNOSIS — K661 Hemoperitoneum: Secondary | ICD-10-CM | POA: Diagnosis not present

## 2018-02-02 DIAGNOSIS — R918 Other nonspecific abnormal finding of lung field: Secondary | ICD-10-CM | POA: Diagnosis not present

## 2018-02-02 DIAGNOSIS — D589 Hereditary hemolytic anemia, unspecified: Secondary | ICD-10-CM

## 2018-02-02 DIAGNOSIS — R0902 Hypoxemia: Secondary | ICD-10-CM | POA: Diagnosis not present

## 2018-02-02 DIAGNOSIS — R509 Fever, unspecified: Secondary | ICD-10-CM | POA: Diagnosis not present

## 2018-02-02 DIAGNOSIS — D62 Acute posthemorrhagic anemia: Secondary | ICD-10-CM | POA: Diagnosis present

## 2018-02-02 DIAGNOSIS — Z452 Encounter for adjustment and management of vascular access device: Secondary | ICD-10-CM | POA: Diagnosis not present

## 2018-02-02 DIAGNOSIS — I890 Lymphedema, not elsewhere classified: Secondary | ICD-10-CM | POA: Diagnosis not present

## 2018-02-02 DIAGNOSIS — R1010 Upper abdominal pain, unspecified: Secondary | ICD-10-CM | POA: Diagnosis not present

## 2018-02-02 DIAGNOSIS — Z87891 Personal history of nicotine dependence: Secondary | ICD-10-CM | POA: Diagnosis not present

## 2018-02-02 DIAGNOSIS — E781 Pure hyperglyceridemia: Secondary | ICD-10-CM | POA: Diagnosis present

## 2018-02-02 DIAGNOSIS — N189 Chronic kidney disease, unspecified: Secondary | ICD-10-CM

## 2018-02-02 DIAGNOSIS — R0602 Shortness of breath: Secondary | ICD-10-CM | POA: Diagnosis not present

## 2018-02-02 DIAGNOSIS — K91870 Postprocedural hematoma of a digestive system organ or structure following a digestive system procedure: Secondary | ICD-10-CM | POA: Diagnosis present

## 2018-02-02 DIAGNOSIS — I5033 Acute on chronic diastolic (congestive) heart failure: Secondary | ICD-10-CM | POA: Diagnosis present

## 2018-02-02 DIAGNOSIS — E785 Hyperlipidemia, unspecified: Secondary | ICD-10-CM | POA: Diagnosis present

## 2018-02-02 DIAGNOSIS — I959 Hypotension, unspecified: Secondary | ICD-10-CM | POA: Diagnosis not present

## 2018-02-02 DIAGNOSIS — J9600 Acute respiratory failure, unspecified whether with hypoxia or hypercapnia: Secondary | ICD-10-CM | POA: Diagnosis not present

## 2018-02-02 DIAGNOSIS — J849 Interstitial pulmonary disease, unspecified: Secondary | ICD-10-CM | POA: Diagnosis not present

## 2018-02-02 DIAGNOSIS — D649 Anemia, unspecified: Secondary | ICD-10-CM

## 2018-02-02 DIAGNOSIS — G9341 Metabolic encephalopathy: Secondary | ICD-10-CM | POA: Diagnosis not present

## 2018-02-02 DIAGNOSIS — R011 Cardiac murmur, unspecified: Secondary | ICD-10-CM | POA: Diagnosis not present

## 2018-02-02 DIAGNOSIS — S36892D Contusion of other intra-abdominal organs, subsequent encounter: Secondary | ICD-10-CM | POA: Diagnosis not present

## 2018-02-02 DIAGNOSIS — Z79899 Other long term (current) drug therapy: Secondary | ICD-10-CM | POA: Diagnosis not present

## 2018-02-02 DIAGNOSIS — IMO0002 Reserved for concepts with insufficient information to code with codable children: Secondary | ICD-10-CM | POA: Insufficient documentation

## 2018-02-02 DIAGNOSIS — I4892 Unspecified atrial flutter: Secondary | ICD-10-CM | POA: Diagnosis not present

## 2018-02-02 DIAGNOSIS — Z5309 Procedure and treatment not carried out because of other contraindication: Secondary | ICD-10-CM | POA: Diagnosis not present

## 2018-02-02 DIAGNOSIS — J9811 Atelectasis: Secondary | ICD-10-CM | POA: Diagnosis not present

## 2018-02-02 DIAGNOSIS — R109 Unspecified abdominal pain: Secondary | ICD-10-CM | POA: Insufficient documentation

## 2018-02-02 DIAGNOSIS — N179 Acute kidney failure, unspecified: Secondary | ICD-10-CM | POA: Diagnosis present

## 2018-02-02 DIAGNOSIS — D72829 Elevated white blood cell count, unspecified: Secondary | ICD-10-CM | POA: Diagnosis not present

## 2018-02-02 DIAGNOSIS — I5031 Acute diastolic (congestive) heart failure: Secondary | ICD-10-CM | POA: Diagnosis not present

## 2018-02-02 DIAGNOSIS — Z903 Acquired absence of stomach [part of]: Secondary | ICD-10-CM | POA: Diagnosis not present

## 2018-02-02 DIAGNOSIS — R9389 Abnormal findings on diagnostic imaging of other specified body structures: Secondary | ICD-10-CM | POA: Diagnosis not present

## 2018-02-02 DIAGNOSIS — I130 Hypertensive heart and chronic kidney disease with heart failure and stage 1 through stage 4 chronic kidney disease, or unspecified chronic kidney disease: Secondary | ICD-10-CM | POA: Diagnosis not present

## 2018-02-02 DIAGNOSIS — S3692XD Contusion of unspecified intra-abdominal organ, subsequent encounter: Secondary | ICD-10-CM | POA: Diagnosis not present

## 2018-02-02 DIAGNOSIS — Z7951 Long term (current) use of inhaled steroids: Secondary | ICD-10-CM | POA: Diagnosis not present

## 2018-02-02 LAB — CBC WITH DIFFERENTIAL/PLATELET
BASOS ABS: 0.1 10*3/uL (ref 0.0–0.1)
BASOS PCT: 0 %
EOS ABS: 0 10*3/uL (ref 0.0–0.7)
EOS PCT: 0 %
HCT: 23.2 % — ABNORMAL LOW (ref 36.0–46.0)
Hemoglobin: 7.7 g/dL — ABNORMAL LOW (ref 12.0–15.0)
Lymphocytes Relative: 5 %
Lymphs Abs: 1.4 10*3/uL (ref 0.7–4.0)
MCH: 30.9 pg (ref 26.0–34.0)
MCHC: 33.2 g/dL (ref 30.0–36.0)
MCV: 93.2 fL (ref 78.0–100.0)
MONO ABS: 3.5 10*3/uL (ref 0.1–1.0)
Monocytes Relative: 13 %
Neutro Abs: 22.8 10*3/uL (ref 1.7–7.7)
Neutrophils Relative %: 82 %
Platelets: 398 10*3/uL (ref 150–400)
RBC: 2.49 MIL/uL — AB (ref 3.87–5.11)
RDW: 16.9 % — AB (ref 11.5–15.5)
WBC: 27.8 10*3/uL — AB (ref 4.0–10.5)

## 2018-02-02 LAB — CBC
HCT: 20.9 % — ABNORMAL LOW (ref 36.0–46.0)
Hemoglobin: 6.8 g/dL — CL (ref 12.0–15.0)
MCH: 31.1 pg (ref 26.0–34.0)
MCHC: 32.5 g/dL (ref 30.0–36.0)
MCV: 95.4 fL (ref 78.0–100.0)
Platelets: 392 10*3/uL (ref 150–400)
RBC: 2.19 MIL/uL — ABNORMAL LOW (ref 3.87–5.11)
RDW: 17.4 % — ABNORMAL HIGH (ref 11.5–15.5)
WBC: 27.5 10*3/uL — ABNORMAL HIGH (ref 4.0–10.5)

## 2018-02-02 LAB — URINALYSIS, ROUTINE W REFLEX MICROSCOPIC
BILIRUBIN URINE: NEGATIVE
Bilirubin Urine: NEGATIVE
Glucose, UA: NEGATIVE mg/dL
Glucose, UA: NEGATIVE mg/dL
Hgb urine dipstick: NEGATIVE
Hgb urine dipstick: NEGATIVE
KETONES UR: NEGATIVE mg/dL
Ketones, ur: NEGATIVE mg/dL
LEUKOCYTES UA: NEGATIVE
NITRITE: NEGATIVE
Nitrite: NEGATIVE
PH: 5 (ref 5.0–8.0)
Protein, ur: NEGATIVE mg/dL
Protein, ur: NEGATIVE mg/dL
SPECIFIC GRAVITY, URINE: 1.024 (ref 1.005–1.030)
SPECIFIC GRAVITY, URINE: 1.012 (ref 1.005–1.030)
pH: 5 (ref 5.0–8.0)

## 2018-02-02 LAB — BASIC METABOLIC PANEL
ANION GAP: 12 (ref 5–15)
Anion gap: 9 (ref 5–15)
BUN: 55 mg/dL — ABNORMAL HIGH (ref 6–20)
BUN: 65 mg/dL — AB (ref 6–20)
CO2: 24 mmol/L (ref 22–32)
CO2: 25 mmol/L (ref 22–32)
CREATININE: 2.99 mg/dL — AB (ref 0.44–1.00)
Calcium: 9 mg/dL (ref 8.9–10.3)
Calcium: 9.4 mg/dL (ref 8.9–10.3)
Chloride: 108 mmol/L (ref 98–111)
Chloride: 105 mmol/L (ref 98–111)
Creatinine, Ser: 3.34 mg/dL — ABNORMAL HIGH (ref 0.44–1.00)
GFR calc Af Amer: 19 mL/min — ABNORMAL LOW (ref >60)
GFR calc Af Amer: 16 mL/min — ABNORMAL LOW (ref >60)
GFR calc non Af Amer: 16 mL/min — ABNORMAL LOW (ref >60)
GFR calc non Af Amer: 14 mL/min — ABNORMAL LOW (ref >60)
GLUCOSE: 177 mg/dL — AB (ref 70–99)
Glucose, Bld: 166 mg/dL — ABNORMAL HIGH (ref 70–99)
POTASSIUM: 6.5 mmol/L — AB (ref 3.5–5.1)
Potassium: 6.7 mmol/L (ref 3.5–5.1)
SODIUM: 142 mmol/L (ref 135–145)
Sodium: 141 mmol/L (ref 135–145)

## 2018-02-02 LAB — RENAL FUNCTION PANEL
ALBUMIN: 3.2 g/dL — AB (ref 3.5–5.0)
ANION GAP: 13 (ref 5–15)
BUN: 58 mg/dL — AB (ref 6–20)
CALCIUM: 9.3 mg/dL (ref 8.9–10.3)
CO2: 23 mmol/L (ref 22–32)
Chloride: 106 mmol/L (ref 98–111)
Creatinine, Ser: 3.02 mg/dL — ABNORMAL HIGH (ref 0.44–1.00)
GFR calc Af Amer: 19 mL/min — ABNORMAL LOW (ref >60)
GFR calc non Af Amer: 16 mL/min — ABNORMAL LOW (ref >60)
Glucose, Bld: 164 mg/dL — ABNORMAL HIGH (ref 70–99)
PHOSPHORUS: 6.5 mg/dL — AB (ref 2.5–4.6)
Potassium: 6.7 mmol/L (ref 3.5–5.1)
SODIUM: 142 mmol/L (ref 135–145)

## 2018-02-02 LAB — GLUCOSE, CAPILLARY
GLUCOSE-CAPILLARY: 170 mg/dL — AB (ref 70–99)
GLUCOSE-CAPILLARY: 174 mg/dL — AB (ref 70–99)
Glucose-Capillary: 163 mg/dL — ABNORMAL HIGH (ref 70–99)
Glucose-Capillary: 149 mg/dL — ABNORMAL HIGH (ref 70–99)
Glucose-Capillary: 151 mg/dL — ABNORMAL HIGH (ref 70–99)
Glucose-Capillary: 153 mg/dL — ABNORMAL HIGH (ref 70–99)
Glucose-Capillary: 149 mg/dL — ABNORMAL HIGH (ref 70–99)

## 2018-02-02 LAB — HIV ANTIBODY (ROUTINE TESTING W REFLEX): HIV Screen 4th Generation wRfx: NONREACTIVE

## 2018-02-02 LAB — PROTIME-INR
INR: 2.08
Prothrombin Time: 23.3 seconds — ABNORMAL HIGH (ref 11.4–15.2)

## 2018-02-02 LAB — NA AND K (SODIUM & POTASSIUM), RAND UR
POTASSIUM UR: 42 mmol/L
SODIUM UR: 79 mmol/L

## 2018-02-02 LAB — HEMOGLOBIN AND HEMATOCRIT, BLOOD
HEMATOCRIT: 27.8 % — AB (ref 36.0–46.0)
HEMOGLOBIN: 9.2 g/dL — AB (ref 12.0–15.0)

## 2018-02-02 LAB — SODIUM, URINE, RANDOM: SODIUM UR: 47 mmol/L

## 2018-02-02 LAB — PREPARE RBC (CROSSMATCH)

## 2018-02-02 LAB — CREATININE, URINE, RANDOM: Creatinine, Urine: 132.42 mg/dL

## 2018-02-02 MED ORDER — INSULIN ASPART 100 UNIT/ML IV SOLN
10.0000 [IU] | Freq: Once | INTRAVENOUS | Status: AC
  Administered 2018-02-02: 10 [IU] via INTRAVENOUS
  Filled 2018-02-02: qty 0.1

## 2018-02-02 MED ORDER — ALBUTEROL SULFATE (2.5 MG/3ML) 0.083% IN NEBU
10.0000 mg | INHALATION_SOLUTION | Freq: Once | RESPIRATORY_TRACT | Status: AC
  Administered 2018-02-02: 10 mg via RESPIRATORY_TRACT
  Filled 2018-02-02: qty 12

## 2018-02-02 MED ORDER — ACETAMINOPHEN 650 MG RE SUPP
325.0000 mg | Freq: Once | RECTAL | Status: DC
  Filled 2018-02-02: qty 1

## 2018-02-02 MED ORDER — PANTOPRAZOLE SODIUM 40 MG IV SOLR
40.0000 mg | Freq: Every day | INTRAVENOUS | Status: DC
  Administered 2018-02-02 – 2018-02-11 (×10): 40 mg via INTRAVENOUS
  Filled 2018-02-02 (×11): qty 40

## 2018-02-02 MED ORDER — ONDANSETRON HCL 4 MG PO TABS: 4.0000 mg | ORAL_TABLET | Freq: Four times a day (QID) | ORAL | Status: DC | PRN

## 2018-02-02 MED ORDER — METOPROLOL TARTRATE 25 MG PO TABS: 25.0000 mg | ORAL_TABLET | Freq: Two times a day (BID) | ORAL | Status: DC

## 2018-02-02 MED ORDER — PREMIER PROTEIN SHAKE
6.0000 [oz_av] | Freq: Four times a day (QID) | ORAL | Status: DC
  Administered 2018-02-02: 6 [oz_av] via ORAL
  Filled 2018-02-02 (×5): qty 325.31

## 2018-02-02 MED ORDER — HYDROMORPHONE HCL 1 MG/ML IJ SOLN
1.0000 mg | INTRAMUSCULAR | Status: DC | PRN
  Administered 2018-02-02 – 2018-02-03 (×3): 1 mg via INTRAVENOUS
  Filled 2018-02-02 (×3): qty 1

## 2018-02-02 MED ORDER — SODIUM BICARBONATE 8.4 % IV SOLN
50.0000 meq | Freq: Once | INTRAVENOUS | Status: AC
  Administered 2018-02-02: 50 meq via INTRAVENOUS
  Filled 2018-02-02: qty 50

## 2018-02-02 MED ORDER — GABAPENTIN 100 MG PO CAPS
200.0000 mg | ORAL_CAPSULE | Freq: Two times a day (BID) | ORAL | Status: DC
  Administered 2018-02-02: 200 mg via ORAL
  Filled 2018-02-02: qty 2

## 2018-02-02 MED ORDER — DEXTROSE 50 % IV SOLN
1.0000 | Freq: Once | INTRAVENOUS | Status: AC
  Administered 2018-02-02: 50 mL via INTRAVENOUS
  Filled 2018-02-02: qty 50

## 2018-02-02 MED ORDER — HYDROMORPHONE HCL 1 MG/ML IJ SOLN
0.5000 mg | INTRAMUSCULAR | Status: DC | PRN
  Administered 2018-02-02: 0.5 mg via INTRAVENOUS
  Filled 2018-02-02 (×2): qty 1

## 2018-02-02 MED ORDER — SODIUM CHLORIDE 0.9 % IV SOLN
1.0000 g | Freq: Once | INTRAVENOUS | Status: AC
  Administered 2018-02-02: 1 g via INTRAVENOUS
  Filled 2018-02-02: qty 10

## 2018-02-02 MED ORDER — CALCIUM GLUCONATE 10 % IV SOLN: 1.0000 g | Freq: Once | INTRAVENOUS | Status: DC

## 2018-02-02 MED ORDER — METOPROLOL TARTRATE 5 MG/5ML IV SOLN
2.5000 mg | Freq: Four times a day (QID) | INTRAVENOUS | Status: DC
  Administered 2018-02-02 – 2018-02-06 (×13): 2.5 mg via INTRAVENOUS
  Filled 2018-02-02 (×13): qty 5

## 2018-02-02 MED ORDER — SODIUM POLYSTYRENE SULFONATE 15 GM/60ML PO SUSP
45.0000 g | Freq: Once | ORAL | Status: AC
  Administered 2018-02-02: 45 g via RECTAL
  Filled 2018-02-02: qty 180

## 2018-02-02 MED ORDER — FUROSEMIDE 10 MG/ML IJ SOLN
20.0000 mg | Freq: Once | INTRAMUSCULAR | Status: AC
  Administered 2018-02-02: 20 mg via INTRAVENOUS
  Filled 2018-02-02: qty 2

## 2018-02-02 MED ORDER — SODIUM CHLORIDE 0.9 % IV SOLN
1.0000 g | Freq: Once | INTRAVENOUS | Status: AC
  Administered 2018-02-02: 1 g via INTRAVENOUS
  Filled 2018-02-02 (×2): qty 10

## 2018-02-02 MED ORDER — DIPHENHYDRAMINE HCL 50 MG/ML IJ SOLN
25.0000 mg | Freq: Once | INTRAMUSCULAR | Status: AC
  Administered 2018-02-02: 25 mg via INTRAVENOUS
  Filled 2018-02-02: qty 1

## 2018-02-02 MED ORDER — PIPERACILLIN-TAZOBACTAM 3.375 G IVPB
3.3750 g | Freq: Three times a day (TID) | INTRAVENOUS | Status: DC
  Administered 2018-02-02 – 2018-02-06 (×13): 3.375 g via INTRAVENOUS
  Filled 2018-02-02 (×15): qty 50

## 2018-02-02 MED ORDER — SODIUM POLYSTYRENE SULFONATE 15 GM/60ML PO SUSP
45.0000 g | Freq: Once | ORAL | Status: AC
  Administered 2018-02-02: 45 g via ORAL
  Filled 2018-02-02: qty 180

## 2018-02-02 MED ORDER — SODIUM POLYSTYRENE SULFONATE 15 GM/60ML PO SUSP
15.0000 g | ORAL | Status: AC
  Administered 2018-02-02: 15 g via ORAL
  Filled 2018-02-02: qty 60

## 2018-02-02 MED ORDER — CHLORHEXIDINE GLUCONATE 0.12 % MT SOLN
15.0000 mL | Freq: Two times a day (BID) | OROMUCOSAL | Status: DC
  Administered 2018-02-02 – 2018-02-03 (×3): 15 mL via OROMUCOSAL
  Filled 2018-02-02 (×3): qty 15

## 2018-02-02 MED ORDER — SODIUM CHLORIDE 0.9% IV SOLUTION
Freq: Once | INTRAVENOUS | Status: AC
  Administered 2018-02-02: 13:00:00 via INTRAVENOUS

## 2018-02-02 MED ORDER — ONDANSETRON HCL 4 MG/2ML IJ SOLN: 4.0000 mg | Freq: Four times a day (QID) | INTRAMUSCULAR | Status: DC | PRN

## 2018-02-02 MED ORDER — ACETAMINOPHEN 325 MG PO TABS
650.0000 mg | ORAL_TABLET | Freq: Four times a day (QID) | ORAL | Status: DC
  Administered 2018-02-02 – 2018-02-03 (×2): 650 mg via ORAL
  Filled 2018-02-02 (×2): qty 2

## 2018-02-02 MED ORDER — SODIUM CHLORIDE 0.9% IV SOLUTION
Freq: Once | INTRAVENOUS | Status: AC
  Administered 2018-02-02: 05:00:00 via INTRAVENOUS

## 2018-02-02 MED ORDER — ORAL CARE MOUTH RINSE
15.0000 mL | Freq: Two times a day (BID) | OROMUCOSAL | Status: DC
  Administered 2018-02-03: 15 mL via OROMUCOSAL

## 2018-02-02 MED ORDER — OXYCODONE HCL 5 MG PO TABS: 5.0000 mg | ORAL_TABLET | Freq: Four times a day (QID) | ORAL | Status: DC | PRN

## 2018-02-02 MED ORDER — INSULIN ASPART 100 UNIT/ML ~~LOC~~ SOLN
0.0000 [IU] | SUBCUTANEOUS | Status: DC
  Administered 2018-02-02 (×4): 2 [IU] via SUBCUTANEOUS
  Administered 2018-02-02: 1 [IU] via SUBCUTANEOUS
  Administered 2018-02-02: 2 [IU] via SUBCUTANEOUS
  Administered 2018-02-02: 1 [IU] via SUBCUTANEOUS
  Administered 2018-02-03 (×4): 2 [IU] via SUBCUTANEOUS
  Administered 2018-02-04 (×4): 1 [IU] via SUBCUTANEOUS
  Administered 2018-02-04: 2 [IU] via SUBCUTANEOUS

## 2018-02-02 MED ORDER — ALUM & MAG HYDROXIDE-SIMETH 200-200-20 MG/5ML PO SUSP
30.0000 mL | ORAL | Status: DC | PRN
  Administered 2018-02-02: 30 mL via ORAL
  Filled 2018-02-02: qty 30
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
## 2018-02-02 NOTE — Progress Notes (Addendum)
General/bariatric surgery  note  CC:  Abdominal pain  Subjective: Pt readmitted with abdominal pain, loose stools, rising creatinine, elevated WBC, increasing anemia.  She has a large amount of ecchymosis over the entire abdominal wall.  She has been on Lovenox bridge and coumadin for her aortic and mitral mechanical valves. She is in bed and hurts over her entire abdomen and cannot really move without pain.  She has had 2 units of PRBC's and no FFP so far.    Objective: Vital signs in last 24 hours: Temp:  [97.4 F (36.3 C)-98.3 F (36.8 C)] 97.4 F (36.3 C) (09/05 0714) Pulse Rate:  [92-108] 103 (09/05 0700) Resp:  [20-33] 23 (09/05 0700) BP: (88-119)/(39-63) 107/44 (09/05 0714) SpO2:  [90 %-100 %] 95 % (09/05 0700) Weight:  [153.6 kg-154.2 kg] 153.6 kg (09/05 0423) Last BM Date: 02/01/18 1588 IV 40 urine - recorded K+ 6.7/creatinine up to 2.99 Glucose 19, 243, 177 this AM 0400 WBC up 27.5/H/H 6.8/20.9 platelets OK INR 2.08 CXR 9/4;  Stable CM with some vascular congestion CT abd 9/4:  Fluid-hematocrit levels are identified within contained omental or mesenteric hematomas in the left hemiabdomen the largest measuring approximately 8.3 x 7.8 x 9.1 cm. 2. Complex non simple fluid is also seen, more free-flowing within the abdomen overlying the liver and spleen and extending into the pelvis compatible with stigmata of hemoperitoneum. No active focus of hemorrhage is identified on this unenhanced study. 3. Soft tissue induration of the abdominopelvic wall likely representing edema or concordant with the patient's history of bruising. 4. No retroperitoneal hemorrhage. 5. Intact gastric sleeve staple line without dehiscence identified. No adjacent free air is noted. No abscess collections are seen. No bowel obstruction is noted. Intake/Output from previous day: 09/04 0701 - 09/05 0700 In: 1588 [I.V.:9; Blood:294; IV Piggyback:1260] Out: 40 [Urine:40] Intake/Output this  shift: Total I/O In: 295.4 [Blood:295.4] Out: -   General appearance: alert, cooperative, mild distress and abdominal pain Resp: clear to auscultation bilaterally and anterior Cardio: irregular rate/around 100 GI: marked ecchymosis over entire abdomen, few BS port sites look fine Extremities: Lower leg lymphedema  Lab Results:  Recent Labs    02/01/18 2056 02/01/18 2102 02/02/18 0357  WBC 24.2*  --  27.5*  HGB 6.7* 6.5* 6.8*  HCT 21.2* 19.0* 20.9*  PLT 466*  --  392    BMET Recent Labs    02/01/18 2056 02/01/18 2102 02/02/18 0357  NA 141 137 141  K 6.6* 6.3* 6.7*  CL 108 107 108  CO2 20*  --  24  GLUCOSE 258* 243* 177*  BUN 47* 44* 55*  CREATININE 2.53* 2.60* 2.99*  CALCIUM 9.4  --  9.0   PT/INR Recent Labs    02/01/18 2121 02/02/18 0357  LABPROT 22.1* 23.3*  INR 1.95 2.08    Recent Labs  Lab 01/27/18 0333 02/01/18 2056  AST  --  36  ALT  --  26  ALKPHOS  --  59  BILITOT  --  1.2  PROT  --  6.6  ALBUMIN 3.6 3.2*     Lipase     Component Value Date/Time   LIPASE 57 (H) 02/01/2018 2056   Prior to Admission medications   Medication Sig Start Date End Date Taking? Authorizing Provider  acetaminophen (TYLENOL) 650 MG CR tablet Take 1,300 mg by mouth 3 (three) times daily.    Yes [provider]  allopurinol (ZYLOPRIM) 300 MG tablet Take 300 mg by mouth at bedtime.  Yes [provider]  buPROPion (WELLBUTRIN XL) 300 MG 24 hr tablet Take 300 mg by mouth daily.  08/01/12  Yes [provider]  cetirizine (ZYRTEC) 10 MG tablet Take 1 tablet (10 mg total) by mouth daily. Patient taking differently: Take 10 mg by mouth at bedtime.  06/20/15  Yes Magdalen Spatz, NP  Cholecalciferol (VITAMIN D3) 2000 UNITS TABS Take 4,000 Units by mouth daily.    Yes [provider]  citalopram (CELEXA) 20 MG tablet Take 20 mg by mouth daily.  08/01/12  Yes [provider]  enoxaparin (LOVENOX) 150 MG/ML injection Inject 1 mL (150 mg  total) into the skin every 12 (twelve) hours. 01/16/18  Yes Lorretta Harp, MD  metoprolol tartrate (LOPRESSOR) 50 MG tablet Take 0.5 tablets (25 mg total) by mouth 2 (two) times daily. 01/31/18  Yes Greer Pickerel, MD  montelukast (SINGULAIR) 10 MG tablet Take 10 mg by mouth daily.    Yes [provider]  Multiple Vitamins-Minerals (CENTRUM SILVER 50+WOMEN) TABS Take 1 tablet by mouth daily.    Yes [provider]  ondansetron (ZOFRAN-ODT) 4 MG disintegrating tablet Take 1 tablet (4 mg total) by mouth every 6 (six) hours as needed for nausea or vomiting. 01/31/18  Yes Greer Pickerel, MD  OXYGEN Inhale 2.5-3 L into the lungs continuous.    Yes [provider]  pantoprazole (PROTONIX) 40 MG tablet Take 1 tablet (40 mg total) by mouth daily. 01/31/18  Yes Greer Pickerel, MD  pravastatin (PRAVACHOL) 80 MG tablet Take 80 mg by mouth daily.  08/01/12  Yes [provider]  SYMBICORT 80-4.5 MCG/ACT inhaler TAKE 2 PUFFS BY MOUTH TWICE A DAY Patient taking differently: Inhale 2 puffs into the lungs 2 (two) times daily.  10/05/17  Yes Juanito Doom, MD  traMADol (ULTRAM) 50 MG tablet Take 1 tablet (50 mg total) by mouth every 6 (six) hours as needed (pain). Patient taking differently: Take 50-100 mg by mouth every 6 (six) hours as needed (pain).  01/31/18  Yes Greer Pickerel, MD  triamcinolone (NASACORT AQ) 55 MCG/ACT AERO nasal inhaler Place 2 sprays into the nose daily. Patient taking differently: Place 2 sprays into the nose daily as needed (for seasonal allergies).  06/20/15  Yes Magdalen Spatz, NP  warfarin (COUMADIN) 7.5 MG tablet TAKE 1 TABLET BY MOUTH DAILY OR AS DIRECTED BY COUMADIN CLINIC Patient taking differently: Take 5-7.5 mg by mouth See admin instructions. Take 7.5 mg by mouth once daily at night except on Wed and Fri take 3.75 mg every night 12/07/17  Yes Lorretta Harp, MD  albuterol (PROVENTIL HFA;VENTOLIN HFA) 108 (90 Base) MCG/ACT inhaler Inhale 2 puffs into the  lungs 2 (two) times daily as needed for wheezing or shortness of breath. Reported on 06/20/2015 Patient not taking: Reported on 02/01/2018 05/04/17   Juanito Doom, MD  albuterol (PROVENTIL) (2.5 MG/3ML) 0.083% nebulizer solution Take 3 mLs (2.5 mg total) by nebulization every 4 (four) hours as needed for wheezing or shortness of breath. Patient not taking: Reported on 02/01/2018 06/20/17   Juanito Doom, MD      Medications: . chlorhexidine  15 mL Mouth Rinse BID  . insulin aspart  0-9 Units Subcutaneous Q4H  . mouth rinse  15 mL Mouth Rinse q12n4p  . mometasone-formoterol  2 puff Inhalation BID   . methocarbamol (ROBAXIN) IV    . ondansetron (ZOFRAN) IV    . piperacillin-tazobactam (ZOSYN)  IV     Anti-infectives (  From admission, onward)   Start     Dose/Rate Route Frequency Ordered Stop   02/02/18 0800  piperacillin-tazobactam (ZOSYN) IVPB 3.375 g     3.375 g 12.5 mL/hr over 240 Minutes Intravenous Every 8 hours 02/02/18 0300     02/01/18 2245  piperacillin-tazobactam (ZOSYN) IVPB 3.375 g     3.375 g 100 mL/hr over 30 Minutes Intravenous NOW 02/01/18 2234 02/02/18 0203      Assessment/Plan Severe obesity BMI 55  LAPAROSCOPIC SLEEVE GASTRECTOMY WITH HIATAL HERNIA REPAIR, UPPER GI ENDOSCOPY, 01/24/18, Dr. Greer Pickerel (Hospitalized 8/27- 01/31/18)  Hx ILD (interstitial lung disease) - Home O2 History of Aortic and  mitral valve replacement with St Jude's mechanical valve, 07/2014   - on chronic anticoagulation - coumadin with Lovenox bridge  Hx of chronic diastolic CHF/post op A flutter CKD stage 3,  GFR 30-59 ml/min    creatinine 1.19 (01/31/18) >> 2.99 this AM (02/02/18) Essential hypertension Hyperlipidemia Obstructive sleep apnea Chronic acquired lymphedema - Right lower extremity   Abdominal pain/anemia post - readmit 02/01/18 Worsening CKD 1.19 >>2.99 Hyperkalemia 5.0 >> 6.7 Leukocytosis  8.7 >> 27.9 Anemia 9.0 >> 6.5 INR 2.08  FEN: ? IV fluids/NPO ID:  Zosyn 02/01/18  >> day 2 DVT:  INR 2.08 Follow up:  Renal/Cardiology/Dr. [name]  Plan:  Dr. Lucia Gaskins and [name] are aware of admit and will see her ASAP.    She will need Renal consult to help with renal failure.   I will give her ice chips for now, and Medicine is treating K+ with kayexalate, they plan diuresis this AM also.  More labs at 9 per nursing.      LOS: 0 days    JENNINGS,WILLARD 02/02/2018 [phone]  Agree with above. Cardiology following and renal consulted.  On isolation for diarrhea - though no BM since she's been here. Main complaint is abdominal pain - not nauseated. Significant bruising of entire abdominal wall.  Alphonsa Overall, MD, [facility] Pager: [phone] Office phone:  [phone]

## 2018-02-02 NOTE — Progress Notes (Signed)
PROGRESS NOTE    [name]  VZD:[id number] DOB: [birth year] DOA: 02/01/2018 PCP: [name], FNP    Brief Narrative:   [name] is a 57 y.o. 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 Fib/flutter, asthma, ILD.  Patient was recently admitted and discharged from surgical service- 8/27-01/31/18, for a planned laparoscopic sleeve gastrectomy.  Patient was bridged with Lovenox.  Hospital stay was complicated by hypotension and acute anemia, requiring transfusion of 2 units PRBCs and multiple IV fluid boluses, acute kidney injury with reduced urine output requiring nephrology consultation.  When patient's hemoglobin was stable patient was started on heparin GTT without bolus, at the time of discharge, he was sent home with Lovenox and Coumadin as her INR was still subtherapeutic.  She reports since discharge abdomen has increased in size with increasing abdominal pain.  Patient denies vomiting but reports 4-5 episodes of watery stools over the past 24 hours.  Denies fever or chills, no chest pain shortness of breath or cough, denies dysuria, no headache or neck stiffness.   ED Course: Blood pressure systolic greater than 295, initial tachypnea 33 down to 20, O2 sats greater than 93% on nasal cannula.  Creatinine elevated 2.5, compared to 1.1 on discharge, potassium elevated 6.6.  WBC elevated 24.  Hgb low 6.7 from discharge hemoglobin 9.  Stable chest x-ray -cardiomegaly vascular congestion. CT abdomen and pelvis Wo contrast-fluid hematocrit levels identified  Within contained omental or mesenteric hematomas in the left hemiabdomen the largest measuring approximately 8.3 x 7.8 x 9.1, complex non-simple fluid also seen more free-flowing within the abdomen overlying the liver and spleen and extending into the pelvis compatible with stigmata of  hemoperitoneum, no active focus of hemorrhage identified.  1 L bolus normal saline given in ED. PAtient seen by general surgeon on call - Dr. Johney Maine was consulted recommended hospitalist admission with concomitant medical problems- acute kidney injury, hyperkalemia,  Start Zosyn for possible infection considering  significant leukocytosis   Assessment & Plan:   Principal Problem:   Postoperative anemia due to chronic blood loss on full anticoagulation Active Problems:   Hyperkalemia   Acute renal failure superimposed on stage 3 chronic kidney disease (HCC)   Mesenteric hematoma   Hemoperitoneum   Abnormal CT scan, chest   Chronic asthmatic bronchitis (HCC)   ILD (interstitial lung disease) (HCC)   History of mitral valve replacement with mechanical valve   CKD (chronic kidney disease) stage 3, GFR 30-59 ml/min (HCC)   Chronic respiratory failure (HCC)   Obstructive sleep apnea   Morbid obesity with BMI of 50.0-59.9, adult ([place])   Chronic acquired lymphedema - Right lower extremity   Hx of mechanical aortic valve replacement   Gout   HX: long term anticoagulant use   Warfarin anticoagulation   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  INR is 2.08 this morning.  Patient with history of aortic and mechanical valve.  Patient with significant abdominal pain.  Hemoglobin  noted to be as low as 6.5 on admission.  Patient status post 2 units packed red blood cells.  Hemoglobin after 1 unit of packed red  blood cell was 6.8.  Repeat CBC posttransfusion pending.  Transfusion threshold hemoglobin less than 8.  Hold anticoagulation for now.  Follow H&H.  2.  Hyperkalemia Likely secondary to acute on chronic kidney disease in the setting of hematomas/hemoperitoneum.  Patient noted to have peaked T waves on EKG on admission with improvement on repeat EKG this morning.  Potassium was as high as 6.6 and was 6.7 this morning.  Will order repeat labs this morning and if potassium greater than 6 we will give a Kayexalate enema due to concerns for absorption issues after recent gastrectomy.  Patient was given IV calcium gluconate and NovoLog.  Discussed with nephrology.  Follow.  3.  Acute on chronic kidney disease stage III Baseline creatinine 1.3.  Creatinine up as high as 2.99 this morning.  Differential includes prerenal azotemia in the setting of anemia and borderline blood pressure on admission versus ATN.  Urine output of 40 cc since admission.  Foley catheter in place.  Urinalysis done on admission was protein negative with a specific gravity of 1.024.  Urine sodium ordered this morning at 47.  Creatinine 132.42.  Chest x-ray concerning for pulmonary edema however patient with a history of interstitial lung disease.  Lasix 20 mg IV x1 given post transfusion of 2 units packed red blood cells.  Consult with nephrology for further evaluation and management.  Follow.  4.  Leukocytosis Questionable etiology.  May be reactive secondary to hemoperitoneum and mesenteric hematomas.  Urinalysis with trace leukocytes, negative nitrite, 0-5 WBCs, rare bacteria.  Check a urine culture.  Check blood cultures x2.  Patient on empiric IV Zosyn.  Follow.  5.  Diarrhea Patient on admission did state that she had episodes of diarrhea.  Patient with no further diarrhea during this hospitalization.  C. difficile PCR pending.  If no diarrhea in the next 24 hours we will discontinue C. difficile PCR and remove enteric precautions.   Follow for now.  6.  Status post aortic valve replacement and mitral valve replacement Patient status post history of aortic valve replacement mitral valve replacement in 2016 on chronic anticoagulation.  During recent hospitalization patient was bridged with Lovenox during surgery and sent home with a Lovenox bridge with Coumadin as INR was not therapeutic.  INR today 2.08.  Last dose of Lovenox was the morning of 02/01/2018.  Will hold anticoagulation for now secondary to anemia, hemoperitoneum, mesenteric hematomas.  Due to patient's complex cardiac history we will consult with cardiology for further evaluation and recommendations.  Will monitor INR for now and defer to cardiology as to whether to reverse anticoagulation with vitamin K or FFP.  Cardiology consultation pending.  7.  Obstructive sleep apnea CPAP nightly.  8.  Chronic right lower extremity lymphedema Stable.  9.  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  10.  Hypoxia Patient with history of interstitial lung disease.  Currently on 4 L nasal cannula with sats of 91%.  Patient with no use of accessory muscles of respiration.  Chest x-ray on admission concerning for vascular congestion.  Patient status post 2 units packed red blood cells.  Lasix 20 mg IV x1.  Repeat chest x-ray this morning.  If worsening respiratory status will consult with critical care medicine for further evaluation and management.  11.  Morbid obesity Status post sleeve gastrectomy  01/24/2018.    DVT prophylaxis: SCDs Code Status: Full Family Communication: Updated patient.  No family at bedside. Disposition Plan: Remain in stepdown unit.   Consultants:   Cardiology pending  Nephrology pending  General surgery: Dr. Johney Maine 02/01/2018  Procedures:   CT abdomen and pelvis 02/01/2018  Chest x-ray 02/01/2018  2 units packed red blood cells 02/01/2018, 02/02/2018  Antimicrobials:   IV Zosyn  02/01/2018   Subjective: Sleeping.  Easily arousable.  Alert and oriented x3.  Complaining of diffuse abdominal pain.  Asking for something to eat or drink as she is feels very dry.  No further loose bowel movements.  No nausea or emesis.  Denies any chest pain.  Denies shortness of breath.  Objective: Vitals:   02/02/18 0700 02/02/18 0714 02/02/18 0800 02/02/18 0900  BP: (!) 119/46 (!) 107/44 (!) 126/57   Pulse: (!) 103  (!) 102 (!) 102  Resp: (!) 23  (!) 21 (!) 23  Temp:  (!) 97.4 F (36.3 C)    TempSrc:  Oral    SpO2: 95%  94% 94%  Weight:      Height:        Intake/Output Summary (Last 24 hours) at 02/02/2018 1002 Last data filed at 02/02/2018 0842 Gross per 24 hour  Intake 1908.53 ml  Output 40 ml  Net 1868.53 ml   Filed Weights   02/01/18 2028 02/02/18 0423  Weight: (!) 154.2 kg (!) 153.6 kg    Examination:  General exam: Sleeping. Pallor. Respiratory system: Coarse diffuse crackles/breath sounds.  No wheezing.  Normal respiratory effort.  Cardiovascular system: Tachycardia.  Chronic right lower extremity lymphedema.  Thick neck.  1-2+ left lower extremity edema.  Gastrointestinal system: Abdomen is diffusely tender to palpation, diffuse ecchymosis, positive bowel sounds, no rebound, some guarding.  Wound site c/d/i with Steri-Strips noted. Central nervous system: Alert and oriented. No focal neurological deficits. Extremities: Symmetric 5 x 5 power. Skin: No rashes, lesions or ulcers Psychiatry: Judgement and insight appear normal. Mood & affect appropriate.     Data Reviewed: I have personally reviewed following labs and imaging studies  CBC: Recent Labs  Lab 01/29/18 0455 01/30/18 0502 01/31/18 0502 02/01/18 2056 02/01/18 2102 02/02/18 0357  WBC 9.3 9.7 8.7 24.2*  --  27.5*  NEUTROABS  --   --   --  21.1  --   --   HGB 8.0* 8.6* 9.0* 6.7* 6.5* 6.8*  HCT 24.8* 27.8* 29.0* 21.2* 19.0* 20.9*  MCV 99.6 101.5* 102.1* 100.0  --  95.4  PLT 218 269 278 466*  --   858   Basic Metabolic Panel: Recent Labs  Lab 01/27/18 0333 01/28/18 0217 01/29/18 0853 01/30/18 0502 01/31/18 0502 02/01/18 2056 02/01/18 2102 02/02/18 0357  NA 135 137 142 142 143 141 137 141  K 5.1 4.9 5.2* 5.1 5.0 6.6* 6.3* 6.7*  CL 95* 98 107 106 107 108 107 108  CO2 26 28 26 27 28  20*  --  24  GLUCOSE 118* 111* 93 96 95 258* 243* 177*  BUN 73* 80* 69* 57* 41* 47* 44* 55*  CREATININE 4.22* 3.33* 1.77* 1.46* 1.19* 2.53* 2.60* 2.99*  CALCIUM 8.9 9.3 9.6 9.6 9.7 9.4  --  9.0  MG  --  2.4  --   --   --   --   --   --   PHOS 6.2* 5.4*  --   --   --   --   --   --  GFR: Estimated Creatinine Clearance: 31 mL/min (A) (by C-G formula based on SCr of 2.99 mg/dL (H)). Liver Function Tests: Recent Labs  Lab 01/27/18 0333 02/01/18 2056  AST  --  36  ALT  --  26  ALKPHOS  --  59  BILITOT  --  1.2  PROT  --  6.6  ALBUMIN 3.6 3.2*   Recent Labs  Lab 02/01/18 2056  LIPASE 57*   No results for input(s): AMMONIA in the last 168 hours. Coagulation Profile: Recent Labs  Lab 01/29/18 0455 01/30/18 0502 01/31/18 0502 02/01/18 2121 02/02/18 0357  INR 1.29 1.26 1.42 1.95 2.08   Cardiac Enzymes: No results for input(s): CKTOTAL, CKMB, CKMBINDEX, TROPONINI in the last 168 hours. BNP (last 3 results) No results for input(s): PROBNP in the last 8760 hours. HbA1C: No results for input(s): HGBA1C in the last 72 hours. CBG: Recent Labs  Lab 01/26/18 1542 02/02/18 0127 02/02/18 0418 02/02/18 0736  GLUCAP 122* 170* 163* 149*   Lipid Profile: No results for input(s): CHOL, HDL, LDLCALC, TRIG, CHOLHDL, LDLDIRECT in the last 72 hours. Thyroid Function Tests: No results for input(s): TSH, T4TOTAL, FREET4, T3FREE, THYROIDAB in the last 72 hours. Anemia Panel: No results for input(s): VITAMINB12, FOLATE, FERRITIN, TIBC, IRON, RETICCTPCT in the last 72 hours. Sepsis Labs: No results for input(s): PROCALCITON, LATICACIDVEN in the last 168 hours.  Recent Results (from the  past 240 hour(s))  MRSA PCR Screening     Status: None   Collection Time: 01/26/18  1:51 PM  Result Value Ref Range Status   MRSA by PCR NEGATIVE NEGATIVE Final    Comment:        The GeneXpert MRSA Assay (FDA approved for NASAL specimens only), is one component of a comprehensive MRSA colonization surveillance program. It is not intended to diagnose MRSA infection nor to guide or monitor treatment for MRSA infections. Performed at [facility], [place] [address], [place], [place] [address]          Radiology Studies: Ct Abdomen Pelvis Wo Contrast  Result Date: 02/01/2018 CLINICAL DATA:  57 year old female status post gastric sleeve procedure last Tuesday presents with abdominal pain, distention and diarrhea. Bruising along the abdominal. Patient is pale and there is concern for retroperitoneal hemorrhage. EXAM: CT ABDOMEN AND PELVIS WITHOUT CONTRAST TECHNIQUE: Multidetector CT imaging of the abdomen and pelvis was performed following the standard protocol without IV contrast. COMPARISON:  None. FINDINGS: Assessment of solid organ injury/laceration is limited due to lack of IV contrast. Lower chest: Heart size is enlarged without pericardial effusion. Evidence of prior median sternotomy and mitral valvular repair of the included heart. Subsegmental atelectasis is seen at the lung bases and/or scarring. No effusion or pneumothorax. Hepatobiliary: The unenhanced liver is unremarkable. No biliary dilatation. Nondistended gallbladder free of stones. Hyperdense appearance of the bile within the gallbladder may be secondary to hemolysis. No subcapsular fluid about the liver. Pancreas: Normal Spleen: Normal.  No subcapsular fluid. Adrenals/Urinary Tract: Normal bilateral adrenal glands. Small rounded cortical hyperdensities may represent tiny proteinaceous or hemorrhagic cysts. No nephrolithiasis nor obstructive uropathy. The urinary bladder is unremarkable. Stomach/Bowel: Staple  line across the stomach from gastric sleeve procedure. No adjacent pneumoperitoneum or evidence of staple line dehiscence. Normal small bowel rotation. No bowel obstruction. The appendix is not confidently identified. Vascular/Lymphatic: Mild aortic atherosclerosis without aneurysm. No lymphadenopathy. Reproductive: Retroverted appearing uterus.  No adnexal mass. Other: Localized hematomas are identified in the left upper quadrant of the abdomen with hematocrit  levels ranging in 2.7 cm and 2.9 cm in the subdiaphragmatic portion of the left upper quadrant with two larger hematomas measuring 8.3 x 7.5 x 8.3 cm on series 2/24 and similar-sized hematoma further caudad measuring 8.3 x 7.8 x 9.1 cm with demonstrable hematocrit level, series 2/45. Moderate to large volume of hemoperitoneum without active hemorrhage is identified outlining the liver and spleen and extending into the pelvis along the paracolic gutters. Diffuse soft tissue induration and edema is noted of the abdominal and pelvic wall compatible with history of bruising. No retroperitoneal hemorrhage is identified. No free air is identified. No apparent abscess. Musculoskeletal: Thoracolumbar spondylosis extending to the L2 level with marked disc space narrowing and degenerative disc disease. Disc bulge L4-5. No acute nor aggressive osseous lesions. IMPRESSION: 1. Fluid-hematocrit levels are identified within contained omental or mesenteric hematomas in the left hemiabdomen the largest measuring approximately 8.3 x 7.8 x 9.1 cm. 2. Complex non simple fluid is also seen, more free-flowing within the abdomen overlying the liver and spleen and extending into the pelvis compatible with stigmata of hemoperitoneum. No active focus of hemorrhage is identified on this unenhanced study. 3. Soft tissue induration of the abdominopelvic wall likely representing edema or concordant with the patient's history of bruising. 4. No retroperitoneal hemorrhage. 5. Intact gastric  sleeve staple line without dehiscence identified. No adjacent free air is noted. No abscess collections are seen. No bowel obstruction is noted. These results were called by telephone at the time of interpretation on 02/01/2018 at 10:15 pm to [place] , who verbally acknowledged these results. Electronically Signed   By: Ashley Royalty M.D.   On: 02/01/2018 22:15   Dg Chest Portable 1 View  Result Date: 02/01/2018 CLINICAL DATA:  Abdominal pain EXAM: PORTABLE CHEST 1 VIEW COMPARISON:  01/25/2018 FINDINGS: The heart is mildly enlarged. Stable vascular congestion. Lungs are under aerated with bibasilar atelectasis. Postoperative changes. No pneumothorax. IMPRESSION: Stable cardiomegaly and vascular congestion. Electronically Signed   By: Marybelle Killings M.D.   On: 02/01/2018 21:46        Scheduled Meds: . chlorhexidine  15 mL Mouth Rinse BID  . insulin aspart  0-9 Units Subcutaneous Q4H  . mouth rinse  15 mL Mouth Rinse q12n4p  . metoprolol tartrate  2.5 mg Intravenous Q6H  . mometasone-formoterol  2 puff Inhalation BID   Continuous Infusions: . methocarbamol (ROBAXIN) IV    . ondansetron (ZOFRAN) IV    . piperacillin-tazobactam (ZOSYN)  IV 12.5 mL/hr at 02/02/18 0842     LOS: 0 days    Time spent: 50 mins    Irine Seal, MD [facility] Pager [phone]  If 7PM-7AM, please contact night-coverage [email or web address] Password [email or web address] 02/02/2018, 10:02 AM

## 2018-02-02 NOTE — Progress Notes (Signed)
Pharmacy Antibiotic Note  [name] is a 57 y.o. female admitted on 02/01/2018 with intra-abdominal infection.  Pharmacy has been consulted for zosyn dosing.  Plan: Zosyn 3.375g IV q8h (4 hour infusion). F/u scr/cultures  Height: 5\' 5"  (165.1 cm) Weight: (!) 340 lb (154.2 kg) IBW/kg (Calculated) : 57  Temp (24hrs), Avg:98 F (36.7 C), Min:97.7 F (36.5 C), Max:98.2 F (36.8 C)  Recent Labs  Lab 01/28/18 0217 01/29/18 0455 01/29/18 0853 01/30/18 0502 01/31/18 0502 02/01/18 2056 02/01/18 2102  WBC 11.5* 9.3  --  9.7 8.7 24.2*  --   CREATININE 3.33*  --  1.77* 1.46* 1.19* 2.53* 2.60*    Estimated Creatinine Clearance: 35.7 mL/min (A) (by C-G formula based on SCr of 2.6 mg/dL (H)).    No Known Allergies  Antimicrobials this admission: 9/5 zosyn >>    >>   Dose adjustments this admission:   Microbiology results:  BCx:   UCx:    Sputum:    MRSA PCR:   Thank you for allowing pharmacy to be a part of this patient's care.  Dorrene German 02/02/2018 2:57 AM

## 2018-02-02 NOTE — Progress Notes (Signed)
CRITICAL VALUE ALERT  Critical Value:  Potassium 6.7  Date & Time Notied:  8144; 02/02/2018  Provider Notified: Grandville Silos MD  Orders Received/Actions taken: Calcium gluconate 1g and Kayexalate
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
## 2018-02-02 NOTE — Progress Notes (Signed)
Abdominal pressure reading completed. At 10.

## 2018-02-02 NOTE — Progress Notes (Signed)
CRITICAL VALUE ALERT  Critical Value:  Potassium 6.7; hgb 6.8  Date & Time Notied:  02/02/18 0435  Provider Notified: Baltazar Najjar, NP via text page  Orders Received/Actions taken: Blood transfusion ordered and started. 10 units IV insulin, 1 g calcium gluconate, and 50 ml of D50 ordered and administered.

## 2018-02-02 NOTE — Progress Notes (Signed)
Alerted to pt's readmission Im not available to this pm to see pt.  Our ACS service at [facility] will see pt this am.   Chart/imaging reviewed  Agree with transfusion to get hgb>8 May need diuresis with blood Needs RENAL consult to help guide these decisions since her volume status is tricky Would recheck renal panel later today to follow bmet/K Daily weights Strict I/Os.  Hold anticoagulation scds Defer diet status until can be physically examined by member of surgical team (if cleared for PO then she would need BARIATRIC full liquid diet and premier protein shakes) No evidence of leak on ct. No abscess.  Don't think she needs IV abx. I think wbc is reactive.  Cards consult.  Mansfield [name], MD, FACS General, Bariatric, & Minimally Invasive Surgery [facility], [place]

## 2018-02-02 NOTE — Progress Notes (Signed)
Patient ID: [name], female   DOB: 10/30/1960, 57 y.o.   MRN: [id number]   Progress Note: Metabolic and Bariatric Surgery Service   Chief Complaint/Subjective: Daughter at bedside. States her mom looks better.  Pt resting Finishing 4u prbc since ED No hypoTN Good uop No n/v. Hungry Abd is tight  Objective: Vital signs in last 24 hours: Temp:  [97.4 F (36.3 C)-99.8 F (37.7 C)] 99.1 F (37.3 C) (09/05 1836) Pulse Rate:  [92-112] 107 (09/05 1836) Resp:  [15-33] 18 (09/05 1836) BP: (88-126)/(35-97) 96/35 (09/05 1836) SpO2:  [89 %-100 %] 90 % (09/05 1836) Weight:  [153.6 kg-154.2 kg] 153.6 kg (09/05 0423) Last BM Date: 02/01/18  Intake/Output from previous day: 09/04 0701 - 09/05 0700 In: 1588 [I.V.:9; Blood:294; IV Piggyback:1260] Out: 40 [Urine:40] Intake/Output this shift: Total I/O In: 1075.6 [Blood:973.4; IV Piggyback:102.1] Out: 700 [Urine:700]  Lungs: cta  Cardiovascular: click, mild tachy  Abd: obese, full, extensive bruising - a little more than earlier in week  Extremities: chronic RLE lymphedema; LLE - none  Neuro: nonfocal, approp  Lab Results: CBC  Recent Labs    02/02/18 0357 02/02/18 0925  WBC 27.5* 27.8*  HGB 6.8* 7.7*  HCT 20.9* 23.2*  PLT 392 398   BMET Recent Labs    02/02/18 0357 02/02/18 0925  NA 141 142  K 6.7* 6.7*  CL 108 106  CO2 24 23  GLUCOSE 177* 164*  BUN 55* 58*  CREATININE 2.99* 3.02*  CALCIUM 9.0 9.3   PT/INR Recent Labs    02/01/18 2121 02/02/18 0357  LABPROT 22.1* 23.3*  INR 1.95 2.08   ABG No results for input(s): PHART, HCO3 in the last 72 hours.  Invalid input(s): PCO2, PO2  Studies/Results:  Anti-infectives: Anti-infectives (From admission, onward)   Start     Dose/Rate Route Frequency Ordered Stop   02/02/18 0800  piperacillin-tazobactam (ZOSYN) IVPB 3.375 g     3.375 g 12.5 mL/hr over 240 Minutes Intravenous Every 8 hours 02/02/18 0300     02/01/18 2245  piperacillin-tazobactam  (ZOSYN) IVPB 3.375 g     3.375 g 100 mL/hr over 30 Minutes Intravenous NOW 02/01/18 2234 02/02/18 0203      Medications: Scheduled Meds: . acetaminophen  325 mg Rectal Once  . acetaminophen  650 mg Oral Q6H  . chlorhexidine  15 mL Mouth Rinse BID  . gabapentin  200 mg Oral BID  . insulin aspart  0-9 Units Subcutaneous Q4H  . mouth rinse  15 mL Mouth Rinse q12n4p  . metoprolol tartrate  2.5 mg Intravenous Q6H  . mometasone-formoterol  2 puff Inhalation BID  . pantoprazole (PROTONIX) IV  40 mg Intravenous Daily  . protein supplement shake  6 oz Oral QID   Continuous Infusions: . methocarbamol (ROBAXIN) IV    . ondansetron (ZOFRAN) IV    . piperacillin-tazobactam (ZOSYN)  IV 12.5 mL/hr at 02/02/18 1800   PRN Meds:.alum & mag hydroxide-simeth, hydrocortisone, hydrocortisone cream, HYDROmorphone (DILAUDID) injection, magic mouthwash, methocarbamol (ROBAXIN) IV, metoprolol tartrate, ondansetron (ZOFRAN) IV **OR** ondansetron (ZOFRAN) IV, ondansetron **OR** [DISCONTINUED] ondansetron (ZOFRAN) IV, phenol  Assessment/Plan: Patient Active Problem List   Diagnosis Date Noted  . Abdominal pain 02/02/2018  . Hyperkalemia 02/02/2018  . Acute kidney injury superimposed on CKD ([place]) 02/02/2018  . Mesenteric hematoma 02/02/2018  . Hemoperitoneum 02/02/2018  . Anemia   . Intra-abdominal hematoma   . 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 02/01/2018  . Postoperative anemia due to chronic blood loss on full anticoagulation  02/01/2018  . AKI (acute kidney injury) ([place]) 02/01/2018  . Acute renal failure superimposed on stage 3 chronic kidney disease ([place]) 01/26/2018  . PAF (paroxysmal atrial fibrillation) ([place])   . Acute respiratory failure ([place])   . Hypoxia   . Postprocedural hypotension   . Chronic acquired lymphedema - Right lower extremity 01/24/2018  . Hx of mechanical aortic valve replacement 01/24/2018  . Obstructive sleep apnea 02/03/2017  . Chronic diastolic heart failure ([place])  01/11/2017  . Cellulitis 11/30/2016  . Morbid obesity with BMI of 50.0-59.9, adult ([place]) 02/11/2016  . HX: long term anticoagulant use 02/11/2016  . Chronic respiratory failure ([place]) 08/12/2015  . 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 Dyspnea 07/22/2015  . Allergic rhinitis 05/02/2015  . Exertional dyspnea   . Symptomatic anemia 04/08/2015  . History of mitral valve replacement with mechanical valve 04/08/2015  . CAP (community acquired pneumonia) 04/08/2015  . CKD (chronic kidney disease) stage 3, GFR 30-59 ml/min (HCC) 04/08/2015  . Atrial flutter, unspecified   . Chronic anticoagulation 09/04/2014  . Warfarin anticoagulation 08/14/2014  . Presence of prosthetic heart valve 08/07/2014  . ILD (interstitial lung disease) ([place]) 10/12/2013  . Aortic stenosis 10/02/2013  . Hyperlipidemia 10/02/2013  . Chronic asthmatic bronchitis ([place]) 08/06/2013  . Abnormal CT scan, chest 08/09/2012  . Gout 04/16/2011  . Essential hypertension 10/07/2010  . Depressive disorder, not elsewhere classified 10/07/2010   Appreciate Triad, cards, renal assist.  Long conversation with daughter.   ABL anemia - F/u post transfusion labs. Repeat labs in am. If continues to require prbc to get hgb >8, may need to reverse anticoagulation - would like to avoid vit K (IMO) if able. Maintain pIV. 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 No free air. No fluid collection under diaphragm. Will allow bari full liquids and protein shakes AKI on CKD - good uop. Follow Cr.  Hyperkalemia- per renal/triad Mechanical valves  - hold anticoagulation.  Pain control - add scheduled tylenol/gabapentin. Prn dilaudid/oxy. I believe her discomfort is mainly coming from abd wall.  ILD - on chronic home O2 ID -  Suspicion for Cdiff very low - no more loose stools. Believe wbc is reactive. Low threshold for stopping abx Friday.   OOB Friday, PT consult.    Disposition: ICU  LOS: 0 days   Greer Pickerel, MD [phone] [facility]

## 2018-02-03 ENCOUNTER — Encounter (HOSPITAL_COMMUNITY)
Admission: EM | Disposition: A | Discharge status: Home Health | Payer: Self-pay | Source: Home / Self Care | Attending: General Surgery

## 2018-02-03 ENCOUNTER — Inpatient Hospital Stay (HOSPITAL_COMMUNITY): Payer: Medicare Other

## 2018-02-03 ENCOUNTER — Inpatient Hospital Stay (HOSPITAL_COMMUNITY): Payer: Medicare Other | Admitting: Anesthesiology

## 2018-02-03 DIAGNOSIS — Z903 Acquired absence of stomach [part of]: Secondary | ICD-10-CM

## 2018-02-03 DIAGNOSIS — S3692XD Contusion of unspecified intra-abdominal organ, subsequent encounter: Secondary | ICD-10-CM

## 2018-02-03 DIAGNOSIS — R011 Cardiac murmur, unspecified: Secondary | ICD-10-CM

## 2018-02-03 DIAGNOSIS — J9622 Acute and chronic respiratory failure with hypercapnia: Secondary | ICD-10-CM

## 2018-02-03 DIAGNOSIS — E875 Hyperkalemia: Secondary | ICD-10-CM

## 2018-02-03 DIAGNOSIS — R9389 Abnormal findings on diagnostic imaging of other specified body structures: Secondary | ICD-10-CM

## 2018-02-03 DIAGNOSIS — Z6841 Body Mass Index (BMI) 40.0 and over, adult: Secondary | ICD-10-CM

## 2018-02-03 DIAGNOSIS — J9621 Acute and chronic respiratory failure with hypoxia: Secondary | ICD-10-CM | POA: Diagnosis not present

## 2018-02-03 DIAGNOSIS — R509 Fever, unspecified: Secondary | ICD-10-CM | POA: Diagnosis not present

## 2018-02-03 DIAGNOSIS — Z841 Family history of disorders of kidney and ureter: Secondary | ICD-10-CM

## 2018-02-03 DIAGNOSIS — G9341 Metabolic encephalopathy: Secondary | ICD-10-CM | POA: Diagnosis not present

## 2018-02-03 DIAGNOSIS — K661 Hemoperitoneum: Secondary | ICD-10-CM

## 2018-02-03 DIAGNOSIS — I890 Lymphedema, not elsewhere classified: Secondary | ICD-10-CM

## 2018-02-03 DIAGNOSIS — Z87891 Personal history of nicotine dependence: Secondary | ICD-10-CM

## 2018-02-03 DIAGNOSIS — R0902 Hypoxemia: Secondary | ICD-10-CM

## 2018-02-03 DIAGNOSIS — N183 Chronic kidney disease, stage 3 (moderate): Secondary | ICD-10-CM

## 2018-02-03 DIAGNOSIS — Z952 Presence of prosthetic heart valve: Secondary | ICD-10-CM

## 2018-02-03 DIAGNOSIS — R0602 Shortness of breath: Secondary | ICD-10-CM

## 2018-02-03 DIAGNOSIS — D72829 Elevated white blood cell count, unspecified: Secondary | ICD-10-CM | POA: Diagnosis present

## 2018-02-03 HISTORY — PX: LAPAROSCOPIC GASTRIC SLEEVE RESECTION: SHX5895

## 2018-02-03 LAB — CBC WITH DIFFERENTIAL/PLATELET
Basophils Absolute: 0.1 10*3/uL (ref 0.0–0.1)
Basophils Relative: 0 %
EOS PCT: 0 %
Eosinophils Absolute: 0 10*3/uL (ref 0.0–0.7)
HEMATOCRIT: 27.5 % — AB (ref 36.0–46.0)
HEMOGLOBIN: 8.9 g/dL — AB (ref 12.0–15.0)
LYMPHS PCT: 6 %
Lymphs Abs: 2.4 10*3/uL (ref 0.7–4.0)
MCH: 30.3 pg (ref 26.0–34.0)
MCHC: 32.4 g/dL (ref 30.0–36.0)
MCV: 93.5 fL (ref 78.0–100.0)
MONO ABS: 5.2 10*3/uL (ref 0.1–1.0)
MONOS PCT: 14 %
NEUTROS ABS: 29.7 10*3/uL (ref 1.7–7.7)
NRBC: 5 /100{WBCs} — AB
Neutrophils Relative %: 80 %
PLATELETS: 408 10*3/uL — AB (ref 150–400)
RBC: 2.94 MIL/uL — ABNORMAL LOW (ref 3.87–5.11)
RDW: 17.4 % — ABNORMAL HIGH (ref 11.5–15.5)
WBC: 37.4 10*3/uL — ABNORMAL HIGH (ref 4.0–10.5)

## 2018-02-03 LAB — BASIC METABOLIC PANEL
Anion gap: 14 (ref 5–15)
BUN: 80 mg/dL — ABNORMAL HIGH (ref 6–20)
CALCIUM: 9.2 mg/dL (ref 8.9–10.3)
CHLORIDE: 104 mmol/L (ref 98–111)
CO2: 26 mmol/L (ref 22–32)
CREATININE: 3.91 mg/dL — AB (ref 0.44–1.00)
GFR calc Af Amer: 14 mL/min — ABNORMAL LOW (ref >60)
GFR calc non Af Amer: 12 mL/min — ABNORMAL LOW (ref >60)
GLUCOSE: 165 mg/dL — AB (ref 70–99)
Potassium: 5.4 mmol/L — ABNORMAL HIGH (ref 3.5–5.1)
Sodium: 144 mmol/L (ref 135–145)

## 2018-02-03 LAB — CBC
HCT: 27 % — ABNORMAL LOW (ref 36.0–46.0)
Hemoglobin: 8.8 g/dL — ABNORMAL LOW (ref 12.0–15.0)
MCH: 30.4 pg (ref 26.0–34.0)
MCHC: 32.6 g/dL (ref 30.0–36.0)
MCV: 93.4 fL (ref 78.0–100.0)
Platelets: 267 10*3/uL (ref 150–400)
RBC: 2.89 MIL/uL — AB (ref 3.87–5.11)
RDW: 16.7 % — ABNORMAL HIGH (ref 11.5–15.5)
WBC: 23.7 10*3/uL — ABNORMAL HIGH (ref 4.0–10.5)

## 2018-02-03 LAB — POCT I-STAT 7, (LYTES, BLD GAS, ICA,H+H)
ACID-BASE EXCESS: 1 mmol/L (ref 0.0–2.0)
Bicarbonate: 27.8 mmol/L (ref 20.0–28.0)
CALCIUM ION: 1.21 mmol/L (ref 1.15–1.40)
HEMATOCRIT: 21 % — AB (ref 36.0–46.0)
HEMOGLOBIN: 7.1 g/dL — AB (ref 12.0–15.0)
O2 Saturation: 100 %
PH ART: 7.325 — AB (ref 7.350–7.450)
PO2 ART: 225 mmHg — AB (ref 83.0–108.0)
Potassium: 5.3 mmol/L — ABNORMAL HIGH (ref 3.5–5.1)
SODIUM: 140 mmol/L (ref 135–145)
TCO2: 29 mmol/L (ref 22–32)
pCO2 arterial: 53.3 mmHg — ABNORMAL HIGH (ref 32.0–48.0)

## 2018-02-03 LAB — BLOOD GAS, ARTERIAL
ACID-BASE DEFICIT: 1.6 mmol/L (ref 0.0–2.0)
Acid-base deficit: 1.1 mmol/L (ref 0.0–2.0)
BICARBONATE: 24.6 mmol/L (ref 20.0–28.0)
Bicarbonate: 26.3 mmol/L (ref 20.0–28.0)
Drawn by: 295031
Drawn by: 308601
FIO2: 100
LHR: 16 {breaths}/min
O2 CONTENT: 6 L/min
O2 SAT: 96.3 %
O2 Saturation: 90.6 %
PATIENT TEMPERATURE: 98.6
PCO2 ART: 64.2 mmHg — AB (ref 32.0–48.0)
PEEP/CPAP: 5 cmH2O
PH ART: 7.236 — AB (ref 7.350–7.450)
PO2 ART: 68.5 mmHg — AB (ref 83.0–108.0)
PO2 ART: 84.7 mmHg (ref 83.0–108.0)
Patient temperature: 98.6
VT: 460 mL
pCO2 arterial: 49.3 mmHg — ABNORMAL HIGH (ref 32.0–48.0)
pH, Arterial: 7.318 — ABNORMAL LOW (ref 7.350–7.450)

## 2018-02-03 LAB — COMPREHENSIVE METABOLIC PANEL
ALBUMIN: 3.5 g/dL (ref 3.5–5.0)
ALK PHOS: 65 U/L (ref 38–126)
ALT: 45 U/L — ABNORMAL HIGH (ref 0–44)
ANION GAP: 12 (ref 5–15)
AST: 44 U/L — AB (ref 15–41)
BUN: 73 mg/dL — AB (ref 6–20)
CALCIUM: 9.2 mg/dL (ref 8.9–10.3)
CO2: 26 mmol/L (ref 22–32)
Chloride: 104 mmol/L (ref 98–111)
Creatinine, Ser: 3.65 mg/dL — ABNORMAL HIGH (ref 0.44–1.00)
GFR calc Af Amer: 15 mL/min — ABNORMAL LOW (ref >60)
GFR calc non Af Amer: 13 mL/min — ABNORMAL LOW (ref >60)
GLUCOSE: 172 mg/dL — AB (ref 70–99)
POTASSIUM: 6.4 mmol/L — AB (ref 3.5–5.1)
SODIUM: 142 mmol/L (ref 135–145)
Total Bilirubin: 1.4 mg/dL — ABNORMAL HIGH (ref 0.3–1.2)
Total Protein: 7.3 g/dL (ref 6.5–8.1)

## 2018-02-03 LAB — RENAL FUNCTION PANEL
Albumin: 2.9 g/dL — ABNORMAL LOW (ref 3.5–5.0)
Anion gap: 13 (ref 5–15)
BUN: 75 mg/dL — ABNORMAL HIGH (ref 6–20)
CALCIUM: 9.1 mg/dL (ref 8.9–10.3)
CHLORIDE: 107 mmol/L (ref 98–111)
CO2: 26 mmol/L (ref 22–32)
Creatinine, Ser: 3.33 mg/dL — ABNORMAL HIGH (ref 0.44–1.00)
GFR calc non Af Amer: 14 mL/min — ABNORMAL LOW (ref >60)
GFR, EST AFRICAN AMERICAN: 16 mL/min — AB (ref >60)
Glucose, Bld: 174 mg/dL — ABNORMAL HIGH (ref 70–99)
Phosphorus: 6 mg/dL — ABNORMAL HIGH (ref 2.5–4.6)
Potassium: 5.1 mmol/L (ref 3.5–5.1)
SODIUM: 146 mmol/L — AB (ref 135–145)

## 2018-02-03 LAB — PREPARE RBC (CROSSMATCH)

## 2018-02-03 LAB — HEMOGLOBIN AND HEMATOCRIT, BLOOD
HCT: 23.8 % — ABNORMAL LOW (ref 36.0–46.0)
Hemoglobin: 7.9 g/dL — ABNORMAL LOW (ref 12.0–15.0)

## 2018-02-03 LAB — GLUCOSE, CAPILLARY
GLUCOSE-CAPILLARY: 151 mg/dL — AB (ref 70–99)
GLUCOSE-CAPILLARY: 144 mg/dL — AB (ref 70–99)
GLUCOSE-CAPILLARY: 154 mg/dL — AB (ref 70–99)
Glucose-Capillary: 153 mg/dL — ABNORMAL HIGH (ref 70–99)
Glucose-Capillary: 175 mg/dL — ABNORMAL HIGH (ref 70–99)

## 2018-02-03 LAB — TRIGLYCERIDES: Triglycerides: 231 mg/dL — ABNORMAL HIGH (ref <150)

## 2018-02-03 LAB — PROTIME-INR
INR: 1.6
Prothrombin Time: 18.9 seconds — ABNORMAL HIGH (ref 11.4–15.2)

## 2018-02-03 SURGERY — GASTRECTOMY, SLEEVE, LAPAROSCOPIC
Anesthesia: General | Site: Abdomen

## 2018-02-03 MED ORDER — ROCURONIUM BROMIDE 10 MG/ML (PF) SYRINGE
PREFILLED_SYRINGE | INTRAVENOUS | Status: DC | PRN
  Administered 2018-02-03: 60 mg via INTRAVENOUS
  Administered 2018-02-03: 40 mg via INTRAVENOUS
  Administered 2018-02-03: 20 mg via INTRAVENOUS

## 2018-02-03 MED ORDER — TRAMADOL HCL 50 MG PO TABS: 100.0000 mg | ORAL_TABLET | Freq: Four times a day (QID) | ORAL | Status: DC | PRN

## 2018-02-03 MED ORDER — ROCURONIUM BROMIDE 10 MG/ML (PF) SYRINGE
PREFILLED_SYRINGE | INTRAVENOUS | Status: AC
  Filled 2018-02-03: qty 10

## 2018-02-03 MED ORDER — FLUTICASONE PROPIONATE 50 MCG/ACT NA SUSP
2.0000 | Freq: Every day | NASAL | Status: DC
  Administered 2018-02-04 – 2018-02-11 (×6): 2 via NASAL
  Filled 2018-02-03: qty 16

## 2018-02-03 MED ORDER — ONDANSETRON HCL 4 MG/2ML IJ SOLN
INTRAMUSCULAR | Status: AC
  Filled 2018-02-03: qty 2

## 2018-02-03 MED ORDER — IPRATROPIUM-ALBUTEROL 0.5-2.5 (3) MG/3ML IN SOLN: 3.0000 mL | Freq: Four times a day (QID) | RESPIRATORY_TRACT | Status: DC | PRN

## 2018-02-03 MED ORDER — ACETAMINOPHEN 650 MG RE SUPP: 650.0000 mg | RECTAL | Status: DC | PRN

## 2018-02-03 MED ORDER — FENTANYL CITRATE (PF) 100 MCG/2ML IJ SOLN
25.0000 ug | INTRAMUSCULAR | Status: DC | PRN
  Administered 2018-02-04 – 2018-02-05 (×3): 50 ug via INTRAVENOUS
  Filled 2018-02-03 (×2): qty 2

## 2018-02-03 MED ORDER — DEXAMETHASONE SODIUM PHOSPHATE 4 MG/ML IJ SOLN
INTRAMUSCULAR | Status: DC | PRN
  Administered 2018-02-03: 10 mg via INTRAVENOUS

## 2018-02-03 MED ORDER — SODIUM POLYSTYRENE SULFONATE 15 GM/60ML PO SUSP
60.0000 g | Freq: Once | ORAL | Status: AC
  Administered 2018-02-03: 60 g via RECTAL
  Filled 2018-02-03: qty 240

## 2018-02-03 MED ORDER — ACETAMINOPHEN 325 MG PO TABS: 650.0000 mg | ORAL_TABLET | Freq: Four times a day (QID) | ORAL | Status: DC | PRN

## 2018-02-03 MED ORDER — SODIUM CHLORIDE 0.9 % IJ SOLN
INTRAMUSCULAR | Status: AC
  Filled 2018-02-03: qty 50

## 2018-02-03 MED ORDER — NALOXONE HCL 0.4 MG/ML IJ SOLN
INTRAMUSCULAR | Status: AC
  Administered 2018-02-03: 07:00:00
  Filled 2018-02-03: qty 1

## 2018-02-03 MED ORDER — LACTATED RINGERS IV SOLN
INTRAVENOUS | Status: DC
  Administered 2018-02-03: 15:00:00 via INTRAVENOUS

## 2018-02-03 MED ORDER — LACTATED RINGERS IR SOLN
Status: DC | PRN
  Administered 2018-02-03: 6000 mL

## 2018-02-03 MED ORDER — SUGAMMADEX SODIUM 500 MG/5ML IV SOLN
INTRAVENOUS | Status: AC
  Filled 2018-02-03: qty 5

## 2018-02-03 MED ORDER — IPRATROPIUM BROMIDE 0.02 % IN SOLN: 0.5000 mg | RESPIRATORY_TRACT | Status: DC | PRN

## 2018-02-03 MED ORDER — MIDAZOLAM HCL 5 MG/5ML IJ SOLN
INTRAMUSCULAR | Status: DC | PRN
  Administered 2018-02-03: 2 mg via INTRAVENOUS
  Administered 2018-02-03 (×2): 1 mg via INTRAVENOUS

## 2018-02-03 MED ORDER — FENTANYL CITRATE (PF) 100 MCG/2ML IJ SOLN
INTRAMUSCULAR | Status: DC | PRN
  Administered 2018-02-03 (×2): 50 ug via INTRAVENOUS
  Administered 2018-02-03: 100 ug via INTRAVENOUS

## 2018-02-03 MED ORDER — LACTATED RINGERS IV SOLN
INTRAVENOUS | Status: DC | PRN
  Administered 2018-02-03: 16:00:00 via INTRAVENOUS

## 2018-02-03 MED ORDER — ACETAMINOPHEN 10 MG/ML IV SOLN
1000.0000 mg | Freq: Four times a day (QID) | INTRAVENOUS | Status: AC
  Administered 2018-02-04 (×3): 1000 mg via INTRAVENOUS
  Filled 2018-02-03 (×3): qty 100

## 2018-02-03 MED ORDER — ACETAMINOPHEN 650 MG RE SUPP: 325.0000 mg | RECTAL | Status: DC | PRN

## 2018-02-03 MED ORDER — FENTANYL CITRATE (PF) 100 MCG/2ML IJ SOLN
100.0000 ug | INTRAMUSCULAR | Status: DC | PRN
  Filled 2018-02-03: qty 2

## 2018-02-03 MED ORDER — ARFORMOTEROL TARTRATE 15 MCG/2ML IN NEBU
15.0000 ug | INHALATION_SOLUTION | Freq: Two times a day (BID) | RESPIRATORY_TRACT | Status: DC
  Administered 2018-02-03 – 2018-02-12 (×18): 15 ug via RESPIRATORY_TRACT
  Filled 2018-02-03 (×20): qty 2

## 2018-02-03 MED ORDER — IPRATROPIUM BROMIDE 0.02 % IN SOLN
0.5000 mg | Freq: Four times a day (QID) | RESPIRATORY_TRACT | Status: DC
  Filled 2018-02-03: qty 2.5

## 2018-02-03 MED ORDER — METHYLPREDNISOLONE SODIUM SUCC 125 MG IJ SOLR
80.0000 mg | Freq: Three times a day (TID) | INTRAMUSCULAR | Status: DC
  Administered 2018-02-03: 80 mg via INTRAVENOUS
  Filled 2018-02-03: qty 2

## 2018-02-03 MED ORDER — OXYCODONE HCL 5 MG/5ML PO SOLN: 5.0000 mg | Freq: Once | ORAL | Status: DC | PRN

## 2018-02-03 MED ORDER — MIDAZOLAM HCL 2 MG/2ML IJ SOLN
INTRAMUSCULAR | Status: AC
  Filled 2018-02-03: qty 2

## 2018-02-03 MED ORDER — ONDANSETRON HCL 4 MG/2ML IJ SOLN: 4.0000 mg | Freq: Four times a day (QID) | INTRAMUSCULAR | Status: DC | PRN

## 2018-02-03 MED ORDER — SODIUM CHLORIDE 0.9% IV SOLUTION: Freq: Once | INTRAVENOUS | Status: DC

## 2018-02-03 MED ORDER — LEVALBUTEROL HCL 0.63 MG/3ML IN NEBU: 0.6300 mg | INHALATION_SOLUTION | RESPIRATORY_TRACT | Status: DC | PRN

## 2018-02-03 MED ORDER — SUCCINYLCHOLINE CHLORIDE 20 MG/ML IJ SOLN
INTRAMUSCULAR | Status: DC | PRN
  Administered 2018-02-03: 120 mg via INTRAVENOUS

## 2018-02-03 MED ORDER — TRAMADOL HCL 50 MG PO TABS: 100.0000 mg | ORAL_TABLET | Freq: Two times a day (BID) | ORAL | Status: DC | PRN

## 2018-02-03 MED ORDER — NALOXONE HCL 0.4 MG/ML IJ SOLN
INTRAMUSCULAR | Status: AC
  Administered 2018-02-03: 0.2 mg
  Filled 2018-02-03: qty 1

## 2018-02-03 MED ORDER — SODIUM CHLORIDE 0.9 % IJ SOLN
INTRAMUSCULAR | Status: DC | PRN
  Administered 2018-02-03: 50 mL

## 2018-02-03 MED ORDER — BUDESONIDE 0.5 MG/2ML IN SUSP
0.5000 mg | Freq: Two times a day (BID) | RESPIRATORY_TRACT | Status: DC
  Administered 2018-02-03 – 2018-02-12 (×18): 0.5 mg via RESPIRATORY_TRACT
  Filled 2018-02-03 (×20): qty 2

## 2018-02-03 MED ORDER — OXYCODONE HCL 5 MG PO TABS: 5.0000 mg | ORAL_TABLET | Freq: Once | ORAL | Status: DC | PRN

## 2018-02-03 MED ORDER — PROPOFOL 1000 MG/100ML IV EMUL
5.0000 ug/kg/min | INTRAVENOUS | Status: DC
  Administered 2018-02-03 (×2): 30 ug/kg/min via INTRAVENOUS
  Administered 2018-02-04: 27.9 ug/kg/min via INTRAVENOUS
  Administered 2018-02-04: 25 ug/kg/min via INTRAVENOUS
  Administered 2018-02-04: 30 ug/kg/min via INTRAVENOUS
  Administered 2018-02-04: 30.019 ug/kg/min via INTRAVENOUS
  Administered 2018-02-04 (×3): 30 ug/kg/min via INTRAVENOUS
  Administered 2018-02-05: 40 ug/kg/min via INTRAVENOUS
  Administered 2018-02-05: 30 ug/kg/min via INTRAVENOUS
  Administered 2018-02-05: 40 ug/kg/min via INTRAVENOUS
  Filled 2018-02-03 (×11): qty 100

## 2018-02-03 MED ORDER — LEVALBUTEROL HCL 0.63 MG/3ML IN NEBU
0.6300 mg | INHALATION_SOLUTION | Freq: Four times a day (QID) | RESPIRATORY_TRACT | Status: DC
  Filled 2018-02-03: qty 3

## 2018-02-03 MED ORDER — TRAMADOL HCL 50 MG PO TABS
50.0000 mg | ORAL_TABLET | Freq: Four times a day (QID) | ORAL | Status: DC | PRN
  Administered 2018-02-03: 50 mg via ORAL
  Filled 2018-02-03: qty 1

## 2018-02-03 MED ORDER — PROPOFOL 1000 MG/100ML IV EMUL
INTRAVENOUS | Status: AC
  Administered 2018-02-03: 30 ug/kg/min via INTRAVENOUS
  Filled 2018-02-03: qty 100

## 2018-02-03 MED ORDER — FENTANYL CITRATE (PF) 250 MCG/5ML IJ SOLN
INTRAMUSCULAR | Status: AC
  Filled 2018-02-03: qty 5

## 2018-02-03 MED ORDER — PROPOFOL 10 MG/ML IV BOLUS
INTRAVENOUS | Status: AC
  Filled 2018-02-03: qty 20

## 2018-02-03 MED ORDER — BUPIVACAINE LIPOSOME 1.3 % IJ SUSP
20.0000 mL | Freq: Once | INTRAMUSCULAR | Status: AC
  Administered 2018-02-03: 20 mL
  Filled 2018-02-03: qty 20

## 2018-02-03 MED ORDER — ETOMIDATE 2 MG/ML IV SOLN
INTRAVENOUS | Status: DC | PRN
  Administered 2018-02-03: 20 mg via INTRAVENOUS

## 2018-02-03 MED ORDER — ACETAMINOPHEN 10 MG/ML IV SOLN
1000.0000 mg | Freq: Four times a day (QID) | INTRAVENOUS | Status: DC
  Filled 2018-02-03: qty 100

## 2018-02-03 MED ORDER — SUGAMMADEX SODIUM 500 MG/5ML IV SOLN
INTRAVENOUS | Status: DC | PRN
  Administered 2018-02-03: 500 mg via INTRAVENOUS

## 2018-02-03 MED ORDER — LIDOCAINE HCL (CARDIAC) PF 100 MG/5ML IV SOSY
PREFILLED_SYRINGE | INTRAVENOUS | Status: DC | PRN
  Administered 2018-02-03: 80 mg via INTRAVENOUS

## 2018-02-03 MED ORDER — MIDAZOLAM HCL 2 MG/2ML IJ SOLN: 2.0000 mg | INTRAMUSCULAR | Status: DC | PRN

## 2018-02-03 MED ORDER — SODIUM CHLORIDE 0.9 % IV SOLN
INTRAVENOUS | Status: DC | PRN
  Administered 2018-02-03: 17:00:00 via INTRAVENOUS

## 2018-02-03 MED ORDER — FENTANYL CITRATE (PF) 100 MCG/2ML IJ SOLN
100.0000 ug | INTRAMUSCULAR | Status: DC | PRN
  Administered 2018-02-04: 100 ug via INTRAVENOUS

## 2018-02-03 MED ORDER — LORATADINE 10 MG PO TABS
10.0000 mg | ORAL_TABLET | Freq: Every day | ORAL | Status: DC
  Filled 2018-02-03: qty 1

## 2018-02-03 MED ORDER — TISSEEL VH 10 ML EX KIT
PACK | CUTANEOUS | Status: AC
  Filled 2018-02-03: qty 1

## 2018-02-03 SURGICAL SUPPLY — 74 items
APPLICATOR COTTON TIP 6 STRL (MISCELLANEOUS) ×2 IMPLANT
APPLICATOR COTTON TIP 6IN STRL (MISCELLANEOUS) ×6
APPLIER CLIP ROT 10 11.4 M/L (STAPLE)
APPLIER CLIP ROT 13.4 12 LRG (CLIP) ×3
BANDAGE ADH SHEER 1  50/CT (GAUZE/BANDAGES/DRESSINGS) IMPLANT
BENZOIN TINCTURE PRP APPL 2/3 (GAUZE/BANDAGES/DRESSINGS) IMPLANT
BLADE SURG SZ11 CARB STEEL (BLADE) ×3 IMPLANT
CABLE HIGH FREQUENCY MONO STRZ (ELECTRODE) ×3 IMPLANT
CHLORAPREP W/TINT 26ML (MISCELLANEOUS) ×12 IMPLANT
CLIP APPLIE ROT 10 11.4 M/L (STAPLE) IMPLANT
CLIP APPLIE ROT 13.4 12 LRG (CLIP) ×1 IMPLANT
CLOSURE WOUND 1/2 X4 (GAUZE/BANDAGES/DRESSINGS)
COVER SURGICAL LIGHT HANDLE (MISCELLANEOUS) ×3 IMPLANT
DECANTER SPIKE VIAL GLASS SM (MISCELLANEOUS) ×3 IMPLANT
DEVICE SUT QUICK LOAD TK 5 (STAPLE) IMPLANT
DEVICE SUT TI-KNOT TK 5X26 (MISCELLANEOUS) IMPLANT
DEVICE SUTURE ENDOST 10MM (ENDOMECHANICALS) IMPLANT
DEVICE TI KNOT TK5 (MISCELLANEOUS)
DISSECTOR BLUNT TIP ENDO 5MM (MISCELLANEOUS) IMPLANT
DRAPE UTILITY XL STRL (DRAPES) ×6 IMPLANT
ELECT L-HOOK LAP 45CM DISP (ELECTROSURGICAL)
ELECT PENCIL ROCKER SW 15FT (MISCELLANEOUS) IMPLANT
ELECT REM PT RETURN 15FT ADLT (MISCELLANEOUS) ×3 IMPLANT
ELECTRODE L-HOOK LAP 45CM DISP (ELECTROSURGICAL) IMPLANT
GAUZE SPONGE 2X2 8PLY STRL LF (GAUZE/BANDAGES/DRESSINGS) IMPLANT
GAUZE SPONGE 4X4 12PLY STRL (GAUZE/BANDAGES/DRESSINGS) IMPLANT
GLOVE BIO SURGEON STRL SZ7.5 (GLOVE) ×30 IMPLANT
GLOVE INDICATOR 8.0 STRL GRN (GLOVE) ×3 IMPLANT
GOWN STRL REUS W/TWL XL LVL3 (GOWN DISPOSABLE) ×12 IMPLANT
GRASPER SUT TROCAR 14GX15 (MISCELLANEOUS) IMPLANT
HOVERMATT SINGLE USE (MISCELLANEOUS) ×3 IMPLANT
KIT BASIN OR (CUSTOM PROCEDURE TRAY) ×3 IMPLANT
MARKER SKIN DUAL TIP RULER LAB (MISCELLANEOUS) ×3 IMPLANT
NEEDLE SPNL 22GX3.5 QUINCKE BK (NEEDLE) ×3 IMPLANT
PACK UNIVERSAL I (CUSTOM PROCEDURE TRAY) ×3 IMPLANT
QUICK LOAD TK 5 (STAPLE)
RELOAD STAPLER 60MM BLK (STAPLE) IMPLANT
RELOAD STAPLER BLUE 60MM (STAPLE) IMPLANT
RELOAD STAPLER GOLD 60MM (STAPLE) IMPLANT
RELOAD STAPLER GREEN 60MM (STAPLE) IMPLANT
SCISSORS LAP 5X45 EPIX DISP (ENDOMECHANICALS) IMPLANT
SEALANT SURGICAL APPL DUAL CAN (MISCELLANEOUS) IMPLANT
SET IRRIG TUBING LAPAROSCOPIC (IRRIGATION / IRRIGATOR) ×3 IMPLANT
SHEARS HARMONIC ACE PLUS 45CM (MISCELLANEOUS) IMPLANT
SLEEVE ADV FIXATION 12X100MM (TROCAR) ×3 IMPLANT
SLEEVE GASTRECTOMY 40FR VISIGI (MISCELLANEOUS) IMPLANT
SLEEVE XCEL OPT CAN 5 100 (ENDOMECHANICALS) ×6 IMPLANT
SOLUTION ANTI FOG 6CC (MISCELLANEOUS) ×3 IMPLANT
SPONGE GAUZE 2X2 STER 10/PKG (GAUZE/BANDAGES/DRESSINGS)
SPONGE LAP 18X18 RF (DISPOSABLE) ×3 IMPLANT
STAPLER ECHELON BIOABSB 60 FLE (MISCELLANEOUS) IMPLANT
STAPLER ECHELON LONG 60 440 (INSTRUMENTS) IMPLANT
STAPLER RELOAD 60MM BLK (STAPLE)
STAPLER RELOAD BLUE 60MM (STAPLE)
STAPLER RELOAD GOLD 60MM (STAPLE)
STAPLER RELOAD GREEN 60MM (STAPLE)
STRIP CLOSURE SKIN 1/2X4 (GAUZE/BANDAGES/DRESSINGS) IMPLANT
SUT MNCRL AB 4-0 PS2 18 (SUTURE) ×3 IMPLANT
SUT SURGIDAC NAB ES-9 0 48 120 (SUTURE) IMPLANT
SUT VIC AB 3-0 SH 18 (SUTURE) ×3 IMPLANT
SUT VICRYL 0 TIES 12 18 (SUTURE) IMPLANT
SYR 20CC LL (SYRINGE) ×3 IMPLANT
SYR 50ML LL SCALE MARK (SYRINGE) ×3 IMPLANT
TOWEL OR 17X26 10 PK STRL BLUE (TOWEL DISPOSABLE) ×3 IMPLANT
TOWEL OR NON WOVEN STRL DISP B (DISPOSABLE) ×3 IMPLANT
TRAP SPECIMEN MUCOUS 40CC (MISCELLANEOUS) ×3 IMPLANT
TROCAR ADV FIXATION 12X100MM (TROCAR) ×3 IMPLANT
TROCAR BLADELESS 15MM (ENDOMECHANICALS) IMPLANT
TROCAR BLADELESS OPT 5 100 (ENDOMECHANICALS) ×3 IMPLANT
TROCAR XCEL 12X100 BLDLESS (ENDOMECHANICALS) ×3 IMPLANT
TUBING CONNECTING 10 (TUBING) ×2 IMPLANT
TUBING CONNECTING 10' (TUBING) ×1
TUBING ENDO SMARTCAP (MISCELLANEOUS) IMPLANT
TUBING INSUF HEATED (TUBING) ×3 IMPLANT

## 2018-02-03 NOTE — Op Note (Addendum)
02/03/2018  6:09 PM  PATIENT:  [name]  57 y.o. female  PRE-OPERATIVE DIAGNOSIS: acute blood loss anemia, intra-abdominal hematoma, leukocytosis, s/p laparoscopic sleeve gastrectomy with hiatal hernia repair 01/24/2018 Severe obesity BMI 55   Essential hypertension   Hyperlipidemia   ILD (interstitial lung disease) ([place])   History of mitral valve replacement with mechanical valve   Acute on CKD (chronic kidney disease) stage 3, GFR 30-59 ml/min (HCC)   Obstructive sleep apnea   Chronic acquired lymphedema - Right lower extremity   Hx of mechanical aortic valve replacement   POST-OPERATIVE DIAGNOSIS:  same  PROCEDURE:  Procedure(s): DIAGNOSTIC LAPAROSCOPY; LAPAROSCOPIC IRRIGATION  & WASHOUT OF ABDOMINAL BLOOD/HEMATOMA  SURGEON:  Surgeon(s): Greer Pickerel, MD  ASSISTANTS: Alphonsa Overall MD FACS   ANESTHESIA:   general  DRAINS: Urinary Catheter (Foley)   LOCAL MEDICATIONS USED:  OTHER exparel  SPECIMEN:  Source of Specimen:  intra-abdominal blood  BLOOD LOSS: 50cc  BLOOD ADMINISTERED: 2u prbc due to starting hgb of 7.9  IRRIGATION: 8700 cc  FLUID EVACUATED: 10,600cc (1900 cc old blood evacuated)  DISPOSITION OF SPECIMEN:  micro  COUNTS:  YES  INDICATION FOR PROCEDURE: 57 year old severely obese Caucasian female with double mechanical valves on chronic anticoagulation, chronic kidney disease, home oxygen underwent laparoscopic sleeve gastrectomy with repair of a small hiatal hernia on January 24, 2018.  She was bridged preoperatively with therapeutic Lovenox while her Coumadin was being held.  Postoperatively she was started on a heparin drip but unfortunately she bled and her heparin drip had to be stopped.  Her hemoglobin normalized and heparin drip was restarted and oral Coumadin was started.  She did have a bump in her creatinine which ended up normalizing.  After being on heparin drip for several days without any signs of bleeding she was discharged on therapeutic  Lovenox while waiting for her Coumadin became therapeutic.  She was readmitted on late Wednesday night of this week with abdominal discomfort.  She was found to have evidence of recurrent bleeding.  Her white blood cell count had increased as well.  A CT scan was performed which demonstrated several intra-abdominal hematomas as well as fluid in the abdomen consistent with old blood.  The surgical staple line no longer sleeve appeared intact without any evidence of dehiscence or pneumoperitoneum or air within the fluid collections.  She was empirically started on Zosyn.  She was transfused with blood.  She also had acute kidney injury as well.  She required 4 units of blood transfusion before she responded with an increase in her hemoglobin.  Overnight her she spiked a temperature to 102.9 and her white blood cell count went up even further this morning.  Initially her hemoglobin had stabilized but this afternoon it drifted back down.  Because of ongoing bleeding, worsening leukocytosis and fever I recommended return to the operating room for diagnostic laparoscopy.  Please see preoperative notes for additional details regarding the conversation had with the patient and her family preoperatively.  PROCEDURE: After obtaining informed consent she was taken to operating room 2, placed supine on the operating room table and general endotracheal anesthesia was established without any hypotension or signs of aspiration.  Anesthesia placed a right internal jugular central line & placed a left radial arterial line under ultrasound guidance.  A Foley catheter had been placed in the ICU.  Her abdomen is prepped and draped in the usual standard surgical fashion with ChloraPrep.  She was on therapeutic antibiotics.  A surgical timeout was  performed.  Using Optiview technique I incised her old left upper quadrant incision, then using a 0 degree 5 mm laparoscope through a 5 mm trocar I advanced it through all layers of the  abdominal wall under direct visualization and entered the abdominal cavity.  Pneumoperitoneum was established.  We initially had great difficulty in establishing working room.  I was able to place an additional 5 mm trocar in an old trocar site just above into the left of the umbilicus.  There was evidence of blood above the liver and in the left upper quadrant and in the left paracolic gutter.  There was some hematoma as well.  I evacuated some of the old blood and send it to microbiology for analysis.  I ended up placing a 5 mm trocar in the left mid abdomen under direct visualization.  I ended up changing the initial left upper quadrant trocar to a 10 mm trocar in order to accommodate a larger suction irrigator catheter.  We ended up placing a 10 mm trocar in the right upper quadrant as well.  There was no evidence of purulence in the abdomen.  There is no evidence of enteric contents.  Most of this appeared to be old blood with some components of fresh blood.  Once I evacuated some of the blood above the liver we had more working room and were able to get a better visualization of the abdomen.  The left lobe of the liver was lifted up to expose the sleeve.  There is no evidence of bleeding from the liver.  There was some clot along the sleeve but nothing significant.  I could not directly visualize the sleeve staple line because the area appeared stuck down but I did not feel that it was worth the wrist to try to disrupt this area.  We then placed the patient in Trendelenburg and evacuated a fair amount of blood from the pelvis as well as a large thick fibrinous clot.  There were 2 fibrinous clots in the left paracolic gutter and in the pelvis which took some time to remove.  We did visualize what appeared to be an intra-abdominal hematoma above the distal transverse colon mesentery and the lesser sac.  I was finally able to lift the omentum up in a safe manner to expose the small intestine.  The small intestine  was a little bit dilated but it appeared viable without any signs of hyperemia or poor perfusion.  There was no significant mesenteric hematoma in this location.  We did a combination of irrigation and evacuation of fluid in order to break up clot in the pelvis and again up in the right upper quadrant above the liver.  We then returned in a somewhat reverse Trendelenburg position.  I did not feel that I could safely get to the hematoma in the lower lesser sac.  At this point we had almost irrigated 9 L of fluid and I was able to remove about 1900 cc of old blood.  Again there is no evidence of enteric contents or purulence.  I did not see an overt area of bleeding.  At this point I placed Exparel in bilateral lateral abdominal walls as a tap block.  At this point after discussion with Dr. Lucia Gaskins we both felt that there were no additional maneuvers to perform.  Trochars were removed.  I did place 2 of the larger incisions with 3-0 Vicryl in the deep dermis.  All skin incisions were then closed with a  4 Monocryl in a subcuticular fashion followed by the application of Dermabond.  All needle, instrument, and sponge counts were correct x2.  The patient tolerated the procedure well without any need for vasopressors.  Updated family at end of case and discussed case with CCM attending and ICU nurse  PLAN OF CARE: inpt  PATIENT DISPOSITION:  ICU - intubated and hemodynamically stable.   Delay start of Pharmacological VTE agent (>24hrs) due to surgical blood loss or risk of bleeding:  yes  [name]. [name], MD, FACS General, Bariatric, & Minimally Invasive Surgery [facility], [place]

## 2018-02-03 NOTE — Anesthesia Procedure Notes (Addendum)
Central Venous Catheter Insertion Performed by: Albertha Ghee, MD, anesthesiologist Start/End9/10/2017 3:50 PM, 02/03/2018 4:00 PM Patient location: Pre-op. Preanesthetic checklist: patient identified, IV checked, site marked, risks and benefits discussed, surgical consent, monitors and equipment checked, pre-op evaluation, timeout performed and anesthesia consent Position: Trendelenburg Patient sedated Hand hygiene performed , maximum sterile barriers used  and Seldinger technique used Catheter size: 8 Fr Central line was placed.Double lumen Procedure performed using ultrasound guided technique. Ultrasound Notes:anatomy identified, needle tip was noted to be adjacent to the nerve/plexus identified, no ultrasound evidence of intravascular and/or intraneural injection and image(s) printed for medical record Attempts: 1 Following insertion, line sutured, dressing applied and Biopatch. Post procedure assessment: blood return through all ports, free fluid flow and no air  Patient tolerated the procedure well with no immediate complications.

## 2018-02-03 NOTE — Progress Notes (Signed)
PT Cancellation Note  Patient Details Name: [name] MRN: [id number] DOB: May 23, 1961   Cancelled Treatment:    Reason Eval/Treat Not Completed: Medical issues which prohibited therapy; RN reports concern for sepsis and pt continues with high potassium.  Will attempt another day.   Reginia Naas 02/03/2018, 12:27 PM Magda Kiel, [place] 02/03/2018

## 2018-02-03 NOTE — Progress Notes (Signed)
Patient ID: [name], female   DOB: [birth year], 57 y.o.   MRN: [id number]   Progress Note: Metabolic and Bariatric Surgery Service   Chief Complaint/Subjective: Afternoon hgb decreased  No n/v. No sob.   Objective: Vital signs in last 24 hours: Temp:  [98 F (36.7 C)-102.9 F (39.4 C)] 98.7 F (37.1 C) (09/06 1200) Pulse Rate:  [90-114] 103 (09/06 1458) Resp:  [14-26] 16 (09/06 1049) BP: (88-152)/(34-97) 117/51 (09/06 1200) SpO2:  [87 %-99 %] 93 % (09/06 1458) FiO2 (%):  [50 %] 50 % (09/06 1049) Weight:  [154.9 kg] 154.9 kg (09/06 0332) Last BM Date: 02/03/18  Intake/Output from previous day: 09/05 0701 - 09/06 0700 In: 1093.9 [Blood:973.4; IV Piggyback:120.5] Out: 1050 [Urine:1050] Intake/Output this shift: Total I/O In: 69.7 [IV Piggyback:69.7] Out: 100 [Urine:100]  Lungs: cta ant  Cardiovascular: reg, +click  Abd: obese, extensive bruising, mostly nontender (unchanged)  Extremities: chronic RLE lymphedema, no LLE edema  Neuro: awake.alert, not somnolent  Lab Results: CBC  Recent Labs    02/02/18 0925  02/03/18 0323 02/03/18 1325  WBC 27.8*  --  37.4*  --   HGB 7.7*   < > 8.9* 7.9*  HCT 23.2*   < > 27.5* 23.8*  PLT 398  --  408*  --    < > = values in this interval not displayed.   BMET Recent Labs    02/03/18 0323 02/03/18 1325  NA 142 144  K 6.4* 5.4*  CL 104 104  CO2 26 26  GLUCOSE 172* 165*  BUN 73* 80*  CREATININE 3.65* 3.91*  CALCIUM 9.2 9.2   PT/INR Recent Labs    02/02/18 0357 02/03/18 0323  LABPROT 23.3* 18.9*  INR 2.08 1.60   ABG Recent Labs    02/03/18 0800  PHART 7.236*  HCO3 26.3    Studies/Results:  Anti-infectives: Anti-infectives (From admission, onward)   Start     Dose/Rate Route Frequency Ordered Stop   02/02/18 0800  [MAR Hold]  piperacillin-tazobactam (ZOSYN) IVPB 3.375 g     (MAR Hold since Fri 02/03/2018 at 1438. Reason: Transfer to a Procedural area.)   3.375 g 12.5 mL/hr over 240 Minutes  Intravenous Every 8 hours 02/02/18 0300     02/01/18 2245  piperacillin-tazobactam (ZOSYN) IVPB 3.375 g     3.375 g 100 mL/hr over 30 Minutes Intravenous NOW 02/01/18 2234 02/02/18 0203      Medications: Scheduled Meds: . [MAR Hold] sodium chloride   Intravenous Once  . [MAR Hold] arformoterol  15 mcg Nebulization BID  . [MAR Hold] budesonide (PULMICORT) nebulizer solution  0.5 mg Nebulization BID  . bupivacaine liposome  20 mL Infiltration Once  . [MAR Hold] chlorhexidine  15 mL Mouth Rinse BID  . [MAR Hold] fluticasone  2 spray Each Nare Daily  . [MAR Hold] gabapentin  200 mg Oral BID  . [MAR Hold] insulin aspart  0-9 Units Subcutaneous Q4H  . [MAR Hold] loratadine  10 mg Oral Daily  . [MAR Hold] mouth rinse  15 mL Mouth Rinse q12n4p  . [MAR Hold] metoprolol tartrate  2.5 mg Intravenous Q6H  . [MAR Hold] pantoprazole (PROTONIX) IV  40 mg Intravenous Daily  . [MAR Hold] protein supplement shake  6 oz Oral QID   Continuous Infusions: . acetaminophen    . lactated ringers    . [MAR Hold] ondansetron (ZOFRAN) IV    . [MAR Hold] piperacillin-tazobactam (ZOSYN)  IV Stopped (02/03/18 1352)   PRN Meds:.[MAR Hold] acetaminophen **  OR** [MAR Hold] acetaminophen, [MAR Hold] alum & mag hydroxide-simeth, [MAR Hold] hydrocortisone, [MAR Hold] hydrocortisone cream, [MAR Hold] ipratropium-albuterol, [MAR Hold] magic mouthwash, [MAR Hold] metoprolol tartrate, [MAR Hold] ondansetron (ZOFRAN) IV **OR** [MAR Hold] ondansetron (ZOFRAN) IV, [MAR Hold] ondansetron **OR** [DISCONTINUED] ondansetron (ZOFRAN) IV, [MAR Hold] phenol, [MAR Hold] traMADol  Assessment/Plan: Patient Active Problem List   Diagnosis Date Noted  . Acute on chronic respiratory failure with hypoxia and hypercapnia ([place]) 02/03/2018  . Acute metabolic encephalopathy 36/62/9476  . Fever 02/03/2018  . Leukocytosis 02/03/2018  . Abdominal pain 02/02/2018  . Hyperkalemia 02/02/2018  . Acute kidney injury superimposed on CKD ([place])  02/02/2018  . Mesenteric hematoma 02/02/2018  . Hemoperitoneum 02/02/2018  . Anemia   . 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 02/01/2018  . Postoperative anemia due to chronic blood loss on full anticoagulation 02/01/2018  . AKI (acute kidney injury) ([place]) 02/01/2018  . Acute renal failure superimposed on stage 3 chronic kidney disease ([place]) 01/26/2018  . PAF (paroxysmal atrial fibrillation) ([place])   . Acute respiratory failure ([place])   . Hypoxia   . Postprocedural hypotension   . Chronic acquired lymphedema - Right lower extremity 01/24/2018  . Hx of mechanical aortic valve replacement 01/24/2018  . Obstructive sleep apnea 02/03/2017  . Chronic diastolic heart failure ([place]) 01/11/2017  . Morbid obesity with BMI of 50.0-59.9, adult ([place]) 02/11/2016  . HX: long term anticoagulant use 02/11/2016  . Chronic respiratory failure ([place]) 08/12/2015  . 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 Allergic rhinitis 05/02/2015  . Symptomatic anemia 04/08/2015  . History of mitral valve replacement with mechanical valve 04/08/2015  . CKD (chronic kidney disease) stage 3, GFR 30-59 ml/min (HCC) 04/08/2015  . Atrial flutter, unspecified   . Chronic anticoagulation 09/04/2014  . Warfarin anticoagulation 08/14/2014  . ILD (interstitial lung disease) ([place]) 10/12/2013  . Aortic stenosis 10/02/2013  . Hyperlipidemia 10/02/2013  . Chronic asthmatic bronchitis ([place]) 08/06/2013  . Gout 04/16/2011  . Essential hypertension 10/07/2010  . Depressive disorder, not elsewhere classified 10/07/2010   Her color is better and vitals remain normal however her hgb drifted down again.   Long conversation with patient, daughter, and sister  I believe the best option at this point is to return to the operating room for diagnostic laparoscopy, aspiration-evacuation of hematoma, washout.  And to look for source of bleeding given ongoing blood loss.  Even though she is hemodynamically stable I believe the best option in order to  get her back to an anticoagulated status is to take her to the operating room and see if we can see a source of bleeding.  I discussed with patient and family.there may not be an obvious source.  My suspicion for a leak or bowel injury is quite low but nonetheless we will inspect the sleeve staple line.  Infectious disease believes the white blood cell count and fever is more than likely coming from the abdomen.  Hopefully by evacuating the clot in washing out and this will help with her fever curve and leukocytosis as well as definitively rule out a leak.  We discussed that there are risks to going back to the operating room such as bleeding, infection, injury to surrounding structures, blood clot formation, stroke, cardiac and pulmonary events, need for additional procedures.  We discussed the possibility that she may need to remain intubated at the end of surgery and need additional blood products.  However I believe the benefits outweigh the risk at this point.  I discussed this case  with Dr. Lucia Gaskins and Dr. Hassell Done.  I have also discussed my plans with anesthesia as well as the need to be prepared for hypotension on induction as well as potential aspiration.  Critical care medicine attending has been updated as well along with the tried hospitalist  We will send some of the hematoma to microbiology  [name]. [name], MD, FACS General, Bariatric, & Minimally Invasive Surgery [facility]    LOS: 1 day    Greer Pickerel, MD [phone] [facility]

## 2018-02-03 NOTE — Progress Notes (Signed)
Progress Note  Patient Name: [name] Date of Encounter: 02/03/2018  Primary Cardiologist: Dr. Daneen Schick, MD  Subjective   Pt doing much worse today. PCCM has now been consulted for Bipap.   Inpatient Medications    Scheduled Meds: . acetaminophen  325 mg Rectal Once  . chlorhexidine  15 mL Mouth Rinse BID  . gabapentin  200 mg Oral BID  . insulin aspart  0-9 Units Subcutaneous Q4H  . mouth rinse  15 mL Mouth Rinse q12n4p  . metoprolol tartrate  2.5 mg Intravenous Q6H  . mometasone-formoterol  2 puff Inhalation BID  . pantoprazole (PROTONIX) IV  40 mg Intravenous Daily  . protein supplement shake  6 oz Oral QID  . sodium polystyrene  60 g Rectal Once   Continuous Infusions: . methocarbamol (ROBAXIN) IV    . ondansetron (ZOFRAN) IV    . piperacillin-tazobactam (ZOSYN)  IV 3.375 g (02/03/18 0824)   PRN Meds: alum & mag hydroxide-simeth, hydrocortisone, hydrocortisone cream, magic mouthwash, methocarbamol (ROBAXIN) IV, metoprolol tartrate, ondansetron (ZOFRAN) IV **OR** ondansetron (ZOFRAN) IV, ondansetron **OR** [DISCONTINUED] ondansetron (ZOFRAN) IV, phenol, traMADol   Vital Signs    Vitals:   02/03/18 0600 02/03/18 0655 02/03/18 0700 02/03/18 0746  BP: (!) 146/67  129/66   Pulse: 100 (!) 101 (!) 101   Resp: 18 17 15    Temp:  (!) 102.9 F (39.4 C)  (!) 100.5 F (38.1 C)  TempSrc:  Axillary  Axillary  SpO2: 92% (!) 89% (!) 87%   Weight:      Height:        Intake/Output Summary (Last 24 hours) at 02/03/2018 0827 Last data filed at 02/03/2018 0730 Gross per 24 hour  Intake 848.38 ml  Output 1050 ml  Net -201.62 ml   Filed Weights   02/01/18 2028 02/02/18 0423 02/03/18 0332  Weight: (!) 154.2 kg (!) 153.6 kg (!) 154.9 kg    Physical Exam   General: Obese, ill-appearing Skin: Warm, dry, intact  Head: Normocephalic, atraumatic,  clear, moist mucus membranes. Neck: Negative for carotid bruits. No JVD Lungs:Clear to ausculation bilaterally. No  wheezes, rales, or rhonchi. Breathing is unlabored. Cardiovascular: RRR with S1 S2. No murmurs, rubs, gallops, or LV heave appreciated. Abdomen: Firm, tender, distended.   Extremities: BLE edema R>L. No clubbing or cyanosis. DP/PT pulses 1+ bilaterally Neuro: Lethargic. No focal deficits. No facial asymmetry. MAE spontaneously. Psych: Responds to questions appropriately with normal affect.    Labs    Chemistry Recent Labs  Lab 02/01/18 2056  02/02/18 0925 02/02/18 2121 02/03/18 0323  NA 141   < > 142 142 142  K 6.6*   < > 6.7* 6.5* 6.4*  CL 108   < > 106 105 104  CO2 20*   < > 23 25 26   GLUCOSE 258*   < > 164* 166* 172*  BUN 47*   < > 58* 65* 73*  CREATININE 2.53*   < > 3.02* 3.34* 3.65*  CALCIUM 9.4   < > 9.3 9.4 9.2  PROT 6.6  --   --   --  7.3  ALBUMIN 3.2*  --  3.2*  --  3.5  AST 36  --   --   --  44*  ALT 26  --   --   --  45*  ALKPHOS 59  --   --   --  65  BILITOT 1.2  --   --   --  1.4*  GFRNONAA 20*   < > 16* 14* 13*  GFRAA 23*   < > 19* 16* 15*  ANIONGAP 13   < > 13 12 12    < > = values in this interval not displayed.     Hematology Recent Labs  Lab 02/02/18 0357 02/02/18 0925 02/02/18 2121 02/03/18 0323  WBC 27.5* 27.8*  --  37.4*  RBC 2.19* 2.49*  --  2.94*  HGB 6.8* 7.7* 9.2* 8.9*  HCT 20.9* 23.2* 27.8* 27.5*  MCV 95.4 93.2  --  93.5  MCH 31.1 30.9  --  30.3  MCHC 32.5 33.2  --  32.4  RDW 17.4* 16.9*  --  17.4*  PLT 392 398  --  408*    Cardiac EnzymesNo results for input(s): TROPONINI in the last 168 hours. No results for input(s): TROPIPOC in the last 168 hours.   BNPNo results for input(s): BNP, PROBNP in the last 168 hours.   DDimer No results for input(s): DDIMER in the last 168 hours.   Radiology    Ct Abdomen Pelvis Wo Contrast  Result Date: 02/01/2018 CLINICAL DATA:  57 year old female status post gastric sleeve procedure last Tuesday presents with abdominal pain, distention and diarrhea. Bruising along the abdominal. Patient is pale  and there is concern for retroperitoneal hemorrhage. EXAM: CT ABDOMEN AND PELVIS WITHOUT CONTRAST TECHNIQUE: Multidetector CT imaging of the abdomen and pelvis was performed following the standard protocol without IV contrast. COMPARISON:  None. FINDINGS: Assessment of solid organ injury/laceration is limited due to lack of IV contrast. Lower chest: Heart size is enlarged without pericardial effusion. Evidence of prior median sternotomy and mitral valvular repair of the included heart. Subsegmental atelectasis is seen at the lung bases and/or scarring. No effusion or pneumothorax. Hepatobiliary: The unenhanced liver is unremarkable. No biliary dilatation. Nondistended gallbladder free of stones. Hyperdense appearance of the bile within the gallbladder may be secondary to hemolysis. No subcapsular fluid about the liver. Pancreas: Normal Spleen: Normal.  No subcapsular fluid. Adrenals/Urinary Tract: Normal bilateral adrenal glands. Small rounded cortical hyperdensities may represent tiny proteinaceous or hemorrhagic cysts. No nephrolithiasis nor obstructive uropathy. The urinary bladder is unremarkable. Stomach/Bowel: Staple line across the stomach from gastric sleeve procedure. No adjacent pneumoperitoneum or evidence of staple line dehiscence. Normal small bowel rotation. No bowel obstruction. The appendix is not confidently identified. Vascular/Lymphatic: Mild aortic atherosclerosis without aneurysm. No lymphadenopathy. Reproductive: Retroverted appearing uterus.  No adnexal mass. Other: Localized hematomas are identified in the left upper quadrant of the abdomen with hematocrit levels ranging in 2.7 cm and 2.9 cm in the subdiaphragmatic portion of the left upper quadrant with two larger hematomas measuring 8.3 x 7.5 x 8.3 cm on series 2/24 and similar-sized hematoma further caudad measuring 8.3 x 7.8 x 9.1 cm with demonstrable hematocrit level, series 2/45. Moderate to large volume of hemoperitoneum without active  hemorrhage is identified outlining the liver and spleen and extending into the pelvis along the paracolic gutters. Diffuse soft tissue induration and edema is noted of the abdominal and pelvic wall compatible with history of bruising. No retroperitoneal hemorrhage is identified. No free air is identified. No apparent abscess. Musculoskeletal: Thoracolumbar spondylosis extending to the L2 level with marked disc space narrowing and degenerative disc disease. Disc bulge L4-5. No acute nor aggressive osseous lesions. IMPRESSION: 1. Fluid-hematocrit levels are identified within contained omental or mesenteric hematomas in the left hemiabdomen the largest measuring approximately 8.3 x 7.8 x 9.1 cm. 2. Complex non simple fluid is also seen,  more free-flowing within the abdomen overlying the liver and spleen and extending into the pelvis compatible with stigmata of hemoperitoneum. No active focus of hemorrhage is identified on this unenhanced study. 3. Soft tissue induration of the abdominopelvic wall likely representing edema or concordant with the patient's history of bruising. 4. No retroperitoneal hemorrhage. 5. Intact gastric sleeve staple line without dehiscence identified. No adjacent free air is noted. No abscess collections are seen. No bowel obstruction is noted. These results were called by telephone at the time of interpretation on 02/01/2018 at 10:15 pm to Velva , who verbally acknowledged these results. Electronically Signed   By: Ashley Royalty M.D.   On: 02/01/2018 22:15   Dg Chest Port 1 View  Result Date: 02/02/2018 CLINICAL DATA:  Acute hypoxia EXAM: PORTABLE CHEST 1 VIEW COMPARISON:  02/01/2018 and prior studies FINDINGS: Cardiomegaly and cardiac valve replacement changes again noted in this low volume study. This is a low volume film with unchanged bibasilar atelectasis/opacities. No pneumothorax. There has been little interval change since the prior study. IMPRESSION: Unchanged appearance of  the chest with bibasilar atelectasis/opacities in this low volume study Electronically Signed   By: Margarette Canada M.D.   On: 02/02/2018 11:56   Dg Chest Portable 1 View  Result Date: 02/01/2018 CLINICAL DATA:  Abdominal pain EXAM: PORTABLE CHEST 1 VIEW COMPARISON:  01/25/2018 FINDINGS: The heart is mildly enlarged. Stable vascular congestion. Lungs are under aerated with bibasilar atelectasis. Postoperative changes. No pneumothorax. IMPRESSION: Stable cardiomegaly and vascular congestion. Electronically Signed   By: Marybelle Killings M.D.   On: 02/01/2018 21:46    Telemetry    02/03/18 NSR/ST with 1st degree AV block - Personally Reviewed  ECG    No new tracing as of 02/03/18 - Personally Reviewed  Cardiac Studies   ECHO: 12/03/17: Study Conclusions  - Left ventricle: The cavity size was normal. Wall thickness was increased in a pattern of moderate LVH. Systolic function was vigorous. The estimated ejection fraction was in the range of 65% to 70%. - Aortic valve: AV prosthesis is well seated Peak and mean gradients through the valve atr 47 and 25 mm Hg respectively These gradients are lower than those reported in echo of March 2018 - Mitral valve: MV prosthesis is diffiuclt to see well Peak and mean gradients through the valve are 18 and 8 mm Hg respectively. MVA by P T1/2 is 1.8 cm2 No significant MR. No significant change in gradients from echo in March 2018. Valve area by pressure half-time: 1.8 cm^2. - Pulmonary arteries: PA peak pressure: 43 mm Hg (S).  Patient Profile     57 y.o. 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 Bertell Maria at [facility] 2016),morbid obesity with recent sleeve gastrectomy 01/24/2018,atrial fibrillation/flutter,asthma, ILD who is being seen today for the management of anticoagulation the evaluation of  at the request of Dr. Denton Brick.   Assessment & Plan    1.  History of aortic and  mitral valve replacement 2016 ([employer]), on chronic Coumadin admitted s/p gastric surgery with mesenteric hematoma: -Cardiology consulted secondary to chronic anticoagulation given her aortic and mitral mechanical valves.   -Hemoglobin 6.4 today after 4 units PRBC's since admission>>>was 6.7 on admission down from 9.0 on day of discharge 01/31/2018 -INR, 1.60 today  -Continue to hold all anticoagulation secondary to acute abdominal bleed until further acute bleeding stabilized.  -Worsening status>>>increased respiratory status>>now with need for Bipap  2.  Chronic diastolic  CHF: -Last echocardiogram, 12/03/2016 with normal LV function at 66-year percent with a well seated AV prosthesis as well as MV prosthesis. -See above  3.  Acute renal failure: -Creatinine, 3.65 today>>>nehroology consulted 02/02/18 with recommendations for renal US today   -Baseline appears to be the 1.3-1.4 range remotely -Continue to avoid nephrotoxic medications -Per primary team/nephrology   4.  History of postoperative atrial fibrillation/atrial flutter: -Sinus tachycardia currently likely in the setting of acute illness -Continue to monitor -Currently not an anticoagulation candidate secondary to acute abdominal bleed -Restart anticoagulation once more appropriate  5.  Morbid obesity s/p gastric sleeve surgery today - -s/p recent gastric sleeve, discharged on 01/31/2018 with readmission with possible acute infection and acute abdominal bleed on 02/01/2018 -Per primary and surgical teams  7. Hyperkalemia: -K+, 6.4 today>>>kayexalte per primary team and nephrology  -Follow with daily BMET   Signed, Kathyrn Drown [name] [facility] Pager: [phone] 02/03/2018, 8:27 AM     For questions or updates, please contact   Please consult [email or web address] for contact info under Cardiology/STEMI.

## 2018-02-03 NOTE — Progress Notes (Signed)
Rounded with Dr Grandville Silos.  ICU Nurse at bedside discussed patient bariatric surgery history.  Questions regarding diet/fluids addressed.  Contact information provided to staff. Will continue to monitor.  [facility] Bariatric Nurse Coordinator Direct dial [phone]

## 2018-02-03 NOTE — Progress Notes (Signed)
CRITICAL VALUE ALERT  Critical Value: K+ 6.4   Date & Time Notied:  02/03/18 0330am   Provider Notified: [facility] paged  Orders Received/Actions taken: Waiting for orders, RN will continue to monitor

## 2018-02-03 NOTE — Care Management Note (Signed)
Case Management Note  Patient Details  Name: [name] MRN: [id number] Date of Birth: 21-Nov-1960 DAY 1 Subjective/Objective:                  Temp 102.9 on bipap wcb=37.4 /hgb 8.4/1 unit prbc possible or later today for bleeding  Action/Plan:   Expected Discharge Date:                  Expected Discharge Plan:     In-House Referral:     Discharge planning Services     Post Acute Care Choice:    Choice offered to:     DME Arranged:    DME Agency:     HH Arranged:    [place] Agency:     Status of Service:     If discussed at [employer], dates discussed:    Additional Comments:  Leeroy Cha, RN 02/03/2018, 2:34 PM

## 2018-02-03 NOTE — Progress Notes (Signed)
Because of ongoing bleeding and rising wbc, I plan on taking pt to OR for diagnositic laparoscopy, evacuation of hematoma/washout. Longer note to follow.   [name]. [name], MD, FACS General, Bariatric, & Minimally Invasive Surgery [facility], [place]

## 2018-02-03 NOTE — Consult Note (Signed)
[name]  XVQ:[id number] DOB: 1961-04-02 DOA: 02/01/2018 PCP: [name], FNP    Reason for Consult / Chief Complaint:  Hypercarbic resp failure  Consulting MD:  Dr. 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  She follows with Dr. Lake [name] in the clinic. 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 No evidence of vasculitis.  Underwent aortic, mitral valve replacement in 2016, on anticoag.  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  Transferred to SDU on 12 lt O2. PCCM consulted for help with management.  Respiratory status improved with incentive spirometry Course complicated by hypotension in setting of acute blood loss anemia 8/29, acute on chronic renal failure.  She was discharged on 9/3 on anticoagulation for her mechanical valves  9/4-Readmitted with AKI, hyperkalemia, acute omental, mesenteric hematomas, hemoperitoneum with hemoglobin of 6.5, leukocytosis.  Started on antibiotics, renal consulted. Anticoagulation held and cardiology consulted. 9/6-PCCM consulted for altered mental status after she received Dilaudid.  ABG shows hypercarbia.  Subjective / Interval Events  More awake at the time of my examination Complains of dry mouth.  Wants something to drink.   Objective   Blood pressure (!) 109/56, pulse 99, temperature (!) 100.5 F (38.1 C), temperature source Axillary, resp. rate 19, height 5\' 5"  (1.651 m), weight (!) 154.9 kg, SpO2 93 %.   Intake/Output Summary (Last 24 hours) at 02/03/2018 1008 Last data filed at 02/03/2018 0730 Gross per 24 hour  Intake 823.27 ml  Output 1050 ml  Net -226.73 ml   Filed Weights   02/01/18 2028 02/02/18 0423 02/03/18 0332  Weight: (!) 154.2 kg (!) 153.6 kg (!) 154.9 kg   Examination: Gen:      No acute distress HEENT:  EOMI, sclera anicteric Neck:     No masses; no thyromegaly Lungs:    Clear to auscultation anteriorly, no wheeze or crackles. CV:         Mechanical well sound, no murmurs appreciated. Abd:      + bowel sounds; soft, non-tender; no palpable masses, no distension Ext:    No edema; adequate peripheral perfusion Skin:      Warm and dry; no rash Neuro: Somnolent, Arousable  Consults: date of consult/date signed off & final recs:  Cardiology 9/5  Nephrology 9/5  Procedures:   Significant Diagnostic Tests: Sleep study 03/04/17 - No significant obstructive sleep apnea occurred during this study (AHI = 1.3/h). - No significant central sleep apnea occurred during this study (CAI = 0.0/h). - Mild oxygen desaturation was noted during this study (Min O2 = 87.00%). - The patient snored with soft snoring volume.  Lung biopsy 10/03/2013- hemosiderin laden lung macrophages, mild chronic fibrosis, no vasculitis.  CT the abdomen pelvis 9/4-subsegmental atelectasis in the lungs, omental, mesenteric hematomas, hemoperitoneum, soft tissue induration of the abdominal pelvic wall. Chest x-ray 9/6-cardiomegaly, low lung volumes. I have reviewed the images personally.  Micro Data: Blood cultures 9/5- Urine culture 9/5-  Antimicrobials:  Zosyn 9/5 >>  Objective   Gen:      No acute distress, obese HEENT:  EOMI, sclera anicteric Neck:     No masses; no thyromegaly Lungs:    Clear to auscultation, no wheeze  or crackles CV:         Mechanical valve click, no murmurs appreciated. Abd:   Distended abdomen with abdominal wall hematoma, ecchymosis Ext:    1+ edema; adequate peripheral perfusion Skin:      Warm and dry; no rash Neuro: Somnolent, awakens to voice.  No focal deficits.  Assessment & Plan:  57 year old with history of interstitial lung disease,  asthma, obesity admitted with AKI, hyperkalemia, anemia from postoperative bleed PCCM consulted for hypercarbic respiratory failure, altered mental status  Acute hypercarbic respiratory failure History of ILD, asthma, chronic hypoxic respiratory failure. This occurred acutely after she received Dilaudid.  Do not suspect any exacerbation of interstitial lung disease or asthma.  She is not wheezing. Mental status is improving at the time of my examination Start Bipap, recheck ABG  No need for systemic steroids Continue Brovana, Pulmicort twice daily and duonebs as needed Start incentive spirometer.  History of hypertension, aortic, mitral valve replacement Off anticoagulation due to acute bleed.  Difficult situation as she is at high risk for development of valve thrombosis. Cardiology on board.  AKI, hyperkalemia Continue Kayexalate, follow BMP Nephrology on board.  May end up needing dialysis.  Fever, leukocytosis Continue Zosyn.  Agree with ID consult Consider IR drainage of fluid collection to make sure she is not developing an infection. Discussed with Dr. Megan Salon.  Best Practice / Goals of Care / Disposition.   DVT prophylaxis: SCD GI prophylaxis: Protonix Diet: NPO for now while on bipap Mobility: Bedrest Code Status: Full Family Communication: Pt updated 9/6  Disposition / Summary of Today's Plan 02/03/18   Start BiPAP for hypercarbic respiratory failure Follow ABG Continue bronchodilators, duo nebs as needed  Labs   CBC: Recent Labs  Lab 01/31/18 0502 02/01/18 2056 02/01/18 2102 02/02/18 0357 02/02/18 0925 02/02/18 2121 02/03/18 0323  WBC 8.7 24.2*  --  27.5* 27.8*  --  37.4*  NEUTROABS  --  21.1  --   --  22.8  --  29.7  HGB 9.0* 6.7* 6.5* 6.8* 7.7* 9.2* 8.9*  HCT 29.0* 21.2* 19.0* 20.9* 23.2* 27.8* 27.5*  MCV 102.1* 100.0  --  95.4 93.2  --  93.5  PLT 278 466*  --  392 398  --  245*   Basic Metabolic Panel: Recent Labs  Lab 01/28/18 0217   02/01/18 2056 02/01/18 2102 02/02/18 0357 02/02/18 0925 02/02/18 2121 02/03/18 0323  NA 137   < > 141 137 141 142 142 142  K 4.9   < > 6.6* 6.3* 6.7* 6.7* 6.5* 6.4*  CL 98   < > 108 107 108 106 105 104  CO2 28   < > 20*  --  24 23 25 26   GLUCOSE 111*   < > 258* 243* 177* 164* 166* 172*  BUN 80*   < > 47* 44* 55* 58* 65* 73*  CREATININE 3.33*   < > 2.53* 2.60* 2.99* 3.02* 3.34* 3.65*  CALCIUM 9.3   < > 9.4  --  9.0 9.3 9.4 9.2  MG 2.4  --   --   --   --   --   --   --   PHOS 5.4*  --   --   --   --  6.5*  --   --    < > = values in this interval not displayed.   GFR: Estimated Creatinine Clearance: 25.5 mL/min (A) (by C-G formula based on SCr of 3.65 mg/dL (H)). Recent Labs  Lab 02/01/18 2056 02/02/18 0357 02/02/18 0925 02/03/18 0323  WBC 24.2* 27.5* 27.8* 37.4*   Liver Function Tests: Recent Labs  Lab 02/01/18 2056 02/02/18 0925 02/03/18 0323  AST 36  --  44*  ALT 26  --  45*  ALKPHOS 59  --  65  BILITOT 1.2  --  1.4*  PROT 6.6  --  7.3  ALBUMIN 3.2* 3.2* 3.5   Recent Labs  Lab 02/01/18 2056  LIPASE 57*   No results for input(s): AMMONIA in the last 168 hours. ABG    Component Value Date/Time   PHART 7.236 (L) 02/03/2018 0800   PCO2ART 64.2 (H) 02/03/2018 0800   PO2ART 68.5 (L) 02/03/2018 0800   HCO3 26.3 02/03/2018 0800   TCO2 19 (L) 02/01/2018 2102   ACIDBASEDEF 1.6 02/03/2018 0800   O2SAT 90.6 02/03/2018 0800    Coagulation Profile: Recent Labs  Lab 01/30/18 0502 01/31/18 0502 02/01/18 2121 02/02/18 0357 02/03/18 0323  INR 1.26 1.42 1.95 2.08 1.60   Cardiac Enzymes: No results for input(s): CKTOTAL, CKMB, CKMBINDEX, TROPONINI in the last 168 hours. HbA1C: No results found for: HGBA1C CBG: Recent Labs  Lab 02/02/18 1520 02/02/18 1958 02/02/18 2325 02/03/18 0320 02/03/18 0733  GLUCAP 153* 149* 174* 151* 153*     Review of Systems:   All negative; except for those that are bolded, which indicate positives.  Constitutional: weight  loss, weight gain, night sweats, fevers, chills, fatigue, weakness.  HEENT: headaches, sore throat, sneezing, nasal congestion, post nasal drip, difficulty swallowing, tooth/dental problems, visual complaints, visual changes, ear aches. Neuro: difficulty with speech, weakness, numbness, ataxia. CV:  chest pain, orthopnea, PND, swelling in lower extremities, dizziness, palpitations, syncope.  Resp: cough, hemoptysis, dyspnea, wheezing. GI: heartburn, indigestion, abdominal pain, nausea, vomiting, diarrhea, constipation, change in bowel habits, loss of appetite, hematemesis, melena, hematochezia.  GU: dysuria, change in color of urine, urgency or frequency, flank pain, hematuria. MSK: joint pain or swelling, decreased range of motion. Psych: change in mood or affect, depression, anxiety, suicidal ideations, homicidal ideations. Skin: rash, itching, bruising.  The patient is critically ill with multiple organ system failure and requires high complexity decision making for assessment and support, frequent evaluation and titration of therapies, advanced monitoring, review of radiographic studies and interpretation of complex data.   Critical Care Time devoted to patient care services, exclusive of separately billable procedures, described in this note is 35 minutes.   Marshell Garfinkel MD [facility] Pager [phone] If no answer or after 3pm call: [phone] 02/03/2018, 10:41 AM

## 2018-02-03 NOTE — Anesthesia Procedure Notes (Signed)
Procedure Name: Intubation Date/Time: 02/03/2018 3:37 PM Performed by: Glory Buff, CRNA Pre-anesthesia Checklist: Patient identified, Emergency Drugs available, Suction available and Patient being monitored Patient Re-evaluated:Patient Re-evaluated prior to induction Oxygen Delivery Method: Circle system utilized Preoxygenation: Pre-oxygenation with 100% oxygen Induction Type: IV induction Ventilation: Mask ventilation without difficulty Laryngoscope Size: Miller and 3 Grade View: Grade I Tube type: Subglottic suction tube Tube size: 7.5 mm Number of attempts: 1 Airway Equipment and Method: Stylet and Oral airway Placement Confirmation: ETT inserted through vocal cords under direct vision,  positive ETCO2 and breath sounds checked- equal and bilateral Secured at: 21 cm Tube secured with: Tape Dental Injury: Teeth and Oropharynx as per pre-operative assessment

## 2018-02-03 NOTE — Progress Notes (Signed)
[place] Progress Note Patient Name: [name] DOB: [birth year] MRN: [id number]   Date of Service  02/03/2018  HPI/Events of Note    eICU Interventions  Reordered Propofol infusion for sedation on the ventilator        Frederik Pear 02/03/2018, 8:39 PM

## 2018-02-03 NOTE — Progress Notes (Addendum)
[facility] Progress Note  Subjective: confused / somnolent this am, ABG and CXR pending.  Creat up 3.6, K 6.5 again today. 1.0 L UOP yesterday.  Got 4u prbc's yesterday. Hb up 8-9 range, BP's better 125/ 78  Vitals:   02/03/18 0600 02/03/18 0655 02/03/18 0700 02/03/18 0746  BP: (!) 146/67  129/66   Pulse: 100 (!) 101 (!) 101   Resp: 18 17 15    Temp:  (!) 102.9 F (39.4 C)  (!) 100.5 F (38.1 C)  TempSrc:  Axillary  Axillary  SpO2: 92% (!) 89% (!) 87%   Weight:      Height:        Inpatient medications: . acetaminophen  325 mg Rectal Once  . chlorhexidine  15 mL Mouth Rinse BID  . gabapentin  200 mg Oral BID  . insulin aspart  0-9 Units Subcutaneous Q4H  . mouth rinse  15 mL Mouth Rinse q12n4p  . metoprolol tartrate  2.5 mg Intravenous Q6H  . mometasone-formoterol  2 puff Inhalation BID  . pantoprazole (PROTONIX) IV  40 mg Intravenous Daily  . protein supplement shake  6 oz Oral QID  . sodium polystyrene  60 g Rectal Once   . acetaminophen    . methocarbamol (ROBAXIN) IV    . ondansetron (ZOFRAN) IV    . piperacillin-tazobactam (ZOSYN)  IV Stopped (02/03/18 0345)   alum & mag hydroxide-simeth, hydrocortisone, hydrocortisone cream, magic mouthwash, methocarbamol (ROBAXIN) IV, metoprolol tartrate, ondansetron (ZOFRAN) IV **OR** ondansetron (ZOFRAN) IV, ondansetron **OR** [DISCONTINUED] ondansetron (ZOFRAN) IV, phenol, traMADol  Iron/TIBC/Ferritin/ %Sat No results found for: IRON, TIBC, FERRITIN, IRONPCTSAT   Home meds:  - metoprolol tartrate 25 bid  - bupropion 300 qd/ citalopram 20 qd/ tramadol 50 qid prn  - warfarin as directed  - symbicort 80-4.5 bid/ albuterol prn  - allopurinol 300 hx/ enoxaparin 150 bid/ montelukast 10 qd/ pantoprazole 40   Exam: Gen lethargic, not in distress, some ^wob compared to yest +JVD to angle of jaw Chest faint rales R base, o/w clear bilat RRR no MRG Abd soft ntnd no mass or ascites +bs diffuse bruising over mid and  lower abdomen GU w foley draining clear yellow urine Ext chronic venous stasis bilat skin changes/ thickening w/ mild edema R> L Neuro as above, not following commands today   9/5 UA > negative, UNa 79   Urine K 42  Urine creat 132   Impression: 1. Acute on CRF - suspect due to hypotension , possible hypovolemia. BP's up after prbc's x 4 and some IVF boluses, however creat still rising. Now febrile/ rising WBC, started on abx. Making good amount of urine. Get renal US.  Cont to follow. Not getting IVF's now.  May need lasix if CXR wet. 2. Fever/ ^WBC - unclear cause, CT abd not revealing.  On empiric IV abx, BP's ok.   3. AMS - w/u in progress, ABG pending.  4. Resp failure - w/u in progress 5. Hyperkalemia - got kayexalate yesterday (60gm po and 45gm per rectum), K+ stable but still high.  No new EKG changes.  If not improved soon may need dialysis/ CRRT.   6. Anemia -due to hemoperitoneum, other. Per primary, sp 4u prbc yest.  7. CKD III - baseline creat 1.2- 1.5 8. Morbid obesity / recent lap gastric sleeve - done 01/24/18 9. Prosthetic AVR and MVR - on coumadin at home for these 10. ILD/ COPD/ OSA - on home O2   Plan - as above  Kelly Splinter MD [facility] pager [phone]   02/03/2018, 8:11 AM   Recent Labs  Lab 01/28/18 0217  02/02/18 0357 02/02/18 0925 02/02/18 2121 02/03/18 0323  NA 137   < > 141 142 142 142  K 4.9   < > 6.7* 6.7* 6.5* 6.4*  CL 98   < > 108 106 105 104  CO2 28   < > 24 23 25 26   GLUCOSE 111*   < > 177* 164* 166* 172*  BUN 80*   < > 55* 58* 65* 73*  CREATININE 3.33*   < > 2.99* 3.02* 3.34* 3.65*  CALCIUM 9.3   < > 9.0 9.3 9.4 9.2  PHOS 5.4*  --   --  6.5*  --   --   ALBUMIN  --    < >  --  3.2*  --  3.5  INR 1.37   < > 2.08  --   --  1.60   < > = values in this interval not displayed.   Recent Labs  Lab 02/01/18 2056 02/03/18 0323  AST 36 44*  ALT 26 45*  ALKPHOS 59 65  BILITOT 1.2 1.4*  PROT 6.6 7.3   Recent Labs   Lab 02/02/18 0925 02/02/18 2121 02/03/18 0323  WBC 27.8*  --  37.4*  NEUTROABS 22.8  --  29.7  HGB 7.7* 9.2* 8.9*  HCT 23.2* 27.8* 27.5*  MCV 93.2  --  93.5  PLT 398  --  408*

## 2018-02-03 NOTE — Anesthesia Preprocedure Evaluation (Signed)
Anesthesia Evaluation  Patient identified by MRN, date of birth, ID band Patient awake    Reviewed: Allergy & Precautions, H&P , NPO status , Patient's Chart, lab work & pertinent test results  Airway Mallampati: III   Neck ROM: full    Dental   Pulmonary asthma , sleep apnea , COPD, former smoker,    breath sounds clear to auscultation       Cardiovascular hypertension, +CHF  + Valvular Problems/Murmurs  Rhythm:regular Rate:Normal  S/p AVR and MVR   Neuro/Psych PSYCHIATRIC DISORDERS Anxiety Depression  Neuromuscular disease    GI/Hepatic hiatal hernia,   Endo/Other  Morbid obesity  Renal/GU Renal InsufficiencyRenal disease     Musculoskeletal  (+) Arthritis ,   Abdominal   Peds  Hematology  (+) anemia ,   Anesthesia Other Findings   Reproductive/Obstetrics                             Anesthesia Physical Anesthesia Plan  ASA: IV  Anesthesia Plan: General   Post-op Pain Management:    Induction: Intravenous  PONV Risk Score and Plan: 3 and Ondansetron, Dexamethasone and Treatment may vary due to age or medical condition  Airway Management Planned: Oral ETT  Additional Equipment: Arterial line  Intra-op Plan:   Post-operative Plan: Possible Post-op intubation/ventilation  Informed Consent: I have reviewed the patients History and Physical, chart, labs and discussed the procedure including the risks, benefits and alternatives for the proposed anesthesia with the patient or authorized representative who has indicated his/her understanding and acceptance.     Plan Discussed with: CRNA, Anesthesiologist and Surgeon  Anesthesia Plan Comments:         Anesthesia Quick Evaluation

## 2018-02-03 NOTE — Progress Notes (Addendum)
Patient ID: [name], female   DOB: 09-30-1960, 57 y.o.   MRN: [id number]   Progress Note: Metabolic and Bariatric Surgery Service   Chief Complaint/Subjective: No more blood overnight. Spiked Temp early this am Was given some dilaudid short time ago and reqd 1 dose narcan afterwards Pt a little somnolent this am. Not too talkative but responds appropriately   Objective: Vital signs in last 24 hours: Temp:  [98 F (36.7 C)-102.9 F (39.4 C)] 100.5 F (38.1 C) (09/06 0746) Pulse Rate:  [93-114] 101 (09/06 0700) Resp:  [14-26] 15 (09/06 0700) BP: (88-152)/(35-97) 129/66 (09/06 0700) SpO2:  [87 %-98 %] 87 % (09/06 0700) Weight:  [154.9 kg] 154.9 kg (09/06 0332) Last BM Date: 02/01/18  Intake/Output from previous day: 09/05 0701 - 09/06 0700 In: 1093.9 [Blood:973.4; IV Piggyback:120.5] Out: 1050 [Urine:1050] Intake/Output this shift: Total I/O In: 49.9 [IV Piggyback:49.9] Out: -   Lungs: symm rise, hard to auscultate  Cardiovascular: reg, +click  Abd: unchanged. Obese, bruising, incisions ok. Mild TTP  Extremities: chronic RLE lymphedema, not really edema LLE  Neuro: a little somnolent, but answers questions appropriately  Lab Results: CBC  Recent Labs    02/02/18 0925 02/02/18 2121 02/03/18 0323  WBC 27.8*  --  37.4*  HGB 7.7* 9.2* 8.9*  HCT 23.2* 27.8* 27.5*  PLT 398  --  408*   BMET Recent Labs    02/02/18 2121 02/03/18 0323  NA 142 142  K 6.5* 6.4*  CL 105 104  CO2 25 26  GLUCOSE 166* 172*  BUN 65* 73*  CREATININE 3.34* 3.65*  CALCIUM 9.4 9.2   PT/INR Recent Labs    02/02/18 0357 02/03/18 0323  LABPROT 23.3* 18.9*  INR 2.08 1.60   ABG No results for input(s): PHART, HCO3 in the last 72 hours.  Invalid input(s): PCO2, PO2  Studies/Results:  Anti-infectives: Anti-infectives (From admission, onward)   Start     Dose/Rate Route Frequency Ordered Stop   02/02/18 0800  piperacillin-tazobactam (ZOSYN) IVPB 3.375 g     3.375  g 12.5 mL/hr over 240 Minutes Intravenous Every 8 hours 02/02/18 0300     02/01/18 2245  piperacillin-tazobactam (ZOSYN) IVPB 3.375 g     3.375 g 100 mL/hr over 30 Minutes Intravenous NOW 02/01/18 2234 02/02/18 0203      Medications: Scheduled Meds: . acetaminophen  325 mg Rectal Once  . chlorhexidine  15 mL Mouth Rinse BID  . gabapentin  200 mg Oral BID  . insulin aspart  0-9 Units Subcutaneous Q4H  . mouth rinse  15 mL Mouth Rinse q12n4p  . metoprolol tartrate  2.5 mg Intravenous Q6H  . mometasone-formoterol  2 puff Inhalation BID  . pantoprazole (PROTONIX) IV  40 mg Intravenous Daily  . protein supplement shake  6 oz Oral QID  . sodium polystyrene  60 g Rectal Once   Continuous Infusions: . methocarbamol (ROBAXIN) IV    . ondansetron (ZOFRAN) IV    . piperacillin-tazobactam (ZOSYN)  IV Stopped (02/03/18 0345)   PRN Meds:.alum & mag hydroxide-simeth, hydrocortisone, hydrocortisone cream, magic mouthwash, methocarbamol (ROBAXIN) IV, metoprolol tartrate, ondansetron (ZOFRAN) IV **OR** ondansetron (ZOFRAN) IV, ondansetron **OR** [DISCONTINUED] ondansetron (ZOFRAN) IV, phenol  Assessment/Plan: Patient Active Problem List   Diagnosis Date Noted  . Abdominal pain 02/02/2018  . Hyperkalemia 02/02/2018  . Acute kidney injury superimposed on CKD ([place]) 02/02/2018  . Mesenteric hematoma 02/02/2018  . Hemoperitoneum 02/02/2018  . Anemia   . Intra-abdominal hematoma   . History of  sleeve gastrectomy 01/24/2018 02/01/2018  . Postoperative anemia due to chronic blood loss on full anticoagulation 02/01/2018  . AKI (acute kidney injury) ([place]) 02/01/2018  . Acute renal failure superimposed on stage 3 chronic kidney disease ([place]) 01/26/2018  . PAF (paroxysmal atrial fibrillation) ([place])   . Acute respiratory failure ([place])   . Hypoxia   . Postprocedural hypotension   . Chronic acquired lymphedema - Right lower extremity 01/24/2018  . Hx of mechanical aortic valve replacement 01/24/2018  .  Obstructive sleep apnea 02/03/2017  . Chronic diastolic heart failure ([place]) 01/11/2017  . Cellulitis 11/30/2016  . Morbid obesity with BMI of 50.0-59.9, adult ([place]) 02/11/2016  . HX: long term anticoagulant use 02/11/2016  . Chronic respiratory failure ([place]) 08/12/2015  . 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 Dyspnea 07/22/2015  . Allergic rhinitis 05/02/2015  . Exertional dyspnea   . Symptomatic anemia 04/08/2015  . History of mitral valve replacement with mechanical valve 04/08/2015  . CAP (community acquired pneumonia) 04/08/2015  . CKD (chronic kidney disease) stage 3, GFR 30-59 ml/min ([facility]) 04/08/2015  . Atrial flutter, unspecified   . Chronic anticoagulation 09/04/2014  . Warfarin anticoagulation 08/14/2014  . Presence of prosthetic heart valve 08/07/2014  . ILD (interstitial lung disease) ([place]) 10/12/2013  . Aortic stenosis 10/02/2013  . Hyperlipidemia 10/02/2013  . Chronic asthmatic bronchitis ([place]) 08/06/2013  . Abnormal CT scan, chest 08/09/2012  . Gout 04/16/2011  . Essential hypertension 10/07/2010  . Depressive disorder, not elsewhere classified 10/07/2010   ABL anemia - appears to have responded appropriately.  Repeat labs in am and/or if significant change in vitals. Maintain pIV. 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 No free air. No fluid collection along staple line. Will allow bari full liquids and protein shakes AKI on CKD - good uop. Follow Cr.  Hyperkalemia- per renal/triad Mechanical valves  - hold anticoagulation. If hgb remains stable, I think we could restart heparin gtt WITHOUT bolus on Saturday PM.  Pain control - added scheduled tylenol/gabapentin. Triad dc'd IV pain and oxycodone which is ok.  I believe her discomfort is mainly coming from abd wall.  ILD - on chronic home O2 ID -  Suspicion for Cdiff very low - no more loose stools. Wbc up, now Temp.  Cont zosyn for now. Agree with  CXR to look at lungs. F/u cultures.  FEN - HLIV, hyperkalemia per triad; bari fulls.   Disposition: OOB today. Pt is very narcotic sensitive. Avoid IV opioids - if have to give - give smallest dose available. Maximize non-opioid analgesic.   LOS: 1 day    Greer Pickerel, MD [phone] [facility]

## 2018-02-03 NOTE — Transfer of Care (Signed)
Immediate Anesthesia Transfer of Care Note  Patient: [name]  Procedure(s) Performed: DIAGNOSTIC LAPAROSCOPY EVACUATION OF HEMATOMA (N/A Abdomen)  Patient Location: icu  Anesthesia Type:General  Level of Consciousness: Patient remains intubated per anesthesia plan  Airway & Oxygen Therapy: Patient placed on Ventilator (see vital sign flow sheet for setting)  Post-op Assessment: Report given to RN and Post -op Vital signs reviewed and stable  Post vital signs: Reviewed and stable  Last Vitals:  Vitals Value Taken Time  BP    Temp    Pulse 88 02/03/2018  6:38 PM  Resp 21 02/03/2018  6:38 PM  SpO2      Last Pain:  Vitals:   02/03/18 1200  TempSrc: Oral  PainSc:          Complications: No apparent anesthesia complications

## 2018-02-03 NOTE — Progress Notes (Signed)
CRITICAL VALUE ALERT  Critical Value:  K+ 6.5  Date & Time Notied:  02/02/2018 2130  Provider Notified: MD notified   Orders Received/Actions taken: Orders given per MD and administered; RN will continue to monitor

## 2018-02-03 NOTE — Anesthesia Procedure Notes (Signed)
Arterial Line Insertion Start/End9/10/2017 3:43 PM, 02/03/2018 3:45 PM Performed by: Albertha Ghee, MD, anesthesiologist  Patient location: OR. Preanesthetic checklist: patient identified, IV checked, site marked, risks and benefits discussed, surgical consent, monitors and equipment checked, pre-op evaluation, timeout performed and anesthesia consent Right, radial was placed Catheter size: 20 G Hand hygiene performed   Attempts: 1 Procedure performed without using ultrasound guided technique. Following insertion, dressing applied and Biopatch. Post procedure assessment: normal  Patient tolerated the procedure well with no immediate complications.

## 2018-02-03 NOTE — Progress Notes (Addendum)
PROGRESS NOTE    DIAVIAN Young  JQB:[id number] DOB: 24-Jun-1960 DOA: 02/01/2018 PCP: [name], FNP    Brief Narrative:   [name] is a 57 y.o. 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 Fib/flutter, asthma, ILD.  Patient was recently admitted and discharged from surgical service- 8/27-01/31/18, for a planned laparoscopic sleeve gastrectomy.  Patient was bridged with Lovenox.  Hospital stay was complicated by hypotension and acute anemia, requiring transfusion of 2 units PRBCs and multiple IV fluid boluses, acute kidney injury with reduced urine output requiring nephrology consultation.  When patient's hemoglobin was stable patient was started on heparin GTT without bolus, at the time of discharge, he was sent home with Lovenox and Coumadin as her INR was still subtherapeutic.  She reports since discharge abdomen has increased in size with increasing abdominal pain.  Patient denies vomiting but reports 4-5 episodes of watery stools over the past 24 hours.  Denies fever or chills, no chest pain shortness of breath or cough, denies dysuria, no headache or neck stiffness.   ED Course: Blood pressure systolic greater than 409, initial tachypnea 33 down to 20, O2 sats greater than 93% on nasal cannula.  Creatinine elevated 2.5, compared to 1.1 on discharge, potassium elevated 6.6.  WBC elevated 24.  Hgb low 6.7 from discharge hemoglobin 9.  Stable chest x-ray -cardiomegaly vascular congestion. CT abdomen and pelvis Wo contrast-fluid hematocrit levels identified  Within contained omental or mesenteric hematomas in the left hemiabdomen the largest measuring approximately 8.3 x 7.8 x 9.1, complex non-simple fluid also seen more free-flowing within the abdomen overlying the liver and spleen and extending into the pelvis compatible with stigmata of  hemoperitoneum, no active focus of hemorrhage identified.  1 L bolus normal saline given in ED. PAtient seen by general surgeon on call - Dr. Johney Maine was consulted recommended hospitalist admission with concomitant medical problems- acute kidney injury, hyperkalemia,  Start Zosyn for possible infection considering  significant leukocytosis   Assessment & Plan:   Principal Problem:   Postoperative anemia due to chronic blood loss on full anticoagulation Active Problems:   Hyperkalemia   Acute on chronic respiratory failure with hypoxia and hypercapnia (HCC)   Acute renal failure superimposed on stage 3 chronic kidney disease (HCC)   Mesenteric hematoma   Hemoperitoneum   Acute metabolic encephalopathy   Abnormal CT scan, chest   Chronic asthmatic bronchitis (HCC)   ILD (interstitial lung disease) (HCC)   History of mitral valve replacement with mechanical valve   CKD (chronic kidney disease) stage 3, GFR 30-59 ml/min (HCC)   Chronic respiratory failure (HCC)   Obstructive sleep apnea   Morbid obesity with BMI of 50.0-59.9, adult ([place])   Chronic acquired lymphedema - Right lower extremity   Hx of mechanical aortic valve replacement   Gout   HX: long term anticoagulant use   Warfarin anticoagulation   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  INR is 1.60 this morning.  Patient with history of aortic and mechanical valve.  Patient with significant abdominal pain.  Hemoglobin  noted to be as low as 6.5 on admission.  Patient status post 4 units packed red blood cells during this admission and hemoglobin at 8.9 this morning from 9.2 after fourth unit of packed red blood cells.  Concerned that patient may have ongoing bleeding.  Repeat H&H this afternoon with transfusion threshold hemoglobin less than 8.  Anticoagulation on hold for now.  Follow H&H.  2.  Hyperkalemia Likely secondary to acute on chronic kidney disease in the setting of hematomas/hemoperitoneum.  Patient noted to have peaked T waves on EKG on admission with improvement on repeat EKG this morning.  Potassium was as high as 6.6 on admission and currently at 6.4 despite multiple doses of Kayexalate.  Patient also has received IV calcium gluconate and NovoLog.  We will give another dose of Kayexalate enema this morning.  If no improvement with hyperkalemia may need hemodialysis per nephrology recommendations.  Nephrology following and I appreciate the input and recommendations.  3.  Acute on chronic kidney disease stage III Baseline creatinine 1.2-1.5.  Creatinine trending up and currently at 3.65 this morning.  Patient still hyperkalemic with a potassium up to 6.4.  Differential includes prerenal azotemia in the setting of anemia and borderline blood pressure on admission versus ATN.  Urine output of 105 0 cc over the past 24 hours.  Patient however also received 2 doses of Lasix 20 mg IV in between blood transfusions.  Foley catheter in place.  Urinalysis done on admission was protein negative with a specific gravity of 1.024.  Urine sodium ordered this morning at 47.  Urine creatinine 132.42.  Chest x-ray concerning for pulmonary edema however patient with a history of interstitial lung disease.  Repeat chest x-ray pending.  Patient status post 4 units packed red blood cells since this admission.  Renal ultrasound ordered per nephrology.  Nephrology consulted and are following and feel if worsening renal function with  no improvement with hyperkalemia patient may need dialysis/CRRT.   4.  Acute on chronic respiratory failure/hypoxia Patient with history of interstitial lung disease.  Currently on 6 L nasal cannula with sats of 87-92%.  Patient with use of accessory muscles of respiration.  Chest x-ray on admission concerning for vascular congestion.  Patient given some Dilaudid earlier this morning with decreased mental status and had to receive some Narcan which may be playing a role in her acute on chronic respiratory failure.  Patient status post total of 4 units packed red blood cells during this hospitalization.  Patient received 2 doses of Lasix 20 mg IV in between blood transfusions on 02/02/2018 with a urine output of 1.050 L over the past 24 hours.  ABG obtained this morning due to worsening respiratory status with a pH of 7.236, PCO2 of 64, PO2 of 68.5.  Will place patient on a BiPAP, IV Solu-Medrol 80 mg every 6 hours, Pulmicort, Brovana, Claritin, Flonase, PPI, scheduled Xopenex and Atrovent nebs.  Consult with PCCM for further evaluation and management.   5.  Fever/leukocytosis Questionable etiology.  May be reactive secondary to hemoperitoneum and mesenteric hematomas.  CT abdomen and pelvis which was done 02/01/2018 did show large hematoma measuring 8.3 x 7.5 x 8.3 cm and another one 8.3 x 7.8 x 9.1 cm.??  Infected hematoma.  Patient with rising temperature as high as 102.9 at 655 this morning.  Patient also with a worsening  leukocytosis with white count now up to 37.4.  Patient has been pancultured with blood cultures pending, urine cultures pending.  Patient on IV Zosyn which we will continue for now.  Consult with ID for further evaluation and management.  6.  Diarrhea Patient on admission did state that she had episodes of diarrhea.  Patient with no further diarrhea during this hospitalization.  C. difficile PCR pending.  Patient with no further diarrhea and as such C. difficile PCR will be canceled and  enteric precautions discontinued.  Patient receiving Kayexalate due to hyperkalemia.    7.  Status post aortic valve replacement and mitral valve replacement Patient status post history of aortic valve replacement mitral valve replacement in 2016 on chronic anticoagulation.  During recent hospitalization patient was bridged with Lovenox during surgery and sent home with a Lovenox bridge with Coumadin as INR was not therapeutic.  INR today 1.60.  Last dose of Lovenox was the morning of 02/01/2018.  Will continue to hold anticoagulation for now secondary to anemia, hemoperitoneum, mesenteric hematomas.  Due to patient's complex cardiac history cardiology was consulted and are following the patient and recommended due to drop in hemoglobin even after aggressive transfusions to continue to hold anticoagulation until okay with general surgery.  Cardiology following and appreciate input and recommendations.   8.  Obstructive sleep apnea Patient currently on BiPAP.  Once patient is off BiPAP will need to place on CPAP nightly.   9.  Chronic right lower extremity lymphedema Stable.  10.  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   11.  Morbid obesity Status post sleeve gastrectomy 01/24/2018.  12.  Acute encephalopathy Likely drug-induced.  Patient noted to be minimally responsive after receiving IV Dilaudid earlier this morning.  Patient given some IV Narcan and improving clinically slowly this morning.  ABG done with a pH of 7.2, PCO2's in the 60s.  Patient started on BiPAP.  Opioid narcotics have been discontinued.  Follow.    DVT prophylaxis: SCDs Code Status: Full Family Communication: Updated patient.  No family at bedside. Disposition Plan: Transfer to ICU status.    Consultants:   Cardiology: Dr. Radford Pax 02/02/2018  Nephrology: Dr. Jonnie Finner 02/02/2018  General surgery: Dr. Johney Maine 02/01/2018/DrRedmond Pulling following  PCCM pending  ID pending  Procedures:    CT abdomen and pelvis 02/01/2018  Chest x-ray 02/01/2018, 02/03/2018  2 units packed red blood cells 02/01/2018, 02/02/2018  2 units packed red blood cells 02/02/2018  Antimicrobials:   IV Zosyn 02/01/2018   Subjective: Patient noted this morning to be unresponsive after receiving Dilaudid.  Patient given Narcan with clinical improvement.  Patient drowsy however opens eyes to verbal stimuli alert and oriented to's place self and time.  Patient following simple commands.  Patient with some use of accessory muscles of respiration.  Patient with some shortness of breath.  Patient states ice on abdomen has helped some with abdominal pain.  No nausea or emesis.  No chest pain.   Objective: Vitals:   02/03/18 0700 02/03/18 0746 02/03/18 0800 02/03/18 0900  BP: 129/66  (!) 121/47 (!) 109/56  Pulse: (!) 101  100 99  Resp: 15  16 19   Temp:  (!) 100.5 F (38.1 C)    TempSrc:  Axillary    SpO2: (!) 87%  92% 93%  Weight:      Height:        Intake/Output Summary (Last 24 hours) at 02/03/2018 1015 Last data filed at 02/03/2018 0730 Gross per 24  hour  Intake 823.27 ml  Output 1050 ml  Net -226.73 ml   Filed Weights   02/01/18 2028 02/02/18 0423 02/03/18 0332  Weight: (!) 154.2 kg (!) 153.6 kg (!) 154.9 kg    Examination:  General exam: Opens eyes to verbal stimuli.  Pallor. Respiratory system: Clear to auscultation bilaterally anterior lung fields.  Decreased breath sounds in the bases.  Some use of accessory muscles of respiration.  Cardiovascular system: Tachycardia.  Chronic right lower extremity lymphedema.  Thick neck.  Trace to 1 + left lower extremity edema.  Gastrointestinal system: Abdomen is less diffusely tender to palpation, diffuse ecchymosis, positive bowel sounds, no rebound, no guarding.  Wound site c/d/i with Steri-Strips noted. Central nervous system: Drowsy however opens eyes to verbal stimuli and following simple commands.  Moving extremities spontaneously.  Extremities:  Symmetric 5 x 5 power. Skin: No rashes, lesions or ulcers Psychiatry: Judgement and insight appear fair. Mood & affect appropriate.     Data Reviewed: I have personally reviewed following labs and imaging studies  CBC: Recent Labs  Lab 01/31/18 0502 02/01/18 2056 02/01/18 2102 02/02/18 0357 02/02/18 0925 02/02/18 2121 02/03/18 0323  WBC 8.7 24.2*  --  27.5* 27.8*  --  37.4*  NEUTROABS  --  21.1  --   --  22.8  --  29.7  HGB 9.0* 6.7* 6.5* 6.8* 7.7* 9.2* 8.9*  HCT 29.0* 21.2* 19.0* 20.9* 23.2* 27.8* 27.5*  MCV 102.1* 100.0  --  95.4 93.2  --  93.5  PLT 278 466*  --  392 398  --  258*   Basic Metabolic Panel: Recent Labs  Lab 01/28/18 0217  02/01/18 2056 02/01/18 2102 02/02/18 0357 02/02/18 0925 02/02/18 2121 02/03/18 0323  NA 137   < > 141 137 141 142 142 142  K 4.9   < > 6.6* 6.3* 6.7* 6.7* 6.5* 6.4*  CL 98   < > 108 107 108 106 105 104  CO2 28   < > 20*  --  24 23 25 26   GLUCOSE 111*   < > 258* 243* 177* 164* 166* 172*  BUN 80*   < > 47* 44* 55* 58* 65* 73*  CREATININE 3.33*   < > 2.53* 2.60* 2.99* 3.02* 3.34* 3.65*  CALCIUM 9.3   < > 9.4  --  9.0 9.3 9.4 9.2  MG 2.4  --   --   --   --   --   --   --   PHOS 5.4*  --   --   --   --  6.5*  --   --    < > = values in this interval not displayed.   GFR: Estimated Creatinine Clearance: 25.5 mL/min (A) (by C-G formula based on SCr of 3.65 mg/dL (H)). Liver Function Tests: Recent Labs  Lab 02/01/18 2056 02/02/18 0925 02/03/18 0323  AST 36  --  44*  ALT 26  --  45*  ALKPHOS 59  --  65  BILITOT 1.2  --  1.4*  PROT 6.6  --  7.3  ALBUMIN 3.2* 3.2* 3.5   Recent Labs  Lab 02/01/18 2056  LIPASE 57*   No results for input(s): AMMONIA in the last 168 hours. Coagulation Profile: Recent Labs  Lab 01/30/18 0502 01/31/18 0502 02/01/18 2121 02/02/18 0357 02/03/18 0323  INR 1.26 1.42 1.95 2.08 1.60   Cardiac Enzymes: No results for input(s): CKTOTAL, CKMB, CKMBINDEX, TROPONINI in the last 168 hours. BNP  (last  3 results) No results for input(s): PROBNP in the last 8760 hours. HbA1C: No results for input(s): HGBA1C in the last 72 hours. CBG: Recent Labs  Lab 02/02/18 1520 02/02/18 1958 02/02/18 2325 02/03/18 0320 02/03/18 0733  GLUCAP 153* 149* 174* 151* 153*   Lipid Profile: No results for input(s): CHOL, HDL, LDLCALC, TRIG, CHOLHDL, LDLDIRECT in the last 72 hours. Thyroid Function Tests: No results for input(s): TSH, T4TOTAL, FREET4, T3FREE, THYROIDAB in the last 72 hours. Anemia Panel: No results for input(s): VITAMINB12, FOLATE, FERRITIN, TIBC, IRON, RETICCTPCT in the last 72 hours. Sepsis Labs: No results for input(s): PROCALCITON, LATICACIDVEN in the last 168 hours.  Recent Results (from the past 240 hour(s))  MRSA PCR Screening     Status: None   Collection Time: 01/26/18  1:51 PM  Result Value Ref Range Status   MRSA by PCR NEGATIVE NEGATIVE Final    Comment:        The GeneXpert MRSA Assay (FDA approved for NASAL specimens only), is one component of a comprehensive MRSA colonization surveillance program. It is not intended to diagnose MRSA infection nor to guide or monitor treatment for MRSA infections. Performed at [facility], [place] [address] [place], [place] [address]          Radiology Studies: Ct Abdomen Pelvis Wo Contrast  Result Date: 02/01/2018 CLINICAL DATA:  57 year old female status post gastric sleeve procedure last Tuesday presents with abdominal pain, distention and diarrhea. Bruising along the abdominal. Patient is pale and there is concern for retroperitoneal hemorrhage. EXAM: CT ABDOMEN AND PELVIS WITHOUT CONTRAST TECHNIQUE: Multidetector CT imaging of the abdomen and pelvis was performed following the standard protocol without IV contrast. COMPARISON:  None. FINDINGS: Assessment of solid organ injury/laceration is limited due to lack of IV contrast. Lower chest: Heart size is enlarged without pericardial effusion. Evidence  of prior median sternotomy and mitral valvular repair of the included heart. Subsegmental atelectasis is seen at the lung bases and/or scarring. No effusion or pneumothorax. Hepatobiliary: The unenhanced liver is unremarkable. No biliary dilatation. Nondistended gallbladder free of stones. Hyperdense appearance of the bile within the gallbladder may be secondary to hemolysis. No subcapsular fluid about the liver. Pancreas: Normal Spleen: Normal.  No subcapsular fluid. Adrenals/Urinary Tract: Normal bilateral adrenal glands. Small rounded cortical hyperdensities may represent tiny proteinaceous or hemorrhagic cysts. No nephrolithiasis nor obstructive uropathy. The urinary bladder is unremarkable. Stomach/Bowel: Staple line across the stomach from gastric sleeve procedure. No adjacent pneumoperitoneum or evidence of staple line dehiscence. Normal small bowel rotation. No bowel obstruction. The appendix is not confidently identified. Vascular/Lymphatic: Mild aortic atherosclerosis without aneurysm. No lymphadenopathy. Reproductive: Retroverted appearing uterus.  No adnexal mass. Other: Localized hematomas are identified in the left upper quadrant of the abdomen with hematocrit levels ranging in 2.7 cm and 2.9 cm in the subdiaphragmatic portion of the left upper quadrant with two larger hematomas measuring 8.3 x 7.5 x 8.3 cm on series 2/24 and similar-sized hematoma further caudad measuring 8.3 x 7.8 x 9.1 cm with demonstrable hematocrit level, series 2/45. Moderate to large volume of hemoperitoneum without active hemorrhage is identified outlining the liver and spleen and extending into the pelvis along the paracolic gutters. Diffuse soft tissue induration and edema is noted of the abdominal and pelvic wall compatible with history of bruising. No retroperitoneal hemorrhage is identified. No free air is identified. No apparent abscess. Musculoskeletal: Thoracolumbar spondylosis extending to the L2 level with marked disc  space narrowing and degenerative disc disease. Disc  bulge L4-5. No acute nor aggressive osseous lesions. IMPRESSION: 1. Fluid-hematocrit levels are identified within contained omental or mesenteric hematomas in the left hemiabdomen the largest measuring approximately 8.3 x 7.8 x 9.1 cm. 2. Complex non simple fluid is also seen, more free-flowing within the abdomen overlying the liver and spleen and extending into the pelvis compatible with stigmata of hemoperitoneum. No active focus of hemorrhage is identified on this unenhanced study. 3. Soft tissue induration of the abdominopelvic wall likely representing edema or concordant with the patient's history of bruising. 4. No retroperitoneal hemorrhage. 5. Intact gastric sleeve staple line without dehiscence identified. No adjacent free air is noted. No abscess collections are seen. No bowel obstruction is noted. These results were called by telephone at the time of interpretation on 02/01/2018 at 10:15 pm to [place] , who verbally acknowledged these results. Electronically Signed   By: Ashley Royalty M.D.   On: 02/01/2018 22:15   Dg Chest Port 1 View  Result Date: 02/03/2018 CLINICAL DATA:  Hypoxia.  Follow-up exam. EXAM: PORTABLE CHEST 1 VIEW COMPARISON:  02/02/2018 and older studies FINDINGS: Persistent lung base opacities consistent with atelectasis. Lung volumes remain low. No evidence of pulmonary edema. No new lung abnormalities. No pneumothorax. Mild cardiomegaly and stable changes from prior cardiac surgery and valve replacement. IMPRESSION: 1. No significant change from the prior study. 2. Persistent lung base opacities consistent with atelectasis. Lung volumes remain low. No evidence of pulmonary edema. Electronically Signed   By: Lajean Manes M.D.   On: 02/03/2018 09:47   Dg Chest Port 1 View  Result Date: 02/02/2018 CLINICAL DATA:  Acute hypoxia EXAM: PORTABLE CHEST 1 VIEW COMPARISON:  02/01/2018 and prior studies FINDINGS: Cardiomegaly and  cardiac valve replacement changes again noted in this low volume study. This is a low volume film with unchanged bibasilar atelectasis/opacities. No pneumothorax. There has been little interval change since the prior study. IMPRESSION: Unchanged appearance of the chest with bibasilar atelectasis/opacities in this low volume study Electronically Signed   By: Margarette Canada M.D.   On: 02/02/2018 11:56   Dg Chest Portable 1 View  Result Date: 02/01/2018 CLINICAL DATA:  Abdominal pain EXAM: PORTABLE CHEST 1 VIEW COMPARISON:  01/25/2018 FINDINGS: The heart is mildly enlarged. Stable vascular congestion. Lungs are under aerated with bibasilar atelectasis. Postoperative changes. No pneumothorax. IMPRESSION: Stable cardiomegaly and vascular congestion. Electronically Signed   By: Marybelle Killings M.D.   On: 02/01/2018 21:46        Scheduled Meds: . arformoterol  15 mcg Nebulization BID  . budesonide (PULMICORT) nebulizer solution  0.5 mg Nebulization BID  . chlorhexidine  15 mL Mouth Rinse BID  . fluticasone  2 spray Each Nare Daily  . gabapentin  200 mg Oral BID  . insulin aspart  0-9 Units Subcutaneous Q4H  . ipratropium  0.5 mg Nebulization Q6H  . levalbuterol  0.63 mg Nebulization Q6H  . loratadine  10 mg Oral Daily  . mouth rinse  15 mL Mouth Rinse q12n4p  . methylPREDNISolone (SOLU-MEDROL) injection  80 mg Intravenous Q8H  . metoprolol tartrate  2.5 mg Intravenous Q6H  . pantoprazole (PROTONIX) IV  40 mg Intravenous Daily  . protein supplement shake  6 oz Oral QID   Continuous Infusions: . ondansetron (ZOFRAN) IV    . piperacillin-tazobactam (ZOSYN)  IV 3.375 g (02/03/18 0824)     LOS: 1 day    Time spent: 50 mins    Irine Seal, MD [facility] Pager [phone]  [phone]  If 7PM-7AM, please contact night-coverage [email or web address] Password [email or web address] 02/03/2018, 10:15 AM
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
## 2018-02-04 ENCOUNTER — Inpatient Hospital Stay (HOSPITAL_COMMUNITY): Payer: Medicare Other

## 2018-02-04 DIAGNOSIS — I351 Nonrheumatic aortic (valve) insufficiency: Secondary | ICD-10-CM

## 2018-02-04 DIAGNOSIS — Z7901 Long term (current) use of anticoagulants: Secondary | ICD-10-CM

## 2018-02-04 DIAGNOSIS — Z5309 Procedure and treatment not carried out because of other contraindication: Secondary | ICD-10-CM

## 2018-02-04 DIAGNOSIS — I5031 Acute diastolic (congestive) heart failure: Secondary | ICD-10-CM

## 2018-02-04 LAB — RENAL FUNCTION PANEL
ALBUMIN: 2.9 g/dL — AB (ref 3.5–5.0)
Anion gap: 11 (ref 5–15)
BUN: 74 mg/dL — AB (ref 6–20)
CHLORIDE: 105 mmol/L (ref 98–111)
CO2: 27 mmol/L (ref 22–32)
CREATININE: 2.88 mg/dL — AB (ref 0.44–1.00)
Calcium: 9 mg/dL (ref 8.9–10.3)
GFR calc Af Amer: 20 mL/min — ABNORMAL LOW (ref >60)
GFR, EST NON AFRICAN AMERICAN: 17 mL/min — AB (ref >60)
GLUCOSE: 150 mg/dL — AB (ref 70–99)
PHOSPHORUS: 4.8 mg/dL — AB (ref 2.5–4.6)
Potassium: 5.3 mmol/L — ABNORMAL HIGH (ref 3.5–5.1)
Sodium: 143 mmol/L (ref 135–145)

## 2018-02-04 LAB — GLUCOSE, CAPILLARY
Glucose-Capillary: 104 mg/dL — ABNORMAL HIGH (ref 70–99)
Glucose-Capillary: 115 mg/dL — ABNORMAL HIGH (ref 70–99)
Glucose-Capillary: 122 mg/dL — ABNORMAL HIGH (ref 70–99)
Glucose-Capillary: 131 mg/dL — ABNORMAL HIGH (ref 70–99)
Glucose-Capillary: 134 mg/dL — ABNORMAL HIGH (ref 70–99)
Glucose-Capillary: 141 mg/dL — ABNORMAL HIGH (ref 70–99)

## 2018-02-04 LAB — CBC
HEMATOCRIT: 26.5 % — AB (ref 36.0–46.0)
Hemoglobin: 8.6 g/dL — ABNORMAL LOW (ref 12.0–15.0)
MCH: 30.7 pg (ref 26.0–34.0)
MCHC: 32.5 g/dL (ref 30.0–36.0)
MCV: 94.6 fL (ref 78.0–100.0)
PLATELETS: 243 10*3/uL (ref 150–400)
RBC: 2.8 MIL/uL — ABNORMAL LOW (ref 3.87–5.11)
RDW: 17 % — AB (ref 11.5–15.5)
WBC: 22 10*3/uL — AB (ref 4.0–10.5)

## 2018-02-04 LAB — PROTIME-INR
INR: 1.48
Prothrombin Time: 17.8 seconds — ABNORMAL HIGH (ref 11.4–15.2)

## 2018-02-04 LAB — URINE CULTURE: Culture: 20000 — AB

## 2018-02-04 LAB — ECHOCARDIOGRAM COMPLETE
Height: 65 in
Weight: 5460.35 oz

## 2018-02-04 MED ORDER — ORAL CARE MOUTH RINSE
15.0000 mL | OROMUCOSAL | Status: DC
  Administered 2018-02-04 – 2018-02-05 (×11): 15 mL via OROMUCOSAL

## 2018-02-04 MED ORDER — CHLORHEXIDINE GLUCONATE 0.12% ORAL RINSE (MEDLINE KIT)
15.0000 mL | Freq: Two times a day (BID) | OROMUCOSAL | Status: DC
  Administered 2018-02-04 – 2018-02-06 (×3): 15 mL via OROMUCOSAL

## 2018-02-04 MED ORDER — FUROSEMIDE 10 MG/ML IJ SOLN
40.0000 mg | Freq: Four times a day (QID) | INTRAMUSCULAR | Status: DC
  Administered 2018-02-04 – 2018-02-07 (×13): 40 mg via INTRAVENOUS
  Filled 2018-02-04 (×13): qty 4

## 2018-02-04 NOTE — Progress Notes (Signed)
[facility] Progress Note  Subjective: went to OR, found hemoperitoneum w/o active bleeding, has washout, no purulence noted. WBC and temp better today, BP's up and UOP excellent > 2L yesterday, creat declining.  K 5.3  Vitals:   02/04/18 0615 02/04/18 0630 02/04/18 0645 02/04/18 0700  BP:      Pulse: 81 82 80 81  Resp: 17 19 18 18   Temp:      TempSrc:      SpO2: 96% 96% 96% 96%  Weight:      Height:        Inpatient medications: . sodium chloride   Intravenous Once  . arformoterol  15 mcg Nebulization BID  . budesonide (PULMICORT) nebulizer solution  0.5 mg Nebulization BID  . fluticasone  2 spray Each Nare Daily  . insulin aspart  0-9 Units Subcutaneous Q4H  . metoprolol tartrate  2.5 mg Intravenous Q6H  . pantoprazole (PROTONIX) IV  40 mg Intravenous Daily   . acetaminophen Stopped (02/04/18 0520)  . ondansetron (ZOFRAN) IV    . piperacillin-tazobactam (ZOSYN)  IV Stopped (02/04/18 0422)  . propofol (DIPRIVAN) infusion 30 mcg/kg/min (02/04/18 0628)   fentaNYL (SUBLIMAZE) injection, fentaNYL (SUBLIMAZE) injection, fentaNYL (SUBLIMAZE) injection, ipratropium-albuterol, midazolam, midazolam, ondansetron (ZOFRAN) IV **OR** ondansetron (ZOFRAN) IV, ondansetron (ZOFRAN) IV, oxyCODONE **OR** oxyCODONE  Iron/TIBC/Ferritin/ %Sat No results found for: IRON, TIBC, FERRITIN, IRONPCTSAT   Home meds:  - metoprolol tartrate 25 bid  - bupropion 300 qd/ citalopram 20 qd/ tramadol 50 qid prn  - warfarin as directed  - symbicort 80-4.5 bid/ albuterol prn  - allopurinol 300 hx/ enoxaparin 150 bid/ montelukast 10 qd/ pantoprazole 40   Exam: Gen lethargic, not in distress, some ^wob compared to yest +JVD to angle of jaw Chest faint rales R base, o/w clear bilat RRR no MRG Abd soft ntnd no mass or ascites +bs diffuse bruising over mid and lower abdomen GU w foley draining clear yellow urine Ext chronic venous stasis bilat skin changes/ thickening w/ mild edema R>  L Neuro as above, not following commands today   9/5 UA > negative, UNa 79   Urine K 42  Urine creat 132   Impression: 1. Acute on CRF - suspect due to hypotension/ hypoperfusion.  AKI resolving w/ improved hemodynamics most likely.  No need for RRT.  Cont supportive care.  2. Fever/ ^WBC - maybe due to hemoperitoneum, improving post washout.  3. Resp failure - per CCM 4. Hyperkalemia - K+ a little up but better, making good urine now. Would not treat further at this time.  5. Anemia -due to hemoperitoneum, other. Per primary, sp 4u prbc yest.  6. CKD III - baseline creat 1.2- 1.5 7. Morbid obesity / recent lap gastric sleeve - done 01/24/18 8. Prosthetic AVR and MVR - on coumadin at home for these 9. ILD/ COPD/ OSA - on home O2   Plan - as above   Kelly Splinter MD [facility] pager [phone]   02/04/2018, 7:34 AM   Recent Labs  Lab 02/03/18 0323  02/03/18 2027 02/04/18 0427 02/04/18 0428  NA 142   < > 146*  --  143  K 6.4*   < > 5.1  --  5.3*  CL 104   < > 107  --  105  CO2 26   < > 26  --  27  GLUCOSE 172*   < > 174*  --  150*  BUN 73*   < > 75*  --  74*  CREATININE 3.65*   < > 3.33*  --  2.88*  CALCIUM 9.2   < > 9.1  --  9.0  PHOS  --   --  6.0*  --  4.8*  ALBUMIN 3.5  --  2.9*  --  2.9*  INR 1.60  --   --  1.48  --    < > = values in this interval not displayed.   Recent Labs  Lab 02/01/18 2056 02/03/18 0323  AST 36 44*  ALT 26 45*  ALKPHOS 59 65  BILITOT 1.2 1.4*  PROT 6.6 7.3   Recent Labs  Lab 02/02/18 0925  02/03/18 0323  02/03/18 2027 02/04/18 0427  WBC 27.8*  --  37.4*  --  23.7* 22.0*  NEUTROABS 22.8  --  29.7  --   --   --   HGB 7.7*   < > 8.9*   < > 8.8* 8.6*  HCT 23.2*   < > 27.5*   < > 27.0* 26.5*  MCV 93.2  --  93.5  --  93.4 94.6  PLT 398  --  408*  --  267 243   < > = values in this interval not displayed.

## 2018-02-04 NOTE — Progress Notes (Signed)
Progress Note  Patient Name: [name] Date of Encounter: 02/04/2018  Primary Cardiologist: Sinclair Grooms, MD   Subjective   The patient is intubated but communicates with eye and nodes.  Inpatient Medications    Scheduled Meds: . sodium chloride   Intravenous Once  . arformoterol  15 mcg Nebulization BID  . budesonide (PULMICORT) nebulizer solution  0.5 mg Nebulization BID  . fluticasone  2 spray Each Nare Daily  . insulin aspart  0-9 Units Subcutaneous Q4H  . metoprolol tartrate  2.5 mg Intravenous Q6H  . pantoprazole (PROTONIX) IV  40 mg Intravenous Daily   Continuous Infusions: . acetaminophen Stopped (02/04/18 0520)  . ondansetron (ZOFRAN) IV    . piperacillin-tazobactam (ZOSYN)  IV Stopped (02/04/18 0422)  . propofol (DIPRIVAN) infusion 30 mcg/kg/min (02/04/18 0628)   PRN Meds: fentaNYL (SUBLIMAZE) injection, fentaNYL (SUBLIMAZE) injection, fentaNYL (SUBLIMAZE) injection, ipratropium-albuterol, midazolam, midazolam, ondansetron (ZOFRAN) IV **OR** ondansetron (ZOFRAN) IV, ondansetron (ZOFRAN) IV, oxyCODONE **OR** oxyCODONE   Vital Signs    Vitals:   02/04/18 0645 02/04/18 0700 02/04/18 0737 02/04/18 0800  BP:      Pulse: 80 81    Resp: 18 18    Temp:    98.8 F (37.1 C)  TempSrc:    Oral  SpO2: 96% 96% 97%   Weight:      Height:        Intake/Output Summary (Last 24 hours) at 02/04/2018 0833 Last data filed at 02/04/2018 0315 Gross per 24 hour  Intake 2119.81 ml  Output 2300 ml  Net -180.19 ml   Filed Weights   02/02/18 0423 02/03/18 0332 02/04/18 0415  Weight: (!) 153.6 kg (!) 154.9 kg (!) 154.8 kg    Telemetry    SR - Personally Reviewed  Physical Exam  Intubated GEN: No acute distress.   Neck: No JVD Cardiac: RRR, systolic and diastolic clicks, no significant murmurs, rubs, or gallops.  Respiratory: Clear to auscultation bilaterally. GI: distended with significant ecchymoses MS: severe RLE lymphedema; No deformity. Neuro:   Nonfocal  Psych: Normal affect   Labs    Chemistry Recent Labs  Lab 02/01/18 2056  02/03/18 0323 02/03/18 1325 02/03/18 1609 02/03/18 2027 02/04/18 0428  NA 141   < > 142 144 140 146* 143  K 6.6*   < > 6.4* 5.4* 5.3* 5.1 5.3*  CL 108   < > 104 104  --  107 105  CO2 20*   < > 26 26  --  26 27  GLUCOSE 258*   < > 172* 165*  --  174* 150*  BUN 47*   < > 73* 80*  --  75* 74*  CREATININE 2.53*   < > 3.65* 3.91*  --  3.33* 2.88*  CALCIUM 9.4   < > 9.2 9.2  --  9.1 9.0  PROT 6.6  --  7.3  --   --   --   --   ALBUMIN 3.2*   < > 3.5  --   --  2.9* 2.9*  AST 36  --  44*  --   --   --   --   ALT 26  --  45*  --   --   --   --   ALKPHOS 59  --  65  --   --   --   --   BILITOT 1.2  --  1.4*  --   --   --   --  GFRNONAA 20*   < > 13* 12*  --  14* 17*  GFRAA 23*   < > 15* 14*  --  16* 20*  ANIONGAP 13   < > 12 14  --  13 11   < > = values in this interval not displayed.     Hematology Recent Labs  Lab 02/03/18 0323  02/03/18 1609 02/03/18 2027 02/04/18 0427  WBC 37.4*  --   --  23.7* 22.0*  RBC 2.94*  --   --  2.89* 2.80*  HGB 8.9*   < > 7.1* 8.8* 8.6*  HCT 27.5*   < > 21.0* 27.0* 26.5*  MCV 93.5  --   --  93.4 94.6  MCH 30.3  --   --  30.4 30.7  MCHC 32.4  --   --  32.6 32.5  RDW 17.4*  --   --  16.7* 17.0*  PLT 408*  --   --  267 243   < > = values in this interval not displayed.    Cardiac EnzymesNo results for input(s): TROPONINI in the last 168 hours. No results for input(s): TROPIPOC in the last 168 hours.   BNPNo results for input(s): BNP, PROBNP in the last 168 hours.   DDimer No results for input(s): DDIMER in the last 168 hours.   Radiology    US Renal  Result Date: 02/03/2018 CLINICAL DATA:  Acute renal injury. EXAM: RENAL / URINARY TRACT ULTRASOUND COMPLETE COMPARISON:  CT 02/01/2018. FINDINGS: Right Kidney: Length: 10.6 cm. Echogenicity within normal limits. Perinephric stranding. No mass or hydronephrosis visualized. Left Kidney: Length: 11.6 cm.  Echogenicity within normal limits. Perinephric stranding. No mass or hydronephrosis visualized. Bladder: Decompressed with Foley catheter. Increased echogenicity incidentally noted the liver consistent fatty infiltration and/or hepatocellular disease. Ascites incidentally noted. IMPRESSION: 1. No acute focal renal abnormality. No hydronephrosis. Mild perinephric straining consistent with edema noted. 2.  Mild ascites. 3. Echogenic liver consistent fatty infiltration and/or hepatocellular disease. Electronically Signed   By: Marcello Moores  Register   On: 02/03/2018 10:45   Dg Chest Port 1 View  Result Date: 02/04/2018 CLINICAL DATA:  [place] pharyngeal secretions EXAM: PORTABLE CHEST 1 VIEW COMPARISON:  February 03, 2018 FINDINGS: The ETT terminates 2 cm above the carina, in good position. No pneumothorax. The right central line is stable. Platelike opacity in the right base is likely atelectasis. Haziness over the left hemithorax with left basilar opacity is similar, likely an effusion with underlying atelectasis. Infiltrate would be difficult to exclude on this study. The cardiomediastinal silhouette is stable. IMPRESSION: 1. Support apparatus as above. 2. Probable effusion with underlying atelectasis on the left. Atelectasis in the right base. Electronically Signed   By: Dorise Bullion III M.D   On: 02/04/2018 07:04   Dg Chest Port 1 View  Result Date: 02/03/2018 CLINICAL DATA:  Central line placement. EXAM: PORTABLE CHEST 1 VIEW COMPARISON:  02/03/2018 FINDINGS: Right IJ approach central venous catheter terminates in the expected location of proximal superior vena cava. Endotracheal tube appropriately position. Stable postsurgical changes and valvular prosthesis noted. Cardiomediastinal silhouette is normal for portable technique. Mediastinal contours appear intact. There is no evidence of pneumothorax. Bilateral lower lobe streaky airspace opacities. Osseous structures are without acute abnormality. Soft tissues are  grossly normal. IMPRESSION: Post intubation and central line placement. No evidence of pneumothorax. Persistent bilateral lower lobe predominant streaky airspace opacities. Electronically Signed   By: Fidela Salisbury M.D.   On: 02/03/2018 19:19   Dg Chest  Port 1 View  Result Date: 02/03/2018 CLINICAL DATA:  Hypoxia.  Follow-up exam. EXAM: PORTABLE CHEST 1 VIEW COMPARISON:  02/02/2018 and older studies FINDINGS: Persistent lung base opacities consistent with atelectasis. Lung volumes remain low. No evidence of pulmonary edema. No new lung abnormalities. No pneumothorax. Mild cardiomegaly and stable changes from prior cardiac surgery and valve replacement. IMPRESSION: 1. No significant change from the prior study. 2. Persistent lung base opacities consistent with atelectasis. Lung volumes remain low. No evidence of pulmonary edema. Electronically Signed   By: Lajean Manes M.D.   On: 02/03/2018 09:47   Dg Chest Port 1 View  Result Date: 02/02/2018 CLINICAL DATA:  Acute hypoxia EXAM: PORTABLE CHEST 1 VIEW COMPARISON:  02/01/2018 and prior studies FINDINGS: Cardiomegaly and cardiac valve replacement changes again noted in this low volume study. This is a low volume film with unchanged bibasilar atelectasis/opacities. No pneumothorax. There has been little interval change since the prior study. IMPRESSION: Unchanged appearance of the chest with bibasilar atelectasis/opacities in this low volume study Electronically Signed   By: Margarette Canada M.D.   On: 02/02/2018 11:56    Cardiac Studies     Patient Profile     57 y.o. female  with a history of severe aortic stenosis and moderate mitral stenosis status post Saint Jude AVR and MVR for rheumatic heart disease.  She also has a history of postoperative atrial flutter with no recurrence, morbid obesity, diastolic CHF, chronic kidney disease stage III and hypertension, s/p elective laparoscopic sleeve gastrectomy on 01/24/2018,  Readmitted for an acute omental  and possible mesenteric hematomas with, significant Hb drop to 6.5.  Assessment & Plan     S/P DIAGNOSTIC LAPAROSCOPY EVACUATION OF HEMATOMA 02/03/2018 with Removal of 2 liters of blood yesterday, no definable bleeding source, no evidence of leak or infection. WBC and Cr improved some, high O2 requirements overnight, vent per PCCM, on antibiotics, no anticoagulation today due to continued anemia requiring transfusion. Hgb 8.8->8.6.  The patient received 2 units of PRBC yesterday in the OR, Hb 6.4-->8.3, no anticoagulation, no signs of  Valves clotting, however significant fluid overload on physical exam and CXR, also saturating 80% on the vent. I would start lasix 40 mg iv Q6H. We will obtain an echocardiogram.  For questions or updates, please contact [place] Please consult [email or web address] for contact info under        Signed, Ena Dawley, MD  02/04/2018, 8:33 AM

## 2018-02-04 NOTE — Progress Notes (Signed)
[name]  YBO:[id number] DOB: 12-20-1960 DOA: 02/01/2018 PCP: [name], FNP    Reason for Consult / Chief Complaint:  Hypercarbic resp failure  Consulting MD:  Dr. 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  She follows with Dr. [place] [name] in the clinic. 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 No evidence of vasculitis.  Underwent aortic, mitral valve replacement in 2016, on anticoag.  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  Transferred to SDU on 12 lt O2. PCCM consulted for help with management.  Respiratory status improved with incentive spirometry Course complicated by hypotension in setting of acute blood loss anemia 8/29, acute on chronic renal failure.  She was discharged on 9/3 on anticoagulation for her mechanical valves  9/4-Readmitted with AKI, hyperkalemia, acute omental, mesenteric hematomas, hemoperitoneum with hemoglobin of 6.5, leukocytosis.  Started on antibiotics, renal consulted. Anticoagulation held and cardiology consulted. 9/6-PCCM consulted for altered mental status after she received Dilaudid.  ABG shows hypercarbia. Taken back to OR for diagnostic laparotomy, 2 L of blood evacuated.  No active bleeding noted.  No evidence of abscess or infection.  Subjective / Interval Events  Remains on the vent, awakens to command.   Objective   Blood pressure (!) 109/56, pulse 99, temperature (!) 100.5 F (38.1 C), temperature source Axillary, resp. rate 19, height 5\' 5"  (1.651 m), weight (!) 154.9 kg, SpO2 93 %.   Intake/Output Summary (Last 24 hours) at 02/03/2018 1008 Last data filed at 02/03/2018 0730 Gross per 24  hour  Intake 823.27 ml  Output 1050 ml  Net -226.73 ml   Filed Weights   02/01/18 2028 02/02/18 0423 02/03/18 0332  Weight: (!) 154.2 kg (!) 153.6 kg (!) 154.9 kg   Examination:. Gen:      No acute distress HEENT:  EOMI, sclera anicteric, ET tube Neck:     No masses; no thyromegaly Lungs:    Clear to auscultation bilaterally; normal respiratory effort CV:         Regular rate and rhythm; no murmurs Abd:      Tender abdomen, diminished bowel sounds, ecchymosis, hematoma over the abdominal wall. Ext:    No edema; adequate peripheral perfusion Skin:      Warm and dry; no rash Neuro: Oriented, arousable, no focal deficits.  Consults: date of consult/date signed off & final recs:  Cardiology 9/5-no anticoagulation for mechanical walls due to bleeding Nephrology 9/5-monitor renal function, potassium.  No emergent need for dialysis ID 9/6-continue Zosyn  Procedures:   Significant Diagnostic Tests: Sleep study 03/04/17 - No significant obstructive sleep apnea occurred during this study (AHI = 1.3/h). - No significant central sleep apnea occurred during this study (CAI = 0.0/h). - Mild oxygen desaturation was noted during this study (Min O2 = 87.00%). - The patient snored with soft snoring volume.  Lung biopsy 10/03/2013- hemosiderin laden lung macrophages, mild chronic fibrosis, no vasculitis.  CT the abdomen pelvis 9/4-subsegmental atelectasis in the lungs, omental, mesenteric hematomas, hemoperitoneum, soft tissue induration of the abdominal pelvic wall. Chest x-ray 9/6-cardiomegaly, low lung volumes. I have reviewed the images personally.  Micro Data: Blood cultures 9/5- Urine culture 9/5-  Antimicrobials:  Zosyn 9/5 >>  Objective   Gen:      No acute distress, obese HEENT:  EOMI, sclera anicteric Neck:     No masses; no thyromegaly Lungs:    Clear to auscultation, no wheeze or crackles CV:         Mechanical valve click, no murmurs appreciated. Abd:   Distended abdomen  with abdominal wall hematoma, ecchymosis Ext:    1+ edema; adequate peripheral perfusion Skin:      Warm and dry; no rash Neuro: Somnolent, awakens to voice.  No focal deficits.  Assessment & Plan:  57 year old with history of interstitial lung disease, asthma, obesity admitted with AKI, hyperkalemia, anemia from postoperative bleed PCCM consulted for hypercarbic respiratory failure, altered mental status  Acute hypercarbic respiratory failure History of ILD, asthma, chronic hypoxic respiratory failure. Now intubated after exploratory laparotomy yesterday Wean down FiO2.  Start pressure support weans once she is on minimal vent settings. Recheck ABG Continue Brovana, Pulmicort and duo nebs as needed.  History of hypertension, aortic, mitral valve replacement Off anticoagulation due to acute bleed.  Difficult situation as she is at high risk for development of valve thrombosis. Cardiology on board. Continue Lasix, Lopressor.  AKI, hyperkalemia Improving creatinine, potassium which is encouraging Discussed with nephrology.  Hold off on dialysis  Fever, leukocytosis Renew Zosyn, follow cultures.  Diabetes SSI coverage  Best Practice / Goals of Care / Disposition.   DVT prophylaxis: SCD GI prophylaxis: Protonix Diet: NPO Mobility: Bedrest Code Status: Full Family Communication: Pt updated 9/6, sister updated at bedside 9/7  Disposition / Summary of Today's Plan 02/04/18   Wean down FiO2, start pressure support weans Hopeful for extubation within the next 24 hours.  Labs   CBC: Recent Labs  Lab 02/01/18 2056  02/02/18 0357 02/02/18 0925  02/03/18 0323 02/03/18 1325 02/03/18 1609 02/03/18 2027 02/04/18 0427  WBC 24.2*  --  27.5* 27.8*  --  37.4*  --   --  23.7* 22.0*  NEUTROABS 21.1  --   --  22.8  --  29.7  --   --   --   --   HGB 6.7*   < > 6.8* 7.7*   < > 8.9* 7.9* 7.1* 8.8* 8.6*  HCT 21.2*   < > 20.9* 23.2*   < > 27.5* 23.8* 21.0* 27.0* 26.5*  MCV 100.0  --   95.4 93.2  --  93.5  --   --  93.4 94.6  PLT 466*  --  392 398  --  408*  --   --  267 243   < > = values in this interval not displayed.   Basic Metabolic Panel: Recent Labs  Lab 02/02/18 0925 02/02/18 2121 02/03/18 0323 02/03/18 1325 02/03/18 1609 02/03/18 2027 02/04/18 0428  NA 142 142 142 144 140 146* 143  K 6.7* 6.5* 6.4* 5.4* 5.3* 5.1 5.3*  CL 106 105 104 104  --  107 105  CO2 23 25 26 26   --  26 27  GLUCOSE 164* 166* 172* 165*  --  174* 150*  BUN 58* 65* 73* 80*  --  75* 74*  CREATININE 3.02* 3.34* 3.65* 3.91*  --  3.33* 2.88*  CALCIUM 9.3 9.4 9.2 9.2  --  9.1 9.0  PHOS 6.5*  --   --   --   --  6.0* 4.8*   GFR: Estimated Creatinine Clearance: 32.3 mL/min (A) (by C-G formula based on SCr of 2.88 mg/dL (H)). Recent Labs  Lab 02/02/18 [phone] 02/03/18  7588 02/03/18 2027 02/04/18 0427  WBC 27.8* 37.4* 23.7* 22.0*   Liver Function Tests: Recent Labs  Lab 02/01/18 2056 02/02/18 0925 02/03/18 0323 02/03/18 2027 02/04/18 0428  AST 36  --  44*  --   --   ALT 26  --  45*  --   --   ALKPHOS 59  --  65  --   --   BILITOT 1.2  --  1.4*  --   --   PROT 6.6  --  7.3  --   --   ALBUMIN 3.2* 3.2* 3.5 2.9* 2.9*   Recent Labs  Lab 02/01/18 2056  LIPASE 57*   No results for input(s): AMMONIA in the last 168 hours. ABG    Component Value Date/Time   PHART 7.318 (L) 02/03/2018 1938   PCO2ART 49.3 (H) 02/03/2018 1938   PO2ART 84.7 02/03/2018 1938   HCO3 24.6 02/03/2018 1938   TCO2 29 02/03/2018 1609   ACIDBASEDEF 1.1 02/03/2018 1938   O2SAT 96.3 02/03/2018 1938    Coagulation Profile: Recent Labs  Lab 01/31/18 0502 02/01/18 2121 02/02/18 0357 02/03/18 0323 02/04/18 0427  INR 1.42 1.95 2.08 1.60 1.48   Cardiac Enzymes: No results for input(s): CKTOTAL, CKMB, CKMBINDEX, TROPONINI in the last 168 hours. HbA1C: No results found for: HGBA1C CBG: Recent Labs  Lab 02/03/18 1155 02/03/18 1924 02/03/18 2327 02/04/18 0332 02/04/18 0722  GLUCAP 144* 175* 154*  141* 131*     Review of Systems:   Unable to obtain as patient is on vent.  The patient is critically ill with multiple organ system failure and requires high complexity decision making for assessment and support, frequent evaluation and titration of therapies, advanced monitoring, review of radiographic studies and interpretation of complex data.   Critical Care Time devoted to patient care services, exclusive of separately billable procedures, described in this note is 35 minutes.   Marshell Garfinkel MD [facility] 02/04/2018, 9:41 AM

## 2018-02-04 NOTE — Progress Notes (Signed)
  Echocardiogram 2D Echocardiogram has been performed.  [name] 02/04/2018, 3:29 PM

## 2018-02-04 NOTE — Progress Notes (Signed)
Progress Note: General Surgery Service   Assessment/Plan: Principal Problem:   Fever Active Problems:   Chronic asthmatic bronchitis (HCC)   ILD (interstitial lung disease) ([place])   History of mitral valve replacement with mechanical valve   Chronic respiratory failure (HCC)   Chronic diastolic heart failure (HCC)   Obstructive sleep apnea   Morbid obesity with BMI of 50.0-59.9, adult (HCC)   Chronic acquired lymphedema - Right lower extremity   Hx of mechanical aortic valve replacement   Acute renal failure superimposed on stage 3 chronic kidney disease (HCC)   Gout   HX: long term anticoagulant use   Warfarin anticoagulation   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 WBC and Cr improved some, high O2 requirements overnight -continue vent per PCCM -continue abx -no anticoagulation today due to continued anemia requiring transfusion. Hgb 8.8->8.6    LOS: 2 days  Chief Complaint/Subjective: OR yesterday for hematoma evacuation  Objective: Vital signs in last 24 hours: Temp:  [98 F (36.7 C)-98.8 F (37.1 C)] 98 F (36.7 C) (09/07 0347) Pulse Rate:  [59-103] 81 (09/07 0700) Resp:  [16-28] 18 (09/07 0700) BP: (98-132)/(34-61) 132/61 (09/07 0315) SpO2:  [89 %-99 %] 97 % (09/07 0737) Arterial Line BP: (103-153)/(48-70) 134/61 (09/07 0700) FiO2 (%):  [50 %-100 %] 70 % (09/07 0737) Weight:  [154.8 kg] 154.8 kg (09/07 0415) Last BM Date: 02/03/18  Intake/Output from previous day: 09/06 0701 - 09/07 0700 In: 2169.7 [I.V.:1600; Blood:500;  IV Piggyback:69.7] Out: 2400 [Urine:2350; Blood:50] Intake/Output this shift: No intake/output data recorded.  Lungs: coarse b/l  Cardiovascular: RRR  Abd: soft, ecchymosis over all abdomen, incisions intact   Lab Results: CBC  Recent Labs    02/03/18 2027 02/04/18 0427  WBC 23.7* 22.0*  HGB 8.8* 8.6*  HCT 27.0* 26.5*  PLT 267 243   BMET Recent Labs    02/03/18 2027 02/04/18 0428  NA 146* 143  K 5.1 5.3*  CL 107 105  CO2 26 27  GLUCOSE 174* 150*  BUN 75* 74*  CREATININE 3.33* 2.88*  CALCIUM 9.1 9.0   PT/INR Recent Labs    02/03/18 0323 02/04/18 0427  LABPROT 18.9* 17.8*  INR 1.60 1.48   ABG Recent Labs    02/03/18 1609 02/03/18 1938  PHART 7.325* 7.318*  HCO3 27.8 24.6    Studies/Results:  Anti-infectives: Anti-infectives (From admission, onward)   Start     Dose/Rate Route Frequency Ordered Stop   02/02/18 0800  piperacillin-tazobactam (ZOSYN) IVPB 3.375 g     3.375 g 12.5 mL/hr over 240 Minutes Intravenous Every 8 hours 02/02/18 0300     02/01/18 2245  piperacillin-tazobactam (ZOSYN) IVPB 3.375 g     3.375 g 100 mL/hr over 30 Minutes Intravenous NOW 02/01/18 2234 02/02/18 0203      Medications: Scheduled Meds: . sodium chloride   Intravenous Once  . arformoterol  15 mcg Nebulization BID  . budesonide (PULMICORT) nebulizer solution  0.5 mg Nebulization BID  . fluticasone  2 spray Each Nare Daily  . insulin aspart  0-9 Units Subcutaneous Q4H  . metoprolol tartrate  2.5 mg Intravenous Q6H  . pantoprazole (PROTONIX) IV  40 mg Intravenous Daily   Continuous Infusions: . acetaminophen Stopped (02/04/18 0520)  . ondansetron (ZOFRAN) IV    . piperacillin-tazobactam (ZOSYN)  IV Stopped (02/04/18 0422)  . propofol (DIPRIVAN) infusion 30 mcg/kg/min (02/04/18 0628)   PRN Meds:.fentaNYL (SUBLIMAZE) injection, fentaNYL (SUBLIMAZE) injection, fentaNYL (SUBLIMAZE) injection, ipratropium-albuterol, midazolam, midazolam, ondansetron (ZOFRAN) IV  **OR** ondansetron (ZOFRAN) IV, ondansetron (ZOFRAN) IV, oxyCODONE **OR** oxyCODONE  Mickeal Skinner, MD Pg# [phone] [facility]

## 2018-02-04 NOTE — Progress Notes (Signed)
INFECTIOUS DISEASE PROGRESS NOTE  ID: [name] is a 57 y.o. 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   Postoperative anemia due to chronic blood loss on full anticoagulation   Hyperkalemia   Mesenteric hematoma   Hemoperitoneum   Anemia   Acute on chronic respiratory failure with hypoxia and hypercapnia (HCC)   Acute metabolic encephalopathy   Leukocytosis   Contraindication to anticoagulation therapy   Acute diastolic CHF (congestive heart failure), NYHA class 4 (HCC)  Subjective: Per caregiver in room, wants ET tube out  Abtx:  Anti-infectives (From admission, onward)   Start     Dose/Rate Route Frequency Ordered Stop   02/02/18 0800  piperacillin-tazobactam (ZOSYN) IVPB 3.375 g     3.375 g 12.5 mL/hr over 240 Minutes Intravenous Every 8 hours 02/02/18 0300     02/01/18 2245  piperacillin-tazobactam (ZOSYN) IVPB 3.375 g     3.375 g 100 mL/hr over 30 Minutes Intravenous NOW 02/01/18 2234 02/02/18 0203      Medications:  Scheduled: . sodium chloride   Intravenous Once  . arformoterol  15 mcg Nebulization BID  . budesonide (PULMICORT) nebulizer solution  0.5 mg Nebulization BID  . chlorhexidine gluconate (MEDLINE KIT)  15 mL Mouth Rinse BID  . fluticasone  2 spray Each Nare Daily  . furosemide  40 mg Intravenous Q6H  . insulin aspart  0-9 Units Subcutaneous Q4H  . mouth  rinse  15 mL Mouth Rinse 10 times per day  . metoprolol tartrate  2.5 mg Intravenous Q6H  . pantoprazole (PROTONIX) IV  40 mg Intravenous Daily    Objective: Vital signs in last 24 hours: Temp:  [98 F (36.7 C)-99.2 F (37.3 C)] 99.2 F (37.3 C) (09/07 1530) Pulse Rate:  [59-93] 79 (09/07 1530) Resp:  [16-28] 19 (09/07 1530) BP: (107-141)/(42-65) 128/56 (09/07 1135) SpO2:  [89 %-97 %] 93 % (09/07 1530) Arterial Line BP: (103-160)/(48-74) 140/67 (09/07 1530) FiO2 (%):  [60 %-100 %] 60 % (09/07 1135) Weight:  [154.8 kg] 154.8 kg (09/07 0415)   General appearance: no distress Resp: rhonchi anterior - bilateral Cardio: regular rate and rhythm and ejection click present GI: abnormal findings:  hypoactive bowel sounds Extremities: edema >3+  Lab Results Recent Labs    02/03/18 2027 02/04/18 0427 02/04/18 0428  WBC 23.7* 22.0*  --   HGB 8.8* 8.6*  --   HCT 27.0* 26.5*  --   NA 146*  --  143  K 5.1  --  5.3*  CL 107  --  105  CO2 26  --  27  BUN 75*  --  74*  CREATININE 3.33*  --  2.88*   Liver Panel Recent Labs    02/01/18 2056  02/03/18 0323 02/03/18 2027 02/04/18 0428  PROT 6.6  --  7.3  --   --   ALBUMIN 3.2*   < >  3.5 2.9* 2.9*  AST 36  --  44*  --   --   ALT 26  --  45*  --   --   ALKPHOS 59  --  65  --   --   BILITOT 1.2  --  1.4*  --   --    < > = values in this interval not displayed.   Sedimentation Rate No results for input(s): ESRSEDRATE in the last 72 hours. C-Reactive Protein No results for input(s): CRP in the last 72 hours.  Microbiology: Recent Results (from the past 240 hour(s))  MRSA PCR Screening     Status: None   Collection Time: 01/26/18  1:51 PM  Result Value Ref Range Status   MRSA by PCR NEGATIVE NEGATIVE Final    Comment:        The GeneXpert MRSA Assay (FDA approved for NASAL specimens only), is one component of a comprehensive MRSA colonization surveillance program. It is not intended to diagnose MRSA infection nor to  guide or monitor treatment for MRSA infections. Performed at [facility], [place] [address] [place], [place] [address]   Culture, Urine     Status: Abnormal   Collection Time: 02/02/18 10:03 AM  Result Value Ref Range Status   Specimen Description   Final    URINE, RANDOM Performed at [facility] [address], [place], [place] [address]    Special Requests   Final    NONE Performed at [facility], [place] [address], [place], [place] [address]    Culture 20,000 COLONIES/mL ENTEROCOCCUS FAECALIS (A)  Final   Report Status 02/04/2018 FINAL  Final   Organism ID, Bacteria ENTEROCOCCUS FAECALIS (A)  Final      Susceptibility   Enterococcus faecalis - MIC*    AMPICILLIN <=2 SENSITIVE Sensitive     LEVOFLOXACIN 0.5 SENSITIVE Sensitive     NITROFURANTOIN <=16 SENSITIVE Sensitive     VANCOMYCIN 2 SENSITIVE Sensitive     * 20,000 COLONIES/mL ENTEROCOCCUS FAECALIS  Culture, blood (Routine X 2) w Reflex to ID Panel     Status: None (Preliminary result)   Collection Time: 02/02/18 10:59 AM  Result Value Ref Range Status   Specimen Description   Final    BLOOD LEFT ARM Performed at [facility] [address], [place], [place] [address]    Special Requests   Final    BOTTLES DRAWN AEROBIC AND ANAEROBIC Blood Culture adequate volume Performed at [facility] [address] [place], [place] [address]    Culture   Final    NO GROWTH 2 DAYS Performed at [facility] [address] [place], [place] [address]    Report Status PENDING  Incomplete  Culture, blood (Routine X 2) w Reflex to ID Panel     Status: None (Preliminary result)   Collection Time: 02/02/18 11:01 AM  Result Value Ref Range Status   Specimen Description   Final    BLOOD LEFT ARM Performed at [facility] [address], [place], [place] [address]    Special Requests   Final    BOTTLES DRAWN  AEROBIC AND ANAEROBIC Blood Culture results may not be optimal due to an inadequate volume of blood received in culture bottles Performed at [facility] [address], [place], [place] [address]    Culture   Final    NO GROWTH 2 DAYS Performed at [facility], [address], [place], [place] [address]    Report Status PENDING  Incomplete  Aerobic/Anaerobic Culture (surgical/deep wound)     Status: None (Preliminary result)   Collection Time: 02/03/18  4:20 PM  Result Value Ref Range Status   Specimen Description   Final    BLOOD VESSEL Performed at [facility] [address], [place], [place] [address]    Special Requests   Final    NONE Performed at [facility], [place] [address] [place], [place] [address]    Gram Stain   Final    RARE WBC PRESENT,BOTH PMN AND MONONUCLEAR NO ORGANISMS SEEN    Culture   Final    NO GROWTH < 24 HOURS Performed at [facility], [place] [address], [place], [place] [address]    Report Status PENDING  Incomplete    Studies/Results: US Renal  Result Date: 02/03/2018 CLINICAL DATA:  Acute renal injury. EXAM: RENAL / URINARY TRACT ULTRASOUND COMPLETE COMPARISON:  CT 02/01/2018. FINDINGS: Right Kidney: Length: 10.6 cm. Echogenicity within normal limits. Perinephric stranding. No mass or hydronephrosis visualized. Left Kidney: Length: 11.6 cm. Echogenicity within normal limits. Perinephric stranding. No mass or hydronephrosis visualized. Bladder: Decompressed with Foley catheter. Increased echogenicity incidentally noted the liver consistent fatty infiltration and/or hepatocellular disease. Ascites incidentally noted. IMPRESSION: 1. No acute focal renal abnormality. No hydronephrosis. Mild perinephric straining consistent with edema noted. 2.  Mild ascites. 3. Echogenic liver consistent fatty infiltration and/or hepatocellular disease. Electronically Signed   By: Marcello Moores  Register   On:  02/03/2018 10:45   Dg Chest Port 1 View  Result Date: 02/04/2018 CLINICAL DATA:  [place] pharyngeal secretions EXAM: PORTABLE CHEST 1 VIEW COMPARISON:  February 03, 2018 FINDINGS: The ETT terminates 2 cm above the carina, in good position. No pneumothorax. The right central line is stable. Platelike opacity in the right base is likely atelectasis. Haziness over the left hemithorax with left basilar opacity is similar, likely an effusion with underlying atelectasis. Infiltrate would be difficult to exclude on this study. The cardiomediastinal silhouette is stable. IMPRESSION: 1. Support apparatus as above. 2. Probable effusion with underlying atelectasis on the left. Atelectasis in the right base. Electronically Signed   By: Dorise Bullion III M.D   On: 02/04/2018 07:04   Dg Chest Port 1 View  Result Date: 02/03/2018 CLINICAL DATA:  Central line placement. EXAM: PORTABLE CHEST 1 VIEW COMPARISON:  02/03/2018 FINDINGS: Right IJ approach central venous catheter terminates in the expected location of proximal superior vena cava. Endotracheal tube appropriately position. Stable postsurgical changes and valvular prosthesis noted. Cardiomediastinal silhouette is normal for portable technique. Mediastinal contours appear intact. There is no evidence of pneumothorax. Bilateral lower lobe streaky airspace opacities. Osseous structures are without acute abnormality. Soft tissues are grossly normal. IMPRESSION: Post intubation and central line placement. No evidence of pneumothorax. Persistent bilateral lower lobe predominant streaky airspace opacities. Electronically Signed   By: Fidela Salisbury M.D.   On: 02/03/2018 19:19   Dg Chest Port 1 View  Result Date: 02/03/2018 CLINICAL DATA:  Hypoxia.  Follow-up exam. EXAM: PORTABLE CHEST 1 VIEW COMPARISON:  02/02/2018 and older studies FINDINGS: Persistent lung base opacities consistent with atelectasis. Lung volumes remain low. No evidence of pulmonary edema. No new lung  abnormalities. No pneumothorax. Mild cardiomegaly and stable changes from prior cardiac surgery and valve replacement. IMPRESSION: 1. No significant change from the prior study. 2. Persistent lung base opacities consistent with atelectasis. Lung volumes remain low.  No evidence of pulmonary edema. Electronically Signed   By: Lajean Manes M.D.   On: 02/03/2018 09:47     Assessment/Plan: Sleeve gastrectomy (01-24-18) Intra-abd hemorrhage (debrided 02-03-18) Post-op anemia Morbid obesity  Total days of antibiotics: 1 zosyn  She's been afebrile since yesterday AM Suspect her fever and leukocytosis were from her intra-abd hemorrhage.  Await her BCx and Op Cx, and will continue her anbx for now.  Available as needed 9-8.          [name] MD, FACP Infectious Diseases (pager) [phone] [email or web address] 02/04/2018, 3:48 PM  LOS: 2 days

## 2018-02-05 ENCOUNTER — Inpatient Hospital Stay (HOSPITAL_COMMUNITY): Payer: Medicare Other

## 2018-02-05 DIAGNOSIS — J9621 Acute and chronic respiratory failure with hypoxia: Secondary | ICD-10-CM

## 2018-02-05 DIAGNOSIS — J9622 Acute and chronic respiratory failure with hypercapnia: Secondary | ICD-10-CM

## 2018-02-05 LAB — BLOOD GAS, ARTERIAL
ACID-BASE EXCESS: 4.9 mmol/L — AB (ref 0.0–2.0)
Acid-Base Excess: 5 mmol/L — ABNORMAL HIGH (ref 0.0–2.0)
Bicarbonate: 29.4 mmol/L — ABNORMAL HIGH (ref 20.0–28.0)
Bicarbonate: 30.2 mmol/L — ABNORMAL HIGH (ref 20.0–28.0)
DRAWN BY: 308601
Drawn by: 257881
EXPIRATORY PAP: 5
FIO2: 50
FIO2: 45
INSPIRATORY PAP: 5
Mode: POSITIVE
O2 SAT: 95.2 %
O2 SAT: 91.1 %
PCO2 ART: 45.7 mmHg (ref 32.0–48.0)
PEEP: 5 cmH2O
PH ART: 7.38 (ref 7.350–7.450)
PO2 ART: 77.2 mmHg — AB (ref 83.0–108.0)
PO2 ART: 65.7 mmHg — AB (ref 83.0–108.0)
Patient temperature: 98.6
Patient temperature: 37
RATE: 16 resp/min
VT: 460 mL
pCO2 arterial: 52.3 mmHg — ABNORMAL HIGH (ref 32.0–48.0)
pH, Arterial: 7.425 (ref 7.350–7.450)

## 2018-02-05 LAB — BPAM RBC
BLOOD PRODUCT EXPIRATION DATE: 201909272359
BLOOD PRODUCT EXPIRATION DATE: 201910052359
BLOOD PRODUCT EXPIRATION DATE: 201910052359
Blood Product Expiration Date: 201909272359
Blood Product Expiration Date: 201909272359
Blood Product Expiration Date: 201910052359
ISSUE DATE / TIME: 201909042228
ISSUE DATE / TIME: 201909051227
ISSUE DATE / TIME: 201909061522
ISSUE DATE / TIME: 201909050520
ISSUE DATE / TIME: 201909051537
ISSUE DATE / TIME: 201909061522
UNIT TYPE AND RH: 5100
UNIT TYPE AND RH: 5100
Unit Type and Rh: 5100
Unit Type and Rh: 5100
Unit Type and Rh: 5100
Unit Type and Rh: 5100

## 2018-02-05 LAB — TYPE AND SCREEN
ABO/RH(D): O POS
Antibody Screen: NEGATIVE
UNIT DIVISION: 0
UNIT DIVISION: 0
Unit division: 0
Unit division: 0
Unit division: 0
Unit division: 0

## 2018-02-05 LAB — CBC
HCT: 27.1 % — ABNORMAL LOW (ref 36.0–46.0)
Hemoglobin: 8.5 g/dL — ABNORMAL LOW (ref 12.0–15.0)
MCH: 30.4 pg (ref 26.0–34.0)
MCHC: 31.4 g/dL (ref 30.0–36.0)
MCV: 96.8 fL (ref 78.0–100.0)
PLATELETS: 264 10*3/uL (ref 150–400)
RBC: 2.8 MIL/uL — AB (ref 3.87–5.11)
RDW: 17.3 % — ABNORMAL HIGH (ref 11.5–15.5)
WBC: 15.1 10*3/uL — ABNORMAL HIGH (ref 4.0–10.5)

## 2018-02-05 LAB — BASIC METABOLIC PANEL
Anion gap: 11 (ref 5–15)
BUN: 80 mg/dL — ABNORMAL HIGH (ref 6–20)
CALCIUM: 9 mg/dL (ref 8.9–10.3)
CHLORIDE: 105 mmol/L (ref 98–111)
CO2: 30 mmol/L (ref 22–32)
CREATININE: 2.42 mg/dL — AB (ref 0.44–1.00)
GFR calc Af Amer: 24 mL/min — ABNORMAL LOW (ref >60)
GFR calc non Af Amer: 21 mL/min — ABNORMAL LOW (ref >60)
GLUCOSE: 105 mg/dL — AB (ref 70–99)
Potassium: 4.8 mmol/L (ref 3.5–5.1)
Sodium: 146 mmol/L — ABNORMAL HIGH (ref 135–145)

## 2018-02-05 LAB — GLUCOSE, CAPILLARY
GLUCOSE-CAPILLARY: 89 mg/dL (ref 70–99)
GLUCOSE-CAPILLARY: 90 mg/dL (ref 70–99)
GLUCOSE-CAPILLARY: 85 mg/dL (ref 70–99)
Glucose-Capillary: 105 mg/dL — ABNORMAL HIGH (ref 70–99)
Glucose-Capillary: 83 mg/dL (ref 70–99)

## 2018-02-05 LAB — PHOSPHORUS: PHOSPHORUS: 3.7 mg/dL (ref 2.5–4.6)

## 2018-02-05 LAB — MAGNESIUM: Magnesium: 2.4 mg/dL (ref 1.7–2.4)

## 2018-02-05 MED ORDER — HEPARIN (PORCINE) IN NACL 100-0.45 UNIT/ML-% IJ SOLN
1900.0000 [IU]/h | INTRAMUSCULAR | Status: DC
  Administered 2018-02-05 – 2018-02-06 (×2): 1900 [IU]/h via INTRAVENOUS
  Filled 2018-02-05 (×3): qty 250

## 2018-02-05 MED ORDER — HYDROMORPHONE HCL 1 MG/ML IJ SOLN: 0.2500 mg | INTRAMUSCULAR | Status: DC | PRN

## 2018-02-05 MED ORDER — PREMIER PROTEIN SHAKE
6.0000 [oz_av] | Freq: Four times a day (QID) | ORAL | Status: DC
  Administered 2018-02-06 – 2018-02-07 (×2): 6 [oz_av] via ORAL
  Filled 2018-02-05 (×8): qty 325.31

## 2018-02-05 MED ORDER — ACETAMINOPHEN 10 MG/ML IV SOLN
1000.0000 mg | Freq: Four times a day (QID) | INTRAVENOUS | Status: DC
  Administered 2018-02-05 – 2018-02-06 (×3): 1000 mg via INTRAVENOUS
  Filled 2018-02-05 (×3): qty 100

## 2018-02-05 MED ORDER — TRAMADOL HCL 50 MG PO TABS: 25.0000 mg | ORAL_TABLET | Freq: Four times a day (QID) | ORAL | Status: DC | PRN

## 2018-02-05 MED ORDER — HEPARIN (PORCINE) IN NACL 100-0.45 UNIT/ML-% IJ SOLN: 1900.0000 [IU]/h | INTRAMUSCULAR | Status: DC

## 2018-02-05 NOTE — Anesthesia Postprocedure Evaluation (Signed)
Anesthesia Post Note  Patient: [name]  Procedure(s) Performed: DIAGNOSTIC LAPAROSCOPY EVACUATION OF HEMATOMA (N/A Abdomen)     Patient location during evaluation: SICU Anesthesia Type: General Level of consciousness: sedated Pain management: pain level controlled Vital Signs Assessment: post-procedure vital signs reviewed and stable Respiratory status: patient remains intubated per anesthesia plan Cardiovascular status: stable Postop Assessment: no apparent nausea or vomiting Anesthetic complications: no    Last Vitals:  Vitals:   02/05/18 0515 02/05/18 0530  BP:    Pulse: 69 66  Resp: (!) 23 (!) 22  Temp:    SpO2: 95% 95%    Last Pain:  Vitals:   02/05/18 0347  TempSrc: Axillary  PainSc:                  [name] S

## 2018-02-05 NOTE — Progress Notes (Signed)
Doctor ordered for Pt to be extubated. Pt able to follow directions, leak test passed, RT suctioned the Pt alert. RT extubated the Pt to 15 L [place]. SATS 95%, RR 20, HR 101 and BBS diminished.Pt was able to cough and speak.  RT will continue to monitor.

## 2018-02-05 NOTE — Progress Notes (Signed)
Initial Nutrition Assessment  DOCUMENTATION CODES:   Morbid obesity  INTERVENTION:   If enteral feeds initiated: Recommend Prostat supplements 60 ml QID. This provides + current Propofol infusion, 1536 kcal and 120g protein.  Will monitor for plan  NUTRITION DIAGNOSIS:   Inadequate oral intake related to inability to eat as evidenced by NPO status.  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 8/27: s/p sleeve gastrectomy  Following sleeve surgery pt had a prolonged admission d/t hypotension. Pt was discharged on 9/3. Pt was readmitted 9/4 for mesenteric hematomas.  Pt underwent DIAGNOSTIC LAPAROSCOPY; LAPAROSCOPIC IRRIGATION  & WASHOUT OF ABDOMINAL BLOOD/HEMATOMA on 9/6. Pt was intubated on 9/6.  Per MD note today, plan is for patient to be extubated today. Will provide TF recommendations above in case pt is unable to be extubated.  Patient is currently intubated on ventilator support MV: 8.2 L/min Temp (24hrs), Avg:99.1 F (37.3 C), Min:98.7 F (37.1 C), Max:100 F (37.8 C)  Propofol: 27.9 ml/hr -provides 736 kcal  Medications: IV Lasix every 6 hours   Labs reviewed: Elevated Na Mg/Phos WNL GFR: 21  NUTRITION - FOCUSED PHYSICAL EXAM:  Nutrition focused physical exam shows no sign of depletion of muscle mass or body fat.  Pt with severe edema.  Diet Order:   Diet Order            Diet NPO time specified  Diet effective now              EDUCATION NEEDS:   Not appropriate for education at this time  Skin:  Skin Assessment: Reviewed RN Assessment  Last BM:  9/6  Height:   Ht Readings from Last 1 Encounters:  02/03/18 5\' 5"  (1.651 m)     Weight:   Wt Readings from Last 1 Encounters:  02/04/18 (!) 154.8 kg    Ideal Body Weight:  56.8 kg  BMI:  Body mass index is 56.79 kg/m.  Estimated Nutritional Needs:   Kcal:  1250-1420  Protein:  135-145g  Fluid:  1.8L/day  Clayton Bibles, MS, RD, LDN [place] Diet[facility] Pager: [phone] After Hours Pager: [phone]

## 2018-02-05 NOTE — Progress Notes (Addendum)
Progress Note: General Surgery Service   Assessment/Plan: Principal Problem:   Fever Active Problems:   Chronic asthmatic bronchitis (HCC)   ILD (interstitial lung disease) ([place])   History of mitral valve replacement with mechanical valve   Chronic respiratory failure (HCC)   Chronic diastolic heart failure (HCC)   Obstructive sleep apnea   Morbid obesity with BMI of 50.0-59.9, adult (HCC)   Chronic acquired lymphedema - Right lower extremity   Hx of mechanical aortic valve replacement   Acute renal failure superimposed on stage 3 chronic kidney disease (HCC)   Gout   HX: long term anticoagulant use   Warfarin anticoagulation   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   Postoperative anemia due to chronic blood loss on full anticoagulation   Hyperkalemia   Mesenteric hematoma   Hemoperitoneum   Anemia   Acute on chronic respiratory failure with hypoxia and hypercapnia (HCC)   Acute metabolic encephalopathy   Leukocytosis   Contraindication to anticoagulation therapy   Acute diastolic CHF (congestive heart failure), NYHA class 4 (HCC)  s/p Procedure(s): DIAGNOSTIC LAPAROSCOPY EVACUATION OF HEMATOMA 02/03/2018 -continue abx -24h with stable hemoglobin and decreasing WBC -ok with no bolus heparin at 6pm    LOS: 3 days  Chief Complaint/Subjective: No events overnight, able to wean O2  Objective: Vital signs in last 24 hours: Temp:  [98.7 F (37.1 C)-100 F (37.8 C)] 98.7 F (37.1 C) (09/08 0800) Pulse Rate:  [59-81] 71 (09/08 0729) Resp:  [15-30] 26 (09/08 0729) BP: (126-157)/(56-69) 157/69 (09/08 0729) SpO2:  [90 %-96 %] 94 % (09/08 0729) Arterial Line BP: (109-161)/(49-74) 159/70 (09/08 0530) FiO2 (%):  [40 %-60 %] 40 % (09/08 0729) Last BM Date: 02/03/18  Intake/Output from previous day: 09/07 0701 - 09/08 0700 In: 303.5 [IV Piggyback:303.5] Out: 5500 [Urine:5500] Intake/Output this shift: No intake/output data recorded.  Lungs: coarse  b/l  Cardiovascular: RRR  Abd: soft, ecchymotic throughout, more yellow today  Extremities: +edema  Neuro: intubated, opens eyes to voice and motioning with purpose  Lab Results: CBC  Recent Labs    02/04/18 0427 02/05/18 0431  WBC 22.0* 15.1*  HGB 8.6* 8.5*  HCT 26.5* 27.1*  PLT 243 264   BMET Recent Labs    02/04/18 0428 02/05/18 0431  NA 143 146*  K 5.3* 4.8  CL 105 105  CO2 27 30  GLUCOSE 150* 105*  BUN 74* 80*  CREATININE 2.88* 2.42*  CALCIUM 9.0 9.0   PT/INR Recent Labs    02/03/18 0323 02/04/18 0427  LABPROT 18.9* 17.8*  INR 1.60 1.48   ABG Recent Labs    02/03/18 1938 02/05/18 0310  PHART 7.318* 7.425  HCO3 24.6 29.4*    Studies/Results:  Anti-infectives: Anti-infectives (From admission, onward)   Start     Dose/Rate Route Frequency Ordered Stop   02/02/18 0800  piperacillin-tazobactam (ZOSYN) IVPB 3.375 g     3.375 g 12.5 mL/hr over 240 Minutes Intravenous Every 8 hours 02/02/18 0300     02/01/18 2245  piperacillin-tazobactam (ZOSYN) IVPB 3.375 g     3.375 g 100 mL/hr over 30 Minutes Intravenous NOW 02/01/18 2234 02/02/18 0203      Medications: Scheduled Meds: . sodium chloride   Intravenous Once  . arformoterol  15 mcg Nebulization BID  . budesonide (PULMICORT) nebulizer solution  0.5 mg Nebulization BID  . chlorhexidine gluconate (MEDLINE KIT)  15 mL Mouth Rinse BID  . fluticasone  2 spray Each Nare Daily  . furosemide  40 mg Intravenous  Q6H  . insulin aspart  0-9 Units Subcutaneous Q4H  . mouth rinse  15 mL Mouth Rinse 10 times per day  . metoprolol tartrate  2.5 mg Intravenous Q6H  . pantoprazole (PROTONIX) IV  40 mg Intravenous Daily   Continuous Infusions: . ondansetron (ZOFRAN) IV    . piperacillin-tazobactam (ZOSYN)  IV 3.375 g (02/05/18 0746)  . propofol (DIPRIVAN) infusion 30 mcg/kg/min (02/05/18 0737)   PRN Meds:.fentaNYL (SUBLIMAZE) injection, fentaNYL (SUBLIMAZE) injection, fentaNYL (SUBLIMAZE) injection,  ipratropium-albuterol, midazolam, midazolam, ondansetron (ZOFRAN) IV **OR** ondansetron (ZOFRAN) IV  Mickeal Skinner, MD Pg# [phone] [facility]

## 2018-02-05 NOTE — Progress Notes (Signed)
[facility] Progress Note  Subjective: spont diuresis of 5L yest, creat down 2.4 today. BP's stable today.    Vitals:   02/05/18 0938 02/05/18 1000 02/05/18 1100 02/05/18 1135  BP:      Pulse: 76 80 71   Resp: 19 17 (!) 22   Temp:    (!) 97.4 F (36.3 C)  TempSrc:    Oral  SpO2: 92% 90% 93%   Weight:      Height:        Inpatient medications: . sodium chloride   Intravenous Once  . arformoterol  15 mcg Nebulization BID  . budesonide (PULMICORT) nebulizer solution  0.5 mg Nebulization BID  . chlorhexidine gluconate (MEDLINE KIT)  15 mL Mouth Rinse BID  . fluticasone  2 spray Each Nare Daily  . furosemide  40 mg Intravenous Q6H  . insulin aspart  0-9 Units Subcutaneous Q4H  . mouth rinse  15 mL Mouth Rinse 10 times per day  . metoprolol tartrate  2.5 mg Intravenous Q6H  . pantoprazole (PROTONIX) IV  40 mg Intravenous Daily   . heparin    . ondansetron (ZOFRAN) IV    . piperacillin-tazobactam (ZOSYN)  IV Stopped (02/05/18 1146)  . propofol (DIPRIVAN) infusion Stopped (02/05/18 1040)   fentaNYL (SUBLIMAZE) injection, fentaNYL (SUBLIMAZE) injection, fentaNYL (SUBLIMAZE) injection, ipratropium-albuterol, midazolam, midazolam, ondansetron (ZOFRAN) IV **OR** ondansetron (ZOFRAN) IV  Iron/TIBC/Ferritin/ %Sat No results found for: IRON, TIBC, FERRITIN, IRONPCTSAT   Home meds:  - metoprolol tartrate 25 bid  - bupropion 300 qd/ citalopram 20 qd/ tramadol 50 qid prn  - warfarin as directed  - symbicort 80-4.5 bid/ albuterol prn  - allopurinol 300 hx/ enoxaparin 150 bid/ montelukast 10 qd/ pantoprazole 40   Exam: Gen on vent, sedated Chest clear bilat RRR no MRG Abd soft ntnd no mass or ascites +bs diffuse bruising over mid and lower abdomen GU w foley draining clear yellow urine Ext chronic venous stasis bilat skin changes/ thickening w/ mild edema R> L Neuro on vent, sedated   9/5 UA > negative, UNa 79   Urine K 42  Urine creat  132   Impression: 1. Acute on CRF - due to hypotension/ hypoperfusion assoc w/ #2.  Resolving w/ excellent UOP. No new suggestions, will sign off.  2. Hemoperitoneum - w/ Phyllis Ginger, septic picture. Went to OR for washout 9/6 am. Septic picture has resolved post washout.  3. Resp failure - per CCM 4. Hyperkalemia - resolved.  5. Anemia - stable now .  6. CKD III - baseline creat 1.2- 1.5 7. Morbid obesity / recent lap gastric sleeve - done 01/24/18 8. Prosthetic AVR and MVR - on coumadin at home 9. ILD/ COPD/ OSA - on home O2   Plan - as above   Kelly Splinter MD [facility] pager [phone]   02/05/2018, 1:03 PM   Recent Labs  Lab 02/03/18 0323  02/03/18 2027 02/04/18 0427 02/04/18 0428 02/05/18 0431  NA 142   < > 146*  --  143 146*  K 6.4*   < > 5.1  --  5.3* 4.8  CL 104   < > 107  --  105 105  CO2 26   < > 26  --  27 30  GLUCOSE 172*   < > 174*  --  150* 105*  BUN 73*   < > 75*  --  74* 80*  CREATININE 3.65*   < > 3.33*  --  2.88* 2.42*  CALCIUM 9.2   < > 9.1  --  9.0 9.0  PHOS  --   --  6.0*  --  4.8* 3.7  ALBUMIN 3.5  --  2.9*  --  2.9*  --   INR 1.60  --   --  1.48  --   --    < > = values in this interval not displayed.   Recent Labs  Lab 02/01/18 2056 02/03/18 0323  AST 36 44*  ALT 26 45*  ALKPHOS 59 65  BILITOT 1.2 1.4*  PROT 6.6 7.3   Recent Labs  Lab 02/02/18 0925  02/03/18 0323  02/04/18 0427 02/05/18 0431  WBC 27.8*  --  37.4*   < > 22.0* 15.1*  NEUTROABS 22.8  --  29.7  --   --   --   HGB 7.7*   < > 8.9*   < > 8.6* 8.5*  HCT 23.2*   < > 27.5*   < > 26.5* 27.1*  MCV 93.2  --  93.5   < > 94.6 96.8  PLT 398  --  408*   < > 243 264   < > = values in this interval not displayed.

## 2018-02-05 NOTE — Progress Notes (Addendum)
[name]  KLK:[id number] DOB: Sep 27, 1960 DOA: 02/01/2018 PCP: [name], FNP    Reason for Consult / Chief Complaint:  Hypercarbic resp failure  Consulting MD:  Dr. 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  She follows with Dr. Lake [name] in the clinic. 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 No evidence of vasculitis.  Underwent aortic, mitral valve replacement in 2016, on anticoag.  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  Transferred to SDU on 12 lt O2. PCCM consulted for help with management.  Respiratory status improved with incentive spirometry Course complicated by hypotension in setting of acute blood loss anemia 8/29, acute on chronic renal failure.  She was discharged on 9/3 on anticoagulation for her mechanical valves  9/4-Readmitted with AKI, hyperkalemia, acute omental, mesenteric hematomas, hemoperitoneum with hemoglobin of 6.5, leukocytosis.  Started on antibiotics, renal consulted. Anticoagulation held and cardiology consulted. 9/6-PCCM consulted for altered mental status after she received Dilaudid.  ABG shows hypercarbia. Taken back to OR for diagnostic laparotomy, 2 L of blood evacuated.  No active bleeding noted.  No evidence of abscess or infection. 9/8- Weaning on PSV  Subjective / Interval Events  Patient examined on the vent.  On pressure support weans Open eyes to commands, mildly agitated.  Trying to mouth words.   Objective   Blood pressure (!) 109/56, pulse 99, temperature (!) 100.5 F (38.1 C), temperature source Axillary, resp. rate 19, height 5\' 5"  (1.651 m), weight (!) 154.9 kg, SpO2 93 %.     Intake/Output Summary (Last 24 hours) at 02/03/2018 1008 Last data filed at 02/03/2018 0730 Gross per 24 hour  Intake 823.27 ml  Output 1050 ml  Net -226.73 ml   Filed Weights   02/01/18 2028 02/02/18 0423 02/03/18 0332  Weight: (!) 154.2 kg (!) 153.6 kg (!) 154.9 kg   Examination:. Gen:      No acute distress HEENT:  EOMI, sclera anicteric ET tube Neck:     No masses; no thyromegaly Lungs:    Clear to auscultation bilaterally; normal respiratory effort CV:         Regular rate and rhythm; no murmurs Abd:   Abdominal ecchymosis, hematoma.  Positive bowel sounds. Ext:    No edema; adequate peripheral perfusion Skin:      Warm and dry; no rash Neuro: Sedated, arousable.  Consults: date of consult/date signed off & final recs:  Cardiology 9/5-no anticoagulation for mechanical valve due to bleeding Nephrology 9/5-monitor renal function, potassium.  No emergent need for dialysis ID 9/6-continue Zosyn  Procedures:  Significant Diagnostic Tests: Sleep study 03/04/17 - No significant obstructive sleep apnea occurred during this study (AHI = 1.3/h). - No significant central sleep apnea occurred during this study (CAI = 0.0/h). - Mild oxygen desaturation was noted during this study (Min O2 = 87.00%). - The patient snored with soft snoring volume.  Lung biopsy 10/03/2013- hemosiderin laden lung macrophages, mild chronic fibrosis, no vasculitis.  CT the abdomen pelvis 9/4-subsegmental atelectasis in the lungs, omental, mesenteric hematomas, hemoperitoneum, soft tissue induration of the abdominal pelvic wall. Chest x-ray 9/6-cardiomegaly, low lung volumes. I have reviewed the images personally.  Micro Data: Blood cultures 9/5-  Urine culture 9/5-  Antimicrobials:  Zosyn 9/5 >>  Assessment & Plan:  57 year old with history of interstitial lung disease, asthma, obesity admitted with AKI, hyperkalemia, anemia from postoperative bleed S/p ex lap with evacuation of hematoma  Acute  hypercarbic respiratory failure History of ILD, asthma, chronic hypoxic respiratory failure. Doing well on pressure support weans.  Plan on extubation today Continue Brovana, Pulmicort and duo nebs as needed.  History of hypertension, aortic, mitral valve replacement Off anticoagulation due to acute bleed.  Difficult situation as she is at high risk for development of valve thrombosis. Plan for restart heparin tonight with no bolus. Target lower PTT range. Cardiology on board. Continue Lasix, Lopressor. Good diuresis overnight.  AKI, hyperkalemia Improving creatinine, potassium Renal signed off  Fever, leukocytosis Continue Zosyn.  Diabetes SSI coverage  Best Practice / Goals of Care / Disposition.   DVT prophylaxis: SCD GI prophylaxis: Protonix Diet: NPO Mobility: Bedrest Code Status: Full Family Communication: Pt updated 9/6, sister updated at bedside 9/8  Disposition / Summary of Today's Plan 02/05/18   Extubate Restart heparin tonight with no bolus. Follow CBC, metabolic panel.  Labs   CBC: Recent Labs  Lab 02/01/18 2056  02/02/18 0925  02/03/18 0323 02/03/18 1325 02/03/18 1609 02/03/18 2027 02/04/18 0427 02/05/18 0431  WBC 24.2*   < > 27.8*  --  37.4*  --   --  23.7* 22.0* 15.1*  NEUTROABS 21.1  --  22.8  --  29.7  --   --   --   --   --   HGB 6.7*   < > 7.7*   < > 8.9* 7.9* 7.1* 8.8* 8.6* 8.5*  HCT 21.2*   < > 23.2*   < > 27.5* 23.8* 21.0* 27.0* 26.5* 27.1*  MCV 100.0   < > 93.2  --  93.5  --   --  93.4 94.6 96.8  PLT 466*   < > 398  --  408*  --   --  267 243 264   < > = values in this interval not displayed.   Basic Metabolic Panel: Recent Labs  Lab 02/02/18 0925  02/03/18 0323 02/03/18 1325 02/03/18 1609 02/03/18 2027 02/04/18 0428 02/05/18 0431  NA 142   < > 142 144 140 146* 143 146*  K 6.7*   < > 6.4* 5.4* 5.3* 5.1 5.3* 4.8  CL 106   < > 104 104  --  107 105 105  CO2 23   < > 26 26  --  26 27 30   GLUCOSE 164*   < > 172* 165*  --  174*  150* 105*  BUN 58*   < > 73* 80*  --  75* 74* 80*  CREATININE 3.02*   < > 3.65* 3.91*  --  3.33* 2.88* 2.42*  CALCIUM 9.3   < > 9.2 9.2  --  9.1 9.0 9.0  MG  --   --   --   --   --   --   --  2.4  PHOS 6.5*  --   --   --   --  6.0* 4.8* 3.7   < > = values in this interval not displayed.   GFR: Estimated Creatinine Clearance: 38.4 mL/min (A) (by C-G formula based on SCr of 2.42 mg/dL (H)). Recent Labs  Lab 02/03/18 0323 02/03/18 2027 02/04/18 0427 02/05/18 0431  WBC 37.4* 23.7* 22.0* 15.1*   Liver Function Tests: Recent Labs  Lab 02/01/18 2056 02/02/18 4650  02/03/18 0323 02/03/18 2027 02/04/18 0428  AST 36  --  44*  --   --   ALT 26  --  45*  --   --   ALKPHOS 59  --  65  --   --   BILITOT 1.2  --  1.4*  --   --   PROT 6.6  --  7.3  --   --   ALBUMIN 3.2* 3.2* 3.5 2.9* 2.9*   Recent Labs  Lab 02/01/18 2056  LIPASE 57*   No results for input(s): AMMONIA in the last 168 hours. ABG    Component Value Date/Time   PHART 7.380 02/05/2018 0903   PCO2ART 52.3 (H) 02/05/2018 0903   PO2ART 65.7 (L) 02/05/2018 0903   HCO3 30.2 (H) 02/05/2018 0903   TCO2 29 02/03/2018 1609   ACIDBASEDEF 1.1 02/03/2018 1938   O2SAT 91.1 02/05/2018 0903    Coagulation Profile: Recent Labs  Lab 01/31/18 0502 02/01/18 2121 02/02/18 0357 02/03/18 0323 02/04/18 0427  INR 1.42 1.95 2.08 1.60 1.48   Cardiac Enzymes: No results for input(s): CKTOTAL, CKMB, CKMBINDEX, TROPONINI in the last 168 hours. HbA1C: No results found for: HGBA1C CBG: Recent Labs  Lab 02/04/18 1607 02/04/18 1920 02/04/18 2336 02/05/18 0321 02/05/18 0800  GLUCAP 115* 122* 104* 105* 89     Review of Systems:   Unable to obtain as patient is on vent.  The patient is critically ill with multiple organ system failure and requires high complexity decision making for assessment and support, frequent evaluation and titration of therapies, advanced monitoring, review of radiographic studies and interpretation of  complex data.   Critical Care Time devoted to patient care services, exclusive of separately billable procedures, described in this note is 35  minutes.   Marshell Garfinkel MD [facility] 02/05/2018, 10:11 AM

## 2018-02-05 NOTE — Progress Notes (Signed)
Pharmacy Antibiotic Note  [name] is a 57 y.o. female admitted on 02/01/2018 with fever and leukocytosis likely 2/2 intra-abdominal infection. Patient underwent laparoscopic gastric sleeve resection 01/24/18. Pharmacy has been consulted for zosyn dosing.  Today, 02/05/18:  D4 abx  Tmax 100  WBC elevated, but improving  SCr improved to 2.42, CrCl ~ 38 ml/min    Plan:  Continue Zosyn 3.375g IV q8h (each dose infused over 4 hours)  Monitor renal function, cultures, clinical course, ID recommendations  Height: 5\' 5"  (165.1 cm) Weight: (!) 341 lb 4.4 oz (154.8 kg) IBW/kg (Calculated) : 57  Temp (24hrs), Avg:99.2 F (37.3 C), Min:98.8 F (37.1 C), Max:100 F (37.8 C)  Recent Labs  Lab 02/02/18 0925  02/03/18 0323 02/03/18 1325 02/03/18 2027 02/04/18 0427 02/04/18 0428 02/05/18 0431  WBC 27.8*  --  37.4*  --  23.7* 22.0*  --  15.1*  CREATININE 3.02*   < > 3.65* 3.91* 3.33*  --  2.88* 2.42*   < > = values in this interval not displayed.    Estimated Creatinine Clearance: 38.4 mL/min (A) (by C-G formula based on SCr of 2.42 mg/dL (H)).    No Known Allergies   Antimicrobials this admission: 9/4 Zosyn >>   Dose adjustments this admission: --  Microbiology results: Cx ordered after abx started  9/5 BCx: NGTD 9/5 UCx: 20K Enterococcus faecalis (pan-sensitive) 9/6 tissue cx: NGTD   Thank you for allowing pharmacy to be a part of this patient's care.   Lindell Spar, PharmD, BCPS Pager: [phone] 02/05/2018 7:42 AM
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
## 2018-02-05 NOTE — Progress Notes (Signed)
Progress Note  Patient Name: [name] Date of Encounter: 02/05/2018  Primary Cardiologist: Sinclair Grooms, MD   Subjective   The patient is intubated but communicates, denies chest pain.  Inpatient Medications    Scheduled Meds: . sodium chloride   Intravenous Once  . arformoterol  15 mcg Nebulization BID  . budesonide (PULMICORT) nebulizer solution  0.5 mg Nebulization BID  . chlorhexidine gluconate (MEDLINE KIT)  15 mL Mouth Rinse BID  . fluticasone  2 spray Each Nare Daily  . furosemide  40 mg Intravenous Q6H  . insulin aspart  0-9 Units Subcutaneous Q4H  . mouth rinse  15 mL Mouth Rinse 10 times per day  . metoprolol tartrate  2.5 mg Intravenous Q6H  . pantoprazole (PROTONIX) IV  40 mg Intravenous Daily   Continuous Infusions: . ondansetron (ZOFRAN) IV    . piperacillin-tazobactam (ZOSYN)  IV 3.375 g (02/05/18 0746)  . propofol (DIPRIVAN) infusion 30 mcg/kg/min (02/05/18 0737)   PRN Meds: fentaNYL (SUBLIMAZE) injection, fentaNYL (SUBLIMAZE) injection, fentaNYL (SUBLIMAZE) injection, ipratropium-albuterol, midazolam, midazolam, ondansetron (ZOFRAN) IV **OR** ondansetron (ZOFRAN) IV   Vital Signs    Vitals:   02/05/18 0530 02/05/18 0727 02/05/18 0729 02/05/18 0800  BP:   (!) 157/69   Pulse: 66  71   Resp: (!) 22  (!) 26   Temp:    98.7 F (37.1 C)  TempSrc:    Oral  SpO2: 95% 91% 94%   Weight:      Height:        Intake/Output Summary (Last 24 hours) at 02/05/2018 0915 Last data filed at 02/05/2018 0842 Gross per 24 hour  Intake 173.33 ml  Output 5750 ml  Net -5576.67 ml   Filed Weights   02/02/18 0423 02/03/18 0332 02/04/18 0415  Weight: (!) 153.6 kg (!) 154.9 kg (!) 154.8 kg    Telemetry    SR - Personally Reviewed  Physical Exam  Intubated GEN: No acute distress.   Neck: No JVD Cardiac: RRR, systolic and diastolic clicks, no significant murmurs, rubs, or gallops.  Respiratory: Clear to auscultation bilaterally. GI: distended with  significant ecchymoses MS: severe RLE lymphedema; No deformity. Neuro:  Nonfocal   Labs    Chemistry Recent Labs  Lab 02/01/18 2056  02/03/18 0323  02/03/18 2027 02/04/18 0428 02/05/18 0431  NA 141   < > 142   < > 146* 143 146*  K 6.6*   < > 6.4*   < > 5.1 5.3* 4.8  CL 108   < > 104   < > 107 105 105  CO2 20*   < > 26   < > 26 27 30   GLUCOSE 258*   < > 172*   < > 174* 150* 105*  BUN 47*   < > 73*   < > 75* 74* 80*  CREATININE 2.53*   < > 3.65*   < > 3.33* 2.88* 2.42*  CALCIUM 9.4   < > 9.2   < > 9.1 9.0 9.0  PROT 6.6  --  7.3  --   --   --   --   ALBUMIN 3.2*   < > 3.5  --  2.9* 2.9*  --   AST 36  --  44*  --   --   --   --   ALT 26  --  45*  --   --   --   --   ALKPHOS 59  --  65  --   --   --   --  BILITOT 1.2  --  1.4*  --   --   --   --   GFRNONAA 20*   < > 13*   < > 14* 17* 21*  GFRAA 23*   < > 15*   < > 16* 20* 24*  ANIONGAP 13   < > 12   < > 13 11 11    < > = values in this interval not displayed.     Hematology Recent Labs  Lab 02/03/18 2027 02/04/18 0427 02/05/18 0431  WBC 23.7* 22.0* 15.1*  RBC 2.89* 2.80* 2.80*  HGB 8.8* 8.6* 8.5*  HCT 27.0* 26.5* 27.1*  MCV 93.4 94.6 96.8  MCH 30.4 30.7 30.4  MCHC 32.6 32.5 31.4  RDW 16.7* 17.0* 17.3*  PLT 267 243 264    Cardiac EnzymesNo results for input(s): TROPONINI in the last 168 hours. No results for input(s): TROPIPOC in the last 168 hours.   BNPNo results for input(s): BNP, PROBNP in the last 168 hours.   DDimer No results for input(s): DDIMER in the last 168 hours.   Radiology    US Renal  Result Date: 02/03/2018 CLINICAL DATA:  Acute renal injury. EXAM: RENAL / URINARY TRACT ULTRASOUND COMPLETE COMPARISON:  CT 02/01/2018. FINDINGS: Right Kidney: Length: 10.6 cm. Echogenicity within normal limits. Perinephric stranding. No mass or hydronephrosis visualized. Left Kidney: Length: 11.6 cm. Echogenicity within normal limits. Perinephric stranding. No mass or hydronephrosis visualized. Bladder: Decompressed  with Foley catheter. Increased echogenicity incidentally noted the liver consistent fatty infiltration and/or hepatocellular disease. Ascites incidentally noted. IMPRESSION: 1. No acute focal renal abnormality. No hydronephrosis. Mild perinephric straining consistent with edema noted. 2.  Mild ascites. 3. Echogenic liver consistent fatty infiltration and/or hepatocellular disease. Electronically Signed   By: Marcello Moores  Register   On: 02/03/2018 10:45   Dg Chest Port 1 View  Result Date: 02/05/2018 CLINICAL DATA:  Acute respiratory failure. EXAM: PORTABLE CHEST 1 VIEW COMPARISON:  02/04/2018 FINDINGS: Endotracheal tube and right IJ approach central venous catheter in stable position. Moderate rotation. Stable postsurgical changes. No evidence of pneumothorax. Stable bilateral lower lobe predominant streaky airspace opacities. Probable bilateral pleural effusions. Osseous structures are without acute abnormality. Soft tissues are grossly normal. IMPRESSION: Stable bilateral streaky lower lobe predominant airspace opacities and bilateral pleural effusions. Findings may represent pulmonary edema with superimposed atelectasis or airspace consolidation and the bilateral lower lobes. Electronically Signed   By: Fidela Salisbury M.D.   On: 02/05/2018 07:14   Dg Chest Port 1 View  Result Date: 02/04/2018 CLINICAL DATA:  [place] pharyngeal secretions EXAM: PORTABLE CHEST 1 VIEW COMPARISON:  February 03, 2018 FINDINGS: The ETT terminates 2 cm above the carina, in good position. No pneumothorax. The right central line is stable. Platelike opacity in the right base is likely atelectasis. Haziness over the left hemithorax with left basilar opacity is similar, likely an effusion with underlying atelectasis. Infiltrate would be difficult to exclude on this study. The cardiomediastinal silhouette is stable. IMPRESSION: 1. Support apparatus as above. 2. Probable effusion with underlying atelectasis on the left. Atelectasis in the  right base. Electronically Signed   By: Dorise Bullion III M.D   On: 02/04/2018 07:04   Dg Chest Port 1 View  Result Date: 02/03/2018 CLINICAL DATA:  Central line placement. EXAM: PORTABLE CHEST 1 VIEW COMPARISON:  02/03/2018 FINDINGS: Right IJ approach central venous catheter terminates in the expected location of proximal superior vena cava. Endotracheal tube appropriately position. Stable postsurgical changes and valvular prosthesis noted. Cardiomediastinal silhouette  is normal for portable technique. Mediastinal contours appear intact. There is no evidence of pneumothorax. Bilateral lower lobe streaky airspace opacities. Osseous structures are without acute abnormality. Soft tissues are grossly normal. IMPRESSION: Post intubation and central line placement. No evidence of pneumothorax. Persistent bilateral lower lobe predominant streaky airspace opacities. Electronically Signed   By: Fidela Salisbury M.D.   On: 02/03/2018 19:19    Cardiac Studies   TTE: 02/04/2018  - Left ventricle: The cavity size was normal. Wall thickness was   normal. Systolic function was vigorous. The estimated ejection   fraction was in the range of 65% to 70%. Wall motion was normal;   there were no regional wall motion abnormalities. Doppler   parameters are consistent with high ventricular filling pressure. - Aortic valve: A mechanical prosthesis was present. There was mild   regurgitation. - Mitral valve: A mechanical prosthesis was present. - Left atrium: The atrium was moderately dilated. - Pulmonic valve: Peak gradient (S): 12 mm Hg. - Pulmonary arteries: Systolic pressure was moderately increased.   PA peak pressure: 50 mm Hg (S).  Impressions:  - Technically difficult; vigorous LV systolic function; elevated LV   filling pressure; s/p AVR with elevated mean gradient of 31 mmHg   and moderate AI; s/p MVR with elevated mean gradient 11 mmHg;   moderate LAE; moderate pulmonary hypertension; suggest  TEE to   further assess.    Patient Profile     57 y.o. female  with a history of severe aortic stenosis and moderate mitral stenosis status post [employer] AVR and MVR for rheumatic heart disease.  She also has a history of postoperative atrial flutter with no recurrence, morbid obesity, diastolic CHF, chronic kidney disease stage III and hypertension, s/p elective laparoscopic sleeve gastrectomy on 01/24/2018,  Readmitted for an acute omental and possible mesenteric hematomas with, significant Hb drop to 6.5.  Assessment & Plan     S/P DIAGNOSTIC LAPAROSCOPY EVACUATION OF HEMATOMA 02/03/2018 with Removal of 2 liters of blood on 9/6, no definable bleeding source, no evidence of leak or infection. WBC and Cr improved some, high O2 requirements overnight, vent per PCCM, on antibiotics, no anticoagulation today due to continued anemia requiring transfusion. Hgb 8.8->8.6->8.5, surgery ok to restart Heparin with no bolus at 6 pm tonight.  Echo yesterday showed increased gradients across both prosthetic valves mean transaortic 31 mmHg from prior 25 mmHg, mean trans mitral 11 mmHg from prior 8 mmHg. TEE was recommended. This could possibly be explained by prosthesis thrombosis but also by increased gradient with fever.   Great diuresis overnight, I would continue lasix 40 mg iv Q6H. Crea is improving.   For questions or updates, please contact [place] Please consult [email or web address] for contact info under        Signed, Ena Dawley, MD  02/05/2018, 9:15 AM

## 2018-02-05 NOTE — Procedures (Signed)
Extubation Procedure Note  Patient Details:   Name: [name] DOB: [birth year] MRN: [id number]   Airway Documentation:  Airway 7.5 mm (Active)  Secured at (cm) 24 cm 02/05/2018  7:29 AM  Measured From Lips 02/05/2018  7:29 AM  Secured Location Left 02/05/2018  7:27 AM  Secured By [employer] 02/05/2018  7:29 AM  Tube Holder Repositioned Yes 02/05/2018  7:27 AM  Cuff Pressure (cm H2O) 28 cm H2O 02/05/2018  7:27 AM  Site Condition Dry 02/05/2018  7:29 AM   Vent end date: (not recorded) Vent end time: (not recorded)   Evaluation  O2 sats: stable throughout Complications: No apparent complications Patient did tolerate procedure well. Bilateral Breath Sounds: Clear   Yes  Elsie Stain 02/05/2018, 10:47 AM

## 2018-02-06 ENCOUNTER — Inpatient Hospital Stay (HOSPITAL_COMMUNITY): Payer: Medicare Other

## 2018-02-06 ENCOUNTER — Encounter (HOSPITAL_COMMUNITY): Payer: Self-pay | Admitting: General Surgery

## 2018-02-06 DIAGNOSIS — R109 Unspecified abdominal pain: Secondary | ICD-10-CM

## 2018-02-06 DIAGNOSIS — R58 Hemorrhage, not elsewhere classified: Secondary | ICD-10-CM

## 2018-02-06 LAB — BASIC METABOLIC PANEL
ANION GAP: 11 (ref 5–15)
Anion gap: 13 (ref 5–15)
BUN: 80 mg/dL — AB (ref 6–20)
BUN: 67 mg/dL — ABNORMAL HIGH (ref 6–20)
CALCIUM: 9 mg/dL (ref 8.9–10.3)
CHLORIDE: 97 mmol/L — AB (ref 98–111)
CO2: 35 mmol/L — ABNORMAL HIGH (ref 22–32)
CO2: 37 mmol/L — ABNORMAL HIGH (ref 22–32)
Calcium: 9 mg/dL (ref 8.9–10.3)
Chloride: 101 mmol/L (ref 98–111)
Creatinine, Ser: 1.97 mg/dL — ABNORMAL HIGH (ref 0.44–1.00)
Creatinine, Ser: 1.77 mg/dL — ABNORMAL HIGH (ref 0.44–1.00)
GFR calc Af Amer: 31 mL/min — ABNORMAL LOW (ref >60)
GFR calc non Af Amer: 31 mL/min — ABNORMAL LOW (ref >60)
GFR, EST AFRICAN AMERICAN: 35 mL/min — AB (ref >60)
GFR, EST NON AFRICAN AMERICAN: 27 mL/min — AB (ref >60)
GLUCOSE: 88 mg/dL (ref 70–99)
Glucose, Bld: 89 mg/dL (ref 70–99)
POTASSIUM: 3.9 mmol/L (ref 3.5–5.1)
Potassium: 4.2 mmol/L (ref 3.5–5.1)
SODIUM: 147 mmol/L — AB (ref 135–145)
Sodium: 147 mmol/L — ABNORMAL HIGH (ref 135–145)

## 2018-02-06 LAB — MAGNESIUM: MAGNESIUM: 2.3 mg/dL (ref 1.7–2.4)

## 2018-02-06 LAB — GLUCOSE, CAPILLARY
GLUCOSE-CAPILLARY: 85 mg/dL (ref 70–99)
GLUCOSE-CAPILLARY: 85 mg/dL (ref 70–99)
GLUCOSE-CAPILLARY: 81 mg/dL (ref 70–99)
Glucose-Capillary: 78 mg/dL (ref 70–99)
Glucose-Capillary: 87 mg/dL (ref 70–99)
Glucose-Capillary: 91 mg/dL (ref 70–99)
Glucose-Capillary: 98 mg/dL (ref 70–99)

## 2018-02-06 LAB — CBC
HCT: 28.3 % — ABNORMAL LOW (ref 36.0–46.0)
Hemoglobin: 8.7 g/dL — ABNORMAL LOW (ref 12.0–15.0)
MCH: 30.2 pg (ref 26.0–34.0)
MCHC: 30.7 g/dL (ref 30.0–36.0)
MCV: 98.3 fL (ref 78.0–100.0)
PLATELETS: 261 10*3/uL (ref 150–400)
RBC: 2.88 MIL/uL — ABNORMAL LOW (ref 3.87–5.11)
RDW: 16.7 % — AB (ref 11.5–15.5)
WBC: 12.2 10*3/uL — ABNORMAL HIGH (ref 4.0–10.5)

## 2018-02-06 LAB — PHOSPHORUS: Phosphorus: 4.8 mg/dL — ABNORMAL HIGH (ref 2.5–4.6)

## 2018-02-06 LAB — HEPARIN LEVEL (UNFRACTIONATED)
HEPARIN UNFRACTIONATED: 0.48 [IU]/mL (ref 0.30–0.70)
Heparin Unfractionated: 0.59 IU/mL (ref 0.30–0.70)
Heparin Unfractionated: 0.54 IU/mL (ref 0.30–0.70)

## 2018-02-06 MED ORDER — HEPARIN (PORCINE) IN NACL 100-0.45 UNIT/ML-% IJ SOLN
1600.0000 [IU]/h | INTRAMUSCULAR | Status: DC
  Administered 2018-02-06: 1700 [IU]/h via INTRAVENOUS
  Administered 2018-02-07: 1600 [IU]/h via INTRAVENOUS
  Filled 2018-02-06 (×2): qty 250

## 2018-02-06 MED ORDER — WARFARIN - PHARMACIST DOSING INPATIENT: Freq: Every day | Status: DC

## 2018-02-06 MED ORDER — WARFARIN SODIUM 5 MG PO TABS
10.0000 mg | ORAL_TABLET | Freq: Once | ORAL | Status: AC
  Administered 2018-02-06: 10 mg via ORAL
  Filled 2018-02-06: qty 2

## 2018-02-06 MED ORDER — METOPROLOL TARTRATE 25 MG PO TABS
25.0000 mg | ORAL_TABLET | Freq: Two times a day (BID) | ORAL | Status: DC
  Administered 2018-02-06 – 2018-02-11 (×11): 25 mg via ORAL
  Filled 2018-02-06 (×12): qty 1

## 2018-02-06 MED ORDER — ORAL CARE MOUTH RINSE
15.0000 mL | Freq: Two times a day (BID) | OROMUCOSAL | Status: DC
  Administered 2018-02-06 – 2018-02-11 (×10): 15 mL via OROMUCOSAL

## 2018-02-06 MED ORDER — ACETAMINOPHEN 325 MG PO TABS
650.0000 mg | ORAL_TABLET | Freq: Four times a day (QID) | ORAL | Status: DC
  Administered 2018-02-06 – 2018-02-12 (×24): 650 mg via ORAL
  Filled 2018-02-06 (×24): qty 2

## 2018-02-06 MED ORDER — HEPARIN (PORCINE) IN NACL 100-0.45 UNIT/ML-% IJ SOLN
1800.0000 [IU]/h | INTRAMUSCULAR | Status: DC
  Filled 2018-02-06: qty 250

## 2018-02-06 NOTE — Care Management (Signed)
Discharge Criteria Return to top of Respiratory Failure GRG - GRG [Expand All / Collapse All]  Continued inpatient stay is needed until 1 or more of the following are present: ? Acceptable patient status for next level of care is achieved. ? ALL of the following are present:  Weaning assessment performed   Weaning trials attempted yes    Ventilation status appropriate extubated on [id number]    Airway status acceptable yes    Respiratory status acceptable/o2 at 4l/min    Stable chest findings-yes    No chest tube, or status acceptable-n/a   Neurologic status acceptable    yes   Temperature status acceptable  yes    No infection, or status acceptable  Wbc-12.2   Activity level acceptable  Bed rest    Intake acceptable iv flds at 125cc/hrs,npo v tylenol, iv heparin, iv zosyn, iv zofran,   BUN-80 and CReat.=1.97 Meets inpatient

## 2018-02-06 NOTE — Progress Notes (Signed)
Patient working with PT, up to [facility] with assist.  No current questions.  Discussed diet with bedside RN.  Contact numbers provided for staff/patient.  Will continue to follow.  [facility] Bariatric Nurse Coordinator [phone]

## 2018-02-06 NOTE — Evaluation (Signed)
Physical Therapy Evaluation Patient Details Name: [name] MRN: [id number] DOB: 12-18-1960 Today's Date: 02/06/2018   History of Present Illness  57 yo female  S/P gastric sleeve and hernia repair on 01/24/18. [place] 9/3 and returned 9/4 with diarrhea, abdominal pain, found to have Significant intraperitoneal and abdominal wall hematomas. Post  laparoscopy for diagnostic and  aspiration-evacuation of hematoma, washout on 9/6. Extubated 02/05/18. patient has significant history prosthetic MVR and AVR, CKD, OSA.  Clinical Impression  The patient mobilized to recliner with 2 assist , on 8 liters HFNC. Patient should progress to return home with family. Pt admitted with above diagnosis. Pt currently with functional limitations due to the deficits listed below (see PT Problem List). Pt will benefit from skilled PT to increase their independence and safety with mobility to allow discharge to the venue listed below.       Follow Up Recommendations Home health PT- patient declines SNF    Equipment Recommendations  None recommended by PT    Recommendations for Other Services OT consult     Precautions / Restrictions Precautions Precautions: Fall Precaution Comments: chronic 3L O2 [place]      Mobility  Bed Mobility Overal bed mobility: Needs Assistance Bed Mobility: Sidelying to Sit;Rolling Rolling: Mod assist Sidelying to sit: Mod assist          Transfers Overall transfer level: Needs assistance Equipment used: Rolling walker (2 wheeled) Transfers: Sit to/from [facility] Sit to Stand: Mod assist;+2 physical assistance;+2 safety/equipment Stand pivot transfers: Mod assist;+2 physical assistance;+2 safety/equipment       General transfer comment: Legs are weak and slightly buckling. Increased time. Patient on 8 Liters HFNC, Sats 100%, HR 80's.  Ambulation/Gait                Stairs            Wheelchair Mobility    Modified Rankin (Stroke  Patients Only)       Balance                                             Pertinent Vitals/Pain Faces Pain Scale: Hurts a little bit Pain Location: abdomen  Pain Descriptors / Indicators: Sore    Home Living Family/patient expects to be discharged to:: Private residence Living Arrangements: Other relatives Available Help at Discharge: Family Type of Home: Apartment Home Access: Level entry     Home Layout: One level   Additional Comments: chronic oxygen    Prior Function Level of Independence: Independent         Comments: uses 3 liters     Hand Dominance        Extremity/Trunk Assessment   Upper Extremity Assessment Upper Extremity Assessment: Generalized weakness    Lower Extremity Assessment Lower Extremity Assessment: Generalized weakness    Cervical / Trunk Assessment Cervical / Trunk Assessment: Normal  Communication   Communication: No difficulties  Cognition Arousal/Alertness: Awake/alert Behavior During Therapy: WFL for tasks assessed/performed Overall Cognitive Status: Within Functional Limits for tasks assessed                                        General Comments      Exercises     Assessment/Plan    PT Assessment Patient needs continued  PT services  PT Problem List Decreased strength;Decreased mobility;Decreased activity tolerance;Decreased knowledge of use of DME;Cardiopulmonary status limiting activity       PT Treatment Interventions DME instruction;Therapeutic activities;Gait training;Therapeutic exercise;Patient/family education;Functional mobility training    PT Goals (Current goals can be found in the Care Plan section)  Acute Rehab PT Goals Patient Stated Goal: home PT Goal Formulation: With patient Time For Goal Achievement: 02/20/18 Potential to Achieve Goals: Good    Frequency Min 3X/week   Barriers to discharge        Co-evaluation               AM-PAC PT "6  Clicks" Daily Activity  Outcome Measure Difficulty turning over in bed (including adjusting bedclothes, sheets and blankets)?: Unable Difficulty moving from lying on back to sitting on the side of the bed? : Unable Difficulty sitting down on and standing up from a chair with arms (e.g., wheelchair, bedside commode, etc,.)?: Unable Help needed moving to and from a bed to chair (including a wheelchair)?: Total Help needed walking in hospital room?: Total Help needed climbing 3-5 steps with a railing? : Total 6 Click Score: 6    End of Session Equipment Utilized During Treatment: Oxygen Activity Tolerance: Patient limited by fatigue Patient left: in chair;with call bell/phone within reach Nurse Communication: Mobility status PT Visit Diagnosis: Difficulty in walking, not elsewhere classified (R26.2);Muscle weakness (generalized) (M62.81)    Time: 5953-9672 PT Time Calculation (min) (ACUTE ONLY): 27 min   Charges:   PT Evaluation $PT Eval Low Complexity: 1 Low PT Treatments $Therapeutic Activity: 8-22 mins       [place] PT [phone]   Claretha Cooper 02/06/2018, 1:31 PM

## 2018-02-06 NOTE — Progress Notes (Signed)
Patient ID: [name], female   DOB: 01-09-1961, 57 y.o.   MRN: [id number]   Progress Note: Metabolic and Bariatric Surgery Service   Chief Complaint/Subjective: Extubated yesterday am No n/v with clears yesterday Feel a lot better - much less pain No fever.  Started hep gtt yesterday around 3pm - no bleeding so far  SEE QUESTIONS FOR CARDS BELOW at end of note Objective: Vital signs in last 24 hours: Temp:  [97.4 F (36.3 C)-98.5 F (36.9 C)] 97.9 F (36.6 C) (09/09 0700) Pulse Rate:  [59-82] 62 (09/09 0800) Resp:  [11-24] 11 (09/09 0800) BP: (130-144)/(59-69) 144/69 (09/09 0800) SpO2:  [90 %-99 %] 96 % (09/09 0800) Arterial Line BP: (133-162)/(58-72) 157/69 (09/08 2230) Weight:  [143.8 kg] 143.8 kg (09/09 0500) Last BM Date: 02/03/18  Intake/Output from previous day: 09/08 0701 - 09/09 0700 In: 72.7 [I.V.:20.7; IV Piggyback:52] Out: 7829 [Urine:7250] Intake/Output this shift: No intake/output data recorded.  Lungs: cta  Cardiovascular: +click  Abd:  Obese, softer, bruising, incisions ok  Extremities: LLE - no edema, +SCDs  Neuro: nonfocal, alert,   Lab Results: CBC  Recent Labs    02/05/18 0431 02/06/18 0345  WBC 15.1* 12.2*  HGB 8.5* 8.7*  HCT 27.1* 28.3*  PLT 264 261   BMET Recent Labs    02/05/18 0431 02/06/18 0345  NA 146* 147*  K 4.8 4.2  CL 105 101  CO2 30 35*  GLUCOSE 105* 88  BUN 80* 80*  CREATININE 2.42* 1.97*  CALCIUM 9.0 9.0   PT/INR Recent Labs    02/04/18 0427  LABPROT 17.8*  INR 1.48   ABG Recent Labs    02/05/18 0310 02/05/18 0903  PHART 7.425 7.380  HCO3 29.4* 30.2*    Studies/Results:  Anti-infectives: Anti-infectives (From admission, onward)   Start     Dose/Rate Route Frequency Ordered Stop   02/02/18 0800  piperacillin-tazobactam (ZOSYN) IVPB 3.375 g     3.375 g 12.5 mL/hr over 240 Minutes Intravenous Every 8 hours 02/02/18 0300     02/01/18 2245  piperacillin-tazobactam (ZOSYN) IVPB 3.375 g     3.375 g 100 mL/hr over 30 Minutes Intravenous NOW 02/01/18 2234 02/02/18 0203      Medications: Scheduled Meds: . sodium chloride   Intravenous Once  . acetaminophen  650 mg Oral Q6H  . arformoterol  15 mcg Nebulization BID  . budesonide (PULMICORT) nebulizer solution  0.5 mg Nebulization BID  . chlorhexidine gluconate (MEDLINE KIT)  15 mL Mouth Rinse BID  . fluticasone  2 spray Each Nare Daily  . furosemide  40 mg Intravenous Q6H  . insulin aspart  0-9 Units Subcutaneous Q4H  . mouth rinse  15 mL Mouth Rinse 10 times per day  . metoprolol tartrate  2.5 mg Intravenous Q6H  . pantoprazole (PROTONIX) IV  40 mg Intravenous Daily  . protein supplement shake  6 oz Oral QID   Continuous Infusions: . heparin 1,900 Units/hr (02/06/18 0411)  . ondansetron (ZOFRAN) IV    . piperacillin-tazobactam (ZOSYN)  IV 3.375 g (02/06/18 0839)   PRN Meds:.ipratropium-albuterol, ondansetron (ZOFRAN) IV **OR** ondansetron (ZOFRAN) IV  Assessment/Plan: Patient Active Problem List   Diagnosis Date Noted  . Contraindication to anticoagulation therapy   . Acute diastolic CHF (congestive heart failure), NYHA class 4 ([place])   . Acute on chronic respiratory failure with hypoxia and hypercapnia ([place]) 02/03/2018  . Acute metabolic encephalopathy 56/21/3086  . Fever 02/03/2018  . Leukocytosis 02/03/2018  . Abdominal pain 02/02/2018  .  Hyperkalemia 02/02/2018  . Acute kidney injury superimposed on CKD ([place]) 02/02/2018  . Mesenteric hematoma 02/02/2018  . Hemoperitoneum 02/02/2018  . Anemia   . 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 02/01/2018  . Postoperative anemia due to chronic blood loss on full anticoagulation 02/01/2018  . AKI (acute kidney injury) ([place]) 02/01/2018  . Acute renal failure superimposed on stage 3 chronic kidney disease ([place]) 01/26/2018  . PAF (paroxysmal atrial fibrillation) ([place])   . Acute respiratory failure ([place])   . Hypoxia   . Postprocedural hypotension   . Chronic acquired  lymphedema - Right lower extremity 01/24/2018  . Hx of mechanical aortic valve replacement 01/24/2018  . Obstructive sleep apnea 02/03/2017  . Chronic diastolic heart failure ([place]) 01/11/2017  . Morbid obesity with BMI of 50.0-59.9, adult ([place]) 02/11/2016  . HX: long term anticoagulant use 02/11/2016  . Chronic respiratory failure ([place]) 08/12/2015  . 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 Allergic rhinitis 05/02/2015  . Symptomatic anemia 04/08/2015  . History of mitral valve replacement with mechanical valve 04/08/2015  . CKD (chronic kidney disease) stage 3, GFR 30-59 ml/min ([facility]) 04/08/2015  . Atrial flutter, unspecified   . Chronic anticoagulation 09/04/2014  . Warfarin anticoagulation 08/14/2014  . ILD (interstitial lung disease) ([place]) 10/12/2013  . Aortic stenosis 10/02/2013  . Hyperlipidemia 10/02/2013  . Chronic asthmatic bronchitis ([place]) 08/06/2013  . Gout 04/16/2011  . Essential hypertension 10/07/2010  . Depressive disorder, not elsewhere classified 10/07/2010   s/p Procedure(s): S/p lap sleeve gastrectomy with hiatal hernia repair 01/24/18 Readmitted with AKI on CKD, ABL anemia DIAGNOSTIC LAPAROSCOPY EVACUATION OF HEMATOMA 02/03/2018  Doing well.  -GI - adv to bari fulls  -FEN - bari fulls, HLIV, lytes ok  -Renal - aki on ckd, good diuresis, Cr trending down, renal signed off; can we stop the scheduled IV lasix, go back to home lasix dose?  - CV - BP climibing, will ask cards which home BP agent to restart?  -Mechanical valves - defer repeat echo to cards; started hep gtt yesterday, no signs of bleeding, start coumadin today per pharmacy  -ID - no fever, wbc trending down. All cultures negative to date, ID felt wbc/fever could have been from hemoperitoneum.  Probably stop zosyn today  -Endo - blood sugars ok  -pt eval, OOB to chair today  -pulm - cont home nebs  Disposition: CARDS - can we stop q6 lasix, what home BP med to restart?  ID - can we stop zosyn? tx to SDU  today. If does well overnight, will tx to telemetry Tuesday   LOS: 4 days    Greer Pickerel, MD [phone] [facility]
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
## 2018-02-06 NOTE — Progress Notes (Addendum)
[name]  BHA:[id number] DOB: 1960/11/19 DOA: 02/01/2018 PCP: [name], FNP    Reason for Consult / Chief Complaint:  Hypercarbic resp failure  Consulting MD:  Dr. 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  She follows with Dr. Lake [name] in the clinic. 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 No evidence of vasculitis.  Underwent aortic, mitral valve replacement in 2016, on anticoag.   Significant Diagnostic Tests: Sleep study 03/04/17 - No significant obstructive sleep apnea occurred during this study (AHI = 1.3/h). - No significant central sleep apnea occurred during this study (CAI = 0.0/h). - Mild oxygen desaturation was noted during this study (Min O2 = 87.00%). - The patient snored with soft snoring volume.  Lung biopsy 10/03/2013- hemosiderin laden lung macrophages, mild chronic fibrosis, no vasculitis.  ................................................... 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  Transferred to SDU on 12 lt O2. PCCM consulted for help with management.  Respiratory status improved with incentive spirometry Course complicated by hypotension in setting of acute blood loss anemia 8/29, acute on chronic renal failure.  She was discharged on 9/3 on anticoagulation for her mechanical valves  9/4-Readmitted with AKI, hyperkalemia, acute omental, mesenteric hematomas, hemoperitoneum with hemoglobin of 6.5, leukocytosis.  Started on antibiotics, renal consulted. Anticoagulation held and cardiology consulted.   CT the abdomen pelvis 9/4-subsegmental atelectasis in the lungs, omental, mesenteric hematomas,  hemoperitoneum, soft tissue induration of the abdominal pelvic wall.   Cardiology 9/5-no anticoagulation for mechanical valve due to bleeding Nephrology 9/5-monitor renal function, potassium.  No emergent need for dialysis ID 9/6-continue Zosyn   CT the abdomen pelvis 9/4-subsegmental atelectasis in the lungs, omental, mesenteric hematomas, hemoperitoneum, soft tissue induration of the abdominal pelvic wall. Chest x-ray 9/6-cardiomegaly, low lung volumes. I have reviewed the images personally.    9/6-PCCM consulted for altered mental status after she received Dilaudid.  ABG shows hypercarbia. Taken back to OR for diagnostic laparotomy, 2 L of blood evacuated.  No active bleeding noted.  No evidence of abscess or infection.  9/8- Weaning on PSV. Patient examined on the vent.  On pressure support weans Open eyes to commands, mildly agitated.  Trying to mouth words.    Subjective / Interval Events   02/06/2018 - reamins extubated. Denies OSA and need for CPAP. BP/HR stable. Making  Urine. No delirium. Feels good. No visible bleeding. Hgb stable   Objective   Vitals:   02/06/18 0400 02/06/18 0500 02/06/18 0600 02/06/18 0700  BP: 139/62  (!) 130/59   Pulse: 62  (!) 59   Resp: 18  18   Temp: 98.2 F (36.8 C)   97.9 F (36.6 C)  TempSrc: Oral   Oral  SpO2: 97%  95%   Weight:  (!) 143.8 kg    Height:        General Appearance:  Looks better. Obese +. Head:  Normocephalic, without obvious abnormality, atraumatic Eyes:  PERRL - yes, conjunctiva/corneas - clear     Ears:  Normal external ear canals, both ears Nose:  G tube - no bu thas nasal cannula o2 Throat:  ETT TUBE - no , OG tube - no Neck:  Supple,  No  enlargement/tenderness/nodules Lungs: Clear to auscultation bilaterally,  Heart:  S1 and S2 normal, no murmur, CVP - no.  Pressors - no Abdomen:  Soft, no masses, no organomegaly Genitalia / Rectal:  Not done Extremities:  Extremities- itnact Skin:  ntact in exposed areas  . Neurologic:  Sedation - none -> RASS - +1 . Moves all 4s - yes. CAM-ICU - neg . Orientation - x3+      LABS    Procedures:  Micro Data: Blood cultures 9/5- Urine culture 9/5-  Antimicrobials:  Zosyn 9/5 >>   PULMONARY Recent Labs  Lab 02/01/18 2102 02/03/18 0800 02/03/18 1609 02/03/18 1938 02/05/18 0310 02/05/18 0903  PHART  --  7.236* 7.325* 7.318* 7.425 7.380  PCO2ART  --  64.2* 53.3* 49.3* 45.7 52.3*  PO2ART  --  68.5* 225.0* 84.7 77.2* 65.7*  HCO3  --  26.3 27.8 24.6 29.4* 30.2*  TCO2 19*  --  29  --   --   --   O2SAT  --  90.6 100.0 96.3 95.2 91.1    CBC Recent Labs  Lab 02/04/18 0427 02/05/18 0431 02/06/18 0345  HGB 8.6* 8.5* 8.7*  HCT 26.5* 27.1* 28.3*  WBC 22.0* 15.1* 12.2*  PLT 243 264 261    COAGULATION Recent Labs  Lab 01/31/18 0502 02/01/18 2121 02/02/18 0357 02/03/18 0323 02/04/18 0427  INR 1.42 1.95 2.08 1.60 1.48    CARDIAC  No results for input(s): TROPONINI in the last 168 hours. No results for input(s): PROBNP in the last 168 hours.   CHEMISTRY Recent Labs  Lab 02/02/18 0925  02/03/18 1325 02/03/18 1609 02/03/18 2027 02/04/18 0428 02/05/18 0431 02/06/18 0345  NA 142   < > 144 140 146* 143 146* 147*  K 6.7*   < > 5.4* 5.3* 5.1 5.3* 4.8 4.2  CL 106   < > 104  --  107 105 105 101  CO2 23   < > 26  --  26 27 30  35*  GLUCOSE 164*   < > 165*  --  174* 150* 105* 88  BUN 58*   < > 80*  --  75* 74* 80* 80*  CREATININE 3.02*   < > 3.91*  --  3.33* 2.88* 2.42* 1.97*  CALCIUM 9.3   < > 9.2  --  9.1 9.0 9.0 9.0  MG  --   --   --   --   --   --  2.4 2.3  PHOS 6.5*  --   --   --  6.0* 4.8* 3.7 4.8*   < > = values in this interval not displayed.   Estimated Creatinine Clearance: 45.1 mL/min (A) (by C-G formula based on SCr of 1.97 mg/dL (H)).   LIVER Recent Labs  Lab 01/31/18 0502 02/01/18 2056 02/01/18 2121 02/02/18 0357 02/02/18 0925 02/03/18 0323 02/03/18 2027 02/04/18 0427 02/04/18 0428  AST  --  36  --   --    --  44*  --   --   --   ALT  --  26  --   --   --  45*  --   --   --   ALKPHOS  --  59  --   --   --  65  --   --   --   BILITOT  --  1.2  --   --   --  1.4*  --   --   --   PROT  --  6.6  --   --   --  7.3  --   --   --   ALBUMIN  --  3.2*  --   --  3.2* 3.5 2.9*  --  2.9*  INR 1.42  --  1.95 2.08  --  1.60  --  1.48  --      INFECTIOUS No results for input(s): LATICACIDVEN, PROCALCITON in the last 168 hours.   ENDOCRINE CBG (last 3)  Recent Labs    02/06/18 0002 02/06/18 0400 02/06/18 0743  GLUCAP 98 78 85         IMAGING x48h  - image(s) personally visualized  -   highlighted in bold Dg Chest Port 1 View  Result Date: 02/06/2018 CLINICAL DATA:  Acute respiratory failure. EXAM: PORTABLE CHEST 1 VIEW COMPARISON:  02/05/2018 FINDINGS: Post extubation. Postsurgical changes and right IJ approach central venous catheter, stable. Mildly enlarged cardiac silhouette. There is no evidence of focal pneumothorax. Low lung volumes with bilateral lower lobe streaky airspace opacities. Osseous structures are without acute abnormality. Soft tissues are grossly normal. IMPRESSION: Low lung volumes with bilateral lower lobe streaky airspace opacities may represent atelectasis versus airspace consolidation. Small bilateral subpulmonic effusions cannot be excluded. Electronically Signed   By: Fidela Salisbury M.D.   On: 02/06/2018 07:43   Dg Chest Port 1 View  Result Date: 02/05/2018 CLINICAL DATA:  Acute respiratory failure. EXAM: PORTABLE CHEST 1 VIEW COMPARISON:  02/04/2018 FINDINGS: Endotracheal tube and right IJ approach central venous catheter in stable position. Moderate rotation. Stable postsurgical changes. No evidence of pneumothorax. Stable bilateral lower lobe predominant streaky airspace opacities. Probable bilateral pleural effusions. Osseous structures are without acute abnormality. Soft tissues are grossly normal. IMPRESSION: Stable bilateral streaky lower lobe predominant  airspace opacities and bilateral pleural effusions. Findings may represent pulmonary edema with superimposed atelectasis or airspace consolidation and the bilateral lower lobes. Electronically Signed   By: Fidela Salisbury M.D.   On: 02/05/2018 07:14      Assessment & Plan:  57 year old with history of interstitial lung disease, asthma, obesity admitted with AKI, hyperkalemia, anemia from postoperative bleed S/p ex lap with evacuation of hematoma  Acute hypercarbic respiratory failure History of ILD, asthma, chronic hypoxic respiratory failure.  S/p extubation 02/05/18. Doing well on [place] o2 on 02/06/2018 Continue Brovana, Pulmicort and duo nebs as needed.  History of hypertension, aortic, mitral valve replacement Off anticoagulation due to acute bleed.  Difficult situation as she is at high risk for development of valve thrombosis.  02/06/2018 - back on IV heparin gtt x 24h. No visible bleeding curently Target lower PTT range. Cardiology on board. Continue Lasix, Lopressor. Good diuresis overnight.  AKI, hyperkalemia Improving creatinine, potassium Renal signed off  Fever, leukocytosis Continue Zosyn.  Diabetes SSI coverage  Best Practice / Goals of Care / Disposition.   DVT prophylaxis: SCD GI prophylaxis: Protonix Diet: NPO Mobility: Bedrest Code Status: Full Family Communication: Pt updated 9/6, sister updated at bedside 9/8 and patient 02/06/18  Disposition / Summary of [place] 02/06/18    Move to tele 02/06/2018 and CCM off with [facility] primary from 02/07/18 d/w Dr [name] of [place].      SIGNATURE    Dr. Brand Males, M.D., F.C.C.P,  Pulmonary and Critical Care Medicine Staff Physician, [place] Director - Interstitial Lung Disease  Program  Pulmonary [place] [facility] [place], [place], [address]  Pager: [phone], If no answer or between  15:00h - 7:00h: call [phone] Telephone:  [phone]  8:52 AM 02/06/2018
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
## 2018-02-06 NOTE — Progress Notes (Addendum)
Progress Note  Patient Name: [name] Date of Encounter: 02/06/2018  Primary Cardiologist: Sinclair Grooms, MD   Subjective   Pt extubated. No complaints. Doing well this AM. No signs of active bleeding   Inpatient Medications    Scheduled Meds: . sodium chloride   Intravenous Once  . arformoterol  15 mcg Nebulization BID  . budesonide (PULMICORT) nebulizer solution  0.5 mg Nebulization BID  . chlorhexidine gluconate (MEDLINE KIT)  15 mL Mouth Rinse BID  . fluticasone  2 spray Each Nare Daily  . furosemide  40 mg Intravenous Q6H  . insulin aspart  0-9 Units Subcutaneous Q4H  . mouth rinse  15 mL Mouth Rinse 10 times per day  . metoprolol tartrate  2.5 mg Intravenous Q6H  . pantoprazole (PROTONIX) IV  40 mg Intravenous Daily  . protein supplement shake  6 oz Oral QID   Continuous Infusions: . acetaminophen Stopped (02/06/18 0543)  . heparin 1,900 Units/hr (02/06/18 0411)  . ondansetron (ZOFRAN) IV    . piperacillin-tazobactam (ZOSYN)  IV Stopped (02/06/18 0438)   PRN Meds: HYDROmorphone (DILAUDID) injection, ipratropium-albuterol, ondansetron (ZOFRAN) IV **OR** ondansetron (ZOFRAN) IV, traMADol   Vital Signs    Vitals:   02/06/18 0400 02/06/18 0500 02/06/18 0600 02/06/18 0700  BP: 139/62  (!) 130/59   Pulse: 62  (!) 59   Resp: 18  18   Temp: 98.2 F (36.8 C)   97.9 F (36.6 C)  TempSrc: Oral   Oral  SpO2: 97%  95%   Weight:  (!) 143.8 kg    Height:        Intake/Output Summary (Last 24 hours) at 02/06/2018 0735 Last data filed at 02/06/2018 0600 Gross per 24 hour  Intake 72.74 ml  Output 7250 ml  Net -7177.26 ml   Filed Weights   02/03/18 0332 02/04/18 0415 02/06/18 0500  Weight: (!) 154.9 kg (!) 154.8 kg (!) 143.8 kg   Physical Exam   General: Obese, NAD Skin: Warm, dry, intact  Head: Normocephalic, atraumatic, clear, moist mucus membranes. Neck: Negative for carotid bruits. No JVD Lungs:Clear to ausculation bilaterally. No wheezes,  rales, or rhonchi. Breathing is unlabored. Cardiovascular: RRR with S1 S2. No murmurs, rubs, gallops, or LV heave appreciated. Abdomen: Firm, distended, tender, hypoactive bowel sounds.  MSK: Strength and tone appear normal for age. 5/5 in all extremities Extremities: R>L BLE edema. No clubbing or cyanosis. DP/PT pulses 1+ bilaterally Neuro: Alert and oriented. No focal deficits. No facial asymmetry. MAE spontaneously. Psych: Responds to questions appropriately with normal affect.    Labs    Chemistry Recent Labs  Lab 02/01/18 2056  02/03/18 0323  02/03/18 2027 02/04/18 0428 02/05/18 0431 02/06/18 0345  NA 141   < > 142   < > 146* 143 146* 147*  K 6.6*   < > 6.4*   < > 5.1 5.3* 4.8 4.2  CL 108   < > 104   < > 107 105 105 101  CO2 20*   < > 26   < > 26 27 30  35*  GLUCOSE 258*   < > 172*   < > 174* 150* 105* 88  BUN 47*   < > 73*   < > 75* 74* 80* 80*  CREATININE 2.53*   < > 3.65*   < > 3.33* 2.88* 2.42* 1.97*  CALCIUM 9.4   < > 9.2   < > 9.1 9.0 9.0 9.0  PROT 6.6  --  7.3  --   --   --   --   --   ALBUMIN 3.2*   < > 3.5  --  2.9* 2.9*  --   --   AST 36  --  44*  --   --   --   --   --   ALT 26  --  45*  --   --   --   --   --   ALKPHOS 59  --  65  --   --   --   --   --   BILITOT 1.2  --  1.4*  --   --   --   --   --   GFRNONAA 20*   < > 13*   < > 14* 17* 21* 27*  GFRAA 23*   < > 15*   < > 16* 20* 24* 31*  ANIONGAP 13   < > 12   < > 13 11 11 11    < > = values in this interval not displayed.    Hematology Recent Labs  Lab 02/04/18 0427 02/05/18 0431 02/06/18 0345  WBC 22.0* 15.1* 12.2*  RBC 2.80* 2.80* 2.88*  HGB 8.6* 8.5* 8.7*  HCT 26.5* 27.1* 28.3*  MCV 94.6 96.8 98.3  MCH 30.7 30.4 30.2  MCHC 32.5 31.4 30.7  RDW 17.0* 17.3* 16.7*  PLT 243 264 261   Cardiac EnzymesNo results for input(s): TROPONINI in the last 168 hours. No results for input(s): TROPIPOC in the last 168 hours.   BNPNo results for input(s): BNP, PROBNP in the last 168 hours.   DDimer No  results for input(s): DDIMER in the last 168 hours.   Radiology    Dg Chest Port 1 View  Result Date: 02/05/2018 CLINICAL DATA:  Acute respiratory failure. EXAM: PORTABLE CHEST 1 VIEW COMPARISON:  02/04/2018 FINDINGS: Endotracheal tube and right IJ approach central venous catheter in stable position. Moderate rotation. Stable postsurgical changes. No evidence of pneumothorax. Stable bilateral lower lobe predominant streaky airspace opacities. Probable bilateral pleural effusions. Osseous structures are without acute abnormality. Soft tissues are grossly normal. IMPRESSION: Stable bilateral streaky lower lobe predominant airspace opacities and bilateral pleural effusions. Findings may represent pulmonary edema with superimposed atelectasis or airspace consolidation and the bilateral lower lobes. Electronically Signed   By: Fidela Salisbury M.D.   On: 02/05/2018 07:14   Telemetry    02/06/18 SB - Personally Reviewed  ECG    No new tracing as of 02/06/18 - Personally Reviewed  Cardiac Studies   TTE: 02/04/2018  - Left ventricle: The cavity size was normal. Wall thickness was normal. Systolic function was vigorous. The estimated ejection fraction was in the range of 65% to 70%. Wall motion was normal; there were no regional wall motion abnormalities. Doppler parameters are consistent with high ventricular filling pressure. - Aortic valve: A mechanical prosthesis was present. There was mild regurgitation. - Mitral valve: A mechanical prosthesis was present. - Left atrium: The atrium was moderately dilated. - Pulmonic valve: Peak gradient (S): 12 mm Hg. - Pulmonary arteries: Systolic pressure was moderately increased. PA peak pressure: 50 mm Hg (S).  Impressions:  - Technically difficult; vigorous LV systolic function; elevated LV filling pressure; s/p AVR with elevated mean gradient of 31 mmHg and moderate AI; s/p MVR with elevated mean gradient 11 mmHg; moderate  LAE; moderate pulmonary hypertension; suggest TEE to further assess.  Patient Profile     57 y.o. female with a hx of  severe aortic stenosis and moderate mitral stenosis status post [employer] Jude AVR and MVR for rheumatic heart disease. Also with a hx of postoperative atrial flutter with no recurrence, morbid obesity, diastolic CHF, chronic kidney disease stage III and hypertension, s/p elective laparoscopic sleeve gastrectomy on 01/24/2018, readmitted for acute omental and possible mesenteric hematomas with, significant Hb drop to 6.5.  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  -Extubated per PCCM 02/05/18>>>doing well [place]  -Hb 8.7 today -Hep gtt per pharmacy secondary to MVR/AVR>>continue to monitor closely for active bleeding, Hb and plts -Echo from 02/04/18 with increased gradients across both prosthetic valves mean transaortic 31 mmHg from prior 25 mmHg, mean trans mitral 11 mmHg from prior 8 mmHg. TEE was recommended. Per chart review, possibly be explained by prosthesis thrombosis but also by increased gradient with fever>>repeat echo now that she is more stable without fever?  2. Chronic diastolic CHF: -Last echocardiogram, 12/03/2016 with normal LV function at 66-year percent with a well seated AV prosthesis as well as MV prosthesis. -Echo from 02/04/18 with LVEF of 65-70% s/p AVR with elevated mean gradient of 31 mmHg and moderate AI; s/p MVR with elevated mean gradient 11 mmHg; moderate LAE; moderate pulmonary hypertension; suggest TEE to further assess -Weight, 317lb today>>340lb on admission  -I&O, net negative 10.9L  -IV Lasix 74m q6H  3. Acute renal failure: -Creatinine, 1.97 today>>improved  from yesterday>>>nehroology consulted 02/02/18 and signed off now that renal function improved  -Baseline appears to be the 1.3-1.4 range remotely -Continue to avoid nephrotoxic medications -Per primary team/nephrology   4.History of postoperative atrial fibrillation/atrial flutter: -Sinus brady now  -Continue to monitor -On Hep gttt per pharmacy for AVR/MVR   5.Morbid obesity s/p gastric sleeve surgery today - -s/precent gastric sleeve, discharged on 01/31/2018 with readmission with possible acute infection and acute abdominal bleed on 02/01/2018 -Per primary and surgical teams  7. Hyperkalemia: -Resolved, K+, 4.2  today -Follow with daily BMET   Signed, JKathyrn DrownNP-C [facility] Pager: [phone]/01/2018, 7:35 AM    For questions or updates, please contact   Please consult [email or web address] for contact info under Cardiology/STEMI.  The patient was seen, examined and discussed with JKathyrn Drown NP  and I agree with the above.   The patient diuresed significantly overnight, with -7 L, and weight loss of 10 g in the last 2 days.  Her kidney function is improving, nephrology signed off.  She is clinically doing better, she is being transferred from ICU to telemetry unit, for now I would continue the same dose of IV diuretics.  Regarding anemia her hemoglobin is stable, she was started on IV heparin last night. We will follow closely.  KEna Dawley MD 02/06/2018

## 2018-02-06 NOTE — Progress Notes (Signed)
Physical Therapy Treatment Patient Details Name: [name] MRN: [id number] DOB: 29-Oct-1960 Today's Date: 02/06/2018    History of Present Illness 57 yo female  S/P gastric sleeve and hernia repair on 01/24/18. [place] 9/3 and returned 9/4 with diarrhea, abdominal pain, found to have Significant intraperitoneal and abdominal wall hematomas. Post  laparoscopy for diagnostic and  aspiration-evacuation of hematoma, washout on 9/6. Extubated 02/05/18. patient has significant history prosthetic MVR and AVR, CKD, OSA.    PT Comments    *Assisted patient to [facility] and back to recliner. Currently requires assist for hygiene. HR 70' sats >91 % on 8 Liters HFNC  Follow Up Recommendations  Home health PT     Equipment Recommendations  None recommended by PT    Recommendations for Other Services OT consult     Precautions / Restrictions Precautions Precautions: Fall Precaution Comments: chronic 3L O2 [place]    Mobility  Bed Mobility Overal bed mobility: Needs Assistance Bed Mobility: Sidelying to Sit;Rolling Rolling: Mod assist Sidelying to sit: Mod assist       General bed mobility comments: pt in chair  Transfers Overall transfer level: Needs assistance Equipment used: Rolling walker (2 wheeled) Transfers: Sit to/from [facility] Sit to Stand: Mod assist Stand pivot transfers: Mod assist       General transfer comment: steady assist to stand from BSC(tight fit). Small shuffle steps to  get to [facility], then back to recliner.  Ambulation/Gait                 Stairs             Wheelchair Mobility    Modified Rankin (Stroke Patients Only)       Balance                                            Cognition Arousal/Alertness: Awake/alert Behavior During Therapy: WFL for tasks assessed/performed Overall Cognitive Status: Within Functional Limits for tasks assessed                                         Exercises      General Comments        Pertinent Vitals/Pain Faces Pain Scale: Hurts a little bit Pain Location: abdomen  Pain Descriptors / Indicators: Sore    Home Living Family/patient expects to be discharged to:: Private residence Living Arrangements: Other relatives Available Help at Discharge: Family Type of Home: Apartment Home Access: Level entry   Home Layout: One level   Additional Comments: chronic oxygen    Prior Function Level of Independence: Independent      Comments: uses 3 liters   PT Goals (current goals can now be found in the care plan section) Acute Rehab PT Goals Patient Stated Goal: home PT Goal Formulation: With patient Time For Goal Achievement: 02/20/18 Potential to Achieve Goals: Good Progress towards PT goals: Progressing toward goals    Frequency    Min 3X/week      PT Plan Current plan remains appropriate    Co-evaluation              AM-PAC PT "6 Clicks" Daily Activity  Outcome Measure  Difficulty turning over in bed (including adjusting bedclothes, sheets and blankets)?: Unable Difficulty moving from lying on back  to sitting on the side of the bed? : Unable Difficulty sitting down on and standing up from a chair with arms (e.g., wheelchair, bedside commode, etc,.)?: Unable Help needed moving to and from a bed to chair (including a wheelchair)?: Total Help needed walking in hospital room?: Total Help needed climbing 3-5 steps with a railing? : Total 6 Click Score: 6    End of Session Equipment Utilized During Treatment: Oxygen Activity Tolerance: Patient tolerated treatment well Patient left: in chair;with call bell/phone within reach Nurse Communication: Mobility status PT Visit Diagnosis: Difficulty in walking, not elsewhere classified (R26.2);Muscle weakness (generalized) (M62.81)     Time: 1430-1500 PT Time Calculation (min) (ACUTE ONLY): 30 min  Charges:  $Therapeutic Activity: 23-37 mins                      [facility] PT [phone]    Claretha Cooper 02/06/2018, 4:56 PM

## 2018-02-06 NOTE — Progress Notes (Signed)
Patient ID: [name], female   DOB: [birth year], 57 y.o.   MRN: [id number]         [facility]  Date of Admission:  02/01/2018           Day 5 piperacillin tazobactam ASSESSMENT: She is now afebrile.  Admission blood cultures remain negative.  At the time of her exploratory laparotomy 3 days ago large amount of blood and blood clots were evacuated.  There was no evidence of leak or peritonitis.  Gram stain of the blood showed no organisms and cultures are negative.  I agree with stopping empiric piperacillin tazobactam now.  PLAN: 1. Discontinue piperacillin tazobactam 2. 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 sodium chloride   Intravenous Once  . acetaminophen  650 mg Oral Q6H  . arformoterol  15 mcg Nebulization BID  . budesonide (PULMICORT) nebulizer solution  0.5 mg Nebulization BID  . fluticasone  2 spray Each Nare Daily  . furosemide  40 mg Intravenous  Q6H  . insulin aspart  0-9 Units Subcutaneous Q4H  . mouth rinse  15 mL Mouth Rinse BID  . metoprolol tartrate  25 mg Oral BID  . pantoprazole (PROTONIX) IV  40 mg Intravenous Daily  . protein supplement shake  6 oz Oral QID  . warfarin  10 mg Oral ONCE-1800  . Warfarin - Pharmacist Dosing Inpatient   Does not apply q1800   Continuous Infusions: . heparin 1,800 Units/hr (02/06/18 1119)  . ondansetron (ZOFRAN) IV    . piperacillin-tazobactam (ZOSYN)  IV Stopped (02/06/18 1239)   PRN Meds:.ipratropium-albuterol, ondansetron (ZOFRAN) IV **OR** ondansetron (ZOFRAN) IV   SUBJECTIVE: She is feeling much better today with less abdominal pain.  Review of Systems: Review of Systems  Constitutional: Negative for chills, diaphoresis and fever.  Gastrointestinal: Positive for abdominal pain. Negative for diarrhea, nausea and vomiting.    No Known Allergies  OBJECTIVE: Vitals:   02/06/18 0952 02/06/18 1000 02/06/18 1100 02/06/18 1200  BP:  (!) 133/57    Pulse:  65 61   Resp:  19 18   Temp:    98.5 F (36.9 C)  TempSrc:    Oral  SpO2: 97% 98% 94%   Weight:      Height:       Body mass index is 52.76 kg/m.  Physical Exam  Constitutional: She is oriented to person, place, and time.  She appears much more comfortable.  She is sitting  up in a chair.  Cardiovascular: Normal rate, regular rhythm and normal heart sounds.  Pulmonary/Chest: Effort normal and breath sounds normal.  Abdominal: Soft.  No change in extensive ecchymoses over abdominal wall.  She is less distended and tender.  Neurological: She is alert and oriented to person, place, and time.  Skin: No rash noted.  Psychiatric: She has a normal mood and affect.    Lab Results Lab Results  Component Value Date   WBC 12.2 (H) 02/06/2018   HGB 8.7 (L) 02/06/2018   HCT 28.3 (L) 02/06/2018   MCV 98.3 02/06/2018   PLT 261 02/06/2018    Lab Results  Component Value Date   CREATININE 1.97 (H) 02/06/2018   BUN 80 (H)  02/06/2018   NA 147 (H) 02/06/2018   K 4.2 02/06/2018   CL 101 02/06/2018   CO2 35 (H) 02/06/2018    Lab Results  Component Value Date   ALT 45 (H) 02/03/2018   AST 44 (H) 02/03/2018   ALKPHOS 65 02/03/2018   BILITOT 1.4 (H) 02/03/2018     Microbiology: Recent Results (from the past 240 hour(s))  Culture, Urine     Status: Abnormal   Collection Time: 02/02/18 10:03 AM  Result Value Ref Range Status   Specimen Description   Final    URINE, RANDOM Performed at [facility], [place] [address] [place], [place] [address]    Special Requests   Final    NONE Performed at [facility], [place] [address], [place], [place] [address]    Culture 20,000 COLONIES/mL ENTEROCOCCUS FAECALIS (A)  Final   Report Status 02/04/2018 FINAL  Final   Organism ID, Bacteria ENTEROCOCCUS FAECALIS (A)  Final      Susceptibility   Enterococcus faecalis - MIC*    AMPICILLIN <=2 SENSITIVE Sensitive     LEVOFLOXACIN 0.5 SENSITIVE Sensitive     NITROFURANTOIN <=16 SENSITIVE Sensitive     VANCOMYCIN 2 SENSITIVE Sensitive     * 20,000 COLONIES/mL ENTEROCOCCUS FAECALIS  Culture, blood (Routine X 2) w Reflex to ID Panel     Status: None (Preliminary result)   Collection Time: 02/02/18 10:59 AM  Result Value Ref Range Status   Specimen Description   Final    BLOOD LEFT ARM Performed at [facility] [address] [place], [place] [address]    Special Requests   Final    BOTTLES DRAWN AEROBIC AND ANAEROBIC Blood Culture adequate volume Performed at [facility] [address], [place], [place] [address]    Culture   Final    NO GROWTH 4 DAYS Performed at [facility], [place] [address] [place], [place] [address]    Report Status PENDING  Incomplete  Culture, blood (Routine X 2) w Reflex to ID Panel     Status: None (Preliminary result)   Collection Time: 02/02/18 11:01 AM  Result Value Ref Range Status   Specimen  Description   Final    BLOOD LEFT ARM Performed at [facility] [address], [place], [place] [address]    Special Requests   Final    BOTTLES DRAWN AEROBIC AND ANAEROBIC Blood Culture results may not be optimal due to an inadequate volume of blood received in culture bottles Performed at [facility] [address], [place], [place] [address]    Culture   Final    NO GROWTH 4 DAYS Performed at [facility], [address] [place], [place] [address]    Report Status PENDING  Incomplete  Aerobic/Anaerobic Culture (surgical/deep wound)     Status: None (Preliminary result)   Collection Time: 02/03/18  4:20 PM  Result Value Ref Range Status   Specimen Description   Final    BLOOD VESSEL Performed at [facility] [address], [place], [place] [address]    Special Requests   Final    NONE Performed at [facility], [place] [address], [place], [place] [address]    Gram Stain   Final    RARE WBC PRESENT,BOTH PMN AND MONONUCLEAR NO ORGANISMS SEEN    Culture   Final    NO GROWTH 3 DAYS NO ANAEROBES ISOLATED; CULTURE IN PROGRESS FOR 5 DAYS Performed at [facility], [place] [address], [place], [place] [address]    Report Status PENDING  Incomplete    Michel Bickers, MD [facility] [phone] pager   [phone] cell 02/06/2018, 3:00 PM

## 2018-02-07 ENCOUNTER — Ambulatory Visit: Payer: Self-pay

## 2018-02-07 ENCOUNTER — Ambulatory Visit: Payer: Self-pay | Admitting: Nurse Practitioner

## 2018-02-07 LAB — CBC
HCT: 31.5 % — ABNORMAL LOW (ref 36.0–46.0)
Hemoglobin: 9.8 g/dL — ABNORMAL LOW (ref 12.0–15.0)
MCH: 30.6 pg (ref 26.0–34.0)
MCHC: 31.1 g/dL (ref 30.0–36.0)
MCV: 98.4 fL (ref 78.0–100.0)
PLATELETS: 285 10*3/uL (ref 150–400)
RBC: 3.2 MIL/uL — AB (ref 3.87–5.11)
RDW: 16.4 % — ABNORMAL HIGH (ref 11.5–15.5)
WBC: 9.7 10*3/uL (ref 4.0–10.5)

## 2018-02-07 LAB — GLUCOSE, CAPILLARY
GLUCOSE-CAPILLARY: 80 mg/dL (ref 70–99)
GLUCOSE-CAPILLARY: 88 mg/dL (ref 70–99)
GLUCOSE-CAPILLARY: 97 mg/dL (ref 70–99)
GLUCOSE-CAPILLARY: 86 mg/dL (ref 70–99)
Glucose-Capillary: 102 mg/dL — ABNORMAL HIGH (ref 70–99)
Glucose-Capillary: 78 mg/dL (ref 70–99)
Glucose-Capillary: 80 mg/dL (ref 70–99)

## 2018-02-07 LAB — LACTIC ACID, PLASMA: Lactic Acid, Venous: 0.6 mmol/L (ref 0.5–1.9)

## 2018-02-07 LAB — HEPARIN LEVEL (UNFRACTIONATED)
HEPARIN UNFRACTIONATED: 0.4 [IU]/mL (ref 0.30–0.70)
HEPARIN UNFRACTIONATED: 0.48 [IU]/mL (ref 0.30–0.70)
Heparin Unfractionated: 0.51 IU/mL (ref 0.30–0.70)

## 2018-02-07 LAB — CULTURE, BLOOD (ROUTINE X 2)
CULTURE: NO GROWTH
Culture: NO GROWTH
SPECIAL REQUESTS: ADEQUATE

## 2018-02-07 LAB — PROTIME-INR
INR: 1.17
PROTHROMBIN TIME: 14.8 s (ref 11.4–15.2)

## 2018-02-07 LAB — PHOSPHORUS: Phosphorus: 3.9 mg/dL (ref 2.5–4.6)

## 2018-02-07 LAB — MAGNESIUM: Magnesium: 2.1 mg/dL (ref 1.7–2.4)

## 2018-02-07 MED ORDER — BUPROPION HCL ER (XL) 300 MG PO TB24
300.0000 mg | ORAL_TABLET | Freq: Every day | ORAL | Status: DC
  Administered 2018-02-07 – 2018-02-11 (×5): 300 mg via ORAL
  Filled 2018-02-07 (×5): qty 1

## 2018-02-07 MED ORDER — WARFARIN SODIUM 5 MG PO TABS
10.0000 mg | ORAL_TABLET | Freq: Once | ORAL | Status: AC
  Administered 2018-02-07: 10 mg via ORAL
  Filled 2018-02-07: qty 2

## 2018-02-07 MED ORDER — PRO-STAT SUGAR FREE PO LIQD
30.0000 mL | Freq: Two times a day (BID) | ORAL | Status: DC
  Administered 2018-02-07: 30 mL via ORAL
  Filled 2018-02-07 (×2): qty 30

## 2018-02-07 MED ORDER — FUROSEMIDE 10 MG/ML IJ SOLN
40.0000 mg | Freq: Two times a day (BID) | INTRAMUSCULAR | Status: DC
  Administered 2018-02-07 – 2018-02-12 (×10): 40 mg via INTRAVENOUS
  Filled 2018-02-07 (×10): qty 4

## 2018-02-07 MED ORDER — CITALOPRAM HYDROBROMIDE 20 MG PO TABS
20.0000 mg | ORAL_TABLET | Freq: Every day | ORAL | Status: DC
  Administered 2018-02-07 – 2018-02-11 (×5): 20 mg via ORAL
  Filled 2018-02-07 (×5): qty 1

## 2018-02-07 NOTE — Care Management (Signed)
[id number] [name],BSN,RN3,[phone]/ extubated on [id number] to hfnc at 40%/Iv heparin started/iv zofran for nausea/npo.
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
## 2018-02-07 NOTE — Progress Notes (Signed)
Progress Note  Patient Name: [name] Date of Encounter: 02/07/2018  Primary Cardiologist: Sinclair Grooms, MD   Subjective   Pt extubated. Feeling better. Significant diuresis of 17 liters in 3 days.   Inpatient Medications    Scheduled Meds: . sodium chloride   Intravenous Once  . acetaminophen  650 mg Oral Q6H  . arformoterol  15 mcg Nebulization BID  . budesonide (PULMICORT) nebulizer solution  0.5 mg Nebulization BID  . buPROPion  300 mg Oral Daily  . citalopram  20 mg Oral Daily  . feeding supplement (PRO-STAT SUGAR FREE 64)  30 mL Oral BID  . fluticasone  2 spray Each Nare Daily  . furosemide  40 mg Intravenous Q6H  . insulin aspart  0-9 Units Subcutaneous Q4H  . mouth rinse  15 mL Mouth Rinse BID  . metoprolol tartrate  25 mg Oral BID  . pantoprazole (PROTONIX) IV  40 mg Intravenous Daily  . Warfarin - Pharmacist Dosing Inpatient   Does not apply q1800   Continuous Infusions: . heparin 1,600 Units/hr (02/07/18 0945)  . ondansetron (ZOFRAN) IV     PRN Meds: ipratropium-albuterol, ondansetron (ZOFRAN) IV **OR** ondansetron (ZOFRAN) IV   Vital Signs    Vitals:   02/07/18 0725 02/07/18 0924 02/07/18 1101 02/07/18 1105  BP:  (!) 128/55    Pulse:  72    Resp:      Temp: 97.6 F (36.4 C)   97.8 F (36.6 C)  TempSrc: Oral   Oral  SpO2:   99%   Weight:      Height:        Intake/Output Summary (Last 24 hours) at 02/07/2018 1230 Last data filed at 02/07/2018 1110 Gross per 24 hour  Intake 570.05 ml  Output 3600 ml  Net -3029.95 ml   Filed Weights   02/04/18 0415 02/06/18 0500 02/07/18 0359  Weight: (!) 154.8 kg (!) 143.8 kg (!) 137.6 kg   Physical Exam   General: Obese, NAD Skin: Warm, dry, intact  Head: Normocephalic, atraumatic, clear, moist mucus membranes. Neck: Negative for carotid bruits. No JVD Lungs:Clear to ausculation bilaterally. No wheezes, rales, or rhonchi. Breathing is unlabored. Cardiovascular: RRR with S1 S2. No  murmurs, rubs, gallops, or LV heave appreciated. Abdomen: Firm, distended, tender, hypoactive bowel sounds.  MSK: Strength and tone appear normal for age. 5/5 in all extremities Extremities: R>L BLE edema. No clubbing or cyanosis. DP/PT pulses 1+ bilaterally Neuro: Alert and oriented. No focal deficits. No facial asymmetry. MAE spontaneously. Psych: Responds to questions appropriately with normal affect.    Labs    Chemistry Recent Labs  Lab 02/01/18 2056  02/03/18 0323  02/03/18 2027 02/04/18 0428 02/05/18 0431 02/06/18 0345 02/06/18 2307  NA 141   < > 142   < > 146* 143 146* 147* 147*  K 6.6*   < > 6.4*   < > 5.1 5.3* 4.8 4.2 3.9  CL 108   < > 104   < > 107 105 105 101 97*  CO2 20*   < > 26   < > 26 27 30  35* 37*  GLUCOSE 258*   < > 172*   < > 174* 150* 105* 88 89  BUN 47*   < > 73*   < > 75* 74* 80* 80* 67*  CREATININE 2.53*   < > 3.65*   < > 3.33* 2.88* 2.42* 1.97* 1.77*  CALCIUM 9.4   < > 9.2   < >  9.1 9.0 9.0 9.0 9.0  PROT 6.6  --  7.3  --   --   --   --   --   --   ALBUMIN 3.2*   < > 3.5  --  2.9* 2.9*  --   --   --   AST 36  --  44*  --   --   --   --   --   --   ALT 26  --  45*  --   --   --   --   --   --   ALKPHOS 59  --  65  --   --   --   --   --   --   BILITOT 1.2  --  1.4*  --   --   --   --   --   --   GFRNONAA 20*   < > 13*   < > 14* 17* 21* 27* 31*  GFRAA 23*   < > 15*   < > 16* 20* 24* 31* 35*  ANIONGAP 13   < > 12   < > 13 11 11 11 13    < > = values in this interval not displayed.    Hematology Recent Labs  Lab 02/05/18 0431 02/06/18 0345 02/07/18 0322  WBC 15.1* 12.2* 9.7  RBC 2.80* 2.88* 3.20*  HGB 8.5* 8.7* 9.8*  HCT 27.1* 28.3* 31.5*  MCV 96.8 98.3 98.4  MCH 30.4 30.2 30.6  MCHC 31.4 30.7 31.1  RDW 17.3* 16.7* 16.4*  PLT 264 261 285   Cardiac EnzymesNo results for input(s): TROPONINI in the last 168 hours. No results for input(s): TROPIPOC in the last 168 hours.   BNPNo results for input(s): BNP, PROBNP in the last 168 hours.   DDimer  No results for input(s): DDIMER in the last 168 hours.   Radiology    Dg Chest Port 1 View  Result Date: 02/06/2018 CLINICAL DATA:  Acute respiratory failure. EXAM: PORTABLE CHEST 1 VIEW COMPARISON:  02/05/2018 FINDINGS: Post extubation. Postsurgical changes and right IJ approach central venous catheter, stable. Mildly enlarged cardiac silhouette. There is no evidence of focal pneumothorax. Low lung volumes with bilateral lower lobe streaky airspace opacities. Osseous structures are without acute abnormality. Soft tissues are grossly normal. IMPRESSION: Low lung volumes with bilateral lower lobe streaky airspace opacities may represent atelectasis versus airspace consolidation. Small bilateral subpulmonic effusions cannot be excluded. Electronically Signed   By: Fidela Salisbury M.D.   On: 02/06/2018 07:43   Telemetry    02/06/18 SB - Personally Reviewed  ECG    No new tracing as of 02/06/18 - Personally Reviewed  Cardiac Studies   TTE: 02/04/2018  - Left ventricle: The cavity size was normal. Wall thickness was normal. Systolic function was vigorous. The estimated ejection fraction was in the range of 65% to 70%. Wall motion was normal; there were no regional wall motion abnormalities. Doppler parameters are consistent with high ventricular filling pressure. - Aortic valve: A mechanical prosthesis was present. There was mild regurgitation. - Mitral valve: A mechanical prosthesis was present. - Left atrium: The atrium was moderately dilated. - Pulmonic valve: Peak gradient (S): 12 mm Hg. - Pulmonary arteries: Systolic pressure was moderately increased. PA peak pressure: 50 mm Hg (S).  Impressions:  - Technically difficult; vigorous LV systolic function; elevated LV filling pressure; s/p AVR with elevated mean gradient of 31 mmHg and moderate AI; s/p MVR with elevated mean gradient  11 mmHg; moderate LAE; moderate pulmonary hypertension; suggest TEE  to further assess.  Patient Profile     57 y.o. 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 Also with a hx of postoperative atrial flutter with no recurrence, morbid obesity, diastolic CHF, chronic kidney disease stage III and hypertension, s/p elective laparoscopic sleeve gastrectomy on 01/24/2018, readmitted for acute omental and possible mesenteric hematomas with, significant Hb drop to 6.5.  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  -Extubated per PCCM 02/05/18>>>doing well [place]  -Hb 9.8 today -Hep gtt per pharmacy secondary to MVR/AVR>>continue to monitor closely for active bleeding, Hb and plts -Echo from 02/04/18 with increased gradients across both prosthetic valves mean transaortic 31 mmHg from prior 25 mmHg, mean trans mitral 11 mmHg from prior 8 mmHg. TEE was recommended. Per chart review, possibly be explained by prosthesis thrombosis but also by increased gradient with fever>>repeat echo now that she is more stable without fever?  2. Acute on Chronic diastolic CHF: -Weight, 530 -->137 kg in 3 days - I will decrease lasix 40 mg to BID.  - improved aeration on CXR  3. Acute renal failure: -Creatinine, 1.77 today>>improved from yesterday>>>nehroology consulted 02/02/18 and signed off now that renal function improved  -Baseline appears to be the 1.3-1.4 range remotely -Continue to avoid nephrotoxic medications -Per primary team/nephrology   4.History of postoperative atrial fibrillation/atrial flutter: -Sinus brady now  -Continue to monitor -On Hep gttt per pharmacy for AVR/MVR   5.Morbid  obesity s/p gastric sleeve surgery today - -s/precent gastric sleeve, discharged on 01/31/2018 with readmission with possible acute infection and acute abdominal bleed on 02/01/2018 -Per primary and surgical teams  7. Hyperkalemia: -Resolved, K+, 3.9  today -Follow with daily BMET   She can be transferred to telemetry.  Ena Dawley, MD 02/07/2018

## 2018-02-07 NOTE — Progress Notes (Signed)
Patient ID: EULANDA DORION, female   DOB: [birth year], 57 y.o.   MRN: [id number]   Progress Note: Metabolic and Bariatric Surgery Service   Chief Complaint/Subjective: No c/o No n/v Had 2 bms No sob.   Objective: Vital signs in last 24 hours: Temp:  [97.6 F (36.4 C)-98.8 F (37.1 C)] 97.6 F (36.4 C) (09/10 0725) Pulse Rate:  [57-72] 72 (09/10 0924) Resp:  [14-22] 15 (09/10 0400) BP: (126-153)/(54-77) 128/55 (09/10 0924) SpO2:  [92 %-99 %] 99 % (09/10 1101) FiO2 (%):  [40 %] 40 % (09/10 0050) Weight:  [137.6 kg] 137.6 kg (09/10 0359) Last BM Date: 02/06/18  Intake/Output from previous day: 09/09 0701 - 09/10 0700 In: 483.8 [I.V.:284.5; IV Piggyback:199.4] Out: 2500 [Urine:2500] Intake/Output this shift: Total I/O In: 86.2 [I.V.:86.2] Out: 1200 [Urine:1200]  Lungs: cta  Cardiovascular: +click,reg  Abd: obese, bruising, incisions ok. Mostly soft  Extremities: chronic RLE lymphedema, no LLE edema  Neuro: nonfocal, alert  Lab Results: CBC  Recent Labs    02/06/18 0345 02/07/18 0322  WBC 12.2* 9.7  HGB 8.7* 9.8*  HCT 28.3* 31.5*  PLT 261 285   BMET Recent Labs    02/06/18 0345 02/06/18 2307  NA 147* 147*  K 4.2 3.9  CL 101 97*  CO2 35* 37*  GLUCOSE 88 89  BUN 80* 67*  CREATININE 1.97* 1.77*  CALCIUM 9.0 9.0   PT/INR Recent Labs    02/07/18 0322  LABPROT 14.8  INR 1.17   ABG Recent Labs    02/05/18 0310 02/05/18 0903  PHART 7.425 7.380  HCO3 29.4* 30.2*    Studies/Results:  Anti-infectives: Anti-infectives (From admission, onward)   Start     Dose/Rate Route Frequency Ordered Stop   02/02/18 0800  piperacillin-tazobactam (ZOSYN) IVPB 3.375 g  Status:  Discontinued     3.375 g 12.5 mL/hr over 240 Minutes Intravenous Every 8 hours 02/02/18 0300 02/06/18 1903   02/01/18 2245  piperacillin-tazobactam (ZOSYN) IVPB 3.375 g     3.375 g 100 mL/hr over 30 Minutes Intravenous NOW 02/01/18 2234 02/02/18 0203       Medications: Scheduled Meds: . sodium chloride   Intravenous Once  . acetaminophen  650 mg Oral Q6H  . arformoterol  15 mcg Nebulization BID  . budesonide (PULMICORT) nebulizer solution  0.5 mg Nebulization BID  . feeding supplement (PRO-STAT SUGAR FREE 64)  30 mL Oral BID  . fluticasone  2 spray Each Nare Daily  . furosemide  40 mg Intravenous Q6H  . insulin aspart  0-9 Units Subcutaneous Q4H  . mouth rinse  15 mL Mouth Rinse BID  . metoprolol tartrate  25 mg Oral BID  . pantoprazole (PROTONIX) IV  40 mg Intravenous Daily  . Warfarin - Pharmacist Dosing Inpatient   Does not apply q1800   Continuous Infusions: . heparin 1,600 Units/hr (02/07/18 0945)  . ondansetron (ZOFRAN) IV     PRN Meds:.ipratropium-albuterol, ondansetron (ZOFRAN) IV **OR** ondansetron (ZOFRAN) IV  Assessment/Plan: Patient Active Problem List   Diagnosis Date Noted  . Contraindication to anticoagulation therapy   . Acute diastolic CHF (congestive heart failure), NYHA class 4 ([place])   . Acute on chronic respiratory failure with hypoxia and hypercapnia ([place]) 02/03/2018  . Acute metabolic encephalopathy 68/34/1962  . Fever 02/03/2018  . Leukocytosis 02/03/2018  . Abdominal pain 02/02/2018  . Hyperkalemia 02/02/2018  . Acute kidney injury superimposed on CKD ([place]) 02/02/2018  . Mesenteric hematoma 02/02/2018  . Hemoperitoneum 02/02/2018  . Anemia   .  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 02/01/2018  . Postoperative anemia due to chronic blood loss on full anticoagulation 02/01/2018  . AKI (acute kidney injury) ([place]) 02/01/2018  . Acute renal failure superimposed on stage 3 chronic kidney disease ([place]) 01/26/2018  . PAF (paroxysmal atrial fibrillation) ([place])   . Acute respiratory failure ([place])   . Hypoxia   . Postprocedural hypotension   . Chronic acquired lymphedema - Right lower extremity 01/24/2018  . Hx of mechanical aortic valve replacement 01/24/2018  . Obstructive sleep apnea 02/03/2017  .  Chronic diastolic heart failure ([place]) 01/11/2017  . Morbid obesity with BMI of 50.0-59.9, adult ([place]) 02/11/2016  . HX: long term anticoagulant use 02/11/2016  . Chronic respiratory failure ([place]) 08/12/2015  . 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 Allergic rhinitis 05/02/2015  . Symptomatic anemia 04/08/2015  . History of mitral valve replacement with mechanical valve 04/08/2015  . CKD (chronic kidney disease) stage 3, GFR 30-59 ml/min (HCC) 04/08/2015  . Atrial flutter, unspecified   . Chronic anticoagulation 09/04/2014  . Warfarin anticoagulation 08/14/2014  . ILD (interstitial lung disease) ([place]) 10/12/2013  . Aortic stenosis 10/02/2013  . Hyperlipidemia 10/02/2013  . Chronic asthmatic bronchitis ([place]) 08/06/2013  . Gout 04/16/2011  . Essential hypertension 10/07/2010  . Depressive disorder, not elsewhere classified 10/07/2010   S/p lap sleeve gastrectomy with hiatal hernia repair 01/24/18 Readmitted with AKI on CKD, ABL anemia DIAGNOSTIC LAPAROSCOPY EVACUATION OF HEMATOMA 02/03/2018  Doing well.  -GI - cont bari fulls  -FEN - bari fulls, HLIV, lytes ok  -Renal - aki on ckd, good diuresis, Cr trending down, renal signed off; can we stop the scheduled IV lasix, go back to home lasix dose?  - CV - BP ok. Cont lopressor  -Mechanical valves - defer repeat echo to cards; started hep gtt sunday, no signs of bleeding, started coumadin 9/9 per pharmacy  -ID - no fever, wbc trending down. All cultures negative to date, ID felt wbc/fever could have been from hemoperitoneum. D/c zosyn 9/9  -Endo - blood sugars ok  -pt eval, OOB to chair today  -pulm - cont home nebs  Disposition: CARDS - can we stop q6 lasix Dc central line Ambulate in hall tx to telemetry   LOS: 5 days    Greer Pickerel, MD [phone] [facility]

## 2018-02-07 NOTE — Progress Notes (Signed)
Patient sitting up having some broth this am.  Denies nausea.  Some difficulty with "milky protein shakes", provided with appropriate clear protein.  No concerns/questions this am.  Set expectation with patient of possible transfer to telemetry unit today.  Will continue to monitor.
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
## 2018-02-07 NOTE — Progress Notes (Signed)
Central line removed without incident. Pt tolerated well.  Jacklynn Lewis RN

## 2018-02-07 NOTE — Progress Notes (Signed)
Nutrition Follow-up  DOCUMENTATION CODES:   Morbid obesity  INTERVENTION:  - Will order Prostat BID, each supplement provides 100 kcal, 10 grams of carb, and 15 grams of protein. - Continue protein shake provided by Bariatric Nurse Educator (ProteinO2, each supplement provides 60 kcal, 0 grams of carb, and 15 grams of protein).   NUTRITION DIAGNOSIS:   Inadequate protein intake related to other (see comment)(food and oral nutrition supplement preferences at this time.) as evidenced by per patient/family report. 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  Weight trending down since admission (-35 lb/16 kg). Patient was extubated on 9/8 around 10:45 AM. Diet advanced to bariatric CLD on 9/8 at 3:05 PM and to bariatric FLD on 9/9 at 9:05 AM. Patient has mainly been drinking water and lemonade, per her report. She denies nausea and states only slight abdominal pain. 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 Patient was able to consume a few bites of cream of mushroom soup this AM but states she does not like it and had ordered cream of chicken.    Medications reviewed; 40 mg IV Lasix QID, sliding scale Novolog.  Labs reviewed; CBGs: 80 and 78 mg/dL, Na: 147 mmol/L, Cl: 97 mmol/L, BUN: 67 mg/dL, creatinine: 1.77 mg/dL, GFR: 31 mL/min.       Diet Order:   Diet Order            Diet bariatric full liquid Room service appropriate? Yes; Fluid consistency: Thin  Diet effective now              EDUCATION NEEDS:   Education needs have been addressed  Skin:  Skin Assessment: Skin  Integrity Issues: Skin Integrity Issues:: Incisions Incisions: abdominal (9/6)  Last BM:  9/9  Height:   Ht Readings from Last 1 Encounters:  02/03/18 5\' 5"  (1.651 m)    Weight:   Wt Readings from Last 1 Encounters:  02/07/18 (!) 137.6 kg    Ideal Body Weight:  56.8 kg  BMI:  Body mass index is 50.48 kg/m.  Estimated Nutritional Needs:   Kcal:  1780-2000  Protein:  110-120 grams  Fluid:  1.8L/day     Jarome Matin, MS, RD, LDN, CNSC Inpatient Clinical Dietitian Pager # [phone] After hours/weekend pager # [phone]

## 2018-02-07 NOTE — Progress Notes (Signed)
ANTICOAGULATION CONSULT NOTE - Follow Up Consult  Pharmacy Consult for Heparin Indication: mechanical AVR and MVR  No Known Allergies  Patient Measurements: Height: 5\' 5"  (165.1 cm) Weight: (!) 317 lb 0.3 oz (143.8 kg) IBW/kg (Calculated) : 57 Heparin Dosing Weight:   Vital Signs: Temp: 98.7 F (37.1 C) (09/09 2331) Temp Source: Oral (09/09 2331) BP: 127/61 (09/10 0050) Pulse Rate: 59 (09/10 0050)  Labs: Recent Labs    02/04/18 0427  02/05/18 0431  02/06/18 0345 02/06/18 0841 02/06/18 1732 02/06/18 2307 02/07/18 0033  HGB 8.6*  --  8.5*  --  8.7*  --   --   --   --   HCT 26.5*  --  27.1*  --  28.3*  --   --   --   --   PLT 243  --  264  --  261  --   --   --   --   LABPROT 17.8*  --   --   --   --   --   --   --   --   INR 1.48  --   --   --   --   --   --   --   --   HEPARINUNFRC  --   --   --    < >  --  0.59 0.54  --  0.51  CREATININE  --    < > 2.42*  --  1.97*  --   --  1.77*  --    < > = values in this interval not displayed.    Estimated Creatinine Clearance: 50.2 mL/min (A) (by C-G formula based on SCr of 1.77 mg/dL (H)).   Medications:  Infusions:  . heparin 1,600 Units/hr (02/07/18 0246)  . ondansetron [facility]) IV      Assessment: Patient with high heparin level when using reduced goal of 0.3-0.5.  No heparin issues per RN.  Goal of Therapy:  Heparin level 0.3-0.5 units/ml Monitor platelets by anticoagulation protocol: Yes   Plan:  Decrease heparin to 1600 units/hr Recheck level at [address] [place] 02/07/2018,2:46 AM

## 2018-02-07 NOTE — Progress Notes (Signed)
Progress Note  Patient Name: [name] Date of Encounter: 02/07/2018  Primary Cardiologist: Sinclair Grooms, MD   Subjective   Sitting up in chair, doing better, somewhat emotional.   Inpatient Medications    Scheduled Meds: . sodium chloride   Intravenous Once  . acetaminophen  650 mg Oral Q6H  . arformoterol  15 mcg Nebulization BID  . budesonide (PULMICORT) nebulizer solution  0.5 mg Nebulization BID  . feeding supplement (PRO-STAT SUGAR FREE 64)  30 mL Oral BID  . fluticasone  2 spray Each Nare Daily  . furosemide  40 mg Intravenous Q6H  . insulin aspart  0-9 Units Subcutaneous Q4H  . mouth rinse  15 mL Mouth Rinse BID  . metoprolol tartrate  25 mg Oral BID  . pantoprazole (PROTONIX) IV  40 mg Intravenous Daily  . Warfarin - Pharmacist Dosing Inpatient   Does not apply q1800   Continuous Infusions: . heparin 1,600 Units/hr (02/07/18 0945)  . ondansetron (ZOFRAN) IV     PRN Meds: ipratropium-albuterol, ondansetron (ZOFRAN) IV **OR** ondansetron (ZOFRAN) IV   Vital Signs    Vitals:   02/07/18 0400 02/07/18 0725 02/07/18 0924 02/07/18 1101  BP: 126/62  (!) 128/55   Pulse: 63  72   Resp: 15     Temp: 98.4 F (36.9 C) 97.6 F (36.4 C)    TempSrc: Oral Oral    SpO2: 95%   99%  Weight:      Height:        Intake/Output Summary (Last 24 hours) at 02/07/2018 1138 Last data filed at 02/07/2018 0945 Gross per 24 hour  Intake 570.05 ml  Output 3700 ml  Net -3129.95 ml   Filed Weights   02/04/18 0415 02/06/18 0500 02/07/18 0359  Weight: (!) 154.8 kg (!) 143.8 kg (!) 137.6 kg    Telemetry    SR with beats NSVT once  - Personally Reviewed  ECG    No new - Personally Reviewed  Physical Exam   GEN: No acute distress.   Neck: No JVD Cardiac: RRR, no murmurs, rubs, or gallops.  Respiratory: Clear to auscultation bilaterally, anteriorly . GI: Soft, nontender, non-distended  MS: + Rt lower ext edema lt present ; No deformity. Neuro:  Nonfocal    Psych: Normal affect   Labs    Chemistry Recent Labs  Lab 02/01/18 2056  02/03/18 0323  02/03/18 2027 02/04/18 0428 02/05/18 0431 02/06/18 0345 02/06/18 2307  NA 141   < > 142   < > 146* 143 146* 147* 147*  K 6.6*   < > 6.4*   < > 5.1 5.3* 4.8 4.2 3.9  CL 108   < > 104   < > 107 105 105 101 97*  CO2 20*   < > 26   < > 26 27 30  35* 37*  GLUCOSE 258*   < > 172*   < > 174* 150* 105* 88 89  BUN 47*   < > 73*   < > 75* 74* 80* 80* 67*  CREATININE 2.53*   < > 3.65*   < > 3.33* 2.88* 2.42* 1.97* 1.77*  CALCIUM 9.4   < > 9.2   < > 9.1 9.0 9.0 9.0 9.0  PROT 6.6  --  7.3  --   --   --   --   --   --   ALBUMIN 3.2*   < > 3.5  --  2.9* 2.9*  --   --   --  AST 36  --  44*  --   --   --   --   --   --   ALT 26  --  45*  --   --   --   --   --   --   ALKPHOS 59  --  65  --   --   --   --   --   --   BILITOT 1.2  --  1.4*  --   --   --   --   --   --   GFRNONAA 20*   < > 13*   < > 14* 17* 21* 27* 31*  GFRAA 23*   < > 15*   < > 16* 20* 24* 31* 35*  ANIONGAP 13   < > 12   < > 13 11 11 11 13    < > = values in this interval not displayed.     Hematology Recent Labs  Lab 02/05/18 0431 02/06/18 0345 02/07/18 0322  WBC 15.1* 12.2* 9.7  RBC 2.80* 2.88* 3.20*  HGB 8.5* 8.7* 9.8*  HCT 27.1* 28.3* 31.5*  MCV 96.8 98.3 98.4  MCH 30.4 30.2 30.6  MCHC 31.4 30.7 31.1  RDW 17.3* 16.7* 16.4*  PLT 264 261 285    Cardiac EnzymesNo results for input(s): TROPONINI in the last 168 hours. No results for input(s): TROPIPOC in the last 168 hours.   BNPNo results for input(s): BNP, PROBNP in the last 168 hours.   DDimer No results for input(s): DDIMER in the last 168 hours.   Radiology    Dg Chest Port 1 View  Result Date: 02/06/2018 CLINICAL DATA:  Acute respiratory failure. EXAM: PORTABLE CHEST 1 VIEW COMPARISON:  02/05/2018 FINDINGS: Post extubation. Postsurgical changes and right IJ approach central venous catheter, stable. Mildly enlarged cardiac silhouette. There is no evidence of focal  pneumothorax. Low lung volumes with bilateral lower lobe streaky airspace opacities. Osseous structures are without acute abnormality. Soft tissues are grossly normal. IMPRESSION: Low lung volumes with bilateral lower lobe streaky airspace opacities may represent atelectasis versus airspace consolidation. Small bilateral subpulmonic effusions cannot be excluded. Electronically Signed   By: Fidela Salisbury M.D.   On: 02/06/2018 07:43    Cardiac Studies   ECHO 02/04/18 Study Conclusions  - Left ventricle: The cavity size was normal. Wall thickness was   normal. Systolic function was vigorous. The estimated ejection   fraction was in the range of 65% to 70%. Wall motion was normal;   there were no regional wall motion abnormalities. Doppler   parameters are consistent with high ventricular filling pressure. - Aortic valve: A mechanical prosthesis was present. There was mild   regurgitation. - Mitral valve: A mechanical prosthesis was present. - Left atrium: The atrium was moderately dilated. - Pulmonic valve: Peak gradient (S): 12 mm Hg. - Pulmonary arteries: Systolic pressure was moderately increased.   PA peak pressure: 50 mm Hg (S).  Impressions:  - Technically difficult; vigorous LV systolic function; elevated LV   filling pressure; s/p AVR with elevated mean gradient of 31 mmHg   and moderate AI; s/p MVR with elevated mean gradient 11 mmHg;   moderate LAE; moderate pulmonary hypertension; suggest TEE to   further assess.  Patient Profile     57 y.o. 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 Also with a hx of postoperative atrial flutter  with no recurrence, morbid obesity, diastolic CHF, chronic kidney disease stage III and hypertension, s/p elective laparoscopic sleeve gastrectomy on 01/24/2018, readmitted for acute omental and possible mesenteric hematomas with, significant Hb drop to  6.5.  Assessment & Plan   1.History of aortic and mitral valve replacement 2016(St. Jude), on chronic Coumadin admitted s/p gastric surgery with severe mesenteric hematoma: -s/p diagnostic laparoscopy with evacuation of hematoma 02/03/18 with removal of 2L blood with no definitive bleeding source, no evidence of leak or infection.  -Extubated per PCCM 02/05/18>>>doing well [place]  -Hb 8.7 today -Hep gtt per pharmacy secondary to MVR/AVR>>continue to monitor closely for active bleeding, Hb and plts  INR today 1.17 -Echo from 02/04/18 with increased gradients across both prosthetic valves mean transaortic 31 mmHg from prior 25 mmHg, mean trans mitral 11 mmHg from prior 8 mmHg. TEE was recommended.   2. Chronic diastolic CHF: -Last echocardiogram, 12/03/2016 with normal LV function at 66-year percent with a well seated AV prosthesis as well as MV prosthesis. -Echo from 02/04/18 with LVEF of 65-70% s/p AVR with elevated mean gradient of 31 mmHg and moderate AI; s/p MVR with elevated mean gradient 11 mmHg; moderate LAE; moderate pulmonary hypertension; suggest TEE to further assess -Weight, from 153 Kg to 137.6 kg today.   -I&O, net negative 14,792   -IV Lasix 40mg  q6H  Will change to BID  3.Acute renal failure: -Creatinine,1.77 today>>improved from yesterday>>>nehroology consulted 02/02/18 and signed off now that renal function improved  -Baseline appears to be the 1.3-1.4 range remotely -Continue to avoid nephrotoxic medications -Per primary team/nephrology  4.History of postoperative atrial fibrillation/atrial flutter: -Sinus brady to SR now  -Continue to monitor -On Hep gttt per pharmacy for AVR/MVR   5.Morbid obesity s/p gastric sleeve surgery today - -s/precent gastric sleeve, discharged on 01/31/2018 with readmission with possible acute infection and acute abdominal bleed on 02/01/2018 -Per primary and surgical teams  7. Hyperkalemia: -Resolved, K+, 3.9  today -Follow  with daily BMET       For questions or updates, please contact [place] Please consult [email or web address] for contact info under        Signed, Cecilie Kicks, NP  02/07/2018, 11:38 AM

## 2018-02-08 ENCOUNTER — Ambulatory Visit: Payer: Self-pay | Admitting: Registered"

## 2018-02-08 LAB — AEROBIC/ANAEROBIC CULTURE W GRAM STAIN (SURGICAL/DEEP WOUND): Culture: NO GROWTH

## 2018-02-08 LAB — HEPARIN LEVEL (UNFRACTIONATED)
HEPARIN UNFRACTIONATED: 0.61 [IU]/mL (ref 0.30–0.70)
Heparin Unfractionated: 0.41 IU/mL (ref 0.30–0.70)

## 2018-02-08 LAB — RENAL FUNCTION PANEL
Albumin: 3.7 g/dL (ref 3.5–5.0)
Anion gap: 13 (ref 5–15)
BUN: 50 mg/dL — AB (ref 6–20)
CO2: 34 mmol/L — ABNORMAL HIGH (ref 22–32)
CREATININE: 1.29 mg/dL — AB (ref 0.44–1.00)
Calcium: 9.7 mg/dL (ref 8.9–10.3)
Chloride: 96 mmol/L — ABNORMAL LOW (ref 98–111)
GFR, EST AFRICAN AMERICAN: 52 mL/min — AB (ref >60)
GFR, EST NON AFRICAN AMERICAN: 45 mL/min — AB (ref >60)
GLUCOSE: 89 mg/dL (ref 70–99)
PHOSPHORUS: 2.8 mg/dL (ref 2.5–4.6)
Potassium: 3.6 mmol/L (ref 3.5–5.1)
Sodium: 143 mmol/L (ref 135–145)

## 2018-02-08 LAB — CBC
HCT: 35.9 % — ABNORMAL LOW (ref 36.0–46.0)
Hemoglobin: 11.1 g/dL — ABNORMAL LOW (ref 12.0–15.0)
MCH: 30.2 pg (ref 26.0–34.0)
MCHC: 30.9 g/dL (ref 30.0–36.0)
MCV: 97.6 fL (ref 78.0–100.0)
Platelets: 309 10*3/uL (ref 150–400)
RBC: 3.68 MIL/uL — ABNORMAL LOW (ref 3.87–5.11)
RDW: 16.1 % — AB (ref 11.5–15.5)
WBC: 7.9 10*3/uL (ref 4.0–10.5)

## 2018-02-08 LAB — PROTIME-INR
INR: 1.34
Prothrombin Time: 16.4 seconds — ABNORMAL HIGH (ref 11.4–15.2)

## 2018-02-08 LAB — PHOSPHORUS: Phosphorus: 2.7 mg/dL (ref 2.5–4.6)

## 2018-02-08 LAB — GLUCOSE, CAPILLARY
GLUCOSE-CAPILLARY: 77 mg/dL (ref 70–99)
GLUCOSE-CAPILLARY: 112 mg/dL — AB (ref 70–99)
Glucose-Capillary: 80 mg/dL (ref 70–99)
Glucose-Capillary: 103 mg/dL — ABNORMAL HIGH (ref 70–99)
Glucose-Capillary: 97 mg/dL (ref 70–99)

## 2018-02-08 LAB — MAGNESIUM: Magnesium: 2.3 mg/dL (ref 1.7–2.4)

## 2018-02-08 LAB — AEROBIC/ANAEROBIC CULTURE (SURGICAL/DEEP WOUND)

## 2018-02-08 MED ORDER — WARFARIN SODIUM 5 MG PO TABS
5.0000 mg | ORAL_TABLET | Freq: Once | ORAL | Status: AC
  Administered 2018-02-08: 5 mg via ORAL
  Filled 2018-02-08: qty 1

## 2018-02-08 MED ORDER — HEPARIN (PORCINE) IN NACL 100-0.45 UNIT/ML-% IJ SOLN
1500.0000 [IU]/h | INTRAMUSCULAR | Status: DC
  Administered 2018-02-08 (×2): 1500 [IU]/h via INTRAVENOUS
  Filled 2018-02-08: qty 250

## 2018-02-08 NOTE — Care Management Important Message (Signed)
Important Message  Patient Details  Name: ALBANA SAPERSTEIN MRN: [id number] Date of Birth: 1961-03-18   Medicare Important Message Given:  Yes    Kerin Salen 02/08/2018, 11:10 AMImportant Message  Patient Details  Name: CASANDRA DALLAIRE MRN: [id number] Date of Birth: 1961/01/26   Medicare Important Message Given:  Yes    Kerin Salen 02/08/2018, 11:10 AM

## 2018-02-08 NOTE — Progress Notes (Addendum)
Patient ready to transition to Phase III diet per Dr [name] direction.  Patient educated to diet and rules of transition.  Written material provided.  Including the 15-30-30 rule and Phase III diet. Educated to take small bites (size of thumbnail) and chewing food to applesauce consistency or approximately 20-30 chews.  Instruction provided to eat slowly by using nondominant hand and placing utensil down between bites.  Patient also made aware it is appropriate to supplement with protein shakes.  Due to dislike of milky protein Premiere Clear Protein provided to patient to encourage protein intake.  Staff report patient does not like the prostat protein currently ordered and has refused supplement.  Staff educated to diet.  Contact information provided should questions/problems arise with diet transition.  Will continue to follow  [facility] Bariatric Nurse Coordinator [phone]

## 2018-02-08 NOTE — Progress Notes (Signed)
After having a liquid, brown BM in BSC, a white pill was noted in bottom of BSC. Pill was not absorbed and could possibly be Wellbutrin. MD [name] notified.
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
## 2018-02-08 NOTE — Plan of Care (Signed)
Plan of care discussed.   

## 2018-02-08 NOTE — Progress Notes (Addendum)
Progress Note  Patient Name: [name] Date of Encounter: 02/08/2018  Primary Cardiologist: Sinclair Grooms, MD  Subjective   She feels better, tired, in chair, transferred out of unit yesterday.  Inpatient Medications    Scheduled Meds: . sodium chloride   Intravenous Once  . acetaminophen  650 mg Oral Q6H  . arformoterol  15 mcg Nebulization BID  . budesonide (PULMICORT) nebulizer solution  0.5 mg Nebulization BID  . buPROPion  300 mg Oral Daily  . citalopram  20 mg Oral Daily  . feeding supplement (PRO-STAT SUGAR FREE 64)  30 mL Oral BID  . fluticasone  2 spray Each Nare Daily  . furosemide  40 mg Intravenous BID  . insulin aspart  0-9 Units Subcutaneous Q4H  . mouth rinse  15 mL Mouth Rinse BID  . metoprolol tartrate  25 mg Oral BID  . pantoprazole (PROTONIX) IV  40 mg Intravenous Daily  . warfarin  5 mg Oral ONCE-1800  . Warfarin - Pharmacist Dosing Inpatient   Does not apply q1800   Continuous Infusions: . heparin 1,500 Units/hr (02/08/18 0711)  . ondansetron (ZOFRAN) IV     PRN Meds: ipratropium-albuterol, ondansetron (ZOFRAN) IV **OR** ondansetron (ZOFRAN) IV   Vital Signs    Vitals:   02/07/18 1916 02/07/18 2043 02/08/18 0442 02/08/18 0732  BP:  (!) 126/59 129/62   Pulse:  72 60   Resp:  18 20   Temp:  98.6 F (37 C) 98.1 F (36.7 C)   TempSrc:  Oral Oral   SpO2: 98% 100% 97% 96%  Weight:   (!) 138.8 kg   Height:        Intake/Output Summary (Last 24 hours) at 02/08/2018 0915 Last data filed at 02/08/2018 0600 Gross per 24 hour  Intake 889.85 ml  Output 2050 ml  Net -1160.15 ml   Filed Weights   02/07/18 0359 02/07/18 1715 02/08/18 0442  Weight: (!) 137.6 kg (!) 138.5 kg (!) 138.8 kg   Physical Exam   General: Obese, NAD Skin: Warm, dry, intact  Head: Normocephalic, atraumatic, clear, moist mucus membranes. Neck: Negative for carotid bruits. No JVD Lungs:Clear to ausculation bilaterally. No wheezes, rales, or rhonchi.  Breathing is unlabored. Cardiovascular: RRR with S1 S2. No murmurs, rubs, gallops, or LV heave appreciated. Abdomen: Soft, non-tender, non-distended with normoactive bowel sounds. No obvious abdominal masses. MSK: Strength and tone appear normal for age. 5/5 in all extremities Extremities: No edema. No clubbing or cyanosis. DP/PT pulses 2+ bilaterally Neuro: Alert and oriented. No focal deficits. No facial asymmetry. MAE spontaneously. Psych: Responds to questions appropriately with normal affect.    Labs    Chemistry Recent Labs  Lab 02/01/18 2056  02/03/18 0323  02/03/18 2027 02/04/18 0428  02/06/18 0345 02/06/18 2307 02/08/18 0600  NA 141   < > 142   < > 146* 143   < > 147* 147* 143  K 6.6*   < > 6.4*   < > 5.1 5.3*   < > 4.2 3.9 3.6  CL 108   < > 104   < > 107 105   < > 101 97* 96*  CO2 20*   < > 26   < > 26 27   < > 35* 37* 34*  GLUCOSE 258*   < > 172*   < > 174* 150*   < > 88 89 89  BUN 47*   < > 73*   < >  75* 74*   < > 80* 67* 50*  CREATININE 2.53*   < > 3.65*   < > 3.33* 2.88*   < > 1.97* 1.77* 1.29*  CALCIUM 9.4   < > 9.2   < > 9.1 9.0   < > 9.0 9.0 9.7  PROT 6.6  --  7.3  --   --   --   --   --   --   --   ALBUMIN 3.2*   < > 3.5  --  2.9* 2.9*  --   --   --  3.7  AST 36  --  44*  --   --   --   --   --   --   --   ALT 26  --  45*  --   --   --   --   --   --   --   ALKPHOS 59  --  65  --   --   --   --   --   --   --   BILITOT 1.2  --  1.4*  --   --   --   --   --   --   --   GFRNONAA 20*   < > 13*   < > 14* 17*   < > 27* 31* 45*  GFRAA 23*   < > 15*   < > 16* 20*   < > 31* 35* 52*  ANIONGAP 13   < > 12   < > 13 11   < > 11 13 13    < > = values in this interval not displayed.     Hematology Recent Labs  Lab 02/06/18 0345 02/07/18 0322 02/08/18 0600  WBC 12.2* 9.7 7.9  RBC 2.88* 3.20* 3.68*  HGB 8.7* 9.8* 11.1*  HCT 28.3* 31.5* 35.9*  MCV 98.3 98.4 97.6  MCH 30.2 30.6 30.2  MCHC 30.7 31.1 30.9  RDW 16.7* 16.4* 16.1*  PLT 261 285 309    Cardiac  EnzymesNo results for input(s): TROPONINI in the last 168 hours. No results for input(s): TROPIPOC in the last 168 hours.   BNPNo results for input(s): BNP, PROBNP in the last 168 hours.   DDimer No results for input(s): DDIMER in the last 168 hours.   Radiology    No results found.  Telemetry    SR - Personally Reviewed  ECG    No new tracing as of 02/08/18 - Personally Reviewed  Cardiac Studies   TTE: 02/04/2018  - Left ventricle: The cavity size was normal. Wall thickness was normal. Systolic function was vigorous. The estimated ejection fraction was in the range of 65% to 70%. Wall motion was normal; there were no regional wall motion abnormalities. Doppler parameters are consistent with high ventricular filling pressure. - Aortic valve: A mechanical prosthesis was present. There was mild regurgitation. - Mitral valve: A mechanical prosthesis was present. - Left atrium: The atrium was moderately dilated. - Pulmonic valve: Peak gradient (S): 12 mm Hg. - Pulmonary arteries: Systolic pressure was moderately increased. PA peak pressure: 50 mm Hg (S).  Impressions:  - Technically difficult; vigorous LV systolic function; elevated LV filling pressure; s/p AVR with elevated mean gradient of 31 mmHg and moderate AI; s/p MVR with elevated mean gradient 11 mmHg; moderate LAE; moderate pulmonary hypertension; suggest TEE to further assess.  Patient Profile     57 y.o. female with a hx of severe aortic stenosis  and moderate mitral stenosis status post [employer] AVR and MVR for rheumatic heart disease. Also with a hx of postoperative atrial flutter with no recurrence, morbid obesity, diastolic CHF, chronic kidney disease stage III and hypertension, s/p elective laparoscopic sleeve gastrectomy on 01/24/2018, readmitted for acute omental and possible mesenteric hematomas with, significant Hb drop to 6.5.  Assessment & Plan    1.History of aortic and mitral  valve replacement 2016(St. [employer]), on chronic Coumadin admitted s/p gastric surgery with severe mesenteric hematoma: -s/p diagnostic laparoscopy with evacuation of hematoma 02/03/18 with removal of 2L blood with no definitive bleeding source, no evidence of leak or infection.  -Extubated per PCCM 02/05/18>>>doing well on [place]  -Hb 11.1 today 02/08/18 -Hep gtt per pharmacy secondary to MVR/AVR>>continue to monitor closely for active bleeding, Hb and plts -Echo from 02/04/18 with increased gradients across both prosthetic valves mean transaortic 31 mmHg from prior 25 mmHg, mean trans mitral 11 mmHg from prior 8 mmHg>>TEE was recommended  2. Acute on Chronic diastolic CHF: -Weight, 256.3SL today, down from 154.2 on admission  -I&O, net negative 15L since admission  - IV lasix 40 mg to BID decreased 02/08/18 -CXR improved yesterday   3.Acute renal failure: -Creatinine,1.29 today>>improved  -Baseline appears to be the 1.3-1.4 range remotely -Continue to avoid nephrotoxic medications -Per primary team  4.History of postoperative atrial fibrillation/atrial flutter: -NSR, stable  -Continue to monitor -On Hep gttt per pharmacy for AVR/MVR   5.Morbid obesity s/p gastric sleeve surgery today - -s/precent gastric sleeve, discharged on 01/31/2018 with readmission with possible acute infection and acute abdominal bleed on 02/01/2018 -Per primary and surgical teams  7. Hyperkalemia: -Resolved, K+, 3.6  today -Follow with daily BMET  Signed, Kathyrn Drown [name] [facility] Pager: [phone] 02/08/2018, 9:15 AM    For questions or updates, please contact   Please consult [email or web address] for contact info under Cardiology/STEMI.  The patient was seen, examined and discussed with Kathyrn Drown, NP  and I agree with the above.   The patient is doing better, she was transferred out of unit yesterday, she has lost total og 40 lbs, and continues to diurese on decreased dose of lasix 40 mg iv  BID. Its challenging to assess volume status, but she is not significantly fluid overloaded. I would continue the same dose of lasix. Echo from 02/04/18 with increased gradients across both prosthetic valves mean transaortic 31 mmHg from prior 25 mmHg, mean trans mitral 11 mmHg from prior 8 mmHg, mild increase most probably in the settings of sepsis, I would not pursue TEE as she is not a repeat surgical candidate. Continue heparin drip. Hb improving, now 11.1. Crea is significantly improved now 1.29.  Ena Dawley, MD 02/08/2018

## 2018-02-08 NOTE — Progress Notes (Signed)
Assumed care of this patient from previous RN. Agree with prior assessment. Will continue to monitor patient closely.  

## 2018-02-08 NOTE — Progress Notes (Signed)
FC removed at 0915. Pt then able to urinate 150 ml clear, yellow urine. Pt up to chair, attempting to start ambulating with PT in hallway now. Denies pain and discomfort. Will notify bariatric coordinator to educate pt on meal plan. Pt aware.

## 2018-02-08 NOTE — Progress Notes (Signed)
Physical Therapy Treatment Patient Details Name: [name] MRN: [id number] DOB: 03/18/1961 Today's Date: 02/08/2018    History of Present Illness 57 yo female  S/P gastric sleeve and hernia repair on 01/24/18. [place] 9/3 and returned 9/4 with diarrhea, abdominal pain, found to have Significant intraperitoneal and abdominal wall hematomas. Post  laparoscopy for diagnostic and  aspiration-evacuation of hematoma, washout on 9/6. Extubated 02/05/18. patient has significant history prosthetic MVR and AVR, CKD, OSA.    PT Comments    Progressing slowly. Pt is weak and fatigues easily with activity. Will continue to progress activity as tolerated.    Follow Up Recommendations  Home health PT     Equipment Recommendations  (continuing to assess)    Recommendations for Other Services OT consult     Precautions / Restrictions Precautions Precautions: Fall Precaution Comments: chronic 3L O2 [place] Restrictions Weight Bearing Restrictions: No    Mobility  Bed Mobility               General bed mobility comments: oob in recliner  Transfers Overall transfer level: Needs assistance Equipment used: Rolling walker (2 wheeled) Transfers: Sit to/from Stand Sit to Stand: Min guard         General transfer comment: close guard for safety. Increased time. Cues for hand placement.   Ambulation/Gait Ambulation/Gait assistance: Min guard Gait Distance (Feet): 20 Feet Assistive device: Rolling walker (2 wheeled) Gait Pattern/deviations: Step-through pattern;Decreased stride length;Trunk flexed     General Gait Details: Close guard for safety. Slow gait speed. Pt fatigues easily. Remained on [place] O2-6L   Stairs             Wheelchair Mobility    Modified Rankin (Stroke Patients Only)       Balance Overall balance assessment: Needs assistance           Standing balance-Leahy Scale: Fair                              Cognition Arousal/Alertness:  Awake/alert Behavior During Therapy: WFL for tasks assessed/performed Overall Cognitive Status: Within Functional Limits for tasks assessed                                        Exercises      General Comments        Pertinent Vitals/Pain Pain Assessment: No/denies pain    Home Living                      Prior Function            PT Goals (current goals can now be found in the care plan section) Progress towards PT goals: Progressing toward goals    Frequency    Min 3X/week      PT Plan Current plan remains appropriate    Co-evaluation              AM-PAC PT "6 Clicks" Daily Activity  Outcome Measure  Difficulty turning over in bed (including adjusting bedclothes, sheets and blankets)?: A Lot Difficulty moving from lying on back to sitting on the side of the bed? : A Lot Difficulty sitting down on and standing up from a chair with arms (e.g., wheelchair, bedside commode, etc,.)?: A Lot Help needed moving to and from a bed to chair (including a wheelchair)?: A Little  Help needed walking in hospital room?: A Little Help needed climbing 3-5 steps with a railing? : A Lot 6 Click Score: 14    End of Session Equipment Utilized During Treatment: Oxygen Activity Tolerance: Patient limited by fatigue Patient left: in chair;with call bell/phone within reach   PT Visit Diagnosis: Difficulty in walking, not elsewhere classified (R26.2);Muscle weakness (generalized) (M62.81)     Time: 0910-6816 PT Time Calculation (min) (ACUTE ONLY): 19 min  Charges:  $Gait Training: 8-22 mins                        Weston Anna, PT Acute Rehabilitation Services Pager: [phone] Office: [phone]

## 2018-02-08 NOTE — Progress Notes (Signed)
Patient tolerated 1/2 cheese omelet this am.  No nausea, continues to sip fluids.  240 cc fluid documented at this time.  Plans to order cottage cheese for dinner. Aware no fruit/vegtables at this time.  Questions answered.

## 2018-02-08 NOTE — Progress Notes (Addendum)
Patient ID: [name], female   DOB: Aug 21, 1960, 57 y.o.   MRN: [id number]   Progress Note: Metabolic and Bariatric Surgery Service   Chief Complaint/Subjective: No c/o Feels better No n/v Wants something difft to eat. Tired of milk protein Spent time in chair  Objective: Vital signs in last 24 hours: Temp:  [97.6 F (36.4 C)-98.6 F (37 C)] 98.1 F (36.7 C) (09/11 0442) Pulse Rate:  [60-72] 60 (09/11 0442) Resp:  [18-20] 20 (09/11 0442) BP: (126-148)/(55-75) 129/62 (09/11 0442) SpO2:  [95 %-100 %] 97 % (09/11 0442) Weight:  [138.5 kg-138.8 kg] 138.8 kg (09/11 0442) Last BM Date: 02/06/18  Intake/Output from previous day: 09/10 0701 - 09/11 0700 In: 889.9 [P.O.:480; I.V.:409.9] Out: 3250 [Urine:3250] Intake/Output this shift: No intake/output data recorded. Lungs: cta  Cardiovascular: +click,reg  Abd: obese, bruising, incisions ok. Mostly soft  Extremities: chronic RLE lymphedema, no LLE edema  Neuro: nonfocal, alert  Lab Results: CBC  Recent Labs    02/07/18 0322 02/08/18 0600  WBC 9.7 7.9  HGB 9.8* 11.1*  HCT 31.5* 35.9*  PLT 285 309   BMET Recent Labs    02/06/18 2307 02/08/18 0600  NA 147* 143  K 3.9 3.6  CL 97* 96*  CO2 37* 34*  GLUCOSE 89 89  BUN 67* 50*  CREATININE 1.77* 1.29*  CALCIUM 9.0 9.7   PT/INR Recent Labs    02/07/18 0322 02/08/18 0600  LABPROT 14.8 16.4*  INR 1.17 1.34   ABG Recent Labs    02/05/18 0903  PHART 7.380  HCO3 30.2*    Studies/Results:  Anti-infectives: Anti-infectives (From admission, onward)   Start     Dose/Rate Route Frequency Ordered Stop   02/02/18 0800  piperacillin-tazobactam (ZOSYN) IVPB 3.375 g  Status:  Discontinued     3.375 g 12.5 mL/hr over 240 Minutes Intravenous Every 8 hours 02/02/18 0300 02/06/18 1903   02/01/18 2245  piperacillin-tazobactam (ZOSYN) IVPB 3.375 g     3.375 g 100 mL/hr over 30 Minutes Intravenous NOW 02/01/18 2234 02/02/18 0203       Medications: Scheduled Meds: . sodium chloride   Intravenous Once  . acetaminophen  650 mg Oral Q6H  . arformoterol  15 mcg Nebulization BID  . budesonide (PULMICORT) nebulizer solution  0.5 mg Nebulization BID  . buPROPion  300 mg Oral Daily  . citalopram  20 mg Oral Daily  . feeding supplement (PRO-STAT SUGAR FREE 64)  30 mL Oral BID  . fluticasone  2 spray Each Nare Daily  . furosemide  40 mg Intravenous BID  . insulin aspart  0-9 Units Subcutaneous Q4H  . mouth rinse  15 mL Mouth Rinse BID  . metoprolol tartrate  25 mg Oral BID  . pantoprazole (PROTONIX) IV  40 mg Intravenous Daily  . warfarin  5 mg Oral ONCE-1800  . Warfarin - Pharmacist Dosing Inpatient   Does not apply q1800   Continuous Infusions: . heparin 1,500 Units/hr (02/08/18 0711)  . ondansetron (ZOFRAN) IV     PRN Meds:.ipratropium-albuterol, ondansetron (ZOFRAN) IV **OR** ondansetron (ZOFRAN) IV  Assessment/Plan: Patient Active Problem List   Diagnosis Date Noted  . Contraindication to anticoagulation therapy   . Acute diastolic CHF (congestive heart failure), NYHA class 4 ([place])   . Acute on chronic respiratory failure with hypoxia and hypercapnia ([place]) 02/03/2018  . Acute metabolic encephalopathy 24/58/0998  . Fever 02/03/2018  . Leukocytosis 02/03/2018  . Abdominal pain 02/02/2018  . Hyperkalemia 02/02/2018  . Acute kidney injury  superimposed on CKD ([place]) 02/02/2018  . Mesenteric hematoma 02/02/2018  . Hemoperitoneum 02/02/2018  . Anemia   . 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 02/01/2018  . Postoperative anemia due to chronic blood loss on full anticoagulation 02/01/2018  . AKI (acute kidney injury) ([place]) 02/01/2018  . Acute renal failure superimposed on stage 3 chronic kidney disease ([place]) 01/26/2018  . PAF (paroxysmal atrial fibrillation) ([place])   . Acute respiratory failure ([place])   . Hypoxia   . Postprocedural hypotension   . Chronic acquired lymphedema - Right lower extremity 01/24/2018   . Hx of mechanical aortic valve replacement 01/24/2018  . Obstructive sleep apnea 02/03/2017  . Chronic diastolic heart failure ([place]) 01/11/2017  . Morbid obesity with BMI of 50.0-59.9, adult ([place]) 02/11/2016  . HX: long term anticoagulant use 02/11/2016  . Chronic respiratory failure ([place]) 08/12/2015  . 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 Allergic rhinitis 05/02/2015  . Symptomatic anemia 04/08/2015  . History of mitral valve replacement with mechanical valve 04/08/2015  . CKD (chronic kidney disease) stage 3, GFR 30-59 ml/min (HCC) 04/08/2015  . Atrial flutter, unspecified   . Chronic anticoagulation 09/04/2014  . Warfarin anticoagulation 08/14/2014  . ILD (interstitial lung disease) ([place]) 10/12/2013  . Aortic stenosis 10/02/2013  . Hyperlipidemia 10/02/2013  . Chronic asthmatic bronchitis ([place]) 08/06/2013  . Gout 04/16/2011  . Essential hypertension 10/07/2010  . Depressive disorder, not elsewhere classified 10/07/2010   S/p lap sleeve gastrectomy with hiatal hernia repair 01/24/18 Readmitted with AKI on CKD, ABL anemia DIAGNOSTIC LAPAROSCOPY EVACUATION OF HEMATOMA 02/03/2018  Doing well.  -GI - cont bari fulls, ready for next dietary phase, will speak with bari coordinator to see what options are; try chicken soup shake  -FEN - bari fulls, see above, HLIV, lytes ok  -Renal - aki on ckd, good diuresis, Cr trending down, renal signed off; can we stop the scheduled IV lasix, go back to home lasix dose? Especially since getting hemoconcentrated  - CV - BP ok. Cont lopressor  -Mechanical valves - defer repeat echo to cards; started hep gtt sunday, no signs of bleeding, started coumadin 9/9 per pharmacy  -ID - no fever, wbc normal.  All cultures negative to date, ID felt wbc/fever could have been from hemoperitoneum. D/c zosyn 9/9  -Endo - blood sugars ok  -pt eval, OOB to chair today  -pulm - cont home nebs  Disposition:CARDS - can we backoff on some of the lasix  given some evidence of hemoconcentration? Dc foley Ambulate in hall  Disposition:  LOS: 6 days  The patient does not meet criteria for discharge because she has to stay until INR>2  Greer Pickerel, MD [phone] [facility]

## 2018-02-09 LAB — RENAL FUNCTION PANEL
ALBUMIN: 3.2 g/dL — AB (ref 3.5–5.0)
ANION GAP: 13 (ref 5–15)
BUN: 42 mg/dL — ABNORMAL HIGH (ref 6–20)
CO2: 30 mmol/L (ref 22–32)
Calcium: 9.2 mg/dL (ref 8.9–10.3)
Chloride: 98 mmol/L (ref 98–111)
Creatinine, Ser: 1.14 mg/dL — ABNORMAL HIGH (ref 0.44–1.00)
GFR calc non Af Amer: 52 mL/min — ABNORMAL LOW (ref >60)
GLUCOSE: 90 mg/dL (ref 70–99)
PHOSPHORUS: 2.5 mg/dL (ref 2.5–4.6)
Potassium: 3.5 mmol/L (ref 3.5–5.1)
Sodium: 141 mmol/L (ref 135–145)

## 2018-02-09 LAB — CBC
HCT: 32.7 % — ABNORMAL LOW (ref 36.0–46.0)
Hemoglobin: 10.1 g/dL — ABNORMAL LOW (ref 12.0–15.0)
MCH: 30.1 pg (ref 26.0–34.0)
MCHC: 30.9 g/dL (ref 30.0–36.0)
MCV: 97.3 fL (ref 78.0–100.0)
PLATELETS: 280 10*3/uL (ref 150–400)
RBC: 3.36 MIL/uL — ABNORMAL LOW (ref 3.87–5.11)
RDW: 16.1 % — AB (ref 11.5–15.5)
WBC: 6.7 10*3/uL (ref 4.0–10.5)

## 2018-02-09 LAB — HEPARIN LEVEL (UNFRACTIONATED)
HEPARIN UNFRACTIONATED: 0.36 [IU]/mL (ref 0.30–0.70)
Heparin Unfractionated: 0.29 IU/mL — ABNORMAL LOW (ref 0.30–0.70)

## 2018-02-09 LAB — GLUCOSE, CAPILLARY
GLUCOSE-CAPILLARY: 83 mg/dL (ref 70–99)
GLUCOSE-CAPILLARY: 87 mg/dL (ref 70–99)
GLUCOSE-CAPILLARY: 88 mg/dL (ref 70–99)
GLUCOSE-CAPILLARY: 88 mg/dL (ref 70–99)
GLUCOSE-CAPILLARY: 90 mg/dL (ref 70–99)
Glucose-Capillary: 94 mg/dL (ref 70–99)
Glucose-Capillary: 97 mg/dL (ref 70–99)
Glucose-Capillary: 99 mg/dL (ref 70–99)

## 2018-02-09 LAB — PROTIME-INR
INR: 1.48
Prothrombin Time: 17.8 seconds — ABNORMAL HIGH (ref 11.4–15.2)

## 2018-02-09 LAB — MAGNESIUM: Magnesium: 2.1 mg/dL (ref 1.7–2.4)

## 2018-02-09 LAB — PHOSPHORUS: Phosphorus: 2.5 mg/dL (ref 2.5–4.6)

## 2018-02-09 MED ORDER — WARFARIN SODIUM 5 MG PO TABS
5.0000 mg | ORAL_TABLET | Freq: Once | ORAL | Status: AC
  Administered 2018-02-09: 5 mg via ORAL
  Filled 2018-02-09: qty 1

## 2018-02-09 MED ORDER — HEPARIN (PORCINE) IN NACL 100-0.45 UNIT/ML-% IJ SOLN
1550.0000 [IU]/h | INTRAMUSCULAR | Status: DC
  Administered 2018-02-10: 1550 [IU]/h via INTRAVENOUS
  Filled 2018-02-09 (×2): qty 250

## 2018-02-09 NOTE — Progress Notes (Signed)
Patient ID: [name], female   DOB: 04/26/1961, 57 y.o.   MRN: [id number]   Progress Note: Metabolic and Bariatric Surgery Service   Chief Complaint/Subjective: No c/o No n/v Tolerated soft protein diet   Objective: Vital signs in last 24 hours: Temp:  [97.6 F (36.4 C)-98.7 F (37.1 C)] 97.6 F (36.4 C) (09/12 0412) Pulse Rate:  [63-70] 63 (09/12 0412) Resp:  [18-20] 18 (09/12 0412) BP: (107-134)/(56-90) 107/56 (09/12 0412) SpO2:  [93 %-100 %] 93 % (09/12 0812) Weight:  [136.4 kg] 136.4 kg (09/12 0412) Last BM Date: 02/09/18  Intake/Output from previous day: 09/11 0701 - 09/12 0700 In: 730.4 [P.O.:510; I.V.:220.4] Out: 1450 [Urine:1450] Intake/Output this shift: No intake/output data recorded.  Lungs: cta  Cardiovascular: reg; +click  Abd: bruising; soft, NT, incisions ok  Extremities:  Chronic RLE lymphedema; no LLE edema  Neuro: nonfocal, alert, sitting in chair  Lab Results: CBC  Recent Labs    02/08/18 0600 02/09/18 0505  WBC 7.9 6.7  HGB 11.1* 10.1*  HCT 35.9* 32.7*  PLT 309 280   BMET Recent Labs    02/08/18 0600 02/09/18 0505  NA 143 141  K 3.6 3.5  CL 96* 98  CO2 34* 30  GLUCOSE 89 90  BUN 50* 42*  CREATININE 1.29* 1.14*  CALCIUM 9.7 9.2   PT/INR Recent Labs    02/08/18 0600 02/09/18 0505  LABPROT 16.4* 17.8*  INR 1.34 1.48   ABG No results for input(s): PHART, HCO3 in the last 72 hours.  Invalid input(s): PCO2, PO2  Studies/Results:  Anti-infectives: Anti-infectives (From admission, onward)   Start     Dose/Rate Route Frequency Ordered Stop   02/02/18 0800  piperacillin-tazobactam (ZOSYN) IVPB 3.375 g  Status:  Discontinued     3.375 g 12.5 mL/hr over 240 Minutes Intravenous Every 8 hours 02/02/18 0300 02/06/18 1903   02/01/18 2245  piperacillin-tazobactam (ZOSYN) IVPB 3.375 g     3.375 g 100 mL/hr over 30 Minutes Intravenous NOW 02/01/18 2234 02/02/18 0203      Medications: Scheduled Meds: . sodium  chloride   Intravenous Once  . acetaminophen  650 mg Oral Q6H  . arformoterol  15 mcg Nebulization BID  . budesonide (PULMICORT) nebulizer solution  0.5 mg Nebulization BID  . buPROPion  300 mg Oral Daily  . citalopram  20 mg Oral Daily  . fluticasone  2 spray Each Nare Daily  . furosemide  40 mg Intravenous BID  . insulin aspart  0-9 Units Subcutaneous Q4H  . mouth rinse  15 mL Mouth Rinse BID  . metoprolol tartrate  25 mg Oral BID  . pantoprazole (PROTONIX) IV  40 mg Intravenous Daily  . Warfarin - Pharmacist Dosing Inpatient   Does not apply q1800   Continuous Infusions: . heparin    . ondansetron (ZOFRAN) IV     PRN Meds:.ipratropium-albuterol, ondansetron (ZOFRAN) IV **OR** ondansetron (ZOFRAN) IV  Assessment/Plan: Patient Active Problem List   Diagnosis Date Noted  . Contraindication to anticoagulation therapy   . Acute diastolic CHF (congestive heart failure), NYHA class 4 ([place] [address])   . Acute on chronic respiratory failure with hypoxia and hypercapnia ([place]) 02/03/2018  . Acute metabolic encephalopathy 59/56/3875  . Fever 02/03/2018  . Leukocytosis 02/03/2018  . Abdominal pain 02/02/2018  . Hyperkalemia 02/02/2018  . Acute kidney injury superimposed on CKD ([place]) 02/02/2018  . Mesenteric hematoma 02/02/2018  . Hemoperitoneum 02/02/2018  . Anemia   . 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 02/01/2018  .  Postoperative anemia due to chronic blood loss on full anticoagulation 02/01/2018  . AKI (acute kidney injury) ([place]) 02/01/2018  . Acute renal failure superimposed on stage 3 chronic kidney disease ([place]) 01/26/2018  . PAF (paroxysmal atrial fibrillation) ([place])   . Acute respiratory failure ([place])   . Hypoxia   . Postprocedural hypotension   . Chronic acquired lymphedema - Right lower extremity 01/24/2018  . Hx of mechanical aortic valve replacement 01/24/2018  . Obstructive sleep apnea 02/03/2017  . Chronic diastolic heart failure ([place]) 01/11/2017  . Morbid obesity with  BMI of 50.0-59.9, adult ([place]) 02/11/2016  . HX: long term anticoagulant use 02/11/2016  . Chronic respiratory failure ([place]) 08/12/2015  . 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 Allergic rhinitis 05/02/2015  . Symptomatic anemia 04/08/2015  . History of mitral valve replacement with mechanical valve 04/08/2015  . CKD (chronic kidney disease) stage 3, GFR 30-59 ml/min (HCC) 04/08/2015  . Atrial flutter, unspecified   . Chronic anticoagulation 09/04/2014  . Warfarin anticoagulation 08/14/2014  . ILD (interstitial lung disease) ([place]) 10/12/2013  . Aortic stenosis 10/02/2013  . Hyperlipidemia 10/02/2013  . Chronic asthmatic bronchitis ([place]) 08/06/2013  . Gout 04/16/2011  . Essential hypertension 10/07/2010  . Depressive disorder, not elsewhere classified 10/07/2010   S/p lap sleeve gastrectomy with hiatal hernia repair 01/24/18 Readmitted with AKI on CKD, ABL anemia DIAGNOSTIC LAPAROSCOPY EVACUATION OF HEMATOMA 02/03/2018  Doing well.  -GI -contmodified soft diet (high protein, no carbs)  -FEN - see above, see above, HLIV, lytes ok  -Renal - aki on ckd - resolved, good diuresis, Cr nml, renal signed off;bid lasix now  - CV - BPok. Cont lopressor  -Mechanical valves - defer repeat echo to cards; started hep gttsunday, no signs of bleeding, startedcoumadin 9/9per pharmacy  -ID - no fever, wbc normal.  All cultures negative to date, ID felt wbc/fever could have been from hemoperitoneum.D/c zosyn 9/9  -Endo - blood sugars ok; if nml another 24hrs will stop SSI  -pt eval, OOB to chair today  -pulm - cont home nebs  Disposition:awaiting therapeutic INR Ambulate in hall    LOS: 7 days  Greer Pickerel, MD [phone] [facility]

## 2018-02-09 NOTE — Progress Notes (Addendum)
Progress Note  Patient Name: [name] Date of Encounter: 02/09/2018  Primary Cardiologist: Sinclair Grooms, MD  Subjective   Doing well this AM. Pt up to chair. Denies chest pain or palpitations.   Inpatient Medications    Scheduled Meds: . sodium chloride   Intravenous Once  . acetaminophen  650 mg Oral Q6H  . arformoterol  15 mcg Nebulization BID  . budesonide (PULMICORT) nebulizer solution  0.5 mg Nebulization BID  . buPROPion  300 mg Oral Daily  . citalopram  20 mg Oral Daily  . fluticasone  2 spray Each Nare Daily  . furosemide  40 mg Intravenous BID  . insulin aspart  0-9 Units Subcutaneous Q4H  . mouth rinse  15 mL Mouth Rinse BID  . metoprolol tartrate  25 mg Oral BID  . pantoprazole (PROTONIX) IV  40 mg Intravenous Daily  . Warfarin - Pharmacist Dosing Inpatient   Does not apply q1800   Continuous Infusions: . heparin 1,550 Units/hr (02/09/18 0911)  . ondansetron (ZOFRAN) IV     PRN Meds: ipratropium-albuterol, ondansetron (ZOFRAN) IV **OR** ondansetron (ZOFRAN) IV   Vital Signs    Vitals:   02/08/18 1935 02/08/18 2030 02/09/18 0412 02/09/18 0812  BP:  132/90 (!) 107/56   Pulse:  70 63   Resp:  20 18   Temp:  98.7 F (37.1 C) 97.6 F (36.4 C)   TempSrc:  Oral    SpO2: 97% 100% 97% 93%  Weight:   (!) 136.4 kg   Height:        Intake/Output Summary (Last 24 hours) at 02/09/2018 0944 Last data filed at 02/09/2018 0100 Gross per 24 hour  Intake 730.39 ml  Output 850 ml  Net -119.61 ml   Filed Weights   02/07/18 1715 02/08/18 0442 02/09/18 0412  Weight: (!) 138.5 kg (!) 138.8 kg (!) 136.4 kg   Physical Exam   General: Obese, NAD Skin: Warm, dry, intact  Head: Normocephalic, atraumatic, clear, moist mucus membranes. Neck: Negative for carotid bruits. No JVD Lungs:Clear to ausculation bilaterally. No wheezes, rales, or rhonchi. Breathing is unlabored. Cardiovascular: RRR with S1 S2. No murmurs, rubs, gallops, or LV heave  appreciated. Abdomen: Soft, non-tender, non-distended with normoactive bowel sounds. No obvious abdominal masses. MSK: Strength and tone appear normal for age. 5/5 in all extremities Extremities: No edema. No clubbing or cyanosis. DP/PT pulses 2+ bilaterally Neuro: Alert and oriented. No focal deficits. No facial asymmetry. MAE spontaneously. Psych: Responds to questions appropriately with normal affect.    Labs    Chemistry Recent Labs  Lab 02/03/18 0323  02/04/18 0428  02/06/18 2307 02/08/18 0600 02/09/18 0505  NA 142   < > 143   < > 147* 143 141  K 6.4*   < > 5.3*   < > 3.9 3.6 3.5  CL 104   < > 105   < > 97* 96* 98  CO2 26   < > 27   < > 37* 34* 30  GLUCOSE 172*   < > 150*   < > 89 89 90  BUN 73*   < > 74*   < > 67* 50* 42*  CREATININE 3.65*   < > 2.88*   < > 1.77* 1.29* 1.14*  CALCIUM 9.2   < > 9.0   < > 9.0 9.7 9.2  PROT 7.3  --   --   --   --   --   --  ALBUMIN 3.5   < > 2.9*  --   --  3.7 3.2*  AST 44*  --   --   --   --   --   --   ALT 45*  --   --   --   --   --   --   ALKPHOS 65  --   --   --   --   --   --   BILITOT 1.4*  --   --   --   --   --   --   GFRNONAA 13*   < > 17*   < > 31* 45* 52*  GFRAA 15*   < > 20*   < > 35* 52* >60  ANIONGAP 12   < > 11   < > 13 13 13    < > = values in this interval not displayed.     Hematology Recent Labs  Lab 02/07/18 0322 02/08/18 0600 02/09/18 0505  WBC 9.7 7.9 6.7  RBC 3.20* 3.68* 3.36*  HGB 9.8* 11.1* 10.1*  HCT 31.5* 35.9* 32.7*  MCV 98.4 97.6 97.3  MCH 30.6 30.2 30.1  MCHC 31.1 30.9 30.9  RDW 16.4* 16.1* 16.1*  PLT 285 309 280   Cardiac EnzymesNo results for input(s): TROPONINI in the last 168 hours. No results for input(s): TROPIPOC in the last 168 hours.   BNPNo results for input(s): BNP, PROBNP in the last 168 hours.   DDimer No results for input(s): DDIMER in the last 168 hours.   Radiology    No results found.  Telemetry    02/09/18 NSR - Personally Reviewed  ECG    No new tracing as of  02/09/18 - Personally Reviewed  Cardiac Studies   TTE: 02/04/2018  - Left ventricle: The cavity size was normal. Wall thickness was normal. Systolic function was vigorous. The estimated ejection fraction was in the range of 65% to 70%. Wall motion was normal; there were no regional wall motion abnormalities. Doppler parameters are consistent with high ventricular filling pressure. - Aortic valve: A mechanical prosthesis was present. There was mild regurgitation. - Mitral valve: A mechanical prosthesis was present. - Left atrium: The atrium was moderately dilated. - Pulmonic valve: Peak gradient (S): 12 mm Hg. - Pulmonary arteries: Systolic pressure was moderately increased. PA peak pressure: 50 mm Hg (S).  Impressions:  - Technically difficult; vigorous LV systolic function; elevated LV filling pressure; s/p AVR with elevated mean gradient of 31 mmHg and moderate AI; s/p MVR with elevated mean gradient 11 mmHg; moderate LAE; moderate pulmonary hypertension; suggest TEE to further assess.  Patient Profile     57 y.o. 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 Also with a hx of postoperative atrial flutter with no recurrence, morbid obesity, diastolic CHF, chronic kidney disease stage III and hypertension, s/p elective laparoscopic sleeve gastrectomy on 01/24/2018, readmitted for acute omental and possible mesenteric hematomas with, significant Hb drop to 6.5.  Assessment & Plan    1.History of aortic and mitral valve replacement 2016(St. Jude), on chronic Coumadin admitted s/p gastric surgery with severe mesenteric hematoma: -s/p diagnostic laparoscopy with evacuation of hematoma 02/03/18 with removal of 2L blood with no definitive bleeding source, no evidence of leak or infection.  -Extubated per PCCM 02/05/18>>>doing well on [place]>>tranferered to regular floor    -Hb10.1today 02/09/18>>down from 11.1 yesterday   -Hep gtt per  pharmacy secondary to MVR/AVR>>continue to monitor closely for active bleeding, Hb and plts -Echo from 02/04/18 with increased gradients across both prosthetic valves mean transaortic 31 mmHg from prior 25 mmHg, mean trans mitral 11 mmHg from prior 8 mmHg>>TEE was recommended>>not to pursue as she is not a surgical candidate   2. Acute onChronic diastolic CHF: -LTJQZE,092.3RA today, down from 154.2 on admission  -I&O, net negative 16L since admission  - IV lasix 40 mg to BID decreased 02/08/18>>continue  -CXR improved yesterday   3.Acute renal failure: -Creatinine,1.14 today>>improving   -Baseline appears to be the 1.3-1.4 range remotely -Continue to avoid nephrotoxic medications -Per primary team  4.History of postoperative atrial fibrillation/atrial flutter: -NSR, stable  -Continue to monitor -On Hep gttt per pharmacy for AVR/MVR  -INR, 1.48 today   5.Morbid obesity s/p gastric sleeve surgery today - -s/precent gastric sleeve, discharged on 01/31/2018 with readmission with possible acute infection and acute abdominal bleed on 02/01/2018 -Per primary and surgical teams  7. Hyperkalemia: -Resolved, K+,3.5today -Follow with daily BMET   Signed, Kathyrn Drown [name] [facility] Pager: [phone] 02/09/2018, 9:44 AM     For questions or updates, please contact   Please consult [email or web address] for contact info under Cardiology/STEMI.  The patient was seen, examined and discussed with Kathyrn Drown, NP  and I agree with the above.   The patient is doing better, she continues to diuresis, now down 42 pounds.  I will switch Lasix to p.o. twice daily, creatinine is further improving now 1.1.  Echo from 02/04/18 with increased gradients across both prosthetic valves mean transaortic 31 mmHg from prior 25 mmHg, mean trans mitral 11 mmHg from prior 8 mmHg, mild increase most probably in the settings of  sepsis, I would not pursue TEE as she is not a repeat surgical candidate.  Hb improving, now 10.1. Crea is significantly improved now 1.29.  Consider transitioning heparin to warfarin.  We will sign off, please call us with any questions.  Ena Dawley, MD 02/09/2018

## 2018-02-09 NOTE — Progress Notes (Signed)
Brief Pharmacy Note re: IV heparin  O: Heparin level 0.36 (goal 0.3-0.5) on 1550 units/hr      No bleeding or infusion related issues reported by RN  A/P:   Heparin therapeutic.  Cont current rate.    F/U morning labs.   Netta Cedars, PharmD, BCPS 02/09/2018@4 :58 PM
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
## 2018-02-09 NOTE — Progress Notes (Signed)
Counseled patient on high protein soft diet.  Menus discussed for today.  Patient had 3.5 ounces of cottage cheese.  We discussed fruit cocktail as an option later in diet stages but at this time the focus on increasing protein.  Patient states understanding.  Questions answered

## 2018-02-09 NOTE — Care Management Note (Signed)
Case Management Note  Patient Details  Name: [name] MRN: [id number] Date of Birth: 05-08-1961  Subjective/Objective: PT recc HHPT. Patient [facility]-rep Santiago Glad aware. Await final [place] orders.No further CM needs.                   Action/Plan:d/c home w/HHC.   Expected Discharge Date:                  Expected Discharge Plan:  Home/Self Care  In-House Referral:     Discharge planning Services  CM Consult  Post Acute Care Choice:  (S) Durable Medical Equipment(home oxygen-[employer]) Choice offered to:  Patient  DME Arranged:    DME Agency:     HH Arranged:    [place] Agency:     Status of Service:  In process, will continue to follow  If discussed at Long Length of Stay Meetings, dates discussed:    Additional Comments:  Dessa Phi, RN 02/09/2018, 2:16 PM

## 2018-02-09 NOTE — Progress Notes (Signed)
Physical Therapy Treatment Patient Details Name: [name] MRN: [id number] DOB: 1960-11-18 Today's Date: 02/09/2018    History of Present Illness 57 yo female  S/P gastric sleeve and hernia repair on 01/24/18. [place] 9/3 and returned 9/4 with diarrhea, abdominal pain, found to have Significant intraperitoneal and abdominal wall hematomas. Post  laparoscopy for diagnostic and  aspiration-evacuation of hematoma, washout on 9/6. Extubated 02/05/18. patient has significant history prosthetic MVR and AVR, CKD, OSA.    PT Comments    Progressing slowly with mobility.    Follow Up Recommendations  Home health PT     Equipment Recommendations  Rolling walker with 5" wheels    Recommendations for Other Services OT consult     Precautions / Restrictions Precautions Precautions: Fall Precaution Comments: chronic 3L O2 [place] Restrictions Weight Bearing Restrictions: No    Mobility  Bed Mobility               General bed mobility comments: sitting EOB  Transfers Overall transfer level: Needs assistance Equipment used: 4-wheeled walker(bari) Transfers: Sit to/from Stand Sit to Stand: Min guard         General transfer comment: close guard for safety. Increased time. Cues for hand placement, operation of rollator  Ambulation/Gait Ambulation/Gait assistance: Min guard Gait Distance (Feet): 35 Feet(x2) Assistive device: 4-wheeled walker(bari) Gait Pattern/deviations: Step-through pattern;Decreased stride length     General Gait Details: Close guard for safety. Slow gait speed. O2 sat 94% on 4L [place] O2. Seated rest break taken/needed.    Stairs             Wheelchair Mobility    Modified Rankin (Stroke Patients Only)       Balance                                            Cognition Arousal/Alertness: Awake/alert Behavior During Therapy: WFL for tasks assessed/performed Overall Cognitive Status: Within Functional Limits for tasks  assessed                                        Exercises      General Comments        Pertinent Vitals/Pain Pain Assessment: No/denies pain    Home Living                      Prior Function            PT Goals (current goals can now be found in the care plan section) Progress towards PT goals: Progressing toward goals    Frequency    Min 3X/week      PT Plan Current plan remains appropriate    Co-evaluation              AM-PAC PT "6 Clicks" Daily Activity  Outcome Measure  Difficulty turning over in bed (including adjusting bedclothes, sheets and blankets)?: A Lot Difficulty moving from lying on back to sitting on the side of the bed? : A Lot Difficulty sitting down on and standing up from a chair with arms (e.g., wheelchair, bedside commode, etc,.)?: A Little Help needed moving to and from a bed to chair (including a wheelchair)?: A Little Help needed walking in hospital room?: A Little Help needed climbing 3-5 steps with a  railing? : A Lot 6 Click Score: 15    End of Session Equipment Utilized During Treatment: Oxygen Activity Tolerance: Patient limited by fatigue Patient left: in chair;with call bell/phone within reach   PT Visit Diagnosis: Difficulty in walking, not elsewhere classified (R26.2);Muscle weakness (generalized) (M62.81)     Time: 9030-0923 PT Time Calculation (min) (ACUTE ONLY): 13 min  Charges:  $Gait Training: 8-22 mins                        Weston Anna, PT Acute Rehabilitation Services Pager: [phone] Office: [phone]

## 2018-02-10 LAB — GLUCOSE, CAPILLARY
GLUCOSE-CAPILLARY: 92 mg/dL (ref 70–99)
Glucose-Capillary: 87 mg/dL (ref 70–99)
Glucose-Capillary: 86 mg/dL (ref 70–99)

## 2018-02-10 LAB — CBC
HCT: 33.9 % — ABNORMAL LOW (ref 36.0–46.0)
HEMOGLOBIN: 10.2 g/dL — AB (ref 12.0–15.0)
MCH: 29.1 pg (ref 26.0–34.0)
MCHC: 30.1 g/dL (ref 30.0–36.0)
MCV: 96.9 fL (ref 78.0–100.0)
Platelets: 288 10*3/uL (ref 150–400)
RBC: 3.5 MIL/uL — AB (ref 3.87–5.11)
RDW: 16.1 % — ABNORMAL HIGH (ref 11.5–15.5)
WBC: 5.8 10*3/uL (ref 4.0–10.5)

## 2018-02-10 LAB — RENAL FUNCTION PANEL
ANION GAP: 12 (ref 5–15)
Albumin: 3.7 g/dL (ref 3.5–5.0)
BUN: 41 mg/dL — ABNORMAL HIGH (ref 6–20)
CALCIUM: 9.6 mg/dL (ref 8.9–10.3)
CO2: 32 mmol/L (ref 22–32)
Chloride: 102 mmol/L (ref 98–111)
Creatinine, Ser: 1.22 mg/dL — ABNORMAL HIGH (ref 0.44–1.00)
GFR calc Af Amer: 55 mL/min — ABNORMAL LOW (ref >60)
GFR calc non Af Amer: 48 mL/min — ABNORMAL LOW (ref >60)
GLUCOSE: 94 mg/dL (ref 70–99)
PHOSPHORUS: 2.5 mg/dL (ref 2.5–4.6)
POTASSIUM: 3.5 mmol/L (ref 3.5–5.1)
SODIUM: 146 mmol/L — AB (ref 135–145)

## 2018-02-10 LAB — PROTIME-INR
INR: 1.57
PROTHROMBIN TIME: 18.6 s — AB (ref 11.4–15.2)

## 2018-02-10 LAB — HEPARIN LEVEL (UNFRACTIONATED)
Heparin Unfractionated: 0.28 IU/mL — ABNORMAL LOW (ref 0.30–0.70)
Heparin Unfractionated: 0.32 IU/mL (ref 0.30–0.70)

## 2018-02-10 MED ORDER — WARFARIN SODIUM 5 MG PO TABS
7.5000 mg | ORAL_TABLET | Freq: Once | ORAL | Status: AC
  Administered 2018-02-10: 7.5 mg via ORAL
  Filled 2018-02-10: qty 1

## 2018-02-10 MED ORDER — HEPARIN (PORCINE) IN NACL 100-0.45 UNIT/ML-% IJ SOLN
1600.0000 [IU]/h | INTRAMUSCULAR | Status: DC
  Administered 2018-02-10 – 2018-02-12 (×3): 1600 [IU]/h via INTRAVENOUS
  Filled 2018-02-10 (×3): qty 250

## 2018-02-10 NOTE — Progress Notes (Signed)
Brief Pharmacy Note re: IV heparin  O: Heparin level 0.32 (goal 0.3-0.5) on 1600 units/hr      No bleeding or infusion related issues reported by RN  A/P:   Heparin therapeutic.  Cont current rate.    F/U morning labs.   Netta Cedars, PharmD, BCPS 02/10/2018@4 :54 PM

## 2018-02-10 NOTE — Progress Notes (Signed)
Patient sleeping when rounded.  Spoke with bedside RN, Lesly Rubenstein who said patient had been up back and forth to [facility] most of morning.  We discussed strict I&O needed for patient.  Left a patient self monitoring tool to assist in capturing fluids drank.  Educated nurse and left contact information so I can come educate patient to tool as well.

## 2018-02-10 NOTE — Progress Notes (Signed)
Physical Therapy Treatment Patient Details Name: [name] MRN: [id number] DOB: 08/04/1960 Today's Date: 02/10/2018    History of Present Illness 57 yo female  S/P gastric sleeve and hernia repair on 01/24/18. [place] 9/3 and returned 9/4 with diarrhea, abdominal pain, found to have Significant intraperitoneal and abdominal wall hematomas. Post  laparoscopy for diagnostic and  aspiration-evacuation of hematoma, washout on 9/6. Extubated 02/05/18. patient has significant history prosthetic MVR and AVR, CKD, OSA.    PT Comments    Pt participates well. She self limits her ambulation distance. Recommend daily ambulation in hallway with nursing assistance in addition to PT sessions to increase activity.   Follow Up Recommendations  Home health PT     Equipment Recommendations  Rolling walker with 5" wheels    Recommendations for Other Services       Precautions / Restrictions Precautions Precautions: Fall Precaution Comments: chronic 3L O2 [place] Restrictions Weight Bearing Restrictions: No    Mobility  Bed Mobility               General bed mobility comments: sitting on BSC  Transfers Overall transfer level: Needs assistance Equipment used: 4-wheeled walker Transfers: Sit to/from Stand Sit to Stand: Min guard Stand pivot transfers: Min guard       General transfer comment: close guard for safety. Increased time. Cues for hand placement, operation of rollator  Ambulation/Gait Ambulation/Gait assistance: Min guard Gait Distance (Feet): 30 Feet(x2) Assistive device: 4-wheeled walker Gait Pattern/deviations: Step-through pattern;Decreased stride length     General Gait Details: Close guard for safety. Slow gait speed. O2 sat 90% on 4L [place] O2. Seated rest break taken/needed. Pt self-limits distance   Stairs             Wheelchair Mobility    Modified Rankin (Stroke Patients Only)       Balance                                             Cognition Arousal/Alertness: Awake/alert Behavior During Therapy: WFL for tasks assessed/performed Overall Cognitive Status: Within Functional Limits for tasks assessed                                        Exercises      General Comments        Pertinent Vitals/Pain Pain Assessment: No/denies pain    Home Living                      Prior Function            PT Goals (current goals can now be found in the care plan section) Progress towards PT goals: Progressing toward goals    Frequency    Min 3X/week      PT Plan Current plan remains appropriate    Co-evaluation              AM-PAC PT "6 Clicks" Daily Activity  Outcome Measure  Difficulty turning over in bed (including adjusting bedclothes, sheets and blankets)?: A Lot Difficulty moving from lying on back to sitting on the side of the bed? : A Lot Difficulty sitting down on and standing up from a chair with arms (e.g., wheelchair, bedside commode, etc,.)?: A Little Help needed moving to  and from a bed to chair (including a wheelchair)?: A Little Help needed walking in hospital room?: A Little Help needed climbing 3-5 steps with a railing? : A Lot 6 Click Score: 15    End of Session Equipment Utilized During Treatment: Oxygen Activity Tolerance: Patient limited by fatigue Patient left: in chair;with call bell/phone within reach   PT Visit Diagnosis: Difficulty in walking, not elsewhere classified (R26.2);Muscle weakness (generalized) (M62.81)     Time: 4818-5909 PT Time Calculation (min) (ACUTE ONLY): 22 min  Charges:  $Gait Training: 8-22 mins                        Weston Anna, PT Acute Rehabilitation Services Pager: [phone] Office: [phone]

## 2018-02-10 NOTE — Evaluation (Addendum)
Occupational Therapy Evaluation Patient Details Name: [name] MRN: [id number] DOB: 1960/07/17 Today's Date: 02/10/2018    History of Present Illness 57 yo female  S/P gastric sleeve and hernia repair on 01/24/18. [place] 9/3 and returned 9/4 with diarrhea, abdominal pain, found to have Significant intraperitoneal and abdominal wall hematomas. Post  laparoscopy for diagnostic and  aspiration-evacuation of hematoma, washout on 9/6. Extubated 02/05/18. patient has significant history prosthetic MVR and AVR, CKD, OSA.   Clinical Impression   Pt was recently seen by this OT on last admission. Will follow her in acute setting to build endurance and increase independence with adls.  Goals are for supervision to mod I levels.    Follow Up Recommendations  Supervision - Intermittent    Equipment Recommendations  None recommended by OT    Recommendations for Other Services       Precautions / Restrictions Precautions Precautions: Fall Precaution Comments: chronic 3L O2 [place] Restrictions Weight Bearing Restrictions: No      Mobility Bed Mobility               General bed mobility comments: sitting EOB  Transfers Overall transfer level: Needs assistance Equipment used: 4-wheeled walker Transfers: Sit to/from Stand Sit to Stand: Min guard Stand pivot transfers: Min guard       General transfer comment: min guard with PT:  not performed during OT as pt states stomach just calmed down    Balance                                           ADL either performed or assessed with clinical judgement   ADL               Lower Body Bathing: Min guard;Sit to/from stand       Lower Body Dressing: Min guard;Sit to/from stand                 General ADL Comments: pt now able to bring each leg up on bed to reach to feet. Pt was home for less than a day; states she was able to shower (in tub) and perform ADL. She is having the most difficulty with  toilet hygiene.  Gave her resource sheet on toilet aide models that might help.   Upon returning to office, noted that she had medicaid as secondary insurance:  I am providing her with a toilet aide as we have a limited supply we can issue to those pts with limited resources.     Vision         Perception     Praxis      Pertinent Vitals/Pain Pain Assessment: No/denies pain     Hand Dominance     Extremity/Trunk Assessment Upper Extremity Assessment Upper Extremity Assessment: Generalized weakness           Communication Communication Communication: No difficulties   Cognition Arousal/Alertness: Awake/alert Behavior During Therapy: WFL for tasks assessed/performed Overall Cognitive Status: Within Functional Limits for tasks assessed                                     General Comments  VSS    Exercises     Shoulder Instructions      Home Living Family/patient expects to be discharged to:: Private residence Living  Arrangements: Other relatives Available Help at Discharge: Family               Bathroom Shower/Tub: Teacher, early years/pre: Standard Bathroom Accessibility: No       Additional Comments: chronic oxygen      Prior Functioning/Environment Level of Independence: Independent        Comments: uses 3 liters        OT Problem List: Decreased strength;Decreased activity tolerance;Decreased knowledge of use of DME or AE      OT Treatment/Interventions: Self-care/ADL training;DME and/or AE instruction;Patient/family education;Therapeutic activities;Energy conservation    OT Goals(Current goals can be found in the care plan section) Acute Rehab OT Goals Patient Stated Goal: home OT Goal Formulation: With patient Time For Goal Achievement: 02/24/18 Potential to Achieve Goals: Good ADL Goals Pt Will Transfer to Toilet: with modified independence;regular height toilet;ambulating Additional ADL Goal #1: pt will  initiate at least one rest break during adl and perform with supervision, including setting herself up  OT Frequency: Min 2X/week   Barriers to D/C:            Co-evaluation              AM-PAC PT "6 Clicks" Daily Activity     Outcome Measure Help from another person eating meals?: None Help from another person taking care of personal grooming?: A Little Help from another person toileting, which includes using toliet, bedpan, or urinal?: A Little Help from another person bathing (including washing, rinsing, drying)?: A Little Help from another person to put on and taking off regular upper body clothing?: A Little Help from another person to put on and taking off regular lower body clothing?: A Little 6 Click Score: 19   End of Session    Activity Tolerance:   Patient left: (EOB with call bell)  OT Visit Diagnosis: Muscle weakness (generalized) (M62.81)                Time: 6837-2902 OT Time Calculation (min): 13 min Charges:  OT General Charges $OT Visit: 1 Visit OT Evaluation $OT Eval Low Complexity: [place], OTR/L Acute Rehabilitation Services [phone] [facility] pager [phone] office 02/10/2018  [place] 02/10/2018, 4:13 PM

## 2018-02-10 NOTE — Progress Notes (Signed)
Patient ID: [name], female   DOB: [birth year], 57 y.o.   MRN: [id number]   Progress Note: Metabolic and Bariatric Surgery Service   Chief Complaint/Subjective: No c/o Wants to go home Spent time in chair Tolerating soft protein diet +Bms  Objective: Vital signs in last 24 hours: Temp:  [98 F (36.7 C)-98.3 F (36.8 C)] 98 F (36.7 C) (09/13 0439) Pulse Rate:  [65-69] 68 (09/13 0439) Resp:  [16-20] 20 (09/13 0439) BP: (119-129)/(62-70) 119/62 (09/13 0439) SpO2:  [94 %-99 %] 99 % (09/13 0439) Weight:  [135.9 kg] 135.9 kg (09/13 0439) Last BM Date: 02/09/18  Intake/Output from previous day: 09/12 0701 - 09/13 0700 In: 317 [I.V.:317] Out: 2702 [Urine:2300; Stool:402] Intake/Output this shift: No intake/output data recorded.  Lungs: cta  Cardiovascular: reg, +click  Abd: soft; obese, bruising; incisions ok  Extremities: chronic RLE lymphedema, no LLE edema  Neuro: alert, nonfocal  Lab Results: CBC  Recent Labs    02/09/18 0505 02/10/18 0537  WBC 6.7 5.8  HGB 10.1* 10.2*  HCT 32.7* 33.9*  PLT 280 288   BMET Recent Labs    02/09/18 0505 02/10/18 0537  NA 141 146*  K 3.5 3.5  CL 98 102  CO2 30 32  GLUCOSE 90 94  BUN 42* 41*  CREATININE 1.14* 1.22*  CALCIUM 9.2 9.6   PT/INR Recent Labs    02/09/18 0505 02/10/18 0537  LABPROT 17.8* 18.6*  INR 1.48 1.57   ABG No results for input(s): PHART, HCO3 in the last 72 hours.  Invalid input(s): PCO2, PO2  Studies/Results:  Anti-infectives: Anti-infectives (From admission, onward)   Start     Dose/Rate Route Frequency Ordered Stop   02/02/18 0800  piperacillin-tazobactam (ZOSYN) IVPB 3.375 g  Status:  Discontinued     3.375 g 12.5 mL/hr over 240 Minutes Intravenous Every 8 hours 02/02/18 0300 02/06/18 1903   02/01/18 2245  piperacillin-tazobactam (ZOSYN) IVPB 3.375 g     3.375 g 100 mL/hr over 30 Minutes Intravenous NOW 02/01/18 2234 02/02/18 0203      Medications: Scheduled Meds: .  sodium chloride   Intravenous Once  . acetaminophen  650 mg Oral Q6H  . arformoterol  15 mcg Nebulization BID  . budesonide (PULMICORT) nebulizer solution  0.5 mg Nebulization BID  . buPROPion  300 mg Oral Daily  . citalopram  20 mg Oral Daily  . fluticasone  2 spray Each Nare Daily  . furosemide  40 mg Intravenous BID  . insulin aspart  0-9 Units Subcutaneous Q4H  . mouth rinse  15 mL Mouth Rinse BID  . metoprolol tartrate  25 mg Oral BID  . pantoprazole (PROTONIX) IV  40 mg Intravenous Daily  . Warfarin - Pharmacist Dosing Inpatient   Does not apply q1800   Continuous Infusions: . heparin    . ondansetron (ZOFRAN) IV     PRN Meds:.ipratropium-albuterol, ondansetron (ZOFRAN) IV **OR** ondansetron (ZOFRAN) IV  Assessment/Plan: Patient Active Problem List   Diagnosis Date Noted  . Contraindication to anticoagulation therapy   . Acute diastolic CHF (congestive heart failure), NYHA class 4 ([place])   . Acute on chronic respiratory failure with hypoxia and hypercapnia ([place]) 02/03/2018  . Acute metabolic encephalopathy 49/70/2637  . Fever 02/03/2018  . Leukocytosis 02/03/2018  . Abdominal pain 02/02/2018  . Hyperkalemia 02/02/2018  . Acute kidney injury superimposed on CKD ([place]) 02/02/2018  . Mesenteric hematoma 02/02/2018  . Hemoperitoneum 02/02/2018  . Anemia   . 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 02/01/2018  .  Postoperative anemia due to chronic blood loss on full anticoagulation 02/01/2018  . AKI (acute kidney injury) ([place]) 02/01/2018  . Acute renal failure superimposed on stage 3 chronic kidney disease ([place]) 01/26/2018  . PAF (paroxysmal atrial fibrillation) ([place])   . Acute respiratory failure ([place])   . Hypoxia   . Postprocedural hypotension   . Chronic acquired lymphedema - Right lower extremity 01/24/2018  . Hx of mechanical aortic valve replacement 01/24/2018  . Obstructive sleep apnea 02/03/2017  . Chronic diastolic heart failure ([place]) 01/11/2017  . Morbid obesity  with BMI of 50.0-59.9, adult ([place]) 02/11/2016  . HX: long term anticoagulant use 02/11/2016  . Chronic respiratory failure ([place]) 08/12/2015  . 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 Allergic rhinitis 05/02/2015  . Symptomatic anemia 04/08/2015  . History of mitral valve replacement with mechanical valve 04/08/2015  . CKD (chronic kidney disease) stage 3, GFR 30-59 ml/min (HCC) 04/08/2015  . Atrial flutter, unspecified   . Chronic anticoagulation 09/04/2014  . Warfarin anticoagulation 08/14/2014  . ILD (interstitial lung disease) ([place]) 10/12/2013  . Aortic stenosis 10/02/2013  . Hyperlipidemia 10/02/2013  . Chronic asthmatic bronchitis ([place]) 08/06/2013  . Gout 04/16/2011  . Essential hypertension 10/07/2010  . Depressive disorder, not elsewhere classified 10/07/2010   S/p lap sleeve gastrectomy with hiatal hernia repair 01/24/18 Readmitted with AKI on CKD, ABL anemia DIAGNOSTIC LAPAROSCOPY EVACUATION OF HEMATOMA 02/03/2018  Doing well.  -GI -contmodified soft diet (high protein, no carbs)  -FEN - see above, see above, HLIV, lytes ok  -Renal - aki on ckd - resolved, good diuresis, Cr nml, renal signed off;bid lasix now; daily bmet  - CV - BPok. Cont lopressor  -Mechanical valves - defer repeat echo to cards; started hep gttsunday, no signs of bleeding, startedcoumadin 9/9per pharmacy  -ID - no fever, wbcnormal.All cultures negative to date, ID felt wbc/fever could have been from hemoperitoneum.D/c zosyn 9/9  -Endo - blood sugars ok; stop SSI and finger sticks  -pt eval, OOB to chair today  -pulm - cont home nebs  Disposition:awaiting therapeutic INR Ambulate in hall   LOS: 8 days    Greer Pickerel, MD [phone] [facility]
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
## 2018-02-11 LAB — BASIC METABOLIC PANEL
ANION GAP: 11 (ref 5–15)
BUN: 37 mg/dL — ABNORMAL HIGH (ref 6–20)
CALCIUM: 9.3 mg/dL (ref 8.9–10.3)
CHLORIDE: 99 mmol/L (ref 98–111)
CO2: 30 mmol/L (ref 22–32)
Creatinine, Ser: 1.09 mg/dL — ABNORMAL HIGH (ref 0.44–1.00)
GFR calc non Af Amer: 55 mL/min — ABNORMAL LOW (ref >60)
Glucose, Bld: 89 mg/dL (ref 70–99)
POTASSIUM: 3.4 mmol/L — AB (ref 3.5–5.1)
Sodium: 140 mmol/L (ref 135–145)

## 2018-02-11 LAB — CBC
HEMATOCRIT: 33.6 % — AB (ref 36.0–46.0)
HEMOGLOBIN: 10.5 g/dL — AB (ref 12.0–15.0)
MCH: 30 pg (ref 26.0–34.0)
MCHC: 31.3 g/dL (ref 30.0–36.0)
MCV: 96 fL (ref 78.0–100.0)
Platelets: 304 10*3/uL (ref 150–400)
RBC: 3.5 MIL/uL — ABNORMAL LOW (ref 3.87–5.11)
RDW: 16.1 % — ABNORMAL HIGH (ref 11.5–15.5)
WBC: 5.5 10*3/uL (ref 4.0–10.5)

## 2018-02-11 LAB — PROTIME-INR
INR: 1.72
Prothrombin Time: 20 seconds — ABNORMAL HIGH (ref 11.4–15.2)

## 2018-02-11 LAB — GLUCOSE, CAPILLARY: Glucose-Capillary: 91 mg/dL (ref 70–99)

## 2018-02-11 LAB — HEPARIN LEVEL (UNFRACTIONATED): Heparin Unfractionated: 0.33 IU/mL (ref 0.30–0.70)

## 2018-02-11 MED ORDER — WARFARIN SODIUM 5 MG PO TABS
7.5000 mg | ORAL_TABLET | Freq: Once | ORAL | Status: AC
  Administered 2018-02-11: 7.5 mg via ORAL
  Filled 2018-02-11: qty 1

## 2018-02-11 NOTE — Progress Notes (Signed)
ANTICOAGULATION CONSULT NOTE -  Follow up  Pharmacy Consult for IV heparin, warfarin Indication: mechanical AVR and MVR  No Known Allergies  Patient Measurements: Height: 5\' 5"  (165.1 cm) Weight: (!) 301 lb 3.2 oz (136.6 kg) IBW/kg (Calculated) : 57 Heparin Dosing Weight: 96 kg  Vital Signs: Temp: 98.1 F (36.7 C) (09/14 1059) Temp Source: Oral (09/14 1059) BP: 126/68 (09/14 1059) Pulse Rate: 82 (09/14 1059)  Labs: Recent Labs    02/09/18 0505  02/10/18 0537 02/10/18 1605 02/11/18 0507  HGB 10.1*  --  10.2*  --  10.5*  HCT 32.7*  --  33.9*  --  33.6*  PLT 280  --  288  --  304  LABPROT 17.8*  --  18.6*  --  20.0*  INR 1.48  --  1.57  --  1.72  HEPARINUNFRC 0.29*   < > 0.28* 0.32 0.33  CREATININE 1.14*  --  1.22*  --  1.09*   < > = values in this interval not displayed.    Estimated Creatinine Clearance: 78.9 mL/min (A) (by C-G formula based on SCr of 1.09 mg/dL (H)).   Assessment: 30 y/oF with PMH of mechanical AVR and MVR who underwent laparoscopic gastric sleeve resection on 01/24/18. Post-op, she was resumed on warfarin with heparin bridge, then subsequently discharged on warfarin with Enoxaparin bridge on 9/3, as INR was not yet therapeutic. Patient re-admitted 9/4 with abdominal pain and anemia, found to have extensive hemoperitoneum and mesenteric hematomas. Anticoagulation held (last doses on 9/4), patient was transfused, and underwent evacuation of hematoma on 9/6. Pharmacy consulted to resume IV heparin (no bolus).  Home warfarin regimen prior to admission: 7.5 mg daily except 3.75 mg on Wed, Fri  Warfarin resumed inpatient on 9/9  Today, 02/11/2018:  Heparin level now therapeutic on 1600 units/hr  INR remains subtherapeutic but is trending up appropriately  Hgb/Plt stable  no bleeding documented  no significant drug-drug interactions with warfarin noted  Goal of Therapy:  Heparin level 0.3-0.5 units/ml (lower goal per Dr. Vaughan Browner) INR  2.5-3.5 Monitor platelets by anticoagulation protocol: Yes   Plan:   Continue heparin infusion at 1600 units/hr  CBC, HL, INR daily  Repeat warfarin 7.5 mg PO this evening  Monitor closely for s/sx of bleeding  Reuel Boom, PharmD, BCPS [phone] 02/11/2018, 11:11 AM

## 2018-02-11 NOTE — Progress Notes (Signed)
[facility] [place] [facility] Office:  [phone] General Surgery Progress Note   LOS: 9 days  POD -  8 Days Post-Op  Chief Complaint: Abdominal pain/post op  Assessment and Plan: 1.  DIAGNOSTIC LAPAROSCOPY EVACUATION OF HEMATOMA - 02/03/2018 - [name]  Taking po's - much less abdominal pain  Awaiting INR to get to therapeutic  2.  Sleeve Gastrectomy - 01/24/2018 - [place] 3.  HTN 4.  Mechanical valves 5.  Anticoagulated  PT/INR - 20/1.72 - 02/11/2018  Target is INR > 2.0   6.  DVT prophylaxis - anticogulated for valves  Principal Problem:   Fever Active Problems:   Chronic asthmatic bronchitis (HCC)   ILD (interstitial lung disease) ([place])   History of mitral valve replacement with mechanical valve   Chronic respiratory failure (HCC)   Chronic diastolic heart failure (HCC)   Obstructive sleep apnea   Morbid obesity with BMI of 50.0-59.9, adult (HCC)   Chronic acquired lymphedema - Right lower extremity   Hx of mechanical aortic valve replacement   Acute renal failure superimposed on stage 3 chronic kidney disease (HCC)   Gout   HX: long term anticoagulant use   Warfarin anticoagulation   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  No complaint.  Objective:   Vitals:   02/11/18 0151 02/11/18 0553  BP: (!) 127/53 114/61  Pulse: 62 65  Resp: 18 14  Temp: 98 F (36.7 C) 98.2 F (36.8 C)  SpO2: 98% 100%     Intake/Output from previous day:  09/13 0701 - 09/14 0700 In: 1597.5 [P.O.:1316; I.V.:281.5] Out: 1850 [Urine:1850]  Intake/Output this shift:  No intake/output data  recorded.   Physical Exam:   General: Obese WF who is alert and oriented.    HEENT: Normal. Pupils equal. .   Lungs: Clear   Abdomen: Much softer than when I last saw her.  Ecchymosis of entire abdominal wall.   Wound: Clean   Lab Results:    Recent Labs    02/10/18 0537 02/11/18 0507  WBC 5.8 5.5  HGB 10.2* 10.5*  HCT 33.9* 33.6*  PLT 288 304    BMET   Recent Labs    02/10/18 0537 02/11/18 0507  NA 146* 140  K 3.5 3.4*  CL 102 99  CO2 32 30  GLUCOSE 94 89  BUN 41* 37*  CREATININE 1.22* 1.09*  CALCIUM 9.6 9.3    PT/INR   Recent Labs    02/10/18 0537 02/11/18 0507  LABPROT 18.6* 20.0*  INR 1.57 1.72    ABG  No results for input(s): PHART, HCO3 in the last 72 hours.  Invalid input(s): PCO2, PO2   Studies/Results:  No results found.   Anti-infectives:   Anti-infectives (From admission, onward)   Start     Dose/Rate Route Frequency Ordered Stop   02/02/18 0800  piperacillin-tazobactam (ZOSYN) IVPB 3.375 g  Status:  Discontinued     3.375 g 12.5 mL/hr over 240 Minutes Intravenous Every 8 hours 02/02/18 0300 02/06/18 1903   02/01/18 2245  piperacillin-tazobactam (ZOSYN) IVPB 3.375 g     3.375 g 100 mL/hr over 30  Minutes Intravenous NOW 02/01/18 2234 02/02/18 0203      Alphonsa Overall, MD, FACS Pager: [phone] Surgery Office: [phone] 02/11/2018

## 2018-02-12 LAB — CBC
HCT: 32.9 % — ABNORMAL LOW (ref 36.0–46.0)
HEMOGLOBIN: 10.2 g/dL — AB (ref 12.0–15.0)
MCH: 29.8 pg (ref 26.0–34.0)
MCHC: 31 g/dL (ref 30.0–36.0)
MCV: 96.2 fL (ref 78.0–100.0)
Platelets: 309 10*3/uL (ref 150–400)
RBC: 3.42 MIL/uL — AB (ref 3.87–5.11)
RDW: 16.1 % — ABNORMAL HIGH (ref 11.5–15.5)
WBC: 4.7 10*3/uL (ref 4.0–10.5)

## 2018-02-12 LAB — BASIC METABOLIC PANEL
Anion gap: 11 (ref 5–15)
BUN: 39 mg/dL — ABNORMAL HIGH (ref 6–20)
CALCIUM: 9.5 mg/dL (ref 8.9–10.3)
CHLORIDE: 100 mmol/L (ref 98–111)
CO2: 31 mmol/L (ref 22–32)
Creatinine, Ser: 1.14 mg/dL — ABNORMAL HIGH (ref 0.44–1.00)
GFR, EST NON AFRICAN AMERICAN: 52 mL/min — AB (ref >60)
Glucose, Bld: 96 mg/dL (ref 70–99)
Potassium: 3.1 mmol/L — ABNORMAL LOW (ref 3.5–5.1)
SODIUM: 142 mmol/L (ref 135–145)

## 2018-02-12 LAB — GLUCOSE, CAPILLARY: Glucose-Capillary: 88 mg/dL (ref 70–99)

## 2018-02-12 LAB — PROTIME-INR
INR: 2.24
PROTHROMBIN TIME: 24.6 s — AB (ref 11.4–15.2)

## 2018-02-12 LAB — HEPARIN LEVEL (UNFRACTIONATED): HEPARIN UNFRACTIONATED: 0.31 [IU]/mL (ref 0.30–0.70)

## 2018-02-12 NOTE — Care Management Note (Signed)
Case Management Note  Patient Details  Name: [name] MRN: [id number] Date of Birth: 05-16-1961  Subjective/Objective:   Laparoscopy evacuation of hematoma 9/6, sleeve gastrectomy 8/27                 Action/Plan: Spoke to pt and oxygen ([employer]), and neb machine. [facility] delivered RW to room. Contacted [facility] to make aware of dc home today with HHPT.   Expected Discharge Date:  02/12/18               Expected Discharge Plan:  [place]  In-House Referral:  NA  Discharge planning Services  CM Consult  Post Acute Care Choice:  (S) Durable Medical Equipment, Home Health(home oxygen-[employer]) Choice offered to:  Patient  DME Arranged:  [place] rolling DME Agency:  [place] Arranged:  PT, Nurse's Aide [place] Agency:  [place]  Status of Service:  Completed, signed off  If discussed at [place] of Stay Meetings, dates discussed:    Additional Comments:  Erenest Rasher, RN 02/12/2018, 9:45 AM

## 2018-02-12 NOTE — Discharge Instructions (Signed)
[facility] - DISCHARGE INSTRUCTIONS TO PATIENT  Activity:  Driving - May drive in 7 days, if doing well, and off all pain meds   Lifting - No lifting more than 15 pounds for 7 days, then no limit  Wound Care:   May Shower  Diet:  Sleeve gastrectomy diet  Follow up appointment:  Call Dr. Victorino Dike office [facility]) at [phone] for an appointment in 1 to 2 weeks.  Medications and dosages:  Resume your home medications.  Call Dr. [name] or his office  [phone]) if you have:  Temperature greater than 100.4,  Persistent nausea and vomiting,  Severe uncontrolled pain,  Redness, tenderness, or signs of infection (pain, swelling, redness, odor or green/yellow discharge around the site),  Difficulty breathing, headache or visual disturbances,  Any other questions or concerns you may have after discharge.  In an emergency, call 911 or go to an Emergency Department at a nearby hospital.

## 2018-02-12 NOTE — Progress Notes (Signed)
Pt was discharged home today. Instructions were reviewed with patient, and questions were answered. Pt was taken to main entrance via wheelchair by NT.  

## 2018-02-12 NOTE — Progress Notes (Signed)
[facility]:  [phone] General Surgery Progress Note   LOS: 10 days  POD -  9 Days Post-Op  Chief Complaint: Abdominal pain/post op  Assessment and Plan: 1.  DIAGNOSTIC LAPAROSCOPY EVACUATION OF HEMATOMA - 02/03/2018 - [name]  Taking po's - much less abdominal pain  2.  Sleeve Gastrectomy - 01/24/2018 - [facility] 3.  HTN 4.  Mechanical valves 5.  Anticoagulated  PT/INR - 24/2.24 - 02/12/2018  Target is INR > 2.0 6.  DVT prophylaxis - anticogulated for heart valves   Principal Problem:   Fever Active Problems:   Chronic asthmatic bronchitis (HCC)   ILD (interstitial lung disease) (HCC)   History of mitral valve replacement with mechanical valve   Chronic respiratory failure (HCC)   Chronic diastolic heart failure (HCC)   Obstructive sleep apnea   Morbid obesity with BMI of 50.0-59.9, adult (HCC)   Chronic acquired lymphedema - Right lower extremity   Hx of mechanical aortic valve replacement   Acute renal failure superimposed on stage 3 chronic kidney disease (HCC)   Gout   HX: long term anticoagulant use   Warfarin anticoagulation   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  No complaint.  Objective:   Vitals:   02/12/18 0202 02/12/18 0654  BP: (!) 104/55 (!) 105/50  Pulse: 64 70  Resp: 18 18  Temp: 98.3 F (36.8 C) 97.7 F (36.5 C)  SpO2: 99% 96%     Intake/Output from previous day:  09/14 0701 - 09/15 0700 In: 983 [P.O.:600; I.V.:383] Out: 1500 [Urine:1100; Stool:400]  Intake/Output this shift:  No intake/output data  recorded.   Physical Exam:   General: Obese WF who is alert and oriented.    HEENT: Normal. Pupils equal. .   Lungs: Clear   Abdomen: Much softer than when I last saw her.  Ecchymosis of entire abdominal wall.   Wound: Clean   Lab Results:    Recent Labs    02/11/18 0507 02/12/18 0521  WBC 5.5 4.7  HGB 10.5* 10.2*  HCT 33.6* 32.9*  PLT 304 309    BMET   Recent Labs    02/11/18 0507 02/12/18 0521  NA 140 142  K 3.4* 3.1*  CL 99 100  CO2 30 31  GLUCOSE 89 96  BUN 37* 39*  CREATININE 1.09* 1.14*  CALCIUM 9.3 9.5    PT/INR   Recent Labs    02/11/18 0507 02/12/18 0521  LABPROT 20.0* 24.6*  INR 1.72 2.24    ABG  No results for input(s): PHART, HCO3 in the last 72 hours.  Invalid input(s): PCO2, PO2   Studies/Results:  No results found.   Anti-infectives:   Anti-infectives (From admission, onward)   Start     Dose/Rate Route Frequency Ordered Stop   02/02/18 0800  piperacillin-tazobactam (ZOSYN) IVPB 3.375 g  Status:  Discontinued     3.375 g 12.5 mL/hr over 240 Minutes Intravenous Every 8 hours 02/02/18 0300 02/06/18 1903   02/01/18 2245  piperacillin-tazobactam (ZOSYN) IVPB 3.375 g     3.375 g 100 mL/hr over 30 Minutes Intravenous NOW 02/01/18 2234  02/02/18 0203      Alphonsa Overall, MD, FACS Pager: [facility]: [phone] 02/12/2018

## 2018-02-14 DIAGNOSIS — I890 Lymphedema, not elsewhere classified: Secondary | ICD-10-CM | POA: Diagnosis not present

## 2018-02-14 DIAGNOSIS — J449 Chronic obstructive pulmonary disease, unspecified: Secondary | ICD-10-CM | POA: Diagnosis not present

## 2018-02-14 DIAGNOSIS — Z9884 Bariatric surgery status: Secondary | ICD-10-CM | POA: Diagnosis not present

## 2018-02-14 DIAGNOSIS — Z87891 Personal history of nicotine dependence: Secondary | ICD-10-CM | POA: Diagnosis not present

## 2018-02-14 DIAGNOSIS — D62 Acute posthemorrhagic anemia: Secondary | ICD-10-CM | POA: Diagnosis not present

## 2018-02-14 DIAGNOSIS — E785 Hyperlipidemia, unspecified: Secondary | ICD-10-CM | POA: Diagnosis not present

## 2018-02-14 DIAGNOSIS — Z952 Presence of prosthetic heart valve: Secondary | ICD-10-CM | POA: Diagnosis not present

## 2018-02-14 DIAGNOSIS — N183 Chronic kidney disease, stage 3 (moderate): Secondary | ICD-10-CM | POA: Diagnosis not present

## 2018-02-14 DIAGNOSIS — I4891 Unspecified atrial fibrillation: Secondary | ICD-10-CM | POA: Diagnosis not present

## 2018-02-14 DIAGNOSIS — I130 Hypertensive heart and chronic kidney disease with heart failure and stage 1 through stage 4 chronic kidney disease, or unspecified chronic kidney disease: Secondary | ICD-10-CM | POA: Diagnosis not present

## 2018-02-14 DIAGNOSIS — I5031 Acute diastolic (congestive) heart failure: Secondary | ICD-10-CM | POA: Diagnosis not present

## 2018-02-14 DIAGNOSIS — Z9981 Dependence on supplemental oxygen: Secondary | ICD-10-CM | POA: Diagnosis not present

## 2018-02-14 DIAGNOSIS — Z48815 Encounter for surgical aftercare following surgery on the digestive system: Secondary | ICD-10-CM | POA: Diagnosis not present

## 2018-02-14 DIAGNOSIS — Z7901 Long term (current) use of anticoagulants: Secondary | ICD-10-CM | POA: Diagnosis not present

## 2018-02-14 DIAGNOSIS — J849 Interstitial pulmonary disease, unspecified: Secondary | ICD-10-CM | POA: Diagnosis not present

## 2018-02-15 ENCOUNTER — Telehealth (HOSPITAL_COMMUNITY): Payer: Self-pay

## 2018-02-15 ENCOUNTER — Ambulatory Visit (INDEPENDENT_AMBULATORY_CARE_PROVIDER_SITE_OTHER): Payer: Medicare Other | Admitting: *Deleted

## 2018-02-15 DIAGNOSIS — Z5181 Encounter for therapeutic drug level monitoring: Secondary | ICD-10-CM | POA: Diagnosis not present

## 2018-02-15 DIAGNOSIS — I350 Nonrheumatic aortic (valve) stenosis: Secondary | ICD-10-CM | POA: Diagnosis not present

## 2018-02-15 LAB — POCT INR: INR: 5.1 — AB (ref 2.0–3.0)

## 2018-02-15 NOTE — Telephone Encounter (Signed)
Talked with patient earlier regarding dietitian visits and some diarrhea.  Patient/sister indicated diarrhea that is attributed to dairy products. Discussed the above with covering surgeon who agreed patient could try Imodium and stop dairy products.  Encouraged patient to try plant based protein if supplementation needed.  Indicated she tolerated chicken, [place], eggs.

## 2018-02-15 NOTE — Patient Instructions (Signed)
Description   Do not take any Coumadin today and No Coumadin tomorrow then start taking Coumadin 1/2 tablet daily except 1 tablet on Sundays, Tuesdays, Thursdays. [facility] # [phone].

## 2018-02-16 ENCOUNTER — Encounter: Payer: Self-pay | Admitting: Nurse Practitioner

## 2018-02-16 DIAGNOSIS — D62 Acute posthemorrhagic anemia: Secondary | ICD-10-CM | POA: Diagnosis not present

## 2018-02-16 DIAGNOSIS — I5031 Acute diastolic (congestive) heart failure: Secondary | ICD-10-CM | POA: Diagnosis not present

## 2018-02-16 DIAGNOSIS — J849 Interstitial pulmonary disease, unspecified: Secondary | ICD-10-CM | POA: Diagnosis not present

## 2018-02-16 DIAGNOSIS — N183 Chronic kidney disease, stage 3 (moderate): Secondary | ICD-10-CM | POA: Diagnosis not present

## 2018-02-16 DIAGNOSIS — I130 Hypertensive heart and chronic kidney disease with heart failure and stage 1 through stage 4 chronic kidney disease, or unspecified chronic kidney disease: Secondary | ICD-10-CM | POA: Diagnosis not present

## 2018-02-16 DIAGNOSIS — Z48815 Encounter for surgical aftercare following surgery on the digestive system: Secondary | ICD-10-CM | POA: Diagnosis not present

## 2018-02-20 NOTE — Discharge Summary (Signed)
Physician Discharge Summary  [name]:[id number] DOB: Aug 08, 1960 DOA: 02/01/2018  PCP: [name], FNP  Admit date: 02/01/2018 Discharge date: 02/12/2018  Recommendations for Outpatient Follow-up:    Follow-up Information    [facility]   Specialty:  Home Health Services Why:  [facility] physical therapy Contact information: [address] [place] [address] [phone]          F/u Dr Randall Hiss Ronnie Derby  Within 2 weeks  F/u coumadin clinic 9/19 - appt already made  Discharge Diagnoses:    Chronic asthmatic bronchitis ([employer])   ILD (interstitial lung disease) ([place])   History of mitral valve replacement with mechanical valve   Chronic diastolic heart failure ([place])   Obstructive sleep apnea   Morbid obesity with BMI of 50.0-59.9, adult ([place])   Chronic acquired lymphedema - Right lower extremity   Hx of mechanical aortic valve replacement   Acute renal failure superimposed on stage 3 chronic kidney disease (HCC)   Gout   HX: long term anticoagulant use   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   Postoperative anemia due to chronic blood loss on full anticoagulation   Hyperkalemia   Hemoperitoneum   Anemia   Leukocytosis  Surgical Procedure: Diagnostic laparoscopy, evacuation of intra-abdominal hematoma 02/03/2018  Discharge Condition: Good Disposition: Home with home health physical therapy  Diet recommendation: Bariatric soft mechanical  Filed Weights   02/10/18 0439 02/11/18 0553 02/12/18 0434  Weight: 135.9 kg (!) 136.6 kg (!) 136.2 kg    History of present illness:  57 year old severely obese female with history of 2 mechanical heart valves he will underwent recent sleeve gastrectomy on January 25, 8415 complicated by postoperative bleeding, acute on chronic kidney disease which resolved who was just discharged on September 3 on a Lovenox Coumadin bridge re-presented with failure to thrive, worsening abdominal pain and  loose stools.  She was found to have recurrent elevated creatinine of 2.5, hyperkalemia and leukocytosis.  CT scan was performed which demonstrated large amounts of hemoperitoneum as well as several fluid collections which were consistent with hematoma.  She was admitted to stepdown unit and started on IV antibiotics.    Hospital Course:  She was admitted to the stepdown unit started on IV Zosyn.  She was also transfused PRBC.  She required 4 units of PRBCs before her hemoglobin responded.  She did not have hypotension but did have some tachycardia.  Nephrology, cardiology, and infectious disease were consulted.  She was initially admitted to the hospitalist service.  On Friday, September 6 she had spiked a temperature overnight and her white count worsened to 37,000.  I decided to take her back to the operating room for diagnostic laparoscopy.  During surgery there is no evidence of enteric contents or leak from her sleeve gastrectomy.  There is just a bunch of old blood.  I washed out her abdomen.  Postoperatively she remained intubated in the ICU and was ultimately extubated she was started on clear liquids.  Her white count started normalizing.  Her acute kidney injury started resolving.  Physical therapy and Occupational Therapy were consulted.  Her blood cultures and intraoperative cultures from the hematoma were negative and IV Zosyn was discontinued.  After her hemoglobin have remained stable for 36 hours she was started on a heparin drip on Sunday without a bolus.  Her hemoglobin remained stable and she was ultimately started back on oral Coumadin.  She was transferred to telemetry.  Her diet was rapidly  advanced to a bariatric soft mechanical diet.  Because she had failed attempts at outpatient bridging with Lovenox and Coumadin she was kept in the hospital until her INR was above 2 on a heparin drip and daily Coumadin.  On day of discharge her INR was greater than 2 without any signs of bleeding.  She  had been aggressively diuresed by cardiology while she was here.  Her vital signs are stable.  She was tolerating a diet.  She was deemed stable for discharge.  She had received extensive discharge teaching by pharmacy and the bariatric nurse.   Discharge Instructions  Discharge Instructions    Ambulate hourly while awake   Complete by:  As directed    Call MD for:  difficulty breathing, headache or visual disturbances   Complete by:  As directed    Call MD for:  persistant dizziness or light-headedness   Complete by:  As directed    Call MD for:  persistant nausea and vomiting   Complete by:  As directed    Call MD for:  redness, tenderness, or signs of infection (pain, swelling, redness, odor or green/yellow discharge around incision site)   Complete by:  As directed    Call MD for:  severe uncontrolled pain   Complete by:  As directed    Call MD for:  temperature >101 F   Complete by:  As directed    Diet bariatric full liquid   Complete by:  As directed    Incentive spirometry   Complete by:  As directed    Perform hourly while awake     Allergies as of 02/12/2018   No Known Allergies     Medication List    STOP taking these medications   enoxaparin 150 MG/ML injection Commonly known as:  LOVENOX     TAKE these medications   acetaminophen 650 MG CR tablet Commonly known as:  TYLENOL Take 1,300 mg by mouth 3 (three) times daily.   albuterol 108 (90 Base) MCG/ACT inhaler Commonly known as:  PROVENTIL HFA;VENTOLIN HFA Inhale 2 puffs into the lungs 2 (two) times daily as needed for wheezing or shortness of breath. Reported on 06/20/2015   albuterol (2.5 MG/3ML) 0.083% nebulizer solution Commonly known as:  PROVENTIL Take 3 mLs (2.5 mg total) by nebulization every 4 (four) hours as needed for wheezing or shortness of breath.   allopurinol 300 MG tablet Commonly known as:  ZYLOPRIM Take 300 mg by mouth at bedtime.   buPROPion 300 MG 24 hr tablet Commonly known as:   WELLBUTRIN XL Take 300 mg by mouth daily.   CENTRUM SILVER 50+WOMEN Tabs Take 1 tablet by mouth daily.   cetirizine 10 MG tablet Commonly known as:  ZYRTEC Take 1 tablet (10 mg total) by mouth daily. What changed:  when to take this   citalopram 20 MG tablet Commonly known as:  CELEXA Take 20 mg by mouth daily.   metoprolol tartrate 50 MG tablet Commonly known as:  LOPRESSOR Take 0.5 tablets (25 mg total) by mouth 2 (two) times daily.   montelukast 10 MG tablet Commonly known as:  SINGULAIR Take 10 mg by mouth daily.   ondansetron 4 MG disintegrating tablet Commonly known as:  ZOFRAN-ODT Take 1 tablet (4 mg total) by mouth every 6 (six) hours as needed for nausea or vomiting.   OXYGEN Inhale 2.5-3 L into the lungs continuous.   pantoprazole 40 MG tablet Commonly known as:  PROTONIX Take 1 tablet (40 mg  total) by mouth daily.   pravastatin 80 MG tablet Commonly known as:  PRAVACHOL Take 80 mg by mouth daily.   SYMBICORT 80-4.5 MCG/ACT inhaler Generic drug:  budesonide-formoterol TAKE 2 PUFFS BY MOUTH TWICE A DAY What changed:  See the new instructions.   traMADol 50 MG tablet Commonly known as:  ULTRAM Take 1 tablet (50 mg total) by mouth every 6 (six) hours as needed (pain). What changed:  how much to take   triamcinolone 55 MCG/ACT Aero nasal inhaler Commonly known as:  NASACORT Place 2 sprays into the nose daily. What changed:    when to take this  reasons to take this   Vitamin D3 2000 units Tabs Take 4,000 Units by mouth daily.   warfarin 7.5 MG tablet Commonly known as:  COUMADIN Take as directed. If you are unsure how to take this medication, talk to your nurse or doctor. Original instructions:  TAKE 1 TABLET BY MOUTH DAILY OR AS DIRECTED BY COUMADIN CLINIC What changed:  See the new instructions.      Follow-up Information    [facility]   Specialty:  [place] Why:  [facility] physical therapy Contact  information: [address] [place] [address] [phone]            The results of significant diagnostics from this hospitalization (including imaging, microbiology, ancillary and laboratory) are listed below for reference.    Significant Diagnostic Studies: Ct Abdomen Pelvis Wo Contrast  Result Date: 02/01/2018 CLINICAL DATA:  57 year old female status post gastric sleeve procedure last Tuesday presents with abdominal pain, distention and diarrhea. Bruising along the abdominal. Patient is pale and there is concern for retroperitoneal hemorrhage. EXAM: CT ABDOMEN AND PELVIS WITHOUT CONTRAST TECHNIQUE: Multidetector CT imaging of the abdomen and pelvis was performed following the standard protocol without IV contrast. COMPARISON:  None. FINDINGS: Assessment of solid organ injury/laceration is limited due to lack of IV contrast. Lower chest: Heart size is enlarged without pericardial effusion. Evidence of prior median sternotomy and mitral valvular repair of the included heart. Subsegmental atelectasis is seen at the lung bases and/or scarring. No effusion or pneumothorax. Hepatobiliary: The unenhanced liver is unremarkable. No biliary dilatation. Nondistended gallbladder free of stones. Hyperdense appearance of the bile within the gallbladder may be secondary to hemolysis. No subcapsular fluid about the liver. Pancreas: Normal Spleen: Normal.  No subcapsular fluid. Adrenals/Urinary Tract: Normal bilateral adrenal glands. Small rounded cortical hyperdensities may represent tiny proteinaceous or hemorrhagic cysts. No nephrolithiasis nor obstructive uropathy. The urinary bladder is unremarkable. Stomach/Bowel: Staple line across the stomach from gastric sleeve procedure. No adjacent pneumoperitoneum or evidence of staple line dehiscence. Normal small bowel rotation. No bowel obstruction. The appendix is not confidently identified. Vascular/Lymphatic: Mild aortic atherosclerosis without  aneurysm. No lymphadenopathy. Reproductive: Retroverted appearing uterus.  No adnexal mass. Other: Localized hematomas are identified in the left upper quadrant of the abdomen with hematocrit levels ranging in 2.7 cm and 2.9 cm in the subdiaphragmatic portion of the left upper quadrant with two larger hematomas measuring 8.3 x 7.5 x 8.3 cm on series 2/24 and similar-sized hematoma further caudad measuring 8.3 x 7.8 x 9.1 cm with demonstrable hematocrit level, series 2/45. Moderate to large volume of hemoperitoneum without active hemorrhage is identified outlining the liver and spleen and extending into the pelvis along the paracolic gutters. Diffuse soft tissue induration and edema is noted of the abdominal and pelvic wall compatible with history of bruising. No retroperitoneal hemorrhage  is identified. No free air is identified. No apparent abscess. Musculoskeletal: Thoracolumbar spondylosis extending to the L2 level with marked disc space narrowing and degenerative disc disease. Disc bulge L4-5. No acute nor aggressive osseous lesions. IMPRESSION: 1. Fluid-hematocrit levels are identified within contained omental or mesenteric hematomas in the left hemiabdomen the largest measuring approximately 8.3 x 7.8 x 9.1 cm. 2. Complex non simple fluid is also seen, more free-flowing within the abdomen overlying the liver and spleen and extending into the pelvis compatible with stigmata of hemoperitoneum. No active focus of hemorrhage is identified on this unenhanced study. 3. Soft tissue induration of the abdominopelvic wall likely representing edema or concordant with the patient's history of bruising. 4. No retroperitoneal hemorrhage. 5. Intact gastric sleeve staple line without dehiscence identified. No adjacent free air is noted. No abscess collections are seen. No bowel obstruction is noted. These results were called by telephone at the time of interpretation on 02/01/2018 at 10:15 pm to [place] , who verbally  acknowledged these results. Electronically Signed   By: Ashley Royalty M.D.   On: 02/01/2018 22:15   US Renal  Result Date: 02/03/2018 CLINICAL DATA:  Acute renal injury. EXAM: RENAL / URINARY TRACT ULTRASOUND COMPLETE COMPARISON:  CT 02/01/2018. FINDINGS: Right Kidney: Length: 10.6 cm. Echogenicity within normal limits. Perinephric stranding. No mass or hydronephrosis visualized. Left Kidney: Length: 11.6 cm. Echogenicity within normal limits. Perinephric stranding. No mass or hydronephrosis visualized. Bladder: Decompressed with Foley catheter. Increased echogenicity incidentally noted the liver consistent fatty infiltration and/or hepatocellular disease. Ascites incidentally noted. IMPRESSION: 1. No acute focal renal abnormality. No hydronephrosis. Mild perinephric straining consistent with edema noted. 2.  Mild ascites. 3. Echogenic liver consistent fatty infiltration and/or hepatocellular disease. Electronically Signed   By: Marcello Moores  Register   On: 02/03/2018 10:45   US Renal  Result Date: 01/26/2018 CLINICAL DATA:  Acute renal insufficiency.  Morbid obesity. EXAM: RENAL / URINARY TRACT ULTRASOUND COMPLETE COMPARISON:  Abdominal ultrasound 04/12/2017 FINDINGS: Right Kidney: Length: 10.2 cm. Echogenicity within normal limits. No mass or hydronephrosis visualized. Left Kidney: Length: 12.0 cm. Echogenicity within normal limits. No mass or hydronephrosis visualized. Bladder: Poorly visualized.  Urinary Foley catheter in place. Increased echogenicity of the liver. Small volume abdominal ascites. IMPRESSION: Study limited by body habitus. Normal appearance of the kidneys. Increased echogenicity of the liver. Small volume perihepatic abdominal ascites. Electronically Signed   By: Fidela Salisbury M.D.   On: 01/26/2018 19:28   Dg Chest Port 1 View  Result Date: 02/06/2018 CLINICAL DATA:  Acute respiratory failure. EXAM: PORTABLE CHEST 1 VIEW COMPARISON:  02/05/2018 FINDINGS: Post extubation. Postsurgical  changes and right IJ approach central venous catheter, stable. Mildly enlarged cardiac silhouette. There is no evidence of focal pneumothorax. Low lung volumes with bilateral lower lobe streaky airspace opacities. Osseous structures are without acute abnormality. Soft tissues are grossly normal. IMPRESSION: Low lung volumes with bilateral lower lobe streaky airspace opacities may represent atelectasis versus airspace consolidation. Small bilateral subpulmonic effusions cannot be excluded. Electronically Signed   By: Fidela Salisbury M.D.   On: 02/06/2018 07:43   Dg Chest Port 1 View  Result Date: 02/05/2018 CLINICAL DATA:  Acute respiratory failure. EXAM: PORTABLE CHEST 1 VIEW COMPARISON:  02/04/2018 FINDINGS: Endotracheal tube and right IJ approach central venous catheter in stable position. Moderate rotation. Stable postsurgical changes. No evidence of pneumothorax. Stable bilateral lower lobe predominant streaky airspace opacities. Probable bilateral pleural effusions. Osseous structures are without acute abnormality. Soft tissues are grossly normal.  IMPRESSION: Stable bilateral streaky lower lobe predominant airspace opacities and bilateral pleural effusions. Findings may represent pulmonary edema with superimposed atelectasis or airspace consolidation and the bilateral lower lobes. Electronically Signed   By: Fidela Salisbury M.D.   On: 02/05/2018 07:14   Dg Chest Port 1 View  Result Date: 02/04/2018 CLINICAL DATA:  [place] pharyngeal secretions EXAM: PORTABLE CHEST 1 VIEW COMPARISON:  February 03, 2018 FINDINGS: The ETT terminates 2 cm above the carina, in good position. No pneumothorax. The right central line is stable. Platelike opacity in the right base is likely atelectasis. Haziness over the left hemithorax with left basilar opacity is similar, likely an effusion with underlying atelectasis. Infiltrate would be difficult to exclude on this study. The cardiomediastinal silhouette is stable.  IMPRESSION: 1. Support apparatus as above. 2. Probable effusion with underlying atelectasis on the left. Atelectasis in the right base. Electronically Signed   By: Dorise Bullion III M.D   On: 02/04/2018 07:04   Dg Chest Port 1 View  Result Date: 02/03/2018 CLINICAL DATA:  Central line placement. EXAM: PORTABLE CHEST 1 VIEW COMPARISON:  02/03/2018 FINDINGS: Right IJ approach central venous catheter terminates in the expected location of proximal superior vena cava. Endotracheal tube appropriately position. Stable postsurgical changes and valvular prosthesis noted. Cardiomediastinal silhouette is normal for portable technique. Mediastinal contours appear intact. There is no evidence of pneumothorax. Bilateral lower lobe streaky airspace opacities. Osseous structures are without acute abnormality. Soft tissues are grossly normal. IMPRESSION: Post intubation and central line placement. No evidence of pneumothorax. Persistent bilateral lower lobe predominant streaky airspace opacities. Electronically Signed   By: Fidela Salisbury M.D.   On: 02/03/2018 19:19   Dg Chest Port 1 View  Result Date: 02/03/2018 CLINICAL DATA:  Hypoxia.  Follow-up exam. EXAM: PORTABLE CHEST 1 VIEW COMPARISON:  02/02/2018 and older studies FINDINGS: Persistent lung base opacities consistent with atelectasis. Lung volumes remain low. No evidence of pulmonary edema. No new lung abnormalities. No pneumothorax. Mild cardiomegaly and stable changes from prior cardiac surgery and valve replacement. IMPRESSION: 1. No significant change from the prior study. 2. Persistent lung base opacities consistent with atelectasis. Lung volumes remain low. No evidence of pulmonary edema. Electronically Signed   By: Lajean Manes M.D.   On: 02/03/2018 09:47   Dg Chest Port 1 View  Result Date: 02/02/2018 CLINICAL DATA:  Acute hypoxia EXAM: PORTABLE CHEST 1 VIEW COMPARISON:  02/01/2018 and prior studies FINDINGS: Cardiomegaly and cardiac valve  replacement changes again noted in this low volume study. This is a low volume film with unchanged bibasilar atelectasis/opacities. No pneumothorax. There has been little interval change since the prior study. IMPRESSION: Unchanged appearance of the chest with bibasilar atelectasis/opacities in this low volume study Electronically Signed   By: Margarette Canada M.D.   On: 02/02/2018 11:56   Dg Chest Portable 1 View  Result Date: 02/01/2018 CLINICAL DATA:  Abdominal pain EXAM: PORTABLE CHEST 1 VIEW COMPARISON:  01/25/2018 FINDINGS: The heart is mildly enlarged. Stable vascular congestion. Lungs are under aerated with bibasilar atelectasis. Postoperative changes. No pneumothorax. IMPRESSION: Stable cardiomegaly and vascular congestion. Electronically Signed   By: Marybelle Killings M.D.   On: 02/01/2018 21:46   Dg Chest Port 1 View  Result Date: 01/25/2018 CLINICAL DATA:  Hypoxia.  Hernia repair. EXAM: PORTABLE CHEST 1 VIEW COMPARISON:  The one-view chest x-ray 01/24/2018 FINDINGS: Heart is enlarged. Lung volumes are low. Increased pulmonary vascular congestion is present. Bibasilar airspace disease likely reflects atelectasis. No other significant consolidation  is present. The visualized soft tissues and bony thorax are unremarkable. IMPRESSION: 1. Cardiopulmonary with increased pulmonary vascular congestion compatible with early congestive heart failure. 2. Low lung volumes with increased bibasilar airspace disease, likely atelectasis. Electronically Signed   By: San Morelle M.D.   On: 01/25/2018 10:00   Dg Chest Port 1 View  Result Date: 01/24/2018 CLINICAL DATA:  Shortness of breath, acute respiratory failure EXAM: PORTABLE CHEST 1 VIEW COMPARISON:  Chest x-ray of 01/12/2017 FINDINGS: Moderate cardiomegaly is stable. Median sternotomy sutures are again noted without complication. Aortic and mitral valve replacements are present. No acute infiltrate or effusion is seen. IMPRESSION: Stable cardiomegaly.  No  active lung disease. Electronically Signed   By: Ivar Drape M.D.   On: 01/24/2018 17:03    Microbiology: No results found for this or any previous visit (from the past 240 hour(s)).   Labs: CBC Latest Ref Rng & Units 02/12/2018 02/11/2018 02/10/2018  WBC 4.0 - 10.5 K/uL 4.7 5.5 5.8  Hemoglobin 12.0 - 15.0 g/dL 10.2(L) 10.5(L) 10.2(L)  Hematocrit 36.0 - 46.0 % 32.9(L) 33.6(L) 33.9(L)  Platelets 150 - 400 K/uL 309 304 288   BMP Latest Ref Rng & Units 02/12/2018 02/11/2018 02/10/2018  Glucose 70 - 99 mg/dL 96 89 94  BUN 6 - 20 mg/dL 39(H) 37(H) 41(H)  Creatinine 0.44 - 1.00 mg/dL 1.14(H) 1.09(H) 1.22(H)  BUN/Creat Ratio 9 - 23 - - -  Sodium 135 - 145 mmol/L 142 140 146(H)  Potassium 3.5 - 5.1 mmol/L 3.1(L) 3.4(L) 3.5  Chloride 98 - 111 mmol/L 100 99 102  CO2 22 - 32 mmol/L 31 30 32  Calcium 8.9 - 10.3 mg/dL 9.5 9.3 9.6   Hepatic Function Latest Ref Rng & Units 02/10/2018 02/09/2018 02/08/2018  Total Protein 6.5 - 8.1 g/dL - - -  Albumin 3.5 - 5.0 g/dL 3.7 3.2(L) 3.7  AST 15 - 41 U/L - - -  ALT 0 - 44 U/L - - -  Alk Phosphatase 38 - 126 U/L - - -  Total Bilirubin 0.3 - 1.2 mg/dL - - -  Bilirubin, Direct 0.00 - 0.40 mg/dL - - -    Principal Problem:   Fever Active Problems:   Chronic asthmatic bronchitis (HCC)   ILD (interstitial lung disease) (HCC)   History of mitral valve replacement with mechanical valve   Chronic respiratory failure (HCC)   Chronic diastolic heart failure (HCC)   Obstructive sleep apnea   Morbid obesity with BMI of 50.0-59.9, adult (HCC)   Chronic acquired lymphedema - Right lower extremity   Hx of mechanical aortic valve replacement   Acute renal failure superimposed on stage 3 chronic kidney disease (HCC)   Gout   HX: long term anticoagulant use   Warfarin anticoagulation   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

## 2018-02-21 ENCOUNTER — Ambulatory Visit (INDEPENDENT_AMBULATORY_CARE_PROVIDER_SITE_OTHER): Payer: Medicare Other | Admitting: Pharmacist

## 2018-02-21 DIAGNOSIS — Z5181 Encounter for therapeutic drug level monitoring: Secondary | ICD-10-CM

## 2018-02-21 DIAGNOSIS — N183 Chronic kidney disease, stage 3 (moderate): Secondary | ICD-10-CM | POA: Diagnosis not present

## 2018-02-21 DIAGNOSIS — I130 Hypertensive heart and chronic kidney disease with heart failure and stage 1 through stage 4 chronic kidney disease, or unspecified chronic kidney disease: Secondary | ICD-10-CM | POA: Diagnosis not present

## 2018-02-21 DIAGNOSIS — Z48815 Encounter for surgical aftercare following surgery on the digestive system: Secondary | ICD-10-CM | POA: Diagnosis not present

## 2018-02-21 DIAGNOSIS — D62 Acute posthemorrhagic anemia: Secondary | ICD-10-CM | POA: Diagnosis not present

## 2018-02-21 DIAGNOSIS — I5031 Acute diastolic (congestive) heart failure: Secondary | ICD-10-CM | POA: Diagnosis not present

## 2018-02-21 DIAGNOSIS — J849 Interstitial pulmonary disease, unspecified: Secondary | ICD-10-CM | POA: Diagnosis not present

## 2018-02-21 DIAGNOSIS — I350 Nonrheumatic aortic (valve) stenosis: Secondary | ICD-10-CM

## 2018-02-21 LAB — POCT INR: INR: 4.3 — AB (ref 2.0–3.0)

## 2018-02-21 NOTE — Patient Instructions (Signed)
Description   Do not take any Coumadin today, then start taking Coumadin 1/2 tablet daily except 1 tablet on Sundays and Thursdays. Recheck INR in 1 week. [facility] # [phone].

## 2018-02-22 ENCOUNTER — Telehealth: Payer: Self-pay | Admitting: Pulmonary Disease

## 2018-02-22 DIAGNOSIS — J849 Interstitial pulmonary disease, unspecified: Secondary | ICD-10-CM

## 2018-02-22 NOTE — Telephone Encounter (Signed)
Called and spoke to pt, who is requesting Rx for POC to be sent to [facility].  BQ please advise if okay to order.

## 2018-02-23 DIAGNOSIS — N183 Chronic kidney disease, stage 3 (moderate): Secondary | ICD-10-CM | POA: Diagnosis not present

## 2018-02-23 DIAGNOSIS — I5031 Acute diastolic (congestive) heart failure: Secondary | ICD-10-CM | POA: Diagnosis not present

## 2018-02-23 DIAGNOSIS — I130 Hypertensive heart and chronic kidney disease with heart failure and stage 1 through stage 4 chronic kidney disease, or unspecified chronic kidney disease: Secondary | ICD-10-CM | POA: Diagnosis not present

## 2018-02-23 DIAGNOSIS — D62 Acute posthemorrhagic anemia: Secondary | ICD-10-CM | POA: Diagnosis not present

## 2018-02-23 DIAGNOSIS — J849 Interstitial pulmonary disease, unspecified: Secondary | ICD-10-CM | POA: Diagnosis not present

## 2018-02-23 DIAGNOSIS — Z48815 Encounter for surgical aftercare following surgery on the digestive system: Secondary | ICD-10-CM | POA: Diagnosis not present

## 2018-02-23 NOTE — Telephone Encounter (Signed)
Order has been placed for POC. Settings ordered based off of 10/12/17 OV. Pt is aware and voiced her understanding. Nothing further is needed.

## 2018-02-23 NOTE — Telephone Encounter (Signed)
OK by me 

## 2018-02-24 ENCOUNTER — Telehealth: Payer: Self-pay | Admitting: Pulmonary Disease

## 2018-02-24 DIAGNOSIS — J849 Interstitial pulmonary disease, unspecified: Secondary | ICD-10-CM

## 2018-02-24 NOTE — Telephone Encounter (Signed)
Spoke with pt. Made her aware of Melissa's message. Pt would like for [place] to send an order to [facility] to be set up for a POC. Order has been placed. Nothing further was needed.

## 2018-02-27 DIAGNOSIS — I5031 Acute diastolic (congestive) heart failure: Secondary | ICD-10-CM | POA: Diagnosis not present

## 2018-02-27 DIAGNOSIS — N183 Chronic kidney disease, stage 3 (moderate): Secondary | ICD-10-CM | POA: Diagnosis not present

## 2018-02-27 DIAGNOSIS — I130 Hypertensive heart and chronic kidney disease with heart failure and stage 1 through stage 4 chronic kidney disease, or unspecified chronic kidney disease: Secondary | ICD-10-CM | POA: Diagnosis not present

## 2018-02-27 DIAGNOSIS — J849 Interstitial pulmonary disease, unspecified: Secondary | ICD-10-CM | POA: Diagnosis not present

## 2018-02-27 DIAGNOSIS — Z48815 Encounter for surgical aftercare following surgery on the digestive system: Secondary | ICD-10-CM | POA: Diagnosis not present

## 2018-02-27 DIAGNOSIS — D62 Acute posthemorrhagic anemia: Secondary | ICD-10-CM | POA: Diagnosis not present

## 2018-02-28 ENCOUNTER — Ambulatory Visit: Payer: Self-pay | Admitting: Nurse Practitioner

## 2018-02-28 ENCOUNTER — Encounter: Payer: Medicare Other | Attending: General Surgery | Admitting: Skilled Nursing Facility1

## 2018-02-28 ENCOUNTER — Ambulatory Visit (INDEPENDENT_AMBULATORY_CARE_PROVIDER_SITE_OTHER): Payer: Medicare Other

## 2018-02-28 ENCOUNTER — Ambulatory Visit (INDEPENDENT_AMBULATORY_CARE_PROVIDER_SITE_OTHER): Payer: Medicare Other | Admitting: Nurse Practitioner

## 2018-02-28 ENCOUNTER — Encounter: Payer: Self-pay | Admitting: Nurse Practitioner

## 2018-02-28 VITALS — BP 102/68 | HR 71 | Ht 65.0 in | Wt 293.1 lb

## 2018-02-28 DIAGNOSIS — Z79899 Other long term (current) drug therapy: Secondary | ICD-10-CM

## 2018-02-28 DIAGNOSIS — N183 Chronic kidney disease, stage 3 unspecified: Secondary | ICD-10-CM

## 2018-02-28 DIAGNOSIS — I5032 Chronic diastolic (congestive) heart failure: Secondary | ICD-10-CM

## 2018-02-28 DIAGNOSIS — Z9889 Other specified postprocedural states: Secondary | ICD-10-CM

## 2018-02-28 DIAGNOSIS — Z952 Presence of prosthetic heart valve: Secondary | ICD-10-CM | POA: Diagnosis not present

## 2018-02-28 DIAGNOSIS — I350 Nonrheumatic aortic (valve) stenosis: Secondary | ICD-10-CM

## 2018-02-28 DIAGNOSIS — Z23 Encounter for immunization: Secondary | ICD-10-CM | POA: Diagnosis not present

## 2018-02-28 DIAGNOSIS — Z5181 Encounter for therapeutic drug level monitoring: Secondary | ICD-10-CM

## 2018-02-28 DIAGNOSIS — Z7901 Long term (current) use of anticoagulants: Secondary | ICD-10-CM

## 2018-02-28 DIAGNOSIS — Z9884 Bariatric surgery status: Secondary | ICD-10-CM | POA: Diagnosis not present

## 2018-02-28 DIAGNOSIS — I5031 Acute diastolic (congestive) heart failure: Secondary | ICD-10-CM

## 2018-02-28 DIAGNOSIS — Z713 Dietary counseling and surveillance: Secondary | ICD-10-CM | POA: Diagnosis not present

## 2018-02-28 LAB — POCT INR: INR: 5.4 — AB (ref 2.0–3.0)

## 2018-02-28 LAB — HEPATIC FUNCTION PANEL
ALT: 16 IU/L (ref 0–32)
AST: 27 IU/L (ref 0–40)
Albumin: 4.1 g/dL (ref 3.5–5.5)
Alkaline Phosphatase: 103 IU/L (ref 39–117)
Bilirubin Total: 0.8 mg/dL (ref 0.0–1.2)
Bilirubin, Direct: 0.27 mg/dL (ref 0.00–0.40)
Total Protein: 7.2 g/dL (ref 6.0–8.5)

## 2018-02-28 LAB — CBC
Hematocrit: 36.2 % (ref 34.0–46.6)
Hemoglobin: 11.7 g/dL (ref 11.1–15.9)
MCH: 29.5 pg (ref 26.6–33.0)
MCHC: 32.3 g/dL (ref 31.5–35.7)
MCV: 91 fL (ref 79–97)
Platelets: 293 10*3/uL (ref 150–450)
RBC: 3.97 x10E6/uL (ref 3.77–5.28)
RDW: 16.4 % — ABNORMAL HIGH (ref 12.3–15.4)
WBC: 6.4 10*3/uL (ref 3.4–10.8)

## 2018-02-28 LAB — BASIC METABOLIC PANEL
BUN/Creatinine Ratio: 14 (ref 9–23)
BUN: 23 mg/dL (ref 6–24)
CO2: 26 mmol/L (ref 20–29)
Calcium: 9.5 mg/dL (ref 8.7–10.2)
Chloride: 96 mmol/L (ref 96–106)
Creatinine, Ser: 1.62 mg/dL — ABNORMAL HIGH (ref 0.57–1.00)
GFR calc Af Amer: 40 mL/min/{1.73_m2} — ABNORMAL LOW (ref >59)
GFR calc non Af Amer: 35 mL/min/{1.73_m2} — ABNORMAL LOW (ref >59)
Glucose: 108 mg/dL — ABNORMAL HIGH (ref 65–99)
Potassium: 3.6 mmol/L (ref 3.5–5.2)
Sodium: 141 mmol/L (ref 134–144)
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
## 2018-02-28 NOTE — Patient Instructions (Addendum)
We will be checking the following labs today - BMET, HPF and CBC  INR today   Medication Instructions:    Continue with your current medicines. BUT  I am cutting the lasix back to just once a day  Flu shot today    Testing/Procedures To Be Arranged:  N/A  Follow-Up:   See me in about a month.    Other Special Instructions:   N/A    If you need a refill on your cardiac medications before your next appointment, please call your pharmacy.   Call the [place] office at [phone] if you have any questions, problems or concerns.

## 2018-02-28 NOTE — Patient Instructions (Signed)
Do not take any Coumadin today or tomorrow, then [place] 1/2 tablet daily.   Recheck INR in 1 week. Cou[facility]in Clinic # [phone].  (Decreased dosage until you can start eating greens)

## 2018-02-28 NOTE — Patient Instructions (Addendum)
-  Aim to drink 40+ ounces of water per day.  -Consume protein foods every 3-5 hours daily (goal is 60 grams of protein per day.)  -Continue to chew foods thoroughly (to applesauce consistency) and eat meals/snacks slowly.  -Look into adding iron supplement (in addition to current MVI and calcium supplements) or replace current MVI with a multivitamin that already contains iron. Remember to take calcium and iron at least 2 hours apart.

## 2018-02-28 NOTE — Progress Notes (Signed)
2 Week Post-Operative Nutrition Class  Patient was seen on 02-28-2018 for Post-Operative Nutrition education at [facility].   Today's visit was the first with the RD since surgery 5 weeks ago on 01/24/18. Pt missed 2 week post-op class d/t hospitalization and other health complications following laparoscopic sleeve gastrectomy.   Pt stated she stayed in the hospital for 1 week following surgery (on 01/24/18) because she is on blood thinners. Pt stated blood pressure medication dosage has been reduced. Pt could not recall the exact chain of events following her bariatric surgery and week long hospitalization immediately after. Pt's sister was present during the visit and stated that following discharge, the pt was only home from the hospital for 24 hours before pt began experiencing severe pain. Pt's sister stated she took the pt back to the hospital at which time the pt underwent surgery. During hospital admission, pt's sister stated pt had ~2 L of fluid/blood drawn off.     Regarding dietary intake, pt stated she is very thirsty when she wakes up so she drinks water first thing. Pt stated she has had a hernia repair which causes a feeling of food getting "stuck" and somewhat prevents her from eating food. Pt stated she generally avoids dairy d/t diarrhea and GI upset and avoids protein drinks because she dislikes the flavor. Pt denied diarrhea since cutting out dairy. Pt reported protein foods include chicken and broiled fish. Based on pt dietary recall, pt is likely not meeting protein needs at this time. RD reviewed protein sources and the need for high protein intake, along with other information usually provided during the 2 week post op class that the pt was unable to attend.   24-hr Recall (averages 3 meals per day) Before breakfast: 4 oz water Breakfast (8am): 1 package of instant grits  Snack: 10 oz water Lunch: skinless chicken tenders (1 oz) sauteed in olive  oil, water Dinner: frozen meal (such as meatloaf), water Beverages: water, low calorie flavor packets  Goals -Aim to drink 40+ oz of water per day  -Consume protein foods every 3-5 hours every day to work towards 60 g daily  -Continue to chew foods thoroughly (to applesauce consistency) and eat meals/snacks slowly. -Look into adding iron supplement (in addition to current MVI and calcium supplements) or replace current MVI with a multivitamin that already contains iron. Remember to take calcium and iron at least 2 hours apart.    Surgery date: 01/24/2018 Surgery type: sleeve Start weight at [facility]: 326.8 Weight today: pt declined Weight change: N/A  TANITA  BODY COMP RESULTS  02/28/2018   BMI (kg/m^2) N/A   Fat Mass (lbs) N/A   Fat Free Mass (lbs) N/A   Total Body Water (lbs) N/A   The following the learning objectives were met by the patient during this course:  Identifies Phase 3A (Soft, High Proteins) Dietary Goals and will begin from 2 weeks post-operatively to 2 months post-operatively  Identifies appropriate sources of fluids and proteins   States protein recommendations and appropriate sources post-operatively  Identifies the need for appropriate texture modifications, mastication, and bite sizes when consuming solids  Identifies appropriate multivitamin and calcium sources post-operatively  Describes the need for physical activity post-operatively and will follow MD recommendations  States when to call healthcare provider regarding medication questions or post-operative complications  Handouts given during class include:  Phase 3A: Soft, High Protein Diet Handout  Follow-Up Plan: Patient will follow-up at [facility] in 2 weeks for  2 month post-op nutrition visit for diet advancement per MD.

## 2018-03-01 ENCOUNTER — Telehealth: Payer: Self-pay | Admitting: Pulmonary Disease

## 2018-03-01 NOTE — Telephone Encounter (Signed)
Called and spoke with Estill Bamberg from [facility]. She states patient has been on POC since 2017.  So no new order needed.

## 2018-03-02 DIAGNOSIS — J849 Interstitial pulmonary disease, unspecified: Secondary | ICD-10-CM | POA: Diagnosis not present

## 2018-03-02 DIAGNOSIS — N183 Chronic kidney disease, stage 3 (moderate): Secondary | ICD-10-CM | POA: Diagnosis not present

## 2018-03-02 DIAGNOSIS — Z48815 Encounter for surgical aftercare following surgery on the digestive system: Secondary | ICD-10-CM | POA: Diagnosis not present

## 2018-03-02 DIAGNOSIS — I5031 Acute diastolic (congestive) heart failure: Secondary | ICD-10-CM | POA: Diagnosis not present

## 2018-03-02 DIAGNOSIS — D62 Acute posthemorrhagic anemia: Secondary | ICD-10-CM | POA: Diagnosis not present

## 2018-03-02 DIAGNOSIS — I130 Hypertensive heart and chronic kidney disease with heart failure and stage 1 through stage 4 chronic kidney disease, or unspecified chronic kidney disease: Secondary | ICD-10-CM | POA: Diagnosis not present

## 2018-03-02 NOTE — Progress Notes (Signed)
Agree since we are status quo.

## 2018-03-06 DIAGNOSIS — D62 Acute posthemorrhagic anemia: Secondary | ICD-10-CM | POA: Diagnosis not present

## 2018-03-06 DIAGNOSIS — I5031 Acute diastolic (congestive) heart failure: Secondary | ICD-10-CM | POA: Diagnosis not present

## 2018-03-06 DIAGNOSIS — N183 Chronic kidney disease, stage 3 (moderate): Secondary | ICD-10-CM | POA: Diagnosis not present

## 2018-03-06 DIAGNOSIS — J849 Interstitial pulmonary disease, unspecified: Secondary | ICD-10-CM | POA: Diagnosis not present

## 2018-03-06 DIAGNOSIS — Z48815 Encounter for surgical aftercare following surgery on the digestive system: Secondary | ICD-10-CM | POA: Diagnosis not present

## 2018-03-06 DIAGNOSIS — I130 Hypertensive heart and chronic kidney disease with heart failure and stage 1 through stage 4 chronic kidney disease, or unspecified chronic kidney disease: Secondary | ICD-10-CM | POA: Diagnosis not present

## 2018-03-07 ENCOUNTER — Other Ambulatory Visit: Payer: Self-pay | Admitting: Cardiovascular Disease

## 2018-03-08 ENCOUNTER — Ambulatory Visit (INDEPENDENT_AMBULATORY_CARE_PROVIDER_SITE_OTHER): Payer: Medicare Other | Admitting: Pharmacist

## 2018-03-08 DIAGNOSIS — Z5181 Encounter for therapeutic drug level monitoring: Secondary | ICD-10-CM

## 2018-03-08 DIAGNOSIS — I350 Nonrheumatic aortic (valve) stenosis: Secondary | ICD-10-CM | POA: Diagnosis not present

## 2018-03-08 DIAGNOSIS — I130 Hypertensive heart and chronic kidney disease with heart failure and stage 1 through stage 4 chronic kidney disease, or unspecified chronic kidney disease: Secondary | ICD-10-CM | POA: Diagnosis not present

## 2018-03-08 DIAGNOSIS — I5031 Acute diastolic (congestive) heart failure: Secondary | ICD-10-CM | POA: Diagnosis not present

## 2018-03-08 DIAGNOSIS — N183 Chronic kidney disease, stage 3 (moderate): Secondary | ICD-10-CM | POA: Diagnosis not present

## 2018-03-08 DIAGNOSIS — J849 Interstitial pulmonary disease, unspecified: Secondary | ICD-10-CM | POA: Diagnosis not present

## 2018-03-08 DIAGNOSIS — D62 Acute posthemorrhagic anemia: Secondary | ICD-10-CM | POA: Diagnosis not present

## 2018-03-08 DIAGNOSIS — Z48815 Encounter for surgical aftercare following surgery on the digestive system: Secondary | ICD-10-CM | POA: Diagnosis not present

## 2018-03-08 LAB — POCT INR: INR: 2.7 (ref 2.0–3.0)

## 2018-03-08 NOTE — Patient Instructions (Addendum)
Description   Continue taking 1/2 tablet daily.   Recheck INR in 2 weeks. Coumadin Clinic # [phone].  (Decreased dosage until you can start eating greens)

## 2018-03-13 DIAGNOSIS — N183 Chronic kidney disease, stage 3 (moderate): Secondary | ICD-10-CM | POA: Diagnosis not present

## 2018-03-13 DIAGNOSIS — Z48815 Encounter for surgical aftercare following surgery on the digestive system: Secondary | ICD-10-CM | POA: Diagnosis not present

## 2018-03-13 DIAGNOSIS — J849 Interstitial pulmonary disease, unspecified: Secondary | ICD-10-CM | POA: Diagnosis not present

## 2018-03-13 DIAGNOSIS — I5031 Acute diastolic (congestive) heart failure: Secondary | ICD-10-CM | POA: Diagnosis not present

## 2018-03-13 DIAGNOSIS — I130 Hypertensive heart and chronic kidney disease with heart failure and stage 1 through stage 4 chronic kidney disease, or unspecified chronic kidney disease: Secondary | ICD-10-CM | POA: Diagnosis not present

## 2018-03-13 DIAGNOSIS — D62 Acute posthemorrhagic anemia: Secondary | ICD-10-CM | POA: Diagnosis not present

## 2018-03-15 ENCOUNTER — Encounter: Payer: Medicare Other | Admitting: Skilled Nursing Facility1

## 2018-03-15 ENCOUNTER — Ambulatory Visit: Payer: Self-pay | Admitting: General Surgery

## 2018-03-15 ENCOUNTER — Encounter: Payer: Self-pay | Admitting: Skilled Nursing Facility1

## 2018-03-15 DIAGNOSIS — E669 Obesity, unspecified: Secondary | ICD-10-CM

## 2018-03-15 DIAGNOSIS — Z713 Dietary counseling and surveillance: Secondary | ICD-10-CM | POA: Diagnosis not present

## 2018-03-15 DIAGNOSIS — Z9884 Bariatric surgery status: Secondary | ICD-10-CM | POA: Diagnosis not present

## 2018-03-15 NOTE — Patient Instructions (Addendum)
[email or web address]  [facility] [phone])   Look into the phycologist you worked with prior to surgery  Only eat until satisfaction, do not eat until fullness  Eat your protein first (3 ounces), then non-starchy vegetables, then complex carbohydrate   Eat every 3-5 hours 7 days a week   Do not drink any fluid while you are eating

## 2018-03-15 NOTE — Progress Notes (Signed)
  Follow-up visit:  Post-Operative Sleeve Surgery  Primary concerns today: Post-operative Bariatric Surgery Nutrition Management.  Pt is on oxygen and has heart failure. Pt is not interested in following the dietary recomendations pt states "well I cannot have any fruit or anything so I am just going to do what I want". Pt states her sleep is okay.  Pts stated goals: to get to  200 pounds and not needing her oxygen anymore. Pt admits to wanting to make changes but really having no motivation to do so. Pt states she wants to meet her goals but has not mental energy to do so blaming her depression. Dietitian asked the pt her thoughts on working with a mental health professional to which the pt stated she really does not want to tell a stranger all her business; once the dietitian explained the mental health professionals role in her care the pt seemed more open to it and her sister suggested she work with the provider she spoke with prior to surgery, pt seemed open to this. Pt was advised to make an appointment with the mental health professional and then call [facility] to make an appt.  Dietitian advanced pts diet to include all foods per pts stage and wishes.  Pt was throughly educated on timing of all food inclusion and slowing her progress towards her goals.  Surgery date: 01/24/2018 Surgery type: sleeve Start weight at [facility]: 326.8 Weight today: pt declined Weight change: N/A  24-hr recall: B (AM): 2 sausage patties Snk (AM):  L (PM): 1 hamburger patty and baked beans  Snk (PM): ice cream sandwich D (PM): 6 breaded chicken poppers  Snk (PM): ice cream sandwich  Fluid intake: water 96 ounces, sugar free lemonade  Estimated total protein intake: 60  Medications: see list Supplementation: not taking   Using straws: yes Drinking while eating: yes Having you been chewing well: yes Chewing/swallowing difficulties: no Changes in vision: no Changes to mood/headaches: no Hair loss/Cahnges to  skin/Changes to nails: no Any difficulty focusing or concentrating: no Sweating: no Dizziness/Lightheaded: no Palpitations: no  Carbonated beverages: no N/V/D/C/GAS: no Abdominal Pain: no Dumping syndrome: no  Recent physical activity:  Physical therapy is having her walk more often (1 time a week)  Handouts given during visit include:  Meal ideas      Intervention:  Nutrition counseling. Pts diet was advanced to the next phase now including all foods. Due to the bodies need for essential vitamins, minerals, and fats the pt was educated on the need to consume a certain amount of calories as well as certain nutrients daily. Pt was educated on the need for daily physical activity and to reach a goal of at least 150 minutes of moderate to vigorous physical activity as directed by their physician due to such benefits as increased musculature and improved lab values.   Goals: [email or web address].org/ [facility] [phone]) Look into the phycologist you worked with prior to surgery Only eat until satisfaction, do not eat until fullness Eat your protein first (3 ounces), then non-starchy vegetables, then complex carbohydrate  Eat every 3-5 hours 7 days a week Do not drink any fluid while you are eating  Teaching Method Utilized:  Visual Auditory Hands on  Barriers to learning/adherence to lifestyle change: emotional state/depression  Demonstrated degree of understanding via:  Teach Back   Monitoring/Evaluation:  Dietary intake, exercise, and body weight.

## 2018-03-22 ENCOUNTER — Ambulatory Visit (INDEPENDENT_AMBULATORY_CARE_PROVIDER_SITE_OTHER): Payer: Medicare Other | Admitting: *Deleted

## 2018-03-22 DIAGNOSIS — I350 Nonrheumatic aortic (valve) stenosis: Secondary | ICD-10-CM | POA: Diagnosis not present

## 2018-03-22 DIAGNOSIS — Z5181 Encounter for therapeutic drug level monitoring: Secondary | ICD-10-CM | POA: Diagnosis not present

## 2018-03-22 DIAGNOSIS — I130 Hypertensive heart and chronic kidney disease with heart failure and stage 1 through stage 4 chronic kidney disease, or unspecified chronic kidney disease: Secondary | ICD-10-CM | POA: Diagnosis not present

## 2018-03-22 DIAGNOSIS — D62 Acute posthemorrhagic anemia: Secondary | ICD-10-CM | POA: Diagnosis not present

## 2018-03-22 DIAGNOSIS — J849 Interstitial pulmonary disease, unspecified: Secondary | ICD-10-CM | POA: Diagnosis not present

## 2018-03-22 DIAGNOSIS — Z48815 Encounter for surgical aftercare following surgery on the digestive system: Secondary | ICD-10-CM | POA: Diagnosis not present

## 2018-03-22 DIAGNOSIS — N183 Chronic kidney disease, stage 3 (moderate): Secondary | ICD-10-CM | POA: Diagnosis not present

## 2018-03-22 DIAGNOSIS — I5031 Acute diastolic (congestive) heart failure: Secondary | ICD-10-CM | POA: Diagnosis not present

## 2018-03-22 LAB — POCT INR: INR: 1.9 — AB (ref 2.0–3.0)

## 2018-03-22 NOTE — Patient Instructions (Signed)
Description   Today take 1 tablet then continue taking 1/2 tablet daily.   Recheck INR in 2 weeks. Coumadin Clinic # [phone]. (Decreased dosage until you can start eating greens)

## 2018-03-29 DIAGNOSIS — Z48815 Encounter for surgical aftercare following surgery on the digestive system: Secondary | ICD-10-CM | POA: Diagnosis not present

## 2018-03-29 DIAGNOSIS — J849 Interstitial pulmonary disease, unspecified: Secondary | ICD-10-CM | POA: Diagnosis not present

## 2018-03-29 DIAGNOSIS — N183 Chronic kidney disease, stage 3 (moderate): Secondary | ICD-10-CM | POA: Diagnosis not present

## 2018-03-29 DIAGNOSIS — I5031 Acute diastolic (congestive) heart failure: Secondary | ICD-10-CM | POA: Diagnosis not present

## 2018-03-29 DIAGNOSIS — I130 Hypertensive heart and chronic kidney disease with heart failure and stage 1 through stage 4 chronic kidney disease, or unspecified chronic kidney disease: Secondary | ICD-10-CM | POA: Diagnosis not present

## 2018-03-29 DIAGNOSIS — D62 Acute posthemorrhagic anemia: Secondary | ICD-10-CM | POA: Diagnosis not present

## 2018-04-02 ENCOUNTER — Other Ambulatory Visit: Payer: Self-pay | Admitting: Nurse Practitioner
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
## 2018-04-05 ENCOUNTER — Ambulatory Visit: Payer: Medicare Other | Admitting: Psychiatry

## 2018-04-05 ENCOUNTER — Ambulatory Visit (INDEPENDENT_AMBULATORY_CARE_PROVIDER_SITE_OTHER): Payer: Medicare Other | Admitting: Nurse Practitioner

## 2018-04-05 ENCOUNTER — Encounter: Payer: Self-pay | Admitting: Nurse Practitioner

## 2018-04-05 ENCOUNTER — Telehealth: Payer: Self-pay

## 2018-04-05 ENCOUNTER — Ambulatory Visit (INDEPENDENT_AMBULATORY_CARE_PROVIDER_SITE_OTHER): Payer: Medicare Other | Admitting: *Deleted

## 2018-04-05 VITALS — BP 114/62 | HR 60 | Ht 65.0 in | Wt 285.0 lb

## 2018-04-05 DIAGNOSIS — I350 Nonrheumatic aortic (valve) stenosis: Secondary | ICD-10-CM

## 2018-04-05 DIAGNOSIS — I5032 Chronic diastolic (congestive) heart failure: Secondary | ICD-10-CM

## 2018-04-05 DIAGNOSIS — D62 Acute posthemorrhagic anemia: Secondary | ICD-10-CM | POA: Diagnosis not present

## 2018-04-05 DIAGNOSIS — Z7901 Long term (current) use of anticoagulants: Secondary | ICD-10-CM | POA: Diagnosis not present

## 2018-04-05 DIAGNOSIS — N183 Chronic kidney disease, stage 3 (moderate): Secondary | ICD-10-CM | POA: Diagnosis not present

## 2018-04-05 DIAGNOSIS — I130 Hypertensive heart and chronic kidney disease with heart failure and stage 1 through stage 4 chronic kidney disease, or unspecified chronic kidney disease: Secondary | ICD-10-CM | POA: Diagnosis not present

## 2018-04-05 DIAGNOSIS — J849 Interstitial pulmonary disease, unspecified: Secondary | ICD-10-CM | POA: Diagnosis not present

## 2018-04-05 DIAGNOSIS — Z5181 Encounter for therapeutic drug level monitoring: Secondary | ICD-10-CM | POA: Diagnosis not present

## 2018-04-05 DIAGNOSIS — Z952 Presence of prosthetic heart valve: Secondary | ICD-10-CM | POA: Diagnosis not present

## 2018-04-05 DIAGNOSIS — Z48815 Encounter for surgical aftercare following surgery on the digestive system: Secondary | ICD-10-CM | POA: Diagnosis not present

## 2018-04-05 DIAGNOSIS — I5031 Acute diastolic (congestive) heart failure: Secondary | ICD-10-CM | POA: Diagnosis not present

## 2018-04-05 DIAGNOSIS — E876 Hypokalemia: Secondary | ICD-10-CM

## 2018-04-05 LAB — BASIC METABOLIC PANEL
BUN/Creatinine Ratio: 12 (ref 9–23)
BUN: 13 mg/dL (ref 6–24)
CO2: 27 mmol/L (ref 20–29)
Calcium: 9.9 mg/dL (ref 8.7–10.2)
Chloride: 98 mmol/L (ref 96–106)
Creatinine, Ser: 1.11 mg/dL — ABNORMAL HIGH (ref 0.57–1.00)
GFR calc Af Amer: 63 mL/min/{1.73_m2} (ref >59)
GFR calc non Af Amer: 55 mL/min/{1.73_m2} — ABNORMAL LOW (ref >59)
Glucose: 100 mg/dL — ABNORMAL HIGH (ref 65–99)
Potassium: 3.2 mmol/L — ABNORMAL LOW (ref 3.5–5.2)
Sodium: 143 mmol/L (ref 134–144)

## 2018-04-05 LAB — POCT INR: INR: 1.6 — AB (ref 2.0–3.0)

## 2018-04-05 MED ORDER — POTASSIUM CHLORIDE CRYS ER 20 MEQ PO TBCR: 20.0000 meq | EXTENDED_RELEASE_TABLET | Freq: Every day | ORAL | Status: DC

## 2018-04-05 NOTE — Patient Instructions (Addendum)
We will be checking the following labs today - BMET  If you have labs (blood work) drawn today and your tests are completely normal, you will receive your results only by: Marland Kitchen MyChart Message (if you have MyChart) OR . A paper copy in the mail If you have any lab test that is abnormal or we need to change your treatment, we will call you to review the results.   Medication Instructions:    Continue with your current medicines.    If you need a refill on your cardiac medications before your next appointment, please call your pharmacy.     Testing/Procedures To Be Arranged:  N/A  Follow-Up:   See me in 2 months with fasting labs     At [facility], you and your health needs are our priority.  As part of our continuing mission to provide you with exceptional heart care, we have created designated Provider Care Teams.  These Care Teams include your primary Cardiologist (physician) and Advanced Practice Providers (APPs -  Physician Assistants and Nurse Practitioners) who all work together to provide you with the care you need, when you need it.  Special Instructions:  . Keep up the good work.   Call the [place] office at [phone] if you have any questions, problems or concerns.

## 2018-04-05 NOTE — Telephone Encounter (Signed)
Patient aware of lab results. Per [name] NP, Please call. Her labs are stable but potassium has dropped back down. Restart Kdur 20 meq daily. She may have this still at home.  BMET with next INR and otherwise, would continue on current regimen as outlined at her visit today. Patient verbalized understanding. Patient stated she had enough Kdur 20 meq at home. Will make patient a lab appointment next week on some day as coumadin clinic.

## 2018-04-05 NOTE — Patient Instructions (Signed)
Description   Today and tomorrow take 1 tablet then start taking 1/2 tablet daily except 1 tablet on Sundays.   Recheck INR in 1 week. Coumadin Clinic # [phone]. (Decreased dosage until you can start eating greens)

## 2018-04-05 NOTE — Telephone Encounter (Signed)
-----   Message from [name], NP sent at 04/05/2018  3:39 PM EST ----- Please call. Her labs are stable but potassium has dropped back down. Restart Kdur 20 meq daily. She may have this still at home.  BMET with next INR and otherwise, would continue on current regimen as outlined at her visit today.

## 2018-04-06 ENCOUNTER — Other Ambulatory Visit: Payer: Self-pay | Admitting: Pulmonary Disease

## 2018-04-13 ENCOUNTER — Ambulatory Visit (INDEPENDENT_AMBULATORY_CARE_PROVIDER_SITE_OTHER): Payer: Medicare Other

## 2018-04-13 ENCOUNTER — Other Ambulatory Visit: Payer: Medicare Other | Admitting: *Deleted

## 2018-04-13 DIAGNOSIS — J849 Interstitial pulmonary disease, unspecified: Secondary | ICD-10-CM | POA: Diagnosis not present

## 2018-04-13 DIAGNOSIS — Z5181 Encounter for therapeutic drug level monitoring: Secondary | ICD-10-CM | POA: Diagnosis not present

## 2018-04-13 DIAGNOSIS — E876 Hypokalemia: Secondary | ICD-10-CM

## 2018-04-13 DIAGNOSIS — Z48815 Encounter for surgical aftercare following surgery on the digestive system: Secondary | ICD-10-CM | POA: Diagnosis not present

## 2018-04-13 DIAGNOSIS — I5031 Acute diastolic (congestive) heart failure: Secondary | ICD-10-CM | POA: Diagnosis not present

## 2018-04-13 DIAGNOSIS — N183 Chronic kidney disease, stage 3 (moderate): Secondary | ICD-10-CM | POA: Diagnosis not present

## 2018-04-13 DIAGNOSIS — I350 Nonrheumatic aortic (valve) stenosis: Secondary | ICD-10-CM | POA: Diagnosis not present

## 2018-04-13 DIAGNOSIS — I130 Hypertensive heart and chronic kidney disease with heart failure and stage 1 through stage 4 chronic kidney disease, or unspecified chronic kidney disease: Secondary | ICD-10-CM | POA: Diagnosis not present

## 2018-04-13 DIAGNOSIS — D62 Acute posthemorrhagic anemia: Secondary | ICD-10-CM | POA: Diagnosis not present

## 2018-04-13 LAB — BASIC METABOLIC PANEL
BUN/Creatinine Ratio: 13 (ref 9–23)
BUN: 14 mg/dL (ref 6–24)
CO2: 27 mmol/L (ref 20–29)
Calcium: 10.4 mg/dL — ABNORMAL HIGH (ref 8.7–10.2)
Chloride: 100 mmol/L (ref 96–106)
Creatinine, Ser: 1.07 mg/dL — ABNORMAL HIGH (ref 0.57–1.00)
GFR calc Af Amer: 66 mL/min/{1.73_m2} (ref >59)
GFR calc non Af Amer: 57 mL/min/{1.73_m2} — ABNORMAL LOW (ref >59)
Glucose: 102 mg/dL — ABNORMAL HIGH (ref 65–99)
Potassium: 3.7 mmol/L (ref 3.5–5.2)
Sodium: 143 mmol/L (ref 134–144)

## 2018-04-13 LAB — POCT INR: INR: 1.6 — AB (ref 2.0–3.0)

## 2018-04-13 NOTE — Patient Instructions (Signed)
Description   Take 1 tablet today, then start taking 1/2 tablet daily except 1 tablet on Mondays and Fridays.   Recheck INR in 1 week. Coumadin Clinic # [phone]. (Decreased dosage until you can start eating greens)

## 2018-04-17 ENCOUNTER — Telehealth: Payer: Self-pay | Admitting: Skilled Nursing Facility1

## 2018-04-17 NOTE — Telephone Encounter (Signed)
Dietitian called pt to make an appt and see if she reached her goal of making an appt with a mental health provider.  Pt states she did not make an appointment with a mental health provider and read bariatric surgery for dummies instead and feels much better.   Pt states she will call back to make the appt with the dietitian.

## 2018-04-18 DIAGNOSIS — N183 Chronic kidney disease, stage 3 (moderate): Secondary | ICD-10-CM | POA: Diagnosis not present

## 2018-04-18 DIAGNOSIS — M1A9XX0 Chronic gout, unspecified, without tophus (tophi): Secondary | ICD-10-CM | POA: Diagnosis not present

## 2018-04-18 DIAGNOSIS — J449 Chronic obstructive pulmonary disease, unspecified: Secondary | ICD-10-CM | POA: Diagnosis not present

## 2018-04-18 DIAGNOSIS — Z79899 Other long term (current) drug therapy: Secondary | ICD-10-CM | POA: Diagnosis not present

## 2018-04-18 DIAGNOSIS — Z9884 Bariatric surgery status: Secondary | ICD-10-CM | POA: Insufficient documentation

## 2018-04-18 DIAGNOSIS — I5032 Chronic diastolic (congestive) heart failure: Secondary | ICD-10-CM | POA: Diagnosis not present

## 2018-04-18 DIAGNOSIS — I4821 Permanent atrial fibrillation: Secondary | ICD-10-CM | POA: Diagnosis not present

## 2018-04-18 DIAGNOSIS — E785 Hyperlipidemia, unspecified: Secondary | ICD-10-CM | POA: Diagnosis not present

## 2018-04-18 DIAGNOSIS — R7301 Impaired fasting glucose: Secondary | ICD-10-CM | POA: Diagnosis not present

## 2018-04-18 DIAGNOSIS — I10 Essential (primary) hypertension: Secondary | ICD-10-CM | POA: Diagnosis not present

## 2018-04-20 ENCOUNTER — Ambulatory Visit (INDEPENDENT_AMBULATORY_CARE_PROVIDER_SITE_OTHER): Payer: Medicare Other | Admitting: *Deleted

## 2018-04-20 DIAGNOSIS — Z5181 Encounter for therapeutic drug level monitoring: Secondary | ICD-10-CM | POA: Diagnosis not present

## 2018-04-20 DIAGNOSIS — I350 Nonrheumatic aortic (valve) stenosis: Secondary | ICD-10-CM | POA: Diagnosis not present

## 2018-04-20 LAB — POCT INR: INR: 2 (ref 2.0–3.0)

## 2018-04-20 NOTE — Patient Instructions (Signed)
Description   Take 1 tablet today, then start taking 1/2 tablet daily except 1 tablet on Mondays, Wednesdays, and Fridays.   Recheck INR in 1.5 weeks. Coumadin Clinic # [phone]. (Decreased dosage until you can start eating greens)

## 2018-05-01 ENCOUNTER — Ambulatory Visit (INDEPENDENT_AMBULATORY_CARE_PROVIDER_SITE_OTHER): Payer: Medicare Other | Admitting: *Deleted

## 2018-05-01 DIAGNOSIS — Z5181 Encounter for therapeutic drug level monitoring: Secondary | ICD-10-CM | POA: Diagnosis not present

## 2018-05-01 DIAGNOSIS — I350 Nonrheumatic aortic (valve) stenosis: Secondary | ICD-10-CM

## 2018-05-01 LAB — POCT INR: INR: 2.9 (ref 2.0–3.0)

## 2018-05-01 NOTE — Patient Instructions (Signed)
Description   Continue taking 1/2 tablet daily except 1 tablet on Mondays, Wednesdays, and Fridays.   Recheck INR in 3 weeks. Coumadin Clinic # [phone]. (Decreased dosage until you can start eating greens)

## 2018-05-22 ENCOUNTER — Ambulatory Visit (INDEPENDENT_AMBULATORY_CARE_PROVIDER_SITE_OTHER): Payer: Medicare Other

## 2018-05-22 DIAGNOSIS — I350 Nonrheumatic aortic (valve) stenosis: Secondary | ICD-10-CM

## 2018-05-22 DIAGNOSIS — Z5181 Encounter for therapeutic drug level monitoring: Secondary | ICD-10-CM | POA: Diagnosis not present

## 2018-05-22 LAB — POCT INR: INR: 2.4 (ref 2.0–3.0)

## 2018-05-22 NOTE — Patient Instructions (Signed)
Description   Take 1.5 tablets today, then start taking 1/2 tablet daily except 1 tablet on Mondays, Wednesdays, Fridays, and Saturday.   Recheck INR in 3 weeks. Coumadin Clinic # [phone]. (Decreased dosage until you can start eating greens)

## 2018-06-05 ENCOUNTER — Encounter: Payer: Self-pay | Admitting: Nurse Practitioner

## 2018-06-05 ENCOUNTER — Ambulatory Visit (INDEPENDENT_AMBULATORY_CARE_PROVIDER_SITE_OTHER): Payer: Medicare Other | Admitting: Nurse Practitioner

## 2018-06-05 ENCOUNTER — Ambulatory Visit (INDEPENDENT_AMBULATORY_CARE_PROVIDER_SITE_OTHER): Payer: Medicare Other | Admitting: *Deleted

## 2018-06-05 VITALS — BP 130/80 | HR 60 | Ht 65.0 in | Wt 272.1 lb

## 2018-06-05 DIAGNOSIS — I350 Nonrheumatic aortic (valve) stenosis: Secondary | ICD-10-CM | POA: Diagnosis not present

## 2018-06-05 DIAGNOSIS — E7849 Other hyperlipidemia: Secondary | ICD-10-CM

## 2018-06-05 DIAGNOSIS — I5032 Chronic diastolic (congestive) heart failure: Secondary | ICD-10-CM | POA: Diagnosis not present

## 2018-06-05 DIAGNOSIS — Z9889 Other specified postprocedural states: Secondary | ICD-10-CM | POA: Diagnosis not present

## 2018-06-05 DIAGNOSIS — Z79899 Other long term (current) drug therapy: Secondary | ICD-10-CM | POA: Diagnosis not present

## 2018-06-05 DIAGNOSIS — Z952 Presence of prosthetic heart valve: Secondary | ICD-10-CM

## 2018-06-05 DIAGNOSIS — Z5181 Encounter for therapeutic drug level monitoring: Secondary | ICD-10-CM | POA: Diagnosis not present

## 2018-06-05 LAB — POCT INR: INR: 2.9 (ref 2.0–3.0)
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
## 2018-06-05 NOTE — Patient Instructions (Addendum)
We will be checking the following labs today - BMET & CBC, HPF and lipids     Medication Instructions:    Continue with your current medicines.    If you need a refill on your cardiac medications before your next appointment, please call your pharmacy.     Testing/Procedures To Be Arranged:  N/A  Follow-Up:   See me in 4 months    At [facility], you and your health needs are our priority.  As part of our continuing mission to provide you with exceptional heart care, we have created designated Provider Care Teams.  These Care Teams include your primary Cardiologist (physician) and Advanced Practice Providers (APPs -  Physician Assistants and Nurse Practitioners) who all work together to provide you with the care you need, when you need it.  Special Instructions:  . None  Call the [place] office at [phone] if you have any questions, problems or concerns.

## 2018-06-05 NOTE — Patient Instructions (Addendum)
Description   Continue  taking 1 tablet daily except 1/2 tablet on Tuesdays, Thursdays and Sundays.  Recheck INR in 3 weeks. Coumadin Clinic # [phone].

## 2018-06-06 LAB — CBC
Hematocrit: 41.8 % (ref 34.0–46.6)
Hemoglobin: 14.2 g/dL (ref 11.1–15.9)
MCH: 31.3 pg (ref 26.6–33.0)
MCHC: 34 g/dL (ref 31.5–35.7)
MCV: 92 fL (ref 79–97)
Platelets: 226 10*3/uL (ref 150–450)
RBC: 4.54 x10E6/uL (ref 3.77–5.28)
RDW: 14.8 % (ref 11.7–15.4)
WBC: 6.6 10*3/uL (ref 3.4–10.8)

## 2018-06-06 LAB — BASIC METABOLIC PANEL
BUN/Creatinine Ratio: 14 (ref 9–23)
BUN: 19 mg/dL (ref 6–24)
CO2: 26 mmol/L (ref 20–29)
Calcium: 10 mg/dL (ref 8.7–10.2)
Chloride: 95 mmol/L — ABNORMAL LOW (ref 96–106)
Creatinine, Ser: 1.39 mg/dL — ABNORMAL HIGH (ref 0.57–1.00)
GFR calc Af Amer: 48 mL/min/{1.73_m2} — ABNORMAL LOW (ref >59)
GFR calc non Af Amer: 42 mL/min/{1.73_m2} — ABNORMAL LOW (ref >59)
Glucose: 96 mg/dL (ref 65–99)
Potassium: 4.2 mmol/L (ref 3.5–5.2)
Sodium: 138 mmol/L (ref 134–144)

## 2018-06-06 LAB — LIPID PANEL
Chol/HDL Ratio: 4.1 ratio (ref 0.0–4.4)
Cholesterol, Total: 207 mg/dL — ABNORMAL HIGH (ref 100–199)
HDL: 51 mg/dL (ref >39)
LDL Calculated: 108 mg/dL — ABNORMAL HIGH (ref 0–99)
Triglycerides: 238 mg/dL — ABNORMAL HIGH (ref 0–149)
VLDL Cholesterol Cal: 48 mg/dL — ABNORMAL HIGH (ref 5–40)

## 2018-06-06 LAB — HEPATIC FUNCTION PANEL
ALT: 18 IU/L (ref 0–32)
AST: 33 IU/L (ref 0–40)
Albumin: 4.5 g/dL (ref 3.5–5.5)
Alkaline Phosphatase: 92 IU/L (ref 39–117)
Bilirubin Total: 0.5 mg/dL (ref 0.0–1.2)
Bilirubin, Direct: 0.16 mg/dL (ref 0.00–0.40)
Total Protein: 7.5 g/dL (ref 6.0–8.5)

## 2018-06-21 DIAGNOSIS — Z6841 Body Mass Index (BMI) 40.0 and over, adult: Secondary | ICD-10-CM | POA: Diagnosis not present

## 2018-06-21 DIAGNOSIS — Z9884 Bariatric surgery status: Secondary | ICD-10-CM | POA: Diagnosis not present

## 2018-06-21 DIAGNOSIS — I10 Essential (primary) hypertension: Secondary | ICD-10-CM | POA: Diagnosis not present

## 2018-06-21 DIAGNOSIS — Z7901 Long term (current) use of anticoagulants: Secondary | ICD-10-CM | POA: Diagnosis not present

## 2018-06-21 DIAGNOSIS — D751 Secondary polycythemia: Secondary | ICD-10-CM | POA: Diagnosis not present

## 2018-06-21 DIAGNOSIS — D649 Anemia, unspecified: Secondary | ICD-10-CM | POA: Diagnosis not present

## 2018-06-21 DIAGNOSIS — J849 Interstitial pulmonary disease, unspecified: Secondary | ICD-10-CM | POA: Diagnosis not present

## 2018-06-26 ENCOUNTER — Other Ambulatory Visit: Payer: Self-pay | Admitting: Nurse Practitioner

## 2018-06-26 ENCOUNTER — Ambulatory Visit (INDEPENDENT_AMBULATORY_CARE_PROVIDER_SITE_OTHER): Payer: Medicare Other | Admitting: *Deleted

## 2018-06-26 DIAGNOSIS — Z5181 Encounter for therapeutic drug level monitoring: Secondary | ICD-10-CM | POA: Diagnosis not present

## 2018-06-26 DIAGNOSIS — I350 Nonrheumatic aortic (valve) stenosis: Secondary | ICD-10-CM

## 2018-06-26 DIAGNOSIS — Z1231 Encounter for screening mammogram for malignant neoplasm of breast: Secondary | ICD-10-CM

## 2018-06-26 LAB — POCT INR: INR: 2.6 (ref 2.0–3.0)

## 2018-06-26 NOTE — Patient Instructions (Signed)
Description   Continue taking 1 tablet daily except 1/2 tablet on Tuesdays, Thursdays and Sundays.  Recheck INR in 4 weeks. Coumadin Clinic # [phone].

## 2018-06-27 ENCOUNTER — Encounter: Payer: Medicare Other | Attending: General Surgery | Admitting: Dietician

## 2018-06-27 DIAGNOSIS — E669 Obesity, unspecified: Secondary | ICD-10-CM | POA: Insufficient documentation

## 2018-06-27 DIAGNOSIS — R5383 Other fatigue: Secondary | ICD-10-CM | POA: Diagnosis not present

## 2018-06-27 NOTE — Patient Instructions (Signed)
   Resume taking a bariatric multivitamin (capsule is okay) and calcium 3 times per day.   Continue chewing well, prioritizing protein foods and non-starchy vegetables, not drinking with meals, and eating small meals/snacks throughout the day.   Keep up the walking!    Great progress thus far, keep up the good work!

## 2018-06-27 NOTE — Progress Notes (Signed)
Bariatric Follow-Up Visit Medical Nutrition Therapy  Appt Start Time: 10:30am End Time: 10:55am  5 Months Post-Operative Sleeve Gastrectomy Surgery  Primary Concerns Today: Bariatric Follow Up. Pt states she is no longer on oxygen. Pt states the first 3 months after surgery were very rough (physically and mentally.) Pt states the last 2 months have been much better since being more active, losing weight, coming off of oxygen, and seeking mental health professional help.    NUTRITION ASSESSMENT  Anthropometrics  Start weight at [facility]: 326.8 lbs (date: 04/28/2017) Today's weight: pt declined (states she has lost ~75 lbs since surgery on 01/24/2018)  Clinical Medical Hx: obesity, chronic diastolic heart failure, interstitial lung disease/asthma, CKD, HTN, on chronic oxygen, valvular heart disease s/p aortic and mitral valve replacement on chronic Coumadin  24-Hr Dietary Recall First Meal: 1 egg (or grits, or 1 [place] sausage patty)  Snack: [place] pepperonis + wheat crackers   Second Meal: small salad (pepper + cucumber + greens) + diced chicken breast  Snack: Goldfish crackers   Third Meal: ground [place] + spaghetti sauce (or zucchini meatballs) Snack: rarely  Beverages: water + Crystal Light lemonade   Food & Nutrition Related Hx Dietary Hx: Pt states she eats a lot of chicken and ground [place]. Pt states she avoids caffeinated/carbonated/sugar-sweetened beverages and alcohol. Pt states she cannot consume dairy because it upsets her stomach but wishes she could eat yogurt. RD advised trying plant-based milks and yogurts or lactose-free versions.  Supplements: "Alive for Women" (big pill, cut in half) not taking bariatric MVI or calcium Estimated Daily Fluid Intake: 100 oz Estimated Daily Protein Intake: 60+ g  Physical Activity  Current average weekly physical activity: walking 10-15 mins/day    Post-Op Goals Using straws: no Drinking while eating: no Chewing/swallowing  difficulties: no Changes in vision: no Changes to mood/headaches: no Hair loss/changes to skin/nails: nails cracked Difficulty focusing/concentrating: no Sweating: no Dizziness/lightheadedness: no Palpitations: no  Carbonated/caffeinated beverages: yes (Sprite occasionally)  N/V/D/C/Gas: no Abdominal pain: no Dumping syndrome: no Last Lap-Band fill: N/A   NUTRITION DIAGNOSIS  Overweight/obesity ([place]-3.3) related to past poor dietary habits and physical inactivity as evidenced by patient w/ completed Sleeve Gastrectomy surgery following dietary guidelines for continued weight loss.   NUTRITION INTERVENTION Nutrition counseling (C-1) and education (E-2) to facilitate bariatric surgery goals, including: . Diet consisting mainly of protein foods and non-starchy vegetables. Discussed incorporation of starchy vegetables and keeping carbohydrates/fats to a minimum.  . The importance of consuming adequate calories as well as certain nutrients daily due to the body's need for essential vitamins, minerals, and fats . The importance of daily physical activity and to reach a goal of at least 150 minutes of moderate to vigorous physical activity weekly (or as directed by their physician) due to benefits such as increased musculature and improved lab values  Handouts Provided Include   Bariatric Support Group 2020 Schedule   Bariatric Snack Ideas   Phase V: Protein + All Vegetables   [employer] coupons   Learning Style & Readiness for Change Teaching method utilized: Visual & Auditory  Demonstrated degree of understanding via: Teach Back  Barriers to learning/adherence to lifestyle change: Contemplative Stage of Change  RD's Notes for Next Visit . Make sure pt is taking bariatric MVI and calcium supplements again.  . Set new goals for food and physical activity.  . Encourage increased physical activity.    MONITORING & EVALUATION Dietary intake, weekly physical activity, and body weight  in 3 to 4 months.  Next Steps Patient is to follow-up in approximately 3-4 months for 9 month post-op visit. Patient was encouraged to return in 1 month to attend 6 Month Follow Up Class, but patient declined. Patient requested a specific follow up date to accommodate her other health appointments around that time.

## 2018-07-27 ENCOUNTER — Ambulatory Visit (INDEPENDENT_AMBULATORY_CARE_PROVIDER_SITE_OTHER): Payer: Medicare Other | Admitting: *Deleted

## 2018-07-27 DIAGNOSIS — I350 Nonrheumatic aortic (valve) stenosis: Secondary | ICD-10-CM

## 2018-07-27 DIAGNOSIS — Z5181 Encounter for therapeutic drug level monitoring: Secondary | ICD-10-CM

## 2018-07-27 LAB — POCT INR: INR: 2 (ref 2.0–3.0)

## 2018-07-27 NOTE — Patient Instructions (Signed)
Description   Today take 1 tablet of Coumadin then continue taking 1 tablet daily except 1/2 tablet on Tuesdays, Thursdays and Sundays.  Recheck INR in 3 weeks. Coumadin Clinic # [phone].

## 2018-08-02 DIAGNOSIS — N183 Chronic kidney disease, stage 3 (moderate): Secondary | ICD-10-CM | POA: Diagnosis not present

## 2018-08-02 DIAGNOSIS — M25511 Pain in right shoulder: Secondary | ICD-10-CM | POA: Diagnosis not present

## 2018-08-08 ENCOUNTER — Ambulatory Visit: Payer: Self-pay

## 2018-08-09 ENCOUNTER — Other Ambulatory Visit: Payer: Self-pay | Admitting: Interventional Cardiology

## 2018-08-14 ENCOUNTER — Ambulatory Visit: Payer: Self-pay

## 2018-08-14 ENCOUNTER — Other Ambulatory Visit: Payer: Self-pay

## 2018-08-14 ENCOUNTER — Ambulatory Visit (INDEPENDENT_AMBULATORY_CARE_PROVIDER_SITE_OTHER): Payer: Medicare Other

## 2018-08-14 ENCOUNTER — Ambulatory Visit
Admission: RE | Admit: 2018-08-14 | Discharge: 2018-08-14 | Disposition: A | Discharge status: Home / Self Care | Payer: Medicare Other | Source: Ambulatory Visit | Attending: Nurse Practitioner | Admitting: Nurse Practitioner

## 2018-08-14 DIAGNOSIS — N2581 Secondary hyperparathyroidism of renal origin: Secondary | ICD-10-CM | POA: Diagnosis not present

## 2018-08-14 DIAGNOSIS — I129 Hypertensive chronic kidney disease with stage 1 through stage 4 chronic kidney disease, or unspecified chronic kidney disease: Secondary | ICD-10-CM | POA: Diagnosis not present

## 2018-08-14 DIAGNOSIS — Z5181 Encounter for therapeutic drug level monitoring: Secondary | ICD-10-CM | POA: Diagnosis not present

## 2018-08-14 DIAGNOSIS — D631 Anemia in chronic kidney disease: Secondary | ICD-10-CM | POA: Diagnosis not present

## 2018-08-14 DIAGNOSIS — Z1231 Encounter for screening mammogram for malignant neoplasm of breast: Secondary | ICD-10-CM

## 2018-08-14 DIAGNOSIS — I350 Nonrheumatic aortic (valve) stenosis: Secondary | ICD-10-CM

## 2018-08-14 DIAGNOSIS — N183 Chronic kidney disease, stage 3 (moderate): Secondary | ICD-10-CM | POA: Diagnosis not present

## 2018-08-14 LAB — POCT INR: INR: 1.9 — AB (ref 2.0–3.0)

## 2018-08-14 NOTE — Patient Instructions (Signed)
Description   Take 1.5 tablets today, then start taking 1 tablet daily except 1/2 tablet on Sundays and Thursdays.  Recheck INR in 3 weeks. Coumadin Clinic # [phone].

## 2018-08-31 ENCOUNTER — Telehealth: Payer: Self-pay | Admitting: Pharmacist

## 2018-08-31 ENCOUNTER — Telehealth: Payer: Self-pay

## 2018-08-31 NOTE — Telephone Encounter (Signed)

## 2018-08-31 NOTE — Telephone Encounter (Signed)
lmom for prescreen/drive thru 

## 2018-09-04 ENCOUNTER — Ambulatory Visit (INDEPENDENT_AMBULATORY_CARE_PROVIDER_SITE_OTHER): Payer: Medicare Other | Admitting: Pharmacist Clinician (PhC)/ Clinical Pharmacy Specialist

## 2018-09-04 ENCOUNTER — Other Ambulatory Visit: Payer: Self-pay

## 2018-09-04 DIAGNOSIS — Z5181 Encounter for therapeutic drug level monitoring: Secondary | ICD-10-CM | POA: Diagnosis not present

## 2018-09-04 DIAGNOSIS — I350 Nonrheumatic aortic (valve) stenosis: Secondary | ICD-10-CM | POA: Diagnosis not present

## 2018-09-04 DIAGNOSIS — Z7901 Long term (current) use of anticoagulants: Secondary | ICD-10-CM | POA: Diagnosis not present

## 2018-09-04 LAB — POCT INR: INR: 1.7 — AB (ref 2.0–3.0)

## 2018-09-15 ENCOUNTER — Telehealth: Payer: Self-pay

## 2018-09-15 NOTE — Telephone Encounter (Signed)

## 2018-09-15 NOTE — Telephone Encounter (Signed)
lmom for prescreen  

## 2018-09-18 ENCOUNTER — Other Ambulatory Visit: Payer: Self-pay

## 2018-09-18 ENCOUNTER — Ambulatory Visit (INDEPENDENT_AMBULATORY_CARE_PROVIDER_SITE_OTHER): Payer: Medicare Other | Admitting: *Deleted

## 2018-09-18 DIAGNOSIS — I350 Nonrheumatic aortic (valve) stenosis: Secondary | ICD-10-CM

## 2018-09-18 DIAGNOSIS — Z5181 Encounter for therapeutic drug level monitoring: Secondary | ICD-10-CM | POA: Diagnosis not present

## 2018-09-18 LAB — POCT INR: INR: 2.1 (ref 2.0–3.0)

## 2018-09-27 ENCOUNTER — Telehealth: Payer: Self-pay

## 2018-09-27 NOTE — Telephone Encounter (Signed)
lmom for prescreen  

## 2018-09-28 ENCOUNTER — Other Ambulatory Visit: Payer: Self-pay

## 2018-09-28 ENCOUNTER — Ambulatory Visit (INDEPENDENT_AMBULATORY_CARE_PROVIDER_SITE_OTHER): Payer: Medicare Other

## 2018-09-28 DIAGNOSIS — I350 Nonrheumatic aortic (valve) stenosis: Secondary | ICD-10-CM | POA: Diagnosis not present

## 2018-09-28 DIAGNOSIS — Z5181 Encounter for therapeutic drug level monitoring: Secondary | ICD-10-CM | POA: Diagnosis not present

## 2018-09-28 LAB — POCT INR: INR: 3 (ref 2.0–3.0)

## 2018-09-28 NOTE — Patient Instructions (Signed)
Description   Called spoke with pt, advised to continue on same dosage 1 tablet daily.  Recheck INR in 3 weeks in the [place] office. Coumadin Clinic # [phone].

## 2018-10-09 ENCOUNTER — Telehealth: Payer: Self-pay | Admitting: *Deleted

## 2018-10-09 NOTE — Telephone Encounter (Signed)
Virtual Visit Pre-Appointment Phone Call  "(Name), I am calling you today to discuss your upcoming appointment. We are currently trying to limit exposure to the virus that causes COVID-19 by seeing patients at home rather than in the office."  1. "What is the BEST phone number to call the day of the visit?" - include this in appointment notes  2. "Do you have or have access to (through a family member/friend) a smartphone with video capability that we can use for your visit?" a. If yes - list this number in appt notes as "cell" (if different from BEST phone #) and list the appointment type as a VIDEO visit in appointment notes b. If no - list the appointment type as a PHONE visit in appointment notes  3. Confirm consent - "In the setting of the current Covid19 crisis, you are scheduled for a (phone or video) visit with your provider on (Tuesday, May 12) at (8:00 am).  Just as we do with many in-office visits, in order for you to participate in this visit, we must obtain consent.  If you'd like, I can send this to your mychart (if signed up) or email for you to review.  Otherwise, I can obtain your verbal consent now.  All virtual visits are billed to your insurance company just like a normal visit would be.  By agreeing to a virtual visit, we'd like you to understand that the technology does not allow for your provider to perform an examination, and thus may limit your provider's ability to fully assess your condition. If your provider identifies any concerns that need to be evaluated in person, we will make arrangements to do so.  Finally, though the technology is pretty good, we cannot assure that it will always work on either your or our end, and in the setting of a video visit, we may have to convert it to a phone-only visit.  In either situation, we cannot ensure that we have a secure connection.  Are you willing to proceed?" STAFF: Did the patient verbally acknowledge consent to telehealth  visit? Document YES/NO here: YES.  4. Advise patient to be prepared - "Two hours prior to your appointment, go ahead and check your blood pressure, pulse, oxygen saturation, and your weight (if you have the equipment to check those) and write them all down. When your visit starts, your provider will ask you for this information. If you have an Apple Watch or Kardia device, please plan to have heart rate information ready on the day of your appointment. Please have a pen and paper handy nearby the day of the visit as well."  5. Give patient instructions for MyChart download to smartphone OR Doximity/Doxy.me as below if video visit (depending on what platform provider is using)  6. Inform patient they will receive a phone call 15 minutes prior to their appointment time (may be from unknown caller ID) so they should be prepared to answer    TELEPHONE CALL NOTE  [name] has been deemed a candidate for a follow-up tele-health visit to limit community exposure during the Covid-19 pandemic. I spoke with the patient via phone to ensure availability of phone/video source, confirm preferred email & phone number, and discuss instructions and expectations.  I reminded [name] to be prepared with any vital sign and/or heart rhythm information that could potentially be obtained via home monitoring, at the time of her visit. I reminded [name] to expect a phone  call prior to her visit.  Troi Bechtold Avanell Shackleton 10/09/2018 8:17 AM   INSTRUCTIONS FOR DOWNLOADING THE MYCHART APP TO SMARTPHONE  - The patient must first make sure to have activated MyChart and know their login information - If Apple, go to App Store and type in MyChart in the search bar and download the app. If Android, ask patient to go to [employer] and type in [place] in the search bar and download the app. The app is free but as with any other app downloads, their phone may require them to verify saved payment  information or Apple/Android password.  - The patient will need to then log into the app with their MyChart username and password, and select [facility] as their healthcare provider to link the account. When it is time for your visit, go to the MyChart app, find appointments, and click Begin Video Visit. Be sure to Select Allow for your device to access the Microphone and Camera for your visit. You will then be connected, and your provider will be with you shortly.  **If they have any issues connecting, or need assistance please contact MyChart service desk [phone])**  **If using a computer, in order to ensure the best quality for their visit they will need to use either of the following Internet Browsers: [employer], or Google Chrome**  IF USING DOXIMITY or DOXY.ME - The patient will receive a link just prior to their visit by text.     FULL LENGTH CONSENT FOR TELE-HEALTH VISIT   I hereby voluntarily request, consent and authorize [facility] and its employed or contracted physicians, physician assistants, nurse practitioners or other licensed health care professionals (the Practitioner), to provide me with telemedicine health care services (the "Services") as deemed necessary by the treating Practitioner. I acknowledge and consent to receive the Services by the Practitioner via telemedicine. I understand that the telemedicine visit will involve communicating with the Practitioner through live audiovisual communication technology and the disclosure of certain medical information by electronic transmission. I acknowledge that I have been given the opportunity to request an in-person assessment or other available alternative prior to the telemedicine visit and am voluntarily participating in the telemedicine visit.  I understand that I have the right to withhold or withdraw my consent to the use of telemedicine in the course of my care at any time, without affecting my right  to future care or treatment, and that the Practitioner or I may terminate the telemedicine visit at any time. I understand that I have the right to inspect all information obtained and/or recorded in the course of the telemedicine visit and may receive copies of available information for a reasonable fee.  I understand that some of the potential risks of receiving the Services via telemedicine include:  Marland Kitchen Delay or interruption in medical evaluation due to technological equipment failure or disruption; . Information transmitted may not be sufficient (e.g. poor resolution of images) to allow for appropriate medical decision making by the Practitioner; and/or  . In rare instances, security protocols could fail, causing a breach of personal health information.  Furthermore, I acknowledge that it is my responsibility to provide information about my medical history, conditions and care that is complete and accurate to the best of my ability. I acknowledge that Practitioner's advice, recommendations, and/or decision may be based on factors not within their control, such as incomplete or inaccurate data provided by me or distortions of diagnostic images or specimens that may result from  electronic transmissions. I understand that the practice of medicine is not an exact science and that Practitioner makes no warranties or guarantees regarding treatment outcomes. I acknowledge that I will receive a copy of this consent concurrently upon execution via email to the email address I last provided but may also request a printed copy by calling the office of [facility].    I understand that my insurance will be billed for this visit.   I have read or had this consent read to me. . I understand the contents of this consent, which adequately explains the benefits and risks of the Services being provided via telemedicine.  . I have been provided ample opportunity to ask questions regarding this consent and the Services  and have had my questions answered to my satisfaction. . I give my informed consent for the services to be provided through the use of telemedicine in my medical care  By participating in this telemedicine visit I agree to the above.
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
## 2018-10-10 ENCOUNTER — Ambulatory Visit: Payer: Self-pay | Admitting: Dietician

## 2018-10-10 ENCOUNTER — Encounter: Payer: Self-pay | Admitting: Nurse Practitioner

## 2018-10-10 ENCOUNTER — Other Ambulatory Visit: Payer: Self-pay

## 2018-10-10 ENCOUNTER — Telehealth (INDEPENDENT_AMBULATORY_CARE_PROVIDER_SITE_OTHER): Payer: Medicare Other | Admitting: Nurse Practitioner

## 2018-10-10 VITALS — BP 122/80 | HR 68 | Wt 275.0 lb

## 2018-10-10 DIAGNOSIS — I10 Essential (primary) hypertension: Secondary | ICD-10-CM

## 2018-10-10 DIAGNOSIS — I5032 Chronic diastolic (congestive) heart failure: Secondary | ICD-10-CM

## 2018-10-10 DIAGNOSIS — Z952 Presence of prosthetic heart valve: Secondary | ICD-10-CM

## 2018-10-10 DIAGNOSIS — Z79899 Other long term (current) drug therapy: Secondary | ICD-10-CM

## 2018-10-10 DIAGNOSIS — Z7901 Long term (current) use of anticoagulants: Secondary | ICD-10-CM

## 2018-10-10 DIAGNOSIS — Z7189 Other specified counseling: Secondary | ICD-10-CM

## 2018-10-10 NOTE — Patient Instructions (Addendum)
After Visit Summary:  We will be checking the following labs today - NONE   Medication Instructions:    Continue with your current medicines.    If you need a refill on your cardiac medications before your next appointment, please call your pharmacy.     Testing/Procedures To Be Arranged:  Echocardiogram inSeptember of 2020 for follow up of valvular heart disease  Follow-Up:   See me about a week or so after the echocardiogram. We will do fasting labs as that visit as well.    At [facility], you and your health needs are our priority.  As part of our continuing mission to provide you with exceptional heart care, we have created designated Provider Care Teams.  These Care Teams include your primary Cardiologist (physician) and Advanced Practice Providers (APPs -  Physician Assistants and Nurse Practitioners) who all work together to provide you with the care you need, when you need it.  Special Instructions:  . Stay safe, stay home, wash your hands for at least 20 seconds and wear a mask when out in public.  . It was good to talk with you today. Marland Kitchen Hopefully you can get back to the gym soon!   Call the [place] office at [phone] if you have any questions, problems or concerns.

## 2018-10-11 ENCOUNTER — Other Ambulatory Visit: Payer: Self-pay

## 2018-10-11 MED ORDER — POTASSIUM CHLORIDE CRYS ER 20 MEQ PO TBCR: 20.0000 meq | EXTENDED_RELEASE_TABLET | Freq: Every day | ORAL | 3 refills | Status: DC

## 2018-10-16 ENCOUNTER — Telehealth: Payer: Self-pay | Admitting: Pharmacist

## 2018-10-16 NOTE — Telephone Encounter (Signed)

## 2018-10-17 ENCOUNTER — Ambulatory Visit (INDEPENDENT_AMBULATORY_CARE_PROVIDER_SITE_OTHER): Payer: Medicare Other | Admitting: *Deleted

## 2018-10-17 ENCOUNTER — Other Ambulatory Visit: Payer: Self-pay

## 2018-10-17 DIAGNOSIS — Z5181 Encounter for therapeutic drug level monitoring: Secondary | ICD-10-CM | POA: Diagnosis not present

## 2018-10-17 DIAGNOSIS — I350 Nonrheumatic aortic (valve) stenosis: Secondary | ICD-10-CM

## 2018-10-17 LAB — POCT INR: INR: 3 (ref 2.0–3.0)

## 2018-10-17 NOTE — Patient Instructions (Signed)
Description   Called spoke with pt, advised to continue on same dosage 1 tablet daily.  Recheck INR in 4 weeks in the [place] office. Coumadin Clinic # [phone].

## 2018-11-08 ENCOUNTER — Telehealth: Payer: Self-pay

## 2018-11-08 ENCOUNTER — Other Ambulatory Visit: Payer: Self-pay | Admitting: Interventional Cardiology

## 2018-11-08 NOTE — Telephone Encounter (Signed)

## 2018-11-14 ENCOUNTER — Ambulatory Visit (INDEPENDENT_AMBULATORY_CARE_PROVIDER_SITE_OTHER): Payer: Medicare Other | Admitting: *Deleted

## 2018-11-14 ENCOUNTER — Other Ambulatory Visit: Payer: Self-pay

## 2018-11-14 DIAGNOSIS — I350 Nonrheumatic aortic (valve) stenosis: Secondary | ICD-10-CM | POA: Diagnosis not present

## 2018-11-14 DIAGNOSIS — Z5181 Encounter for therapeutic drug level monitoring: Secondary | ICD-10-CM

## 2018-11-14 LAB — POCT INR: INR: 5.4 — AB (ref 2.0–3.0)

## 2018-11-14 NOTE — Patient Instructions (Signed)
Description   Skip today and tomorrow's dose, then continue on same dosage 1 tablet daily.  Recheck INR in 2 weeks in the [place] office. Coumadin Clinic # [phone].

## 2018-11-21 ENCOUNTER — Telehealth: Payer: Self-pay

## 2018-11-21 NOTE — Telephone Encounter (Signed)
lmom for prescreen  

## 2018-11-22 ENCOUNTER — Other Ambulatory Visit: Payer: Self-pay | Admitting: Nurse Practitioner

## 2018-11-22 MED ORDER — FUROSEMIDE 40 MG PO TABS: ORAL_TABLET | ORAL | 3 refills | Status: DC

## 2018-11-22 NOTE — Telephone Encounter (Signed)
New message     *STAT* If patient is at the pharmacy, call can be transferred to refill team.   1. Which medications need to be refilled? (please list name of each medication and dose if known) furosemide (LASIX) 40 MG tablet  2. Which pharmacy/location (including street and city if local pharmacy) is medication to be sent to?[id number] [place], [place] - [address]  3. Do they need a 30 day or 90 day supply? [place]

## 2018-11-22 NOTE — Telephone Encounter (Signed)
Pt's medication was sent to pt's pharmacy as requested. Confirmation received.  °

## 2018-11-28 ENCOUNTER — Ambulatory Visit (INDEPENDENT_AMBULATORY_CARE_PROVIDER_SITE_OTHER): Payer: Medicare Other | Admitting: *Deleted

## 2018-11-28 ENCOUNTER — Other Ambulatory Visit: Payer: Self-pay

## 2018-11-28 DIAGNOSIS — I350 Nonrheumatic aortic (valve) stenosis: Secondary | ICD-10-CM

## 2018-11-28 DIAGNOSIS — Z5181 Encounter for therapeutic drug level monitoring: Secondary | ICD-10-CM

## 2018-11-28 LAB — POCT INR: INR: 4 — AB (ref 2.0–3.0)

## 2018-11-28 NOTE — Patient Instructions (Signed)
Description   Skip today's dose, then start taking  1 tablet daily except 1/2 tablet on Wednesdays.  Recheck INR in 2 weeks in the [place] office. Coumadin Clinic # [phone].

## 2018-12-05 ENCOUNTER — Telehealth: Payer: Self-pay

## 2018-12-05 NOTE — Telephone Encounter (Signed)

## 2018-12-12 ENCOUNTER — Ambulatory Visit (INDEPENDENT_AMBULATORY_CARE_PROVIDER_SITE_OTHER): Payer: Medicare Other | Admitting: *Deleted

## 2018-12-12 ENCOUNTER — Other Ambulatory Visit: Payer: Self-pay

## 2018-12-12 DIAGNOSIS — I350 Nonrheumatic aortic (valve) stenosis: Secondary | ICD-10-CM

## 2018-12-12 DIAGNOSIS — Z5181 Encounter for therapeutic drug level monitoring: Secondary | ICD-10-CM | POA: Diagnosis not present

## 2018-12-12 LAB — POCT INR: INR: 4 — AB (ref 2.0–3.0)

## 2018-12-12 NOTE — Patient Instructions (Signed)
Description   Skip today's dose, then start taking  1 tablet daily except 1/2 tablet on Wednesdays and Saturdays.  Recheck INR in 2 weeks in the [place] office. Coumadin Clinic # [phone].

## 2018-12-26 ENCOUNTER — Ambulatory Visit (INDEPENDENT_AMBULATORY_CARE_PROVIDER_SITE_OTHER): Payer: Medicare Other | Admitting: *Deleted

## 2018-12-26 DIAGNOSIS — I350 Nonrheumatic aortic (valve) stenosis: Secondary | ICD-10-CM

## 2018-12-26 DIAGNOSIS — Z5181 Encounter for therapeutic drug level monitoring: Secondary | ICD-10-CM | POA: Diagnosis not present

## 2018-12-26 LAB — POCT INR: INR: 4.7 — AB (ref 2.0–3.0)

## 2018-12-26 NOTE — Patient Instructions (Signed)
Description   Skip today's dose, then start taking  1 tablet daily except 1/2 tablet on Mondays,  Wednesdays and Fridays.  Recheck INR in 2 weeks in the [place] office. Coumadin Clinic # [phone].

## 2019-01-03 DIAGNOSIS — M25511 Pain in right shoulder: Secondary | ICD-10-CM | POA: Diagnosis not present

## 2019-01-03 DIAGNOSIS — G8929 Other chronic pain: Secondary | ICD-10-CM | POA: Diagnosis not present

## 2019-01-09 ENCOUNTER — Ambulatory Visit (INDEPENDENT_AMBULATORY_CARE_PROVIDER_SITE_OTHER): Payer: Medicare Other | Admitting: *Deleted

## 2019-01-09 DIAGNOSIS — I350 Nonrheumatic aortic (valve) stenosis: Secondary | ICD-10-CM | POA: Diagnosis not present

## 2019-01-09 DIAGNOSIS — Z5181 Encounter for therapeutic drug level monitoring: Secondary | ICD-10-CM | POA: Diagnosis not present

## 2019-01-09 DIAGNOSIS — M25511 Pain in right shoulder: Secondary | ICD-10-CM | POA: Diagnosis not present

## 2019-01-09 LAB — POCT INR: INR: 4 — AB (ref 2.0–3.0)

## 2019-01-09 NOTE — Patient Instructions (Signed)
Description   Skip today's dose, then start taking 1/2 pill everyday except 1 pill on Tuesdays,Thursdays and Saturdays.  Recheck INR in 2 weeks in the [place] office. Coumadin Clinic # [phone].

## 2019-01-15 ENCOUNTER — Telehealth: Payer: Self-pay | Admitting: *Deleted

## 2019-01-15 DIAGNOSIS — M25511 Pain in right shoulder: Secondary | ICD-10-CM | POA: Diagnosis not present

## 2019-01-15 NOTE — Telephone Encounter (Signed)
Patient with diagnosis of mechanical AVR and MVR on warfarin for anticoagulation.    Procedure: ARTHROSCOPIC CONVERTED OPEN RIGHT SHOULDER ROTATOR CUFF REPAIR   Date of procedure: TBD  Patient has a history of hemoperitoneum and mesenteric hematomas post laparoscopic gastic sleeve after bridge with lovenox. Hematoma ended up being evacuated and patient remained on heparin drip until INR therapeutic  Patient is high risk off anticoagulation due to mechanical MV and AV. Policy would dictate that she needs a lovenox bridge while off anticoagulation, however she developed a hematoma the last time she was bridged with lovenox. I will route to [name] for his input
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
## 2019-01-15 NOTE — Telephone Encounter (Signed)
   [facility] Pre-operative Risk Assessment    Request for surgical clearance:  1. What type of surgery is being performed? ARTHROSCOPIC CONVERTED OPEN RIGHT SHOULDER ROTATOR CUFF REPAIR    2. When is this surgery scheduled? TBD    3. What type of clearance is required (medical clearance vs. Pharmacy clearance to hold med vs. Both)? BOTH  4. Are there any medications that need to be held prior to surgery and how long? COUMADIN   5. Practice name and name of physician performing surgery? [facility]; Dartha Lodge, PA   6. What is your office phone number [phone]    7.   What is your office fax number [phone]  8.   Anesthesia type (None, local, MAC, general) ? GENERAL   Julaine Hua 01/15/2019, 4:35 PM  _________________________________________________________________   (provider comments below)

## 2019-01-16 ENCOUNTER — Encounter: Payer: Self-pay | Admitting: Nurse Practitioner

## 2019-01-16 ENCOUNTER — Ambulatory Visit (INDEPENDENT_AMBULATORY_CARE_PROVIDER_SITE_OTHER): Payer: Medicare Other | Admitting: Nurse Practitioner

## 2019-01-16 ENCOUNTER — Other Ambulatory Visit: Payer: Self-pay

## 2019-01-16 VITALS — BP 124/76 | HR 62 | Ht 65.0 in | Wt 292.0 lb

## 2019-01-16 DIAGNOSIS — Z952 Presence of prosthetic heart valve: Secondary | ICD-10-CM | POA: Diagnosis not present

## 2019-01-16 DIAGNOSIS — I10 Essential (primary) hypertension: Secondary | ICD-10-CM | POA: Diagnosis not present

## 2019-01-16 DIAGNOSIS — I5032 Chronic diastolic (congestive) heart failure: Secondary | ICD-10-CM

## 2019-01-16 DIAGNOSIS — Z7901 Long term (current) use of anticoagulants: Secondary | ICD-10-CM | POA: Diagnosis not present

## 2019-01-16 DIAGNOSIS — Z79899 Other long term (current) drug therapy: Secondary | ICD-10-CM | POA: Diagnosis not present

## 2019-01-16 DIAGNOSIS — Z7189 Other specified counseling: Secondary | ICD-10-CM | POA: Diagnosis not present

## 2019-01-16 LAB — BASIC METABOLIC PANEL
BUN/Creatinine Ratio: 12 (ref 9–23)
BUN: 14 mg/dL (ref 6–24)
CO2: 26 mmol/L (ref 20–29)
Calcium: 10.1 mg/dL (ref 8.7–10.2)
Chloride: 97 mmol/L (ref 96–106)
Creatinine, Ser: 1.16 mg/dL — ABNORMAL HIGH (ref 0.57–1.00)
GFR calc Af Amer: 60 mL/min/{1.73_m2} (ref >59)
GFR calc non Af Amer: 52 mL/min/{1.73_m2} — ABNORMAL LOW (ref >59)
Glucose: 82 mg/dL (ref 65–99)
Potassium: 4.6 mmol/L (ref 3.5–5.2)
Sodium: 141 mmol/L (ref 134–144)

## 2019-01-16 LAB — LIPID PANEL
Chol/HDL Ratio: 4.1 ratio (ref 0.0–4.4)
Cholesterol, Total: 200 mg/dL — ABNORMAL HIGH (ref 100–199)
HDL: 49 mg/dL (ref >39)
LDL Calculated: 111 mg/dL — ABNORMAL HIGH (ref 0–99)
Triglycerides: 201 mg/dL — ABNORMAL HIGH (ref 0–149)
VLDL Cholesterol Cal: 40 mg/dL (ref 5–40)

## 2019-01-16 LAB — CBC
Hematocrit: 44.5 % (ref 34.0–46.6)
Hemoglobin: 15 g/dL (ref 11.1–15.9)
MCH: 31.8 pg (ref 26.6–33.0)
MCHC: 33.7 g/dL (ref 31.5–35.7)
MCV: 94 fL (ref 79–97)
Platelets: 241 10*3/uL (ref 150–450)
RBC: 4.72 x10E6/uL (ref 3.77–5.28)
RDW: 12.4 % (ref 11.7–15.4)
WBC: 6.6 10*3/uL (ref 3.4–10.8)

## 2019-01-16 LAB — HEPATIC FUNCTION PANEL
ALT: 16 IU/L (ref 0–32)
AST: 28 IU/L (ref 0–40)
Albumin: 4.9 g/dL (ref 3.8–4.9)
Alkaline Phosphatase: 82 IU/L (ref 39–117)
Bilirubin Total: 0.6 mg/dL (ref 0.0–1.2)
Bilirubin, Direct: 0.16 mg/dL (ref 0.00–0.40)
Total Protein: 7.7 g/dL (ref 6.0–8.5)

## 2019-01-16 NOTE — Telephone Encounter (Signed)
Patient seen today.   Routing my note to [name] - I would favor bridging again due to having 2 undersized mechanical valves.   [name], RN, [place] [address] [place], [place]  [address] [phone]

## 2019-01-16 NOTE — Patient Instructions (Addendum)
After Visit Summary:  We will be checking the following labs today - BMET, CBC, HPF, Lipids   Medication Instructions:    Continue with your current medicines.    If you need a refill on your cardiac medications before your next appointment, please call your pharmacy.     Testing/Procedures To Be Arranged:  Will go ahead and move up the echo appointment  Follow-Up:   See me as planned.   We will need to know your surgery date - let the coumadin clinic know    At [facility], you and your health needs are our priority.  As part of our continuing mission to provide you with exceptional heart care, we have created designated Provider Care Teams.  These Care Teams include your primary Cardiologist (physician) and Advanced Practice Providers (APPs -  Physician Assistants and Nurse Practitioners) who all work together to provide you with the care you need, when you need it.  Special Instructions:  . Stay safe, stay home, wash your hands for at least 20 seconds and wear a mask when out in public.  . It was good to talk with you today.    Call the [place] office at [phone] if you have any questions, problems or concerns.

## 2019-01-19 ENCOUNTER — Other Ambulatory Visit: Payer: Self-pay

## 2019-01-19 ENCOUNTER — Ambulatory Visit (HOSPITAL_COMMUNITY): Payer: Medicare Other | Attending: Cardiovascular Disease

## 2019-01-19 DIAGNOSIS — I5032 Chronic diastolic (congestive) heart failure: Secondary | ICD-10-CM | POA: Diagnosis not present

## 2019-01-19 DIAGNOSIS — Z952 Presence of prosthetic heart valve: Secondary | ICD-10-CM | POA: Insufficient documentation

## 2019-01-22 ENCOUNTER — Telehealth: Payer: Self-pay | Admitting: Pharmacist Clinician (PhC)/ Clinical Pharmacy Specialist

## 2019-01-22 NOTE — Telephone Encounter (Signed)
Opened in error

## 2019-01-23 ENCOUNTER — Ambulatory Visit (INDEPENDENT_AMBULATORY_CARE_PROVIDER_SITE_OTHER): Payer: Medicare Other | Admitting: *Deleted

## 2019-01-23 ENCOUNTER — Other Ambulatory Visit: Payer: Self-pay

## 2019-01-23 DIAGNOSIS — Z5181 Encounter for therapeutic drug level monitoring: Secondary | ICD-10-CM | POA: Diagnosis not present

## 2019-01-23 DIAGNOSIS — I350 Nonrheumatic aortic (valve) stenosis: Secondary | ICD-10-CM

## 2019-01-23 LAB — POCT INR: INR: 2.7 (ref 2.0–3.0)

## 2019-01-23 NOTE — Patient Instructions (Signed)
Description   Continue taking 1/2 pill everyday except 1 pill on Tuesdays,Thursdays and Saturdays.  Recheck INR in 3 weeks in the [place] office. Coumadin Clinic # [phone].

## 2019-01-24 ENCOUNTER — Other Ambulatory Visit: Payer: Self-pay | Admitting: *Deleted

## 2019-01-24 DIAGNOSIS — I350 Nonrheumatic aortic (valve) stenosis: Secondary | ICD-10-CM

## 2019-01-26 DIAGNOSIS — Z01818 Encounter for other preprocedural examination: Secondary | ICD-10-CM | POA: Diagnosis not present

## 2019-01-31 ENCOUNTER — Encounter: Payer: Self-pay | Admitting: Critical Care Medicine

## 2019-01-31 ENCOUNTER — Ambulatory Visit (INDEPENDENT_AMBULATORY_CARE_PROVIDER_SITE_OTHER): Payer: Medicare Other | Admitting: Critical Care Medicine

## 2019-01-31 ENCOUNTER — Other Ambulatory Visit: Payer: Self-pay

## 2019-01-31 DIAGNOSIS — E662 Morbid (severe) obesity with alveolar hypoventilation: Secondary | ICD-10-CM

## 2019-01-31 DIAGNOSIS — J9611 Chronic respiratory failure with hypoxia: Secondary | ICD-10-CM | POA: Diagnosis not present
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
## 2019-01-31 NOTE — Patient Instructions (Addendum)
Thank you for visiting Dr. Carlis Abbott at [facility]. We recommend the following:  Good luck with surgery!   Return in about 6 months (around 07/31/2019).    Please do your part to reduce the spread of COVID-19.

## 2019-02-03 ENCOUNTER — Other Ambulatory Visit: Payer: Self-pay | Admitting: Nurse Practitioner

## 2019-02-06 DIAGNOSIS — H25813 Combined forms of age-related cataract, bilateral: Secondary | ICD-10-CM | POA: Diagnosis not present

## 2019-02-06 NOTE — Telephone Encounter (Signed)
Last OV in August 2020

## 2019-02-07 ENCOUNTER — Other Ambulatory Visit (HOSPITAL_COMMUNITY): Payer: Medicare Other

## 2019-02-12 NOTE — Telephone Encounter (Signed)
Pt has appt tomorrow with the coumadin clinic to discuss upcoming surgery and bridging.

## 2019-02-13 ENCOUNTER — Other Ambulatory Visit: Payer: Self-pay

## 2019-02-13 ENCOUNTER — Ambulatory Visit (INDEPENDENT_AMBULATORY_CARE_PROVIDER_SITE_OTHER): Payer: Medicare Other | Admitting: Pharmacist Clinician (PhC)/ Clinical Pharmacy Specialist

## 2019-02-13 ENCOUNTER — Ambulatory Visit: Payer: Medicare Other | Admitting: Nurse Practitioner

## 2019-02-13 DIAGNOSIS — I350 Nonrheumatic aortic (valve) stenosis: Secondary | ICD-10-CM | POA: Diagnosis not present

## 2019-02-13 DIAGNOSIS — Z5181 Encounter for therapeutic drug level monitoring: Secondary | ICD-10-CM

## 2019-02-13 LAB — POCT INR: INR: 3.6 — AB (ref 2.0–3.0)

## 2019-02-13 MED ORDER — ALBUTEROL SULFATE HFA 108 (90 BASE) MCG/ACT IN AERS: 2.0000 | INHALATION_SPRAY | Freq: Four times a day (QID) | RESPIRATORY_TRACT | 5 refills | Status: DC | PRN

## 2019-02-13 MED ORDER — ENOXAPARIN SODIUM 120 MG/0.8ML ~~LOC~~ SOLN: 120.0000 mg | Freq: Two times a day (BID) | SUBCUTANEOUS | 0 refills | Status: DC

## 2019-02-13 NOTE — Patient Instructions (Addendum)
Sept 26: Last dose of Coumadin.  Sept 27: No Coumadin or Lovenox.  Sept 28: Inject Lovenox 120 mg in the fatty abdominal tissue at least 2 inches from the belly button twice a day about 12 hours apart, 8am and 8pm rotate sites. No Coumadin.  (I usually suggest doing left side of abdomen in mornings and right in the evenings)  Sept 29: Inject Lovenox in the fatty tissue every 12 hours, 8am and 8pm. No Coumadin.  Sept 30: Inject Lovenox in the fatty tissue every 12 hours, 8am and 8pm. No Coumadin.  Oct 1: Inject Lovenox in the fatty tissue in the morning at 8 am (No PM dose). No Coumadin.  Oct 2 : Procedure Day - No Lovenox - Resume Coumadin in the evening or as directed by doctor (7.5 mg)   Oct 3: Resume Lovenox inject in the fatty tissue every 12 hours and take Coumadin 7.5 mg.  Oct 4: Inject Lovenox in the fatty tissue every 12 hours and take Coumadin 7.5 mg.  Oct 5: Inject Lovenox in the fatty tissue every 12 hours and take Coumadin 3.75 mg.  Oct 6: Inject Lovenox in the fatty tissue every 12 hours and take Coumadin 7.5 mg.  Oct 7: Inject Lovenox in the fatty tissue every 12 hours and take Coumadin 3.75 mg.  Oct 8: Coumadin appt to check INR. - 11:15 am at [facility] office - [address]

## 2019-02-26 DIAGNOSIS — M25511 Pain in right shoulder: Secondary | ICD-10-CM | POA: Diagnosis not present

## 2019-03-02 DIAGNOSIS — E785 Hyperlipidemia, unspecified: Secondary | ICD-10-CM | POA: Diagnosis not present

## 2019-03-02 DIAGNOSIS — M7551 Bursitis of right shoulder: Secondary | ICD-10-CM | POA: Diagnosis not present

## 2019-03-02 DIAGNOSIS — D751 Secondary polycythemia: Secondary | ICD-10-CM | POA: Diagnosis not present

## 2019-03-02 DIAGNOSIS — G4730 Sleep apnea, unspecified: Secondary | ICD-10-CM | POA: Diagnosis not present

## 2019-03-02 DIAGNOSIS — I890 Lymphedema, not elsewhere classified: Secondary | ICD-10-CM | POA: Diagnosis not present

## 2019-03-02 DIAGNOSIS — E559 Vitamin D deficiency, unspecified: Secondary | ICD-10-CM | POA: Diagnosis not present

## 2019-03-02 DIAGNOSIS — E781 Pure hyperglyceridemia: Secondary | ICD-10-CM | POA: Diagnosis not present

## 2019-03-02 DIAGNOSIS — M25511 Pain in right shoulder: Secondary | ICD-10-CM | POA: Diagnosis not present

## 2019-03-02 DIAGNOSIS — Z952 Presence of prosthetic heart valve: Secondary | ICD-10-CM | POA: Diagnosis not present

## 2019-03-02 DIAGNOSIS — Z79899 Other long term (current) drug therapy: Secondary | ICD-10-CM | POA: Diagnosis not present

## 2019-03-02 DIAGNOSIS — Z79891 Long term (current) use of opiate analgesic: Secondary | ICD-10-CM | POA: Diagnosis not present

## 2019-03-02 DIAGNOSIS — G8918 Other acute postprocedural pain: Secondary | ICD-10-CM | POA: Diagnosis not present

## 2019-03-02 DIAGNOSIS — M75121 Complete rotator cuff tear or rupture of right shoulder, not specified as traumatic: Secondary | ICD-10-CM | POA: Diagnosis not present

## 2019-03-02 DIAGNOSIS — I130 Hypertensive heart and chronic kidney disease with heart failure and stage 1 through stage 4 chronic kidney disease, or unspecified chronic kidney disease: Secondary | ICD-10-CM | POA: Diagnosis not present

## 2019-03-02 DIAGNOSIS — M75101 Unspecified rotator cuff tear or rupture of right shoulder, not specified as traumatic: Secondary | ICD-10-CM | POA: Diagnosis not present

## 2019-03-02 DIAGNOSIS — Z6841 Body Mass Index (BMI) 40.0 and over, adult: Secondary | ICD-10-CM | POA: Diagnosis not present

## 2019-03-02 DIAGNOSIS — J449 Chronic obstructive pulmonary disease, unspecified: Secondary | ICD-10-CM | POA: Diagnosis not present

## 2019-03-02 DIAGNOSIS — I10 Essential (primary) hypertension: Secondary | ICD-10-CM | POA: Diagnosis not present

## 2019-03-02 DIAGNOSIS — K219 Gastro-esophageal reflux disease without esophagitis: Secondary | ICD-10-CM | POA: Diagnosis not present

## 2019-03-02 DIAGNOSIS — N183 Chronic kidney disease, stage 3 unspecified: Secondary | ICD-10-CM | POA: Diagnosis not present

## 2019-03-02 DIAGNOSIS — S46811A Strain of other muscles, fascia and tendons at shoulder and upper arm level, right arm, initial encounter: Secondary | ICD-10-CM | POA: Diagnosis not present

## 2019-03-02 DIAGNOSIS — K0889 Other specified disorders of teeth and supporting structures: Secondary | ICD-10-CM | POA: Diagnosis not present

## 2019-03-02 DIAGNOSIS — Z9981 Dependence on supplemental oxygen: Secondary | ICD-10-CM | POA: Diagnosis not present

## 2019-03-02 DIAGNOSIS — E669 Obesity, unspecified: Secondary | ICD-10-CM | POA: Diagnosis not present

## 2019-03-02 DIAGNOSIS — J849 Interstitial pulmonary disease, unspecified: Secondary | ICD-10-CM | POA: Diagnosis not present

## 2019-03-02 DIAGNOSIS — Z7901 Long term (current) use of anticoagulants: Secondary | ICD-10-CM | POA: Diagnosis not present

## 2019-03-02 DIAGNOSIS — M7521 Bicipital tendinitis, right shoulder: Secondary | ICD-10-CM | POA: Diagnosis not present

## 2019-03-02 DIAGNOSIS — Z87891 Personal history of nicotine dependence: Secondary | ICD-10-CM | POA: Diagnosis not present

## 2019-03-02 DIAGNOSIS — I5032 Chronic diastolic (congestive) heart failure: Secondary | ICD-10-CM | POA: Diagnosis not present

## 2019-03-03 DIAGNOSIS — E785 Hyperlipidemia, unspecified: Secondary | ICD-10-CM

## 2019-03-03 DIAGNOSIS — M7551 Bursitis of right shoulder: Secondary | ICD-10-CM | POA: Diagnosis not present

## 2019-03-03 DIAGNOSIS — M75101 Unspecified rotator cuff tear or rupture of right shoulder, not specified as traumatic: Secondary | ICD-10-CM | POA: Diagnosis not present

## 2019-03-03 DIAGNOSIS — I10 Essential (primary) hypertension: Secondary | ICD-10-CM

## 2019-03-03 DIAGNOSIS — R918 Other nonspecific abnormal finding of lung field: Secondary | ICD-10-CM | POA: Diagnosis not present

## 2019-03-03 DIAGNOSIS — I5032 Chronic diastolic (congestive) heart failure: Secondary | ICD-10-CM | POA: Diagnosis not present

## 2019-03-03 DIAGNOSIS — M25511 Pain in right shoulder: Secondary | ICD-10-CM | POA: Diagnosis not present

## 2019-03-03 DIAGNOSIS — N183 Chronic kidney disease, stage 3 unspecified: Secondary | ICD-10-CM | POA: Diagnosis not present

## 2019-03-03 DIAGNOSIS — I130 Hypertensive heart and chronic kidney disease with heart failure and stage 1 through stage 4 chronic kidney disease, or unspecified chronic kidney disease: Secondary | ICD-10-CM | POA: Diagnosis not present

## 2019-03-03 DIAGNOSIS — J449 Chronic obstructive pulmonary disease, unspecified: Secondary | ICD-10-CM

## 2019-03-05 DIAGNOSIS — T8189XA Other complications of procedures, not elsewhere classified, initial encounter: Secondary | ICD-10-CM | POA: Diagnosis not present

## 2019-03-05 DIAGNOSIS — S41011A Laceration without foreign body of right shoulder, initial encounter: Secondary | ICD-10-CM | POA: Diagnosis not present

## 2019-03-05 DIAGNOSIS — L7622 Postprocedural hemorrhage and hematoma of skin and subcutaneous tissue following other procedure: Secondary | ICD-10-CM | POA: Diagnosis not present

## 2019-03-06 ENCOUNTER — Telehealth: Payer: Self-pay | Admitting: Nurse Practitioner

## 2019-03-06 NOTE — Telephone Encounter (Signed)
Noted.   [name] 

## 2019-03-06 NOTE — Telephone Encounter (Signed)
S/w daughter in law pt was getting no relief with sleeping and pain. Stated wanted to give pt ibuprofen Cecille Rubin stated can give two doses today at (200 mg ) and than stop.  Pt has to go back to tylenol.

## 2019-03-06 NOTE — Telephone Encounter (Signed)
New Message  Patient is in pain due to having rotator cuff surgery. She is cycling between oxycodone and tylenol, but is not getting any pain relief. Patient's daughter wants to know is there any medication she can give to the patient for her to relax since the medication is not helping the pain. Please call patient's daughter back and advise.

## 2019-03-09 ENCOUNTER — Other Ambulatory Visit: Payer: Self-pay

## 2019-03-09 ENCOUNTER — Ambulatory Visit (INDEPENDENT_AMBULATORY_CARE_PROVIDER_SITE_OTHER): Payer: Medicare Other | Admitting: Pharmacist

## 2019-03-09 DIAGNOSIS — Z5181 Encounter for therapeutic drug level monitoring: Secondary | ICD-10-CM

## 2019-03-09 DIAGNOSIS — I350 Nonrheumatic aortic (valve) stenosis: Secondary | ICD-10-CM

## 2019-03-09 LAB — POCT INR: INR: 2.3 (ref 2.0–3.0)

## 2019-03-09 MED ORDER — ENOXAPARIN SODIUM 120 MG/0.8ML ~~LOC~~ SOLN: 120.0000 mg | Freq: Two times a day (BID) | SUBCUTANEOUS | 0 refills | Status: DC

## 2019-03-09 NOTE — Patient Instructions (Signed)
Description   Take 1 tablet today and 1.5 tablets on Saturday. Then continue taking 1/2 pill everyday except 1 pill on Tuesdays,Thursdays and Saturdays. Continue lovenox for 1 dose tonight and 2 doses on Saturday. Recheck INR on Tuesday in [place] office. Coumadin Clinic # [phone].

## 2019-03-13 ENCOUNTER — Other Ambulatory Visit: Payer: Self-pay

## 2019-03-13 ENCOUNTER — Ambulatory Visit (INDEPENDENT_AMBULATORY_CARE_PROVIDER_SITE_OTHER): Payer: Medicare Other | Admitting: *Deleted

## 2019-03-13 DIAGNOSIS — Z5181 Encounter for therapeutic drug level monitoring: Secondary | ICD-10-CM | POA: Diagnosis not present

## 2019-03-13 DIAGNOSIS — I350 Nonrheumatic aortic (valve) stenosis: Secondary | ICD-10-CM | POA: Diagnosis not present

## 2019-03-13 LAB — POCT INR: INR: 3 (ref 2.0–3.0)

## 2019-03-13 NOTE — Patient Instructions (Signed)
Continue warfarin 1/2 pill every day except 1 pill on Tuesdays, Thursdays and Saturdays.  Recheck INR in 2 wks in [place] office. Coumadin Clinic # [phone].
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
## 2019-03-27 ENCOUNTER — Ambulatory Visit (INDEPENDENT_AMBULATORY_CARE_PROVIDER_SITE_OTHER): Payer: Medicare Other | Admitting: Nurse Practitioner

## 2019-03-27 ENCOUNTER — Other Ambulatory Visit: Payer: Self-pay

## 2019-03-27 ENCOUNTER — Encounter: Payer: Self-pay | Admitting: Nurse Practitioner

## 2019-03-27 ENCOUNTER — Ambulatory Visit (INDEPENDENT_AMBULATORY_CARE_PROVIDER_SITE_OTHER): Payer: Medicare Other

## 2019-03-27 VITALS — BP 118/66 | HR 63 | Ht 66.0 in | Wt 286.0 lb

## 2019-03-27 DIAGNOSIS — Z952 Presence of prosthetic heart valve: Secondary | ICD-10-CM | POA: Diagnosis not present

## 2019-03-27 DIAGNOSIS — Z7189 Other specified counseling: Secondary | ICD-10-CM

## 2019-03-27 DIAGNOSIS — Z7901 Long term (current) use of anticoagulants: Secondary | ICD-10-CM

## 2019-03-27 DIAGNOSIS — I5032 Chronic diastolic (congestive) heart failure: Secondary | ICD-10-CM

## 2019-03-27 DIAGNOSIS — Z23 Encounter for immunization: Secondary | ICD-10-CM

## 2019-03-27 DIAGNOSIS — I10 Essential (primary) hypertension: Secondary | ICD-10-CM | POA: Diagnosis not present

## 2019-03-27 DIAGNOSIS — N183 Chronic kidney disease, stage 3 unspecified: Secondary | ICD-10-CM | POA: Diagnosis not present

## 2019-03-27 DIAGNOSIS — I350 Nonrheumatic aortic (valve) stenosis: Secondary | ICD-10-CM

## 2019-03-27 DIAGNOSIS — Z5181 Encounter for therapeutic drug level monitoring: Secondary | ICD-10-CM | POA: Diagnosis not present

## 2019-03-27 LAB — POCT INR: INR: 3 (ref 2.0–3.0)

## 2019-03-27 NOTE — Patient Instructions (Addendum)
After Visit Summary:  We will be checking the following labs today - NONE   Medication Instructions:    Continue with your current medicines.    If you need a refill on your cardiac medications before your next appointment, please call your pharmacy.     Testing/Procedures To Be Arranged:  N/A  Follow-Up:  See [name] a few days after the echo in February   At [facility], you and your health needs are our priority.  As part of our continuing mission to provide you with exceptional heart care, we have created designated Provider Care Teams.  These Care Teams include your primary Cardiologist (physician) and Advanced Practice Providers (APPs -  Physician Assistants and Nurse Practitioners) who all work together to provide you with the care you need, when you need it.  Special Instructions:  . Stay safe, stay home, wash your hands for at least 20 seconds and wear a mask when out in public.  . It was good to talk with you today.  . Walking every day!   Call the [place] office at [phone] if you have any questions, problems or concerns.

## 2019-03-27 NOTE — Patient Instructions (Signed)
Description   Continue on same dosage 1/2 tablet every day except 1 tablet on Tuesdays, Thursdays and Saturdays. Recheck INR in 4 wks in [place] office. [facility] # [phone].

## 2019-04-03 DIAGNOSIS — I129 Hypertensive chronic kidney disease with stage 1 through stage 4 chronic kidney disease, or unspecified chronic kidney disease: Secondary | ICD-10-CM | POA: Diagnosis not present

## 2019-04-03 DIAGNOSIS — I38 Endocarditis, valve unspecified: Secondary | ICD-10-CM | POA: Diagnosis not present

## 2019-04-03 DIAGNOSIS — N2581 Secondary hyperparathyroidism of renal origin: Secondary | ICD-10-CM | POA: Diagnosis not present

## 2019-04-03 DIAGNOSIS — N1831 Chronic kidney disease, stage 3a: Secondary | ICD-10-CM | POA: Diagnosis not present

## 2019-04-03 DIAGNOSIS — D631 Anemia in chronic kidney disease: Secondary | ICD-10-CM | POA: Diagnosis not present

## 2019-04-03 DIAGNOSIS — Z6841 Body Mass Index (BMI) 40.0 and over, adult: Secondary | ICD-10-CM | POA: Diagnosis not present

## 2019-04-17 DIAGNOSIS — H25811 Combined forms of age-related cataract, right eye: Secondary | ICD-10-CM | POA: Diagnosis not present

## 2019-04-18 DIAGNOSIS — M25611 Stiffness of right shoulder, not elsewhere classified: Secondary | ICD-10-CM | POA: Diagnosis not present

## 2019-04-18 DIAGNOSIS — M6281 Muscle weakness (generalized): Secondary | ICD-10-CM | POA: Diagnosis not present

## 2019-04-18 DIAGNOSIS — M25511 Pain in right shoulder: Secondary | ICD-10-CM | POA: Diagnosis not present

## 2019-04-23 DIAGNOSIS — M25511 Pain in right shoulder: Secondary | ICD-10-CM | POA: Diagnosis not present

## 2019-04-23 DIAGNOSIS — M25611 Stiffness of right shoulder, not elsewhere classified: Secondary | ICD-10-CM | POA: Diagnosis not present

## 2019-04-23 DIAGNOSIS — M6281 Muscle weakness (generalized): Secondary | ICD-10-CM | POA: Diagnosis not present

## 2019-04-24 ENCOUNTER — Ambulatory Visit (INDEPENDENT_AMBULATORY_CARE_PROVIDER_SITE_OTHER): Payer: Medicare Other | Admitting: Pharmacist Clinician (PhC)/ Clinical Pharmacy Specialist

## 2019-04-24 ENCOUNTER — Other Ambulatory Visit: Payer: Self-pay

## 2019-04-24 DIAGNOSIS — I350 Nonrheumatic aortic (valve) stenosis: Secondary | ICD-10-CM

## 2019-04-24 DIAGNOSIS — Z5181 Encounter for therapeutic drug level monitoring: Secondary | ICD-10-CM

## 2019-04-24 LAB — POCT INR: INR: 3 (ref 2.0–3.0)

## 2019-04-25 DIAGNOSIS — M25511 Pain in right shoulder: Secondary | ICD-10-CM | POA: Diagnosis not present

## 2019-04-25 DIAGNOSIS — M6281 Muscle weakness (generalized): Secondary | ICD-10-CM | POA: Diagnosis not present

## 2019-04-25 DIAGNOSIS — M25611 Stiffness of right shoulder, not elsewhere classified: Secondary | ICD-10-CM | POA: Diagnosis not present

## 2019-05-02 ENCOUNTER — Other Ambulatory Visit: Payer: Self-pay | Admitting: Nurse Practitioner

## 2019-05-07 DIAGNOSIS — M6281 Muscle weakness (generalized): Secondary | ICD-10-CM | POA: Diagnosis not present

## 2019-05-07 DIAGNOSIS — M25611 Stiffness of right shoulder, not elsewhere classified: Secondary | ICD-10-CM | POA: Diagnosis not present

## 2019-05-07 DIAGNOSIS — M25511 Pain in right shoulder: Secondary | ICD-10-CM | POA: Diagnosis not present

## 2019-05-09 DIAGNOSIS — M6281 Muscle weakness (generalized): Secondary | ICD-10-CM | POA: Diagnosis not present

## 2019-05-09 DIAGNOSIS — M25511 Pain in right shoulder: Secondary | ICD-10-CM | POA: Diagnosis not present

## 2019-05-09 DIAGNOSIS — M25611 Stiffness of right shoulder, not elsewhere classified: Secondary | ICD-10-CM | POA: Diagnosis not present

## 2019-05-14 DIAGNOSIS — M25611 Stiffness of right shoulder, not elsewhere classified: Secondary | ICD-10-CM | POA: Diagnosis not present

## 2019-05-14 DIAGNOSIS — M6281 Muscle weakness (generalized): Secondary | ICD-10-CM | POA: Diagnosis not present

## 2019-05-14 DIAGNOSIS — M25511 Pain in right shoulder: Secondary | ICD-10-CM | POA: Diagnosis not present

## 2019-05-19 ENCOUNTER — Other Ambulatory Visit: Payer: Self-pay | Admitting: Nurse Practitioner

## 2019-06-04 DIAGNOSIS — H2511 Age-related nuclear cataract, right eye: Secondary | ICD-10-CM | POA: Diagnosis not present

## 2019-06-04 DIAGNOSIS — H25811 Combined forms of age-related cataract, right eye: Secondary | ICD-10-CM | POA: Diagnosis not present

## 2019-06-05 ENCOUNTER — Ambulatory Visit (INDEPENDENT_AMBULATORY_CARE_PROVIDER_SITE_OTHER): Payer: Medicare Other | Admitting: Pharmacist Clinician (PhC)/ Clinical Pharmacy Specialist

## 2019-06-05 ENCOUNTER — Other Ambulatory Visit: Payer: Self-pay

## 2019-06-05 DIAGNOSIS — Z5181 Encounter for therapeutic drug level monitoring: Secondary | ICD-10-CM | POA: Diagnosis not present

## 2019-06-05 DIAGNOSIS — I350 Nonrheumatic aortic (valve) stenosis: Secondary | ICD-10-CM

## 2019-06-05 LAB — POCT INR: INR: 1.7 — AB (ref 2.0–3.0)

## 2019-06-05 NOTE — Patient Instructions (Signed)
Take 1.5 tablets today Tuesday Jan 5 then 1 tablet Wednesday Jan 6.  Then continue on same dosage 1/2 tablet every day except 1 tablet on Tuesdays, Thursdays and Saturdays. Recheck INR in 2 wks. Coumadin Clinic # [phone].

## 2019-06-15 DIAGNOSIS — M25511 Pain in right shoulder: Secondary | ICD-10-CM | POA: Diagnosis not present

## 2019-06-15 DIAGNOSIS — M25611 Stiffness of right shoulder, not elsewhere classified: Secondary | ICD-10-CM | POA: Diagnosis not present

## 2019-06-15 DIAGNOSIS — M6281 Muscle weakness (generalized): Secondary | ICD-10-CM | POA: Diagnosis not present

## 2019-06-18 DIAGNOSIS — H2512 Age-related nuclear cataract, left eye: Secondary | ICD-10-CM | POA: Diagnosis not present

## 2019-06-18 DIAGNOSIS — H25812 Combined forms of age-related cataract, left eye: Secondary | ICD-10-CM | POA: Diagnosis not present

## 2019-06-19 ENCOUNTER — Ambulatory Visit (INDEPENDENT_AMBULATORY_CARE_PROVIDER_SITE_OTHER): Payer: Medicare Other | Admitting: *Deleted

## 2019-06-19 ENCOUNTER — Other Ambulatory Visit: Payer: Self-pay

## 2019-06-19 DIAGNOSIS — Z5181 Encounter for therapeutic drug level monitoring: Secondary | ICD-10-CM

## 2019-06-19 DIAGNOSIS — I350 Nonrheumatic aortic (valve) stenosis: Secondary | ICD-10-CM

## 2019-06-19 LAB — POCT INR: INR: 2.6 (ref 2.0–3.0)

## 2019-06-19 NOTE — Patient Instructions (Signed)
Continue warfarin 1/2 tablet every day except 1 tablet on Tuesdays, Thursdays and Saturdays. Recheck INR in 3 wks. Coumadin Clinic # [phone].

## 2019-07-05 DIAGNOSIS — M25511 Pain in right shoulder: Secondary | ICD-10-CM | POA: Diagnosis not present

## 2019-07-10 ENCOUNTER — Ambulatory Visit (INDEPENDENT_AMBULATORY_CARE_PROVIDER_SITE_OTHER): Payer: Medicare Other | Admitting: Pharmacist

## 2019-07-10 ENCOUNTER — Other Ambulatory Visit: Payer: Self-pay

## 2019-07-10 DIAGNOSIS — M25511 Pain in right shoulder: Secondary | ICD-10-CM | POA: Diagnosis not present

## 2019-07-10 DIAGNOSIS — Z5181 Encounter for therapeutic drug level monitoring: Secondary | ICD-10-CM

## 2019-07-10 DIAGNOSIS — M25611 Stiffness of right shoulder, not elsewhere classified: Secondary | ICD-10-CM | POA: Diagnosis not present

## 2019-07-10 DIAGNOSIS — I350 Nonrheumatic aortic (valve) stenosis: Secondary | ICD-10-CM | POA: Diagnosis not present

## 2019-07-10 DIAGNOSIS — M6281 Muscle weakness (generalized): Secondary | ICD-10-CM | POA: Diagnosis not present

## 2019-07-10 LAB — POCT INR: INR: 2.3 (ref 2.0–3.0)

## 2019-07-11 DIAGNOSIS — M25611 Stiffness of right shoulder, not elsewhere classified: Secondary | ICD-10-CM | POA: Diagnosis not present

## 2019-07-11 DIAGNOSIS — M6281 Muscle weakness (generalized): Secondary | ICD-10-CM | POA: Diagnosis not present

## 2019-07-11 DIAGNOSIS — M25511 Pain in right shoulder: Secondary | ICD-10-CM | POA: Diagnosis not present

## 2019-07-17 ENCOUNTER — Ambulatory Visit (HOSPITAL_COMMUNITY): Payer: Medicare Other | Attending: Cardiovascular Disease

## 2019-07-17 ENCOUNTER — Other Ambulatory Visit: Payer: Self-pay

## 2019-07-17 DIAGNOSIS — I350 Nonrheumatic aortic (valve) stenosis: Secondary | ICD-10-CM

## 2019-07-18 DIAGNOSIS — M25611 Stiffness of right shoulder, not elsewhere classified: Secondary | ICD-10-CM | POA: Diagnosis not present

## 2019-07-18 DIAGNOSIS — M25511 Pain in right shoulder: Secondary | ICD-10-CM | POA: Diagnosis not present

## 2019-07-18 DIAGNOSIS — M6281 Muscle weakness (generalized): Secondary | ICD-10-CM | POA: Diagnosis not present

## 2019-07-20 ENCOUNTER — Ambulatory Visit: Payer: Medicare Other | Admitting: Interventional Cardiology

## 2019-07-31 ENCOUNTER — Ambulatory Visit (INDEPENDENT_AMBULATORY_CARE_PROVIDER_SITE_OTHER): Payer: Medicare Other | Admitting: Pharmacist

## 2019-07-31 ENCOUNTER — Other Ambulatory Visit: Payer: Self-pay

## 2019-07-31 DIAGNOSIS — M25611 Stiffness of right shoulder, not elsewhere classified: Secondary | ICD-10-CM | POA: Diagnosis not present

## 2019-07-31 DIAGNOSIS — I350 Nonrheumatic aortic (valve) stenosis: Secondary | ICD-10-CM | POA: Diagnosis not present

## 2019-07-31 DIAGNOSIS — Z5181 Encounter for therapeutic drug level monitoring: Secondary | ICD-10-CM | POA: Diagnosis not present

## 2019-07-31 DIAGNOSIS — M6281 Muscle weakness (generalized): Secondary | ICD-10-CM | POA: Diagnosis not present

## 2019-07-31 DIAGNOSIS — M25511 Pain in right shoulder: Secondary | ICD-10-CM | POA: Diagnosis not present

## 2019-07-31 LAB — POCT INR: INR: 1.7 — AB (ref 2.0–3.0)

## 2019-07-31 NOTE — Patient Instructions (Signed)
Description   Take 1.5 tablets today and 1 tablet tomorrow, then start taking warfarin 1 tablet every day except 1/2 tablet on Mondays, Wednesdays, and Fridays. Recheck INR in 1 week. Coumadin Clinic # [phone].

## 2019-08-06 DIAGNOSIS — G8929 Other chronic pain: Secondary | ICD-10-CM | POA: Diagnosis not present

## 2019-08-06 DIAGNOSIS — S46811A Strain of other muscles, fascia and tendons at shoulder and upper arm level, right arm, initial encounter: Secondary | ICD-10-CM | POA: Diagnosis not present

## 2019-08-06 DIAGNOSIS — M25511 Pain in right shoulder: Secondary | ICD-10-CM | POA: Diagnosis not present

## 2019-08-07 ENCOUNTER — Other Ambulatory Visit: Payer: Self-pay

## 2019-08-07 ENCOUNTER — Ambulatory Visit (INDEPENDENT_AMBULATORY_CARE_PROVIDER_SITE_OTHER): Payer: Medicare Other | Admitting: Pharmacist

## 2019-08-07 DIAGNOSIS — I350 Nonrheumatic aortic (valve) stenosis: Secondary | ICD-10-CM | POA: Diagnosis not present

## 2019-08-07 DIAGNOSIS — Z5181 Encounter for therapeutic drug level monitoring: Secondary | ICD-10-CM | POA: Diagnosis not present

## 2019-08-07 LAB — POCT INR: INR: 1.8 — AB (ref 2.0–3.0)

## 2019-08-07 NOTE — Patient Instructions (Signed)
Description   Take 1.5 tablets today and tomorrow, then start taking warfarin 1 tablet every day except 1/2 tablet on Mondays and Fridays. Recheck INR in 1 week. Coumadin Clinic # [phone].

## 2019-08-08 ENCOUNTER — Encounter (HOSPITAL_COMMUNITY): Payer: Self-pay

## 2019-08-14 ENCOUNTER — Ambulatory Visit (INDEPENDENT_AMBULATORY_CARE_PROVIDER_SITE_OTHER): Payer: Medicare Other | Admitting: Cardiology

## 2019-08-14 ENCOUNTER — Other Ambulatory Visit: Payer: Self-pay

## 2019-08-14 DIAGNOSIS — E639 Nutritional deficiency, unspecified: Secondary | ICD-10-CM | POA: Diagnosis not present

## 2019-08-14 DIAGNOSIS — I350 Nonrheumatic aortic (valve) stenosis: Secondary | ICD-10-CM | POA: Diagnosis not present

## 2019-08-14 DIAGNOSIS — K912 Postsurgical malabsorption, not elsewhere classified: Secondary | ICD-10-CM | POA: Diagnosis not present

## 2019-08-14 DIAGNOSIS — R5383 Other fatigue: Secondary | ICD-10-CM | POA: Diagnosis not present

## 2019-08-14 DIAGNOSIS — Z5181 Encounter for therapeutic drug level monitoring: Secondary | ICD-10-CM

## 2019-08-14 DIAGNOSIS — Z9884 Bariatric surgery status: Secondary | ICD-10-CM | POA: Diagnosis not present

## 2019-08-14 DIAGNOSIS — Z1321 Encounter for screening for nutritional disorder: Secondary | ICD-10-CM | POA: Diagnosis not present

## 2019-08-14 LAB — POCT INR: INR: 1.8 — AB (ref 2.0–3.0)

## 2019-08-14 NOTE — Patient Instructions (Signed)
Take 1.5 tablets today and tomorrow, then start taking warfarin 1 tablet every day except 1/2 tablet on Mondays. Recheck INR in 1 week. Coumadin Clinic # [phone].

## 2019-08-22 DIAGNOSIS — Z23 Encounter for immunization: Secondary | ICD-10-CM | POA: Diagnosis not present
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
## 2019-08-24 ENCOUNTER — Ambulatory Visit (INDEPENDENT_AMBULATORY_CARE_PROVIDER_SITE_OTHER): Payer: Medicare Other | Admitting: *Deleted

## 2019-08-24 ENCOUNTER — Other Ambulatory Visit: Payer: Self-pay

## 2019-08-24 ENCOUNTER — Ambulatory Visit (INDEPENDENT_AMBULATORY_CARE_PROVIDER_SITE_OTHER): Payer: Medicare Other | Admitting: Interventional Cardiology

## 2019-08-24 ENCOUNTER — Encounter: Payer: Self-pay | Admitting: Interventional Cardiology

## 2019-08-24 VITALS — BP 132/72 | HR 60 | Ht 66.0 in | Wt 289.0 lb

## 2019-08-24 DIAGNOSIS — Z7189 Other specified counseling: Secondary | ICD-10-CM

## 2019-08-24 DIAGNOSIS — Z7901 Long term (current) use of anticoagulants: Secondary | ICD-10-CM | POA: Diagnosis not present

## 2019-08-24 DIAGNOSIS — N183 Chronic kidney disease, stage 3 unspecified: Secondary | ICD-10-CM

## 2019-08-24 DIAGNOSIS — I350 Nonrheumatic aortic (valve) stenosis: Secondary | ICD-10-CM

## 2019-08-24 DIAGNOSIS — I5032 Chronic diastolic (congestive) heart failure: Secondary | ICD-10-CM | POA: Diagnosis not present

## 2019-08-24 DIAGNOSIS — I10 Essential (primary) hypertension: Secondary | ICD-10-CM

## 2019-08-24 DIAGNOSIS — Z952 Presence of prosthetic heart valve: Secondary | ICD-10-CM

## 2019-08-24 DIAGNOSIS — Z5181 Encounter for therapeutic drug level monitoring: Secondary | ICD-10-CM

## 2019-08-24 LAB — POCT INR: INR: 2.9 (ref 2.0–3.0)

## 2019-08-24 NOTE — Patient Instructions (Signed)
Medication Instructions:  Your physician recommends that you continue on your current medications as directed. Please refer to the Current Medication list given to you today.  *If you need a refill on your cardiac medications before your next appointment, please call your pharmacy*   Lab Work: None If you have labs (blood work) drawn today and your tests are completely normal, you will receive your results only by: Marland Kitchen MyChart Message (if you have MyChart) OR . A paper copy in the mail If you have any lab test that is abnormal or we need to change your treatment, we will call you to review the results.   Testing/Procedures: None   Follow-Up: At [facility], you and your health needs are our priority.  As part of our continuing mission to provide you with exceptional heart care, we have created designated Provider Care Teams.  These Care Teams include your primary Cardiologist (physician) and Advanced Practice Providers (APPs -  Physician Assistants and Nurse Practitioners) who all work together to provide you with the care you need, when you need it.  We recommend signing up for the patient portal called "MyChart".  Sign up information is provided on this After Visit Summary.  MyChart is used to connect with patients for Virtual Visits (Telemedicine).  Patients are able to view lab/test results, encounter notes, upcoming appointments, etc.  Non-urgent messages can be sent to your provider as well.   To learn more about what you can do with MyChart, go to [email or web address].    Your next appointment:   9 month(s)  The format for your next appointment:   In Person  Provider:   You may see Sinclair Grooms, MD or one of the following Advanced Practice Providers on your designated Care Team:    [name], NP  Cecilie Kicks, NP  Kathyrn Drown, NP    Other Instructions

## 2019-08-24 NOTE — Patient Instructions (Signed)
Description   Continue taking warfarin 1 tablet every day except 1/2 tablet on Mondays. Recheck INR in 2 week. Cou[facility]in Clinic # [phone].

## 2019-08-30 DIAGNOSIS — Z01411 Encounter for gynecological examination (general) (routine) with abnormal findings: Secondary | ICD-10-CM | POA: Diagnosis not present

## 2019-08-30 DIAGNOSIS — N183 Chronic kidney disease, stage 3 unspecified: Secondary | ICD-10-CM | POA: Diagnosis not present

## 2019-08-30 DIAGNOSIS — J449 Chronic obstructive pulmonary disease, unspecified: Secondary | ICD-10-CM | POA: Diagnosis not present

## 2019-08-30 DIAGNOSIS — R7301 Impaired fasting glucose: Secondary | ICD-10-CM | POA: Diagnosis not present

## 2019-08-30 DIAGNOSIS — Z1211 Encounter for screening for malignant neoplasm of colon: Secondary | ICD-10-CM | POA: Diagnosis not present

## 2019-08-30 DIAGNOSIS — E785 Hyperlipidemia, unspecified: Secondary | ICD-10-CM | POA: Diagnosis not present

## 2019-08-30 DIAGNOSIS — Z1212 Encounter for screening for malignant neoplasm of rectum: Secondary | ICD-10-CM | POA: Diagnosis not present

## 2019-08-30 DIAGNOSIS — I10 Essential (primary) hypertension: Secondary | ICD-10-CM | POA: Diagnosis not present

## 2019-08-30 DIAGNOSIS — Z79899 Other long term (current) drug therapy: Secondary | ICD-10-CM | POA: Diagnosis not present

## 2019-08-30 DIAGNOSIS — I4821 Permanent atrial fibrillation: Secondary | ICD-10-CM | POA: Diagnosis not present

## 2019-08-30 DIAGNOSIS — I5032 Chronic diastolic (congestive) heart failure: Secondary | ICD-10-CM | POA: Diagnosis not present

## 2019-09-11 ENCOUNTER — Ambulatory Visit (INDEPENDENT_AMBULATORY_CARE_PROVIDER_SITE_OTHER): Payer: Medicare Other | Admitting: Pharmacist

## 2019-09-11 ENCOUNTER — Other Ambulatory Visit: Payer: Self-pay

## 2019-09-11 DIAGNOSIS — I480 Paroxysmal atrial fibrillation: Secondary | ICD-10-CM

## 2019-09-11 DIAGNOSIS — Z5181 Encounter for therapeutic drug level monitoring: Secondary | ICD-10-CM

## 2019-09-11 DIAGNOSIS — I350 Nonrheumatic aortic (valve) stenosis: Secondary | ICD-10-CM | POA: Diagnosis not present

## 2019-09-11 LAB — POCT INR: INR: 3 (ref 2.0–3.0)

## 2019-09-11 NOTE — Patient Instructions (Signed)
Continue taking warfarin 1 tablet every day except 1/2 tablet on Mondays. Recheck INR in 3 week. Coumadin Clinic # [phone].

## 2019-09-12 DIAGNOSIS — Z23 Encounter for immunization: Secondary | ICD-10-CM | POA: Diagnosis not present

## 2019-09-17 ENCOUNTER — Ambulatory Visit: Payer: Medicare Other | Admitting: Skilled Nursing Facility1

## 2019-09-20 ENCOUNTER — Ambulatory Visit: Payer: Medicare Other | Admitting: Skilled Nursing Facility1

## 2019-10-01 ENCOUNTER — Other Ambulatory Visit: Payer: Self-pay | Admitting: Nurse Practitioner

## 2019-10-01 DIAGNOSIS — Z1231 Encounter for screening mammogram for malignant neoplasm of breast: Secondary | ICD-10-CM

## 2019-10-02 ENCOUNTER — Ambulatory Visit (INDEPENDENT_AMBULATORY_CARE_PROVIDER_SITE_OTHER): Payer: Medicare Other | Admitting: *Deleted

## 2019-10-02 ENCOUNTER — Other Ambulatory Visit: Payer: Self-pay

## 2019-10-02 DIAGNOSIS — I350 Nonrheumatic aortic (valve) stenosis: Secondary | ICD-10-CM | POA: Diagnosis not present

## 2019-10-02 DIAGNOSIS — I480 Paroxysmal atrial fibrillation: Secondary | ICD-10-CM

## 2019-10-02 LAB — POCT INR: INR: 3.5 — AB (ref 2.0–3.0)

## 2019-10-02 NOTE — Patient Instructions (Signed)
Continue taking warfarin 1 tablet every day except 1/2 tablet on Mondays.  Increase greens/salads by 1 serving a week Recheck INR in 4 week. Coumadin Clinic # [phone].

## 2019-10-08 ENCOUNTER — Encounter: Payer: Medicare Other | Attending: Student | Admitting: Skilled Nursing Facility1

## 2019-10-08 ENCOUNTER — Other Ambulatory Visit: Payer: Self-pay

## 2019-10-08 DIAGNOSIS — Z713 Dietary counseling and surveillance: Secondary | ICD-10-CM | POA: Insufficient documentation

## 2019-10-08 DIAGNOSIS — E669 Obesity, unspecified: Secondary | ICD-10-CM

## 2019-10-08 NOTE — Progress Notes (Signed)
Bariatric Follow-Up Visit Medical Nutrition Therapy   Post-Operative Sleeve Gastrectomy Surgery  Primary Concerns Today: Bariatric Follow Up.    NUTRITION ASSESSMENT  Anthropometrics  Start weight at [facility]: 326.8 lbs (date: 04/28/2017) Today's weight: pt declined (states she has lost ~75 lbs since surgery on 01/24/2018)  Clinical Medical Hx: obesity, chronic diastolic heart failure, interstitial lung disease/asthma, CKD, HTN, on chronic oxygen, valvular heart disease s/p aortic and mitral valve replacement on chronic Coumadin  Pt states she tried to cut back on Protonix but experienced burning so went back on it. Pt states she was told to take a multi by her [place] PA but still has not started them. Pt states she drinks 152+ ounces of fluid per day: Dietitian clarified this and pt maintained she drinks that amount per day. Pt admits to food being her comfort. Pt states she is not a big meat eater and does not eat seafood. Pt states she knows what to do and how to eat but is not motivated to do so because she just wants it done now.    24-Hr Dietary Recall First Meal: 1 egg (cooked with pam) and 1 packet grits Snack: popcorn or fruit   Second Meal: salad-iceburg, black olives, peppers, cheese, french dressing sometimes with [place] pepperoni  Snack: granola bar And yogurt Third Meal: chicken and tomato (bought out) Snack: Museum/gallery conservator  Beverages: water + Crystal Light lemonade, plain water   Supplements: not taking bariatric MVI not taking calcium Estimated Daily Fluid Intake: 100 oz Estimated Daily Protein Intake: 60+ g  Physical Activity  Current average weekly physical activity: ADL's  Post-Op Goals Using straws: no Drinking while eating: no Chewing/swallowing difficulties: no Changes in vision: no Changes to mood/headaches: no Hair loss/changes to skin/nails: nails cracked Difficulty focusing/concentrating: no Sweating: no Dizziness/lightheadedness:  no Palpitations: no  Carbonated/caffeinated beverages: yes (Sprite occasionally)  N/V/D/C/Gas: no Abdominal pain: no Dumping syndrome: no   NUTRITION DIAGNOSIS  Overweight/obesity ([place]-3.3) related to past poor dietary habits and physical inactivity as evidenced by patient w/ completed Sleeve Gastrectomy surgery following dietary guidelines for continued weight loss.   NUTRITION INTERVENTION Nutrition counseling (C-1) and education (E-2) to facilitate bariatric surgery goals, including: . Diet consisting mainly of protein foods and non-starchy vegetables. Discussed incorporation of starchy vegetables and keeping carbohydrates/fats to a minimum.  . The importance of consuming adequate calories as well as certain nutrients daily due to the body's need for essential vitamins, minerals, and fats . The importance of daily physical activity and to reach a goal of at least 150 minutes of moderate to vigorous physical activity weekly (or as directed by their physician) due to benefits such as increased musculature and improved lab values  Goals: Add more variety of vegetables to your salad and edamame  Do not eat granola bars for your snacks  Try meatless crumbles in the frozen food aisle   Handouts Provided Include   Detailed myplate  Learning Style & Readiness for Change Teaching method utilized: Visual & Auditory  Demonstrated degree of understanding via: Teach Back  Barriers to learning/adherence to lifestyle change: Contemplative Stage of Change  RD's Notes for Next Visit . Make sure pt is taking bariatric MVI supplements again.  . Set new goals for food and physical activity.  . Encourage increased physical activity.    MONITORING & EVALUATION Dietary intake, weekly physical activity, and body weight in 3 to 4 months.  Next Steps Patient is to follow-up

## 2019-10-12 ENCOUNTER — Other Ambulatory Visit: Payer: Self-pay | Admitting: Interventional Cardiology

## 2019-10-15 DIAGNOSIS — Z1212 Encounter for screening for malignant neoplasm of rectum: Secondary | ICD-10-CM | POA: Diagnosis not present

## 2019-10-15 DIAGNOSIS — Z1211 Encounter for screening for malignant neoplasm of colon: Secondary | ICD-10-CM | POA: Diagnosis not present

## 2019-10-22 ENCOUNTER — Encounter: Payer: Medicare Other | Admitting: Skilled Nursing Facility1

## 2019-10-22 ENCOUNTER — Other Ambulatory Visit: Payer: Self-pay

## 2019-10-22 DIAGNOSIS — Z713 Dietary counseling and surveillance: Secondary | ICD-10-CM | POA: Diagnosis not present

## 2019-10-22 DIAGNOSIS — E669 Obesity, unspecified: Secondary | ICD-10-CM

## 2019-10-22 NOTE — Progress Notes (Signed)
Bariatric Follow-Up Visit Medical Nutrition Therapy   Post-Operative Sleeve Gastrectomy Surgery  Primary Concerns Today: Bariatric Follow Up.    NUTRITION ASSESSMENT  Anthropometrics  Start weight at [facility]: 326.8 lbs (date: 04/28/2017) Today's weight: pt declined (states she has lost ~75 lbs since surgery on 01/24/2018)  Clinical Medical Hx: obesity, chronic diastolic heart failure, interstitial lung disease/asthma, CKD, HTN, on chronic oxygen, valvular heart disease s/p aortic and mitral valve replacement on chronic Coumadin   Pt states she has been working on eating more vegtables which has gone well. Pt states she will get the appropriate multi Wendsday. Pt states he does not eat broccoli. Pt states she did not try the meatless crumbles. Pt states she does not have any activity currently.    24-Hr Dietary Recall First Meal: 2 packs of grits  Snack: popcorn  Second Meal: chicken + salad-iceburg, tomatoes, sometimes peppers black olives, peppers, cheese, french dressing sometimes with [place] pepperoni  Snack: fruit Third Meal: chicken and tomato (bought out) or homemade pizza + peppers + onions + [place] pepperoni or soy sausage and egg Snack: white cheddar popcorn  Beverages: water + Crystal Light lemonade, plain water   Supplements: not taking bariatric MVI not taking calcium Estimated Daily Fluid Intake: 100 oz Estimated Daily Protein Intake: 60+ g  Physical Activity  Current average weekly physical activity: ADL's  Post-Op Goals Using straws: no Drinking while eating: no Chewing/swallowing difficulties: no Changes in vision: no Changes to mood/headaches: no Hair loss/changes to skin/nails: nails cracked Difficulty focusing/concentrating: no Sweating: no Dizziness/lightheadedness: no Palpitations: no  Carbonated/caffeinated beverages: yes (Sprite occasionally)  N/V/D/C/Gas: no Abdominal pain: no Dumping syndrome: no   NUTRITION DIAGNOSIS  Overweight/obesity  ([place]-3.3) related to past poor dietary habits and physical inactivity as evidenced by patient w/ completed Sleeve Gastrectomy surgery following dietary guidelines for continued weight loss.   NUTRITION INTERVENTION Nutrition counseling (C-1) and education (E-2) to facilitate bariatric surgery goals, including: . Diet consisting mainly of protein foods and non-starchy vegetables. Discussed incorporation of starchy vegetables and keeping carbohydrates/fats to a minimum.  . The importance of consuming adequate calories as well as certain nutrients daily due to the body's need for essential vitamins, minerals, and fats . The importance of daily physical activity and to reach a goal of at least 150 minutes of moderate to vigorous physical activity weekly (or as directed by their physician) due to benefits such as increased musculature and improved lab values  Goals: Limit to 1 packet of grits and a protein like an egg  Stick to 2 cups for your popcorn, actually measuring it out Aim to walk 15 minutes 4 days a week  Handouts previously Provided Include   Detailed myplate   Learning Style & Readiness for Change Teaching method utilized: Visual & Auditory  Demonstrated degree of understanding via: Teach Back  Barriers to learning/adherence to lifestyle change: Contemplative Stage of Change  RD's Notes for Next Visit . Make sure pt is taking bariatric MVI supplements again.  . Encourage increased physical activity.    MONITORING & EVALUATION Dietary intake, weekly physical activity, and body weight in 3 to 4 months.  Next Steps Patient is to follow-up

## 2019-10-24 ENCOUNTER — Other Ambulatory Visit: Payer: Self-pay

## 2019-10-24 ENCOUNTER — Ambulatory Visit
Admission: RE | Admit: 2019-10-24 | Discharge: 2019-10-24 | Disposition: A | Discharge status: Home / Self Care | Payer: Medicare Other | Source: Ambulatory Visit | Attending: Nurse Practitioner | Admitting: Nurse Practitioner

## 2019-10-24 DIAGNOSIS — Z1231 Encounter for screening mammogram for malignant neoplasm of breast: Secondary | ICD-10-CM | POA: Diagnosis not present

## 2019-10-30 ENCOUNTER — Other Ambulatory Visit: Payer: Self-pay

## 2019-10-30 ENCOUNTER — Ambulatory Visit (INDEPENDENT_AMBULATORY_CARE_PROVIDER_SITE_OTHER): Payer: Medicare Other | Admitting: Pharmacist

## 2019-10-30 DIAGNOSIS — I480 Paroxysmal atrial fibrillation: Secondary | ICD-10-CM

## 2019-10-30 DIAGNOSIS — Z7901 Long term (current) use of anticoagulants: Secondary | ICD-10-CM

## 2019-10-30 DIAGNOSIS — I350 Nonrheumatic aortic (valve) stenosis: Secondary | ICD-10-CM | POA: Diagnosis not present

## 2019-10-30 LAB — POCT INR: INR: 2.7 (ref 2.0–3.0)

## 2019-10-30 NOTE — Patient Instructions (Signed)
Description   Continue taking warfarin 1 tablet every day except 1/2 tablet on Mondays.   Recheck INR in 6 weeks. Coumadin Clinic # [phone].

## 2019-11-04 ENCOUNTER — Other Ambulatory Visit: Payer: Self-pay | Admitting: Cardiovascular Disease

## 2019-11-12 ENCOUNTER — Other Ambulatory Visit: Payer: Self-pay

## 2019-11-12 ENCOUNTER — Encounter: Payer: Medicare Other | Attending: Student | Admitting: Skilled Nursing Facility1

## 2019-11-12 DIAGNOSIS — Z713 Dietary counseling and surveillance: Secondary | ICD-10-CM | POA: Insufficient documentation

## 2019-11-12 DIAGNOSIS — E669 Obesity, unspecified: Secondary | ICD-10-CM

## 2019-11-12 DIAGNOSIS — Z6841 Body Mass Index (BMI) 40.0 and over, adult: Secondary | ICD-10-CM | POA: Insufficient documentation

## 2019-11-12 NOTE — Progress Notes (Signed)
Bariatric Follow-Up Visit Medical Nutrition Therapy   Post-Operative Sleeve Gastrectomy Surgery  Primary Concerns Today: Bariatric Follow Up.    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 Pt states things have been alright but it still has been a struggle. Pt states preplanning meals for the week would help her. Pt states she has not gotten a multivitamin.    24-Hr Dietary Recall First Meal: 2 packs of grits or 1 boiled egg Snack: popcorn  Second Meal: chicken + salad-[place], tomatoes, sometimes peppers black olives, peppers, cheese, french dressing sometimes with [place] pepperoni  Snack: fruit Third Meal: chicken and tomato (bought out) or homemade pizza + peppers + onions + [place] pepperoni or soy sausage and egg Snack: white cheddar popcorn  Beverages: water + Crystal Light lemonade, plain water   Supplements: not taking bariatric MVI; taking calcium Estimated Daily Fluid Intake: 100 oz Estimated Daily Protein Intake: 60+ g  Physical Activity  Current average weekly physical activity: ADL's  Post-Op Goals Using straws: no Drinking while eating: no Chewing/swallowing difficulties: no Changes in vision: no Changes to mood/headaches: no Hair loss/changes to skin/nails: nails cracked Difficulty focusing/concentrating: no Sweating: no Dizziness/lightheadedness: no Palpitations: no  Carbonated/caffeinated beverages: yes (Sprite occasionally)  N/V/D/C/Gas: no Abdominal pain: no Dumping syndrome: no   NUTRITION DIAGNOSIS  Overweight/obesity ([place]-3.3) related to past poor dietary  habits and physical inactivity as evidenced by patient w/ completed Sleeve Gastrectomy surgery following dietary guidelines for continued weight loss.   NUTRITION INTERVENTION Nutrition counseling (C-1) and education (E-2) to facilitate bariatric surgery goals, including: . Diet consisting mainly of protein foods and non-starchy vegetables. Discussed incorporation of starchy vegetables and keeping carbohydrates/fats to a minimum.  . The importance of consuming adequate calories as well as certain nutrients daily due to the body's need for essential vitamins, minerals, and fats . The importance of daily physical activity and to reach a goal of at least 150 minutes of moderate to vigorous physical activity weekly (or as directed by their physician) due to benefits such as increased musculature and improved lab values  Goals:  Use your myplate sheet to make meals Preping meals for the week taking each Saturday to do so Also grocery shopping on saturdays; 2 meals each day to be planned Each week at the grocery store get 3 different snacks and change up the snack each week Aim to walk 15 minutes at a time 4 days a week; start it early in the day  Handouts previously Provided Include   Detailed myplate   Resources list   Snack sheet  Learning Style & Readiness for Change Teaching method utilized: Visual & Auditory  Demonstrated degree of understanding via: Teach Back  Barriers to learning/adherence to lifestyle change: Contemplative Stage of Change  RD's Notes for Next Visit . Make sure pt is taking bariatric MVI supplements again.  . Encourage increased physical activity.    MONITORING & EVALUATION Dietary intake, weekly physical activity, and body weight in .  Next Steps Patient is to follow-up

## 2019-11-14 ENCOUNTER — Ambulatory Visit: Payer: Medicare Other | Admitting: Skilled Nursing Facility1

## 2019-12-11 ENCOUNTER — Other Ambulatory Visit: Payer: Self-pay

## 2019-12-11 ENCOUNTER — Ambulatory Visit (INDEPENDENT_AMBULATORY_CARE_PROVIDER_SITE_OTHER): Payer: Medicare Other | Admitting: Pharmacist Clinician (PhC)/ Clinical Pharmacy Specialist

## 2019-12-11 DIAGNOSIS — I480 Paroxysmal atrial fibrillation: Secondary | ICD-10-CM | POA: Diagnosis not present

## 2019-12-11 DIAGNOSIS — Z5181 Encounter for therapeutic drug level monitoring: Secondary | ICD-10-CM | POA: Diagnosis not present

## 2019-12-11 LAB — POCT INR: INR: 3.2 — AB (ref 2.0–3.0)

## 2019-12-11 NOTE — Patient Instructions (Signed)
Continue taking warfarin 1 tablet every day except 1/2 tablet on Mondays.   Recheck INR in 6 weeks. [facility] # [phone].

## 2019-12-12 ENCOUNTER — Ambulatory Visit: Payer: Medicare Other | Admitting: Skilled Nursing Facility1

## 2020-01-22 ENCOUNTER — Ambulatory Visit (INDEPENDENT_AMBULATORY_CARE_PROVIDER_SITE_OTHER): Payer: Medicare Other | Admitting: Pharmacist

## 2020-01-22 ENCOUNTER — Other Ambulatory Visit: Payer: Self-pay

## 2020-01-22 DIAGNOSIS — I480 Paroxysmal atrial fibrillation: Secondary | ICD-10-CM

## 2020-01-22 LAB — POCT INR: INR: 3.3 — AB (ref 2.0–3.0)

## 2020-01-22 NOTE — Patient Instructions (Signed)
Description   Continue taking warfarin 1 tablet every day except 1/2 tablet on Mondays.   Recheck INR in 6 weeks. [facility]in [facility] # [phone].

## 2020-01-29 ENCOUNTER — Telehealth: Payer: Self-pay | Admitting: Interventional Cardiology

## 2020-01-29 NOTE — Telephone Encounter (Signed)
Okay to proceed with extractions. Will need Lovenix bridge per pharnacy.

## 2020-01-29 NOTE — Telephone Encounter (Signed)
° °  [facility] Pre-operative Risk Assessment    HEARTCARE STAFF: - Please ensure there is not already an duplicate clearance open for this procedure. - Under Visit Info/Reason for Call, type in Other and utilize the format Clearance MM/DD/YY or Clearance TBD. Do not use dashes or single digits. - If request is for dental extraction, please clarify the # of teeth to be extracted.  Request for surgical clearance:  1. What type of surgery is being performed? 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 When is this surgery scheduled? TBD based on Cardiac Clearance   3. What type of clearance is required (medical clearance vs. Pharmacy clearance to hold med vs. Both)? Both  4. Are there any medications that need to be held prior to surgery and how long?  5. Practice name and name of physician performing surgery? Dr. Diona Browner, Oral Surgery  6. What is the office phone number? [phone]   7.   What is the office fax number? [phone]  8.   Anesthesia type (None, local, MAC, general) ? general   [name] 01/29/2020, 12:00 PM  _________________________________________________________________   (provider comments below)

## 2020-01-29 NOTE — Telephone Encounter (Signed)
   Primary Cardiologist: Sinclair Grooms, MD  Chart reviewed as part of pre-operative protocol coverage. Patient was contacted 01/29/2020 in reference to pre-operative risk assessment for pending surgery as outlined below.  [name] was last seen on 08/24/19 by [name] with history of valvular heart disease with 25 mm mechanical mitral valve and 19 mm mechanical aortic valve replacements , idiopathic pulmonary hemosiderosis, ILD, polycythemia requiring phlebotomies, atrial flutter, Bell's palsy, CKD III, HTN, HLD, chronic dCHF, previous requirement for home O2 therapy, OSA and morbid obesity with subsequent bariatric surgery. Per notes her post-op echoes have demonstrated valves small for her size. Cath 2016 without significant CAD. Last echo 07/2019 with normal LV function. Given comorbidities has previously been deemed increase risk for surgery.  I spoke with the patient today who affirms clinical stability since her last OV. However, due to knee issues she is not very mobile therefore does not achieve >4 METS regularly. She is able to do housework but no regular exertion. She feels satisfied with the way she is feeling and denies any acute problems. Given that she cannot complete over 4 METS due to functional limitations as well as complex medical history will route to [name] for his input on final clearance for proceeding.  Will also route to pharmacy team for input on anticoagulation, which I assume will require the input of [name] as well. The patient was previously documented to have had prior issues with bleeding/hematoma during her gastric bypass surgery. In 12/2018 during preop for shoulder repair Dr Tamala Julian had suggested "She has high bleeding risk as demonstrated after prior surgery. Agree with Lovenox given mitral & aortic valve prostheses. I am okay being off Lovenox 12-24 hours prior to surgery and resuming 24-48 hours post.  She is high risk for complications. "  [name] -  Please route thoughts to [facility] PREOP (the pre-op pool). Thank you.  She will also need SBE ppx prior to procedure which should be addressed when finalizing procedure.  Charlie Pitter, PA-C 01/29/2020, 1:05 PM

## 2020-01-30 MED ORDER — AMOXICILLIN 500 MG PO CAPS
ORAL_CAPSULE | ORAL | 0 refills | Status: DC
Start: 2020-01-30 — End: 2020-12-27

## 2020-01-30 NOTE — Telephone Encounter (Signed)
Gwynn with Dr. Vira Blanco office is returning call to pre-op.

## 2020-01-30 NOTE — Telephone Encounter (Signed)
Patient will need lovenox bridge due to mechanical mitral valve. crcl 53ml/min. We will coordinate this in coumadin clinic. Please have patient call the coumadin clinic when procedure is scheduled so we can coordinate the bridge.  She will need SBE prophylaxis. No allergies therefore recommend amoxicillin 2g 30 min-1 hr prior to dental work. This has been sent in to [facility].

## 2020-01-30 NOTE — Telephone Encounter (Signed)
Left message for [facility] with dental office to please call back and ask the operator to message me when she does call back.   I also s/w pt and she has been informed SBE has been called into [facility] for her. I went over the instructions for Amoxicillin 2g. I explained to her that it will be a 500 mg tablet, she will take 4 tabs 30-60 minutes prior to dental procedure. Pt also aware she will need Lovenox Bridging due to her being on Coumadin. Pt states she is familiar with Lovenox Bridging in the past.

## 2020-01-30 NOTE — Telephone Encounter (Signed)
Left message to please call our office to discuss clearance recommendations in regards to pt will need lovenox bridging. Left message to please ask for the pre op call back team.

## 2020-01-30 NOTE — Telephone Encounter (Signed)
   Primary Cardiologist: Sinclair Grooms, MD  Chart reviewed as part of pre-operative protocol coverage. Per discussion with [name], the patient is cleared to proceed with extractions as requested.   Patient will need lovenox bridge due to mechanical mitral valve. She will also need SBE (endocarditis) prophylaxis in the form of amoxicillin 2g 30 min-1 hour prior to dental work. Pharmacist has sent in her [facility] to [facility].  I will route this message to our callback team to let patient know 1) she has been cleared for her procedure, 2) to contact our office if she has any new/worsening symptoms between now and procedure date, 3) instructions above for endocarditis prophylaxis prior to dental work (both this procedure and ANY dental work - rx sent in), and 4) to contact our office when procedure is scheduled so our Coumadin clinic can assist with her Lovenox bridge.  Otherwise I will route this recommendation to the requesting party via Epic fax function and remove from pre-op pool.  Please call with questions.  Charlie Pitter, PA-C 01/30/2020, 8:18 AM

## 2020-01-30 NOTE — Telephone Encounter (Signed)
I s/w Dr. Hoyt Koch office who stated Gwyen just left though did tell staff if I did call back to let me know she will call me back tomorrow to discuss clearance recommendations.

## 2020-01-31 NOTE — Telephone Encounter (Signed)
Left message for Gwynn that clearance info concerning patient/med instructions has been sent on 01/30/20 and if there are additional questions/concerns to call back

## 2020-01-31 NOTE — Telephone Encounter (Signed)
Gwynn with Dr. Vira Blanco office is returning call.

## 2020-02-05 ENCOUNTER — Other Ambulatory Visit: Payer: Self-pay | Admitting: Nurse Practitioner

## 2020-02-05 NOTE — Telephone Encounter (Signed)
lvm for pt to call refill dept about lasix to confirm correct dosage.

## 2020-02-06 ENCOUNTER — Telehealth: Payer: Self-pay | Admitting: *Deleted

## 2020-02-06 NOTE — Telephone Encounter (Signed)
S/w pt to clarify pt's lasix dose.  Pt is taking two tablets by mouth ( 80 mg) in the am and one tablet by mouth in the pm (40 mg ). Sent in today to requested pharmacy.

## 2020-03-05 ENCOUNTER — Telehealth: Payer: Self-pay

## 2020-03-10 NOTE — Telephone Encounter (Signed)
lpmtcb regarding upcoming dental procedure and Lovenox bridging.

## 2020-03-12 DIAGNOSIS — J9611 Chronic respiratory failure with hypoxia: Secondary | ICD-10-CM | POA: Diagnosis not present

## 2020-03-12 DIAGNOSIS — M25561 Pain in right knee: Secondary | ICD-10-CM | POA: Diagnosis not present

## 2020-03-12 DIAGNOSIS — Z23 Encounter for immunization: Secondary | ICD-10-CM | POA: Diagnosis not present

## 2020-03-12 DIAGNOSIS — G8929 Other chronic pain: Secondary | ICD-10-CM | POA: Diagnosis not present

## 2020-03-12 DIAGNOSIS — J449 Chronic obstructive pulmonary disease, unspecified: Secondary | ICD-10-CM | POA: Diagnosis not present

## 2020-03-18 ENCOUNTER — Other Ambulatory Visit: Payer: Self-pay

## 2020-03-18 ENCOUNTER — Ambulatory Visit (INDEPENDENT_AMBULATORY_CARE_PROVIDER_SITE_OTHER): Payer: Medicare Other

## 2020-03-18 DIAGNOSIS — M1711 Unilateral primary osteoarthritis, right knee: Secondary | ICD-10-CM | POA: Diagnosis not present

## 2020-03-18 DIAGNOSIS — I480 Paroxysmal atrial fibrillation: Secondary | ICD-10-CM | POA: Diagnosis not present

## 2020-03-18 DIAGNOSIS — Z5181 Encounter for therapeutic drug level monitoring: Secondary | ICD-10-CM | POA: Diagnosis not present

## 2020-03-18 LAB — POCT INR: INR: 7.8 — AB (ref 2.0–3.0)

## 2020-03-18 NOTE — Patient Instructions (Signed)
Hold Tuesday, Wednesday, Thursday and Friday and then Continue taking warfarin 1 tablet every day except 1/2 tablet on Mondays.   Recheck INR in 1 week. [facility]in [facility] # [phone].

## 2020-03-25 ENCOUNTER — Ambulatory Visit (INDEPENDENT_AMBULATORY_CARE_PROVIDER_SITE_OTHER): Payer: Medicare Other | Admitting: Pharmacist

## 2020-03-25 ENCOUNTER — Other Ambulatory Visit: Payer: Self-pay

## 2020-03-25 DIAGNOSIS — I480 Paroxysmal atrial fibrillation: Secondary | ICD-10-CM | POA: Diagnosis not present

## 2020-03-25 LAB — POCT INR: INR: 2.5 (ref 2.0–3.0)

## 2020-03-25 NOTE — Patient Instructions (Signed)
Description   Continue taking warfarin 1 tablet every day except 1/2 tablet on Mondays.   Recheck INR in 3 weeks. [facility] # [phone].

## 2020-04-15 ENCOUNTER — Other Ambulatory Visit: Payer: Self-pay

## 2020-04-15 ENCOUNTER — Ambulatory Visit (INDEPENDENT_AMBULATORY_CARE_PROVIDER_SITE_OTHER): Payer: Medicare Other

## 2020-04-15 DIAGNOSIS — Z5181 Encounter for therapeutic drug level monitoring: Secondary | ICD-10-CM

## 2020-04-15 DIAGNOSIS — I480 Paroxysmal atrial fibrillation: Secondary | ICD-10-CM

## 2020-04-15 LAB — POCT INR: INR: 5.8 — AB (ref 2.0–3.0)

## 2020-04-15 NOTE — Patient Instructions (Signed)
Hold today and Wednesday only and then Continue taking warfarin 1 tablet every day except 1/2 tablet on Mondays.   Recheck INR in 2 weeks. [facility]in Clinic # [phone].

## 2020-04-29 ENCOUNTER — Other Ambulatory Visit: Payer: Self-pay

## 2020-04-29 ENCOUNTER — Ambulatory Visit (INDEPENDENT_AMBULATORY_CARE_PROVIDER_SITE_OTHER): Payer: Medicare Other

## 2020-04-29 DIAGNOSIS — Z5181 Encounter for therapeutic drug level monitoring: Secondary | ICD-10-CM | POA: Diagnosis not present

## 2020-04-29 DIAGNOSIS — I480 Paroxysmal atrial fibrillation: Secondary | ICD-10-CM | POA: Diagnosis not present

## 2020-04-29 LAB — POCT INR: INR: 5.2 — AB (ref 2.0–3.0)

## 2020-04-29 NOTE — Patient Instructions (Signed)
Hold today and Wednesday only and then decrease to 0.5 tablet every day except 1 tablet on Monday, Wednesday and Friday  Recheck INR in 2 weeks. [facility]in Clinic # [phone].

## 2020-05-07 ENCOUNTER — Other Ambulatory Visit: Payer: Self-pay | Admitting: Interventional Cardiology
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
## 2020-05-09 ENCOUNTER — Ambulatory Visit (INDEPENDENT_AMBULATORY_CARE_PROVIDER_SITE_OTHER): Payer: Medicare Other | Admitting: Interventional Cardiology

## 2020-05-09 ENCOUNTER — Encounter: Payer: Self-pay | Admitting: Interventional Cardiology

## 2020-05-09 ENCOUNTER — Other Ambulatory Visit: Payer: Self-pay

## 2020-05-09 VITALS — BP 170/98 | HR 62 | Ht 66.5 in | Wt 278.0 lb

## 2020-05-09 DIAGNOSIS — Z01818 Encounter for other preprocedural examination: Secondary | ICD-10-CM | POA: Diagnosis not present

## 2020-05-09 DIAGNOSIS — I10 Essential (primary) hypertension: Secondary | ICD-10-CM | POA: Diagnosis not present

## 2020-05-09 DIAGNOSIS — Z7189 Other specified counseling: Secondary | ICD-10-CM | POA: Diagnosis not present

## 2020-05-09 DIAGNOSIS — I480 Paroxysmal atrial fibrillation: Secondary | ICD-10-CM

## 2020-05-09 DIAGNOSIS — N183 Chronic kidney disease, stage 3 unspecified: Secondary | ICD-10-CM

## 2020-05-09 DIAGNOSIS — I5032 Chronic diastolic (congestive) heart failure: Secondary | ICD-10-CM | POA: Diagnosis not present

## 2020-05-09 DIAGNOSIS — Z7901 Long term (current) use of anticoagulants: Secondary | ICD-10-CM

## 2020-05-09 DIAGNOSIS — Z952 Presence of prosthetic heart valve: Secondary | ICD-10-CM | POA: Diagnosis not present

## 2020-05-09 DIAGNOSIS — Z5181 Encounter for therapeutic drug level monitoring: Secondary | ICD-10-CM

## 2020-05-09 MED ORDER — LOSARTAN POTASSIUM 50 MG PO TABS: 50.0000 mg | ORAL_TABLET | Freq: Every day | ORAL | 3 refills | Status: DC

## 2020-05-09 NOTE — Patient Instructions (Signed)
Medication Instructions:  1) START Losartan 50mg  once daily  *If you need a refill on your cardiac medications before your next appointment, please call your pharmacy*   Lab Work: BMET in 7-14 days  If you have labs (blood work) drawn today and your tests are completely normal, you will receive your results only by: Marland Kitchen MyChart Message (if you have MyChart) OR . A paper copy in the mail If you have any lab test that is abnormal or we need to change your treatment, we will call you to review the results.   Testing/Procedures: None   Follow-Up: At [facility], you and your health needs are our priority.  As part of our continuing mission to provide you with exceptional heart care, we have created designated Provider Care Teams.  These Care Teams include your primary Cardiologist (physician) and Advanced Practice Providers (APPs -  Physician Assistants and Nurse Practitioners) who all work together to provide you with the care you need, when you need it.  We recommend signing up for the patient portal called "MyChart".  Sign up information is provided on this After Visit Summary.  MyChart is used to connect with patients for Virtual Visits (Telemedicine).  Patients are able to view lab/test results, encounter notes, upcoming appointments, etc.  Non-urgent messages can be sent to your provider as well.   To learn more about what you can do with MyChart, go to [email or web address].    Your next appointment:   1 month(s)  The format for your next appointment:   In Person  Provider:   You will see one of the following Advanced Practice Providers on your designated Care Team:    [name], NP  Cecilie Kicks, NP  Kathyrn Drown, NP  Then, Sinclair Grooms, MD will plan to see you again in 1 year(s).   Other Instructions  You will need to be bridged with Lovenox prior to your dental procedure.  I will let the Coumadin clinic know that this procedure is coming up, but not yet  scheduled.  Be sure to reach out once you have a date and set up bridging.

## 2020-05-09 NOTE — Addendum Note (Signed)
Addended by: Loren Racer on: 05/09/2020 10:04 AM   Modules accepted: Orders

## 2020-05-13 ENCOUNTER — Ambulatory Visit (INDEPENDENT_AMBULATORY_CARE_PROVIDER_SITE_OTHER): Payer: Medicare Other

## 2020-05-13 ENCOUNTER — Other Ambulatory Visit: Payer: Self-pay

## 2020-05-13 DIAGNOSIS — I480 Paroxysmal atrial fibrillation: Secondary | ICD-10-CM

## 2020-05-13 DIAGNOSIS — Z5181 Encounter for therapeutic drug level monitoring: Secondary | ICD-10-CM | POA: Diagnosis not present

## 2020-05-13 LAB — POCT INR: INR: 2.2 (ref 2.0–3.0)

## 2020-05-13 NOTE — Patient Instructions (Signed)
Take 1.5 tablets today and Continue 0.5 tablet every day except 1 tablet on Monday, Wednesday and Friday  Recheck INR in 2 weeks. Coumadin Clinic # [phone].

## 2020-05-26 ENCOUNTER — Telehealth: Payer: Self-pay

## 2020-05-26 NOTE — Telephone Encounter (Signed)
Lpm in regards to upcoming dental procedure and Lovenox bridging.

## 2020-06-04 DIAGNOSIS — I480 Paroxysmal atrial fibrillation: Secondary | ICD-10-CM | POA: Diagnosis not present

## 2020-06-04 DIAGNOSIS — Z23 Encounter for immunization: Secondary | ICD-10-CM | POA: Diagnosis not present

## 2020-06-04 DIAGNOSIS — I10 Essential (primary) hypertension: Secondary | ICD-10-CM | POA: Diagnosis not present

## 2020-06-05 LAB — BASIC METABOLIC PANEL
BUN/Creatinine Ratio: 16 (ref 12–28)
BUN: 20 mg/dL (ref 8–27)
CO2: 27 mmol/L (ref 20–29)
Calcium: 10.1 mg/dL (ref 8.7–10.3)
Chloride: 102 mmol/L (ref 96–106)
Creatinine, Ser: 1.24 mg/dL — ABNORMAL HIGH (ref 0.57–1.00)
GFR calc Af Amer: 55 mL/min/{1.73_m2} — ABNORMAL LOW (ref >59)
GFR calc non Af Amer: 47 mL/min/{1.73_m2} — ABNORMAL LOW (ref >59)
Glucose: 84 mg/dL (ref 65–99)
Potassium: 5 mmol/L (ref 3.5–5.2)
Sodium: 143 mmol/L (ref 134–144)

## 2020-06-13 ENCOUNTER — Ambulatory Visit: Admit: 2020-06-13 | Payer: Medicare Other | Admitting: Oral Surgery

## 2020-06-13 SURGERY — DENTAL RESTORATION/EXTRACTIONS
Anesthesia: General
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
## 2020-06-19 ENCOUNTER — Ambulatory Visit (INDEPENDENT_AMBULATORY_CARE_PROVIDER_SITE_OTHER): Payer: Medicare Other | Admitting: *Deleted

## 2020-06-19 ENCOUNTER — Encounter: Payer: Self-pay | Admitting: Cardiology

## 2020-06-19 ENCOUNTER — Ambulatory Visit (INDEPENDENT_AMBULATORY_CARE_PROVIDER_SITE_OTHER): Payer: Medicare Other | Admitting: Cardiology

## 2020-06-19 ENCOUNTER — Other Ambulatory Visit: Payer: Self-pay

## 2020-06-19 VITALS — BP 120/72 | HR 68 | Ht 66.5 in | Wt 281.6 lb

## 2020-06-19 DIAGNOSIS — E7800 Pure hypercholesterolemia, unspecified: Secondary | ICD-10-CM

## 2020-06-19 DIAGNOSIS — Z7901 Long term (current) use of anticoagulants: Secondary | ICD-10-CM | POA: Diagnosis not present

## 2020-06-19 DIAGNOSIS — I10 Essential (primary) hypertension: Secondary | ICD-10-CM | POA: Diagnosis not present

## 2020-06-19 DIAGNOSIS — Z5181 Encounter for therapeutic drug level monitoring: Secondary | ICD-10-CM

## 2020-06-19 DIAGNOSIS — I5031 Acute diastolic (congestive) heart failure: Secondary | ICD-10-CM | POA: Diagnosis not present

## 2020-06-19 DIAGNOSIS — Z952 Presence of prosthetic heart valve: Secondary | ICD-10-CM | POA: Diagnosis not present

## 2020-06-19 DIAGNOSIS — I480 Paroxysmal atrial fibrillation: Secondary | ICD-10-CM

## 2020-06-19 LAB — POCT INR: INR: 3.3 — AB (ref 2.0–3.0)

## 2020-06-19 NOTE — Patient Instructions (Signed)
Medication Instructions:  Your physician recommends that you continue on your current medications as directed. Please refer to the Current Medication list given to you today.  *If you need a refill on your cardiac medications before your next appointment, please call your pharmacy*   Lab Work: None If you have labs (blood work) drawn today and your tests are completely normal, you will receive your results only by: Marland Kitchen MyChart Message (if you have MyChart) OR . A paper copy in the mail If you have any lab test that is abnormal or we need to change your treatment, we will call you to review the results.   Testing/Procedures: None   Follow-Up: At [facility], you and your health needs are our priority.  As part of our continuing mission to provide you with exceptional heart care, we have created designated Provider Care Teams.  These Care Teams include your primary Cardiologist (physician) and Advanced Practice Providers (APPs -  Physician Assistants and Nurse Practitioners) who all work together to provide you with the care you need, when you need it.  We recommend signing up for the patient portal called "MyChart".  Sign up information is provided on this After Visit Summary.  MyChart is used to connect with patients for Virtual Visits (Telemedicine).  Patients are able to view lab/test results, encounter notes, upcoming appointments, etc.  Non-urgent messages can be sent to your provider as well.   To learn more about what you can do with MyChart, go to [email or web address].    Your next appointment:   1 year(s)  The format for your next appointment:   In Person  Provider:   You may see Sinclair Grooms, MD or one of the following Advanced Practice Providers on your designated Care Team:    Cecilie Kicks, NP  Kathyrn Drown, NP

## 2020-06-19 NOTE — Patient Instructions (Signed)
Description   Continue taking 1/2 a  tablet every day except 1 tablet on Monday, Wednesday and Friday  Recheck INR in 3 weeks. [facility] # [phone].

## 2020-07-08 ENCOUNTER — Other Ambulatory Visit: Payer: Self-pay

## 2020-07-08 ENCOUNTER — Ambulatory Visit (INDEPENDENT_AMBULATORY_CARE_PROVIDER_SITE_OTHER): Payer: Medicare Other

## 2020-07-08 DIAGNOSIS — Z5181 Encounter for therapeutic drug level monitoring: Secondary | ICD-10-CM | POA: Diagnosis not present

## 2020-07-08 DIAGNOSIS — I480 Paroxysmal atrial fibrillation: Secondary | ICD-10-CM | POA: Diagnosis not present

## 2020-07-08 LAB — POCT INR: INR: 3 (ref 2.0–3.0)

## 2020-07-08 NOTE — Patient Instructions (Signed)
Continue taking 1/2 a  tablet every day except 1 tablet on Monday, Wednesday and Friday  Recheck INR in 6 weeks. [facility] Clinic # [phone].

## 2020-08-08 ENCOUNTER — Other Ambulatory Visit: Payer: Self-pay | Admitting: Interventional Cardiology

## 2020-08-12 ENCOUNTER — Other Ambulatory Visit: Payer: Self-pay | Admitting: *Deleted

## 2020-08-12 MED ORDER — LOSARTAN POTASSIUM 100 MG PO TABS: 50.0000 mg | ORAL_TABLET | Freq: Every day | ORAL | 3 refills | Status: DC

## 2020-08-13 ENCOUNTER — Other Ambulatory Visit: Payer: Self-pay | Admitting: Interventional Cardiology

## 2020-08-13 NOTE — Telephone Encounter (Signed)
Spoke with pt and she would like to go ahead and switch to Irbesartan and stick with [facility].  Prescription sent.

## 2020-08-13 NOTE — Telephone Encounter (Signed)
Pt's pharmacy [facility] is requesting an alternative for Losartan, because all losartan are on backorder, without a release date. Pharmacy is suggesting prescribing lisinopril 20 mg tablets or something similar. Please address

## 2020-08-13 NOTE — Telephone Encounter (Signed)
Will route to PharmD to see what alternative medication is recommended.

## 2020-08-13 NOTE — Telephone Encounter (Signed)
[facility] is the only [facility] that cannot order losartan for whatever reason. If pt wishes to continue on losartan, can just send rx in to any other chain pharmacy. If she prefers to stay with [facility], equivalent ARB to losartan 50mg  daily would be irbesartan 150mg  daily and pt should keep an eye on BP at home.

## 2020-08-19 ENCOUNTER — Other Ambulatory Visit: Payer: Self-pay

## 2020-08-19 ENCOUNTER — Ambulatory Visit (INDEPENDENT_AMBULATORY_CARE_PROVIDER_SITE_OTHER): Payer: Medicare Other | Admitting: Pharmacist

## 2020-08-19 DIAGNOSIS — Z7901 Long term (current) use of anticoagulants: Secondary | ICD-10-CM

## 2020-08-19 DIAGNOSIS — I480 Paroxysmal atrial fibrillation: Secondary | ICD-10-CM | POA: Diagnosis not present

## 2020-08-19 DIAGNOSIS — I350 Nonrheumatic aortic (valve) stenosis: Secondary | ICD-10-CM

## 2020-08-19 LAB — POCT INR: INR: 3.1 — AB (ref 2.0–3.0)

## 2020-08-19 NOTE — Patient Instructions (Signed)
Description   Continue taking 1/2 a  tablet every day except 1 tablet on Monday, Wednesday and Friday  Recheck INR in 6 weeks. [facility] # [phone].

## 2020-09-04 DIAGNOSIS — Z961 Presence of intraocular lens: Secondary | ICD-10-CM | POA: Diagnosis not present

## 2020-09-12 DIAGNOSIS — R051 Acute cough: Secondary | ICD-10-CM | POA: Diagnosis not present

## 2020-09-12 DIAGNOSIS — Z1152 Encounter for screening for COVID-19: Secondary | ICD-10-CM | POA: Diagnosis not present

## 2020-09-12 DIAGNOSIS — J019 Acute sinusitis, unspecified: Secondary | ICD-10-CM | POA: Diagnosis not present

## 2020-10-06 DIAGNOSIS — Z0000 Encounter for general adult medical examination without abnormal findings: Secondary | ICD-10-CM | POA: Diagnosis not present

## 2020-10-07 ENCOUNTER — Other Ambulatory Visit: Payer: Self-pay

## 2020-10-07 ENCOUNTER — Ambulatory Visit (INDEPENDENT_AMBULATORY_CARE_PROVIDER_SITE_OTHER): Payer: Medicare Other

## 2020-10-07 DIAGNOSIS — I480 Paroxysmal atrial fibrillation: Secondary | ICD-10-CM | POA: Diagnosis not present

## 2020-10-07 DIAGNOSIS — Z5181 Encounter for therapeutic drug level monitoring: Secondary | ICD-10-CM | POA: Diagnosis not present

## 2020-10-07 LAB — POCT INR: INR: 2.6 (ref 2.0–3.0)

## 2020-10-07 NOTE — Patient Instructions (Signed)
Continue taking 1/2   tablet every day except 1 tablet on Monday, Wednesday and Friday  Recheck INR in 6 weeks. [facility] # [phone].

## 2020-10-15 DIAGNOSIS — R7301 Impaired fasting glucose: Secondary | ICD-10-CM | POA: Diagnosis not present

## 2020-10-15 DIAGNOSIS — I4821 Permanent atrial fibrillation: Secondary | ICD-10-CM | POA: Diagnosis not present

## 2020-10-15 DIAGNOSIS — J449 Chronic obstructive pulmonary disease, unspecified: Secondary | ICD-10-CM | POA: Diagnosis not present

## 2020-10-15 DIAGNOSIS — N183 Chronic kidney disease, stage 3 unspecified: Secondary | ICD-10-CM | POA: Diagnosis not present

## 2020-10-15 DIAGNOSIS — I10 Essential (primary) hypertension: Secondary | ICD-10-CM | POA: Diagnosis not present

## 2020-10-15 DIAGNOSIS — I5032 Chronic diastolic (congestive) heart failure: Secondary | ICD-10-CM | POA: Diagnosis not present

## 2020-10-15 DIAGNOSIS — E785 Hyperlipidemia, unspecified: Secondary | ICD-10-CM | POA: Diagnosis not present

## 2020-10-22 ENCOUNTER — Other Ambulatory Visit: Payer: Self-pay | Admitting: Interventional Cardiology

## 2020-11-18 ENCOUNTER — Other Ambulatory Visit: Payer: Self-pay

## 2020-11-18 ENCOUNTER — Ambulatory Visit (INDEPENDENT_AMBULATORY_CARE_PROVIDER_SITE_OTHER): Payer: Medicare Other

## 2020-11-18 DIAGNOSIS — I480 Paroxysmal atrial fibrillation: Secondary | ICD-10-CM

## 2020-11-18 DIAGNOSIS — Z5181 Encounter for therapeutic drug level monitoring: Secondary | ICD-10-CM | POA: Diagnosis not present

## 2020-11-18 LAB — POCT INR: INR: 2.3 (ref 2.0–3.0)

## 2020-11-18 NOTE — Patient Instructions (Signed)
Take 1 tablet today only and then Continue taking 1/2   tablet every day except 1 tablet on Monday, Wednesday and Friday.  Discuss Bridging  Recheck INR in 4 weeks. Cou[facility]in Clinic # [phone].

## 2020-12-08 ENCOUNTER — Other Ambulatory Visit: Payer: Self-pay | Admitting: Nurse Practitioner

## 2020-12-08 DIAGNOSIS — Z1231 Encounter for screening mammogram for malignant neoplasm of breast: Secondary | ICD-10-CM

## 2020-12-15 ENCOUNTER — Telehealth: Payer: Self-pay

## 2020-12-15 NOTE — Telephone Encounter (Signed)
I spoke to the patient who reached out to her dentist and he informed her that "everything is fine and that no further clearance is needed".  I will discuss Lovenox Bridging at 7/19 visit.

## 2020-12-16 ENCOUNTER — Other Ambulatory Visit: Payer: Self-pay

## 2020-12-16 ENCOUNTER — Ambulatory Visit (INDEPENDENT_AMBULATORY_CARE_PROVIDER_SITE_OTHER): Payer: Medicare Other

## 2020-12-16 DIAGNOSIS — Z5181 Encounter for therapeutic drug level monitoring: Secondary | ICD-10-CM | POA: Diagnosis not present

## 2020-12-16 DIAGNOSIS — I480 Paroxysmal atrial fibrillation: Secondary | ICD-10-CM

## 2020-12-16 LAB — POCT INR: INR: 3.4 — AB (ref 2.0–3.0)

## 2020-12-16 MED ORDER — ENOXAPARIN SODIUM 120 MG/0.8ML IJ SOSY: 120.0000 mg | PREFILLED_SYRINGE | Freq: Two times a day (BID) | INTRAMUSCULAR | 1 refills | Status: DC

## 2020-12-16 NOTE — Patient Instructions (Signed)
Continue taking 1/2   tablet every day except 1 tablet on Monday, Wednesday and Friday.  Follow Lovenox Bridging  Recheck INR in 2 weeks. [facility] # [phone].    7/23: Last dose of warfarin.  7/24: No warfarin or enoxaparin (Lovenox).  7/25: Inject enoxaparin  120mg  in the fatty abdominal tissue at least 2 inches from the belly button twice a day about 12 hours apart, 8am and 8pm rotate sites. No warfarin.  7/26: Inject enoxaparin in the fatty tissue every 12 hours, 8am and 8pm. No warfarin.  7/27: Inject enoxaparin in the fatty tissue every 12 hours, 8am and 8pm. No warfarin.  7/28: Inject enoxaparin in the fatty tissue in the morning at 8 am (No PM dose). No warfarin.  7/29: Procedure Day - No enoxaparin - Resume warfarin in the evening or as directed by doctor (take an extra half tablet with usual dose for 2 days then resume normal dose).  7/30: Resume enoxaparin inject in the fatty tissue every 12 hours and take warfarin  7/31: Inject enoxaparin in the fatty tissue every 12 hours and take warfarin  8/1: Inject enoxaparin in the fatty tissue every 12 hours and take warfarin  8/2: INR Check
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
## 2020-12-25 ENCOUNTER — Other Ambulatory Visit: Payer: Self-pay

## 2020-12-25 ENCOUNTER — Encounter (HOSPITAL_COMMUNITY): Payer: Self-pay | Admitting: Oral Surgery

## 2020-12-25 NOTE — Progress Notes (Signed)
[name] denies chest pain or shortness or breath; patient reported that she has not used oxygen in months.  [name] Patient denies having any s/s of Covid in her household.  Patient denies any known exposure to Covid.

## 2020-12-25 NOTE — Anesthesia Preprocedure Evaluation (Addendum)
Anesthesia Evaluation  Patient identified by MRN, date of birth, ID band Patient awake    Reviewed: Allergy & Precautions, NPO status , Patient's Chart, lab work & pertinent test results  Airway Mallampati: III  TM Distance: >3 FB Neck ROM: Full    Dental  (+) Poor Dentition   Pulmonary neg pulmonary ROS, asthma , sleep apnea , COPD, former smoker,    Pulmonary exam normal breath sounds clear to auscultation       Cardiovascular hypertension, +CHF  negative cardio ROS Normal cardiovascular exam+ dysrhythmias  Rhythm:Regular Rate:Normal  Anticoagulated for mechanical valves. On coumadin, should be on lovenox bridge for surgery  2021 echo:  1. Left ventricular ejection fraction, by estimation, is 55 to 60%. The  left ventricle has normal function. The left ventricle has no regional  wall motion abnormalities. Left ventricular diastolic function could not  be evaluated.  2. Right ventricular systolic function is normal. The right ventricular  size is mildly enlarged. There is mildly elevated pulmonary artery  systolic pressure. The estimated right ventricular systolic pressure is  89.2 mmHg.  3. Left atrial size was mildly dilated.  4. 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 Mean gradient 10.7 mmHG at  67 bpm. EOA 1.35 cm2 (0.58 cm/m2). P 1/2 93 msec. Peak E 2.3 m/s. Overall,  likely severe patient-prosthesis mismatch is present. . The mitral valve  has been repaired/replaced. No  evidence of mitral valve regurgitation. There is a 25 mm mechanical valve  present in the mitral position. Procedure Date: 08/06/2014.  5. 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 Vmax 3.1 m/s, mean gradient  20 mmHG, DI 0.48, EOA 1.67 cm2 (0.72 cm/m2). Suspect moderate  patient-prosthesis mismatch. Difficult to determine if there is  paravalvular leak as reported on prior  echocardiograms, TEE recommended to clarify. The aortic valve has been   repaired/replaced. Aortic valve regurgitation is not visualized. There is  a 19 mm mechanical valve present in the aortic position. Procedure Date:  08/06/2014.  6. The inferior vena cava is normal in size with greater than 50%  respiratory variability, suggesting right atrial pressure of 3 mmHg.   Comparison(s): A prior study was performed on 01/19/2019. No significant  change from prior study. Prior images reviewed side by side. Gradients  across both prostheses are stable.    Neuro/Psych PSYCHIATRIC DISORDERS Anxiety Depression  Neuromuscular disease (Bell's palsy) negative neurological ROS  negative psych ROS   GI/Hepatic negative GI ROS, Neg liver ROS, hiatal hernia, GERD  ,Gastric weight loss surgery in 2019   Endo/Other  Morbid obesity  Renal/GU Renal InsufficiencyRenal diseasenegative Renal ROS  negative genitourinary   Musculoskeletal negative musculoskeletal ROS (+) Arthritis ,   Abdominal   Peds negative pediatric ROS (+)  Hematology negative hematology ROS (+)   Anesthesia Other Findings   Reproductive/Obstetrics negative OB ROS                            Anesthesia Physical Anesthesia Plan  ASA: 3  Anesthesia Plan: General   Post-op Pain Management:    Induction: Intravenous  PONV Risk Score and Plan: 3 and Treatment may vary due to age or medical condition, Midazolam, Ondansetron and Dexamethasone  Airway Management Planned: Nasal ETT  Additional Equipment: None  Intra-op Plan:   Post-operative Plan: Extubation in OR  Informed Consent: I have reviewed the patients History and Physical, chart, labs and discussed the procedure including the risks, benefits and alternatives for the  proposed anesthesia with the patient or authorized representative who has indicated his/her understanding and acceptance.     Dental advisory given  Plan Discussed with: CRNA and Anesthesiologist  Anesthesia Plan Comments: (Afrin to  bilateral nares. Nasal ETT, Coags drawn this AM. Will await results prior to placing a nasal ETT. Norton Blizzard, MD   INR 1.0)      Anesthesia Quick Evaluation

## 2020-12-26 ENCOUNTER — Observation Stay (HOSPITAL_BASED_OUTPATIENT_CLINIC_OR_DEPARTMENT_OTHER)
Admission: RE | Admit: 2020-12-26 | Discharge: 2020-12-27 | Disposition: A | Discharge status: Home / Self Care | Payer: Medicare Other | Source: Home / Self Care | Attending: Oral Surgery | Admitting: Oral Surgery

## 2020-12-26 ENCOUNTER — Encounter (HOSPITAL_COMMUNITY)
Admission: RE | Disposition: A | Discharge status: Home / Self Care | Payer: Self-pay | Source: Home / Self Care | Attending: Oral Surgery

## 2020-12-26 ENCOUNTER — Ambulatory Visit (HOSPITAL_COMMUNITY): Payer: Medicare Other | Admitting: Anesthesiology

## 2020-12-26 ENCOUNTER — Encounter (HOSPITAL_COMMUNITY): Payer: Self-pay | Admitting: Oral Surgery

## 2020-12-26 DIAGNOSIS — T8119XA Other postprocedural shock, initial encounter: Secondary | ICD-10-CM | POA: Diagnosis not present

## 2020-12-26 DIAGNOSIS — K0821 Minimal atrophy of the mandible: Secondary | ICD-10-CM | POA: Insufficient documentation

## 2020-12-26 DIAGNOSIS — K449 Diaphragmatic hernia without obstruction or gangrene: Secondary | ICD-10-CM | POA: Diagnosis not present

## 2020-12-26 DIAGNOSIS — R221 Localized swelling, mass and lump, neck: Secondary | ICD-10-CM | POA: Diagnosis not present

## 2020-12-26 DIAGNOSIS — K0820 Unspecified atrophy of edentulous alveolar ridge: Secondary | ICD-10-CM | POA: Diagnosis not present

## 2020-12-26 DIAGNOSIS — I509 Heart failure, unspecified: Secondary | ICD-10-CM | POA: Insufficient documentation

## 2020-12-26 DIAGNOSIS — D1039 Benign neoplasm of other parts of mouth: Secondary | ICD-10-CM | POA: Diagnosis not present

## 2020-12-26 DIAGNOSIS — K056 Periodontal disease, unspecified: Secondary | ICD-10-CM | POA: Insufficient documentation

## 2020-12-26 DIAGNOSIS — K0826 Severe atrophy of the maxilla: Secondary | ICD-10-CM

## 2020-12-26 DIAGNOSIS — K011 Impacted teeth: Secondary | ICD-10-CM | POA: Diagnosis not present

## 2020-12-26 DIAGNOSIS — E871 Hypo-osmolality and hyponatremia: Secondary | ICD-10-CM | POA: Diagnosis not present

## 2020-12-26 DIAGNOSIS — J9621 Acute and chronic respiratory failure with hypoxia: Secondary | ICD-10-CM | POA: Diagnosis not present

## 2020-12-26 DIAGNOSIS — N170 Acute kidney failure with tubular necrosis: Secondary | ICD-10-CM | POA: Diagnosis not present

## 2020-12-26 DIAGNOSIS — I6523 Occlusion and stenosis of bilateral carotid arteries: Secondary | ICD-10-CM | POA: Diagnosis not present

## 2020-12-26 DIAGNOSIS — M7981 Nontraumatic hematoma of soft tissue: Secondary | ICD-10-CM | POA: Diagnosis not present

## 2020-12-26 DIAGNOSIS — I5032 Chronic diastolic (congestive) heart failure: Secondary | ICD-10-CM | POA: Diagnosis not present

## 2020-12-26 DIAGNOSIS — N179 Acute kidney failure, unspecified: Secondary | ICD-10-CM | POA: Diagnosis not present

## 2020-12-26 DIAGNOSIS — N183 Chronic kidney disease, stage 3 unspecified: Secondary | ICD-10-CM | POA: Insufficient documentation

## 2020-12-26 DIAGNOSIS — D62 Acute posthemorrhagic anemia: Secondary | ICD-10-CM | POA: Diagnosis not present

## 2020-12-26 DIAGNOSIS — K029 Dental caries, unspecified: Secondary | ICD-10-CM | POA: Insufficient documentation

## 2020-12-26 DIAGNOSIS — Z6841 Body Mass Index (BMI) 40.0 and over, adult: Secondary | ICD-10-CM | POA: Diagnosis not present

## 2020-12-26 DIAGNOSIS — Z20822 Contact with and (suspected) exposure to covid-19: Secondary | ICD-10-CM | POA: Diagnosis not present

## 2020-12-26 DIAGNOSIS — J8403 Idiopathic pulmonary hemosiderosis: Secondary | ICD-10-CM | POA: Diagnosis not present

## 2020-12-26 DIAGNOSIS — I130 Hypertensive heart and chronic kidney disease with heart failure and stage 1 through stage 4 chronic kidney disease, or unspecified chronic kidney disease: Secondary | ICD-10-CM | POA: Diagnosis not present

## 2020-12-26 DIAGNOSIS — D6832 Hemorrhagic disorder due to extrinsic circulating anticoagulants: Secondary | ICD-10-CM | POA: Diagnosis not present

## 2020-12-26 DIAGNOSIS — Z79899 Other long term (current) drug therapy: Secondary | ICD-10-CM | POA: Insufficient documentation

## 2020-12-26 DIAGNOSIS — K1370 Unspecified lesions of oral mucosa: Secondary | ICD-10-CM

## 2020-12-26 DIAGNOSIS — D210 Benign neoplasm of connective and other soft tissue of head, face and neck: Secondary | ICD-10-CM | POA: Diagnosis not present

## 2020-12-26 DIAGNOSIS — K135 Oral submucous fibrosis: Secondary | ICD-10-CM | POA: Insufficient documentation

## 2020-12-26 DIAGNOSIS — R571 Hypovolemic shock: Secondary | ICD-10-CM | POA: Diagnosis not present

## 2020-12-26 DIAGNOSIS — Z9889 Other specified postprocedural states: Secondary | ICD-10-CM

## 2020-12-26 DIAGNOSIS — S301XXA Contusion of abdominal wall, initial encounter: Secondary | ICD-10-CM | POA: Diagnosis not present

## 2020-12-26 DIAGNOSIS — R9431 Abnormal electrocardiogram [ECG] [EKG]: Secondary | ICD-10-CM | POA: Diagnosis not present

## 2020-12-26 DIAGNOSIS — K8020 Calculus of gallbladder without cholecystitis without obstruction: Secondary | ICD-10-CM | POA: Diagnosis not present

## 2020-12-26 HISTORY — PX: TOOTH EXTRACTION: SHX859

## 2020-12-26 HISTORY — PX: HARVEST BONE GRAFT: SHX377

## 2020-12-26 HISTORY — DX: Cardiac murmur, unspecified: R01.1

## 2020-12-26 LAB — CREATININE, SERUM
Creatinine, Ser: 1.52 mg/dL — ABNORMAL HIGH (ref 0.44–1.00)
GFR, Estimated: 39 mL/min — ABNORMAL LOW (ref >60)

## 2020-12-26 LAB — CBC
HCT: 41.2 % (ref 36.0–46.0)
HCT: 41.8 % (ref 36.0–46.0)
Hemoglobin: 13 g/dL (ref 12.0–15.0)
Hemoglobin: 13.3 g/dL (ref 12.0–15.0)
MCH: 31.2 pg (ref 26.0–34.0)
MCH: 31.6 pg (ref 26.0–34.0)
MCHC: 31.6 g/dL (ref 30.0–36.0)
MCHC: 31.8 g/dL (ref 30.0–36.0)
MCV: 98.8 fL (ref 80.0–100.0)
MCV: 99.3 fL (ref 80.0–100.0)
Platelets: 185 10*3/uL (ref 150–400)
Platelets: 207 10*3/uL (ref 150–400)
RBC: 4.17 MIL/uL (ref 3.87–5.11)
RBC: 4.21 MIL/uL (ref 3.87–5.11)
RDW: 14.8 % (ref 11.5–15.5)
RDW: 14.8 % (ref 11.5–15.5)
WBC: 5.3 10*3/uL (ref 4.0–10.5)
WBC: 17.3 10*3/uL — ABNORMAL HIGH (ref 4.0–10.5)
nRBC: 0 % (ref 0.0–0.2)
nRBC: 0 % (ref 0.0–0.2)

## 2020-12-26 LAB — BASIC METABOLIC PANEL
Anion gap: 9 (ref 5–15)
BUN: 19 mg/dL (ref 8–23)
CO2: 28 mmol/L (ref 22–32)
Calcium: 9.6 mg/dL (ref 8.9–10.3)
Chloride: 103 mmol/L (ref 98–111)
Creatinine, Ser: 1.31 mg/dL — ABNORMAL HIGH (ref 0.44–1.00)
GFR, Estimated: 46 mL/min — ABNORMAL LOW (ref >60)
Glucose, Bld: 94 mg/dL (ref 70–99)
Potassium: 3.7 mmol/L (ref 3.5–5.1)
Sodium: 140 mmol/L (ref 135–145)

## 2020-12-26 LAB — HIV ANTIBODY (ROUTINE TESTING W REFLEX): HIV Screen 4th Generation wRfx: NONREACTIVE

## 2020-12-26 LAB — PROTIME-INR
INR: 1 (ref 0.8–1.2)
Prothrombin Time: 13.4 seconds (ref 11.4–15.2)

## 2020-12-26 LAB — APTT: aPTT: 37 seconds — ABNORMAL HIGH (ref 24–36)

## 2020-12-26 SURGERY — DENTAL RESTORATION/EXTRACTIONS
Anesthesia: General

## 2020-12-26 MED ORDER — SUFENTANIL CITRATE 50 MCG/ML IV SOLN
INTRAVENOUS | Status: DC | PRN
  Administered 2020-12-26 (×2): 10 ug via INTRAVENOUS
  Administered 2020-12-26: 5 ug via INTRAVENOUS
  Administered 2020-12-26: 10 ug via INTRAVENOUS

## 2020-12-26 MED ORDER — LIDOCAINE HCL (CARDIAC) PF 100 MG/5ML IV SOSY
PREFILLED_SYRINGE | INTRAVENOUS | Status: DC | PRN
Start: 2020-12-26 — End: 2020-12-26
  Administered 2020-12-26: 100 mg via INTRAVENOUS

## 2020-12-26 MED ORDER — IRBESARTAN 150 MG PO TABS
150.0000 mg | ORAL_TABLET | Freq: Every day | ORAL | Status: DC
  Administered 2020-12-26 – 2020-12-27 (×2): 150 mg via ORAL
  Filled 2020-12-26 (×2): qty 1

## 2020-12-26 MED ORDER — ALLOPURINOL 300 MG PO TABS
300.0000 mg | ORAL_TABLET | Freq: Every day | ORAL | Status: DC
  Administered 2020-12-26: 300 mg via ORAL
  Filled 2020-12-26: qty 1

## 2020-12-26 MED ORDER — DEXAMETHASONE SODIUM PHOSPHATE 10 MG/ML IJ SOLN
INTRAMUSCULAR | Status: DC | PRN
  Administered 2020-12-26: 10 mg via INTRAVENOUS

## 2020-12-26 MED ORDER — OXYMETAZOLINE HCL 0.05 % NA SOLN
NASAL | Status: AC
  Filled 2020-12-26: qty 30

## 2020-12-26 MED ORDER — POTASSIUM CHLORIDE CRYS ER 20 MEQ PO TBCR
20.0000 meq | EXTENDED_RELEASE_TABLET | Freq: Every day | ORAL | Status: DC
  Administered 2020-12-26 – 2020-12-27 (×2): 20 meq via ORAL
  Filled 2020-12-26 (×2): qty 1

## 2020-12-26 MED ORDER — LACTATED RINGERS IV SOLN: INTRAVENOUS | Status: DC

## 2020-12-26 MED ORDER — METHOCARBAMOL 500 MG PO TABS: 500.0000 mg | ORAL_TABLET | Freq: Four times a day (QID) | ORAL | Status: DC | PRN

## 2020-12-26 MED ORDER — BUPIVACAINE LIPOSOME 1.3 % IJ SUSP
INTRAMUSCULAR | Status: DC | PRN
  Administered 2020-12-26: 10 mL

## 2020-12-26 MED ORDER — ALBUTEROL SULFATE (2.5 MG/3ML) 0.083% IN NEBU: 2.5000 mg | INHALATION_SOLUTION | Freq: Four times a day (QID) | RESPIRATORY_TRACT | Status: DC | PRN

## 2020-12-26 MED ORDER — ROCURONIUM 10MG/ML (10ML) SYRINGE FOR MEDFUSION PUMP - OPTIME
INTRAVENOUS | Status: DC | PRN
  Administered 2020-12-26: 100 mg via INTRAVENOUS

## 2020-12-26 MED ORDER — PROPOFOL 10 MG/ML IV BOLUS
INTRAVENOUS | Status: DC | PRN
  Administered 2020-12-26: 150 mg via INTRAVENOUS

## 2020-12-26 MED ORDER — PRAVASTATIN SODIUM 40 MG PO TABS
80.0000 mg | ORAL_TABLET | Freq: Every day | ORAL | Status: DC
  Administered 2020-12-27: 80 mg via ORAL
  Filled 2020-12-26: qty 2

## 2020-12-26 MED ORDER — GLYCOPYRROLATE 0.2 MG/ML IJ SOLN
INTRAMUSCULAR | Status: DC | PRN
  Administered 2020-12-26: .2 mg via INTRAVENOUS

## 2020-12-26 MED ORDER — 0.9 % SODIUM CHLORIDE (POUR BTL) OPTIME
TOPICAL | Status: DC | PRN
  Administered 2020-12-26: 1000 mL

## 2020-12-26 MED ORDER — FUROSEMIDE 20 MG PO TABS: 20.0000 mg | ORAL_TABLET | Freq: Every day | ORAL | Status: DC

## 2020-12-26 MED ORDER — ONDANSETRON HCL 4 MG/2ML IJ SOLN: 4.0000 mg | Freq: Four times a day (QID) | INTRAMUSCULAR | Status: DC | PRN

## 2020-12-26 MED ORDER — PROMETHAZINE HCL 25 MG/ML IJ SOLN: 6.2500 mg | INTRAMUSCULAR | Status: DC | PRN

## 2020-12-26 MED ORDER — LACTATED RINGERS IV SOLN: INTRAVENOUS | Status: DC | PRN

## 2020-12-26 MED ORDER — FENTANYL CITRATE (PF) 100 MCG/2ML IJ SOLN
INTRAMUSCULAR | Status: AC
  Filled 2020-12-26: qty 2

## 2020-12-26 MED ORDER — METOPROLOL TARTRATE 50 MG PO TABS
50.0000 mg | ORAL_TABLET | Freq: Every day | ORAL | Status: DC
  Administered 2020-12-27: 50 mg via ORAL
  Filled 2020-12-26: qty 2
  Filled 2020-12-26: qty 1

## 2020-12-26 MED ORDER — SODIUM CHLORIDE 0.9 % IR SOLN
Status: DC | PRN
  Administered 2020-12-26: 1000 mL

## 2020-12-26 MED ORDER — DIPHENHYDRAMINE HCL 12.5 MG/5ML PO ELIX
12.5000 mg | ORAL_SOLUTION | Freq: Four times a day (QID) | ORAL | Status: DC | PRN
  Filled 2020-12-26: qty 5

## 2020-12-26 MED ORDER — MONTELUKAST SODIUM 10 MG PO TABS
10.0000 mg | ORAL_TABLET | Freq: Every day | ORAL | Status: DC
  Administered 2020-12-27: 10 mg via ORAL
  Filled 2020-12-26: qty 1

## 2020-12-26 MED ORDER — BUPIVACAINE LIPOSOME 1.3 % IJ SUSP
INTRAMUSCULAR | Status: AC
  Filled 2020-12-26: qty 20

## 2020-12-26 MED ORDER — LORATADINE 10 MG PO TABS
10.0000 mg | ORAL_TABLET | Freq: Every day | ORAL | Status: DC
  Administered 2020-12-26 – 2020-12-27 (×2): 10 mg via ORAL
  Filled 2020-12-26 (×2): qty 1

## 2020-12-26 MED ORDER — BUPIVACAINE HCL (PF) 0.25 % IJ SOLN
INTRAMUSCULAR | Status: AC
  Filled 2020-12-26: qty 10

## 2020-12-26 MED ORDER — OXYCODONE HCL 5 MG PO TABS
5.0000 mg | ORAL_TABLET | ORAL | Status: DC | PRN
  Administered 2020-12-26 – 2020-12-27 (×2): 5 mg via ORAL
  Filled 2020-12-26 (×2): qty 1

## 2020-12-26 MED ORDER — CHLORHEXIDINE GLUCONATE 0.12 % MT SOLN
15.0000 mL | Freq: Once | OROMUCOSAL | Status: AC
  Administered 2020-12-26: 15 mL via OROMUCOSAL
  Filled 2020-12-26: qty 15

## 2020-12-26 MED ORDER — OXYCODONE HCL 5 MG/5ML PO SOLN: 5.0000 mg | Freq: Once | ORAL | Status: DC | PRN

## 2020-12-26 MED ORDER — FENTANYL CITRATE (PF) 100 MCG/2ML IJ SOLN
25.0000 ug | INTRAMUSCULAR | Status: DC | PRN
  Administered 2020-12-26: 50 ug via INTRAVENOUS
  Administered 2020-12-26 (×2): 25 ug via INTRAVENOUS

## 2020-12-26 MED ORDER — VITAMIN D3 50 MCG (2000 UT) PO TABS: 6000.0000 [IU] | ORAL_TABLET | Freq: Every day | ORAL | Status: DC

## 2020-12-26 MED ORDER — CEFAZOLIN SODIUM-DEXTROSE 2-4 GM/100ML-% IV SOLN
2.0000 g | Freq: Three times a day (TID) | INTRAVENOUS | Status: DC
  Administered 2020-12-26 – 2020-12-27 (×3): 2 g via INTRAVENOUS
  Filled 2020-12-26 (×3): qty 100

## 2020-12-26 MED ORDER — OXYMETAZOLINE HCL 0.05 % NA SOLN
NASAL | Status: DC | PRN
  Administered 2020-12-26 (×3): 2 via NASAL

## 2020-12-26 MED ORDER — SENNOSIDES-DOCUSATE SODIUM 8.6-50 MG PO TABS: 1.0000 | ORAL_TABLET | Freq: Every evening | ORAL | Status: DC | PRN

## 2020-12-26 MED ORDER — ENOXAPARIN SODIUM 30 MG/0.3ML IJ SOSY: 30.0000 mg | PREFILLED_SYRINGE | INTRAMUSCULAR | Status: DC

## 2020-12-26 MED ORDER — FUROSEMIDE 40 MG PO TABS
40.0000 mg | ORAL_TABLET | Freq: Every day | ORAL | Status: DC
  Administered 2020-12-27: 40 mg via ORAL
  Filled 2020-12-26: qty 1

## 2020-12-26 MED ORDER — OXYCODONE HCL 5 MG PO TABS: 5.0000 mg | ORAL_TABLET | Freq: Once | ORAL | Status: DC | PRN

## 2020-12-26 MED ORDER — ACETAMINOPHEN 500 MG PO TABS
1000.0000 mg | ORAL_TABLET | Freq: Four times a day (QID) | ORAL | Status: DC
  Administered 2020-12-26 – 2020-12-27 (×3): 1000 mg via ORAL
  Filled 2020-12-26 (×3): qty 2

## 2020-12-26 MED ORDER — CITALOPRAM HYDROBROMIDE 20 MG PO TABS
20.0000 mg | ORAL_TABLET | Freq: Every day | ORAL | Status: DC
  Administered 2020-12-27: 20 mg via ORAL
  Filled 2020-12-26: qty 1

## 2020-12-26 MED ORDER — SUFENTANIL CITRATE 50 MCG/ML IV SOLN
INTRAVENOUS | Status: AC
  Filled 2020-12-26: qty 1

## 2020-12-26 MED ORDER — ONDANSETRON HCL 4 MG/2ML IJ SOLN
INTRAMUSCULAR | Status: DC | PRN
  Administered 2020-12-26: 4 mg via INTRAVENOUS

## 2020-12-26 MED ORDER — DIPHENHYDRAMINE HCL 50 MG/ML IJ SOLN: 12.5000 mg | Freq: Four times a day (QID) | INTRAMUSCULAR | Status: DC | PRN

## 2020-12-26 MED ORDER — VITAMIN D 25 MCG (1000 UNIT) PO TABS
6000.0000 [IU] | ORAL_TABLET | Freq: Every day | ORAL | Status: DC
  Administered 2020-12-26 – 2020-12-27 (×2): 6000 [IU] via ORAL
  Filled 2020-12-26 (×2): qty 6

## 2020-12-26 MED ORDER — FLUTICASONE PROPIONATE 50 MCG/ACT NA SUSP: 1.0000 | Freq: Every day | NASAL | Status: DC | PRN

## 2020-12-26 MED ORDER — HYDROMORPHONE HCL 1 MG/ML IJ SOLN: 1.0000 mg | INTRAMUSCULAR | Status: DC | PRN

## 2020-12-26 MED ORDER — ENOXAPARIN SODIUM 120 MG/0.8ML IJ SOSY: 120.0000 mg | PREFILLED_SYRINGE | Freq: Two times a day (BID) | INTRAMUSCULAR | Status: DC

## 2020-12-26 MED ORDER — OXYMETAZOLINE HCL 0.05 % NA SOLN
NASAL | Status: DC | PRN
  Administered 2020-12-26: 1

## 2020-12-26 MED ORDER — BUPROPION HCL ER (XL) 300 MG PO TB24
300.0000 mg | ORAL_TABLET | Freq: Every day | ORAL | Status: DC
  Administered 2020-12-27: 300 mg via ORAL
  Filled 2020-12-26: qty 1

## 2020-12-26 MED ORDER — MIDAZOLAM HCL 2 MG/2ML IJ SOLN
INTRAMUSCULAR | Status: DC | PRN
  Administered 2020-12-26: 2 mg via INTRAVENOUS

## 2020-12-26 MED ORDER — FUROSEMIDE 20 MG PO TABS: 20.0000 mg | ORAL_TABLET | ORAL | Status: DC

## 2020-12-26 MED ORDER — SUGAMMADEX SODIUM 200 MG/2ML IV SOLN
INTRAVENOUS | Status: DC | PRN
  Administered 2020-12-26: 200 mg via INTRAVENOUS

## 2020-12-26 MED ORDER — PHENYLEPHRINE HCL-NACL 10-0.9 MG/250ML-% IV SOLN
INTRAVENOUS | Status: DC | PRN
  Administered 2020-12-26: 50 ug/min via INTRAVENOUS

## 2020-12-26 MED ORDER — METOPROLOL TARTRATE 5 MG/5ML IV SOLN
5.0000 mg | Freq: Four times a day (QID) | INTRAVENOUS | Status: DC | PRN
  Filled 2020-12-26: qty 5

## 2020-12-26 MED ORDER — DEXTROSE-NACL 5-0.45 % IV SOLN: INTRAVENOUS | Status: DC

## 2020-12-26 MED ORDER — CEFAZOLIN IN SODIUM CHLORIDE 3-0.9 GM/100ML-% IV SOLN
3.0000 g | INTRAVENOUS | Status: DC
  Filled 2020-12-26: qty 100

## 2020-12-26 MED ORDER — ORAL CARE MOUTH RINSE: 15.0000 mL | Freq: Once | OROMUCOSAL | Status: AC

## 2020-12-26 MED ORDER — ONDANSETRON 4 MG PO TBDP: 4.0000 mg | ORAL_TABLET | Freq: Four times a day (QID) | ORAL | Status: DC | PRN

## 2020-12-26 MED ORDER — LIDOCAINE-EPINEPHRINE 2 %-1:100000 IJ SOLN
INTRAMUSCULAR | Status: AC
  Filled 2020-12-26: qty 1

## 2020-12-26 MED ORDER — LIDOCAINE-EPINEPHRINE 2 %-1:100000 IJ SOLN
INTRAMUSCULAR | Status: DC | PRN
  Administered 2020-12-26: 10 mL via INTRADERMAL

## 2020-12-26 MED ORDER — MIDAZOLAM HCL 2 MG/2ML IJ SOLN
INTRAMUSCULAR | Status: AC
  Filled 2020-12-26: qty 2

## 2020-12-26 MED ORDER — ALBUTEROL SULFATE HFA 108 (90 BASE) MCG/ACT IN AERS: 2.0000 | INHALATION_SPRAY | Freq: Four times a day (QID) | RESPIRATORY_TRACT | Status: DC | PRN

## 2020-12-26 MED ORDER — PROPOFOL 10 MG/ML IV BOLUS
INTRAVENOUS | Status: AC
  Filled 2020-12-26: qty 20

## 2020-12-26 SURGICAL SUPPLY — 51 items
BAG COUNTER SPONGE SURGICOUNT (BAG) IMPLANT
BAG SPNG CNTER NS LX DISP (BAG)
BIT DRILL UPPR FCE 0.9M 4 TWST (BIT) IMPLANT
BIT DRILL UPPR FCE 1.0M 6 TWST (DRILL) IMPLANT
BLADE SURG 15 STRL LF DISP TIS (BLADE) ×2 IMPLANT
BLADE SURG 15 STRL SS (BLADE) ×3
BUR CROSS CUT FISSURE 1.6 (BURR) ×3 IMPLANT
BUR EGG ELITE 4.0 (BURR) ×4 IMPLANT
CANISTER SUCT 3000ML PPV (MISCELLANEOUS) ×3 IMPLANT
COVER SURGICAL LIGHT HANDLE (MISCELLANEOUS) ×3 IMPLANT
DECANTER SPIKE VIAL GLASS SM (MISCELLANEOUS) ×3 IMPLANT
DRAPE U-SHAPE 76X120 STRL (DRAPES) ×3 IMPLANT
DRILL UPPERFACE 0.9M 4MM TWIST (BIT) ×3
DRILL UPPERFACE 1.0M 6MM TWIST (DRILL) ×3
DRSG MEPITEL 3X4 ME34 (GAUZE/BANDAGES/DRESSINGS) ×1 IMPLANT
EVACUATOR 1/8 PVC DRAIN (DRAIN) ×1 IMPLANT
GAUZE PACKING FOLDED 2  STR (GAUZE/BANDAGES/DRESSINGS) ×3
GAUZE PACKING FOLDED 2 STR (GAUZE/BANDAGES/DRESSINGS) ×2 IMPLANT
GLOVE SURG ENC MOIS LTX SZ6.5 (GLOVE) IMPLANT
GLOVE SURG ENC MOIS LTX SZ7 (GLOVE) IMPLANT
GLOVE SURG ENC MOIS LTX SZ8 (GLOVE) ×3 IMPLANT
GLOVE SURG UNDER POLY LF SZ6.5 (GLOVE) IMPLANT
GLOVE SURG UNDER POLY LF SZ7 (GLOVE) IMPLANT
GOWN STRL REUS W/ TWL LRG LVL3 (GOWN DISPOSABLE) ×2 IMPLANT
GOWN STRL REUS W/ TWL XL LVL3 (GOWN DISPOSABLE) ×2 IMPLANT
GOWN STRL REUS W/TWL LRG LVL3 (GOWN DISPOSABLE) ×3
GOWN STRL REUS W/TWL XL LVL3 (GOWN DISPOSABLE) ×3
IV NS 1000ML (IV SOLUTION) ×3
IV NS 1000ML BAXH (IV SOLUTION) ×2 IMPLANT
KIT BASIN OR (CUSTOM PROCEDURE TRAY) ×3 IMPLANT
KIT TURNOVER KIT B (KITS) ×3 IMPLANT
MESH DYNAMIC UNP 90X90X.6M 1.7 (Mesh General) ×1 IMPLANT
NDL HYPO 25GX1X1/2 BEV (NEEDLE) ×4 IMPLANT
NEEDLE HYPO 25GX1X1/2 BEV (NEEDLE) ×6 IMPLANT
NS IRRIG 1000ML POUR BTL (IV SOLUTION) ×3 IMPLANT
PAD ABD 8X10 STRL (GAUZE/BANDAGES/DRESSINGS) ×1 IMPLANT
PAD ARMBOARD 7.5X6 YLW CONV (MISCELLANEOUS) ×3 IMPLANT
SCREW MIDFACE 1.7X4 SLF DRILL (Screw) ×2 IMPLANT
SCREW MIDFACE 1.7X5 SLF DRILL (Screw) ×2 IMPLANT
SCREW UPPERFACE 1.2X4M SLF TAP (Screw) ×1 IMPLANT
SCREW UPPERFACE 1.2X8M SLF TAP (Screw) ×4 IMPLANT
SLEEVE IRRIGATION ELITE 7 (MISCELLANEOUS) ×3 IMPLANT
SPONGE SURGIFOAM ABS GEL 12-7 (HEMOSTASIS) ×1 IMPLANT
SUT CHROMIC 3 0 PS 2 (SUTURE) ×3 IMPLANT
SUT ETHILON 2 0 FS 18 (SUTURE) ×1 IMPLANT
SUT VIC AB 0 CT1 36 (SUTURE) ×1 IMPLANT
SYR BULB IRRIG 60ML STRL (SYRINGE) ×3 IMPLANT
SYR CONTROL 10ML LL (SYRINGE) ×3 IMPLANT
TRAY ENT MC OR (CUSTOM PROCEDURE TRAY) ×3 IMPLANT
TUBING IRRIGATION (MISCELLANEOUS) ×3 IMPLANT
YANKAUER SUCT BULB TIP NO VENT (SUCTIONS) ×3 IMPLANT
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
## 2020-12-26 NOTE — Anesthesia Postprocedure Evaluation (Signed)
Anesthesia Post Note  Patient: [name]  Procedure(s) Performed: DENTAL RESTORATION/EXTRACTIONS HARVEST ILIAC BONE GRAFT (Left)     Patient location during evaluation: PACU Anesthesia Type: General Level of consciousness: awake Pain management: pain level controlled Vital Signs Assessment: post-procedure vital signs reviewed and stable Respiratory status: spontaneous breathing and respiratory function stable Cardiovascular status: stable Postop Assessment: no apparent nausea or vomiting Anesthetic complications: no   No notable events documented.  Last Vitals:  Vitals:   12/26/20 1200 12/26/20 1230  BP: (!) 151/84 (!) 160/82  Pulse: 79 78  Resp: 15 12  Temp: (!) 36.2 C (!) 36.2 C  SpO2: 91% 94%    Last Pain:  Vitals:   12/26/20 1245  TempSrc:   PainSc: 6                  Candra R Placido Hangartner

## 2020-12-26 NOTE — Op Note (Addendum)
Preop diagnosis: Need for bone graft for dental restoration surgery.  Postop diagnosis: Same  Procedure: Left anterior iliac crest bone graft harvest.Cortical cancellous strips and cancellous chips.   Surgeon: Lorin Mercy MD  Anesthesia General.  Exparel 20 cc Marcaine 10 cc.  Local.  EBL 100 cc  Procedure: Patient is having surgery by Dr. Diona Browner and he requested me to obtain cortical cancellous graft needed for buildup of the mandible or maxilla in addition to extraction work he was doing.  He had requested cortical cancellous bone needed for the surgery.  After timeout procedure the area been squared with towels a Betadine Steri-Drape had been applied.  Incision was made starting 2cm lateral to the anterior superior iliac crest.  Patient had a higher BMI and thick adipose layer was divided in line with the crest 1 cm distal.  Fascia was meticulously cleaned off with a Cobb and then divided at its attachment along the iliac crest.  Taylor retractor was placed and all stones were used to take cortical cancellous strips 2 to 3 cm in length 1 cm wide and then additional harvest of cancellous bone using curette was obtained.  I checked with Dr. Hoyt Koch he stated he had adequate bone.  Piece of Gelfoam was placed briefly after irrigation.  Sponges were used.  Second look carefully to make sure any areas that could be coagulated to decrease bleeding were performed since patient has atrial fibrillation and is on chronic Coumadin.  Fascia was meticulously closed after Hemovac drain was placed within the technique.  0 Vicryl in the fascia.  Drain was not caught up in the suture and could be extracted.  Subcutaneous tissue reapproximated 2-0 Vicryl.  2-0 nylon interrupted skin closure was performed.  Mepilex 4 x 4's ABD was applied.  Just prior to closure Exparel and Marcaine was injected for postoperative analgesia.

## 2020-12-26 NOTE — Anesthesia Procedure Notes (Addendum)
Procedure Name: Intubation Date/Time: 12/26/2020 7:37 AM Performed by: Claris Che, CRNA Pre-anesthesia Checklist: Patient identified, Emergency Drugs available, Suction available, Patient being monitored and Timeout performed Patient Re-evaluated:Patient Re-evaluated prior to induction Oxygen Delivery Method: Circle system utilized Preoxygenation: Pre-oxygenation with 100% oxygen Induction Type: IV induction and Cricoid Pressure applied Ventilation: Mask ventilation without difficulty Laryngoscope Size: Mac and 4 Grade View: Grade II Nasal Tubes: Right, Nasal prep performed and Nasal Rae Tube size: 7.0 mm Number of attempts: 1 Placement Confirmation: ETT inserted through vocal cords under direct vision, positive ETCO2 and breath sounds checked- equal and bilateral Tube secured with: Tape Dental Injury: Teeth and Oropharynx as per pre-operative assessment

## 2020-12-26 NOTE — H&P (Signed)
H&P documentation  -History and Physical Reviewed  -Patient has been re-examined  -No change in the plan of care  [name]  

## 2020-12-26 NOTE — Transfer of Care (Signed)
Immediate Anesthesia Transfer of Care Note  Patient: [name]  Procedure(s) Performed: DENTAL RESTORATION/EXTRACTIONS HARVEST ILIAC BONE GRAFT (Left)  Patient Location: PACU  Anesthesia Type:General  Level of Consciousness: oriented, drowsy, patient cooperative and responds to stimulation  Airway & Oxygen Therapy: Patient Spontanous Breathing and Patient connected to face mask oxygen  Post-op Assessment: Report given to RN, Post -op Vital signs reviewed and stable and Patient moving all extremities X 4  Post vital signs: Reviewed and stable  Last Vitals:  Vitals Value Taken Time  BP 117/58 12/26/20 1009  Temp    Pulse 81 12/26/20 1013  Resp 15 12/26/20 1013  SpO2 88 % 12/26/20 1013  Vitals shown include unvalidated device data.  Last Pain:  Vitals:   12/26/20 0642  TempSrc:   PainSc: 0-No pain         Complications: No notable events documented.

## 2020-12-26 NOTE — Op Note (Signed)
12/26/2020  9:51 AM  PATIENT:  [name]  60 y.o. female  PRE-OPERATIVE DIAGNOSIS:  Maxillary atrophy, fibroma left buccal mucosa, Impacted tooth #17, non-restorable teeth # 23,24, 25, 26, 27, secondary to DENTAL CARIES and periodonrtitis  POST-OPERATIVE DIAGNOSIS:  SAME  PROCEDURE:  Procedure(s): Maxillary alveolar ridge reconstruction with iliac crest bone graft, EXTRACTION teeth # 23,24, 25, 26, 27, removal left buccal mucosa fibroma HARVEST ILIAC BONE GRAFT  SURGEON:  Surgeon(s): Diona Browner, DMD Marybelle Killings, MD  ANESTHESIA:   local and general  EBL:  minimal  DRAINS: none   SPECIMEN:  left buccal mucosa fibroma  COUNTS:  YES  PLAN OF CARE Admit for 23h observation  PATIENT DISPOSITION:  PACU - hemodynamically stable.   PROCEDURE DETAILS: Dictation # [id number]  Gae Bon, DMD 12/26/2020 9:51 AM

## 2020-12-26 NOTE — Op Note (Signed)
NAME: [name], [name] MEDICAL RECORD NO: [id number] ACCOUNT NO: [id number] DATE OF BIRTH: [birth year] FACILITY: [facility] LOCATION: [facility] PHYSICIAN: Gae Bon, DDS  Operative Report   DATE OF PROCEDURE: 12/26/2020  PREOPERATIVE DIAGNOSES:  Maxillary atrophy, fibroma left buccal mucosa, impacted tooth #17, nonrestorable teeth #22, 23, 24, 25, 26, 27 secondary to dental caries and periodontitis.  POSTOPERATIVE DIAGNOSES:  Maxillary atrophy, fibroma left buccal mucosa, impacted tooth #17, nonrestorable teeth #22, 23, 24, 25, 26, 27 secondary to dental caries and periodontitis.  PROCEDURE: Maxillary ridge reconstruction with iliac crest bone graft, extraction teeth #23, 24, 25, 26, 27 and removal of left buccal mucosal fibroma.  SURGEON:  Diona Browner, DDS  ANESTHESIA:  General, nasal intubation.  Attending was Dr. Elgie Congo.  DESCRIPTION OF PROCEDURE:  The patient was taken to the operating room and placed on the table in the supine position.  General anesthesia was administered intravenously and nasal endotracheal tube was placed and secured.  The eyes were protected.  The  oral cavity was then draped.  Dr. Rodell Perna was working on the iliac crest bone graft harvest, at the same time that Dr. Hoyt Koch was doing the oral surgery.  The fields were kept sterile by the drapes.  The timeout was performed.  The oral cavity was  then suctioned.  A throat pack was placed, 2% lidocaine and 1:100,000 epinephrine was infiltrated in an inferior alveolar block on the right and left sides and buccal and palatal infiltration in the maxilla.  The maxilla was operated first.  A 15 blade  was used to make an incision beginning at the retromolar region of the left maxilla and carried forward along the alveolar crest to the midline.  The periosteum was reflected to expose the bone. In the posterior region of the maxilla, there was very  little alveolar ridge remaining and the zygomatic buttress and floor of the  sinus bone was easily identified. In the anterior part of the maxilla, there was a knife-edged maxillary ridge that was approximately 5 mm from the base of the bone.  The  periosteum was then released with a 15 blade to allow for tissue closure over the graft.  After the corticocancellous strip of bone was harvested by Dr. Lorin Mercy, it was passed onto the oral surgery table.  This graft was then trimmed and mobilized to fit  upon the alveolar posterior ridge forward towards the premolar area.  Bone screws, 1.7 x2 were placed to secure the bone graft to the edentulous maxilla.  Then, the incision was carried across the midline to the right retromolar region and a similar  dissection was made.  Bone findings were similar to that on the left side.  The periosteum was released to allow for expansion of the tissue to cover the bone graft.  A second corticocancellous strip was passed onto the field from the hip and then this  was adapted to the right posterior maxillary bone and secured to the underlying bone with 1.7 mm screws x2.  Then, a titanium mesh was molded to fit in the anterior maxilla and trimmed so that bone could be placed underneath it, adding bulk and depth to  the anterior maxilla alveolar ridge. Bone chips of cancellous bone were placed into the anterior maxilla and secured.  They were compressed to the maxilla and then secured with a titanium mesh using bone screws.  Then, additional bone was placed in the  gap between the mesh and the posterior bone strips.  Then, the  area was sutured with continuous horizontal mattress using 3-0 Prolene with horizontal mattress sutures and then the area was turned to the left mandible.  Tooth #17 was impacted and required  removal.  A 15 blade was used to make an incision overlying the mucosa and carried forward to tooth #18.  The periosteum was reflected.  Bone was removed using a Stryker handpiece under irrigation. The tooth was elevated, sectioned and removed  from the  mouth and the socket was curetted, irrigated and closed with 3-0 chromic.  A 15 blade was then used to make an incision buccally and lingually in the gingival sulcus from teeth #23, 24, 25, 26 and 27.  The periosteum was reflected.  The teeth were  elevated with a 301 elevator and removed from the mouth with Ash forceps.  The sockets were curetted.  Additional bone harvested from the hip was placed into the sockets to help maintain adequate bone height in this region.  The area was closed with 3-0  chromic.  Then, additional local anesthesia was administered in the left buccal mucosa around the lesion, which was presumed to be a fibroma.  It was approximately 1 x 1 cm sessile lesion.  The 15 blade was used to make an envelope incision around the  lesion and the lesion was removed.  Then, the area was sutured with 3-0 chromic interrupted sutures.  The oral cavity was then examined, irrigated and suctioned.  There was good closure, hemostasis.  The throat pack was removed.  The patient was then  left under the care of anesthesia for transport to recovery with plans for 23-hour observation.  EBL from oral surgery was minimum.  No drains.  Specimen was left buccal mucosa fibroma.  Counts were correct.   SHW D: 12/26/2020 10:02:16 am T: 12/26/2020 11:02:00 am  JOB: [id number]/ [id number]

## 2020-12-27 LAB — CBC
HCT: 35.5 % — ABNORMAL LOW (ref 36.0–46.0)
Hemoglobin: 11.3 g/dL — ABNORMAL LOW (ref 12.0–15.0)
MCH: 31.9 pg (ref 26.0–34.0)
MCHC: 31.8 g/dL (ref 30.0–36.0)
MCV: 100.3 fL — ABNORMAL HIGH (ref 80.0–100.0)
Platelets: 190 10*3/uL (ref 150–400)
RBC: 3.54 MIL/uL — ABNORMAL LOW (ref 3.87–5.11)
RDW: 14.6 % (ref 11.5–15.5)
WBC: 14.1 10*3/uL — ABNORMAL HIGH (ref 4.0–10.5)
nRBC: 0 % (ref 0.0–0.2)

## 2020-12-27 LAB — PROTIME-INR
INR: 1 (ref 0.8–1.2)
Prothrombin Time: 13.5 seconds (ref 11.4–15.2)

## 2020-12-27 MED ORDER — CHLORHEXIDINE GLUCONATE 0.12 % MT SOLN: 15.0000 mL | Freq: Two times a day (BID) | OROMUCOSAL | 0 refills | Status: DC

## 2020-12-27 MED ORDER — WARFARIN SODIUM 2.5 MG PO TABS: 3.7500 mg | ORAL_TABLET | Freq: Once | ORAL | Status: DC

## 2020-12-27 MED ORDER — WARFARIN - PHARMACIST DOSING INPATIENT: Freq: Every day | Status: DC

## 2020-12-27 MED ORDER — OXYCODONE HCL 5 MG PO TABS: 5.0000 mg | ORAL_TABLET | ORAL | 0 refills | Status: DC | PRN

## 2020-12-27 MED ORDER — AMOXICILLIN 500 MG PO CAPS: 500.0000 mg | ORAL_CAPSULE | Freq: Three times a day (TID) | ORAL | 0 refills | Status: DC

## 2020-12-27 NOTE — Progress Notes (Signed)
Patient alert and oriented, voiding adequately, skin clean, dry and intact without evidence of skin break down, or symptoms of complications - no redness or edema noted, only slight tenderness at site.  Patient states pain is manageable at time of discharge. Patient has an appointment with MD in 2 weeks 

## 2020-12-27 NOTE — Discharge Summary (Signed)
Patient ID: MEEYA GOLDIN MRN: [id number] DOB/AGE: [birth year] 61 y.o.  Admit date: 12/26/2020 Discharge date: 12/27/2020  Admission Diagnoses:  Discharge Diagnoses:  Active Problems:   Post-operative state   Discharged Condition: good  Hospital Course: Pt admitted for 23h obs s/p Oral surgery procedure with Maxillary bone graft from iliac crest harvest.  Consults: None  Significant Diagnostic Studies: labs: CBC, Chem 21, INR  Treatments: IV hydration and antibiotics: IV fluids, Ancef  Discharge Exam: Blood pressure (!) 118/58, pulse 70, temperature 98.2 F (36.8 C), temperature source Oral, resp. rate 20, height 5' 6.25" (1.683 m), weight 132.5 kg, SpO2 98 %. General appearance: alert, cooperative, and morbidly obese Head: Normocephalic, without obvious abnormality, atraumatic Eyes: negative Nose: Nares normal. Septum midline. Mucosa normal. No drainage or sinus tenderness. Throat: Bilateral intraoral buccal ecchymosis, hemostatic, sutures intact. Minimal edema. No trismus. Neck: no adenopathy and Bilateral cheek and submental ecchymosis, mild edema Resp: clear to auscultation bilaterally Cardio: regular rate and rhythm, S1, S2 normal, no murmur, click, rub or gallop  Disposition: D/C to home.  Hip drain removed. Less than 5 cc blood. Begin Lovenox to Coumadin transition per pharmacy. Pt to follow up in my office as scheduled.       Signed: Diona Browner 12/27/2020, 7:35 AM

## 2020-12-27 NOTE — Progress Notes (Signed)
ANTICOAGULATION CONSULT NOTE - Follow Up Consult  Pharmacy Consult for warfarin with Lovenox bridge Indication: Mechanical Valve  No Known Allergies  Patient Measurements: Height: 5' 6.25" (168.3 cm) Weight: 132.5 kg (292 lb) IBW/kg (Calculated) : 59.88   Vital Signs: Temp: 98.3 F (36.8 C) (07/30 0744) Temp Source: Oral (07/30 0744) BP: 109/61 (07/30 0744) Pulse Rate: 69 (07/30 0744)  Labs: Recent Labs    12/26/20 0601 12/26/20 1432 12/27/20 0731 12/27/20 0743  HGB 13.0 13.3 11.3*  --   HCT 41.2 41.8 35.5*  --   PLT 185 207 190  --   APTT 37*  --   --   --   LABPROT 13.4  --   --  13.5  INR 1.0  --   --  1.0  CREATININE 1.31* 1.52*  --   --     Estimated Creatinine Clearance: 54.5 mL/min (A) (by C-G formula based on SCr of 1.52 mg/dL (H)).   Medications:  Medications Prior to Admission  Medication Sig Dispense Refill Last Dose   acetaminophen (TYLENOL) 650 MG CR tablet Take 1,300 mg by mouth 2 (two) times daily.   12/26/2020 at 0400   albuterol (PROAIR HFA) 108 (90 Base) MCG/ACT inhaler Inhale 2 puffs into the lungs every 6 (six) hours as needed for wheezing or shortness of breath. 18 g 5 12/26/2020 at 0400   allopurinol (ZYLOPRIM) 300 MG tablet Take 300 mg by mouth at bedtime.    12/25/2020   buPROPion (WELLBUTRIN XL) 300 MG 24 hr tablet Take 300 mg by mouth daily.    12/26/2020 at 0400   cetirizine (ZYRTEC) 10 MG tablet Take 10 mg by mouth at bedtime.   12/25/2020   Cholecalciferol (VITAMIN D3) 2000 UNITS TABS Take 6,000 Units by mouth daily.   12/25/2020   citalopram (CELEXA) 20 MG tablet Take 20 mg by mouth daily.    12/26/2020 at 0400   enoxaparin (LOVENOX) 120 MG/0.8ML injection Inject 0.8 mLs (120 mg total) into the skin every 12 (twelve) hours. 16 mL 1 12/25/2020 at 0700   fluticasone (FLONASE) 50 MCG/ACT nasal spray Place 1 spray into both nostrils daily as needed for allergies.   12/26/2020 at 0400   furosemide (LASIX) 40 MG tablet TAKE 2 TABLETS BY MOUTH IN THE  AM AND 1 TABLET IN THE PM. (Patient taking differently: Take 20-40 mg by mouth See admin instructions. Take 40 mg in the morning 20 mg in the afternood) 270 tablet 3 12/25/2020   irbesartan (AVAPRO) 150 MG tablet Take 1 tablet (150 mg total) by mouth daily. 90 tablet 1 12/25/2020   KLOR-CON M20 20 MEQ tablet TAKE 1 TABLET BY MOUTH EVERY DAY (Patient taking differently: Take 20 mEq by mouth daily.) 90 tablet 3 12/25/2020   metoprolol tartrate (LOPRESSOR) 50 MG tablet Take 0.5 tablets (25 mg total) by mouth 2 (two) times daily. (Patient taking differently: Take 50 mg by mouth daily.) 30 tablet 0 12/26/2020 at 0400   montelukast (SINGULAIR) 10 MG tablet Take 10 mg by mouth daily.    12/26/2020 at 0400   pravastatin (PRAVACHOL) 80 MG tablet Take 80 mg by mouth daily.    12/26/2020 at 0400   warfarin (COUMADIN) 7.5 MG tablet TAKE 1/2 TO 1 TABLET BY MOUTH DAILY OR AS DIRECTED BY COUMADIN CLINIC (Patient taking differently: Take 7.5 mg by mouth See admin instructions. Take 7.5 mg on Mon. Wed., and Friday Take 3.75 mg all the other day  OR AS DIRECTED BY COUMADIN CLINIC)  90 tablet 1 Past Week   albuterol (PROVENTIL) (2.5 MG/3ML) 0.083% nebulizer solution Take 2.5 mg by nebulization every 6 (six) hours as needed for wheezing or shortness of breath.   More than a month   amoxicillin (AMOXIL) 500 MG capsule Take 4 capsules by mouth 30-60 min prior to dental work (Patient taking differently: Take 2,000 mg by mouth once. 30-60 min prior to dental work) 4 capsule 0    OXYGEN Inhale 2.5 L into the lungs daily as needed (SOB).   More than a month    Assessment: 89 yoF with h/o mechanical valve on warfarin PTA which was held and transitioned to lovenox for oral surgery. S/p surgery and drain removal. Last lovenox dose on 7/28. Pharmacy consulted to resume anticoagulation after drain removal today.   Goal of Therapy:  INR 2.5-3.5 Monitor platelets by anticoagulation protocol: Yes   Plan:  Lovenox 120 mg SQ q12h  bridging to warfarin 3.75 mg x1 dose today. Plan will be to resume home warfarin regimen as pt was stable and therapeutic on admission.   Willette Cluster, PharmD PGY2 Infectious Diseases Pharmacy Resident 12/27/2020,9:12 AM

## 2020-12-27 NOTE — Plan of Care (Signed)
Adequately ready for Discharge

## 2020-12-29 ENCOUNTER — Encounter (HOSPITAL_COMMUNITY): Payer: Self-pay | Admitting: Oral Surgery

## 2020-12-29 LAB — SURGICAL PATHOLOGY

## 2020-12-30 ENCOUNTER — Inpatient Hospital Stay (HOSPITAL_COMMUNITY)
Admission: EM | Admit: 2020-12-30 | Discharge: 2021-01-09 | DRG: 813 | Disposition: A | Discharge status: Home Health | Payer: Medicare Other | Attending: Internal Medicine | Admitting: Internal Medicine

## 2020-12-30 ENCOUNTER — Other Ambulatory Visit: Payer: Self-pay

## 2020-12-30 ENCOUNTER — Emergency Department (HOSPITAL_COMMUNITY): Payer: Medicare Other

## 2020-12-30 DIAGNOSIS — J9621 Acute and chronic respiratory failure with hypoxia: Secondary | ICD-10-CM | POA: Diagnosis not present

## 2020-12-30 DIAGNOSIS — L7632 Postprocedural hematoma of skin and subcutaneous tissue following other procedure: Secondary | ICD-10-CM | POA: Diagnosis present

## 2020-12-30 DIAGNOSIS — R221 Localized swelling, mass and lump, neck: Secondary | ICD-10-CM | POA: Diagnosis not present

## 2020-12-30 DIAGNOSIS — J8403 Idiopathic pulmonary hemosiderosis: Secondary | ICD-10-CM | POA: Diagnosis present

## 2020-12-30 DIAGNOSIS — E781 Pure hyperglyceridemia: Secondary | ICD-10-CM | POA: Diagnosis present

## 2020-12-30 DIAGNOSIS — Z9884 Bariatric surgery status: Secondary | ICD-10-CM

## 2020-12-30 DIAGNOSIS — R3912 Poor urinary stream: Secondary | ICD-10-CM | POA: Diagnosis not present

## 2020-12-30 DIAGNOSIS — D72828 Other elevated white blood cell count: Secondary | ICD-10-CM | POA: Diagnosis present

## 2020-12-30 DIAGNOSIS — I5189 Other ill-defined heart diseases: Secondary | ICD-10-CM | POA: Diagnosis not present

## 2020-12-30 DIAGNOSIS — Z8249 Family history of ischemic heart disease and other diseases of the circulatory system: Secondary | ICD-10-CM

## 2020-12-30 DIAGNOSIS — I4821 Permanent atrial fibrillation: Secondary | ICD-10-CM | POA: Diagnosis present

## 2020-12-30 DIAGNOSIS — R578 Other shock: Secondary | ICD-10-CM | POA: Diagnosis not present

## 2020-12-30 DIAGNOSIS — N1831 Chronic kidney disease, stage 3a: Secondary | ICD-10-CM | POA: Diagnosis present

## 2020-12-30 DIAGNOSIS — S301XXA Contusion of abdominal wall, initial encounter: Secondary | ICD-10-CM | POA: Diagnosis present

## 2020-12-30 DIAGNOSIS — Z9189 Other specified personal risk factors, not elsewhere classified: Secondary | ICD-10-CM | POA: Diagnosis not present

## 2020-12-30 DIAGNOSIS — Z87891 Personal history of nicotine dependence: Secondary | ICD-10-CM

## 2020-12-30 DIAGNOSIS — N170 Acute kidney failure with tubular necrosis: Secondary | ICD-10-CM | POA: Diagnosis present

## 2020-12-30 DIAGNOSIS — R0902 Hypoxemia: Secondary | ICD-10-CM | POA: Diagnosis not present

## 2020-12-30 DIAGNOSIS — I5032 Chronic diastolic (congestive) heart failure: Secondary | ICD-10-CM | POA: Diagnosis present

## 2020-12-30 DIAGNOSIS — Z79899 Other long term (current) drug therapy: Secondary | ICD-10-CM

## 2020-12-30 DIAGNOSIS — Z6841 Body Mass Index (BMI) 40.0 and over, adult: Secondary | ICD-10-CM

## 2020-12-30 DIAGNOSIS — E559 Vitamin D deficiency, unspecified: Secondary | ICD-10-CM | POA: Diagnosis present

## 2020-12-30 DIAGNOSIS — E785 Hyperlipidemia, unspecified: Secondary | ICD-10-CM | POA: Diagnosis present

## 2020-12-30 DIAGNOSIS — E871 Hypo-osmolality and hyponatremia: Secondary | ICD-10-CM | POA: Diagnosis present

## 2020-12-30 DIAGNOSIS — R339 Retention of urine, unspecified: Secondary | ICD-10-CM | POA: Diagnosis present

## 2020-12-30 DIAGNOSIS — I6523 Occlusion and stenosis of bilateral carotid arteries: Secondary | ICD-10-CM | POA: Diagnosis not present

## 2020-12-30 DIAGNOSIS — M25559 Pain in unspecified hip: Secondary | ICD-10-CM | POA: Diagnosis not present

## 2020-12-30 DIAGNOSIS — M109 Gout, unspecified: Secondary | ICD-10-CM | POA: Diagnosis present

## 2020-12-30 DIAGNOSIS — R571 Hypovolemic shock: Secondary | ICD-10-CM | POA: Diagnosis not present

## 2020-12-30 DIAGNOSIS — T8119XA Other postprocedural shock, initial encounter: Secondary | ICD-10-CM

## 2020-12-30 DIAGNOSIS — I4892 Unspecified atrial flutter: Secondary | ICD-10-CM | POA: Diagnosis present

## 2020-12-30 DIAGNOSIS — Z7901 Long term (current) use of anticoagulants: Secondary | ICD-10-CM

## 2020-12-30 DIAGNOSIS — D6832 Hemorrhagic disorder due to extrinsic circulating anticoagulants: Principal | ICD-10-CM | POA: Diagnosis present

## 2020-12-30 DIAGNOSIS — K8020 Calculus of gallbladder without cholecystitis without obstruction: Secondary | ICD-10-CM | POA: Diagnosis not present

## 2020-12-30 DIAGNOSIS — Z952 Presence of prosthetic heart valve: Secondary | ICD-10-CM | POA: Diagnosis not present

## 2020-12-30 DIAGNOSIS — Z833 Family history of diabetes mellitus: Secondary | ICD-10-CM | POA: Diagnosis not present

## 2020-12-30 DIAGNOSIS — E875 Hyperkalemia: Secondary | ICD-10-CM | POA: Diagnosis not present

## 2020-12-30 DIAGNOSIS — Y848 Other medical procedures as the cause of abnormal reaction of the patient, or of later complication, without mention of misadventure at the time of the procedure: Secondary | ICD-10-CM | POA: Diagnosis present

## 2020-12-30 DIAGNOSIS — D6489 Other specified anemias: Secondary | ICD-10-CM | POA: Diagnosis not present

## 2020-12-30 DIAGNOSIS — D62 Acute posthemorrhagic anemia: Secondary | ICD-10-CM | POA: Diagnosis present

## 2020-12-30 DIAGNOSIS — T8201XA Breakdown (mechanical) of heart valve prosthesis, initial encounter: Secondary | ICD-10-CM | POA: Diagnosis not present

## 2020-12-30 DIAGNOSIS — Z20822 Contact with and (suspected) exposure to covid-19: Secondary | ICD-10-CM | POA: Diagnosis not present

## 2020-12-30 DIAGNOSIS — I130 Hypertensive heart and chronic kidney disease with heart failure and stage 1 through stage 4 chronic kidney disease, or unspecified chronic kidney disease: Secondary | ICD-10-CM | POA: Diagnosis present

## 2020-12-30 DIAGNOSIS — R9431 Abnormal electrocardiogram [ECG] [EKG]: Secondary | ICD-10-CM | POA: Diagnosis not present

## 2020-12-30 DIAGNOSIS — N189 Chronic kidney disease, unspecified: Secondary | ICD-10-CM | POA: Diagnosis not present

## 2020-12-30 DIAGNOSIS — I890 Lymphedema, not elsewhere classified: Secondary | ICD-10-CM | POA: Diagnosis present

## 2020-12-30 DIAGNOSIS — D649 Anemia, unspecified: Secondary | ICD-10-CM | POA: Diagnosis not present

## 2020-12-30 DIAGNOSIS — Z954 Presence of other heart-valve replacement: Secondary | ICD-10-CM

## 2020-12-30 DIAGNOSIS — Z9981 Dependence on supplemental oxygen: Secondary | ICD-10-CM | POA: Diagnosis not present

## 2020-12-30 DIAGNOSIS — K449 Diaphragmatic hernia without obstruction or gangrene: Secondary | ICD-10-CM | POA: Diagnosis not present

## 2020-12-30 DIAGNOSIS — I517 Cardiomegaly: Secondary | ICD-10-CM | POA: Diagnosis not present

## 2020-12-30 DIAGNOSIS — N179 Acute kidney failure, unspecified: Secondary | ICD-10-CM | POA: Diagnosis not present

## 2020-12-30 DIAGNOSIS — R34 Anuria and oliguria: Secondary | ICD-10-CM

## 2020-12-30 DIAGNOSIS — I480 Paroxysmal atrial fibrillation: Secondary | ICD-10-CM | POA: Diagnosis not present

## 2020-12-30 DIAGNOSIS — I509 Heart failure, unspecified: Secondary | ICD-10-CM | POA: Diagnosis not present

## 2020-12-30 DIAGNOSIS — M7981 Nontraumatic hematoma of soft tissue: Secondary | ICD-10-CM | POA: Diagnosis not present

## 2020-12-30 DIAGNOSIS — D6959 Other secondary thrombocytopenia: Secondary | ICD-10-CM | POA: Diagnosis not present

## 2020-12-30 LAB — PROTIME-INR
INR: 1.4 — ABNORMAL HIGH (ref 0.8–1.2)
INR: 1.4 — ABNORMAL HIGH (ref 0.8–1.2)
Prothrombin Time: 16.9 seconds — ABNORMAL HIGH (ref 11.4–15.2)
Prothrombin Time: 17.4 seconds — ABNORMAL HIGH (ref 11.4–15.2)

## 2020-12-30 LAB — I-STAT CHEM 8, ED
BUN: 33 mg/dL — ABNORMAL HIGH (ref 8–23)
Calcium, Ion: 1.14 mmol/L — ABNORMAL LOW (ref 1.15–1.40)
Chloride: 101 mmol/L (ref 98–111)
Creatinine, Ser: 1.9 mg/dL — ABNORMAL HIGH (ref 0.44–1.00)
Glucose, Bld: 158 mg/dL — ABNORMAL HIGH (ref 70–99)
HCT: 23 % — ABNORMAL LOW (ref 36.0–46.0)
Hemoglobin: 7.8 g/dL — ABNORMAL LOW (ref 12.0–15.0)
Potassium: 4.6 mmol/L (ref 3.5–5.1)
Sodium: 136 mmol/L (ref 135–145)
TCO2: 24 mmol/L (ref 22–32)

## 2020-12-30 LAB — CBC WITH DIFFERENTIAL/PLATELET
Abs Immature Granulocytes: 0.23 10*3/uL — ABNORMAL HIGH (ref 0.00–0.07)
Basophils Absolute: 0 10*3/uL (ref 0.0–0.1)
Basophils Relative: 0 %
Eosinophils Absolute: 0 10*3/uL (ref 0.0–0.5)
Eosinophils Relative: 0 %
HCT: 20.9 % — ABNORMAL LOW (ref 36.0–46.0)
Hemoglobin: 6.3 g/dL — CL (ref 12.0–15.0)
Immature Granulocytes: 1 %
Lymphocytes Relative: 9 %
Lymphs Abs: 1.6 10*3/uL (ref 0.7–4.0)
MCH: 31.3 pg (ref 26.0–34.0)
MCHC: 30.1 g/dL (ref 30.0–36.0)
MCV: 104 fL — ABNORMAL HIGH (ref 80.0–100.0)
Monocytes Absolute: 1.9 10*3/uL — ABNORMAL HIGH (ref 0.1–1.0)
Monocytes Relative: 11 %
Neutro Abs: 13.5 10*3/uL — ABNORMAL HIGH (ref 1.7–7.7)
Neutrophils Relative %: 79 %
Platelets: 261 10*3/uL (ref 150–400)
RBC: 2.01 MIL/uL — ABNORMAL LOW (ref 3.87–5.11)
RDW: 15.4 % (ref 11.5–15.5)
WBC: 17.2 10*3/uL — ABNORMAL HIGH (ref 4.0–10.5)
nRBC: 0.6 % — ABNORMAL HIGH (ref 0.0–0.2)

## 2020-12-30 LAB — BASIC METABOLIC PANEL
Anion gap: 11 (ref 5–15)
BUN: 36 mg/dL — ABNORMAL HIGH (ref 8–23)
CO2: 23 mmol/L (ref 22–32)
Calcium: 9.1 mg/dL (ref 8.9–10.3)
Chloride: 101 mmol/L (ref 98–111)
Creatinine, Ser: 1.85 mg/dL — ABNORMAL HIGH (ref 0.44–1.00)
GFR, Estimated: 31 mL/min — ABNORMAL LOW (ref >60)
Glucose, Bld: 153 mg/dL — ABNORMAL HIGH (ref 70–99)
Potassium: 4.7 mmol/L (ref 3.5–5.1)
Sodium: 135 mmol/L (ref 135–145)

## 2020-12-30 LAB — CBC
HCT: 24.4 % — ABNORMAL LOW (ref 36.0–46.0)
HCT: 23.9 % — ABNORMAL LOW (ref 36.0–46.0)
Hemoglobin: 7.7 g/dL — ABNORMAL LOW (ref 12.0–15.0)
Hemoglobin: 7.7 g/dL — ABNORMAL LOW (ref 12.0–15.0)
MCH: 30.7 pg (ref 26.0–34.0)
MCH: 31.6 pg (ref 26.0–34.0)
MCHC: 31.6 g/dL (ref 30.0–36.0)
MCHC: 32.2 g/dL (ref 30.0–36.0)
MCV: 97.2 fL (ref 80.0–100.0)
MCV: 98 fL (ref 80.0–100.0)
Platelets: 207 10*3/uL (ref 150–400)
Platelets: 227 10*3/uL (ref 150–400)
RBC: 2.51 MIL/uL — ABNORMAL LOW (ref 3.87–5.11)
RBC: 2.44 MIL/uL — ABNORMAL LOW (ref 3.87–5.11)
RDW: 17.1 % — ABNORMAL HIGH (ref 11.5–15.5)
RDW: 17.2 % — ABNORMAL HIGH (ref 11.5–15.5)
WBC: 15.1 10*3/uL — ABNORMAL HIGH (ref 4.0–10.5)
WBC: 17.7 10*3/uL — ABNORMAL HIGH (ref 4.0–10.5)
nRBC: 1.3 % — ABNORMAL HIGH (ref 0.0–0.2)
nRBC: 2.1 % — ABNORMAL HIGH (ref 0.0–0.2)

## 2020-12-30 LAB — RESP PANEL BY RT-PCR (FLU A&B, COVID) ARPGX2
Influenza A by PCR: NEGATIVE
Influenza B by PCR: NEGATIVE
SARS Coronavirus 2 by RT PCR: NEGATIVE

## 2020-12-30 LAB — PREPARE RBC (CROSSMATCH)

## 2020-12-30 MED ORDER — HYDROMORPHONE HCL 1 MG/ML IJ SOLN
0.5000 mg | INTRAMUSCULAR | Status: DC | PRN
  Administered 2020-12-30: 0.5 mg via INTRAVENOUS
  Filled 2020-12-30: qty 1

## 2020-12-30 MED ORDER — SODIUM CHLORIDE 0.9% FLUSH
3.0000 mL | Freq: Two times a day (BID) | INTRAVENOUS | Status: DC
  Administered 2020-12-30 – 2021-01-09 (×20): 3 mL via INTRAVENOUS

## 2020-12-30 MED ORDER — HYDROMORPHONE HCL 1 MG/ML IJ SOLN
0.5000 mg | INTRAMUSCULAR | Status: DC | PRN
  Administered 2020-12-30 – 2021-01-02 (×11): 0.5 mg via INTRAVENOUS
  Filled 2020-12-30 (×11): qty 1

## 2020-12-30 MED ORDER — ONDANSETRON HCL 4 MG/2ML IJ SOLN
4.0000 mg | Freq: Four times a day (QID) | INTRAMUSCULAR | Status: DC | PRN
  Administered 2020-12-30 – 2020-12-31 (×2): 4 mg via INTRAVENOUS
  Filled 2020-12-30 (×2): qty 2

## 2020-12-30 MED ORDER — ONDANSETRON HCL 4 MG/2ML IJ SOLN
4.0000 mg | Freq: Once | INTRAMUSCULAR | Status: AC
  Administered 2020-12-30: 4 mg via INTRAVENOUS
  Filled 2020-12-30: qty 2

## 2020-12-30 MED ORDER — ONDANSETRON HCL 4 MG PO TABS: 4.0000 mg | ORAL_TABLET | Freq: Four times a day (QID) | ORAL | Status: DC | PRN

## 2020-12-30 MED ORDER — SODIUM CHLORIDE 0.9 % IV BOLUS
500.0000 mL | Freq: Once | INTRAVENOUS | Status: AC
  Administered 2020-12-30: 500 mL via INTRAVENOUS

## 2020-12-30 MED ORDER — HYDROMORPHONE HCL 1 MG/ML IJ SOLN
0.5000 mg | Freq: Once | INTRAMUSCULAR | Status: AC
  Administered 2020-12-30: 0.5 mg via INTRAVENOUS
  Filled 2020-12-30: qty 1

## 2020-12-30 MED ORDER — SODIUM CHLORIDE 0.9 % IV BOLUS
1000.0000 mL | Freq: Once | INTRAVENOUS | Status: AC
  Administered 2020-12-30: 1000 mL via INTRAVENOUS

## 2020-12-30 MED ORDER — SODIUM CHLORIDE 0.9 % IV SOLN: 10.0000 mL/h | Freq: Once | INTRAVENOUS | Status: DC

## 2020-12-30 NOTE — ED Notes (Signed)
PT 100% on 4LNC. RN titrated to 2L [place] pt 02 at 100%. RN will continue to monitor and titrate as necessary.
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
## 2020-12-30 NOTE — ED Notes (Signed)
Patient is resting comfortably. 

## 2020-12-30 NOTE — ED Notes (Signed)
Attempted to call report

## 2020-12-30 NOTE — ED Notes (Signed)
Consulted with on call Dr. Regarding pt's BP maintaining in the 90's and pt still having no urine output and daughter concerned over pt appearing to becoming "more swollen" as though she is retaining fluid.

## 2020-12-30 NOTE — ED Notes (Signed)
Dr. Hulen Skains in response to page. She was informed of pt's BP, not having urine output, and concern of fluid retention from her daughter. Dr. Durward Fortes to continue on treatment course with no changes in orders at this time.

## 2020-12-30 NOTE — ED Notes (Signed)
MD Pollina notified of b/p systolic in 62'V

## 2020-12-30 NOTE — ED Notes (Addendum)
PT Desat in the upper 80's. PT placed on 2L. Pt continues to desat in 80's. Pt placed on 4 L 02 sat 95%.
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
## 2020-12-30 NOTE — Progress Notes (Signed)
Patient arrived on the unit from the ED on a stretcher, assessment completed see flow sheet , placed on tele ccmd notified, patient oriented to room and staff bed in lowest position, call bell within reach will continue to monitor.

## 2020-12-30 NOTE — ED Triage Notes (Signed)
Pt c/o increase hip pain on left side. Pt had hip surgery 7/29. Pt also c/o facial swelling post oral surgery on 7/29.   Pt on Warfarin

## 2020-12-30 NOTE — Hospital Course (Signed)
Admitted 12/30/2020  Allergies: Patient has no known allergies. Pertinent Hx: Aortic/mitral stenosis s/p mechanical VR, A. Fib, HTN, HLD, CKD, chronic hypoxic resp failure (on PRN home O2), HFpEF  60 y.o. female p/w left hip pain  * Hypovolemic Shock 2/2 Hemorrhage: Left hip graft harvest site oozing with 2 extra-large hematomas. Hgb went from 13 > 6.3. Received 2 units + IVF. BP stabilized. Ortho recommends medical mgmt.   * AKI on CKD: Suspect pre-renal +/- ATN. Not making urine.   * Valvular Dz s/p Replacement: Holding on anti-coags for now. Cards on board.   Consults: Ortho, Cards  Meds: Dilaudid, Zofran VTE ppx: None IVF: None Diet: HH
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
## 2020-12-30 NOTE — ED Notes (Signed)
Bladder volume scanned as pt has had no urine output today. Bladder scan volume 43mL at this time.
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
## 2020-12-30 NOTE — ED Provider Notes (Addendum)
Patient is postoperative and anticoagulated with Lovenox bridging and Coumadin.  She has very large hematoma formations and surgical sites of mandible and iliac.  She is hypotensive.  At this time plan to CT scan for bleeding source and consult orthopedics. Physical Exam  BP (!) 100/49   Pulse 80   Temp (!) 97.3 F (36.3 C) (Axillary)   Resp 19   Ht 5\' 6"  (1.676 m)   Wt 136.1 kg   SpO2 100%   BMI 48.42 kg/m   Physical Exam Constitutional:      Comments: Patient is alert with clear mental status.  No respiratory distress.  HENT:     Mouth/Throat:     Comments: Patient has extensive hematoma formation all around her mandible and lower neck.  Airway is patent without any stridor.  Patient does have notably small oral cavity. Pulmonary:     Effort: Pulmonary effort is normal.  Musculoskeletal:     Comments: Hematoma on left hip is firm but not hot to touch.  Skin:    General: Skin is warm and dry.     Coloration: Skin is pale.  Neurological:     General: No focal deficit present.     Mental Status: She is oriented to person, place, and time.  Psychiatric:        Mood and Affect: Mood normal.    ED Course/Procedures     Procedures  MDM  Patient is assessed and has stabilized since management by Dr. Waverly Ferrari.  Systolic blood pressures are consistently staying greater than 100.  Patient's mental status is clear.  She exhibits no respiratory distress.  Auscultations placed for admission and specialist management..  Consult: 08: 07 cards master Sharee Pimple we will place a consult for evaluation in the emergency department for counseling on anticoagulation in setting of extensive postoperative blood loss.  Consult: 08: [facility] for orthopedics regarding management of postoperative bleeding for hip graft.  Consult: 08 50 internal medicine teaching service for admission.     Charlesetta Shanks, MD 12/30/20 [place], [place], MD 12/30/20 [phone]

## 2020-12-30 NOTE — ED Notes (Signed)
Blood consent signed in chart.

## 2020-12-31 ENCOUNTER — Encounter (HOSPITAL_COMMUNITY): Payer: Self-pay | Admitting: Internal Medicine

## 2020-12-31 ENCOUNTER — Inpatient Hospital Stay (HOSPITAL_COMMUNITY): Payer: Medicare Other

## 2020-12-31 DIAGNOSIS — I5189 Other ill-defined heart diseases: Secondary | ICD-10-CM

## 2020-12-31 DIAGNOSIS — Z952 Presence of prosthetic heart valve: Secondary | ICD-10-CM | POA: Diagnosis not present

## 2020-12-31 DIAGNOSIS — T8201XA Breakdown (mechanical) of heart valve prosthesis, initial encounter: Secondary | ICD-10-CM | POA: Diagnosis not present

## 2020-12-31 DIAGNOSIS — R571 Hypovolemic shock: Secondary | ICD-10-CM | POA: Diagnosis not present

## 2020-12-31 LAB — CBC
HCT: 21.6 % — ABNORMAL LOW (ref 36.0–46.0)
HCT: 23.2 % — ABNORMAL LOW (ref 36.0–46.0)
HCT: 19.7 % — ABNORMAL LOW (ref 36.0–46.0)
Hemoglobin: 7 g/dL — ABNORMAL LOW (ref 12.0–15.0)
Hemoglobin: 7.8 g/dL — ABNORMAL LOW (ref 12.0–15.0)
Hemoglobin: 6.5 g/dL — CL (ref 12.0–15.0)
MCH: 31.5 pg (ref 26.0–34.0)
MCH: 32 pg (ref 26.0–34.0)
MCH: 31.9 pg (ref 26.0–34.0)
MCHC: 32.4 g/dL (ref 30.0–36.0)
MCHC: 33.6 g/dL (ref 30.0–36.0)
MCHC: 33 g/dL (ref 30.0–36.0)
MCV: 97.3 fL (ref 80.0–100.0)
MCV: 95.1 fL (ref 80.0–100.0)
MCV: 96.6 fL (ref 80.0–100.0)
Platelets: 205 10*3/uL (ref 150–400)
Platelets: 215 10*3/uL (ref 150–400)
Platelets: 225 10*3/uL (ref 150–400)
RBC: 2.22 MIL/uL — ABNORMAL LOW (ref 3.87–5.11)
RBC: 2.44 MIL/uL — ABNORMAL LOW (ref 3.87–5.11)
RBC: 2.04 MIL/uL — ABNORMAL LOW (ref 3.87–5.11)
RDW: 17.3 % — ABNORMAL HIGH (ref 11.5–15.5)
RDW: 16.7 % — ABNORMAL HIGH (ref 11.5–15.5)
RDW: 17 % — ABNORMAL HIGH (ref 11.5–15.5)
WBC: 18.4 10*3/uL — ABNORMAL HIGH (ref 4.0–10.5)
WBC: 20.2 10*3/uL — ABNORMAL HIGH (ref 4.0–10.5)
WBC: 22.9 10*3/uL — ABNORMAL HIGH (ref 4.0–10.5)
nRBC: 3.1 % — ABNORMAL HIGH (ref 0.0–0.2)
nRBC: 3.5 % — ABNORMAL HIGH (ref 0.0–0.2)
nRBC: 3.4 % — ABNORMAL HIGH (ref 0.0–0.2)

## 2020-12-31 LAB — COMPREHENSIVE METABOLIC PANEL
ALT: 11 U/L (ref 0–44)
AST: 25 U/L (ref 15–41)
Albumin: 2.6 g/dL — ABNORMAL LOW (ref 3.5–5.0)
Alkaline Phosphatase: 45 U/L (ref 38–126)
Anion gap: 8 (ref 5–15)
BUN: 51 mg/dL — ABNORMAL HIGH (ref 8–23)
CO2: 24 mmol/L (ref 22–32)
Calcium: 8.1 mg/dL — ABNORMAL LOW (ref 8.9–10.3)
Chloride: 100 mmol/L (ref 98–111)
Creatinine, Ser: 2.58 mg/dL — ABNORMAL HIGH (ref 0.44–1.00)
GFR, Estimated: 21 mL/min — ABNORMAL LOW (ref >60)
Glucose, Bld: 146 mg/dL — ABNORMAL HIGH (ref 70–99)
Potassium: 5.1 mmol/L (ref 3.5–5.1)
Sodium: 132 mmol/L — ABNORMAL LOW (ref 135–145)
Total Bilirubin: 1.3 mg/dL — ABNORMAL HIGH (ref 0.3–1.2)
Total Protein: 5.2 g/dL — ABNORMAL LOW (ref 6.5–8.1)

## 2020-12-31 LAB — PREPARE RBC (CROSSMATCH)

## 2020-12-31 LAB — GLUCOSE, CAPILLARY: Glucose-Capillary: 164 mg/dL — ABNORMAL HIGH (ref 70–99)

## 2020-12-31 LAB — PROTIME-INR
INR: 1.4 — ABNORMAL HIGH (ref 0.8–1.2)
Prothrombin Time: 17.1 seconds — ABNORMAL HIGH (ref 11.4–15.2)

## 2020-12-31 MED ORDER — SODIUM CHLORIDE 0.9 % NICU IV INFUSION SIMPLE: 10.0000 mL/kg | INJECTION | Freq: Once | INTRAVENOUS | Status: DC

## 2020-12-31 MED ORDER — ACETAMINOPHEN 325 MG PO TABS
650.0000 mg | ORAL_TABLET | Freq: Four times a day (QID) | ORAL | Status: DC | PRN
  Administered 2021-01-05 – 2021-01-07 (×6): 650 mg via ORAL
  Filled 2020-12-31 (×6): qty 2

## 2020-12-31 MED ORDER — SODIUM CHLORIDE 0.9 % IV BOLUS
10.0000 mL/kg | Freq: Once | INTRAVENOUS | Status: AC
  Administered 2020-12-31: 1363 mL via INTRAVENOUS

## 2020-12-31 MED ORDER — FAMOTIDINE 20 MG PO TABS
20.0000 mg | ORAL_TABLET | Freq: Every day | ORAL | Status: DC
  Administered 2020-12-31 – 2021-01-09 (×10): 20 mg via ORAL
  Filled 2020-12-31 (×10): qty 1

## 2020-12-31 MED ORDER — AMOXICILLIN 500 MG PO CAPS
500.0000 mg | ORAL_CAPSULE | Freq: Two times a day (BID) | ORAL | Status: AC
  Administered 2020-12-31 – 2021-01-01 (×4): 500 mg via ORAL
  Filled 2020-12-31 (×4): qty 1

## 2020-12-31 MED ORDER — SODIUM CHLORIDE 0.9% IV SOLUTION: Freq: Once | INTRAVENOUS | Status: AC

## 2020-12-31 MED ORDER — ORAL CARE MOUTH RINSE
15.0000 mL | Freq: Two times a day (BID) | OROMUCOSAL | Status: DC
  Administered 2020-12-31 – 2021-01-09 (×18): 15 mL via OROMUCOSAL

## 2020-12-31 MED ORDER — SODIUM CHLORIDE 0.9% IV SOLUTION
Freq: Once | INTRAVENOUS | Status: AC
  Administered 2020-12-31: 10 mL/h via INTRAVENOUS

## 2020-12-31 MED ORDER — CITALOPRAM HYDROBROMIDE 20 MG PO TABS
20.0000 mg | ORAL_TABLET | Freq: Every day | ORAL | Status: DC
  Administered 2020-12-31 – 2021-01-09 (×10): 20 mg via ORAL
  Filled 2020-12-31 (×10): qty 1

## 2020-12-31 NOTE — Progress Notes (Signed)
Subjective: I seen and evaluated [name] at bedside.  [name] was lying 45 degree angle in bed, she reports feeling uncomfortable.  She expressed generalized pain due to the swelling in her abdomen.  She reports slight swelling in her neck but denies shortness of breath or dysphagia.  She states that she has not urinated today and is not experiencing urgency. She is drinking fluids however.  She denies fever or chills.  Denies chest pain.  Objective:  Vital signs in last 24 hours: Vitals:   12/31/20 0428 12/31/20 0545 12/31/20 0801 12/31/20 1300  BP:  (!) 115/53 104/69 (!) 96/54  Pulse:  97 (!) 101 98  Resp:  15 17 15   Temp:  98.4 F (36.9 C) 97.9 F (36.6 C) 98 F (36.7 C)  TempSrc:  Oral  Oral  SpO2:  94% 93% 91%  Weight: (!) 136.3 kg     Height:       Physical Exam Constitutional:      Appearance: She is obese.  HENT:     Head: Normocephalic and atraumatic.  Eyes:     Extraocular Movements: Extraocular movements intact.     Comments: periorbital ecchymosis present  Neck:     Comments: Bluish discoloration and minimal swelling to the mandibular region and above thyroid gland. Cardiovascular:     Rate and Rhythm: Normal rate and regular rhythm.     Pulses:          Radial pulses are 2+ on the right side and 1+ on the left side.       Posterior tibial pulses are 1+ on the right side and 1+ on the left side.     Heart sounds: Normal heart sounds.  Pulmonary:     Effort: Pulmonary effort is normal.     Breath sounds: Normal breath sounds.  Chest:  Breasts:    Right: No supraclavicular adenopathy.     Left: No supraclavicular adenopathy.  Abdominal:     General: There is distension.     Palpations: Abdomen is soft.     Tenderness: There is no abdominal tenderness.     Comments: Large hematoma present most prominently in the LLQ.  Musculoskeletal:     Cervical back: Normal range of motion. Edema present. No muscular tenderness.     Right lower leg: 2+  Edema present.     Comments: Left hip- residual edematous tissue present  Lymphadenopathy:     Head:     Right side of head: No submandibular adenopathy.     Left side of head: No submandibular adenopathy.     Cervical: No cervical adenopathy.     Right cervical: No superficial or deep cervical adenopathy.    Left cervical: No superficial or deep cervical adenopathy.     Upper Body:     Right upper body: No supraclavicular adenopathy.     Left upper body: No supraclavicular adenopathy.  Neurological:     General: No focal deficit present.     Mental Status: She is alert.  Psychiatric:        Attention and Perception: Attention normal.        Behavior: Behavior normal. Behavior is cooperative.     Assessment/Plan:  Active Problems:   Hypovolemic shock (HCC)  #Hypotension/ shock, resolved: BP initially in the 66Y systolic, but improved with IVFs and blood products 3 unit transfused while in the ED. Remains tenuous. Current BP ranges systolic 40-347 and diastolic 42V. -Monitor vitals -antihypertensives held -  Lasix Held   #Large left wall hematomas: in the setting of recent dental surgery with hip to maxilla grafting. Now with significant anemia, Hgb of 6.3 on admission. Received blood products in the ED. Current hgb improving at 7.8. Evaluated by ortho with recommendations for medical management given high risk (lovenox/coumadin bridging). CT of abdomen/pelvis reveals large intramuscular hematoma of the left lateral abdominal wall.  CT of the neck reveals mild fluid in upper neck, no hematoma. -Hold all anticoagulation - Repeat PT/INR  # Aortic Stenosis s/p mechanical AVR # Mitral Stenosis s/p mechanical MVR Long-standing anti-coagulation with Coumadin.  She is at extremely high risk of an acute cerebrovascular accident given 2 mechanical valves.  She may eventually benefit from starting heparin drip that can be quickly reversed if hematoma should enlarge. - Hold all  anticoagulation per Cardiology recommendation; High concern for further blood loss   # Acute Kidney Injury # Chronic Kidney Disease Stage 3 Patient's creatinine on arrival was 1.85 with a preoperative creatinine of 1.31.  Over the last 2 years, patient's creatinine has fluctuated between 1.1 and 1.4.  I suspect current AKI is prerenal in the setting of profound hypovolemia, ATN suspected due to worsening creatinine function and no urine output. Creatinine is 2.58. Patient has no urine output today. -IVF Bolus ordered - Repeat BMP in the morning - Urinalysis pending; no sample as been collected - Renal US ordered to determine possible obstruction - Bladder scan Q6H to assess bladder volume -Renal consult   # Hx of Atrial Fibrillation On metoprolol and Coumadin.  EKG on arrival shows sinus rhythm.  Coumadin is being held given hematoma development.  Metoprolol being held given hypotension. - Continue holding Metoprolol and Coumadin   # Hx of Hypertension  - On irbesartan and metoprolol at home.  Given hypotension on admission, will hold anti-hypertensives for now. Patient vital remain below goal ~130/80   # Chronic hypoxic respiratory failure  Multifactorial in the setting of asthma, idiopathic pulmonary hemosiderosis, ILD.  She is currently only on oxygen 2 L as needed at home.  Since arrival to the ED, patient has been placed on 2 L for comfort.  No hypoxic events noted. Patient has been saturation range above 92% - Continue supplemental oxygen PRN to maintain oxygen saturation > 92%   # HFpEF Patient is currently on Lasix daily. She notes that she is prone to volume overloading quickly, so we will need to monitor her fluid status closely. - Continue holding Lasix d/t hypovolemic state   Prior to Admission Living Arrangement: Anticipated Discharge Location: Barriers to Discharge: Dispo: Anticipated discharge in approximately 5-10 day(s).   Timothy Lasso, MD 12/31/2020, 3:10  PM Pager: [phone] After 5pm on weekdays and 1pm on weekends: On Call pager [phone]

## 2020-12-31 NOTE — Progress Notes (Signed)
Paged IM service regarding fluid bolus and what the plan would be s/p bolus if patient is still unable to void or be straight cath.  Waiting return call.

## 2020-12-31 NOTE — Progress Notes (Addendum)
Progress Note  Patient Name: [name] Date of Encounter: 12/31/2020  [facility] Cardiologist: Belva Crome III, MD   Subjective   Sore, but pain is controlled.   Inpatient Medications    Scheduled Meds:  amoxicillin  500 mg Oral Q12H   citalopram  20 mg Oral Daily   mouth rinse  15 mL Mouth Rinse BID   sodium chloride flush  3 mL Intravenous Q12H   Continuous Infusions:  sodium chloride Stopped (12/30/20 0711)   PRN Meds: HYDROmorphone (DILAUDID) injection, ondansetron **OR** ondansetron (ZOFRAN) IV   Vital Signs    Vitals:   12/31/20 0325 12/31/20 0428 12/31/20 0545 12/31/20 0801  BP: (!) 108/48  (!) 115/53 104/69  Pulse: 98  97 (!) 101  Resp: 18  15 17   Temp: 98.6 F (37 C)  98.4 F (36.9 C) 97.9 F (36.6 C)  TempSrc: Oral  Oral   SpO2: 93%  94% 93%  Weight:  (!) 136.3 kg    Height:        Intake/Output Summary (Last 24 hours) at 12/31/2020 1114 Last data filed at 12/31/2020 0900 Gross per 24 hour  Intake 1110 ml  Output 0 ml  Net 1110 ml   Last 3 Weights 12/31/2020 12/30/2020 12/26/2020  Weight (lbs) 300 lb 7.8 oz 300 lb 292 lb  Weight (kg) 136.3 kg 136.079 kg 132.45 kg      Telemetry    SR - Personally Reviewed  ECG    No new tracing  Physical Exam   GEN: No acute distress.   Neck: No JVD Cardiac: RRR, crisp mechanical valve clicks, rubs, or gallops.  Respiratory: Clear to auscultation bilaterally. GI: Large left sided abd wall hematoma/significant bruising MS: No edema; No deformity. Neuro:  Nonfocal  Psych: Normal affect   Labs    High Sensitivity Troponin:  No results for input(s): TROPONINIHS in the last 720 hours.    Chemistry Recent Labs  Lab 12/26/20 0601 12/26/20 1432 12/30/20 0546 12/30/20 0559 12/31/20 0824  NA 140  --  135 136 132*  K 3.7  --  4.7 4.6 5.1  CL 103  --  101 101 100  CO2 28  --  23  --  24  GLUCOSE 94  --  153* 158* 146*  BUN 19  --  36* 33* 51*  CREATININE 1.31* 1.52* 1.85* 1.90* 2.58*   CALCIUM 9.6  --  9.1  --  8.1*  PROT  --   --   --   --  5.2*  ALBUMIN  --   --   --   --  2.6*  AST  --   --   --   --  25  ALT  --   --   --   --  11  ALKPHOS  --   --   --   --  45  BILITOT  --   --   --   --  1.3*  GFRNONAA 46* 39* 31*  --  21*  ANIONGAP 9  --  11  --  8     Hematology Recent Labs  Lab 12/30/20 1905 12/31/20 0046 12/31/20 0824  WBC 17.7* 18.4* 20.2*  RBC 2.44* 2.22* 2.44*  HGB 7.7* 7.0* 7.8*  HCT 23.9* 21.6* 23.2*  MCV 98.0 97.3 95.1  MCH 31.6 31.5 32.0  MCHC 32.2 32.4 33.6  RDW 17.2* 17.3* 16.7*  PLT 227 205 215    BNPNo results for input(s): BNP,  PROBNP in the last 168 hours.   DDimer No results for input(s): DDIMER in the last 168 hours.   Radiology    CT ABDOMEN PELVIS WO CONTRAST  Result Date: 12/30/2020 CLINICAL DATA:  Retroperitoneal hematoma suspected EXAM: CT ABDOMEN AND PELVIS WITHOUT CONTRAST TECHNIQUE: Multidetector CT imaging of the abdomen and pelvis was performed following the standard protocol without IV contrast. COMPARISON:  CT abdomen pelvis dated February 01, 2018 FINDINGS: Lower chest: Bibasilar atelectasis. Prior aortic and mitral valve replacement. No pericardial effusion. Hepatobiliary: No focal liver abnormality. Gallstones are present. Hyperdense material in the gallbladder, likely related to hemolysis. No biliary ductal dilation. Pancreas: Unremarkable Spleen: Unremarkable Adrenals/Urinary Tract: A bulging lines are unremarkable. Exophytic lesion of the mid region of left kidney, likely a simple cyst. Additional lesion is seen in the upper pole of the left kidney which is too small to completely characterize. No hydronephrosis. Bladder is decompressed. Stomach/Bowel: Small hiatal hernia and postsurgical changes of sleeve gastrectomy. Bowel is normal in caliber with no evidence of obstruction or wall thickening. Vascular/Lymphatic: Aortic atherosclerosis. No enlarged abdominal or pelvic lymph nodes. Reproductive: Uterus and bilateral  adnexa are unremarkable. Other: A fluid collection with hematocrit level is seen within the muscles of the lateral left abdominal wall measuring 16.0 x 10.8 cm in axial dimension and up to 18.0 cm in craniocaudal dimension. Collection is contiguous with iliac vein harvest site inferiorly, best seen on series 10 image 201. An additional high density fluid collection with associated locules of air seen in the superficial soft tissues of the lateral left abdominal wall measuring 12.4 x 8.2 cm in axial dimension and 11.3 cm in craniocaudal dimension. Musculoskeletal: Osseous defect of the anterior left iliac wing in keeping with iliac harvest. IMPRESSION: Large intramuscular hematoma of the lateral left abdominal wall which is contiguous with iliac wing harvest site. Additional adjacent smaller hematoma with associated locules of air is seen in the superficial soft tissues of the lateral left abdominal wall. Critical Value/emergent results were called by telephone at the time of interpretation on 12/30/2020 at 8:50 am to provider Dr. Ermalinda Memos, Who verbally acknowledged these results. Electronically Signed   By: Yetta Glassman MD   On: 12/30/2020 08:52   CT Soft Tissue Neck Wo Contrast  Result Date: 12/30/2020 CLINICAL DATA:  Soft tissue mass, neck, spontaneous hemorrhage EXAM: CT NECK WITHOUT CONTRAST TECHNIQUE: Multidetector CT imaging of the neck was performed following the standard protocol without intravenous contrast. COMPARISON:  None. FINDINGS: Pharynx and larynx: Normal. No mass or swelling. Salivary glands: Small volume of fluid superficial to the parotid mass bilaterally. Otherwise, unremarkable appearance of the parotid and submandibular glands, which are symmetric in not edematous/enlarged. No mass. Thyroid: Normal. Lymph nodes: None enlarged or abnormal density. Vascular: Calcific atherosclerosis with retropharyngeal course of the carotid arteries. Nondiagnostic evaluation of patency due to the absence of  contrast. Limited intracranial: Very limited evaluation without obvious abnormality in the visualized brain. Visualized orbits: Not imaged. Mastoids and visualized paranasal sinuses: The patient's maxillary ridge reconstruction is incompletely imaged. Evidence of bone graft material along the maxillary ridge with malleable plate and screw fixation. Mild mucosal thickening of the imaged inferior maxillary sinuses. Small right mastoid effusion. No left mastoid effusion. Skeleton: Moderate multilevel degenerative change of the cervical spine with reversal of the normal cervical lordosis. Upper chest: Visualized lung apices are clear.  Median sternotomy. Other: Mild nonspecific fluid/edema superficially in the upper neck with small volume fluid superficial to the parotid glands tracking inferiorly along  the platysma. IMPRESSION: 1. Mild nonspecific fluid/edema superficially in the upper neck with small volume fluid superficial to the parotid glands tracking inferiorly along the platysma. No large hematoma in the neck. 2. Please note that only the patient's maxillary ridge reconstruction is incompletely imaged and a maxillofacial CT could further characterize if clinically indicated. Electronically Signed   By: Margaretha Sheffield MD   On: 12/30/2020 08:49    Cardiac Studies   N/a   Patient Profile     60 y.o. female with a hx of valvular heart disease s/p MVR/AVR w/22mm mechanical mitral vale and 19 mm mechanical aortic valve, ILD, polycythemia, CKD stage II, HTN, HLD, OSA and morbid obesity who is being seen 12/30/2020 for the evaluation of bleeding/need for [facility] at the request of Dr. Jimmye Norman.  Assessment & Plan    Large left wall hematomas: in the setting of recent dental surgery with hip to maxilla grating. Now with significant anemia, Hgb of 6.3 on admission. Evaluated by ortho with recommendations for medical management given high risk (lovenox/coumadin bridging) -- received 3 units PRBCs, Hgb only up to  7.8 -- management per primary   S/p MVR/AVR: done in 2016 at [facility]. Has been on coumadin/bridge prior to surgery. Says she resumed her coumadin on 7/30. Very complicated scenario with high risk for stroke without OAC but significant blood loss anemia. At this time we are forced to stop anticoagulation until it is safe to resume. Needs to see a stable trend in Hgb. She is aware of the risk of stroke in this setting. -- INR 1.4   Hypotension/Shock: BP initially in the 92E systolic, but improved with IVFs and blood products while in the ED. Remains tenuous.  -- antihypertensives held   CKD stage II: baseline 1.2-1.3 -- 1.85>>1.9 on admission in the setting of hypovolemia, now up to 2.58. Concern for ATN. Management per primary -- daily BMET  For questions or updates, please contact [place] Please consult [email or web address] for contact info under        Signed, Reino Bellis, NP  12/31/2020, 11:14 AM    Agree with note by Reino Bellis [name]  Left flank pain improved.  Patient evaluated by Dr. Lorin Mercy, orthopedic surgery, who felt that surgical evacuation was not indicated at this time.  Patient has required blood transfusions for blood loss anemia plus or minus hemorrhagic shock.  Hemoglobin currently 7.8, probably requiring another unit of blood.  Currently off anticoagulation.  If she will be when to restart Coumadin in setting of significant anemia and large left flank hematoma in setting of 2 mechanical heart valves.  I suspect that her progressive renal insufficiency is multifactorial probably related to "third spacing", and ATN.  Lorretta Harp, M.D., [place], [facility], [place]verta [place] [address] [place], [place]  [address]  [phone] 12/31/2020 12:38 PM

## 2020-12-31 NOTE — Progress Notes (Signed)
Subjective:    Patient reports pain as moderate and severe.    Objective: Vital signs in last 24 hours: Temp:  [97.7 F (36.5 C)-98.6 F (37 C)] 97.9 F (36.6 C) (08/03 0801) Pulse Rate:  [75-101] 101 (08/03 0801) Resp:  [10-25] 17 (08/03 0801) BP: (89-127)/(45-76) 104/69 (08/03 0801) SpO2:  [91 %-99 %] 93 % (08/03 0801) Weight:  [136.3 kg] 136.3 kg (08/03 0428)  Intake/Output from previous day: 08/02 0701 - 08/03 0700 In: 2925 [P.O.:480; Blood:945; IV Piggyback:1500] Out: 0  Intake/Output this shift: No intake/output data recorded.  Recent Labs    12/30/20 0546 12/30/20 0559 12/30/20 1400 12/30/20 1905 12/31/20 0046  HGB 6.3* 7.8* 7.7* 7.7* 7.0*   Recent Labs    12/30/20 1905 12/31/20 0046  WBC 17.7* 18.4*  RBC 2.44* 2.22*  HCT 23.9* 21.6*  PLT 227 205   Recent Labs    12/30/20 0546 12/30/20 0559  NA 135 136  K 4.7 4.6  CL 101 101  CO2 23  --   BUN 36* 33*  CREATININE 1.85* 1.90*  GLUCOSE 153* 158*  CALCIUM 9.1  --    Recent Labs    12/30/20 0546 12/30/20 1905  INR 1.4* 1.4*    Left ICBG hematoma . 3 units of PRBC's given and Hgb 7.0 .  CT ABDOMEN PELVIS WO CONTRAST  Result Date: 12/30/2020 CLINICAL DATA:  Retroperitoneal hematoma suspected EXAM: CT ABDOMEN AND PELVIS WITHOUT CONTRAST TECHNIQUE: Multidetector CT imaging of the abdomen and pelvis was performed following the standard protocol without IV contrast. COMPARISON:  CT abdomen pelvis dated February 01, 2018 FINDINGS: Lower chest: Bibasilar atelectasis. Prior aortic and mitral valve replacement. No pericardial effusion. Hepatobiliary: No focal liver abnormality. Gallstones are present. Hyperdense material in the gallbladder, likely related to hemolysis. No biliary ductal dilation. Pancreas: Unremarkable Spleen: Unremarkable Adrenals/Urinary Tract: A bulging lines are unremarkable. Exophytic lesion of the mid region of left kidney, likely a simple cyst. Additional lesion is seen in the upper  pole of the left kidney which is too small to completely characterize. No hydronephrosis. Bladder is decompressed. Stomach/Bowel: Small hiatal hernia and postsurgical changes of sleeve gastrectomy. Bowel is normal in caliber with no evidence of obstruction or wall thickening. Vascular/Lymphatic: Aortic atherosclerosis. No enlarged abdominal or pelvic lymph nodes. Reproductive: Uterus and bilateral adnexa are unremarkable. Other: A fluid collection with hematocrit level is seen within the muscles of the lateral left abdominal wall measuring 16.0 x 10.8 cm in axial dimension and up to 18.0 cm in craniocaudal dimension. Collection is contiguous with iliac vein harvest site inferiorly, best seen on series 10 image 201. An additional high density fluid collection with associated locules of air seen in the superficial soft tissues of the lateral left abdominal wall measuring 12.4 x 8.2 cm in axial dimension and 11.3 cm in craniocaudal dimension. Musculoskeletal: Osseous defect of the anterior left iliac wing in keeping with iliac harvest. IMPRESSION: Large intramuscular hematoma of the lateral left abdominal wall which is contiguous with iliac wing harvest site. Additional adjacent smaller hematoma with associated locules of air is seen in the superficial soft tissues of the lateral left abdominal wall. Critical Value/emergent results were called by telephone at the time of interpretation on 12/30/2020 at 8:50 am to provider Dr. Ermalinda Memos, Who verbally acknowledged these results. Electronically Signed   By: Yetta Glassman MD   On: 12/30/2020 08:52   CT Soft Tissue Neck Wo Contrast  Result Date: 12/30/2020 CLINICAL DATA:  Soft tissue mass, neck,  spontaneous hemorrhage EXAM: CT NECK WITHOUT CONTRAST TECHNIQUE: Multidetector CT imaging of the neck was performed following the standard protocol without intravenous contrast. COMPARISON:  None. FINDINGS: Pharynx and larynx: Normal. No mass or swelling. Salivary glands: Small  volume of fluid superficial to the parotid mass bilaterally. Otherwise, unremarkable appearance of the parotid and submandibular glands, which are symmetric in not edematous/enlarged. No mass. Thyroid: Normal. Lymph nodes: None enlarged or abnormal density. Vascular: Calcific atherosclerosis with retropharyngeal course of the carotid arteries. Nondiagnostic evaluation of patency due to the absence of contrast. Limited intracranial: Very limited evaluation without obvious abnormality in the visualized brain. Visualized orbits: Not imaged. Mastoids and visualized paranasal sinuses: The patient's maxillary ridge reconstruction is incompletely imaged. Evidence of bone graft material along the maxillary ridge with malleable plate and screw fixation. Mild mucosal thickening of the imaged inferior maxillary sinuses. Small right mastoid effusion. No left mastoid effusion. Skeleton: Moderate multilevel degenerative change of the cervical spine with reversal of the normal cervical lordosis. Upper chest: Visualized lung apices are clear.  Median sternotomy. Other: Mild nonspecific fluid/edema superficially in the upper neck with small volume fluid superficial to the parotid glands tracking inferiorly along the platysma. IMPRESSION: 1. Mild nonspecific fluid/edema superficially in the upper neck with small volume fluid superficial to the parotid glands tracking inferiorly along the platysma. No large hematoma in the neck. 2. Please note that only the patient's maxillary ridge reconstruction is incompletely imaged and a maxillofacial CT could further characterize if clinically indicated. Electronically Signed   By: Margaretha Sheffield MD   On: 12/30/2020 08:49    Assessment/Plan:    Plan:  no surgery indicated. Ice rest, discussed with pt and daughter that hematoma will resorb with time.   Marybelle Killings 12/31/2020, 8:07 AM

## 2020-12-31 NOTE — Progress Notes (Signed)
Labs results paged to MD.

## 2020-12-31 NOTE — Progress Notes (Signed)
Patient has a new order to transfuse 1 unit of RBCs, transfusion started. I asked pt if she wants me to update her sister, pt replied that "she must be out by now, I will talk to her in the morning". We'll continue to monitor.

## 2020-12-31 NOTE — Progress Notes (Signed)
Patient has not voided on this shift, bladder scan shows 165 cc, patient denies feelings of fullness or discomfort in her bladder. Dean MD notified, no new orders received will continue to monitor.

## 2020-12-31 NOTE — Progress Notes (Signed)
Received call back from IM MD regarding anuria.  Patient has not voided today or last night.  Bladder scans are inaccurate due to patient anatomy.  Patient having pressure and feeling of needing to void.  No distention palpable.  Straight cath attempted x 3.   Straight cath in correct position but no UOP.  MD aware.  Informed to let night RN know to call on-call IM if no UOP by 0500.  Renal US ordered.  Depending on whether patient is able to void during the night and/ or renal US results, nephrology may be consulted per MD.

## 2021-01-01 DIAGNOSIS — Z952 Presence of prosthetic heart valve: Secondary | ICD-10-CM | POA: Diagnosis not present

## 2021-01-01 DIAGNOSIS — D62 Acute posthemorrhagic anemia: Secondary | ICD-10-CM | POA: Diagnosis not present

## 2021-01-01 DIAGNOSIS — I480 Paroxysmal atrial fibrillation: Secondary | ICD-10-CM | POA: Diagnosis not present

## 2021-01-01 DIAGNOSIS — R571 Hypovolemic shock: Secondary | ICD-10-CM | POA: Diagnosis not present

## 2021-01-01 DIAGNOSIS — R578 Other shock: Secondary | ICD-10-CM

## 2021-01-01 LAB — CBC WITH DIFFERENTIAL/PLATELET
Abs Immature Granulocytes: 0.99 10*3/uL — ABNORMAL HIGH (ref 0.00–0.07)
Abs Immature Granulocytes: 1.06 10*3/uL — ABNORMAL HIGH (ref 0.00–0.07)
Basophils Absolute: 0.1 10*3/uL (ref 0.0–0.1)
Basophils Absolute: 0.1 10*3/uL (ref 0.0–0.1)
Basophils Relative: 0 %
Basophils Relative: 0 %
Eosinophils Absolute: 0.1 10*3/uL (ref 0.0–0.5)
Eosinophils Absolute: 0 10*3/uL (ref 0.0–0.5)
Eosinophils Relative: 0 %
Eosinophils Relative: 0 %
HCT: 23.1 % — ABNORMAL LOW (ref 36.0–46.0)
HCT: 21.7 % — ABNORMAL LOW (ref 36.0–46.0)
Hemoglobin: 7.7 g/dL — ABNORMAL LOW (ref 12.0–15.0)
Hemoglobin: 7.2 g/dL — ABNORMAL LOW (ref 12.0–15.0)
Immature Granulocytes: 5 %
Immature Granulocytes: 5 %
Lymphocytes Relative: 8 %
Lymphocytes Relative: 7 %
Lymphs Abs: 1.7 10*3/uL (ref 0.7–4.0)
Lymphs Abs: 1.5 10*3/uL (ref 0.7–4.0)
MCH: 31.7 pg (ref 26.0–34.0)
MCH: 32 pg (ref 26.0–34.0)
MCHC: 33.3 g/dL (ref 30.0–36.0)
MCHC: 33.2 g/dL (ref 30.0–36.0)
MCV: 95.1 fL (ref 80.0–100.0)
MCV: 96.4 fL (ref 80.0–100.0)
Monocytes Absolute: 2.6 10*3/uL — ABNORMAL HIGH (ref 0.1–1.0)
Monocytes Absolute: 2.5 10*3/uL — ABNORMAL HIGH (ref 0.1–1.0)
Monocytes Relative: 12 %
Monocytes Relative: 12 %
Neutro Abs: 15.9 10*3/uL — ABNORMAL HIGH (ref 1.7–7.7)
Neutro Abs: 16.1 10*3/uL — ABNORMAL HIGH (ref 1.7–7.7)
Neutrophils Relative %: 75 %
Neutrophils Relative %: 76 %
Platelets: 222 10*3/uL (ref 150–400)
Platelets: 237 10*3/uL (ref 150–400)
RBC: 2.43 MIL/uL — ABNORMAL LOW (ref 3.87–5.11)
RBC: 2.25 MIL/uL — ABNORMAL LOW (ref 3.87–5.11)
RDW: 17.2 % — ABNORMAL HIGH (ref 11.5–15.5)
RDW: 17.6 % — ABNORMAL HIGH (ref 11.5–15.5)
WBC: 21.2 10*3/uL — ABNORMAL HIGH (ref 4.0–10.5)
WBC: 21.2 10*3/uL — ABNORMAL HIGH (ref 4.0–10.5)
nRBC: 2.4 % — ABNORMAL HIGH (ref 0.0–0.2)
nRBC: 2.2 % — ABNORMAL HIGH (ref 0.0–0.2)

## 2021-01-01 LAB — URINALYSIS, ROUTINE W REFLEX MICROSCOPIC
Bilirubin Urine: NEGATIVE
Glucose, UA: NEGATIVE mg/dL
Hgb urine dipstick: NEGATIVE
Ketones, ur: NEGATIVE mg/dL
Nitrite: NEGATIVE
Protein, ur: NEGATIVE mg/dL
Specific Gravity, Urine: 1.02 (ref 1.005–1.030)
pH: 5 (ref 5.0–8.0)

## 2021-01-01 LAB — COMPREHENSIVE METABOLIC PANEL
ALT: 12 U/L (ref 0–44)
AST: 32 U/L (ref 15–41)
Albumin: 2.6 g/dL — ABNORMAL LOW (ref 3.5–5.0)
Alkaline Phosphatase: 40 U/L (ref 38–126)
Anion gap: 10 (ref 5–15)
BUN: 51 mg/dL — ABNORMAL HIGH (ref 8–23)
CO2: 21 mmol/L — ABNORMAL LOW (ref 22–32)
Calcium: 8.6 mg/dL — ABNORMAL LOW (ref 8.9–10.3)
Chloride: 100 mmol/L (ref 98–111)
Creatinine, Ser: 2.17 mg/dL — ABNORMAL HIGH (ref 0.44–1.00)
GFR, Estimated: 25 mL/min — ABNORMAL LOW (ref >60)
Glucose, Bld: 120 mg/dL — ABNORMAL HIGH (ref 70–99)
Potassium: 5 mmol/L (ref 3.5–5.1)
Sodium: 131 mmol/L — ABNORMAL LOW (ref 135–145)
Total Bilirubin: 1.7 mg/dL — ABNORMAL HIGH (ref 0.3–1.2)
Total Protein: 5.3 g/dL — ABNORMAL LOW (ref 6.5–8.1)

## 2021-01-01 LAB — PROTIME-INR
INR: 1.3 — ABNORMAL HIGH (ref 0.8–1.2)
Prothrombin Time: 16.1 seconds — ABNORMAL HIGH (ref 11.4–15.2)

## 2021-01-01 LAB — PATHOLOGIST SMEAR REVIEW

## 2021-01-01 MED ORDER — SODIUM CHLORIDE 0.9 % NICU IV INFUSION SIMPLE
INJECTION | INTRAVENOUS | Status: DC
  Filled 2021-01-01: qty 500

## 2021-01-01 MED ORDER — SODIUM CHLORIDE 0.9 % IV SOLN: INTRAVENOUS | Status: DC

## 2021-01-01 NOTE — Progress Notes (Signed)
Subjective: I seen and evaluated [name] at bedside. She is accompanied by her sister, [name]. She reports feeling much better. She reports pain scale 5/10 currently. Denies need for increase in pain medicine. Denies chest pain, SOB, or dysphagia.   She produced 936ml of urine with straight catheter. Denies lightheadedness/dizziness. Reports that she is drinking clear fluids throughout the day.  Objective:  Vital signs in last 24 hours: Vitals:   12/31/20 2239 12/31/20 2300 01/01/21 0147 01/01/21 0310  BP: (!) 114/95 (!) 116/58 122/69 112/69  Pulse: (!) 101 96 92 96  Resp: 20 20 (!) 22 19  Temp: 98 F (36.7 C) 98.8 F (37.1 C) 98.8 F (37.1 C) 98.4 F (36.9 C)  TempSrc: Oral Oral Oral Oral  SpO2: 93% 96% 94% 93%  Weight:    (!) 139.3 kg  Height:       Physical Exam Constitutional:      Appearance: She is obese.  HENT:     Head: Normocephalic and atraumatic.     Comments: Bluish bruising around her jaw line.     Mouth/Throat:     Comments: No teeth on top and bottom row. Eyes:     Comments: Periorbital ecchymosis present  Neck:     Comments: Minimal swelling around the anterior surface of the neck Cardiovascular:     Rate and Rhythm: Normal rate and regular rhythm.     Heart sounds: Normal heart sounds.  Pulmonary:     Effort: Pulmonary effort is normal.     Breath sounds: Normal breath sounds. No wheezing, rhonchi or rales.  Abdominal:     General: Bowel sounds are normal.     Tenderness: There is abdominal tenderness (mild tenderness on palpation of LLQ).     Comments: Bruising noted along LLQ, left hip, and mid axillary region. Mesh adhesion on LLQ of abdomin. No erythema surrounding the mesh, clean and dry.  Rigidity noted along LLQ of abdomin. All other quadrants soft  Musculoskeletal:     Cervical back: Full passive range of motion without pain.     Right lower leg: No tenderness. 1+ Edema present.     Left lower leg: 2+ Edema present.  Skin:     General: Skin is warm and dry.  Neurological:     General: No focal deficit present.     Mental Status: She is alert.  Psychiatric:        Attention and Perception: Attention normal.        Behavior: Behavior normal. Behavior is cooperative.     Assessment/Plan:  Active Problems:   Hypovolemic shock (HCC)  #Hypotension/ shock, resolved: BP initially in the 92E systolic, but improved with IVFs and blood products 2 unit transfused while in the ED. Remains tenuous. Current BP ranges systolic 268-341 and diastolic 96Q. Hgb fell below 7 overnight, 6.6 and one unit of blood products were transfused. -Continue IVF -Monitor vitals -antihypertensives held - Lasix Held   #Large left wall hematomas: in the setting of recent dental surgery with hip to maxilla grafting. Now with significant anemia, Hgb of 6.3 on admission. Received 2 units of blood products in the ED. Current hgb improving at 7.8. CT of abdomen/pelvis reveals large intramuscular hematoma of the left lateral abdominal wall.  CT of the neck reveals mild fluid in upper neck, no hematoma. Mild improvement noted on physical exam. Less rigidity noted on palpation of LLQ. Cardiologist recommend resuming coumadin anticoagulation ina few days due to high risk of cardiovascular event.  Ortho recommends minimal activity and use of walker when ambulating around the room. -Hold all anticoagulation - Repeat PT/INR BID   # Aortic Stenosis s/p mechanical AVR # Mitral Stenosis s/p mechanical MVR Long-standing anti-coagulation with Coumadin.  She is at extremely high risk of an acute cerebrovascular accident given 2 mechanical valves. May resume anticoagulation in a few days per cardiology recommendation. - Hold all anticoagulation per Cardiology recommendation.   #  Oliguric Acute Tubular Necrosis d/ AKI on CKD caused by hypovolemia # Chronic Kidney Disease Stage 3 Patient's creatinine on arrival was 1.85 with a preoperative creatinine of 1.31.   Over the last 2 years, patient's creatinine has fluctuated between 1.1 and 1.4. Since hospital course patient has been unable to produce urine until 12/31/20 262ml with straight catheter following IVF bolus. Renal US reveals no mass or hydronephrosis of left or right kidney. Unremarkable appearance of the kidney and bladder. Today she produced 987ml with straight catheter. Bladder scan results 433. - Continue to monitor urine output;  -Repeat BMP BID - Urinalysis ordered - Bladder scan Q6H to assess bladder volume -Renal consult pending recommendation   # Hx of Atrial Fibrillation On metoprolol and Coumadin.  EKG on arrival shows sinus rhythm.  Coumadin is being held given hematoma development.  Metoprolol being held given hypotension. - Continue holding Metoprolol and Coumadin   # Hx of Hypertension  - On irbesartan and metoprolol at home.  Given hypotension on admission, will hold anti-hypertensives for now. Patient vital remain below goal ~130/80   # Chronic hypoxic respiratory failure  Multifactorial in the setting of asthma, idiopathic pulmonary hemosiderosis, ILD.  She is currently only on oxygen 2 L as needed at home.  Since arrival to the ED, patient has been placed on 2 L for comfort.  No hypoxic events noted. Patient has been saturation range above 93% - Continue supplemental 2 L oxygen PRN to maintain oxygen saturation > 92%   # HFpEF Patient is currently on Lasix daily. She notes that she is prone to volume overloading quickly, so we will need to monitor her fluid status closely. - Continue holding Lasix d/t hypovolemic state      Prior to Admission Living Arrangement: Anticipated Discharge Location: Barriers to Discharge: Dispo: Anticipated discharge in approximately 2-3 day(s).   Timothy Lasso, MD 01/01/2021, 6:43 AM Pager: [phone] After 5pm on weekdays and 1pm on weekends: On Call pager [phone]
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
## 2021-01-01 NOTE — Progress Notes (Signed)
Patient ID: [name], female   DOB: 04/25/1961, 60 y.o.   MRN: [id number]  Would not load when coumadin is restarted, would not give heparin bridge. She will start bleeding again .  She is at risk with mechanical valve for stroke with clot but currently the blood thinners has caused her to need multiple transfusions and hypotension. If her hematoma starts draining she is at risk for infection. Would wait a couple days then restart coumadin normal dose without loading. She will gradually get back therapeutic range after a few days.  She should use a walker whenever she gets up for several weeks and best to be less active.

## 2021-01-01 NOTE — Progress Notes (Signed)
Progress Note  Patient Name: [name] Date of Encounter: 01/01/2021  [facility] Cardiologist: Belva Crome III, MD   Subjective   Clinically improved, less flank pain.  Denies chest pain or shortness of breath.  Inpatient Medications    Scheduled Meds:  amoxicillin  500 mg Oral Q12H   citalopram  20 mg Oral Daily   famotidine  20 mg Oral Daily   mouth rinse  15 mL Mouth Rinse BID   sodium chloride flush  3 mL Intravenous Q12H   Continuous Infusions:  sodium chloride Stopped (12/30/20 0711)   PRN Meds: acetaminophen, HYDROmorphone (DILAUDID) injection, ondansetron **OR** ondansetron (ZOFRAN) IV   Vital Signs    Vitals:   01/01/21 0147 01/01/21 0310 01/01/21 0751 01/01/21 1048  BP: 122/69 112/69 (!) 131/54 106/68  Pulse: 92 96 85 93  Resp: (!) 22 19 20 17   Temp: 98.8 F (37.1 C) 98.4 F (36.9 C) 98.3 F (36.8 C) 98.5 F (36.9 C)  TempSrc: Oral Oral Oral Oral  SpO2: 94% 93% 98% 95%  Weight:  (!) 139.3 kg    Height:        Intake/Output Summary (Last 24 hours) at 01/01/2021 1106 Last data filed at 01/01/2021 0929 Gross per 24 hour  Intake 2441 ml  Output 975 ml  Net 1466 ml   Last 3 Weights 01/01/2021 12/31/2020 12/30/2020  Weight (lbs) 307 lb 1.6 oz 300 lb 7.8 oz 300 lb  Weight (kg) 139.3 kg 136.3 kg 136.079 kg      Telemetry    SR - Personally Reviewed  ECG    No new tracing  Physical Exam   GEN: No acute distress.   Neck: No JVD Cardiac: RRR, crisp mechanical valve clicks, rubs, or gallops.  Respiratory: Clear to auscultation bilaterally. GI: Large left sided abd wall hematoma/significant bruising MS: No edema; No deformity. Neuro:  Nonfocal  Psych: Normal affect   Labs    High Sensitivity Troponin:  No results for input(s): TROPONINIHS in the last 720 hours.    Chemistry Recent Labs  Lab 12/30/20 0546 12/30/20 0559 12/31/20 0824 01/01/21 0452  NA 135 136 132* 131*  K 4.7 4.6 5.1 5.0  CL 101 101 100 100  CO2 23  --  24 21*   GLUCOSE 153* 158* 146* 120*  BUN 36* 33* 51* 51*  CREATININE 1.85* 1.90* 2.58* 2.17*  CALCIUM 9.1  --  8.1* 8.6*  PROT  --   --  5.2* 5.3*  ALBUMIN  --   --  2.6* 2.6*  AST  --   --  25 32  ALT  --   --  11 12  ALKPHOS  --   --  45 40  BILITOT  --   --  1.3* 1.7*  GFRNONAA 31*  --  21* 25*  ANIONGAP 11  --  8 10     Hematology Recent Labs  Lab 12/31/20 0824 12/31/20 2009 01/01/21 0452  WBC 20.2* 22.9* 21.2*  RBC 2.44* 2.04* 2.43*  HGB 7.8* 6.5* 7.7*  HCT 23.2* 19.7* 23.1*  MCV 95.1 96.6 95.1  MCH 32.0 31.9 31.7  MCHC 33.6 33.0 33.3  RDW 16.7* 17.0* 17.2*  PLT 215 225 222    BNPNo results for input(s): BNP, PROBNP in the last 168 hours.   DDimer No results for input(s): DDIMER in the last 168 hours.   Radiology    US RENAL  Result Date: 12/31/2020 CLINICAL DATA:  Low urine output EXAM:  RENAL / URINARY TRACT ULTRASOUND COMPLETE COMPARISON:  CT abdomen pelvis 12/30/2020 FINDINGS: Right Kidney: Renal measurements: 9.7 x 4.5 x 4.8 cm = volume: 111 mL. Echogenicity within normal limits. No mass or hydronephrosis visualized. Left Kidney: Renal measurements: 9.8 x 4.5 x 4.4 cm = volume: 102 mL. Echogenicity within normal limits. No mass or hydronephrosis visualized. Bladder: Appears normal for degree of bladder distention. Other: Intramuscular hematoma within left abdominal wall as seen on earlier CT. IMPRESSION: 1. Unremarkable appearance of the kidneys and urinary bladder. 2. Intramuscular hematoma within the left abdominal wall as seen on CT. Electronically Signed   By: Ulyses Jarred M.D.   On: 12/31/2020 20:13    Cardiac Studies   N/a   Patient Profile     60 y.o. female with a hx of valvular heart disease s/p MVR/AVR w/79mm mechanical mitral vale and 19 mm mechanical aortic valve, ILD, polycythemia, CKD stage II, HTN, HLD, OSA and morbid obesity who is being seen 12/30/2020 for the evaluation of bleeding/need for [facility] at the request of Dr. Jimmye Norman.  Assessment & Plan     Large left wall hematomas: in the setting of recent dental surgery with hip to maxilla grating. Now with significant anemia, Hgb of 6.3 on admission. Evaluated by ortho with recommendations for medical management given high risk (lovenox/coumadin bridging) -- received 4  units PRBCs, Hgb only up to 7.7.  Optimally would like to have hemoglobin in the 9 range. -- management per primary   S/p MVR/AVR: done in 2016 at [facility]. Has been on coumadin/bridge prior to surgery. Says she resumed her coumadin on 7/30. Very complicated scenario with high risk for stroke without OAC but significant blood loss anemia. At this time we are forced to stop anticoagulation until it is safe to resume. Needs to see a stable trend in Hgb. She is aware of the risk of stroke in this setting. -- INR 1. -- Her orthopedic surgery, restart Coumadin in several days without a load following hemoglobin and left flank wound carefully.     Hypotension/Shock: BP initially in the 82N systolic, but improved with IVFs and blood products while in the ED. Remains tenuous.  -- antihypertensives held   CKD stage II: baseline 1.2-1.3 -- 1.85>>1.9 on admission in the setting of hypovolemia, now up to 2.58. Concern for ATN. Management per primary -- daily BMET --Nephrology has seen, serum creatinine slowly improving.  Probably ATN from shock and third spacing.  For questions or updates, please contact [place] Please consult [email or web address] for contact info under        Signed, Quay Burow, MD  01/01/2021, 11:06 AM

## 2021-01-02 DIAGNOSIS — N179 Acute kidney failure, unspecified: Secondary | ICD-10-CM

## 2021-01-02 DIAGNOSIS — R571 Hypovolemic shock: Secondary | ICD-10-CM

## 2021-01-02 DIAGNOSIS — Z952 Presence of prosthetic heart valve: Secondary | ICD-10-CM | POA: Diagnosis not present

## 2021-01-02 DIAGNOSIS — R578 Other shock: Secondary | ICD-10-CM | POA: Diagnosis not present

## 2021-01-02 DIAGNOSIS — D62 Acute posthemorrhagic anemia: Secondary | ICD-10-CM | POA: Diagnosis not present

## 2021-01-02 LAB — CBC WITH DIFFERENTIAL/PLATELET
Abs Immature Granulocytes: 0.88 10*3/uL — ABNORMAL HIGH (ref 0.00–0.07)
Basophils Absolute: 0 10*3/uL (ref 0.0–0.1)
Basophils Relative: 0 %
Eosinophils Absolute: 0.1 10*3/uL (ref 0.0–0.5)
Eosinophils Relative: 1 %
HCT: 18.4 % — ABNORMAL LOW (ref 36.0–46.0)
Hemoglobin: 6 g/dL — CL (ref 12.0–15.0)
Immature Granulocytes: 5 %
Lymphocytes Relative: 7 %
Lymphs Abs: 1.2 10*3/uL (ref 0.7–4.0)
MCH: 32.3 pg (ref 26.0–34.0)
MCHC: 32.6 g/dL (ref 30.0–36.0)
MCV: 98.9 fL (ref 80.0–100.0)
Monocytes Absolute: 2 10*3/uL — ABNORMAL HIGH (ref 0.1–1.0)
Monocytes Relative: 12 %
Neutro Abs: 12.2 10*3/uL — ABNORMAL HIGH (ref 1.7–7.7)
Neutrophils Relative %: 75 %
Platelets: 222 10*3/uL (ref 150–400)
RBC: 1.86 MIL/uL — ABNORMAL LOW (ref 3.87–5.11)
RDW: 18 % — ABNORMAL HIGH (ref 11.5–15.5)
WBC: 16.4 10*3/uL — ABNORMAL HIGH (ref 4.0–10.5)
nRBC: 1.7 % — ABNORMAL HIGH (ref 0.0–0.2)

## 2021-01-02 LAB — COMPREHENSIVE METABOLIC PANEL
ALT: 12 U/L (ref 0–44)
AST: 27 U/L (ref 15–41)
Albumin: 2.3 g/dL — ABNORMAL LOW (ref 3.5–5.0)
Alkaline Phosphatase: 49 U/L (ref 38–126)
Anion gap: 8 (ref 5–15)
BUN: 56 mg/dL — ABNORMAL HIGH (ref 8–23)
CO2: 25 mmol/L (ref 22–32)
Calcium: 8.4 mg/dL — ABNORMAL LOW (ref 8.9–10.3)
Chloride: 98 mmol/L (ref 98–111)
Creatinine, Ser: 1.92 mg/dL — ABNORMAL HIGH (ref 0.44–1.00)
GFR, Estimated: 29 mL/min — ABNORMAL LOW (ref >60)
Glucose, Bld: 99 mg/dL (ref 70–99)
Potassium: 5.7 mmol/L — ABNORMAL HIGH (ref 3.5–5.1)
Sodium: 131 mmol/L — ABNORMAL LOW (ref 135–145)
Total Bilirubin: 1.8 mg/dL — ABNORMAL HIGH (ref 0.3–1.2)
Total Protein: 5.3 g/dL — ABNORMAL LOW (ref 6.5–8.1)

## 2021-01-02 LAB — CBC
HCT: 23.9 % — ABNORMAL LOW (ref 36.0–46.0)
Hemoglobin: 7.8 g/dL — ABNORMAL LOW (ref 12.0–15.0)
MCH: 30.7 pg (ref 26.0–34.0)
MCHC: 32.6 g/dL (ref 30.0–36.0)
MCV: 94.1 fL (ref 80.0–100.0)
Platelets: 218 10*3/uL (ref 150–400)
RBC: 2.54 MIL/uL — ABNORMAL LOW (ref 3.87–5.11)
RDW: 17.2 % — ABNORMAL HIGH (ref 11.5–15.5)
WBC: 13.8 10*3/uL — ABNORMAL HIGH (ref 4.0–10.5)
nRBC: 1.5 % — ABNORMAL HIGH (ref 0.0–0.2)

## 2021-01-02 LAB — URINALYSIS, ROUTINE W REFLEX MICROSCOPIC
Bilirubin Urine: NEGATIVE
Glucose, UA: NEGATIVE mg/dL
Hgb urine dipstick: NEGATIVE
Ketones, ur: NEGATIVE mg/dL
Nitrite: NEGATIVE
Protein, ur: NEGATIVE mg/dL
Specific Gravity, Urine: 1.016 (ref 1.005–1.030)
pH: 5 (ref 5.0–8.0)

## 2021-01-02 LAB — LACTIC ACID, PLASMA
Lactic Acid, Venous: 0.8 mmol/L (ref 0.5–1.9)
Lactic Acid, Venous: 0.9 mmol/L (ref 0.5–1.9)

## 2021-01-02 LAB — PROTIME-INR
INR: 1.2 (ref 0.8–1.2)
Prothrombin Time: 15.3 seconds — ABNORMAL HIGH (ref 11.4–15.2)

## 2021-01-02 LAB — PREPARE RBC (CROSSMATCH)

## 2021-01-02 MED ORDER — PHYTONADIONE 5 MG PO TABS
5.0000 mg | ORAL_TABLET | Freq: Once | ORAL | Status: AC
  Administered 2021-01-02: 5 mg via ORAL
  Filled 2021-01-02 (×2): qty 1

## 2021-01-02 MED ORDER — SODIUM ZIRCONIUM CYCLOSILICATE 10 G PO PACK
10.0000 g | PACK | Freq: Three times a day (TID) | ORAL | Status: DC
  Administered 2021-01-02 (×2): 10 g via ORAL
  Filled 2021-01-02 (×2): qty 1

## 2021-01-02 MED ORDER — SODIUM CHLORIDE 0.9 % IV SOLN: INTRAVENOUS | Status: DC

## 2021-01-02 MED ORDER — POTASSIUM CHLORIDE CRYS ER 20 MEQ PO TBCR: 40.0000 meq | EXTENDED_RELEASE_TABLET | Freq: Two times a day (BID) | ORAL | Status: DC

## 2021-01-02 MED ORDER — LACTATED RINGERS IV BOLUS
1000.0000 mL | Freq: Once | INTRAVENOUS | Status: AC
  Administered 2021-01-02: 1000 mL via INTRAVENOUS

## 2021-01-02 MED ORDER — SODIUM CHLORIDE 0.9% IV SOLUTION: Freq: Once | INTRAVENOUS | Status: AC

## 2021-01-02 MED ORDER — SODIUM ZIRCONIUM CYCLOSILICATE 10 G PO PACK
10.0000 g | PACK | Freq: Once | ORAL | Status: AC
  Administered 2021-01-02: 10 g via ORAL
  Filled 2021-01-02: qty 1

## 2021-01-02 MED ORDER — PHYTONADIONE 5 MG PO TABS: 2.5000 mg | ORAL_TABLET | Freq: Once | ORAL | Status: DC

## 2021-01-02 NOTE — Plan of Care (Signed)

## 2021-01-02 NOTE — Progress Notes (Signed)
Patient reevaluated at bedside, fluctuating maps however, after 2U of PRBC manual blood pressure taken and new cuff placed on wrist, MAPs greater than 75 with two consistent readings. Will stop continuous IV fluids at this, she is currently receiving 2U of FFP. On exam patient is not hypervolemic. Will continue to monitor pressures as well as volume status. If unable to consistently keep MAP greater than 65, low threshold to consult PCCM.

## 2021-01-02 NOTE — Progress Notes (Signed)
Progress Note  Patient Name: [name] Date of Encounter: 01/02/2021  [facility] Cardiologist: Belva Crome III, MD   Subjective   Clinically improved, less flank pain.  Denies chest pain or shortness of breath.  Inpatient Medications    Scheduled Meds:  sodium chloride   Intravenous Once   citalopram  20 mg Oral Daily   famotidine  20 mg Oral Daily   mouth rinse  15 mL Mouth Rinse BID   sodium chloride flush  3 mL Intravenous Q12H   sodium zirconium cyclosilicate  10 g Oral TID   Continuous Infusions:  sodium chloride 100 mL/hr at 01/02/21 0925   PRN Meds: acetaminophen, HYDROmorphone (DILAUDID) injection, ondansetron **OR** ondansetron (ZOFRAN) IV   Vital Signs    Vitals:   01/01/21 2327 01/02/21 0338 01/02/21 0843 01/02/21 0951  BP: (!) 91/42 (!) 98/56    Pulse: 90 83  87  Resp: 17 19  18   Temp: 98 F (36.7 C) 98.5 F (36.9 C)  98.5 F (36.9 C)  TempSrc: Oral Oral  Oral  SpO2: 90% 100% 94% 96%  Weight:  (!) 139.3 kg    Height:        Intake/Output Summary (Last 24 hours) at 01/02/2021 1024 Last data filed at 01/02/2021 0925 Gross per 24 hour  Intake 792.66 ml  Output 670 ml  Net 122.66 ml   Last 3 Weights 01/02/2021 01/01/2021 12/31/2020  Weight (lbs) 307 lb 1.6 oz 307 lb 1.6 oz 300 lb 7.8 oz  Weight (kg) 139.3 kg 139.3 kg 136.3 kg      Telemetry    SR - Personally Reviewed  ECG    No new tracing  Physical Exam   GEN: No acute distress.   Neck: No JVD Cardiac: RRR, crisp mechanical valve clicks, rubs, or gallops.  Respiratory: Clear to auscultation bilaterally. GI: Large left sided abd wall hematoma/significant bruising MS: No edema; No deformity. Neuro:  Nonfocal  Psych: Normal affect   Labs    High Sensitivity Troponin:  No results for input(s): TROPONINIHS in the last 720 hours.    Chemistry Recent Labs  Lab 12/31/20 0824 01/01/21 0452 01/02/21 0523  NA 132* 131* 131*  K 5.1 5.0 5.7*  CL 100 100 98  CO2 24 21* 25   GLUCOSE 146* 120* 99  BUN 51* 51* 56*  CREATININE 2.58* 2.17* 1.92*  CALCIUM 8.1* 8.6* 8.4*  PROT 5.2* 5.3* 5.3*  ALBUMIN 2.6* 2.6* 2.3*  AST 25 32 27  ALT 11 12 12   ALKPHOS 45 40 49  BILITOT 1.3* 1.7* 1.8*  GFRNONAA 21* 25* 29*  ANIONGAP 8 10 8      Hematology Recent Labs  Lab 01/01/21 0452 01/01/21 1724 01/02/21 0523  WBC 21.2* 21.2* 16.4*  RBC 2.43* 2.25* 1.86*  HGB 7.7* 7.2* 6.0*  HCT 23.1* 21.7* 18.4*  MCV 95.1 96.4 98.9  MCH 31.7 32.0 32.3  MCHC 33.3 33.2 32.6  RDW 17.2* 17.6* 18.0*  PLT 222 237 222    BNPNo results for input(s): BNP, PROBNP in the last 168 hours.   DDimer No results for input(s): DDIMER in the last 168 hours.   Radiology    US RENAL  Result Date: 12/31/2020 CLINICAL DATA:  Low urine output EXAM: RENAL / URINARY TRACT ULTRASOUND COMPLETE COMPARISON:  CT abdomen pelvis 12/30/2020 FINDINGS: Right Kidney: Renal measurements: 9.7 x 4.5 x 4.8 cm = volume: 111 mL. Echogenicity within normal limits. No mass or hydronephrosis visualized. Left Kidney: Renal  measurements: 9.8 x 4.5 x 4.4 cm = volume: 102 mL. Echogenicity within normal limits. No mass or hydronephrosis visualized. Bladder: Appears normal for degree of bladder distention. Other: Intramuscular hematoma within left abdominal wall as seen on earlier CT. IMPRESSION: 1. Unremarkable appearance of the kidneys and urinary bladder. 2. Intramuscular hematoma within the left abdominal wall as seen on CT. Electronically Signed   By: Ulyses Jarred M.D.   On: 12/31/2020 20:13    Cardiac Studies   N/a   Patient Profile     60 y.o. female with a hx of valvular heart disease s/p MVR/AVR w/56mm mechanical mitral vale and 19 mm mechanical aortic valve, ILD, polycythemia, CKD stage II, HTN, HLD, OSA and morbid obesity who is being seen 12/30/2020 for the evaluation of bleeding/need for [facility] at the request of Dr. Jimmye Norman.  Assessment & Plan    Large left wall hematomas: in the setting of recent dental surgery  with hip to maxilla grating. Now with significant anemia, Hgb of 6.3 on admission. Evaluated by ortho with recommendations for medical management given high risk (lovenox/coumadin bridging) -- received 4  units PRBCs, hemoglobin down to 6 this morning.  Plan for several units of additional packed red blood cells.  Optimally would like to have hemoglobin in the 9 range. -- management per primary   S/p MVR/AVR: done in 2016 at [facility]. Has been on coumadin/bridge prior to surgery. Says she resumed her coumadin on 7/30. Very complicated scenario with high risk for stroke without OAC but significant blood loss anemia. At this time we are forced to stop anticoagulation until it is safe to resume. Needs to see a stable trend in Hgb. She is aware of the risk of stroke in this setting. -- INR 1.2 -- Her orthopedic surgery, restart Coumadin in several days without a load following hemoglobin and left flank wound carefully.     Hypotension/Shock: BP initially in the 91Q systolic, but improved with IVFs and blood products while in the ED. Remains tenuous.  -- antihypertensives held   CKD stage II: baseline 1.2-1.3 -- 1.85>>1.9 on admission in the setting of hypovolemia, up to 2.58. Concern for ATN. Management per primary.  Hemoglobin down to 1.92 today. -- daily BMET --Nephrology has seen, serum creatinine slowly improving.  Probably ATN from shock and third spacing.   Stable from a cardiology point of view.  Major issue is going to be continued bleeding from her left flank harvest site.  Dr. Lorin Mercy recommended given FFP or vitamin K.  INR is only 1.2.  Agree with transfusion to hemoglobin in the 8-9 range if possible.  Restart Coumadin when hemoglobin stable without a load.  We will see again on Monday.  For questions or updates, please contact [place] Please consult [email or web address] for contact info under        Signed, Quay Burow, MD  01/02/2021, 10:24 AM

## 2021-01-02 NOTE — Progress Notes (Signed)
Patient reevaluated at bedside after having received 1 L bolus of LR and almost finished 1st unit of PRBC. Denies any new symptoms at this time.   She's receiving continuous IV NS at 250 cc's per hour. MAPS have been fluctuating 50's to 60's. I suspect once patient receives PRBC, vitamin K, and FFP she will no longer need maintenance fluids. We will continue to monitor her fluid status and DC fluids once map is consistently above 65. Repeat H/H planned for 2 hours post transfusion. Will give additional PRBC as needed.

## 2021-01-02 NOTE — Progress Notes (Addendum)
Patient ID: [name], female   DOB: 08/29/1960, 60 y.o.   MRN: [id number] [facility] Progress Note   Assessment/ Plan:   1. Acute kidney Injury on chronic kidney disease stage IIIa: Nonoliguric and appears to be likely from hemodynamically mediated ATN.  Creatinine improving on labs this morning with worsening /Hyperkalemia likely from hematoma resorption.  We will treat hyperkalemia medically. 2.  Hyperkalemia: Secondary to combination of acute kidney injury and hematoma resorption.  Irbesartan appropriately on hold.  I will put her on scheduled Lokelma anticipating K loading from PRBCs.  3.  Hemorrhagic shock/acute blood loss anemia: Secondary to large left lateral abdominal wall harvest site.  With low hemoglobin and the setting of hypotension.  Recommend transfusing her with 2 units of packed red blood cells as this would be the optimal replacement fluid for volume/anemia/hypotension. 4. Hyponatremia: secondary to AKI and impaired free water handling- monitor with increased UOP and renal recovery.  5. Atrial fibrillation: metoprolol held (hypotension) and coumadin on hold (hematoma).   Subjective:   Reports to be feeling rough.  "My blood pressure will not stay up".   Objective:   BP (!) 98/56 (BP Location: Left Arm)   Pulse 87   Temp 98.5 F (36.9 C) (Oral)   Resp 18   Ht 5\' 6"  (1.676 m)   Wt (!) 139.3 kg   SpO2 96%   BMI 49.57 kg/m   Intake/Output Summary (Last 24 hours) at 01/02/2021 6294 Last data filed at 01/02/2021 7654 Gross per 24 hour  Intake 792.66 ml  Output 670 ml  Net 122.66 ml   Weight change: 0 kg  Physical Exam: Gen: Comfortable resting in bed, significant ecchymosis noted around face/neck CVS: Pulse regular rhythm, normal rate, S1 and S2 with ejection systolic murmur Resp: Clear to auscultation bilaterally, no distinct rales or rhonchi Abd: Soft, obese, nontender, bowel sounds normal Ext: Trace lower extremity edema  Imaging: US  RENAL  Result Date: 12/31/2020 CLINICAL DATA:  Low urine output EXAM: RENAL / URINARY TRACT ULTRASOUND COMPLETE COMPARISON:  CT abdomen pelvis 12/30/2020 FINDINGS: Right Kidney: Renal measurements: 9.7 x 4.5 x 4.8 cm = volume: 111 mL. Echogenicity within normal limits. No mass or hydronephrosis visualized. Left Kidney: Renal measurements: 9.8 x 4.5 x 4.4 cm = volume: 102 mL. Echogenicity within normal limits. No mass or hydronephrosis visualized. Bladder: Appears normal for degree of bladder distention. Other: Intramuscular hematoma within left abdominal wall as seen on earlier CT. IMPRESSION: 1. Unremarkable appearance of the kidneys and urinary bladder. 2. Intramuscular hematoma within the left abdominal wall as seen on CT. Electronically Signed   By: Ulyses Jarred M.D.   On: 12/31/2020 20:13    Labs: BMET Recent Labs  Lab 12/26/20 1432 12/30/20 0546 12/30/20 0559 12/31/20 0824 01/01/21 0452 01/02/21 0523  NA  --  135 136 132* 131* 131*  K  --  4.7 4.6 5.1 5.0 5.7*  CL  --  101 101 100 100 98  CO2  --  23  --  24 21* 25  GLUCOSE  --  153* 158* 146* 120* 99  BUN  --  36* 33* 51* 51* 56*  CREATININE 1.52* 1.85* 1.90* 2.58* 2.17* 1.92*  CALCIUM  --  9.1  --  8.1* 8.6* 8.4*   CBC Recent Labs  Lab 12/30/20 0546 12/30/20 0559 12/31/20 2009 01/01/21 0452 01/01/21 1724 01/02/21 0523  WBC 17.2*   < > 22.9* 21.2* 21.2* 16.4*  NEUTROABS 13.5*  --   --  15.9* 16.1* 12.2*  HGB 6.3*   < > 6.5* 7.7* 7.2* 6.0*  HCT 20.9*   < > 19.7* 23.1* 21.7* 18.4*  MCV 104.0*   < > 96.6 95.1 96.4 98.9  PLT 261   < > 225 222 237 222   < > = values in this interval not displayed.    Medications:     sodium chloride   Intravenous Once   citalopram  20 mg Oral Daily   famotidine  20 mg Oral Daily   mouth rinse  15 mL Mouth Rinse BID   sodium chloride flush  3 mL Intravenous Q12H   Elmarie Shiley, MD 01/02/2021, 9:58 AM

## 2021-01-02 NOTE — Progress Notes (Addendum)
HD#3 Subjective:  Overnight Events: Hgb of 6, Hypotensive  Upon bedside evaluation patient found to be resting comfortably in bed. She denied any chest pain, shortness of breath. Notes little urinary output over the past day. Denies noticing worsening of her ecchymosis. RN at bedside and believes ecchymosis has worsened over past 24 hours.   Objective:  Vital signs in last 24 hours: Vitals:   01/01/21 1934 01/01/21 2327 01/02/21 0338 01/02/21 0843  BP: (!) 109/52 (!) 91/42 (!) 98/56   Pulse: 94 90 83   Resp: 19 17 19    Temp: 98.4 F (36.9 C) 98 F (36.7 C) 98.5 F (36.9 C)   TempSrc: Oral Oral Oral   SpO2: 98% 90% 100% 94%  Weight:   (!) 139.3 kg   Height:       Supplemental O2: Nasal Cannula SpO2: 94 % O2 Flow Rate (L/min): 2 L/min   Physical Exam:  Constitutional: No acute distress HENT: normocephalic atraumatic, bilateral ecchymosis of lower jaw and anterior neck.  Eyes: conjunctiva non-erythematous. Bilateral periorbital ecchymosis Neck: supple Cardiovascular: regular rate and rhythm, crips mechanical valve sound. No gallops or rubs. SCD's placed Pulmonary/Chest: normal work of breathing on 2L, lungs clear to auscultation bilaterally Abdominal: soft, non distended, left quadrant and left flank ecchymosis.  MSK: normal bulk and tone Neurological: alert & oriented x 3 Skin: warm and dry Psych: Normal mood and thought process  Filed Weights   12/31/20 0428 01/01/21 0310 01/02/21 0338  Weight: (!) 136.3 kg (!) 139.3 kg (!) 139.3 kg     Intake/Output Summary (Last 24 hours) at 01/02/2021 0921 Last data filed at 01/02/2021 0448 Gross per 24 hour  Intake 792.66 ml  Output 725 ml  Net 67.66 ml   Net IO Since Admission: 5,498.66 mL [01/02/21 0921]  Pertinent Labs: CBC Latest Ref Rng & Units 01/02/2021 01/01/2021 01/01/2021  WBC 4.0 - 10.5 K/uL 16.4(H) 21.2(H) 21.2(H)  Hemoglobin 12.0 - 15.0 g/dL 6.0(LL) 7.2(L) 7.7(L)  Hematocrit 36.0 - 46.0 % 18.4(L) 21.7(L) 23.1(L)   Platelets 150 - 400 K/uL 222 237 222    CMP Latest Ref Rng & Units 01/02/2021 01/01/2021 12/31/2020  Glucose 70 - 99 mg/dL 99 120(H) 146(H)  BUN 8 - 23 mg/dL 56(H) 51(H) 51(H)  Creatinine 0.44 - 1.00 mg/dL 1.92(H) 2.17(H) 2.58(H)  Sodium 135 - 145 mmol/L 131(L) 131(L) 132(L)  Potassium 3.5 - 5.1 mmol/L 5.7(H) 5.0 5.1  Chloride 98 - 111 mmol/L 98 100 100  CO2 22 - 32 mmol/L 25 21(L) 24  Calcium 8.9 - 10.3 mg/dL 8.4(L) 8.6(L) 8.1(L)  Total Protein 6.5 - 8.1 g/dL 5.3(L) 5.3(L) 5.2(L)  Total Bilirubin 0.3 - 1.2 mg/dL 1.8(H) 1.7(H) 1.3(H)  Alkaline Phos 38 - 126 U/L 49 40 45  AST 15 - 41 U/L 27 32 25  ALT 0 - 44 U/L 12 12 11    Imaging: No results found.  Assessment/Plan:   Active Problems:   Hypovolemic shock (HCC)  Patient Summary: [name] is a 60 y.o. with a pertinent PMHx of valvular heart disease s/p MVR/AVR (2016) was on Coumadin, paroxysmal atrial fibrillation, HFpEF, idiopathic pulmonary hemosiderosis, ILD, chronic hypoxic respiratory failure on home oxygen PRN only, CKD Stage 3, who presented with left hip pain and bruising of her face and abdomen s/p maxillary bone graft for dental reconstruction and admitted for hypovolemic shock and intra-abdominal wall hematomas.   # Hypovolemic Shock 2/2 Large Abdominal Hematoma Hematoma secondary to recent dental reconstruction with maxillary bone grafting. This  morning patient found to be hypotensive and with hemoglobin of 6.0. Given bolus of LR and 2U PRBC ordered. Difficult time placing second IV line, consult placed to IV team. Pressures improved to 128/53. INR improved to 1.2, but continuing to bleed. Discussed with Dr. Lorin Mercy of orthopedics and Dr. Gwenlyn Found of cardiology, both recommending FFP/vitamin K. Will continue to stabilize and give IV fluids/blood as needed. Bleeding risk continues to outweigh risk of clotting. Continue medical management per orthopedics.  -completed LR Bolus, Maintenance NS 100cc per hour -pending second IV  placement and 2U PRBC, suspect she will need additional units -vitamin K 2.5 mg oral ordered, FFP pending -hold anticoagunts until hgb stable and over 9.  -maintain MAP>65, cycle BP frequently -lactic acid pending -repeat H/H this afternoon -orthopedic surgery/cardiology following; appreciate their recommendations  - SCD's placed  # Non-Oliguric Acute Kidney Injury 2/2 hypovolemic shock # Ckd Stg 3a Baseline Cr of 1.2-1.5. Cr improving to 1.9. UA without casts. Hypotensive and anemic this am. Patient without urinary output however bladder scan this am 400 cc. Will in/out cath. If patient with recurrent urinary retention can consider foley placement. Suspect patient will have improvement of AKI with continue fluid resuscitation. Nephrology following, appreciate their assistance in her care.  -daily bmp -maintain MAP>65 as per above -in/out catheters as needed, may need foley placed -UA w/o casts, hematuria, or proteinuria.  -continue to hold arb  #Hyperkalemia In setting of multiple blood transfusions and AKI. Anticipate multiple blood transfusions, will more than likely need continued lokelma dosing. Nephrology following, appreciate their recommendations -lokelma given this am -daily BMP -continue to monitor  # Aortic Stenosis s/p mechanical AVR # Mitral Stenosis s/p mechanical MVR Long-standing anti-coagulation with Coumadin. While she is high risk, will continue to hold anti-coagulant in setting of anemia and hemodynamic instability. Cardiology following, appreciate their assistance -continue to hold anticoagulation -recommendations per cardiology   # Hx of Atrial Fibrillation Continues to be in sinus rhythm. -continue holding Metoprolol and Coumadin   # Hx of Hypertension  Hypotensive, continue to hold home anti-hypertensive's.   # Chronic hypoxic respiratory failure  Multifactorial in the setting of asthma, idiopathic pulmonary hemosiderosis, ILD. PRN O2 at home. Currently  on 2L since admission, without episodes of hypoxemia or shortness of breath.  - Continue supplemental oxygen PRN to maintain oxygen saturation > 90%   # HFpEF Last EF of 55-60%, echo 07/2019. Euvolemic on exam. Continue to hold lasix in setting of hypotension. Will monitor fluid status closely as she is receiving multiple units of blood and IV fluids since admission. Cardiology following.  -hold lasix -strict I/O -daily weights   Diet: Heart Healthy IVF: LR,Bolus, 100 cc NS infusion VTE: SCDs Code: Full PT/OT recs: None at this time TOC recs: None  Family Update: Spoke with patient's daughter on the phone this morning, updated on plan to give fluids and 2U of blood  Dispo: Anticipated discharge to Home in 3-4 days pending further stabilization of her acute bleed.   Sanjuana Letters DO Internal Medicine Resident PGY-2 Pager [phone] Please contact the on call pager after 5 pm and on weekends at [phone].

## 2021-01-02 NOTE — Care Management Important Message (Signed)
Important Message  Patient Details  Name: ANAROSA KUBISIAK MRN: [id number] Date of Birth: 1960/09/09   Medicare Important Message Given:  Yes     Shelda Altes 01/02/2021, 8:18 AM

## 2021-01-02 NOTE — Progress Notes (Signed)
Labs reported a critical Hmg, will notified MD.

## 2021-01-03 DIAGNOSIS — Z952 Presence of prosthetic heart valve: Secondary | ICD-10-CM | POA: Diagnosis not present

## 2021-01-03 DIAGNOSIS — D6489 Other specified anemias: Secondary | ICD-10-CM

## 2021-01-03 DIAGNOSIS — Z9189 Other specified personal risk factors, not elsewhere classified: Secondary | ICD-10-CM | POA: Diagnosis not present

## 2021-01-03 DIAGNOSIS — S301XXA Contusion of abdominal wall, initial encounter: Secondary | ICD-10-CM | POA: Diagnosis not present

## 2021-01-03 LAB — TYPE AND SCREEN
ABO/RH(D): O POS
Antibody Screen: NEGATIVE
Unit division: 0
Unit division: 0
Unit division: 0
Unit division: 0
Unit division: 0
Unit division: 0

## 2021-01-03 LAB — CBC WITH DIFFERENTIAL/PLATELET
Abs Immature Granulocytes: 0.59 10*3/uL — ABNORMAL HIGH (ref 0.00–0.07)
Abs Immature Granulocytes: 0.55 10*3/uL — ABNORMAL HIGH (ref 0.00–0.07)
Basophils Absolute: 0 10*3/uL (ref 0.0–0.1)
Basophils Absolute: 0 10*3/uL (ref 0.0–0.1)
Basophils Relative: 0 %
Basophils Relative: 0 %
Eosinophils Absolute: 0.2 10*3/uL (ref 0.0–0.5)
Eosinophils Absolute: 0.3 10*3/uL (ref 0.0–0.5)
Eosinophils Relative: 2 %
Eosinophils Relative: 2 %
HCT: 21.2 % — ABNORMAL LOW (ref 36.0–46.0)
HCT: 22.3 % — ABNORMAL LOW (ref 36.0–46.0)
Hemoglobin: 7 g/dL — ABNORMAL LOW (ref 12.0–15.0)
Hemoglobin: 7.3 g/dL — ABNORMAL LOW (ref 12.0–15.0)
Immature Granulocytes: 5 %
Immature Granulocytes: 5 %
Lymphocytes Relative: 6 %
Lymphocytes Relative: 5 %
Lymphs Abs: 0.6 10*3/uL — ABNORMAL LOW (ref 0.7–4.0)
Lymphs Abs: 0.6 10*3/uL — ABNORMAL LOW (ref 0.7–4.0)
MCH: 31.3 pg (ref 26.0–34.0)
MCH: 31.5 pg (ref 26.0–34.0)
MCHC: 33 g/dL (ref 30.0–36.0)
MCHC: 32.7 g/dL (ref 30.0–36.0)
MCV: 94.6 fL (ref 80.0–100.0)
MCV: 96.1 fL (ref 80.0–100.0)
Monocytes Absolute: 1.4 10*3/uL — ABNORMAL HIGH (ref 0.1–1.0)
Monocytes Absolute: 1.3 10*3/uL — ABNORMAL HIGH (ref 0.1–1.0)
Monocytes Relative: 12 %
Monocytes Relative: 11 %
Neutro Abs: 8.5 10*3/uL — ABNORMAL HIGH (ref 1.7–7.7)
Neutro Abs: 9 10*3/uL — ABNORMAL HIGH (ref 1.7–7.7)
Neutrophils Relative %: 75 %
Neutrophils Relative %: 77 %
Platelets: 206 10*3/uL (ref 150–400)
Platelets: 222 10*3/uL (ref 150–400)
RBC: 2.24 MIL/uL — ABNORMAL LOW (ref 3.87–5.11)
RBC: 2.32 MIL/uL — ABNORMAL LOW (ref 3.87–5.11)
RDW: 17.8 % — ABNORMAL HIGH (ref 11.5–15.5)
RDW: 18.1 % — ABNORMAL HIGH (ref 11.5–15.5)
WBC: 11.3 10*3/uL — ABNORMAL HIGH (ref 4.0–10.5)
WBC: 11.6 10*3/uL — ABNORMAL HIGH (ref 4.0–10.5)
nRBC: 1.3 % — ABNORMAL HIGH (ref 0.0–0.2)
nRBC: 0.9 % — ABNORMAL HIGH (ref 0.0–0.2)

## 2021-01-03 LAB — BPAM RBC
Blood Product Expiration Date: 202208272359
Blood Product Expiration Date: 202208292359
Blood Product Expiration Date: 202209032359
Blood Product Expiration Date: 202209032359
Blood Product Expiration Date: 202209062359
Blood Product Expiration Date: 202209062359
ISSUE DATE / TIME: 202208020659
ISSUE DATE / TIME: 202208021037
ISSUE DATE / TIME: 202208030248
ISSUE DATE / TIME: 202208032232
ISSUE DATE / TIME: 202208051032
ISSUE DATE / TIME: 202208051315
Unit Type and Rh: 5100
Unit Type and Rh: 5100
Unit Type and Rh: 5100
Unit Type and Rh: 5100
Unit Type and Rh: 5100
Unit Type and Rh: 5100

## 2021-01-03 LAB — COMPREHENSIVE METABOLIC PANEL
ALT: 18 U/L (ref 0–44)
AST: 33 U/L (ref 15–41)
Albumin: 2.5 g/dL — ABNORMAL LOW (ref 3.5–5.0)
Alkaline Phosphatase: 50 U/L (ref 38–126)
Anion gap: 6 (ref 5–15)
BUN: 49 mg/dL — ABNORMAL HIGH (ref 8–23)
CO2: 26 mmol/L (ref 22–32)
Calcium: 8.5 mg/dL — ABNORMAL LOW (ref 8.9–10.3)
Chloride: 100 mmol/L (ref 98–111)
Creatinine, Ser: 1.42 mg/dL — ABNORMAL HIGH (ref 0.44–1.00)
GFR, Estimated: 42 mL/min — ABNORMAL LOW (ref >60)
Glucose, Bld: 91 mg/dL (ref 70–99)
Potassium: 4.7 mmol/L (ref 3.5–5.1)
Sodium: 132 mmol/L — ABNORMAL LOW (ref 135–145)
Total Bilirubin: 2.4 mg/dL — ABNORMAL HIGH (ref 0.3–1.2)
Total Protein: 5.7 g/dL — ABNORMAL LOW (ref 6.5–8.1)

## 2021-01-03 LAB — BPAM FFP
Blood Product Expiration Date: 202208052359
Blood Product Expiration Date: 202208052359
ISSUE DATE / TIME: 202208051607
ISSUE DATE / TIME: 202208052031
Unit Type and Rh: 6200
Unit Type and Rh: 6200

## 2021-01-03 LAB — PREPARE FRESH FROZEN PLASMA
Unit division: 0
Unit division: 0

## 2021-01-03 LAB — PROTIME-INR
INR: 1 (ref 0.8–1.2)
Prothrombin Time: 12.9 seconds (ref 11.4–15.2)

## 2021-01-03 MED ORDER — HYDROMORPHONE HCL 2 MG PO TABS
1.0000 mg | ORAL_TABLET | ORAL | Status: DC | PRN
  Administered 2021-01-03 – 2021-01-06 (×6): 1 mg via ORAL
  Filled 2021-01-03 (×7): qty 1

## 2021-01-03 MED ORDER — CHLORHEXIDINE GLUCONATE CLOTH 2 % EX PADS
6.0000 | MEDICATED_PAD | Freq: Every day | CUTANEOUS | Status: DC
  Administered 2021-01-04 – 2021-01-09 (×4): 6 via TOPICAL

## 2021-01-03 NOTE — Progress Notes (Addendum)
Subjective: I seen and evaluated [name] at bedside.  She was lying comfortably in bed.  She states that she is feeling fine.  She states that she feels the urge to urinate, but unable to produce urine.  She understands and acknowledges the use of placing a Foley.  She denies lightheadedness/dizziness, dysphagia, chest pain, SOB, or lower extremity pain.  She expresses slight discomfort in her abdomen, but improved from yesterday.  She states she will attempt to get up and move around with the assistance of the nurse today.  Objective:  Vital signs in last 24 hours: Vitals:   01/03/21 0022 01/03/21 0401 01/03/21 0431 01/03/21 0756  BP: 140/90 (!) 146/53  (!) 111/55  Pulse: 85 90  78  Resp: 19 20  18   Temp: 98.3 F (36.8 C) 98.3 F (36.8 C)  98 F (36.7 C)  TempSrc: Oral Oral  Oral  SpO2: 93% 93%  96%  Weight:   (!) 139.4 kg   Height:       Physical Exam HENT:     Head: Normocephalic and atraumatic.  Cardiovascular:     Rate and Rhythm: Normal rate and regular rhythm.     Heart sounds: Murmur heard.  Pulmonary:     Effort: Pulmonary effort is normal.     Breath sounds: Normal breath sounds.  Abdominal:     General: There is distension.     Tenderness: There is abdominal tenderness in the left lower quadrant.     Comments: Swelling and bruising noted on the LLQ of the abdomin that extends up the axilla. Tenderness with palpation of the left flank.  Musculoskeletal:     Right lower leg: Edema present.  Skin:    General: Skin is warm and dry.  Neurological:     General: No focal deficit present.     Mental Status: She is alert.  Psychiatric:        Behavior: Behavior normal. Behavior is cooperative.     Assessment/Plan:  Principal Problem:   Hematoma of abdominal wall Active Problems:   History of mitral valve replacement with mechanical valve   Chronic diastolic heart failure (HCC)   Acute kidney injury superimposed on CKD (HCC)   Hypovolemic shock  (HCC)  Large Abdominal Wall Hematoma Hematoma secondary to recent dental reconstruction with maxillary bone grafting from iliac crest. Current hgb-7; PT/INR 12.7/1.0. Yesterday(01/02/21) she received 2U PRBC, 2U FFP, vitamin K 2.5mg  oral due to hgb falling below 7. Patient also received maintenance NS 100cc per hour. Lactic acid .9 WNL. -repeat H/H this afternoon -orthopedic surgery/cardiology following; appreciate their recommendations  -oral dilaudid ordered for pain control   # Non-Oliguric Acute Kidney Injury 2/2 hypovolemic shock # Ckd Stg 3a # Urinary Retention Baseline Cr of 1.2-1.5. Cr improving to 1.42 today, down from 1.92. UA without casts. Hypotensive and anemic this am. Straight cath this morning produced 742ml of urine. Patient reports feeling urinary urgency but unable to urinate. Urine retention likely related to acute illness and opioids, but at risk for bladder stretch injury due to large residual. -Foley placed today. Can try voiding trial in a few days prior to discharge. -daily bmp -continue to hold arb -Nephrology following, appreciate their assistance in her care.   #Hyperkalemia In setting of multiple blood transfusions and AKI. Anticipate multiple blood transfusions, will more than likely need continued lokelma dosing. Given lokelma yesterday (01/02/21). Current potassium level 4.7 down from 5.7. Nephrology following, appreciate their recommendations -daily BMP -continue to monitor   #  Aortic Stenosis s/p mechanical AVR # Mitral Stenosis s/p mechanical MVR Long-standing anti-coagulation with Coumadin. While she is high risk, will continue to hold anti-coagulant in setting of active bleeding.  -will restart coumadin when bleeding stops, probably a few more days   # Hx of Atrial Fibrillation Continues to be in sinus rhythm. -continue holding Metoprolol and Coumadin   # Hx of Hypertension  Hypotensive, continue to hold home anti-hypertensive's.   # Chronic hypoxic  respiratory failure  Multifactorial in the setting of asthma, idiopathic pulmonary hemosiderosis, ILD. PRN O2 at home. Currently on 2L since admission, without episodes of hypoxemia or shortness of breath.  - Continue supplemental oxygen PRN to maintain oxygen saturation > 90%   # Chronic HFpEF Last EF of 55-60%, echo 07/2019. Euvolemic on exam. Continue to hold lasix in setting of hypotension. Will monitor fluid status closely as she is receiving multiple units of blood and IV fluids since admission. Cardiology following. -hold lasix -strict I/O -daily weights   Diet: Heart Healthy IVF: LR,Bolus, 100 cc NS infusion VTE: SCDs Code: Full PT/OT recs: None at this time TOC recs: None           Prior to Admission Living Arrangement: Anticipated Discharge Location: Barriers to Discharge: Dispo: Anticipated discharge in approximately 3-5 day(s).   Timothy Lasso, MD 01/03/2021, 11:00 AM Pager: [phone] After 5pm on weekdays and 1pm on weekends: On Call pager [phone]

## 2021-01-03 NOTE — Progress Notes (Signed)
Patient ID: [name], female   DOB: [birth year], 61 y.o.   MRN: [id number] [facility] Progress Note   Assessment/ Plan:   1. Acute kidney Injury on chronic kidney disease stage IIIa: Nonoliguric and appears to be likely from hemodynamically mediated ATN.  Renal function continues to improve with downtrending BUN/creatinine seen on labs.  Suspect that there will be a lag in improvement of azotemia as hematoma continues to resorb. 2.  Hyperkalemia: Secondary to combination of acute kidney injury and hematoma resorption.  Corrected overnight with Lokelma-continue to trend labs and intervene when indicated.  ARB on hold. 3.  Hemorrhagic shock/acute blood loss anemia: Secondary to large left lateral abdominal wall harvest site.  With low hemoglobin and the setting of hypotension.  Recommend transfusing her with 2 units of packed with blood cells as this would be the optimal replacement fluid for volume/anemia/hypotension. 4. Hyponatremia: secondary to AKI and impaired free water handling- monitor with increased UOP and renal recovery.  5. Atrial fibrillation: metoprolol held (hypotension) and coumadin on hold (hematoma).   Nephrology will sign off at this time and remain available for questions or concerns  Subjective:   Reports to be feeling a little better with some discomfort over left hip   Objective:   BP (!) 111/55 (BP Location: Right Wrist)   Pulse 78   Temp 98 F (36.7 C) (Oral)   Resp 18   Ht 5\' 6"  (1.676 m)   Wt (!) 139.4 kg   SpO2 96%   BMI 49.60 kg/m   Intake/Output Summary (Last 24 hours) at 01/03/2021 0953 Last data filed at 01/03/2021 0600 Gross per 24 hour  Intake 2455.88 ml  Output 800 ml  Net 1655.88 ml   Weight change: 0.1 kg  Physical Exam: Gen: Comfortable resting in bed, significant ecchymosis noted around face/neck CVS: Pulse regular rhythm, normal rate, S1 and S2 with ejection systolic murmur Resp: Clear to auscultation bilaterally, no  distinct rales or rhonchi Abd: Soft, obese, nontender, bowel sounds normal Ext: Trace lower extremity edema  Imaging: No results found.  Labs: BMET Recent Labs  Lab 12/30/20 0546 12/30/20 0559 12/31/20 0824 01/01/21 0452 01/02/21 0523 01/03/21 0545  NA 135 136 132* 131* 131* 132*  K 4.7 4.6 5.1 5.0 5.7* 4.7  CL 101 101 100 100 98 100  CO2 23  --  24 21* 25 26  GLUCOSE 153* 158* 146* 120* 99 91  BUN 36* 33* 51* 51* 56* 49*  CREATININE 1.85* 1.90* 2.58* 2.17* 1.92* 1.42*  CALCIUM 9.1  --  8.1* 8.6* 8.4* 8.5*   CBC Recent Labs  Lab 01/01/21 0452 01/01/21 1724 01/02/21 0523 01/02/21 1739 01/03/21 0545  WBC 21.2* 21.2* 16.4* 13.8* 11.3*  NEUTROABS 15.9* 16.1* 12.2*  --  8.5*  HGB 7.7* 7.2* 6.0* 7.8* 7.0*  HCT 23.1* 21.7* 18.4* 23.9* 21.2*  MCV 95.1 96.4 98.9 94.1 94.6  PLT 222 237 222 218 206    Medications:     citalopram  20 mg Oral Daily   famotidine  20 mg Oral Daily   mouth rinse  15 mL Mouth Rinse BID   sodium chloride flush  3 mL Intravenous Q12H   sodium zirconium cyclosilicate  10 g Oral TID   Elmarie Shiley, MD 01/03/2021, 9:53 AM

## 2021-01-03 NOTE — Evaluation (Signed)
Physical Therapy Evaluation Patient Details Name: [name] MRN: [id number] DOB: 03-Jan-1961 Today's Date: 01/03/2021   History of Present Illness  60yo female admitted 12/30/20 with complaints of malaise and L hip pain. Of note, she received a maxillary bone graft on 12/26/20 and the graft donor site was her L iliac crest. Found to be in hypovolemic shock with multiple intra-abdominal hematomas. PMH A-flutter, A-fib, Bell's palsy, CAO, ILD, CHF, gout, HTN, HLD, lymphedema, obesity, valvular heart disease s/p MVR/AVR  Clinical Impression   Patient received in bed, very pleasant and jovial but a bit anxious about mobility. Twin sister present and very supportive, very helpful encouraging her to participate today. Overall did need as much as ModA for functional mobility today with RW, quite weak and easily fatigued. SpO2 fluctuated between 86-92% on 2LPM, but did stabilize at 90-91% at rest in recliner. Motivated to return to an independent level of function, and has a very supportive family. Left up in recliner with all needs met, chair alarm active and RN aware of pt status. May benefit from intensive therapies in CIR setting moving forward.     Follow Up Recommendations CIR;Supervision for mobility/OOB    Equipment Recommendations  Rolling walker with 5" wheels;3in1 (PT);Wheelchair (measurements PT);Wheelchair cushion (measurements PT) (bariatric)    Recommendations for Other Services       Precautions / Restrictions Precautions Precautions: Fall;Other (comment) Precaution Comments: obesity, L hip bone graft site, watch sats Restrictions Weight Bearing Restrictions: No      Mobility  Bed Mobility Overal bed mobility: Needs Assistance Bed Mobility: Supine to Sit     Supine to sit: Mod assist;HOB elevated     General bed mobility comments: able to get BLEs off EOB, then needed ModA to bring trunk up to midline sitting    Transfers Overall transfer level: Needs  assistance Equipment used: Rolling walker (2 wheeled) Transfers: Sit to/from [facility] Sit to Stand: Mod assist Stand pivot transfers: Min guard       General transfer comment: ModA to boost to standing with cues for hand placement and extra time/increased effort; once on her feet, able to pivot to chair nicely with min guard/RW and assist for line management  Ambulation/Gait             General Gait Details: deferred- fatigue  Stairs            Wheelchair Mobility    Modified Rankin (Stroke Patients Only)       Balance Overall balance assessment: Needs assistance Sitting-balance support: Bilateral upper extremity supported;Feet supported Sitting balance-Leahy Scale: Fair Sitting balance - Comments: able to maintain midline static sitting with distant S   Standing balance support: Bilateral upper extremity supported;During functional activity Standing balance-Leahy Scale: Poor Standing balance comment: reliant on external support and BUEs on RW                             Pertinent Vitals/Pain Pain Assessment: 0-10 Pain Score: 6  Pain Location: L hip at graft site Pain Descriptors / Indicators: Aching;Discomfort Pain Intervention(s): Limited activity within patient's tolerance;Monitored during session    Home Living Family/patient expects to be discharged to:: Private residence Living Arrangements: Other relatives Available Help at Discharge: Family;Available 24 hours/day Type of Home: Apartment Home Access: Level entry     Home Layout: One level Home Equipment: Walker - 2 wheels;Walker - standard Additional Comments: on chronic oxygen- "drops quickly/goes back up quickly". Has  a concentrator just in case but does not check regularly    Prior Function Level of Independence: Independent         Comments: independent with bathing/dressing; still driving; managed bills and meds before all this happened. No falls or close  calls.     Hand Dominance        Extremity/Trunk Assessment   Upper Extremity Assessment Upper Extremity Assessment: Defer to OT evaluation    Lower Extremity Assessment Lower Extremity Assessment: Generalized weakness    Cervical / Trunk Assessment Cervical / Trunk Assessment: Kyphotic  Communication   Communication: No difficulties  Cognition Arousal/Alertness: Awake/alert Behavior During Therapy: WFL for tasks assessed/performed Overall Cognitive Status: Within Functional Limits for tasks assessed                                 General Comments: jovial and pleasant, does need encouragement to participate (sister very helpful with this)      General Comments General comments (skin integrity, edema, etc.): SpO2 fluctuating between 86-92% on 2LPM; eventually stabilized at 90-91% once up in recliner    Exercises     Assessment/Plan    PT Assessment Patient needs continued PT services  PT Problem List Decreased strength;Decreased knowledge of use of DME;Obesity;Decreased activity tolerance;Decreased safety awareness;Decreased balance;Pain;Decreased mobility;Decreased coordination       PT Treatment Interventions DME instruction;Balance training;Gait training;Stair training;Functional mobility training;Patient/family education;Therapeutic activities;Wheelchair mobility training;Therapeutic exercise    PT Goals (Current goals can be found in the Care Plan section)  Acute Rehab PT Goals Patient Stated Goal: get stronger and go home, return to independence PT Goal Formulation: With patient/family Time For Goal Achievement: 01/17/21 Potential to Achieve Goals: Good    Frequency Min 4X/week   Barriers to discharge        Co-evaluation               AM-PAC PT "6 Clicks" Mobility  Outcome Measure Help needed turning from your back to your side while in a flat bed without using bedrails?: A Little Help needed moving from lying on your back to  sitting on the side of a flat bed without using bedrails?: A Lot Help needed moving to and from a bed to a chair (including a wheelchair)?: A Lot Help needed standing up from a chair using your arms (e.g., wheelchair or bedside chair)?: A Lot Help needed to walk in hospital room?: A Lot Help needed climbing 3-5 steps with a railing? : Total 6 Click Score: 12    End of Session Equipment Utilized During Treatment: Gait belt;Oxygen Activity Tolerance: Patient tolerated treatment well Patient left: in chair;with call bell/phone within reach;with chair alarm set;with family/visitor present Nurse Communication: Mobility status PT Visit Diagnosis: Unsteadiness on feet (R26.81);Muscle weakness (generalized) (M62.81);Difficulty in walking, not elsewhere classified (R26.2);Pain Pain - Right/Left: Left Pain - part of body: Hip    Time: 6294-7654 PT Time Calculation (min) (ACUTE ONLY): 48 min   Charges:   PT Evaluation $PT Eval Moderate Complexity: 1 Mod PT Treatments $Therapeutic Activity: 23-37 mins       Windell Norfolk, DPT, PN2   Supplemental Physical Therapist Ward    Pager [phone] Acute Rehab Office [phone]

## 2021-01-03 NOTE — Progress Notes (Addendum)
Progress Note  Patient Name: [name] Date of Encounter: 01/03/2021  [facility] Cardiologist: Sinclair Grooms, MD   Subjective   Feels puny, generally does not feel well, no new complaints. Abd is tender  Inpatient Medications    Scheduled Meds:  citalopram  20 mg Oral Daily   famotidine  20 mg Oral Daily   mouth rinse  15 mL Mouth Rinse BID   sodium chloride flush  3 mL Intravenous Q12H   sodium zirconium cyclosilicate  10 g Oral TID   Continuous Infusions:   PRN Meds: acetaminophen, HYDROmorphone (DILAUDID) injection, HYDROmorphone, ondansetron **OR** ondansetron (ZOFRAN) IV   Vital Signs    Vitals:   01/02/21 2320 01/03/21 0022 01/03/21 0401 01/03/21 0431  BP: (!) 108/56 140/90 (!) 146/53   Pulse: 85 85 90   Resp: 20 19 20    Temp: 98.4 F (36.9 C) 98.3 F (36.8 C) 98.3 F (36.8 C)   TempSrc: Oral Oral Oral   SpO2: 95% 93% 93%   Weight:    (!) 139.4 kg  Height:        Intake/Output Summary (Last 24 hours) at 01/03/2021 0736 Last data filed at 01/03/2021 0600 Gross per 24 hour  Intake 2455.88 ml  Output 1470 ml  Net 985.88 ml   Last 3 Weights 01/03/2021 01/02/2021 01/01/2021  Weight (lbs) 307 lb 5.1 oz 307 lb 1.6 oz 307 lb 1.6 oz  Weight (kg) 139.4 kg 139.3 kg 139.3 kg      Telemetry    SR w/ frequent PACs and PVCs - Personally Reviewed  ECG    No new tracing  Physical Exam   General: Well developed, well nourished, female in no acute distress Head: Eyes PERRLA, Head normocephalic and atraumatic, extensive ecchymosis face, jaw and neck Lungs: clear bilaterally to auscultation. Heart: HRRR S1 S2, without rub or gallop. Crisp mech valve click. No murmur. 4/4 extremity pulses are 2+ & equal. No JVD seen. Abdomen: Bowel sounds are present, abdomen firm and tender without masses or  hernias noted. Ecchymosis noted  Msk: Normal strength and tone for age. Extremities: No clubbing, cyanosis, Lymphedema R and mild edema L Skin:  No rashes or  lesions noted. Neuro: Alert and oriented X 3. Psych:  Good affect, responds appropriately  Labs    High Sensitivity Troponin:  No results for input(s): TROPONINIHS in the last 720 hours.    Chemistry Recent Labs  Lab 01/01/21 0452 01/02/21 0523 01/03/21 0545  NA 131* 131* 132*  K 5.0 5.7* 4.7  CL 100 98 100  CO2 21* 25 26  GLUCOSE 120* 99 91  BUN 51* 56* 49*  CREATININE 2.17* 1.92* 1.42*  CALCIUM 8.6* 8.4* 8.5*  PROT 5.3* 5.3* 5.7*  ALBUMIN 2.6* 2.3* 2.5*  AST 32 27 33  ALT 12 12 18   ALKPHOS 40 49 50  BILITOT 1.7* 1.8* 2.4*  GFRNONAA 25* 29* 42*  ANIONGAP 10 8 6      Hematology Recent Labs  Lab 01/02/21 0523 01/02/21 1739 01/03/21 0545  WBC 16.4* 13.8* 11.3*  RBC 1.86* 2.54* 2.24*  HGB 6.0* 7.8* 7.0*  HCT 18.4* 23.9* 21.2*  MCV 98.9 94.1 94.6  MCH 32.3 30.7 31.3  MCHC 32.6 32.6 33.0  RDW 18.0* 17.2* 17.8*  PLT 222 218 206   Lab Results  Component Value Date   TSH 3.57 09/18/2014   No results found for: HGBA1C Lab Results  Component Value Date   CHOL 200 (H) 01/16/2019  HDL 49 01/16/2019   LDLCALC 111 (H) 01/16/2019   TRIG 201 (H) 01/16/2019   CHOLHDL 4.1 01/16/2019    INR  Date Value Ref Range Status  01/03/2021 1.0 0.8 - 1.2 Final    Comment:    (NOTE) INR goal varies based on device and disease states. Performed at [facility], [place] [address] [place], [place] [address]   01/02/2021 1.2 0.8 - 1.2 Final    Comment:    (NOTE) INR goal varies based on device and disease states. Performed at [facility], [place] [address], [place], [place] [address]   01/01/2021 1.3 (H) 0.8 - 1.2 Final    Comment:    (NOTE) INR goal varies based on device and disease states. Performed at [facility], [place] [address] [place], [place] [address]   12/31/2020 1.4 (H) 0.8 - 1.2 Final    Comment:    (NOTE) INR goal varies based on device and disease states. Performed at [facility], [place] [address] [place], [place] [address]    12/30/2020 1.4 (H) 0.8 - 1.2 Final    Comment:    (NOTE) INR goal varies based on device and disease states. Performed at [facility], [place] [address], [place], [place] [address]   12/30/2020 1.4 (H) 0.8 - 1.2 Final    Comment:    (NOTE) INR goal varies based on device and disease states. Performed at [facility], [place] [address], [place], [place] [address]   12/27/2020 1.0 0.8 - 1.2 Final    Comment:    (NOTE) INR goal varies based on device and disease states. Performed at [facility], [place] [address] [place], [place] [address]      Radiology    No results found.  Cardiac Studies   N/a   Patient Profile     60 y.o. female with a hx of valvular heart disease s/p MVR/AVR w/55mm mechanical mitral valve and 19 mm mechanical aortic valve, ILD, polycythemia, CKD stage II-III, HTN, HLD, OSA and morbid obesity who is being seen 12/30/2020 for the evaluation of bleeding/need for [facility] at the request of Dr. Jimmye Norman.  Assessment & Plan    Large left wall hematomas:  - 2nd dental surgery 07/29, Maxillary alveolar ridge reconstruction with iliac crest bone graft, EXTRACTION teeth # 23,24, 25, 26, 27, removal left buccal mucosa fibroma,HARVEST ILIAC BONE GRAFT - Hgb 6.3 on admit - had lovenox > coumadin bridging but with ongoing anemia requiring repeated transfusions >> rx stopped on admission - pt given Vit K 5 mg on 08/05 - initially given 5 U PRBCs and then had another 5 units yesterday - per Dr Kennon Holter note, restart anticoag once H&H are stable - Hgb 7.8 >> 7.0 overnight - surgery has seen, rec med mgt - mgt per IM   S/p MVR/AVR:  - 2016 at [facility] - prior to surgery >> lovenox bridge - post-op, restarted coumadin on 07/30 - INR 1.0 - Surgery rec restart coumadin only w/out a load in a few days   Hypotension/Shock:  - SBP 70s >> IVF and blood products - BP meds held   CKD stage II:  - baseline 12.-1.3 but was 1.9 >> 2.58 2nd acute illness and  hypovolemia - improving - likely ATN   For questions or updates, please contact [facility] Please consult [email or web address] for contact info under        Signed, Rosaria Ferries, PA-C  01/03/2021,  7:36 AM    I have seen, examined the patient, and reviewed the above assessment and plan.  Changes to above are made where necessary.  On exam, sleeping but rouses,  extensive ecchymosis is noted.  RRR.  No new recs.  Cardiology to be available as needed over the weekend.  Co Sign: Thompson Grayer, MD 01/03/2021 9:47 AM

## 2021-01-04 ENCOUNTER — Inpatient Hospital Stay (HOSPITAL_COMMUNITY): Payer: Medicare Other

## 2021-01-04 DIAGNOSIS — S301XXA Contusion of abdominal wall, initial encounter: Secondary | ICD-10-CM | POA: Diagnosis not present

## 2021-01-04 DIAGNOSIS — I480 Paroxysmal atrial fibrillation: Secondary | ICD-10-CM | POA: Diagnosis not present

## 2021-01-04 LAB — BASIC METABOLIC PANEL
Anion gap: 7 (ref 5–15)
BUN: 42 mg/dL — ABNORMAL HIGH (ref 8–23)
CO2: 24 mmol/L (ref 22–32)
Calcium: 8.9 mg/dL (ref 8.9–10.3)
Chloride: 101 mmol/L (ref 98–111)
Creatinine, Ser: 1.23 mg/dL — ABNORMAL HIGH (ref 0.44–1.00)
GFR, Estimated: 50 mL/min — ABNORMAL LOW (ref >60)
Glucose, Bld: 91 mg/dL (ref 70–99)
Potassium: 5 mmol/L (ref 3.5–5.1)
Sodium: 132 mmol/L — ABNORMAL LOW (ref 135–145)

## 2021-01-04 LAB — CBC
HCT: 23.1 % — ABNORMAL LOW (ref 36.0–46.0)
Hemoglobin: 7.4 g/dL — ABNORMAL LOW (ref 12.0–15.0)
MCH: 31.1 pg (ref 26.0–34.0)
MCHC: 32 g/dL (ref 30.0–36.0)
MCV: 97.1 fL (ref 80.0–100.0)
Platelets: 244 10*3/uL (ref 150–400)
RBC: 2.38 MIL/uL — ABNORMAL LOW (ref 3.87–5.11)
RDW: 18.2 % — ABNORMAL HIGH (ref 11.5–15.5)
WBC: 11.6 10*3/uL — ABNORMAL HIGH (ref 4.0–10.5)
nRBC: 1 % — ABNORMAL HIGH (ref 0.0–0.2)

## 2021-01-04 LAB — PROTIME-INR
INR: 1 (ref 0.8–1.2)
Prothrombin Time: 13.1 seconds (ref 11.4–15.2)

## 2021-01-04 LAB — GLUCOSE, CAPILLARY: Glucose-Capillary: 99 mg/dL (ref 70–99)

## 2021-01-04 MED ORDER — METOPROLOL TARTRATE 25 MG PO TABS
25.0000 mg | ORAL_TABLET | Freq: Two times a day (BID) | ORAL | Status: DC
  Administered 2021-01-04 – 2021-01-09 (×11): 25 mg via ORAL
  Filled 2021-01-04 (×11): qty 1

## 2021-01-04 NOTE — Progress Notes (Signed)
Patient resting in bed alert and orin. Skin warm and dry resp. Even and unlab. . Patient in At. Fib 110's-130's . EKG done to confirm. Tim R.N. aware . See flow sheet for vital signs. Dr. Tommi Rumps page and made aware. At. 04:32 Awaiting return call. Call return at 04:36 see orders.

## 2021-01-04 NOTE — Progress Notes (Signed)
Progress Note   Subjective   Doing well today, the patient denies CP or SOB.  Back in afib.  + palpitations  Inpatient Medications    Scheduled Meds:  Chlorhexidine Gluconate Cloth  6 each Topical Daily   citalopram  20 mg Oral Daily   famotidine  20 mg Oral Daily   mouth rinse  15 mL Mouth Rinse BID   metoprolol tartrate  25 mg Oral BID   sodium chloride flush  3 mL Intravenous Q12H   Continuous Infusions:  PRN Meds: acetaminophen, HYDROmorphone (DILAUDID) injection, HYDROmorphone, ondansetron **OR** ondansetron (ZOFRAN) IV   Vital Signs    Vitals:   01/03/21 2301 01/04/21 0257 01/04/21 0320 01/04/21 0554  BP: 116/60 (!) 108/57 109/60 106/67  Pulse: 70 (!) 106 100 (!) 116  Resp: 18 20 20 20   Temp: 97.8 F (36.6 C) 98.3 F (36.8 C) 98.3 F (36.8 C) 98.4 F (36.9 C)  TempSrc: Oral Oral Oral Oral  SpO2: 91% 90% 91% 92%  Weight:   (!) 140.2 kg   Height:        Intake/Output Summary (Last 24 hours) at 01/04/2021 0835 Last data filed at 01/04/2021 0400 Gross per 24 hour  Intake 720 ml  Output 500 ml  Net 220 ml   Filed Weights   01/02/21 0338 01/03/21 0431 01/04/21 0320  Weight: (!) 139.3 kg (!) 139.4 kg (!) 140.2 kg    Telemetry    Atrial fibrillation/ atrial flutter noted,  V rates slightly elevated - Personally Reviewed  Physical Exam   GEN- The patient is ill appearing, alert and oriented x 3 today.   Head- normocephalic, atraumatic Eyes-  Sclera clear, conjunctiva pink Ears- hearing intact Oropharynx- clear Neck- supple, Lungs-  normal work of breathing Heart- irregular rate and rhythm  GI- soft  Extremities- no clubbing, cyanosis, or edema  MS- no significant deformity or atrophy Skin- diffuse ecchymosis on face/ jaw Psych- euthymic mood, full affect Neuro- strength and sensation are intact   Labs    Chemistry Recent Labs  Lab 01/01/21 0452 01/02/21 0523 01/03/21 0545 01/04/21 0043  NA 131* 131* 132* 132*  K 5.0 5.7* 4.7 5.0  CL  100 98 100 101  CO2 21* 25 26 24   GLUCOSE 120* 99 91 91  BUN 51* 56* 49* 42*  CREATININE 2.17* 1.92* 1.42* 1.23*  CALCIUM 8.6* 8.4* 8.5* 8.9  PROT 5.3* 5.3* 5.7*  --   ALBUMIN 2.6* 2.3* 2.5*  --   AST 32 27 33  --   ALT 12 12 18   --   ALKPHOS 40 49 50  --   BILITOT 1.7* 1.8* 2.4*  --   GFRNONAA 25* 29* 42* 50*  ANIONGAP 10 8 6 7      Hematology Recent Labs  Lab 01/03/21 0545 01/03/21 1146 01/04/21 0043  WBC 11.3* 11.6* 11.6*  RBC 2.24* 2.32* 2.38*  HGB 7.0* 7.3* 7.4*  HCT 21.2* 22.3* 23.1*  MCV 94.6 96.1 97.1  MCH 31.3 31.5 31.1  MCHC 33.0 32.7 32.0  RDW 17.8* 18.1* 18.2*  PLT 206 222 244     Patient ID  60 y.o. female with a hx of valvular heart disease s/p MVR/AVR w/35mm mechanical mitral valve and 19 mm mechanical aortic valve, ILD, polycythemia, CKD stage II-III, HTN, HLD, OSA and morbid obesity who is being seen 12/30/2020 for the evaluation of bleeding/need for [facility] at the request of Dr. Jimmye Norman.  Assessment & Plan    1.  Afib/ atrial flutter  Likely due to acute medical illness Unable to start anticoagulation currently Given soft BP and anemia, would not aggressively control heart rate Continue current metoprolol dosing  2. S/p MVR/AVR Hopefully we can resume anticoagulation soon Will defer to surgical team  3. Anemia Due to blood loss post op Continue to follow closely  4. Acute renal failure Nephrology is on board Continues to improve  Thompson Grayer MD, [facility] 01/04/2021 8:35 AM

## 2021-01-04 NOTE — Progress Notes (Signed)
Inpatient Rehab Admissions Coordinator Note:   Per therapy recommendations, pt was screened for CIR candidacy by Dajuan Turnley, MS CCC-SLP. At this time, Pt. Appears to have functional decline and is a good candidate for CIR. Will request order for rehab consult per protocol.  Please contact me with questions.   Leonardo Plaia, MS, CCC-SLP Rehab Admissions Coordinator  [phone] (celll) [phone] (office)  

## 2021-01-04 NOTE — Progress Notes (Signed)
Mobility Specialist: Progress Note   01/04/21 1828  Mobility  Activity Ambulated in room  Level of Assistance Standby assist, set-up cues, supervision of patient - no hands on  Assistive Device Front wheel walker  Distance Ambulated (ft) 48 ft (24'x2)  Mobility Ambulated with assistance in room  Mobility Response Tolerated well  Mobility performed by Mobility specialist  $Mobility charge 1 Mobility   Pre-Mobility: 123 HR, 122/78 BP, 96% SpO2 During Mobility: 134 HR, 96-100% SpO2 Post-Mobility: 134 HR, 128/85 BP, 95% SpO2  Pt required minA to sit EOB from supine and was standby assist to stand from EOB with bed elevated. Pt ambulated to the door and back to the bed x2 with one seated break between bouts lasting 1-2 minutes. Pt back in bed after walk with call bell and phone at her side.   [facility] Jhalen Eley Mobility Specialist Mobility Specialist Phone: [phone]

## 2021-01-04 NOTE — Progress Notes (Signed)
Subjective:  Evaluated at bedside this AM. She reports no acute changes since yesterday, denies chest pain, dyspnea, cough, leg pain. She does mention that she did take her nasal cannula off some during the night.   Objective:  Vital signs in last 24 hours: Vitals:   01/04/21 0900 01/04/21 0907 01/04/21 1100 01/04/21 1200  BP: 99/89 109/73 103/67   Pulse: (!) 114 (!) 113 (!) 118   Resp: (!) 22 19 20    Temp: 98.3 F (36.8 C) 98.3 F (36.8 C) 97.8 F (36.6 C)   TempSrc: Oral Oral Oral   SpO2: 93% 96% 96% 96%  Weight:      Height:       General: Resting comfortably in bed, no acute distress CV: Tachycardic, regular rhythm. Warm extremities. Pulm: Normal work of breathing on room air. Decreased breath sounds at bases. MSK: Right lower extremity diffusely swollen, no tenderness on palpation.  Skin: Extensive ecchymosis on left flank, appears improved from prior, and neck Neuro: Awake, alert, conversing appropriately Psych: Normal mood, affect, speech  Assessment/Plan:  Darnise Montag is 60yo person with valvular heart disease s/p MVR/AVR on Coumadin, atrial fibrillation, HFpEF, idiopathic pulmonary hemosiderosis, chronic respiratory failure on home oxygen as needed, CKD III admitted 8/2 with hypovolemic shock due to blood loss in intra-abdominal wall hematomas, now with stable blood counts but acutely became tachycardic with increased oxygen requirement overnight.  Principal Problem:   Hematoma of abdominal wall Active Problems:   History of mitral valve replacement with mechanical valve   Chronic diastolic heart failure (HCC)   Acute kidney injury superimposed on CKD (HCC)   Hypovolemic shock (HCC)  #Large abdominal hematomas Coumadin has been on hold since admission due to continued bleeding into hematomas. H/H has held steady since patient received IV fluids, vitamin K, and FFP two days ago. This morning, hematoma looks slightly improved on exam. Hopeful that bleeding has  ceased and we can initiate patient's home Coumadin tomorrow morning.  - Daily CBC, INR - Consider re-starting Coumadin in AM - Dilaudid 1mg  q4h PRN for pain  #Acute on chronic respiratory failure #Sinus tachycardia with intermittent atrial fibrillation/flutter Overnight, patient developed tachycardia to 110's with increased oxygen requirement of 4L (from 2L). Early this morning, metoprolol was re-started per cardiology. Patient denies any symptoms during this time. Upon review of telemetry, appears intermittent atrial fibrillation/flutter. EKG this AM revealed sinus tachycardia without signs of RV strain. Chest x-ray obtained this AM with left lower opacity, atelectasis vs PNA. She currently is not showing other signs of infection. Does not appear hypervolemic (hx HFpEF). Consider PE given acute onset, although treatment would not change much at this time since [facility] is being held in setting of hematomas. Blood pressures have remained appropriate, which is reassuring. Will continue to monitor throughout today, watch for signs of infection, worsening hypoxia. - Continue telemetry - Continue home metoprolol 25mg  twice daily - Hold Coumadin  #Non-oliguric acute kidney injury on CKD #Urinary retention Creatinine 1.23 this AM, improving from 1.4 yesterday. Patient had 500cc urine output recorded yesterday. Will continue with Foley for bladder decompression, can consider voiding trial in the next day or so.  - Continue Foley - Hold nephrotoxins - Daily BMP  #Mechanical AVR,MVR Continuing to hold Coumadin, hopeful to be able to re-start tomorrow. - Hold Coumadin  Prior to Admission Living Arrangement: Home Anticipated Discharge Location: CIR Barriers to Discharge: medical management Dispo: Anticipated discharge in approximately >2 day(s).   Sanjuan Dame, MD 01/04/2021, 1:25 PM Pager:  [phone] After 5pm on weekdays and 1pm on weekends: On Call pager [phone]

## 2021-01-04 NOTE — Evaluation (Signed)
Occupational Therapy Evaluation Patient Details Name: [name] MRN: [id number] DOB: 08-24-1960 Today's Date: 01/04/2021    History of Present Illness 60yo female admitted 12/30/20 with complaints of malaise and L hip pain. Of note, she received a maxillary bone graft on 12/26/20 and the graft donor site was her L iliac crest. Found to be in hypovolemic shock with multiple intra-abdominal hematomas. PMH A-flutter, A-fib, Bell's palsy, CAO, ILD, CHF, gout, HTN, HLD, lymphedema, obesity, valvular heart disease s/p MVR/AVR   Clinical Impression   Pt admitted with above. She demonstrates the below listed deficits and will benefit from continued OT to maximize safety and independence with BADLs.  Pt demonstrates generalized weakness, decreased activity tolerance, pain, and impaired balance.  She currently requires min A - mod A for UB ADLs and max - total A for LB ADLs.  She reports she lives with her sister PTA, and was fully independent.  Recommend CIR level rehab.      Follow Up Recommendations  CIR    Equipment Recommendations  None recommended by OT    Recommendations for Other Services       Precautions / Restrictions Precautions Precautions: Fall;Other (comment) Precaution Comments: obesity, L hip bone graft site, watch sats; Rt LE lymphedema      Mobility Bed Mobility Overal bed mobility: Needs Assistance Bed Mobility: Rolling Rolling: Mod assist              Transfers                 General transfer comment: pt deferred    Balance                                           ADL either performed or assessed with clinical judgement   ADL Overall ADL's : Needs assistance/impaired Eating/Feeding: Modified independent;Bed level   Grooming: Wash/dry hands;Wash/dry face;Oral care;Brushing hair;Set up;Bed level   Upper Body Bathing: Minimal assistance;Bed level   Lower Body Bathing: Maximal assistance;Bed level   Upper Body Dressing :  Moderate assistance;Bed level   Lower Body Dressing: Total assistance;Bed level   Toilet Transfer: Total assistance Toilet Transfer Details (indicate cue type and reason): Pt deferred this date due to fatigue Toileting- Clothing Manipulation and Hygiene: Total assistance;Bed level         General ADL Comments: Pt very limited this dated due to fatigue - she had just returned from x-ray and reports poor sleep last pm.  HR sustained 115     Vision Patient Visual Report: No change from baseline       Perception     Praxis      Pertinent Vitals/Pain Pain Assessment: Faces Faces Pain Scale: Hurts little more Pain Location: L hip at graft site and jaws Pain Descriptors / Indicators: Aching;Discomfort Pain Intervention(s): Monitored during session;Ice applied     Hand Dominance Right   Extremity/Trunk Assessment Upper Extremity Assessment Upper Extremity Assessment: Generalized weakness   Lower Extremity Assessment Lower Extremity Assessment: Defer to PT evaluation   Cervical / Trunk Assessment Cervical / Trunk Assessment: Kyphotic   Communication Communication Communication: No difficulties   Cognition Arousal/Alertness: Awake/alert Behavior During Therapy: WFL for tasks assessed/performed Overall Cognitive Status: Within Functional Limits for tasks assessed  General Comments  sp02 96% on 4L supplemental 02; HR 115    Exercises Exercises: Other exercises Other Exercises Other Exercises: pt performed 10 reps shoulder flexion bil. actively   Shoulder Instructions      Home Living Family/patient expects to be discharged to:: Private residence Living Arrangements: Other relatives (sister) Available Help at Discharge: Family;Available 24 hours/day Type of Home: Apartment Home Access: Level entry     Home Layout: One level     Bathroom Shower/Tub: Teacher, early years/pre: Standard Bathroom  Accessibility: Yes   Home Equipment: Walker - 2 wheels;Walker - standard   Additional Comments: on chronic oxygen- "drops quickly/goes back up quickly". Has a concentrator just in case but does not check regularly      Prior Functioning/Environment Level of Independence: Independent        Comments: Pt reports she was independent with ADLs, and IADLs.  Drives, grocery shops.  She is not employed due to disability due to Rt LE lymphedema        OT Problem List: Decreased strength;Decreased activity tolerance;Impaired balance (sitting and/or standing);Decreased knowledge of use of DME or AE;Cardiopulmonary status limiting activity;Obesity;Pain      OT Treatment/Interventions: Self-care/ADL training;Therapeutic exercise;DME and/or AE instruction;Energy conservation;Therapeutic activities;Patient/family education;Balance training    OT Goals(Current goals can be found in the care plan section) Acute Rehab OT Goals Patient Stated Goal: to get back to normal OT Goal Formulation: With patient Time For Goal Achievement: 01/18/21 Potential to Achieve Goals: Good ADL Goals Pt Will Perform Grooming: with set-up;sitting Pt Will Perform Upper Body Bathing: with set-up;sitting Pt Will Perform Lower Body Bathing: with min assist;sit to/from stand;with adaptive equipment Pt Will Perform Upper Body Dressing: with set-up;with supervision;sitting Pt Will Perform Lower Body Dressing: with min assist;with adaptive equipment;sit to/from stand Pt Will Transfer to Toilet: with min assist;squat pivot transfer;bedside commode Pt Will Perform Toileting - Clothing Manipulation and hygiene: with min assist;sit to/from stand Pt/caregiver will Perform Home Exercise Program: Increased strength;Right Upper extremity;Left upper extremity;With theraband;With written HEP provided  OT Frequency: Min 2X/week   Barriers to D/C:            Co-evaluation              AM-PAC OT "6 Clicks" Daily Activity      Outcome Measure Help from another person eating meals?: None Help from another person taking care of personal grooming?: A Little Help from another person toileting, which includes using toliet, bedpan, or urinal?: A Lot Help from another person bathing (including washing, rinsing, drying)?: A Lot Help from another person to put on and taking off regular upper body clothing?: A Lot Help from another person to put on and taking off regular lower body clothing?: Total 6 Click Score: 14   End of Session Equipment Utilized During Treatment: Oxygen Nurse Communication: Mobility status  Activity Tolerance: Patient limited by fatigue Patient left: in bed;with call bell/phone within reach  OT Visit Diagnosis: Unsteadiness on feet (R26.81);Pain;Muscle weakness (generalized) (M62.81) Pain - Right/Left: Left Pain - part of body: Hip                Time: 1037-1050 OT Time Calculation (min): 13 min Charges:  OT General Charges $OT Visit: 1 Visit OT Evaluation $OT Eval Moderate Complexity: 1 Mod  Nilsa Nutting., OTR/L Acute Rehabilitation Services Pager [phone] Office [phone]   Lucille Passy M 01/04/2021, 2:21 PM

## 2021-01-04 NOTE — Progress Notes (Signed)
Notice o2 sats drop low to med 80's with messy waved form. Went to check on patient and she had taken o2 off for a break. "I got tired of it around my neck. ." Patient stated. [employer] 108/57 MAP 74 resp 20 .Increase o2 to 3 liters sats grad. Came up to 90% Increase 02 to 4 liters. 92-93%. Tim R.N. aware,cont. To monitor patient and rhythm.

## 2021-01-05 DIAGNOSIS — Z952 Presence of prosthetic heart valve: Secondary | ICD-10-CM

## 2021-01-05 DIAGNOSIS — I480 Paroxysmal atrial fibrillation: Secondary | ICD-10-CM | POA: Diagnosis not present

## 2021-01-05 DIAGNOSIS — S301XXA Contusion of abdominal wall, initial encounter: Secondary | ICD-10-CM | POA: Diagnosis not present

## 2021-01-05 DIAGNOSIS — N179 Acute kidney failure, unspecified: Secondary | ICD-10-CM | POA: Diagnosis not present

## 2021-01-05 LAB — BASIC METABOLIC PANEL
Anion gap: 8 (ref 5–15)
BUN: 39 mg/dL — ABNORMAL HIGH (ref 8–23)
CO2: 24 mmol/L (ref 22–32)
Calcium: 9 mg/dL (ref 8.9–10.3)
Chloride: 102 mmol/L (ref 98–111)
Creatinine, Ser: 1.18 mg/dL — ABNORMAL HIGH (ref 0.44–1.00)
GFR, Estimated: 53 mL/min — ABNORMAL LOW (ref >60)
Glucose, Bld: 89 mg/dL (ref 70–99)
Potassium: 5 mmol/L (ref 3.5–5.1)
Sodium: 134 mmol/L — ABNORMAL LOW (ref 135–145)

## 2021-01-05 LAB — CBC
HCT: 22.6 % — ABNORMAL LOW (ref 36.0–46.0)
Hemoglobin: 7.4 g/dL — ABNORMAL LOW (ref 12.0–15.0)
MCH: 30.3 pg (ref 26.0–34.0)
MCHC: 32.7 g/dL (ref 30.0–36.0)
MCV: 92.6 fL (ref 80.0–100.0)
Platelets: 49 10*3/uL — ABNORMAL LOW (ref 150–400)
RBC: 2.44 MIL/uL — ABNORMAL LOW (ref 3.87–5.11)
RDW: 17.6 % — ABNORMAL HIGH (ref 11.5–15.5)
WBC: 9.5 10*3/uL (ref 4.0–10.5)
nRBC: 0.8 % — ABNORMAL HIGH (ref 0.0–0.2)

## 2021-01-05 LAB — HEPATIC FUNCTION PANEL
ALT: 34 U/L (ref 0–44)
AST: 59 U/L — ABNORMAL HIGH (ref 15–41)
Albumin: 2.4 g/dL — ABNORMAL LOW (ref 3.5–5.0)
Alkaline Phosphatase: 102 U/L (ref 38–126)
Bilirubin, Direct: 0.9 mg/dL — ABNORMAL HIGH (ref 0.0–0.2)
Indirect Bilirubin: 1.6 mg/dL — ABNORMAL HIGH (ref 0.3–0.9)
Total Bilirubin: 2.5 mg/dL — ABNORMAL HIGH (ref 0.3–1.2)
Total Protein: 6.3 g/dL — ABNORMAL LOW (ref 6.5–8.1)

## 2021-01-05 LAB — PROTIME-INR
INR: 1.4 — ABNORMAL HIGH (ref 0.8–1.2)
Prothrombin Time: 17.4 seconds — ABNORMAL HIGH (ref 11.4–15.2)

## 2021-01-05 LAB — DIC (DISSEMINATED INTRAVASCULAR COAGULATION)PANEL
D-Dimer, Quant: 2.97 ug/mL-FEU — ABNORMAL HIGH (ref 0.00–0.50)
Fibrinogen: >800 mg/dL — ABNORMAL HIGH (ref 210–475)
INR: 1 (ref 0.8–1.2)
Platelets: 313 10*3/uL (ref 150–400)
Prothrombin Time: 12.9 seconds (ref 11.4–15.2)
Smear Review: NONE SEEN
aPTT: 35 seconds (ref 24–36)

## 2021-01-05 LAB — TYPE AND SCREEN
ABO/RH(D): O POS
Antibody Screen: NEGATIVE

## 2021-01-05 MED ORDER — MELATONIN 3 MG PO TABS
3.0000 mg | ORAL_TABLET | Freq: Every day | ORAL | Status: DC
  Administered 2021-01-05 – 2021-01-08 (×4): 3 mg via ORAL
  Filled 2021-01-05 (×4): qty 1

## 2021-01-05 NOTE — Progress Notes (Addendum)
Progress Note   Subjective   No chest pain or dyspnea.   Inpatient Medications    Scheduled Meds:  Chlorhexidine Gluconate Cloth  6 each Topical Daily   citalopram  20 mg Oral Daily   famotidine  20 mg Oral Daily   mouth rinse  15 mL Mouth Rinse BID   metoprolol tartrate  25 mg Oral BID   sodium chloride flush  3 mL Intravenous Q12H   Continuous Infusions:  PRN Meds: acetaminophen, HYDROmorphone, ondansetron **OR** ondansetron (ZOFRAN) IV   Vital Signs    Vitals:   01/05/21 0300 01/05/21 0305 01/05/21 0400 01/05/21 0500  BP: 105/80 105/80 111/69 103/83  Pulse: (!) 112 (!) 107 (!) 127 (!) 102  Resp: 18 20 17 19   Temp:  97.8 F (36.6 C)    TempSrc:  Oral    SpO2: 94% 93% 95% 95%  Weight:  (!) 141.9 kg    Height:        Intake/Output Summary (Last 24 hours) at 01/05/2021 0801 Last data filed at 01/05/2021 9798 Gross per 24 hour  Intake 720 ml  Output 2450 ml  Net -1730 ml   Filed Weights   01/03/21 0431 01/04/21 0320 01/05/21 0305  Weight: (!) 139.4 kg (!) 140.2 kg (!) 141.9 kg    Telemetry    Atrial fib, rates 80-120 bpm - Personally Reviewed  Physical Exam   General: Obese female in NAD. HEENT: OP clear, mucus membranes moist  SKIN: Facial bruising noted Neuro: No focal deficits  Musculoskeletal: Muscle strength 5/5 all ext  Psychiatric: Mood and affect normal  Lungs:Clear bilaterally, no wheezes, rhonci, crackles Cardiovascular: Irregular irregular. Valve click noted. No murmurs, gallops or rubs. Abdomen:Soft. Bowel sounds present. Non-tender.  Extremities: No lower extremity edema.   Labs    Chemistry Recent Labs  Lab 01/01/21 0452 01/02/21 0523 01/03/21 0545 01/04/21 0043 01/05/21 0127  NA 131* 131* 132* 132* 134*  K 5.0 5.7* 4.7 5.0 5.0  CL 100 98 100 101 102  CO2 21* 25 26 24 24   GLUCOSE 120* 99 91 91 89  BUN 51* 56* 49* 42* 39*  CREATININE 2.17* 1.92* 1.42* 1.23* 1.18*  CALCIUM 8.6* 8.4* 8.5* 8.9 9.0  PROT 5.3* 5.3* 5.7*  --    --   ALBUMIN 2.6* 2.3* 2.5*  --   --   AST 32 27 33  --   --   ALT 12 12 18   --   --   ALKPHOS 40 49 50  --   --   BILITOT 1.7* 1.8* 2.4*  --   --   GFRNONAA 25* 29* 42* 50* 53*  ANIONGAP 10 8 6 7 8      Hematology Recent Labs  Lab 01/03/21 1146 01/04/21 0043 01/05/21 0127  WBC 11.6* 11.6* 9.5  RBC 2.32* 2.38* 2.44*  HGB 7.3* 7.4* 7.4*  HCT 22.3* 23.1* 22.6*  MCV 96.1 97.1 92.6  MCH 31.5 31.1 30.3  MCHC 32.7 32.0 32.7  RDW 18.1* 18.2* 17.6*  PLT 222 244 49*     Patient ID  60 y.o. female with a hx of valvular heart disease s/p MVR/AVR w/24mm mechanical mitral valve and 19 mm mechanical aortic valve, ILD, polycythemia, CKD stage II-III, HTN, HLD, OSA and morbid obesity who is being seen 12/30/2020 for the evaluation of bleeding/need for [facility] at the request of Dr. Jimmye Norman. Blood loss anemia due to abdominal wall hematoma. Coumadin has been held. New onset atrial fib 01/04/21.  Assessment & Plan  1.  Afib/ atrial flutter: She has been seen by our EP team, Dr. Rayann Heman. Her atrial fib is felt to be due to her medical illness. Unable to start anti-coagulation due to anemia. Heart rates 80-120 bpm. Given her relatively soft BP and anemia, not planning to aggressively rate control. Continue current dose of metoprolol but can consider increasing Metoprolol for better rate control as her BP seems to be better today.   2. S/p MVR/AVR: Coumadin is on hold. Resume when safe from a surgical perspective. Will defer to surgical and primary team  3. Anemia: This is felt to be due to blood loss post op. Stable.   4. Acute renal failure: Resolving. Nephrology following.   5. Thrombocytopenia: New drop in platelets. Would repeat now.   [name] 01/05/2021 8:04 AM

## 2021-01-05 NOTE — Progress Notes (Signed)
Physical Therapy Treatment Patient Details Name: [name] MRN: [id number] DOB: April 10, 1961 Today's Date: 01/05/2021    History of Present Illness 60yo female admitted 12/30/20 with complaints of malaise and L hip pain. Of note, she received a maxillary bone graft on 12/26/20 and the graft donor site was her L iliac crest. Found to be in hypovolemic shock with multiple intra-abdominal hematomas. PMH A-flutter, A-fib, Bell's palsy, CAO, ILD, CHF, gout, HTN, HLD, lymphedema, obesity, valvular heart disease s/p MVR/AVR    PT Comments    Pt making good progress today.  Had planned on attempting hallway ambulation with rollator for rest breaks , but after pt used BSC for BM she was fatigued and only ambulated in room.  Requiring less assistance than eval and with good motivation.  Pt expressing interest in possibly being able to return home instead of CIR - discussed would leave current recommendation at CIR but see if we can progress pending how long she is admitted.     Follow Up Recommendations  CIR;Supervision for mobility/OOB     Equipment Recommendations  Other (comment) (bariatric rollator)    Recommendations for Other Services       Precautions / Restrictions Precautions Precautions: Fall;Other (comment) Precaution Comments: obesity, L hip bone graft site, watch sats; Rt LE lymphedema    Mobility  Bed Mobility Overal bed mobility: Needs Assistance Bed Mobility: Supine to Sit;Sit to Supine     Supine to sit: Min guard Sit to supine: Min guard   General bed mobility comments: min guard with increased time and use of rails    Transfers Overall transfer level: Needs assistance Equipment used: 4-wheeled walker Transfers: Sit to/from [facility] Sit to Stand: Min guard Stand pivot transfers: Min guard       General transfer comment: Sit to stand from bed x 2 and from bsc x 2 - cues for rollator use and safe hand placement.  Stand pivot to bsc and back  to bed with min guard  Ambulation/Gait Ambulation/Gait assistance: Min guard Gait Distance (Feet): 25 Feet Assistive device: 4-wheeled walker Gait Pattern/deviations: Step-through pattern;Decreased stride length Gait velocity: decreased   General Gait Details: Pt fatigued after having BM and only able to ambulate to door and back.  Educated on rollator use and how to set brakes in order to take a rest break.   Stairs             Wheelchair Mobility    Modified Rankin (Stroke Patients Only)       Balance Overall balance assessment: Needs assistance Sitting-balance support: Feet supported;No upper extremity supported Sitting balance-Leahy Scale: Good     Standing balance support: Single extremity supported;Bilateral upper extremity supported Standing balance-Leahy Scale: Poor Standing balance comment: Requiring at least single UE support                            Cognition Arousal/Alertness: Awake/alert Behavior During Therapy: WFL for tasks assessed/performed Overall Cognitive Status: Within Functional Limits for tasks assessed                                        Exercises      General Comments General comments (skin integrity, edema, etc.): Pt on 3 L O2 with sats typically >92%, did drop to upper 80's when pt holding breath during BM - cued for breathing.  HR 96-133 bpm during session.  Pt requiring assist for toileting ADLs      Pertinent Vitals/Pain Pain Assessment: No/denies pain    Home Living                      Prior Function            PT Goals (current goals can now be found in the care plan section) Acute Rehab PT Goals Patient Stated Goal: to get back to normal PT Goal Formulation: With patient Time For Goal Achievement: 01/17/21 Potential to Achieve Goals: Good Progress towards PT goals: Progressing toward goals    Frequency    Min 3X/week      PT Plan Frequency needs to be updated     Co-evaluation              AM-PAC PT "6 Clicks" Mobility   Outcome Measure  Help needed turning from your back to your side while in a flat bed without using bedrails?: None Help needed moving from lying on your back to sitting on the side of a flat bed without using bedrails?: A Little Help needed moving to and from a bed to a chair (including a wheelchair)?: A Little Help needed standing up from a chair using your arms (e.g., wheelchair or bedside chair)?: A Little Help needed to walk in hospital room?: A Little Help needed climbing 3-5 steps with a railing? : A Lot 6 Click Score: 18    End of Session Equipment Utilized During Treatment: Gait belt;Oxygen Activity Tolerance: Patient tolerated treatment well Patient left: in bed;with call bell/phone within reach Nurse Communication: Mobility status PT Visit Diagnosis: Unsteadiness on feet (R26.81);Muscle weakness (generalized) (M62.81);Difficulty in walking, not elsewhere classified (R26.2);Pain Pain - Right/Left: Left Pain - part of body: Hip     Time: 4888-9169 PT Time Calculation (min) (ACUTE ONLY): 33 min  Charges:  $Gait Training: 8-22 mins $Therapeutic Activity: 8-22 mins                     Abran Richard, PT Acute Rehab Services Pager [phone] [facility] [phone]    Karlton Lemon 01/05/2021, 6:19 PM

## 2021-01-05 NOTE — Progress Notes (Signed)
Subjective: I seen and evaluated Ms. Lorel Monaco at bedside.  She states that she is doing okay.  She reports some pain in her abdomen and back of neck.  She believes the pain is due to the swelling pulling to the posterior of her body.  Yesterday she worked with PT and movement around the room caused her to became short of breath and required increased oxygen from 2 L to 4 L. Currently she states that she has no difficulty breathing and is comfortable.   Objective:  Vital signs in last 24 hours: Vitals:   01/05/21 0400 01/05/21 0500 01/05/21 0800 01/05/21 1005  BP: 111/69 103/83 121/82   Pulse: (!) 127 (!) 102 91   Resp: 17 19 18    Temp:   98.2 F (36.8 C)   TempSrc:   Oral   SpO2: 95% 95% 94% 92%  Weight:      Height:       Physical Exam HENT:     Head: Normocephalic and atraumatic.  Eyes:     Comments: Periorbital ecchymosis   Neck:     Comments: Bruising noted on the anterior surface of the jaw and neck.  Minimal swelling noted on the lateral sides of the neck.  Cardiovascular:     Rate and Rhythm: Rhythm irregular.     Comments: Audible valve click Pulmonary:     Comments: On nasal cannula Abdominal:     Tenderness: There is abdominal tenderness (with palpation) in the left lower quadrant.     Comments: Bruising noted on the left lower quadrant of the abdomen.   Musculoskeletal:     Cervical back: Edema present.     Comments: The bruising extends up the left axilla and left scapula. Originating from LLQ of abdomin  Neurological:     Mental Status: She is alert.  Psychiatric:        Behavior: Behavior is cooperative.     Assessment/Plan:  Principal Problem:   Hematoma of abdominal wall Active Problems:   History of mitral valve replacement with mechanical valve   Chronic diastolic heart failure (HCC)   Acute kidney injury superimposed on CKD (HCC)   Hypovolemic shock (HCC) Coumadin has been on hold since admission due to continued bleeding into hematomas.  H/H has held steady since patient received IV fluids, vitamin K, and FFP two days ago. This morning, hematoma looks slightly improved on exam. Increased bruising extending from LLQ to left axilla and left scapula. Less rigid on abdominal palpation. Overnight platelets dropped to 46 from 244. Stat DIC workup revealed PT 17.4; INR 1.4; fibrinogen greater than 800; D-dimer 2.97; platelet count 313.  DIC ruled out.  Hemoglobin is currently stable at 7.4.  - Daily CBC, INR; continue to monitor Hgb. If stable by tomorrow; will transfuse blood products to increase Hgb hopefully to 9 in order to restart her coumadin.  - Consider re-starting Coumadin in AM dependant on hgb level - Dilaudid oral 1mg  q4h PRN for pain  Acute on chronic respiratory failure Sinus tachycardia with intermittent atrial fibrillation/flutter Overnight, patient developed tachycardia to 110's with increased oxygen requirement of 4L (from 2L). This occurred when patient worked with PT yesterday. Yesterday cardiology restarted metoprolol due to patient developing tachycardia with increased oxygen demand (4L from 2L). Upon review of telemetry, appears intermittent atrial fibrillation/flutter. EKG revealed sinus tachycardia without signs of RV strain. Chest x-ray obtained revealed left lower opacity, atelectasis vs PNA. She currently is not showing other signs of infection. Blood pressures  have remained appropriate, which is reassuring. Will continue to monitor throughout today, watch for signs of infection, worsening hypoxia. - Continue telemetry - Continue home metoprolol 25mg  twice daily -- Monitor Oxygen saturation >92% - Hold Coumadin  Non-oliguric acute kidney injury on CKD Urinary retention Will continue with Foley for bladder decompression; consider foley trial in the near future - Continue Foley - Hold nephrotoxins - Daily BMP  Mechanical AVR,MVR Continuing to hold Coumadin, may be able to re-start pending hgb improvement -  Hold Coumadin    Prior to Admission Living Arrangement: Anticipated Discharge Location: Barriers to Discharge: Dispo: Anticipated discharge in approximately 3-5 day(s).   Timothy Lasso, MD 01/05/2021, 12:11 PM Pager: [phone] After 5pm on weekdays and 1pm on weekends: On Call pager [phone]

## 2021-01-05 NOTE — Progress Notes (Signed)
Inpatient Rehab Admissions Coordinator:   I met with patient and her daughter at the bedside to discuss CIR recommendations.  I reviewed 3 hr/day and average length of stay up to 2 weeks (dependent upon progress).  Both note that pt is mobilizing fairly well, and right now main concerns are medical, which I agree with.  I let them know that Medicare does not require prior authorization, so we do have the flexibility to take it day by day.  I will continue to follow for progress with medical stability and functional mobility.    Shann Medal, PT, DPT Admissions Coordinator [phone] 01/05/21  1:29 PM

## 2021-01-06 DIAGNOSIS — N189 Chronic kidney disease, unspecified: Secondary | ICD-10-CM

## 2021-01-06 DIAGNOSIS — I480 Paroxysmal atrial fibrillation: Secondary | ICD-10-CM | POA: Diagnosis not present

## 2021-01-06 LAB — CBC
HCT: 23.6 % — ABNORMAL LOW (ref 36.0–46.0)
Hemoglobin: 7.2 g/dL — ABNORMAL LOW (ref 12.0–15.0)
MCH: 30.1 pg (ref 26.0–34.0)
MCHC: 30.5 g/dL (ref 30.0–36.0)
MCV: 98.7 fL (ref 80.0–100.0)
Platelets: 324 10*3/uL (ref 150–400)
RBC: 2.39 MIL/uL — ABNORMAL LOW (ref 3.87–5.11)
RDW: 18 % — ABNORMAL HIGH (ref 11.5–15.5)
WBC: 9.9 10*3/uL (ref 4.0–10.5)
nRBC: 0.5 % — ABNORMAL HIGH (ref 0.0–0.2)

## 2021-01-06 LAB — BASIC METABOLIC PANEL
Anion gap: 7 (ref 5–15)
BUN: 37 mg/dL — ABNORMAL HIGH (ref 8–23)
CO2: 24 mmol/L (ref 22–32)
Calcium: 8.9 mg/dL (ref 8.9–10.3)
Chloride: 101 mmol/L (ref 98–111)
Creatinine, Ser: 1.05 mg/dL — ABNORMAL HIGH (ref 0.44–1.00)
GFR, Estimated: >60 mL/min (ref >60)
Glucose, Bld: 100 mg/dL — ABNORMAL HIGH (ref 70–99)
Potassium: 4.6 mmol/L (ref 3.5–5.1)
Sodium: 132 mmol/L — ABNORMAL LOW (ref 135–145)

## 2021-01-06 LAB — PREPARE RBC (CROSSMATCH)

## 2021-01-06 LAB — PROTIME-INR
INR: 1.1 (ref 0.8–1.2)
Prothrombin Time: 13.7 seconds (ref 11.4–15.2)

## 2021-01-06 LAB — HEMOGLOBIN AND HEMATOCRIT, BLOOD
HCT: 30.8 % — ABNORMAL LOW (ref 36.0–46.0)
Hemoglobin: 9.6 g/dL — ABNORMAL LOW (ref 12.0–15.0)

## 2021-01-06 MED ORDER — SODIUM CHLORIDE 0.9% IV SOLUTION: Freq: Once | INTRAVENOUS | Status: AC

## 2021-01-06 NOTE — Care Management Important Message (Signed)
Important Message  Patient Details  Name: [name] MRN: [id number] Date of Birth: 10/21/1960   Medicare Important Message Given:  Yes     Shelda Altes 01/06/2021, 9:41 AM

## 2021-01-06 NOTE — Progress Notes (Signed)
Pt stated she doesn't wear CPAP at home. Pt doesn't want to wear CPAP.  

## 2021-01-06 NOTE — Progress Notes (Addendum)
Subjective: I seen and evaluated [name] at bedside.  She states that she is feeling fine.  She denies shortness of breath.  She reports some abdominal discomfort, but when she lays on her right side it relieves the pain temporarily.  She also reports the current pain medications do relieve the pain and does not request any changes. She admits that she has been working with PT/OT and has been moving around her room and plans to attempt to walk down the hallway for physical deconditioning.  She denies headache, chest pain, SOB, or palpitations.  Objective:  Vital signs in last 24 hours: Vitals:   01/05/21 1257 01/05/21 1937 01/05/21 2242 01/06/21 0343  BP: 107/76 101/86 117/85 109/69  Pulse: 74 (!) 106 (!) 111 65  Resp: 20 20 20 16   Temp: 98.3 F (36.8 C) 98.3 F (36.8 C) 98.3 F (36.8 C) 98.3 F (36.8 C)  TempSrc: Oral Oral Oral Oral  SpO2: 91% 93% 92% 99%  Weight:    (!) 142.5 kg  Height:       Physical Exam Constitutional:      General: She is awake.  HENT:     Head: Normocephalic and atraumatic.  Eyes:     General: Lids are normal.     Comments: Periorbital ecchymosis   Neck:     Comments: Minimal swelling present along lateral sides of neck. Cardiovascular:     Rate and Rhythm: Normal rate and regular rhythm.     Comments: Audible valve clicks Pulmonary:     Effort: No respiratory distress.     Breath sounds: Normal breath sounds.     Comments: Nasal cannula present Abdominal:     Palpations: Abdomen is soft.     Comments: Bruising localized to the LLQ, decreased rigidity upon palpation. Bruising extends to left scapula.  Musculoskeletal:     Right lower leg: 3+ Edema (lymphedema) present.     Left lower leg: Normal.  Skin:    General: Skin is warm and dry.  Neurological:     General: No focal deficit present.     Mental Status: She is alert.  Psychiatric:        Attention and Perception: Attention normal.        Mood and Affect: Mood normal.         Behavior: Behavior normal. Behavior is cooperative.     Assessment/Plan:  Principal Problem:   Hematoma of abdominal wall Active Problems:   History of mitral valve replacement with mechanical valve   Chronic diastolic heart failure (HCC)   Acute kidney injury superimposed on CKD (HCC)   Hypovolemic shock (HCC)  Hematoma of Abdominal Wall Coumadin has been on hold since admission due to continued bleeding into hematomas. H/H has held steady since patient received IV fluids, vitamin K, and FFP three days ago. Patient has not required any transfusions since 01/03/21. Hemoglobin has been steady range 7.4-7.2.  --transfusing 1 unit today to increase hgb to 9  in order to restart coumadin. Per cardiology recommendation, acceptable hgb of 9 in order to restart coumadin --- Dilaudid oral 1mg  q4h PRN for pain  Acute on chronic respiratory failure Sinus tachycardia with intermittent atrial fibrillation/flutter Repeat EKG revealed a. Flutter with variable AV block. Chest x-ray obtained revealed left lower opacity, atelectasis vs PNA. She currently is not showing other signs of infection, afebrile and WBC WNL. Blood pressures have remained appropriate, which is reassuring. Will continue to monitor throughout today, watch for signs of infection,  worsening hypoxia. - Continue telemetry - Continue home metoprolol 25mg  twice daily -- Monitor Oxygen saturation >92% - Hold Coumadin -- CPAP ordered   Non-oliguric acute kidney injury on CKD Urinary retention Will continue with Foley for bladder decompression; Creatinine continues to improve, currently 1.05 down from 1.58. Consider foley trial when kidney function is WNL. 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 Recheck showed normal platelets, however, her fibrinogen and D dimer were elevated.  These can be explained by FFP infusion which she received previously.  Will monitor closely for any further drop in platelets.    --Repeat CBC in the AM     Prior to Admission Living Arrangement: Anticipated Discharge Location: Barriers to Discharge: Dispo: Anticipated discharge in approximately 2-3 day(s).   Timothy Lasso, MD 01/06/2021, 6:38 AM Pager: [phone] After 5pm on weekdays and 1pm on weekends: On Call pager [phone]

## 2021-01-06 NOTE — Progress Notes (Signed)
Mobility Specialist: Progress Note   01/06/21 1302  Mobility  Activity Ambulated in room  Level of Assistance Standby assist, set-up cues, supervision of patient - no hands on  Assistive Device Front wheel walker  Distance Ambulated (ft) 84 ft (28'+56')  Mobility Ambulated with assistance in room  Mobility Response Tolerated well  Mobility performed by Mobility specialist  $Mobility charge 1 Mobility   Pre-Mobility 1 L/min [place]: 98 HR During Mobility 3 L/min [place]: 103 HR, 115/64 BP, 94% SpO2 Post-Mobility on 2 L.min [place]: 128 HR, 98/68 BP, 92% SpO2  Pt ambulated to the door and back x3 with one seated break after 18ft d/t SOB and fatigue. Pt ambulated on 3 L/min [place] but was able to wean back down to 2 L/min after recovery. Pt back in bed with call bell and phone at her side.   [facility] Carlyn Lemke Mobility Specialist Mobility Specialist Phone: [phone]

## 2021-01-06 NOTE — Progress Notes (Addendum)
Progress Note   Subjective   No chest pain or dyspnea No events  Inpatient Medications    Scheduled Meds:  Chlorhexidine Gluconate Cloth  6 each Topical Daily   citalopram  20 mg Oral Daily   famotidine  20 mg Oral Daily   mouth rinse  15 mL Mouth Rinse BID   melatonin  3 mg Oral QHS   metoprolol tartrate  25 mg Oral BID   sodium chloride flush  3 mL Intravenous Q12H   Continuous Infusions:  PRN Meds: acetaminophen, HYDROmorphone, ondansetron **OR** ondansetron (ZOFRAN) IV   Vital Signs    Vitals:   01/05/21 1937 01/05/21 2242 01/06/21 0343 01/06/21 0739  BP: 101/86 117/85 109/69 128/70  Pulse: (!) 106 (!) 111 65 66  Resp: 20 20 16 18   Temp: 98.3 F (36.8 C) 98.3 F (36.8 C) 98.3 F (36.8 C)   TempSrc: Oral Oral Oral Oral  SpO2: 93% 92% 99% 99%  Weight:   (!) 142.5 kg   Height:        Intake/Output Summary (Last 24 hours) at 01/06/2021 0837 Last data filed at 01/05/2021 2241 Gross per 24 hour  Intake 730 ml  Output 1750 ml  Net -1020 ml   Filed Weights   01/04/21 0320 01/05/21 0305 01/06/21 0343  Weight: (!) 140.2 kg (!) 141.9 kg (!) 142.5 kg    Telemetry    Atrial fib/flutter rates 60s  Personally Reviewed  Physical Exam   General: Obese female in NAD HEENT: OP clear, mucus membranes moist  SKIN: Facial bruising Neuro: No focal deficits  Musculoskeletal: Muscle strength 5/5 all ext  Psychiatric: Mood and affect normal  Lungs:Clear bilaterally, no wheezes, rhonci, crackles Cardiovascular: Irregular. No murmurs, gallops or rubs. Abdomen:Soft. Bowel sounds present. Non-tender.  Extremities: No lower extremity edema.  Labs    Chemistry Recent Labs  Lab 01/02/21 0523 01/03/21 0545 01/04/21 0043 01/05/21 0127 01/05/21 0821 01/06/21 0112  NA 131* 132* 132* 134*  --  132*  K 5.7* 4.7 5.0 5.0  --  4.6  CL 98 100 101 102  --  101  CO2 25 26 24 24   --  24  GLUCOSE 99 91 91 89  --  100*  BUN 56* 49* 42* 39*  --  37*  CREATININE 1.92* 1.42*  1.23* 1.18*  --  1.05*  CALCIUM 8.4* 8.5* 8.9 9.0  --  8.9  PROT 5.3* 5.7*  --   --  6.3*  --   ALBUMIN 2.3* 2.5*  --   --  2.4*  --   AST 27 33  --   --  59*  --   ALT 12 18  --   --  34  --   ALKPHOS 49 50  --   --  102  --   BILITOT 1.8* 2.4*  --   --  2.5*  --   GFRNONAA 29* 42* 50* 53*  --  >60  ANIONGAP 8 6 7 8   --  7     Hematology Recent Labs  Lab 01/04/21 0043 01/05/21 0127 01/05/21 0821 01/06/21 0112  WBC 11.6* 9.5  --  9.9  RBC 2.38* 2.44*  --  2.39*  HGB 7.4* 7.4*  --  7.2*  HCT 23.1* 22.6*  --  23.6*  MCV 97.1 92.6  --  98.7  MCH 31.1 30.3  --  30.1  MCHC 32.0 32.7  --  30.5  RDW 18.2* 17.6*  --  18.0*  PLT  244 60* 35 324     Patient ID  60 y.o. female with a hx of valvular heart disease s/p MVR/AVR w/17mm mechanical mitral valve and 19 mm mechanical aortic valve, ILD, polycythemia, CKD stage II-III, HTN, HLD, OSA and morbid obesity who is being seen 12/30/2020 for the evaluation of bleeding/need for [facility] at the request of Dr. Jimmye Norman. Blood loss anemia due to abdominal wall hematoma. Coumadin has been held. New onset atrial fib 01/04/21.  Assessment & Plan    1.  Afib/ atrial flutter: She has been seen by our EP team, Dr. Rayann Heman. Her atrial fib is felt to be due to her medical illness. She has not been on anti-coagulation due to anemia. Heart rates now in the 60s.  -Continue current dose of metoprolol.  -Consider anti-coagulation when safe  2. S/p MVR/AVR: Coumadin is on hold. Resume when safe from a surgical perspective. Will defer to surgical and primary team. Reasonable to consider pRBC transfusion prior to starting coumadin.   3. Anemia: This is felt to be due to blood loss post op. Stable.   4. Acute renal failure: Resolving. Nephrology following.    Cardiology will sign off. Please call with questions.   Lauree Chandler 01/06/2021 8:37 AM

## 2021-01-07 LAB — BPAM RBC
Blood Product Expiration Date: 202209122359
ISSUE DATE / TIME: 202208091430
Unit Type and Rh: 5100

## 2021-01-07 LAB — CBC
HCT: 29.1 % — ABNORMAL LOW (ref 36.0–46.0)
Hemoglobin: 9.2 g/dL — ABNORMAL LOW (ref 12.0–15.0)
MCH: 31.1 pg (ref 26.0–34.0)
MCHC: 31.6 g/dL (ref 30.0–36.0)
MCV: 98.3 fL (ref 80.0–100.0)
Platelets: 411 10*3/uL — ABNORMAL HIGH (ref 150–400)
RBC: 2.96 MIL/uL — ABNORMAL LOW (ref 3.87–5.11)
RDW: 17.4 % — ABNORMAL HIGH (ref 11.5–15.5)
WBC: 11.3 10*3/uL — ABNORMAL HIGH (ref 4.0–10.5)
nRBC: 0.4 % — ABNORMAL HIGH (ref 0.0–0.2)

## 2021-01-07 LAB — PROTIME-INR
INR: 1 (ref 0.8–1.2)
Prothrombin Time: 13.5 seconds (ref 11.4–15.2)

## 2021-01-07 LAB — TYPE AND SCREEN
ABO/RH(D): O POS
Antibody Screen: NEGATIVE
Unit division: 0

## 2021-01-07 LAB — BASIC METABOLIC PANEL
Anion gap: 8 (ref 5–15)
BUN: 29 mg/dL — ABNORMAL HIGH (ref 8–23)
CO2: 27 mmol/L (ref 22–32)
Calcium: 9.4 mg/dL (ref 8.9–10.3)
Chloride: 102 mmol/L (ref 98–111)
Creatinine, Ser: 0.98 mg/dL (ref 0.44–1.00)
GFR, Estimated: >60 mL/min (ref >60)
Glucose, Bld: 95 mg/dL (ref 70–99)
Potassium: 4.9 mmol/L (ref 3.5–5.1)
Sodium: 137 mmol/L (ref 135–145)

## 2021-01-07 MED ORDER — WARFARIN - PHARMACIST DOSING INPATIENT: Freq: Every day | Status: DC

## 2021-01-07 MED ORDER — WARFARIN SODIUM 4 MG PO TABS
4.0000 mg | ORAL_TABLET | Freq: Once | ORAL | Status: AC
  Administered 2021-01-07: 4 mg via ORAL
  Filled 2021-01-07: qty 1

## 2021-01-07 NOTE — Progress Notes (Signed)
   Subjective:    Patient reports pain as mild and moderate.    Objective: Vital signs in last 24 hours: Temp:  [97.9 F (36.6 C)-99.8 F (37.7 C)] 98.1 F (36.7 C) (08/10 1134) Pulse Rate:  [51-102] 51 (08/10 1134) Resp:  [17-20] 19 (08/10 1134) BP: (117-144)/(73-84) 121/76 (08/10 1134) SpO2:  [92 %-97 %] 96 % (08/10 1134) Weight:  [136.2 kg] 136.2 kg (08/10 0500)  Intake/Output from previous day: 08/09 0701 - 08/10 0700 In: 200 [P.O.:200] Out: 1200 [Urine:1200] Intake/Output this shift: Total I/O In: 480 [P.O.:480] Out: 600 [Urine:600]  Recent Labs    01/05/21 0127 01/06/21 0112 01/06/21 1951 01/07/21 0034  HGB 7.4* 7.2* 9.6* 9.2*   Recent Labs    01/06/21 0112 01/06/21 1951 01/07/21 0034  WBC 9.9  --  11.3*  RBC 2.39*  --  2.96*  HCT 23.6* 30.8* 29.1*  PLT 324  --  411*   Recent Labs    01/06/21 0112 01/07/21 0034  NA 132* 137  K 4.6 4.9  CL 101 102  CO2 24 27  BUN 37* 29*  CREATININE 1.05* 0.98  GLUCOSE 100* 95  CALCIUM 8.9 9.4   Recent Labs    01/06/21 0112 01/07/21 0034  INR 1.1 1.0    Neurologically intact No results found.  Assessment/Plan:    Plan: walking in hall with walker and 1 L O2.  Can follow up with me in a week in office.    [name] 01/07/2021, 3:08 PM

## 2021-01-07 NOTE — Progress Notes (Signed)
Mobility Specialist: Progress Note   01/07/21 1818  Mobility  Activity Ambulated in hall  Level of Assistance Standby assist, set-up cues, supervision of patient - no hands on  Assistive Device Four wheel walker  Distance Ambulated (ft) 260 ft  Mobility Ambulated with assistance in hallway  Mobility Response Tolerated well  Mobility performed by Mobility specialist  $Mobility charge 1 Mobility   Pre-Mobility: 96 HR, 91% SpO2 During Mobility: 125 HR, 93% SpO2 Post-Mobility: 124 HR, 91% SpO2  Pt sitting EOB upon entering room and agreeable to ambulation. Pt stopped for three short seated breaks d/t SOB, otherwise asx throughout. Pt sitting EOB after walk with call bell at her side and pt's sister present in the room.   [facility] Ethyle Tiedt Mobility Specialist Mobility Specialist Phone: [phone]
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
## 2021-01-07 NOTE — Progress Notes (Signed)
Occupational Therapy Treatment Patient Details Name: [name] MRN: [id number] DOB: April 30, 1961 Today's Date: 01/07/2021    History of present illness 60yo female admitted 12/30/20 with complaints of malaise and L hip pain. Of note, she received a maxillary bone graft on 12/26/20 and the graft donor site was her L iliac crest. Found to be in hypovolemic shock with multiple intra-abdominal hematomas. PMH A-flutter, A-fib, Bell's palsy, CAO, ILD, CHF, gout, HTN, HLD, lymphedema, obesity, valvular heart disease s/p MVR/AVR   OT comments  Pt is making excellent progress toward OT goals and is now able to complete OT ADLs with supervision - min A. Discharge disposition changed to home with intermittent supervision.  She will benefit from a 3in1 commode for home use and will benefit from AE to assist with LB ADLs.   Follow Up Recommendations  No OT follow up;Supervision - Intermittent    Equipment Recommendations  3 in 1 bedside commode    Recommendations for Other Services      Precautions / Restrictions Precautions Precautions: Fall;Other (comment) Precaution Comments: obesity, L hip bone graft site, watch sats; Rt LE lymphedema       Mobility Bed Mobility                    Transfers                      Balance Overall balance assessment: Needs assistance Sitting-balance support: Feet supported;No upper extremity supported Sitting balance-Leahy Scale: Good     Standing balance support: No upper extremity supported Standing balance-Leahy Scale: Fair                             ADL either performed or assessed with clinical judgement   ADL Overall ADL's : Needs assistance/impaired     Grooming: Wash/dry hands;Wash/dry face;Oral care;Brushing hair;Supervision/safety;Standing   Upper Body Bathing: Sitting;Set up   Lower Body Bathing: Supervison/ safety;Minimal assistance   Upper Body Dressing : Set up;Sitting   Lower Body Dressing:  Supervision/safety;Sit to/from stand   Toilet Transfer: Supervision/safety;Ambulation;Comfort height toilet;Grab bars;RW   Toileting- Clothing Manipulation and Hygiene: Supervision/safety;Sit to/from stand       Functional mobility during ADLs: Supervision/safety;Rolling walker       Vision       Perception     Praxis      Cognition Arousal/Alertness: Awake/alert Behavior During Therapy: WFL for tasks assessed/performed Overall Cognitive Status: Within Functional Limits for tasks assessed                                          Exercises Other Exercises Other Exercises: discussed DME needs for home and options for AE with pt.  She does not want a tub seat.   Shoulder Instructions       General Comments sp02 85% on RA with activity, but rebounds to 94% on 2L supplemental 02    Pertinent Vitals/ Pain       Pain Assessment: No/denies pain  Home Living                                          Prior Functioning/Environment  Frequency  Min 2X/week        Progress Toward Goals  OT Goals(current goals can now be found in the care plan section)  Progress towards OT goals: Progressing toward goals     Plan Discharge plan needs to be updated;Equipment recommendations need to be updated    Co-evaluation                 AM-PAC OT "6 Clicks" Daily Activity     Outcome Measure   Help from another person eating meals?: None Help from another person taking care of personal grooming?: A Little Help from another person toileting, which includes using toliet, bedpan, or urinal?: A Little Help from another person bathing (including washing, rinsing, drying)?: A Little Help from another person to put on and taking off regular upper body clothing?: A Little Help from another person to put on and taking off regular lower body clothing?: A Little 6 Click Score: 19    End of Session Equipment Utilized During  Treatment: Oxygen  OT Visit Diagnosis: Unsteadiness on feet (R26.81)   Activity Tolerance Patient tolerated treatment well   Patient Left in chair;with call bell/phone within reach   Nurse Communication Mobility status        Time: 1213-1229 OT Time Calculation (min): 16 min  Charges: OT General Charges $OT Visit: 1 Visit OT Treatments $Self Care/Home Management : 8-22 mins  Nilsa Nutting., OTR/L Acute Rehabilitation Services Pager [phone] Office [phone]    Lucille Passy M 01/07/2021, 1:16 PM

## 2021-01-07 NOTE — Progress Notes (Signed)
Pt doesn't want to wear CPAP for the night. 

## 2021-01-07 NOTE — Progress Notes (Signed)
Physical Therapy Treatment Patient Details Name: [name] MRN: [id number] DOB: 07/07/1960 Today's Date: 01/07/2021    History of Present Illness 60yo female admitted 12/30/20 with complaints of malaise and L hip pain. Of note, she received a maxillary bone graft on 12/26/20 and the graft donor site was her L iliac crest. Found to be in hypovolemic shock with multiple intra-abdominal hematomas. PMH A-flutter, A-fib, Bell's palsy, CAO, ILD, CHF, gout, HTN, HLD, lymphedema, obesity, valvular heart disease s/p MVR/AVR    PT Comments    Pt received on BSC, agreeable to therapy session and with good participation and tolerance for transfer and gait training with rollator walker. Pt SpO2 WNL on 1L O2 [place] with exertion and HR elevated to 120's bpm with exertion. Of note, pt SpO2 desat to 86-88% on 1L ~5 mins after session and needed to increase wall O2 to 2L [place] to improve sats to 90% and greater. Encouraged IS use 10x/hr, pt able to achieve >1000 mL. Pt continues to benefit from PT services to progress toward functional mobility goals.  Discharge recommendation updated to HHPT per discussion with pt and supervising PT Dacia B.  Follow Up Recommendations  Supervision for mobility/OOB;Home health PT     Equipment Recommendations  Other (comment) (bariatric rollator)    Recommendations for Other Services       Precautions / Restrictions Precautions Precautions: Fall;Other (comment) Precaution Comments: obesity, L hip bone graft site, watch sats; R LE lymphedema Restrictions Weight Bearing Restrictions: No    Mobility  Bed Mobility Overal bed mobility: Needs Assistance Bed Mobility: Sit to Supine       Sit to supine: Min guard   General bed mobility comments: min guard with increased time and use of rails    Transfers Overall transfer level: Needs assistance Equipment used: 4-wheeled walker Transfers: Sit to/from [facility] Sit to Stand: Min guard Stand pivot  transfers: Min guard       General transfer comment: from [facility], rollator seat and EOB to 4WW  Ambulation/Gait Ambulation/Gait assistance: Min guard;Supervision Gait Distance (Feet): 150 Feet (15ft, seated break, 61ft) Assistive device: 4-wheeled walker Gait Pattern/deviations: Step-through pattern;Decreased stride length Gait velocity: decreased Gait velocity interpretation: <1.8 ft/sec, indicate of risk for recurrent falls General Gait Details: safety cues for posture/proximity to rollator with fair carryover.      Balance Overall balance assessment: Needs assistance Sitting-balance support: Feet supported;No upper extremity supported Sitting balance-Leahy Scale: Good     Standing balance support: No upper extremity supported Standing balance-Leahy Scale: Fair Standing balance comment: able to stand and don mask briefly but needing UE support for dynamic standing tasks      Cognition Arousal/Alertness: Awake/alert Behavior During Therapy: WFL for tasks assessed/performed Overall Cognitive Status: Within Functional Limits for tasks assessed    General Comments: jovial and pleasant, needs increased time to "mentally prepare" for mobility      Exercises Other Exercises Other Exercises: discussed DME needs for home and options for AE with pt.  She does not want a tub seat.    General Comments General comments (skin integrity, edema, etc.): SpO2 91-98% on 1L O2 [place] with mobility (had been on 2L prior to mobility but SpO2 97-100% on 2L so RN OK 1L decrease; HR 100-121 bpm with exertion      Pertinent Vitals/Pain Pain Assessment: 0-10 Pain Score: 9  Pain Location: L hip at graft site and RLE "tingling" from sitting in chair for extended time Pain Descriptors / Indicators: Aching;Discomfort;Tingling;Numbness Pain Intervention(s):  Limited activity within patient's tolerance;Monitored during session;Repositioned;Patient requesting pain meds-RN notified     PT Goals (current  goals can now be found in the care plan section) Acute Rehab PT Goals Patient Stated Goal: to get back to normal PT Goal Formulation: With patient Time For Goal Achievement: 01/17/21 Progress towards PT goals: Progressing toward goals    Frequency    Min 3X/week      PT Plan Discharge plan needs to be updated   AM-PAC PT "6 Clicks" Mobility   Outcome Measure  Help needed turning from your back to your side while in a flat bed without using bedrails?: A Little Help needed moving from lying on your back to sitting on the side of a flat bed without using bedrails?: A Little Help needed moving to and from a bed to a chair (including a wheelchair)?: A Little Help needed standing up from a chair using your arms (e.g., wheelchair or bedside chair)?: A Little Help needed to walk in hospital room?: A Little Help needed climbing 3-5 steps with a railing? : A Lot 6 Click Score: 17    End of Session Equipment Utilized During Treatment: Oxygen Activity Tolerance: Patient tolerated treatment well Patient left: in bed;with call bell/phone within reach;with bed alarm set (pt instructed to use call bell prior to OOB mobility) Nurse Communication: Mobility status;Patient requests pain meds (she wants tylenol) PT Visit Diagnosis: Unsteadiness on feet (R26.81);Muscle weakness (generalized) (M62.81);Difficulty in walking, not elsewhere classified (R26.2);Pain Pain - Right/Left: Left Pain - part of body: Hip     Time: 1415-1447 PT Time Calculation (min) (ACUTE ONLY): 32 min  Charges:  $Gait Training: 23-37 mins                     Ahmyah Gidley P., PTA Acute Rehabilitation Services Pager: [phone] Office: [place] 01/07/2021, 3:05 PM

## 2021-01-07 NOTE — Progress Notes (Signed)
Occupational Therapy Progress Note  Pt instructed in use of AE for LB ADLs, use of walker bag, energy conservation strategies, and safety with 02 tubing for home.  Pt able to verbalize/demonstrate understanding.      01/07/21 1357  OT Visit Information  Last OT Received On 01/07/21  Assistance Needed +1  History of Present Illness 60yo female admitted 12/30/20 with complaints of malaise and L hip pain. Of note, she received a maxillary bone graft on 12/26/20 and the graft donor site was her L iliac crest. Found to be in hypovolemic shock with multiple intra-abdominal hematomas. PMH A-flutter, A-fib, Bell's palsy, CAO, ILD, CHF, gout, HTN, HLD, lymphedema, obesity, valvular heart disease s/p MVR/AVR  Precautions  Precautions Fall;Other (comment)  Precaution Comments obesity, L hip bone graft site, watch sats; Rt LE lymphedema  Pain Assessment  Pain Assessment No/denies pain  Cognition  Arousal/Alertness Awake/alert  Behavior During Therapy WFL for tasks assessed/performed  Overall Cognitive Status Within Functional Limits for tasks assessed  Upper Extremity Assessment  Upper Extremity Assessment Generalized weakness  Lower Extremity Assessment  Lower Extremity Assessment Defer to PT evaluation  ADL  Toilet Transfer Supervision/safety;Ambulation;BSC;RW  Toileting- Clothing Manipulation and Hygiene Supervision/safety  Toileting - Clothing Manipulation Details (indicate cue type and reason) supervision for anterior peri care, but requires min A for posterior peri care.  Discussed use of toileting aid.  Pt reports she has one from a previous admission, and is not interested in using.  She reports she will use peri wipes at home, or have someone assist her  General ADL Comments Pt provided with LH bath sponge and a reacher.  She was instructed in use of both.  She was instructed to use a walker bag on her walker at home to carry items.  Reviewed energy conservation strategies  General Comments   General comments (skin integrity, edema, etc.) sp02 95% on 2L 02  OT - End of Session  Equipment Utilized During Treatment Oxygen;Rolling walker  Activity Tolerance Patient tolerated treatment well  Patient left Other (comment) (on [facility])  Nurse Communication Mobility status  OT Assessment/Plan  OT Plan Discharge plan remains appropriate  OT Visit Diagnosis Unsteadiness on feet (R26.81)  OT Frequency (ACUTE ONLY) Min 2X/week  Follow Up Recommendations No OT follow up;Supervision - Intermittent  OT Equipment 3 in 1 bedside commode  AM-PAC OT "6 Clicks" Daily Activity Outcome Measure (Version 2)  Help from another person eating meals? 4  Help from another person taking care of personal grooming? 3  Help from another person toileting, which includes using toliet, bedpan, or urinal? 3  Help from another person bathing (including washing, rinsing, drying)? 3  Help from another person to put on and taking off regular upper body clothing? 3  Help from another person to put on and taking off regular lower body clothing? 3  6 Click Score 19  Progressive Mobility  What is the highest level of mobility based on the progressive mobility assessment? Level 5 (Walks with assist in room/hall) - Balance while stepping forward/back and can walk in room with assist - Complete  Mobility Out of bed for toileting;Out of bed to chair with meals;Ambulated with assistance in room;Ambulated with assistance in hallway  OT Goal Progression  Progress towards OT goals Progressing toward goals  OT Time Calculation  OT Start Time (ACUTE ONLY) 1325  OT Stop Time (ACUTE ONLY) 1336  OT Time Calculation (min) 11 min  OT General Charges  $OT Visit 1 Visit  OT  Treatments  $Self Care/Home Management  8-22 mins  Nilsa Nutting., OTR/L Acute Rehabilitation Services Pager [phone] Office [phone]

## 2021-01-07 NOTE — Progress Notes (Addendum)
Subjective:  I saw and evaluated [name] at bedside.  She states she is doing well this morning.  Her pain is well controlled.  She denies shortness of breath, chest pain, or palpitations.  She reports minimal abdominal discomfort.  She states she was able to get up and walk down the hall with PT and OT yesterday.  She did not require increased oxygen via nasal cannula.   Objective:  Vital signs in last 24 hours: Vitals:   01/06/21 1928 01/06/21 2318 01/07/21 0340 01/07/21 0500  BP: 120/80 117/74 130/78   Pulse: 74 83 70   Resp: 18 20 18    Temp: 97.9 F (36.6 C) 98.4 F (36.9 C) 98.4 F (36.9 C)   TempSrc: Oral Oral Oral   SpO2: 96% 95% 95%   Weight:    (!) 136.2 kg  Height:       Physical Exam HENT:     Head: Normocephalic and atraumatic.     Nose:     Comments: Nasal cannula present Cardiovascular:     Rate and Rhythm: Normal rate.     Comments: Audible valve click Pulmonary:     Effort: Pulmonary effort is normal.     Breath sounds: Normal breath sounds.  Abdominal:     Comments: Minimal tenderness upon palpation of LLQ. Decreased bruising noted on LLQ. Less rigid upon palpation  Musculoskeletal:     Comments: Bruising noted along axilla and left scapula  Skin:    General: Skin is warm and dry.  Neurological:     General: No focal deficit present.     Mental Status: She is alert.  Psychiatric:        Behavior: Behavior normal. Behavior is cooperative.     Assessment/Plan:  Principal Problem:   Hematoma of abdominal wall Active Problems:   History of mitral valve replacement with mechanical valve   Chronic diastolic heart failure (HCC)   Acute kidney injury superimposed on CKD (HCC)   Hypovolemic shock (HCC)  Hematoma of Abdominal Wall Hemoglobin has been steady range 7.4-7.2. Transfused 1 unit of blood yesterday to increase hgb to 9  in order to restart coumadin. Per cardiology recommendation, acceptable hgb of 9 in order to restart  coumadin. --Current Hgb 9.2; patient is at goal to restart coumadin today; Will not bridging her anticoagulation due to recent severe bleeding in the past with heparin bridge with the understanding that patient remains at risk given her mechanical valve.  -- Coumadin restart per pharmacy ---Anticipating 5-7 day hospital stay to reach coumadin therapeutic range.  --- Dilaudid oral 1mg  q4h PRN for pain   Acute on chronic respiratory failure Sinus tachycardia with intermittent atrial fibrillation/flutter Repeat EKG revealed a. Flutter with variable AV block. Chest x-ray obtained revealed left lower opacity, atelectasis vs PNA. She currently is not showing other signs of infection, afebrile and WBC WNL. Blood pressures have remained appropriate, which is reassuring. Will continue to monitor throughout today, watch for signs of infection, worsening hypoxia. CPAP ordered, patient refused CPAP machine at bedtime per respiratory therapist. - Continue telemetry - Continue home metoprolol 25mg  twice daily -- Monitor Oxygen saturation >92% --Coumadin restart per pharmacy  Non-oliguric acute kidney injury on CKD Urinary retention Creatinine continues to improve, currently WNL at 0.98.  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 Recheck  showed normal platelets, however, her fibrinogen and D dimer were elevated.  These can be explained by FFP infusion which she received previously.  Will monitor closely for any further drop in platelets.    --Repeat CBC in the AM Prior to Admission Living Arrangement: Anticipated Discharge Location: Barriers to Discharge: Dispo: Anticipated discharge in approximately 2-3 day(s).   Timothy Lasso, MD 01/07/2021, 6:43 AM Pager: [phone] After 5pm on weekdays and 1pm on weekends:  On Call pager [phone]

## 2021-01-07 NOTE — Progress Notes (Signed)
Inpatient Rehab Admissions Coordinator:   Continuing to follow for medical and mobility progression.  Shann Medal, PT, DPT Admissions Coordinator [phone] 01/07/21  10:46 AM

## 2021-01-08 LAB — BASIC METABOLIC PANEL
Anion gap: 8 (ref 5–15)
BUN: 28 mg/dL — ABNORMAL HIGH (ref 8–23)
CO2: 24 mmol/L (ref 22–32)
Calcium: 9.4 mg/dL (ref 8.9–10.3)
Chloride: 106 mmol/L (ref 98–111)
Creatinine, Ser: 1.1 mg/dL — ABNORMAL HIGH (ref 0.44–1.00)
GFR, Estimated: 57 mL/min — ABNORMAL LOW (ref >60)
Glucose, Bld: 88 mg/dL (ref 70–99)
Potassium: 4.7 mmol/L (ref 3.5–5.1)
Sodium: 138 mmol/L (ref 135–145)

## 2021-01-08 LAB — CBC
HCT: 29.1 % — ABNORMAL LOW (ref 36.0–46.0)
Hemoglobin: 8.8 g/dL — ABNORMAL LOW (ref 12.0–15.0)
MCH: 30.2 pg (ref 26.0–34.0)
MCHC: 30.2 g/dL (ref 30.0–36.0)
MCV: 100 fL (ref 80.0–100.0)
Platelets: 396 10*3/uL (ref 150–400)
RBC: 2.91 MIL/uL — ABNORMAL LOW (ref 3.87–5.11)
RDW: 17.5 % — ABNORMAL HIGH (ref 11.5–15.5)
WBC: 11.6 10*3/uL — ABNORMAL HIGH (ref 4.0–10.5)
nRBC: 0.3 % — ABNORMAL HIGH (ref 0.0–0.2)

## 2021-01-08 LAB — PROTIME-INR
INR: 1.1 (ref 0.8–1.2)
Prothrombin Time: 13.9 seconds (ref 11.4–15.2)

## 2021-01-08 MED ORDER — WARFARIN SODIUM 5 MG PO TABS
5.0000 mg | ORAL_TABLET | Freq: Once | ORAL | Status: AC
  Administered 2021-01-08: 5 mg via ORAL
  Filled 2021-01-08: qty 1

## 2021-01-08 MED ORDER — FUROSEMIDE 20 MG PO TABS
20.0000 mg | ORAL_TABLET | Freq: Every day | ORAL | Status: DC
  Administered 2021-01-08 – 2021-01-09 (×2): 20 mg via ORAL
  Filled 2021-01-08 (×2): qty 1

## 2021-01-08 NOTE — Progress Notes (Signed)
Mobility Specialist: Progress Note   01/08/21 1516  Mobility  Activity Ambulated in hall  Level of Assistance Modified independent, requires aide device or extra time  Assistive Device Four wheel walker  Distance Ambulated (ft) 240 ft  Mobility Response Tolerated well  Mobility performed by Mobility specialist  $Mobility charge 1 Mobility   Pre-Mobility: 120 HR, 97% SpO2 During Mobility: 123 HR, 94% SpO2 Post-Mobility: 123 HR, 93% SpO2  Pt to BSC and then agreeable to ambulate. Pt stopped to take three seated breaks during ambulation d/t feeling SOB, otherwise asx. Pt back to bed after walk with call bell and phone at her side.   [facility] [place] Mobility Specialist Mobility Specialist Phone: [phone]

## 2021-01-08 NOTE — Progress Notes (Signed)
Subjective: I seen and evaluated [name] at bedside.  At the beginning of the interview [name] was finishing up with PT.  I was able to observe her walking using her walker down the hospital hallway.  She reports slight shortness of breath but appeared to be in no acute distress.  Upon entering the room [name] sat in the chair next to the bed and was breathing room air.  She states that she is feeling much better.  She describes her abdominal pain scale as 2/10.  She reports only needing Tylenol currently for relief.  She states that she has been voiding since removal of Foley and reports no urinary urgency.  She denies headache, changes in her vision, chest pain, or shortness of breath.  She reports swelling of her left lower leg, which she noticed began last night.  She denies pain or muscle cramping of the lower extremities. Objective:  Vital signs in last 24 hours: Vitals:   01/07/21 2313 01/08/21 0404 01/08/21 0711 01/08/21 1101  BP: 124/89 135/63 125/72 (!) 92/52  Pulse: 81 84 78 74  Resp: (!) 21 19 18 19   Temp: 98 F (36.7 C) 98.3 F (36.8 C) 98.4 F (36.9 C) 98 F (36.7 C)  TempSrc: Oral Oral Oral Oral  SpO2: 93% 90% 92% 92%  Weight:  134.9 kg    Height:       Physical Exam Constitutional:      General: She is awake.  HENT:     Head: Normocephalic and atraumatic.  Eyes:     Comments: Minimal periorbital ecchymosis along the bottom eyelid.  Cardiovascular:     Rate and Rhythm: Rhythm irregularly irregular.     Comments: Audible valve clicks Pulmonary:     Effort: Pulmonary effort is normal.     Breath sounds: Normal breath sounds.  Abdominal:     Comments: Tense rigidity along LLQ. No pain elicited with palpation  Musculoskeletal:     Comments: Bruising noted along lower left side of back.  No pain elicited with palpation.  Skin:    General: Skin is warm and dry.     Coloration: Skin is jaundiced.  Neurological:     General: No focal deficit  present.     Mental Status: She is alert.  Psychiatric:        Attention and Perception: Attention normal.        Behavior: Behavior normal. Behavior is cooperative.     Assessment/Plan:  Principal Problem:   Hematoma of abdominal wall Active Problems:   History of mitral valve replacement with mechanical valve   Chronic diastolic heart failure (HCC)   Acute kidney injury superimposed on CKD ([place])   Hypovolemic shock (HCC)  Hematoma of Abdominal Wall Patient has begun Coumadin yesterday.  Current hemoglobin 8.8, decreased from 9.2 (yesterday).  Current PT/INR at 13.9/1.1.  Per pharmacy, goal is to reach Coumadin therapeutic range 2.5-3.5.  Anticipate this to take anywhere from 5 to 7 days.  No bridging was done upon start of Coumadin due to high risk of bleeding. --Coumadin dosing: 7.5 MWF and 3.75 other days --Repeat PT/INR daily --Repeat CBC in the AM --- Dilaudid oral 1mg  q4h PRN for pain   Acute on chronic respiratory failure Sinus tachycardia with intermittent atrial fibrillation/flutter Telemetry revealed a. Flutter, unchanged from previous day.  Patient has been breathing room air since last night.  Use 2L oxygen as needed. -- Continue telemetry -- Continue home metoprolol 25mg  twice daily --  Monitor Oxygen saturation >92% -- Continue Coumadin regimen as stated above   Non-oliguric acute kidney injury on CKD Urinary retention, Resolved Creatinine slightly increased to 1.05 from 0.98.  Patient has been voiding since Foley removal yesterday.   - Hold nephrotoxins - Repeat BMP in the AM   Mechanical AVR,MVR -Continue Coumadin regimen as stated above.   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 Recheck showed normal platelets, however, her fibrinogen and D dimer were elevated.  These can be explained by FFP infusion which she received previously.  Will monitor closely for any further drop in platelets.  Platelet  count have remained stable, current platelet count 396. --Repeat CBC in the AM  Lower Extremity Edema Patient reports noticing swelling of her lower left leg that began last night.  On physical exam 2+ pitting edema noted on the left lower leg.  Dorsalis pedis pulse intact, +2.  Right leg suffers from lymphedema, unchanged since admission. -- Started on 20 mg Lasix daily, will titrate up carefully if swelling does not improve.  Prior to Admission Living Arrangement: Anticipated Discharge Location: Barriers to Discharge: Dispo: Anticipated discharge in approximately 5-7 day(s).   Timothy Lasso, MD 01/08/2021, 2:41 PM Pager: [phone] After 5pm on weekdays and 1pm on weekends: On Call pager [phone]

## 2021-01-08 NOTE — Progress Notes (Signed)
Physical Therapy Treatment Patient Details Name: [name] MRN: [id number] DOB: [birth year] Today's Date: 01/08/2021    History of Present Illness 60yo female admitted 12/30/20 with complaints of malaise and L hip pain. Of note, she received a maxillary bone graft on 12/26/20 and the graft donor site was her L iliac crest. Found to be in hypovolemic shock with multiple intra-abdominal hematomas. PMH A-flutter, A-fib, Bell's palsy, CAO, ILD, CHF, gout, HTN, HLD, lymphedema, obesity, valvular heart disease s/p MVR/AVR.    PT Comments    Pt received standing EOB with NT, agreeable to therapy session and with good participation and tolerance for household distance gait trials x2 using RW and seated/reclined LE exercise instruction. Discussed importance of frequent repositioning due to RLE lymphedema and compliance with HEP to prevent worsening of lymph build-up in dependent extremity. Pt continues to benefit from PT services to progress toward functional mobility goals.    Follow Up Recommendations  Supervision for mobility/OOB;Home health PT     Equipment Recommendations  Other (comment) (bariatric rollator)    Recommendations for Other Services       Precautions / Restrictions Precautions Precautions: Fall;Other (comment) Precaution Comments: obesity, L hip bone graft site, watch sats; R LE lymphedema Restrictions Weight Bearing Restrictions: No    Mobility  Bed Mobility               General bed mobility comments: pt received up in room with NT at bedside    Transfers Overall transfer level: Needs assistance Equipment used: Rolling walker (2 wheeled) Transfers: Sit to/from [facility] Sit to Stand: Min guard         General transfer comment: from EOB and recliner, cues for UE placement/not to pull up on RW.  Ambulation/Gait Ambulation/Gait assistance: Supervision Gait Distance (Feet): 80 Feet (x2 with seated break) Assistive device: Rolling  walker (2 wheeled) Gait Pattern/deviations: Step-through pattern;Decreased stride length Gait velocity: decreased   General Gait Details: safety cues for posture/proximity to RW with fair carryover.   Stairs             Wheelchair Mobility    Modified Rankin (Stroke Patients Only)       Balance Overall balance assessment: Needs assistance Sitting-balance support: Feet supported;No upper extremity supported Sitting balance-Leahy Scale: Good     Standing balance support: No upper extremity supported Standing balance-Leahy Scale: Fair Standing balance comment: able to stand and don mask briefly but needing UE support for dynamic standing tasks                            Cognition Arousal/Alertness: Awake/alert Behavior During Therapy: WFL for tasks assessed/performed Overall Cognitive Status: Within Functional Limits for tasks assessed                                 General Comments: jovial and pleasant      Exercises Other Exercises Other Exercises: seated BLE AROM: ankle pumps, LAQ, glute sets, quad sets x5-10 reps ea    General Comments General comments (skin integrity, edema, etc.): discussed lymphedema precautions/repositioning and ROM exercises frequently to reduce risk of lymph fluid backup/pooling; HR elevated 90-120 bpm with exertion (hx Afib?); SpO2 93-97% on RA with exertion; BP reading 78-53 (61) on R brachial with large cuff so taken again on L forearm with regular cuff and reading 118/84 (96) RN notified.  Pertinent Vitals/Pain Pain Assessment: Faces Faces Pain Scale: Hurts a little bit Pain Location: L hip at graft site Pain Descriptors / Indicators: Aching;Discomfort Pain Intervention(s): Limited activity within patient's tolerance;Monitored during session;Repositioned    Home Living                      Prior Function            PT Goals (current goals can now be found in the care plan section) Acute  Rehab PT Goals Patient Stated Goal: to get back to normal PT Goal Formulation: With patient Time For Goal Achievement: 01/17/21 Progress towards PT goals: Progressing toward goals    Frequency    Min 3X/week      PT Plan Discharge plan needs to be updated    Co-evaluation              AM-PAC PT "6 Clicks" Mobility   Outcome Measure  Help needed turning from your back to your side while in a flat bed without using bedrails?: A Little Help needed moving from lying on your back to sitting on the side of a flat bed without using bedrails?: A Little Help needed moving to and from a bed to a chair (including a wheelchair)?: A Little Help needed standing up from a chair using your arms (e.g., wheelchair or bedside chair)?: A Little Help needed to walk in hospital room?: A Little Help needed climbing 3-5 steps with a railing? : A Lot 6 Click Score: 17    End of Session Equipment Utilized During Treatment: Gait belt Activity Tolerance: Patient tolerated treatment well Patient left: with call bell/phone within reach;in chair;with SCD's reapplied Nurse Communication: Mobility status PT Visit Diagnosis: Unsteadiness on feet (R26.81);Muscle weakness (generalized) (M62.81);Difficulty in walking, not elsewhere classified (R26.2);Pain Pain - Right/Left: Left Pain - part of body: Hip     Time: 4920-1007 PT Time Calculation (min) (ACUTE ONLY): 32 min  Charges:  $Gait Training: 8-22 mins $Therapeutic Exercise: 8-22 mins                     Alaira Level P., PTA Acute Rehabilitation Services Pager: [phone] Office: [place] 01/08/2021, 10:56 AM

## 2021-01-08 NOTE — Progress Notes (Signed)
Inpatient Rehab Admissions Coordinator:   Note recommendations have been updated to [facility] for PT and no f/u for OT.  Will sign off for CIR at this time.   Shann Medal, PT, DPT Admissions Coordinator [phone] 01/08/21  11:31 AM

## 2021-01-08 NOTE — Plan of Care (Signed)

## 2021-01-08 NOTE — Progress Notes (Signed)
Pt states she does not wear CPAP and does not want to wear CPAP.
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
## 2021-01-09 ENCOUNTER — Telehealth: Payer: Self-pay

## 2021-01-09 ENCOUNTER — Telehealth: Payer: Self-pay | Admitting: Interventional Cardiology

## 2021-01-09 LAB — BASIC METABOLIC PANEL
Anion gap: 8 (ref 5–15)
BUN: 25 mg/dL — ABNORMAL HIGH (ref 8–23)
CO2: 23 mmol/L (ref 22–32)
Calcium: 9.7 mg/dL (ref 8.9–10.3)
Chloride: 107 mmol/L (ref 98–111)
Creatinine, Ser: 1.18 mg/dL — ABNORMAL HIGH (ref 0.44–1.00)
GFR, Estimated: 53 mL/min — ABNORMAL LOW (ref >60)
Glucose, Bld: 95 mg/dL (ref 70–99)
Potassium: 4 mmol/L (ref 3.5–5.1)
Sodium: 138 mmol/L (ref 135–145)

## 2021-01-09 LAB — CBC
HCT: 29.7 % — ABNORMAL LOW (ref 36.0–46.0)
Hemoglobin: 9.2 g/dL — ABNORMAL LOW (ref 12.0–15.0)
MCH: 30.9 pg (ref 26.0–34.0)
MCHC: 31 g/dL (ref 30.0–36.0)
MCV: 99.7 fL (ref 80.0–100.0)
Platelets: 424 10*3/uL — ABNORMAL HIGH (ref 150–400)
RBC: 2.98 MIL/uL — ABNORMAL LOW (ref 3.87–5.11)
RDW: 17.8 % — ABNORMAL HIGH (ref 11.5–15.5)
WBC: 11 10*3/uL — ABNORMAL HIGH (ref 4.0–10.5)
nRBC: 0.3 % — ABNORMAL HIGH (ref 0.0–0.2)

## 2021-01-09 LAB — PROTIME-INR
INR: 1.1 (ref 0.8–1.2)
Prothrombin Time: 14.4 seconds (ref 11.4–15.2)

## 2021-01-09 LAB — GLUCOSE, CAPILLARY: Glucose-Capillary: 87 mg/dL (ref 70–99)

## 2021-01-09 MED ORDER — WARFARIN SODIUM 7.5 MG PO TABS: ORAL_TABLET | ORAL | 1 refills | Status: DC

## 2021-01-09 MED ORDER — WARFARIN SODIUM 5 MG PO TABS
5.0000 mg | ORAL_TABLET | Freq: Once | ORAL | Status: AC
  Administered 2021-01-09: 5 mg via ORAL
  Filled 2021-01-09: qty 1

## 2021-01-09 MED ORDER — CHLORHEXIDINE GLUCONATE 0.12 % MT SOLN: 15.0000 mL | Freq: Two times a day (BID) | OROMUCOSAL | 0 refills | Status: DC

## 2021-01-09 NOTE — Progress Notes (Signed)
Discharge instructions given to patient. Daughter present. PIV removed. Telemetry box removed, CCMD notified. MX40-02 placed in cubby. Personal items packed up and taken with patient in wheelchair to vehicle by staff.  Daymon Larsen, RN

## 2021-01-09 NOTE — Telephone Encounter (Signed)
Pt is calling to speak with a nurse in regards to her medical condition

## 2021-01-09 NOTE — Discharge Summary (Signed)
Name: [name] MRN: [id number] DOB: 13-Oct-1960 60 y.o. PCP: [name], [place]  Date of Admission: 12/30/2020  5:19 AM Date of Discharge: 01/09/2021 Attending Physician: Angelica Pou, MD  Discharge Diagnosis: 1. Hematoma of Abdominal Wall 2. Acute on chronic respiratory failure; Sinus tachycardia with intermittent atrial fibrillation/flutter 3. Mechanical AVR,MVR 4. Lower Extremity Edema  Discharge Medications: Allergies as of 01/09/2021   No Known Allergies      Medication List     STOP taking these medications    albuterol 108 (90 Base) MCG/ACT inhaler Commonly known as: ProAir HFA   amoxicillin 500 MG capsule Commonly known as: AMOXIL   enoxaparin 120 MG/0.8ML injection Commonly known as: LOVENOX   oxyCODONE 5 MG immediate release tablet Commonly known as: Oxy IR/ROXICODONE       TAKE these medications    acetaminophen 650 MG CR tablet Commonly known as: TYLENOL Take 1,300 mg by mouth daily as needed for pain or fever.   allopurinol 300 MG tablet Commonly known as: ZYLOPRIM Take 300 mg by mouth at bedtime.   buPROPion 300 MG 24 hr tablet Commonly known as: WELLBUTRIN XL Take 300 mg by mouth daily.   cetirizine 10 MG tablet Commonly known as: ZYRTEC Take 10 mg by mouth daily.   chlorhexidine 0.12 % solution Commonly known as: Peridex Use as directed 15 mLs in the mouth or throat 2 (two) times daily. What changed: Another medication with the same name was added. Make sure you understand how and when to take each.   chlorhexidine 0.12 % solution Commonly known as: Peridex Use as directed 15 mLs in the mouth or throat 2 (two) times daily. What changed: You were already taking a medication with the same name, and this prescription was added. Make sure you understand how and when to take each.   citalopram 20 MG tablet Commonly known as: CELEXA Take 20 mg by mouth daily.   furosemide 40 MG tablet Commonly known as: LASIX TAKE 2  TABLETS BY MOUTH IN THE AM AND 1 TABLET IN THE PM. What changed: See the new instructions.   irbesartan 150 MG tablet Commonly known as: Avapro Take 1 tablet (150 mg total) by mouth daily.   Klor-Con M20 20 MEQ tablet Generic drug: potassium chloride SA TAKE 1 TABLET BY MOUTH EVERY DAY What changed:  how much to take when to take this   montelukast 10 MG tablet Commonly known as: SINGULAIR Take 10 mg by mouth daily.   OXYGEN Inhale 2.5 L into the lungs daily as needed (SOB).   pravastatin 80 MG tablet Commonly known as: PRAVACHOL Take 80 mg by mouth daily.   Vitamin D3 50 MCG (2000 UT) Tabs Take 6,000 Units by mouth daily.   warfarin 7.5 MG tablet Commonly known as: COUMADIN Take as directed. If you are unsure how to take this medication, talk to your nurse or doctor. Original instructions: Take 7.5mg  (1 full tablet) Monday, Wednesday, Friday. Take 3.75mg  (half a tablet) Saturday, Tuesday, Thursday, Sunday. INR check once a week What changed: See the new instructions.       ASK your doctor about these medications    metoprolol tartrate 50 MG tablet Commonly known as: LOPRESSOR Take 0.5 tablets (25 mg total) by mouth 2 (two) times daily.               Discharge Care Instructions  (From admission, onward)           Start  Ordered   01/09/21 0000  Leave dressing on - Keep it clean, dry, and intact until clinic visit        01/09/21 1455            Disposition and follow-up:   [name] was discharged from [facility] in Good condition.  At the hospital follow up visit please address:  1.  Hematoma of Abdominal Wall- Coumadin has been resumed. Assess for signs of re-bleeding. Repeat CBC; Weekly PT/INR 2. Acute on chronic respiratory failure; Sinus tachycardia with intermittent atrial fibrillation/flutter- Patient experienced a flutter on telemetry during hospital course and was started on metoprolol 25 mg twice daily.  Can increase metoprolol dose back to 50mg  if rate is not controlled. 3. Mechanical AVR,MVR- During hospital course Coumadin was held due to bleeding.  Coumadin has been resumed continue to monitor INR weekly. 4. Lower Extremity Edema- continue Lasix for swelling   2.  Labs / imaging needed at time of follow-up: CBC; INR  3.  Pending labs/ test needing follow-up: None  Follow-up Appointments:  Follow-up Information     Marybelle Killings, MD Follow up in 1 week(s).   Specialty: Orthopedic Surgery Contact information: [place] [address] [phone]         Tomasa Hose, NP Follow up on 01/12/2021.   Specialty: Nurse Practitioner Contact information: [place] [id number] [phone]         Loel Dubonnet, NP Follow up on 02/13/2021.   Specialty: Cardiology Contact information: [address]  [place] [address] [phone]         Jodene Nam DMD Follow up in 1 week(s).   Contact information: [place] by problem list: 1. Hematoma of Abdominal Wall -presented to the ED with hypovolemic shock 2/2 hemorrhage status post dental procedure following heparin bridge to Coumadin. Coumadin has been on hold since admission due to continued bleeding into hematomas. H/H has held steady since patient received IV fluids, vitamin K, and FFP.  Last hemoglobin check was 9.2 and Coumadin was resumed.  No loading no bridge. 2. Acute on chronic respiratory failure; Sinus tachycardia with intermittent atrial fibrillation/flutter- Patient has a history of atrial fibrillation and EKG revealed new onset atrial flutter.  Patient started on metoprolol to control rate. 3. Mechanical AVR,MVR-Coumadin was held due to bleeding; but since has been resumed.  Audible clicks heard on physical exam consistent with valve replacement. 4. Lower Extremity Edema-Patient developed 2+ pitting edema of  the left lower leg.  No pain or muscle cramping associated.  Lasix resumed to control swelling.    Subjective: I seen and evaluated [name] at bedside.  She states she is feeling well and ready to go home.  She denies headache, chest pain, shortness of breath, abdominal discomfort, pain or cramping of the lower extremities.  [name] states she understands leaving the hospital will require her to follow-up with cardiology, her PCP, her dentist, and weekly INR checks.  She acknowledges and agrees.  Discharge Exam:   BP 133/84 (BP Location: Left Wrist)   Pulse 73   Temp 98.2 F (36.8 C) (Oral)   Resp 20   Ht 5\' 6"  (1.676 m)   Wt 131.5 kg   SpO2 90%   BMI 46.79 kg/m  Discharge exam: Physical Exam HENT:     Head: Normocephalic and  atraumatic.  Cardiovascular:     Rate and Rhythm: Rhythm irregularly irregular.     Comments: Audible clicks heard on auscultation. Pulmonary:     Effort: Pulmonary effort is normal.     Breath sounds: Normal breath sounds.  Abdominal:     Comments: Hematoma localized to the LLQ of abdomen. decreased abdominal rigidity and minimal pain to palpation.  Bruising noted along left lower quadrant and extends to axilla and lower left back.  Outer abdominal mesh still present on left lower quadrant.  Area around mesh is clean and dry and shows no sign of infection.   Musculoskeletal:     Left lower leg: 1+ Edema present.     Comments: Lymphedema present on right leg.  Skin:    General: Skin is warm and dry.  Neurological:     General: No focal deficit present.     Mental Status: She is alert.  Psychiatric:        Mood and Affect: Mood normal.        Behavior: Behavior normal. Behavior is cooperative.      Pertinent Labs, Studies, and Procedures:  CBC Latest Ref Rng & Units 01/09/2021 01/08/2021 01/07/2021  WBC 4.0 - 10.5 K/uL 11.0(H) 11.6(H) 11.3(H)  Hemoglobin 12.0 - 15.0 g/dL 9.2(L) 8.8(L) 9.2(L)  Hematocrit 36.0 - 46.0 % 29.7(L) 29.1(L) 29.1(L)   Platelets 150 - 400 K/uL 424(H) 396 411(H)   INR/Prothrombin Time: 1.1/14.4  Discharge Instructions: Discharge Instructions     Call MD for:  difficulty breathing, headache or visual disturbances   Complete by: As directed    Call MD for:  extreme fatigue   Complete by: As directed    Call MD for:  hives   Complete by: As directed    Call MD for:  persistant dizziness or light-headedness   Complete by: As directed    Call MD for:  persistant nausea and vomiting   Complete by: As directed    Call MD for:  redness, tenderness, or signs of infection (pain, swelling, redness, odor or green/yellow discharge around incision site)   Complete by: As directed    Call MD for:  severe uncontrolled pain   Complete by: As directed    Call MD for:  temperature >100.4   Complete by: As directed    Diet - low sodium heart healthy   Complete by: As directed    Discharge instructions   Complete by: As directed    Take your one full warfarin tablet Monday, Wednesday, and Friday.  Take half a tab of your warfarin tablet Saturday, Sunday, Tuesday, and Thursday.  Take all other medications as prescribed.  You have a follow-up appointment with orthopedic surgery next week. You have a follow-up appointment with your PCP on Monday. You have a follow-up appointment with your dentist next week. You have a follow-up appointment with your cardiologist September 16 at 9:30 AM, you are on the wait list for a earlier appointment if possible. They will call you if a sooner appointment becomes available. Continue INR checks once a week at the Coumadin clinic in [place].   Increase activity slowly   Complete by: As directed    Leave dressing on - Keep it clean, dry, and intact until clinic visit   Complete by: As directed        Signed: Timothy Lasso, MD 01/09/2021, 2:56 PM   Pager:[phone]

## 2021-01-09 NOTE — Progress Notes (Signed)
Physical Therapy Treatment Patient Details Name: [name] MRN: [id number] DOB: [birth year] Today's Date: 01/09/2021    History of Present Illness 60yo female admitted 12/30/20 with complaints of malaise and L hip pain. Of note, she received a maxillary bone graft on 12/26/20 and the graft donor site was her L iliac crest. Found to be in hypovolemic shock with multiple intra-abdominal hematomas. PMH A-flutter, A-fib, Bell's palsy, CAO, ILD, CHF, gout, HTN, HLD, lymphedema, obesity, valvular heart disease s/p MVR/AVR.    PT Comments    Pt received seated EOB, requesting assist to transfer to/from [facility] (RN/NT assisting other patients/unavailable at this time). Pt with good tolerance for transfer training and progressed to Supervision without AD for stand pivot to/from [facility] but does need assist to complete posterior peri-care and RW support for this portion due to need for anterior lean. Pt continues to benefit from PT services to progress toward functional mobility goals.    Follow Up Recommendations  Supervision for mobility/OOB;Home health PT     Equipment Recommendations  Other (comment) (bariatric rollator, toilet riser, bariatric tub transfer bench)    Recommendations for Other Services       Precautions / Restrictions Precautions Precautions: Fall;Other (comment) Precaution Comments: obesity, L hip bone graft site, watch sats; R LE lymphedema Restrictions Weight Bearing Restrictions: No    Mobility  Bed Mobility Overal bed mobility: Modified Independent             General bed mobility comments: pt received seated EOB    Transfers Overall transfer level: Needs assistance Equipment used: None Transfers: Sit to/from [facility] Sit to Stand: Supervision Stand pivot transfers: Supervision       General transfer comment: from EOB<>BSC  Ambulation/Gait Ambulation/Gait assistance: Supervision Gait Distance (Feet): 50 Feet Assistive device: Rolling  walker (2 wheeled) Gait Pattern/deviations: Step-through pattern;Decreased stride length Gait velocity: decreased   General Gait Details: pt defer due to fatigue after recent walk and frequent transfers to/from [facility].      Balance Overall balance assessment: Needs assistance Sitting-balance support: Feet supported;No upper extremity supported Sitting balance-Leahy Scale: Good     Standing balance support: No upper extremity supported Standing balance-Leahy Scale: Fair Standing balance comment: pt able to pivot to BSC from EOB with support from rails and utilized RW support when bending for peri-care, able to perform anterior peri-care but needs assist for posterior peri-care completion 2/2 body habitus                Cognition Arousal/Alertness: Awake/alert Behavior During Therapy: WFL for tasks assessed/performed Overall Cognitive Status: Within Functional Limits for tasks assessed             General Comments: jovial and pleasant      Exercises      General Comments General comments (skin integrity, edema, etc.): VSS on RA, RN notified pt with frequent loose stools      Pertinent Vitals/Pain Pain Assessment: Faces Faces Pain Scale: Hurts a little bit Pain Location: L hip at graft site Pain Descriptors / Indicators: Discomfort;Grimacing Pain Intervention(s): Limited activity within patient's tolerance;Monitored during session;Repositioned     PT Goals (current goals can now be found in the care plan section) Acute Rehab PT Goals Patient Stated Goal: to get back to normal PT Goal Formulation: With patient Time For Goal Achievement: 01/17/21 Progress towards PT goals: Progressing toward goals    Frequency    Min 3X/week      PT Plan Current plan remains appropriate  AM-PAC PT "6 Clicks" Mobility   Outcome Measure  Help needed turning from your back to your side while in a flat bed without using bedrails?: A Little Help needed moving from  lying on your back to sitting on the side of a flat bed without using bedrails?: A Little Help needed moving to and from a bed to a chair (including a wheelchair)?: A Little Help needed standing up from a chair using your arms (e.g., wheelchair or bedside chair)?: A Little Help needed to walk in hospital room?: A Little Help needed climbing 3-5 steps with a railing? : A Lot 6 Click Score: 17    End of Session Equipment Utilized During Treatment: Gait belt Activity Tolerance: Patient tolerated treatment well Patient left: with call bell/phone within reach;in bed (per RN pt OK without alarm as she is now Supervision level and plan to DC home) Nurse Communication: Mobility status PT Visit Diagnosis: Unsteadiness on feet (R26.81);Muscle weakness (generalized) (M62.81);Difficulty in walking, not elsewhere classified (R26.2);Pain Pain - Right/Left: Left Pain - part of body: Hip     Time: 3664-4034 PT Time Calculation (min) (ACUTE ONLY): 9 min  Charges:  $Gait Training: 8-22 mins $Therapeutic Activity: 8-22 mins                     Nahshon Reich P., PTA Acute Rehabilitation Services Pager: [phone] Office: [place] 01/09/2021, 5:19 PM
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
## 2021-01-09 NOTE — Telephone Encounter (Signed)
Pt denies needing to speak with a nurse. She knows she has been placed on wait list for [name] as a post hosp follow up.  No questions for me at this time.

## 2021-01-09 NOTE — Telephone Encounter (Signed)
Provider from [facility] called wanting to know if the mesh could be removed today for patient to be discharged?  CB# [phone].  Please advise.  Thank you.

## 2021-01-09 NOTE — Progress Notes (Addendum)
[name] PROGRESS NOTE:  [name] is a 60 y.o.  who underwent maxillary bone graft from left iliac crest graft harvest on 12/26/2020 for dental reconstruction. Discharger 12/27/2020 to home in stable condition.  Admitted 12/30/2020 for hypovolemic shock and intra-abdominal wall hematomas.presented with left hip pain and bruising of her face and abdomen s/p   PMHx significant for valvular heart disease s/p MVR/AVR (2016) was on Coumadin, paroxysmal atrial fibrillation, HFpEF, idiopathic pulmonary hemosiderosis, ILD, chronic hypoxic respiratory failure on home oxygen PRN only, CKD Stage 3. Morbid obesity.  SUBJECTIVE: Feeling better, want to go home.  OBJECTIVE:  Vitals: Blood pressure 109/61, pulse 69, temperature 98.3 F (36.8 C), temperature source Oral, resp. rate 17, height 5' 6.25" (1.683 m), weight 132.5 kg, SpO2 97 %. Lab results:No results found for this or any previous visit (from the past 73 hour(s)). Radiology Results: No results found. General appearance: alert, cooperative, no distress, and morbidly obese Head: Normocephalic, without obvious abnormality, atraumatic Eyes: negative Nose: Nares normal. Septum midline. Mucosa normal. No drainage or sinus tenderness. Throat: Oral cavity hemostatic, sutures intact. Mild edema. No fluctuance or trismus. Pharynx clear. Neck: no adenopathy and Bilateral submandibular ecchymosis. No induration or fluctuance.   ASSESSMENT: 60 yo female admitted for hypovolemic shock s/p maxillary bone graft from iliac crest. Patient stabilized, pending discharge to home for gradual [place] with coumadin.  PLAN: Will see in office next week for suture removal. Continue soft diet, Peridex oral rinse.    Diona Browner 01/09/2021

## 2021-01-09 NOTE — Progress Notes (Signed)
Physical Therapy Treatment Patient Details Name: [name] MRN: [id number] DOB: 09/23/1960 Today's Date: 01/09/2021    History of Present Illness 60yo female admitted 12/30/20 with complaints of malaise and L hip pain. Of note, she received a maxillary bone graft on 12/26/20 and the graft donor site was her L iliac crest. Found to be in hypovolemic shock with multiple intra-abdominal hematomas. PMH A-flutter, A-fib, Bell's palsy, CAO, ILD, CHF, gout, HTN, HLD, lymphedema, obesity, valvular heart disease s/p MVR/AVR.    PT Comments    Pt received in supine, agreeable to therapy session and with good participation, gait tolerance limited due to urgency for BM so deferred longer hallway ambulation this date. DME recommendations updated per discussion with pt and supervising PT Mariane Baumgarten, case manager notified. Pt continues to benefit from PT services to progress toward functional mobility goals.    Follow Up Recommendations  Supervision for mobility/OOB;Home health PT     Equipment Recommendations  Other (comment) (bariatric rollator, toilet riser, bariatric tub transfer bench)    Recommendations for Other Services       Precautions / Restrictions Precautions Precautions: Fall;Other (comment) Precaution Comments: obesity, L hip bone graft site, watch sats; R LE lymphedema Restrictions Weight Bearing Restrictions: No    Mobility  Bed Mobility Overal bed mobility: Modified Independent             General bed mobility comments: no physical assist needed, use of rail    Transfers Overall transfer level: Needs assistance Equipment used: Rolling walker (2 wheeled) Transfers: Sit to/from [employer] Transfers Sit to Stand: Supervision Stand pivot transfers: Supervision       General transfer comment: from EOB<>BSC  Ambulation/Gait Ambulation/Gait assistance: Supervision Gait Distance (Feet): 50 Feet Assistive device: Rolling walker (2 wheeled) Gait  Pattern/deviations: Step-through pattern;Decreased stride length Gait velocity: decreased   General Gait Details: safety cues for posture/proximity to RW with fair carryover.   Stairs             Wheelchair Mobility    Modified Rankin (Stroke Patients Only)       Balance Overall balance assessment: Needs assistance Sitting-balance support: Feet supported;No upper extremity supported Sitting balance-Leahy Scale: Good     Standing balance support: No upper extremity supported Standing balance-Leahy Scale: Fair Standing balance comment: able to stand and don mask briefly but needing UE support for dynamic standing tasks                            Cognition Arousal/Alertness: Awake/alert Behavior During Therapy: WFL for tasks assessed/performed Overall Cognitive Status: Within Functional Limits for tasks assessed                                 General Comments: jovial and pleasant      Exercises      General Comments        Pertinent Vitals/Pain Pain Assessment: Faces Faces Pain Scale: Hurts a little bit Pain Location: L hip at graft site Pain Descriptors / Indicators: Aching;Discomfort Pain Intervention(s): Monitored during session;Repositioned    Home Living                      Prior Function            PT Goals (current goals can now be found in the care plan section) Acute Rehab PT Goals Patient Stated Goal:  to get back to normal PT Goal Formulation: With patient Time For Goal Achievement: 01/17/21 Progress towards PT goals: Progressing toward goals    Frequency    Min 3X/week      PT Plan Current plan remains appropriate;Equipment recommendations need to be updated    Co-evaluation              AM-PAC PT "6 Clicks" Mobility   Outcome Measure  Help needed turning from your back to your side while in a flat bed without using bedrails?: A Little Help needed moving from lying on your back to  sitting on the side of a flat bed without using bedrails?: A Little Help needed moving to and from a bed to a chair (including a wheelchair)?: A Little Help needed standing up from a chair using your arms (e.g., wheelchair or bedside chair)?: A Little Help needed to walk in hospital room?: A Little Help needed climbing 3-5 steps with a railing? : A Lot 6 Click Score: 17    End of Session Equipment Utilized During Treatment: Gait belt Activity Tolerance: Patient tolerated treatment well Patient left: with call bell/phone within reach;in bed (per RN pt OK without alarm as she is now Supervision level and plan to DC home) Nurse Communication: Mobility status PT Visit Diagnosis: Unsteadiness on feet (R26.81);Muscle weakness (generalized) (M62.81);Difficulty in walking, not elsewhere classified (R26.2);Pain Pain - Right/Left: Left Pain - part of body: Hip     Time: 2263-3354 PT Time Calculation (min) (ACUTE ONLY): 17 min  Charges:  $Gait Training: 8-22 mins                     Nahun Kronberg P., PTA Acute Rehabilitation Services Pager: [phone] Office: [place] 01/09/2021, 4:13 PM
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
## 2021-01-12 DIAGNOSIS — N183 Chronic kidney disease, stage 3 unspecified: Secondary | ICD-10-CM | POA: Diagnosis not present

## 2021-01-12 DIAGNOSIS — Z09 Encounter for follow-up examination after completed treatment for conditions other than malignant neoplasm: Secondary | ICD-10-CM | POA: Diagnosis not present

## 2021-01-12 DIAGNOSIS — Z9229 Personal history of other drug therapy: Secondary | ICD-10-CM | POA: Diagnosis not present

## 2021-01-12 DIAGNOSIS — S301XXS Contusion of abdominal wall, sequela: Secondary | ICD-10-CM | POA: Diagnosis not present

## 2021-01-12 DIAGNOSIS — Z952 Presence of prosthetic heart valve: Secondary | ICD-10-CM | POA: Diagnosis not present

## 2021-01-12 DIAGNOSIS — I10 Essential (primary) hypertension: Secondary | ICD-10-CM | POA: Diagnosis not present

## 2021-01-12 DIAGNOSIS — R571 Hypovolemic shock: Secondary | ICD-10-CM | POA: Diagnosis not present

## 2021-01-13 ENCOUNTER — Other Ambulatory Visit: Payer: Self-pay

## 2021-01-13 ENCOUNTER — Ambulatory Visit (INDEPENDENT_AMBULATORY_CARE_PROVIDER_SITE_OTHER): Payer: Medicare Other

## 2021-01-13 DIAGNOSIS — Z5181 Encounter for therapeutic drug level monitoring: Secondary | ICD-10-CM

## 2021-01-13 DIAGNOSIS — I480 Paroxysmal atrial fibrillation: Secondary | ICD-10-CM | POA: Diagnosis not present

## 2021-01-13 LAB — POCT INR: INR: 1.4 — AB (ref 2.0–3.0)

## 2021-01-13 NOTE — Patient Instructions (Signed)
Description   Take 1 tablet tonight and 1.5 tablets tomorrow and then continue taking 1/2  tablet every day except 1 tablet on Monday, Wednesday and Friday.   Recheck INR in 1 weeks. Coumadin Clinic # [phone].

## 2021-01-14 ENCOUNTER — Encounter: Payer: Self-pay | Admitting: Physician Assistant

## 2021-01-14 ENCOUNTER — Ambulatory Visit (INDEPENDENT_AMBULATORY_CARE_PROVIDER_SITE_OTHER): Payer: Medicare Other | Admitting: Physician Assistant

## 2021-01-14 VITALS — BP 123/72 | HR 67 | Temp 100.3°F

## 2021-01-14 DIAGNOSIS — S7002XD Contusion of left hip, subsequent encounter: Secondary | ICD-10-CM

## 2021-01-14 NOTE — Progress Notes (Signed)
HPI: Mrs. Voyles is a pleasant 60 year old female comes in today due to being concerned over left anterior iliac crest bone graft incision site possible infection.  She underwent a type of left anterior iliac crest bone graft harvest for oral surgery.  This was performed by Dr. Inda Merlin on 12/26/2020.  She comes in today with her sister due to concerns about possible infection at the surgical site over the left iliac crest.  She denies any fevers or chills.  She did develop significant hematoma over this area.  She denies any drainage.  Physical exam: Left hip iliac crest surgical incision site is well approximated staples there is no active bleeding.  No signs of infection no drainage no expressible purulence.  Attempted aspiration reveals dark blood consistent with hematoma.  Impression: Status post left anterior iliac crest bone graft harvest 12/26/2020  Plan: Recommend heat to the area and that she is to be careful not to burn the area this can be done using a heating pad with a towel between the incision and heating pad.  She can get the incision wet and wash it with soap.  She will cover it with dressings and Mepilex dressings are given this to be covered when she has completely dried the area.  She will follow-up with Dr. Lorin Mercy as scheduled sooner if there is any questions concerns.  Sutures were not removed today.  Questions encouraged and answered by Dr. Ninfa Linden and myself.

## 2021-01-19 ENCOUNTER — Encounter: Payer: Self-pay | Admitting: Orthopaedic Surgery

## 2021-01-19 ENCOUNTER — Other Ambulatory Visit: Payer: Self-pay

## 2021-01-19 ENCOUNTER — Ambulatory Visit (INDEPENDENT_AMBULATORY_CARE_PROVIDER_SITE_OTHER): Payer: Medicare Other | Admitting: Orthopaedic Surgery

## 2021-01-19 DIAGNOSIS — M96840 Postprocedural hematoma of a musculoskeletal structure following a musculoskeletal system procedure: Secondary | ICD-10-CM
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
## 2021-01-20 ENCOUNTER — Encounter (HOSPITAL_COMMUNITY): Payer: Self-pay | Admitting: Orthopaedic Surgery

## 2021-01-20 ENCOUNTER — Other Ambulatory Visit: Payer: Self-pay

## 2021-01-20 ENCOUNTER — Ambulatory Visit (INDEPENDENT_AMBULATORY_CARE_PROVIDER_SITE_OTHER): Payer: Medicare Other

## 2021-01-20 DIAGNOSIS — Z5181 Encounter for therapeutic drug level monitoring: Secondary | ICD-10-CM | POA: Diagnosis not present

## 2021-01-20 DIAGNOSIS — I480 Paroxysmal atrial fibrillation: Secondary | ICD-10-CM

## 2021-01-20 LAB — POCT INR: INR: 3.8 — AB (ref 2.0–3.0)

## 2021-01-20 NOTE — Progress Notes (Signed)
Spoke with pt. She said INR lab told her not to take her coumadin tonight.

## 2021-01-20 NOTE — Patient Instructions (Signed)
Description   Hold Warfarin today and then continue taking 1/2  tablet every day except 1 tablet on Monday, Wednesday and Friday.   Recheck INR in 1 week. [facility] # [phone].
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
## 2021-01-21 ENCOUNTER — Encounter (HOSPITAL_COMMUNITY)
Admission: RE | Disposition: A | Discharge status: Home / Self Care | Payer: Self-pay | Source: Home / Self Care | Attending: Orthopaedic Surgery

## 2021-01-21 ENCOUNTER — Encounter (HOSPITAL_COMMUNITY): Payer: Self-pay | Admitting: Orthopaedic Surgery

## 2021-01-21 ENCOUNTER — Ambulatory Visit (HOSPITAL_COMMUNITY): Payer: Medicare Other | Admitting: Physician Assistant

## 2021-01-21 ENCOUNTER — Ambulatory Visit (HOSPITAL_COMMUNITY)
Admission: RE | Admit: 2021-01-21 | Discharge: 2021-01-21 | Disposition: A | Discharge status: Home / Self Care | Payer: Medicare Other | Attending: Orthopaedic Surgery | Admitting: Orthopaedic Surgery

## 2021-01-21 DIAGNOSIS — Z87891 Personal history of nicotine dependence: Secondary | ICD-10-CM | POA: Insufficient documentation

## 2021-01-21 DIAGNOSIS — Z6841 Body Mass Index (BMI) 40.0 and over, adult: Secondary | ICD-10-CM | POA: Diagnosis not present

## 2021-01-21 DIAGNOSIS — Z7901 Long term (current) use of anticoagulants: Secondary | ICD-10-CM | POA: Diagnosis not present

## 2021-01-21 DIAGNOSIS — Z833 Family history of diabetes mellitus: Secondary | ICD-10-CM | POA: Insufficient documentation

## 2021-01-21 DIAGNOSIS — I480 Paroxysmal atrial fibrillation: Secondary | ICD-10-CM | POA: Diagnosis not present

## 2021-01-21 DIAGNOSIS — Z952 Presence of prosthetic heart valve: Secondary | ICD-10-CM | POA: Diagnosis not present

## 2021-01-21 DIAGNOSIS — Z825 Family history of asthma and other chronic lower respiratory diseases: Secondary | ICD-10-CM | POA: Insufficient documentation

## 2021-01-21 DIAGNOSIS — Z841 Family history of disorders of kidney and ureter: Secondary | ICD-10-CM | POA: Insufficient documentation

## 2021-01-21 DIAGNOSIS — N183 Chronic kidney disease, stage 3 unspecified: Secondary | ICD-10-CM | POA: Insufficient documentation

## 2021-01-21 DIAGNOSIS — I5032 Chronic diastolic (congestive) heart failure: Secondary | ICD-10-CM | POA: Diagnosis not present

## 2021-01-21 DIAGNOSIS — L7632 Postprocedural hematoma of skin and subcutaneous tissue following other procedure: Secondary | ICD-10-CM | POA: Diagnosis present

## 2021-01-21 DIAGNOSIS — Z8249 Family history of ischemic heart disease and other diseases of the circulatory system: Secondary | ICD-10-CM | POA: Diagnosis not present

## 2021-01-21 DIAGNOSIS — Z9884 Bariatric surgery status: Secondary | ICD-10-CM | POA: Insufficient documentation

## 2021-01-21 DIAGNOSIS — M96840 Postprocedural hematoma of a musculoskeletal structure following a musculoskeletal system procedure: Secondary | ICD-10-CM | POA: Diagnosis not present

## 2021-01-21 DIAGNOSIS — S301XXA Contusion of abdominal wall, initial encounter: Secondary | ICD-10-CM | POA: Diagnosis not present

## 2021-01-21 DIAGNOSIS — M96841 Postprocedural hematoma of a musculoskeletal structure following other procedure: Secondary | ICD-10-CM

## 2021-01-21 DIAGNOSIS — Z823 Family history of stroke: Secondary | ICD-10-CM | POA: Insufficient documentation

## 2021-01-21 DIAGNOSIS — E785 Hyperlipidemia, unspecified: Secondary | ICD-10-CM | POA: Diagnosis not present

## 2021-01-21 DIAGNOSIS — G4733 Obstructive sleep apnea (adult) (pediatric): Secondary | ICD-10-CM | POA: Diagnosis not present

## 2021-01-21 DIAGNOSIS — I130 Hypertensive heart and chronic kidney disease with heart failure and stage 1 through stage 4 chronic kidney disease, or unspecified chronic kidney disease: Secondary | ICD-10-CM | POA: Diagnosis not present

## 2021-01-21 LAB — COMPREHENSIVE METABOLIC PANEL
ALT: 8 U/L (ref 0–44)
AST: 18 U/L (ref 15–41)
Albumin: 3.6 g/dL (ref 3.5–5.0)
Alkaline Phosphatase: 62 U/L (ref 38–126)
Anion gap: 7 (ref 5–15)
BUN: 17 mg/dL (ref 8–23)
CO2: 23 mmol/L (ref 22–32)
Calcium: 9.6 mg/dL (ref 8.9–10.3)
Chloride: 110 mmol/L (ref 98–111)
Creatinine, Ser: 1.21 mg/dL — ABNORMAL HIGH (ref 0.44–1.00)
GFR, Estimated: 51 mL/min — ABNORMAL LOW (ref >60)
Glucose, Bld: 84 mg/dL (ref 70–99)
Potassium: 4.2 mmol/L (ref 3.5–5.1)
Sodium: 140 mmol/L (ref 135–145)
Total Bilirubin: 1.4 mg/dL — ABNORMAL HIGH (ref 0.3–1.2)
Total Protein: 7.1 g/dL (ref 6.5–8.1)

## 2021-01-21 LAB — CBC
HCT: 34.1 % — ABNORMAL LOW (ref 36.0–46.0)
Hemoglobin: 10.4 g/dL — ABNORMAL LOW (ref 12.0–15.0)
MCH: 30.9 pg (ref 26.0–34.0)
MCHC: 30.5 g/dL (ref 30.0–36.0)
MCV: 101.2 fL — ABNORMAL HIGH (ref 80.0–100.0)
Platelets: 250 10*3/uL (ref 150–400)
RBC: 3.37 MIL/uL — ABNORMAL LOW (ref 3.87–5.11)
RDW: 18.2 % — ABNORMAL HIGH (ref 11.5–15.5)
WBC: 4.9 10*3/uL (ref 4.0–10.5)
nRBC: 0 % (ref 0.0–0.2)

## 2021-01-21 LAB — SURGICAL PCR SCREEN
MRSA, PCR: NEGATIVE
Staphylococcus aureus: NEGATIVE

## 2021-01-21 LAB — PROTIME-INR
INR: 2.5 — ABNORMAL HIGH (ref 0.8–1.2)
Prothrombin Time: 26.7 seconds — ABNORMAL HIGH (ref 11.4–15.2)

## 2021-01-21 SURGERY — IRRIGATION AND DEBRIDEMENT ANTERIOR HIP
Anesthesia: General | Site: Abdomen | Laterality: Left

## 2021-01-21 MED ORDER — PROPOFOL 10 MG/ML IV BOLUS
INTRAVENOUS | Status: DC | PRN
  Administered 2021-01-21: 100 mg via INTRAVENOUS

## 2021-01-21 MED ORDER — OXYCODONE HCL 5 MG PO TABS: 5.0000 mg | ORAL_TABLET | Freq: Once | ORAL | Status: DC | PRN

## 2021-01-21 MED ORDER — FENTANYL CITRATE (PF) 100 MCG/2ML IJ SOLN
INTRAMUSCULAR | Status: DC | PRN
  Administered 2021-01-21: 100 ug via INTRAVENOUS

## 2021-01-21 MED ORDER — FENTANYL CITRATE (PF) 250 MCG/5ML IJ SOLN
INTRAMUSCULAR | Status: AC
  Filled 2021-01-21: qty 5

## 2021-01-21 MED ORDER — ONDANSETRON HCL 4 MG/2ML IJ SOLN
INTRAMUSCULAR | Status: AC
  Filled 2021-01-21: qty 2

## 2021-01-21 MED ORDER — OXYCODONE HCL 5 MG/5ML PO SOLN: 5.0000 mg | Freq: Once | ORAL | Status: DC | PRN

## 2021-01-21 MED ORDER — CEFAZOLIN SODIUM 1 G IJ SOLR
INTRAMUSCULAR | Status: AC
  Filled 2021-01-21: qty 10

## 2021-01-21 MED ORDER — CEFAZOLIN IN SODIUM CHLORIDE 3-0.9 GM/100ML-% IV SOLN
3.0000 g | INTRAVENOUS | Status: AC
  Administered 2021-01-21: 3 g via INTRAVENOUS
  Filled 2021-01-21: qty 100

## 2021-01-21 MED ORDER — PHENYLEPHRINE HCL (PRESSORS) 10 MG/ML IV SOLN
INTRAVENOUS | Status: DC | PRN
  Administered 2021-01-21 (×2): 100 ug via INTRAVENOUS

## 2021-01-21 MED ORDER — HYDROCODONE-ACETAMINOPHEN 7.5-325 MG PO TABS: 1.0000 | ORAL_TABLET | Freq: Four times a day (QID) | ORAL | 0 refills | Status: DC | PRN

## 2021-01-21 MED ORDER — FENTANYL CITRATE (PF) 100 MCG/2ML IJ SOLN: 25.0000 ug | INTRAMUSCULAR | Status: DC | PRN

## 2021-01-21 MED ORDER — ONDANSETRON HCL 4 MG/2ML IJ SOLN
INTRAMUSCULAR | Status: DC | PRN
  Administered 2021-01-21: 4 mg via INTRAVENOUS

## 2021-01-21 MED ORDER — ORAL CARE MOUTH RINSE: 15.0000 mL | Freq: Once | OROMUCOSAL | Status: AC

## 2021-01-21 MED ORDER — PROPOFOL 10 MG/ML IV BOLUS
INTRAVENOUS | Status: AC
  Filled 2021-01-21: qty 20

## 2021-01-21 MED ORDER — MIDAZOLAM HCL 5 MG/5ML IJ SOLN
INTRAMUSCULAR | Status: DC | PRN
  Administered 2021-01-21: 2 mg via INTRAVENOUS

## 2021-01-21 MED ORDER — AMISULPRIDE (ANTIEMETIC) 5 MG/2ML IV SOLN: 10.0000 mg | Freq: Once | INTRAVENOUS | Status: DC | PRN

## 2021-01-21 MED ORDER — CHLORHEXIDINE GLUCONATE 0.12 % MT SOLN
15.0000 mL | Freq: Once | OROMUCOSAL | Status: AC
  Administered 2021-01-21: 15 mL via OROMUCOSAL
  Filled 2021-01-21: qty 15

## 2021-01-21 MED ORDER — ONDANSETRON HCL 4 MG/2ML IJ SOLN: 4.0000 mg | Freq: Once | INTRAMUSCULAR | Status: DC | PRN

## 2021-01-21 MED ORDER — LIDOCAINE HCL (CARDIAC) PF 100 MG/5ML IV SOSY
PREFILLED_SYRINGE | INTRAVENOUS | Status: DC | PRN
  Administered 2021-01-21: 50 mg via INTRAVENOUS

## 2021-01-21 MED ORDER — PHENYLEPHRINE 40 MCG/ML (10ML) SYRINGE FOR IV PUSH (FOR BLOOD PRESSURE SUPPORT)
PREFILLED_SYRINGE | INTRAVENOUS | Status: AC
  Filled 2021-01-21: qty 10

## 2021-01-21 MED ORDER — LACTATED RINGERS IV SOLN: INTRAVENOUS | Status: DC

## 2021-01-21 MED ORDER — MIDAZOLAM HCL 2 MG/2ML IJ SOLN
INTRAMUSCULAR | Status: AC
  Filled 2021-01-21: qty 2

## 2021-01-21 SURGICAL SUPPLY — 37 items
BAG COUNTER SPONGE SURGICOUNT (BAG) ×2 IMPLANT
BAG SURGICOUNT SPONGE COUNTING (BAG) ×1
COVER MAYO STAND STRL (DRAPES) ×3 IMPLANT
COVER SURGICAL LIGHT HANDLE (MISCELLANEOUS) ×3 IMPLANT
DRAPE IMP U-DRAPE 54X76 (DRAPES) ×3 IMPLANT
DRAPE INCISE IOBAN 66X45 STRL (DRAPES) ×6 IMPLANT
DRAPE U-SHAPE 47X51 STRL (DRAPES) ×3 IMPLANT
DRESSING PEEL AND PLC PRVNA 13 (GAUZE/BANDAGES/DRESSINGS) ×1 IMPLANT
DRSG PEEL AND PLACE PREVENA 13 (GAUZE/BANDAGES/DRESSINGS) ×3
DURAPREP 26ML APPLICATOR (WOUND CARE) ×3 IMPLANT
ELECT BLADE 4.0 EZ CLEAN MEGAD (MISCELLANEOUS) ×3
ELECT CAUTERY BLADE 6.4 (BLADE) ×3 IMPLANT
ELECT REM PT RETURN 9FT ADLT (ELECTROSURGICAL) ×3
ELECTRODE BLDE 4.0 EZ CLN MEGD (MISCELLANEOUS) ×1 IMPLANT
ELECTRODE REM PT RTRN 9FT ADLT (ELECTROSURGICAL) ×1 IMPLANT
GLOVE SRG 8 PF TXTR STRL LF DI (GLOVE) ×2 IMPLANT
GLOVE SURG ORTHO LTX SZ7.5 (GLOVE) ×6 IMPLANT
GLOVE SURG UNDER POLY LF SZ8 (GLOVE) ×6
GOWN STRL REUS W/ TWL LRG LVL3 (GOWN DISPOSABLE) ×1 IMPLANT
GOWN STRL REUS W/ TWL XL LVL3 (GOWN DISPOSABLE) ×1 IMPLANT
GOWN STRL REUS W/TWL 2XL LVL3 (GOWN DISPOSABLE) ×3 IMPLANT
GOWN STRL REUS W/TWL LRG LVL3 (GOWN DISPOSABLE) ×3
GOWN STRL REUS W/TWL XL LVL3 (GOWN DISPOSABLE) ×3
KIT BASIN OR (CUSTOM PROCEDURE TRAY) ×3 IMPLANT
KIT DRSG PREVENA PLUS 7DAY 125 (MISCELLANEOUS) ×3 IMPLANT
KIT TURNOVER KIT B (KITS) ×3 IMPLANT
MANIFOLD NEPTUNE II (INSTRUMENTS) ×3 IMPLANT
NS IRRIG 1000ML POUR BTL (IV SOLUTION) ×3 IMPLANT
PAD ARMBOARD 7.5X6 YLW CONV (MISCELLANEOUS) ×3 IMPLANT
SUT ETHILON 2 0 FS 18 (SUTURE) ×6 IMPLANT
SUT VIC AB 2-0 CT1 27 (SUTURE) ×3
SUT VIC AB 2-0 CT1 TAPERPNT 27 (SUTURE) ×1 IMPLANT
TOWEL GREEN STERILE FF (TOWEL DISPOSABLE) ×3 IMPLANT
TUBE CONNECTING 12'X1/4 (SUCTIONS) ×1
TUBE CONNECTING 12X1/4 (SUCTIONS) ×2 IMPLANT
WATER STERILE IRR 1000ML POUR (IV SOLUTION) ×3 IMPLANT
YANKAUER SUCT BULB TIP NO VENT (SUCTIONS) ×3 IMPLANT

## 2021-01-21 NOTE — Op Note (Signed)
Pre and postop diagnosis: Left anterior iliac crest bone graft postop hematoma.  Procedure: Reexploration evacuation of postop hematoma.  Incisional VAC placement.  Surgeon: Lorin Mercy MD  Assistant: Benjiman Core, PA-C present for the entire procedure.  Anesthesia LMA, general.  EBL less than 100 cc.  Large amount of clots removed.  Brief history: Patient on 12/26/2020 had surgery by Dr. Hoyt Koch oral surgery for extractions and dental restoration needing iliac crest bone for bone grafting so the patient will be able to use dentures later.  Cortical cancellous strips and cancellous bone was harvested for him.  Patient has history of atrial flutter previous cardiac valve with a AVR with St. Jude's and MVR also St. Jude's 2016.  Due to valve replacement she has been on chronic Coumadin.  Postoperatively she was restarted on her Coumadin and had occurrence of significant hematoma had to be readmitted and had transfusions of believed total of 6 units packed cells.  She also received some FFP and also vitamin K.  Patient went home her sutures have remained in place and over the last several days she developed increasing bloody drainage from the anterior portion of the incision managed with dressing changes.  No purulence no fever no chills.  Procedure: After induction of general anesthesia LMA tube placement 1015 drapes were then applied DuraPrep was used Ancef was given prophylactically timeout procedure completed.  Anterior 3 sutures were removed.  Subcu was placed and large amount of clots were suctioned out using a tonsil sucker approximately the size of  3 fists.  Intermittently has sponges packed into the wound check to see if there is any areas of active bleeding and plan was to minimize exposure and just suck out the clot to decompress it since her INR had been 3.5 yesterday and was 2.5 today still on Coumadin.  Using the previous CT scan for direction the 2 large areas where there were flexion of clots from  previous hematoma subcutaneous was suctioned.  This does include an area over the lower abdomen and subcutaneous tissue cephalad to the iliac crest and the other lateral and inferior.  Large amount of clots were suctioned out.  Repeat irrigation repeat suctioning until no clots were left.  Sponges were placed there is minimal bleeding on sponge and then 2-0 nylon sutures were used to reclose the skin interrupted.  Tincture benzoin and a Prevena incisional VAC was applied suction down but there was no fluid obtained when he hooked it up to the wall suction and then when the Praveena was active and working again no fluid appeared in the tube but the sponge was decompressed and there was a low leak rate.  Edges were reinforced with strips of Ioban.  Extra pink tape was applied to the tubing for securement so would be pulled off and patient was transferred to cover him in stable condition.  Postop plan is for her to be discharged follow-up in the office in 5 to 7 days.  If the [place] quit working they can just remove it and apply ABD pads that they have been doing with tape.
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
## 2021-01-21 NOTE — Progress Notes (Signed)
Pt with lymphedema to BLE. Unable to obtain large enough TED hose as ordered. SCD's available at bedside.   Jacqlyn Larsen, RN

## 2021-01-21 NOTE — Anesthesia Preprocedure Evaluation (Signed)
Anesthesia Evaluation  Patient identified by MRN, date of birth, ID band Patient awake    Reviewed: Allergy & Precautions, NPO status , Patient's Chart, lab work & pertinent test results  Airway Mallampati: III  TM Distance: >3 FB Neck ROM: Full    Dental  (+) Edentulous Upper, Missing, Dental Advisory Given   Pulmonary neg pulmonary ROS, asthma , sleep apnea , COPD, former smoker,    Pulmonary exam normal        Cardiovascular hypertension, +CHF  negative cardio ROS Normal cardiovascular exam+ dysrhythmias   Anticoagulated for mechanical valves. On coumadin.  2021 echo:  1. Left ventricular ejection fraction, by estimation, is 55 to 60%. The  left ventricle has normal function. The left ventricle has no regional  wall motion abnormalities. Left ventricular diastolic function could not  be evaluated.  2. Right ventricular systolic function is normal. The right ventricular  size is mildly enlarged. There is mildly elevated pulmonary artery  systolic pressure. The estimated right ventricular systolic pressure is  61.9 mmHg.  3. Left atrial size was mildly dilated.  4. 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 Mean gradient 10.7 mmHG at  67 bpm. EOA 1.35 cm2 (0.58 cm/m2). P 1/2 93 msec. Peak E 2.3 m/s. Overall,  likely severe patient-prosthesis mismatch is present. . The mitral valve  has been repaired/replaced. No  evidence of mitral valve regurgitation. There is a 25 mm mechanical valve  present in the mitral position. Procedure Date: 08/06/2014.  5. 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 Vmax 3.1 m/s, mean gradient  20 mmHG, DI 0.48, EOA 1.67 cm2 (0.72 cm/m2). Suspect moderate  patient-prosthesis mismatch. Difficult to determine if there is  paravalvular leak as reported on prior  echocardiograms, TEE recommended to clarify. The aortic valve has been  repaired/replaced. Aortic valve regurgitation is not visualized.  There is  a 19 mm mechanical valve present in the aortic position. Procedure Date:  08/06/2014.  6. The inferior vena cava is normal in size with greater than 50%  respiratory variability, suggesting right atrial pressure of 3 mmHg.   Comparison(s): A prior study was performed on 01/19/2019. No significant  change from prior study. Prior images reviewed side by side. Gradients  across both prostheses are stable.    Neuro/Psych PSYCHIATRIC DISORDERS Anxiety Depression  Neuromuscular disease (Bell's palsy) negative neurological ROS  negative psych ROS   GI/Hepatic negative GI ROS, Neg liver ROS, hiatal hernia, GERD  ,Gastric weight loss surgery in 2019   Endo/Other  Morbid obesity  Renal/GU Renal InsufficiencyRenal diseasenegative Renal ROS  negative genitourinary   Musculoskeletal negative musculoskeletal ROS (+) Arthritis ,   Abdominal   Peds negative pediatric ROS (+)  Hematology  (+) anemia , INR 2.5   Anesthesia Other Findings   Reproductive/Obstetrics negative OB ROS                            Anesthesia Physical  Anesthesia Plan  ASA: 3  Anesthesia Plan: General   Post-op Pain Management:    Induction: Intravenous  PONV Risk Score and Plan: 3 and Treatment may vary due to age or medical condition, Midazolam, Ondansetron and Dexamethasone  Airway Management Planned: LMA  Additional Equipment: None  Intra-op Plan:   Post-operative Plan: Extubation in OR  Informed Consent: I have reviewed the patients History and Physical, chart, labs and discussed the procedure including the risks, benefits and alternatives for the proposed anesthesia with the patient or authorized  representative who has indicated his/her understanding and acceptance.     Dental advisory given  Plan Discussed with:   Anesthesia Plan Comments:         Anesthesia Quick Evaluation

## 2021-01-21 NOTE — Interval H&P Note (Signed)
History and Physical Interval Note:  01/21/2021 3:02 PM  [name]  has presented today for surgery, with the diagnosis of left iliac hematoma.  The various methods of treatment have been discussed with the patient and family. After consideration of risks, benefits and other options for treatment, the patient has consented to  Procedure(s): EVACUATION OF LEFT ANTERIOR ILIAC HEMATOMA, INCISIONAL VAC (Left) as a surgical intervention.  The patient's history has been reviewed, patient examined, no change in status, stable for surgery.  I have reviewed the patient's chart and labs.  Questions were answered to the patient's satisfaction.     Marybelle Killings

## 2021-01-21 NOTE — Anesthesia Procedure Notes (Signed)
Procedure Name: LMA Insertion Date/Time: 01/21/2021 3:38 PM Performed by: Jonna Munro, CRNA Pre-anesthesia Checklist: Patient identified, Patient being monitored, Emergency Drugs available, Timeout performed and Suction available Patient Re-evaluated:Patient Re-evaluated prior to induction Oxygen Delivery Method: Circle System Utilized Preoxygenation: Pre-oxygenation with 100% oxygen Induction Type: IV induction Ventilation: Mask ventilation without difficulty LMA: LMA inserted LMA Size: 4.0 Number of attempts: 1 Placement Confirmation: positive ETCO2 and breath sounds checked- equal and bilateral Dental Injury: Teeth and Oropharynx as per pre-operative assessment

## 2021-01-21 NOTE — Discharge Instructions (Addendum)
Do not remove Prevena wound VAC.  If you have any increase pain or swelling of your left hip he should contact our office immediately.   No driving.  Limited ambulation.  No strenuous activity.  Follow-up appointment scheduled with Dr. Lorin Mercy January 28, 2021 at 9:30 AM.

## 2021-01-22 DIAGNOSIS — Z7901 Long term (current) use of anticoagulants: Secondary | ICD-10-CM | POA: Diagnosis not present

## 2021-01-22 DIAGNOSIS — M96840 Postprocedural hematoma of a musculoskeletal structure following a musculoskeletal system procedure: Secondary | ICD-10-CM | POA: Diagnosis not present

## 2021-01-22 DIAGNOSIS — I5032 Chronic diastolic (congestive) heart failure: Secondary | ICD-10-CM | POA: Diagnosis not present

## 2021-01-22 DIAGNOSIS — N183 Chronic kidney disease, stage 3 unspecified: Secondary | ICD-10-CM | POA: Diagnosis not present

## 2021-01-22 DIAGNOSIS — I130 Hypertensive heart and chronic kidney disease with heart failure and stage 1 through stage 4 chronic kidney disease, or unspecified chronic kidney disease: Secondary | ICD-10-CM | POA: Diagnosis not present

## 2021-01-22 NOTE — Anesthesia Postprocedure Evaluation (Signed)
Anesthesia Post Note  Patient: [name]  Procedure(s) Performed: EVACUATION OF LEFT ANTERIOR ILIAC HEMATOMA, INCISIONAL VAC (Left: Abdomen)     Patient location during evaluation: PACU Anesthesia Type: General Level of consciousness: awake and alert Pain management: pain level controlled Vital Signs Assessment: post-procedure vital signs reviewed and stable Respiratory status: spontaneous breathing, nonlabored ventilation and respiratory function stable Cardiovascular status: blood pressure returned to baseline and stable Postop Assessment: no apparent nausea or vomiting Anesthetic complications: no   No notable events documented.  Last Vitals:  Vitals:   01/21/21 1630 01/21/21 1645  BP: 113/65 100/69  Pulse: (!) 50 63  Resp: (!) 24 14  Temp:  (!) 36.1 C  SpO2: 93% 96%    Last Pain:  Vitals:   01/21/21 1645  PainSc: 0-No pain                 Lidia Collum

## 2021-01-22 NOTE — Transfer of Care (Signed)
Immediate Anesthesia Transfer of Care Note  Patient: [name]  Procedure(s) Performed: EVACUATION OF LEFT ANTERIOR ILIAC HEMATOMA, INCISIONAL VAC (Left: Abdomen)  Patient Location: PACU  Anesthesia Type:General  Level of Consciousness: awake, alert , oriented and patient cooperative  Airway & Oxygen Therapy: Patient Spontanous Breathing and Patient connected to face mask oxygen  Post-op Assessment: Report given to RN, Post -op Vital signs reviewed and stable and Patient moving all extremities X 4  Post vital signs: Reviewed and stable  Last Vitals:  Vitals Value Taken Time  BP 100/69 01/21/21 1645  Temp 36.1 C 01/21/21 1645  Pulse 43 01/21/21 1646  Resp 12 01/21/21 1646  SpO2 96 % 01/21/21 1646  Vitals shown include unvalidated device data.  Last Pain:  Vitals:   01/21/21 1645  PainSc: 0-No pain         Complications: No notable events documented.

## 2021-01-25 ENCOUNTER — Emergency Department (HOSPITAL_COMMUNITY)
Admission: EM | Admit: 2021-01-25 | Discharge: 2021-01-26 | Disposition: A | Discharge status: Home / Self Care | Payer: Medicare Other | Attending: Emergency Medicine | Admitting: Emergency Medicine

## 2021-01-25 ENCOUNTER — Encounter (HOSPITAL_COMMUNITY): Payer: Self-pay

## 2021-01-25 ENCOUNTER — Other Ambulatory Visit: Payer: Self-pay

## 2021-01-25 DIAGNOSIS — Z79899 Other long term (current) drug therapy: Secondary | ICD-10-CM | POA: Insufficient documentation

## 2021-01-25 DIAGNOSIS — Z87891 Personal history of nicotine dependence: Secondary | ICD-10-CM | POA: Diagnosis not present

## 2021-01-25 DIAGNOSIS — N183 Chronic kidney disease, stage 3 unspecified: Secondary | ICD-10-CM | POA: Diagnosis not present

## 2021-01-25 DIAGNOSIS — Z4801 Encounter for change or removal of surgical wound dressing: Secondary | ICD-10-CM | POA: Diagnosis not present

## 2021-01-25 DIAGNOSIS — I130 Hypertensive heart and chronic kidney disease with heart failure and stage 1 through stage 4 chronic kidney disease, or unspecified chronic kidney disease: Secondary | ICD-10-CM | POA: Insufficient documentation

## 2021-01-25 DIAGNOSIS — I5032 Chronic diastolic (congestive) heart failure: Secondary | ICD-10-CM | POA: Diagnosis not present

## 2021-01-25 DIAGNOSIS — Z4689 Encounter for fitting and adjustment of other specified devices: Secondary | ICD-10-CM

## 2021-01-25 LAB — BASIC METABOLIC PANEL
Anion gap: 9 (ref 5–15)
BUN: 15 mg/dL (ref 8–23)
CO2: 20 mmol/L — ABNORMAL LOW (ref 22–32)
Calcium: 9.7 mg/dL (ref 8.9–10.3)
Chloride: 107 mmol/L (ref 98–111)
Creatinine, Ser: 1.27 mg/dL — ABNORMAL HIGH (ref 0.44–1.00)
GFR, Estimated: 48 mL/min — ABNORMAL LOW (ref >60)
Glucose, Bld: 97 mg/dL (ref 70–99)
Potassium: 4.6 mmol/L (ref 3.5–5.1)
Sodium: 136 mmol/L (ref 135–145)

## 2021-01-25 LAB — CBC WITH DIFFERENTIAL/PLATELET
Abs Immature Granulocytes: 0.01 10*3/uL (ref 0.00–0.07)
Basophils Absolute: 0 10*3/uL (ref 0.0–0.1)
Basophils Relative: 0 %
Eosinophils Absolute: 0.1 10*3/uL (ref 0.0–0.5)
Eosinophils Relative: 2 %
HCT: 38.2 % (ref 36.0–46.0)
Hemoglobin: 11.6 g/dL — ABNORMAL LOW (ref 12.0–15.0)
Immature Granulocytes: 0 %
Lymphocytes Relative: 8 %
Lymphs Abs: 0.4 10*3/uL — ABNORMAL LOW (ref 0.7–4.0)
MCH: 31.3 pg (ref 26.0–34.0)
MCHC: 30.4 g/dL (ref 30.0–36.0)
MCV: 103 fL — ABNORMAL HIGH (ref 80.0–100.0)
Monocytes Absolute: 0.9 10*3/uL (ref 0.1–1.0)
Monocytes Relative: 16 %
Neutro Abs: 4.2 10*3/uL (ref 1.7–7.7)
Neutrophils Relative %: 74 %
Platelets: 254 10*3/uL (ref 150–400)
RBC: 3.71 MIL/uL — ABNORMAL LOW (ref 3.87–5.11)
RDW: 18.1 % — ABNORMAL HIGH (ref 11.5–15.5)
WBC: 5.6 10*3/uL (ref 4.0–10.5)
nRBC: 0 % (ref 0.0–0.2)

## 2021-01-25 LAB — PROTIME-INR
INR: 2.5 — ABNORMAL HIGH (ref 0.8–1.2)
Prothrombin Time: 26.6 seconds — ABNORMAL HIGH (ref 11.4–15.2)

## 2021-01-25 NOTE — ED Triage Notes (Addendum)
Patient arrives for evaluation of wound vac attached to her left hip/left side of abdomen. Patient has surgery 4 days ago and she was not given additional wound vac canister replacements. Current wound canister full. Dressing site clean and intact.   -Pt does take Warfarin for two mechanical valves.

## 2021-01-25 NOTE — ED Provider Notes (Signed)
Emergency Medicine Provider Triage Evaluation Note  [name] , a 60 y.o. female  was evaluated in triage. Pt complains of wound vac malfunction. On coumadin. Wound vac for left illiac hematoma. Wound vac not working. Large clot in machine and tubing. No fever, emesis, CP, ABD.  Review of Systems  Positive: Wound vac malfunction Negative: Fever, chills, emesis, pain  Physical Exam  BP (!) 141/97 (BP Location: Left Arm)   Pulse 88   Temp 99.3 F (37.4 C) (Oral)   Resp 16   SpO2 93%  Gen:   Awake, no distress   Resp:  Normal effort  MSK:   Moves extremities without difficulty  SKIN:  Wound vac with large clot Other:    Medical Decision Making  Medically screening exam initiated at 6:58 PM.  Appropriate orders placed.  [name] was informed that the remainder of the evaluation will be completed by another provider, this initial triage assessment does not replace that evaluation, and the importance of remaining in the ED until their evaluation is complete.  Wound vac malfunction   Rosanna Bickle A, PA-C 01/25/21 1902    Wyvonnia Dusky, MD 01/26/21 [phone]

## 2021-01-26 DIAGNOSIS — Z4801 Encounter for change or removal of surgical wound dressing: Secondary | ICD-10-CM | POA: Diagnosis not present

## 2021-01-26 NOTE — ED Notes (Signed)
Patient verbalizes understanding of discharge instructions. Follow-up care reviewed. Education provided to pt on how to change canister. Instructions and extra canister provided to pt. Opportunity for questioning and answers were provided. Armband removed by staff, pt discharged from ED ambulatory.

## 2021-01-26 NOTE — ED Notes (Signed)
Pt canister on wound vac changed. PA made aware.
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
## 2021-01-26 NOTE — ED Notes (Signed)
PT has hx of AFIB

## 2021-01-27 ENCOUNTER — Encounter: Payer: Self-pay | Admitting: Orthopaedic Surgery

## 2021-01-27 ENCOUNTER — Ambulatory Visit (INDEPENDENT_AMBULATORY_CARE_PROVIDER_SITE_OTHER): Payer: Medicare Other | Admitting: Orthopaedic Surgery

## 2021-01-27 ENCOUNTER — Other Ambulatory Visit: Payer: Self-pay

## 2021-01-27 ENCOUNTER — Telehealth: Payer: Self-pay

## 2021-01-27 VITALS — BP 110/65 | HR 73

## 2021-01-27 DIAGNOSIS — M96840 Postprocedural hematoma of a musculoskeletal structure following a musculoskeletal system procedure: Secondary | ICD-10-CM

## 2021-01-27 NOTE — Telephone Encounter (Signed)
Lpm returning her call.

## 2021-01-27 NOTE — Telephone Encounter (Signed)
New Message:      Please give patient a call please. She said she needs to talk to you.

## 2021-01-27 NOTE — Telephone Encounter (Signed)
Lpm on availability.  I see that she rescheduled.

## 2021-01-28 ENCOUNTER — Ambulatory Visit: Payer: Medicare Other | Admitting: Orthopaedic Surgery
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
## 2021-01-28 NOTE — Telephone Encounter (Signed)
   IC [name] he stated he only has one on the floor and is able to get 1 from main OR. I discussed with Dr Lorin Mercy this as well. Patient has been notified and [name] will have for them to pick up at volunteer desk tomorrow.  [name] has been instructed that they are to charge patients account for canisters.

## 2021-01-29 ENCOUNTER — Other Ambulatory Visit: Payer: Self-pay

## 2021-01-29 ENCOUNTER — Encounter (HOSPITAL_COMMUNITY): Payer: Self-pay

## 2021-01-29 ENCOUNTER — Emergency Department (HOSPITAL_COMMUNITY)
Admission: EM | Admit: 2021-01-29 | Discharge: 2021-01-29 | Disposition: A | Discharge status: Home / Self Care | Payer: Medicare Other | Attending: Emergency Medicine | Admitting: Emergency Medicine

## 2021-01-29 DIAGNOSIS — Z87891 Personal history of nicotine dependence: Secondary | ICD-10-CM | POA: Diagnosis not present

## 2021-01-29 DIAGNOSIS — I480 Paroxysmal atrial fibrillation: Secondary | ICD-10-CM | POA: Diagnosis not present

## 2021-01-29 DIAGNOSIS — Z4801 Encounter for change or removal of surgical wound dressing: Secondary | ICD-10-CM | POA: Diagnosis not present

## 2021-01-29 DIAGNOSIS — Z7902 Long term (current) use of antithrombotics/antiplatelets: Secondary | ICD-10-CM | POA: Diagnosis not present

## 2021-01-29 DIAGNOSIS — I129 Hypertensive chronic kidney disease with stage 1 through stage 4 chronic kidney disease, or unspecified chronic kidney disease: Secondary | ICD-10-CM | POA: Insufficient documentation

## 2021-01-29 DIAGNOSIS — Z79899 Other long term (current) drug therapy: Secondary | ICD-10-CM | POA: Insufficient documentation

## 2021-01-29 DIAGNOSIS — N183 Chronic kidney disease, stage 3 unspecified: Secondary | ICD-10-CM | POA: Insufficient documentation

## 2021-01-29 DIAGNOSIS — Z5189 Encounter for other specified aftercare: Secondary | ICD-10-CM

## 2021-01-29 NOTE — Discharge Instructions (Addendum)
1.  Schedule a follow-up appointment with Dr. Lorin Mercy for ongoing wound care. 2.  At this point recommendation is for bulky ABD dressings over the wound.  Dr. Lorin Mercy has recommended removal of the wound VAC. 3.  Return to the emergency department for any worsening changing or concerning symptoms

## 2021-01-29 NOTE — Discharge Planning (Signed)
RNCM consulted regarding non functioning wound vac machine.  RNCM discussed matter with PA on call Dellis Filbert) to confirm ok to d/c wound vac as it is day 8 and due to be removed.  RNCM relayed information to ED team.   Adian Jablonowski J. Clydene Laming, RN, BSN, NCM  Transitions of Care  Nurse Case Manager  [facility] Emergency Departments  Operative Services  [phone]

## 2021-01-29 NOTE — ED Provider Notes (Signed)
Emergency Medicine Provider Triage Evaluation Note  [name] , a 60 y.o. female  was evaluated in triage.  Pt complains of wound vac problem.  States that she is out of cartridges and needs more.  States that her surgeon is supposedly "working on getting her more," but hasn't had any luck yet.  Was seen recently for the same.  Review of Systems  Positive: Wound vac problem, bleeding Negative: Fever, chills  Physical Exam  There were no vitals taken for this visit. Gen:   Awake, no distress   Resp:  Normal effort  MSK:   Moves extremities without difficulty  Other:  Wound vac to left hip  Medical Decision Making  Medically screening exam initiated at 6:03 AM.  Appropriate orders placed.  KLEO DUNGEE was informed that the remainder of the evaluation will be completed by another provider, this initial triage assessment does not replace that evaluation, and the importance of remaining in the ED until their evaluation is complete.  Wound vac problem   Montine Circle, PA-C 01/29/21 0604    Palumbo, April, MD 01/29/21 [phone]
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
## 2021-01-29 NOTE — ED Triage Notes (Signed)
Pt here due to woundvac machine isnt working anymore. Pt has "Prevena" device and doesn't have cartridges to drain into.  Seen surgery on Tuesday and was not provided with supplies.  Denies any other symptoms.

## 2021-01-30 ENCOUNTER — Telehealth: Payer: Self-pay

## 2021-01-30 NOTE — Telephone Encounter (Signed)
Daughter of patient called the office this morning concern about patient's bleeding. States that wound vac was removed at the ED and so they have no wound vac over surgical site. This message and concerns have been passed to Dr. Lorin Mercy who states that he will reach out to the daughter.

## 2021-02-03 ENCOUNTER — Encounter (HOSPITAL_COMMUNITY): Payer: Self-pay | Admitting: Orthopedic Surgery

## 2021-02-03 ENCOUNTER — Ambulatory Visit (INDEPENDENT_AMBULATORY_CARE_PROVIDER_SITE_OTHER): Payer: Medicare Other

## 2021-02-03 ENCOUNTER — Ambulatory Visit (INDEPENDENT_AMBULATORY_CARE_PROVIDER_SITE_OTHER): Payer: Medicare Other | Admitting: Orthopedic Surgery

## 2021-02-03 ENCOUNTER — Other Ambulatory Visit: Payer: Self-pay

## 2021-02-03 ENCOUNTER — Other Ambulatory Visit: Payer: Self-pay | Admitting: Internal Medicine

## 2021-02-03 ENCOUNTER — Other Ambulatory Visit: Payer: Self-pay | Admitting: Physician Assistant

## 2021-02-03 ENCOUNTER — Encounter: Payer: Self-pay | Admitting: Orthopedic Surgery

## 2021-02-03 DIAGNOSIS — I480 Paroxysmal atrial fibrillation: Secondary | ICD-10-CM | POA: Diagnosis not present

## 2021-02-03 DIAGNOSIS — Z5181 Encounter for therapeutic drug level monitoring: Secondary | ICD-10-CM

## 2021-02-03 DIAGNOSIS — L02416 Cutaneous abscess of left lower limb: Secondary | ICD-10-CM

## 2021-02-03 LAB — POCT INR: INR: 1.4 — AB (ref 2.0–3.0)

## 2021-02-03 NOTE — Patient Instructions (Signed)
Take 1 tablet tonight only and then resume taking 1/2  tablet every day except 1 tablet on Monday, Wednesday and Friday.  Physician visit today 9/6.  Recheck INR in 1 week. [facility] # [phone].
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
## 2021-02-03 NOTE — Progress Notes (Signed)
Pt last dose of coumadin was 02/02/2021

## 2021-02-03 NOTE — Progress Notes (Signed)
I Spoke with Dr.Duda PA Mary Persons pt. To hold coumadin 02/03/2021. Do not take morning of surgery.

## 2021-02-03 NOTE — Addendum Note (Signed)
Addended by: Pamella Pert on: 02/03/2021 04:31 PM   Modules accepted: Orders

## 2021-02-04 ENCOUNTER — Encounter (HOSPITAL_COMMUNITY)
Admission: RE | Disposition: A | Discharge status: Home Health | Payer: Self-pay | Source: Home / Self Care | Attending: Orthopedic Surgery

## 2021-02-04 ENCOUNTER — Inpatient Hospital Stay (HOSPITAL_COMMUNITY): Payer: Medicare Other | Admitting: Anesthesiology

## 2021-02-04 ENCOUNTER — Ambulatory Visit: Payer: Medicare Other

## 2021-02-04 ENCOUNTER — Inpatient Hospital Stay (HOSPITAL_COMMUNITY)
Admission: RE | Admit: 2021-02-04 | Discharge: 2021-03-04 | DRG: 856 | Disposition: A | Discharge status: Home Health | Payer: Medicare Other | Attending: Orthopedic Surgery | Admitting: Orthopedic Surgery

## 2021-02-04 ENCOUNTER — Encounter (HOSPITAL_COMMUNITY): Payer: Self-pay | Admitting: Orthopedic Surgery

## 2021-02-04 DIAGNOSIS — M1990 Unspecified osteoarthritis, unspecified site: Secondary | ICD-10-CM | POA: Diagnosis present

## 2021-02-04 DIAGNOSIS — D62 Acute posthemorrhagic anemia: Secondary | ICD-10-CM | POA: Diagnosis not present

## 2021-02-04 DIAGNOSIS — M7981 Nontraumatic hematoma of soft tissue: Secondary | ICD-10-CM | POA: Diagnosis not present

## 2021-02-04 DIAGNOSIS — Z23 Encounter for immunization: Secondary | ICD-10-CM | POA: Diagnosis not present

## 2021-02-04 DIAGNOSIS — Z6841 Body Mass Index (BMI) 40.0 and over, adult: Secondary | ICD-10-CM

## 2021-02-04 DIAGNOSIS — I480 Paroxysmal atrial fibrillation: Secondary | ICD-10-CM | POA: Diagnosis not present

## 2021-02-04 DIAGNOSIS — M8618 Other acute osteomyelitis, other site: Secondary | ICD-10-CM | POA: Diagnosis not present

## 2021-02-04 DIAGNOSIS — E872 Acidosis, unspecified: Secondary | ICD-10-CM | POA: Diagnosis not present

## 2021-02-04 DIAGNOSIS — J9621 Acute and chronic respiratory failure with hypoxia: Secondary | ICD-10-CM | POA: Diagnosis not present

## 2021-02-04 DIAGNOSIS — N1831 Chronic kidney disease, stage 3a: Secondary | ICD-10-CM | POA: Diagnosis present

## 2021-02-04 DIAGNOSIS — Z823 Family history of stroke: Secondary | ICD-10-CM

## 2021-02-04 DIAGNOSIS — M86152 Other acute osteomyelitis, left femur: Secondary | ICD-10-CM | POA: Diagnosis not present

## 2021-02-04 DIAGNOSIS — L02416 Cutaneous abscess of left lower limb: Secondary | ICD-10-CM | POA: Diagnosis present

## 2021-02-04 DIAGNOSIS — L97829 Non-pressure chronic ulcer of other part of left lower leg with unspecified severity: Secondary | ICD-10-CM | POA: Diagnosis present

## 2021-02-04 DIAGNOSIS — E875 Hyperkalemia: Secondary | ICD-10-CM | POA: Diagnosis not present

## 2021-02-04 DIAGNOSIS — G928 Other toxic encephalopathy: Secondary | ICD-10-CM | POA: Diagnosis not present

## 2021-02-04 DIAGNOSIS — I440 Atrioventricular block, first degree: Secondary | ICD-10-CM | POA: Diagnosis not present

## 2021-02-04 DIAGNOSIS — K4090 Unilateral inguinal hernia, without obstruction or gangrene, not specified as recurrent: Secondary | ICD-10-CM | POA: Diagnosis not present

## 2021-02-04 DIAGNOSIS — L02211 Cutaneous abscess of abdominal wall: Secondary | ICD-10-CM | POA: Diagnosis not present

## 2021-02-04 DIAGNOSIS — N179 Acute kidney failure, unspecified: Secondary | ICD-10-CM | POA: Diagnosis not present

## 2021-02-04 DIAGNOSIS — E662 Morbid (severe) obesity with alveolar hypoventilation: Secondary | ICD-10-CM | POA: Diagnosis present

## 2021-02-04 DIAGNOSIS — E785 Hyperlipidemia, unspecified: Secondary | ICD-10-CM | POA: Diagnosis not present

## 2021-02-04 DIAGNOSIS — I96 Gangrene, not elsewhere classified: Secondary | ICD-10-CM | POA: Diagnosis present

## 2021-02-04 DIAGNOSIS — I959 Hypotension, unspecified: Secondary | ICD-10-CM | POA: Diagnosis not present

## 2021-02-04 DIAGNOSIS — F32A Depression, unspecified: Secondary | ICD-10-CM | POA: Diagnosis present

## 2021-02-04 DIAGNOSIS — I4891 Unspecified atrial fibrillation: Secondary | ICD-10-CM | POA: Diagnosis present

## 2021-02-04 DIAGNOSIS — S301XXA Contusion of abdominal wall, initial encounter: Secondary | ICD-10-CM | POA: Diagnosis not present

## 2021-02-04 DIAGNOSIS — Z954 Presence of other heart-valve replacement: Secondary | ICD-10-CM

## 2021-02-04 DIAGNOSIS — M25552 Pain in left hip: Secondary | ICD-10-CM | POA: Diagnosis not present

## 2021-02-04 DIAGNOSIS — N2889 Other specified disorders of kidney and ureter: Secondary | ICD-10-CM | POA: Diagnosis not present

## 2021-02-04 DIAGNOSIS — I130 Hypertensive heart and chronic kidney disease with heart failure and stage 1 through stage 4 chronic kidney disease, or unspecified chronic kidney disease: Secondary | ICD-10-CM | POA: Diagnosis not present

## 2021-02-04 DIAGNOSIS — I4892 Unspecified atrial flutter: Secondary | ICD-10-CM | POA: Diagnosis present

## 2021-02-04 DIAGNOSIS — E781 Pure hyperglyceridemia: Secondary | ICD-10-CM | POA: Diagnosis present

## 2021-02-04 DIAGNOSIS — J449 Chronic obstructive pulmonary disease, unspecified: Secondary | ICD-10-CM | POA: Diagnosis present

## 2021-02-04 DIAGNOSIS — R011 Cardiac murmur, unspecified: Secondary | ICD-10-CM | POA: Diagnosis present

## 2021-02-04 DIAGNOSIS — Z8249 Family history of ischemic heart disease and other diseases of the circulatory system: Secondary | ICD-10-CM

## 2021-02-04 DIAGNOSIS — J9611 Chronic respiratory failure with hypoxia: Secondary | ICD-10-CM | POA: Diagnosis present

## 2021-02-04 DIAGNOSIS — M71052 Abscess of bursa, left hip: Secondary | ICD-10-CM | POA: Diagnosis not present

## 2021-02-04 DIAGNOSIS — Z7901 Long term (current) use of anticoagulants: Secondary | ICD-10-CM

## 2021-02-04 DIAGNOSIS — G4733 Obstructive sleep apnea (adult) (pediatric): Secondary | ICD-10-CM | POA: Diagnosis not present

## 2021-02-04 DIAGNOSIS — T8142XA Infection following a procedure, deep incisional surgical site, initial encounter: Principal | ICD-10-CM | POA: Diagnosis present

## 2021-02-04 DIAGNOSIS — Z9981 Dependence on supplemental oxygen: Secondary | ICD-10-CM

## 2021-02-04 DIAGNOSIS — L98499 Non-pressure chronic ulcer of skin of other sites with unspecified severity: Secondary | ICD-10-CM | POA: Diagnosis not present

## 2021-02-04 DIAGNOSIS — M109 Gout, unspecified: Secondary | ICD-10-CM | POA: Diagnosis present

## 2021-02-04 DIAGNOSIS — D649 Anemia, unspecified: Secondary | ICD-10-CM | POA: Diagnosis not present

## 2021-02-04 DIAGNOSIS — Z20822 Contact with and (suspected) exposure to covid-19: Secondary | ICD-10-CM | POA: Diagnosis present

## 2021-02-04 DIAGNOSIS — Z952 Presence of prosthetic heart valve: Secondary | ICD-10-CM | POA: Diagnosis not present

## 2021-02-04 DIAGNOSIS — I10 Essential (primary) hypertension: Secondary | ICD-10-CM | POA: Diagnosis present

## 2021-02-04 DIAGNOSIS — T847XXA Infection and inflammatory reaction due to other internal orthopedic prosthetic devices, implants and grafts, initial encounter: Secondary | ICD-10-CM | POA: Insufficient documentation

## 2021-02-04 DIAGNOSIS — I5032 Chronic diastolic (congestive) heart failure: Secondary | ICD-10-CM | POA: Diagnosis not present

## 2021-02-04 DIAGNOSIS — J849 Interstitial pulmonary disease, unspecified: Secondary | ICD-10-CM | POA: Diagnosis present

## 2021-02-04 DIAGNOSIS — D751 Secondary polycythemia: Secondary | ICD-10-CM | POA: Diagnosis present

## 2021-02-04 DIAGNOSIS — N189 Chronic kidney disease, unspecified: Secondary | ICD-10-CM | POA: Diagnosis present

## 2021-02-04 DIAGNOSIS — Z833 Family history of diabetes mellitus: Secondary | ICD-10-CM

## 2021-02-04 DIAGNOSIS — B9689 Other specified bacterial agents as the cause of diseases classified elsewhere: Secondary | ICD-10-CM | POA: Diagnosis present

## 2021-02-04 DIAGNOSIS — M009 Pyogenic arthritis, unspecified: Secondary | ICD-10-CM | POA: Diagnosis not present

## 2021-02-04 DIAGNOSIS — Z87891 Personal history of nicotine dependence: Secondary | ICD-10-CM

## 2021-02-04 DIAGNOSIS — Z9884 Bariatric surgery status: Secondary | ICD-10-CM

## 2021-02-04 DIAGNOSIS — Z809 Family history of malignant neoplasm, unspecified: Secondary | ICD-10-CM

## 2021-02-04 DIAGNOSIS — Z825 Family history of asthma and other chronic lower respiratory diseases: Secondary | ICD-10-CM

## 2021-02-04 DIAGNOSIS — Z841 Family history of disorders of kidney and ureter: Secondary | ICD-10-CM

## 2021-02-04 DIAGNOSIS — E559 Vitamin D deficiency, unspecified: Secondary | ICD-10-CM | POA: Diagnosis present

## 2021-02-04 DIAGNOSIS — R339 Retention of urine, unspecified: Secondary | ICD-10-CM | POA: Diagnosis not present

## 2021-02-04 DIAGNOSIS — I5030 Unspecified diastolic (congestive) heart failure: Secondary | ICD-10-CM | POA: Diagnosis not present

## 2021-02-04 DIAGNOSIS — K449 Diaphragmatic hernia without obstruction or gangrene: Secondary | ICD-10-CM | POA: Diagnosis not present

## 2021-02-04 HISTORY — PX: I & D EXTREMITY: SHX5045

## 2021-02-04 HISTORY — DX: Dyspnea, unspecified: R06.00

## 2021-02-04 HISTORY — PX: APPLICATION OF WOUND VAC: SHX5189

## 2021-02-04 HISTORY — DX: Depression, unspecified: F32.A

## 2021-02-04 LAB — COMPREHENSIVE METABOLIC PANEL
AG Ratio: 1.3 (calc) (ref 1.0–2.5)
ALT: 6 U/L (ref 6–29)
AST: 13 U/L (ref 10–35)
Albumin: 3.8 g/dL (ref 3.6–5.1)
Alkaline phosphatase (APISO): 61 U/L (ref 37–153)
BUN/Creatinine Ratio: 14 (calc) (ref 6–22)
BUN: 18 mg/dL (ref 7–25)
CO2: 26 mmol/L (ref 20–32)
Calcium: 10 mg/dL (ref 8.6–10.4)
Chloride: 106 mmol/L (ref 98–110)
Creat: 1.31 mg/dL — ABNORMAL HIGH (ref 0.50–1.05)
Globulin: 3 g/dL (calc) (ref 1.9–3.7)
Glucose, Bld: 86 mg/dL (ref 65–99)
Potassium: 4.3 mmol/L (ref 3.5–5.3)
Sodium: 142 mmol/L (ref 135–146)
Total Bilirubin: 0.9 mg/dL (ref 0.2–1.2)
Total Protein: 6.8 g/dL (ref 6.1–8.1)

## 2021-02-04 LAB — C-REACTIVE PROTEIN: CRP: 35 mg/L — ABNORMAL HIGH (ref <8.0)

## 2021-02-04 LAB — PROTIME-INR
INR: 1.3 — ABNORMAL HIGH (ref 0.8–1.2)
Prothrombin Time: 15.9 seconds — ABNORMAL HIGH (ref 11.4–15.2)

## 2021-02-04 LAB — GLUCOSE, CAPILLARY: Glucose-Capillary: 82 mg/dL (ref 70–99)

## 2021-02-04 LAB — SARS CORONAVIRUS 2 BY RT PCR (HOSPITAL ORDER, PERFORMED IN ~~LOC~~ HOSPITAL LAB): SARS Coronavirus 2: NEGATIVE

## 2021-02-04 LAB — SEDIMENTATION RATE: Sed Rate: 34 mm/h — ABNORMAL HIGH (ref 0–30)

## 2021-02-04 LAB — PREALBUMIN: Prealbumin: 14 mg/dL — ABNORMAL LOW (ref 17–34)

## 2021-02-04 SURGERY — IRRIGATION AND DEBRIDEMENT EXTREMITY
Anesthesia: General | Site: Hip | Laterality: Left

## 2021-02-04 MED ORDER — TRANEXAMIC ACID 1000 MG/10ML IV SOLN
2000.0000 mg | Freq: Once | INTRAVENOUS | Status: DC
  Filled 2021-02-04: qty 20

## 2021-02-04 MED ORDER — DEXAMETHASONE SODIUM PHOSPHATE 10 MG/ML IJ SOLN
INTRAMUSCULAR | Status: DC | PRN
  Administered 2021-02-04: 4 mg via INTRAVENOUS

## 2021-02-04 MED ORDER — WARFARIN - PHYSICIAN DOSING INPATIENT: Freq: Every day | Status: DC

## 2021-02-04 MED ORDER — FENTANYL CITRATE (PF) 100 MCG/2ML IJ SOLN
INTRAMUSCULAR | Status: DC | PRN
  Administered 2021-02-04: 50 ug via INTRAVENOUS
  Administered 2021-02-04: 25 ug via INTRAVENOUS

## 2021-02-04 MED ORDER — ACETAMINOPHEN 10 MG/ML IV SOLN: 1000.0000 mg | Freq: Once | INTRAVENOUS | Status: DC | PRN

## 2021-02-04 MED ORDER — VANCOMYCIN HCL 1000 MG IV SOLR
INTRAVENOUS | Status: DC | PRN
  Administered 2021-02-04: 1000 mg

## 2021-02-04 MED ORDER — ALLOPURINOL 300 MG PO TABS
300.0000 mg | ORAL_TABLET | Freq: Every day | ORAL | Status: DC
  Administered 2021-02-04 – 2021-02-25 (×22): 300 mg via ORAL
  Filled 2021-02-04 (×22): qty 1

## 2021-02-04 MED ORDER — TRANEXAMIC ACID-NACL 1000-0.7 MG/100ML-% IV SOLN
INTRAVENOUS | Status: DC | PRN
  Administered 2021-02-04: 1000 mg via INTRAVENOUS

## 2021-02-04 MED ORDER — OXYCODONE HCL 5 MG PO TABS: 5.0000 mg | ORAL_TABLET | Freq: Once | ORAL | Status: DC | PRN

## 2021-02-04 MED ORDER — CHLORHEXIDINE GLUCONATE 0.12 % MT SOLN
15.0000 mL | OROMUCOSAL | Status: AC
  Administered 2021-02-04: 15 mL via OROMUCOSAL
  Filled 2021-02-04 (×2): qty 15

## 2021-02-04 MED ORDER — PRAVASTATIN SODIUM 40 MG PO TABS
80.0000 mg | ORAL_TABLET | Freq: Every day | ORAL | Status: DC
  Administered 2021-02-05 – 2021-03-04 (×27): 80 mg via ORAL
  Filled 2021-02-04 (×27): qty 2

## 2021-02-04 MED ORDER — FUROSEMIDE 20 MG PO TABS: 20.0000 mg | ORAL_TABLET | ORAL | Status: DC

## 2021-02-04 MED ORDER — VANCOMYCIN HCL 2000 MG/400ML IV SOLN
2000.0000 mg | Freq: Once | INTRAVENOUS | Status: AC
  Administered 2021-02-04: 2000 mg via INTRAVENOUS
  Filled 2021-02-04: qty 400

## 2021-02-04 MED ORDER — PROPOFOL 10 MG/ML IV BOLUS
INTRAVENOUS | Status: DC | PRN
  Administered 2021-02-04: 100 mg via INTRAVENOUS

## 2021-02-04 MED ORDER — METOCLOPRAMIDE HCL 5 MG PO TABS: 5.0000 mg | ORAL_TABLET | Freq: Three times a day (TID) | ORAL | Status: DC | PRN

## 2021-02-04 MED ORDER — VANCOMYCIN HCL 1000 MG IV SOLR
INTRAVENOUS | Status: AC
  Filled 2021-02-04: qty 20

## 2021-02-04 MED ORDER — WARFARIN SODIUM 7.5 MG PO TABS
7.5000 mg | ORAL_TABLET | ORAL | Status: DC
  Administered 2021-02-04 – 2021-02-06 (×2): 7.5 mg via ORAL
  Filled 2021-02-04 (×2): qty 1

## 2021-02-04 MED ORDER — DOCUSATE SODIUM 100 MG PO CAPS
100.0000 mg | ORAL_CAPSULE | Freq: Two times a day (BID) | ORAL | Status: DC
  Administered 2021-02-04 – 2021-02-17 (×17): 100 mg via ORAL
  Filled 2021-02-04 (×24): qty 1

## 2021-02-04 MED ORDER — 0.9 % SODIUM CHLORIDE (POUR BTL) OPTIME
TOPICAL | Status: DC | PRN
  Administered 2021-02-04: 1000 mL

## 2021-02-04 MED ORDER — ONDANSETRON HCL 4 MG PO TABS
4.0000 mg | ORAL_TABLET | Freq: Four times a day (QID) | ORAL | Status: DC | PRN
  Administered 2021-02-14 (×2): 4 mg via ORAL
  Filled 2021-02-04 (×2): qty 1

## 2021-02-04 MED ORDER — CITALOPRAM HYDROBROMIDE 20 MG PO TABS
20.0000 mg | ORAL_TABLET | Freq: Every day | ORAL | Status: DC
  Administered 2021-02-05 – 2021-02-27 (×22): 20 mg via ORAL
  Filled 2021-02-04 (×22): qty 1

## 2021-02-04 MED ORDER — POTASSIUM CHLORIDE CRYS ER 20 MEQ PO TBCR
20.0000 meq | EXTENDED_RELEASE_TABLET | Freq: Every day | ORAL | Status: DC
  Administered 2021-02-04 – 2021-02-25 (×20): 20 meq via ORAL
  Filled 2021-02-04 (×22): qty 1

## 2021-02-04 MED ORDER — METOPROLOL TARTRATE 50 MG PO TABS
50.0000 mg | ORAL_TABLET | Freq: Every day | ORAL | Status: DC
  Administered 2021-02-05 – 2021-03-04 (×27): 50 mg via ORAL
  Filled 2021-02-04 (×28): qty 1

## 2021-02-04 MED ORDER — HYDROMORPHONE HCL 1 MG/ML IJ SOLN: 0.2500 mg | INTRAMUSCULAR | Status: DC | PRN

## 2021-02-04 MED ORDER — WARFARIN SODIUM 7.5 MG PO TABS
7.5000 mg | ORAL_TABLET | ORAL | Status: DC
  Filled 2021-02-04: qty 1

## 2021-02-04 MED ORDER — LACTATED RINGERS IV SOLN: INTRAVENOUS | Status: DC

## 2021-02-04 MED ORDER — OXYCODONE HCL 5 MG/5ML PO SOLN: 5.0000 mg | Freq: Once | ORAL | Status: DC | PRN

## 2021-02-04 MED ORDER — FUROSEMIDE 40 MG PO TABS
40.0000 mg | ORAL_TABLET | Freq: Every morning | ORAL | Status: DC
  Administered 2021-02-05 – 2021-02-14 (×8): 40 mg via ORAL
  Filled 2021-02-04 (×8): qty 1

## 2021-02-04 MED ORDER — SUCCINYLCHOLINE CHLORIDE 200 MG/10ML IV SOSY
PREFILLED_SYRINGE | INTRAVENOUS | Status: DC | PRN
  Administered 2021-02-04: 100 mg via INTRAVENOUS

## 2021-02-04 MED ORDER — VANCOMYCIN HCL 1250 MG/250ML IV SOLN
1250.0000 mg | INTRAVENOUS | Status: DC
  Administered 2021-02-05 – 2021-02-08 (×4): 1250 mg via INTRAVENOUS
  Filled 2021-02-04 (×5): qty 250

## 2021-02-04 MED ORDER — TRANEXAMIC ACID-NACL 1000-0.7 MG/100ML-% IV SOLN
INTRAVENOUS | Status: AC
  Filled 2021-02-04: qty 100

## 2021-02-04 MED ORDER — MIDAZOLAM HCL 2 MG/2ML IJ SOLN
INTRAMUSCULAR | Status: DC | PRN
  Administered 2021-02-04: 2 mg via INTRAVENOUS

## 2021-02-04 MED ORDER — ACETAMINOPHEN 325 MG PO TABS
325.0000 mg | ORAL_TABLET | Freq: Four times a day (QID) | ORAL | Status: DC | PRN
  Administered 2021-02-05 – 2021-02-13 (×5): 650 mg via ORAL
  Filled 2021-02-04 (×5): qty 2

## 2021-02-04 MED ORDER — ONDANSETRON HCL 4 MG/2ML IJ SOLN
INTRAMUSCULAR | Status: DC | PRN
  Administered 2021-02-04: 4 mg via INTRAVENOUS

## 2021-02-04 MED ORDER — SODIUM CHLORIDE 0.9 % IV SOLN
2.0000 g | Freq: Three times a day (TID) | INTRAVENOUS | Status: DC
  Administered 2021-02-04 – 2021-02-15 (×30): 2 g via INTRAVENOUS
  Filled 2021-02-04 (×31): qty 2

## 2021-02-04 MED ORDER — CEFAZOLIN IN SODIUM CHLORIDE 3-0.9 GM/100ML-% IV SOLN
3.0000 g | INTRAVENOUS | Status: AC
  Administered 2021-02-04: 3 g via INTRAVENOUS
  Filled 2021-02-04: qty 100

## 2021-02-04 MED ORDER — FUROSEMIDE 20 MG PO TABS
20.0000 mg | ORAL_TABLET | Freq: Every day | ORAL | Status: DC
  Administered 2021-02-05 – 2021-02-14 (×9): 20 mg via ORAL
  Filled 2021-02-04 (×10): qty 1

## 2021-02-04 MED ORDER — IRBESARTAN 150 MG PO TABS
150.0000 mg | ORAL_TABLET | Freq: Every day | ORAL | Status: DC
  Administered 2021-02-05 – 2021-02-12 (×8): 150 mg via ORAL
  Filled 2021-02-04 (×9): qty 1

## 2021-02-04 MED ORDER — MONTELUKAST SODIUM 10 MG PO TABS
10.0000 mg | ORAL_TABLET | Freq: Every day | ORAL | Status: DC
  Administered 2021-02-05 – 2021-03-04 (×27): 10 mg via ORAL
  Filled 2021-02-04 (×27): qty 1

## 2021-02-04 MED ORDER — GENTAMICIN SULFATE 40 MG/ML IJ SOLN
INTRAMUSCULAR | Status: AC
  Filled 2021-02-04: qty 6

## 2021-02-04 MED ORDER — VITAMIN D 25 MCG (1000 UNIT) PO TABS
6000.0000 [IU] | ORAL_TABLET | Freq: Every day | ORAL | Status: DC
  Administered 2021-02-05 – 2021-03-04 (×27): 6000 [IU] via ORAL
  Filled 2021-02-04 (×28): qty 6

## 2021-02-04 MED ORDER — LIDOCAINE 2% (20 MG/ML) 5 ML SYRINGE
INTRAMUSCULAR | Status: DC | PRN
  Administered 2021-02-04: 100 mg via INTRAVENOUS

## 2021-02-04 MED ORDER — GENTAMICIN SULFATE 40 MG/ML IJ SOLN
INTRAMUSCULAR | Status: DC | PRN
  Administered 2021-02-04 (×3): 80 mg

## 2021-02-04 MED ORDER — OXYCODONE HCL 5 MG PO TABS
5.0000 mg | ORAL_TABLET | ORAL | Status: DC | PRN
  Filled 2021-02-04: qty 2

## 2021-02-04 MED ORDER — HYDROMORPHONE HCL 1 MG/ML IJ SOLN
0.5000 mg | INTRAMUSCULAR | Status: DC | PRN
  Filled 2021-02-04 (×2): qty 0.5

## 2021-02-04 MED ORDER — WARFARIN SODIUM 2.5 MG PO TABS: 3.7500 mg | ORAL_TABLET | ORAL | Status: DC

## 2021-02-04 MED ORDER — ONDANSETRON HCL 4 MG/2ML IJ SOLN: 4.0000 mg | Freq: Four times a day (QID) | INTRAMUSCULAR | Status: DC | PRN

## 2021-02-04 MED ORDER — WARFARIN SODIUM 2.5 MG PO TABS
3.7500 mg | ORAL_TABLET | ORAL | Status: DC
  Administered 2021-02-05 – 2021-02-08 (×3): 3.75 mg via ORAL
  Filled 2021-02-04 (×3): qty 1

## 2021-02-04 MED ORDER — AMISULPRIDE (ANTIEMETIC) 5 MG/2ML IV SOLN: 10.0000 mg | Freq: Once | INTRAVENOUS | Status: DC | PRN

## 2021-02-04 MED ORDER — SODIUM CHLORIDE 0.9 % IV SOLN: INTRAVENOUS | Status: DC

## 2021-02-04 MED ORDER — ONDANSETRON HCL 4 MG/2ML IJ SOLN: 4.0000 mg | Freq: Once | INTRAMUSCULAR | Status: DC | PRN

## 2021-02-04 MED ORDER — METOCLOPRAMIDE HCL 5 MG/ML IJ SOLN: 5.0000 mg | Freq: Three times a day (TID) | INTRAMUSCULAR | Status: DC | PRN

## 2021-02-04 MED ORDER — BUPROPION HCL ER (XL) 150 MG PO TB24
300.0000 mg | ORAL_TABLET | Freq: Every day | ORAL | Status: DC
  Administered 2021-02-05 – 2021-02-27 (×22): 300 mg via ORAL
  Filled 2021-02-04 (×22): qty 2

## 2021-02-04 SURGICAL SUPPLY — 37 items
BAG COUNTER SPONGE SURGICOUNT (BAG) IMPLANT
BLADE SURG 21 STRL SS (BLADE) ×3 IMPLANT
BNDG COHESIVE 6X5 TAN STRL LF (GAUZE/BANDAGES/DRESSINGS) IMPLANT
BNDG GAUZE ELAST 4 BULKY (GAUZE/BANDAGES/DRESSINGS) IMPLANT
CANISTER WOUND CARE 500ML ATS (WOUND CARE) ×3 IMPLANT
COVER SURGICAL LIGHT HANDLE (MISCELLANEOUS) ×6 IMPLANT
DERMABOND ADVANCED (GAUZE/BANDAGES/DRESSINGS) ×1
DERMABOND ADVANCED .7 DNX12 (GAUZE/BANDAGES/DRESSINGS) ×2 IMPLANT
DRAPE U-SHAPE 47X51 STRL (DRAPES) ×3 IMPLANT
DRESSING VERAFLO CLEANS CC MED (GAUZE/BANDAGES/DRESSINGS) ×2 IMPLANT
DRSG ADAPTIC 3X8 NADH LF (GAUZE/BANDAGES/DRESSINGS) IMPLANT
DRSG VERAFLO CLEANSE CC MED (GAUZE/BANDAGES/DRESSINGS) ×3
DURAPREP 26ML APPLICATOR (WOUND CARE) ×3 IMPLANT
ELECT REM PT RETURN 9FT ADLT (ELECTROSURGICAL)
ELECTRODE REM PT RTRN 9FT ADLT (ELECTROSURGICAL) IMPLANT
GAUZE SPONGE 4X4 12PLY STRL (GAUZE/BANDAGES/DRESSINGS) IMPLANT
GLOVE SURG ORTHO LTX SZ9 (GLOVE) ×3 IMPLANT
GLOVE SURG UNDER POLY LF SZ9 (GLOVE) ×3 IMPLANT
GOWN STRL REUS W/ TWL XL LVL3 (GOWN DISPOSABLE) ×4 IMPLANT
GOWN STRL REUS W/TWL XL LVL3 (GOWN DISPOSABLE) ×6
HANDPIECE INTERPULSE COAX TIP (DISPOSABLE)
KIT BASIN OR (CUSTOM PROCEDURE TRAY) ×3 IMPLANT
KIT STIMULAN RAPID CURE  10CC (Orthopedic Implant) ×3 IMPLANT
KIT STIMULAN RAPID CURE 10CC (Orthopedic Implant) ×2 IMPLANT
KIT TURNOVER KIT B (KITS) ×3 IMPLANT
MANIFOLD NEPTUNE II (INSTRUMENTS) ×3 IMPLANT
NS IRRIG 1000ML POUR BTL (IV SOLUTION) ×3 IMPLANT
PACK ORTHO EXTREMITY (CUSTOM PROCEDURE TRAY) ×3 IMPLANT
PAD ARMBOARD 7.5X6 YLW CONV (MISCELLANEOUS) ×6 IMPLANT
SET HNDPC FAN SPRY TIP SCT (DISPOSABLE) IMPLANT
STOCKINETTE IMPERVIOUS 9X36 MD (GAUZE/BANDAGES/DRESSINGS) IMPLANT
SUT ETHILON 2 0 PSLX (SUTURE) ×3 IMPLANT
SWAB COLLECTION DEVICE MRSA (MISCELLANEOUS) ×3 IMPLANT
SWAB CULTURE ESWAB REG 1ML (MISCELLANEOUS) IMPLANT
TOWEL GREEN STERILE (TOWEL DISPOSABLE) ×3 IMPLANT
TUBE CONNECTING 12X1/4 (SUCTIONS) ×3 IMPLANT
YANKAUER SUCT BULB TIP NO VENT (SUCTIONS) ×3 IMPLANT

## 2021-02-04 NOTE — Op Note (Signed)
02/04/2021  4:05 PM  PATIENT:  [name]    PRE-OPERATIVE DIAGNOSIS:  Abscess Left pelvic wing, with abscess.  POST-OPERATIVE DIAGNOSIS:  Same  PROCEDURE: Excisional debride Left Hip Ulcer, Excision of skin and soft tissue fascia and bone. Application of stimulant antibiotic beads 1 g vancomycin 240 mg gentamicin. APPLICATION OF INSTALLATION WOUND VAC  SURGEON:  [name], MD  PHYSICIAN ASSISTANT:None ANESTHESIA:   General  PREOPERATIVE INDICATIONS:  [name] is a  60 y.o. female with a diagnosis of Abscess Left Hip who failed conservative measures and elected for surgical management.    The risks benefits and alternatives were discussed with the patient preoperatively including but not limited to the risks of infection, bleeding, nerve injury, cardiopulmonary complications, the need for revision surgery, among others, and the patient was willing to proceed.  OPERATIVE IMPLANTS: Stimulant antibiotic beads 1 g vancomycin 240 mg gentamicin  @ENCIMAGES @  OPERATIVE FINDINGS: Patient had a large deep purulent abscess adjacent to the bone with necrotic soft tissue.  The soft tissue was sent for tissue cultures and the bone was also sent for tissue culture separately.  The surgical margins were clear after debridement.  OPERATIVE PROCEDURE: Patient brought the operating room and underwent a general anesthetic.  After adequate levels anesthesia were obtained patient's left lower extremity was prepped using DuraPrep draped into a sterile field a timeout was called.  Elliptical incision was made around her previous iliac crest bone graft harvesting incision.  This was carried down through necrotic adipose tissue.  Tissue margins were resected back to healthy viable margins with a 21 blade knife.  There was a large perianal abscess the abscess and the necrotic soft tissue was sent for cultures.  This extended down to bone there was sclerotic changes to the bone and the bone was  debrided back to healthy viable margins.  A curette was used to further debride the iliac crest.  Electrocardio was used for hemostasis.  The wound was irrigated with normal saline.  A Prevena cleanse choice wound VAC was inserted with the reticulated foam against the soft tissue the soft tissue was then reapproximated over the sponge this was covered with derma tack this had a good suction fit installation was set for 18 cc of installation 10 minutes dwell time 2 hours of suction.  Patient was extubated taken the PACU in stable condition  Debridement type: Excisional Debridement  Side: left  Body Location: Iliac crest  Tools used for debridement: scalpel, curette, and rongeur  Pre-debridement Wound size (cm):   Length: 5        Width: 1     Depth: 1   Post-debridement Wound size (cm):   Length: 10        Width: 5     Depth: 5   Debridement depth beyond dead/damaged tissue down to healthy viable tissue: yes  Tissue layer involved: skin, subcutaneous tissue, muscle / fascia, bone  Nature of tissue removed: Slough, Necrotic, Devitalized Tissue, Non-viable tissue, and Purulence  Irrigation volume: 3 liters     Irrigation fluid type: Normal Saline     DISCHARGE PLANNING:  Antibiotic duration: Patient received antibiotics after cultures were obtained.  Patient will start with vancomycin and Maxipen and will adjust according to cultures  Weightbearing: Weightbearing as tolerated on the left  Pain medication: Opioid pathway  Dressing care/ Wound VAC: Continue installation wound VAC for 1 week  Ambulatory devices: Walker as needed  Discharge to: Anticipate 1 week of IV  antibiotics and installation therapy with follow-up to the operating room for wound closure.  Follow-up: In the office 1 week post operative.

## 2021-02-04 NOTE — Anesthesia Preprocedure Evaluation (Addendum)
Anesthesia Evaluation  Patient identified by MRN, date of birth, ID band Patient awake    Reviewed: Allergy & Precautions, NPO status , Patient's Chart, lab work & pertinent test results  Airway Mallampati: II  TM Distance: >3 FB Neck ROM: Full    Dental no notable dental hx. (+) Edentulous Upper, Missing   Pulmonary former smoker,    Pulmonary exam normal breath sounds clear to auscultation       Cardiovascular hypertension, Pt. on medications Normal cardiovascular exam+ dysrhythmias  Rhythm:Regular Rate:Normal  Echo 07/2019 Left ventricular ejection fraction, by estimation, is 55 to 60%. The left ventricle has normal function. The left ventricle has no regional wall motion abnormalities. Left ventricular diastolic function could not be evaluated. 2. Right ventricular systolic function is normal. The right ventricular size is mildly enlarged. There is mildly elevated pulmonary artery systolic pressure. The estimated right ventricular systolic pressure is 62.8 mmHg. 3. Left atrial size was mildly dilated. 4. 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 Mean gradient 10.7 mmHG at 67 bpm. EOA 1.35 cm2 (0.58 cm/m2). P 1/2 93 msec. Peak E 2.3 m/s. Overall, likely severe patient-prosthesis mismatch is present. . The mitral valve has been repaired/replaced. No evidence of mitral valve regurgitation. There is a 25 mm mechanical valve present in the mitral position. Procedure Date: 08/06/2014. 5. 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 Vmax 3.1 m/s, mean gradient 20 mmHG, DI 0.48, EOA 1.67 cm2 (0.72 cm/m2). Suspect moderate patient-prosthesis mismatch. Difficult to determine if there is paravalvular leak as reported on prior echocardiograms, TEE recommended to clarify. The aortic valve has been repaired/replaced. Aortic valve regurgitation is not visualized. There is a 19 mm mechanical valve present in the aortic position. Procedure Date:  08/06/2014. 6. The inferior vena cava is normal in size with greater than 50% respiratory variability, suggesting right atrial pressure of 3 mmHg.   Neuro/Psych    GI/Hepatic Neg liver ROS,   Endo/Other  negative endocrine ROS  Renal/GU Renal diseaseLab Results      Component                Value               Date                      CREATININE               1.31 (H)            02/03/2021                BUN                      18                  02/03/2021                NA                       142                 02/03/2021                K                        4.3                 02/03/2021  CL                       106                 02/03/2021                CO2                      26                  02/03/2021                Musculoskeletal  (+) Arthritis ,   Abdominal (+) - obese (BMI 44.52),   Peds  Hematology Lab Results      Component                Value               Date                      WBC                      5.6                 01/25/2021                HGB                      11.6 (L)            01/25/2021                HCT                      38.2                01/25/2021                MCV                      103.0 (H)           01/25/2021                PLT                      254                 01/25/2021              Anesthesia Other Findings   Reproductive/Obstetrics                            Anesthesia Physical Anesthesia Plan  ASA: 3  Anesthesia Plan: General   Post-op Pain Management:    Induction: Intravenous  PONV Risk Score and Plan: 4 or greater and Treatment may vary due to age or medical condition, Midazolam, Ondansetron and Dexamethasone  Airway Management Planned: Oral ETT  Additional Equipment: None  Intra-op Plan:   Post-operative Plan: Extubation in OR  Informed Consent:     Dental advisory given  Plan Discussed with:   Anesthesia Plan Comments:          Anesthesia Quick Evaluation

## 2021-02-04 NOTE — Plan of Care (Signed)
  Problem: Education: Goal: Knowledge of General Education information will improve Description: Including pain rating scale, medication(s)/side effects and non-pharmacologic comfort measures Outcome: Progressing   Problem: Activity: Goal: Risk for activity intolerance will decrease Outcome: Progressing   Problem: Nutrition: Goal: Adequate nutrition will be maintained Outcome: Progressing   Problem: Elimination: Goal: Will not experience complications related to urinary retention Outcome: Progressing   Problem: Pain Managment: Goal: General experience of comfort will improve Outcome: Progressing   

## 2021-02-04 NOTE — Transfer of Care (Signed)
Immediate Anesthesia Transfer of Care Note  Patient: [name]  Procedure(s) Performed: Debride Left Hip Ulcer (Left) APPLICATION OF WOUND VAC (Left: Hip)  Patient Location: PACU  Anesthesia Type:General  Level of Consciousness: awake, alert  and oriented  Airway & Oxygen Therapy: Patient Spontanous Breathing and Patient connected to face mask oxygen  Post-op Assessment: Report given to RN and Post -op Vital signs reviewed and stable  Post vital signs: Reviewed and stable  Last Vitals:  Vitals Value Taken Time  BP 114/72   Temp    Pulse 65 02/04/21 1556  Resp 12 02/04/21 1556  SpO2 98 % 02/04/21 1556  Vitals shown include unvalidated device data.  Last Pain:  Vitals:   02/04/21 1238  TempSrc:   PainSc: 2       Patients Stated Pain Goal: 0 (18/84/16 6063)  Complications: No notable events documented.

## 2021-02-04 NOTE — Progress Notes (Signed)
Pharmacy Antibiotic Note  [name] is a 60 y.o. female admitted on 02/04/2021 with  osteomyelitis .  Pharmacy has been consulted for vancomycin/cefepime dosing.  Scr 1.31 (baseline CKD) No previous culture data  Plan: Vancomycin 2000mg  LD x1 followed by vancomycin 1250mg  q24hr (eAUC 490) Cefepime 2gm q8hr Plan to obtain PK at steady state Will monitor for acute changes in renal function and adjust as needed F/u cultures results and de-escalate as appropriate BMP ordered with AML labs to follow up renal function  Height: 5' 6.5" (168.9 cm) Weight: 127 kg (280 lb) IBW/kg (Calculated) : 60.45  Temp (24hrs), Avg:98.1 F (36.7 C), Min:98 F (36.7 C), Max:98.2 F (36.8 C)  Recent Labs  Lab 02/03/21 1631  CREATININE 1.31*    Estimated Creatinine Clearance: 62 mL/min (A) (by C-G formula based on SCr of 1.31 mg/dL (H)).    No Known Allergies  Antimicrobials this admission: Vancomcyin 9/7 >>  Cefepime 9/7 >>   Dose adjustments this admission: none  Microbiology results: 9/7 Wound Culture: IP 8/24 MRSA PCR: negative  Thank you for allowing pharmacy to be a part of this patient's care.  Donnald Garre, PharmD Clinical Pharmacist  Please check AMION for all [place] numbers After 10:00 PM, call [place] [phone]

## 2021-02-04 NOTE — Anesthesia Postprocedure Evaluation (Signed)
Anesthesia Post Note  Patient: [name]  Procedure(s) Performed: Debride Left Hip Ulcer (Left) APPLICATION OF WOUND VAC (Left: Hip)     Patient location during evaluation: PACU Anesthesia Type: General Level of consciousness: awake and alert Pain management: pain level controlled Vital Signs Assessment: post-procedure vital signs reviewed and stable Respiratory status: spontaneous breathing, nonlabored ventilation, respiratory function stable and patient connected to nasal cannula oxygen Cardiovascular status: blood pressure returned to baseline and stable Postop Assessment: no apparent nausea or vomiting Anesthetic complications: no   No notable events documented.  Last Vitals:  Vitals:   02/04/21 1641 02/04/21 1656  BP: 119/87 137/82  Pulse: 97 68  Resp: 15 16  Temp:    SpO2: 93% 93%    Last Pain:  Vitals:   02/04/21 1656  TempSrc:   PainSc: 0-No pain                 Barnet Glasgow

## 2021-02-04 NOTE — Anesthesia Procedure Notes (Signed)
Procedure Name: Intubation Date/Time: 02/04/2021 3:10 PM Performed by: Genelle Bal, CRNA Pre-anesthesia Checklist: Patient identified, Emergency Drugs available, Suction available and Patient being monitored Patient Re-evaluated:Patient Re-evaluated prior to induction Oxygen Delivery Method: Circle system utilized Preoxygenation: Pre-oxygenation with 100% oxygen Induction Type: IV induction Ventilation: Mask ventilation without difficulty Laryngoscope Size: Miller and 2 Grade View: Grade I Tube type: Oral Tube size: 7.0 mm Number of attempts: 1 Airway Equipment and Method: Stylet and Oral airway Placement Confirmation: ETT inserted through vocal cords under direct vision, positive ETCO2 and breath sounds checked- equal and bilateral Secured at: 21 cm Tube secured with: Tape Dental Injury: Teeth and Oropharynx as per pre-operative assessment
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
## 2021-02-05 ENCOUNTER — Encounter (HOSPITAL_COMMUNITY): Payer: Self-pay | Admitting: Orthopedic Surgery

## 2021-02-05 LAB — BASIC METABOLIC PANEL
Anion gap: 7 (ref 5–15)
BUN: 23 mg/dL (ref 8–23)
CO2: 24 mmol/L (ref 22–32)
Calcium: 9.5 mg/dL (ref 8.9–10.3)
Chloride: 106 mmol/L (ref 98–111)
Creatinine, Ser: 1.38 mg/dL — ABNORMAL HIGH (ref 0.44–1.00)
GFR, Estimated: 44 mL/min — ABNORMAL LOW (ref >60)
Glucose, Bld: 137 mg/dL — ABNORMAL HIGH (ref 70–99)
Potassium: 5.1 mmol/L (ref 3.5–5.1)
Sodium: 137 mmol/L (ref 135–145)

## 2021-02-05 LAB — PROTIME-INR
INR: 1.5 — ABNORMAL HIGH (ref 0.8–1.2)
Prothrombin Time: 17.9 seconds — ABNORMAL HIGH (ref 11.4–15.2)

## 2021-02-05 LAB — SPECIMEN STATUS REPORT

## 2021-02-05 MED ORDER — ZINC SULFATE 220 (50 ZN) MG PO CAPS
220.0000 mg | ORAL_CAPSULE | Freq: Every day | ORAL | Status: DC
  Administered 2021-02-05 – 2021-03-04 (×27): 220 mg via ORAL
  Filled 2021-02-05 (×28): qty 1

## 2021-02-05 MED ORDER — ASCORBIC ACID 500 MG PO TABS
1000.0000 mg | ORAL_TABLET | Freq: Every day | ORAL | Status: DC
  Administered 2021-02-05 – 2021-03-04 (×27): 1000 mg via ORAL
  Filled 2021-02-05 (×27): qty 2

## 2021-02-05 MED ORDER — JUVEN PO PACK
1.0000 | PACK | Freq: Two times a day (BID) | ORAL | Status: DC
  Administered 2021-02-05 – 2021-02-14 (×6): 1 via ORAL
  Filled 2021-02-05 (×10): qty 1

## 2021-02-05 MED ORDER — CYANOCOBALAMIN 1000 MCG/ML IJ SOLN
1000.0000 ug | Freq: Once | INTRAMUSCULAR | Status: AC
  Administered 2021-02-05: 1000 ug via INTRAMUSCULAR
  Filled 2021-02-05: qty 1

## 2021-02-05 NOTE — Progress Notes (Signed)
Mary Persons PA changed the wound vac.  Dressing dry and intact at this time, suction at 125 continuous.

## 2021-02-05 NOTE — Progress Notes (Signed)
PA [facility] Persons aware of the critical value. No new orders received at this time.

## 2021-02-05 NOTE — Plan of Care (Signed)

## 2021-02-05 NOTE — Evaluation (Signed)
Physical Therapy Evaluation Patient Details Name: [name] MRN: [id number] DOB: 30-Apr-1961 Today's Date: 02/05/2021   History of Present Illness  Patient is a 60 year old woman who was seen in consultation for Dr. Lorin Mercy.  Patient underwent iliac crest bone harvesting on 12/26/2020 approximately 5 weeks ago.  Due to the patient's Coumadin she has had continual increased drainage from the wound and currently has a large amount of purulent drainage.   Clinical Impression  Patient received in bed, wound vac not currently functioning and beeping in room, RN aware. She is mod independent with bed mobility. Transfers with min guard and ambulated 200 feet with RW and min guard. Patient does well with mobility. Will benefit from continued skilled PT while here to improve activity tolerance for safe return home.       Follow Up Recommendations No PT follow up    Equipment Recommendations  None recommended by PT    Recommendations for Other Services       Precautions / Restrictions Precautions Precaution Comments: mod fall Restrictions Weight Bearing Restrictions: Yes LLE Weight Bearing: Weight bearing as tolerated      Mobility  Bed Mobility Overal bed mobility: Modified Independent                  Transfers Overall transfer level: Modified independent                  Ambulation/Gait Ambulation/Gait assistance: Min guard Gait Distance (Feet): 200 Feet Assistive device: Rolling walker (2 wheeled) Gait Pattern/deviations: Step-through pattern;Decreased step length - right;Decreased step length - left;Decreased stride length Gait velocity: decreased   General Gait Details: patient safe with ambulation. No pain reported.  Stairs            Wheelchair Mobility    Modified Rankin (Stroke Patients Only)       Balance Overall balance assessment: Modified Independent                                           Pertinent Vitals/Pain  Pain Assessment: No/denies pain    Home Living Family/patient expects to be discharged to:: Private residence Living Arrangements: Other relatives Available Help at Discharge: Family;Available 24 hours/day Type of Home: Apartment Home Access: Level entry     Home Layout: One level Home Equipment: Walker - 2 wheels;Walker - standard Additional Comments: on chronic oxygen- "drops quickly/goes back up quickly". Has a concentrator just in case but does not check regularly    Prior Function Level of Independence: Independent         Comments: Pt reports she was independent with ADLs, and IADLs.  Drives, grocery shops.  She is not employed due to disability due to Rt LE lymphedema     Hand Dominance   Dominant Hand: Right    Extremity/Trunk Assessment   Upper Extremity Assessment Upper Extremity Assessment: Overall WFL for tasks assessed    Lower Extremity Assessment Lower Extremity Assessment: Overall WFL for tasks assessed    Cervical / Trunk Assessment Cervical / Trunk Assessment: Normal  Communication   Communication: No difficulties  Cognition Arousal/Alertness: Awake/alert Behavior During Therapy: WFL for tasks assessed/performed Overall Cognitive Status: Within Functional Limits for tasks assessed  General Comments: very pleasant      General Comments      Exercises     Assessment/Plan    PT Assessment Patient needs continued PT services  PT Problem List Decreased strength;Decreased mobility;Decreased activity tolerance       PT Treatment Interventions Gait training;Functional mobility training;Therapeutic activities;Patient/family education;Therapeutic exercise    PT Goals (Current goals can be found in the Care Plan section)  Acute Rehab PT Goals Patient Stated Goal: to get better, return home PT Goal Formulation: With patient Time For Goal Achievement: 02/19/21 Potential to Achieve Goals: Good     Frequency Min 2X/week   Barriers to discharge        Co-evaluation               AM-PAC PT "6 Clicks" Mobility  Outcome Measure Help needed turning from your back to your side while in a flat bed without using bedrails?: A Little Help needed moving from lying on your back to sitting on the side of a flat bed without using bedrails?: A Little Help needed moving to and from a bed to a chair (including a wheelchair)?: A Little Help needed standing up from a chair using your arms (e.g., wheelchair or bedside chair)?: A Little Help needed to walk in hospital room?: A Little Help needed climbing 3-5 steps with a railing? : A Little 6 Click Score: 18    End of Session Equipment Utilized During Treatment: Gait belt Activity Tolerance: Patient tolerated treatment well Patient left: in bed;with call bell/phone within reach Nurse Communication: Mobility status PT Visit Diagnosis: Difficulty in walking, not elsewhere classified (R26.2);Muscle weakness (generalized) (M62.81)    Time: 1100-1117 PT Time Calculation (min) (ACUTE ONLY): 17 min   Charges:   PT Evaluation $PT Eval Moderate Complexity: 1 Mod PT Treatments $Gait Training: 8-22 mins        [employer], PT, [employer] 02/05/21,12:49 PM

## 2021-02-05 NOTE — Progress Notes (Signed)
Patient's wound vac started leaking again after patient's moving in and out of bed to the bathroom multiple times.  Reinforced dressing, leak still present.  Mary Persons PA made aware.

## 2021-02-05 NOTE — Progress Notes (Signed)
Wound vac received with leakage alarm.  Left side of the dressing where the tubing has a break in the dressing seal, as well as in the abdominal crease part.  Reinforced these parts but leakage comes back especially after patient mobility and transfers.  MA Persons, PA, notified.

## 2021-02-05 NOTE — Progress Notes (Signed)
Date and time results received: 02/05/21 1141 (use smartphrase ".now" to insert current time)  Test: gram stain done on 02/04/21 Critical Value: no organism seen; seen a couple of gram negative rods  Name of Provider Notified: Dr. Eather Colas  Orders Received? Or Actions Taken?: Orders Received - See Orders for details

## 2021-02-05 NOTE — Progress Notes (Signed)
Patient is postop day 1 status post irrigation and debridement left hip wound. Wound VAC was signaling a leak.  Patient said this started overnight.  Nurses have been reinforcing this to make it functional.  Vital signs stable afebrile patient is comfortable.  Birth with wound VAC started functioning again.  Spoke with nursing if wound VAC continues to malfunction will contact wound care to see if they can replace it with regular wound VAC.  Also have added B12 vitamin C zinc and nutritional powder per Dr. Jess Barters request to maximize nutrition for healing.  Cultures today currently bone culture is negative.  Soft tissue culture is demonstrating rare Gram positive cocci and rare gram-negative rods we will continue to follow.  Patient currently on vancomycin and cefepime

## 2021-02-05 NOTE — Progress Notes (Signed)
OT Cancellation Note  Patient Details Name: [name] MRN: [id number] DOB: [birth year]   Cancelled Treatment:    Reason Eval/Treat Not Completed: Patient at procedure or test/ unavailable. Patient undergoing wound vac change. OT to check back as time allows.  Gloris Manchester OTR/L Supplemental OT, Department of rehab services [phone]  Raelene Trew R H. 02/05/2021, 12:02 PM

## 2021-02-05 NOTE — Progress Notes (Signed)
Pt's wound vac shows persistent leak, Dr Ninfa Linden notified and said just keep it on.

## 2021-02-05 NOTE — Evaluation (Signed)
Occupational Therapy Evaluation Patient Details Name: [name] MRN: [id number] DOB: 1960-11-23 Today's Date: 02/05/2021    History of Present Illness Patient is a 60 year old woman who was seen in consultation for Dr. Lorin Mercy.  Patient underwent iliac crest bone harvesting on 12/26/2020 approximately 5 weeks ago.  Due to the patient's Coumadin she has had continual increased drainage from the wound and currently has a large amount of purulent drainage.   Clinical Impression   PTA patient was living with family in a ground level apartment and was grossly i with ADLs/IADLs without AD. Patient currently functioning slightly below baseline demonstrating observed ADLs including 3/3 parts of toileting and LB dressing with supervision to [place] A grossly. Patient also limited by deficits listed below including decreased balance and would benefit from continued acute OT services in prep for safe d/c home with family. Patient reports admission with likely be 7 days. OT will continue to follow acutely to maximize safety/independence with self-care tasks in prep for /dc home.      Follow Up Recommendations  No OT follow up;Supervision - Intermittent    Equipment Recommendations  None recommended by OT    Recommendations for Other Services       Precautions / Restrictions Precautions Precautions: Fall Precaution Comments: mod fall Restrictions Weight Bearing Restrictions: Yes LLE Weight Bearing: Weight bearing as tolerated      Mobility Bed Mobility Overal bed mobility: Modified Independent                  Transfers Overall transfer level: Needs assistance Equipment used: Rolling walker (2 wheeled) Transfers: Sit to/from [facility] Sit to Stand: Supervision Stand pivot transfers: Supervision       General transfer comment: Supervision A for line management.    Balance Overall balance assessment: Needs assistance Sitting-balance support: No upper extremity  supported;Feet supported Sitting balance-Leahy Scale: Good     Standing balance support: Single extremity supported;Bilateral upper extremity supported;During functional activity Standing balance-Leahy Scale: Good Standing balance comment: Able to maintain static standing balance with and without UE support on RW.                           ADL either performed or assessed with clinical judgement   ADL Overall ADL's : Needs assistance/impaired     Grooming: Supervision/safety;Standing Grooming Details (indicate cue type and reason): Supervision A for line management with hand washing standing at sink level.             Lower Body Dressing: Minimal assistance;Sit to/from stand Lower Body Dressing Details (indicate cue type and reason): Reports difficulty PTA with donning footwear 2/2 body habitus. Toilet Transfer: Consulting civil engineer Details (indicate cue type and reason): Supervision A for line management. Toileting- Clothing Manipulation and Hygiene: Supervision/safety       Functional mobility during ADLs: Supervision/safety;Rolling walker       Vision   Vision Assessment?: No apparent visual deficits     Perception     Praxis      Pertinent Vitals/Pain Pain Assessment: 0-10 Pain Score: 4  Pain Location: L hip Pain Descriptors / Indicators: Aching;Sore Pain Intervention(s): Monitored during session;Other (comment) (Patient declines pain meds)     Hand Dominance Right   Extremity/Trunk Assessment Upper Extremity Assessment Upper Extremity Assessment: Overall WFL for tasks assessed   Lower Extremity Assessment Lower Extremity Assessment: Overall WFL for tasks assessed   Cervical / Trunk Assessment Cervical / Trunk Assessment: Other  exceptions Cervical / Trunk Exceptions: Large body habitus   Communication Communication Communication: No difficulties   Cognition Arousal/Alertness: Awake/alert Behavior During Therapy: WFL  for tasks assessed/performed Overall Cognitive Status: Within Functional Limits for tasks assessed                                 General Comments: very pleasant   General Comments  Wound vac in place.    Exercises     Shoulder Instructions      Home Living Family/patient expects to be discharged to:: Private residence Living Arrangements: Other relatives Available Help at Discharge: Family;Available 24 hours/day Type of Home: Apartment Home Access: Level entry     Home Layout: One level     Bathroom Shower/Tub: Teacher, early years/pre: Standard Bathroom Accessibility: Yes   Home Equipment: Walker - 2 wheels;Walker - standard;Other (comment) (2L home O2)   Additional Comments: on chronic oxygen- "drops quickly/goes back up quickly". Has a concentrator just in case but does not check regularly      Prior Functioning/Environment Level of Independence: Independent        Comments: I with ADLs/IADLs including driving and grocery shopping. Reports owning a RW but does not rely on it at baseline. On unemployment 2/2 to Rt LE lymphedema        OT Problem List: Decreased activity tolerance;Impaired balance (sitting and/or standing);Decreased knowledge of use of DME or AE;Cardiopulmonary status limiting activity      OT Treatment/Interventions: Self-care/ADL training;Therapeutic exercise;Energy conservation;DME and/or AE instruction;Therapeutic activities;Patient/family education;Balance training    OT Goals(Current goals can be found in the care plan section) Acute Rehab OT Goals Patient Stated Goal: to get better, return home OT Goal Formulation: With patient Time For Goal Achievement: 02/19/21 Potential to Achieve Goals: Good ADL Goals Pt Will Perform Grooming: with modified independence;standing Pt Will Perform Upper Body Bathing: with modified independence;sitting Pt Will Perform Lower Body Bathing: with modified independence;sit to/from  stand Pt Will Perform Upper Body Dressing: Independently Pt Will Perform Lower Body Dressing: with modified independence;with adaptive equipment;sit to/from stand Pt Will Transfer to Toilet: with modified independence;ambulating Pt Will Perform Toileting - Clothing Manipulation and hygiene: with modified independence;sit to/from stand Pt Will Perform Tub/Shower Transfer: Tub transfer;rolling walker Pt/caregiver will Perform Home Exercise Program: Increased ROM;Increased strength;Both right and left upper extremity;With written HEP provided  OT Frequency: Min 2X/week   Barriers to D/C:            Co-evaluation              AM-PAC OT "6 Clicks" Daily Activity     Outcome Measure Help from another person eating meals?: None Help from another person taking care of personal grooming?: A Little Help from another person toileting, which includes using toliet, bedpan, or urinal?: A Little Help from another person bathing (including washing, rinsing, drying)?: A Little Help from another person to put on and taking off regular upper body clothing?: None Help from another person to put on and taking off regular lower body clothing?: A Little 6 Click Score: 20   End of Session Equipment Utilized During Treatment: Rolling walker Nurse Communication: Mobility status  Activity Tolerance: Patient tolerated treatment well Patient left: in bed;with call bell/phone within reach;with bed alarm set  OT Visit Diagnosis: Unsteadiness on feet (R26.81)                Time: 3086-5784 OT Time  Calculation (min): 17 min Charges:  OT General Charges $OT Visit: 1 Visit OT Evaluation $OT Eval Low Complexity: 1 Low  Brexlee Heberlein H. OTR/L Supplemental OT, Department of rehab services [phone]  Dosha Broshears R H. 02/05/2021, 1:55 PM

## 2021-02-06 LAB — BASIC METABOLIC PANEL
Anion gap: 7 (ref 5–15)
BUN: 24 mg/dL — ABNORMAL HIGH (ref 8–23)
CO2: 25 mmol/L (ref 22–32)
Calcium: 10 mg/dL (ref 8.9–10.3)
Chloride: 106 mmol/L (ref 98–111)
Creatinine, Ser: 1.31 mg/dL — ABNORMAL HIGH (ref 0.44–1.00)
GFR, Estimated: 46 mL/min — ABNORMAL LOW (ref >60)
Glucose, Bld: 81 mg/dL (ref 70–99)
Potassium: 4.3 mmol/L (ref 3.5–5.1)
Sodium: 138 mmol/L (ref 135–145)

## 2021-02-06 LAB — PROTIME-INR
INR: 1.6 — ABNORMAL HIGH (ref 0.8–1.2)
Prothrombin Time: 19.4 seconds — ABNORMAL HIGH (ref 11.4–15.2)

## 2021-02-06 NOTE — Progress Notes (Signed)
Patient is status post irrigation and debridement iliac crest wound.  Wound VAC again failed last night around 3 in the morning.  On-call doc was called who transitioned to wet-to-dry dressing changes. Patient is comfortable.  Cultures are growing Serratia.  Will discuss with infectious disease pharmacist if change in therapy would be appropriate now or wait until final sensitivities come back.  Will discuss with on-call doc best way to proceed with wound VAC.

## 2021-02-06 NOTE — Progress Notes (Addendum)
Return to hospital to reapply dressing per Dr. Jess Barters request.  Had nurse practitioner with me.  We did remove the packing from last night.  She does have purulent drainage from the wound but no surrounding cellulitis.  She does have some skin changes from the [place].  Discussed with Dr. Sharol Given as we attempted to place another VAC and seal was not consistent.  At this point he asked that we take the stitches and the sponge out and begin wet-to-dry dressing changes with Kerlix.  Order was written for this.

## 2021-02-06 NOTE — Progress Notes (Signed)
Wound vac leaking moderate-excessive amount of serosanguineous fluid without adequate seal to device. Re-securement attempted without success x2. Dressing removed and covered with wet to dry dressing. New dressing kit ordered. Dr. Autumn Patty notified. Plan to re-apply upon receipt of dressing kit.

## 2021-02-07 LAB — PROTIME-INR
INR: 1.9 — ABNORMAL HIGH (ref 0.8–1.2)
Prothrombin Time: 21.5 seconds — ABNORMAL HIGH (ref 11.4–15.2)

## 2021-02-07 NOTE — Progress Notes (Signed)
Patient ID: [name], female   DOB: [birth year], 60 y.o.   MRN: [id number] The patient has been started on wet-to-dry dressings for her left anterior iliac crest wound.  I remove the pack dressing at the bedside this morning.  Fortunately there is no gross purulence.  There is good granulation tissue.  There still some of the stimulant antibiotic beads in the wound and I think the soupiness of that wound yesterday was related to the stimulant beads.  A new wet-to-dry dressing was placed.  She may end up needing a formal VAC sponge that she can be sent home with with VAC changes 2-3 times a week.  For now, we will continue wet-to-dry dressings.  This can be changed again tomorrow.  Dr. Sharol Given will reassess her on Monday.  She may also be a candidate for hydrotherapy.

## 2021-02-07 NOTE — Plan of Care (Signed)

## 2021-02-07 NOTE — Progress Notes (Signed)
Occupational Therapy Treatment Patient Details Name: [name] MRN: [id number] DOB: 1961/05/31 Today's Date: 02/07/2021    History of present illness Patient is a 60 year old woman who was seen in consultation for Dr. Lorin Mercy.  Patient underwent iliac crest bone harvesting on 12/26/2020 approximately 5 weeks ago.  Due to the patient's Coumadin she has had continual increased drainage from the wound and currently has a large amount of purulent drainage.   OT comments  Pt making steady progress towards OT goals this session. Session focus on BADL reeducation. Pt currently requires supervision for ADL transfers with RW and supervision for 3/3 toileting tasks as well as standing grooming at sink. Pt would continue to benefit from skilled occupational therapy while admitted and after d/c to address the below listed limitations in order to improve overall functional mobility and facilitate independence with BADL participation. DC plan remains appropriate, will follow acutely per POC.     Follow Up Recommendations  No OT follow up;Supervision - Intermittent    Equipment Recommendations  None recommended by OT    Recommendations for Other Services      Precautions / Restrictions Precautions Precautions: Fall Precaution Comments: mod fall Restrictions Weight Bearing Restrictions: Yes LLE Weight Bearing: Weight bearing as tolerated       Mobility Bed Mobility Overal bed mobility: Modified Independent                  Transfers Overall transfer level: Needs assistance Equipment used: Rolling walker (2 wheeled);None Transfers: Sit to/from Stand Sit to Stand: Supervision         General transfer comment: pt completed sit<>stand from EOB and toilet with RW and without AD with gross supervision    Balance Overall balance assessment: Needs assistance Sitting-balance support: No upper extremity supported;Feet supported Sitting balance-Leahy Scale: Good     Standing  balance support: No upper extremity supported;During functional activity Standing balance-Leahy Scale: Fair Standing balance comment: pt completed dynamic balance tasks during ADLs with no LOB                           ADL either performed or assessed with clinical judgement   ADL Overall ADL's : Needs assistance/impaired     Grooming: Wash/dry hands;Standing;Supervision/safety       Lower Body Bathing: Supervison/ safety;Sit to/from stand Lower Body Bathing Details (indicate cue type and reason): simulated via posterior pericare         Toilet Transfer: Supervision/safety;RW;Regular Glass blower/designer Details (indicate cue type and reason): supervision for safety Toileting- Clothing Manipulation and Hygiene: Supervision/safety;Sit to/from stand       Functional mobility during ADLs: Supervision/safety;Rolling walker General ADL Comments: pt completed 3/3 toileting tasks with supervision, and UB grooming tasks with supervision. noted improvements in pain and activity tolerance     Vision       Perception     Praxis      Cognition Arousal/Alertness: Awake/alert Behavior During Therapy: WFL for tasks assessed/performed Overall Cognitive Status: Within Functional Limits for tasks assessed                                          Exercises     Shoulder Instructions       General Comments wound dressed, appeared to be clean and dry    Pertinent Vitals/ Pain  Pain Assessment: Faces Faces Pain Scale: Hurts a little bit Pain Location: L hip Pain Descriptors / Indicators: Sore Pain Intervention(s): Monitored during session;Repositioned  Home Living                                          Prior Functioning/Environment              Frequency  Min 2X/week        Progress Toward Goals  OT Goals(current goals can now be found in the care plan section)  Progress towards OT goals: Progressing toward  goals  Acute Rehab OT Goals Patient Stated Goal: for wound to heal OT Goal Formulation: With patient Time For Goal Achievement: 02/19/21 Potential to Achieve Goals: Good  Plan Discharge plan remains appropriate;Frequency remains appropriate    Co-evaluation                 AM-PAC OT "6 Clicks" Daily Activity     Outcome Measure   Help from another person eating meals?: None Help from another person taking care of personal grooming?: None Help from another person toileting, which includes using toliet, bedpan, or urinal?: None Help from another person bathing (including washing, rinsing, drying)?: A Little Help from another person to put on and taking off regular upper body clothing?: None Help from another person to put on and taking off regular lower body clothing?: A Little 6 Click Score: 22    End of Session Equipment Utilized During Treatment: Rolling walker  OT Visit Diagnosis: Unsteadiness on feet (R26.81)   Activity Tolerance Patient tolerated treatment well   Patient Left in bed;with call bell/phone within reach   Nurse Communication Mobility status        Time: 1250-1306 OT Time Calculation (min): 16 min  Charges: OT General Charges $OT Visit: 1 Visit OT Treatments $Self Care/Home Management : 8-22 mins  Harley Alto., COTA/L Acute Rehabilitation Services [phone]   [name] 02/07/2021, 1:42 PM

## 2021-02-08 LAB — PROTIME-INR
INR: 2.2 — ABNORMAL HIGH (ref 0.8–1.2)
Prothrombin Time: 24.4 seconds — ABNORMAL HIGH (ref 11.4–15.2)

## 2021-02-08 MED ORDER — INFLUENZA VAC SPLIT QUAD 0.5 ML IM SUSY
0.5000 mL | PREFILLED_SYRINGE | INTRAMUSCULAR | Status: AC
  Administered 2021-02-09: 0.5 mL via INTRAMUSCULAR
  Filled 2021-02-08: qty 0.5

## 2021-02-08 NOTE — Progress Notes (Signed)
   Subjective:  No acute events overnight.  Resting comfortably this AM.  Pain well controlled.  Denies fevers, chills, or other systemic symptoms.   Objective:   VITALS:   Vitals:   02/07/21 2051 02/08/21 0512 02/08/21 0851 02/08/21 0931  BP: (!) 107/57 (!) 94/49 110/85 (!) 111/46  Pulse: 62 60 (!) 102 68  Resp: 18  19 18   Temp: 98.3 F (36.8 C) 98.2 F (36.8 C) 98.2 F (36.8 C) 98.2 F (36.8 C)  TempSrc: Oral Oral Oral Oral  SpO2: 95% 94% 96% 95%  Weight:      Height:        Gen: Resting comfortably Pulm: Normal WOB on RA CV: Normal rate, extremities warm and well perfused L hip.  Packing removed, healthy appearing granulation tissue w/ some fibrinous material, no purulence, wound less soupy today w/ no visible stimulan beads.     Lab Results  Component Value Date   WBC 7.7 02/03/2021   HGB 11.6 02/03/2021   HCT 39.1 02/03/2021   MCV 100 (H) 02/03/2021   PLT 254 01/25/2021     Assessment/Plan:  4 Days Post-Op s/p I&D L iliac crest wound.  Wet-to-dry dressing changed this AM; no gross purulence, healthy appearing granulation tissue  Will be seen again today w/ additional wound care recs to follow   [name] [name] 02/08/2021, 10:08 AM [phone]

## 2021-02-09 ENCOUNTER — Other Ambulatory Visit: Payer: Self-pay | Admitting: Physician Assistant

## 2021-02-09 LAB — SPECIMEN STATUS REPORT

## 2021-02-09 LAB — BASIC METABOLIC PANEL
Anion gap: 8 (ref 5–15)
BUN: 26 mg/dL — ABNORMAL HIGH (ref 8–23)
CO2: 23 mmol/L (ref 22–32)
Calcium: 9.7 mg/dL (ref 8.9–10.3)
Chloride: 108 mmol/L (ref 98–111)
Creatinine, Ser: 1.32 mg/dL — ABNORMAL HIGH (ref 0.44–1.00)
GFR, Estimated: 46 mL/min — ABNORMAL LOW (ref >60)
Glucose, Bld: 110 mg/dL — ABNORMAL HIGH (ref 70–99)
Potassium: 3.5 mmol/L (ref 3.5–5.1)
Sodium: 139 mmol/L (ref 135–145)

## 2021-02-09 LAB — AEROBIC/ANAEROBIC CULTURE W GRAM STAIN (SURGICAL/DEEP WOUND)

## 2021-02-09 LAB — CBC WITH DIFFERENTIAL
Basophils Absolute: 0 10*3/uL (ref 0.0–0.2)
Basos: 0 %
EOS (ABSOLUTE): 0.2 10*3/uL (ref 0.0–0.4)
Eos: 2 %
Hematocrit: 39.1 % (ref 34.0–46.6)
Hemoglobin: 11.6 g/dL (ref 11.1–15.9)
Immature Grans (Abs): 0.1 10*3/uL (ref 0.0–0.1)
Immature Granulocytes: 1 %
Lymphocytes Absolute: 0.9 10*3/uL (ref 0.7–3.1)
Lymphs: 12 %
MCH: 29.7 pg (ref 26.6–33.0)
MCHC: 29.7 g/dL — ABNORMAL LOW (ref 31.5–35.7)
MCV: 100 fL — ABNORMAL HIGH (ref 79–97)
Monocytes Absolute: 0.6 10*3/uL (ref 0.1–0.9)
Monocytes: 8 %
Neutrophils Absolute: 5.9 10*3/uL (ref 1.4–7.0)
Neutrophils: 77 %
RBC: 3.9 x10E6/uL (ref 3.77–5.28)
RDW: 15.4 % (ref 11.7–15.4)
WBC: 7.7 10*3/uL (ref 3.4–10.8)

## 2021-02-09 LAB — PROTIME-INR
INR: 2.3 — ABNORMAL HIGH (ref 0.8–1.2)
Prothrombin Time: 25.4 seconds — ABNORMAL HIGH (ref 11.4–15.2)

## 2021-02-09 NOTE — Care Management Important Message (Signed)
Important Message  Patient Details  Name: [name] MRN: [id number] Date of Birth: 1960/08/20   Medicare Important Message Given:  Yes     Jackson Coffield 02/09/2021, 4:09 PM

## 2021-02-09 NOTE — Progress Notes (Signed)
Patient ID: [name], female   DOB: 21-Jan-1961, 60 y.o.   MRN: [id number] Patient is status post debridement abscess left iliac crest.  Both the soft tissue and bone cultures are positive for Serratia.  Patient has antibiotic beads in place with vancomycin and gentamicin and has IV antibiotics with Maxipime.  Continue packing the wound open 3 times a day we will plan for return to surgery on Wednesday possible wound closure at that time.  And discharged on oral Cipro.

## 2021-02-09 NOTE — Progress Notes (Signed)
Pharmacy Antibiotic Note  [name] is a 60 y.o. female admitted on 02/04/2021 with  osteomyelitis  - both soft tissue and bone cultures growing serratia. S/p I&D, Ortho planning further procedure on 9/14. Pharmacy has been consulted for cefepime dosing. Per Dr. Jess Barters note, planning Cipro on d/c. Will need to monitor INR closely on warfarin.  Scr 1.31 (baseline CKD)  Plan: Cefepime 2g IV q8h Monitor clinical progress, c/s, renal function F/u de-escalation plan/LOT   Height: 5' 6.5" (168.9 cm) Weight: 127 kg (280 lb) IBW/kg (Calculated) : 60.45  Temp (24hrs), Avg:98.2 F (36.8 C), Min:97.9 F (36.6 C), Max:98.6 F (37 C)  Recent Labs  Lab 02/03/21 1631 02/05/21 0313 02/06/21 0354  WBC 7.7  --   --   CREATININE 1.31* 1.38* 1.31*     Estimated Creatinine Clearance: 62 mL/min (A) (by C-G formula based on SCr of 1.31 mg/dL (H)).    No Known Allergies   Arturo Morton, PharmD, BCPS Please check AMION for all [place] contact numbers Clinical Pharmacist 02/09/2021 12:30 PM

## 2021-02-09 NOTE — Progress Notes (Signed)
Physical Therapy Treatment Patient Details Name: [name] MRN: [id number] DOB: [birth year] Today's Date: 02/09/2021   History of Present Illness Patient is a 60 year old woman who was seen in consultation for Dr. Lorin Mercy.  Patient underwent iliac crest bone harvesting on 12/26/2020 approximately 5 weeks ago.  Due to the patient's Coumadin she has had continual increased drainage from the wound and currently has a large amount of purulent drainage.    PT Comments    Continuing work on functional mobility and activity tolerance;  Excellent progress and pt is very near Modified independence for amb with RW; Educated pt on importance of ambulating in the hallways daily, and she is safe to do so; will likely meet goals in the next 1 or 2 sessions -- will plan to see at least once after her surgery for wound closure on Wednesday   Recommendations for follow up therapy are one component of a multi-disciplinary discharge planning process, led by the attending physician.  Recommendations may be updated based on patient status, additional functional criteria and insurance authorization.  Follow Up Recommendations  No PT follow up     Equipment Recommendations  None recommended by PT    Recommendations for Other Services       Precautions / Restrictions Precautions Precautions: Fall Precaution Comments: Fall risk present, but very minimal Restrictions LLE Weight Bearing: Weight bearing as tolerated     Mobility  Bed Mobility Overal bed mobility: Modified Independent                  Transfers Overall transfer level: Needs assistance   Transfers: Sit to/from Stand Sit to Stand: Supervision         General transfer comment: pt completed sit<>stand from EOB and toilet with RW and without AD with gross supervision  Ambulation/Gait Ambulation/Gait assistance: Supervision Gait Distance (Feet): 320 Feet Assistive device: Rolling walker (2 wheeled) Gait Pattern/deviations:  Step-through pattern;Decreased step length - right;Decreased step length - left;Decreased stride length Gait velocity: decreased   General Gait Details: patient safe with ambulation. No pain reported.   Stairs             Wheelchair Mobility    Modified Rankin (Stroke Patients Only)       Balance     Sitting balance-Leahy Scale: Good       Standing balance-Leahy Scale: Fair                              Cognition Arousal/Alertness: Awake/alert Behavior During Therapy: WFL for tasks assessed/performed Overall Cognitive Status: Within Functional Limits for tasks assessed                                 General Comments: very pleasant      Exercises      General Comments General comments (skin integrity, edema, etc.): Discussed plan of care and current functional status; she is moving quite well and safe to walk the hallways with RW      Pertinent Vitals/Pain Pain Assessment: Faces Faces Pain Scale: Hurts a little bit Pain Location: L hip Pain Descriptors / Indicators: Sore Pain Intervention(s): Monitored during session    Home Living                      Prior Function            PT  Goals (current goals can now be found in the care plan section) Acute Rehab PT Goals Patient Stated Goal: for wound to heal PT Goal Formulation: With patient Time For Goal Achievement: 02/19/21 Potential to Achieve Goals: Good Progress towards PT goals: Progressing toward goals (will likely meet PT goals in 1-2 sessions)    Frequency    Min 2X/week (will likely meet PT goals next session)      PT Plan Current plan remains appropriate    Co-evaluation              AM-PAC PT "6 Clicks" Mobility   Outcome Measure  Help needed turning from your back to your side while in a flat bed without using bedrails?: None Help needed moving from lying on your back to sitting on the side of a flat bed without using bedrails?:  None Help needed moving to and from a bed to a chair (including a wheelchair)?: None Help needed standing up from a chair using your arms (e.g., wheelchair or bedside chair)?: None Help needed to walk in hospital room?: None Help needed climbing 3-5 steps with a railing? : A Little 6 Click Score: 23    End of Session Equipment Utilized During Treatment: Gait belt Activity Tolerance: Patient tolerated treatment well Patient left: in bed;with call bell/phone within reach Nurse Communication: Mobility status PT Visit Diagnosis: Difficulty in walking, not elsewhere classified (R26.2);Muscle weakness (generalized) (M62.81)     Time: 6314-9702 PT Time Calculation (min) (ACUTE ONLY): 18 min  Charges:  $Gait Training: 8-22 mins                     Roney Marion, [place]  Acute Rehabilitation Services Pager [phone] Office [phone]    Colletta [place] 02/09/2021, 5:47 PM

## 2021-02-09 NOTE — Consult Note (Addendum)
[facility] CM Inpatient Consult   02/09/2021  [name] [birth year] [id number]  [facility] [ACO] Patient: CMS Medicare   Patient chart has been reviewed for readmissions less than 30 days and for high risk score for unplanned readmissions. Patient assessed for post hospital care management follow up needs.  Chart review reveals patient's primary care provider is not affiliated with [facility], therefore Branch team does not follow.   Of note, [facility] Care Management services does not replace or interfere with any services that are arranged by inpatient case management or social work.   Netta Cedars, MSN, [facility] Liaison Nurse Mobile Phone [phone]  Toll free office [phone]

## 2021-02-10 ENCOUNTER — Other Ambulatory Visit: Payer: Self-pay | Admitting: Interventional Cardiology

## 2021-02-10 LAB — AEROBIC/ANAEROBIC CULTURE W GRAM STAIN (SURGICAL/DEEP WOUND)

## 2021-02-10 LAB — PROTIME-INR
INR: 2.1 — ABNORMAL HIGH (ref 0.8–1.2)
Prothrombin Time: 23.4 seconds — ABNORMAL HIGH (ref 11.4–15.2)

## 2021-02-10 MED ORDER — SODIUM CHLORIDE 0.9% FLUSH
10.0000 mL | Freq: Two times a day (BID) | INTRAVENOUS | Status: DC
Start: 2021-02-10 — End: 2021-03-04
  Administered 2021-02-10 – 2021-03-04 (×24): 10 mL

## 2021-02-10 MED ORDER — SODIUM CHLORIDE 0.9% FLUSH
10.0000 mL | INTRAVENOUS | Status: DC | PRN
  Administered 2021-02-14: 10 mL

## 2021-02-10 NOTE — Progress Notes (Signed)
Patient ID: [name], female   DOB: 1960-11-20, 60 y.o.   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  We will plan for repeat debridement tomorrow Wednesday with anticipated wound closure anticipate discharge on oral antibiotics in several days after surgery.

## 2021-02-10 NOTE — H&P (View-Only) (Signed)
Patient ID: [name], female   DOB: 03-21-1961, 60 y.o.   MRN: [id number] Patient is status post debridement of abscess and osteomyelitis left iliac crest.  We will plan for repeat debridement tomorrow Wednesday with anticipated wound closure anticipate discharge on oral antibiotics in several days after surgery.

## 2021-02-11 ENCOUNTER — Encounter (HOSPITAL_COMMUNITY): Payer: Self-pay | Admitting: Orthopedic Surgery

## 2021-02-11 ENCOUNTER — Encounter (HOSPITAL_COMMUNITY)
Admission: RE | Disposition: A | Discharge status: Home Health | Payer: Self-pay | Source: Home / Self Care | Attending: Orthopedic Surgery

## 2021-02-11 ENCOUNTER — Inpatient Hospital Stay (HOSPITAL_COMMUNITY): Payer: Medicare Other | Admitting: Certified Registered"

## 2021-02-11 DIAGNOSIS — M86152 Other acute osteomyelitis, left femur: Secondary | ICD-10-CM | POA: Diagnosis not present

## 2021-02-11 HISTORY — PX: I & D EXTREMITY: SHX5045

## 2021-02-11 HISTORY — PX: APPLICATION OF WOUND VAC: SHX5189

## 2021-02-11 LAB — PROTIME-INR
INR: 1.9 — ABNORMAL HIGH (ref 0.8–1.2)
Prothrombin Time: 21.8 seconds — ABNORMAL HIGH (ref 11.4–15.2)

## 2021-02-11 SURGERY — IRRIGATION AND DEBRIDEMENT EXTREMITY
Anesthesia: General | Laterality: Left

## 2021-02-11 MED ORDER — DEXAMETHASONE SODIUM PHOSPHATE 10 MG/ML IJ SOLN
INTRAMUSCULAR | Status: DC | PRN
  Administered 2021-02-11: 5 mg via INTRAVENOUS

## 2021-02-11 MED ORDER — FENTANYL CITRATE (PF) 100 MCG/2ML IJ SOLN
INTRAMUSCULAR | Status: AC
  Filled 2021-02-11: qty 2

## 2021-02-11 MED ORDER — ACETAMINOPHEN 500 MG PO TABS
1000.0000 mg | ORAL_TABLET | Freq: Once | ORAL | Status: AC
  Administered 2021-02-11: 1000 mg via ORAL

## 2021-02-11 MED ORDER — PROPOFOL 10 MG/ML IV BOLUS
INTRAVENOUS | Status: DC | PRN
  Administered 2021-02-11: 130 mg via INTRAVENOUS

## 2021-02-11 MED ORDER — FENTANYL CITRATE (PF) 250 MCG/5ML IJ SOLN
INTRAMUSCULAR | Status: AC
  Filled 2021-02-11: qty 5

## 2021-02-11 MED ORDER — LIDOCAINE 2% (20 MG/ML) 5 ML SYRINGE
INTRAMUSCULAR | Status: DC | PRN
  Administered 2021-02-11: 60 mg via INTRAVENOUS

## 2021-02-11 MED ORDER — ONDANSETRON HCL 4 MG/2ML IJ SOLN
INTRAMUSCULAR | Status: DC | PRN
  Administered 2021-02-11: 4 mg via INTRAVENOUS

## 2021-02-11 MED ORDER — MIDAZOLAM HCL 2 MG/2ML IJ SOLN
INTRAMUSCULAR | Status: AC
  Filled 2021-02-11: qty 2

## 2021-02-11 MED ORDER — LACTATED RINGERS IV SOLN: INTRAVENOUS | Status: DC

## 2021-02-11 MED ORDER — ONDANSETRON HCL 4 MG/2ML IJ SOLN: 4.0000 mg | Freq: Once | INTRAMUSCULAR | Status: DC | PRN

## 2021-02-11 MED ORDER — ONDANSETRON HCL 4 MG/2ML IJ SOLN
INTRAMUSCULAR | Status: AC
  Filled 2021-02-11: qty 2

## 2021-02-11 MED ORDER — CHLORHEXIDINE GLUCONATE 0.12 % MT SOLN: 15.0000 mL | Freq: Once | OROMUCOSAL | Status: AC

## 2021-02-11 MED ORDER — PHENYLEPHRINE 40 MCG/ML (10ML) SYRINGE FOR IV PUSH (FOR BLOOD PRESSURE SUPPORT)
PREFILLED_SYRINGE | INTRAVENOUS | Status: DC | PRN
  Administered 2021-02-11: 40 ug via INTRAVENOUS
  Administered 2021-02-11: 80 ug via INTRAVENOUS

## 2021-02-11 MED ORDER — LIDOCAINE 2% (20 MG/ML) 5 ML SYRINGE
INTRAMUSCULAR | Status: AC
  Filled 2021-02-11: qty 5

## 2021-02-11 MED ORDER — OXYCODONE HCL 5 MG PO TABS
5.0000 mg | ORAL_TABLET | Freq: Once | ORAL | Status: AC | PRN
Start: 2021-02-11 — End: 2021-02-11
  Administered 2021-02-11: 5 mg via ORAL

## 2021-02-11 MED ORDER — SODIUM CHLORIDE 0.9 % IV SOLN: INTRAVENOUS | Status: DC

## 2021-02-11 MED ORDER — MIDAZOLAM HCL 5 MG/5ML IJ SOLN
INTRAMUSCULAR | Status: DC | PRN
  Administered 2021-02-11: 2 mg via INTRAVENOUS

## 2021-02-11 MED ORDER — DEXAMETHASONE SODIUM PHOSPHATE 10 MG/ML IJ SOLN
INTRAMUSCULAR | Status: AC
  Filled 2021-02-11: qty 1

## 2021-02-11 MED ORDER — CHLORHEXIDINE GLUCONATE 0.12 % MT SOLN
OROMUCOSAL | Status: AC
  Administered 2021-02-11: 15 mL via OROMUCOSAL
  Filled 2021-02-11: qty 15

## 2021-02-11 MED ORDER — PROPOFOL 10 MG/ML IV BOLUS
INTRAVENOUS | Status: AC
  Filled 2021-02-11: qty 20

## 2021-02-11 MED ORDER — WARFARIN SODIUM 7.5 MG PO TABS
7.5000 mg | ORAL_TABLET | Freq: Once | ORAL | Status: AC
  Administered 2021-02-11: 7.5 mg via ORAL
  Filled 2021-02-11: qty 1

## 2021-02-11 MED ORDER — OXYCODONE HCL 5 MG PO TABS
ORAL_TABLET | ORAL | Status: AC
  Filled 2021-02-11: qty 1

## 2021-02-11 MED ORDER — FENTANYL CITRATE (PF) 100 MCG/2ML IJ SOLN
25.0000 ug | INTRAMUSCULAR | Status: DC | PRN
  Administered 2021-02-11 (×2): 25 ug via INTRAVENOUS

## 2021-02-11 MED ORDER — OXYCODONE HCL 5 MG/5ML PO SOLN: 5.0000 mg | Freq: Once | ORAL | Status: AC | PRN

## 2021-02-11 MED ORDER — FENTANYL CITRATE (PF) 100 MCG/2ML IJ SOLN
INTRAMUSCULAR | Status: DC | PRN
  Administered 2021-02-11: 25 ug via INTRAVENOUS
  Administered 2021-02-11: 50 ug via INTRAVENOUS
  Administered 2021-02-11: 25 ug via INTRAVENOUS

## 2021-02-11 MED ORDER — ACETAMINOPHEN 500 MG PO TABS
ORAL_TABLET | ORAL | Status: AC
  Filled 2021-02-11: qty 2

## 2021-02-11 MED ORDER — ORAL CARE MOUTH RINSE: 15.0000 mL | Freq: Once | OROMUCOSAL | Status: AC

## 2021-02-11 MED ORDER — 0.9 % SODIUM CHLORIDE (POUR BTL) OPTIME
TOPICAL | Status: DC | PRN
  Administered 2021-02-11: 1000 mL

## 2021-02-11 SURGICAL SUPPLY — 39 items
BAG COUNTER SPONGE SURGICOUNT (BAG) IMPLANT
BLADE SURG 21 STRL SS (BLADE) ×3 IMPLANT
BNDG COHESIVE 6X5 TAN STRL LF (GAUZE/BANDAGES/DRESSINGS) IMPLANT
BNDG GAUZE ELAST 4 BULKY (GAUZE/BANDAGES/DRESSINGS) ×6 IMPLANT
CANISTER WOUND CARE 500ML ATS (WOUND CARE) ×1 IMPLANT
CASSETTE VERAFLO VERALINK (MISCELLANEOUS) IMPLANT
COVER SURGICAL LIGHT HANDLE (MISCELLANEOUS) ×6 IMPLANT
DERMABOND ADVANCED (GAUZE/BANDAGES/DRESSINGS) ×1
DERMABOND ADVANCED .7 DNX12 (GAUZE/BANDAGES/DRESSINGS) IMPLANT
DRAPE U-SHAPE 47X51 STRL (DRAPES) ×3 IMPLANT
DRESSING PREVENA PLUS CUSTOM (GAUZE/BANDAGES/DRESSINGS) IMPLANT
DRESSING VERAFLO CLEANS CC MED (GAUZE/BANDAGES/DRESSINGS) IMPLANT
DRSG ADAPTIC 3X8 NADH LF (GAUZE/BANDAGES/DRESSINGS) ×3 IMPLANT
DRSG PREVENA PLUS CUSTOM (GAUZE/BANDAGES/DRESSINGS) ×3
DRSG VERAFLO CLEANSE CC MED (GAUZE/BANDAGES/DRESSINGS) ×3
DURAPREP 26ML APPLICATOR (WOUND CARE) ×3 IMPLANT
ELECT REM PT RETURN 9FT ADLT (ELECTROSURGICAL)
ELECTRODE REM PT RTRN 9FT ADLT (ELECTROSURGICAL) IMPLANT
GAUZE SPONGE 4X4 12PLY STRL (GAUZE/BANDAGES/DRESSINGS) ×3 IMPLANT
GLOVE SURG ORTHO LTX SZ9 (GLOVE) ×3 IMPLANT
GLOVE SURG UNDER POLY LF SZ9 (GLOVE) ×3 IMPLANT
GOWN STRL REUS W/ TWL XL LVL3 (GOWN DISPOSABLE) ×4 IMPLANT
GOWN STRL REUS W/TWL XL LVL3 (GOWN DISPOSABLE) ×6
HANDPIECE INTERPULSE COAX TIP (DISPOSABLE)
KIT BASIN OR (CUSTOM PROCEDURE TRAY) ×3 IMPLANT
KIT TURNOVER KIT B (KITS) ×3 IMPLANT
MANIFOLD NEPTUNE II (INSTRUMENTS) ×3 IMPLANT
NS IRRIG 1000ML POUR BTL (IV SOLUTION) ×3 IMPLANT
PACK ORTHO EXTREMITY (CUSTOM PROCEDURE TRAY) ×3 IMPLANT
PAD ARMBOARD 7.5X6 YLW CONV (MISCELLANEOUS) ×6 IMPLANT
PAD NEG PRESSURE SENSATRAC (MISCELLANEOUS) ×1 IMPLANT
SET HNDPC FAN SPRY TIP SCT (DISPOSABLE) IMPLANT
STOCKINETTE IMPERVIOUS 9X36 MD (GAUZE/BANDAGES/DRESSINGS) IMPLANT
SUT ETHILON 2 0 PSLX (SUTURE) ×4 IMPLANT
SWAB COLLECTION DEVICE MRSA (MISCELLANEOUS) ×3 IMPLANT
SWAB CULTURE ESWAB REG 1ML (MISCELLANEOUS) IMPLANT
TOWEL GREEN STERILE (TOWEL DISPOSABLE) ×3 IMPLANT
TUBE CONNECTING 12X1/4 (SUCTIONS) ×3 IMPLANT
YANKAUER SUCT BULB TIP NO VENT (SUCTIONS) ×3 IMPLANT

## 2021-02-11 NOTE — Progress Notes (Signed)
PT Cancellation Note  Patient Details Name: [name] MRN: [id number] DOB: 1961/01/26   Cancelled Treatment:    Reason Eval/Treat Not Completed: Patient declined, no reason specified.   Zenaida Niece 02/11/2021, 4:54 PM

## 2021-02-11 NOTE — Op Note (Addendum)
02/11/2021  11:14 AM  PATIENT:  [name]    PRE-OPERATIVE DIAGNOSIS:  Abscess Left Hip  POST-OPERATIVE DIAGNOSIS:  Same  PROCEDURE:  REPEAT DEBRIDEMENT LEFT HIP ULCER, APPLICATION OF WOUND VAC Local tissue rearrangement for wound closure 15 x 7 cm  SURGEON:  [name], MD  PHYSICIAN ASSISTANT:None ANESTHESIA:   General  PREOPERATIVE INDICATIONS:  [name] is a  60 y.o. female with a diagnosis of Abscess Left Hip who failed conservative measures and elected for surgical management.    The risks benefits and alternatives were discussed with the patient preoperatively including but not limited to the risks of infection, bleeding, nerve injury, cardiopulmonary complications, the need for revision surgery, among others, and the patient was willing to proceed.  OPERATIVE IMPLANTS: Cleanse choice wound VAC sponge, blue.  @ENCIMAGES @  OPERATIVE FINDINGS: Large purulent abscess.  Tissue sent for cultures.  Patient on Maxipime.  OPERATIVE PROCEDURE: Patient was brought the operating room and underwent general anesthetic.  After adequate levels anesthesia were obtained patient's left hip was prepped using DuraPrep draped into a sterile field a timeout was called.  The wound cavity had a large amount of purulent drainage it covered all the surface area.  Elliptical incision was made around the surgical wound to debride back to healthy viable margins.  This left a wound that was 15 x 7 cm and 5 cm deep.  Electrocardio was used for hemostasis wound was irrigated with normal saline the wound margins were clear after surgical excisional debridement.  The wound was then packed with the cleanse choice blue sponge, local tissue rearrangement was used to close the wound over the sponge 15 x 7 cm.  The wound was covered with derma tack and connected to a Praveena suction.  This had a good suction fit with 2 checkmarks.  Patient was extubated taken the PACU in stable  condition.  Debridement type: Excisional Debridement  Side: left  Body Location: left hip, iliac crest   Tools used for debridement: scalpel, curette, and rongeur  Pre-debridement Wound size (cm):   Length: 7        Width: 5     Depth: 3   Post-debridement Wound size (cm):   Length: 15        Width: 7     Depth: 5   Debridement depth beyond dead/damaged tissue down to healthy viable tissue: yes  Tissue layer involved: skin, subcutaneous tissue, muscle / fascia  Nature of tissue removed: Slough, Necrotic, Devitalized Tissue, Non-viable tissue, and Purulence  Irrigation volume: 1 liter     Irrigation fluid type: Normal Saline     DISCHARGE PLANNING:  Antibiotic duration: Continue IV Maxipime  Weightbearing: Weightbearing as tolerated  Pain medication: Opioid pathway  Dressing care/ Wound VAC: Wound VAC  Ambulatory devices: Walker  Discharge to: Anticipate return to the operating room on Friday  Follow-up: In the office 1 week post operative.

## 2021-02-11 NOTE — Anesthesia Postprocedure Evaluation (Signed)
Anesthesia Post Note  Patient: [name]  Procedure(s) Performed: REPEAT DEBRIDEMENT LEFT HIP ULCER (Left) APPLICATION OF WOUND VAC     Patient location during evaluation: PACU Anesthesia Type: General Level of consciousness: awake and alert and oriented Pain management: pain level controlled Vital Signs Assessment: post-procedure vital signs reviewed and stable Respiratory status: spontaneous breathing, nonlabored ventilation and respiratory function stable Cardiovascular status: blood pressure returned to baseline and stable Postop Assessment: no apparent nausea or vomiting Anesthetic complications: no   No notable events documented.  Last Vitals:  Vitals:   02/11/21 1136 02/11/21 1151  BP: 109/66 107/73  Pulse: (!) 117 (!) 107  Resp: 11 10  Temp:  (!) 36.3 C  SpO2: 92% 94%    Last Pain:  Vitals:   02/11/21 1202  TempSrc:   PainSc: 2                  Arty Lantzy A.

## 2021-02-11 NOTE — Progress Notes (Signed)
Attempted to see pt for OT treatment. Pt debridement procedure. Will attempt back as schedule allows.  Jinger Neighbors, [place] [phone]

## 2021-02-11 NOTE — Anesthesia Preprocedure Evaluation (Signed)
Anesthesia Evaluation  Patient identified by MRN, date of birth, ID band Patient awake    Reviewed: Allergy & Precautions, NPO status , Patient's Chart, lab work & pertinent test results  Airway Mallampati: III  TM Distance: >3 FB Neck ROM: Full    Dental  (+) Edentulous Upper, Missing, Dental Advisory Given   Pulmonary shortness of breath and with exertion, asthma , sleep apnea , COPD, former smoker,    Pulmonary exam normal        Cardiovascular hypertension, Pt. on medications and Pt. on home beta blockers +CHF  Normal cardiovascular exam+ dysrhythmias Atrial Fibrillation + Valvular Problems/Murmurs  Rhythm:Irregular Rate:Normal  Anticoagulated for mechanical valves. On coumadin.  S/P AVR and MVR  2021 echo:  1. Left ventricular ejection fraction, by estimation, is 55 to 60%. The left ventricle has normal function. The left ventricle has no regional wall motion abnormalities. Left ventricular diastolic function could not be evaluated.  2. Right ventricular systolic function is normal. The right ventricular size is mildly enlarged. There is mildly elevated pulmonary artery systolic pressure. The estimated right ventricular systolic pressure is  01.6 mmHg.  3. Left atrial size was mildly dilated.  4. 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 Mean gradient 10.7 mmHG at  67 bpm. EOA 1.35 cm2 (0.58 cm/m2). P 1/2 93 msec. Peak E 2.3 m/s. Overall, likely severe patient-prosthesis mismatch is present. . The mitral valve has been repaired/replaced. No evidence of mitral valve regurgitation. There is a 25 mm mechanical valve  present in the mitral position. Procedure Date: 08/06/2014.  5. 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 Vmax 3.1 m/s, mean gradient 20 mmHG, DI 0.48, EOA 1.67 cm2 (0.72 cm/m2). Suspect moderate patient-prosthesis mismatch. Difficult to determine if there is paravalvular leak as reported on prior echocardiograms,  TEE recommended to clarify. The aortic valve has been  repaired/replaced. Aortic valve regurgitation is not visualized. There is  a 19 mm mechanical valve present in the aortic position. Procedure Date: 08/06/2014.  6. The inferior vena cava is normal in size with greater than 50% respiratory variability, suggesting right atrial pressure of 3 mmHg.   Comparison(s): A prior study was performed on 01/19/2019. No significant change from prior study. Prior images reviewed side by side. Gradients across both prostheses are stable.    Neuro/Psych PSYCHIATRIC DISORDERS Anxiety Depression  Neuromuscular disease (Bell's palsy) negative neurological ROS  negative psych ROS   GI/Hepatic negative GI ROS, Neg liver ROS, hiatal hernia, GERD  Medicated and Controlled,Gastric weight loss surgery in 2019   Endo/Other  Morbid obesityHyperlipidemia Gout  Renal/GU Renal InsufficiencyRenal diseasenegative Renal ROS  negative genitourinary   Musculoskeletal  (+) Arthritis , Osteoarthritis,  Open wound left hip   Abdominal (+) + obese,   Peds negative pediatric ROS (+)  Hematology  (+) anemia , INR 1.9 this am   Anesthesia Other Findings   Reproductive/Obstetrics negative OB ROS                             Anesthesia Physical  Anesthesia Plan  ASA: 3  Anesthesia Plan: General   Post-op Pain Management:    Induction: Intravenous  PONV Risk Score and Plan: 3 and Treatment may vary due to age or medical condition, Midazolam, Ondansetron and Dexamethasone  Airway Management Planned: LMA  Additional Equipment: None  Intra-op Plan:   Post-operative Plan: Extubation in OR  Informed Consent: I have reviewed the patients History and Physical, chart, labs and  discussed the procedure including the risks, benefits and alternatives for the proposed anesthesia with the patient or authorized representative who has indicated his/her understanding and acceptance.     Dental  advisory given  Plan Discussed with:   Anesthesia Plan Comments:         Anesthesia Quick Evaluation

## 2021-02-11 NOTE — Interval H&P Note (Signed)
History and Physical Interval Note:  02/11/2021 6:39 AM  [name]  has presented today for surgery, with the diagnosis of Abscess Left Hip.  The various methods of treatment have been discussed with the patient and family. After consideration of risks, benefits and other options for treatment, the patient has consented to  Procedure(s): REPEAT DEBRIDEMENT LEFT HIP ULCER AND APPLY VAC (Left) as a surgical intervention.  The patient's history has been reviewed, patient examined, no change in status, stable for surgery.  I have reviewed the patient's chart and labs.  Questions were answered to the patient's satisfaction.     [name]

## 2021-02-11 NOTE — Progress Notes (Signed)
ANTICOAGULATION CONSULT NOTE - Follow Up Consult  Pharmacy Consult for Warfarin Indication: Mechanical Valve / Afib  No Known Allergies  Patient Measurements: Height: 5' 6.5" (168.9 cm) Weight: 127 kg (279 lb 15.8 oz) IBW/kg (Calculated) : 60.45   Vital Signs: Temp: 97.4 F (36.3 C) (09/14 1151) Temp Source: Oral (09/14 0912) BP: 107/73 (09/14 1151) Pulse Rate: 107 (09/14 1151)  Labs: Recent Labs    02/09/21 0336 02/09/21 1258 02/10/21 0052 02/11/21 0321  LABPROT 25.4*  --  23.4* 21.8*  INR 2.3*  --  2.1* 1.9*  CREATININE  --  1.32*  --   --     Estimated Creatinine Clearance: 61.5 mL/min (A) (by C-G formula based on SCr of 1.32 mg/dL (H)).  Assessment: Resuming warfarin s/p surgery for mechanical valve INR today 1.9 Dose prior to admission 3.75 mg daily except 7.5 mg po MWF  Goal of Therapy:  INR 2.5 to 3.5 Monitor platelets by anticoagulation protocol: Yes   Plan:  Warfarin 7.5 mg po x 1 dose Daily INR   Tad Moore 02/11/2021,12:25 PM

## 2021-02-11 NOTE — Anesthesia Procedure Notes (Signed)
Procedure Name: LMA Insertion Date/Time: 02/11/2021 10:18 AM Performed by: Gwyndolyn Saxon, CRNA Pre-anesthesia Checklist: Patient identified, Emergency Drugs available, Suction available and Patient being monitored Patient Re-evaluated:Patient Re-evaluated prior to induction Oxygen Delivery Method: Circle system utilized Preoxygenation: Pre-oxygenation with 100% oxygen Induction Type: IV induction Ventilation: Mask ventilation without difficulty LMA: LMA inserted LMA Size: 4.0 Number of attempts: 1 Placement Confirmation: positive ETCO2 and breath sounds checked- equal and bilateral Tube secured with: Tape Dental Injury: Teeth and Oropharynx as per pre-operative assessment

## 2021-02-11 NOTE — Transfer of Care (Signed)
Immediate Anesthesia Transfer of Care Note  Patient: ADELI FROST  Procedure(s) Performed: REPEAT DEBRIDEMENT LEFT HIP ULCER (Left) APPLICATION OF WOUND VAC  Patient Location: PACU  Anesthesia Type:General  Level of Consciousness: drowsy and patient cooperative  Airway & Oxygen Therapy: Patient Spontanous Breathing and Patient connected to nasal cannula oxygen  Post-op Assessment: Report given to RN and Post -op Vital signs reviewed and stable  Post vital signs: Reviewed and stable  Last Vitals:  Vitals Value Taken Time  BP 110/73 02/11/21 1106  Temp 36.2 C 02/11/21 1106  Pulse 107 02/11/21 1113  Resp 10 02/11/21 1113  SpO2 100 % 02/11/21 1113  Vitals shown include unvalidated device data.  Last Pain:  Vitals:   02/11/21 0923  TempSrc:   PainSc: 0-No pain      Patients Stated Pain Goal: 0 (44/01/02 7253)  Complications: No notable events documented.

## 2021-02-12 ENCOUNTER — Encounter (HOSPITAL_COMMUNITY): Payer: Self-pay | Admitting: Orthopedic Surgery

## 2021-02-12 ENCOUNTER — Other Ambulatory Visit: Payer: Self-pay | Admitting: Physician Assistant

## 2021-02-12 ENCOUNTER — Telehealth: Payer: Self-pay | Admitting: Orthopedic Surgery

## 2021-02-12 LAB — PROTIME-INR
INR: 1.9 — ABNORMAL HIGH (ref 0.8–1.2)
Prothrombin Time: 22.2 seconds — ABNORMAL HIGH (ref 11.4–15.2)

## 2021-02-12 MED ORDER — WARFARIN SODIUM 7.5 MG PO TABS
7.5000 mg | ORAL_TABLET | Freq: Once | ORAL | Status: DC
  Filled 2021-02-12: qty 1

## 2021-02-12 MED ORDER — WARFARIN - PHARMACIST DOSING INPATIENT
Freq: Every day | Status: DC
Start: 2021-02-12 — End: 2021-02-16

## 2021-02-12 NOTE — Progress Notes (Signed)
Patient ID: [name], female   DOB: 09-21-1960, 60 y.o.   MRN: [id number] Patient is postoperative day 1 repeat debridement left iliac crest infection.  Intraoperatively patient had a significant purulent abscess.  Approximately 1 cm margins were obtained to resect back to healthy viable tissue.  The cleanse choice sponge was placed this has a good suction fit occasionally clogging but with massage of the tube this resolves.  Plan for return to the operating room on Friday.  New cultures were obtained from the soft tissue.

## 2021-02-12 NOTE — Progress Notes (Signed)
ANTICOAGULATION CONSULT NOTE - Follow Up Consult  Pharmacy Consult for Warfarin Indication: Mechanical Valve / Afib  No Known Allergies  Patient Measurements: Height: 5' 6.5" (168.9 cm) Weight: 127 kg (279 lb 15.8 oz) IBW/kg (Calculated) : 60.45   Vital Signs: Temp: 98.1 F (36.7 C) (09/15 0911) Temp Source: Oral (09/15 0911) BP: 103/57 (09/15 0911) Pulse Rate: 65 (09/15 0911)  Labs: Recent Labs    02/09/21 1258 02/10/21 0052 02/11/21 0321 02/12/21 0510  LABPROT  --  23.4* 21.8* 22.2*  INR  --  2.1* 1.9* 1.9*  CREATININE 1.32*  --   --   --      Estimated Creatinine Clearance: 61.5 mL/min (A) (by C-G formula based on SCr of 1.32 mg/dL (H)).  Assessment: Resuming warfarin s/p wound surgery for mechanical valve May return to OR Friday INR today 1.9 Dose prior to admission 3.75 mg daily except 7.5 mg po MWF  Goal of Therapy:  INR 2.5 to 3.5 Monitor platelets by anticoagulation protocol: Yes   Plan:  Repeat Warfarin 7.5 mg po x 1 dose Daily INR  Thank you Anette Guarneri, PharmD  02/12/2021,9:21 AM

## 2021-02-12 NOTE — Telephone Encounter (Signed)
I called and sw Tameka and advised that per Dr. Sharol Given please hold pt's coumadin today. Surgery scheduled for tomorrow.

## 2021-02-12 NOTE — Progress Notes (Signed)
PT Cancellation Note  Patient Details Name: [name] MRN: [id number] DOB: 12/11/1960   Cancelled Treatment:    Reason Eval/Treat Not Completed: Other (comment).  Pt declined, reporting she had had line issues that were making her concerned, and wanted to wait until tomorrow's procedure.  Follow up as time and pt allow.   Ramond Dial 02/12/2021, 4:03 PM  Mee Hives, PT MS Acute Rehab Dept. Number: [place] [facility] [place]

## 2021-02-12 NOTE — Telephone Encounter (Signed)
Tameka called. She is the nurse working with patient in the hospital. She would like to know if she should still give the coumadin? Her INR is 1.9. her number is [phone]

## 2021-02-13 ENCOUNTER — Encounter (HOSPITAL_COMMUNITY): Payer: Self-pay | Admitting: Orthopedic Surgery

## 2021-02-13 ENCOUNTER — Ambulatory Visit (HOSPITAL_BASED_OUTPATIENT_CLINIC_OR_DEPARTMENT_OTHER): Payer: Medicare Other | Admitting: Family

## 2021-02-13 ENCOUNTER — Inpatient Hospital Stay (HOSPITAL_COMMUNITY): Payer: Medicare Other | Admitting: Anesthesiology

## 2021-02-13 ENCOUNTER — Encounter (HOSPITAL_COMMUNITY)
Admission: RE | Disposition: A | Discharge status: Home Health | Payer: Self-pay | Source: Home / Self Care | Attending: Orthopedic Surgery

## 2021-02-13 DIAGNOSIS — M86152 Other acute osteomyelitis, left femur: Secondary | ICD-10-CM | POA: Diagnosis not present

## 2021-02-13 HISTORY — PX: APPLICATION OF WOUND VAC: SHX5189

## 2021-02-13 HISTORY — PX: I & D EXTREMITY: SHX5045

## 2021-02-13 LAB — PROTIME-INR
INR: 2.3 — ABNORMAL HIGH (ref 0.8–1.2)
Prothrombin Time: 25.6 seconds — ABNORMAL HIGH (ref 11.4–15.2)

## 2021-02-13 SURGERY — IRRIGATION AND DEBRIDEMENT EXTREMITY
Anesthesia: General | Laterality: Left

## 2021-02-13 MED ORDER — OXYCODONE HCL 5 MG PO TABS: 5.0000 mg | ORAL_TABLET | Freq: Once | ORAL | Status: DC | PRN

## 2021-02-13 MED ORDER — PROPOFOL 10 MG/ML IV BOLUS
INTRAVENOUS | Status: DC | PRN
  Administered 2021-02-13: 100 mg via INTRAVENOUS

## 2021-02-13 MED ORDER — LIDOCAINE 2% (20 MG/ML) 5 ML SYRINGE
INTRAMUSCULAR | Status: AC
  Filled 2021-02-13: qty 5

## 2021-02-13 MED ORDER — WARFARIN SODIUM 7.5 MG PO TABS
7.5000 mg | ORAL_TABLET | Freq: Once | ORAL | Status: DC
  Filled 2021-02-13: qty 1

## 2021-02-13 MED ORDER — PROPOFOL 10 MG/ML IV BOLUS
INTRAVENOUS | Status: AC
  Filled 2021-02-13: qty 20

## 2021-02-13 MED ORDER — MIDAZOLAM HCL 2 MG/2ML IJ SOLN
INTRAMUSCULAR | Status: AC
  Filled 2021-02-13: qty 2

## 2021-02-13 MED ORDER — VANCOMYCIN HCL 1000 MG IV SOLR
INTRAVENOUS | Status: AC
  Filled 2021-02-13: qty 20

## 2021-02-13 MED ORDER — GENTAMICIN SULFATE 40 MG/ML IJ SOLN
INTRAMUSCULAR | Status: DC | PRN
  Administered 2021-02-13 (×3): 80 mg

## 2021-02-13 MED ORDER — VANCOMYCIN HCL 1000 MG IV SOLR
INTRAVENOUS | Status: DC | PRN
  Administered 2021-02-13: 1000 mg

## 2021-02-13 MED ORDER — EPHEDRINE SULFATE-NACL 50-0.9 MG/10ML-% IV SOSY
PREFILLED_SYRINGE | INTRAVENOUS | Status: DC | PRN
  Administered 2021-02-13 (×2): 5 mg via INTRAVENOUS

## 2021-02-13 MED ORDER — PHENYLEPHRINE 40 MCG/ML (10ML) SYRINGE FOR IV PUSH (FOR BLOOD PRESSURE SUPPORT)
PREFILLED_SYRINGE | INTRAVENOUS | Status: AC
  Filled 2021-02-13: qty 20

## 2021-02-13 MED ORDER — 0.9 % SODIUM CHLORIDE (POUR BTL) OPTIME
TOPICAL | Status: DC | PRN
  Administered 2021-02-13 (×2): 1000 mL

## 2021-02-13 MED ORDER — ACETAMINOPHEN 325 MG PO TABS: 325.0000 mg | ORAL_TABLET | ORAL | Status: DC | PRN

## 2021-02-13 MED ORDER — ACETAMINOPHEN 160 MG/5ML PO SOLN: 325.0000 mg | ORAL | Status: DC | PRN

## 2021-02-13 MED ORDER — FENTANYL CITRATE (PF) 100 MCG/2ML IJ SOLN
INTRAMUSCULAR | Status: DC | PRN
  Administered 2021-02-13: 100 ug via INTRAVENOUS

## 2021-02-13 MED ORDER — CHLORHEXIDINE GLUCONATE 0.12 % MT SOLN
15.0000 mL | Freq: Once | OROMUCOSAL | Status: AC
  Administered 2021-02-13: 15 mL via OROMUCOSAL

## 2021-02-13 MED ORDER — GENTAMICIN SULFATE 40 MG/ML IJ SOLN
INTRAMUSCULAR | Status: AC
  Filled 2021-02-13: qty 6

## 2021-02-13 MED ORDER — LACTATED RINGERS IV SOLN: INTRAVENOUS | Status: DC

## 2021-02-13 MED ORDER — PHENYLEPHRINE 40 MCG/ML (10ML) SYRINGE FOR IV PUSH (FOR BLOOD PRESSURE SUPPORT)
PREFILLED_SYRINGE | INTRAVENOUS | Status: DC | PRN
  Administered 2021-02-13 (×10): 100 ug via INTRAVENOUS

## 2021-02-13 MED ORDER — FENTANYL CITRATE (PF) 100 MCG/2ML IJ SOLN
INTRAMUSCULAR | Status: AC
  Filled 2021-02-13: qty 2

## 2021-02-13 MED ORDER — MEPERIDINE HCL 25 MG/ML IJ SOLN: 6.2500 mg | INTRAMUSCULAR | Status: DC | PRN

## 2021-02-13 MED ORDER — ONDANSETRON HCL 4 MG/2ML IJ SOLN
INTRAMUSCULAR | Status: AC
  Filled 2021-02-13: qty 2

## 2021-02-13 MED ORDER — MIDAZOLAM HCL 5 MG/5ML IJ SOLN
INTRAMUSCULAR | Status: DC | PRN
  Administered 2021-02-13: 2 mg via INTRAVENOUS

## 2021-02-13 MED ORDER — EPHEDRINE 5 MG/ML INJ
INTRAVENOUS | Status: AC
  Filled 2021-02-13: qty 5

## 2021-02-13 MED ORDER — SODIUM CHLORIDE 0.9 % IR SOLN
Status: DC | PRN
  Administered 2021-02-13: 3000 mL

## 2021-02-13 MED ORDER — ONDANSETRON HCL 4 MG/2ML IJ SOLN: 4.0000 mg | Freq: Once | INTRAMUSCULAR | Status: DC | PRN

## 2021-02-13 MED ORDER — ORAL CARE MOUTH RINSE: 15.0000 mL | Freq: Once | OROMUCOSAL | Status: AC

## 2021-02-13 MED ORDER — LIDOCAINE 2% (20 MG/ML) 5 ML SYRINGE
INTRAMUSCULAR | Status: DC | PRN
  Administered 2021-02-13: 80 mg via INTRAVENOUS

## 2021-02-13 MED ORDER — SODIUM CHLORIDE 0.9 % IV SOLN
INTRAVENOUS | Status: DC
Start: 2021-02-13 — End: 2021-02-19

## 2021-02-13 MED ORDER — OXYCODONE HCL 5 MG/5ML PO SOLN
5.0000 mg | Freq: Once | ORAL | Status: DC | PRN
Start: 2021-02-13 — End: 2021-02-13

## 2021-02-13 MED ORDER — FENTANYL CITRATE (PF) 250 MCG/5ML IJ SOLN
INTRAMUSCULAR | Status: AC
  Filled 2021-02-13: qty 5

## 2021-02-13 MED ORDER — ONDANSETRON HCL 4 MG/2ML IJ SOLN
INTRAMUSCULAR | Status: DC | PRN
  Administered 2021-02-13: 4 mg via INTRAVENOUS

## 2021-02-13 MED ORDER — FENTANYL CITRATE (PF) 100 MCG/2ML IJ SOLN
25.0000 ug | INTRAMUSCULAR | Status: DC | PRN
  Administered 2021-02-13: 50 ug via INTRAVENOUS
  Administered 2021-02-13: 25 ug via INTRAVENOUS
  Administered 2021-02-13: 50 ug via INTRAVENOUS

## 2021-02-13 SURGICAL SUPPLY — 39 items
BAG COUNTER SPONGE SURGICOUNT (BAG) IMPLANT
BAG SURGICOUNT SPONGE COUNTING (BAG)
BLADE SURG 21 STRL SS (BLADE) ×4 IMPLANT
BNDG COHESIVE 6X5 TAN STRL LF (GAUZE/BANDAGES/DRESSINGS) IMPLANT
BNDG GAUZE ELAST 4 BULKY (GAUZE/BANDAGES/DRESSINGS) ×4 IMPLANT
CANISTER WOUND CARE 500ML ATS (WOUND CARE) ×4 IMPLANT
COVER SURGICAL LIGHT HANDLE (MISCELLANEOUS) ×8 IMPLANT
DERMABOND ADVANCED (GAUZE/BANDAGES/DRESSINGS) ×6
DERMABOND ADVANCED .7 DNX12 (GAUZE/BANDAGES/DRESSINGS) ×6 IMPLANT
DRAPE U-SHAPE 47X51 STRL (DRAPES) ×4 IMPLANT
DRESSING VERAFLO CLEANS CC MED (GAUZE/BANDAGES/DRESSINGS) ×4 IMPLANT
DRSG ADAPTIC 3X8 NADH LF (GAUZE/BANDAGES/DRESSINGS) IMPLANT
DRSG VERAFLO CLEANSE CC MED (GAUZE/BANDAGES/DRESSINGS) ×8
DURAPREP 26ML APPLICATOR (WOUND CARE) ×4 IMPLANT
ELECT REM PT RETURN 9FT ADLT (ELECTROSURGICAL)
ELECTRODE REM PT RTRN 9FT ADLT (ELECTROSURGICAL) IMPLANT
GAUZE SPONGE 4X4 12PLY STRL (GAUZE/BANDAGES/DRESSINGS) IMPLANT
GLOVE SURG ORTHO LTX SZ9 (GLOVE) ×4 IMPLANT
GLOVE SURG UNDER POLY LF SZ9 (GLOVE) ×4 IMPLANT
GOWN STRL REUS W/ TWL XL LVL3 (GOWN DISPOSABLE) ×4 IMPLANT
GOWN STRL REUS W/TWL XL LVL3 (GOWN DISPOSABLE) ×8
HANDPIECE INTERPULSE COAX TIP (DISPOSABLE) ×4
KIT BASIN OR (CUSTOM PROCEDURE TRAY) ×4 IMPLANT
KIT STIMULAN  10CC (Orthopedic Implant) ×4 IMPLANT
KIT STIMULAN 10CC (Orthopedic Implant) ×2 IMPLANT
KIT TURNOVER KIT B (KITS) ×4 IMPLANT
MANIFOLD NEPTUNE II (INSTRUMENTS) ×4 IMPLANT
NS IRRIG 1000ML POUR BTL (IV SOLUTION) ×4 IMPLANT
PACK ORTHO EXTREMITY (CUSTOM PROCEDURE TRAY) ×4 IMPLANT
PAD ARMBOARD 7.5X6 YLW CONV (MISCELLANEOUS) ×8 IMPLANT
SET HNDPC FAN SPRY TIP SCT (DISPOSABLE) ×2 IMPLANT
STOCKINETTE IMPERVIOUS 9X36 MD (GAUZE/BANDAGES/DRESSINGS) IMPLANT
SUT ETHILON 2 0 PSLX (SUTURE) ×8 IMPLANT
SWAB COLLECTION DEVICE MRSA (MISCELLANEOUS) ×4 IMPLANT
SWAB CULTURE ESWAB REG 1ML (MISCELLANEOUS) IMPLANT
TOWEL GREEN STERILE (TOWEL DISPOSABLE) ×4 IMPLANT
TUBE CONNECTING 12'X1/4 (SUCTIONS) ×1
TUBE CONNECTING 12X1/4 (SUCTIONS) ×3 IMPLANT
YANKAUER SUCT BULB TIP NO VENT (SUCTIONS) ×4 IMPLANT

## 2021-02-13 NOTE — Progress Notes (Signed)
Pt post-op, went to the bsc and back to bed when the patient became nausea, gave pt trash can as requested and patient started to tilt forward towards the floor. The patient was unresponsive for a few mintues and came to. Patient was independent prior to procedure, at the time did not want to use the bedpan. No meds was given at the time. Dr. Sharol Given was notified, no new orders at this time. Normal saline hung at 75 ml/hr per post-op orders.

## 2021-02-13 NOTE — Interval H&P Note (Signed)
History and Physical Interval Note:  02/13/2021 7:08 AM  [name]  has presented today for surgery, with the diagnosis of Abscess Left Hip.  The various methods of treatment have been discussed with the patient and family. After consideration of risks, benefits and other options for treatment, the patient has consented to  Procedure(s): REPEAT DEBRIDEMENT LEFT HIP ULCER APPLY VAC (Left) as a surgical intervention.  The patient's history has been reviewed, patient examined, no change in status, stable for surgery.  I have reviewed the patient's chart and labs.  Questions were answered to the patient's satisfaction.     [name]

## 2021-02-13 NOTE — Progress Notes (Addendum)
   02/13/21 1900  What Happened  Was fall witnessed? Yes  Who witnessed fall? Silvestre Gunner LPN  Patients activity before fall other (comment) (from the bsc)  Point of contact hip/leg  Was patient injured? No  Follow Up  MD notified Dr. Meridee Score  Time MD notified [phone]  Family notified Yes - comment (sister Pam at bedside)  Time family notified 1845  Additional tests No  Progress note created (see row info) Yes  Adult Fall Risk Assessment  Risk Factor Category (scoring not indicated) Fall has occurred during this admission (document High fall risk)  Age 60  Fall History: Fall within 6 months prior to admission 5  Elimination; Bowel and/or Urine Incontinence 0  Elimination; Bowel and/or Urine Urgency/Frequency 0  Medications: includes PCA/Opiates, Anti-convulsants, Anti-hypertensives, Diuretics, Hypnotics, Laxatives, Sedatives, and Psychotropics 7  Patient Care Equipment 1  Mobility-Assistance 2  Mobility-Gait 0  Mobility-Sensory Deficit 0  Altered awareness of immediate physical environment 0  Impulsiveness 0  Lack of understanding of one's physical/cognitive limitations 0  Total Score 16  Patient Fall Risk Level High fall risk  Adult Fall Risk Interventions  Required Bundle Interventions *See Row Information* High fall risk - low, moderate, and high requirements implemented  Additional Interventions Use of appropriate toileting equipment (bedpan, BSC, etc.)  Screening for Fall Injury Risk (To be completed on HIGH fall risk patients) - Assessing Need for Floor Mats  Risk For Fall Injury- Criteria for Floor Mats Admitted as a result of a fall  Will Implement Floor Mats Yes

## 2021-02-13 NOTE — Anesthesia Preprocedure Evaluation (Addendum)
Anesthesia Evaluation  Patient identified by MRN, date of birth, ID band Patient awake    Reviewed: Allergy & Precautions, H&P , NPO status , Patient's Chart, lab work & pertinent test results  Airway Mallampati: III  TM Distance: >3 FB Neck ROM: Full    Dental  (+) Edentulous Upper, Missing, Dental Advisory Given   Pulmonary shortness of breath and with exertion, asthma , sleep apnea , COPD, former smoker,    Pulmonary exam normal breath sounds clear to auscultation       Cardiovascular hypertension, Pt. on medications and Pt. on home beta blockers +CHF  Normal cardiovascular exam+ dysrhythmias Atrial Fibrillation + Valvular Problems/Murmurs  Rhythm:Irregular Rate:Normal  Anticoagulated for mechanical valves. On coumadin.  S/P AVR and MVR  2021 echo:  1. Left ventricular ejection fraction, by estimation, is 55 to 60%. The left ventricle has normal function. The left ventricle has no regional wall motion abnormalities. Left ventricular diastolic function could not be evaluated.  2. Right ventricular systolic function is normal. The right ventricular size is mildly enlarged. There is mildly elevated pulmonary artery systolic pressure. The estimated right ventricular systolic pressure is  92.1 mmHg.  3. Left atrial size was mildly dilated.  4. 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 Mean gradient 10.7 mmHG at  67 bpm. EOA 1.35 cm2 (0.58 cm/m2). P 1/2 93 msec. Peak E 2.3 m/s. Overall, likely severe patient-prosthesis mismatch is present. . The mitral valve has been repaired/replaced. No evidence of mitral valve regurgitation. There is a 25 mm mechanical valve  present in the mitral position. Procedure Date: 08/06/2014.  5. 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 Vmax 3.1 m/s, mean gradient 20 mmHG, DI 0.48, EOA 1.67 cm2 (0.72 cm/m2). Suspect moderate patient-prosthesis mismatch. Difficult to determine if there is paravalvular leak  as reported on prior echocardiograms, TEE recommended to clarify. The aortic valve has been  repaired/replaced. Aortic valve regurgitation is not visualized. There is  a 19 mm mechanical valve present in the aortic position. Procedure Date: 08/06/2014.  6. The inferior vena cava is normal in size with greater than 50% respiratory variability, suggesting right atrial pressure of 3 mmHg.   Comparison(s): A prior study was performed on 01/19/2019. No significant change from prior study. Prior images reviewed side by side. Gradients across both prostheses are stable.    Neuro/Psych PSYCHIATRIC DISORDERS Anxiety Depression  Neuromuscular disease (Bell's palsy)    GI/Hepatic Neg liver ROS, hiatal hernia, GERD  Medicated and Controlled,Gastric weight loss surgery in 2019   Endo/Other  Morbid obesityHyperlipidemia Gout  Renal/GU Renal InsufficiencyRenal disease  negative genitourinary   Musculoskeletal  (+) Arthritis , Osteoarthritis,  Open wound left hip   Abdominal (+) + obese,   Peds negative pediatric ROS (+)  Hematology  (+) Blood dyscrasia, anemia , INR 1.9 this am   Anesthesia Other Findings   Reproductive/Obstetrics negative OB ROS                            Anesthesia Physical  Anesthesia Plan  ASA: 3  Anesthesia Plan: General   Post-op Pain Management:    Induction: Intravenous  PONV Risk Score and Plan: 3 and Treatment may vary due to age or medical condition, Midazolam, Ondansetron and Dexamethasone  Airway Management Planned: LMA  Additional Equipment: None  Intra-op Plan:   Post-operative Plan: Extubation in OR  Informed Consent: I have reviewed the patients History and Physical, chart, labs and discussed the procedure including  the risks, benefits and alternatives for the proposed anesthesia with the patient or authorized representative who has indicated his/her understanding and acceptance.     Dental advisory given  Plan  Discussed with: Anesthesiologist  Anesthesia Plan Comments: ( )        Anesthesia Quick Evaluation

## 2021-02-13 NOTE — Progress Notes (Signed)
   02/13/21 1900  What Happened  Was fall witnessed? Yes  Who witnessed fall? Silvestre Gunner LPN  Patients activity before fall other (comment) (from the bsc)  Point of contact hip/leg  Was patient injured? No  Follow Up  MD notified Dr. Meridee Score  Time MD notified [phone]  Family notified Yes - comment (sister Pam at bedside)  Time family notified 1845  Additional tests No  Progress note created (see row info) Yes  Adult Fall Risk Assessment  Risk Factor Category (scoring not indicated) Fall has occurred during this admission (document High fall risk)  Age 60  Fall History: Fall within 6 months prior to admission 5  Elimination; Bowel and/or Urine Incontinence 0  Elimination; Bowel and/or Urine Urgency/Frequency 0  Medications: includes PCA/Opiates, Anti-convulsants, Anti-hypertensives, Diuretics, Hypnotics, Laxatives, Sedatives, and Psychotropics 7  Patient Care Equipment 1  Mobility-Assistance 2  Mobility-Gait 0  Mobility-Sensory Deficit 0  Altered awareness of immediate physical environment 0  Impulsiveness 0  Lack of understanding of one's physical/cognitive limitations 0  Total Score 16  Patient Fall Risk Level High fall risk  Adult Fall Risk Interventions  Required Bundle Interventions *See Row Information* High fall risk - low, moderate, and high requirements implemented  Additional Interventions Use of appropriate toileting equipment (bedpan, BSC, etc.)  Screening for Fall Injury Risk (To be completed on HIGH fall risk patients) - Assessing Need for Floor Mats  Risk For Fall Injury- Criteria for Floor Mats Admitted as a result of a fall  Will Implement Floor Mats Yes  Pain Assessment  Pain Scale 0-10  Pain Score 0  Neurological  Neuro (WDL) WDL  Level of Consciousness Alert  Orientation Level Oriented X4  Cognition Follows commands  Musculoskeletal  Musculoskeletal (WDL) X  Assistive Device BSC  Generalized Weakness Yes  Integumentary  Integumentary (WDL) X   Skin Color Appropriate for ethnicity  Skin Condition Dry  Skin Integrity Surgical Incision (see LDA)  Pain Assessment  Result of Injury No

## 2021-02-13 NOTE — Anesthesia Postprocedure Evaluation (Signed)
Anesthesia Post Note  Patient: [name]  Procedure(s) Performed: REPEAT DEBRIDEMENT LEFT HIP ULCER (Left) APPLICATION OF WOUND VAC     Patient location during evaluation: PACU Anesthesia Type: General Level of consciousness: awake and alert Pain management: pain level controlled Vital Signs Assessment: post-procedure vital signs reviewed and stable Respiratory status: spontaneous breathing, nonlabored ventilation, respiratory function stable and patient connected to nasal cannula oxygen Cardiovascular status: blood pressure returned to baseline and stable Postop Assessment: no apparent nausea or vomiting Anesthetic complications: no   No notable events documented.  Last Vitals:  Vitals:   02/13/21 1851 02/13/21 2116  BP: 95/69 (!) 102/47  Pulse: 75 98  Resp: 18 16  Temp: (!) 36.4 C 36.6 C  SpO2: 92%     Last Pain:  Vitals:   02/13/21 2116  TempSrc: Oral  PainSc:                  Catalina Gravel

## 2021-02-13 NOTE — Progress Notes (Signed)
Occupational Therapy Treatment Patient Details Name: [name] MRN: [id number] DOB: September 12, 1960 Today's Date: 02/13/2021   History of present illness 60 y.o. 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 Due to the pt's Coumadin she has had continual increased drainage from the wound and currently has a large amount of purulent drainage. Repeat debridement L hip ulcer 9/14, planned 9/16. 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   OT comments  Patient progressing well towards OT goals.  She completes mobility in room, toilet transfers, toileting, grooming and LB ADLs with modified independence.  Good safety awareness and tolerance, discussed safety with bathing in shower when cleared by MD. Planned procedure today, will follow to ensure independence post procedure.  Continue to recommend no OT follow up at dc.    Recommendations for follow up therapy are one component of a multi-disciplinary discharge planning process, led by the attending physician.  Recommendations may be updated based on patient status, additional functional criteria and insurance authorization.    Follow Up Recommendations  No OT follow up;Supervision - Intermittent    Equipment Recommendations  None recommended by OT    Recommendations for Other Services      Precautions / Restrictions Precautions Precautions: Fall Restrictions LLE Weight Bearing: Weight bearing as tolerated       Mobility Bed Mobility Overal bed mobility: Modified Independent                  Transfers Overall transfer level: Modified independent               General transfer comment: completing transfers in room without AD    Balance Overall balance assessment: Mild deficits observed, not formally tested                                         ADL either performed or assessed with clinical  judgement   ADL Overall ADL's : Needs assistance/impaired     Grooming: Wash/dry face;Modified independent;Standing               Lower Body Dressing: Modified independent;Sit to/from stand   Toilet Transfer: Modified Independent;Ambulation;Grab bars;Regular Museum/gallery exhibitions officer and Hygiene: Modified independent       Functional mobility during ADLs: Modified independent General ADL Comments: patient demonstrating independence with mobility (managing wound vac) to/from bathroom and completing toileting, grooming without assistance.     Vision       Perception     Praxis      Cognition Arousal/Alertness: Awake/alert Behavior During Therapy: WFL for tasks assessed/performed Overall Cognitive Status: Within Functional Limits for tasks assessed                                 General Comments: very pleasant        Exercises     Shoulder Instructions       General Comments      Pertinent Vitals/ Pain       Pain Assessment: Faces Faces Pain Scale: Hurts a little bit Pain Location: L hip Pain Descriptors / Indicators: Sore Pain Intervention(s): Monitored during session  Home Living  Prior Functioning/Environment              Frequency  Min 2X/week        Progress Toward Goals  OT Goals(current goals can now be found in the care plan section)  Progress towards OT goals: Progressing toward goals  Acute Rehab OT Goals Patient Stated Goal: for wound to heal OT Goal Formulation: With patient  Plan Discharge plan remains appropriate;Frequency remains appropriate    Co-evaluation                 AM-PAC OT "6 Clicks" Daily Activity     Outcome Measure   Help from another person eating meals?: None Help from another person taking care of personal grooming?: None Help from another person toileting, which includes using toliet, bedpan, or  urinal?: None Help from another person bathing (including washing, rinsing, drying)?: None Help from another person to put on and taking off regular upper body clothing?: None Help from another person to put on and taking off regular lower body clothing?: None 6 Click Score: 24    End of Session    OT Visit Diagnosis: Unsteadiness on feet (R26.81)   Activity Tolerance Patient tolerated treatment well   Patient Left in bed;with call bell/phone within reach   Nurse Communication Mobility status        Time: 6256-3893 OT Time Calculation (min): 10 min  Charges: OT General Charges $OT Visit: 1 Visit OT Treatments $Self Care/Home Management : 8-22 mins  Jolaine Artist, OT Acute Rehabilitation Services Pager [phone] Office [phone]   [name] 02/13/2021, 12:11 PM

## 2021-02-13 NOTE — Op Note (Signed)
   Operative Note   Date: 02/13/2021  Procedure: wound exploration, excisional debridement of necrotic abdominal wall muscle and fascia, 20x5cm  Indication and clinical history: The patient is a 60 y.o. year old female with abscess of L hip s/p multiple debridements. I was called intra-operatively by Dr. Sharol Given to evaluate as the wound tracked superiorly and medially with concern for need for debridement of necrotic muscle and due to proximity to the peritoneal cavity.   Surgeon: Jesusita Oka, MD  Description of procedure: The patient was already positioned, intubated, prepped, draped and the wound opened. Necrotic muscle and fascia were noted in the wound and excisional debridement was performed using electrocautery back to minimally reactive, bleeding muscle. A curette was used to debride the abdominal wall as well. The wound was irrigated using a pulse lavage and two blue vac sponges inserted into the wound. Please see separate operative not by Dr. Sharol Given for the preceding and subsequent portions of the procedure.   Plan to return to OR 9/21 and either I or another surgeon from my group will be available to assist intra-operatively as needed.   Upon entering the organ/wound space, I encountered an infected hematoma.  CASE DATA:  Type of patient?: DOW CASE (Surgical Hospitalist [facility])  Status of Case? URGENT Add On  Infection Present At Time Of Surgery (PATOS)?   Infected hematoma    Jesusita Oka, MD General and [facility]

## 2021-02-13 NOTE — Transfer of Care (Signed)
Immediate Anesthesia Transfer of Care Note  Patient: [name]  Procedure(s) Performed: REPEAT DEBRIDEMENT LEFT HIP ULCER (Left) APPLICATION OF WOUND VAC  Patient Location: PACU  Anesthesia Type:General  Level of Consciousness: awake, alert , oriented and patient cooperative  Airway & Oxygen Therapy: Patient Spontanous Breathing  Post-op Assessment: Report given to RN, Post -op Vital signs reviewed and stable and Patient moving all extremities X 4  Post vital signs: Reviewed and stable  Last Vitals:  Vitals Value Taken Time  BP 100/53 02/13/21 1533  Temp    Pulse 87 02/13/21 1534  Resp 18 02/13/21 1534  SpO2 94 % 02/13/21 1534  Vitals shown include unvalidated device data.  Last Pain:  Vitals:   02/13/21 1344  TempSrc:   PainSc: 0-No pain      Patients Stated Pain Goal: 0 (95/63/87 5643)  Complications: No notable events documented.

## 2021-02-13 NOTE — Progress Notes (Signed)
ANTICOAGULATION CONSULT NOTE - Follow Up Consult  Pharmacy Consult for Warfarin Indication: Mechanical Valve / Afib  No Known Allergies  Patient Measurements: Height: 5' 6.5" (168.9 cm) Weight: 127 kg (279 lb 15.8 oz) IBW/kg (Calculated) : 60.45   Vital Signs: Temp: 98.2 F (36.8 C) (09/16 0813) Temp Source: Oral (09/16 0813) BP: 105/56 (09/16 0813) Pulse Rate: 74 (09/16 0813)  Labs: Recent Labs    02/11/21 0321 02/12/21 0510 02/13/21 0358  LABPROT 21.8* 22.2* 25.6*  INR 1.9* 1.9* 2.3*     Estimated Creatinine Clearance: 61.5 mL/min (A) (by C-G formula based on SCr of 1.32 mg/dL (H)).  Assessment: 30 yof with hx of mechanical valve/afib on warfarin PTA, s/p debridement of abscess and osteomyelitis of L iliac crest, now returning to OR today for repeat debridement. Noted plans per Dr. Jess Barters note to discharge patient on Cipro - will need to follow warfarin regimen closely for adjustments due to DDI.  Appears warfarin was held yesterday pre-op per RN discussion with Dr. Sharol Given.  INR today up to 2.3 (held dose 9/15). No recent CBC. No bleeding issues reported.  Dose PTA - 3.75 mg daily except 7.5 mg po MWF  Goal of Therapy:  INR 2.5 to 3.5 Monitor platelets by anticoagulation protocol: Yes   Plan:  Give warfarin 7.5mg  PO x 1 dose at 1600 Monitor daily INR, CBC, s/sx bleeding   Arturo Morton, PharmD, BCPS Please check AMION for all [place] contact numbers Clinical Pharmacist 02/13/2021 9:31 AM

## 2021-02-13 NOTE — Progress Notes (Signed)
Pacu RN Report to floor given  Gave report to [facility] RN. [phone]. Discussed surgery, meds given in OR and Pacu, VS, IV fluids given, EBL, urine output, pain and other pertinent information. Also discussed if pt had any family or friends here or belongings with them. Discussed that the wound vac canister was changed, it is set on Prevena continuous at 155mm.   Pt exits my care.

## 2021-02-13 NOTE — Progress Notes (Signed)
Not given Pre-surgery ensure per OR, patient is now NPO with sips of meds

## 2021-02-13 NOTE — Progress Notes (Signed)
   02/13/21 1851  Vitals  Temp (!) 97.5 F (36.4 C)  Temp Source Oral  BP 95/69  MAP (mmHg) 79  BP Location Right Arm  BP Method Automatic  Patient Position (if appropriate) Sitting  Pulse Rate 75  Resp 18  Oxygen Therapy  SpO2 92 %  O2 Device Room Air  Post fall vitals

## 2021-02-13 NOTE — Op Note (Signed)
02/13/2021  3:39 PM  PATIENT:  [name]    PRE-OPERATIVE DIAGNOSIS:  Abscess Left Hip  POST-OPERATIVE DIAGNOSIS:  Same  PROCEDURE:  REPEAT DEBRIDEMENT LEFT HIP ULCER,  APPLICATION OF WOUND VAC Local tissue rearrangement for wound closure 20 x 5 cm.  SURGEON:  [name], MD   ANESTHESIA:   General  PREOPERATIVE INDICATIONS:  [name] is a  60 y.o. female with a diagnosis of Abscess Left Hip who failed conservative measures and elected for surgical management.    The risks benefits and alternatives were discussed with the patient preoperatively including but not limited to the risks of infection, bleeding, nerve injury, cardiopulmonary complications, the need for revision surgery, among others, and the patient was willing to proceed.  OPERATIVE IMPLANTS: Praveena cleanse choice blue sponges were placed x2  @ENCIMAGES @  OPERATIVE FINDINGS: Patient had necrosis of the superficial and deep oblique muscles of the abdomen.  Necrotic muscle was sent for cultures.  General surgery was called in to assist with debridement of the oblique and superficial abdominal muscles  OPERATIVE PROCEDURE: Patient was brought the operating room and underwent a general anesthetic.  After adequate levels of anesthesia were obtained patient's left hip was prepped using DuraPrep draped into a sterile field a timeout was called.  Elliptical incision was made around the previous incision this was carried down through healthy viable adipose tissue this carried down to the iliac crest and further iliac crest was resected with a rondure.  The bone was healthy and viable however there is purulence superior to this.  With further dissection there was necrosis and abscess of the superficial oblique muscle as well as necrosis of the deep oblique muscles.  This necrotic muscle was sent for cultures.  General surgery was called into help with the debridement and further debridement was performed with  excision of the superficial oblique muscles.  This was debrided back to healthy viable margin.  The pulsatile lavage was used and Cobb elevator to further debride the deep oblique muscles.  There was healthy muscle after further debridement that was contractile.  The wound VAC sponges were placed x2 deep to the superficial oblique muscles and superficial to the superficial oblique muscle.  Local tissue rearrangement was used to close the skin and reapproximate the edges with 2-0 nylon the wound is 20 cm x 5 cm.  This was covered with derma tack connected to suction and this had a good suction fit with 2 checkmarks.  Patient was extubated taken the PACU in stable condition.  Debridement type: Excisional Debridement  Side: left  Body Location: iliac crest   Tools used for debridement: scalpel and rongeur  Pre-debridement Wound size (cm):   Length: 9        Width: 3     Depth: 3   Post-debridement Wound size (cm):   Length: 20        Width: 5     Depth: 10   Debridement depth beyond dead/damaged tissue down to healthy viable tissue: yes  Tissue layer involved: skin, subcutaneous tissue, muscle / fascia, bone  Nature of tissue removed: Slough, Necrotic, Devitalized Tissue, Non-viable tissue, and Purulence  Irrigation volume: 3 liters     Irrigation fluid type: Normal Saline     DISCHARGE PLANNING:  Antibiotic duration: Continue Maxipime antibiotics  Weightbearing: Weightbearing as tolerated  Pain medication: Continue opioid pathway  Dressing care/ Wound VAC: Continue wound VAC  Ambulatory devices: Walker  Discharge to: Plan to return to  the operating room on Wednesday.  Follow-up: In the office 1 week post operative.

## 2021-02-13 NOTE — Anesthesia Procedure Notes (Signed)
Procedure Name: LMA Insertion Date/Time: 02/13/2021 2:20 PM Performed by: Jonna Munro, CRNA Pre-anesthesia Checklist: Patient identified, Emergency Drugs available, Suction available, Patient being monitored and Timeout performed Patient Re-evaluated:Patient Re-evaluated prior to induction Oxygen Delivery Method: Circle system utilized Preoxygenation: Pre-oxygenation with 100% oxygen Induction Type: IV induction LMA: LMA inserted LMA Size: 4.0 Number of attempts: 1 Placement Confirmation: positive ETCO2 and breath sounds checked- equal and bilateral Tube secured with: Tape Dental Injury: Teeth and Oropharynx as per pre-operative assessment

## 2021-02-13 NOTE — Progress Notes (Signed)
PT Cancellation Note  Patient Details Name: [name] MRN: [id number] DOB: 08-Jun-1960   Cancelled Treatment:    Reason Eval/Treat Not Completed: Other (comment).  Declined PT as she had already been up with OT.  Gone to surgery this PM and will retry as time and pt allow.    Ramond Dial 02/13/2021, 2:22 PM  Mee Hives, PT MS Acute Rehab Dept. Number: [place] and [place]

## 2021-02-14 ENCOUNTER — Inpatient Hospital Stay (HOSPITAL_COMMUNITY): Payer: Medicare Other

## 2021-02-14 LAB — COMPREHENSIVE METABOLIC PANEL
ALT: 9 U/L (ref 0–44)
AST: 12 U/L — ABNORMAL LOW (ref 15–41)
Albumin: 2.3 g/dL — ABNORMAL LOW (ref 3.5–5.0)
Alkaline Phosphatase: 45 U/L (ref 38–126)
Anion gap: 4 — ABNORMAL LOW (ref 5–15)
BUN: 39 mg/dL — ABNORMAL HIGH (ref 8–23)
CO2: 22 mmol/L (ref 22–32)
Calcium: 9.6 mg/dL (ref 8.9–10.3)
Chloride: 109 mmol/L (ref 98–111)
Creatinine, Ser: 1.91 mg/dL — ABNORMAL HIGH (ref 0.44–1.00)
GFR, Estimated: 29 mL/min — ABNORMAL LOW (ref >60)
Glucose, Bld: 111 mg/dL — ABNORMAL HIGH (ref 70–99)
Potassium: 3.4 mmol/L — ABNORMAL LOW (ref 3.5–5.1)
Sodium: 135 mmol/L (ref 135–145)
Total Bilirubin: 0.8 mg/dL (ref 0.3–1.2)
Total Protein: 5.2 g/dL — ABNORMAL LOW (ref 6.5–8.1)

## 2021-02-14 LAB — PROTIME-INR
INR: 2.4 — ABNORMAL HIGH (ref 0.8–1.2)
Prothrombin Time: 25.8 seconds — ABNORMAL HIGH (ref 11.4–15.2)

## 2021-02-14 LAB — CBC
HCT: 26 % — ABNORMAL LOW (ref 36.0–46.0)
HCT: 27.4 % — ABNORMAL LOW (ref 36.0–46.0)
Hemoglobin: 7.7 g/dL — ABNORMAL LOW (ref 12.0–15.0)
Hemoglobin: 8 g/dL — ABNORMAL LOW (ref 12.0–15.0)
MCH: 29.5 pg (ref 26.0–34.0)
MCH: 28.5 pg (ref 26.0–34.0)
MCHC: 29.6 g/dL — ABNORMAL LOW (ref 30.0–36.0)
MCHC: 29.2 g/dL — ABNORMAL LOW (ref 30.0–36.0)
MCV: 99.6 fL (ref 80.0–100.0)
MCV: 97.5 fL (ref 80.0–100.0)
Platelets: 192 10*3/uL (ref 150–400)
Platelets: 170 10*3/uL (ref 150–400)
RBC: 2.61 MIL/uL — ABNORMAL LOW (ref 3.87–5.11)
RBC: 2.81 MIL/uL — ABNORMAL LOW (ref 3.87–5.11)
RDW: 16.6 % — ABNORMAL HIGH (ref 11.5–15.5)
RDW: 17.2 % — ABNORMAL HIGH (ref 11.5–15.5)
WBC: 12.4 10*3/uL — ABNORMAL HIGH (ref 4.0–10.5)
WBC: 11.1 10*3/uL — ABNORMAL HIGH (ref 4.0–10.5)
nRBC: 0 % (ref 0.0–0.2)
nRBC: 0 % (ref 0.0–0.2)

## 2021-02-14 LAB — PREPARE RBC (CROSSMATCH)

## 2021-02-14 MED ORDER — SODIUM CHLORIDE 0.9% IV SOLUTION: Freq: Once | INTRAVENOUS | Status: AC

## 2021-02-14 MED ORDER — PANTOPRAZOLE SODIUM 40 MG PO TBEC
40.0000 mg | DELAYED_RELEASE_TABLET | Freq: Every day | ORAL | Status: DC
  Administered 2021-02-14 – 2021-03-04 (×19): 40 mg via ORAL
  Filled 2021-02-14 (×19): qty 1

## 2021-02-14 MED ORDER — BACID PO TABS
2.0000 | ORAL_TABLET | Freq: Three times a day (TID) | ORAL | Status: DC
  Filled 2021-02-14 (×4): qty 2

## 2021-02-14 MED ORDER — RISAQUAD PO CAPS
2.0000 | ORAL_CAPSULE | Freq: Every day | ORAL | Status: DC
  Administered 2021-02-15 – 2021-03-04 (×19): 2 via ORAL
  Filled 2021-02-14 (×19): qty 2

## 2021-02-14 MED ORDER — SODIUM CHLORIDE 0.9 % IV BOLUS
1000.0000 mL | Freq: Once | INTRAVENOUS | Status: AC
  Administered 2021-02-14: 1000 mL via INTRAVENOUS

## 2021-02-14 NOTE — Progress Notes (Signed)
Patient ID: SCOTT FIX, female   DOB: January 28, 1961, 60 y.o.   MRN: [id number] Patient is postoperative day 1 repeat irrigation debridement left iliac crest.  Of note on this last procedure patient had extensive necrosis of the deep and superficial oblique muscles of the abdomen.  These were debrided back to contractile muscle tissue with good color.  Wound vacs were placed above and below the superficial oblique muscles.   Cultures are pending.  New cultures are showing gram-positive cocci from yesterday.  Patient hemoglobin has dropped to 7.7 and patient is symptomatic and will order 2 units of packed red blood cells with anticipated surgery on Wednesday.  I have added probiotics patient complains of GI upset.  Her INR is 2.4 and the Coumadin is still on hold.  Her white cell count is 12.4.  I have ordered a CT scan with contrast I have spoke with radiology to see if we can better delineate the extent of the infection involving the oblique muscles of the abdomen.

## 2021-02-14 NOTE — Progress Notes (Signed)
Spoke to RN at [place] regarding the need for updated labs. Labs still not collected at this time

## 2021-02-14 NOTE — Progress Notes (Signed)
Pending CT of ABD with contrast order. BMET completed and results called to CT. GFR 29. CT requested Dr. Sharol Given to be notified. Dr. Sharol Given changed the CT scant of ABD to without contrast.

## 2021-02-14 NOTE — Progress Notes (Signed)
ANTICOAGULATION CONSULT NOTE - Follow Up Consult  Pharmacy Consult for Warfarin Indication: Mechanical Valve / Afib  No Known Allergies  Patient Measurements: Height: 5' 6.5" (168.9 cm) Weight: 127 kg (279 lb 15.8 oz) IBW/kg (Calculated) : 60.45 kg  Vital Signs: Temp: 98 F (36.7 C) (09/17 0511) Temp Source: Oral (09/17 0511) BP: 86/44 (09/17 0708) Pulse Rate: 69 (09/17 0708)  Labs: Recent Labs    02/12/21 0510 02/13/21 0358 02/14/21 0406  HGB  --   --  7.7*  HCT  --   --  26.0*  PLT  --   --  192  LABPROT 22.2* 25.6* 25.8*  INR 1.9* 2.3* 2.4*    Estimated Creatinine Clearance: 61.5 mL/min (A) (by C-G formula based on SCr of 1.32 mg/dL (H)).  Assessment: 60 yo female with a history of atrial fibrillation and a mechanical valve on warfarin PTA. 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 Pharmacy is consulted to dose warfarin.  PTA warfarin dose: 3.75 mg daily except 7.5 mg MWF  Per Dr. Jess Barters note, plan to discharge the patient on ciprofloxacin, so will need to follow the warfarin regimen closely for adjustments due to drug-drug interactions. No new interacting medications have been initiated, the patient's diet intake has slightly decreased due to surgery.  Last warfarin dose given on 9/14, dose ordered for 9/16 was not given. INR is slightly subtherapeutic today at 2.4. The INR has been slowly rising despite not having a recent warfarin dose. Hgb 7.7, platelets 192. After discussing with Dr. Sharol Given, due to recent surgery and plans to return to the OR on 9/21, will hold warfarin today.  Goal of Therapy:  INR 2.5 to 3.5 Monitor platelets by anticoagulation protocol: Yes   Plan:  HOLD warfarin tonight per Dr. Sharol Given Monitor daily PT/INR and CBC Watch for signs and symptoms of bleeding Continue to follow diet changes or drug-drug  interactions  Shauna Hugh, PharmD, [place]  PGY-2 Pharmacy Resident 02/14/2021 7:54 AM  Please check [email or web address] for unit-specific pharmacy phone numbers.

## 2021-02-14 NOTE — Progress Notes (Signed)
At the start of the 2nd unit of blood, noticed that patient's hear rate is fluctuating from 80-120 and irregularly irregular. No subjective complaints, bp also is 34-48'T systolic. Dr. Sharol Given made aware. New orders received. Metoprolol given per order.

## 2021-02-14 NOTE — Progress Notes (Signed)
Late entry for 1300: VAC continously beeping block alert , suction on but only at 25-50 mmhg. Attempted to change disc , small black sponge applied over incision where the disc lays and transparent dressing reinforced, VAC working well at 186mmhg after.

## 2021-02-14 NOTE — Progress Notes (Signed)
OT Cancellation Note  Patient Details Name: [name] MRN: [id number] DOB: 1960/10/04   Cancelled Treatment:    Reason Eval/Treat Not Completed: Medical issues which prohibited therapy Per RN, pt with hypotension and noted with fall from [facility] yesterday. RN requests to hold therapy today and will follow-up tomorrow as schedule permits.   [name] 02/14/2021, 10:26 AM

## 2021-02-15 LAB — BASIC METABOLIC PANEL
Anion gap: 7 (ref 5–15)
BUN: 40 mg/dL — ABNORMAL HIGH (ref 8–23)
CO2: 21 mmol/L — ABNORMAL LOW (ref 22–32)
Calcium: 10 mg/dL (ref 8.9–10.3)
Chloride: 109 mmol/L (ref 98–111)
Creatinine, Ser: 2.07 mg/dL — ABNORMAL HIGH (ref 0.44–1.00)
GFR, Estimated: 27 mL/min — ABNORMAL LOW (ref >60)
Glucose, Bld: 94 mg/dL (ref 70–99)
Potassium: 3.6 mmol/L (ref 3.5–5.1)
Sodium: 137 mmol/L (ref 135–145)

## 2021-02-15 LAB — TYPE AND SCREEN
ABO/RH(D): O POS
Antibody Screen: NEGATIVE
Unit division: 0
Unit division: 0

## 2021-02-15 LAB — BPAM RBC
Blood Product Expiration Date: 202210172359
Blood Product Expiration Date: 202210172359
ISSUE DATE / TIME: 202209171252
ISSUE DATE / TIME: 202209171732
Unit Type and Rh: 5100
Unit Type and Rh: 5100

## 2021-02-15 LAB — CBC
HCT: 28.9 % — ABNORMAL LOW (ref 36.0–46.0)
Hemoglobin: 9 g/dL — ABNORMAL LOW (ref 12.0–15.0)
MCH: 30.1 pg (ref 26.0–34.0)
MCHC: 31.1 g/dL (ref 30.0–36.0)
MCV: 96.7 fL (ref 80.0–100.0)
Platelets: 174 10*3/uL (ref 150–400)
RBC: 2.99 MIL/uL — ABNORMAL LOW (ref 3.87–5.11)
RDW: 17 % — ABNORMAL HIGH (ref 11.5–15.5)
WBC: 9.7 10*3/uL (ref 4.0–10.5)
nRBC: 0 % (ref 0.0–0.2)

## 2021-02-15 LAB — PROTIME-INR
INR: 1.9 — ABNORMAL HIGH (ref 0.8–1.2)
Prothrombin Time: 22 seconds — ABNORMAL HIGH (ref 11.4–15.2)

## 2021-02-15 MED ORDER — SODIUM CHLORIDE 0.9 % IV SOLN
2.0000 g | Freq: Two times a day (BID) | INTRAVENOUS | Status: DC
  Administered 2021-02-15 – 2021-02-24 (×17): 2 g via INTRAVENOUS
  Filled 2021-02-15 (×18): qty 2

## 2021-02-15 MED ORDER — ENSURE ENLIVE PO LIQD
237.0000 mL | Freq: Two times a day (BID) | ORAL | Status: DC
  Administered 2021-02-16 – 2021-02-27 (×9): 237 mL via ORAL

## 2021-02-15 NOTE — Progress Notes (Signed)
Occupational Therapy Treatment Patient Details Name: [name] MRN: [id number] DOB: [birth year] Today's Date: 02/15/2021   History of present illness 60 y.o. 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 Due to the pt's Coumadin she has had continual increased drainage from the wound and currently has a large amount of purulent drainage. Repeat debridement L hip ulcer 9/14, planned 9/16. 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   OT comments  Pt progressing towards OT goals this session. Pt found in puddle of urine despite purewick placement, so LB bathingf and complete linen change complete. Overall min guard/supervision level for transfers, peri care in standing, standing grooming at sink and in room ambulation with RW today - closer monitoring due to fall with RN staff post-op with BSC. POC remains appropriate at this time. OT will continue to follow acutely.   Recommendations for follow up therapy are one component of a multi-disciplinary discharge planning process, led by the attending physician.  Recommendations may be updated based on patient status, additional functional criteria and insurance authorization.    Follow Up Recommendations  No OT follow up;Supervision - Intermittent    Equipment Recommendations  None recommended by OT    Recommendations for Other Services      Precautions / Restrictions Precautions Precautions: Fall Precaution Comments: Fall risk present, but very minimal Restrictions Weight Bearing Restrictions: Yes LLE Weight Bearing: Weight bearing as tolerated       Mobility Bed Mobility Overal bed mobility: Modified Independent                  Transfers Overall transfer level: Modified independent Equipment used: Rolling walker (2 wheeled);None Transfers: Sit to/from Stand Sit to Stand: Min guard Stand pivot transfers: Min guard        General transfer comment: min guard for safety with recent fall during BSC transfer    Balance Overall balance assessment: Mild deficits observed, not formally tested Sitting-balance support: No upper extremity supported;Feet supported Sitting balance-Leahy Scale: Good     Standing balance support: Bilateral upper extremity supported;No upper extremity supported;During functional activity Standing balance-Leahy Scale: Fair Standing balance comment: pt completed dynamic balance tasks during ADLs with no LOB                           ADL either performed or assessed with clinical judgement   ADL Overall ADL's : Needs assistance/impaired     Grooming: Wash/dry face;Standing;Oral care;Min guard Grooming Details (indicate cue type and reason): sink level                 Toilet Transfer: Ambulation;Grab bars;Regular Toilet;Min guard;RW   Toileting- Clothing Manipulation and Hygiene: Modified independent;Sit to/from stand Toileting - Clothing Manipulation Details (indicate cue type and reason): with warm wash cloth     Functional mobility during ADLs: Min guard;Rolling walker (due to fall) General ADL Comments: Pt overall supervision level today for UB and LB ADL and with in room mobility     Vision   Vision Assessment?: No apparent visual deficits   Perception     Praxis      Cognition Arousal/Alertness: Awake/alert Behavior During Therapy: WFL for tasks assessed/performed Overall Cognitive Status: Within Functional Limits for tasks assessed  General Comments: very pleasant        Exercises     Shoulder Instructions       General Comments no LOB or signs of syncope    Pertinent Vitals/ Pain       Pain Assessment: No/denies pain Pain Intervention(s): Monitored during session  Home Living                                          Prior Functioning/Environment               Frequency  Min 2X/week        Progress Toward Goals  OT Goals(current goals can now be found in the care plan section)  Progress towards OT goals: Progressing toward goals  Acute Rehab OT Goals Patient Stated Goal: for wound to heal OT Goal Formulation: With patient Time For Goal Achievement: 02/19/21 Potential to Achieve Goals: Good ADL Goals Pt Will Perform Grooming: with modified independence;standing Pt Will Perform Upper Body Bathing: with modified independence;sitting Pt Will Perform Lower Body Bathing: with modified independence;sit to/from stand Pt Will Perform Upper Body Dressing: Independently Pt Will Perform Lower Body Dressing: with modified independence;with adaptive equipment;sit to/from stand Pt Will Transfer to Toilet: with modified independence;ambulating Pt Will Perform Toileting - Clothing Manipulation and hygiene: with modified independence;sit to/from stand Pt Will Perform Tub/Shower Transfer: Tub transfer;rolling walker Pt/caregiver will Perform Home Exercise Program: Increased ROM;Increased strength;Both right and left upper extremity;With written HEP provided  Plan Discharge plan remains appropriate;Frequency remains appropriate    Co-evaluation                 AM-PAC OT "6 Clicks" Daily Activity     Outcome Measure   Help from another person eating meals?: None Help from another person taking care of personal grooming?: A Little Help from another person toileting, which includes using toliet, bedpan, or urinal?: None Help from another person bathing (including washing, rinsing, drying)?: A Little Help from another person to put on and taking off regular upper body clothing?: None Help from another person to put on and taking off regular lower body clothing?: A Little 6 Click Score: 21    End of Session Equipment Utilized During Treatment: Rolling walker  OT Visit Diagnosis: Unsteadiness on feet (R26.81)   Activity Tolerance Patient  tolerated treatment well   Patient Left in bed;with call bell/phone within reach   Nurse Communication Mobility status        Time: 3419-3790 OT Time Calculation (min): 44 min  Charges: OT General Charges $OT Visit: 1 Visit OT Treatments $Self Care/Home Management : 23-37 mins $Therapeutic Activity: 8-22 mins  Jesse Sans OTR/L Acute Rehabilitation Services Pager: [phone] Office: [place] 02/15/2021, 12:27 PM

## 2021-02-15 NOTE — Progress Notes (Signed)
PHARMACY NOTE:  ANTIMICROBIAL RENAL DOSAGE ADJUSTMENT  Current antimicrobial regimen includes a mismatch between antimicrobial dosage and estimated renal function.  As per policy approved by the Pharmacy & Therapeutics and Medical Executive Committees, the antimicrobial dosage will be adjusted accordingly.  Current antimicrobial dosage: Cefepime IV 2 g Q8H  Indication: Left iliac crest osteomyelitis and abscess  Renal Function:  Estimated Creatinine Clearance: 39.2 mL/min (A) (by C-G formula based on SCr of 2.07 mg/dL (H)). []      On intermittent HD, scheduled: []      On CRRT    Antimicrobial dosage has been changed to: Cefepime IV 2 g Q12H  Additional comments: Currently on day 12 of cefepime, continue to follow and assess for length of therapy.   Thank you for allowing pharmacy to be a part of this patient's care.  Shauna Hugh, PharmD, [place]  PGY-2 Pharmacy Resident 02/15/2021 8:50 AM  Please check [email or web address] for unit-specific pharmacy phone numbers.

## 2021-02-15 NOTE — Progress Notes (Signed)
Patient ID: [name], female   DOB: [birth year], 60 y.o.   MRN: [id number] Patient is seen in follow-up for repeat debridement left hip.  The CT scan is reviewed shows no further involvement with either abscess or necrotic muscle.  We will continue to hold the Coumadin with an INR of 1.9.  Patient had acute blood loss anemia with a hemoglobin dropping to 7.7 she received transfusion and her hemoglobin is currently 9.0.  Her white cell count has decreased from 12.4-9.7.  Necrotic deep muscle cultures are also positive for Serratia, all cultures are consistent.  Wound VAC is functioning well without blockage or leaks.

## 2021-02-15 NOTE — Progress Notes (Signed)
ANTICOAGULATION CONSULT NOTE - Follow Up Consult  Pharmacy Consult for Warfarin Indication: Mechanical Valve / Afib  No Known Allergies  Patient Measurements: Height: 5' 6.5" (168.9 cm) Weight: 127 kg (279 lb 15.8 oz) IBW/kg (Calculated) : 60.45 kg  Vital Signs: Temp: 98.7 F (37.1 C) (09/18 0807) Temp Source: Oral (09/18 0807) BP: 106/63 (09/18 0807) Pulse Rate: 75 (09/18 0807)  Labs: Recent Labs    02/13/21 0358 02/14/21 0406 02/14/21 0406 02/14/21 1602 02/15/21 0328  HGB  --  7.7*   < > 8.0* 9.0*  HCT  --  26.0*  --  27.4* 28.9*  PLT  --  192  --  170 174  LABPROT 25.6* 25.8*  --   --  22.0*  INR 2.3* 2.4*  --   --  1.9*  CREATININE  --   --   --  1.91* 2.07*   < > = values in this interval not displayed.    Estimated Creatinine Clearance: 39.2 mL/min (A) (by C-G formula based on SCr of 2.07 mg/dL (H)).  Assessment: 60 yo female with a history of atrial fibrillation and a mechanical valve on warfarin PTA. The patient is now s/p debridement of abscess and osteomyelitis of L iliac crest with repeat debridement of the L hip ulcer and wound vac application on 4/56 and 9/16. Pharmacy is consulted to dose warfarin.  PTA warfarin dose: 3.75 mg daily except 7.5 mg MWF  Per Dr. Jess Barters note, plan to discharge the patient on ciprofloxacin, so will need to follow the warfarin regimen closely for adjustments due to drug-drug interactions. No new interacting medications have been initiated.   Last warfarin dose given on 9/14, dose ordered for 9/16 was not given. Dose 9/17 also held. INR is subtherapeutic today at 1.9.  Hgb 9.0, platelets 174. After discussing with Dr. Sharol Given, due to recent surgery and plans to return to the OR on 9/21, will hold warfarin until after surgery.  Goal of Therapy:  INR 2.5 to 3.5 Monitor platelets by anticoagulation protocol: Yes   Plan:  HOLD warfarin until after surgery per Dr. Sharol Given Monitor daily PT/INR and CBC Watch for signs and symptoms of  bleeding Continue to follow post procedure plans.   Marzella Schlein [place], Student Pharmacist  02/15/2021 8:58 AM  Please check [email or web address] for unit-specific pharmacy phone numbers.

## 2021-02-16 ENCOUNTER — Other Ambulatory Visit: Payer: Self-pay | Admitting: Physician Assistant

## 2021-02-16 ENCOUNTER — Encounter (HOSPITAL_COMMUNITY): Payer: Self-pay | Admitting: Orthopedic Surgery

## 2021-02-16 LAB — CBC
HCT: 28.5 % — ABNORMAL LOW (ref 36.0–46.0)
Hemoglobin: 8.9 g/dL — ABNORMAL LOW (ref 12.0–15.0)
MCH: 30.1 pg (ref 26.0–34.0)
MCHC: 31.2 g/dL (ref 30.0–36.0)
MCV: 96.3 fL (ref 80.0–100.0)
Platelets: 184 10*3/uL (ref 150–400)
RBC: 2.96 MIL/uL — ABNORMAL LOW (ref 3.87–5.11)
RDW: 16.9 % — ABNORMAL HIGH (ref 11.5–15.5)
WBC: 10.6 10*3/uL — ABNORMAL HIGH (ref 4.0–10.5)
nRBC: 0 % (ref 0.0–0.2)

## 2021-02-16 LAB — PROTIME-INR
INR: 1.4 — ABNORMAL HIGH (ref 0.8–1.2)
Prothrombin Time: 17.1 seconds — ABNORMAL HIGH (ref 11.4–15.2)

## 2021-02-16 LAB — AEROBIC/ANAEROBIC CULTURE W GRAM STAIN (SURGICAL/DEEP WOUND): Gram Stain: NONE SEEN

## 2021-02-16 NOTE — Progress Notes (Signed)
Wound-Vac alarming due to blockage, canister changed and tubing stripped, however,  blockage remains. MD notified, [place] unit powered off at this time, dressing to be changed this am. Will monitor closely

## 2021-02-16 NOTE — Progress Notes (Signed)
Physical Therapy Treatment Patient Details Name: [name] MRN: [id number] DOB: 1960-09-03 Today's Date: 02/16/2021   History of Present Illness 60 y.o. 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 Due to the pt's Coumadin she has had continual increased drainage from the wound and currently has a large amount of purulent drainage. Repeat debridement L hip ulcer 9/14, 9/16; planned 9/21. 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    PT Comments    Pt was seen for mobility on RW to get to [facility] and back but is only tolerating short trips now.  Her plan is to get back to more independent longer trips as pt is expecting to go home without even HHPT.  Recommend revising this if she does not walk farther next session.   Pt is interested in going home but could help set her up directly to HHPT if needed.   Recommendations for follow up therapy are one component of a multi-disciplinary discharge planning process, led by the attending physician.  Recommendations may be updated based on patient status, additional functional criteria and insurance authorization.  Follow Up Recommendations  No PT follow up     Equipment Recommendations  None recommended by PT    Recommendations for Other Services       Precautions / Restrictions Precautions Precautions: Fall Precaution Comments: manage lines to reduce fall risk Restrictions Weight Bearing Restrictions: Yes LLE Weight Bearing: Weight bearing as tolerated     Mobility  Bed Mobility Overal bed mobility: Modified Independent                  Transfers Overall transfer level: Needs assistance Equipment used: Rolling walker (2 wheeled) Transfers: Sit to/from Stand Sit to Stand: Supervision Stand pivot transfers: Supervision          Ambulation/Gait Ambulation/Gait assistance: Supervision Gait Distance (Feet): 10 Feet (5 x  2) Assistive device: Rolling walker (2 wheeled) Gait Pattern/deviations: Step-through pattern;Decreased step length - right;Decreased step length - left;Decreased stride length;Trunk flexed Gait velocity: decreased   General Gait Details: able to walk just short trips over distraction of vac being poorly sealed   Stairs             Wheelchair Mobility    Modified Rankin (Stroke Patients Only)       Balance Overall balance assessment: Needs assistance Sitting-balance support: Feet supported Sitting balance-Leahy Scale: Good     Standing balance support: Bilateral upper extremity supported;During functional activity Standing balance-Leahy Scale: Fair                              Cognition Arousal/Alertness: Awake/alert Behavior During Therapy: Flat affect Overall Cognitive Status: Within Functional Limits for tasks assessed                                 General Comments: pt is frustrated a bit by the wound vac not being sealed      Exercises      General Comments General comments (skin integrity, edema, etc.): pt is up to [facility] with a few steps and cannot get her to walk further due to vac being leaky per machine      Pertinent Vitals/Pain Pain Assessment: Faces Faces Pain Scale: Hurts a little bit Pain Location: L hip Pain Descriptors / Indicators: Sore Pain  Intervention(s): Limited activity within patient's tolerance    Home Living                      Prior Function            PT Goals (current goals can now be found in the care plan section) Acute Rehab PT Goals Patient Stated Goal: wound vac to seal Progress towards PT goals: Progressing toward goals    Frequency    Min 2X/week      PT Plan Current plan remains appropriate    Co-evaluation              AM-PAC PT "6 Clicks" Mobility   Outcome Measure  Help needed turning from your back to your side while in a flat bed without using bedrails?:  None Help needed moving from lying on your back to sitting on the side of a flat bed without using bedrails?: None Help needed moving to and from a bed to a chair (including a wheelchair)?: None Help needed standing up from a chair using your arms (e.g., wheelchair or bedside chair)?: None Help needed to walk in hospital room?: A Little Help needed climbing 3-5 steps with a railing? : A Little 6 Click Score: 22    End of Session Equipment Utilized During Treatment: Gait belt Activity Tolerance: Patient tolerated treatment well Patient left: in bed;with call bell/phone within reach Nurse Communication: Mobility status PT Visit Diagnosis: Difficulty in walking, not elsewhere classified (R26.2);Muscle weakness (generalized) (M62.81);Unsteadiness on feet (R26.81);Other (comment) (open skin on L hip from debridement)     Time: 1510-1530 PT Time Calculation (min) (ACUTE ONLY): 20 min  Charges:  $Therapeutic Activity: 8-22 mins  Ramond Dial 02/16/2021, 5:01 PM  Mee Hives, PT MS Acute Rehab Dept. Number: [place]

## 2021-02-16 NOTE — Progress Notes (Addendum)
Occupational Therapy Treatment Patient Details Name: [name] MRN: [id number] DOB: September 14, 1960 Today's Date: 02/16/2021   History of present illness 60 y.o. 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 Due to the pt's Coumadin she has had continual increased drainage from the wound and currently has a large amount of purulent drainage. Repeat debridement L hip ulcer 9/14, 9/16; planned 9/21. 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   OT comments  Patient supine in bed and agreeable to OT session with encouragement.  Patient completing toilet transfers and toileting with supervision, cueing for safety and pacing.  Pt reports feeling depressed, down about additional procedure on Wed.- offered to get chaplin to speak with her, pt declined. Will follow acutely.    Recommendations for follow up therapy are one component of a multi-disciplinary discharge planning process, led by the attending physician.  Recommendations may be updated based on patient status, additional functional criteria and insurance authorization.    Follow Up Recommendations  No OT follow up;Supervision - Intermittent    Equipment Recommendations  None recommended by OT    Recommendations for Other Services      Precautions / Restrictions Precautions Precautions: Fall Precaution Comments: Fall risk present, but very minimal Restrictions Weight Bearing Restrictions: Yes LLE Weight Bearing: Weight bearing as tolerated       Mobility Bed Mobility                    Transfers Overall transfer level: Needs assistance Equipment used: Rolling walker (2 wheeled) Transfers: Sit to/from Stand Sit to Stand: Supervision         General transfer comment: for safety, cueing for hand placement    Balance Overall balance assessment: Mild deficits observed, not formally tested                                          ADL either performed or assessed with clinical judgement   ADL Overall ADL's : Needs assistance/impaired                     Lower Body Dressing: Supervision/safety;Sit to/from stand   Toilet Transfer: Supervision/safety;Ambulation;BSC;RW Toilet Transfer Details (indicate cue type and reason): supervision for safety Toileting- Clothing Manipulation and Hygiene: Supervision/safety;Sit to/from stand       Functional mobility during ADLs: Supervision/safety;Rolling walker;Cueing for safety       Vision   Vision Assessment?: No apparent visual deficits   Perception     Praxis      Cognition Arousal/Alertness: Awake/alert Behavior During Therapy: Flat affect Overall Cognitive Status: Within Functional Limits for tasks assessed                                 General Comments: pt cooperative, reports feeling depressed. flat affect        Exercises     Shoulder Instructions       General Comments no LOB or signs of syncope    Pertinent Vitals/ Pain       Pain Assessment: Faces Faces Pain Scale: Hurts a little bit Pain Location: L hip Pain Descriptors / Indicators: Sore Pain Intervention(s): Limited activity within patient's tolerance;Monitored during session;Repositioned  Home Living  Prior Functioning/Environment              Frequency  Min 2X/week        Progress Toward Goals  OT Goals(current goals can now be found in the care plan section)  Progress towards OT goals: Progressing toward goals  Acute Rehab OT Goals Patient Stated Goal: for wound to heal OT Goal Formulation: With patient  Plan Discharge plan remains appropriate;Frequency remains appropriate    Co-evaluation                 AM-PAC OT "6 Clicks" Daily Activity     Outcome Measure   Help from another person eating meals?: None Help from another person taking  care of personal grooming?: A Little Help from another person toileting, which includes using toliet, bedpan, or urinal?: None Help from another person bathing (including washing, rinsing, drying)?: A Little Help from another person to put on and taking off regular upper body clothing?: None Help from another person to put on and taking off regular lower body clothing?: A Little 6 Click Score: 21    End of Session Equipment Utilized During Treatment: Rolling walker  OT Visit Diagnosis: Unsteadiness on feet (R26.81)   Activity Tolerance Patient tolerated treatment well   Patient Left in bed;with call bell/phone within reach;with bed alarm set   Nurse Communication Mobility status        Time: 0712-1975 OT Time Calculation (min): 25 min  Charges: OT General Charges $OT Visit: 1 Visit OT Treatments $Self Care/Home Management : 23-37 mins  Jolaine Artist, OT Acute Rehabilitation Services Pager [phone] Office [phone]   [name] 02/16/2021, 12:50 PM

## 2021-02-16 NOTE — Progress Notes (Signed)
Patient ID: [name], female   DOB: 1961/01/09, 60 y.o.   MRN: [id number] Patient is status post repeat debridement abscess left iliac crest.  The wound VAC is turned off.  I replaced the Lilly pad drainage tube there was good drainage and suction of clear serosanguineous fluid plan for return to the operating room on Wednesday.  CT scan showed no further muscle abscess.

## 2021-02-17 LAB — PROTIME-INR
INR: 1.4 — ABNORMAL HIGH (ref 0.8–1.2)
Prothrombin Time: 17.4 seconds — ABNORMAL HIGH (ref 11.4–15.2)

## 2021-02-17 LAB — CBC
HCT: 27.7 % — ABNORMAL LOW (ref 36.0–46.0)
Hemoglobin: 8.4 g/dL — ABNORMAL LOW (ref 12.0–15.0)
MCH: 29.8 pg (ref 26.0–34.0)
MCHC: 30.3 g/dL (ref 30.0–36.0)
MCV: 98.2 fL (ref 80.0–100.0)
Platelets: 192 10*3/uL (ref 150–400)
RBC: 2.82 MIL/uL — ABNORMAL LOW (ref 3.87–5.11)
RDW: 16.9 % — ABNORMAL HIGH (ref 11.5–15.5)
WBC: 8.4 10*3/uL (ref 4.0–10.5)
nRBC: 0 % (ref 0.0–0.2)

## 2021-02-17 NOTE — H&P (View-Only) (Signed)
Patient ID: [name], female   DOB: [birth year], 60 y.o.   MRN: [id number] The wound VAC canister is almost completely full with serosanguineous drainage.  The tube is intermittently blocked.  The tube was changed yesterday.  Plan for return to the operating room tomorrow morning with repeat debridement we will have general surgery involved to further evaluate the oblique muscles.  Anticipate wound closure.  All soft tissue cultures to date are positive for Serratia.  Hemoglobin stable at 8.4 white cell count has continued to decrease currently 8.4.  INR stable at 1.4.

## 2021-02-17 NOTE — Progress Notes (Signed)
Patient ID: DEMARIS BOUSQUET, female   DOB: Jan 07, 1961, 60 y.o.   MRN: [id number] The wound VAC canister is almost completely full with serosanguineous drainage.  The tube is intermittently blocked.  The tube was changed yesterday.  Plan for return to the operating room tomorrow morning with repeat debridement we will have general surgery involved to further evaluate the oblique muscles.  Anticipate wound closure.  All soft tissue cultures to date are positive for Serratia.  Hemoglobin stable at 8.4 white cell count has continued to decrease currently 8.4.  INR stable at 1.4.

## 2021-02-18 ENCOUNTER — Inpatient Hospital Stay (HOSPITAL_COMMUNITY): Payer: Medicare Other | Admitting: Registered Nurse

## 2021-02-18 ENCOUNTER — Encounter (HOSPITAL_COMMUNITY): Payer: Self-pay | Admitting: Orthopedic Surgery

## 2021-02-18 ENCOUNTER — Encounter (HOSPITAL_COMMUNITY)
Admission: RE | Disposition: A | Discharge status: Home Health | Payer: Self-pay | Source: Home / Self Care | Attending: Orthopedic Surgery

## 2021-02-18 DIAGNOSIS — M86152 Other acute osteomyelitis, left femur: Secondary | ICD-10-CM | POA: Diagnosis not present

## 2021-02-18 HISTORY — PX: I & D EXTREMITY: SHX5045

## 2021-02-18 LAB — CBC
HCT: 27.4 % — ABNORMAL LOW (ref 36.0–46.0)
Hemoglobin: 8.1 g/dL — ABNORMAL LOW (ref 12.0–15.0)
MCH: 29.3 pg (ref 26.0–34.0)
MCHC: 29.6 g/dL — ABNORMAL LOW (ref 30.0–36.0)
MCV: 99.3 fL (ref 80.0–100.0)
Platelets: 223 10*3/uL (ref 150–400)
RBC: 2.76 MIL/uL — ABNORMAL LOW (ref 3.87–5.11)
RDW: 17.1 % — ABNORMAL HIGH (ref 11.5–15.5)
WBC: 9.5 10*3/uL (ref 4.0–10.5)
nRBC: 0 % (ref 0.0–0.2)

## 2021-02-18 LAB — PROTIME-INR
INR: 1.3 — ABNORMAL HIGH (ref 0.8–1.2)
Prothrombin Time: 16 seconds — ABNORMAL HIGH (ref 11.4–15.2)

## 2021-02-18 SURGERY — IRRIGATION AND DEBRIDEMENT EXTREMITY
Anesthesia: General | Laterality: Left

## 2021-02-18 MED ORDER — HYDROMORPHONE HCL 1 MG/ML IJ SOLN
0.2500 mg | INTRAMUSCULAR | Status: DC | PRN
  Administered 2021-02-18: 0.5 mg via INTRAVENOUS

## 2021-02-18 MED ORDER — METOCLOPRAMIDE HCL 5 MG PO TABS: 5.0000 mg | ORAL_TABLET | Freq: Three times a day (TID) | ORAL | Status: DC | PRN

## 2021-02-18 MED ORDER — GENTAMICIN SULFATE 40 MG/ML IJ SOLN
INTRAMUSCULAR | Status: DC | PRN
  Administered 2021-02-18: 240 mg

## 2021-02-18 MED ORDER — MIDAZOLAM HCL 2 MG/2ML IJ SOLN
INTRAMUSCULAR | Status: DC | PRN
  Administered 2021-02-18: 2 mg via INTRAVENOUS

## 2021-02-18 MED ORDER — POVIDONE-IODINE 10 % EX SWAB: 2.0000 "application " | Freq: Once | CUTANEOUS | Status: DC

## 2021-02-18 MED ORDER — ONDANSETRON HCL 4 MG/2ML IJ SOLN
4.0000 mg | Freq: Four times a day (QID) | INTRAMUSCULAR | Status: DC | PRN
  Administered 2021-02-20: 4 mg via INTRAVENOUS

## 2021-02-18 MED ORDER — 0.9 % SODIUM CHLORIDE (POUR BTL) OPTIME
TOPICAL | Status: DC | PRN
  Administered 2021-02-18: 1000 mL

## 2021-02-18 MED ORDER — DOCUSATE SODIUM 100 MG PO CAPS
100.0000 mg | ORAL_CAPSULE | Freq: Two times a day (BID) | ORAL | Status: DC
Start: 2021-02-18 — End: 2021-02-20
  Administered 2021-02-18 – 2021-02-19 (×3): 100 mg via ORAL
  Filled 2021-02-18 (×3): qty 1

## 2021-02-18 MED ORDER — HYDROMORPHONE HCL 1 MG/ML IJ SOLN
INTRAMUSCULAR | Status: AC
  Filled 2021-02-18: qty 1

## 2021-02-18 MED ORDER — OXYCODONE HCL 5 MG PO TABS: 5.0000 mg | ORAL_TABLET | Freq: Once | ORAL | Status: DC | PRN

## 2021-02-18 MED ORDER — FENTANYL CITRATE (PF) 250 MCG/5ML IJ SOLN
INTRAMUSCULAR | Status: AC
  Filled 2021-02-18: qty 5

## 2021-02-18 MED ORDER — VANCOMYCIN HCL 1000 MG IV SOLR
INTRAVENOUS | Status: AC
  Filled 2021-02-18: qty 20

## 2021-02-18 MED ORDER — CHLORHEXIDINE GLUCONATE 4 % EX LIQD: 60.0000 mL | Freq: Once | CUTANEOUS | Status: DC

## 2021-02-18 MED ORDER — DAKINS (FULL STRENGTH) SOLUTION 0.5%
Freq: Once | CUTANEOUS | Status: DC
  Filled 2021-02-18: qty 1000

## 2021-02-18 MED ORDER — PROPOFOL 10 MG/ML IV BOLUS
INTRAVENOUS | Status: AC
  Filled 2021-02-18: qty 20

## 2021-02-18 MED ORDER — PROPOFOL 10 MG/ML IV BOLUS
INTRAVENOUS | Status: DC | PRN
  Administered 2021-02-18: 150 mg via INTRAVENOUS

## 2021-02-18 MED ORDER — SODIUM CHLORIDE 0.9 % IV SOLN: INTRAVENOUS | Status: DC

## 2021-02-18 MED ORDER — FENTANYL CITRATE (PF) 250 MCG/5ML IJ SOLN
INTRAMUSCULAR | Status: DC | PRN
  Administered 2021-02-18: 100 ug via INTRAVENOUS

## 2021-02-18 MED ORDER — PROMETHAZINE HCL 25 MG/ML IJ SOLN: 6.2500 mg | INTRAMUSCULAR | Status: DC | PRN

## 2021-02-18 MED ORDER — METOCLOPRAMIDE HCL 5 MG/ML IJ SOLN: 5.0000 mg | Freq: Three times a day (TID) | INTRAMUSCULAR | Status: DC | PRN

## 2021-02-18 MED ORDER — CHLORHEXIDINE GLUCONATE 0.12 % MT SOLN
15.0000 mL | OROMUCOSAL | Status: AC
  Administered 2021-02-18: 15 mL via OROMUCOSAL
  Filled 2021-02-18: qty 15

## 2021-02-18 MED ORDER — GENTAMICIN SULFATE 40 MG/ML IJ SOLN
INTRAMUSCULAR | Status: AC
  Filled 2021-02-18: qty 6

## 2021-02-18 MED ORDER — OXYCODONE HCL 5 MG/5ML PO SOLN: 5.0000 mg | Freq: Once | ORAL | Status: DC | PRN

## 2021-02-18 MED ORDER — SCOPOLAMINE 1 MG/3DAYS TD PT72
MEDICATED_PATCH | TRANSDERMAL | Status: DC | PRN
  Administered 2021-02-18: 1 via TRANSDERMAL

## 2021-02-18 MED ORDER — ONDANSETRON HCL 4 MG/2ML IJ SOLN
INTRAMUSCULAR | Status: DC | PRN
  Administered 2021-02-18: 4 mg via INTRAVENOUS

## 2021-02-18 MED ORDER — LIDOCAINE HCL (CARDIAC) PF 100 MG/5ML IV SOSY
PREFILLED_SYRINGE | INTRAVENOUS | Status: DC | PRN
  Administered 2021-02-18: 60 mg via INTRAVENOUS

## 2021-02-18 MED ORDER — MIDAZOLAM HCL 2 MG/2ML IJ SOLN
INTRAMUSCULAR | Status: AC
  Filled 2021-02-18: qty 2

## 2021-02-18 MED ORDER — AMISULPRIDE (ANTIEMETIC) 5 MG/2ML IV SOLN: 10.0000 mg | Freq: Once | INTRAVENOUS | Status: DC | PRN

## 2021-02-18 MED ORDER — PHENYLEPHRINE 40 MCG/ML (10ML) SYRINGE FOR IV PUSH (FOR BLOOD PRESSURE SUPPORT)
PREFILLED_SYRINGE | INTRAVENOUS | Status: DC | PRN
  Administered 2021-02-18: 80 ug via INTRAVENOUS

## 2021-02-18 MED ORDER — CHLORHEXIDINE GLUCONATE 0.12 % MT SOLN
OROMUCOSAL | Status: AC
  Filled 2021-02-18: qty 15

## 2021-02-18 MED ORDER — VANCOMYCIN HCL 1000 MG IV SOLR
INTRAVENOUS | Status: DC | PRN
  Administered 2021-02-18: 1000 mg

## 2021-02-18 MED ORDER — ACETAMINOPHEN 500 MG PO TABS: 1000.0000 mg | ORAL_TABLET | Freq: Once | ORAL | Status: DC

## 2021-02-18 MED ORDER — ONDANSETRON HCL 4 MG PO TABS: 4.0000 mg | ORAL_TABLET | Freq: Four times a day (QID) | ORAL | Status: DC | PRN

## 2021-02-18 MED ORDER — SCOPOLAMINE 1 MG/3DAYS TD PT72
MEDICATED_PATCH | TRANSDERMAL | Status: AC
  Filled 2021-02-18: qty 1

## 2021-02-18 MED ORDER — SODIUM CHLORIDE 0.9 % IR SOLN
Status: DC | PRN
  Administered 2021-02-18: 3000 mL

## 2021-02-18 SURGICAL SUPPLY — 39 items
BAG COUNTER SPONGE SURGICOUNT (BAG) IMPLANT
BLADE SURG 21 STRL SS (BLADE) ×2 IMPLANT
BNDG COHESIVE 6X5 TAN STRL LF (GAUZE/BANDAGES/DRESSINGS) IMPLANT
BNDG GAUZE ELAST 4 BULKY (GAUZE/BANDAGES/DRESSINGS) ×4 IMPLANT
CANISTER WOUNDNEG PRESSURE 500 (CANNISTER) ×1 IMPLANT
CASSETTE VERAFLO VERALINK (MISCELLANEOUS) ×1 IMPLANT
COVER SURGICAL LIGHT HANDLE (MISCELLANEOUS) ×4 IMPLANT
DERMABOND ADVANCED (GAUZE/BANDAGES/DRESSINGS) ×2
DERMABOND ADVANCED .7 DNX12 (GAUZE/BANDAGES/DRESSINGS) IMPLANT
DRAPE DERMATAC (DRAPES) ×2 IMPLANT
DRAPE U-SHAPE 47X51 STRL (DRAPES) ×2 IMPLANT
DRESSING VERAFLO CLEANS CC MED (GAUZE/BANDAGES/DRESSINGS) IMPLANT
DRSG ADAPTIC 3X8 NADH LF (GAUZE/BANDAGES/DRESSINGS) ×2 IMPLANT
DRSG VERAFLO CLEANSE CC MED (GAUZE/BANDAGES/DRESSINGS) ×6
DURAPREP 26ML APPLICATOR (WOUND CARE) ×2 IMPLANT
ELECT REM PT RETURN 9FT ADLT (ELECTROSURGICAL)
ELECTRODE REM PT RTRN 9FT ADLT (ELECTROSURGICAL) IMPLANT
GAUZE SPONGE 4X4 12PLY STRL (GAUZE/BANDAGES/DRESSINGS) ×2 IMPLANT
GLOVE SURG ORTHO LTX SZ9 (GLOVE) ×2 IMPLANT
GLOVE SURG UNDER POLY LF SZ9 (GLOVE) ×2 IMPLANT
GOWN STRL REUS W/ TWL XL LVL3 (GOWN DISPOSABLE) ×2 IMPLANT
GOWN STRL REUS W/TWL XL LVL3 (GOWN DISPOSABLE) ×4
HANDPIECE INTERPULSE COAX TIP (DISPOSABLE)
KIT BASIN OR (CUSTOM PROCEDURE TRAY) ×2 IMPLANT
KIT STIMULAN RAPID CURE  10CC (Orthopedic Implant) ×2 IMPLANT
KIT STIMULAN RAPID CURE 10CC (Orthopedic Implant) IMPLANT
KIT TURNOVER KIT B (KITS) ×2 IMPLANT
MANIFOLD NEPTUNE II (INSTRUMENTS) ×2 IMPLANT
NS IRRIG 1000ML POUR BTL (IV SOLUTION) ×2 IMPLANT
PACK ORTHO EXTREMITY (CUSTOM PROCEDURE TRAY) ×2 IMPLANT
PAD ARMBOARD 7.5X6 YLW CONV (MISCELLANEOUS) ×4 IMPLANT
SET HNDPC FAN SPRY TIP SCT (DISPOSABLE) IMPLANT
STOCKINETTE IMPERVIOUS 9X36 MD (GAUZE/BANDAGES/DRESSINGS) IMPLANT
SUT ETHILON 2 0 PSLX (SUTURE) ×2 IMPLANT
SWAB COLLECTION DEVICE MRSA (MISCELLANEOUS) ×2 IMPLANT
SWAB CULTURE ESWAB REG 1ML (MISCELLANEOUS) IMPLANT
TOWEL GREEN STERILE (TOWEL DISPOSABLE) ×2 IMPLANT
TUBE CONNECTING 12X1/4 (SUCTIONS) ×2 IMPLANT
YANKAUER SUCT BULB TIP NO VENT (SUCTIONS) ×2 IMPLANT

## 2021-02-18 NOTE — Progress Notes (Signed)
Per Dr Sharol Given, I changed the wound vac canister to eliminate any residual bubbles that may make the alarm beep as a blockage. I have massaged the site but to no avail. The vac continues to alarm blockage. Dr Sharol Given says he can adjust in the AM on rounds if the problem is not resolved with the above mentioned interventions.

## 2021-02-18 NOTE — Progress Notes (Signed)
Patient ID: [name], female   DOB: 1961/01/14, 60 y.o.   MRN: [id number]    Brief op note  Please refer to Dr. Jess Barters extensive note on this patient  Intraoperative consult: General surgery was again asked by Dr. Sharol Given to evaluate the left hip wound.  There was no evidence of disruption of the fascia or bowel involvement from the large wound tracking from the hip onto the abdominal wall musculature and fascia.  Wound care we will continue as outlined by Dr. Sharol Given

## 2021-02-18 NOTE — Interval H&P Note (Signed)
History and Physical Interval Note:  02/18/2021 6:46 AM  [name]  has presented today for surgery, with the diagnosis of Abscess Left Hip.  The various methods of treatment have been discussed with the patient and family. After consideration of risks, benefits and other options for treatment, the patient has consented to  Procedure(s): Repeat Debridement Left Hip Ulcer, apply VAC (Left) as a surgical intervention.  The patient's history has been reviewed, patient examined, no change in status, stable for surgery.  I have reviewed the patient's chart and labs.  Questions were answered to the patient's satisfaction.     [name]

## 2021-02-18 NOTE — Anesthesia Preprocedure Evaluation (Addendum)
Anesthesia Evaluation  Patient identified by MRN, date of birth, ID band Patient awake    Reviewed: Allergy & Precautions, NPO status , Patient's Chart, lab work & pertinent test results  Airway Mallampati: III  TM Distance: >3 FB Neck ROM: Full    Dental  (+) Missing, Dental Advisory Given, Edentulous Upper,    Pulmonary shortness of breath and with exertion, asthma , sleep apnea (no CPAP) , COPD (2-3L when walking),  COPD inhaler and oxygen dependent, former smoker,  Quit smoking 2015, 35 pack year history    Pulmonary exam normal breath sounds clear to auscultation       Cardiovascular hypertension, Pt. on medications and Pt. on home beta blockers +CHF  Normal cardiovascular exam+ dysrhythmias Atrial Fibrillation + Valvular Problems/Murmurs (s/p AVR, MVR 2016- moderate prosthetic mismatch of MV, unable to determine if paravalvular leak)  Rhythm:Regular Rate:Normal  Echo 2021: 1. Left ventricular ejection fraction, by estimation, is 55 to 60%. The  left ventricle has normal function. The left ventricle has no regional  wall motion abnormalities. Left ventricular diastolic function could not  be evaluated.  2. Right ventricular systolic function is normal. The right ventricular  size is mildly enlarged. There is mildly elevated pulmonary artery  systolic pressure. The estimated right ventricular systolic pressure is  59.5 mmHg.  3. Left atrial size was mildly dilated.  4. 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 Mean gradient 10.7 mmHG at  67 bpm. EOA 1.35 cm2 (0.58 cm/m2). P 1/2 93 msec. Peak E 2.3 m/s. Overall,  likely severe patient-prosthesis mismatch is present. . The mitral valve  has been repaired/replaced. No  evidence of mitral valve regurgitation. There is a 25 mm mechanical valve  present in the mitral position. Procedure Date: 08/06/2014.  5. 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 Vmax 3.1 m/s, mean gradient   20 mmHG, DI 0.48, EOA 1.67 cm2 (0.72 cm/m2). Suspect moderate  patient-prosthesis mismatch. Difficult to determine if there is  paravalvular leak as reported on prior  echocardiograms, TEE recommended to clarify. The aortic valve has been repaired/replaced. Aortic valve regurgitation is not visualized. There is a 19 mm mechanical valve present in the aortic position. Procedure Date: 08/06/2014.  6. The inferior vena cava is normal in size with greater than 50% respiratory variability, suggesting right atrial pressure of 3 mmHg.    Neuro/Psych PSYCHIATRIC DISORDERS Anxiety Depression negative neurological ROS     GI/Hepatic Neg liver ROS, Gastric sleeve resection    Endo/Other  Morbid obesityBMI 45  Renal/GU Renal diseaseCr 2.07  negative genitourinary   Musculoskeletal  (+) Arthritis , Osteoarthritis,  Abscess L hip   Abdominal (+) + obese,   Peds  Hematology  (+) Blood dyscrasia, anemia , hct 27.4   Anesthesia Other Findings   Reproductive/Obstetrics negative OB ROS                            Anesthesia Physical Anesthesia Plan  ASA: 4  Anesthesia Plan: General   Post-op Pain Management:    Induction: Intravenous  PONV Risk Score and Plan: 3 and Ondansetron, Dexamethasone, Midazolam, Treatment may vary due to age or medical condition, Scopolamine patch - Pre-op and Diphenhydramine  Airway Management Planned: LMA  Additional Equipment: None  Intra-op Plan:   Post-operative Plan: Extubation in OR  Informed Consent: I have reviewed the patients History and Physical, chart, labs and discussed the procedure including the risks, benefits and alternatives for the proposed anesthesia with  the patient or authorized representative who has indicated his/her understanding and acceptance.     Dental advisory given  Plan Discussed with: CRNA  Anesthesia Plan Comments: (Some nausea after last procedure on 9/16)       Anesthesia Quick  Evaluation

## 2021-02-18 NOTE — Anesthesia Postprocedure Evaluation (Signed)
Anesthesia Post Note  Patient: [name]  Procedure(s) Performed: Repeat Debridement Left Hip Ulcer, apply VAC (Left)     Patient location during evaluation: PACU Anesthesia Type: General Level of consciousness: awake and alert, oriented and patient cooperative Pain management: pain level controlled Vital Signs Assessment: post-procedure vital signs reviewed and stable Respiratory status: spontaneous breathing, nonlabored ventilation and respiratory function stable Cardiovascular status: blood pressure returned to baseline and stable Postop Assessment: no apparent nausea or vomiting Anesthetic complications: no   No notable events documented.  Last Vitals:  Vitals:   02/18/21 1438 02/18/21 1512  BP:  118/67  Pulse: 60 96  Resp: (!) 24 20  Temp:  36.6 C  SpO2: 91% 100%    Last Pain:  Vitals:   02/18/21 1512  TempSrc: Oral  PainSc:                  Pervis Hocking

## 2021-02-18 NOTE — Anesthesia Procedure Notes (Signed)
Procedure Name: LMA Insertion Date/Time: 02/18/2021 12:30 PM Performed by: Trinna Post., CRNA Pre-anesthesia Checklist: Patient identified, Emergency Drugs available, Suction available, Patient being monitored and Timeout performed Patient Re-evaluated:Patient Re-evaluated prior to induction Oxygen Delivery Method: Circle system utilized Preoxygenation: Pre-oxygenation with 100% oxygen Induction Type: IV induction Ventilation: Mask ventilation without difficulty LMA: LMA inserted LMA Size: 4.0 Number of attempts: 1 Placement Confirmation: positive ETCO2 Tube secured with: Tape Dental Injury: Teeth and Oropharynx as per pre-operative assessment

## 2021-02-18 NOTE — Transfer of Care (Signed)
Immediate Anesthesia Transfer of Care Note  Patient: [name]  Procedure(s) Performed: Repeat Debridement Left Hip Ulcer, apply VAC (Left)  Patient Location: PACU  Anesthesia Type:General  Level of Consciousness: awake, alert  and oriented  Airway & Oxygen Therapy: Patient Spontanous Breathing and Patient connected to face mask oxygen  Post-op Assessment: Report given to RN and Post -op Vital signs reviewed and stable  Post vital signs: Reviewed and stable  Last Vitals:  Vitals Value Taken Time  BP 100/68 02/18/21 1326  Temp    Pulse 110 02/18/21 1327  Resp 16 02/18/21 1327  SpO2 95 % 02/18/21 1327  Vitals shown include unvalidated device data.  Last Pain:  Vitals:   02/18/21 1016  TempSrc: Oral  PainSc:       Patients Stated Pain Goal: 0 (47/09/62 8366)  Complications: No notable events documented.

## 2021-02-18 NOTE — Op Note (Signed)
02/18/2021  1:37 PM  PATIENT:  [name]    PRE-OPERATIVE DIAGNOSIS:  Abscess Left Hip  POST-OPERATIVE DIAGNOSIS:  Same  PROCEDURE:  Repeat Debridement Left Hip Ulcer,  apply VAC with installation therapy Apply antibiotic beads with 1 g vancomycin and 240 mg gentamicin  SURGEON:  [name], MD  Surgeon: Nedra Hai, MD ANESTHESIA:   General  PREOPERATIVE INDICATIONS:  [name] is a  60 y.o. female with a diagnosis of Abscess Left Hip who failed conservative measures and elected for surgical management.    The risks benefits and alternatives were discussed with the patient preoperatively including but not limited to the risks of infection, bleeding, nerve injury, cardiopulmonary complications, the need for revision surgery, among others, and the patient was willing to proceed.  OPERATIVE IMPLANTS: Praveena cleanse choice sponges, blue, x3. Stimulant beads 10 cc with 1 g vancomycin and 240 mg gentamicin. Installation wound VAC with half-strength Dakin solution  @ENCIMAGES @  OPERATIVE FINDINGS: Significantly improved granulation tissue along the muscle belly.  Patient still had a purulent abscess, further dissection showed no further pockets of purulence.  OPERATIVE PROCEDURE: Patient was brought the operating room and underwent a general anesthetic.  After adequate levels anesthesia were obtained patient's left iliac crest was prepped using Betadine paint draped into a sterile field a timeout was called.  The previous retention sutures were removed there was purulent drainage the antibiotic beads were removed the sponges were removed the wound was irrigated with pulsatile lavage 3 L of normal saline.  General surgery performed further dissection between the muscle bellies and there was no further abscess identified.  Further resections of the superficial and deep oblique muscles showed healthy viable muscle tissue no necrotizing fasciitis.  Antibiotic beads were  placed within the deep aspect of the wound 3 of the cleanse choice sponges were packed in the wound.  The wound was reapproximated with 2-0 nylon leaving a gap of approximately 2 cm.  The wound was then outlined with derma tack hooked to suction this had a good suction fit installation therapy was started with 30 cc of half-strength Dakin solution.  Patient was extubated taken the PACU in stable condition.  Debridement type: Excisional Debridement  Side: left  Body Location: iliac crest   Tools used for debridement: scalpel and rongeur  Pre-debridement Wound size (cm):   Length: 15        Width: 5     Depth: 6   Post-debridement Wound size (cm):   Length: 15        Width: 5     Depth: 9   Debridement depth beyond dead/damaged tissue down to healthy viable tissue: yes  Tissue layer involved: skin, subcutaneous tissue, muscle / fascia, bone  Nature of tissue removed: Slough, Nutritional therapist, Non-viable tissue, and Purulence  Irrigation volume: 3 liters     Irrigation fluid type: Normal Saline     DISCHARGE PLANNING:  Antibiotic duration: Continue IV antibiotics all cultures have been positive for Serratia  Weightbearing: Bed rest  Pain medication: Opioid pathway  Dressing care/ Wound VAC: Installation wound VAC therapy  Ambulatory devices: Bedrest  Discharge to: Return to the operating room on Friday.  Follow-up: In the office 1 week post operative.

## 2021-02-19 ENCOUNTER — Other Ambulatory Visit: Payer: Self-pay | Admitting: Physician Assistant

## 2021-02-19 ENCOUNTER — Encounter (HOSPITAL_COMMUNITY): Payer: Self-pay | Admitting: Orthopedic Surgery

## 2021-02-19 LAB — CBC
HCT: 26.7 % — ABNORMAL LOW (ref 36.0–46.0)
Hemoglobin: 8.1 g/dL — ABNORMAL LOW (ref 12.0–15.0)
MCH: 30 pg (ref 26.0–34.0)
MCHC: 30.3 g/dL (ref 30.0–36.0)
MCV: 98.9 fL (ref 80.0–100.0)
Platelets: 244 10*3/uL (ref 150–400)
RBC: 2.7 MIL/uL — ABNORMAL LOW (ref 3.87–5.11)
RDW: 17.2 % — ABNORMAL HIGH (ref 11.5–15.5)
WBC: 10 10*3/uL (ref 4.0–10.5)
nRBC: 0 % (ref 0.0–0.2)

## 2021-02-19 LAB — PROTIME-INR
INR: 1.2 (ref 0.8–1.2)
Prothrombin Time: 15.7 seconds — ABNORMAL HIGH (ref 11.4–15.2)

## 2021-02-19 NOTE — Evaluation (Signed)
Physical Therapy Evaluation Patient Details Name: [name] MRN: [id number] DOB: 1960-12-25 Today's Date: 02/19/2021  History of Present Illness  60 y.o. 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 Due to the pt's Coumadin she has had continual increased drainage from the wound and currently has a large amount of purulent drainage. Repeat debridement L hip ulcer 9/14, 9/16, 9/21; planned 9/23. 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  Clinical Impression   Patient received sitting at EOB with OT present- a bit confused and impulsive today. Very easily distracted with poor insight into safety or deficits, and needed multimodal cues and environment carefully controlled to prevent a fall/maintain safety. Attempted to get orthostatics but she was unable to keep her arm still so doubt values were accurate; HR as much as 122bpm and O2 down to as low as 86% even on supplemental O2. At one point needed max intervention to prevent a fall due to LOB trying to wipe herself after finishing on BSC. Left in bed with all needs met, bed alarm active.   Per chart and OT who is familiar with her case, cognition, safety, and physical performance were VERY different today. For now, recommending SNF and 24/7A unless these factors improve significantly after surgical POC is complete.        Recommendations for follow up therapy are one component of a multi-disciplinary discharge planning process, led by the attending physician.  Recommendations may be updated based on patient status, additional functional criteria and insurance authorization.  Follow Up Recommendations SNF;Supervision/Assistance - 24 hour;Other (comment) (unless cognition/safety improve SIGNIFICANTLY after she has completed surgical POC)    Equipment Recommendations  Rolling walker with 5" wheels;3in1 (PT)    Recommendations for Other  Services       Precautions / Restrictions Precautions Precautions: Fall Precaution Comments: wound vac, acute confusion Restrictions Weight Bearing Restrictions: Yes LLE Weight Bearing: Weight bearing as tolerated      Mobility  Bed Mobility               General bed mobility comments: at EOB with OT upon entry    Transfers Overall transfer level: Needs assistance Equipment used: Rolling walker (2 wheeled) Transfers: Sit to/from Stand Sit to Stand: Min assist;+2 physical assistance Stand pivot transfers: Min assist;+2 physical assistance       General transfer comment: for safety and line management, also to manage impulsive behavior and maintain safe environment  Ambulation/Gait             General Gait Details: deferred- HR to 122 (max observed) and SPO2 dropping into 80s even on supplemental O2 (but pleth not great)  Stairs            Wheelchair Mobility    Modified Rankin (Stroke Patients Only)       Balance Overall balance assessment: Needs assistance Sitting-balance support: Feet supported Sitting balance-Leahy Scale: Poor Sitting balance - Comments: posterior lean, up to MinAt to maintain upright and difficulty correcting   Standing balance support: Bilateral upper extremity supported;During functional activity Standing balance-Leahy Scale: Poor Standing balance comment: unsteady even with BUE support, impulsivity making her a large fall risk                             Pertinent Vitals/Pain Pain Assessment: Faces Faces Pain Scale: Hurts a little bit Pain Location: L hip Pain Descriptors / Indicators:  Sore Pain Intervention(s): Limited activity within patient's tolerance;Monitored during session    [place] expects to be discharged to:: Private residence Living Arrangements: Other relatives Available Help at Discharge: Family;Available 24 hours/day Type of Home: Apartment Home Access: Level entry      Home Layout: One level Home Equipment: Walker - 2 wheels;Walker - standard;Other (comment) (2LPM home O2) Additional Comments: on chronic oxygen- "drops quickly/goes back up quickly". Has a concentrator just in case but does not check regularly    Prior Function Level of Independence: Independent         Comments: I with ADLs/IADLs including driving and grocery shopping. Reports owning a RW but does not rely on it at baseline. On unemployment 2/2 to Rt LE lymphedema     Hand Dominance   Dominant Hand: Right    Extremity/Trunk Assessment   Upper Extremity Assessment Upper Extremity Assessment: Defer to OT evaluation    Lower Extremity Assessment Lower Extremity Assessment: Generalized weakness    Cervical / Trunk Assessment Cervical / Trunk Assessment: Kyphotic Cervical / Trunk Exceptions: Large body habitus  Communication   Communication: No difficulties  Cognition Arousal/Alertness: Awake/alert Behavior During Therapy: Impulsive;Flat affect;Restless Overall Cognitive Status: Impaired/Different from baseline Area of Impairment: Orientation;Attention;Memory;Following commands;Safety/judgement;Awareness;Problem solving                 Orientation Level: Disoriented to;Situation Current Attention Level: Sustained Memory: Decreased short-term memory;Decreased recall of precautions Following Commands: Follows one step commands inconsistently;Follows one step commands with increased time Safety/Judgement: Decreased awareness of safety;Decreased awareness of deficits Awareness: Intellectual Problem Solving: Slow processing;Decreased initiation;Difficulty sequencing;Requires tactile cues;Requires verbal cues General Comments: cognition very different than previous sessions- very easily distracted and impulsive, also with slow processing time and no insight into safety awareness. Needed repeated, multimodal cues from therapist to maintain safety/prevent a fall       General Comments      Exercises     Assessment/Plan    PT Assessment Patient needs continued PT services  PT Problem List Decreased strength;Decreased mobility;Decreased activity tolerance       PT Treatment Interventions Gait training;Functional mobility training;Therapeutic activities;Patient/family education;Therapeutic exercise;DME instruction;Stair training;Cognitive remediation    PT Goals (Current goals can be found in the Care Plan section)  Acute Rehab PT Goals Patient Stated Goal: wound vac to seal PT Goal Formulation: With patient Time For Goal Achievement: 03/05/21 Potential to Achieve Goals: Fair    Frequency Min 3X/week   Barriers to discharge        Co-evaluation               AM-PAC PT "6 Clicks" Mobility  Outcome Measure Help needed turning from your back to your side while in a flat bed without using bedrails?: A Little Help needed moving from lying on your back to sitting on the side of a flat bed without using bedrails?: A Little Help needed moving to and from a bed to a chair (including a wheelchair)?: A Lot Help needed standing up from a chair using your arms (e.g., wheelchair or bedside chair)?: A Little Help needed to walk in hospital room?: A Lot Help needed climbing 3-5 steps with a railing? : Total 6 Click Score: 14    End of Session   Activity Tolerance: Patient tolerated treatment well Patient left: in bed;with call bell/phone within reach;with bed alarm set Nurse Communication: Mobility status PT Visit Diagnosis: Difficulty in walking, not elsewhere classified (R26.2);Muscle weakness (generalized) (M62.81);Unsteadiness on feet (R26.81)    Time:  7921-7837 PT Time Calculation (min) (ACUTE ONLY): 27 min   Charges:   PT Evaluation $PT Re-evaluation: 1 Re-eval (co-tx with OT)         Ann Lions PT, DPT, PN2   Supplemental Physical Therapist [employer]    Pager [phone] Acute Rehab Office [phone]

## 2021-02-19 NOTE — H&P (View-Only) (Signed)
Patient ID: [name], female   DOB: 10-15-1960, 60 y.o.   MRN: [id number] Patient is postoperative day 1 repeat debridement of left hip infection.  General surgery assisted with the evaluation.  There is improved granulation tissue on the muscle bellies which were previously necrotic.  There is still purulence within the wound.  Patient was placed on repeat instillation wound VAC therapy.  I changed the canister and installation this morning.  The suction canister was full.  There is intermittent blockage alarms however there is drainage from the tube.  Plan to return to the operating room tomorrow anticipate wound closure if no further purulence.

## 2021-02-19 NOTE — Progress Notes (Addendum)
Occupational Therapy Re-eval Patient Details Name: [name] MRN: [id number] DOB: 1960-12-16 Today's Date: 02/19/2021   History of present illness 60 y.o. 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 Due to the pt's Coumadin she has had continual increased drainage from the wound and currently has a large amount of purulent drainage. Repeat debridement L hip ulcer 9/14, 9/16, 9/21; planned 9/23. 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   OT comments  Pt seen post debridement of L hip.  Pt supine in bed and agreeable to OT session, min assist for bed mobility.  Patient oriented to month/year but requires re-orientation to day.  Significantly slower processing, poor attention, awareness, recall and safety throughout session requiring max redirection and cueing for engagement in tasks.  Requires min assist +2 for toilet transfer and toileting, with assist to prevent at fall during toileting due to LOB.   She reports mild dizziness at EOB, but unreliable reading on BP due to pt moving her arm.  SpO2 on RA fluctuating, improved with 2L but continues to drop into 86s without cueing for PLB, HR up to 122. Overall pt presents very differently to last session, pt even endorses 'feeling off", voicing "I get worse after every surgery"; and unable to count backwards from 20 (accurately or attend to complete task). RN notified of cognition changes and +2 assist to [facility] for safety. Based on performance today, recommend SNF rehab and 24/7 assist.  Will continue to follow, update recommendations as appropriate.    Recommendations for follow up therapy are one component of a multi-disciplinary discharge planning process, led by the attending physician.  Recommendations may be updated based on patient status, additional functional criteria and insurance authorization.    Follow Up Recommendations   SNF;Supervision/Assistance - 24 hour    Equipment Recommendations  3 in 1 bedside commode    Recommendations for Other Services      Precautions / Restrictions Precautions Precautions: Fall Precaution Comments: wound vac, acute confusion Restrictions Weight Bearing Restrictions: Yes LLE Weight Bearing: Weight bearing as tolerated       Mobility Bed Mobility Overal bed mobility: Needs Assistance Bed Mobility: Supine to Sit     Supine to sit: Min assist     General bed mobility comments: min assist to ascend trunk, increased time and effort with cueing to sustain attention to complete task    Transfers Overall transfer level: Needs assistance Equipment used: Rolling walker (2 wheeled) Transfers: Sit to/from [facility] Sit to Stand: Min assist;+2 physical assistance Stand pivot transfers: Min assist;+2 physical assistance       General transfer comment: for safety and line management, also to manage impulsive behavior and maintain safe environment    Balance Overall balance assessment: Needs assistance Sitting-balance support: Feet supported Sitting balance-Leahy Scale: Poor Sitting balance - Comments: posterior lean, up to MinAt to maintain upright and difficulty correcting   Standing balance support: Bilateral upper extremity supported;During functional activity Standing balance-Leahy Scale: Poor Standing balance comment: unsteady even with BUE support, impulsivity making her a large fall risk                           ADL either performed or assessed with clinical judgement   ADL Overall ADL's : Needs assistance/impaired                     Lower  Body Dressing: Minimal assistance;+2 for safety/equipment;Sit to/from stand   Toilet Transfer: +2 for safety/equipment;BSC;RW;Stand-pivot;Minimal assistance;+2 for physical assistance   Toileting- Clothing Manipulation and Hygiene: +2 for safety/equipment;Sit to/from  stand;Minimal assistance;+2 for physical assistance Toileting - Clothing Manipulation Details (indicate cue type and reason): pt using warm wash cloth in standing, loosing balance and requires therapist assist to prevent fall     Functional mobility during ADLs: +2 for safety/equipment;Rolling walker;Cueing for safety;Cueing for sequencing;Minimal assistance;+2 for physical assistance General ADL Comments: limited by balance, weakness and confusion     Vision       Perception     Praxis      Cognition Arousal/Alertness: Awake/alert Behavior During Therapy: Impulsive;Flat affect;Restless Overall Cognitive Status: Impaired/Different from baseline Area of Impairment: Orientation;Attention;Memory;Following commands;Safety/judgement;Awareness;Problem solving                 Orientation Level: Disoriented to;Situation Current Attention Level: Focused Memory: Decreased recall of precautions;Decreased short-term memory Following Commands: Follows one step commands inconsistently;Follows one step commands with increased time Safety/Judgement: Decreased awareness of safety;Decreased awareness of deficits Awareness: Intellectual Problem Solving: Slow processing;Decreased initiation;Difficulty sequencing;Requires verbal cues;Requires tactile cues General Comments: cognition very different than previous sessions- very easily distracted and impulsive, also with slow processing time and no insight into safety awareness. Needed repeated, multimodal cues from therapist to maintain safety/prevent a fall. Pt unable to sustain attention to count backwards from 20, poor recall of day during session after re-oriented to thursday (reports wed)        Exercises     Shoulder Instructions       General Comments O2 fluctating on RA between 88-92, donned at 2L to maintain and even dropping to 80s requiring cueing for PLB; max HR 122 on BSC    Pertinent Vitals/ Pain       Pain Assessment:  0-10 Pain Score: 4  Faces Pain Scale: Hurts a little bit Pain Location: L hip Pain Descriptors / Indicators: Sore Pain Intervention(s): Limited activity within patient's tolerance;Monitored during session;Repositioned  Home Living Family/patient expects to be discharged to:: Private residence Living Arrangements: Other relatives Available Help at Discharge: Family;Available 24 hours/day Type of Home: Apartment Home Access: Level entry     Home Layout: One level     Bathroom Shower/Tub: Teacher, early years/pre: Standard Bathroom Accessibility: Yes   Home Equipment: Walker - 2 wheels;Walker - standard;Other (comment) (2LPM home O2)   Additional Comments: on chronic oxygen- "drops quickly/goes back up quickly". Has a concentrator just in case but does not check regularly      Prior Functioning/Environment Level of Independence: Independent        Comments: I with ADLs/IADLs including driving and grocery shopping. Reports owning a RW but does not rely on it at baseline. On unemployment 2/2 to Rt LE lymphedema   Frequency  Min 2X/week        Progress Toward Goals  OT Goals(current goals can now be found in the care plan section)  Progress towards OT goals: Not progressing toward goals - comment (increasd confusion)  Acute Rehab OT Goals Patient Stated Goal: wound vac to seal OT Goal Formulation: With patient Time For Goal Achievement: 03/05/21 Potential to Achieve Goals: Good  Plan Discharge plan needs to be updated;Frequency remains appropriate    Co-evaluation                 AM-PAC OT "6 Clicks" Daily Activity     Outcome Measure   Help from another person  eating meals?: A Little Help from another person taking care of personal grooming?: A Little Help from another person toileting, which includes using toliet, bedpan, or urinal?: A Little Help from another person bathing (including washing, rinsing, drying)?: A Little Help from another  person to put on and taking off regular upper body clothing?: A Little Help from another person to put on and taking off regular lower body clothing?: A Little 6 Click Score: 18    End of Session Equipment Utilized During Treatment: Rolling walker;Oxygen  OT Visit Diagnosis: Unsteadiness on feet (R26.81)   Activity Tolerance Patient tolerated treatment well   Patient Left in bed;with call bell/phone within reach;with bed alarm set;with SCD's reapplied   Nurse Communication Mobility status;Other (comment) (acute confusion)        Time: 1241-1330 OT Time Calculation (min): 49 min  Charges: OT General Charges $OT Visit: 1 Visit OT Evaluation $OT Re-eval: 1 Re-eval OT Treatments $Self Care/Home Management : 8-22 mins  Jolaine Artist, OT Acute Rehabilitation Services Pager [phone] Office [phone]   [name] 02/19/2021, 2:04 PM

## 2021-02-19 NOTE — Progress Notes (Signed)
Patient ID: [name], female   DOB: [birth year], 60 y.o.   MRN: [id number] Patient is postoperative day 1 repeat debridement of left hip infection.  General surgery assisted with the evaluation.  There is improved granulation tissue on the muscle bellies which were previously necrotic.  There is still purulence within the wound.  Patient was placed on repeat instillation wound VAC therapy.  I changed the canister and installation this morning.  The suction canister was full.  There is intermittent blockage alarms however there is drainage from the tube.  Plan to return to the operating room tomorrow anticipate wound closure if no further purulence.

## 2021-02-20 ENCOUNTER — Encounter (HOSPITAL_COMMUNITY): Payer: Self-pay | Admitting: Orthopedic Surgery

## 2021-02-20 ENCOUNTER — Encounter (HOSPITAL_COMMUNITY)
Admission: RE | Disposition: A | Discharge status: Home Health | Payer: Self-pay | Source: Home / Self Care | Attending: Orthopedic Surgery

## 2021-02-20 ENCOUNTER — Inpatient Hospital Stay (HOSPITAL_COMMUNITY): Admission: RE | Admit: 2021-02-20 | Payer: Medicare Other | Source: Home / Self Care | Admitting: Orthopedic Surgery

## 2021-02-20 ENCOUNTER — Inpatient Hospital Stay (HOSPITAL_COMMUNITY): Payer: Medicare Other | Admitting: Anesthesiology

## 2021-02-20 DIAGNOSIS — M86152 Other acute osteomyelitis, left femur: Secondary | ICD-10-CM | POA: Diagnosis not present

## 2021-02-20 HISTORY — PX: I & D EXTREMITY: SHX5045

## 2021-02-20 LAB — BASIC METABOLIC PANEL
Anion gap: 5 (ref 5–15)
BUN: 35 mg/dL — ABNORMAL HIGH (ref 8–23)
CO2: 20 mmol/L — ABNORMAL LOW (ref 22–32)
Calcium: 12 mg/dL — ABNORMAL HIGH (ref 8.9–10.3)
Chloride: 114 mmol/L — ABNORMAL HIGH (ref 98–111)
Creatinine, Ser: 2.44 mg/dL — ABNORMAL HIGH (ref 0.44–1.00)
GFR, Estimated: 22 mL/min — ABNORMAL LOW (ref >60)
Glucose, Bld: 83 mg/dL (ref 70–99)
Potassium: 4.3 mmol/L (ref 3.5–5.1)
Sodium: 139 mmol/L (ref 135–145)

## 2021-02-20 LAB — CBC
HCT: 26.9 % — ABNORMAL LOW (ref 36.0–46.0)
Hemoglobin: 8.2 g/dL — ABNORMAL LOW (ref 12.0–15.0)
MCH: 30 pg (ref 26.0–34.0)
MCHC: 30.5 g/dL (ref 30.0–36.0)
MCV: 98.5 fL (ref 80.0–100.0)
Platelets: 236 10*3/uL (ref 150–400)
RBC: 2.73 MIL/uL — ABNORMAL LOW (ref 3.87–5.11)
RDW: 16.9 % — ABNORMAL HIGH (ref 11.5–15.5)
WBC: 7.3 10*3/uL (ref 4.0–10.5)
nRBC: 0 % (ref 0.0–0.2)

## 2021-02-20 LAB — PROTIME-INR
INR: 1.3 — ABNORMAL HIGH (ref 0.8–1.2)
Prothrombin Time: 16.2 seconds — ABNORMAL HIGH (ref 11.4–15.2)

## 2021-02-20 SURGERY — IRRIGATION AND DEBRIDEMENT EXTREMITY
Anesthesia: General | Laterality: Left

## 2021-02-20 MED ORDER — PROPOFOL 10 MG/ML IV BOLUS
INTRAVENOUS | Status: AC
  Filled 2021-02-20: qty 20

## 2021-02-20 MED ORDER — BISACODYL 10 MG RE SUPP: 10.0000 mg | Freq: Every day | RECTAL | Status: DC | PRN

## 2021-02-20 MED ORDER — ONDANSETRON HCL 4 MG PO TABS: 4.0000 mg | ORAL_TABLET | Freq: Four times a day (QID) | ORAL | Status: DC | PRN

## 2021-02-20 MED ORDER — METHOCARBAMOL 500 MG PO TABS
500.0000 mg | ORAL_TABLET | Freq: Four times a day (QID) | ORAL | Status: DC | PRN
  Administered 2021-02-23: 500 mg via ORAL
  Filled 2021-02-20 (×2): qty 1

## 2021-02-20 MED ORDER — ORAL CARE MOUTH RINSE: 15.0000 mL | Freq: Once | OROMUCOSAL | Status: AC

## 2021-02-20 MED ORDER — EPHEDRINE 5 MG/ML INJ
INTRAVENOUS | Status: AC
  Filled 2021-02-20: qty 10

## 2021-02-20 MED ORDER — METHOCARBAMOL 1000 MG/10ML IJ SOLN
500.0000 mg | Freq: Four times a day (QID) | INTRAVENOUS | Status: DC | PRN
  Filled 2021-02-20: qty 5

## 2021-02-20 MED ORDER — SCOPOLAMINE 1 MG/3DAYS TD PT72
MEDICATED_PATCH | TRANSDERMAL | Status: AC
  Filled 2021-02-20: qty 1

## 2021-02-20 MED ORDER — MIDAZOLAM HCL 5 MG/5ML IJ SOLN
INTRAMUSCULAR | Status: DC | PRN
  Administered 2021-02-20: 2 mg via INTRAVENOUS

## 2021-02-20 MED ORDER — TRANEXAMIC ACID-NACL 1000-0.7 MG/100ML-% IV SOLN: 1000.0000 mg | Freq: Once | INTRAVENOUS | Status: DC

## 2021-02-20 MED ORDER — METOPROLOL TARTRATE 5 MG/5ML IV SOLN
INTRAVENOUS | Status: AC
  Filled 2021-02-20: qty 5

## 2021-02-20 MED ORDER — 0.9 % SODIUM CHLORIDE (POUR BTL) OPTIME
TOPICAL | Status: DC | PRN
  Administered 2021-02-20: 1000 mL

## 2021-02-20 MED ORDER — METOPROLOL TARTRATE 50 MG PO TABS
ORAL_TABLET | ORAL | Status: AC
  Filled 2021-02-20: qty 1

## 2021-02-20 MED ORDER — LACTATED RINGERS IV SOLN: INTRAVENOUS | Status: DC

## 2021-02-20 MED ORDER — MIDAZOLAM HCL 2 MG/2ML IJ SOLN
INTRAMUSCULAR | Status: AC
  Filled 2021-02-20: qty 2

## 2021-02-20 MED ORDER — EPHEDRINE SULFATE-NACL 50-0.9 MG/10ML-% IV SOSY
PREFILLED_SYRINGE | INTRAVENOUS | Status: DC | PRN
  Administered 2021-02-20: 10 mg via INTRAVENOUS

## 2021-02-20 MED ORDER — EPHEDRINE 5 MG/ML INJ
INTRAVENOUS | Status: AC
  Filled 2021-02-20: qty 5

## 2021-02-20 MED ORDER — SODIUM CHLORIDE 0.9 % IV SOLN: INTRAVENOUS | Status: DC | PRN

## 2021-02-20 MED ORDER — METOPROLOL TARTRATE 5 MG/5ML IV SOLN
INTRAVENOUS | Status: DC | PRN
  Administered 2021-02-20: 2 mg via INTRAVENOUS
  Administered 2021-02-20: 1 mg via INTRAVENOUS

## 2021-02-20 MED ORDER — DEXAMETHASONE SODIUM PHOSPHATE 4 MG/ML IJ SOLN: INTRAMUSCULAR | Status: DC | PRN

## 2021-02-20 MED ORDER — SODIUM CHLORIDE 0.9 % IV SOLN: INTRAVENOUS | Status: DC

## 2021-02-20 MED ORDER — CHLORHEXIDINE GLUCONATE 0.12 % MT SOLN: 15.0000 mL | Freq: Once | OROMUCOSAL | Status: AC

## 2021-02-20 MED ORDER — FENTANYL CITRATE (PF) 100 MCG/2ML IJ SOLN
INTRAMUSCULAR | Status: DC | PRN
  Administered 2021-02-20: 25 ug via INTRAVENOUS
  Administered 2021-02-20: 50 ug via INTRAVENOUS
  Administered 2021-02-20 (×3): 25 ug via INTRAVENOUS

## 2021-02-20 MED ORDER — PHENYLEPHRINE 40 MCG/ML (10ML) SYRINGE FOR IV PUSH (FOR BLOOD PRESSURE SUPPORT)
PREFILLED_SYRINGE | INTRAVENOUS | Status: AC
  Filled 2021-02-20: qty 10

## 2021-02-20 MED ORDER — FENTANYL CITRATE (PF) 250 MCG/5ML IJ SOLN
INTRAMUSCULAR | Status: AC
  Filled 2021-02-20: qty 5

## 2021-02-20 MED ORDER — METOCLOPRAMIDE HCL 5 MG PO TABS: 5.0000 mg | ORAL_TABLET | Freq: Three times a day (TID) | ORAL | Status: DC | PRN

## 2021-02-20 MED ORDER — ONDANSETRON HCL 4 MG/2ML IJ SOLN
INTRAMUSCULAR | Status: AC
  Filled 2021-02-20: qty 4

## 2021-02-20 MED ORDER — SCOPOLAMINE 1 MG/3DAYS TD PT72
1.0000 | MEDICATED_PATCH | TRANSDERMAL | Status: DC
  Administered 2021-02-20 – 2021-02-26 (×3): 1.5 mg via TRANSDERMAL
  Filled 2021-02-20 (×3): qty 1

## 2021-02-20 MED ORDER — PHENYLEPHRINE 40 MCG/ML (10ML) SYRINGE FOR IV PUSH (FOR BLOOD PRESSURE SUPPORT)
PREFILLED_SYRINGE | INTRAVENOUS | Status: DC | PRN
  Administered 2021-02-20: 80 ug via INTRAVENOUS
  Administered 2021-02-20: 120 ug via INTRAVENOUS
  Administered 2021-02-20: 80 ug via INTRAVENOUS
  Administered 2021-02-20 (×3): 160 ug via INTRAVENOUS

## 2021-02-20 MED ORDER — PROPOFOL 10 MG/ML IV BOLUS
INTRAVENOUS | Status: DC | PRN
  Administered 2021-02-20: 150 mg via INTRAVENOUS

## 2021-02-20 MED ORDER — DOCUSATE SODIUM 100 MG PO CAPS
100.0000 mg | ORAL_CAPSULE | Freq: Two times a day (BID) | ORAL | Status: DC
  Administered 2021-02-20 – 2021-03-03 (×18): 100 mg via ORAL
  Filled 2021-02-20 (×23): qty 1

## 2021-02-20 MED ORDER — ONDANSETRON HCL 4 MG/2ML IJ SOLN
4.0000 mg | Freq: Four times a day (QID) | INTRAMUSCULAR | Status: DC | PRN
  Administered 2021-02-22 – 2021-02-25 (×2): 4 mg via INTRAVENOUS
  Filled 2021-02-20 (×2): qty 2

## 2021-02-20 MED ORDER — POLYETHYLENE GLYCOL 3350 17 G PO PACK
17.0000 g | PACK | Freq: Every day | ORAL | Status: DC | PRN
  Administered 2021-02-21: 17 g via ORAL
  Filled 2021-02-20: qty 1

## 2021-02-20 MED ORDER — CHLORHEXIDINE GLUCONATE 0.12 % MT SOLN
OROMUCOSAL | Status: AC
  Administered 2021-02-20: 15 mL via OROMUCOSAL
  Filled 2021-02-20: qty 15

## 2021-02-20 MED ORDER — PHENYLEPHRINE 40 MCG/ML (10ML) SYRINGE FOR IV PUSH (FOR BLOOD PRESSURE SUPPORT)
PREFILLED_SYRINGE | INTRAVENOUS | Status: AC
  Filled 2021-02-20: qty 20

## 2021-02-20 MED ORDER — METOCLOPRAMIDE HCL 5 MG/ML IJ SOLN: 5.0000 mg | Freq: Three times a day (TID) | INTRAMUSCULAR | Status: DC | PRN

## 2021-02-20 SURGICAL SUPPLY — 33 items
BAG COUNTER SPONGE SURGICOUNT (BAG) IMPLANT
BLADE SURG 21 STRL SS (BLADE) ×2 IMPLANT
BNDG COHESIVE 6X5 TAN STRL LF (GAUZE/BANDAGES/DRESSINGS) IMPLANT
BNDG GAUZE ELAST 4 BULKY (GAUZE/BANDAGES/DRESSINGS) ×4 IMPLANT
COVER SURGICAL LIGHT HANDLE (MISCELLANEOUS) ×4 IMPLANT
DRAPE DERMATAC (DRAPES) ×2 IMPLANT
DRAPE U-SHAPE 47X51 STRL (DRAPES) ×2 IMPLANT
DRESSING PEEL AND PLAC PRVNA20 (GAUZE/BANDAGES/DRESSINGS) ×1 IMPLANT
DRSG ADAPTIC 3X8 NADH LF (GAUZE/BANDAGES/DRESSINGS) ×2 IMPLANT
DRSG PEEL AND PLACE PREVENA 20 (GAUZE/BANDAGES/DRESSINGS) ×2
DURAPREP 26ML APPLICATOR (WOUND CARE) ×2 IMPLANT
ELECT REM PT RETURN 9FT ADLT (ELECTROSURGICAL)
ELECTRODE REM PT RTRN 9FT ADLT (ELECTROSURGICAL) IMPLANT
GAUZE SPONGE 4X4 12PLY STRL (GAUZE/BANDAGES/DRESSINGS) ×2 IMPLANT
GLOVE SURG ORTHO LTX SZ9 (GLOVE) ×2 IMPLANT
GLOVE SURG UNDER POLY LF SZ9 (GLOVE) ×2 IMPLANT
GOWN STRL REUS W/ TWL XL LVL3 (GOWN DISPOSABLE) ×2 IMPLANT
GOWN STRL REUS W/TWL XL LVL3 (GOWN DISPOSABLE) ×4
HANDPIECE INTERPULSE COAX TIP (DISPOSABLE)
KIT BASIN OR (CUSTOM PROCEDURE TRAY) ×2 IMPLANT
KIT TURNOVER KIT B (KITS) ×2 IMPLANT
MANIFOLD NEPTUNE II (INSTRUMENTS) ×2 IMPLANT
NS IRRIG 1000ML POUR BTL (IV SOLUTION) ×2 IMPLANT
PACK ORTHO EXTREMITY (CUSTOM PROCEDURE TRAY) ×2 IMPLANT
PAD ARMBOARD 7.5X6 YLW CONV (MISCELLANEOUS) ×4 IMPLANT
SET HNDPC FAN SPRY TIP SCT (DISPOSABLE) IMPLANT
STOCKINETTE IMPERVIOUS 9X36 MD (GAUZE/BANDAGES/DRESSINGS) IMPLANT
SUT ETHILON 2 0 PSLX (SUTURE) ×2 IMPLANT
SWAB COLLECTION DEVICE MRSA (MISCELLANEOUS) ×2 IMPLANT
SWAB CULTURE ESWAB REG 1ML (MISCELLANEOUS) IMPLANT
TOWEL GREEN STERILE (TOWEL DISPOSABLE) ×2 IMPLANT
TUBE CONNECTING 12X1/4 (SUCTIONS) ×2 IMPLANT
YANKAUER SUCT BULB TIP NO VENT (SUCTIONS) ×2 IMPLANT

## 2021-02-20 NOTE — Interval H&P Note (Signed)
History and Physical Interval Note:  02/20/2021 7:08 AM  [name]  has presented today for surgery, with the diagnosis of Abscess Left Hip.  The various methods of treatment have been discussed with the patient and family. After consideration of risks, benefits and other options for treatment, the patient has consented to  Procedure(s): REPEAT DEBRIDEMENT LEFT HIP (Left) as a surgical intervention.  The patient's history has been reviewed, patient examined, no change in status, stable for surgery.  I have reviewed the patient's chart and labs.  Questions were answered to the patient's satisfaction.     [name]
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
## 2021-02-20 NOTE — Transfer of Care (Signed)
Immediate Anesthesia Transfer of Care Note  Patient: [name]  Procedure(s) Performed: REPEAT DEBRIDEMENT LEFT HIP (Left)  Patient Location: PACU  Anesthesia Type:General  Level of Consciousness: drowsy, lethargic and responds to stimulation  Airway & Oxygen Therapy: Patient Spontanous Breathing and Patient connected to nasal cannula oxygen  Post-op Assessment: Report given to RN and Post -op Vital signs reviewed and stable  Post vital signs: Reviewed and stable  Last Vitals:  Vitals Value Taken Time  BP 92/50 02/20/21 1209  Temp    Pulse 98 02/20/21 1212  Resp 19 02/20/21 1212  SpO2 98 % 02/20/21 1212  Vitals shown include unvalidated device data.  Last Pain:  Vitals:   02/20/21 1058  TempSrc:   PainSc: 5       Patients Stated Pain Goal: 3 (37/44/51 4604)  Complications: No notable events documented.

## 2021-02-20 NOTE — Op Note (Signed)
02/20/2021  12:20 PM  PATIENT:  [name]    PRE-OPERATIVE DIAGNOSIS:  Abscess Left Hip  POST-OPERATIVE DIAGNOSIS:  Same  PROCEDURE:  REPEAT DEBRIDEMENT LEFT HIP Local tissue rearrangement for wound closure 20 x 5 cm. Application of customizable wound VAC  SURGEON:  [name], MD  PHYSICIAN ASSISTANT:None ANESTHESIA:   General  PREOPERATIVE INDICATIONS:  [name] is a  60 y.o. female with a diagnosis of Abscess Left Hip who failed conservative measures and elected for surgical management.    The risks benefits and alternatives were discussed with the patient preoperatively including but not limited to the risks of infection, bleeding, nerve injury, cardiopulmonary complications, the need for revision surgery, among others, and the patient was willing to proceed.  OPERATIVE IMPLANTS: Customizable wound VAC  @ENCIMAGES @  OPERATIVE FINDINGS: All wound surfaces had 800% healthy granulation tissue no fibrinous exudate no purulent drainage.  OPERATIVE PROCEDURE: Patient was brought the operating room and underwent a general anesthetic.  After adequate levels anesthesia were obtained patient's right iliac crest was prepped using DuraPrep draped into a sterile field a timeout was called.  The sutures and sponges were removed.  There is excellent granulation tissue on all surface areas.  There is good Domes of granulation tissue cleanse choice wound VAC sponge.  The wound was irrigated with pulsatile lavage.  3 Penrose drains were then placed into the corners of the deep wound.  Local tissue rearrangement was used to close the wound 20 x 5 cm.  A Praveena customizable wound VAC was applied this was covered with derma tack this had a good suction fit patient was extubated taken the PACU in stable condition.  Debridement type: Excisional Debridement  Side: left  Body Location: iliac crest   Tools used for debridement: scalpel and rongeur  Pre-debridement Wound size  (cm):   Length: 19        Width: 5     Depth: 10   Post-debridement Wound size (cm):   Length: 20        Width: 5     Depth: 10   Debridement depth beyond dead/damaged tissue down to healthy viable tissue: yes  Tissue layer involved: skin, subcutaneous tissue, muscle / fascia  Nature of tissue removed: Non-viable tissue  Irrigation volume: 3 liters     Irrigation fluid type: Normal Saline     DISCHARGE PLANNING:  Antibiotic duration: Continue antibiotics  Weightbearing: Weightbearing as tolerated  Pain medication: Continue pain management  Dressing care/ Wound VAC: Continue wound VAC  Ambulatory devices: Walker  Discharge to: Discharge planning based on therapy recommendations.  Follow-up: In the office 1 week post operative.

## 2021-02-20 NOTE — Anesthesia Postprocedure Evaluation (Signed)
Anesthesia Post Note  Patient: [name]  Procedure(s) Performed: REPEAT DEBRIDEMENT LEFT HIP (Left)     Patient location during evaluation: PACU Anesthesia Type: General Level of consciousness: awake and alert, oriented and patient cooperative Pain management: pain level controlled Vital Signs Assessment: post-procedure vital signs reviewed and stable Respiratory status: spontaneous breathing, nonlabored ventilation and respiratory function stable Cardiovascular status: blood pressure returned to baseline and stable Postop Assessment: no apparent nausea or vomiting Anesthetic complications: no   No notable events documented.  Last Vitals:  Vitals:   02/20/21 1235 02/20/21 1245  BP: 93/68 101/65  Pulse: 80 90  Resp: (!) 26 (!) 22  Temp:  (!) 36.1 C  SpO2: 99% 100%    Last Pain:  Vitals:   02/20/21 1245  TempSrc:   PainSc: 0-No pain                 Pervis Hocking

## 2021-02-20 NOTE — Anesthesia Preprocedure Evaluation (Addendum)
Anesthesia Evaluation  Patient identified by MRN, date of birth, ID band Patient awake    Reviewed: Allergy & Precautions, NPO status , Patient's Chart, lab work & pertinent test results  Airway Mallampati: III  TM Distance: >3 FB Neck ROM: Full    Dental  (+) Missing, Dental Advisory Given, Edentulous Upper,    Pulmonary neg pulmonary ROS, shortness of breath and with exertion, asthma , COPD (2-3L when walking),  COPD inhaler and oxygen dependent, former smoker,  Quit smoking 2015, 35 pack year history    Pulmonary exam normal breath sounds clear to auscultation       Cardiovascular hypertension, Pt. on medications and Pt. on home beta blockers +CHF  Normal cardiovascular examAtrial Fibrillation + Valvular Problems/Murmurs (s/p AVR, MVR 2016- moderate prosthetic mismatch of MV, unable to determine if paravalvular leak)  Rhythm:Regular Rate:Normal  Echo 2021: 1. Left ventricular ejection fraction, by estimation, is 55 to 60%. The  left ventricle has normal function. The left ventricle has no regional  wall motion abnormalities. Left ventricular diastolic function could not  be evaluated.  2. Right ventricular systolic function is normal. The right ventricular  size is mildly enlarged. There is mildly elevated pulmonary artery  systolic pressure. The estimated right ventricular systolic pressure is  58.0 mmHg.  3. Left atrial size was mildly dilated.  4. 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 Mean gradient 10.7 mmHG at  67 bpm. EOA 1.35 cm2 (0.58 cm/m2). P 1/2 93 msec. Peak E 2.3 m/s. Overall,  likely severe patient-prosthesis mismatch is present. . The mitral valve  has been repaired/replaced. No  evidence of mitral valve regurgitation. There is a 25 mm mechanical valve  present in the mitral position. Procedure Date: 08/06/2014.  5. 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 Vmax 3.1 m/s, mean gradient  20 mmHG, DI 0.48,  EOA 1.67 cm2 (0.72 cm/m2). Suspect moderate  patient-prosthesis mismatch. Difficult to determine if there is  paravalvular leak as reported on prior  echocardiograms, TEE recommended to clarify. The aortic valve has been repaired/replaced. Aortic valve regurgitation is not visualized. There is a 19 mm mechanical valve present in the aortic position. Procedure Date: 08/06/2014.  6. The inferior vena cava is normal in size with greater than 50% respiratory variability, suggesting right atrial pressure of 3 mmHg.    Neuro/Psych PSYCHIATRIC DISORDERS Anxiety Depression negative neurological ROS     GI/Hepatic negative GI ROS, Neg liver ROS, Gastric sleeve resection    Endo/Other  Morbid obesityObesity BMI 44  Renal/GU Cr 2.07  negative genitourinary   Musculoskeletal negative musculoskeletal ROS (+) Osteoarthritis,  Abscess L hip   Abdominal (+) + obese,   Peds  Hematology negative hematology ROS (+) anemia , hct 27.4   Anesthesia Other Findings   Reproductive/Obstetrics negative OB ROS                             Anesthesia Physical Anesthesia Plan  ASA: 3  Anesthesia Plan: General   Post-op Pain Management:    Induction: Intravenous  PONV Risk Score and Plan: Ondansetron, Dexamethasone, Midazolam, Scopolamine patch - Pre-op and Treatment may vary due to age or medical condition  Airway Management Planned: LMA  Additional Equipment: None  Intra-op Plan:   Post-operative Plan: Extubation in OR  Informed Consent: I have reviewed the patients History and Physical, chart, labs and discussed the procedure including the risks, benefits and alternatives for the proposed anesthesia with the patient or  authorized representative who has indicated his/her understanding and acceptance.     Dental advisory given  Plan Discussed with: CRNA  Anesthesia Plan Comments:         Anesthesia Quick Evaluation

## 2021-02-20 NOTE — Progress Notes (Signed)
OT Cancellation Note  Patient Details Name: [name] MRN: [id number] DOB: September 20, 1960   Cancelled Treatment:    Reason Eval/Treat Not Completed: Patient at procedure or test/ unavailable--pt off unit at procedure. Will follow and see as able.   Jolaine Artist, OT Acute Rehabilitation Services Pager [phone] Office [phone]   [name] 02/20/2021, 11:53 AM

## 2021-02-21 ENCOUNTER — Encounter (HOSPITAL_COMMUNITY): Payer: Self-pay | Admitting: Orthopedic Surgery

## 2021-02-21 LAB — CBC
HCT: 27.2 % — ABNORMAL LOW (ref 36.0–46.0)
Hemoglobin: 8.3 g/dL — ABNORMAL LOW (ref 12.0–15.0)
MCH: 30.3 pg (ref 26.0–34.0)
MCHC: 30.5 g/dL (ref 30.0–36.0)
MCV: 99.3 fL (ref 80.0–100.0)
Platelets: 232 10*3/uL (ref 150–400)
RBC: 2.74 MIL/uL — ABNORMAL LOW (ref 3.87–5.11)
RDW: 17.1 % — ABNORMAL HIGH (ref 11.5–15.5)
WBC: 10.4 10*3/uL (ref 4.0–10.5)
nRBC: 0 % (ref 0.0–0.2)

## 2021-02-21 LAB — PROTIME-INR
INR: 1.2 (ref 0.8–1.2)
Prothrombin Time: 15.4 seconds — ABNORMAL HIGH (ref 11.4–15.2)

## 2021-02-21 MED ORDER — WARFARIN SODIUM 7.5 MG PO TABS
7.5000 mg | ORAL_TABLET | Freq: Once | ORAL | Status: AC
  Administered 2021-02-21: 7.5 mg via ORAL
  Filled 2021-02-21: qty 1

## 2021-02-21 MED ORDER — WARFARIN - PHARMACIST DOSING INPATIENT: Freq: Every day | Status: DC

## 2021-02-21 NOTE — Progress Notes (Signed)
PT Cancellation Note  Patient Details Name: [name] MRN: [id number] DOB: [birth year]   Cancelled Treatment:    Reason Eval/Treat Not Completed: Other (comment).  Pt did not open eyes more than briefly and refused therapy.  Pt was counseled on the importance of therapy but still declines it.  Follow up as time and pt allow.   Ramond Dial 02/21/2021, 11:42 AM  Mee Hives, PT MS Acute Rehab Dept. Number: [name]

## 2021-02-21 NOTE — Progress Notes (Signed)
ANTICOAGULATION CONSULT NOTE - Initial Consult  Pharmacy Consult:  Coumadin Indication: Afib and St. Jude MVR/AVR  No Known Allergies  Patient Measurements: Height: 5' 6.5" (168.9 cm) Weight: 128 kg (282 lb 3 oz) IBW/kg (Calculated) : 60.45  Vital Signs: Temp: 98.6 F (37 C) (09/24 0548) Temp Source: Oral (09/24 0548) BP: 105/54 (09/24 0548) Pulse Rate: 75 (09/24 0548)  Labs: Recent Labs    02/19/21 0356 02/20/21 0406 02/21/21 0350  HGB 8.1* 8.2* 8.3*  HCT 26.7* 26.9* 27.2*  PLT 244 236 232  LABPROT 15.7* 16.2* 15.4*  INR 1.2 1.3* 1.2  CREATININE  --  2.44*  --      Estimated Creatinine Clearance: 33.4 mL/min (A) (by C-G formula based on SCr of 2.44 mg/dL (H)).   Medical History:   Assessment: 31 YOF with left hip wound infection s/p multiple I&Ds and wound vac application.  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  Pharmacy consulted to resume Coumadin on 9/24.  INR currently sub-therapeutic at 1.2 as expected.  CBC stable. Monitor treatment plan for interacting medications.   Goal of Therapy:  INR 2.5-3.5 Monitor platelets by anticoagulation protocol: Yes   Plan:  Coumadin 7.5 MG x1 today  Daily PT / INR Monitor for bleeding  Cephus Slater, PharmD, MBA Pharmacy Resident [phone] 02/21/2021 7:39 AM

## 2021-02-21 NOTE — Progress Notes (Addendum)
Patient ID: [name], female   DOB: Oct 28, 1960, 60 y.o.   MRN: [id number] Patient is postoperative day 2 repeat debridement of left iliac crest bone graft site infection.  Wound VAC.  Remains clear without any evidence purulence.  Serosanguineous output.  Patient asking to go home today   Physical examination: Left iliac crest wound has a clean dressing.  Wound VAC is holding suction.  Assessment and plan: 2 days status post left iliac crest I&D.  Patient is hoping to go home today.  She was unable to complete physical therapy assessment yesterday, will plan for repeat assessment today.  She will need home services for home wound VAC.

## 2021-02-22 LAB — AEROBIC/ANAEROBIC CULTURE W GRAM STAIN (SURGICAL/DEEP WOUND)

## 2021-02-22 LAB — PROTIME-INR
INR: 1.3 — ABNORMAL HIGH (ref 0.8–1.2)
Prothrombin Time: 16.3 seconds — ABNORMAL HIGH (ref 11.4–15.2)

## 2021-02-22 MED ORDER — WARFARIN SODIUM 7.5 MG PO TABS
7.5000 mg | ORAL_TABLET | Freq: Once | ORAL | Status: AC
  Administered 2021-02-22: 7.5 mg via ORAL
  Filled 2021-02-22: qty 1

## 2021-02-22 NOTE — Progress Notes (Signed)
Physical Therapy Treatment Patient Details Name: [name] MRN: [id number] DOB: 03/06/1961 Today's Date: 02/22/2021   History of Present Illness 60 y.o. 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 Due to the pt's Coumadin she has had continual increased drainage from the wound and currently has a large amount of purulent drainage. Repeat debridement L hip ulcer 9/14, 9/16, 9/21; planned 9/23. 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    PT Comments    Continuing work on functional mobility and activity tolerance;  Session focused on mobiltiy, transfers and attempted initiating a bout of ambulation; Pt needed mod assist for bed mobility and transfers; attempted to walk, however pt turned to sit in recliner;   Lengthy discussion with pt and sister re: post-acute rehab options, including discussing typicla intensity and duration of both SNF and CIR; [name], pt's sister would very much like for CIR to reconsider pt; Deb will need to show much more participation than she is currently showing to be considered for CIR  Recommendations for follow up therapy are one component of a multi-disciplinary discharge planning process, led by the attending physician.  Recommendations may be updated based on patient status, additional functional criteria and insurance authorization.  Follow Up Recommendations  SNF;Supervision/Assistance - 24 hour;Other (comment) (unless cognition/safety improve SIGNIFICANTLY after she has completed surgical POC)     Equipment Recommendations  Rolling walker with 5" wheels;3in1 (PT)    Recommendations for Other Services       Precautions / Restrictions Precautions Precautions: Fall Precaution Comments: wound vac, acute confusion Restrictions LLE Weight Bearing: Weight bearing as tolerated     Mobility  Bed Mobility Overal bed mobility: Needs Assistance Bed  Mobility: Supine to Sit     Supine to sit: +2 for physical assistance;Mod assist     General bed mobility comments: Mod assist and heavy cueing and encouragement to sit up at the EOB    Transfers Overall transfer level: Needs assistance Equipment used: Rolling walker (2 wheeled) Transfers: Sit to/from [facility] Sit to Stand: Mod assist;+2 physical assistance Stand pivot transfers: Min assist;+2 physical assistance;+2 safety/equipment       General transfer comment: Light mod assist and noted dependence on momentum for rise to stand; tried taking step forward for a bout of amb with a chair push, however pt turned and sat to chair prematurely  Ambulation/Gait             General Gait Details: just pivot steps to chair   Stairs             Wheelchair Mobility    Modified Rankin (Stroke Patients Only)       Balance     Sitting balance-Leahy Scale: Poor       Standing balance-Leahy Scale: Poor                              Cognition Arousal/Alertness: Awake/alert Behavior During Therapy: Flat affect;Impulsive Overall Cognitive Status: Impaired/Different from baseline Area of Impairment: Orientation;Attention;Memory;Following commands;Safety/judgement;Awareness;Problem solving                 Orientation Level: Disoriented to;Time Current Attention Level: Sustained Memory: Decreased recall of precautions;Decreased short-term memory Following Commands: Follows one step commands inconsistently;Follows one step commands with increased time Safety/Judgement: Decreased awareness of safety;Decreased awareness of deficits Awareness: Intellectual Problem Solving: Slow processing;Decreased initiation;Difficulty sequencing;Requires verbal cues;Requires tactile  cues General Comments: Very different than 1-2 weeks ago; Sleepy, but arousable; unable to attend to ankle pumping exercise long enough to complete 10 reps; was present for  a lengthy discussion re: post-acute rehab options, but did not remamber teh main point that if she does not participate with acute therapies, there will be no option except going to SNF for rehab -- after the discussion, she only stated, "I don't want to go to a nursing home"      Exercises      General Comments General comments (skin integrity, edema, etc.): See other note of this date for O2 sats      Pertinent Vitals/Pain Pain Assessment: Faces Faces Pain Scale: Hurts little more Pain Location: L hip Pain Descriptors / Indicators: Sore Pain Intervention(s): Limited activity within patient's tolerance    Home Living                      Prior Function            PT Goals (current goals can now be found in the care plan section) Acute Rehab PT Goals Patient Stated Goal: Did not state, except she doesn't want to go to a Nursing Home PT Goal Formulation: With patient Time For Goal Achievement: 03/05/21 Potential to Achieve Goals: Fair Progress towards PT goals: Progressing toward goals (very slowly)    Frequency    Min 3X/week      PT Plan Current plan remains appropriate    Co-evaluation              AM-PAC PT "6 Clicks" Mobility   Outcome Measure  Help needed turning from your back to your side while in a flat bed without using bedrails?: A Little Help needed moving from lying on your back to sitting on the side of a flat bed without using bedrails?: A Lot Help needed moving to and from a bed to a chair (including a wheelchair)?: A Lot Help needed standing up from a chair using your arms (e.g., wheelchair or bedside chair)?: A Little Help needed to walk in hospital room?: A Lot Help needed climbing 3-5 steps with a railing? : Total 6 Click Score: 13    End of Session Equipment Utilized During Treatment: Gait belt Activity Tolerance: Patient tolerated treatment well Patient left: in chair;with call bell/phone within reach;with chair alarm  set;with family/visitor present Nurse Communication: Mobility status PT Visit Diagnosis: Difficulty in walking, not elsewhere classified (R26.2);Muscle weakness (generalized) (M62.81);Unsteadiness on feet (R26.81)     Time: 1610-9604 PT Time Calculation (min) (ACUTE ONLY): 24 min  Charges:  $Therapeutic Activity: 23-37 mins                     Roney Marion, [place]  Acute Rehabilitation Services Pager [phone] Office [place] 02/22/2021, 3:40 PM

## 2021-02-22 NOTE — Progress Notes (Signed)
ANTICOAGULATION CONSULT NOTE - Initial Consult  Pharmacy Consult:  Coumadin Indication: Afib and St. Jude MVR/AVR  No Known Allergies  Patient Measurements: Height: 5' 6.5" (168.9 cm) Weight: 128 kg (282 lb 3 oz) IBW/kg (Calculated) : 60.45  Vital Signs: Temp: 97.9 F (36.6 C) (09/25 0440) Temp Source: Oral (09/25 0440) BP: 139/81 (09/25 0440) Pulse Rate: 90 (09/25 0440)  Labs: Recent Labs    02/20/21 0406 02/21/21 0350 02/22/21 0311  HGB 8.2* 8.3*  --   HCT 26.9* 27.2*  --   PLT 236 232  --   LABPROT 16.2* 15.4* 16.3*  INR 1.3* 1.2 1.3*  CREATININE 2.44*  --   --      Estimated Creatinine Clearance: 33.4 mL/min (A) (by C-G formula based on SCr of 2.44 mg/dL (H)).   Medical History:   Assessment: 82 YOF with left hip wound infection s/p multiple I&Ds and wound vac application.  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  Pharmacy consulted to resume Coumadin on 9/24.  INR currently sub-therapeutic at 1.3 after 7.5 MG on 9/24.  CBC stable. Monitor treatment plan for interacting medications.   PTA Warfarin Dose: 3.75 MG daily except 7.5 mg MWF  Goal of Therapy:  INR 2.5-3.5 Monitor platelets by anticoagulation protocol: Yes   Plan:  Coumadin 7.5 MG x1 today  Daily PT / INR Monitor for bleeding  Cephus Slater, PharmD, MBA Pharmacy Resident [phone] 02/22/2021 8:14 AM

## 2021-02-23 ENCOUNTER — Encounter (HOSPITAL_COMMUNITY): Payer: Self-pay | Admitting: Orthopedic Surgery

## 2021-02-23 LAB — PROTIME-INR
INR: 1.4 — ABNORMAL HIGH (ref 0.8–1.2)
Prothrombin Time: 17.2 seconds — ABNORMAL HIGH (ref 11.4–15.2)

## 2021-02-23 LAB — CBC
HCT: 25.8 % — ABNORMAL LOW (ref 36.0–46.0)
Hemoglobin: 7.6 g/dL — ABNORMAL LOW (ref 12.0–15.0)
MCH: 29.6 pg (ref 26.0–34.0)
MCHC: 29.5 g/dL — ABNORMAL LOW (ref 30.0–36.0)
MCV: 100.4 fL — ABNORMAL HIGH (ref 80.0–100.0)
Platelets: 207 10*3/uL (ref 150–400)
RBC: 2.57 MIL/uL — ABNORMAL LOW (ref 3.87–5.11)
RDW: 17 % — ABNORMAL HIGH (ref 11.5–15.5)
WBC: 6.4 10*3/uL (ref 4.0–10.5)
nRBC: 0 % (ref 0.0–0.2)

## 2021-02-23 MED ORDER — WARFARIN SODIUM 5 MG PO TABS
10.0000 mg | ORAL_TABLET | Freq: Once | ORAL | Status: AC
  Administered 2021-02-23: 10 mg via ORAL
  Filled 2021-02-23 (×3): qty 2

## 2021-02-23 NOTE — Plan of Care (Signed)
  Problem: Education: Goal: Knowledge of General Education information will improve Description: Including pain rating scale, medication(s)/side effects and non-pharmacologic comfort measures Outcome: Progressing   Problem: Health Behavior/Discharge Planning: Goal: Ability to manage health-related needs will improve Outcome: Not Progressing   Problem: Clinical Measurements: Goal: Respiratory complications will improve Outcome: Progressing

## 2021-02-23 NOTE — Progress Notes (Signed)
Patient ID: [name], female   DOB: 1961-02-07, 60 y.o.   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  Patient only had 25 cc of clear drainage on Saturday she now has 150 cc drainage in the canister.  We will plan on discharging once the drainage has subsided.  Plan for physical therapy for progressive ambulation weightbearing as tolerated.

## 2021-02-23 NOTE — Progress Notes (Signed)
ANTICOAGULATION CONSULT NOTE - Initial Consult  Pharmacy Consult:  Coumadin Indication: Afib and St. Jude MVR/AVR  No Known Allergies  Patient Measurements: Height: 5' 6.5" (168.9 cm) Weight: 128 kg (282 lb 3 oz) IBW/kg (Calculated) : 60.45  Vital Signs: Temp: 97.5 F (36.4 C) (09/26 0809) Temp Source: Oral (09/26 0809) BP: 115/85 (09/26 0809) Pulse Rate: 82 (09/26 0809)  Labs: Recent Labs    02/21/21 0350 02/22/21 0311 02/23/21 0335  HGB 8.3*  --  7.6*  HCT 27.2*  --  25.8*  PLT 232  --  207  LABPROT 15.4* 16.3* 17.2*  INR 1.2 1.3* 1.4*     Estimated Creatinine Clearance: 33.4 mL/min (A) (by C-G formula based on SCr of 2.44 mg/dL (H)).   Medical History:   Assessment: 48 YOF with left hip wound infection s/p multiple I&Ds and wound vac application.  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  Pharmacy consulted to resume Coumadin on 9/24.  INR currently sub-therapeutic at 1.4 after 7.5 MG on 9/24-9/25.  CBC stable. Monitor treatment plan for interacting medications.   PTA Warfarin Dose: 3.75 MG daily except 7.5 mg MWF  Goal of Therapy:  INR 2.5-3.5 Monitor platelets by anticoagulation protocol: Yes   Plan:  Coumadin 10 MG x1 today  Daily PT / INR Monitor for bleeding  Alanda Slim, PharmD, [facility] Clinical Pharmacist Please see AMION for all Pharmacists' Contact Phone Numbers 02/23/2021, 9:27 AM

## 2021-02-23 NOTE — Progress Notes (Signed)
Occupational Therapy Treatment Patient Details Name: [name] MRN: [id number] DOB: [birth year] Today's Date: 02/23/2021   History of present illness 60 y.o. 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 Due to the pt's Coumadin she has had continual increased drainage from the wound and currently has a large amount of purulent drainage. Repeat debridement L hip ulcer 9/14, 9/16, 9/21; planned 9/23. 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   OT comments  Pt continues to make slow progress but was agreeable to and participated well in therapy.  Pt more mobile today getting to EOB with min assist and to [facility] with min assist.  Spoke to pt about safety and need to do therapy whenever they come.  Will continue with focus on adls in standing.    Recommendations for follow up therapy are one component of a multi-disciplinary discharge planning process, led by the attending physician.  Recommendations may be updated based on patient status, additional functional criteria and insurance authorization.    Follow Up Recommendations  SNF;Supervision/Assistance - 24 hour    Equipment Recommendations  3 in 1 bedside commode    Recommendations for Other Services      Precautions / Restrictions Precautions Precautions: Fall Precaution Comments: wound vac Restrictions Weight Bearing Restrictions: Yes       Mobility Bed Mobility Overal bed mobility: Needs Assistance Bed Mobility: Supine to Sit     Supine to sit: Min assist;HOB elevated     General bed mobility comments: Pt came to EOB with min assist and cues to scoot forward to get feet on the floor.    Transfers Overall transfer level: Needs assistance Equipment used: Rolling walker (2 wheeled) Transfers: Sit to/from [facility] Sit to Stand: Min assist Stand pivot transfers: Min assist;+2 physical assistance;+2  safety/equipment       General transfer comment: Pt eager to get up and into chair.  Cues for safety as pt in a hurry to sit.    Balance Overall balance assessment: Needs assistance Sitting-balance support: Feet supported Sitting balance-Leahy Scale: Fair     Standing balance support: Bilateral upper extremity supported;During functional activity Standing balance-Leahy Scale: Poor Standing balance comment: Pt with walker and min assist to maintain balance.  Pt impulsive to sit.                           ADL either performed or assessed with clinical judgement   ADL Overall ADL's : Needs assistance/impaired Eating/Feeding: Independent;Sitting Eating/Feeding Details (indicate cue type and reason): recommend pt be in chair for all meals Grooming: Wash/dry hands;Wash/dry face;Standing;Min guard Grooming Details (indicate cue type and reason): sink level             Lower Body Dressing: Minimal assistance;+2 for safety/equipment;Sit to/from stand Lower Body Dressing Details (indicate cue type and reason): Pt can pull legs up onto the side of bed to donn socks with cues. Toilet Transfer: Minimal Production assistant, radio Details (indicate cue type and reason): supervision for lines and min assist to power up.         Functional mobility during ADLs: +2 for safety/equipment;Rolling walker;Cueing for safety;Cueing for sequencing;Minimal assistance;+2 for physical assistance General ADL Comments: limited by decreased balance and lines.  Pt with mild confusion at times.     Vision   Vision Assessment?: No apparent visual deficits   Perception     Praxis  Cognition Arousal/Alertness: Awake/alert Behavior During Therapy: Flat affect Overall Cognitive Status: Impaired/Different from baseline Area of Impairment: Orientation;Attention;Memory;Following commands;Safety/judgement;Awareness;Problem solving                   Current  Attention Level: Sustained Memory: Decreased recall of precautions;Decreased short-term memory Following Commands: Follows one step commands consistently Safety/Judgement: Decreased awareness of safety;Decreased awareness of deficits Awareness: Intellectual Problem Solving: Slow processing;Decreased initiation;Requires verbal cues General Comments: Pt more alert today.  Pt continues to need continuous encouragement to participate in therapy but did very well with that encouragement today.        Exercises     Shoulder Instructions       General Comments Pt more motivated and agreeable today to ADLS and transfers.    Pertinent Vitals/ Pain       Pain Assessment: Faces Faces Pain Scale: Hurts little more Pain Location: L hip Pain Descriptors / Indicators: Sore Pain Intervention(s): Monitored during session;Repositioned  Home Living                                          Prior Functioning/Environment              Frequency  Min 2X/week        Progress Toward Goals  OT Goals(current goals can now be found in the care plan section)  Progress towards OT goals: Progressing toward goals  Acute Rehab OT Goals Patient Stated Goal: Did not state, except she doesn't want to go to a Nursing Home OT Goal Formulation: With patient Time For Goal Achievement: 03/05/21 Potential to Achieve Goals: Good ADL Goals Pt Will Perform Grooming: with modified independence;standing Pt Will Perform Upper Body Bathing: with modified independence;standing Pt Will Perform Lower Body Bathing: with modified independence;sit to/from stand Pt Will Perform Upper Body Dressing: with modified independence;sitting Pt Will Perform Lower Body Dressing: with modified independence;sit to/from stand Pt Will Transfer to Toilet: with modified independence;ambulating;bedside commode Pt Will Perform Toileting - Clothing Manipulation and hygiene: with modified independence;sit to/from  stand Pt Will Perform Tub/Shower Transfer: Tub transfer;rolling walker Pt/caregiver will Perform Home Exercise Program: Increased strength;Both right and left upper extremity;With theraband  Plan Discharge plan remains appropriate    Co-evaluation                 AM-PAC OT "6 Clicks" Daily Activity     Outcome Measure   Help from another person eating meals?: None Help from another person taking care of personal grooming?: None Help from another person toileting, which includes using toliet, bedpan, or urinal?: A Little Help from another person bathing (including washing, rinsing, drying)?: A Little Help from another person to put on and taking off regular upper body clothing?: A Little Help from another person to put on and taking off regular lower body clothing?: A Little 6 Click Score: 20    End of Session Equipment Utilized During Treatment: Rolling walker;Oxygen  OT Visit Diagnosis: Unsteadiness on feet (R26.81)   Activity Tolerance Patient tolerated treatment well   Patient Left in chair;with call bell/phone within reach   Nurse Communication Mobility status;Other (comment) (Pt O2 sats at 88% without O2.  Replaced O2.  Pt states she does not need it but sat rate showed she did.)        Time: 3220-2542 OT Time Calculation (min): 21 min  Charges: OT General Charges $OT Visit:  1 Visit OT Treatments $Self Care/Home Management : 8-22 mins   Glenford Peers 02/23/2021, 2:00 PM

## 2021-02-24 ENCOUNTER — Encounter: Payer: Self-pay | Admitting: Orthopedic Surgery

## 2021-02-24 LAB — PROTIME-INR
INR: 1.6 — ABNORMAL HIGH (ref 0.8–1.2)
Prothrombin Time: 18.7 seconds — ABNORMAL HIGH (ref 11.4–15.2)

## 2021-02-24 MED ORDER — SODIUM CHLORIDE 0.9 % IV SOLN
2.0000 g | INTRAVENOUS | Status: DC
  Administered 2021-02-24: 2 g via INTRAVENOUS
  Filled 2021-02-24 (×2): qty 20

## 2021-02-24 MED ORDER — WARFARIN SODIUM 7.5 MG PO TABS
7.5000 mg | ORAL_TABLET | Freq: Once | ORAL | Status: AC
  Administered 2021-02-24: 7.5 mg via ORAL
  Filled 2021-02-24: qty 1

## 2021-02-24 NOTE — Progress Notes (Signed)
Patient ID: [name], female   DOB: 14-May-1961, 60 y.o.   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  There is 400 cc of clear serosanguineous fluid in the wound VAC canister this morning this is an increase from 150 cc on Monday.  Pharmacy to adjust for antibiotic coverage for S[place]ia and anticipate discharge on oral antibiotics for a month.  Will discharge to home once the [facility] drainage is less than 50 cc in a 24-hour period.

## 2021-02-24 NOTE — Progress Notes (Signed)
ANTICOAGULATION CONSULT NOTE - Follow Up Consult  Pharmacy Consult:  Coumadin Indication: Afib and St. Jude MVR/AVR  No Known Allergies  Patient Measurements: Height: 5' 6.5" (168.9 cm) Weight: 128 kg (282 lb 3 oz) IBW/kg (Calculated) : 60.45  Vital Signs: Temp: 98.6 F (37 C) (09/27 0805) Temp Source: Oral (09/27 0805) BP: 133/102 (09/27 0805) Pulse Rate: 91 (09/27 0805)  Labs: Recent Labs    02/22/21 0311 02/23/21 0335 02/24/21 0403  HGB  --  7.6*  --   HCT  --  25.8*  --   PLT  --  207  --   LABPROT 16.3* 17.2* 18.7*  INR 1.3* 1.4* 1.6*     Estimated Creatinine Clearance: 33.4 mL/min (A) (by C-G formula based on SCr of 2.44 mg/dL (H)).   Medical History:   Assessment: 86 YOF with left hip wound infection s/p multiple I&Ds and wound vac application.  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  Pharmacy consulted to resume Coumadin on 9/24.   INR remains sub-therapeutic but has trended up to 1.6 after 3 doses of warfarin. Monitor treatment plan for interacting medications.   PTA Warfarin Dose: 3.75 MG daily except 7.5 mg MWF  Goal of Therapy:  INR 2.5-3.5 Monitor platelets by anticoagulation protocol: Yes   Plan:  Coumadin 7.5 MG x1 today  Daily PT / INR Monitor for bleeding  Albertina Parr, PharmD., BCPS, BCCCP Clinical Pharmacist Please refer to [facility] for unit-specific pharmacist

## 2021-02-24 NOTE — Progress Notes (Signed)
Physical Therapy Treatment Patient Details Name: [name] MRN: [id number] DOB: [birth year] Today's Date: 02/24/2021   History of Present Illness 60 y.o. 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 Due to the pt's Coumadin she has had continual increased drainage from the wound and currently has a large amount of purulent drainage. Repeat debridement L hip ulcer 9/14, 9/16, 9/21; planned 9/23. 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    PT Comments    Patient received in bed, pleasant and cooperative but attempting to eat bacon while laying almost flat on her back. Raised HOB to allow her to finish eating. A&Ox3 for basic questions, however quickly becomes confused and unsafe when attempting even low level mobility tasks. Needed multimodal cues and lots of time to maintain good hand placement and safety today. Able to pivot to recliner with one person assist, but would definitely need +2 to progress mobility safely. Left up in recliner with all needs met, chair alarm active and RN present. Will continue efforts.    Recommendations for follow up therapy are one component of a multi-disciplinary discharge planning process, led by the attending physician.  Recommendations may be updated based on patient status, additional functional criteria and insurance authorization.  Follow Up Recommendations  SNF;Supervision/Assistance - 24 hour;Other (comment) (unless cognition/safety SIGNIFICANTLY improve after completing surgical POC)     Equipment Recommendations  Rolling walker with 5" wheels;3in1 (PT)    Recommendations for Other Services       Precautions / Restrictions Precautions Precautions: Fall Precaution Comments: wound vac Restrictions Weight Bearing Restrictions: Yes LLE Weight Bearing: Weight bearing as tolerated     Mobility  Bed Mobility Overal bed mobility: Needs  Assistance Bed Mobility: Supine to Sit     Supine to sit: HOB elevated;Min assist     General bed mobility comments: MinA to boost trunk up but able to manage BLEs to square up at EOB and get feet on floor    Transfers Overall transfer level: Needs assistance Equipment used: Rolling walker (2 wheeled) Transfers: Sit to/from [facility] Sit to Stand: Mod assist Stand pivot transfers: Min assist       General transfer comment: ModA and multiple attempts to get to standing from EOB; she was sometimes physically resistive and stated "I'm just repositioning I will tell you when I'm going for real"  Ambulation/Gait             General Gait Details: deferred- will need +2 for chair follow/safety   Stairs             Wheelchair Mobility    Modified Rankin (Stroke Patients Only)       Balance Overall balance assessment: Needs assistance Sitting-balance support: Feet supported Sitting balance-Leahy Scale: Good Sitting balance - Comments: close S but able to maintain midline   Standing balance support: Bilateral upper extremity supported;During functional activity Standing balance-Leahy Scale: Poor Standing balance comment: Pt with walker and min assist to maintain balance.  Impulsive                            Cognition Arousal/Alertness: Awake/alert Behavior During Therapy: Flat affect Overall Cognitive Status: Impaired/Different from baseline Area of Impairment: Orientation;Attention;Memory;Following commands;Safety/judgement;Awareness;Problem solving                 Orientation Level: Disoriented to;Situation Current Attention Level: Sustained Memory: Decreased recall of precautions;Decreased  short-term memory Following Commands: Follows one step commands inconsistently;Follows one step commands with increased time Safety/Judgement: Decreased awareness of safety;Decreased awareness of deficits Awareness:  Intellectual Problem Solving: Slow processing;Decreased initiation;Requires verbal cues General Comments: more alert today, A&Ox3 but quickly becomes confused and unsafe with functional situations. Needed multimodal cues to maintain safety as well as lots of extra time just for pivot to the recliner      Exercises      General Comments        Pertinent Vitals/Pain Pain Assessment: Faces Faces Pain Scale: No hurt Pain Intervention(s): Limited activity within patient's tolerance;Monitored during session    Home Living                      Prior Function            PT Goals (current goals can now be found in the care plan section) Acute Rehab PT Goals Patient Stated Goal: Did not state, except she doesn't want to go to a Nursing Home PT Goal Formulation: With patient Time For Goal Achievement: 03/05/21 Potential to Achieve Goals: Fair Progress towards PT goals: Progressing toward goals (very slowly)    Frequency    Min 3X/week      PT Plan Current plan remains appropriate    Co-evaluation              AM-PAC PT "6 Clicks" Mobility   Outcome Measure  Help needed turning from your back to your side while in a flat bed without using bedrails?: A Little Help needed moving from lying on your back to sitting on the side of a flat bed without using bedrails?: A Lot Help needed moving to and from a bed to a chair (including a wheelchair)?: A Lot Help needed standing up from a chair using your arms (e.g., wheelchair or bedside chair)?: A Lot Help needed to walk in hospital room?: A Lot Help needed climbing 3-5 steps with a railing? : Total 6 Click Score: 12    End of Session Equipment Utilized During Treatment: Gait belt Activity Tolerance: Patient tolerated treatment well Patient left: in chair;with call bell/phone within reach;with chair alarm set Nurse Communication: Mobility status PT Visit Diagnosis: Difficulty in walking, not elsewhere classified  (R26.2);Muscle weakness (generalized) (M62.81);Unsteadiness on feet (R26.81)     Time: 1003-4961 PT Time Calculation (min) (ACUTE ONLY): 32 min  Charges:  $Therapeutic Activity: 23-37 mins                    Windell Norfolk, DPT, PN2   Supplemental Physical Therapist [place]    Pager [phone] Acute Rehab Office [phone]

## 2021-02-25 LAB — PROTIME-INR
INR: 1.9 — ABNORMAL HIGH (ref 0.8–1.2)
Prothrombin Time: 21.3 seconds — ABNORMAL HIGH (ref 11.4–15.2)

## 2021-02-25 LAB — PREPARE RBC (CROSSMATCH)

## 2021-02-25 MED ORDER — SODIUM CHLORIDE 0.9% IV SOLUTION: Freq: Once | INTRAVENOUS | Status: DC

## 2021-02-25 MED ORDER — CIPROFLOXACIN HCL 500 MG PO TABS
500.0000 mg | ORAL_TABLET | Freq: Two times a day (BID) | ORAL | Status: DC
  Administered 2021-02-25 (×2): 500 mg via ORAL
  Filled 2021-02-25 (×3): qty 1

## 2021-02-25 MED ORDER — WARFARIN SODIUM 7.5 MG PO TABS
7.5000 mg | ORAL_TABLET | Freq: Once | ORAL | Status: AC
  Administered 2021-02-25: 7.5 mg via ORAL
  Filled 2021-02-25: qty 1

## 2021-02-25 NOTE — NC FL2 (Signed)
[place] LEVEL OF CARE SCREENING TOOL     IDENTIFICATION  Patient Name: [name] Birthdate: 24-Oct-1960 Sex: female Admission Date (Current Location): 02/04/2021  [facility] and [place] Number:  Herbalist and Address:         Provider Number: [id number]  Attending Physician Name and Address:  [name], MD  Relative Name and Phone Number:  [name] [phone]    Current Level of Care: Hospital Recommended Level of Care: [place] Prior Approval Number:    Date Approved/Denied:   PASRR Number: [id number]  Discharge Plan: SNF    Current Diagnoses: Patient Active Problem List   Diagnosis Date Noted   Hardware complicating wound infection ([place]) 02/04/2021   Acute osteomyelitis, pelvis, left (HCC)    Postoperative hematoma of musculoskeletal structure following musculoskeletal procedure 01/19/2021   Hematoma of abdominal wall 01/03/2021   Hypovolemic shock ([place]) 12/30/2020   History of bariatric surgery 04/18/2018   Abdominal pain 02/02/2018   Hyperkalemia 02/02/2018   Acute kidney injury superimposed on CKD ([place]) 02/02/2018   Anemia    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 02/01/2018   Postoperative anemia due to chronic blood loss on full anticoagulation 02/01/2018   AKI (acute kidney injury) ([place]) 02/01/2018   Acute renal failure superimposed on stage 3 chronic kidney disease ([place]) 01/26/2018   PAF (paroxysmal atrial fibrillation) ([place])    Acute respiratory failure (HCC)    Hypoxia    Postprocedural hypotension    Chronic acquired lymphedema - Right lower extremity 01/24/2018   Hx of mechanical aortic valve replacement 01/24/2018   Obstructive sleep apnea 02/03/2017   Chronic diastolic heart failure ([place]) 01/11/2017   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   Morbid obesity with BMI of 50.0-59.9, adult ([place]) 02/11/2016   HX: long term anticoagulant use 02/11/2016   Chronic respiratory failure ([place]) 08/12/2015    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   Allergic rhinitis 05/02/2015   Symptomatic anemia 04/08/2015   History of mitral valve replacement with mechanical valve 04/08/2015   CKD (chronic kidney disease) stage 3, GFR 30-59 ml/min (HCC) 04/08/2015   Atrial flutter, unspecified    Chronic anticoagulation 09/04/2014   Warfarin anticoagulation 08/14/2014   CHB (complete heart block) ([place]) 08/14/2014   First degree AV block 08/14/2014   Hypercarbia 08/07/2014   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   ILD (interstitial lung disease) ([place]) 10/12/2013   Aortic stenosis 10/02/2013   Hyperlipidemia 10/02/2013   Chronic asthmatic bronchitis ([place]) 08/06/2013   Gout 04/16/2011   Essential hypertension 10/07/2010   Depressive disorder, not elsewhere classified 10/07/2010   Major depressive disorder, single episode 10/07/2010   Impaired fasting glucose 04/01/2010   Vitamin D deficiency 04/01/2010   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    Orientation RESPIRATION BLADDER Height & Weight     Self, Time, Situation, Place  Normal Continent Weight: 128 kg Height:  5' 6.5" (168.9 cm)  BEHAVIORAL SYMPTOMS/MOOD NEUROLOGICAL BOWEL NUTRITION STATUS      Continent Diet  AMBULATORY STATUS COMMUNICATION OF NEEDS Skin   Extensive Assist Verbally Surgical wounds (Multiple hip debridements has [place]ve[place] wound VAC)                       Personal Care Assistance Level of Assistance  Bathing, Feeding, Dressing Bathing Assistance: Limited assistance Feeding assistance: Independent Dressing Assistance: Limited assistance     Functional Limitations Info             SPECIAL CARE  FACTORS FREQUENCY  PT (By licensed PT), OT (By licensed OT)     PT Frequency: five times a week OT Frequency: five times a week            Contractures Contractures Info: Not present    Additional Factors Info  Code Status, Allergies Code Status Info: Full Allergies Info: no Known allergies            Current Medications (02/25/2021):  This is the current hospital active medication list Current Facility-Administered Medications  Medication Dose Route Frequency Provider Last Rate Last Admin   0.9 %  sodium chloride infusion (Manually program via Guardrails IV Fluids)   Intravenous Once Persons, Bevely Palmer, PA       0.9 %  sodium chloride infusion   Intravenous Continuous [name], MD   Stopped at 02/20/21 1410   acetaminophen (TYLENOL) tablet 325-650 mg  325-650 mg Oral Q6H PRN [name], MD   650 mg at 02/13/21 0532   acidophilus (RISAQUAD) capsule 2 capsule  2 capsule Oral Daily [name], MD   2 capsule at 02/25/21 0940   allopurinol (ZYLOPRIM) tablet 300 mg  300 mg Oral QHS [name], MD   300 mg at 02/24/21 2043   ascorbic acid (VITAMIN C) tablet 1,000 mg  1,000 mg Oral Daily [name], MD   1,000 mg at 02/25/21 0941   bisacodyl (DULCOLAX) suppository 10 mg  10 mg Rectal Daily PRN [name], MD       buPROPion (WELLBUTRIN XL) 24 hr tablet 300 mg  300 mg Oral Daily [name], MD   300 mg at 02/25/21 0940   cholecalciferol (VITAMIN D3) tablet 6,000 Units  6,000 Units Oral Daily [name], MD   6,000 Units at 02/25/21 0940   ciprofloxacin (CIPRO) tablet 500 mg  500 mg Oral BID [name], MD   500 mg at 02/25/21 1215   citalopram (CELEXA) tablet 20 mg  20 mg Oral Daily [name], MD   20 mg at 02/25/21 1937   docusate sodium (COLACE) capsule 100 mg  100 mg Oral BID [name], MD   100 mg at 02/24/21 2042   feeding supplement (ENSURE ENLIVE / ENSURE PLUS) liquid 237 mL  237 mL Oral BID BM [name], MD   237 mL at 02/24/21 1026   HYDROmorphone (DILAUDID) injection 0.5 mg  0.5 mg Intravenous Q4H PRN [name], MD       lactated ringers infusion   Intravenous Continuous [name], MD 10 mL/hr at 02/13/21 1409 New Bag at 02/13/21 1429   methocarbamol (ROBAXIN) tablet 500 mg  500 mg Oral Q6H PRN [name], MD   500 mg  at 02/23/21 2142   Or   methocarbamol (ROBAXIN) 500 mg in dextrose 5 % 50 mL IVPB  500 mg Intravenous Q6H PRN [name], MD       metoCLOPramide (REGLAN) tablet 5 mg  5 mg Oral Q8H PRN [name], MD       Or   metoCLOPramide (REGLAN) injection 5 mg  5 mg Intravenous Q8H PRN [name], MD       metoprolol tartrate (LOPRESSOR) tablet 50 mg  50 mg Oral Daily [name], MD   50 mg at 02/25/21 0945   montelukast (SINGULAIR) tablet 10 mg  10 mg Oral Daily [name], MD   10 mg at  02/25/21 0941   ondansetron (ZOFRAN) tablet 4 mg  4 mg Oral Q6H PRN [name], MD       Or   ondansetron [facility]) injection 4 mg  4 mg Intravenous Q6H PRN [name], MD   4 mg at 02/25/21 1225   oxyCODONE (Oxy IR/ROXICODONE) immediate release tablet 5-10 mg  5-10 mg Oral Q4H PRN [name], MD       pantoprazole (PROTONIX) EC tablet 40 mg  40 mg Oral Daily [name], MD   40 mg at 02/25/21 0941   polyethylene glycol (MIRALAX / GLYCOLAX) packet 17 g  17 g Oral Daily PRN [name], MD   17 g at 02/21/21 1744   potassium chloride SA (KLOR-CON) CR tablet 20 mEq  20 mEq Oral QHS [name], MD   20 mEq at 02/24/21 2043   pravastatin (PRAVACHOL) tablet 80 mg  80 mg Oral Daily [name], MD   80 mg at 02/25/21 0942   scopolamine (TRANSDERM-SCOP) 1 MG/3DAYS 1.5 mg  1 patch Transdermal Q72H [name], MD   1.5 mg at 02/23/21 1035   sodium chloride flush (NS) 0.9 % injection 10-40 mL  10-40 mL Intracatheter Q12H [name], MD   10 mL at 02/24/21 1026   sodium chloride flush (NS) 0.9 % injection 10-40 mL  10-40 mL Intracatheter PRN [name], MD   10 mL at 02/14/21 4431   warfarin (COUMADIN) tablet 7.5 mg  7.5 mg Oral ONCE-1600 [place], Theone Murdoch, [facility]       Warfarin - Pharmacist Dosing Inpatient   Does not apply q1600 [place]ana Aliment, [facility]   Given at 02/22/21 1629   zinc sulfate capsule 220 mg  220 mg Oral Daily [name], MD   220 mg at 02/25/21 0940      Discharge Medications: Please see discharge summary for a list of discharge medications.  Relevant Imaging Results:  Relevant Lab Results:   Additional Information SS [id number],  status post multiple hip debridements  Linzi Ohlinger, Edson Snowball, RN

## 2021-02-25 NOTE — Progress Notes (Signed)
Patient is status post multiple hip debridements.  Was seen and evaluated by physical therapy who is recommending skilled nursing.   Patient is an bed this morning wound VAC has 100 cc of serous fluid.  Functioning well.   Status post above.  Patient does have symptomatic anemia per Dr. Sharol Given will transfuse patient 1 unit today.  Have consulted transition of care for transfer to skilled nursing in the next day or so

## 2021-02-25 NOTE — Progress Notes (Signed)
ANTICOAGULATION CONSULT NOTE - Follow Up Consult  Pharmacy Consult:  Coumadin Indication: Afib and St. Jude MVR/AVR  No Known Allergies  Patient Measurements: Height: 5' 6.5" (168.9 cm) Weight: 128 kg (282 lb 3 oz) IBW/kg (Calculated) : 60.45  Vital Signs: Temp: 97.8 F (36.6 C) (09/28 0517) Temp Source: Oral (09/28 0517) BP: 94/64 (09/28 0517) Pulse Rate: 77 (09/28 0517)  Labs: Recent Labs    02/23/21 0335 02/24/21 0403 02/25/21 0512  HGB 7.6*  --   --   HCT 25.8*  --   --   PLT 207  --   --   LABPROT 17.2* 18.7* 21.3*  INR 1.4* 1.6* 1.9*     Estimated Creatinine Clearance: 33.4 mL/min (A) (by C-G formula based on SCr of 2.44 mg/dL (H)).   Medical History:   Assessment: 60 yo F with left hip wound infection s/p multiple I&Ds and wound vac application.  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  Pharmacy consulted to resume Coumadin on 9/24.   INR remains sub-therapeutic but has trended up to 1.9 after 4 doses of warfarin. Monitor treatment plan for interacting medications.   PTA Warfarin Dose: 3.75 MG daily except 7.5 mg MWF  Goal of Therapy:  INR 2.5-3.5 Monitor platelets by anticoagulation protocol: Yes   Plan:  Repeat Coumadin 7.5 MG x1 today  Daily PT / INR Monitor for bleeding Will also order BMET for 9/29 AM  [employer], Pharm.D., BCPS Clinical Pharmacist Clinical phone for 02/25/2021 from 7:30-3:00 is [phone].  **Pharmacist phone directory can be found on [email or web address] listed under [place].  02/25/2021 8:38 AM

## 2021-02-26 ENCOUNTER — Encounter (HOSPITAL_COMMUNITY): Payer: Self-pay | Admitting: Orthopedic Surgery

## 2021-02-26 DIAGNOSIS — M86152 Other acute osteomyelitis, left femur: Secondary | ICD-10-CM | POA: Diagnosis not present

## 2021-02-26 LAB — BASIC METABOLIC PANEL
Anion gap: 7 (ref 5–15)
Anion gap: 7 (ref 5–15)
BUN: 44 mg/dL — ABNORMAL HIGH (ref 8–23)
BUN: 46 mg/dL — ABNORMAL HIGH (ref 8–23)
CO2: 15 mmol/L — ABNORMAL LOW (ref 22–32)
CO2: 16 mmol/L — ABNORMAL LOW (ref 22–32)
Calcium: 11.8 mg/dL — ABNORMAL HIGH (ref 8.9–10.3)
Calcium: 11.7 mg/dL — ABNORMAL HIGH (ref 8.9–10.3)
Chloride: 115 mmol/L — ABNORMAL HIGH (ref 98–111)
Chloride: 114 mmol/L — ABNORMAL HIGH (ref 98–111)
Creatinine, Ser: 4.54 mg/dL — ABNORMAL HIGH (ref 0.44–1.00)
Creatinine, Ser: 4.87 mg/dL — ABNORMAL HIGH (ref 0.44–1.00)
GFR, Estimated: 10 mL/min — ABNORMAL LOW (ref >60)
GFR, Estimated: 10 mL/min — ABNORMAL LOW (ref >60)
Glucose, Bld: 79 mg/dL (ref 70–99)
Glucose, Bld: 97 mg/dL (ref 70–99)
Potassium: 4.9 mmol/L (ref 3.5–5.1)
Potassium: 4.7 mmol/L (ref 3.5–5.1)
Sodium: 137 mmol/L (ref 135–145)
Sodium: 137 mmol/L (ref 135–145)

## 2021-02-26 LAB — URINALYSIS, ROUTINE W REFLEX MICROSCOPIC
Bilirubin Urine: NEGATIVE
Glucose, UA: NEGATIVE mg/dL
Ketones, ur: NEGATIVE mg/dL
Leukocytes,Ua: NEGATIVE
Nitrite: NEGATIVE
Protein, ur: 30 mg/dL — AB
Specific Gravity, Urine: 1.01 (ref 1.005–1.030)
pH: 5 (ref 5.0–8.0)

## 2021-02-26 LAB — BPAM RBC
Blood Product Expiration Date: 202210292359
ISSUE DATE / TIME: 202209281644
Unit Type and Rh: 5100

## 2021-02-26 LAB — TYPE AND SCREEN
ABO/RH(D): O POS
Antibody Screen: NEGATIVE
Unit division: 0

## 2021-02-26 LAB — HEMOGLOBIN AND HEMATOCRIT, BLOOD
HCT: 30.1 % — ABNORMAL LOW (ref 36.0–46.0)
Hemoglobin: 9.1 g/dL — ABNORMAL LOW (ref 12.0–15.0)

## 2021-02-26 LAB — PROTIME-INR
INR: 2.1 — ABNORMAL HIGH (ref 0.8–1.2)
Prothrombin Time: 23.7 seconds — ABNORMAL HIGH (ref 11.4–15.2)

## 2021-02-26 MED ORDER — WARFARIN SODIUM 5 MG PO TABS
5.0000 mg | ORAL_TABLET | Freq: Once | ORAL | Status: AC
  Administered 2021-02-26: 5 mg via ORAL
  Filled 2021-02-26: qty 1

## 2021-02-26 MED ORDER — ALLOPURINOL 300 MG PO TABS
150.0000 mg | ORAL_TABLET | Freq: Every day | ORAL | Status: DC
  Administered 2021-02-26 – 2021-03-03 (×6): 150 mg via ORAL
  Filled 2021-02-26 (×6): qty 1

## 2021-02-26 MED ORDER — CEFDINIR 300 MG PO CAPS: 300.0000 mg | ORAL_CAPSULE | Freq: Two times a day (BID) | ORAL | Status: DC

## 2021-02-26 MED ORDER — CIPROFLOXACIN HCL 500 MG PO TABS
500.0000 mg | ORAL_TABLET | Freq: Every day | ORAL | Status: DC
  Administered 2021-02-26 – 2021-02-27 (×2): 500 mg via ORAL
  Filled 2021-02-26 (×2): qty 1

## 2021-02-26 NOTE — Progress Notes (Signed)
Occupational Therapy Treatment Patient Details Name: [name] MRN: [id number] DOB: 1960/09/12 Today's Date: 02/26/2021   History of present illness 60 y.o. 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 Due to the pt's Coumadin she has had continual increased drainage from the wound and currently has a large amount of purulent drainage. Repeat debridement L hip ulcer 9/14, 9/16, 9/21; planned 9/23. 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   OT comments  Patient received in bed and was agreeable to OT treatment.  Patient was min assist to get to eob and wanted to address toilet transfer. Patient stood from eob with min assist but was unsafe standing with knees buckling and patient unable to maintain prolonged stand. Grooming performed seated on eob and patient had difficulty loading toothbrush or holding cup due to jerky movements. Patient attempted to stand again with same results and was returned to supine. Acute OT to continue to follow.    Recommendations for follow up therapy are one component of a multi-disciplinary discharge planning process, led by the attending physician.  Recommendations may be updated based on patient status, additional functional criteria and insurance authorization.    Follow Up Recommendations  SNF;Supervision/Assistance - 24 hour    Equipment Recommendations  3 in 1 bedside commode    Recommendations for Other Services      Precautions / Restrictions Precautions Precautions: Fall Precaution Comments: wound vac Restrictions LLE Weight Bearing: Weight bearing as tolerated       Mobility Bed Mobility Overal bed mobility: Needs Assistance Bed Mobility: Supine to Sit;Sit to Supine     Supine to sit: HOB elevated;Min assist Sit to supine: Min assist   General bed mobility comments: min assist for trunk for supine to sit and assistance with LEs for  sit to supine    Transfers       Sit to Stand: Min guard         General transfer comment: Patient was assist to stand from eob but patient had difficulty maintaining standing due to BLE knee buckling    Balance Overall balance assessment: Needs assistance Sitting-balance support: Feet supported Sitting balance-Leahy Scale: Good Sitting balance - Comments: close supervision for safety   Standing balance support: Bilateral upper extremity supported;During functional activity Standing balance-Leahy Scale: Poor Standing balance comment: Pt with walker and min assist to maintain balance.  Impulsive                           ADL either performed or assessed with clinical judgement   ADL Overall ADL's : Needs assistance/impaired     Grooming: Wash/dry hands;Wash/dry face;Oral care;Sitting;Supervision/safety;Minimal assistance Grooming Details (indicate cue type and reason): performed from eob                               General ADL Comments: Patient demonstrated jerky hand movements with difficulty loading toothbrush and spilling water     Vision       Perception     Praxis      Cognition Arousal/Alertness: Awake/alert Behavior During Therapy: Flat affect Overall Cognitive Status: Impaired/Different from baseline Area of Impairment: Orientation;Attention;Memory;Following commands;Safety/judgement;Awareness;Problem solving                 Orientation Level: Disoriented to;Situation Current Attention Level: Sustained Memory: Decreased recall of precautions;Decreased short-term memory Following Commands:  Follows one step commands inconsistently;Follows one step commands with increased time Safety/Judgement: Decreased awareness of safety;Decreased awareness of deficits Awareness: Intellectual Problem Solving: Slow processing;Decreased initiation;Requires verbal cues General Comments: A&Ox3 but was unsafe with standing         Exercises     Shoulder Instructions       General Comments      Pertinent Vitals/ Pain       Pain Assessment: Faces Faces Pain Scale: Hurts little more Pain Location: L hip Pain Descriptors / Indicators: Sore Pain Intervention(s): Repositioned  Home Living                                          Prior Functioning/Environment              Frequency  Min 2X/week        Progress Toward Goals  OT Goals(current goals can now be found in the care plan section)  Progress towards OT goals: Progressing toward goals  Acute Rehab OT Goals Patient Stated Goal: did not state OT Goal Formulation: With patient Time For Goal Achievement: 03/05/21 Potential to Achieve Goals: Good ADL Goals Pt Will Perform Grooming: with modified independence;standing Pt Will Perform Upper Body Bathing: with modified independence;standing Pt Will Perform Lower Body Bathing: with modified independence;sit to/from stand Pt Will Perform Upper Body Dressing: with modified independence;sitting Pt Will Perform Lower Body Dressing: with modified independence;sit to/from stand Pt Will Transfer to Toilet: with modified independence;ambulating;bedside commode Pt Will Perform Toileting - Clothing Manipulation and hygiene: with modified independence;sit to/from stand Pt Will Perform Tub/Shower Transfer: Tub transfer;rolling walker Pt/caregiver will Perform Home Exercise Program: Increased strength;Both right and left upper extremity;With theraband  Plan Discharge plan remains appropriate    Co-evaluation                 AM-PAC OT "6 Clicks" Daily Activity     Outcome Measure   Help from another person eating meals?: None Help from another person taking care of personal grooming?: None Help from another person toileting, which includes using toliet, bedpan, or urinal?: A Little Help from another person bathing (including washing, rinsing, drying)?: A Little Help from  another person to put on and taking off regular upper body clothing?: A Little Help from another person to put on and taking off regular lower body clothing?: A Little 6 Click Score: 20    End of Session Equipment Utilized During Treatment: Rolling walker;Oxygen  OT Visit Diagnosis: Unsteadiness on feet (R26.81)   Activity Tolerance Patient tolerated treatment well   Patient Left in bed;with call bell/phone within reach;with bed alarm set   Nurse Communication Mobility status;Other (comment)        Time: 2440-1027 OT Time Calculation (min): 27 min  Charges: OT General Charges $OT Visit: 1 Visit OT Treatments $Self Care/Home Management : 8-22 mins $Therapeutic Activity: 8-22 mins  Lodema Hong, OTA   Brigitta Pricer Alexis Goodell 02/26/2021, 10:21 AM

## 2021-02-26 NOTE — Progress Notes (Signed)
Physical Therapy Treatment Patient Details Name: [name] MRN: [id number] DOB: 1961-03-28 Today's Date: 02/26/2021   History of Present Illness 60 y.o. 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 Due to the pt's Coumadin she has had continual increased drainage from the wound and currently has a large amount of purulent drainage. Repeat debridement L hip ulcer 9/14, 9/16, 9/21; planned 9/23. 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    PT Comments    Patient received in bed, A&Ox2 and very confused today with need for quite a lot of extra processing time. Unable to tell me what she has had done on her hip or what month it is although she did know it was 2022. Tells me she feels "funny" today. Neuro screen reveals clonus present in R LE, BUE/LE MMT grossly tested in bed was symmetrical however once sitting at EOB she was unable to perform fine motor tasks (such as reciprocal finger tip to thumb taps) and was unable to hold onto RW especially with L UE, seems to be mildly ataxic as well. Did not feel comfortable attempting further mobility until cleared by MD- returned her to supine and immediately discussed case/relayed concerns to RN. Left in bed with all needs met, bed alarm active. Will continue efforts.    Recommendations for follow up therapy are one component of a multi-disciplinary discharge planning process, led by the attending physician.  Recommendations may be updated based on patient status, additional functional criteria and insurance authorization.  Follow Up Recommendations  SNF;Supervision/Assistance - 24 hour;Other (comment) (unless cognition/safety SIGNIFICANTLY improve following surgical POC)     Equipment Recommendations  Rolling walker with 5" wheels;3in1 (PT)    Recommendations for Other Services       Precautions / Restrictions Precautions Precautions:  Fall Precaution Comments: wound vac Restrictions Weight Bearing Restrictions: Yes LLE Weight Bearing: Weight bearing as tolerated     Mobility  Bed Mobility Overal bed mobility: Needs Assistance Bed Mobility: Supine to Sit;Sit to Supine     Supine to sit: HOB elevated;Min guard Sit to supine: HOB elevated;Min guard   General bed mobility comments: extra time for processing and line/lead management, no physical assist given but did need multimodal cues    Transfers       Sit to Stand: Min guard         General transfer comment: unable  Ambulation/Gait             General Gait Details: unable   Stairs             Wheelchair Mobility    Modified Rankin (Stroke Patients Only)       Balance Overall balance assessment: Needs assistance Sitting-balance support: Feet supported Sitting balance-Leahy Scale: Good Sitting balance - Comments: close supervision for safety   Standing balance support: Bilateral upper extremity supported;During functional activity Standing balance-Leahy Scale: Poor Standing balance comment: Pt with walker and min assist to maintain balance.  Impulsive                            Cognition Arousal/Alertness: Awake/alert Behavior During Therapy: Flat affect Overall Cognitive Status: Impaired/Different from baseline Area of Impairment: Orientation;Attention;Memory;Following commands;Safety/judgement;Awareness;Problem solving                 Orientation Level: Disoriented to;Situation;Time Current Attention Level: Focused Memory: Decreased recall of precautions;Decreased short-term memory Following Commands: Follows  one step commands inconsistently;Follows one step commands with increased time Safety/Judgement: Decreased awareness of safety;Decreased awareness of deficits Awareness: Intellectual Problem Solving: Slow processing;Decreased initiation;Requires verbal cues General Comments: only A&Ox2 today, STM  limited to just 2-3 seconds, cue and command following quite inconsistent today and needed a lot of extra time for processing      Exercises      General Comments        Pertinent Vitals/Pain Pain Assessment: Faces Faces Pain Scale: No hurt Pain Location: L hip Pain Descriptors / Indicators: Sore Pain Intervention(s): Monitored during session;Limited activity within patient's tolerance    Home Living                      Prior Function            PT Goals (current goals can now be found in the care plan section) Acute Rehab PT Goals Patient Stated Goal: did not state PT Goal Formulation: With patient Time For Goal Achievement: 03/05/21 Potential to Achieve Goals: Fair Progress towards PT goals: Not progressing toward goals - comment (cognition)    Frequency    Min 3X/week      PT Plan Current plan remains appropriate    Co-evaluation              AM-PAC PT "6 Clicks" Mobility   Outcome Measure  Help needed turning from your back to your side while in a flat bed without using bedrails?: A Little Help needed moving from lying on your back to sitting on the side of a flat bed without using bedrails?: A Little Help needed moving to and from a bed to a chair (including a wheelchair)?: A Lot Help needed standing up from a chair using your arms (e.g., wheelchair or bedside chair)?: A Lot Help needed to walk in hospital room?: A Lot Help needed climbing 3-5 steps with a railing? : Total 6 Click Score: 13    End of Session Equipment Utilized During Treatment: Gait belt Activity Tolerance: Patient tolerated treatment well Patient left: in bed;with call bell/phone within reach;with bed alarm set Nurse Communication: Mobility status PT Visit Diagnosis: Difficulty in walking, not elsewhere classified (R26.2);Muscle weakness (generalized) (M62.81);Unsteadiness on feet (R26.81)     Time: 2800-3491 PT Time Calculation (min) (ACUTE ONLY): 20  min  Charges:  $Therapeutic Activity: 8-22 mins                    Windell Norfolk, DPT, PN2   Supplemental Physical Therapist [place]    Pager [phone] Acute Rehab Office [phone]

## 2021-02-26 NOTE — Progress Notes (Signed)
ANTICOAGULATION CONSULT NOTE - Follow Up Consult  Pharmacy Consult:  Coumadin Indication: Afib and St. Jude MVR/AVR  No Known Allergies  Patient Measurements: Height: 5' 6.5" (168.9 cm) Weight: 128 kg (282 lb 3 oz) IBW/kg (Calculated) : 60.45  Vital Signs: Temp: 97.7 F (36.5 C) (09/29 0812) Temp Source: Oral (09/29 0812) BP: 135/66 (09/29 0812) Pulse Rate: 71 (09/29 0812)  Labs: Recent Labs    02/24/21 0403 02/25/21 0512 02/26/21 0421  HGB  --   --  9.1*  HCT  --   --  30.1*  LABPROT 18.7* 21.3* 23.7*  INR 1.6* 1.9* 2.1*  CREATININE  --   --  4.54*     Estimated Creatinine Clearance: 18 mL/min (A) (by C-G formula based on SCr of 4.54 mg/dL (H)).   Medical History:   Assessment: 60 yo F with left hip wound infection s/p multiple I&Ds and wound vac application.  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  Pharmacy consulted to resume Coumadin on 9/24.   INR 2.1 is subtherapeutic. Of note, patient is starting ciprofloxacin which in combination with warfarin may increase INR. Other DDI include allopurinol and citalopram but these a prior to admission medications.   PTA Warfarin Dose: 3.75 MG daily except 7.5 mg MWF  Goal of Therapy:  INR 2.5-3.5 Monitor platelets by anticoagulation protocol: Yes   Plan:  Warfarin 5mg  x1 today  Daily PT / INR Monitor for bleeding   Cristela Felt, PharmD, BCPS Clinical Pharmacist 02/26/2021 9:39 AM

## 2021-02-26 NOTE — Progress Notes (Signed)
Patient is status post multiple irrigation and debridements of her left hip wound.  She is lying in bed today.  No complaints.  She states she is not taking any pain medication.  Vital signs stable afebrile alert pleasant to exam.  Did receive a unit of blood yesterday.  Hemoglobin is 9.1 hematocrit 30.1.  The med does show an increased BUN of 44 and a creatinine of 4.54.  We will discuss this with Dr. Sharol Given for any further recommendations

## 2021-02-26 NOTE — TOC Initial Note (Signed)
Transition of Care [facility]) - Initial/Assessment Note    Patient Details  Name: [name] MRN: [id number] Date of Birth: 12/22/1960  [facility]) CM/SW Contact:    Emeterio Reeve, LCSW Phone Number: 02/26/2021, 4:27 PM  Clinical Narrative:                  Pt lives at home with her sister, Pts sister works during the day. Pt refers to return home. CSW asked about plan for when sister is at work. Pt reports she was independent PTA.   Pt stated she was fine with being sent out for snf. Pt was given medicare.gov list. Pt has no preference. CSW gave pt her bed offers. Pt stated she will talk to her sister and review SNF choices.  Expected Discharge Plan: Skilled Nursing Facility Barriers to Discharge: Continued Medical Work up   Patient Goals and CMS Choice Patient states their goals for this hospitalization and ongoing recovery are:: to get better CMS Medicare.gov Compare Post Acute Care list provided to:: Patient Choice offered to / list presented to : Patient  Expected Discharge Plan and Services Expected Discharge Plan: [place]       Living arrangements for the past 2 months: Single Family Home                                      Prior Living Arrangements/Services Living arrangements for the past 2 months: Single Family Home Lives with:: Relatives Patient language and need for interpreter reviewed:: Yes Do you feel safe going back to the place where you live?: Yes      Need for Family Participation in Patient Care: Yes (Comment) Care giver support system in place?: Yes (comment)   Criminal Activity/Legal Involvement Pertinent to Current Situation/Hospitalization: No - Comment as needed  Activities of Daily Living Home Assistive Devices/Equipment: Oxygen, Eyeglasses ADL Screening (condition at time of admission) Patient's cognitive ability adequate to safely complete daily activities?: Yes Is the patient deaf or have  difficulty hearing?: No Does the patient have difficulty seeing, even when wearing glasses/contacts?: No Does the patient have difficulty concentrating, remembering, or making decisions?: No Patient able to express need for assistance with ADLs?: Yes Does the patient have difficulty dressing or bathing?: No Independently performs ADLs?: Yes (appropriate for developmental age) Does the patient have difficulty walking or climbing stairs?: No Weakness of Legs: None Weakness of Arms/Hands: None  Permission Sought/Granted Permission sought to share information with : Family Supports, Chartered certified accountant granted to share information with : Yes, Verbal Permission Granted     Permission granted to share info w AGENCY: SNF        Emotional Assessment Appearance:: Appears stated age Attitude/Demeanor/Rapport: Engaged Affect (typically observed): Appropriate Orientation: : Oriented to Self, Oriented to Place, Oriented to  Time, Oriented to Situation Alcohol / Substance Use: Not Applicable Psych Involvement: No (comment)  Admission diagnosis:  Hardware complicating wound infection ([place]) [T84.7XXA] Patient Active Problem List   Diagnosis Date Noted   Hardware complicating wound infection ([place]) 02/04/2021   Acute osteomyelitis, pelvis, left (HCC)    Postoperative hematoma of musculoskeletal structure following musculoskeletal procedure 01/19/2021   Hematoma of abdominal wall 01/03/2021   Hypovolemic shock ([place]) 12/30/2020   History of bariatric surgery 04/18/2018   Abdominal pain 02/02/2018   Hyperkalemia 02/02/2018   Acute kidney injury superimposed on CKD ([place]) 02/02/2018  Anemia    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 02/01/2018   Postoperative anemia due to chronic blood loss on full anticoagulation 02/01/2018   AKI (acute kidney injury) ([place]) 02/01/2018   Acute renal failure superimposed on stage 3 chronic kidney disease ([place]) 01/26/2018   PAF (paroxysmal  atrial fibrillation) (HCC)    Acute respiratory failure ([place])    Hypoxia    Postprocedural hypotension    Chronic acquired lymphedema - Right lower extremity 01/24/2018   Hx of mechanical aortic valve replacement 01/24/2018   Morbid obesity ([place]) 01/24/2018   Obstructive sleep apnea 02/03/2017   Chronic diastolic heart failure ([place]) 01/11/2017   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   HX: long term anticoagulant use 02/11/2016   Chronic respiratory failure ([place]) 08/12/2015   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   Allergic rhinitis 05/02/2015   Symptomatic anemia 04/08/2015   History of mitral valve replacement with mechanical valve 04/08/2015   CKD (chronic kidney disease) stage 3, GFR 30-59 ml/min (HCC) 04/08/2015   Atrial flutter ([place])    Chronic anticoagulation 09/04/2014   Warfarin anticoagulation 08/14/2014   CHB (complete heart block) ([place]) 08/14/2014   First degree AV block 08/14/2014   Hypercarbia 08/07/2014   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   ILD (interstitial lung disease) ([place]) 10/12/2013   Aortic stenosis 10/02/2013   Hyperlipidemia 10/02/2013   Chronic asthmatic bronchitis ([place]) 08/06/2013   Gout 04/16/2011   Essential hypertension 10/07/2010   Depressive disorder, not elsewhere classified 10/07/2010   Major depressive disorder, single episode 10/07/2010   Impaired fasting glucose 04/01/2010   Vitamin D deficiency 04/01/2010   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 [facility] [place] [address] Phone: [phone] Fax: [phone]     Social Determinants of Health (SDOH) Interventions    Readmission Risk Interventions No flowsheet data found.  Emeterio Reeve, LCSW Clinical Social Worker

## 2021-02-26 NOTE — Progress Notes (Signed)
Pt's sister called and is asking for a urinalysis as part of a workup for increased altered mental status from baseline. says pt has been this way for a week or so. PT also saw patient and recommended a CT, MD text paged
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
## 2021-02-27 DIAGNOSIS — M86152 Other acute osteomyelitis, left femur: Secondary | ICD-10-CM | POA: Diagnosis not present

## 2021-02-27 LAB — BASIC METABOLIC PANEL
Anion gap: 7 (ref 5–15)
BUN: 49 mg/dL — ABNORMAL HIGH (ref 8–23)
CO2: 15 mmol/L — ABNORMAL LOW (ref 22–32)
Calcium: 11.4 mg/dL — ABNORMAL HIGH (ref 8.9–10.3)
Chloride: 115 mmol/L — ABNORMAL HIGH (ref 98–111)
Creatinine, Ser: 5.05 mg/dL — ABNORMAL HIGH (ref 0.44–1.00)
GFR, Estimated: 9 mL/min — ABNORMAL LOW (ref >60)
Glucose, Bld: 81 mg/dL (ref 70–99)
Potassium: 4.6 mmol/L (ref 3.5–5.1)
Sodium: 137 mmol/L (ref 135–145)

## 2021-02-27 LAB — CBC
HCT: 29.8 % — ABNORMAL LOW (ref 36.0–46.0)
Hemoglobin: 9 g/dL — ABNORMAL LOW (ref 12.0–15.0)
MCH: 30.1 pg (ref 26.0–34.0)
MCHC: 30.2 g/dL (ref 30.0–36.0)
MCV: 99.7 fL (ref 80.0–100.0)
Platelets: 169 10*3/uL (ref 150–400)
RBC: 2.99 MIL/uL — ABNORMAL LOW (ref 3.87–5.11)
RDW: 16.8 % — ABNORMAL HIGH (ref 11.5–15.5)
WBC: 9.1 10*3/uL (ref 4.0–10.5)
nRBC: 0 % (ref 0.0–0.2)

## 2021-02-27 LAB — PROTIME-INR
INR: 2.5 — ABNORMAL HIGH (ref 0.8–1.2)
Prothrombin Time: 27.2 seconds — ABNORMAL HIGH (ref 11.4–15.2)

## 2021-02-27 LAB — SODIUM, URINE, RANDOM: Sodium, Ur: 74 mmol/L

## 2021-02-27 LAB — CREATININE, URINE, RANDOM: Creatinine, Urine: 59.38 mg/dL

## 2021-02-27 MED ORDER — WARFARIN SODIUM 2.5 MG PO TABS
3.7500 mg | ORAL_TABLET | Freq: Once | ORAL | Status: AC
  Administered 2021-02-27: 3.75 mg via ORAL
  Filled 2021-02-27: qty 1

## 2021-02-27 MED ORDER — SENNA 8.6 MG PO TABS
1.0000 | ORAL_TABLET | Freq: Every day | ORAL | Status: DC
  Administered 2021-02-27 – 2021-03-03 (×4): 8.6 mg via ORAL
  Filled 2021-02-27 (×5): qty 1

## 2021-02-27 MED ORDER — SODIUM BICARBONATE 8.4 % IV SOLN
INTRAVENOUS | Status: DC
  Filled 2021-02-27: qty 1000

## 2021-02-27 MED ORDER — STERILE WATER FOR INJECTION IV SOLN
INTRAVENOUS | Status: DC
  Filled 2021-02-27 (×7): qty 1000

## 2021-02-27 MED ORDER — CHLORHEXIDINE GLUCONATE CLOTH 2 % EX PADS
6.0000 | MEDICATED_PAD | Freq: Every day | CUTANEOUS | Status: DC
  Administered 2021-02-28 – 2021-03-04 (×5): 6 via TOPICAL

## 2021-02-27 NOTE — Progress Notes (Signed)
Consult follow-up  [name] is an 60 y.o. female with h/o atrial flutter/AVR on Coumadin; chronic diastolic CHF; stage 3 CKD; HTN; HLD; OHS on home O2; and morbid obesity who was admitted on 9/7 for wound infection following iliac crest bone harvesting on 7/29.   Patient underwent several surgeries as detailed below and was nearing discharge but renal function worsened in addition to mental status she was also transfused  Surgeries during this hospitalization: 9/7 - Wound debridement, antibiotic beads, wound vac 9/14 - Repeat debridement, wound vac 9/16 - Repeat debridement, wound vac 9/21 - repeat debridement, antibiotic beads, wound vac 9/23 - Repeat debridement, wound vac  S Quite sleepy intermittently is able to make sense but falls asleep midsentence Nurse reports has not given much meds  On exam BP 125/75   Pulse 73   Temp 98.3 F (36.8 C) (Oral)   Resp 18   Ht 5' 6.5" (1.689 m)   Wt 128 kg   SpO2 93%   BMI 44.86 kg/m  BUN/creatinine 46/4.8-->49/5.0, CO2 15 Hemoglobin 9 WBC 9 platelet 169 INR 2.5 CBG 81-70 UA small hemoglobin rare bacteria   EOMI NCAT no focal deficit but overall sleepy and lethargic at times ROM intact but is sleepy and not completely cooperative CTA B no rales rhonchi Patient has wound VAC in place left lower quadrant/left hip Trace to 1+ lower extremity edema     Toxic metabolic encephalopathy likely secondary to polypharmacy Discontinued all psychotropics, scopolamine, Dilaudid, oxycodone Reglan etc. in the setting of renal dysfunction Mentation will likely improve but if does not we will search for other causes such as CO2 narcosis and get a blood gas AKI/ATN with component of postobstructive uropathy Urine sodium urine creatinine obtained to check FeNA Bladder scan revealed 512 being retained and 750 put out after in and out We will BladderScan again-if continues to retain will need indwelling Foley  start saline 75 cc/H Lasix  Avapro held-check labs only once daily Pelvic osteomyelitis Continue Cipro 500 daily-defer wound care etc. to orthopedics HTN, chronic diastolic HFpEF Ava pro 315 held, Lasix 40 a.m., 20 p.m. held Okay to continue metoprolol 50 daily No component of acute heart failure at this time Atrial fibrillation on Coumadin CHADS2 score >4 Rate control with Coumadin therapeutic currently Depression Until mental status clears would hold bupropion 300 XL as well as Celexa 20 daily and resume cautiously with improvement OHSS OSA on home O2 See above discussion may need to check CO2 if continues to be sleepy  D/w Rachael Fee [phone] Inpatient pending recovery Will follow in am

## 2021-02-27 NOTE — Progress Notes (Signed)
Physical Therapy Treatment Patient Details Name: [name] MRN: [id number] DOB: [birth year] Today's Date: 02/27/2021   History of Present Illness 60 y.o. 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 Due to the pt's Coumadin she has had continual increased drainage from the wound and currently has a large amount of purulent drainage. Repeat debridement L hip ulcer 9/14, 9/16, 9/21; planned 9/23. 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    PT Comments    Patient received in bed, A&Ox2 and very functionally confused. Needed repeated simple multimodal cues and hand over hand assist for sequencing today and had a very hard time following commands. Perseverated on grabbing and holding onto lines. Able to get to standing at EOB but knees buckled and she sat right back down- unable to safely get to chair today due to poor cognition/command following and impulsivity.  Left in bed with all needs met and bed alarm active. Will continue efforts, has been very difficult to progress recently due to cognition.   Recommendations for follow up therapy are one component of a multi-disciplinary discharge planning process, led by the attending physician.  Recommendations may be updated based on patient status, additional functional criteria and insurance authorization.  Follow Up Recommendations  SNF;Supervision/Assistance - 24 hour;Other (comment) (unless cognition/safety SIGNIFICANTLY IMPROVE)     Equipment Recommendations  Rolling walker with 5" wheels;3in1 (PT)    Recommendations for Other Services       Precautions / Restrictions Precautions Precautions: Fall Restrictions Weight Bearing Restrictions: Yes LLE Weight Bearing: Weight bearing as tolerated     Mobility  Bed Mobility Overal bed mobility: Needs Assistance Bed Mobility: Supine to Sit;Sit to Supine     Supine to sit: HOB  elevated;Mod assist Sit to supine: Mod assist;+2 for physical assistance   General bed mobility comments: ModAx1 to get to EOB with more help needed to sequence and square up at EOB; needed ModAx2 to return to supine due to cognition    Transfers Overall transfer level: Needs assistance Equipment used: Rolling walker (2 wheeled) Transfers: Sit to/from Stand Sit to Stand: Max assist         General transfer comment: MaxA to boost to full upright standing, only able to maintain for about 3 seconds before impulsively sitting back down. Much more difficult to perform transfers today due to cognition and limited sequencing.  Ambulation/Gait             General Gait Details: unable   Stairs             Wheelchair Mobility    Modified Rankin (Stroke Patients Only)       Balance Overall balance assessment: Needs assistance Sitting-balance support: Feet supported Sitting balance-Leahy Scale: Good Sitting balance - Comments: close supervision for safety   Standing balance support: Bilateral upper extremity supported;During functional activity Standing balance-Leahy Scale: Poor Standing balance comment: Pt with walker and min assist to maintain balance.  Impulsive                            Cognition Arousal/Alertness: Awake/alert Behavior During Therapy: Flat affect;Impulsive;Restless Overall Cognitive Status: Impaired/Different from baseline Area of Impairment: Orientation;Attention;Memory;Following commands;Safety/judgement;Awareness;Problem solving                 Orientation Level: Disoriented to;Time;Situation Current Attention Level: Focused Memory: Decreased recall of precautions;Decreased short-term memory Following Commands: Follows one step commands  inconsistently;Follows one step commands with increased time Safety/Judgement: Decreased awareness of safety;Decreased awareness of deficits Awareness: Intellectual Problem Solving: Slow  processing;Decreased initiation;Requires verbal cues General Comments: still only A&Ox2, cognition much worse than last session. Needed repeated, frequent multimodal cues for simple tasks as well as frequent redirection away from lines. Lots of time needed for processing and max cues for sequencing today.      Exercises      General Comments        Pertinent Vitals/Pain Pain Assessment: Faces Faces Pain Scale: No hurt Pain Intervention(s): Limited activity within patient's tolerance;Monitored during session    Home Living                      Prior Function            PT Goals (current goals can now be found in the care plan section) Acute Rehab PT Goals Patient Stated Goal: did not state PT Goal Formulation: With patient Time For Goal Achievement: 03/05/21 Potential to Achieve Goals: Fair Progress towards PT goals: Not progressing toward goals - comment (limited by cognition/safety)    Frequency    Min 3X/week      PT Plan Current plan remains appropriate    Co-evaluation              AM-PAC PT "6 Clicks" Mobility   Outcome Measure  Help needed turning from your back to your side while in a flat bed without using bedrails?: A Little Help needed moving from lying on your back to sitting on the side of a flat bed without using bedrails?: A Lot Help needed moving to and from a bed to a chair (including a wheelchair)?: A Lot Help needed standing up from a chair using your arms (e.g., wheelchair or bedside chair)?: A Lot Help needed to walk in hospital room?: Total Help needed climbing 3-5 steps with a railing? : Total 6 Click Score: 11    End of Session Equipment Utilized During Treatment: Gait belt Activity Tolerance: Patient tolerated treatment well;Other (comment) (limited by cognition/ability to follow cues) Patient left: in bed;with call bell/phone within reach;with bed alarm set Nurse Communication: Mobility status PT Visit Diagnosis:  Difficulty in walking, not elsewhere classified (R26.2);Muscle weakness (generalized) (M62.81);Unsteadiness on feet (R26.81)     Time: 5945-8592 PT Time Calculation (min) (ACUTE ONLY): 28 min  Charges:  $Therapeutic Activity: 23-37 mins                    Windell Norfolk, DPT, PN2   Supplemental Physical Therapist [place]    Pager [phone] Acute Rehab Office [phone]

## 2021-02-27 NOTE — TOC Progression Note (Signed)
Transition of Care [facility]) - Progression Note    Patient Details  Name: [name] MRN: [id number] Date of Birth: June 10, 1960  Transition of Care [facility]) CM/SW Contact  Emeterio Reeve, [place] Phone Number: 02/27/2021, 5:08 PM  Clinical Narrative:     CSW gave pt additional bed offers. Pt states she does not like the bed offers. Pt states she wants to go home. Pt reports she lives with her sister but her sister works during the day. Pt is unsure if she has anyone that can help during the day.   CSW made PA aware, PA states she is not medically ready and will have a few more days to make a decision.   Expected Discharge Plan: [place] Barriers to Discharge: Continued Medical Work up  Expected Discharge Plan and Services Expected Discharge Plan: [place] arrangements for the past 2 months: Single Family Home                                       Social Determinants of Health (SDOH) Interventions    Readmission Risk Interventions No flowsheet data found.  Emeterio Reeve, LCSW Clinical Social Worker

## 2021-02-27 NOTE — Consult Note (Signed)
   [facility] CM Inpatient Consult   02/27/2021  [name] 1960/08/25 [id number]  [place] [facility] [ACO] Patient: Medicare CMS DCE  Follow up:  LLOS and progress  Patient is being recommended for a skilled nursing facility level of care.  Plan:  Continue to follow, if patient goes to a [facility] affiliated facility with traditional Medicare, can have post hospital to SNF follow up with the [facility] RN.  For questions,   Natividad Brood, RN BSN [place] [facility] Liaison  [phone] business mobile phone Toll free office [phone]  Fax number: [phone] [email or web address]

## 2021-02-27 NOTE — Progress Notes (Signed)
ANTICOAGULATION CONSULT NOTE - Follow Up Consult  Pharmacy Consult:  Coumadin Indication: Afib and St. Jude MVR/AVR  No Known Allergies  Patient Measurements: Height: 5' 6.5" (168.9 cm) Weight: 128 kg (282 lb 3 oz) IBW/kg (Calculated) : 60.45  Vital Signs: Temp: 98.3 F (36.8 C) (09/30 0604) Temp Source: Oral (09/30 0604) BP: 127/89 (09/30 0604) Pulse Rate: 69 (09/30 0604)  Labs: Recent Labs    02/25/21 0512 02/26/21 0421 02/26/21 1656 02/27/21 0402  HGB  --  9.1*  --  9.0*  HCT  --  30.1*  --  29.8*  PLT  --   --   --  169  LABPROT 21.3* 23.7*  --  27.2*  INR 1.9* 2.1*  --  2.5*  CREATININE  --  4.54* 4.87* 5.05*     Estimated Creatinine Clearance: 16.2 mL/min (A) (by C-G formula based on SCr of 5.05 mg/dL (H)).   Medical History:   Assessment: 60 yo F with left hip wound infection s/p multiple I&Ds and wound vac application.  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  Pharmacy consulted to resume Coumadin on 9/24.   INR 2.5 is therapeutic. Of note, patient is starting ciprofloxacin on 9/28 which in combination with warfarin may increase INR. Other DDI include allopurinol and citalopram but these a prior to admission medications. Hgb 9.0 Plt wnl. No bleeding noted.   PTA Warfarin Dose: 3.75 MG daily except 7.5 mg MWF  Goal of Therapy:  INR 2.5-3.5 Monitor platelets by anticoagulation protocol: Yes   Plan:  Warfarin 3.75mg  x1 today  Daily PT / INR Monitor for bleeding   Cristela Felt, PharmD, BCPS Clinical Pharmacist 02/27/2021 8:34 AM

## 2021-02-28 ENCOUNTER — Inpatient Hospital Stay (HOSPITAL_COMMUNITY): Payer: Medicare Other

## 2021-02-28 DIAGNOSIS — M86152 Other acute osteomyelitis, left femur: Secondary | ICD-10-CM | POA: Diagnosis not present

## 2021-02-28 LAB — BASIC METABOLIC PANEL
Anion gap: 6 (ref 5–15)
BUN: 49 mg/dL — ABNORMAL HIGH (ref 8–23)
CO2: 19 mmol/L — ABNORMAL LOW (ref 22–32)
Calcium: 10.6 mg/dL — ABNORMAL HIGH (ref 8.9–10.3)
Chloride: 111 mmol/L (ref 98–111)
Creatinine, Ser: 5.17 mg/dL — ABNORMAL HIGH (ref 0.44–1.00)
GFR, Estimated: 9 mL/min — ABNORMAL LOW (ref >60)
Glucose, Bld: 77 mg/dL (ref 70–99)
Potassium: 4 mmol/L (ref 3.5–5.1)
Sodium: 136 mmol/L (ref 135–145)

## 2021-02-28 LAB — PROTIME-INR
INR: 2.6 — ABNORMAL HIGH (ref 0.8–1.2)
Prothrombin Time: 28.2 seconds — ABNORMAL HIGH (ref 11.4–15.2)

## 2021-02-28 MED ORDER — WARFARIN SODIUM 2.5 MG PO TABS
3.7500 mg | ORAL_TABLET | Freq: Once | ORAL | Status: AC
  Administered 2021-02-28: 3.75 mg via ORAL
  Filled 2021-02-28: qty 1

## 2021-02-28 MED ORDER — LEVOFLOXACIN 500 MG PO TABS
500.0000 mg | ORAL_TABLET | ORAL | Status: DC
  Administered 2021-03-01 – 2021-03-03 (×2): 500 mg via ORAL
  Filled 2021-02-28 (×4): qty 1

## 2021-02-28 NOTE — Progress Notes (Signed)
Consult follow-up  [name] is an 60 y.o. female with h/o atrial flutter/AVR on Coumadin; chronic diastolic CHF; stage 3 CKD; HTN; HLD; OHS on home O2; and morbid obesity who was admitted on 9/7 for wound infection following iliac crest bone harvesting on 7/29.   Patient underwent several surgeries as detailed below and was nearing discharge but renal function worsened in addition to mental status she was also transfused  Surgeries during this hospitalization: 9/7 - Wound debridement, antibiotic beads, wound vac 9/14 - Repeat debridement, wound vac 9/16 - Repeat debridement, wound vac 9/21 - repeat debridement, antibiotic beads, wound vac 9/23 - Repeat debridement, wound vac  S More awake interactive not in a whole lot more distress Foley catheter was placed yesterday and she is draining good urine She is eating breakfast at the bedside and her 2 daughters are here She has no overt pain chills fever nausea vomiting  On exam BP (!) 129/100 (BP Location: Right Arm)   Pulse (!) 101   Temp 98 F (36.7 C) (Oral)   Resp 16   Ht 5' 6.5" (1.689 m)   Wt 128 kg   SpO2 98%   BMI 44.86 kg/m  BUN/creatinine 46/4.8-->49/5.0-->49/5.17, CO2 19  INR 2.6 CBG 70s to 90s Renal ultrasound shows increased cortical echogenicity with medical renal disease and nondistended bladder   EOMI NCAT no focal deficit much more coherent ROM intact  cooperative moving 4 limbs equally S1-S2 no murmur, mild JVD CTA B no rales rhonchi but poor exam Patient has wound VAC in place left lower quadrant/left hip Trace to 1+ lower extremity edema     Toxic metabolic encephalopathy likely secondary to polypharmacy Mentation improved with de-escalation of polypharmacy Gradual reintroduction of pain meds if there is a need AKI/ATN with component of postobstructive uropathy FENa is 4.7 indicative of postobstructive uropathy Foley is in place after 512 cc of urine being found on bladder scan I discussed  the case with Dr. Joylene Grapes of nephrology -another consideration is ciprofloxacin causing crystallization + AKI-- change medication to alternative and see if this makes a difference Renal ultrasound 10/1 is negative for any specific findings Lasix Avapro held-check labs QD Pelvic osteomyelitis defer wound care etc. to orthopedics Antibiotics will be adjusted Levaquin 500 q 48 HTN, chronic diastolic HFpEF Avapro 390 held, Lasix 40 a.m., 20 p.m. held continue metoprolol 50 daily Start amlodipine if pressure remains uncontrolled No component of acute heart failure at this time Atrial fibrillation on Coumadin CHADS2 score >4 Rate control with Coumadin therapeutic currently Depression hold bupropion 300 XL as well as Celexa 20 daily for now Can resume in 24-48 hours if creatinine improves and mentation stays better OHSS OSA on home O2 See above discussion may need to check CO2 if continues to be sleepy  D/w daughter [name] [phone] at bedside 10/1 Inpatient pending recovery Will follow and assist , thanks for consult

## 2021-02-28 NOTE — TOC Progression Note (Addendum)
Transition of Care [facility]) - Progression Note    Patient Details  Name: [name] MRN: [id number] Date of Birth: Oct 09, 1960  Transition of Care [facility]) CM/SW Norman, [place] Phone Number: 02/28/2021, 12:00 PM  Clinical Narrative:     CSW spoke with patients daughter Alm Bustard who confirmed that the plan for patient is to return home. Patients daughter reports that patient comes from home with sister. Patients daughter reports that patients sister works during the day but that patients daughter is working on making sure that patient has 24/7 supervision when it is time to return home. Patient daughter is interested in home health services for patient. CSW informed case Freight forwarder. No further questions reported at this time.    Expected Discharge Plan: [place] Barriers to Discharge: Continued Medical Work up  Expected Discharge Plan and Services Expected Discharge Plan: [place] arrangements for the past 2 months: Single Family Home                                       Social Determinants of Health (SDOH) Interventions    Readmission Risk Interventions No flowsheet data found.

## 2021-02-28 NOTE — Progress Notes (Signed)
ANTICOAGULATION CONSULT NOTE - Follow Up Consult  Pharmacy Consult:  Coumadin Indication: Afib and St. Jude MVR/AVR  No Known Allergies  Patient Measurements: Height: 5' 6.5" (168.9 cm) Weight: 128 kg (282 lb 3 oz) IBW/kg (Calculated) : 60.45  Vital Signs: Temp: 97.8 F (36.6 C) (10/01 0340) Temp Source: Oral (10/01 0340) BP: 129/69 (10/01 0340) Pulse Rate: 73 (10/01 0340)  Labs: Recent Labs    02/26/21 0421 02/26/21 1656 02/27/21 0402 02/28/21 0249  HGB 9.1*  --  9.0*  --   HCT 30.1*  --  29.8*  --   PLT  --   --  169  --   LABPROT 23.7*  --  27.2* 28.2*  INR 2.1*  --  2.5* 2.6*  CREATININE 4.54* 4.87* 5.05* 5.17*     Estimated Creatinine Clearance: 15.8 mL/min (A) (by C-G formula based on SCr of 5.17 mg/dL (H)).   Medical History:   Assessment: 60 yo F with left hip wound infection s/p multiple I&Ds and wound vac application.  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  Pharmacy consulted to resume Coumadin on 9/24.   INR 2.6 is therapeutic. Of note, patient started ciprofloxacin on 9/28 which in combination with warfarin may increase INR. Other DDI include allopurinol and citalopram but these a prior to admission medications. Hgb 9.0 Plt wnl. No bleeding noted.   PTA Warfarin Dose: 3.75 MG daily except 7.5 mg MWF  Goal of Therapy:  INR 2.5-3.5 Monitor platelets by anticoagulation protocol: Yes   Plan:  Warfarin 3.75mg  x1 today  Daily PT / INR Monitor for bleeding   Lestine Box, PharmD PGY2 Infectious Diseases Pharmacy Resident   Please check [email or web address] for unit-specific pharmacy phone numbers

## 2021-02-28 NOTE — Progress Notes (Signed)
Pt refusing CPAP

## 2021-02-28 NOTE — Progress Notes (Signed)
Patient ID: [name], female   DOB: 1961-03-28, 60 y.o.   MRN: [id number] Patient much more alert and oriented this morning family with her at bedside.  There is 375 cc of clear serosanguineous drainage in the wound VAC canister.  Creatinine is at 5.17.  INR 2.6.  Patient returned from ultrasound of her kidneys this morning.  Thank you for hospitalist consultation.

## 2021-02-28 NOTE — TOC Progression Note (Signed)
Transition of Care [facility]) - Progression Note    Patient Details  Name: [name] MRN: [id number] Date of Birth: 08-10-1960  Transition of Care [facility]) CM/SW Contact  Ina Homes, [place] Phone Number: 02/28/2021, 10:55 AM  Clinical Narrative:     SW met with pt and sister [name] at bedside to f/u on SNF decision. Pt's sister [name] reports pt is doing better and more coherent today. [name] reports preference is home with [facility] services or CIR. SW explained CIR criteria and need for support and pt being able to tolerate more intensive therapy. [name] verbalized understanding. [name] confirmed she does work during the day and pt would be alone with check ins sometimes. [name] reports she would not want pt d/c home if she did not feel pt would be safe alone. [name] wants SNF as last resort but hopeful with more therapy while in hospital pt will be able to go home. SW provided list of current BO's again for pt/Donna to review). [name] reports pt has access to r/w, and O2 concentrator ([employer]). [name] reports there are no STE and pt would likely benefit from [facility].   Expected Discharge Plan: [place] Barriers to Discharge: Continued Medical Work up  Expected Discharge Plan and Services Expected Discharge Plan: [place] arrangements for the past 2 months: Single Family Home                                       Social Determinants of Health (SDOH) Interventions    Readmission Risk Interventions No flowsheet data found.

## 2021-03-01 DIAGNOSIS — M86152 Other acute osteomyelitis, left femur: Secondary | ICD-10-CM | POA: Diagnosis not present

## 2021-03-01 LAB — RENAL FUNCTION PANEL
Albumin: 2.4 g/dL — ABNORMAL LOW (ref 3.5–5.0)
Anion gap: 8 (ref 5–15)
BUN: 49 mg/dL — ABNORMAL HIGH (ref 8–23)
CO2: 23 mmol/L (ref 22–32)
Calcium: 10.2 mg/dL (ref 8.9–10.3)
Chloride: 107 mmol/L (ref 98–111)
Creatinine, Ser: 5.09 mg/dL — ABNORMAL HIGH (ref 0.44–1.00)
GFR, Estimated: 9 mL/min — ABNORMAL LOW (ref >60)
Glucose, Bld: 86 mg/dL (ref 70–99)
Phosphorus: 3.4 mg/dL (ref 2.5–4.6)
Potassium: 3.6 mmol/L (ref 3.5–5.1)
Sodium: 138 mmol/L (ref 135–145)

## 2021-03-01 LAB — CBC
HCT: 27.6 % — ABNORMAL LOW (ref 36.0–46.0)
Hemoglobin: 8.5 g/dL — ABNORMAL LOW (ref 12.0–15.0)
MCH: 29.7 pg (ref 26.0–34.0)
MCHC: 30.8 g/dL (ref 30.0–36.0)
MCV: 96.5 fL (ref 80.0–100.0)
Platelets: 134 10*3/uL — ABNORMAL LOW (ref 150–400)
RBC: 2.86 MIL/uL — ABNORMAL LOW (ref 3.87–5.11)
RDW: 17 % — ABNORMAL HIGH (ref 11.5–15.5)
WBC: 7.9 10*3/uL (ref 4.0–10.5)
nRBC: 0 % (ref 0.0–0.2)

## 2021-03-01 LAB — PROTIME-INR
INR: 2.1 — ABNORMAL HIGH (ref 0.8–1.2)
Prothrombin Time: 23.1 seconds — ABNORMAL HIGH (ref 11.4–15.2)

## 2021-03-01 MED ORDER — WARFARIN SODIUM 5 MG PO TABS
5.0000 mg | ORAL_TABLET | Freq: Once | ORAL | Status: AC
  Administered 2021-03-01: 5 mg via ORAL
  Filled 2021-03-01: qty 1

## 2021-03-01 NOTE — Progress Notes (Signed)
Consult follow-up  [name] is an 60 y.o. female with h/o atrial flutter/AVR on Coumadin; chronic diastolic CHF; stage 3 CKD; HTN; HLD; OHS on home O2; and morbid obesity who was admitted on 9/7 for wound infection following iliac crest bone harvesting on 7/29.   Patient underwent several surgeries as detailed below and was nearing discharge but renal function worsened in addition to mental status she was also transfused  Surgeries during this hospitalization: 9/7 - Wound debridement, antibiotic beads, wound vac 9/14 - Repeat debridement, wound vac 9/16 - Repeat debridement, wound vac 9/21 - repeat debridement, antibiotic beads, wound vac 9/23 - Repeat debridement, wound vac  S Awake-ate some food earlier today No distress Passing good urine Coherent verbalizing well--knows time place person  On exam BP 122/68 (BP Location: Right Arm)   Pulse (!) 59   Temp 97.9 F (36.6 C) (Oral)   Resp 17   Ht 5' 6.5" (1.689 m)   Wt 128 kg   SpO2 97%   BMI 44.86 kg/m  BUN/creatinine 46/4.8-->49/5.0-->49/5.17--49/5.09, CO2 23  INR 2.1 CBG 70s to 90s  EOMI NCAT coherent ROM intact cooperative  S1-S2 no murmur, mild JVD CTA B no rales rhonchi  Patient has wound VAC in place left lower quadrant/left hip Mild LE edema Sacrum not examined     Toxic metabolic encephalopathy likely secondary to polypharmacy Mentation improved with de-escalation of polypharmacy Gradual reintroduction of meds for pain if there is a need AKI/ATN with component of postobstructive uropathy Metabolic acidosis additionally FeNa is 4.7 indicative of postobstructive uropathy Foley is in place after 512 cc of urine being found on bladder scan D/w Dr. Joylene Grapes 10/1 -another consideration is ciprofloxacin causing crystallization + AKI-- change medication to alternative and see if this makes a difference Renal ultrasound 10/1 is negative for any specific findings Lasix Avapro held Continue bicarb 150 in water  @ 50 cc/h--acidosis resolving Labs with creatinine still in 5 range--will ask for formal Renal consult in am if no better Pelvic osteomyelitis defer wound care etc. to orthopedics Antibiotics will be adjusted Levaquin 500 q 48 HTN, chronic diastolic HFpEF Avapro 811 held, Lasix 40 a.m., 20 p.m. held continue metoprolol 50 daily Start amlodipine if pressure remains uncontrolled No component of acute heart failure at this time Atrial fibrillation on Coumadin CHADS2 score >4 Rate control with Coumadin therapeutic currently Depression hold bupropion 300 XL as well as Celexa 20 daily for now Can resume in 24 hours if creatinine improves and mentation stays better OHSS OSA on home O2 See above discussion Mandatory cpap at sleep time  D/w daughter [name] [phone] at bedside 10/1--will update based on lab trends in am Inpatient pending recovery Will follow and assist , thanks for consult

## 2021-03-01 NOTE — Progress Notes (Signed)
ANTICOAGULATION CONSULT NOTE - Follow Up Consult  Pharmacy Consult:  Coumadin Indication: Afib and St. Jude MVR/AVR  No Known Allergies  Patient Measurements: Height: 5' 6.5" (168.9 cm) Weight: 128 kg (282 lb 3 oz) IBW/kg (Calculated) : 60.45  Vital Signs: Temp: 98.1 F (36.7 C) (10/02 0350) Temp Source: Oral (10/02 0350) BP: 114/68 (10/02 0350) Pulse Rate: 74 (10/02 0350)  Labs: Recent Labs    02/27/21 0402 02/28/21 0249 03/01/21 0306  HGB 9.0*  --  8.5*  HCT 29.8*  --  27.6*  PLT 169  --  134*  LABPROT 27.2* 28.2* 23.1*  INR 2.5* 2.6* 2.1*  CREATININE 5.05* 5.17* 5.09*     Estimated Creatinine Clearance: 16 mL/min (A) (by C-G formula based on SCr of 5.09 mg/dL (H)).   Medical History:   Assessment: 60 yo F with left hip wound infection s/p multiple I&Ds and wound vac application.  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 Pharmacy consulted to resume Coumadin on 9/24.   INR 2.1 is subtherapeutic. Of note, patient started levaquin on 10/1 which in combination with warfarin may increase INR. Other DDI include allopurinol and citalopram but these a prior to admission medications. Hgb 8.5 Plt WNL. No bleeding noted.   PTA Warfarin Dose: 3.75 MG daily except 7.5 mg MWF  Goal of Therapy:  INR 2.5-3.5 Monitor platelets by anticoagulation protocol: Yes   Plan:  Warfarin 5 mg PO x1 dose today  Daily PT / INR Monitor for bleeding   Lestine Box, PharmD PGY2 Infectious Diseases Pharmacy Resident   Please check [email or web address] for unit-specific pharmacy phone numbers

## 2021-03-01 NOTE — Plan of Care (Signed)
  Problem: Education: Goal: Knowledge of General Education information will improve Description: Including pain rating scale, medication(s)/side effects and non-pharmacologic comfort measures Outcome: Progressing   Problem: Health Behavior/Discharge Planning: Goal: Ability to manage health-related needs will improve Outcome: Progressing   Problem: Clinical Measurements: Goal: Ability to maintain clinical measurements within normal limits will improve Outcome: Progressing Goal: Will remain free from infection Outcome: Progressing Goal: Diagnostic test results will improve Outcome: Progressing Goal: Respiratory complications will improve Outcome: Progressing Goal: Cardiovascular complication will be avoided Outcome: Progressing   Problem: Activity: Goal: Risk for activity intolerance will decrease Outcome: Progressing   Problem: Nutrition: Goal: Adequate nutrition will be maintained Outcome: Completed/Met   Problem: Coping: Goal: Level of anxiety will decrease Outcome: Completed/Met   Problem: Elimination: Goal: Will not experience complications related to bowel motility Outcome: Progressing Goal: Will not experience complications related to urinary retention Outcome: Progressing

## 2021-03-02 DIAGNOSIS — M86152 Other acute osteomyelitis, left femur: Secondary | ICD-10-CM | POA: Diagnosis not present

## 2021-03-02 LAB — RENAL FUNCTION PANEL
Albumin: 2.4 g/dL — ABNORMAL LOW (ref 3.5–5.0)
Anion gap: 8 (ref 5–15)
BUN: 47 mg/dL — ABNORMAL HIGH (ref 8–23)
CO2: 26 mmol/L (ref 22–32)
Calcium: 9.9 mg/dL (ref 8.9–10.3)
Chloride: 105 mmol/L (ref 98–111)
Creatinine, Ser: 5.05 mg/dL — ABNORMAL HIGH (ref 0.44–1.00)
GFR, Estimated: 9 mL/min — ABNORMAL LOW (ref >60)
Glucose, Bld: 88 mg/dL (ref 70–99)
Phosphorus: 3.1 mg/dL (ref 2.5–4.6)
Potassium: 3.5 mmol/L (ref 3.5–5.1)
Sodium: 139 mmol/L (ref 135–145)

## 2021-03-02 LAB — CBC WITH DIFFERENTIAL/PLATELET
Abs Immature Granulocytes: 0.03 10*3/uL (ref 0.00–0.07)
Basophils Absolute: 0.1 10*3/uL (ref 0.0–0.1)
Basophils Relative: 1 %
Eosinophils Absolute: 0.5 10*3/uL (ref 0.0–0.5)
Eosinophils Relative: 7 %
HCT: 26.5 % — ABNORMAL LOW (ref 36.0–46.0)
Hemoglobin: 8.3 g/dL — ABNORMAL LOW (ref 12.0–15.0)
Immature Granulocytes: 0 %
Lymphocytes Relative: 11 %
Lymphs Abs: 0.8 10*3/uL (ref 0.7–4.0)
MCH: 29.9 pg (ref 26.0–34.0)
MCHC: 31.3 g/dL (ref 30.0–36.0)
MCV: 95.3 fL (ref 80.0–100.0)
Monocytes Absolute: 0.7 10*3/uL (ref 0.1–1.0)
Monocytes Relative: 11 %
Neutro Abs: 5 10*3/uL (ref 1.7–7.7)
Neutrophils Relative %: 70 %
Platelets: 127 10*3/uL — ABNORMAL LOW (ref 150–400)
RBC: 2.78 MIL/uL — ABNORMAL LOW (ref 3.87–5.11)
RDW: 17.1 % — ABNORMAL HIGH (ref 11.5–15.5)
WBC: 7.1 10*3/uL (ref 4.0–10.5)
nRBC: 0 % (ref 0.0–0.2)

## 2021-03-02 LAB — PROTIME-INR
INR: 2.3 — ABNORMAL HIGH (ref 0.8–1.2)
Prothrombin Time: 25 seconds — ABNORMAL HIGH (ref 11.4–15.2)

## 2021-03-02 MED ORDER — SODIUM BICARBONATE 650 MG PO TABS
1300.0000 mg | ORAL_TABLET | Freq: Two times a day (BID) | ORAL | Status: DC
  Administered 2021-03-02 (×2): 1300 mg via ORAL
  Filled 2021-03-02 (×2): qty 2

## 2021-03-02 MED ORDER — WARFARIN SODIUM 5 MG PO TABS
5.0000 mg | ORAL_TABLET | Freq: Once | ORAL | Status: AC
  Administered 2021-03-02: 5 mg via ORAL
  Filled 2021-03-02: qty 1

## 2021-03-02 NOTE — Progress Notes (Addendum)
Full note to follow  Have formally asked Nephrology to assist with work-up/prognostication of AKI   Verneita Griffes, MD [facility] 7:55 AM
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
## 2021-03-02 NOTE — Progress Notes (Signed)
Physical Therapy Treatment Patient Details Name: [name] MRN: [id number] DOB: [birth year] Today's Date: 03/02/2021   History of Present Illness 60 y.o. 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 Due to the pt's Coumadin she has had continual increased drainage from the wound and currently has a large amount of purulent drainage. Repeat debridement L hip ulcer 9/14, 9/16, 9/21; planned 9/23. 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    PT Comments    Continuing work on functional mobility and activity tolerance;  Pt is so much better able to participate, interact, and discuss dc planning today; Walked to the bathroom and back with min/minguard assist (lots of cueing for RW proximity); Notably fatigued after relatively short walk;   Will update dc plan to home with family; REcommend 24 hour supervision/assist for pt  Recommendations for follow up therapy are one component of a multi-disciplinary discharge planning process, led by the attending physician.  Recommendations may be updated based on patient status, additional functional criteria and insurance authorization.  Follow Up Recommendations  Home health PT;Supervision/Assistance - 24 hour;Other (comment) (HHAide, HHRN)     Equipment Recommendations  Rolling walker with 5" wheels;3in1 (PT)    Recommendations for Other Services       Precautions / Restrictions Precautions Precautions: Fall Precaution Comments: wound vac Restrictions LLE Weight Bearing: Weight bearing as tolerated     Mobility  Bed Mobility Overal bed mobility: Needs Assistance Bed Mobility: Supine to Sit     Supine to sit: Min guard     General bed mobility comments: Minguard for safety and lines    Transfers Overall transfer level: Needs assistance Equipment used: Rolling walker (2 wheeled) Transfers: Sit to/from Stand Sit to Stand: Min  assist;+2 safety/equipment         General transfer comment: Multiple cues for hand placement and control; notably good control of descent to sit to commode in bathroom  Ambulation/Gait Ambulation/Gait assistance: Min guard;+2 safety/equipment Gait Distance (Feet): 20 Feet (to and from bathroom) Assistive device: Rolling walker (2 wheeled) Gait Pattern/deviations: Step-through pattern;Decreased step length - right;Decreased step length - left;Decreased stride length;Trunk flexed     General Gait Details: Cues to self-monitor of activity tolerance; and near constant cueing for RW proximity; Pt notably dizzy initially; able to walk into the bathroom, use the commode, then to sink, then to recliner chair beside bed; notably fatigued after today's short walk   Stairs             Wheelchair Mobility    Modified Rankin (Stroke Patients Only)       Balance     Sitting balance-Leahy Scale: Good       Standing balance-Leahy Scale: Poor (approaching Fair)                              Cognition Arousal/Alertness: Awake/alert Behavior During Therapy: WFL for tasks assessed/performed Overall Cognitive Status: Within Functional Limits for tasks assessed (for simple mobility tasks)                                 General Comments: A&O x4; Participating very well, aware of deficits      Exercises      General Comments General comments (skin integrity, edema, etc.): Excellent improvements; Pt able to participate in discussion related to  functional level where she needs to be in order to dc home      Pertinent Vitals/Pain Pain Assessment: Faces Faces Pain Scale: Hurts a little bit Pain Location: L hip, wound area Pain Descriptors / Indicators: Sore Pain Intervention(s): Monitored during session    Home Living                      Prior Function            PT Goals (current goals can now be found in the care plan section) Acute  Rehab PT Goals Patient Stated Goal: Hopes to go home PT Goal Formulation: With patient Time For Goal Achievement: 03/05/21 Potential to Achieve Goals: Fair Progress towards PT goals: Progressing toward goals    Frequency    Min 4X/week      PT Plan Discharge plan needs to be updated;Frequency needs to be updated    Co-evaluation PT/OT/SLP Co-Evaluation/Treatment: Yes Reason for Co-Treatment: For patient/therapist safety;To address functional/ADL transfers PT goals addressed during session: Mobility/safety with mobility        AM-PAC PT "6 Clicks" Mobility   Outcome Measure  Help needed turning from your back to your side while in a flat bed without using bedrails?: A Little Help needed moving from lying on your back to sitting on the side of a flat bed without using bedrails?: A Little Help needed moving to and from a bed to a chair (including a wheelchair)?: A Little Help needed standing up from a chair using your arms (e.g., wheelchair or bedside chair)?: A Little Help needed to walk in hospital room?: A Little Help needed climbing 3-5 steps with a railing? : A Lot 6 Click Score: 17    End of Session Equipment Utilized During Treatment: Gait belt Activity Tolerance: Patient tolerated treatment well;Other (comment) (limited by cognition/ability to follow cues) Patient left: in chair;with call bell/phone within reach;with chair alarm set Nurse Communication: Mobility status PT Visit Diagnosis: Difficulty in walking, not elsewhere classified (R26.2);Muscle weakness (generalized) (M62.81);Unsteadiness on feet (R26.81)     Time: 7858-8502 (end time is approximate) PT Time Calculation (min) (ACUTE ONLY): 35 min  Charges:  $Gait Training: 8-22 mins                     Roney Marion, [place]  Acute Rehabilitation Services Pager [phone] Office [phone]    Colletta Maryland 03/02/2021, 12:58 PM

## 2021-03-02 NOTE — Progress Notes (Signed)
Patient ID: [name], female   DOB: [birth year], 60 y.o.   MRN: [id number] Patient's renal function is showing slow improvement her creatinine is down to 5.05.  Hemoglobin stable at 8.3 and INR stable at 2.3.  There is 200 cc in the wound VAC new canister.  Continue with therapy.  Patient and family are planning for return to home.

## 2021-03-02 NOTE — Progress Notes (Signed)
Consult follow-up  [name] is an 60 y.o. female with h/o atrial flutter/AVR on Coumadin; chronic diastolic CHF; stage 3 CKD; HTN; HLD; OHS on home O2; and morbid obesity who was admitted on 9/7 for wound infection following iliac crest bone harvesting on 7/29.   Patient underwent several surgeries as detailed below and was nearing discharge but renal function worsened in addition to mental status she was also transfused  Renal was formally consulted on 10/3 for opinion as creatine Improvement stalled  Surgeries during this hospitalization: 9/7 - Wound debridement, antibiotic beads, wound vac 9/14 - Repeat debridement, wound vac 9/16 - Repeat debridement, wound vac 9/21 - repeat debridement, antibiotic beads, wound vac 9/23 - Repeat debridement, wound vac  S  Sitting oob in chair--looks completely different -more cogent and like a different person  On exam BP 123/67   Pulse (!) 51   Temp 98 F (36.7 C) (Oral)   Resp 14   Ht 5' 6.5" (1.689 m)   Wt 128 kg   SpO2 97%   BMI 44.86 kg/m  BUN/creatinine 46/4.8-->49/5.0-->49/5.17--49/5.09, CO2 23  INR 2.1 CBG 70s to 90s  EOMI NCAT coherent nio ict no pallor ROM intact cooperative  S1-S2 no murmur, RRR CTA B no rales rhonchi  Patient has wound VAC in place left lower quadrant/left hip Trace LE edema     Toxic metabolic encephalopathy likely secondary to polypharmacy Mentation improved with de-escalation of polypharmacy Gradual reintroduction of meds for pain if there is a need AKI/ATN with component of postobstructive uropathy Metabolic acidosis additionally FeNa is 4.7 indicative of postobstructive uropathy Foley is in place after 512 cc of urine being found on bladder scan Renal ultrasound 10/1 is negative for any specific findings Lasix Avapro held Continue bicarb 150 in water @ 50 cc/h--acidosis resolving Labs with creatinine still in 5 range Nephrology formally consulted for an opinion given slow improvement  of kidney function and no obvious source She has been seen by Dr. Posey Pronto of [facility] in the past Pelvic osteomyelitis defer wound care etc. to orthopedics Antibiotics adjusted Levaquin 500 q 48 HTN, chronic diastolic HFpEF Avapro 416 held, Lasix 40 a.m., 20 p.m. held continue metoprolol 50 daily Start amlodipine if pressure remains uncontrolled No component of acute heart failure at this time Atrial fibrillation on Coumadin CHADS2 score >4 Rate control with Coumadin therapeutic currently Depression hold bupropion 300 XL as well as Celexa 20 daily for now Resume likely as OP OHSS OSA on home O2 See above discussion Mandatory cpap at sleep time  D/w daughter [name] [phone] at bedside 10/1-will update once Renal sees and we have an opinion Inpatient pending recovery Will follow and assist , thanks for consult

## 2021-03-02 NOTE — Progress Notes (Signed)
Occupational Therapy Treatment Patient Details Name: [name] MRN: [id number] DOB: [birth year] Today's Date: 03/02/2021   History of present illness 60 y.o. 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 Due to the pt's Coumadin she has had continual increased drainage from the wound and currently has a large amount of purulent drainage. Repeat debridement L hip ulcer 9/14, 9/16, 9/21; planned 9/23. 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   OT comments  Patient supine in bed and agreeable to OT session. Patient requires min assist for transfers and mobility in room using RW, cueing for safety, problem solving and RW mgmt.  Total assist for toileting hygiene post +BM.  She continues to present with decreased recall, awareness and attention.  Discussed recommendation for 24/7 support at dc.  Based on performance today, with 24/7 support, believe pt could dc home with [facility] services.  Will follow.    Recommendations for follow up therapy are one component of a multi-disciplinary discharge planning process, led by the attending physician.  Recommendations may be updated based on patient status, additional functional criteria and insurance authorization.    Follow Up Recommendations  Home health OT;Supervision/Assistance - 24 hour    Equipment Recommendations  3 in 1 bedside commode    Recommendations for Other Services      Precautions / Restrictions Precautions Precautions: Fall Precaution Comments: wound vac Restrictions Weight Bearing Restrictions: Yes LLE Weight Bearing: Weight bearing as tolerated       Mobility Bed Mobility Overal bed mobility: Needs Assistance Bed Mobility: Supine to Sit     Supine to sit: Min guard     General bed mobility comments: Minguard for safety and lines    Transfers Overall transfer level: Needs assistance Equipment used: Rolling walker  (2 wheeled) Transfers: Sit to/from Stand Sit to Stand: Min assist;+2 safety/equipment         General transfer comment: Multiple cues for hand placement and control; RW mgmt    Balance Overall balance assessment: Needs assistance Sitting-balance support: Feet supported Sitting balance-Leahy Scale: Good     Standing balance support: Bilateral upper extremity supported;No upper extremity supported;During functional activity Standing balance-Leahy Scale: Poor (approaching Fair) Standing balance comment: relies on RW                           ADL either performed or assessed with clinical judgement   ADL Overall ADL's : Needs assistance/impaired     Grooming: Min guard;Wash/dry hands;Standing Grooming Details (indicate cue type and reason): standing at sink                 Toilet Transfer: Minimal assistance;+2 for safety/equipment;Ambulation;RW;Grab bars Toilet Transfer Details (indicate cue type and reason): to commode Toileting- Clothing Manipulation and Hygiene: Total assistance;Sit to/from stand Toileting - Clothing Manipulation Details (indicate cue type and reason): for hygiene after +BM     Functional mobility during ADLs: Minimal assistance;+2 for safety/equipment;Rolling walker       Vision       Perception     Praxis      Cognition Arousal/Alertness: Awake/alert Behavior During Therapy: WFL for tasks assessed/performed Overall Cognitive Status: Impaired/Different from baseline Area of Impairment: Attention;Awareness;Problem solving;Safety/judgement;Memory;Following commands                   Current Attention Level: Sustained Memory: Decreased recall of precautions;Decreased short-term memory Following Commands: Follows one step  commands consistently;Follows one step commands with increased time;Follows multi-step commands inconsistently Safety/Judgement: Decreased awareness of safety;Decreased awareness of deficits Awareness:  Emergent Problem Solving: Slow processing;Requires verbal cues General Comments: A&O x4; Participating well but requires cueing for problem sovling and safety, hyperfocused on lines and requires mulitple cues to prevent her from grabbing them, improved awareness but difficutly following mulitple step commands        Exercises     Shoulder Instructions       General Comments Excellent improvements; Pt able to participate in discussion related to functional level where she needs to be in order to dc home    Pertinent Vitals/ Pain       Pain Assessment: Faces Faces Pain Scale: Hurts a little bit Pain Location: L hip, wound area Pain Descriptors / Indicators: Sore Pain Intervention(s): Monitored during session  Home Living                                          Prior Functioning/Environment              Frequency  Min 2X/week        Progress Toward Goals  OT Goals(current goals can now be found in the care plan section)  Progress towards OT goals: Progressing toward goals  Acute Rehab OT Goals Patient Stated Goal: Hopes to go home OT Goal Formulation: With patient  Plan Discharge plan needs to be updated;Frequency remains appropriate    Co-evaluation    PT/OT/SLP Co-Evaluation/Treatment: Yes Reason for Co-Treatment: For patient/therapist safety;To address functional/ADL transfers PT goals addressed during session: Mobility/safety with mobility OT goals addressed during session: ADL's and self-care      AM-PAC OT "6 Clicks" Daily Activity     Outcome Measure   Help from another person eating meals?: None Help from another person taking care of personal grooming?: A Little Help from another person toileting, which includes using toliet, bedpan, or urinal?: A Little Help from another person bathing (including washing, rinsing, drying)?: A Little Help from another person to put on and taking off regular upper body clothing?: A Little Help  from another person to put on and taking off regular lower body clothing?: A Little 6 Click Score: 19    End of Session Equipment Utilized During Treatment: Rolling walker;Oxygen  OT Visit Diagnosis: Unsteadiness on feet (R26.81)   Activity Tolerance Patient tolerated treatment well   Patient Left in chair;with call bell/phone within reach;with chair alarm set   Nurse Communication Mobility status        Time: 1140-1210 OT Time Calculation (min): 30 min  Charges: OT General Charges $OT Visit: 1 Visit OT Treatments $Self Care/Home Management : 8-22 mins  Jolaine Artist, OT Acute Rehabilitation Services Pager [phone] Office [place] 03/02/2021, 1:31 PM

## 2021-03-02 NOTE — TOC Initial Note (Signed)
Transition of Care [facility]) - Initial/Assessment Note    Patient Details  Name: [name] MRN: [id number] Date of Birth: 1960/06/09  Transition of Care [facility]) CM/SW Contact:    Marilu Favre, RN Phone Number: 03/02/2021, 1:49 PM  Clinical Narrative:                 Patient from home with sister. PT updated recommendation to HHPT, 3 in 1 and walker .    NCM confirmed face sheet information with patient. Patient lives with sister. Patient states she will have "almost" 24/7 supervision.   Patient has a walker already and oxygen with [employer]. Patient has a portable tank for discharge.    Ordered 3 in 1.    Provided medicare.gov list of home health agencies. NCM will follow up later for choice.   Expected Discharge Plan: [place] Barriers to Discharge: Continued Medical Work up   Patient Goals and CMS Choice Patient states their goals for this hospitalization and ongoing recovery are:: to go home CMS Medicare.gov Compare Post Acute Care list provided to:: Patient Choice offered to / list presented to : Patient  Expected Discharge Plan and Services Expected Discharge Plan: [place]   Discharge Planning Services: CM Consult Post Acute Care Choice: Home Health, Durable Medical Equipment Living arrangements for the past 2 months: Apartment                 DME Arranged: 3-N-1 DME Agency: [employer] Date DME Agency Contacted: 03/02/21 Time DME Agency Contacted: [phone] Representative spoke with at DME Agency: Freda Munro HH Arranged: PT, OT          Prior Living Arrangements/Services Living arrangements for the past 2 months: Apartment Lives with:: Siblings Patient language and need for interpreter reviewed:: Yes Do you feel safe going back to the place where you live?: Yes      Need for Family Participation in Patient Care: Yes (Comment) Care giver support system in place?: Yes (comment) Current home services: DME Criminal  Activity/Legal Involvement Pertinent to Current Situation/Hospitalization: No - Comment as needed  Activities of Daily Living Home Assistive Devices/Equipment: Oxygen, Eyeglasses ADL Screening (condition at time of admission) Patient's cognitive ability adequate to safely complete daily activities?: Yes Is the patient deaf or have difficulty hearing?: No Does the patient have difficulty seeing, even when wearing glasses/contacts?: No Does the patient have difficulty concentrating, remembering, or making decisions?: No Patient able to express need for assistance with ADLs?: Yes Does the patient have difficulty dressing or bathing?: No Independently performs ADLs?: Yes (appropriate for developmental age) Does the patient have difficulty walking or climbing stairs?: No Weakness of Legs: None Weakness of Arms/Hands: None  Permission Sought/Granted Permission sought to share information with : Family Supports, Chartered certified accountant granted to share information with : No     Permission granted to share info w AGENCY: SNF        Emotional Assessment Appearance:: Appears stated age Attitude/Demeanor/Rapport: Engaged Affect (typically observed): Accepting Orientation: : Oriented to Self, Oriented to Place, Oriented to  Time, Oriented to Situation Alcohol / Substance Use: Not Applicable Psych Involvement: No (comment)  Admission diagnosis:  Hardware complicating wound infection ([place]) [T84.7XXA] Patient Active Problem List   Diagnosis Date Noted   Hardware complicating wound infection ([place]) 02/04/2021   Acute osteomyelitis, pelvis, left (HCC)    Postoperative hematoma of musculoskeletal structure following musculoskeletal procedure 01/19/2021   Hematoma of abdominal wall 01/03/2021   Hypovolemic  shock ([place]) 12/30/2020   History of bariatric surgery 04/18/2018   Abdominal pain 02/02/2018   Hyperkalemia 02/02/2018   Acute kidney injury superimposed on CKD ([place])  02/02/2018   Anemia    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 02/01/2018   Postoperative anemia due to chronic blood loss on full anticoagulation 02/01/2018   AKI (acute kidney injury) ([place]) 02/01/2018   Acute renal failure superimposed on stage 3 chronic kidney disease ([place]) 01/26/2018   PAF (paroxysmal atrial fibrillation) ([place])    Acute respiratory failure ([place])    Hypoxia    Postprocedural hypotension    Chronic acquired lymphedema - Right lower extremity 01/24/2018   Hx of mechanical aortic valve replacement 01/24/2018   Morbid obesity ([place]) 01/24/2018   Obstructive sleep apnea 02/03/2017   Chronic diastolic heart failure ([place]) 01/11/2017   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   HX: long term anticoagulant use 02/11/2016   Chronic respiratory failure ([place]) 08/12/2015   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   Allergic rhinitis 05/02/2015   Symptomatic anemia 04/08/2015   History of mitral valve replacement with mechanical valve 04/08/2015   CKD (chronic kidney disease) stage 3, GFR 30-59 ml/min (HCC) 04/08/2015   Atrial flutter (HCC)    Chronic anticoagulation 09/04/2014   Warfarin anticoagulation 08/14/2014   CHB (complete heart block) ([place]) 08/14/2014   First degree AV block 08/14/2014   Hypercarbia 08/07/2014   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 [facility] [place] [address] Phone: [phone] Fax:  [phone]     Social Determinants of Health (SDOH) Interventions    Readmission Risk Interventions No flowsheet data found.

## 2021-03-02 NOTE — Progress Notes (Signed)
ANTICOAGULATION CONSULT NOTE - Follow Up Consult  Pharmacy Consult:  Coumadin Indication: Afib and St. Jude MVR/AVR  No Known Allergies  Patient Measurements: Height: 5' 6.5" (168.9 cm) Weight: 128 kg (282 lb 3 oz) IBW/kg (Calculated) : 60.45  Vital Signs: Temp: 98 F (36.7 C) (10/03 0835) Temp Source: Oral (10/03 0835) BP: 123/67 (10/03 0835) Pulse Rate: 51 (10/03 0835)  Labs: Recent Labs    02/28/21 0249 03/01/21 0306 03/02/21 0328  HGB  --  8.5* 8.3*  HCT  --  27.6* 26.5*  PLT  --  134* 127*  LABPROT 28.2* 23.1* 25.0*  INR 2.6* 2.1* 2.3*  CREATININE 5.17* 5.09* 5.05*     Estimated Creatinine Clearance: 16.2 mL/min (A) (by C-G formula based on SCr of 5.05 mg/dL (H)).   Medical History:   Assessment: 60 yo F with left hip wound infection s/p multiple I&Ds and wound vac application.  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 Pharmacy consulted to resume Coumadin on 9/24.   INR 2.3 is subtherapeutic. Of note, patient started levaquin on 10/1 which in combination with warfarin may increase INR. Patient was previously on cirpoflox 9/28-10/1  Other DDI include allopurinol and citalopram but these a prior to admission medications. Hgb 8.3 Plt WNL. No bleeding noted.   PTA Warfarin Dose: 3.75 MG daily except 7.5 mg MWF  Goal of Therapy:  INR 2.5-3.5 Monitor platelets by anticoagulation protocol: Yes   Plan:  Will give warfarin 5mg  x1 today Anticipate the INR will increase 2/2 DDI with levofloxacin Current plan for 4 weeks of levofloxacin therapy Daily INR Monitor for signs/symptoms of bleed  Thank you for allowing pharmacy to be a part of this patient's care.  Donnald Garre, PharmD Clinical Pharmacist  Please check AMION for all [place] numbers After 10:00 PM, call [place] [phone]

## 2021-03-03 DIAGNOSIS — M86152 Other acute osteomyelitis, left femur: Secondary | ICD-10-CM | POA: Diagnosis not present

## 2021-03-03 LAB — RENAL FUNCTION PANEL
Albumin: 2.5 g/dL — ABNORMAL LOW (ref 3.5–5.0)
Anion gap: 8 (ref 5–15)
BUN: 44 mg/dL — ABNORMAL HIGH (ref 8–23)
CO2: 28 mmol/L (ref 22–32)
Calcium: 9.8 mg/dL (ref 8.9–10.3)
Chloride: 104 mmol/L (ref 98–111)
Creatinine, Ser: 4.92 mg/dL — ABNORMAL HIGH (ref 0.44–1.00)
GFR, Estimated: 9 mL/min — ABNORMAL LOW (ref >60)
Glucose, Bld: 125 mg/dL — ABNORMAL HIGH (ref 70–99)
Phosphorus: 3 mg/dL (ref 2.5–4.6)
Potassium: 3.4 mmol/L — ABNORMAL LOW (ref 3.5–5.1)
Sodium: 140 mmol/L (ref 135–145)

## 2021-03-03 LAB — PROTIME-INR
INR: 2.1 — ABNORMAL HIGH (ref 0.8–1.2)
Prothrombin Time: 23.3 seconds — ABNORMAL HIGH (ref 11.4–15.2)

## 2021-03-03 LAB — GENTAMICIN LEVEL, RANDOM: Gentamicin Rm: <0.5 ug/mL

## 2021-03-03 MED ORDER — WARFARIN SODIUM 7.5 MG PO TABS
7.5000 mg | ORAL_TABLET | Freq: Once | ORAL | Status: AC
  Administered 2021-03-03: 7.5 mg via ORAL
  Filled 2021-03-03: qty 1

## 2021-03-03 NOTE — Progress Notes (Signed)
Physical Therapy Treatment Patient Details Name: [name] MRN: [id number] DOB: [birth year] Today's Date: 03/03/2021   History of Present Illness 60 y.o. 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 Due to the pt's Coumadin she has had continual increased drainage from the wound and currently has a large amount of purulent drainage. Repeat debridement L hip ulcer 9/14, 9/16, 9/21; planned 9/23. 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    PT Comments    Continuing work on functional mobility and activity tolerance;  very nice improvements with endurance and RW use; on track for dc home -- possibly tomorrow   Recommendations for follow up therapy are one component of a multi-disciplinary discharge planning process, led by the attending physician.  Recommendations may be updated based on patient status, additional functional criteria and insurance authorization.  Follow Up Recommendations  Home health PT;Supervision/Assistance - 24 hour;Other (comment) (HHAide, HHRN)     Equipment Recommendations  3in1 (PT)    Recommendations for Other Services       Precautions / Restrictions Precautions Precautions: Fall Precaution Comments: wound vac Restrictions LLE Weight Bearing: Weight bearing as tolerated     Mobility  Bed Mobility Overal bed mobility: Needs Assistance Bed Mobility: Supine to Sit     Supine to sit: Min guard     General bed mobility comments: Minguard for safety and lines; go tup on R side of bed to approximate home    Transfers Overall transfer level: Needs assistance Equipment used: Rolling walker (2 wheeled) Transfers: Sit to/from Stand Sit to Stand: Min guard         General transfer comment: Cues for hand placement and safety; slow rise, but stable; good control of stand to sit  Ambulation/Gait Ambulation/Gait assistance: Min guard;+2  safety/equipment Gait Distance (Feet): 110 Feet (x2; with seated rest break) Assistive device: Rolling walker (2 wheeled) Gait Pattern/deviations: Step-through pattern;Decreased step length - right;Decreased step length - left;Decreased stride length;Trunk flexed     General Gait Details: Cues to self-monitor of activity tolerance; Better RW use and proximity; some lightheadeness initially, but it did not worsen, and pt was able to ambulate well in teh hallway   Stairs             Wheelchair Mobility    Modified Rankin (Stroke Patients Only)       Balance     Sitting balance-Leahy Scale: Good       Standing balance-Leahy Scale: Poor (approaching Fair) Standing balance comment: relies on RW                            Cognition Arousal/Alertness: Awake/alert Behavior During Therapy: WFL for tasks assessed/performed Overall Cognitive Status: Within Functional Limits for tasks assessed                                        Exercises      General Comments General comments (skin integrity, edema, etc.): Walked on 2 L supplemental O2      Pertinent Vitals/Pain Pain Assessment: No/denies pain Faces Pain Scale: No hurt Pain Intervention(s): Monitored during session    Home Living                      Prior Function  PT Goals (current goals can now be found in the care plan section) Acute Rehab PT Goals Patient Stated Goal: Hopes to go home; perhaps Wed, 10/4 PT Goal Formulation: With patient Time For Goal Achievement: 03/05/21 Potential to Achieve Goals: Good Progress towards PT goals: Progressing toward goals    Frequency    Min 4X/week      PT Plan Current plan remains appropriate    Co-evaluation              AM-PAC PT "6 Clicks" Mobility   Outcome Measure  Help needed turning from your back to your side while in a flat bed without using bedrails?: None Help needed moving from lying on  your back to sitting on the side of a flat bed without using bedrails?: None Help needed moving to and from a bed to a chair (including a wheelchair)?: A Little Help needed standing up from a chair using your arms (e.g., wheelchair or bedside chair)?: A Little Help needed to walk in hospital room?: A Little Help needed climbing 3-5 steps with a railing? : A Little 6 Click Score: 20    End of Session Equipment Utilized During Treatment: Gait belt Activity Tolerance: Patient tolerated treatment well Patient left: with call bell/phone within reach;Other (comment) (in bathroom, moving bowels) Nurse Communication: Mobility status PT Visit Diagnosis: Difficulty in walking, not elsewhere classified (R26.2);Muscle weakness (generalized) (M62.81);Unsteadiness on feet (R26.81)     Time: 8338-2505 PT Time Calculation (min) (ACUTE ONLY): 32 min  Charges:  $Gait Training: 23-37 mins                     [name], [place]  Acute Rehabilitation Services Pager [phone] Office [phone]    [name] 03/03/2021, 10:37 AM

## 2021-03-03 NOTE — Progress Notes (Deleted)
ANTICOAGULATION CONSULT NOTE - Follow Up Consult  Pharmacy Consult:  Coumadin Indication: Afib and St. Jude MVR/AVR  No Known Allergies  Patient Measurements: Height: 5' 6.5" (168.9 cm) Weight: 128 kg (282 lb 3 oz) IBW/kg (Calculated) : 60.45  Vital Signs: Temp: 98.3 F (36.8 C) (10/04 0358) Temp Source: Oral (10/04 0358) BP: 124/58 (10/04 0358) Pulse Rate: 62 (10/04 0358)  Labs: Recent Labs    03/01/21 0306 03/02/21 0328 03/03/21 0313  HGB 8.5* 8.3*  --   HCT 27.6* 26.5*  --   PLT 134* 127*  --   LABPROT 23.1* 25.0* 23.3*  INR 2.1* 2.3* 2.1*  CREATININE 5.09* 5.05* 4.92*     Estimated Creatinine Clearance: 16.6 mL/min (A) (by C-G formula based on SCr of 4.92 mg/dL (H)).   Medical History:   Assessment: 60 yo F with left hip wound infection s/p multiple I&Ds and wound vac application.  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 Pharmacy consulted to resume Coumadin on 9/24.   10/4 INR 2.1 (down from 2.3).   Of note, patient started levaquin on 10/1 which in combination with warfarin may increase INR. Patient was previously on cirpoflox 9/28-10/1  Other DDI include allopurinol and citalopram but these a prior to admission medications. No bleeding noted.   PTA Warfarin Dose: 3.75 MG daily except 7.5 mg MWF  Goal of Therapy:  INR 2.5-3.5 Monitor platelets by anticoagulation protocol: Yes   Plan:  Will give warfarin 7.5mg  x1 today Anticipate the INR will increase 2/2 DDI with levofloxacin Current plan for 4 weeks of levofloxacin therapy Daily INR Monitor for signs/symptoms of bleed  Thank you for allowing pharmacy to be a part of this patient's care.  Donnald Garre, PharmD Clinical Pharmacist  Please check AMION for all [place] numbers After 10:00 PM, call [place] [phone]

## 2021-03-03 NOTE — Progress Notes (Signed)
Patient ID: [name], female   DOB: [birth year], 60 y.o.   MRN: [id number] Patient's renal function improving slowly creatinine 4.92.  Only 50 cc in the wound VAC canister since yesterday.  Will plan for discharge on Wednesday if labs continue to improve.

## 2021-03-03 NOTE — TOC Progression Note (Signed)
Transition of Care [facility]) - Progression Note    Patient Details  Name: [name] MRN: [id number] Date of Birth: [birth year]  [facility] of [facility]) CM/SW Contact  Jacalyn Lefevre Edson Snowball, RN Phone Number: 03/03/2021, 10:41 AM  Clinical Narrative:     Followed up with patient regarding home health agency choice. Patient's first chioce is [facility]. NCM called Malachy Mood with [facility] accepted referral for HHRN,PT,OT,aide.   WIll need MD to sign orders and face to face , secured chatted MD.  Patient has home oxygen with [employer] and portable tank , also has walker at home. NCM ordered 3 in 1 , not in room NCM will call [employer] again for 3 in1 . Called Jasmine with [facility] , they will bring 3 in 1 to patient's hospital room today .  Per note possible discharge tomorrow pending lab results, patient aware.  Expected Discharge Plan: [place] Barriers to Discharge: Continued Medical Work up  Expected Discharge Plan and Services Expected Discharge Plan: [place]   Discharge Planning Services: CM Consult Post Acute Care Choice: Home Health, Durable Medical Equipment Living arrangements for the past 2 months: Apartment                 DME Arranged: 3-N-1 DME Agency: [employer] Date DME Agency Contacted: 03/02/21 Time DME Agency Contacted: [phone] Representative spoke with at DME Agency: Freda Munro HH Arranged: PT, OT           Social Determinants of Health ([place]) Interventions    Readmission Risk Interventions No flowsheet data found.

## 2021-03-03 NOTE — Progress Notes (Signed)
ANTICOAGULATION CONSULT NOTE - Follow Up Consult  Pharmacy Consult:  Coumadin Indication: Afib and St. Jude MVR/AVR  No Known Allergies  Patient Measurements: Height: 5' 6.5" (168.9 cm) Weight: 128 kg (282 lb 3 oz) IBW/kg (Calculated) : 60.45  Vital Signs: Temp: 98.3 F (36.8 C) (10/04 0358) Temp Source: Oral (10/04 0358) BP: 124/58 (10/04 0358) Pulse Rate: 62 (10/04 0358)  Labs: Recent Labs    03/01/21 0306 03/02/21 0328 03/03/21 0313  HGB 8.5* 8.3*  --   HCT 27.6* 26.5*  --   PLT 134* 127*  --   LABPROT 23.1* 25.0* 23.3*  INR 2.1* 2.3* 2.1*  CREATININE 5.09* 5.05* 4.92*     Estimated Creatinine Clearance: 16.6 mL/min (A) (by C-G formula based on SCr of 4.92 mg/dL (H)).   Medical History:   Assessment: 60 yo F with left hip wound infection s/p multiple I&Ds and wound vac application.  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 Pharmacy consulted to resume Coumadin on 9/24.   10/4 INR 2.1 (down from 2.3).   Of note, patient started levaquin on 10/1 which in combination with warfarin may increase INR. Patient was previously on cirpoflox 9/28-10/1  Other DDI include allopurinol and citalopram but these a prior to admission medications. No bleeding noted.   PTA Warfarin Dose: 3.75 MG daily except 7.5 mg MWF  Goal of Therapy:  INR 2.5-3.5 Monitor platelets by anticoagulation protocol: Yes   Plan:  Will give warfarin 7.5mg  x1 today Anticipate the INR will increase 2/2 DDI with levofloxacin Current plan for 4 weeks of levofloxacin therapy Daily INR Monitor for signs/symptoms of bleed  Dispo: Home with Home Health Wednesday  Thank you for allowing pharmacy to be a part of this patient's care.  Donnald Garre, PharmD Clinical Pharmacist  Please check AMION for all [place] numbers After 10:00 PM, call [place] [phone]

## 2021-03-03 NOTE — Progress Notes (Signed)
Consult follow-up If creatinine trends down further, we may just informally see/ sign off in the morning, please see bolded part of notes as below for specific recommendations I have also adjusted the discharge MAR in anticipation of discharge from orthopedic service tomorrow  [name] is an 60 y.o. female with h/o atrial flutter/AVR on Coumadin; chronic diastolic CHF; stage 3 CKD; HTN; HLD; OHS on home O2; and morbid obesity who was admitted on 9/7 for wound infection following iliac crest bone harvesting on 7/29.   Patient underwent several surgeries as detailed below and was nearing discharge but renal function worsened in addition to mental status she was also transfused  Renal was formally consulted on 10/3 for opinion  Creatinine has trended downward quite slowly but she is nonoliguric-work-up has revealed no specific 1 etiology, not AIN rare possibility of crystal nephropathy gentamicin level less than 0.5 (has antibiotic beads)  Surgeries during this hospitalization: 9/7 - Wound debridement, antibiotic beads, wound vac 9/14 - Repeat debridement, wound vac 9/16 - Repeat debridement, wound vac 9/21 - repeat debridement, antibiotic beads, wound vac 9/23 - Repeat debridement, wound vac  S  Looks well coherent no distress catheter is still in. She is mobile  On exam BP 123/67 (BP Location: Right Arm)   Pulse 71   Temp 98.6 F (37 C) (Oral)   Resp 18   Ht 5' 6.5" (1.689 m)   Wt 128 kg   SpO2 98%   BMI 44.86 kg/m  BUN/creatinine 46/4.8-->49/5.0-->49/5.17--49/5.09-->44/4.9 CO2 28 INR 2.1  CBG 70s to 90s  EOMI NCAT coherent nio ict no pallor ROM intact cooperative  S1-S2 no murmur, RRR CTA B no rales rhonchi  Patient has wound VAC in place left lower quadrant/left hip Unilateral edema R>L side   Toxic metabolic encephalopathy likely secondary to polypharmacy Mentation improved with de-escalation of polypharmacy AKI/ATN with component of postobstructive  uropathy Metabolic acidosis additionally FeNa is 4.7 indicative of postobstructive uropathy Foley voiding trial needed as retained earlier in hospital stay  Renal ultrasound 10/1 is negative for any specific findings Lasix Avapro held and would not resume on discharge Bicarb and fluids discontinued 10/3 by renal May /may not need Foley--if so we may need to place on AVS [facility] number for OP follow up [done--please remove if no longer needed] Needs outpatient labs in about 2 to 5 days post discharge to ensure labs are trending downwards Pelvic osteomyelitis defer wound care etc. to orthopedics Antibiotics adjusted Levaquin 500 q 48 HTN, chronic diastolic HFpEF Avapro 469 held, Lasix 40 a.m., 20 p.m. held-would not resume until well after discharge continue metoprolol 50 daily Start amlodipine if pressure remains uncontrolled No component of acute heart failure at this time Atrial fibrillation on Coumadin CHADS2 score >4 Rate controlled on metoprolol 50 daily Coumadin therapeutic currently Depression hold bupropion 300 XL as well as Celexa 20 daily for now Resume likely as OP-would not resume on discharge [facility] OSA on home O2 See above discussion Mandatory cpap at sleep time   D/w daughter Keirah Konitzer [phone] at bedside 10/4 Will follow and assist , thanks for consult

## 2021-03-03 NOTE — Progress Notes (Signed)
[facility] Progress Note   Subjective:   Feeling well, no new issues.  May d/c to home tomorrow with [facility] care. Has worked with PT.   Objective Vitals:   03/02/21 2126 03/02/21 2132 03/03/21 0358 03/03/21 0925  BP: 129/67 113/79 (!) 124/58 123/67  Pulse: 67 96 62 71  Resp:    18  Temp: 98.6 F (37 C) 97.7 F (36.5 C) 98.3 F (36.8 C) 98.6 F (37 C)  TempSrc: Oral Oral Oral Oral  SpO2: 96% 100% 99% 98%  Weight:      Height:       Physical Exam Gen: obese woman up in chair comfortably  Eyes: anicteric, +glasses SHF:[id number], MMM Neck: supple CV: RRR, no rub, II/VI SEM Abd: soft, obese Back: clear lungs GU: foley with amber urine Extr: RLE chronic lymphedema, LLE trace tibial edema, wound vac noted Neuro: grossly nonfocal Skin: no rashes noted  Additional Objective Labs: Basic Metabolic Panel: Recent Labs  Lab 03/01/21 0306 03/02/21 0328 03/03/21 0313  NA 138 139 140  K 3.6 3.5 3.4*  CL 107 105 104  CO2 23 26 28   GLUCOSE 86 88 125*  BUN 49* 47* 44*  CREATININE 5.09* 5.05* 4.92*  CALCIUM 10.2 9.9 9.8  PHOS 3.4 3.1 3.0   Liver Function Tests: Recent Labs  Lab 03/01/21 0306 03/02/21 0328 03/03/21 0313  ALBUMIN 2.4* 2.4* 2.5*   No results for input(s): LIPASE, AMYLASE in the last 168 hours. CBC: Recent Labs  Lab 02/27/21 0402 03/01/21 0306 03/02/21 0328  WBC 9.1 7.9 7.1  NEUTROABS  --   --  5.0  HGB 9.0* 8.5* 8.3*  HCT 29.8* 27.6* 26.5*  MCV 99.7 96.5 95.3  PLT 169 134* 127*   Blood Culture    Component Value Date/Time   SDES TISSUE LEFT HIP 02/13/2021 1432   SPECREQUEST ERECTUS MUSCLE 02/13/2021 1432   CULT  02/13/2021 1432    RARE SERRATIA MARCESCENS NO ANAEROBES ISOLATED CRITICAL RESULT CALLED TO, READ BACK BY AND VERIFIED WITH: DR. OYDX [phone] FCP Performed at [facility] [address], [place], [place] [address]    REPTSTATUS 02/22/2021 FINAL 02/13/2021 1432    Cardiac Enzymes: No results for  input(s): CKTOTAL, CKMB, CKMBINDEX, TROPONINI in the last 168 hours. CBG: No results for input(s): GLUCAP in the last 168 hours. Iron Studies: No results for input(s): IRON, TIBC, TRANSFERRIN, FERRITIN in the last 72 hours. @lablastinr3 @ Studies/Results: No results found. Medications:   sodium chloride   Intravenous Once   acidophilus  2 capsule Oral Daily   allopurinol  150 mg Oral QHS   vitamin C  1,000 mg Oral Daily   Chlorhexidine Gluconate Cloth  6 each Topical Daily   cholecalciferol  6,000 Units Oral Daily   docusate sodium  100 mg Oral BID   feeding supplement  237 mL Oral BID BM   levofloxacin  500 mg Oral Q48H   metoprolol tartrate  50 mg Oral Daily   montelukast  10 mg Oral Daily   pantoprazole  40 mg Oral Daily   pravastatin  80 mg Oral Daily   senna  1 tablet Oral Daily   sodium bicarbonate  1,300 mg Oral BID   sodium chloride flush  10-40 mL Intracatheter Q12H   warfarin  7.5 mg Oral ONCE-1600   Warfarin - Pharmacist Dosing Inpatient   Does not apply q1600   zinc sulfate  220 mg Oral Daily    Assessment/Plan **AKI, nonoliguric, severe on  baseline CKD 3a:  Gradual worsening of renal function since mid 01/2021 and in the past few days has plateaued in the 5 range.  Did have some modest hypotension in 9/27 which probably contributed some tubular injury.  Renal US ok. As of 9/29 UA not suggestive of AIN.  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  Random gent level < 0.5.  Cont to hold ARB.  Appears euvolemic-- can use PRN diuretics as clinically indicated - don't think any needed now (RLE lymphedema chronic).    Given the slow improvement, nonoliguric status likely ok for d/c with close clinic f/u.  Will follow labs in the AM too and make determination when to f/u.  D/c foley for TOV today - check bladder scans q4h today. Hopefully won't need foley  at d/c.    **Pelvic osteomyelitis:  s/p multiple debridements and abx bead placement.  Has wound vac currently.   Abx per above.   **Metabolic acidosis:  secondary to AKI, improved on supplemental bicarb.  Hold supplement with serum bicarb 28 today.    **HTN: BP in 120s off ARB for now, on metoprolol.    **Anemia:  Hb stable in the 8s.  Will check iron indices but in setting of severe infection wouldn't give IV iron yet.    **A fib: on coumadin, rate controlled.    Will follow, call with questions.     Jannifer Hick MD 03/03/2021, 9:38 AM  [facility] Pager: [phone]

## 2021-03-04 ENCOUNTER — Telehealth: Payer: Self-pay

## 2021-03-04 ENCOUNTER — Other Ambulatory Visit: Payer: Self-pay | Admitting: Orthopedic Surgery

## 2021-03-04 ENCOUNTER — Other Ambulatory Visit: Payer: Self-pay | Admitting: Family

## 2021-03-04 LAB — RENAL FUNCTION PANEL
Albumin: 2.5 g/dL — ABNORMAL LOW (ref 3.5–5.0)
Anion gap: 8 (ref 5–15)
BUN: 41 mg/dL — ABNORMAL HIGH (ref 8–23)
CO2: 28 mmol/L (ref 22–32)
Calcium: 9.7 mg/dL (ref 8.9–10.3)
Chloride: 103 mmol/L (ref 98–111)
Creatinine, Ser: 4.67 mg/dL — ABNORMAL HIGH (ref 0.44–1.00)
GFR, Estimated: 10 mL/min — ABNORMAL LOW (ref >60)
Glucose, Bld: 90 mg/dL (ref 70–99)
Phosphorus: 3.2 mg/dL (ref 2.5–4.6)
Potassium: 3.2 mmol/L — ABNORMAL LOW (ref 3.5–5.1)
Sodium: 139 mmol/L (ref 135–145)

## 2021-03-04 LAB — FERRITIN: Ferritin: 200 ng/mL (ref 11–307)

## 2021-03-04 LAB — IRON AND TIBC
Iron: 33 ug/dL (ref 28–170)
Saturation Ratios: 19 % (ref 10.4–31.8)
TIBC: 175 ug/dL — ABNORMAL LOW (ref 250–450)
UIBC: 142 ug/dL

## 2021-03-04 LAB — PROTIME-INR
INR: 2.5 — ABNORMAL HIGH (ref 0.8–1.2)
Prothrombin Time: 27.3 seconds — ABNORMAL HIGH (ref 11.4–15.2)

## 2021-03-04 MED ORDER — METOPROLOL TARTRATE 50 MG PO TABS: 50.0000 mg | ORAL_TABLET | Freq: Every day | ORAL | 0 refills | Status: DC

## 2021-03-04 MED ORDER — WARFARIN SODIUM 2.5 MG PO TABS: 3.7500 mg | ORAL_TABLET | ORAL | Status: DC

## 2021-03-04 MED ORDER — LEVOFLOXACIN 500 MG PO TABS: 500.0000 mg | ORAL_TABLET | ORAL | 0 refills | Status: DC

## 2021-03-04 MED ORDER — HYDROCODONE-ACETAMINOPHEN 5-325 MG PO TABS: 1.0000 | ORAL_TABLET | ORAL | 0 refills | Status: DC | PRN

## 2021-03-04 MED ORDER — WARFARIN SODIUM 7.5 MG PO TABS
7.5000 mg | ORAL_TABLET | ORAL | Status: DC
  Filled 2021-03-04: qty 1

## 2021-03-04 NOTE — Progress Notes (Signed)
OT Cancellation Note  Patient Details Name: [name] MRN: [id number] DOB: [birth year]   Cancelled Treatment:    Reason Eval/Treat Not Completed: Patient declined, no reason specified.  Patient refused OT treatment due to discharging home today.    [name] Alexis Goodell 03/04/2021, 2:22 PM

## 2021-03-04 NOTE — Progress Notes (Signed)
ANTICOAGULATION CONSULT NOTE - Follow Up Consult  Pharmacy Consult for Coumadin Indication: St Jude AVR/MVR and Afib   No Known Allergies  Patient Measurements: Height: 5' 6.5" (168.9 cm) Weight: 128 kg (282 lb 3 oz) IBW/kg (Calculated) : 60.45  Vital Signs: Temp: 97.6 F (36.4 C) (10/05 0814) Temp Source: Oral (10/05 0814) BP: 134/78 (10/05 0814) Pulse Rate: 58 (10/05 0814)  Labs: Recent Labs    03/02/21 0328 03/03/21 0313 03/04/21 0432  HGB 8.3*  --   --   HCT 26.5*  --   --   PLT 127*  --   --   LABPROT 25.0* 23.3* 27.3*  INR 2.3* 2.1* 2.5*  CREATININE 5.05* 4.92* 4.67*    Estimated Creatinine Clearance: 17.5 mL/min (A) (by C-G formula based on SCr of 4.67 mg/dL (H)).   Assessment: Anticoag: for St Jude AVR/MVR and Afib >> held for surgery > resumed w/ no bridge.  - INR up to 2.5 (goal 2.5-3.5). Note potential for FQ drug interaction. - Coumadin 3.75mg  daily exc 7.5 mg MWF PTA , (goal 2.5-3.5)  Goal of Therapy:  INR 2.5-3.5 Monitor platelets by anticoagulation protocol: Yes   Plan:  - Try resuming home Coumadin 3.75mg  daily exc 7.5 mg MWF  Daily INR   Zailee Vallely S. Alford Highland, PharmD, BCPS Clinical Staff Pharmacist [email or web address]  Alford Highland, [employer] 03/04/2021,9:56 AM

## 2021-03-04 NOTE — Discharge Summary (Signed)
Discharge Diagnoses:  Principal Problem:   Acute osteomyelitis, pelvis, left (HCC) Active Problems:   Essential hypertension   Hyperlipidemia   ILD (interstitial lung disease) (HCC)   Atrial flutter (HCC)   History of mitral valve replacement with mechanical valve   Chronic diastolic heart failure ([place])   Obstructive sleep apnea   Morbid obesity ([place])   Acute kidney injury superimposed on CKD [facility])   Surgeries: Procedure(s): REPEAT DEBRIDEMENT LEFT HIP on 02/20/2021    Consultants:   Discharged Condition: Improved  Hospital Course: [name] is an 60 y.o. female who was admitted 02/04/2021 with a chief complaint of left hip infection, with a final diagnosis of Abscess Left Hip.  Patient was brought to the operating room on 02/20/2021 and underwent Procedure(s): REPEAT DEBRIDEMENT LEFT HIP.    Patient was given perioperative antibiotics:  Anti-infectives (From admission, onward)    Start     Dose/Rate Route Frequency Ordered Stop   03/05/21 0000  levofloxacin (LEVAQUIN) 500 MG tablet        500 mg Oral Every 48 hours 03/04/21 1245     03/01/21 1000  levofloxacin (LEVAQUIN) tablet 500 mg        500 mg Oral Every 48 hours 02/28/21 1002     02/26/21 1015  ciprofloxacin (CIPRO) tablet 500 mg  Status:  Discontinued        500 mg Oral Daily 02/26/21 0926 02/28/21 1002   02/26/21 1000  cefdinir (OMNICEF) capsule 300 mg  Status:  Discontinued        300 mg Oral Every 12 hours 02/26/21 0814 02/26/21 0926   02/25/21 1030  ciprofloxacin (CIPRO) tablet 500 mg  Status:  Discontinued        500 mg Oral 2 times daily 02/25/21 0931 02/26/21 0814   02/24/21 1000  cefTRIAXone (ROCEPHIN) 2 g in sodium chloride 0.9 % 100 mL IVPB  Status:  Discontinued        2 g 200 mL/hr over 30 Minutes Intravenous Every 24 hours 02/24/21 0848 02/25/21 0931   02/18/21 1245  gentamicin (GARAMYCIN) injection  Status:  Discontinued          As needed 02/18/21 1254 02/18/21 1319   02/18/21 1243  vancomycin  (VANCOCIN) powder  Status:  Discontinued          As needed 02/18/21 1244 02/18/21 1319   02/15/21 1800  ceFEPIme (MAXIPIME) 2 g in sodium chloride 0.9 % 100 mL IVPB  Status:  Discontinued        2 g 200 mL/hr over 30 Minutes Intravenous Every 12 hours 02/15/21 0849 02/24/21 0848   02/13/21 1358  vancomycin (VANCOCIN) powder  Status:  Discontinued          As needed 02/13/21 1358 02/13/21 1521   02/13/21 1358  gentamicin (GARAMYCIN) injection  Status:  Discontinued          As needed 02/13/21 1359 02/13/21 1521   02/05/21 2200  vancomycin (VANCOREADY) IVPB 1250 mg/250 mL  Status:  Discontinued        1,250 mg 166.7 mL/hr over 90 Minutes Intravenous Every 24 hours 02/04/21 2037 02/09/21 1227   02/04/21 2200  ceFEPIme (MAXIPIME) 2 g in sodium chloride 0.9 % 100 mL IVPB  Status:  Discontinued        2 g 200 mL/hr over 30 Minutes Intravenous Every 8 hours 02/04/21 2037 02/15/21 0849   02/04/21 2200  vancomycin (VANCOREADY) IVPB 2000 mg/400 mL  2,000 mg 200 mL/hr over 120 Minutes Intravenous  Once 02/04/21 2037 02/05/21 0114   02/04/21 1444  vancomycin (VANCOCIN) powder  Status:  Discontinued          As needed 02/04/21 1445 02/04/21 1550   02/04/21 1443  gentamicin (GARAMYCIN) injection  Status:  Discontinued          As needed 02/04/21 1444 02/04/21 1550   02/04/21 1115  ceFAZolin (ANCEF) IVPB 3g/100 mL premix        3 g 200 mL/hr over 30 Minutes Intravenous On call to O.R. 02/04/21 1100 02/04/21 1527     .  Patient was given sequential compression devices, early ambulation, and aspirin for DVT prophylaxis.  Recent vital signs: Patient Vitals for the past 24 hrs:  BP Temp Temp src Pulse Resp SpO2  03/04/21 0814 134/78 97.6 F (36.4 C) Oral (!) 58 20 97 %  03/04/21 0615 134/72 97.7 F (36.5 C) Oral 60 18 99 %  03/03/21 2100 139/77 98.2 F (36.8 C) Oral 61 18 97 %  03/03/21 1735 132/64 97.9 F (36.6 C) Oral 60 18 96 %  .  Recent laboratory studies: No results  found.  Discharge Medications:   Allergies as of 03/04/2021   No Known Allergies      Medication List     STOP taking these medications    acetaminophen 650 MG CR tablet Commonly known as: TYLENOL   buPROPion 300 MG 24 hr tablet Commonly known as: WELLBUTRIN XL   citalopram 20 MG tablet Commonly known as: CELEXA   furosemide 40 MG tablet Commonly known as: LASIX   HYDROcodone-acetaminophen 7.5-325 MG tablet Commonly known as: Norco Replaced by: HYDROcodone-acetaminophen 5-325 MG tablet   irbesartan 150 MG tablet Commonly known as: Avapro   Klor-Con M20 20 MEQ tablet Generic drug: potassium chloride SA       TAKE these medications    allopurinol 300 MG tablet Commonly known as: ZYLOPRIM Take 300 mg by mouth at bedtime.   cetirizine 10 MG tablet Commonly known as: ZYRTEC Take 10 mg by mouth daily.   chlorhexidine 0.12 % solution Commonly known as: Peridex Use as directed 15 mLs in the mouth or throat 2 (two) times daily. What changed: when to take this   HYDROcodone-acetaminophen 5-325 MG tablet Commonly known as: NORCO/VICODIN Take 1 tablet by mouth every 4 (four) hours as needed. Replaces: HYDROcodone-acetaminophen 7.5-325 MG tablet   levofloxacin 500 MG tablet Commonly known as: LEVAQUIN Take 1 tablet (500 mg total) by mouth every other day. Start taking on: March 05, 2021   metoprolol tartrate 50 MG tablet Commonly known as: LOPRESSOR Take 1 tablet (50 mg total) by mouth daily. Start taking on: March 05, 2021   montelukast 10 MG tablet Commonly known as: SINGULAIR Take 10 mg by mouth daily.   OXYGEN Inhale 2.5 L into the lungs daily as needed (SOB).   pravastatin 80 MG tablet Commonly known as: PRAVACHOL Take 80 mg by mouth daily.   Vitamin D3 50 MCG (2000 UT) Tabs Take 6,000 Units by mouth daily.   warfarin 7.5 MG tablet Commonly known as: COUMADIN Take as directed. If you are unsure how to take this medication, talk to your  nurse or doctor. Original instructions: Take 7.5mg  (1 full tablet) Monday, Wednesday, Friday. Take 3.75mg  (half a tablet) Saturday, Tuesday, Thursday, Sunday. INR check once a week               Durable Medical Equipment  (From admission,  onward)           Start     Ordered   03/02/21 1352  For home use only DME 3 n 1  Once        03/02/21 1351              Discharge Care Instructions  (From admission, onward)           Start     Ordered   03/04/21 0000  Weight bearing as tolerated       Question Answer Comment  Laterality left   Extremity Lower      03/04/21 1245            Diagnostic Studies: CT ABDOMEN PELVIS WO CONTRAST  Result Date: 02/14/2021 CLINICAL DATA:  Abdominal abscess/infection suspected. Necrotic deep and superficial oblique muscles of the abdomen on the left. EXAM: CT ABDOMEN AND PELVIS WITHOUT CONTRAST TECHNIQUE: Multidetector CT imaging of the abdomen and pelvis was performed following the standard protocol without IV contrast. COMPARISON:  12/30/2020 FINDINGS: Lower chest: Mild atelectasis in the lung bases. Postoperative changes in the mediastinum with aortic and mitral valve prostheses. Small esophageal hiatal hernia. Hepatobiliary: No focal liver lesions. Increased density diffusely throughout the gallbladder which could represent milk of calcium, stones, or sludge. No inflammatory changes. No bile duct dilatation. Pancreas: Unremarkable. No pancreatic ductal dilatation or surrounding inflammatory changes. Spleen: Normal in size without focal abnormality. Adrenals/Urinary Tract: Adrenal glands are unremarkable. Kidneys are normal, without renal calculi, focal lesion, or hydronephrosis. Bladder is unremarkable. 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 Stomach, small bowel, and colon are mostly decompressed. No inflammatory changes identified. Appendix is normal. Vascular/Lymphatic: Scattered aortic  calcification.  No aneurysm. Reproductive: Uterus and ovaries are not enlarged. Other: No free air or free fluid in the abdomen. Collection and hematoma again demonstrated in the left flank musculature and overlying subcutaneous fat. The collection has decompressed since previous study, possibly with interval drainage. There appears to be a surface drain extending to a fistula in the subcutaneous tissues. The overall collection now measures about 6.3 x 15.8 cm. The collection is now spilled with a small amount of fluid and blood as well as gas. Multiple radiopaque densities are demonstrated within the collection, suggesting packing material or postoperative change. Small left inguinal hernia containing fat. Musculoskeletal: Degenerative changes in the spine. No destructive bone lesions. IMPRESSION: 1. Persistent collections in the left flank musculature and overlying subcutaneous fat with decrease in size and change in content as discussed above. This is likely result of interval drainage procedure. 2. Small esophageal hiatal hernia.  Aortic atherosclerosis. Electronically Signed   By: Lucienne Capers M.D.   On: 02/14/2021 22:15   US RENAL  Result Date: 02/28/2021 CLINICAL DATA:  Acute renal insufficiency EXAM: RENAL / URINARY TRACT ULTRASOUND COMPLETE COMPARISON:  None. FINDINGS: Right Kidney: Renal measurements: 11.3 x 4.7 x 5.4 cm = volume: 152 mL. Increased cortical echogenicity. Left Kidney: Renal measurements: 11.0 x 5.0 x 5.1 cm = volume: 145.2 mL. Increased cortical echogenicity. Bladder: Decompressed with a Foley catheter. Other: None. IMPRESSION: 1. Increased cortical echogenicity bilaterally is consistent with medical renal disease. 2. The bladder is not well assessed as it was decompressed with a Foley catheter. Electronically Signed   By: Dorise Bullion III M.D.   On: 02/28/2021 08:18    Patient benefited maximally from their hospital stay and there were no complications.     Disposition:  Discharge disposition: 01-Home or Self Care  Discharge Instructions     Call MD / Call 911   Complete by: As directed    If you experience chest pain or shortness of breath, CALL 911 and be transported to the hospital emergency room.  If you develope a fever above 101 F, pus (white drainage) or increased drainage or redness at the wound, or calf pain, call your surgeon's office.   Call MD / Call 911   Complete by: As directed    If you experience chest pain or shortness of breath, CALL 911 and be transported to the hospital emergency room.  If you develope a fever above 101 F, pus (white drainage) or increased drainage or redness at the wound, or calf pain, call your surgeon's office.   Constipation Prevention   Complete by: As directed    Drink plenty of fluids.  Prune juice may be helpful.  You may use a stool softener, such as Colace (over the counter) 100 mg twice a day.  Use MiraLax (over the counter) for constipation as needed.   Constipation Prevention   Complete by: As directed    Drink plenty of fluids.  Prune juice may be helpful.  You may use a stool softener, such as Colace (over the counter) 100 mg twice a day.  Use MiraLax (over the counter) for constipation as needed.   Diet - low sodium heart healthy   Complete by: As directed    Diet - low sodium heart healthy   Complete by: As directed    Increase activity slowly as tolerated   Complete by: As directed    Increase activity slowly as tolerated   Complete by: As directed    Negative Pressure Wound Therapy - Incisional   Complete by: As directed    Discharge with the wound VAC dressing attached to the Praveena plus portable pump.  Please give patient a total of 3 canisters for the pump.   Post-operative opioid taper instructions:   Complete by: As directed    POST-OPERATIVE OPIOID TAPER INSTRUCTIONS: It is important to wean off of your opioid medication as soon as possible. If you do not need pain medication after  your surgery it is ok to stop day one. Opioids include: Codeine, Hydrocodone(Norco, Vicodin), Oxycodone(Percocet, oxycontin) and hydromorphone amongst others.  Long term and even short term use of opiods can cause: Increased pain response Dependence Constipation Depression Respiratory depression And more.  Withdrawal symptoms can include Flu like symptoms Nausea, vomiting And more Techniques to manage these symptoms Hydrate well Eat regular healthy meals Stay active Use relaxation techniques(deep breathing, meditating, yoga) Do Not substitute Alcohol to help with tapering If you have been on opioids for less than two weeks and do not have pain than it is ok to stop all together.  Plan to wean off of opioids This plan should start within one week post op of your joint replacement. Maintain the same interval or time between taking each dose and first decrease the dose.  Cut the total daily intake of opioids by one tablet each day Next start to increase the time between doses. The last dose that should be eliminated is the evening dose.      Post-operative opioid taper instructions:   Complete by: As directed    POST-OPERATIVE OPIOID TAPER INSTRUCTIONS: It is important to wean off of your opioid medication as soon as possible. If you do not need pain medication after your surgery it is ok to stop day one. Opioids include: Codeine,  Hydrocodone(Norco, Vicodin), Oxycodone(Percocet, oxycontin) and hydromorphone amongst others.  Long term and even short term use of opiods can cause: Increased pain response Dependence Constipation Depression Respiratory depression And more.  Withdrawal symptoms can include Flu like symptoms Nausea, vomiting And more Techniques to manage these symptoms Hydrate well Eat regular healthy meals Stay active Use relaxation techniques(deep breathing, meditating, yoga) Do Not substitute Alcohol to help with tapering If you have been on opioids for  less than two weeks and do not have pain than it is ok to stop all together.  Plan to wean off of opioids This plan should start within one week post op of your joint replacement. Maintain the same interval or time between taking each dose and first decrease the dose.  Cut the total daily intake of opioids by one tablet each day Next start to increase the time between doses. The last dose that should be eliminated is the evening dose.      Weight bearing as tolerated   Complete by: As directed    Laterality: left   Extremity: Lower       Follow-up Information     [facility], [place] Follow up.   Contact information: [place] [address] [phone]         [facility]. Schedule an appointment as soon as possible for a visit in 1 week(s).   Why: As needed, If symptoms worsen Contact information: [place]        [name], MD Follow up in 1 week(s).   Specialty: Orthopedic Surgery Contact information: [address] [place] [address] [phone]                  Signed: [name] 03/04/2021, 12:48 PM

## 2021-03-04 NOTE — Progress Notes (Signed)
RN gave patient discharge instructions and the patient stated understanding. IV has been removed and the patient and her sister informed of everything. New medications escribed to home pharmacy. RN switched pt over from [facility] to [facility] was running normal at first then kept alarming that there was a leak. RN attempted to trouble shoot, placed more drape over area and nothing seemed to work. RN called charge nurse Otila Kluver, and she assessed and attempted to troubleshoot as well no success. RN called Dr. Sharol Given who was in [place] but was able to speak to him and notified him of the situation. He stated to remove the [place] and place 4x4 and tape to create a dressing. RN removed [facility] dressing 3 penrose drains in place, RN placed 4x4's and ABD with medipore tape on top. Pt tolerated well, no complaints and sent home with extra supplies.

## 2021-03-04 NOTE — Progress Notes (Signed)
Physical Therapy Treatment Patient Details Name: [name] MRN: [id number] DOB: [birth year] Today's Date: 03/04/2021   History of Present Illness 60 y.o. 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 Due to the pt's Coumadin she has had continual increased drainage from the wound and currently has a large amount of purulent drainage. Repeat debridement L hip ulcer 9/14, 9/16, 9/21; planned 9/23. 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    PT Comments    Continuing work on functional mobility and activity tolerance;  Session focused on progressive ambulation, and pt was able to walk in the hallway on room air with little difficulty; Questions solicited and answered; OK for dc home from PT standpoint  Recommendations for follow up therapy are one component of a multi-disciplinary discharge planning process, led by the attending physician.  Recommendations may be updated based on patient status, additional functional criteria and insurance authorization.  Follow Up Recommendations  Home health PT;Supervision/Assistance - 24 hour;Other (comment) (HHAide, HHRN)     Equipment Recommendations  3in1 (PT)    Recommendations for Other Services       Precautions / Restrictions Precautions Precautions: Fall Precaution Comments: wound vac; fall risk present, but minimized well with RW use Restrictions Weight Bearing Restrictions: Yes LLE Weight Bearing: Weight bearing as tolerated     Mobility  Bed Mobility   Bed Mobility: Supine to Sit     Supine to sit: Supervision     General bed mobility comments: Supervision for lines    Transfers Overall transfer level: Needs assistance Equipment used: Rolling walker (2 wheeled) Transfers: Sit to/from Stand Sit to Stand: Min guard (without physical contact)         General transfer comment: less need for cues for hand placement;  noted some dependence on momentum, but not needign physical assist to rise; good control of stand to sit  Ambulation/Gait Ambulation/Gait assistance: Min guard (without physical cantact) Gait Distance (Feet): 120 Feet Assistive device: Rolling walker (2 wheeled) Gait Pattern/deviations: Step-through pattern Gait velocity: decreased   General Gait Details: Better posutre and activity tolerance   Stairs             Wheelchair Mobility    Modified Rankin (Stroke Patients Only)       Balance     Sitting balance-Leahy Scale: Good       Standing balance-Leahy Scale: Fair                              Cognition Arousal/Alertness: Awake/alert Behavior During Therapy: WFL for tasks assessed/performed Overall Cognitive Status: Within Functional Limits for tasks assessed                                 General Comments: Much imporved, back to at or very near normal      Exercises      General Comments General comments (skin integrity, edema, etc.): Walked on room air without noted DOE; O2 sats 94% seated immendiately post walk      Pertinent Vitals/Pain Pain Assessment: No/denies pain Pain Intervention(s): Monitored during session    Home Living                      Prior Function            PT Goals (  current goals can now be found in the care plan section) Acute Rehab PT Goals Patient Stated Goal: Home today PT Goal Formulation: With patient Time For Goal Achievement: 03/05/21 Potential to Achieve Goals: Good Progress towards PT goals: Progressing toward goals    Frequency    Min 4X/week      PT Plan Current plan remains appropriate    Co-evaluation              AM-PAC PT "6 Clicks" Mobility   Outcome Measure  Help needed turning from your back to your side while in a flat bed without using bedrails?: None Help needed moving from lying on your back to sitting on the side of a flat bed without using  bedrails?: None Help needed moving to and from a bed to a chair (including a wheelchair)?: A Little Help needed standing up from a chair using your arms (e.g., wheelchair or bedside chair)?: A Little Help needed to walk in hospital room?: A Little Help needed climbing 3-5 steps with a railing? : A Little 6 Click Score: 20    End of Session Equipment Utilized During Treatment: Gait belt Activity Tolerance: Patient tolerated treatment well Patient left: in chair;with call bell/phone within reach;with family/visitor present Nurse Communication: Mobility status PT Visit Diagnosis: Difficulty in walking, not elsewhere classified (R26.2);Muscle weakness (generalized) (M62.81);Unsteadiness on feet (R26.81)     Time: 4503-8882 PT Time Calculation (min) (ACUTE ONLY): 32 min  Charges:  $Gait Training: 23-37 mins                     Roney Marion, [place] Pager [phone] Office [place] 03/04/2021, 1:18 PM

## 2021-03-04 NOTE — Care Management (Signed)
[name] with [facility] aware patient discharging today .

## 2021-03-04 NOTE — Telephone Encounter (Signed)
Triage call from [place], with [facility]: Dr. Sharol Given just wrote an Rx for Levaquin 500 mg for this patient -- there is a drug interaction with her Celexa. Please advise. CB # [id number]. Mia said it is ok to leave a full message on the voice mail, as they will be closed at 1:30 for lunch.

## 2021-03-04 NOTE — Progress Notes (Signed)
[facility] Progress Note   Subjective:   Feeling well, no new issues.  Good appetite, foley out with no voiding issues.  UOP recorded 230mL but had several unmeasured voids.  She feels that she's urinating a normal amount.  No edema.  Likely d/c today.   Objective Vitals:   03/03/21 1735 03/03/21 2100 03/04/21 0615 03/04/21 0814  BP: 132/64 139/77 134/72 134/78  Pulse: 60 61 60 (!) 58  Resp: 18 18 18 20   Temp: 97.9 F (36.6 C) 98.2 F (36.8 C) 97.7 F (36.5 C) 97.6 F (36.4 C)  TempSrc: Oral Oral Oral Oral  SpO2: 96% 97% 99% 97%  Weight:      Height:       Physical Exam Gen: obese woman up in chair comfortably  Eyes: anicteric, +glasses [id number], MMM Neck: supple CV: RRR, no rub, II/VI SEM Abd: soft, obese Back: clear lungs GU: no foley Extr: RLE chronic lymphedema, LLE trace tibial edema, wound vac noted Neuro: grossly nonfocal Skin: no rashes noted  Additional Objective Labs: Basic Metabolic Panel: Recent Labs  Lab 03/02/21 0328 03/03/21 0313 03/04/21 0432  NA 139 140 139  K 3.5 3.4* 3.2*  CL 105 104 103  CO2 26 28 28   GLUCOSE 88 125* 90  BUN 47* 44* 41*  CREATININE 5.05* 4.92* 4.67*  CALCIUM 9.9 9.8 9.7  PHOS 3.1 3.0 3.2    Liver Function Tests: Recent Labs  Lab 03/02/21 0328 03/03/21 0313 03/04/21 0432  ALBUMIN 2.4* 2.5* 2.5*    No results for input(s): LIPASE, AMYLASE in the last 168 hours. CBC: Recent Labs  Lab 02/27/21 0402 03/01/21 0306 03/02/21 0328  WBC 9.1 7.9 7.1  NEUTROABS  --   --  5.0  HGB 9.0* 8.5* 8.3*  HCT 29.8* 27.6* 26.5*  MCV 99.7 96.5 95.3  PLT 169 134* 127*    Blood Culture    Component Value Date/Time   SDES TISSUE LEFT HIP 02/13/2021 1432   SPECREQUEST ERECTUS MUSCLE 02/13/2021 1432   CULT  02/13/2021 1432    RARE SERRATIA MARCESCENS NO ANAEROBES ISOLATED CRITICAL RESULT CALLED TO, READ BACK BY AND VERIFIED WITH: DR. KGUR [phone] FCP Performed at [facility]  [address] [place], [place] [address]    REPTSTATUS 02/22/2021 FINAL 02/13/2021 1432    Cardiac Enzymes: No results for input(s): CKTOTAL, CKMB, CKMBINDEX, TROPONINI in the last 168 hours. CBG: No results for input(s): GLUCAP in the last 168 hours. Iron Studies:  Recent Labs    03/04/21 0432  IRON 33  TIBC 175*  FERRITIN 200   @lablastinr3 @ Studies/Results: No results found. Medications:   sodium chloride   Intravenous Once   acidophilus  2 capsule Oral Daily   allopurinol  150 mg Oral QHS   vitamin C  1,000 mg Oral Daily   Chlorhexidine Gluconate Cloth  6 each Topical Daily   cholecalciferol  6,000 Units Oral Daily   docusate sodium  100 mg Oral BID   feeding supplement  237 mL Oral BID BM   levofloxacin  500 mg Oral Q48H   metoprolol tartrate  50 mg Oral Daily   montelukast  10 mg Oral Daily   pantoprazole  40 mg Oral Daily   pravastatin  80 mg Oral Daily   senna  1 tablet Oral Daily   sodium chloride flush  10-40 mL Intracatheter Q12H   Warfarin - Pharmacist Dosing Inpatient   Does not apply q1600   zinc sulfate  220  mg Oral Daily    Assessment/Plan **AKI, nonoliguric, severe on baseline CKD 3a:  Baseline Cr 1-1.3. Gradual worsening of renal function since mid 01/2021 and rose to 5 late 01/2021 early 02/2021.  Did have some modest hypotension in 9/27 which probably contributed some tubular injury.  Renal US ok but did have some urinary retention noted requiring foley placement. As of 9/29 UA not suggestive of AIN.  Random gent level < 0.5.  Cont to hold ARB.  Appears euvolemic-- can use PRN diuretics as clinically indicated - don't think any needed now (RLE lymphedema chronic). TOV went well yesterday, no retention noted.  OK for d/c from my perspective - appears her recovery will be protracted but I see no reason to keep her here.  Please have [facility] check BMP in 1 week and send results to [facility] fax [phone].  She will have Office visit scheduled in 2 weeks.   **Pelvic  osteomyelitis:  s/p multiple debridements and abx bead placement.  Has wound vac currently.   Abx per primary.  No e/o AIN.   **Metabolic acidosis:  secondary to AKI, now resolved.   **HTN: BP in 120-130s off ARB for now, on metoprolol.  Cont to hold ARB at d/c until f/u.    **Anemia:  Hb stable in the 8s.  10/5 ferritin 200, iron 33, sat 19%.  CTM fo rnow.    **A fib: on coumadin, rate controlled.    [place] for d/c, see above.   Pt aware to reach out to [facility] re: office f/u if she doesn't hear by Friday.  She has the number and follows with Dr. Posey Pronto there.      Jannifer Hick MD 03/04/2021, 9:45 AM  [facility] Pager: [phone]

## 2021-03-05 DIAGNOSIS — I4892 Unspecified atrial flutter: Secondary | ICD-10-CM | POA: Diagnosis not present

## 2021-03-05 DIAGNOSIS — N183 Chronic kidney disease, stage 3 unspecified: Secondary | ICD-10-CM | POA: Diagnosis not present

## 2021-03-05 DIAGNOSIS — Z952 Presence of prosthetic heart valve: Secondary | ICD-10-CM | POA: Diagnosis not present

## 2021-03-05 DIAGNOSIS — J449 Chronic obstructive pulmonary disease, unspecified: Secondary | ICD-10-CM | POA: Diagnosis not present

## 2021-03-05 DIAGNOSIS — M869 Osteomyelitis, unspecified: Secondary | ICD-10-CM | POA: Diagnosis not present

## 2021-03-05 DIAGNOSIS — I130 Hypertensive heart and chronic kidney disease with heart failure and stage 1 through stage 4 chronic kidney disease, or unspecified chronic kidney disease: Secondary | ICD-10-CM | POA: Diagnosis not present

## 2021-03-05 DIAGNOSIS — K651 Peritoneal abscess: Secondary | ICD-10-CM | POA: Diagnosis not present

## 2021-03-05 DIAGNOSIS — Z9884 Bariatric surgery status: Secondary | ICD-10-CM | POA: Diagnosis not present

## 2021-03-05 DIAGNOSIS — F32A Depression, unspecified: Secondary | ICD-10-CM | POA: Diagnosis not present

## 2021-03-05 DIAGNOSIS — I890 Lymphedema, not elsewhere classified: Secondary | ICD-10-CM | POA: Diagnosis not present

## 2021-03-05 DIAGNOSIS — G928 Other toxic encephalopathy: Secondary | ICD-10-CM | POA: Diagnosis not present

## 2021-03-05 DIAGNOSIS — E785 Hyperlipidemia, unspecified: Secondary | ICD-10-CM | POA: Diagnosis not present

## 2021-03-05 DIAGNOSIS — D751 Secondary polycythemia: Secondary | ICD-10-CM | POA: Diagnosis not present

## 2021-03-05 DIAGNOSIS — M1990 Unspecified osteoarthritis, unspecified site: Secondary | ICD-10-CM | POA: Diagnosis not present

## 2021-03-05 DIAGNOSIS — N170 Acute kidney failure with tubular necrosis: Secondary | ICD-10-CM | POA: Diagnosis not present

## 2021-03-05 DIAGNOSIS — E559 Vitamin D deficiency, unspecified: Secondary | ICD-10-CM | POA: Diagnosis not present

## 2021-03-05 DIAGNOSIS — J309 Allergic rhinitis, unspecified: Secondary | ICD-10-CM | POA: Diagnosis not present

## 2021-03-05 DIAGNOSIS — T8149XA Infection following a procedure, other surgical site, initial encounter: Secondary | ICD-10-CM | POA: Diagnosis not present

## 2021-03-05 DIAGNOSIS — I4891 Unspecified atrial fibrillation: Secondary | ICD-10-CM | POA: Diagnosis not present

## 2021-03-05 DIAGNOSIS — Z87891 Personal history of nicotine dependence: Secondary | ICD-10-CM | POA: Diagnosis not present

## 2021-03-05 DIAGNOSIS — I5032 Chronic diastolic (congestive) heart failure: Secondary | ICD-10-CM | POA: Diagnosis not present

## 2021-03-05 DIAGNOSIS — F419 Anxiety disorder, unspecified: Secondary | ICD-10-CM | POA: Diagnosis not present

## 2021-03-05 DIAGNOSIS — M1030 Gout due to renal impairment, unspecified site: Secondary | ICD-10-CM | POA: Diagnosis not present

## 2021-03-05 DIAGNOSIS — Z6841 Body Mass Index (BMI) 40.0 and over, adult: Secondary | ICD-10-CM | POA: Diagnosis not present

## 2021-03-05 DIAGNOSIS — E662 Morbid (severe) obesity with alveolar hypoventilation: Secondary | ICD-10-CM | POA: Diagnosis not present

## 2021-03-06 ENCOUNTER — Telehealth: Payer: Self-pay | Admitting: Orthopedic Surgery

## 2021-03-06 ENCOUNTER — Other Ambulatory Visit: Payer: Self-pay | Admitting: Interventional Cardiology

## 2021-03-06 NOTE — Telephone Encounter (Signed)
Pt sister called about FMLA paperwork  CB [phone]

## 2021-03-06 NOTE — Telephone Encounter (Signed)
Form completed and holding until Monday for Dr. Jess Barters signature.

## 2021-03-09 NOTE — Telephone Encounter (Signed)
Form has been faxed.  I tried to call number listed below and message states that call can not be completed at this time.

## 2021-03-10 ENCOUNTER — Ambulatory Visit (INDEPENDENT_AMBULATORY_CARE_PROVIDER_SITE_OTHER): Payer: Medicare Other

## 2021-03-10 ENCOUNTER — Other Ambulatory Visit: Payer: Self-pay

## 2021-03-10 DIAGNOSIS — I480 Paroxysmal atrial fibrillation: Secondary | ICD-10-CM | POA: Diagnosis not present

## 2021-03-10 DIAGNOSIS — Z5181 Encounter for therapeutic drug level monitoring: Secondary | ICD-10-CM | POA: Diagnosis not present

## 2021-03-10 LAB — POCT INR: INR: 2.7 (ref 2.0–3.0)

## 2021-03-10 NOTE — Patient Instructions (Signed)
Description   Continue taking 1/2  tablet every day except 1 tablet on Monday, Wednesday and Friday.   Recheck INR in 2 weeks. [facility] # [phone].

## 2021-03-11 DIAGNOSIS — I130 Hypertensive heart and chronic kidney disease with heart failure and stage 1 through stage 4 chronic kidney disease, or unspecified chronic kidney disease: Secondary | ICD-10-CM | POA: Diagnosis not present

## 2021-03-11 DIAGNOSIS — N170 Acute kidney failure with tubular necrosis: Secondary | ICD-10-CM | POA: Diagnosis not present

## 2021-03-11 DIAGNOSIS — G928 Other toxic encephalopathy: Secondary | ICD-10-CM | POA: Diagnosis not present

## 2021-03-11 DIAGNOSIS — M869 Osteomyelitis, unspecified: Secondary | ICD-10-CM | POA: Diagnosis not present

## 2021-03-11 DIAGNOSIS — T8149XA Infection following a procedure, other surgical site, initial encounter: Secondary | ICD-10-CM | POA: Diagnosis not present

## 2021-03-11 DIAGNOSIS — K651 Peritoneal abscess: Secondary | ICD-10-CM | POA: Diagnosis not present

## 2021-03-12 ENCOUNTER — Other Ambulatory Visit: Payer: Self-pay

## 2021-03-12 ENCOUNTER — Ambulatory Visit (INDEPENDENT_AMBULATORY_CARE_PROVIDER_SITE_OTHER): Payer: Medicare Other | Admitting: Orthopedic Surgery

## 2021-03-12 DIAGNOSIS — L02416 Cutaneous abscess of left lower limb: Secondary | ICD-10-CM

## 2021-03-12 DIAGNOSIS — F329 Major depressive disorder, single episode, unspecified: Secondary | ICD-10-CM | POA: Diagnosis not present

## 2021-03-12 DIAGNOSIS — I5032 Chronic diastolic (congestive) heart failure: Secondary | ICD-10-CM | POA: Diagnosis not present

## 2021-03-12 DIAGNOSIS — J449 Chronic obstructive pulmonary disease, unspecified: Secondary | ICD-10-CM | POA: Diagnosis not present

## 2021-03-12 DIAGNOSIS — I4821 Permanent atrial fibrillation: Secondary | ICD-10-CM | POA: Diagnosis not present

## 2021-03-12 DIAGNOSIS — E785 Hyperlipidemia, unspecified: Secondary | ICD-10-CM | POA: Diagnosis not present

## 2021-03-12 DIAGNOSIS — D649 Anemia, unspecified: Secondary | ICD-10-CM | POA: Diagnosis not present

## 2021-03-12 DIAGNOSIS — Z09 Encounter for follow-up examination after completed treatment for conditions other than malignant neoplasm: Secondary | ICD-10-CM | POA: Diagnosis not present

## 2021-03-12 DIAGNOSIS — N183 Chronic kidney disease, stage 3 unspecified: Secondary | ICD-10-CM | POA: Diagnosis not present

## 2021-03-12 DIAGNOSIS — R7301 Impaired fasting glucose: Secondary | ICD-10-CM | POA: Diagnosis not present

## 2021-03-12 DIAGNOSIS — J849 Interstitial pulmonary disease, unspecified: Secondary | ICD-10-CM | POA: Diagnosis not present

## 2021-03-12 DIAGNOSIS — I10 Essential (primary) hypertension: Secondary | ICD-10-CM | POA: Diagnosis not present

## 2021-03-12 DIAGNOSIS — S301XXS Contusion of abdominal wall, sequela: Secondary | ICD-10-CM | POA: Diagnosis not present

## 2021-03-13 DIAGNOSIS — T8149XA Infection following a procedure, other surgical site, initial encounter: Secondary | ICD-10-CM | POA: Diagnosis not present

## 2021-03-13 DIAGNOSIS — I130 Hypertensive heart and chronic kidney disease with heart failure and stage 1 through stage 4 chronic kidney disease, or unspecified chronic kidney disease: Secondary | ICD-10-CM | POA: Diagnosis not present

## 2021-03-13 DIAGNOSIS — G928 Other toxic encephalopathy: Secondary | ICD-10-CM | POA: Diagnosis not present

## 2021-03-13 DIAGNOSIS — K651 Peritoneal abscess: Secondary | ICD-10-CM | POA: Diagnosis not present

## 2021-03-13 DIAGNOSIS — M869 Osteomyelitis, unspecified: Secondary | ICD-10-CM | POA: Diagnosis not present

## 2021-03-13 DIAGNOSIS — N170 Acute kidney failure with tubular necrosis: Secondary | ICD-10-CM | POA: Diagnosis not present

## 2021-03-15 ENCOUNTER — Encounter: Payer: Self-pay | Admitting: Orthopedic Surgery
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
## 2021-03-16 ENCOUNTER — Other Ambulatory Visit: Payer: Self-pay | Admitting: Interventional Cardiology

## 2021-03-18 ENCOUNTER — Encounter: Payer: Self-pay | Admitting: Orthopedic Surgery

## 2021-03-18 DIAGNOSIS — T8149XA Infection following a procedure, other surgical site, initial encounter: Secondary | ICD-10-CM | POA: Diagnosis not present

## 2021-03-18 DIAGNOSIS — G928 Other toxic encephalopathy: Secondary | ICD-10-CM | POA: Diagnosis not present

## 2021-03-18 DIAGNOSIS — K651 Peritoneal abscess: Secondary | ICD-10-CM | POA: Diagnosis not present

## 2021-03-18 DIAGNOSIS — I130 Hypertensive heart and chronic kidney disease with heart failure and stage 1 through stage 4 chronic kidney disease, or unspecified chronic kidney disease: Secondary | ICD-10-CM | POA: Diagnosis not present

## 2021-03-18 DIAGNOSIS — N170 Acute kidney failure with tubular necrosis: Secondary | ICD-10-CM | POA: Diagnosis not present

## 2021-03-18 DIAGNOSIS — M869 Osteomyelitis, unspecified: Secondary | ICD-10-CM | POA: Diagnosis not present

## 2021-03-22 ENCOUNTER — Other Ambulatory Visit: Payer: Self-pay | Admitting: Interventional Cardiology

## 2021-03-23 ENCOUNTER — Ambulatory Visit: Payer: Medicare Other | Admitting: Orthopedic Surgery

## 2021-03-24 ENCOUNTER — Ambulatory Visit (INDEPENDENT_AMBULATORY_CARE_PROVIDER_SITE_OTHER): Payer: Medicare Other

## 2021-03-24 ENCOUNTER — Other Ambulatory Visit: Payer: Self-pay

## 2021-03-24 DIAGNOSIS — I129 Hypertensive chronic kidney disease with stage 1 through stage 4 chronic kidney disease, or unspecified chronic kidney disease: Secondary | ICD-10-CM | POA: Diagnosis not present

## 2021-03-24 DIAGNOSIS — M869 Osteomyelitis, unspecified: Secondary | ICD-10-CM | POA: Diagnosis not present

## 2021-03-24 DIAGNOSIS — Z5181 Encounter for therapeutic drug level monitoring: Secondary | ICD-10-CM

## 2021-03-24 DIAGNOSIS — I4891 Unspecified atrial fibrillation: Secondary | ICD-10-CM | POA: Diagnosis not present

## 2021-03-24 DIAGNOSIS — I480 Paroxysmal atrial fibrillation: Secondary | ICD-10-CM

## 2021-03-24 DIAGNOSIS — N2581 Secondary hyperparathyroidism of renal origin: Secondary | ICD-10-CM | POA: Diagnosis not present

## 2021-03-24 DIAGNOSIS — N179 Acute kidney failure, unspecified: Secondary | ICD-10-CM | POA: Diagnosis not present

## 2021-03-24 DIAGNOSIS — N184 Chronic kidney disease, stage 4 (severe): Secondary | ICD-10-CM | POA: Diagnosis not present

## 2021-03-24 LAB — POCT INR: INR: 3.5 — AB (ref 2.0–3.0)

## 2021-03-24 NOTE — Patient Instructions (Signed)
Description   Continue taking 1/2  tablet every day except 1 tablet on Monday, Wednesday and Friday.   Recheck INR in 3 weeks. Coumadin Clinic # [phone].

## 2021-03-25 DIAGNOSIS — K651 Peritoneal abscess: Secondary | ICD-10-CM | POA: Diagnosis not present

## 2021-03-25 DIAGNOSIS — N170 Acute kidney failure with tubular necrosis: Secondary | ICD-10-CM | POA: Diagnosis not present

## 2021-03-25 DIAGNOSIS — I130 Hypertensive heart and chronic kidney disease with heart failure and stage 1 through stage 4 chronic kidney disease, or unspecified chronic kidney disease: Secondary | ICD-10-CM | POA: Diagnosis not present

## 2021-03-25 DIAGNOSIS — T8149XA Infection following a procedure, other surgical site, initial encounter: Secondary | ICD-10-CM | POA: Diagnosis not present

## 2021-03-25 DIAGNOSIS — G928 Other toxic encephalopathy: Secondary | ICD-10-CM | POA: Diagnosis not present

## 2021-03-25 DIAGNOSIS — M869 Osteomyelitis, unspecified: Secondary | ICD-10-CM | POA: Diagnosis not present

## 2021-03-26 ENCOUNTER — Ambulatory Visit (INDEPENDENT_AMBULATORY_CARE_PROVIDER_SITE_OTHER): Payer: Medicare Other | Admitting: Orthopedic Surgery

## 2021-03-26 DIAGNOSIS — M96840 Postprocedural hematoma of a musculoskeletal structure following a musculoskeletal system procedure: Secondary | ICD-10-CM

## 2021-03-26 DIAGNOSIS — L02416 Cutaneous abscess of left lower limb: Secondary | ICD-10-CM

## 2021-03-27 DIAGNOSIS — K651 Peritoneal abscess: Secondary | ICD-10-CM | POA: Diagnosis not present

## 2021-03-27 DIAGNOSIS — T8149XA Infection following a procedure, other surgical site, initial encounter: Secondary | ICD-10-CM | POA: Diagnosis not present

## 2021-03-27 DIAGNOSIS — I130 Hypertensive heart and chronic kidney disease with heart failure and stage 1 through stage 4 chronic kidney disease, or unspecified chronic kidney disease: Secondary | ICD-10-CM | POA: Diagnosis not present

## 2021-03-27 DIAGNOSIS — N170 Acute kidney failure with tubular necrosis: Secondary | ICD-10-CM | POA: Diagnosis not present

## 2021-03-27 DIAGNOSIS — G928 Other toxic encephalopathy: Secondary | ICD-10-CM | POA: Diagnosis not present

## 2021-03-27 DIAGNOSIS — M869 Osteomyelitis, unspecified: Secondary | ICD-10-CM | POA: Diagnosis not present

## 2021-03-30 ENCOUNTER — Encounter: Payer: Self-pay | Admitting: Orthopedic Surgery
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
## 2021-03-31 ENCOUNTER — Telehealth: Payer: Self-pay | Admitting: Oncology

## 2021-03-31 DIAGNOSIS — T8149XA Infection following a procedure, other surgical site, initial encounter: Secondary | ICD-10-CM | POA: Diagnosis not present

## 2021-03-31 DIAGNOSIS — M869 Osteomyelitis, unspecified: Secondary | ICD-10-CM | POA: Diagnosis not present

## 2021-03-31 DIAGNOSIS — N170 Acute kidney failure with tubular necrosis: Secondary | ICD-10-CM | POA: Diagnosis not present

## 2021-03-31 DIAGNOSIS — G928 Other toxic encephalopathy: Secondary | ICD-10-CM | POA: Diagnosis not present

## 2021-03-31 DIAGNOSIS — I130 Hypertensive heart and chronic kidney disease with heart failure and stage 1 through stage 4 chronic kidney disease, or unspecified chronic kidney disease: Secondary | ICD-10-CM | POA: Diagnosis not present

## 2021-03-31 DIAGNOSIS — K651 Peritoneal abscess: Secondary | ICD-10-CM | POA: Diagnosis not present

## 2021-03-31 NOTE — Telephone Encounter (Signed)
Patient rescheduled 11/2 Follow Up to 11/4 at 4:30 pm - Due to No Transportation

## 2021-04-01 ENCOUNTER — Ambulatory Visit: Payer: Medicare Other | Admitting: Oncology

## 2021-04-02 ENCOUNTER — Other Ambulatory Visit: Payer: Self-pay | Admitting: Oncology

## 2021-04-02 NOTE — Progress Notes (Signed)
[facility]  [address] [place],  [place]  [address] [phone]  Clinic Day:  04/05/2021  Referring physician: [name], FNP   HISTORY OF PRESENT ILLNESS:  The patient is a 60 y.o. female with hemochromatosis, who harbors 1 abnormal H63D mutation.  However, she comes in today for anemia.  The patient denies having any overt forms of blood loss to explain her anemia.  However, she claims she has had acute renal failure. Labs at her nephrologist's office have shown her kidney to have improved moderately over this month.  She has been on diuretics, but denies being placed on any medications which are known to cause anemia via bone marrow suppression.  PHYSICAL EXAM:  Blood pressure 117/67, pulse 86, temperature 98.8 F (37.1 C), resp. rate 16, height 5\' 6"  (1.676 m), weight 268 lb 12.8 oz (121.9 kg), SpO2 94 %. Wt Readings from Last 3 Encounters:  04/03/21 268 lb 12.8 oz (121.9 kg)  02/20/21 282 lb 3 oz (128 kg)  01/29/21 289 lb 14.5 oz (131.5 kg)   Body mass index is 43.39 kg/m. Performance status (ECOG): 2 - Symptomatic, <50% confined to bed Physical Exam Constitutional:      Appearance: Normal appearance. She is not ill-appearing.  HENT:     Mouth/Throat:     Mouth: Mucous membranes are moist.     Pharynx: Oropharynx is clear. No oropharyngeal exudate or posterior oropharyngeal erythema.  Cardiovascular:     Rate and Rhythm: Normal rate and regular rhythm.     Heart sounds: No murmur heard.   No friction rub. No gallop.  Pulmonary:     Effort: Pulmonary effort is normal. No respiratory distress.     Breath sounds: Normal breath sounds. No wheezing, rhonchi or rales.  Abdominal:     General: Bowel sounds are normal. There is no distension.     Palpations: Abdomen is soft. There is no mass.     Tenderness: There is no abdominal tenderness.  Musculoskeletal:        General: No swelling.     Right lower leg: Swelling present. No  edema.     Left lower leg: No edema.  Lymphadenopathy:     Cervical: No cervical adenopathy.     Upper Body:     Right upper body: No supraclavicular or axillary adenopathy.     Left upper body: No supraclavicular or axillary adenopathy.     Lower Body: No right inguinal adenopathy. No left inguinal adenopathy.  Skin:    General: Skin is warm.     Coloration: Skin is not jaundiced.     Findings: No lesion or rash.  Neurological:     General: No focal deficit present.     Mental Status: She is alert and oriented to person, place, and time. Mental status is at baseline.  Psychiatric:        Mood and Affect: Mood normal.        Behavior: Behavior normal.        Thought Content: Thought content normal.    LABS:    CBC Latest Ref Rng & Units 04/03/2021 03/02/2021 03/01/2021  WBC - 4.4 7.1 7.9  Hemoglobin 12.0 - 16.0 9.3(A) 8.3(L) 8.5(L)  Hematocrit 36 - 46 28(A) 26.5(L) 27.6(L)  Platelets 150 - 399 194 127(L) 134(L)   CMP Latest Ref Rng & Units 04/03/2021 03/04/2021 03/03/2021  Glucose 70 - 99 mg/dL - 90 125(H)  BUN 4 - 21 12 41(H) 44(H)  Creatinine 0.5 - 1.1 0.9 4.67(H) 4.92(H)  Sodium 137 - 147 141 139 140  Potassium 3.4 - 5.3 2.8(A) 3.2(L) 3.4(L)  Chloride 99 - 108 105 103 104  CO2 13 - 22 26(A) 28 28  Calcium 8.7 - 10.7 8.7 9.7 9.8  Total Protein 6.5 - 8.1 g/dL - - -  Total Bilirubin 0.3 - 1.2 mg/dL - - -  Alkaline Phos 25 - 125 65 - -  AST 13 - 35 33 - -  ALT 7 - 35 10 - -   ASSESSMENT & PLAN:  Assessment/Plan:  A 60 y.o. female with with hemochromatosis, who harbors 1 abnormal H63D mutation.  However, she is here today for anemia.  Since her last visit at our office in 2020, her hemoglobin has fallen by over 4 grams.  My concern is there may be a problem with blood production or hemolysis, moreso than her losing blood.  When looking at her peripheral smear, she has occasional rouleaux formation and teardrop red cells.  I really do not sense hypochromic red cells.  For  completeness, I will check her iron, B12, and folate levels to ensure no nutritional deficiencies are present.  I will check a serum protein electrophoresis to ensure an underlying plasma cell dyscrasia is not present.  As the patient has 2 mechanical valves, I will check an LDH and haptoglobin level to ensure valve-related hemolysis is not present.  I will see her back in 1 week to go over all of her labs and their implications. The patient understands all the plans discussed today and is in agreement with them.  Tzion Wedel Macarthur Critchley, MD

## 2021-04-03 ENCOUNTER — Encounter: Payer: Self-pay | Admitting: Oncology

## 2021-04-03 ENCOUNTER — Other Ambulatory Visit: Payer: Self-pay | Admitting: Oncology

## 2021-04-03 ENCOUNTER — Inpatient Hospital Stay: Payer: Medicare Other

## 2021-04-03 ENCOUNTER — Other Ambulatory Visit: Payer: Self-pay | Admitting: Hematology and Oncology

## 2021-04-03 ENCOUNTER — Encounter: Payer: Self-pay | Admitting: Hematology and Oncology

## 2021-04-03 ENCOUNTER — Inpatient Hospital Stay: Payer: Medicare Other | Attending: Oncology | Admitting: Oncology

## 2021-04-03 VITALS — BP 117/67 | HR 86 | Temp 98.8°F | Resp 16 | Ht 66.0 in | Wt 268.8 lb

## 2021-04-03 DIAGNOSIS — D539 Nutritional anemia, unspecified: Secondary | ICD-10-CM

## 2021-04-03 DIAGNOSIS — D649 Anemia, unspecified: Secondary | ICD-10-CM | POA: Diagnosis not present

## 2021-04-03 LAB — BASIC METABOLIC PANEL
BUN: 12 (ref 4–21)
CO2: 26 — AB (ref 13–22)
Chloride: 105 (ref 99–108)
Creatinine: 0.9 (ref 0.5–1.1)
Glucose: 100
Potassium: 2.8 — AB (ref 3.4–5.3)
Sodium: 141 (ref 137–147)

## 2021-04-03 LAB — CBC AND DIFFERENTIAL
HCT: 28 — AB (ref 36–46)
Hemoglobin: 9.3 — AB (ref 12.0–16.0)
Neutrophils Absolute: 3.04
Platelets: 194 (ref 150–399)
WBC: 4.4

## 2021-04-03 LAB — IRON AND TIBC
Iron: 27 ug/dL — ABNORMAL LOW (ref 28–170)
Saturation Ratios: 14 % (ref 10.4–31.8)
TIBC: 194 ug/dL — ABNORMAL LOW (ref 250–450)
UIBC: 167 ug/dL

## 2021-04-03 LAB — RETICULOCYTES
Immature Retic Fract: 14.9 % (ref 2.3–15.9)
RBC.: 3.13 MIL/uL — ABNORMAL LOW (ref 3.87–5.11)
Retic Count, Absolute: 48.5 10*3/uL (ref 19.0–186.0)
Retic Ct Pct: 1.6 % (ref 0.4–3.1)

## 2021-04-03 LAB — HEPATIC FUNCTION PANEL
ALT: 10 (ref 7–35)
AST: 33 (ref 13–35)
Alkaline Phosphatase: 65 (ref 25–125)
Bilirubin, Total: 0.7

## 2021-04-03 LAB — LACTATE DEHYDROGENASE: LDH: 325 U/L — ABNORMAL HIGH (ref 98–192)

## 2021-04-03 LAB — VITAMIN B12: Vitamin B-12: 248 pg/mL (ref 180–914)

## 2021-04-03 LAB — COMPREHENSIVE METABOLIC PANEL
Albumin: 3.4 — AB (ref 3.5–5.0)
Calcium: 8.7 (ref 8.7–10.7)

## 2021-04-03 LAB — CBC: RBC: 3.1 — AB (ref 3.87–5.11)

## 2021-04-03 LAB — FERRITIN: Ferritin: 194 ng/mL (ref 11–307)

## 2021-04-03 LAB — FOLATE: Folate: 10 ng/mL (ref >5.9)

## 2021-04-03 MED ORDER — POTASSIUM CHLORIDE CRYS ER 20 MEQ PO TBCR: 20.0000 meq | EXTENDED_RELEASE_TABLET | Freq: Every day | ORAL | 0 refills | Status: DC

## 2021-04-04 DIAGNOSIS — J449 Chronic obstructive pulmonary disease, unspecified: Secondary | ICD-10-CM | POA: Diagnosis not present

## 2021-04-04 DIAGNOSIS — M869 Osteomyelitis, unspecified: Secondary | ICD-10-CM | POA: Diagnosis not present

## 2021-04-04 DIAGNOSIS — E662 Morbid (severe) obesity with alveolar hypoventilation: Secondary | ICD-10-CM | POA: Diagnosis not present

## 2021-04-04 DIAGNOSIS — Z6841 Body Mass Index (BMI) 40.0 and over, adult: Secondary | ICD-10-CM | POA: Diagnosis not present

## 2021-04-04 DIAGNOSIS — E785 Hyperlipidemia, unspecified: Secondary | ICD-10-CM | POA: Diagnosis not present

## 2021-04-04 DIAGNOSIS — D751 Secondary polycythemia: Secondary | ICD-10-CM | POA: Diagnosis not present

## 2021-04-04 DIAGNOSIS — I5032 Chronic diastolic (congestive) heart failure: Secondary | ICD-10-CM | POA: Diagnosis not present

## 2021-04-04 DIAGNOSIS — K651 Peritoneal abscess: Secondary | ICD-10-CM | POA: Diagnosis not present

## 2021-04-04 DIAGNOSIS — N183 Chronic kidney disease, stage 3 unspecified: Secondary | ICD-10-CM | POA: Diagnosis not present

## 2021-04-04 DIAGNOSIS — E559 Vitamin D deficiency, unspecified: Secondary | ICD-10-CM | POA: Diagnosis not present

## 2021-04-04 DIAGNOSIS — I130 Hypertensive heart and chronic kidney disease with heart failure and stage 1 through stage 4 chronic kidney disease, or unspecified chronic kidney disease: Secondary | ICD-10-CM | POA: Diagnosis not present

## 2021-04-04 DIAGNOSIS — N170 Acute kidney failure with tubular necrosis: Secondary | ICD-10-CM | POA: Diagnosis not present

## 2021-04-04 DIAGNOSIS — M1030 Gout due to renal impairment, unspecified site: Secondary | ICD-10-CM | POA: Diagnosis not present

## 2021-04-04 DIAGNOSIS — G928 Other toxic encephalopathy: Secondary | ICD-10-CM | POA: Diagnosis not present

## 2021-04-04 DIAGNOSIS — T8149XA Infection following a procedure, other surgical site, initial encounter: Secondary | ICD-10-CM | POA: Diagnosis not present

## 2021-04-04 DIAGNOSIS — Z87891 Personal history of nicotine dependence: Secondary | ICD-10-CM | POA: Diagnosis not present

## 2021-04-04 DIAGNOSIS — J309 Allergic rhinitis, unspecified: Secondary | ICD-10-CM | POA: Diagnosis not present

## 2021-04-04 DIAGNOSIS — I4892 Unspecified atrial flutter: Secondary | ICD-10-CM | POA: Diagnosis not present

## 2021-04-04 DIAGNOSIS — Z9884 Bariatric surgery status: Secondary | ICD-10-CM | POA: Diagnosis not present

## 2021-04-04 DIAGNOSIS — F419 Anxiety disorder, unspecified: Secondary | ICD-10-CM | POA: Diagnosis not present

## 2021-04-04 DIAGNOSIS — I890 Lymphedema, not elsewhere classified: Secondary | ICD-10-CM | POA: Diagnosis not present

## 2021-04-04 DIAGNOSIS — M1990 Unspecified osteoarthritis, unspecified site: Secondary | ICD-10-CM | POA: Diagnosis not present

## 2021-04-04 DIAGNOSIS — Z952 Presence of prosthetic heart valve: Secondary | ICD-10-CM | POA: Diagnosis not present

## 2021-04-04 DIAGNOSIS — I4891 Unspecified atrial fibrillation: Secondary | ICD-10-CM | POA: Diagnosis not present

## 2021-04-04 DIAGNOSIS — F32A Depression, unspecified: Secondary | ICD-10-CM | POA: Diagnosis not present

## 2021-04-04 LAB — HAPTOGLOBIN: Haptoglobin: <10 mg/dL — ABNORMAL LOW (ref 37–355)

## 2021-04-07 LAB — PROTEIN ELECTROPHORESIS, SERUM
A/G Ratio: 1.1 (ref 0.7–1.7)
Albumin ELP: 3.2 g/dL (ref 2.9–4.4)
Alpha-1-Globulin: 0.3 g/dL (ref 0.0–0.4)
Alpha-2-Globulin: 0.5 g/dL (ref 0.4–1.0)
Beta Globulin: 0.9 g/dL (ref 0.7–1.3)
Gamma Globulin: 1.1 g/dL (ref 0.4–1.8)
Globulin, Total: 2.9 g/dL (ref 2.2–3.9)
Total Protein ELP: 6.1 g/dL (ref 6.0–8.5)

## 2021-04-08 NOTE — Progress Notes (Signed)
[facility]  [address] [place],  [place]  [address] [phone]  Clinic Day:  04/09/2021  Referring physician: [name], FNP  This document serves as a record of services personally performed by Marice Potter, MD. It was created on their behalf by [facility] E, a trained medical scribe. The creation of this record is based on the scribe's personal observations and the provider's statements to them.  HISTORY OF PRESENT ILLNESS:  The patient is a 60 y.o. female with new-onset anemia.  She comes in today to go over her labs to determine its etiology.  The patient denies having any overt forms of blood loss to explain her anemia. She remains fatigued.  She also has hemochromatosis, who harbors 1 abnormal H63D mutation.  PHYSICAL EXAM:  Blood pressure 139/76, pulse 66, temperature 98.7 F (37.1 C), resp. rate 16, height 5\' 6"  (1.676 m), weight 269 lb 12.8 oz (122.4 kg). Wt Readings from Last 3 Encounters:  04/09/21 269 lb 12.8 oz (122.4 kg)  04/03/21 268 lb 12.8 oz (121.9 kg)  02/20/21 282 lb 3 oz (128 kg)   Body mass index is 43.55 kg/m. Performance status (ECOG): 2 - Symptomatic, <50% confined to bed Physical Exam Constitutional:      Appearance: Normal appearance. She is not ill-appearing.  HENT:     Mouth/Throat:     Mouth: Mucous membranes are moist.     Pharynx: Oropharynx is clear. No oropharyngeal exudate or posterior oropharyngeal erythema.  Cardiovascular:     Rate and Rhythm: Normal rate and regular rhythm.     Heart sounds: No murmur heard.   No friction rub. No gallop.  Pulmonary:     Effort: Pulmonary effort is normal. No respiratory distress.     Breath sounds: Normal breath sounds. No wheezing, rhonchi or rales.  Abdominal:     General: Bowel sounds are normal. There is no distension.     Palpations: Abdomen is soft. There is no mass.     Tenderness: There is no abdominal tenderness.  Musculoskeletal:         General: No swelling.     Right lower leg: Swelling present. No edema.     Left lower leg: No edema.  Lymphadenopathy:     Cervical: No cervical adenopathy.     Upper Body:     Right upper body: No supraclavicular or axillary adenopathy.     Left upper body: No supraclavicular or axillary adenopathy.     Lower Body: No right inguinal adenopathy. No left inguinal adenopathy.  Skin:    General: Skin is warm.     Coloration: Skin is not jaundiced.     Findings: No lesion or rash.  Neurological:     General: No focal deficit present.     Mental Status: She is alert and oriented to person, place, and time. Mental status is at baseline.  Psychiatric:        Mood and Affect: Mood normal.        Behavior: Behavior normal.        Thought Content: Thought content normal.    LABS:   CBC Latest Ref Rng & Units 04/09/2021 04/03/2021 03/02/2021  WBC - 7.5 4.4 7.1  Hemoglobin 12.0 - 16.0 9.7(A) 9.3(A) 8.3(L)  Hematocrit 36 - 46 31(A) 28(A) 26.5(L)  Platelets 150 - 399 281 194 127(L)   CMP Latest Ref Rng & Units 04/09/2021 04/03/2021 03/04/2021  Glucose 70 - 99 mg/dL - - 90  BUN 4 - 21 11 12  41(H)  Creatinine 0.5 - 1.1 0.8 0.9 4.67(H)  Sodium 137 - 147 141 141 139  Potassium 3.4 - 5.3 3.2(A) 2.8(A) 3.2(L)  Chloride 99 - 108 109(A) 105 103  CO2 13 - 22 27(A) 26(A) 28  Calcium 8.7 - 10.7 9.2 8.7 9.7  Total Protein 6.5 - 8.1 g/dL - - -  Total Bilirubin 0.3 - 1.2 mg/dL - - -  Alkaline Phos 25 - 125 75 65 -  AST 13 - 35 28 33 -  ALT 7 - 35 8 10 -  Results for CHRISMA, HURLOCK (MRN [id number]) as of 04/09/2021 17:51  Ref. Range 04/03/2021 16:10  LDH Latest Ref Range: 98 - 192 U/L 325 (H)     Ref. Range 04/03/2021 15:35 04/03/2021 16:06  Iron Latest Ref Range: 28 - 170 ug/dL 27 (L)   UIBC Latest Units: ug/dL 167   TIBC Latest Ref Range: 250 - 450 ug/dL 194 (L)   Saturation Ratios Latest Ref Range: 10.4 - 31.8 % 14   Ferritin Latest Ref Range: 11 - 307 ng/mL 194   Folate Latest Ref Range:  >5.9 ng/mL  10.0  Vitamin B12 Latest Ref Range: 180 - 914 pg/mL  248   ASSESSMENT & PLAN:  Assessment/Plan:  A 60 y.o. female with new-onset anemia.  Based upon her recent labs, this patient appears to have a hemolytic process taking place.  This is based upon her having an elevated LDH and undetectable haptoglobin.  This patient has 2 mechanical valves for which I fear may be shearing her red cells as they pass by them.  I did discuss her case with Dr Tamala Julian of cardiology, who will arrange for her to undergo an echocardiogram to better evaluate the functional status of her mechanical aortic and mitral valves.  I will defer all valvular management decisions to cardiology/cardiothoracic surgery.  I will tentatively see her back in 1 month for repeat clinical assessment.  She knows to contact us before then if she feels weaker to where her CBC may need to be checked sooner.  The patient and her sister understand all the plans discussed today and are in agreement with them.  I, Rita Ohara, am acting as scribe for Marice Potter, MD    I have reviewed this report as typed by the medical scribe, and it is complete and accurate.  Damonique Brunelle Macarthur Critchley, MD

## 2021-04-09 ENCOUNTER — Other Ambulatory Visit: Payer: Self-pay | Admitting: Oncology

## 2021-04-09 ENCOUNTER — Inpatient Hospital Stay (INDEPENDENT_AMBULATORY_CARE_PROVIDER_SITE_OTHER): Payer: Medicare Other | Admitting: Oncology

## 2021-04-09 ENCOUNTER — Inpatient Hospital Stay: Payer: Medicare Other

## 2021-04-09 ENCOUNTER — Telehealth: Payer: Self-pay

## 2021-04-09 ENCOUNTER — Telehealth: Payer: Self-pay | Admitting: Interventional Cardiology

## 2021-04-09 ENCOUNTER — Encounter: Payer: Self-pay | Admitting: Oncology

## 2021-04-09 VITALS — BP 139/76 | HR 66 | Temp 98.7°F | Resp 16 | Ht 66.0 in | Wt 269.8 lb

## 2021-04-09 DIAGNOSIS — N1831 Chronic kidney disease, stage 3a: Secondary | ICD-10-CM | POA: Diagnosis not present

## 2021-04-09 DIAGNOSIS — I5031 Acute diastolic (congestive) heart failure: Secondary | ICD-10-CM

## 2021-04-09 DIAGNOSIS — N184 Chronic kidney disease, stage 4 (severe): Secondary | ICD-10-CM | POA: Diagnosis not present

## 2021-04-09 DIAGNOSIS — I129 Hypertensive chronic kidney disease with stage 1 through stage 4 chronic kidney disease, or unspecified chronic kidney disease: Secondary | ICD-10-CM | POA: Diagnosis not present

## 2021-04-09 DIAGNOSIS — R109 Unspecified abdominal pain: Secondary | ICD-10-CM | POA: Diagnosis not present

## 2021-04-09 DIAGNOSIS — D649 Anemia, unspecified: Secondary | ICD-10-CM

## 2021-04-09 DIAGNOSIS — M869 Osteomyelitis, unspecified: Secondary | ICD-10-CM | POA: Diagnosis not present

## 2021-04-09 DIAGNOSIS — E785 Hyperlipidemia, unspecified: Secondary | ICD-10-CM | POA: Diagnosis not present

## 2021-04-09 DIAGNOSIS — Z952 Presence of prosthetic heart valve: Secondary | ICD-10-CM

## 2021-04-09 DIAGNOSIS — D599 Acquired hemolytic anemia, unspecified: Secondary | ICD-10-CM | POA: Diagnosis not present

## 2021-04-09 DIAGNOSIS — D539 Nutritional anemia, unspecified: Secondary | ICD-10-CM

## 2021-04-09 LAB — HEPATIC FUNCTION PANEL
ALT: 8 (ref 7–35)
AST: 28 (ref 13–35)
Alkaline Phosphatase: 75 (ref 25–125)
Bilirubin, Total: 0.7

## 2021-04-09 LAB — CBC: RBC: 3.36 — AB (ref 3.87–5.11)

## 2021-04-09 LAB — BASIC METABOLIC PANEL
BUN: 11 (ref 4–21)
CO2: 27 — AB (ref 13–22)
Chloride: 109 — AB (ref 99–108)
Creatinine: 0.8 (ref 0.5–1.1)
Glucose: 95
Potassium: 3.2 — AB (ref 3.4–5.3)
Sodium: 141 (ref 137–147)

## 2021-04-09 LAB — CBC AND DIFFERENTIAL
HCT: 31 — AB (ref 36–46)
Hemoglobin: 9.7 — AB (ref 12.0–16.0)
Neutrophils Absolute: 6
Platelets: 281 (ref 150–399)
WBC: 7.5

## 2021-04-09 LAB — COMPREHENSIVE METABOLIC PANEL
Albumin: 3.2 — AB (ref 3.5–5.0)
Calcium: 9.2 (ref 8.7–10.7)

## 2021-04-09 NOTE — Telephone Encounter (Signed)
[name] spoke with Dr. [name] and they agreed that pt needed an echo.  [name] wanted echo read by one of the structural readers.  Called pt and spoke with her sister, [name].  Scheduled echo for December 5th and appt with [name] December 19th.  Sister verbalized understanding and was in agreement with plan.

## 2021-04-09 NOTE — Telephone Encounter (Signed)
Dr. [name] would like to speak to [name] about this patient.   He can be reached at [phone] ext [phone].

## 2021-04-09 NOTE — Telephone Encounter (Signed)
Spoke with patient's sister [name]) and advised that Dr. [name] has spoken to her cardiologist [name] and their office will be contacting them asap to schedule an ECHO.  [name] is also concerned that her mechanical valves may be destroying her RBC's.

## 2021-04-10 NOTE — Telephone Encounter (Signed)
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 We have ordered a 2 D doppler echo and will proceed to TEE if necessary to determine lesion causing hemolysis.  Needs iron therapy.

## 2021-04-14 ENCOUNTER — Other Ambulatory Visit: Payer: Self-pay

## 2021-04-14 ENCOUNTER — Ambulatory Visit (INDEPENDENT_AMBULATORY_CARE_PROVIDER_SITE_OTHER): Payer: Medicare Other

## 2021-04-14 DIAGNOSIS — I480 Paroxysmal atrial fibrillation: Secondary | ICD-10-CM | POA: Diagnosis not present

## 2021-04-14 DIAGNOSIS — Z5181 Encounter for therapeutic drug level monitoring: Secondary | ICD-10-CM

## 2021-04-14 LAB — POCT INR: INR: 4.8 — AB (ref 2.0–3.0)

## 2021-04-14 NOTE — Telephone Encounter (Signed)
Pt called back and was agreeable to appt on 12/9.

## 2021-04-14 NOTE — Patient Instructions (Signed)
Description   Hold today's dose and tomorrow's dose and then continue taking 1/2  tablet every day except 1 tablet on Monday, Wednesday and Friday.   Recheck INR in 2 weeks. [facility] # [phone].

## 2021-04-14 NOTE — Telephone Encounter (Signed)
Moved appt up with [name] up to 12/9.  Called to confirm pt could make this appt.  Left message to call back or send MyChart message to confirm appt.

## 2021-04-15 ENCOUNTER — Telehealth: Payer: Self-pay | Admitting: Oncology

## 2021-04-15 NOTE — Telephone Encounter (Signed)
Per 11/10 LOS, patient schedule for 12/8 Labs, Follow Up - Left Msg

## 2021-04-16 ENCOUNTER — Ambulatory Visit (INDEPENDENT_AMBULATORY_CARE_PROVIDER_SITE_OTHER): Payer: Medicare Other | Admitting: Orthopedic Surgery

## 2021-04-16 ENCOUNTER — Other Ambulatory Visit: Payer: Self-pay

## 2021-04-16 DIAGNOSIS — L02416 Cutaneous abscess of left lower limb: Secondary | ICD-10-CM

## 2021-04-17 ENCOUNTER — Encounter: Payer: Self-pay | Admitting: Orthopedic Surgery

## 2021-04-17 DIAGNOSIS — K651 Peritoneal abscess: Secondary | ICD-10-CM | POA: Diagnosis not present

## 2021-04-17 DIAGNOSIS — N170 Acute kidney failure with tubular necrosis: Secondary | ICD-10-CM | POA: Diagnosis not present

## 2021-04-17 DIAGNOSIS — I130 Hypertensive heart and chronic kidney disease with heart failure and stage 1 through stage 4 chronic kidney disease, or unspecified chronic kidney disease: Secondary | ICD-10-CM | POA: Diagnosis not present

## 2021-04-17 DIAGNOSIS — M869 Osteomyelitis, unspecified: Secondary | ICD-10-CM | POA: Diagnosis not present

## 2021-04-17 DIAGNOSIS — G928 Other toxic encephalopathy: Secondary | ICD-10-CM | POA: Diagnosis not present

## 2021-04-17 DIAGNOSIS — T8149XA Infection following a procedure, other surgical site, initial encounter: Secondary | ICD-10-CM | POA: Diagnosis not present
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
## 2021-04-20 DIAGNOSIS — T8149XA Infection following a procedure, other surgical site, initial encounter: Secondary | ICD-10-CM | POA: Diagnosis not present

## 2021-04-20 DIAGNOSIS — K651 Peritoneal abscess: Secondary | ICD-10-CM | POA: Diagnosis not present

## 2021-04-20 DIAGNOSIS — M869 Osteomyelitis, unspecified: Secondary | ICD-10-CM | POA: Diagnosis not present

## 2021-04-20 DIAGNOSIS — G928 Other toxic encephalopathy: Secondary | ICD-10-CM | POA: Diagnosis not present

## 2021-04-20 DIAGNOSIS — N170 Acute kidney failure with tubular necrosis: Secondary | ICD-10-CM | POA: Diagnosis not present

## 2021-04-20 DIAGNOSIS — I130 Hypertensive heart and chronic kidney disease with heart failure and stage 1 through stage 4 chronic kidney disease, or unspecified chronic kidney disease: Secondary | ICD-10-CM | POA: Diagnosis not present

## 2021-04-29 DIAGNOSIS — L0291 Cutaneous abscess, unspecified: Secondary | ICD-10-CM | POA: Diagnosis not present

## 2021-04-29 DIAGNOSIS — F329 Major depressive disorder, single episode, unspecified: Secondary | ICD-10-CM | POA: Diagnosis not present

## 2021-04-29 DIAGNOSIS — I5032 Chronic diastolic (congestive) heart failure: Secondary | ICD-10-CM | POA: Diagnosis not present

## 2021-04-29 DIAGNOSIS — N183 Chronic kidney disease, stage 3 unspecified: Secondary | ICD-10-CM | POA: Diagnosis not present

## 2021-04-30 DIAGNOSIS — N182 Chronic kidney disease, stage 2 (mild): Secondary | ICD-10-CM | POA: Diagnosis not present

## 2021-04-30 DIAGNOSIS — I129 Hypertensive chronic kidney disease with stage 1 through stage 4 chronic kidney disease, or unspecified chronic kidney disease: Secondary | ICD-10-CM | POA: Diagnosis not present

## 2021-04-30 DIAGNOSIS — D649 Anemia, unspecified: Secondary | ICD-10-CM | POA: Diagnosis not present

## 2021-04-30 DIAGNOSIS — R109 Unspecified abdominal pain: Secondary | ICD-10-CM | POA: Diagnosis not present

## 2021-04-30 DIAGNOSIS — M869 Osteomyelitis, unspecified: Secondary | ICD-10-CM | POA: Diagnosis not present

## 2021-05-01 DIAGNOSIS — G928 Other toxic encephalopathy: Secondary | ICD-10-CM | POA: Diagnosis not present

## 2021-05-01 DIAGNOSIS — I130 Hypertensive heart and chronic kidney disease with heart failure and stage 1 through stage 4 chronic kidney disease, or unspecified chronic kidney disease: Secondary | ICD-10-CM | POA: Diagnosis not present

## 2021-05-01 DIAGNOSIS — M869 Osteomyelitis, unspecified: Secondary | ICD-10-CM | POA: Diagnosis not present

## 2021-05-01 DIAGNOSIS — N170 Acute kidney failure with tubular necrosis: Secondary | ICD-10-CM | POA: Diagnosis not present

## 2021-05-01 DIAGNOSIS — K651 Peritoneal abscess: Secondary | ICD-10-CM | POA: Diagnosis not present

## 2021-05-01 DIAGNOSIS — T8149XA Infection following a procedure, other surgical site, initial encounter: Secondary | ICD-10-CM | POA: Diagnosis not present

## 2021-05-04 ENCOUNTER — Other Ambulatory Visit: Payer: Self-pay

## 2021-05-04 ENCOUNTER — Ambulatory Visit (HOSPITAL_COMMUNITY): Payer: Medicare Other | Attending: Cardiovascular Disease

## 2021-05-04 DIAGNOSIS — Z952 Presence of prosthetic heart valve: Secondary | ICD-10-CM | POA: Insufficient documentation

## 2021-05-04 DIAGNOSIS — I5031 Acute diastolic (congestive) heart failure: Secondary | ICD-10-CM | POA: Diagnosis not present

## 2021-05-04 LAB — ECHOCARDIOGRAM COMPLETE
AR max vel: 1.04 cm2
AV Area VTI: 1.12 cm2
AV Area mean vel: 1.03 cm2
AV Mean grad: 16.3 mmHg
AV Peak grad: 32 mmHg
Ao pk vel: 2.83 m/s
Area-P 1/2: 2.29 cm2
MV VTI: 1.37 cm2
S' Lateral: 3.5 cm

## 2021-05-05 ENCOUNTER — Telehealth: Payer: Self-pay | Admitting: Interventional Cardiology

## 2021-05-05 NOTE — Telephone Encounter (Signed)
Pt reaching out for results... please advise.

## 2021-05-05 NOTE — Telephone Encounter (Signed)
Pt calling about echo results.  Advised [name] has not reviewed those yet and that I would be in touch as soon as he does.  Pt appreciative for call.

## 2021-05-07 ENCOUNTER — Other Ambulatory Visit: Payer: Medicare Other

## 2021-05-07 ENCOUNTER — Ambulatory Visit: Payer: Medicare Other | Admitting: Oncology

## 2021-05-07 NOTE — Progress Notes (Incomplete)
[facility]  [address] [place],  [place]  [address] [phone]  Clinic Day:  05/07/2021  Referring physician: [name], FNP  This document serves as a record of services personally performed by Marice Potter, MD. It was created on their behalf by [facility], a trained medical scribe. The creation of this record is based on the scribe's personal observations and the provider's statements to them.  HISTORY OF PRESENT ILLNESS:  The patient is a 60 y.o. female with new-onset anemia, who appears to have a hemolytic process taking place.  This is based upon her having an elevated LDH and undetectable haptoglobin.  This patient has 2 mechanical valves for which I fear may be shearing her red cells as they pass by them. She comes in today for repeat clinical assessment.  The patient denies having any overt forms of blood loss to explain her anemia. She remains fatigued.  She also has hemochromatosis, who harbors 1 abnormal H63D mutation.  PHYSICAL EXAM:  There were no vitals taken for this visit. Wt Readings from Last 3 Encounters:  04/09/21 269 lb 12.8 oz (122.4 kg)  04/03/21 268 lb 12.8 oz (121.9 kg)  02/20/21 282 lb 3 oz (128 kg)   There is no height or weight on file to calculate BMI. Performance status (ECOG): 2 - Symptomatic, <50% confined to bed Physical Exam Constitutional:      Appearance: Normal appearance. She is not ill-appearing.  HENT:     Mouth/Throat:     Mouth: Mucous membranes are moist.     Pharynx: Oropharynx is clear. No oropharyngeal exudate or posterior oropharyngeal erythema.  Cardiovascular:     Rate and Rhythm: Normal rate and regular rhythm.     Heart sounds: No murmur heard.   No friction rub. No gallop.  Pulmonary:     Effort: Pulmonary effort is normal. No respiratory distress.     Breath sounds: Normal breath sounds. No wheezing, rhonchi or rales.  Abdominal:     General: Bowel sounds are normal. There  is no distension.     Palpations: Abdomen is soft. There is no mass.     Tenderness: There is no abdominal tenderness.  Musculoskeletal:        General: No swelling.     Right lower leg: Swelling present. No edema.     Left lower leg: No edema.  Lymphadenopathy:     Cervical: No cervical adenopathy.     Upper Body:     Right upper body: No supraclavicular or axillary adenopathy.     Left upper body: No supraclavicular or axillary adenopathy.     Lower Body: No right inguinal adenopathy. No left inguinal adenopathy.  Skin:    General: Skin is warm.     Coloration: Skin is not jaundiced.     Findings: No lesion or rash.  Neurological:     General: No focal deficit present.     Mental Status: She is alert and oriented to person, place, and time. Mental status is at baseline.  Psychiatric:        Mood and Affect: Mood normal.        Behavior: Behavior normal.        Thought Content: Thought content normal.    LABS:   CBC Latest Ref Rng & Units 04/09/2021 04/03/2021 03/02/2021  WBC - 7.5 4.4 7.1  Hemoglobin 12.0 - 16.0 9.7(A) 9.3(A) 8.3(L)  Hematocrit 36 - 46 31(A) 28(A) 26.5(L)  Platelets 150 -  399 281 194 127(L)   CMP Latest Ref Rng & Units 04/09/2021 04/03/2021 03/04/2021  Glucose 70 - 99 mg/dL - - 90  BUN 4 - 21 11 12  41(H)  Creatinine 0.5 - 1.1 0.8 0.9 4.67(H)  Sodium 137 - 147 141 141 139  Potassium 3.4 - 5.3 3.2(A) 2.8(A) 3.2(L)  Chloride 99 - 108 109(A) 105 103  CO2 13 - 22 27(A) 26(A) 28  Calcium 8.7 - 10.7 9.2 8.7 9.7  Total Protein 6.5 - 8.1 g/dL - - -  Total Bilirubin 0.3 - 1.2 mg/dL - - -  Alkaline Phos 25 - 125 75 65 -  AST 13 - 35 28 33 -  ALT 7 - 35 8 10 -    Ref. Range 04/03/2021 15:35 04/03/2021 16:06  Iron Latest Ref Range: 28 - 170 ug/dL 27 (L)   UIBC Latest Units: ug/dL 167   TIBC Latest Ref Range: 250 - 450 ug/dL 194 (L)   Saturation Ratios Latest Ref Range: 10.4 - 31.8 % 14   Ferritin Latest Ref Range: 11 - 307 ng/mL 194   Folate Latest Ref Range:  >5.9 ng/mL  10.0  Vitamin B12 Latest Ref Range: 180 - 914 pg/mL  248   ASSESSMENT & PLAN:  Assessment/Plan:  A 60 y.o. female with new-onset anemia.  Based upon her recent labs, this patient appears to have a hemolytic process taking place.  This is based upon her having an elevated LDH and undetectable haptoglobin.  This patient has 2 mechanical valves for which I fear may be shearing her red cells as they pass by them.  I did discuss her case with Dr Tamala Julian of cardiology, who will arrange for her to undergo an echocardiogram to better evaluate the functional status of her mechanical aortic and mitral valves.  I will defer all valvular management decisions to cardiology/cardiothoracic surgery.  I will tentatively see her back in 1 month for repeat clinical assessment.  She knows to contact us before then if she feels weaker to where her CBC may need to be checked sooner.  The patient and her sister understand all the plans discussed today and are in agreement with them.  I, Rita Ohara, am acting as scribe for Marice Potter, MD    I have reviewed this report as typed by the medical scribe, and it is complete and accurate.  Dequincy Macarthur Critchley, MD
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
## 2021-05-08 ENCOUNTER — Other Ambulatory Visit: Payer: Self-pay

## 2021-05-08 ENCOUNTER — Ambulatory Visit (INDEPENDENT_AMBULATORY_CARE_PROVIDER_SITE_OTHER): Payer: Medicare Other

## 2021-05-08 ENCOUNTER — Ambulatory Visit (INDEPENDENT_AMBULATORY_CARE_PROVIDER_SITE_OTHER): Payer: Medicare Other | Admitting: Interventional Cardiology

## 2021-05-08 ENCOUNTER — Encounter: Payer: Self-pay | Admitting: Interventional Cardiology

## 2021-05-08 VITALS — BP 124/88 | HR 64 | Ht 66.0 in | Wt 272.4 lb

## 2021-05-08 DIAGNOSIS — Z5181 Encounter for therapeutic drug level monitoring: Secondary | ICD-10-CM

## 2021-05-08 DIAGNOSIS — Z7901 Long term (current) use of anticoagulants: Secondary | ICD-10-CM | POA: Diagnosis not present

## 2021-05-08 DIAGNOSIS — Z952 Presence of prosthetic heart valve: Secondary | ICD-10-CM

## 2021-05-08 DIAGNOSIS — I5032 Chronic diastolic (congestive) heart failure: Secondary | ICD-10-CM

## 2021-05-08 DIAGNOSIS — I480 Paroxysmal atrial fibrillation: Secondary | ICD-10-CM

## 2021-05-08 DIAGNOSIS — I10 Essential (primary) hypertension: Secondary | ICD-10-CM

## 2021-05-08 DIAGNOSIS — T82897A Other specified complication of cardiac prosthetic devices, implants and grafts, initial encounter: Secondary | ICD-10-CM

## 2021-05-08 DIAGNOSIS — N183 Chronic kidney disease, stage 3 unspecified: Secondary | ICD-10-CM

## 2021-05-08 LAB — BASIC METABOLIC PANEL
BUN/Creatinine Ratio: 16 (ref 12–28)
BUN: 13 mg/dL (ref 8–27)
CO2: 26 mmol/L (ref 20–29)
Calcium: 9.4 mg/dL (ref 8.7–10.3)
Chloride: 104 mmol/L (ref 96–106)
Creatinine, Ser: 0.79 mg/dL (ref 0.57–1.00)
Glucose: 88 mg/dL (ref 70–99)
Potassium: 3.4 mmol/L — ABNORMAL LOW (ref 3.5–5.2)
Sodium: 142 mmol/L (ref 134–144)
eGFR: 85 mL/min/{1.73_m2} (ref >59)

## 2021-05-08 LAB — CBC
Hematocrit: 33.3 % — ABNORMAL LOW (ref 34.0–46.6)
Hemoglobin: 10.4 g/dL — ABNORMAL LOW (ref 11.1–15.9)
MCH: 28.7 pg (ref 26.6–33.0)
MCHC: 31.2 g/dL — ABNORMAL LOW (ref 31.5–35.7)
MCV: 92 fL (ref 79–97)
Platelets: 204 10*3/uL (ref 150–450)
RBC: 3.62 x10E6/uL — ABNORMAL LOW (ref 3.77–5.28)
RDW: 15.1 % (ref 11.7–15.4)
WBC: 6.5 10*3/uL (ref 3.4–10.8)

## 2021-05-08 LAB — POCT INR: INR: 4.2 — AB (ref 2.0–3.0)

## 2021-05-08 NOTE — Patient Instructions (Signed)
Description   Hold today's dose and then START taking 1/2  tablet every day except 1 tablet on Mondays and Fridays.   Recheck INR in 3 weeks. [facility] # [phone].

## 2021-05-08 NOTE — Patient Instructions (Addendum)
Medication Instructions:  Your physician recommends that you continue on your current medications as directed. Please refer to the Current Medication list given to you today.  *If you need a refill on your cardiac medications before your next appointment, please call your pharmacy*   Lab Work: BMET and CBC today  If you have labs (blood work) drawn today and your tests are completely normal, you will receive your results only by: [place] [facility] (if you have MyChart) OR A paper copy in the mail If you have any lab test that is abnormal or we need to change your treatment, we will call you to review the results.   Testing/Procedures: Your physician has requested that you have a TEE. During a TEE, sound waves are used to create images of your heart. It provides your doctor with information about the size and shape of your heart and how well your heart's chambers and valves are working. In this test, a transducer is attached to the end of a flexible tube that's guided down your throat and into your esophagus (the tube leading from you mouth to your stomach) to get a more detailed image of your heart. You are not awake for the procedure. Please see the instruction sheet given to you today. For further information please visit [email or web address].    Follow-Up: At [facility], you and your health needs are our priority.  As part of our continuing mission to provide you with exceptional heart care, we have created designated Provider Care Teams.  These Care Teams include your primary Cardiologist (physician) and Advanced Practice Providers (APPs -  Physician Assistants and Nurse Practitioners) who all work together to provide you with the care you need, when you need it.  We recommend signing up for the patient portal called "MyChart".  Sign up information is provided on this After Visit Summary.  MyChart is used to connect with patients for Virtual Visits (Telemedicine).  Patients are able to  view lab/test results, encounter notes, upcoming appointments, etc.  Non-urgent messages can be sent to your provider as well.   To learn more about what you can do with MyChart, go to [email or web address].    Your next appointment:   After TEE  The format for your next appointment:   In Person  Provider:   Sinclair Grooms, MD     Other Instructions  TEE INSTRUCTIONS: You are scheduled for a TEE on Tuesday, December, 13, 2022 with Dr. Harrell Gave.  Please arrive at the [facility] (Main Entrance A) at [facility]: [address] [place], [place] [address] at 11:45am am.  You are allowed ONE visitor in the waiting room during your procedure. Both you and your guest must wear masks.  DIET: Nothing to eat or drink after midnight except a sip of water with medications.  MEDICATION INSTRUCTIONS: 1) Hold Furosemide and Potassium the morning of your procedure. 2) You may take your other medications as directed with sips of water.  LABS:  you will have labs drawn today  You must have a responsible person to drive you home and stay in the waiting area during your procedure. Failure to do so could result in cancellation.  Bring your insurance cards.  *Special Note: Every effort is made to have your procedure done on time. Occasionally there are emergencies that occur at the hospital that may cause delays. Please be patient if a delay does occur.

## 2021-05-11 ENCOUNTER — Other Ambulatory Visit: Payer: Self-pay | Admitting: Interventional Cardiology

## 2021-05-12 ENCOUNTER — Ambulatory Visit (HOSPITAL_COMMUNITY)
Admission: RE | Admit: 2021-05-12 | Discharge: 2021-05-12 | Disposition: A | Discharge status: Home / Self Care | Payer: Medicare Other | Source: Ambulatory Visit | Attending: Interventional Cardiology | Admitting: Interventional Cardiology

## 2021-05-12 ENCOUNTER — Encounter (HOSPITAL_COMMUNITY): Payer: Self-pay | Admitting: General Practice

## 2021-05-12 ENCOUNTER — Encounter (HOSPITAL_COMMUNITY)
Admission: RE | Disposition: A | Discharge status: Home / Self Care | Payer: Self-pay | Source: Home / Self Care | Attending: Internal Medicine

## 2021-05-12 ENCOUNTER — Ambulatory Visit (HOSPITAL_COMMUNITY): Payer: Medicare Other

## 2021-05-12 ENCOUNTER — Other Ambulatory Visit: Payer: Self-pay

## 2021-05-12 ENCOUNTER — Inpatient Hospital Stay (HOSPITAL_COMMUNITY)
Admission: RE | Admit: 2021-05-12 | Discharge: 2021-05-19 | DRG: 286 | Disposition: A | Discharge status: Home / Self Care | Payer: Medicare Other | Attending: Internal Medicine | Admitting: Internal Medicine

## 2021-05-12 ENCOUNTER — Other Ambulatory Visit: Payer: Self-pay | Admitting: *Deleted

## 2021-05-12 ENCOUNTER — Encounter (HOSPITAL_COMMUNITY): Payer: Self-pay | Admitting: Cardiology

## 2021-05-12 DIAGNOSIS — N1832 Chronic kidney disease, stage 3b: Secondary | ICD-10-CM | POA: Diagnosis present

## 2021-05-12 DIAGNOSIS — E873 Alkalosis: Secondary | ICD-10-CM | POA: Diagnosis present

## 2021-05-12 DIAGNOSIS — E876 Hypokalemia: Secondary | ICD-10-CM | POA: Diagnosis present

## 2021-05-12 DIAGNOSIS — I890 Lymphedema, not elsewhere classified: Secondary | ICD-10-CM | POA: Diagnosis not present

## 2021-05-12 DIAGNOSIS — D599 Acquired hemolytic anemia, unspecified: Secondary | ICD-10-CM | POA: Diagnosis not present

## 2021-05-12 DIAGNOSIS — I5033 Acute on chronic diastolic (congestive) heart failure: Secondary | ICD-10-CM | POA: Diagnosis not present

## 2021-05-12 DIAGNOSIS — Z818 Family history of other mental and behavioral disorders: Secondary | ICD-10-CM

## 2021-05-12 DIAGNOSIS — M1990 Unspecified osteoarthritis, unspecified site: Secondary | ICD-10-CM | POA: Diagnosis present

## 2021-05-12 DIAGNOSIS — Z952 Presence of prosthetic heart valve: Secondary | ICD-10-CM

## 2021-05-12 DIAGNOSIS — J9621 Acute and chronic respiratory failure with hypoxia: Secondary | ICD-10-CM | POA: Diagnosis present

## 2021-05-12 DIAGNOSIS — J449 Chronic obstructive pulmonary disease, unspecified: Secondary | ICD-10-CM | POA: Diagnosis present

## 2021-05-12 DIAGNOSIS — M109 Gout, unspecified: Secondary | ICD-10-CM | POA: Diagnosis present

## 2021-05-12 DIAGNOSIS — Z9884 Bariatric surgery status: Secondary | ICD-10-CM

## 2021-05-12 DIAGNOSIS — Z6841 Body Mass Index (BMI) 40.0 and over, adult: Secondary | ICD-10-CM

## 2021-05-12 DIAGNOSIS — E785 Hyperlipidemia, unspecified: Secondary | ICD-10-CM | POA: Diagnosis present

## 2021-05-12 DIAGNOSIS — Z20822 Contact with and (suspected) exposure to covid-19: Secondary | ICD-10-CM | POA: Diagnosis present

## 2021-05-12 DIAGNOSIS — I10 Essential (primary) hypertension: Secondary | ICD-10-CM | POA: Diagnosis present

## 2021-05-12 DIAGNOSIS — I350 Nonrheumatic aortic (valve) stenosis: Secondary | ICD-10-CM | POA: Diagnosis not present

## 2021-05-12 DIAGNOSIS — R0902 Hypoxemia: Secondary | ICD-10-CM

## 2021-05-12 DIAGNOSIS — I517 Cardiomegaly: Secondary | ICD-10-CM | POA: Diagnosis not present

## 2021-05-12 DIAGNOSIS — N183 Chronic kidney disease, stage 3 unspecified: Secondary | ICD-10-CM | POA: Diagnosis not present

## 2021-05-12 DIAGNOSIS — Z825 Family history of asthma and other chronic lower respiratory diseases: Secondary | ICD-10-CM

## 2021-05-12 DIAGNOSIS — R791 Abnormal coagulation profile: Secondary | ICD-10-CM | POA: Diagnosis present

## 2021-05-12 DIAGNOSIS — D751 Secondary polycythemia: Secondary | ICD-10-CM | POA: Diagnosis present

## 2021-05-12 DIAGNOSIS — Z79899 Other long term (current) drug therapy: Secondary | ICD-10-CM

## 2021-05-12 DIAGNOSIS — I5030 Unspecified diastolic (congestive) heart failure: Secondary | ICD-10-CM

## 2021-05-12 DIAGNOSIS — I272 Pulmonary hypertension, unspecified: Secondary | ICD-10-CM | POA: Diagnosis present

## 2021-05-12 DIAGNOSIS — S31104D Unspecified open wound of abdominal wall, left lower quadrant without penetration into peritoneal cavity, subsequent encounter: Secondary | ICD-10-CM

## 2021-05-12 DIAGNOSIS — M868X5 Other osteomyelitis, thigh: Secondary | ICD-10-CM | POA: Diagnosis present

## 2021-05-12 DIAGNOSIS — J849 Interstitial pulmonary disease, unspecified: Secondary | ICD-10-CM | POA: Diagnosis present

## 2021-05-12 DIAGNOSIS — Z7901 Long term (current) use of anticoagulants: Secondary | ICD-10-CM

## 2021-05-12 DIAGNOSIS — I130 Hypertensive heart and chronic kidney disease with heart failure and stage 1 through stage 4 chronic kidney disease, or unspecified chronic kidney disease: Secondary | ICD-10-CM | POA: Diagnosis present

## 2021-05-12 DIAGNOSIS — Z87891 Personal history of nicotine dependence: Secondary | ICD-10-CM

## 2021-05-12 DIAGNOSIS — D589 Hereditary hemolytic anemia, unspecified: Secondary | ICD-10-CM | POA: Diagnosis present

## 2021-05-12 DIAGNOSIS — T82897A Other specified complication of cardiac prosthetic devices, implants and grafts, initial encounter: Secondary | ICD-10-CM

## 2021-05-12 DIAGNOSIS — Z9981 Dependence on supplemental oxygen: Secondary | ICD-10-CM | POA: Diagnosis not present

## 2021-05-12 DIAGNOSIS — Z841 Family history of disorders of kidney and ureter: Secondary | ICD-10-CM

## 2021-05-12 DIAGNOSIS — R918 Other nonspecific abnormal finding of lung field: Secondary | ICD-10-CM | POA: Diagnosis not present

## 2021-05-12 DIAGNOSIS — N1831 Chronic kidney disease, stage 3a: Secondary | ICD-10-CM | POA: Diagnosis present

## 2021-05-12 DIAGNOSIS — J9811 Atelectasis: Secondary | ICD-10-CM | POA: Diagnosis present

## 2021-05-12 DIAGNOSIS — Z833 Family history of diabetes mellitus: Secondary | ICD-10-CM

## 2021-05-12 DIAGNOSIS — K529 Noninfective gastroenteritis and colitis, unspecified: Secondary | ICD-10-CM | POA: Diagnosis present

## 2021-05-12 DIAGNOSIS — R0602 Shortness of breath: Secondary | ICD-10-CM | POA: Diagnosis not present

## 2021-05-12 DIAGNOSIS — I4430 Unspecified atrioventricular block: Secondary | ICD-10-CM | POA: Diagnosis present

## 2021-05-12 DIAGNOSIS — L97829 Non-pressure chronic ulcer of other part of left lower leg with unspecified severity: Secondary | ICD-10-CM | POA: Diagnosis present

## 2021-05-12 DIAGNOSIS — Z823 Family history of stroke: Secondary | ICD-10-CM

## 2021-05-12 DIAGNOSIS — I480 Paroxysmal atrial fibrillation: Secondary | ICD-10-CM | POA: Diagnosis present

## 2021-05-12 DIAGNOSIS — Z808 Family history of malignant neoplasm of other organs or systems: Secondary | ICD-10-CM

## 2021-05-12 DIAGNOSIS — Z91199 Patient's noncompliance with other medical treatment and regimen due to unspecified reason: Secondary | ICD-10-CM

## 2021-05-12 DIAGNOSIS — Z8249 Family history of ischemic heart disease and other diseases of the circulatory system: Secondary | ICD-10-CM

## 2021-05-12 DIAGNOSIS — I4892 Unspecified atrial flutter: Secondary | ICD-10-CM | POA: Diagnosis present

## 2021-05-12 DIAGNOSIS — F329 Major depressive disorder, single episode, unspecified: Secondary | ICD-10-CM | POA: Diagnosis present

## 2021-05-12 DIAGNOSIS — I5032 Chronic diastolic (congestive) heart failure: Secondary | ICD-10-CM | POA: Diagnosis present

## 2021-05-12 DIAGNOSIS — I080 Rheumatic disorders of both mitral and aortic valves: Secondary | ICD-10-CM | POA: Diagnosis present

## 2021-05-12 DIAGNOSIS — G4733 Obstructive sleep apnea (adult) (pediatric): Secondary | ICD-10-CM | POA: Diagnosis present

## 2021-05-12 DIAGNOSIS — J8410 Pulmonary fibrosis, unspecified: Secondary | ICD-10-CM | POA: Diagnosis present

## 2021-05-12 DIAGNOSIS — E662 Morbid (severe) obesity with alveolar hypoventilation: Secondary | ICD-10-CM | POA: Diagnosis present

## 2021-05-12 DIAGNOSIS — J4489 Other specified chronic obstructive pulmonary disease: Secondary | ICD-10-CM | POA: Diagnosis present

## 2021-05-12 DIAGNOSIS — J90 Pleural effusion, not elsewhere classified: Secondary | ICD-10-CM | POA: Diagnosis not present

## 2021-05-12 DIAGNOSIS — F419 Anxiety disorder, unspecified: Secondary | ICD-10-CM | POA: Diagnosis present

## 2021-05-12 LAB — COMPREHENSIVE METABOLIC PANEL
ALT: 11 U/L (ref 0–44)
AST: 24 U/L (ref 15–41)
Albumin: 3.4 g/dL — ABNORMAL LOW (ref 3.5–5.0)
Alkaline Phosphatase: 49 U/L (ref 38–126)
Anion gap: 5 (ref 5–15)
BUN: 12 mg/dL (ref 8–23)
CO2: 32 mmol/L (ref 22–32)
Calcium: 9.3 mg/dL (ref 8.9–10.3)
Chloride: 104 mmol/L (ref 98–111)
Creatinine, Ser: 0.78 mg/dL (ref 0.44–1.00)
GFR, Estimated: >60 mL/min (ref >60)
Glucose, Bld: 84 mg/dL (ref 70–99)
Potassium: 3.9 mmol/L (ref 3.5–5.1)
Sodium: 141 mmol/L (ref 135–145)
Total Bilirubin: 1.2 mg/dL (ref 0.3–1.2)
Total Protein: 6.7 g/dL (ref 6.5–8.1)

## 2021-05-12 LAB — CBC WITH DIFFERENTIAL/PLATELET
Abs Immature Granulocytes: 0.02 10*3/uL (ref 0.00–0.07)
Basophils Absolute: 0 10*3/uL (ref 0.0–0.1)
Basophils Relative: 0 %
Eosinophils Absolute: 0.3 10*3/uL (ref 0.0–0.5)
Eosinophils Relative: 5 %
HCT: 35.4 % — ABNORMAL LOW (ref 36.0–46.0)
Hemoglobin: 10.7 g/dL — ABNORMAL LOW (ref 12.0–15.0)
Immature Granulocytes: 0 %
Lymphocytes Relative: 12 %
Lymphs Abs: 0.8 10*3/uL (ref 0.7–4.0)
MCH: 30 pg (ref 26.0–34.0)
MCHC: 30.2 g/dL (ref 30.0–36.0)
MCV: 99.2 fL (ref 80.0–100.0)
Monocytes Absolute: 0.6 10*3/uL (ref 0.1–1.0)
Monocytes Relative: 9 %
Neutro Abs: 5.1 10*3/uL (ref 1.7–7.7)
Neutrophils Relative %: 74 %
Platelets: 191 10*3/uL (ref 150–400)
RBC: 3.57 MIL/uL — ABNORMAL LOW (ref 3.87–5.11)
RDW: 18 % — ABNORMAL HIGH (ref 11.5–15.5)
WBC: 6.8 10*3/uL (ref 4.0–10.5)
nRBC: 0 % (ref 0.0–0.2)

## 2021-05-12 LAB — PROTIME-INR
INR: 2.6 — ABNORMAL HIGH (ref 0.8–1.2)
Prothrombin Time: 27.7 seconds — ABNORMAL HIGH (ref 11.4–15.2)

## 2021-05-12 LAB — MAGNESIUM: Magnesium: 1.8 mg/dL (ref 1.7–2.4)

## 2021-05-12 LAB — PROCALCITONIN: Procalcitonin: <0.1 ng/mL

## 2021-05-12 LAB — RESP PANEL BY RT-PCR (FLU A&B, COVID) ARPGX2
Influenza A by PCR: NEGATIVE
Influenza B by PCR: NEGATIVE
SARS Coronavirus 2 by RT PCR: NEGATIVE

## 2021-05-12 LAB — BRAIN NATRIURETIC PEPTIDE: B Natriuretic Peptide: 479.2 pg/mL — ABNORMAL HIGH (ref 0.0–100.0)

## 2021-05-12 SURGERY — CANCELLED PROCEDURE

## 2021-05-12 MED ORDER — WARFARIN SODIUM 2.5 MG PO TABS
3.7500 mg | ORAL_TABLET | Freq: Once | ORAL | Status: AC
  Administered 2021-05-12: 3.75 mg via ORAL
  Filled 2021-05-12 (×2): qty 1

## 2021-05-12 MED ORDER — SODIUM CHLORIDE 0.9 % IV SOLN: 250.0000 mL | INTRAVENOUS | Status: DC | PRN

## 2021-05-12 MED ORDER — WARFARIN - PHARMACIST DOSING INPATIENT: Freq: Every day | Status: DC

## 2021-05-12 MED ORDER — PRAVASTATIN SODIUM 40 MG PO TABS
80.0000 mg | ORAL_TABLET | Freq: Every day | ORAL | Status: DC
  Administered 2021-05-12 – 2021-05-18 (×7): 80 mg via ORAL
  Filled 2021-05-12 (×7): qty 2

## 2021-05-12 MED ORDER — ALLOPURINOL 300 MG PO TABS
300.0000 mg | ORAL_TABLET | Freq: Every day | ORAL | Status: DC
  Administered 2021-05-12 – 2021-05-18 (×7): 300 mg via ORAL
  Filled 2021-05-12 (×7): qty 1

## 2021-05-12 MED ORDER — SODIUM CHLORIDE 0.9% FLUSH: 3.0000 mL | INTRAVENOUS | Status: DC | PRN

## 2021-05-12 MED ORDER — FUROSEMIDE 40 MG PO TABS: 40.0000 mg | ORAL_TABLET | Freq: Every day | ORAL | Status: DC

## 2021-05-12 MED ORDER — MONTELUKAST SODIUM 10 MG PO TABS
10.0000 mg | ORAL_TABLET | Freq: Every day | ORAL | Status: DC
  Administered 2021-05-12 – 2021-05-18 (×7): 10 mg via ORAL
  Filled 2021-05-12 (×7): qty 1

## 2021-05-12 MED ORDER — METOPROLOL TARTRATE 50 MG PO TABS
50.0000 mg | ORAL_TABLET | Freq: Every day | ORAL | Status: DC
  Administered 2021-05-13 – 2021-05-19 (×7): 50 mg via ORAL
  Filled 2021-05-12 (×7): qty 1

## 2021-05-12 MED ORDER — FUROSEMIDE 10 MG/ML IJ SOLN
40.0000 mg | Freq: Once | INTRAMUSCULAR | Status: AC
  Administered 2021-05-12: 40 mg via INTRAVENOUS
  Filled 2021-05-12: qty 4

## 2021-05-12 MED ORDER — SODIUM CHLORIDE 0.9 % IV SOLN: INTRAVENOUS | Status: DC

## 2021-05-12 MED ORDER — POTASSIUM CHLORIDE CRYS ER 20 MEQ PO TBCR
20.0000 meq | EXTENDED_RELEASE_TABLET | Freq: Every day | ORAL | Status: DC
  Filled 2021-05-12: qty 1

## 2021-05-12 MED ORDER — FLUOXETINE HCL 20 MG PO CAPS
20.0000 mg | ORAL_CAPSULE | Freq: Every day | ORAL | Status: DC
  Administered 2021-05-12 – 2021-05-19 (×8): 20 mg via ORAL
  Filled 2021-05-12 (×8): qty 1

## 2021-05-12 MED ORDER — ACETAMINOPHEN 650 MG RE SUPP: 650.0000 mg | Freq: Four times a day (QID) | RECTAL | Status: DC | PRN

## 2021-05-12 MED ORDER — ACETAMINOPHEN 325 MG PO TABS: 650.0000 mg | ORAL_TABLET | Freq: Four times a day (QID) | ORAL | Status: DC | PRN

## 2021-05-12 MED ORDER — LORATADINE 10 MG PO TABS
10.0000 mg | ORAL_TABLET | Freq: Every day | ORAL | Status: DC
  Administered 2021-05-12 – 2021-05-18 (×7): 10 mg via ORAL
  Filled 2021-05-12 (×7): qty 1

## 2021-05-12 MED ORDER — SODIUM CHLORIDE 0.9% FLUSH
3.0000 mL | Freq: Two times a day (BID) | INTRAVENOUS | Status: DC
  Administered 2021-05-12 – 2021-05-19 (×11): 3 mL via INTRAVENOUS

## 2021-05-12 NOTE — Progress Notes (Signed)
[place] for warfarin Indication:  mechanical valve + afib   Assessment: 7 yof with hx of mechanical mitral and aortic valves and afib presenting today for planned TEE to evaluate MV, found to have with acute on chronic respiratory failure with hypoxia and to be admitted. Pharmacy consulted to dose warfarin inpatient. INR resulted therapeutic at 2.6 on admission. CBC stable from previous. No bleed issues reported.  PTA warfarin dose: 7.5mg  on Mon/Fri, 3.75mg  all other days (last dose 12/12 PTA)  Goal of Therapy:  INR 2.5-3.5 Monitor platelets by anticoagulation protocol: Yes   Plan:  Warfarin 3.75mg  PO x 1 today (home dose) Daily INR Monitor CBC, s/sx bleeding   Elicia Lamp, PharmD, BCPS Clinical Pharmacist 05/12/2021 3:31 PM

## 2021-05-12 NOTE — Progress Notes (Addendum)
Patients pulse ox upon arrival was 75%. O2 4l/m applied, p/o increased to 95 %. Attempted to lower oxygen to 2l, pulse ox dropped to 87%. Oxygen then increased at 3l, po at 94 %. Dr aware. Case management notified and are attempting to have oxygen delivered to hospital for ride home. Patient stated that all of her travel oxygen tanks at home are empty

## 2021-05-12 NOTE — Addendum Note (Signed)
Addended by: Loren Racer on: 05/12/2021 04:44 PM   Modules accepted: Orders

## 2021-05-12 NOTE — Discharge Planning (Signed)
RNCM consulted regarding DME provider for home oxygen as pt arrived for surgery without a portable tank.  RNCM contacted Caryl Pina of [employer] to bring portable tank to pt in Endoscopy prior to discharge home today.

## 2021-05-12 NOTE — Progress Notes (Signed)
Belva Crome, MD  Loren Racer, RN Refer back to [name] to see if there is anything that can be done. Suspect mechanical valve s are causing the problem.   Referral placed

## 2021-05-12 NOTE — Progress Notes (Signed)
Case canceled, waiting for oxygen to be delivered for patient to go home

## 2021-05-12 NOTE — Plan of Care (Signed)
  Problem: Activity: Goal: Capacity to carry out activities will improve Outcome: Progressing   Problem: Cardiac: Goal: Ability to achieve and maintain adequate cardiopulmonary perfusion will improve Outcome: Progressing   

## 2021-05-12 NOTE — Consult Note (Signed)
NAME:  [name], MRN:  [id number], DOB:  June 25, 1960, LOS: 0 ADMISSION DATE:  05/12/2021, CONSULTATION DATE:  12/13 REFERRING MD:  Rogers Blocker, CHIEF COMPLAINT:  hypoxia    History of Present Illness:  60 yr old female who presented to ER w/ cc: 8mo of progressive SOB, weakness< LE edema and fatigue w/ low pulse oximetry. Was being seen today 12/13 for planned TEE to eval her MV. During check in was found to have room air pulse ox 75%. CXR showed new effusion of right.   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  PAF  Our notes suggest she should be on oxygen 2l at rest & 3 w/ exertion dating bact to 2019  Chronic LLE lymphedema  Osteomyelitis left  hip  Significant Hospital Events: Including procedures, antibiotic start and stop dates in addition to other pertinent events   12/13 was to be admitted for hypoxia after being evaluated in preop for TEE which was being done to evaluate hemolytic anemia pulse ox in 70s room air  Interim History / Subjective:  Fatigued   Objective   Blood pressure (Abnormal) 197/74, pulse 64, temperature 98.1 F (36.7 C), temperature source Oral, resp. rate 20, height 5\' 6"  (1.676 m), weight 123.4 kg, SpO2 97 %.       No intake or output data in the 24 hours ending 05/12/21 1608 Filed Weights   05/12/21 1220 05/12/21 1550  Weight: 123.6 kg 123.4 kg    Examination: General: obese 60 year old wf resting in bed NAD HENT: NCAT no JVD  Lungs: fine crackles bases no accessory use on 2 lpm Cardiovascular: RRR w/ + click and systolic HM Abdomen: soft not tender  Extremities: chronic RLE lymphedema LLE edema and chronic changes  Neuro: awake, alert no focal def  GU: voids  Resolved Hospital Problem list     Assessment & Plan:  Acute on chronic Hypoxic  respiratory failure Pulmonary hypertension Obesity  Deconditioning  Diastolic HF  Pulmonary edema  Pleural effusion CKD stage III b Hemolytic anemia  Valvular Heart disease s/p mitral and AVR Fatigue  HTN  Physical deconditioning  Prior bariatric surgery  Pulm problem list Acute on chronic Hypoxic respiratory failure. At baseline I think likely 2/2 underlying ILD and deconditioning +/- hypoventilation vs OSA. Acutely likely exacerbated by chronic hypoxia PAH and ultimately decompensated diastolic HF Plan/rec Diuresis  Pulse ox High resolution CT chest when volume status corrected  Walking oximetry prior to dc to determine O2 needs Abg prior to dc PSG at [employer] (right click and "Reselect all SmartList Selections" daily)   Per primary   Labs   CBC: Recent Labs  Lab 05/08/21 1143  WBC 6.5  HGB 10.4*  HCT 33.3*  MCV 92  PLT 099    Basic Metabolic Panel: Recent Labs  Lab 05/08/21 1143  NA 142  K 3.4*  CL 104  CO2 26  GLUCOSE 88  BUN 13  CREATININE 0.79  CALCIUM 9.4   GFR: Estimated Creatinine Clearance: 99 mL/min (by C-G formula based on SCr of 0.79 mg/dL). Recent Labs  Lab 05/08/21 1143  WBC 6.5    Liver Function Tests: No results for input(s): AST, ALT, ALKPHOS, BILITOT, PROT, ALBUMIN in the last 168 hours. No results for input(s): LIPASE, AMYLASE in the last  168 hours. No results for input(s): AMMONIA in the last 168 hours.  ABG    Component Value Date/Time   PHART 7.380 02/05/2018 0903   PCO2ART 52.3 (H) 02/05/2018 0903   PO2ART 65.7 (L) 02/05/2018 0903   HCO3 30.2 (H) 02/05/2018 0903   TCO2 24 12/30/2020 0559   ACIDBASEDEF 1.1 02/03/2018 1938   O2SAT 91.1 02/05/2018 0903     Coagulation Profile: Recent Labs  Lab 05/08/21 1031  INR 4.2*    Cardiac Enzymes: No results for input(s): CKTOTAL, CKMB, CKMBINDEX, TROPONINI in the last 168 hours.  HbA1C: No results found for: HGBA1C  CBG: No results for input(s): GLUCAP  in the last 168 hours.  Review of Systems:   Review of Systems  Constitutional:  Positive for malaise/fatigue. Negative for fever.  HENT: Negative.    Eyes: Negative.   Respiratory:  Positive for shortness of breath.   Cardiovascular:  Positive for leg swelling.  Gastrointestinal: Negative.   Genitourinary: Negative.   Musculoskeletal: Negative.   Skin: Negative.   Neurological: Negative.   Endo/Heme/Allergies: Negative.   Psychiatric/Behavioral: Negative.      Past Medical History:  She,  has a past medical history of Allergic rhinitis, Anxiety, Arthritis, Atrial flutter ([place]), CHF (congestive heart failure) ([place]), CKD (chronic kidney disease), stage III ([place]), Depression, Dyspnea, Gout, Heart murmur, History of blood transfusion (03/2016), HTN (hypertension), Hyperlipidemia, Lymphedema, Mitral and aortic heart valve diseases, unspecified (07/2014), Morbid obesity ([place]), On home oxygen therapy, Polycythemia, and Vitamin D deficiency.   Surgical History:   Past Surgical History:  Procedure Laterality Date   ABDOMINAL SURGERY     AORTIC AND MITRAL VALVE REPLACEMENT  07/2014   s/p AVR with #19 St Jude and s/p MVR with 31mm St. Jude per [name] at [place] Left 02/04/2021   Procedure: APPLICATION OF WOUND VAC;  Surgeon: [name], MD;  Location: [facility];  Service: Orthopedics;  Laterality: Left;   APPLICATION OF WOUND VAC  02/11/2021   Procedure: APPLICATION OF WOUND VAC;  Surgeon: [name], MD;  Location: [facility];  Service: Orthopedics;;   APPLICATION OF WOUND VAC  02/13/2021   Procedure: APPLICATION OF WOUND VAC;  Surgeon: [name], MD;  Location: [facility];  Service: Orthopedics;;   CARDIAC CATHETERIZATION  07/2014   CARDIAC VALVE REPLACEMENT     CARDIOVERSION N/A 09/19/2014   Procedure: CARDIOVERSION;  Surgeon: Sanda Klein, MD;  Location: [facility];  Service: Cardiovascular;  Laterality: N/A;   [place] BONE GRAFT Left 12/26/2020   Procedure: HARVEST ILIAC BONE GRAFT;  Surgeon: Marybelle Killings, MD;  Location: [facility];  Service: Orthopedics;  Laterality: Left;   I & D EXTREMITY Left 02/04/2021   Procedure: [place] Left Hip Ulcer;  Surgeon: [name], MD;  Location: [facility];  Service: Orthopedics;  Laterality: Left;   I & D EXTREMITY Left 02/11/2021   Procedure: REPEAT DEBRIDEMENT LEFT HIP ULCER;  Surgeon: [name], MD;  Location: [facility];  Service: Orthopedics;  Laterality: Left;   I & D EXTREMITY Left 02/13/2021   Procedure: REPEAT DEBRIDEMENT LEFT HIP ULCER;  Surgeon: [name], MD;  Location: [facility];  Service: Orthopedics;  Laterality: Left;   I & D EXTREMITY Left 02/20/2021   Procedure: REPEAT DEBRIDEMENT LEFT HIP;  Surgeon: [name], MD;  Location: [facility];  Service: Orthopedics;  Laterality: Left;  I & D EXTREMITY Left 02/18/2021   Procedure: Repeat Debridement Left Hip Ulcer, apply VAC;  Surgeon: [name], MD;  Location: [facility];  Service: Orthopedics;  Laterality: Left;   LAPAROSCOPIC GASTRIC SLEEVE RESECTION N/A 01/24/2018   Procedure: LAPAROSCOPIC GASTRIC SLEEVE RESECTION WITH UPPER ENDO AND HIATAL HERNIA REPAIR;  Surgeon: Greer Pickerel, MD;  Location: [facility];  Service: General;  Laterality: N/A;   LAPAROSCOPIC GASTRIC SLEEVE RESECTION N/A 02/03/2018   Procedure: DIAGNOSTIC LAPAROSCOPY EVACUATION OF HEMATOMA;  Surgeon: Greer Pickerel, MD;  Location: [facility];  Service: General;  Laterality: N/A;   LUNG BIOPSY Left 10/03/2013   Procedure: Left Lung Biopsy;  Surgeon: Melrose Nakayama, MD;  Location: [facility];  Service: Thoracic;  Laterality: Left;   TONSILLECTOMY     TOOTH EXTRACTION N/A 12/26/2020   Procedure: DENTAL RESTORATION/EXTRACTIONS;  Surgeon: Diona Browner, DMD;  Location: [facility];  Service: Oral Surgery;  Laterality: N/A;   VIDEO ASSISTED THORACOSCOPY Left 10/03/2013   Procedure: Left Video Assited Thoracoscopy;  Surgeon: Melrose Nakayama, MD;  Location: [facility];   Service: Thoracic;  Laterality: Left;   VIDEO BRONCHOSCOPY Bilateral 10/25/2012   Procedure: VIDEO BRONCHOSCOPY WITH FLUORO;  Surgeon: Kathee Delton, MD;  Location[facility];  Service: Cardiopulmonary;  Laterality: Bilateral;     Social History:   reports that she quit smoking about 7 years ago. Her smoking use included cigarettes. She has a 35.00 pack-year smoking history. She has never used smokeless tobacco. She reports that she does not drink alcohol and does not use drugs.   Family History:  Her family history includes Cancer in her mother; Dementia in her mother; Diabetes in her brother; Emphysema in her mother; Heart disease in her father; Hypertension in her brother, father, mother, and sister; Kidney disease in her father; Kidney failure in her father; Stroke in her brother. There is no history of Heart attack.   Allergies No Known Allergies   Home Medications  Prior to Admission medications   Medication Sig Start Date End Date Taking? Authorizing Provider  acetaminophen (TYLENOL) 650 MG CR tablet Take 1,300 mg by mouth daily.   Yes [provider]  allopurinol (ZYLOPRIM) 300 MG tablet Take 300 mg by mouth at bedtime.    Yes [provider]  cetirizine (ZYRTEC) 10 MG tablet Take 10 mg by mouth daily.   Yes [provider]  FLUoxetine (PROZAC) 20 MG capsule Take 20 mg by mouth daily.   Yes [provider]  furosemide (LASIX) 40 MG tablet Take 40 mg by mouth daily. 04/29/21  Yes [provider]  metoprolol tartrate (LOPRESSOR) 50 MG tablet Take 1 tablet (50 mg total) by mouth daily. 03/05/21  Yes [name], MD  montelukast (SINGULAIR) 10 MG tablet Take 10 mg by mouth daily.    Yes [provider]  potassium chloride SA (KLOR-CON) 20 MEQ tablet Take 1 tablet (20 mEq total) by mouth daily. 04/03/21  Yes Dayton Scrape A, NP  pravastatin (PRAVACHOL) 80 MG tablet Take 80 mg by mouth daily.  08/01/12  Yes [provider]   warfarin (COUMADIN) 7.5 MG tablet Take 7.5mg  (1 full tablet) Monday, Wednesday, Friday. Take 3.75mg  (half a tablet) Saturday, Tuesday, Thursday, Sunday. INR check once a week Patient taking differently: Take 3.75-7.5 mg by mouth See admin instructions. Take 7.5mg  (1 full tablet) Monday and Friday. Take 3.75mg  (half a tablet) Saturday, Tuesday, Wednesday, Thursday, Sunday. INR check once a week 01/09/21  Yes Timothy Lasso, MD  OXYGEN Inhale 2 L into the lungs daily as needed (SOB).    [provider]     Critical care time: NA    Erick Colace ACNP-BC [place] Pager # [phone] OR # [phone] if no answer
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
## 2021-05-12 NOTE — Progress Notes (Signed)
Hospitalist in to see patient. Still waiting for bed placement

## 2021-05-12 NOTE — Pre-Procedure Instructions (Signed)
The patient presented for transesophageal echocardiogram to investigate her valve.  Unfortunately on presentation she was hypoxic on room air with oxygen saturations in the 70s.  We initially started the patient on 2 L nasal cannula and did not improve she subsequently required 4 L nasal cannula.  Her TEE has been rescheduled.  We did a bedside chest x-ray which showed evidence of right small pleural effusion with concern for infiltrate. I have discussed with the patient that it would be in her best interest to be admitted and make sure that she does not have any evidence of pneumonia.  I have been able to speak with the hospitalist team the patient will be admitted on her service.

## 2021-05-12 NOTE — Progress Notes (Signed)
Cxr done.

## 2021-05-12 NOTE — Progress Notes (Signed)
Dr Harriet Masson notified of cxr results. Pt to be admitted

## 2021-05-13 DIAGNOSIS — J9621 Acute and chronic respiratory failure with hypoxia: Secondary | ICD-10-CM | POA: Diagnosis not present

## 2021-05-13 DIAGNOSIS — I5033 Acute on chronic diastolic (congestive) heart failure: Secondary | ICD-10-CM

## 2021-05-13 DIAGNOSIS — N183 Chronic kidney disease, stage 3 unspecified: Secondary | ICD-10-CM

## 2021-05-13 LAB — PROCALCITONIN: Procalcitonin: <0.1 ng/mL

## 2021-05-13 LAB — CBC
HCT: 32.7 % — ABNORMAL LOW (ref 36.0–46.0)
Hemoglobin: 10 g/dL — ABNORMAL LOW (ref 12.0–15.0)
MCH: 29.9 pg (ref 26.0–34.0)
MCHC: 30.6 g/dL (ref 30.0–36.0)
MCV: 97.9 fL (ref 80.0–100.0)
Platelets: 182 10*3/uL (ref 150–400)
RBC: 3.34 MIL/uL — ABNORMAL LOW (ref 3.87–5.11)
RDW: 17.9 % — ABNORMAL HIGH (ref 11.5–15.5)
WBC: 7.3 10*3/uL (ref 4.0–10.5)
nRBC: 0 % (ref 0.0–0.2)

## 2021-05-13 LAB — BASIC METABOLIC PANEL
Anion gap: 9 (ref 5–15)
BUN: 10 mg/dL (ref 8–23)
CO2: 29 mmol/L (ref 22–32)
Calcium: 9.1 mg/dL (ref 8.9–10.3)
Chloride: 102 mmol/L (ref 98–111)
Creatinine, Ser: 0.86 mg/dL (ref 0.44–1.00)
GFR, Estimated: >60 mL/min (ref >60)
Glucose, Bld: 87 mg/dL (ref 70–99)
Potassium: 3.1 mmol/L — ABNORMAL LOW (ref 3.5–5.1)
Sodium: 140 mmol/L (ref 135–145)

## 2021-05-13 LAB — PROTIME-INR
INR: 2.9 — ABNORMAL HIGH (ref 0.8–1.2)
Prothrombin Time: 30 seconds — ABNORMAL HIGH (ref 11.4–15.2)

## 2021-05-13 LAB — TSH: TSH: 2.344 u[IU]/mL (ref 0.350–4.500)

## 2021-05-13 MED ORDER — LOPERAMIDE HCL 2 MG PO CAPS
4.0000 mg | ORAL_CAPSULE | Freq: Three times a day (TID) | ORAL | Status: AC
  Administered 2021-05-13 – 2021-05-14 (×3): 4 mg via ORAL
  Filled 2021-05-13 (×3): qty 2

## 2021-05-13 MED ORDER — POTASSIUM CHLORIDE CRYS ER 20 MEQ PO TBCR
40.0000 meq | EXTENDED_RELEASE_TABLET | Freq: Once | ORAL | Status: AC
  Administered 2021-05-13: 10:00:00 40 meq via ORAL
  Filled 2021-05-13: qty 2

## 2021-05-13 MED ORDER — LOPERAMIDE HCL 2 MG PO CAPS: 2.0000 mg | ORAL_CAPSULE | ORAL | Status: DC | PRN

## 2021-05-13 MED ORDER — DAPAGLIFLOZIN PROPANEDIOL 10 MG PO TABS
10.0000 mg | ORAL_TABLET | Freq: Every day | ORAL | Status: DC
  Administered 2021-05-13 – 2021-05-19 (×7): 10 mg via ORAL
  Filled 2021-05-13 (×7): qty 1

## 2021-05-13 MED ORDER — MAGNESIUM SULFATE 2 GM/50ML IV SOLN
2.0000 g | Freq: Once | INTRAVENOUS | Status: AC
  Administered 2021-05-13: 17:00:00 2 g via INTRAVENOUS
  Filled 2021-05-13: qty 50

## 2021-05-13 MED ORDER — FUROSEMIDE 10 MG/ML IJ SOLN
40.0000 mg | Freq: Once | INTRAMUSCULAR | Status: AC
  Administered 2021-05-13: 10:00:00 40 mg via INTRAVENOUS
  Filled 2021-05-13: qty 4

## 2021-05-13 MED ORDER — POTASSIUM CHLORIDE CRYS ER 20 MEQ PO TBCR
40.0000 meq | EXTENDED_RELEASE_TABLET | Freq: Once | ORAL | Status: AC
  Administered 2021-05-13: 17:00:00 40 meq via ORAL
  Filled 2021-05-13: qty 2

## 2021-05-13 MED ORDER — FUROSEMIDE 10 MG/ML IJ SOLN
40.0000 mg | Freq: Once | INTRAMUSCULAR | Status: AC
  Administered 2021-05-13: 17:00:00 40 mg via INTRAVENOUS
  Filled 2021-05-13: qty 4

## 2021-05-13 MED ORDER — WARFARIN SODIUM 2.5 MG PO TABS
3.7500 mg | ORAL_TABLET | Freq: Once | ORAL | Status: AC
  Administered 2021-05-13: 17:00:00 3.75 mg via ORAL
  Filled 2021-05-13: qty 1

## 2021-05-13 MED ORDER — SACCHAROMYCES BOULARDII 250 MG PO CAPS
250.0000 mg | ORAL_CAPSULE | Freq: Two times a day (BID) | ORAL | Status: DC
  Administered 2021-05-13 – 2021-05-19 (×13): 250 mg via ORAL
  Filled 2021-05-13 (×13): qty 1

## 2021-05-13 NOTE — TOC Initial Note (Signed)
Transition of Care [facility]) - Initial/Assessment Note    Patient Details  Name: [name] MRN: [id number] Date of Birth: 06-Feb-1961  Transition of Care [facility]) CM/SW Contact:    Zenon Mayo, RN Phone Number: 05/13/2021, 2:30 PM  Clinical Narrative:                 NCM spoke with patient, she states she lives with her sister and she is very supportive, she has neb machine and home oxygen thru [employer] (2) liters, she has a walker.  She is active with [facility] for [facility], HHPT, will need resumption orders.  She goes to [facility] in [place] on [address]  She has no issues with getting medications.  Patient for TEE today, she is on 4 liters here, and iv lasix, TOC will continue to follow for dc needs.  Expected Discharge Plan: [place] Barriers to Discharge: Continued Medical Work up   Patient Goals and CMS Choice Patient states their goals for this hospitalization and ongoing recovery are:: return home with sister CMS Medicare.gov Compare Post Acute Care list provided to:: Patient Choice offered to / list presented to : Patient  Expected Discharge Plan and Services Expected Discharge Plan: [place]   Discharge Planning Services: CM Consult Post Acute Care Choice: Resumption of Svcs/PTA Provider, Home Health Living arrangements for the past 2 months: Single Family Home                   DME Agency: NA       HH Arranged: RN, PT HH Agency: [place] Date [facility] Agency Contacted: 05/13/21 Time HH Agency Contacted: 32 Representative spoke with at [place]: [place] Arrangements/Services Living arrangements for the past 2 months: [place] with:: Siblings Patient language and need for interpreter reviewed:: Yes Do you feel safe going back to the place where you live?: Yes      Need for Family Participation in Patient Care: Yes (Comment) Care giver support system in place?: Yes  (comment) Current home services: DME (neb machine, walker, home oxygen ([employer] 2 liters)) Criminal Activity/Legal Involvement Pertinent to Current Situation/Hospitalization: No - Comment as needed  Activities of Daily Living Home Assistive Devices/Equipment: Eyeglasses, Oxygen, Walker (specify type) ADL Screening (condition at time of admission) Patient's cognitive ability adequate to safely complete daily activities?: Yes Is the patient deaf or have difficulty hearing?: No Does the patient have difficulty seeing, even when wearing glasses/contacts?: No Does the patient have difficulty concentrating, remembering, or making decisions?: Yes (at times) Patient able to express need for assistance with ADLs?: Yes Does the patient have difficulty dressing or bathing?: Yes Independently performs ADLs?: No Communication: Independent Dressing (OT): Needs assistance Is this a change from baseline?: Pre-admission baseline Grooming: Needs assistance Is this a change from baseline?: Pre-admission baseline Feeding: Independent Bathing: Needs assistance Is this a change from baseline?: Pre-admission baseline Toileting: Independent In/Out Bed: Independent Walks in Home: Independent Does the patient have difficulty walking or climbing stairs?: Yes (SOB) Weakness of Legs: Both Weakness of Arms/Hands: Both  Permission Sought/Granted                  Emotional Assessment   Attitude/Demeanor/Rapport: Engaged Affect (typically observed): Appropriate Orientation: : Oriented to Self, Oriented to Place, Oriented to  Time, Oriented to Situation Alcohol / Substance Use: Not Applicable Psych Involvement: No (comment)  Admission diagnosis:  Acute on chronic respiratory failure with  hypoxia ([place]) [J96.21] Patient Active Problem List   Diagnosis Date Noted   Acute on chronic respiratory failure with hypoxia ([place]) 05/12/2021   Acute on chronic diastolic CHF (congestive heart failure) ([place])  17/49/4496   Hardware complicating wound infection ([place]) 02/04/2021   Acute osteomyelitis, pelvis, left (HCC)    Postoperative hematoma of musculoskeletal structure following musculoskeletal procedure 01/19/2021   Hematoma of abdominal wall 01/03/2021   Hypovolemic shock ([place]) 12/30/2020   History of bariatric surgery 04/18/2018   Abdominal pain 02/02/2018   Hyperkalemia 02/02/2018   Acute kidney injury superimposed on CKD ([place]) 02/02/2018   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[place])    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 02/01/2018   Postoperative anemia due to chronic blood loss on full anticoagulation 02/01/2018   AKI (acute kidney injury) ([place]) 02/01/2018   Acute renal failure superimposed on stage 3 chronic kidney disease ([place]) 01/26/2018   PAF (paroxysmal atrial fibrillation) ([place])    Acute respiratory failure ([place])    Hypoxia    Postprocedural hypotension    Chronic acquired lymphedema - Right lower extremity 01/24/2018   Hx of mechanical aortic valve replacement 01/24/2018   Morbid obesity ([place]) 01/24/2018   Obstructive sleep apnea 02/03/2017   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   HX: long term anticoagulant use 02/11/2016   Chronic respiratory failure ([place]) 08/12/2015   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   Allergic rhinitis 05/02/2015   Symptomatic anemia 04/08/2015   History of mitral valve replacement with mechanical valve 04/08/2015   CKD (chronic kidney disease) stage 3, GFR 30-59 ml/min (HCC) 04/08/2015   Atrial flutter (HCC)    Chronic anticoagulation 09/04/2014   Warfarin anticoagulation 08/14/2014   CHB (complete heart block) ([place]) 08/14/2014   First degree AV block 08/14/2014   Hypercarbia 08/07/2014   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 [place] [address] Phone: [phone] Fax: [phone]     Social Determinants of Health (SDOH) Interventions    Readmission Risk Interventions Readmission Risk Prevention Plan 05/13/2021  Transportation Screening Complete  PCP or Specialist Appt within 3-5 Days Complete  HRI or [place] Complete  Social Work Consult for [place] Planning/Counseling Complete  Palliative Care Screening Not Applicable  Medication Review Press photographer) Complete  Some recent data might be hidden

## 2021-05-13 NOTE — Progress Notes (Addendum)
Progress Note  Patient Name: [name] Date of Encounter: 05/13/2021  [facility] Cardiologist: Belva Crome III, MD   Subjective   After being admitted and receiving oxygen and some diuresis, she admits to feeling better.  Inpatient Medications    Scheduled Meds:  allopurinol  300 mg Oral QHS   FLUoxetine  20 mg Oral Daily   loratadine  10 mg Oral QHS   metoprolol tartrate  50 mg Oral Daily   montelukast  10 mg Oral QHS   potassium chloride SA  20 mEq Oral Daily   pravastatin  80 mg Oral q1800   sodium chloride flush  3 mL Intravenous Q12H   warfarin  3.75 mg Oral ONCE-1600   Warfarin - Pharmacist Dosing Inpatient   Does not apply q1600   Continuous Infusions:  sodium chloride     sodium chloride     PRN Meds: sodium chloride, acetaminophen **OR** acetaminophen, sodium chloride flush   Vital Signs    Vitals:   05/13/21 0026 05/13/21 0459 05/13/21 0723 05/13/21 1138  BP: 132/88 132/81 138/75 118/61  Pulse: 65 66 64 65  Resp: 20 17 18 20   Temp: 98.4 F (36.9 C) 98.2 F (36.8 C) 98.3 F (36.8 C) 98.4 F (36.9 C)  TempSrc: Oral Oral Oral Oral  SpO2: 94% 92% 94% 93%  Weight:  118.6 kg    Height:        Intake/Output Summary (Last 24 hours) at 05/13/2021 1307 Last data filed at 05/13/2021 1141 Gross per 24 hour  Intake 783 ml  Output 2100 ml  Net -1317 ml   Last 3 Weights 05/13/2021 05/12/2021 05/12/2021  Weight (lbs) 261 lb 6.4 oz 272 lb 272 lb 6.4 oz  Weight (kg) 118.57 kg 123.378 kg 123.56 kg      Telemetry    Controlled rate in the 60s- Personally Reviewed  ECG    Atrial flutter with 5-1 AV conduction.  No acute changes.- Personally Reviewed  Physical Exam  Appears stable GEN: No acute distress.   Neck: No JVD Cardiac: RRR, with mechanical valve closure sounds.  Systolic murmur.  No diastolic murmurs.  Respiratory: Diminished breath sounds at the right base. GI: Soft, nontender, non-distended  MS: No edema; No  deformity. Neuro:  Nonfocal  Psych: Normal affect   Labs    High Sensitivity Troponin:  No results for input(s): TROPONINIHS in the last 720 hours.   Chemistry Recent Labs  Lab 05/08/21 1143 05/12/21 1520 05/13/21 0518  NA 142 141 140  K 3.4* 3.9 3.1*  CL 104 104 102  CO2 26 32 29  GLUCOSE 88 84 87  BUN 13 12 10   CREATININE 0.79 0.78 0.86  CALCIUM 9.4 9.3 9.1  MG  --  1.8  --   PROT  --  6.7  --   ALBUMIN  --  3.4*  --   AST  --  24  --   ALT  --  11  --   ALKPHOS  --  49  --   BILITOT  --  1.2  --   GFRNONAA  --  >60 >60  ANIONGAP  --  5 9    Lipids No results for input(s): CHOL, TRIG, HDL, LABVLDL, LDLCALC, CHOLHDL in the last 168 hours.  Hematology Recent Labs  Lab 05/08/21 1143 05/12/21 1520 05/13/21 0518  WBC 6.5 6.8 7.3  RBC 3.62* 3.57* 3.34*  HGB 10.4* 10.7* 10.0*  HCT 33.3* 35.4* 32.7*  MCV 92  99.2 97.9  MCH 28.7 30.0 29.9  MCHC 31.2* 30.2 30.6  RDW 15.1 18.0* 17.9*  PLT 204 191 182   Thyroid  Recent Labs  Lab 05/13/21 0518  TSH 2.344    BNP Recent Labs  Lab 05/12/21 1520  BNP 479.2*    DDimer No results for input(s): DDIMER in the last 168 hours.   Radiology    DG Chest 1 View  Result Date: 05/12/2021 CLINICAL DATA:  Hypoxia. EXAM: CHEST  1 VIEW COMPARISON:  Chest x-ray dated January 04, 2021. FINDINGS: Unchanged mild cardiomegaly status post aortic and mitral valve replacements. New small right pleural effusion with adjacent right basilar opacity. Chronic scarring at the left costophrenic angle with similar postsurgical changes in the left lung. No pneumothorax. No acute osseous abnormality. IMPRESSION: 1. New small right pleural effusion with adjacent right basilar atelectasis versus infiltrate. Electronically Signed   By: Titus Dubin M.D.   On: 05/12/2021 13:29    Cardiac Studies   See recent echo performed in December 2022.  Patient Profile     60 y.o. 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  Was attempting transesophageal echo when noted to be severely hypoxic, chest x-ray revealed pleural effusion, and she was admitted to the hospital for management of hypoxia and new pulmonary findings.  Assessment & Plan    Acute on chronic diastolic heart failure: Add Farxiga or empagliflozin to help with long-term management. Hypoxic respiratory failure: Appreciate pulmonary evaluation Hemolytic anemia with low haptoglobin: Likely related to valves but is not a candidate for repeat surgery.  Hopefully we can keep case with hemolysis by giving iron and even transfusions if needed.  May consider referring back to [facility] to see if they have any other ideas relative to the mechanical valves and their role in hemolysis. Hypokalemia: Potassium needs repletion.        For questions or updates, please contact [place] Please consult [email or web address] for contact info under        Signed, Sinclair Grooms, MD  05/13/2021, 1:07 PM

## 2021-05-13 NOTE — Progress Notes (Signed)
PROGRESS NOTE  LINSEY Young SNK:[id number] DOB: 23-Mar-1961 DOA: 05/12/2021 PCP: [name], FNP  Brief History   [name] is a 60 y.o. 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   [name] was being seen today for a TEE to further evaluate her mitral valve and rule out regurgitation. When [name] came in for procedure [name] was found to be hypoxic to 75% on room air. [name] typically wears 2-2.5L [place], but was requiring 4L to maintain sats. CXR was obtained which showed new small right pleural effusion with adjacent right basilar atelectasis versus infiltrate.  With new requirement for increased oxygen and findings on chest x-ray Dr. Harriet Masson with cardiology asked for direct admission.   [name] tells me [name] has been feeling "weak" and fatigued x 2 months. [name] has also had shortness of breath x 2 months. [name] chronically wears 2-2.5L [place] at home which [name] states [name] put herself on "all the time" about 2 months ago, but prior to this was only used this as needed prior to this. [name] also tells me that if [name] doesn't wear oxygen [name] will typically be in the 70s, that is not abnormal for her.  No doctor has told her to wear oxygen. [name] denies any fever/chills, no chest pain or palpitations, no cough, no abdominal pain, N/V. [name] has had off and on leg swelling. [name] is not sure about weight gain. [name] has variable episodes of diarrhea, none currently. [name] denies any sick contacts.    [name] has had her flu shot this year. Past history of smoking, at least a PPD for 40+ years, no drinking. No environmental exposure.    ED Course: vitals: pulse ox: 75% on RA and placed on 4L [place] with increase  to 95%. Bp: 178/78, HR: 63, RR: 17. On 2L  at home. CXR obtained which showed new right pleural effusion with adjacent right basilar atelectasis vs. Infiltrate.  Dr. Harriet Masson with cardiology called and asked for direct admit.   No labs at time of direct admit. Labs reviewed from 12/9.  Hgb: 10.4 No wbc Potassium: 3.4  Hospital Course: The patient was admitted to a telemetry bed. Cardiology was consulted. The patient has been seen by [name] of cardiology. [name] was diuresed. [name] developed hypokalemia which was supplemented. [name] has added Wilder Glade or empagliflozin. He has recommended supplemental iron and transfusion s if necessary. He also recommends that the patient follow up with [facility] out of concern that the mechanical valves may be causing hemolysis. [name] states that the patient is not a candidate for repeat surgery.Potassium will be repleted.  Consultants  Cardiology  Procedures  None  Antibiotics   Anti-infectives (From admission, onward)    None        Subjective  The patient is resting comfortably. No new complaints.   Objective   Vitals:  Vitals:   05/13/21 1138 05/13/21 1623  BP: 118/61 (!) 154/74  Pulse: 65 65  Resp: 20 18  Temp: 98.4 F (36.9 C) 98.6 F (37 C)  SpO2: 93% 93%    Exam:  Constitutional:  The patient is awake, alert, and oriented x 3. No acute distress. Respiratory:  Positive for 3 word dyspnea with conversation  and accessory muscle use.  Positive for scattered wheezes throughout. No rales or rhonchi No tactile fremitus Cardiovascular:  Regular rate and rhythm No murmurs, ectopy, or gallups. No lateral PMI. No thrills. Abdomen:  Abdomen is soft, non-tender, non-distended No hernias, masses, or organomegaly Normoactive bowel sounds.  Musculoskeletal:  No cyanosis, clubbing, or edema Skin:  No rashes, lesions, ulcers palpation of skin: no induration or nodules Neurologic:  CN 2-12 intact Sensation all 4 extremities  intact Psychiatric:  Mental status Mood, affect appropriate Orientation to person, place, time  judgment and insight appear intact  I have personally reviewed the following:   Today's Data  Vitals  Lab Data  BMP CBC  Micro Data  Blood cultures x 2 - no growth  Imaging  CXR  Cardiology Data  EKG   Scheduled Meds:  allopurinol  300 mg Oral QHS   dapagliflozin propanediol  10 mg Oral Daily   FLUoxetine  20 mg Oral Daily   loperamide  4 mg Oral Q8H   loratadine  10 mg Oral QHS   metoprolol tartrate  50 mg Oral Daily   montelukast  10 mg Oral QHS   potassium chloride SA  20 mEq Oral Daily   pravastatin  80 mg Oral q1800   saccharomyces boulardii  250 mg Oral BID   sodium chloride flush  3 mL Intravenous Q12H   Warfarin - Pharmacist Dosing Inpatient   Does not apply q1600   Continuous Infusions:  sodium chloride     sodium chloride     magnesium sulfate bolus IVPB 2 g (05/13/21 1642)    Principal Problem:   Acute on chronic respiratory failure with hypoxia (HCC) Active Problems:   Chronic asthmatic bronchitis (HCC)   Essential hypertension   Hyperlipidemia   ILD (interstitial lung disease) (HCC)   CKD (chronic kidney disease) stage 3, GFR 30-59 ml/min (HCC)   Obstructive sleep apnea   Chronic acquired lymphedema - Right lower extremity   PAF (paroxysmal atrial fibrillation) (HCC)   Gout   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HCC)   Hemochromatosis   Major depressive disorder, single episode   Acute on chronic diastolic CHF (congestive heart failure) ([place])   LOS: 1 day   A & P  Acute on chronic respiratory failure with hypoxia ([place]) Patient presenting for TEE and found to be hypoxic on room air to 75% and needing 4L [place] CXR with atelectasis vs. Infiltrate. Cardiology cancelled procedure and we were asked for a direct admit -[name] appears to have been on oxygen for some time with 3L with exertion and 2L at rest.  -[name] has ILD/bronchitis with no f/u with pulm recently. ? If  progressing disease -bnp is elevated with increased SOB and leg swelling and could have some acute on chronic CHF contributing -will treat above and wean back to baseline as tolerated    Active Problems:   Acute on chronic diastolic CHF (congestive heart failure) ([place]) -based off findings on clinical exam including bilateral leg edema worse than baseline, findings on CXR: of new small right pleural effusion, no overt edema and elevated BNP suggests acute on chronic diastolic CHF exacerbation -echo done on 05/04/21: EF of 55-60%, normal LVF.diastolic parameters are indeterminate. Severely elevated pulmonary artery systolic pressure  -strict I/O and daily weights -IV diuresis with 40mg  IV x one today and will see how [name] responds. Held oral lasix.  -check magnesium and follow electrolytes -telemetry  -low suspicion for infection. No white count, fever, cough.    PAF (paroxysmal  atrial fibrillation) ([place]) Rate controlled today. Plans for cardioversion later this month  Continue eliquis, metoprolol and telemetry    Mechanical valves (aorta and mitral) Plans for TEE today to see if any issues with valves causing hemolytic anemia, but cancelled due to hypoxia.  -echo 05/04/21     ILD (interstitial lung disease)/Chronic asthmatic bronchitis ([place]) -per OV notes from pulm in 2019 [name] was on 3L oxygen with walking and 2L at rest, sleeping with 2L.  -? If worsening disease, needs high resolution CT scan -briefly discussed with her cardiologist and will get pulmonology to see her and see what they recommend in regards to ILD/CT chest      CKD (chronic kidney disease) stage 3, GFR 30-59 ml/min (HCC) Below baseline, continue to monitor     Essential hypertension Elevated readings, continue home medication (metoprolol) and monitor Afternoon readings, much improved     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HCC) Recently seen by hematology for new onset anemia and w/u reveals concerns for hemolytic anemia from  mechanical valves  TEE cancelled today Hgb stable, improving continue to monitor   Chronic acquired lymphedema - Right lower extremity Appears to be at her baseline     Hemochromatosis 1 abnormal H63D mutation. Stable.      Major depressive disorder, single episode Stable, continue prozac      Gout Stable, continue colchicine     Hyperlipidemia Continue pravachol      Obstructive sleep apnea  States [name] had a sleep study in 2019 and did not need a cpap machine.  There is a sleep study from 2018 in chart Per pulm notes in 2019, wearing 2L [place] at night    Body mass index is 43.9 kg/m.   I have seen and examined this patient myself. I have spent 35 minutes in her evaluation and care.   Level of care: Telemetry Medical DVT prophylaxis:  warfarin  Code Status:  Full - confirmed with patient Family Communication: sister at bedside: donna evans  Disposition Plan:  The patient is from: home             Anticipated d/c is to: home              Requires inpatient hospitalization and believe [name] requires greater than 2 midnight stays due to increased need of oxygen with acute on chronic failure with hypoxia. [name] is at significant risk of worsening, requires constant monitoring, IV medication, imaging and assessment and MDM with specialists.    Charla Criscione, DO [facility] Direct contact: see [email or web address]  7PM-7AM contact night coverage as above 05/13/2021, 6:35 PM  LOS: 1 day

## 2021-05-13 NOTE — H&P (View-Only) (Signed)
Progress Note  Patient Name: [name] Date of Encounter: 05/13/2021  [facility] Cardiologist: Belva Crome III, MD   Subjective   After being admitted and receiving oxygen and some diuresis, she admits to feeling better.  Inpatient Medications    Scheduled Meds:  allopurinol  300 mg Oral QHS   FLUoxetine  20 mg Oral Daily   loratadine  10 mg Oral QHS   metoprolol tartrate  50 mg Oral Daily   montelukast  10 mg Oral QHS   potassium chloride SA  20 mEq Oral Daily   pravastatin  80 mg Oral q1800   sodium chloride flush  3 mL Intravenous Q12H   warfarin  3.75 mg Oral ONCE-1600   Warfarin - Pharmacist Dosing Inpatient   Does not apply q1600   Continuous Infusions:  sodium chloride     sodium chloride     PRN Meds: sodium chloride, acetaminophen **OR** acetaminophen, sodium chloride flush   Vital Signs    Vitals:   05/13/21 0026 05/13/21 0459 05/13/21 0723 05/13/21 1138  BP: 132/88 132/81 138/75 118/61  Pulse: 65 66 64 65  Resp: 20 17 18 20   Temp: 98.4 F (36.9 C) 98.2 F (36.8 C) 98.3 F (36.8 C) 98.4 F (36.9 C)  TempSrc: Oral Oral Oral Oral  SpO2: 94% 92% 94% 93%  Weight:  118.6 kg    Height:        Intake/Output Summary (Last 24 hours) at 05/13/2021 1307 Last data filed at 05/13/2021 1141 Gross per 24 hour  Intake 783 ml  Output 2100 ml  Net -1317 ml   Last 3 Weights 05/13/2021 05/12/2021 05/12/2021  Weight (lbs) 261 lb 6.4 oz 272 lb 272 lb 6.4 oz  Weight (kg) 118.57 kg 123.378 kg 123.56 kg      Telemetry    Controlled rate in the 60s- Personally Reviewed  ECG    Atrial flutter with 5-1 AV conduction.  No acute changes.- Personally Reviewed  Physical Exam  Appears stable GEN: No acute distress.   Neck: No JVD Cardiac: RRR, with mechanical valve closure sounds.  Systolic murmur.  No diastolic murmurs.  Respiratory: Diminished breath sounds at the right base. GI: Soft, nontender, non-distended  MS: No edema; No  deformity. Neuro:  Nonfocal  Psych: Normal affect   Labs    High Sensitivity Troponin:  No results for input(s): TROPONINIHS in the last 720 hours.   Chemistry Recent Labs  Lab 05/08/21 1143 05/12/21 1520 05/13/21 0518  NA 142 141 140  K 3.4* 3.9 3.1*  CL 104 104 102  CO2 26 32 29  GLUCOSE 88 84 87  BUN 13 12 10   CREATININE 0.79 0.78 0.86  CALCIUM 9.4 9.3 9.1  MG  --  1.8  --   PROT  --  6.7  --   ALBUMIN  --  3.4*  --   AST  --  24  --   ALT  --  11  --   ALKPHOS  --  49  --   BILITOT  --  1.2  --   GFRNONAA  --  >60 >60  ANIONGAP  --  5 9    Lipids No results for input(s): CHOL, TRIG, HDL, LABVLDL, LDLCALC, CHOLHDL in the last 168 hours.  Hematology Recent Labs  Lab 05/08/21 1143 05/12/21 1520 05/13/21 0518  WBC 6.5 6.8 7.3  RBC 3.62* 3.57* 3.34*  HGB 10.4* 10.7* 10.0*  HCT 33.3* 35.4* 32.7*  MCV 92  99.2 97.9  MCH 28.7 30.0 29.9  MCHC 31.2* 30.2 30.6  RDW 15.1 18.0* 17.9*  PLT 204 191 182   Thyroid  Recent Labs  Lab 05/13/21 0518  TSH 2.344    BNP Recent Labs  Lab 05/12/21 1520  BNP 479.2*    DDimer No results for input(s): DDIMER in the last 168 hours.   Radiology    DG Chest 1 View  Result Date: 05/12/2021 CLINICAL DATA:  Hypoxia. EXAM: CHEST  1 VIEW COMPARISON:  Chest x-ray dated January 04, 2021. FINDINGS: Unchanged mild cardiomegaly status post aortic and mitral valve replacements. New small right pleural effusion with adjacent right basilar opacity. Chronic scarring at the left costophrenic angle with similar postsurgical changes in the left lung. No pneumothorax. No acute osseous abnormality. IMPRESSION: 1. New small right pleural effusion with adjacent right basilar atelectasis versus infiltrate. Electronically Signed   By: Titus Dubin M.D.   On: 05/12/2021 13:29    Cardiac Studies   See recent echo performed in December 2022.  Patient Profile     60 y.o. 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  Was attempting transesophageal echo when noted to be severely hypoxic, chest x-ray revealed pleural effusion, and she was admitted to the hospital for management of hypoxia and new pulmonary findings.  Assessment & Plan    Acute on chronic diastolic heart failure: Add Farxiga or empagliflozin to help with long-term management. Hypoxic respiratory failure: Appreciate pulmonary evaluation Hemolytic anemia with low haptoglobin: Likely related to valves but is not a candidate for repeat surgery.  Hopefully we can keep case with hemolysis by giving iron and even transfusions if needed.  May consider referring back to [facility] to see if they have any other ideas relative to the mechanical valves and their role in hemolysis. Hypokalemia: Potassium needs repletion.        For questions or updates, please contact [place] Please consult [email or web address] for contact info under        Signed, Sinclair Grooms, MD  05/13/2021, 1:07 PM

## 2021-05-13 NOTE — Progress Notes (Signed)
Mobility Specialist Progress Note: ° ° 05/13/21 1024  °Mobility  °Activity Ambulated in room  °Level of Assistance Modified independent, requires aide device or extra time  °Assistive Device None  °Distance Ambulated (ft) 40 ft  °Mobility Ambulated with assistance in room  °Mobility Response Tolerated well  °Mobility performed by Mobility specialist  °$Mobility charge 1 Mobility  ° °Pt received in bed willing to participate in mobility. No complaints of pain. Left in bed with call bell in reach and all needs met.  ° °[name] °Mobility Specialist °Primary Phone [phone] °Secondary Phone [phone] ° °

## 2021-05-13 NOTE — Consult Note (Signed)
NAME:  [name], MRN:  [id number], DOB:  [birth year], LOS: 1 ADMISSION DATE:  05/12/2021, CONSULTATION DATE:  12/13 REFERRING MD:  Rogers Blocker, CHIEF COMPLAINT:  hypoxia    History of Present Illness:  60 yr old female who presented to ER w/ cc: 42mo of progressive SOB, weakness< LE edema and fatigue w/ low pulse oximetry. Was being seen today 12/13 for planned TEE to eval her MV. During check in was found to have room air pulse ox 75%. CXR showed new effusion of right.   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  PAF  Our notes suggest she should be on oxygen 2l at rest & 3 w/ exertion dating bact to 2019  Chronic LLE lymphedema  Osteomyelitis left  hip  Significant Hospital Events: Including procedures, antibiotic start and stop dates in addition to other pertinent events   12/13 was to be admitted for hypoxia after being evaluated in preop for TEE which was being done to evaluate hemolytic anemia pulse ox in 70s room air  Interim History / Subjective:   Complains of chronic diarrhea. Dyspnea is improved.  Objective   Blood pressure 118/61, pulse 65, temperature 98.4 F (36.9 C), temperature source Oral, resp. rate 20, height 5\' 6"  (1.676 m), weight 118.6 kg, SpO2 93 %.        Intake/Output Summary (Last 24 hours) at 05/13/2021 1438 Last data filed at 05/13/2021 1331 Gross per 24 hour  Intake 963 ml  Output 2100 ml  Net -1137 ml   Filed Weights   05/12/21 1220 05/12/21 1550 05/13/21 0459  Weight: 123.6 kg 123.4 kg 118.6 kg   Examination: General: obese, no acute distress HENT: NCAT no JVD  Lungs: fine crackles bases, no wheezing. Diminished breath sounds overall due to body habitus. 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    She was previously noncompliant with her oxygen therapy which possibly led to progression of disease.  We are currently diuresing patient to achieve a euvolemic state.  We will discuss with cardiology whether a right heart cath is warranted during this admission.    Sleep study from 2018 did not show concerning signs for obstructive sleep apnea but given her body habitus there is concern for obesity hypoventilation syndrome. She will need outpatient in lab sleep study after discharge.    We will obtain high-resolution CT chest scan when her volume status is improved.  I am less concerned for interstitial lung disease as her CT scan of the abdomen from 02/14/2021 showed no interstitial changes of the lower lung fields.  I have added scheduled Imodium over the next 24 hours for history of  chronic diarrhea.  I have also added probiotic.  PCCM will continue to follow.  Best Practice (right click and "Reselect all SmartList Selections" daily)   Per primary   Labs   CBC: Recent Labs  Lab 05/08/21 1143 05/12/21 1520 05/13/21 0518  WBC 6.5 6.8 7.3  NEUTROABS  --  5.1  --   HGB 10.4* 10.7* 10.0*  HCT 33.3* 35.4* 32.7*  MCV 92 99.2 97.9  PLT 204 191 182     Basic Metabolic Panel: Recent Labs  Lab 05/08/21 1143 05/12/21 1520  05/13/21 0518  NA 142 141 140  K 3.4* 3.9 3.1*  CL 104 104 102  CO2 26 32 29  GLUCOSE 88 84 87  BUN 13 12 10   CREATININE 0.79 0.78 0.86  CALCIUM 9.4 9.3 9.1  MG  --  1.8  --     GFR: Estimated Creatinine Clearance: 90 mL/min (by C-G formula based on SCr of 0.86 mg/dL). Recent Labs  Lab 05/08/21 1143 05/12/21 1520 05/13/21 0518  PROCALCITON  --  <0.10 <0.10  WBC 6.5 6.8 7.3     Liver Function Tests: Recent Labs  Lab 05/12/21 1520  AST 24  ALT 11  ALKPHOS 49  BILITOT 1.2  PROT 6.7  ALBUMIN 3.4*   No results for input(s): LIPASE, AMYLASE in the last 168 hours. No results for input(s): AMMONIA in the last 168 hours.  ABG    Component Value Date/Time   PHART 7.380 02/05/2018 0903   PCO2ART 52.3 (H) 02/05/2018 0903   PO2ART 65.7 (L) 02/05/2018 0903   HCO3 30.2 (H) 02/05/2018 0903   TCO2 24 12/30/2020 0559   ACIDBASEDEF 1.1 02/03/2018 1938   O2SAT 91.1 02/05/2018 0903      Coagulation Profile: Recent Labs  Lab 05/08/21 1031 05/12/21 1539 05/13/21 0518  INR 4.2* 2.6* 2.9*     Cardiac Enzymes: No results for input(s): CKTOTAL, CKMB, CKMBINDEX, TROPONINI in the last 168 hours.  HbA1C: No results found for: HGBA1C  CBG: No results for input(s): GLUCAP in the last 168 hours.    Critical care time: NA    Freda Jackson, MD [facility] Office: [phone]   See Amion for personal pager PCCM on call pager [phone] until 7pm. Please call Elink 7p-7a. [phone]

## 2021-05-13 NOTE — Progress Notes (Signed)
°   05/13/21 1220  Clinical Encounter Type  Visited With Patient and family together  Visit Type Initial  Referral From Nurse  Consult/Referral To Chaplain   Chaplain Jorene Guest responded to the consult request for an Advance Directive. Ike Bene provided A.D. education to the patient and her daughter. The patient said she wants her daughter, Coretha Creswell, to be her HCPOA. Chaplain Pattrick Bady  instructed her  to inform her nurse once she had filled out the Advance Directive.  This note was prepared by Jeanine Luz, M.Div..  For questions please contact by phone [phone].

## 2021-05-13 NOTE — Progress Notes (Signed)
ANTICOAGULATION CONSULT NOTE - Follow Up Consult  Pharmacy Consult for warfarin Indication: atrial fibrillation and mechanical valve  No Known Allergies  Patient Measurements: Height: 5\' 6"  (167.6 cm) Weight: 118.6 kg (261 lb 6.4 oz) IBW/kg (Calculated) : 59.3  Vital Signs: Temp: 98.3 F (36.8 C) (12/14 0723) Temp Source: Oral (12/14 0723) BP: 138/75 (12/14 0723) Pulse Rate: 64 (12/14 0723)  Labs: Recent Labs    05/12/21 1520 05/12/21 1539 05/13/21 0518  HGB 10.7*  --  10.0*  HCT 35.4*  --  32.7*  PLT 191  --  182  LABPROT  --  27.7* 30.0*  INR  --  2.6* 2.9*  CREATININE 0.78  --  0.86    Estimated Creatinine Clearance: 90 mL/min (by C-G formula based on SCr of 0.86 mg/dL).   Medications:  Medications Prior to Admission  Medication Sig Dispense Refill Last Dose   acetaminophen (TYLENOL) 650 MG CR tablet Take 1,300 mg by mouth daily.   05/11/2021   allopurinol (ZYLOPRIM) 300 MG tablet Take 300 mg by mouth at bedtime.    Past Week   cetirizine (ZYRTEC) 10 MG tablet Take 10 mg by mouth daily.   05/11/2021   FLUoxetine (PROZAC) 20 MG capsule Take 20 mg by mouth daily.   05/11/2021   furosemide (LASIX) 40 MG tablet Take 40 mg by mouth daily.   05/11/2021   metoprolol tartrate (LOPRESSOR) 50 MG tablet Take 1 tablet (50 mg total) by mouth daily. 30 tablet 0 05/12/2021   montelukast (SINGULAIR) 10 MG tablet Take 10 mg by mouth daily.    05/11/2021   potassium chloride SA (KLOR-CON) 20 MEQ tablet Take 1 tablet (20 mEq total) by mouth daily. 60 tablet 0 05/11/2021   pravastatin (PRAVACHOL) 80 MG tablet Take 80 mg by mouth daily.    05/11/2021   warfarin (COUMADIN) 7.5 MG tablet Take 7.5mg  (1 full tablet) Monday, Wednesday, Friday. Take 3.75mg  (half a tablet) Saturday, Tuesday, Thursday, Sunday. INR check once a week (Patient taking differently: Take 3.75-7.5 mg by mouth See admin instructions. Take 7.5mg  (1 full tablet) Monday and Friday. Take 3.75mg  (half a tablet) Saturday,  Tuesday, Wednesday, Thursday, Sunday. INR check once a week) 90 tablet 1 05/11/2021   OXYGEN Inhale 2 L into the lungs daily as needed (SOB).       Assessment: 60 yo F with hx of mechanical mitral and aortic valves and afib presented 12/13 for planned TEE to evaluate MV, found to have with acute on chronic respiratory failure with hypoxia and to be admitted. Pharmacy consulted to dose warfarin inpatient. INR resulted therapeutic on home regimen. CBC stable from previous. No bleed issues reported.   PTA warfarin dose: 7.5mg  on Mon/Fri, 3.75mg  all other days (last dose 12/12 PTA)  Goal of Therapy:  INR 2.5-3.5 Monitor platelets by anticoagulation protocol: Yes   Plan:  Warfarin 3.75mg  PO x 1 today Continue daily INR Monitor CBC, s/sx of bleeding  [employer], Pharm.D., BCPS Clinical Pharmacist Clinical phone for 05/13/2021 from 7:30-3:00 is [phone].  **Pharmacist phone directory can be found on [email or web address] listed under [place].  05/13/2021 9:11 AM

## 2021-05-14 ENCOUNTER — Encounter (HOSPITAL_COMMUNITY): Payer: Self-pay | Admitting: Cardiovascular Disease

## 2021-05-14 ENCOUNTER — Ambulatory Visit: Payer: Medicare Other | Admitting: Oncology

## 2021-05-14 ENCOUNTER — Ambulatory Visit: Payer: Medicare Other | Admitting: Orthopedic Surgery

## 2021-05-14 ENCOUNTER — Other Ambulatory Visit: Payer: Medicare Other

## 2021-05-14 ENCOUNTER — Encounter (HOSPITAL_COMMUNITY)
Admission: RE | Disposition: A | Discharge status: Home / Self Care | Payer: Self-pay | Source: Home / Self Care | Attending: Internal Medicine

## 2021-05-14 DIAGNOSIS — I272 Pulmonary hypertension, unspecified: Principal | ICD-10-CM

## 2021-05-14 HISTORY — PX: RIGHT HEART CATH: CATH118263

## 2021-05-14 LAB — POCT I-STAT EG7
Acid-Base Excess: 10 mmol/L — ABNORMAL HIGH (ref 0.0–2.0)
Acid-Base Excess: 9 mmol/L — ABNORMAL HIGH (ref 0.0–2.0)
Bicarbonate: 35.9 mmol/L — ABNORMAL HIGH (ref 20.0–28.0)
Bicarbonate: 36.7 mmol/L — ABNORMAL HIGH (ref 20.0–28.0)
Calcium, Ion: 1.21 mmol/L (ref 1.15–1.40)
Calcium, Ion: 1.25 mmol/L (ref 1.15–1.40)
HCT: 31 % — ABNORMAL LOW (ref 36.0–46.0)
HCT: 32 % — ABNORMAL LOW (ref 36.0–46.0)
Hemoglobin: 10.5 g/dL — ABNORMAL LOW (ref 12.0–15.0)
Hemoglobin: 10.9 g/dL — ABNORMAL LOW (ref 12.0–15.0)
O2 Saturation: 55 %
O2 Saturation: 57 %
Potassium: 3.6 mmol/L (ref 3.5–5.1)
Potassium: 3.7 mmol/L (ref 3.5–5.1)
Sodium: 143 mmol/L (ref 135–145)
Sodium: 143 mmol/L (ref 135–145)
TCO2: 38 mmol/L — ABNORMAL HIGH (ref 22–32)
TCO2: 38 mmol/L — ABNORMAL HIGH (ref 22–32)
pCO2, Ven: 58.4 mmHg (ref 44.0–60.0)
pCO2, Ven: 59.9 mmHg (ref 44.0–60.0)
pH, Ven: 7.395 (ref 7.250–7.430)
pH, Ven: 7.397 (ref 7.250–7.430)
pO2, Ven: 30 mmHg — CL (ref 32.0–45.0)
pO2, Ven: 31 mmHg — CL (ref 32.0–45.0)

## 2021-05-14 LAB — CBC
HCT: 32.8 % — ABNORMAL LOW (ref 36.0–46.0)
Hemoglobin: 9.9 g/dL — ABNORMAL LOW (ref 12.0–15.0)
MCH: 29.5 pg (ref 26.0–34.0)
MCHC: 30.2 g/dL (ref 30.0–36.0)
MCV: 97.6 fL (ref 80.0–100.0)
Platelets: 166 10*3/uL (ref 150–400)
RBC: 3.36 MIL/uL — ABNORMAL LOW (ref 3.87–5.11)
RDW: 17.9 % — ABNORMAL HIGH (ref 11.5–15.5)
WBC: 6.6 10*3/uL (ref 4.0–10.5)
nRBC: 0 % (ref 0.0–0.2)

## 2021-05-14 LAB — BASIC METABOLIC PANEL
Anion gap: 7 (ref 5–15)
BUN: 10 mg/dL (ref 8–23)
CO2: 31 mmol/L (ref 22–32)
Calcium: 8.9 mg/dL (ref 8.9–10.3)
Chloride: 100 mmol/L (ref 98–111)
Creatinine, Ser: 0.98 mg/dL (ref 0.44–1.00)
GFR, Estimated: >60 mL/min (ref >60)
Glucose, Bld: 86 mg/dL (ref 70–99)
Potassium: 3.4 mmol/L — ABNORMAL LOW (ref 3.5–5.1)
Sodium: 138 mmol/L (ref 135–145)

## 2021-05-14 LAB — PROTIME-INR
INR: 2.8 — ABNORMAL HIGH (ref 0.8–1.2)
Prothrombin Time: 29.4 seconds — ABNORMAL HIGH (ref 11.4–15.2)

## 2021-05-14 LAB — PROCALCITONIN: Procalcitonin: <0.1 ng/mL

## 2021-05-14 SURGERY — RIGHT HEART CATH

## 2021-05-14 MED ORDER — LIDOCAINE HCL (PF) 1 % IJ SOLN
INTRAMUSCULAR | Status: DC | PRN
  Administered 2021-05-14: 5 mL

## 2021-05-14 MED ORDER — SODIUM CHLORIDE 0.9% FLUSH: 3.0000 mL | INTRAVENOUS | Status: DC | PRN

## 2021-05-14 MED ORDER — HYDRALAZINE HCL 20 MG/ML IJ SOLN: 10.0000 mg | INTRAMUSCULAR | Status: AC | PRN

## 2021-05-14 MED ORDER — ONDANSETRON HCL 4 MG/2ML IJ SOLN: 4.0000 mg | Freq: Four times a day (QID) | INTRAMUSCULAR | Status: DC | PRN

## 2021-05-14 MED ORDER — SODIUM CHLORIDE 0.9 % IV SOLN: INTRAVENOUS | Status: DC

## 2021-05-14 MED ORDER — POTASSIUM CHLORIDE CRYS ER 20 MEQ PO TBCR
20.0000 meq | EXTENDED_RELEASE_TABLET | Freq: Every day | ORAL | Status: DC
  Administered 2021-05-14 – 2021-05-19 (×6): 20 meq via ORAL
  Filled 2021-05-14 (×6): qty 1

## 2021-05-14 MED ORDER — FUROSEMIDE 10 MG/ML IJ SOLN
40.0000 mg | Freq: Two times a day (BID) | INTRAMUSCULAR | Status: DC
  Administered 2021-05-14: 40 mg via INTRAVENOUS
  Filled 2021-05-14: qty 4

## 2021-05-14 MED ORDER — LABETALOL HCL 5 MG/ML IV SOLN: 10.0000 mg | INTRAVENOUS | Status: AC | PRN

## 2021-05-14 MED ORDER — ASPIRIN 81 MG PO CHEW: 81.0000 mg | CHEWABLE_TABLET | ORAL | Status: DC

## 2021-05-14 MED ORDER — MORPHINE SULFATE (PF) 2 MG/ML IV SOLN: 2.0000 mg | INTRAVENOUS | Status: DC | PRN

## 2021-05-14 MED ORDER — SODIUM CHLORIDE 0.9% FLUSH: 3.0000 mL | Freq: Two times a day (BID) | INTRAVENOUS | Status: DC

## 2021-05-14 MED ORDER — POTASSIUM CHLORIDE CRYS ER 20 MEQ PO TBCR
40.0000 meq | EXTENDED_RELEASE_TABLET | Freq: Once | ORAL | Status: AC
  Administered 2021-05-14: 40 meq via ORAL
  Filled 2021-05-14: qty 2

## 2021-05-14 MED ORDER — WARFARIN SODIUM 2.5 MG PO TABS
3.7500 mg | ORAL_TABLET | Freq: Once | ORAL | Status: AC
  Administered 2021-05-14: 3.75 mg via ORAL
  Filled 2021-05-14: qty 1

## 2021-05-14 MED ORDER — FUROSEMIDE 10 MG/ML IJ SOLN: 40.0000 mg | Freq: Two times a day (BID) | INTRAMUSCULAR | Status: DC

## 2021-05-14 MED ORDER — LIDOCAINE HCL (PF) 1 % IJ SOLN
INTRAMUSCULAR | Status: AC
  Filled 2021-05-14: qty 30

## 2021-05-14 MED ORDER — SODIUM CHLORIDE 0.9 % IV SOLN: INTRAVENOUS | Status: AC

## 2021-05-14 MED ORDER — SODIUM CHLORIDE 0.9 % IV SOLN: 250.0000 mL | INTRAVENOUS | Status: DC | PRN

## 2021-05-14 MED ORDER — HEPARIN (PORCINE) IN NACL 2000-0.9 UNIT/L-% IV SOLN
INTRAVENOUS | Status: DC | PRN
  Administered 2021-05-14: 1000 mL

## 2021-05-14 MED ORDER — HEPARIN (PORCINE) IN NACL 2000-0.9 UNIT/L-% IV SOLN
INTRAVENOUS | Status: AC
  Filled 2021-05-14: qty 1000

## 2021-05-14 MED ORDER — ASPIRIN 81 MG PO CHEW
81.0000 mg | CHEWABLE_TABLET | ORAL | Status: AC
  Administered 2021-05-14: 81 mg via ORAL
  Filled 2021-05-14: qty 1

## 2021-05-14 MED ORDER — SODIUM CHLORIDE 0.9% FLUSH
3.0000 mL | Freq: Two times a day (BID) | INTRAVENOUS | Status: DC
  Administered 2021-05-14 – 2021-05-18 (×9): 3 mL via INTRAVENOUS

## 2021-05-14 MED ORDER — ACETAMINOPHEN 325 MG PO TABS
650.0000 mg | ORAL_TABLET | ORAL | Status: DC | PRN
  Administered 2021-05-14 – 2021-05-18 (×4): 650 mg via ORAL
  Filled 2021-05-14 (×4): qty 2

## 2021-05-14 SURGICAL SUPPLY — 4 items
CATH SWAN GANZ 7F STRAIGHT (CATHETERS) ×2 IMPLANT
GLIDESHEATH SLENDER 7FR .021G (SHEATH) ×2 IMPLANT
KIT HEART LEFT (KITS) ×2 IMPLANT
PACK CARDIAC CATHETERIZATION (CUSTOM PROCEDURE TRAY) ×2 IMPLANT

## 2021-05-14 NOTE — Progress Notes (Signed)
NAME:  [name], MRN:  [id number], DOB:  01-25-1961, LOS: 2 ADMISSION DATE:  05/12/2021, CONSULTATION DATE:  12/13 REFERRING MD:  Rogers Blocker, CHIEF COMPLAINT:  Dyspnea   History of Present Illness:  60 y/o female previously followed in the pulmonary clinic presented to [facility] after she was noted to be hypoxemic while waiting for a TEE to evaluate for mitral valve disease.  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  She underwent mitral valve and aortic valve replacement at [facility] and her lung disease stabilized, then after weight loss surgery in 2019 her chronic hypoxemic respiratory failure resolved and she was taken off oxygen.  Around this time she was also told that she no longer had sleep apnea and stopped using CPAP.    In the last few months she has had worsening fatigue and was found to have evidence of hemolysis without evidence of an underlying autoimmune process.  She was referred to cardiology for evaluation of her valvular heart disease.  They referred her for a TEE and while waiting she was noted to be hypoxemic.  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   2014 S/p mitral and aortic valve replacement 2015 Former smoker Asthma OSA Obesity, s/p weight laparoscopic gastric sleeve resection in 4098 complicated by hematoma Interior hematoma  Chronic respiratory failure with hypoxemia  Non-infectious lymphdema Gout Hypertension Hyperlipidemia   Significant Hospital Events: Including procedures, antibiotic start and stop dates in addition to other pertinent events   12/13 admission  Studies: 12/5 TTE > LVEF 11-91%, RV systolic function normal, severely elevated PA pressur, LA mod dilated, mech st jude mitral gradient 45mgHg; mech AV valve with 128mg 12/15 RHC Right atrial pressure-16/21, mean 18  Right  ventricular pressure-91/9 Pulmonary artery pressure-90/41, mean 60 Pulmonary wedge pressure-A-wave 25, V  ave 20, mean 24 Cardiac output-7 L/min with an index of 3.2 L/min/m.  Interim History / Subjective:  Feels about the same Just had RHC  Objective   Blood pressure 125/65, pulse 72, temperature 98.3 F (36.8 C), temperature source Oral, resp. rate 18, height 5' 6"  (1.676 m), weight 116.4 kg, SpO2 92 %.        Intake/Output Summary (Last 24 hours) at 05/14/2021 1312 Last data filed at 05/14/2021 0700 Gross per 24 hour  Intake 843 ml  Output 1000 ml  Net -157 ml   Filed Weights   05/12/21 1550 05/13/21 0459 05/14/21 0316  Weight: 123.4 kg 118.6 kg 116.4 kg    Examination:  General:  Resting comfortably in bed HENT: NCAT OP clear PULM: CTA B, normal effort CV: RRR, no mgr GI: BS+, soft, nontender MSK: normal bulk and tone Neuro: awake, alert, no distress, MAEW   Resolved Hospital Problem list     Assessment & Plan:  Pulmonary hypertension with elevated wedge pressure Acute on chronic hypoxemic respiratory failure Acute heart failure due to aortic stenosis Obstructive sleep apnea 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  Discussion This picture seems similar to her presentation back in 2014-2015 where she had worsening hypoxemia and eventual pulmonary hemosiderosis in the setting of aortic stenosis and mitral regurgitation.    Plan: Agree with diuresis CPAP at night, needs outpatient sleep study again Wean off O2 for O2 saturation > 88% Ambulate as much as possible After 2-3 days of diuresis we can repeat  a HRCT to see if she has evidence of the parenchymal lung disease she experienced back in 2014-2015  Best Practice (right click and "Reselect all SmartList Selections" daily)   Per [facility]  Labs   CBC: Recent Labs  Lab 05/08/21 1143 05/12/21 1520 05/13/21 0518 05/14/21 0446  WBC 6.5 6.8 7.3 6.6   NEUTROABS  --  5.1  --   --   HGB 10.4* 10.7* 10.0* 9.9*  HCT 33.3* 35.4* 32.7* 32.8*  MCV 92 99.2 97.9 97.6  PLT 204 191 182 111    Basic Metabolic Panel: Recent Labs  Lab 05/08/21 1143 05/12/21 1520 05/13/21 0518 05/14/21 0446  NA 142 141 140 138  K 3.4* 3.9 3.1* 3.4*  CL 104 104 102 100  CO2 26 32 29 31  GLUCOSE 88 84 87 86  BUN 13 12 10 10   CREATININE 0.79 0.78 0.86 0.98  CALCIUM 9.4 9.3 9.1 8.9  MG  --  1.8  --   --    GFR: Estimated Creatinine Clearance: 78.1 mL/min (by C-G formula based on SCr of 0.98 mg/dL). Recent Labs  Lab 05/08/21 1143 05/12/21 1520 05/13/21 0518 05/14/21 0446  PROCALCITON  --  <0.10 <0.10 <0.10  WBC 6.5 6.8 7.3 6.6    Liver Function Tests: Recent Labs  Lab 05/12/21 1520  AST 24  ALT 11  ALKPHOS 49  BILITOT 1.2  PROT 6.7  ALBUMIN 3.4*   No results for input(s): LIPASE, AMYLASE in the last 168 hours. No results for input(s): AMMONIA in the last 168 hours.  ABG    Component Value Date/Time   PHART 7.380 02/05/2018 0903   PCO2ART 52.3 (H) 02/05/2018 0903   PO2ART 65.7 (L) 02/05/2018 0903   HCO3 30.2 (H) 02/05/2018 0903   TCO2 24 12/30/2020 0559   ACIDBASEDEF 1.1 02/03/2018 1938   O2SAT 91.1 02/05/2018 0903     Coagulation Profile: Recent Labs  Lab 05/08/21 1031 05/12/21 1539 05/13/21 0518 05/14/21 0446  INR 4.2* 2.6* 2.9* 2.8*    Cardiac Enzymes: No results for input(s): CKTOTAL, CKMB, CKMBINDEX, TROPONINI in the last 168 hours.  HbA1C: No results found for: HGBA1C  CBG: No results for input(s): GLUCAP in the last 168 hours.  Critical care time: n/a  Met with the patient, her sister and her daughter and discussed her case with [name].  > 35 minutes spent today     Roselie Awkward, MD [place] [facility] Pager: [phone] Cell: [phone] After 7:00 pm call Elink  [phone]

## 2021-05-14 NOTE — Progress Notes (Signed)
Progress Note  Patient Name: DAYLAH SAYAVONG Date of Encounter: 05/14/2021  [facility] Cardiologist: Sinclair Grooms, MD   Subjective   Says her breathing is okay.  She feels that sleep apnea is not currently a problem.  Inpatient Medications    Scheduled Meds:  allopurinol  300 mg Oral QHS   dapagliflozin propanediol  10 mg Oral Daily   FLUoxetine  20 mg Oral Daily   loratadine  10 mg Oral QHS   metoprolol tartrate  50 mg Oral Daily   montelukast  10 mg Oral QHS   potassium chloride SA  20 mEq Oral Daily   potassium chloride  40 mEq Oral Once   pravastatin  80 mg Oral q1800   saccharomyces boulardii  250 mg Oral BID   sodium chloride flush  3 mL Intravenous Q12H   sodium chloride flush  3 mL Intravenous Q12H   warfarin  3.75 mg Oral ONCE-1600   Warfarin - Pharmacist Dosing Inpatient   Does not apply q1600   Continuous Infusions:  sodium chloride     sodium chloride 10 mL/hr at 05/14/21 1158   sodium chloride     PRN Meds: sodium chloride, sodium chloride, acetaminophen, hydrALAZINE, labetalol, loperamide, morphine injection, ondansetron (ZOFRAN) IV, sodium chloride flush, sodium chloride flush   Vital Signs    Vitals:   05/14/21 1016 05/14/21 1021 05/14/21 1031 05/14/21 1043  BP: 140/77 (!) 144/82  125/70  Pulse: 79 (!) 0 (!) 0 (!) 52  Resp: (!) 25 (!) 25 (!) 0 18  Temp:    98.3 F (36.8 C)  TempSrc:    Oral  SpO2: (!) 87% (!) 87% (!) 0% 92%  Weight:      Height:        Intake/Output Summary (Last 24 hours) at 05/14/2021 1218 Last data filed at 05/14/2021 0700 Gross per 24 hour  Intake 843 ml  Output 1000 ml  Net -157 ml   Last 3 Weights 05/14/2021 05/13/2021 05/12/2021  Weight (lbs) 256 lb 11.2 oz 261 lb 6.4 oz 272 lb  Weight (kg) 116.438 kg 118.57 kg 123.378 kg      Telemetry    Atrial flutter with controlled ventricular response- Personally Reviewed  ECG    No new data- Personally Reviewed  Physical Exam  Obese GEN: No acute  distress.   Neck: Difficult to see neck veins, probably because they are so elevated. Cardiac: Mechanical valve closure sounds. Respiratory: Clear to auscultation bilaterally. GI: Soft, nontender, non-distended  MS: No edema; No deformity. Neuro:  Nonfocal  Psych: Normal affect   Labs    High Sensitivity Troponin:  No results for input(s): TROPONINIHS in the last 720 hours.   Chemistry Recent Labs  Lab 05/12/21 1520 05/13/21 0518 05/14/21 0446  NA 141 140 138  K 3.9 3.1* 3.4*  CL 104 102 100  CO2 32 29 31  GLUCOSE 84 87 86  BUN 12 10 10   CREATININE 0.78 0.86 0.98  CALCIUM 9.3 9.1 8.9  MG 1.8  --   --   PROT 6.7  --   --   ALBUMIN 3.4*  --   --   AST 24  --   --   ALT 11  --   --   ALKPHOS 49  --   --   BILITOT 1.2  --   --   GFRNONAA >60 >60 >60  ANIONGAP 5 9 7     Lipids No results for input(s): CHOL, TRIG,  HDL, LABVLDL, LDLCALC, CHOLHDL in the last 168 hours.  Hematology Recent Labs  Lab 05/12/21 1520 05/13/21 0518 05/14/21 0446  WBC 6.8 7.3 6.6  RBC 3.57* 3.34* 3.36*  HGB 10.7* 10.0* 9.9*  HCT 35.4* 32.7* 32.8*  MCV 99.2 97.9 97.6  MCH 30.0 29.9 29.5  MCHC 30.2 30.6 30.2  RDW 18.0* 17.9* 17.9*  PLT 191 182 166   Thyroid  Recent Labs  Lab 05/13/21 0518  TSH 2.344    BNP Recent Labs  Lab 05/12/21 1520  BNP 479.2*    DDimer No results for input(s): DDIMER in the last 168 hours.   Radiology    DG Chest 1 View  Result Date: 05/12/2021 CLINICAL DATA:  Hypoxia. EXAM: CHEST  1 VIEW COMPARISON:  Chest x-ray dated January 04, 2021. FINDINGS: Unchanged mild cardiomegaly status post aortic and mitral valve replacements. New small right pleural effusion with adjacent right basilar opacity. Chronic scarring at the left costophrenic angle with similar postsurgical changes in the left lung. No pneumothorax. No acute osseous abnormality. IMPRESSION: 1. New small right pleural effusion with adjacent right basilar atelectasis versus infiltrate. Electronically  Signed   By: Titus Dubin M.D.   On: 05/12/2021 13:29   CARDIAC CATHETERIZATION  Result Date: 05/14/2021 Images from the original result were not included. CIARRA BRADDY is a 60 y.o. female  [id number] LOCATION:  FACILITY: [facility] [place] PHYSICIAN: Quay Burow, M.D. 23-Aug-1960 DATE OF PROCEDURE:  05/14/2021 DATE OF DISCHARGE: Right Heart Cath History obtained from chart review.60 y.o. 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  Was attempting transesophageal echo when noted to be severely hypoxic, chest x-ray revealed pleural effusion, and she was admitted to the hospital for management of hypoxia and new pulmonary findings.  The patient has been diuresed but still remains hypoxic.  2D echo revealed preserved LV function with a normally functioning aortic and mitral valve mechanical prostheses.  She presents now for right heart cath to define her physiology. HEMODYNAMICS:  Right atrial pressure-16/21, mean 18 Right ventricular pressure-91/9 Pulmonary artery pressure-90/41, mean 60 Pulmonary wedge pressure-A-wave 25, V wave 20, mean 24 Cardiac output-7 L/min with an index of 3.2 L/min/m.   Ms. Bunte has severe pulmonary hypertension with elevated wedge pressures as well.  She will need additional diuresis.  Her sats were approximately 88% on 4 L of oxygen.  The right antecubital sheath was removed and pressure held.  The patient left lab in stable condition.  [name], the patient's attending cardiologist, was notified of these results. Quay Burow. MD, [facility] 05/14/2021 10:29 AM     Cardiac Studies   Right heart cath 05/14/2021: HEMODYNAMICS:    Right atrial pressure-16/21, mean 18 Right ventricular pressure-91/9 Pulmonary artery pressure-90/41, mean  60 Pulmonary wedge pressure-A-wave 25, V wave 20, mean 24 Cardiac output-7 L/min with an index of 3.2 L/min/m.  Patient Profile     60 y.o. 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  Was attempting transesophageal echo when noted to be severely hypoxic, chest x-ray revealed pleural effusion, and she was admitted to the hospital for management of hypoxia  and new pulmonary findings.  Right heart cath demonstrated severe pulmonary hypertension with PA systolic pressure 90 mmHg.  Pulmonary wedge pressure 24 mmHg.  Assessment & Plan    Severe pulmonary hypertension, multifactorial related to chronic hypoxia (possible obesity hypoventilation syndrome/obstructive sleep apnea), diastolic heart failure, and interstitial lung disease. Acute on chronic diastolic heart failure: Needs more diuresis.  We will give 3 IV doses of 40 mg then we resume usual home dose. Interstitial lung disease: Pulmonary hemosiderosis developed after valve replacement in speaking with Dr. [place] [name] who used to be her pulmonologist after Dr. Normajean Baxter left. 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  Hemolytic anemia can also be associated with pulmonary hypertension.     For questions or updates, please contact [place] Please consult [email or web address] for contact info under        Signed, Sinclair Grooms, MD  05/14/2021, 12:18 PM

## 2021-05-14 NOTE — Plan of Care (Signed)
°  Problem: Activity: Goal: Capacity to carry out activities will improve Outcome: Progressing   Problem: Cardiac: Goal: Ability to achieve and maintain adequate cardiopulmonary perfusion will improve Outcome: Progressing   Problem: Education: Goal: Knowledge of General Education information will improve Description: Including pain rating scale, medication(s)/side effects and non-pharmacologic comfort measures Outcome: Progressing   Problem: Health Behavior/Discharge Planning: Goal: Ability to manage health-related needs will improve Outcome: Progressing   

## 2021-05-14 NOTE — Progress Notes (Signed)
Pt refused cpap. Pt stated that she does not wear CPAP at home.

## 2021-05-14 NOTE — Interval H&P Note (Signed)
History and Physical Interval Note:  05/14/2021 10:03 AM  [name]  has presented today for surgery, with the diagnosis of pulmonary HTN.  The various methods of treatment have been discussed with the patient and family. After consideration of risks, benefits and other options for treatment, the patient has consented to  Procedure(s): RIGHT HEART CATH (N/A) as a surgical intervention.  The patient's history has been reviewed, patient examined, no change in status, stable for surgery.  I have reviewed the patient's chart and labs.  Questions were answered to the patient's satisfaction.     Quay Burow

## 2021-05-14 NOTE — Consult Note (Signed)
[place] Nurse Consult Note: Patient receiving care in [place] Reason for Consult: Left lower ABD wound Wound type: Surgical sites on the LL abdomin as result of several I & D procedures that have been performed by Dr. Sharol Given. Patient has been going in to see Dr. Sharol Given every 3 weeks for him to check the drainage. Patient had an appt with Dr. Sharol Given yesterday but was not able to go. These drainage sites are very small and there are 3 of them. The one on the lateral side has a fluid filled blister. No tunneling or undermining of any kind noted. The daughter has been changing the dressing daily and has been taking care of these wounds since this all started back in July 2022. Patient has a large pannus and the wounds appear to be dimpling areas under the pannus that are draining sanguinous fluid.  Dressing procedure/placement/frequency: Clean the pannus area with normal saline then apply 4 x 4s and ABD pad and secure with tape. Change daily.   Monitor the wound area(s) for worsening of condition such as: Signs/symptoms of infection, increase in size, development of or worsening of odor, development of pain, or increased pain at the affected locations.   Notify the medical team if any of these develop.  Thank you for the consult. [place] nurse will not follow at this time.   Please re-consult the [place] team if needed.  Cathlean Marseilles Tamala Julian, MSN, RN, [place], Lysle Pearl, [facility] Wound Treatment Associate Pager [phone]

## 2021-05-14 NOTE — Progress Notes (Signed)
PROGRESS NOTE  [name] HBZ:[id number] DOB: [birth year] DOA: 05/12/2021 PCP: Diana Aly, FNP  Brief History   [name] is a 60 y.o. 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   She was being seen today for a TEE to further evaluate her mitral valve and rule out regurgitation. When she came in for procedure she was found to be hypoxic to 75% on room air. She typically wears 2-2.5L [place], but was requiring 4L to maintain sats. CXR was obtained which showed new small right pleural effusion with adjacent right basilar atelectasis versus infiltrate.  With new requirement for increased oxygen and findings on chest x-ray Dr. Harriet Young with cardiology asked for direct admission.   She tells me she has been feeling "weak" and fatigued x 2 months. She has also had shortness of breath x 2 months. She chronically wears 2-2.5L [place] at home which she states she put herself on "all the time" about 2 months ago, but prior to this was only used this as needed prior to this. She also tells me that if she doesn't wear oxygen she will typically be in the 70s, that is not abnormal for her.  No doctor has told her to wear oxygen. She denies any fever/chills, no chest pain or palpitations, no cough, no abdominal pain, N/V. She has had off and on leg swelling. She is not sure about weight gain. She has variable episodes of diarrhea, none currently. She denies any sick contacts.    She has had her flu shot this year. Past history of smoking, at least a PPD for 40+ years, no drinking. No environmental exposure.    ED Course: vitals: pulse ox: 75% on RA and placed on 4L [place] with increase  to 95%. Bp: 178/78, HR: 63, RR: 17. On 2L [place] at home. CXR obtained which showed new right pleural effusion with adjacent right basilar atelectasis vs. Infiltrate.  Dr. Harriet Young with cardiology called and asked for direct admit.   No labs at time of direct admit. Labs reviewed from 12/9.  Hgb: 10.4 No wbc Potassium: 3.4  Hospital Course: The patient was admitted to a telemetry bed. Cardiology was consulted. The patient has been seen by Dr. Tamala Young of cardiology. She was diuresed. She developed hypokalemia which was supplemented. Dr. Tamala Young has added [place] Glade or empagliflozin. He has recommended supplemental iron and transfusion s if necessary. He also recommends that the patient follow up with [facility] out of concern that the mechanical valves may be causing hemolysis. Dr. Tamala Young states that the patient is not a candidate for repeat surgery.Potassium will be repleted.  Consultants  Cardiology  Procedures  None  Antibiotics   Anti-infectives (From admission, onward)    None        Subjective  The patient is resting comfortably. No new complaints.   Objective   Vitals:  Vitals:   05/14/21 1043 05/14/21 1047  BP: 125/70 125/65  Pulse: (!) 52 72  Resp: 18   Temp: 98.3 F (36.8 C)   SpO2: 92% 92%    Exam:  Constitutional:  The patient is awake, alert, and oriented x 3. No acute distress. Respiratory:  Positive for 3 word dyspnea with conversation and accessory muscle  use.  Positive for scattered wheezes throughout. No rales or rhonchi No tactile fremitus Cardiovascular:  Regular rate and rhythm No murmurs, ectopy, or gallups. No lateral PMI. No thrills. Abdomen:  Abdomen is soft, non-tender, non-distended No hernias, masses, or organomegaly Normoactive bowel sounds.  Musculoskeletal:  No cyanosis, clubbing, or edema Skin:  No rashes, lesions, ulcers palpation of skin: no induration or nodules Neurologic:  CN 2-12 intact Sensation all 4 extremities intact Psychiatric:   Mental status Mood, affect appropriate Orientation to person, place, time  judgment and insight appear intact  I have personally reviewed the following:   Today's Data  Vitals  Lab Data  BMP CBC  Micro Data  Blood cultures x 2 - no growth  Imaging  CXR  Cardiology Data  EKG   Scheduled Meds:  allopurinol  300 mg Oral QHS   dapagliflozin propanediol  10 mg Oral Daily   FLUoxetine  20 mg Oral Daily   furosemide  40 mg Intravenous BID   loratadine  10 mg Oral QHS   metoprolol tartrate  50 mg Oral Daily   montelukast  10 mg Oral QHS   potassium chloride SA  20 mEq Oral Daily   pravastatin  80 mg Oral q1800   saccharomyces boulardii  250 mg Oral BID   sodium chloride flush  3 mL Intravenous Q12H   sodium chloride flush  3 mL Intravenous Q12H   Warfarin - Pharmacist Dosing Inpatient   Does not apply q1600   Continuous Infusions:  sodium chloride     sodium chloride      Principal Problem:   Acute on chronic respiratory failure with hypoxia (HCC) Active Problems:   Chronic asthmatic bronchitis (HCC)   Essential hypertension   Hyperlipidemia   ILD (interstitial lung disease) (HCC)   CKD (chronic kidney disease) stage 3, GFR 30-59 ml/min (HCC)   Obstructive sleep apnea   Chronic acquired lymphedema - Right lower extremity   PAF (paroxysmal atrial fibrillation) (HCC)   Gout   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HCC)   Hemochromatosis   Major depressive disorder, single episode   Acute on chronic diastolic CHF (congestive heart failure) ([place])   Pulmonary HTN ([place])   LOS: 2 days   A & P  Acute on chronic respiratory failure with hypoxia ([place]) Patient presenting for TEE and found to be hypoxic on room air to 75% and needing 4L [place] CXR with atelectasis vs. Infiltrate. Cardiology cancelled procedure and we were asked for a direct admit -she appears to have been on oxygen for some time with 3L with exertion and 2L at rest.  -She has ILD/bronchitis with no f/u with pulm recently. ?  If progressing disease -bnp is elevated with increased SOB and leg swelling and could have some acute on chronic CHF contributing -will treat above and wean back to baseline as tolerated    Active Problems:   Acute on chronic diastolic CHF (congestive heart failure) (HCC) Due to aortic stenosis. -based off findings on clinical exam including bilateral leg edema worse than baseline, findings on CXR: of new small right pleural effusion, no overt edema and elevated BNP suggests acute on chronic diastolic CHF exacerbation -echo done on 05/04/21: EF of 55-60%, normal LVF.diastolic parameters are indeterminate. Severely elevated pulmonary artery systolic pressure  -strict I/O and daily weights -Continue IV diuresis with 40mg  IV x one today and will see how she responds. Held oral lasix.  -telemetry  -Cardiology has been consulted.   Pulmonary hypertension with elevated wedge pressure  Noted. Diuresis to reduce preload.   PAF (paroxysmal atrial fibrillation) (HCC) Rate controlled today. Plans for cardioversion later this month  Continue eliquis, metoprolol and telemetry    Mechanical valves (aorta and mitral) Plans for TEE  to see if any issues with valves causing hemolytic anemia, but cancelled due to hypoxia.      ILD (interstitial lung disease)/Chronic asthmatic bronchitis ([place]) -per OV notes from pulm in 2019 she was on 3L oxygen with walking and 2L at rest, sleeping with 2L.  -? If worsening disease, needs high resolution CT scan -briefly discussed with her cardiologist and will get pulmonology to see her and see what they recommend in regards to ILD/CT chest    CKD (chronic kidney disease) stage 3, GFR 30-59 ml/min (HCC) Below baseline, continue to monitor   Essential hypertension Normotensive on home medication (metoprolol) and monitor  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[place]) Recently seen by hematology for new onset anemia and w/u reveals concerns for hemolytic anemia from mechanical valves  TEE  cancelled for now. Hgb stable, improving continue to monitor.   Chronic acquired lymphedema - Right lower extremity Appears to be at her baseline   Pulmonary Hemochromatosis 1 abnormal H63D mutation. Stable. Will need to have follow up HRCT to evaluate after more diuresis.   Major depressive disorder, single episode Stable, continue prozac    Gout Stable, continue colchicine   Hyperlipidemia Continue pravachol    Obstructive sleep apnea  States she had a sleep study in 2019 and did not need a cpap machine. Will need another sleep study as outpatient. There is a sleep study from 2018 in chart Per pulm notes in 2019, wearing 2L [place] at night    Body mass index is 43.9 kg/m.   I have seen and examined this patient myself. I have spent 32 minutes in her evaluation and care.   Level of care: Telemetry Medical DVT prophylaxis:  warfarin  Code Status:  Full - confirmed with patient Family Communication: sister at bedside: donna evans  Disposition Plan:  The patient is from: home             Anticipated d/c is to: home              Requires inpatient hospitalization and believe she requires greater than 2 midnight stays due to increased need of oxygen with acute on chronic failure with hypoxia. She is at significant risk of worsening, requires constant monitoring, IV medication, imaging and assessment and MDM with specialists.    Elnoria Livingston, DO [facility] Direct contact: see [email or web address]  7PM-7AM contact night coverage as above 05/14/2021, 6:24 PM  LOS: 1 day

## 2021-05-14 NOTE — Progress Notes (Signed)
ANTICOAGULATION CONSULT NOTE - Follow Up Consult  Pharmacy Consult for warfarin Indication: atrial fibrillation and mechanical valve  No Known Allergies  Patient Measurements: Height: 5\' 6"  (167.6 cm) Weight: 116.4 kg (256 lb 11.2 oz) IBW/kg (Calculated) : 59.3  Vital Signs: Temp: 98.6 F (37 C) (12/15 0834) Temp Source: Oral (12/15 0834) BP: 125/78 (12/15 0834) Pulse Rate: 71 (12/15 0834)  Labs: Recent Labs    05/12/21 1520 05/12/21 1539 05/13/21 0518 05/14/21 0446  HGB 10.7*  --  10.0* 9.9*  HCT 35.4*  --  32.7* 32.8*  PLT 191  --  182 166  LABPROT  --  27.7* 30.0* 29.4*  INR  --  2.6* 2.9* 2.8*  CREATININE 0.78  --  0.86 0.98     Estimated Creatinine Clearance: 78.1 mL/min (by C-G formula based on SCr of 0.98 mg/dL).   Medications:  Medications Prior to Admission  Medication Sig Dispense Refill Last Dose   acetaminophen (TYLENOL) 650 MG CR tablet Take 1,300 mg by mouth daily.   05/11/2021   allopurinol (ZYLOPRIM) 300 MG tablet Take 300 mg by mouth at bedtime.    Past Week   cetirizine (ZYRTEC) 10 MG tablet Take 10 mg by mouth daily.   05/11/2021   FLUoxetine (PROZAC) 20 MG capsule Take 20 mg by mouth daily.   05/11/2021   furosemide (LASIX) 40 MG tablet Take 40 mg by mouth daily.   05/11/2021   metoprolol tartrate (LOPRESSOR) 50 MG tablet Take 1 tablet (50 mg total) by mouth daily. 30 tablet 0 05/12/2021   montelukast (SINGULAIR) 10 MG tablet Take 10 mg by mouth daily.    05/11/2021   potassium chloride SA (KLOR-CON) 20 MEQ tablet Take 1 tablet (20 mEq total) by mouth daily. 60 tablet 0 05/11/2021   pravastatin (PRAVACHOL) 80 MG tablet Take 80 mg by mouth daily.    05/11/2021   warfarin (COUMADIN) 7.5 MG tablet Take 7.5mg  (1 full tablet) Monday, Wednesday, Friday. Take 3.75mg  (half a tablet) Saturday, Tuesday, Thursday, Sunday. INR check once a week (Patient taking differently: Take 3.75-7.5 mg by mouth See admin instructions. Take 7.5mg  (1 full tablet) Monday and  Friday. Take 3.75mg  (half a tablet) Saturday, Tuesday, Wednesday, Thursday, Sunday. INR check once a week) 90 tablet 1 05/11/2021   OXYGEN Inhale 2 L into the lungs daily as needed (SOB).       Assessment: 60 yo F with hx of mechanical mitral and aortic valves and afib presented 12/13 for planned TEE to evaluate MV, found to have with acute on chronic respiratory failure with hypoxia and to be admitted. Pharmacy consulted to dose warfarin inpatient. INR therapeutic on home regimen.   INR at goal (2.8) continued on home dose of warfarin. Patient currently in cath lab for [place]. No bleeding issues noted overnight, cbc stable.    PTA warfarin dose: 7.5mg  on Mon/Fri, 3.75mg  all other days (last dose 12/12 PTA)  Goal of Therapy:  INR 2.5-3.5 Monitor platelets by anticoagulation protocol: Yes   Plan:  Continue with home dose of Warfarin 3.75mg  PO x 1 today Continue daily INR Monitor CBC, s/sx of bleeding  Erin Hearing PharmD., BCPS Clinical Pharmacist 05/14/2021 10:22 AM

## 2021-05-15 LAB — BLOOD GAS, ARTERIAL
Acid-Base Excess: 9.1 mmol/L — ABNORMAL HIGH (ref 0.0–2.0)
Bicarbonate: 33.8 mmol/L — ABNORMAL HIGH (ref 20.0–28.0)
Drawn by: 519031
FIO2: 36
O2 Saturation: 90.6 %
Patient temperature: 37
pCO2 arterial: 52.6 mmHg — ABNORMAL HIGH (ref 32.0–48.0)
pH, Arterial: 7.424 (ref 7.350–7.450)
pO2, Arterial: 59.1 mmHg — ABNORMAL LOW (ref 83.0–108.0)

## 2021-05-15 LAB — CBC
HCT: 35.3 % — ABNORMAL LOW (ref 36.0–46.0)
Hemoglobin: 10.4 g/dL — ABNORMAL LOW (ref 12.0–15.0)
MCH: 29.3 pg (ref 26.0–34.0)
MCHC: 29.5 g/dL — ABNORMAL LOW (ref 30.0–36.0)
MCV: 99.4 fL (ref 80.0–100.0)
Platelets: 183 10*3/uL (ref 150–400)
RBC: 3.55 MIL/uL — ABNORMAL LOW (ref 3.87–5.11)
RDW: 17.6 % — ABNORMAL HIGH (ref 11.5–15.5)
WBC: 7.2 10*3/uL (ref 4.0–10.5)
nRBC: 0 % (ref 0.0–0.2)

## 2021-05-15 LAB — PROTIME-INR
INR: 2.5 — ABNORMAL HIGH (ref 0.8–1.2)
Prothrombin Time: 26.9 seconds — ABNORMAL HIGH (ref 11.4–15.2)

## 2021-05-15 MED ORDER — FUROSEMIDE 10 MG/ML IJ SOLN
80.0000 mg | Freq: Two times a day (BID) | INTRAMUSCULAR | Status: AC
  Administered 2021-05-15 (×2): 80 mg via INTRAVENOUS
  Filled 2021-05-15 (×2): qty 8

## 2021-05-15 MED ORDER — WARFARIN SODIUM 7.5 MG PO TABS
7.5000 mg | ORAL_TABLET | Freq: Once | ORAL | Status: AC
  Administered 2021-05-15: 7.5 mg via ORAL
  Filled 2021-05-15: qty 1

## 2021-05-15 NOTE — Progress Notes (Signed)
Mobility Specialist Progress Note:   05/15/21 1038  Mobility  Activity Ambulated in hall  Level of Assistance Modified independent, requires aide device or extra time  Assistive Device None  Distance Ambulated (ft) 80 ft  Mobility Ambulated with assistance in hallway  Mobility Response Tolerated well  Mobility performed by Mobility specialist  $Mobility charge 1 Mobility   No complaints of pain. Pt returned to bed with call bell in reach and all needs met.   [facility] Public librarian Phone [phone] Secondary Phone [phone]

## 2021-05-15 NOTE — Progress Notes (Addendum)
Progress Note  Patient Name: [name] Date of Encounter: 05/15/2021  [facility] Cardiologist: Sinclair Grooms, MD   Subjective   Feels about the same.  Not much urine output negative only 250 cc.  Inpatient Medications    Scheduled Meds:  allopurinol  300 mg Oral QHS   dapagliflozin propanediol  10 mg Oral Daily   FLUoxetine  20 mg Oral Daily   furosemide  80 mg Intravenous BID   loratadine  10 mg Oral QHS   metoprolol tartrate  50 mg Oral Daily   montelukast  10 mg Oral QHS   potassium chloride SA  20 mEq Oral Daily   pravastatin  80 mg Oral q1800   saccharomyces boulardii  250 mg Oral BID   sodium chloride flush  3 mL Intravenous Q12H   sodium chloride flush  3 mL Intravenous Q12H   Warfarin - Pharmacist Dosing Inpatient   Does not apply q1600   Continuous Infusions:  sodium chloride     sodium chloride     PRN Meds: sodium chloride, sodium chloride, acetaminophen, loperamide, morphine injection, ondansetron (ZOFRAN) IV, sodium chloride flush, sodium chloride flush   Vital Signs    Vitals:   05/14/21 1945 05/15/21 0206 05/15/21 0315 05/15/21 0735  BP: 116/67  (!) 157/77 138/68  Pulse: 63  63 86  Resp: 17  19 18   Temp: 98 F (36.7 C)  98 F (36.7 C)   TempSrc: Oral     SpO2: 94%  93%   Weight:  116.1 kg    Height:        Intake/Output Summary (Last 24 hours) at 05/15/2021 1042 Last data filed at 05/15/2021 0316 Gross per 24 hour  Intake 723 ml  Output 1000 ml  Net -277 ml   Last 3 Weights 05/15/2021 05/14/2021 05/13/2021  Weight (lbs) 256 lb 256 lb 11.2 oz 261 lb 6.4 oz  Weight (kg) 116.121 kg 116.438 kg 118.57 kg      Telemetry    Atrial flutter with controlled ventricular response- Personally Reviewed  ECG    No new data- Personally Reviewed  Physical Exam  Obese GEN: No acute distress.   Neck: Elevated to angle of the jaw bilaterally.  Persistent right greater than left lower extremity edema Cardiac: Mechanical valve  closure sounds. Respiratory: Clear to auscultation bilaterally. GI: Soft, nontender, non-distended  MS: Bilateral lower extremity edema; No deformity. Neuro:  Nonfocal  Psych: Normal affect   Labs    High Sensitivity Troponin:  No results for input(s): TROPONINIHS in the last 720 hours.   Chemistry Recent Labs  Lab 05/12/21 1520 05/13/21 0518 05/14/21 0446 05/14/21 1017 05/14/21 1018  NA 141 140 138 143 143  K 3.9 3.1* 3.4* 3.6 3.7  CL 104 102 100  --   --   CO2 32 29 31  --   --   GLUCOSE 84 87 86  --   --   BUN 12 10 10   --   --   CREATININE 0.78 0.86 0.98  --   --   CALCIUM 9.3 9.1 8.9  --   --   MG 1.8  --   --   --   --   PROT 6.7  --   --   --   --   ALBUMIN 3.4*  --   --   --   --   AST 24  --   --   --   --  ALT 11  --   --   --   --   ALKPHOS 49  --   --   --   --   BILITOT 1.2  --   --   --   --   GFRNONAA >60 >60 >60  --   --   ANIONGAP 5 9 7   --   --     Lipids No results for input(s): CHOL, TRIG, HDL, LABVLDL, LDLCALC, CHOLHDL in the last 168 hours.  Hematology Recent Labs  Lab 05/13/21 0518 05/14/21 0446 05/14/21 1017 05/14/21 1018 05/15/21 0201  WBC 7.3 6.6  --   --  7.2  RBC 3.34* 3.36*  --   --  3.55*  HGB 10.0* 9.9* 10.5* 10.9* 10.4*  HCT 32.7* 32.8* 31.0* 32.0* 35.3*  MCV 97.9 97.6  --   --  99.4  MCH 29.9 29.5  --   --  29.3  MCHC 30.6 30.2  --   --  29.5*  RDW 17.9* 17.9*  --   --  17.6*  PLT 182 166  --   --  183   Thyroid  Recent Labs  Lab 05/13/21 0518  TSH 2.344    BNP Recent Labs  Lab 05/12/21 1520  BNP 479.2*    DDimer No results for input(s): DDIMER in the last 168 hours.   Radiology    CARDIAC CATHETERIZATION  Result Date: 05/14/2021 Images from the original result were not included. [name] is a 60 y.o. female  [id number] LOCATION:  FACILITY: [place] PHYSICIAN: Quay Burow, M.D. 1961-02-15 DATE OF PROCEDURE:  05/14/2021 DATE OF DISCHARGE: Right Heart Cath History obtained from chart review.60 y.o. 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  Was attempting transesophageal echo when noted to be severely hypoxic, chest x-ray revealed pleural effusion, and she was admitted to the hospital for management of hypoxia and new pulmonary findings.  The patient has been diuresed but still remains hypoxic.  2D echo revealed preserved LV function with a normally functioning aortic and mitral valve mechanical prostheses.  She presents now for right heart cath to define her physiology. HEMODYNAMICS:  Right atrial pressure-16/21, mean 18 Right ventricular pressure-91/9 Pulmonary artery pressure-90/41, mean 60 Pulmonary wedge pressure-A-wave 25, V wave 20, mean 24 Cardiac output-7 L/min with an index of 3.2 L/min/m.   Ms. Lampe has severe pulmonary hypertension with elevated wedge pressures as well.  She will need additional diuresis.  Her sats were approximately 88% on 4 L of oxygen.  The right antecubital sheath was removed and pressure held.  The patient left lab in stable condition.  [name], the patient's attending cardiologist, was notified of these results. Quay Burow. MD, [facility] 05/14/2021 10:29 AM     Cardiac Studies   Right heart cath 05/14/2021: HEMODYNAMICS:    Right atrial pressure-16/21, mean 18 Right ventricular pressure-91/9 Pulmonary artery pressure-90/41, mean 60 Pulmonary wedge pressure-A-wave 25, V wave 20, mean 24 Cardiac output-7 L/min with an index of 3.2 L/min/m.  Patient Profile     60 y.o. 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  Was attempting transesophageal echo when noted to be severely hypoxic, chest x-ray revealed pleural effusion, and she was admitted to the hospital for management of hypoxia and new pulmonary findings.  Right heart cath demonstrated severe pulmonary hypertension with PA systolic pressure 90 mmHg.  Pulmonary wedge pressure 24 mmHg.  Assessment & Plan    Severe pulmonary hypertension, multifactorial related to chronic hypoxia (possible obesity hypoventilation syndrome/obstructive sleep apnea), diastolic heart failure, and interstitial lung disease.  Will need sleep study scheduled as an outpatient. Acute on chronic diastolic heart failure: Will intensify attempts at diuresis by increasing furosemide dose to 80 mg twice daily.  Goal is to diurese enough to lower wedge pressure and improve pulmonary hypertension.  Need to be careful not to overdiuresis and cause.  Lasix increased from 40 mg every 12 to 80 mg every 12 today. Interstitial lung disease: Pulmonary hemosiderosis developed after valve replacement in speaking with Dr. Lake [name] who used to be her pulmonologist after Dr. Gwenette Greet left.  She needs an arterial blood gas. 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  Hemolytic anemia can also be associated with pulmonary hypertension.  She is not a surgical candidate so even if valves are contributing, goal would be to maintain hemoglobin above 9 and manage hemolytic anemia medically.  Plan home once diuresis. On second thought, not sure that transesophageal echo is going to be very helpful because we do not have any definitive therapy for valve induced hemolysis (not a surgical candidate). Will likely have to accept a component of chronic ongoing hemolysis.     For questions or updates, please contact [facility] Please consult [email or web address] for contact info under        Signed, Sinclair Grooms, MD  05/15/2021, 10:42 AM

## 2021-05-15 NOTE — Progress Notes (Signed)
ANTICOAGULATION CONSULT NOTE - Follow Up Consult  Pharmacy Consult for warfarin Indication: atrial fibrillation and mechanical valve  No Known Allergies  Patient Measurements: Height: 5\' 6"  (167.6 cm) Weight: 116.1 kg (256 lb) IBW/kg (Calculated) : 59.3  Vital Signs: Temp: 98 Young (36.7 C) (12/16 0315) BP: 138/68 (12/16 0735) Pulse Rate: 86 (12/16 0735)  Labs: Recent Labs    05/12/21 1520 05/12/21 1539 05/13/21 0518 05/14/21 0446 05/14/21 1017 05/14/21 1018 05/15/21 0201  HGB 10.7*  --  10.0* 9.9* 10.5* 10.9* 10.4*  HCT 35.4*  --  32.7* 32.8* 31.0* 32.0* 35.3*  PLT 191  --  182 166  --   --  183  LABPROT  --    < > 30.0* 29.4*  --   --  26.9*  INR  --    < > 2.9* 2.8*  --   --  2.5*  CREATININE 0.78  --  0.86 0.98  --   --   --    < > = values in this interval not displayed.     Estimated Creatinine Clearance: 78 mL/min (by C-G formula based on SCr of 0.98 mg/dL).   Medications:  Medications Prior to Admission  Medication Sig Dispense Refill Last Dose   acetaminophen (TYLENOL) 650 MG CR tablet Take 1,300 mg by mouth daily.   05/11/2021   allopurinol (ZYLOPRIM) 300 MG tablet Take 300 mg by mouth at bedtime.    Past Week   cetirizine (ZYRTEC) 10 MG tablet Take 10 mg by mouth daily.   05/11/2021   FLUoxetine (PROZAC) 20 MG capsule Take 20 mg by mouth daily.   05/11/2021   furosemide (LASIX) 40 MG tablet Take 40 mg by mouth daily.   05/11/2021   metoprolol tartrate (LOPRESSOR) 50 MG tablet Take 1 tablet (50 mg total) by mouth daily. 30 tablet 0 05/12/2021   montelukast (SINGULAIR) 10 MG tablet Take 10 mg by mouth daily.    05/11/2021   potassium chloride SA (KLOR-CON) 20 MEQ tablet Take 1 tablet (20 mEq total) by mouth daily. 60 tablet 0 05/11/2021   pravastatin (PRAVACHOL) 80 MG tablet Take 80 mg by mouth daily.    05/11/2021   warfarin (COUMADIN) 7.5 MG tablet Take 7.5mg  (1 full tablet) Monday, Wednesday, Friday. Take 3.75mg  (half a tablet) Saturday, Tuesday,  Thursday, Sunday. INR check once a week (Patient taking differently: Take 3.75-7.5 mg by mouth See admin instructions. Take 7.5mg  (1 full tablet) Monday and Friday. Take 3.75mg  (half a tablet) Saturday, Tuesday, Wednesday, Thursday, Sunday. INR check once a week) 90 tablet 1 05/11/2021   OXYGEN Inhale 2 L into the lungs daily as needed (SOB).       Assessment: 60 yo Young with hx of mechanical mitral and aortic valves and afib presented 12/13 for planned TEE to evaluate MV, found to have with acute on chronic respiratory failure with hypoxia and to be admitted. Pharmacy consulted to dose warfarin inpatient. INR therapeutic on home regimen.   INR at goal (2.5) continued on home dose of warfarin. No bleeding issues noted overnight, cbc stable.    PTA warfarin dose: 7.5mg  on Mon/Fri, 3.75mg  all other days (last dose 12/12 PTA)  Goal of Therapy:  INR 2.5-3.5 Monitor platelets by anticoagulation protocol: Yes   Plan:  Continue with home dose of Warfarin 7.5mg  PO x 1 today Continue daily INR Monitor CBC, s/sx of bleeding  Erin Hearing PharmD., BCPS Clinical Pharmacist 05/15/2021 12:23 PM

## 2021-05-15 NOTE — Care Management Important Message (Signed)
Important Message  Patient Details  Name: [name] MRN: [id number] Date of Birth: 07-Dec-1960   Medicare Important Message Given:  Yes     Shelda Altes 05/15/2021, 9:18 AM

## 2021-05-15 NOTE — Progress Notes (Signed)
RT NOTES: ABG obtained and sent to lab. Lab tech notified.  

## 2021-05-15 NOTE — Consult Note (Signed)
° °  [facility] CM Inpatient Consult   05/15/2021  [name] 10-26-1960 [id number]  Medicare  Primary Care Provider:  [name], [place], [place], [facility] Medicine is not in the [employer]  Met with the patient at the bedside.  She endorses [facility] as her primary care and has not been a patient with [facility].  Reviewed for high risk scores for unplanned readmission. Patient denies any needs at this time.      Reason:  Not a beneficiary currently attributed to one of the [place].   Patient's primary care provider is not an in-network provider at this time.  Of note, [facility] Care Management services does not replace or interfere with any services that are arranged by inpatient Transition of Care [TOC] team   For additional questions or referrals please contact:    Natividad Brood, RN BSN [facility] Liaison  [phone] business mobile phone Toll free office [phone]  Fax number: [phone] [email or web address]

## 2021-05-15 NOTE — Progress Notes (Signed)
PROGRESS NOTE  [name] ZOX:[id number] DOB: Feb 21, 1961 DOA: 05/12/2021 PCP: [name], FNP  Brief History   [name] is a 60 y.o. 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   She was being seen today for a TEE to further evaluate her mitral valve and rule out regurgitation. When she came in for procedure she was found to be hypoxic to 75% on room air. She typically wears 2-2.5L [place], but was requiring 4L to maintain sats. CXR was obtained which showed new small right pleural effusion with adjacent right basilar atelectasis versus infiltrate.  With new requirement for increased oxygen and findings on chest x-ray Dr. Harriet Masson with cardiology asked for direct admission.   She tells me she has been feeling "weak" and fatigued x 2 months. She has also had shortness of breath x 2 months. She chronically wears 2-2.5L [place] at home which she states she put herself on "all the time" about 2 months ago, but prior to this was only used this as needed prior to this. She also tells me that if she doesn't wear oxygen she will typically be in the 70s, that is not abnormal for her.  No doctor has told her to wear oxygen. She denies any fever/chills, no chest pain or palpitations, no cough, no abdominal pain, N/V. She has had off and on leg swelling. She is not sure about weight gain. She has variable episodes of diarrhea, none currently. She denies any sick contacts.    She has had her flu shot this year. Past history of smoking, at least a PPD for 40+ years, no drinking. No environmental exposure.    ED Course: vitals: pulse ox: 75% on RA and placed on 4L [place] with increase  to 95%. Bp: 178/78, HR: 63, RR: 17. On 2L  at home. CXR obtained which showed new right pleural effusion with adjacent right basilar atelectasis vs. Infiltrate.  Dr. Harriet Masson with cardiology called and asked for direct admit.   No labs at time of direct admit. Labs reviewed from 12/9.  Hgb: 10.4 No wbc Potassium: 3.4  Hospital Course: The patient was admitted to a telemetry bed. Cardiology was consulted. The patient has been seen by [name] of cardiology. She was diuresed. She developed hypokalemia which was supplemented. [name] has added Wilder Glade or empagliflozin. He has recommended supplemental iron and transfusion s if necessary. He also recommends that the patient follow up with [facility] out of concern that the mechanical valves may be causing hemolysis. [name] states that the patient is not a candidate for repeat surgery.Potassium has been supplemented.  Pulmonology was consulted. Dr. Erin Fulling recommends HRCT when the patient is diuresed optimally.  Consultants  Cardiology Pulmonology  Procedures  None  Antibiotics   Anti-infectives (From admission, onward)    None        Subjective  The patient is resting comfortably. No new complaints.   Objective   Vitals:  Vitals:   05/15/21 0315 05/15/21 0735  BP: (!) 157/77 138/68  Pulse: 63 86  Resp: 19 18  Temp: 98 F (36.7 C) 98.7 F (37.1 C)  SpO2: 93%     Exam:  Constitutional:  The patient is awake, alert, and  oriented x 3. No acute distress. Respiratory:  No increased work of breathing.  No wheezes, rales, or rhonchi No tactile fremitus Cardiovascular:  Regular rate and rhythm No murmurs, ectopy, or gallups. No lateral PMI. No thrills. Abdomen:  Abdomen is soft, non-tender, non-distended No hernias, masses, or organomegaly Normoactive bowel sounds.  Musculoskeletal:  No cyanosis, clubbing, or edema Skin:  No rashes, lesions, ulcers palpation of skin: no induration or nodules Neurologic:  CN 2-12  intact Sensation all 4 extremities intact Psychiatric:  Mental status Mood, affect appropriate Orientation to person, place, time  judgment and insight appear intact  I have personally reviewed the following:   Today's Data  Vitals  Lab Data  BMP CBC  Micro Data  Blood cultures x 2 - no growth  Imaging  CXR  Cardiology Data  EKG   Scheduled Meds:  allopurinol  300 mg Oral QHS   dapagliflozin propanediol  10 mg Oral Daily   FLUoxetine  20 mg Oral Daily   furosemide  80 mg Intravenous BID   loratadine  10 mg Oral QHS   metoprolol tartrate  50 mg Oral Daily   montelukast  10 mg Oral QHS   potassium chloride SA  20 mEq Oral Daily   pravastatin  80 mg Oral q1800   saccharomyces boulardii  250 mg Oral BID   sodium chloride flush  3 mL Intravenous Q12H   sodium chloride flush  3 mL Intravenous Q12H   warfarin  7.5 mg Oral ONCE-1600   Warfarin - Pharmacist Dosing Inpatient   Does not apply q1600   Continuous Infusions:  sodium chloride     sodium chloride      Principal Problem:   Acute on chronic respiratory failure with hypoxia (HCC) Active Problems:   Chronic asthmatic bronchitis (HCC)   Essential hypertension   Hyperlipidemia   ILD (interstitial lung disease) (HCC)   CKD (chronic kidney disease) stage 3, GFR 30-59 ml/min (HCC)   Obstructive sleep apnea   Chronic acquired lymphedema - Right lower extremity   PAF (paroxysmal atrial fibrillation) (HCC)   Gout   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HCC)   Hemochromatosis   Major depressive disorder, single episode   Acute on chronic diastolic CHF (congestive heart failure) (HCC)   Pulmonary HTN (HCC)   LOS: 3 days   A & P  Acute on chronic respiratory failure with hypoxia (HCC) Patient presenting for TEE and found to be hypoxic on room air to 75% and needing 4L [place] CXR with atelectasis vs. Infiltrate. Cardiology cancelled procedure and we were asked for a direct admit -she appears to have been on oxygen for some time with  3L with exertion and 2L at rest.  -She has ILD/bronchitis with no f/u with pulm recently. ? If progressing disease -bnp is elevated with increased SOB and leg swelling and could have some acute on chronic CHF contributing -will treat above and wean back to baseline as tolerated.   Active Problems:   Acute on chronic diastolic CHF (congestive heart failure) (HCC) Due to aortic stenosis. -based off findings on clinical exam including bilateral leg edema worse than baseline, findings on CXR: of new small right pleural effusion, no overt edema and elevated BNP suggests acute on chronic diastolic CHF exacerbation -echo done on 05/04/21: EF of 55-60%, normal LVF.diastolic parameters are indeterminate. Severely elevated pulmonary artery systolic pressure  -strict I/O and daily weights -Continue IV diuresis with 40mg  IV x one today and will see how she responds. Held oral lasix.  -  telemetry  -Cardiology has been consulted.   Pulmonary hypertension with elevated wedge pressure Noted. Diuresis to reduce preload.   PAF (paroxysmal atrial fibrillation) (HCC) Rate controlled today. Plans for cardioversion later this month  Continue eliquis, metoprolol and telemetry    Mechanical valves (aorta and mitral) Plans for TEE  to see if any issues with valves causing hemolytic anemia, but cancelled due to hypoxia.      ILD (interstitial lung disease)/Chronic asthmatic bronchitis ([place]) -per OV notes from pulm in 2019 she was on 3L oxygen with walking and 2L at rest, sleeping with 2L.  -? If worsening disease, needs high resolution CT scan -briefly discussed with her cardiologist Dr. Erin Fulling has recommended HRCT once she is optimally diuresed.   CKD (chronic kidney disease) stage 3, GFR 30-59 ml/min (HCC) Below baseline, continue to monitor   Essential hypertension Normotensive on home medication (metoprolol) and monitor  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HCC) Recently seen by hematology for new onset anemia and w/u  reveals concerns for hemolytic anemia from mechanical valves  TEE cancelled for now. Hgb stable, improving continue to monitor.   Chronic acquired lymphedema - Right lower extremity Appears to be at her baseline   Pulmonary Hemochromatosis 1 abnormal H63D mutation. Stable. Will need to have follow up HRCT to evaluate after more diuresis.   Major depressive disorder, single episode Stable, continue prozac    Gout Stable, continue colchicine   Hyperlipidemia Continue pravachol    Obstructive sleep apnea  States she had a sleep study in 2019 and did not need a cpap machine. Will need another sleep study as outpatient. There is a sleep study from 2018 in chart Per pulm notes in 2019, wearing 2L [place] at night    Body mass index is 43.9 kg/m.   I have seen and examined this patient myself. I have spent 30 minutes in her evaluation and care.   Level of care: Telemetry Medical DVT prophylaxis:  warfarin  Code Status:  Full - confirmed with patient Family Communication: sister at bedside: donna evans  Disposition Plan:  The patient is from: home             Anticipated d/c is to: home              Requires inpatient hospitalization and believe she requires greater than 2 midnight stays due to increased need of oxygen with acute on chronic failure with hypoxia. She is at significant risk of worsening, requires constant monitoring, IV medication, imaging and assessment and MDM with specialists.   Madia Carvell, DO [facility] Direct contact: see [email or web address]  7PM-7AM contact night coverage as above 05/15/2021, 3:25 PM  LOS: 1 day

## 2021-05-15 NOTE — Progress Notes (Addendum)
Patient in bathroom both times I tried to see her.  RHC reviewed, would push diuresis.  Should she get to euvolemia and still have abnormal imaging, can consider HRCT, this can be done as an OP.  She is okay following up with Dr. Erin Fulling, will set this up in 3-4 weeks for CXR and consideration for HRCT.  PCCM available PRN  Erskine Emery MD PCCM

## 2021-05-15 NOTE — Progress Notes (Signed)
Pt agreed to try CPAP tonight. RT set-up CPAP at 5 cmH2O. Pt could not tolerate any more pressure. Pt will contact RN or RT when she is ready for bed, so that she can be placed on CPAP.

## 2021-05-16 ENCOUNTER — Inpatient Hospital Stay (HOSPITAL_COMMUNITY): Payer: Medicare Other

## 2021-05-16 LAB — BASIC METABOLIC PANEL
Anion gap: 5 (ref 5–15)
BUN: 16 mg/dL (ref 8–23)
CO2: 37 mmol/L — ABNORMAL HIGH (ref 22–32)
Calcium: 9.1 mg/dL (ref 8.9–10.3)
Chloride: 97 mmol/L — ABNORMAL LOW (ref 98–111)
Creatinine, Ser: 1.04 mg/dL — ABNORMAL HIGH (ref 0.44–1.00)
GFR, Estimated: >60 mL/min (ref >60)
Glucose, Bld: 87 mg/dL (ref 70–99)
Potassium: 3.5 mmol/L (ref 3.5–5.1)
Sodium: 139 mmol/L (ref 135–145)

## 2021-05-16 LAB — CBC WITH DIFFERENTIAL/PLATELET
Abs Immature Granulocytes: 0.01 10*3/uL (ref 0.00–0.07)
Basophils Absolute: 0 10*3/uL (ref 0.0–0.1)
Basophils Relative: 0 %
Eosinophils Absolute: 0.5 10*3/uL (ref 0.0–0.5)
Eosinophils Relative: 7 %
HCT: 31 % — ABNORMAL LOW (ref 36.0–46.0)
Hemoglobin: 9.1 g/dL — ABNORMAL LOW (ref 12.0–15.0)
Immature Granulocytes: 0 %
Lymphocytes Relative: 12 %
Lymphs Abs: 0.8 10*3/uL (ref 0.7–4.0)
MCH: 29 pg (ref 26.0–34.0)
MCHC: 29.4 g/dL — ABNORMAL LOW (ref 30.0–36.0)
MCV: 98.7 fL (ref 80.0–100.0)
Monocytes Absolute: 0.9 10*3/uL (ref 0.1–1.0)
Monocytes Relative: 13 %
Neutro Abs: 4.5 10*3/uL (ref 1.7–7.7)
Neutrophils Relative %: 68 %
Platelets: 157 10*3/uL (ref 150–400)
RBC: 3.14 MIL/uL — ABNORMAL LOW (ref 3.87–5.11)
RDW: 17.6 % — ABNORMAL HIGH (ref 11.5–15.5)
WBC: 6.7 10*3/uL (ref 4.0–10.5)
nRBC: 0 % (ref 0.0–0.2)

## 2021-05-16 LAB — PROTIME-INR
INR: 2.4 — ABNORMAL HIGH (ref 0.8–1.2)
Prothrombin Time: 25.9 seconds — ABNORMAL HIGH (ref 11.4–15.2)

## 2021-05-16 LAB — MAGNESIUM: Magnesium: 1.7 mg/dL (ref 1.7–2.4)

## 2021-05-16 LAB — BRAIN NATRIURETIC PEPTIDE: B Natriuretic Peptide: 218.2 pg/mL — ABNORMAL HIGH (ref 0.0–100.0)

## 2021-05-16 MED ORDER — WARFARIN SODIUM 2 MG PO TABS
4.0000 mg | ORAL_TABLET | Freq: Once | ORAL | Status: AC
  Administered 2021-05-16: 4 mg via ORAL
  Filled 2021-05-16: qty 2

## 2021-05-16 MED ORDER — FUROSEMIDE 10 MG/ML IJ SOLN: 60.0000 mg | Freq: Two times a day (BID) | INTRAMUSCULAR | Status: DC

## 2021-05-16 MED ORDER — POTASSIUM CHLORIDE CRYS ER 20 MEQ PO TBCR
40.0000 meq | EXTENDED_RELEASE_TABLET | Freq: Once | ORAL | Status: AC
  Administered 2021-05-16: 40 meq via ORAL
  Filled 2021-05-16: qty 2

## 2021-05-16 MED ORDER — FUROSEMIDE 10 MG/ML IJ SOLN
80.0000 mg | Freq: Two times a day (BID) | INTRAMUSCULAR | Status: DC
Start: 2021-05-16 — End: 2021-05-17
  Administered 2021-05-16 – 2021-05-17 (×3): 80 mg via INTRAVENOUS
  Filled 2021-05-16 (×3): qty 8

## 2021-05-16 NOTE — Progress Notes (Signed)
Mobility Specialist Progress Note:   05/16/21 1340  Mobility  Activity Ambulated in hall  Level of Assistance Standby assist, set-up cues, supervision of patient - no hands on  Distance Ambulated (ft) 240 ft  Mobility Ambulated with assistance in hallway  Mobility Response Tolerated well  Mobility performed by Mobility specialist;Nurse tech  Bed Position Chair  $Mobility charge 1 Mobility   No complaints of pain. Pt required 2 standing breaks and 1 seated break. Returned to chair with call bell in reach and all needs met.   [facility] Public librarian Phone [phone] Secondary Phone [phone]

## 2021-05-16 NOTE — Plan of Care (Signed)

## 2021-05-16 NOTE — Progress Notes (Signed)
PROGRESS NOTE  [name] AST:[id number] DOB: January 07, 1961 DOA: 05/12/2021 PCP: [name], FNP  Brief History   [name] is a 60 y.o. 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   She was being seen today for a TEE to further evaluate her mitral valve and rule out regurgitation. When she came in for procedure she was found to be hypoxic to 75% on room air. She typically wears 2-2.5L [place], but was requiring 4L to maintain sats. CXR was obtained which showed new small right pleural effusion with adjacent right basilar atelectasis versus infiltrate.  With new requirement for increased oxygen and findings on chest x-ray Dr. Harriet Masson with cardiology asked for direct admission.   She tells me she has been feeling "weak" and fatigued x 2 months. She has also had shortness of breath x 2 months. She chronically wears 2-2.5L [place] at home which she states she put herself on "all the time" about 2 months ago, but prior to this was only used this as needed prior to this. She also tells me that if she doesn't wear oxygen she will typically be in the 70s, that is not abnormal for her.  No doctor has told her to wear oxygen. She denies any fever/chills, no chest pain or palpitations, no cough, no abdominal pain, N/V. She has had off and on leg swelling. She is not sure about weight gain. She has variable episodes of diarrhea, none currently. She denies any sick contacts.    She has had her flu shot this year. Past history of smoking, at least a PPD for 40+ years, no drinking. No environmental exposure.    ED Course: vitals: pulse ox: 75% on RA and placed on 4L [place] with increase  to 95%. Bp: 178/78, HR: 63, RR: 17. On 2L [place] at home. CXR obtained which showed new right pleural effusion with adjacent right basilar atelectasis vs. Infiltrate.  Dr. Harriet Masson with cardiology called and asked for direct admit.   No labs at time of direct admit. Labs reviewed from 12/9.  Hgb: 10.4 No wbc Potassium: 3.4  Hospital Course: The patient was admitted to a telemetry bed. Cardiology was consulted. The patient has been seen by [name] of cardiology. She was diuresed. She developed hypokalemia which was supplemented. [name] has added Wilder Glade or empagliflozin. He has recommended supplemental iron and transfusion s if necessary. He also recommends that the patient follow up with [facility] out of concern that the mechanical valves may be causing hemolysis. [name] states that the patient is not a candidate for repeat surgery.Potassium has been supplemented.  Pulmonology was consulted. Dr. Erin Fulling recommends HRCT when the patient is diuresed optimally. There are currently no plans for TEE as the patient is not a candidate for surgical intervention and there is no clear path to respond to any potential findings coming out of the procedure.  ABG from 05/15/2021 demonstrates alkolosis with is probably contraction alkalosis.  Consultants  Cardiology Pulmonology  Procedures  None  Antibiotics   Anti-infectives (From admission, onward)    None      Subjective  The patient is resting comfortably. No new complaints.   Objective   Vitals:  Vitals:   05/16/21  1018 05/16/21 1256  BP: (!) 106/56   Pulse: 76   Resp:    Temp:    SpO2:  94%    Exam:  Constitutional:  The patient is awake, alert, and oriented x 3. No acute distress. Respiratory:  No increased work of breathing.  No wheezes, rales, or rhonchi No tactile fremitus Cardiovascular:  Regular rate and rhythm No murmurs, ectopy, or gallups. No lateral PMI. No thrills. Abdomen:  Abdomen is soft, non-tender,  non-distended No hernias, masses, or organomegaly Normoactive bowel sounds.  Musculoskeletal:  No cyanosis, clubbing, or edema Skin:  No rashes, lesions, ulcers palpation of skin: no induration or nodules Neurologic:  CN 2-12 intact Sensation all 4 extremities intact Psychiatric:  Mental status Mood, affect appropriate Orientation to person, place, time  judgment and insight appear intact  I have personally reviewed the following:   Today's Data  Vitals  Lab Data  BMP CBC  Micro Data  Blood cultures x 2 - no growth  Imaging  CXR  Cardiology Data  EKG   Scheduled Meds:  allopurinol  300 mg Oral QHS   dapagliflozin propanediol  10 mg Oral Daily   FLUoxetine  20 mg Oral Daily   furosemide  80 mg Intravenous BID   loratadine  10 mg Oral QHS   metoprolol tartrate  50 mg Oral Daily   montelukast  10 mg Oral QHS   potassium chloride SA  20 mEq Oral Daily   pravastatin  80 mg Oral q1800   saccharomyces boulardii  250 mg Oral BID   sodium chloride flush  3 mL Intravenous Q12H   sodium chloride flush  3 mL Intravenous Q12H   warfarin  4 mg Oral ONCE-1600   Warfarin - Pharmacist Dosing Inpatient   Does not apply q1600   Continuous Infusions:  sodium chloride     sodium chloride      Principal Problem:   Acute on chronic respiratory failure with hypoxia (HCC) Active Problems:   Chronic asthmatic bronchitis (HCC)   Essential hypertension   Hyperlipidemia   ILD (interstitial lung disease) (HCC)   CKD (chronic kidney disease) stage 3, GFR 30-59 ml/min (HCC)   Obstructive sleep apnea   Chronic acquired lymphedema - Right lower extremity   PAF (paroxysmal atrial fibrillation) (HCC)   Gout   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HCC)   Hemochromatosis   Major depressive disorder, single episode   Acute on chronic diastolic CHF (congestive heart failure) (HCC)   Pulmonary HTN (HCC)    LOS: 4 days   A & P  Acute on chronic respiratory failure with hypoxia (HCC) Patient  presenting for TEE and found to be hypoxic on room air to 75% and needing 4L [place] CXR with atelectasis vs. Infiltrate. Cardiology cancelled procedure and we were asked for a direct admit -she appears to have been on oxygen for some time with 3L with exertion and 2L at rest.  -She has ILD/bronchitis with no f/u with pulm recently. ? If progressing disease -bnp is elevated with increased SOB and leg swelling and could have some acute on chronic CHF contributing -will treat above and wean back to baseline as tolerated.   Active Problems:   Acute on chronic diastolic CHF (congestive heart failure) (HCC) Due to aortic stenosis. -based off findings on clinical exam including bilateral leg edema worse than baseline, findings on CXR: of new small right pleural effusion, no overt edema and elevated BNP suggests acute on chronic diastolic CHF exacerbation -echo done on 05/04/21:  EF of 55-60%, normal LVF.diastolic parameters are indeterminate. Severely elevated pulmonary artery systolic pressure  -strict I/O and daily weights -Continue IV diuresis with 40mg  IV x one today and will see how she responds. Held oral lasix.  -telemetry  -Cardiology has been consulted.  Pt is negative 5084.5 cc in terms of fluid balance.  Pulmonary hypertension with elevated wedge pressure Noted. Diuresis to reduce preload.   PAF (paroxysmal atrial fibrillation) (HCC) Rate controlled today. Plans for cardioversion later this month  Continue eliquis, metoprolol and telemetry    Mechanical valves (aorta and mitral) Plans for TEE  to see if any issues with valves causing hemolytic anemia, but cancelled due to hypoxia.      ILD (interstitial lung disease)/Chronic asthmatic bronchitis ([place]) -per OV notes from pulm in 2019 she was on 3L oxygen with walking and 2L at rest, sleeping with 2L.  -? If worsening disease, needs high resolution CT scan -briefly discussed with her cardiologist Dr. Erin Fulling has recommended HRCT once she is  optimally diuresed. -pulmonology consulted.   CKD (chronic kidney disease) stage 3, GFR 30-59 ml/min (HCC) Below baseline, continue to monitor. Creatinine 1.09   Essential hypertension Normotensive on home medication (metoprolol) and monitor  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HCC) Recently seen by hematology for new onset anemia and w/u reveals concerns for hemolytic anemia from mechanical valves  TEE cancelled for now. Hgb stable, improving continue to monitor.   Chronic acquired lymphedema - Right lower extremity Appears to be at her baseline   Pulmonary Hemochromatosis 1 abnormal H63D mutation. Stable. Will need to have follow up HRCT to evaluate after more diuresis.   Major depressive disorder, single episode Stable, continue prozac    Gout Stable, continue colchicine   Hyperlipidemia Continue pravachol    Obstructive sleep apnea  States she had a sleep study in 2019 and did not need a cpap machine. Will need another sleep study as outpatient. There is a sleep study from 2018 in chart Per pulm notes in 2019, wearing 2L [place] at night    Body mass index is 43.9 kg/m.   I have seen and examined this patient myself. I have spent 32 minutes in her evaluation and care.   Level of care: Telemetry Medical DVT prophylaxis:  warfarin  Code Status:  Full - confirmed with patient Family Communication: sister at bedside: donna evans  Disposition Plan:  The patient is from: home             Anticipated d/c is to: home              Requires inpatient hospitalization and believe she requires greater than 2 midnight stays due to increased need of oxygen with acute on chronic failure with hypoxia. She is at significant risk of worsening, requires constant monitoring, IV medication, imaging and assessment and MDM with specialists.   Mardella Nuckles, DO [facility] Direct contact: see [email or web address]  7PM-7AM contact night coverage as above 05/16/2021, 2:52 PM  LOS: 1 day

## 2021-05-16 NOTE — Progress Notes (Signed)
ANTICOAGULATION CONSULT NOTE - Follow Up Consult  Pharmacy Consult for warfarin Indication: atrial fibrillation and mechanical valve  No Known Allergies  Patient Measurements: Height: 5\' 6"  (167.6 cm) Weight: 114.8 kg (253 lb) IBW/kg (Calculated) : 59.3  Vital Signs: Temp: 98.4 F (36.9 C) (12/17 0342) Temp Source: Oral (12/17 0342) BP: 116/62 (12/17 0342) Pulse Rate: 90 (12/17 0342)  Labs: Recent Labs    05/14/21 0446 05/14/21 1017 05/14/21 1018 05/15/21 0201 05/16/21 0220  HGB 9.9*   < > 10.9* 10.4* 9.1*  HCT 32.8*   < > 32.0* 35.3* 31.0*  PLT 166  --   --  183 157  LABPROT 29.4*  --   --  26.9* 25.9*  INR 2.8*  --   --  2.5* 2.4*  CREATININE 0.98  --   --   --  1.04*   < > = values in this interval not displayed.     Estimated Creatinine Clearance: 73.1 mL/min (A) (by C-G formula based on SCr of 1.04 mg/dL (H)).   Medications:  Medications Prior to Admission  Medication Sig Dispense Refill Last Dose   acetaminophen (TYLENOL) 650 MG CR tablet Take 1,300 mg by mouth daily.   05/11/2021   allopurinol (ZYLOPRIM) 300 MG tablet Take 300 mg by mouth at bedtime.    Past Week   cetirizine (ZYRTEC) 10 MG tablet Take 10 mg by mouth daily.   05/11/2021   FLUoxetine (PROZAC) 20 MG capsule Take 20 mg by mouth daily.   05/11/2021   furosemide (LASIX) 40 MG tablet Take 40 mg by mouth daily.   05/11/2021   metoprolol tartrate (LOPRESSOR) 50 MG tablet Take 1 tablet (50 mg total) by mouth daily. 30 tablet 0 05/12/2021   montelukast (SINGULAIR) 10 MG tablet Take 10 mg by mouth daily.    05/11/2021   potassium chloride SA (KLOR-CON) 20 MEQ tablet Take 1 tablet (20 mEq total) by mouth daily. 60 tablet 0 05/11/2021   pravastatin (PRAVACHOL) 80 MG tablet Take 80 mg by mouth daily.    05/11/2021   warfarin (COUMADIN) 7.5 MG tablet Take 7.5mg  (1 full tablet) Monday, Wednesday, Friday. Take 3.75mg  (half a tablet) Saturday, Tuesday, Thursday, Sunday. INR check once a week (Patient taking  differently: Take 3.75-7.5 mg by mouth See admin instructions. Take 7.5mg  (1 full tablet) Monday and Friday. Take 3.75mg  (half a tablet) Saturday, Tuesday, Wednesday, Thursday, Sunday. INR check once a week) 90 tablet 1 05/11/2021   OXYGEN Inhale 2 L into the lungs daily as needed (SOB).       Assessment: 60 yo F with hx of mechanical mitral and aortic valves and afib presented 12/13 for planned TEE to evaluate MV, found to have with acute on chronic respiratory failure with hypoxia and to be admitted. Pharmacy consulted to dose warfarin inpatient. INR therapeutic on home regimen.   INR slightly below goal (2.4) continued on home dose of warfarin. No bleeding issues noted overnight, Hgb slightly down, plt stable.    PTA warfarin dose: 7.5mg  on Mon/Fri, 3.75 mg all other days (last dose 12/12 PTA)  Goal of Therapy:  INR 2.5-3.5 Monitor platelets by anticoagulation protocol: Yes   Plan:  Give Warfarin 4 mg (home dose 3.75 mg) x1 today Continue daily INR Monitor CBC, s/sx of bleeding  Laurey Arrow, PharmD PGY1 Pharmacy Resident 05/16/2021  8:44 AM  Please check [email or web address] for unit-specific pharmacy phone numbers.

## 2021-05-16 NOTE — Plan of Care (Signed)
°  Problem: Activity: Goal: Capacity to carry out activities will improve Outcome: Progressing   Problem: Cardiac: Goal: Ability to achieve and maintain adequate cardiopulmonary perfusion will improve Outcome: Progressing   Problem: Education: Goal: Knowledge of General Education information will improve Description: Including pain rating scale, medication(s)/side effects and non-pharmacologic comfort measures Outcome: Progressing   Problem: Health Behavior/Discharge Planning: Goal: Ability to manage health-related needs will improve Outcome: Progressing   

## 2021-05-16 NOTE — Plan of Care (Signed)
°  Problem: Activity: Goal: Capacity to carry out activities will improve Outcome: Progressing   Problem: Cardiac: Goal: Ability to achieve and maintain adequate cardiopulmonary perfusion will improve Outcome: Progressing   Problem: Education: Goal: Knowledge of General Education information will improve Description: Including pain rating scale, medication(s)/side effects and non-pharmacologic comfort measures Outcome: Progressing   Problem: Health Behavior/Discharge Planning: Goal: Ability to manage health-related needs will improve Outcome: Progressing   Problem: Clinical Measurements: Goal: Ability to maintain clinical measurements within normal limits will improve Outcome: Progressing Goal: Will remain free from infection Outcome: Progressing Goal: Diagnostic test results will improve Outcome: Progressing Goal: Respiratory complications will improve Outcome: Progressing Goal: Cardiovascular complication will be avoided Outcome: Progressing

## 2021-05-16 NOTE — Progress Notes (Signed)
Progress Note  Patient Name: BLANCHE SCOVELL Date of Encounter: 05/16/2021  [facility] Cardiologist: Sinclair Grooms, MD   Subjective   Significant diuresis in interim.  Patient notes that she doesn't remember a lot from yesterdays discussion, but agrees that she only wants to do the TEE if there is a clear plan of what we would do based on results.  Inpatient Medications    Scheduled Meds:  allopurinol  300 mg Oral QHS   dapagliflozin propanediol  10 mg Oral Daily   FLUoxetine  20 mg Oral Daily   loratadine  10 mg Oral QHS   metoprolol tartrate  50 mg Oral Daily   montelukast  10 mg Oral QHS   potassium chloride SA  20 mEq Oral Daily   pravastatin  80 mg Oral q1800   saccharomyces boulardii  250 mg Oral BID   sodium chloride flush  3 mL Intravenous Q12H   sodium chloride flush  3 mL Intravenous Q12H   Warfarin - Pharmacist Dosing Inpatient   Does not apply q1600   Continuous Infusions:  sodium chloride     sodium chloride     PRN Meds: sodium chloride, sodium chloride, acetaminophen, loperamide, morphine injection, ondansetron (ZOFRAN) IV, sodium chloride flush, sodium chloride flush   Vital Signs    Vitals:   05/15/21 0735 05/15/21 1926 05/16/21 0330 05/16/21 0342  BP: 138/68 119/65  116/62  Pulse: 86 66  90  Resp: 18 12  16   Temp: 98.7 F (37.1 C) 97.9 F (36.6 C)  98.4 F (36.9 C)  TempSrc: Oral Oral  Oral  SpO2:  93%  93%  Weight:   114.8 kg   Height:        Intake/Output Summary (Last 24 hours) at 05/16/2021 0834 Last data filed at 05/16/2021 0345 Gross per 24 hour  Intake 1200 ml  Output 3700 ml  Net -2500 ml   Last 3 Weights 05/16/2021 05/15/2021 05/14/2021  Weight (lbs) 253 lb 256 lb 256 lb 11.2 oz  Weight (kg) 114.76 kg 116.121 kg 116.438 kg      Telemetry    Atrial flutter with controlled ventricular response ( slight increase in rare this AM)- Personally Reviewed  ECG    No new data- Personally Reviewed  Physical Exam    GEN: No acute distress, morbid obesity Neck: Elevated to angle to the mid neck Cardiac: Mechanical valve closure sounds. Respiratory: Decreased breath sounds bilaterally GI: Soft, nontender, non-distended  MS: Bilateral lower extremity edema; No deformity. Neuro:  Nonfocal  Psych: Normal affect   Labs    High Sensitivity Troponin:  No results for input(s): TROPONINIHS in the last 720 hours.   Chemistry Recent Labs  Lab 05/12/21 1520 05/13/21 0518 05/14/21 0446 05/14/21 1017 05/14/21 1018 05/16/21 0220  NA 141 140 138 143 143 139  K 3.9 3.1* 3.4* 3.6 3.7 3.5  CL 104 102 100  --   --  97*  CO2 32 29 31  --   --  37*  GLUCOSE 84 87 86  --   --  87  BUN 12 10 10   --   --  16  CREATININE 0.78 0.86 0.98  --   --  1.04*  CALCIUM 9.3 9.1 8.9  --   --  9.1  MG 1.8  --   --   --   --  1.7  PROT 6.7  --   --   --   --   --  ALBUMIN 3.4*  --   --   --   --   --   AST 24  --   --   --   --   --   ALT 11  --   --   --   --   --   ALKPHOS 49  --   --   --   --   --   BILITOT 1.2  --   --   --   --   --   GFRNONAA >60 >60 >60  --   --  >60  ANIONGAP 5 9 7   --   --  5    Lipids No results for input(s): CHOL, TRIG, HDL, LABVLDL, LDLCALC, CHOLHDL in the last 168 hours.  Hematology Recent Labs  Lab 05/14/21 0446 05/14/21 1017 05/14/21 1018 05/15/21 0201 05/16/21 0220  WBC 6.6  --   --  7.2 6.7  RBC 3.36*  --   --  3.55* 3.14*  HGB 9.9*   < > 10.9* 10.4* 9.1*  HCT 32.8*   < > 32.0* 35.3* 31.0*  MCV 97.6  --   --  99.4 98.7  MCH 29.5  --   --  29.3 29.0  MCHC 30.2  --   --  29.5* 29.4*  RDW 17.9*  --   --  17.6* 17.6*  PLT 166  --   --  183 157   < > = values in this interval not displayed.   Thyroid  Recent Labs  Lab 05/13/21 0518  TSH 2.344    BNP Recent Labs  Lab 05/12/21 1520  BNP 479.2*    DDimer No results for input(s): DDIMER in the last 168 hours.   Radiology    CARDIAC CATHETERIZATION  Result Date: 05/14/2021 Images from the original result were  not included. RHIA BLATCHFORD is a 60 y.o. female  [id number] LOCATION:  FACILITY: [facility] PHYSICIAN: Quay Burow, M.D. 1960-12-11 DATE OF PROCEDURE:  05/14/2021 DATE OF DISCHARGE: Right Heart Cath History obtained from chart review.60 y.o. 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  Was attempting transesophageal echo when noted to be severely hypoxic, chest x-ray revealed pleural effusion, and she was admitted to the hospital for management of hypoxia and new pulmonary findings.  The patient has been diuresed but still remains hypoxic.  2D echo revealed preserved LV function with a normally functioning aortic and mitral valve mechanical prostheses.  She presents now for right heart cath to define her physiology. HEMODYNAMICS:  Right atrial pressure-16/21, mean 18 Right ventricular pressure-91/9 Pulmonary artery pressure-90/41, mean 60 Pulmonary wedge pressure-A-wave 25, V wave 20, mean 24 Cardiac output-7 L/min with an index of 3.2 L/min/m.   Ms. Negrete has severe pulmonary hypertension with elevated wedge pressures as well.  She will need additional diuresis.  Her sats were approximately 88% on 4 L of oxygen.  The right antecubital sheath was removed and pressure held.  The patient left lab in stable condition.  [name], the patient's attending cardiologist, was notified of these results. Quay Burow. MD, [facility] 05/14/2021 10:29 AM     Cardiac Studies   Right heart cath 05/14/2021: HEMODYNAMICS:    Right atrial pressure-16/21, mean 18 Right ventricular pressure-91/9 Pulmonary artery pressure-90/41, mean 60 Pulmonary wedge pressure-A-wave 25, V  wave 20, mean 24 Cardiac output-7 L/min with an index of 3.2 L/min/m.  Patient Profile     60 y.o. 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  Was attempting transesophageal echo when noted to be severely hypoxic, chest x-ray revealed pleural effusion, and she was admitted to the hospital for management of hypoxia and new pulmonary findings.  Right heart cath demonstrated severe pulmonary hypertension with PA systolic pressure 90 mmHg.  Pulmonary wedge pressure 24 mmHg.  Assessment & Plan    Mixed picture PH candidate related to OSA, OHS and Morbid Obesity, ILD and Pulmonary hemosiderosis AFL Severe AS moderate MS s/p Mechanical ST AVR and 25 mm ST MVR - last give IV Lasix 40 IV BID (mar shows both 40 and 80), will attempt 80 IV lasix BID today - There was a plan for TEE, for worries of hemolysis; there are concerns from her primary cardiologist that she would be a poor ViV candidate secondary to her other comorbidities - presently not planned for TEE this admission - INR goal has been set to 3.0 +/- 0.5  no ASA in the balance of her hemlyosis, two AVR and AFL - at home is on 2 L 02, still on 4 L with Sat of 90  For questions or updates, please contact [place] Please consult [email or web address] for contact info under        Signed, Werner Lean, MD  05/16/2021, 8:34 AM

## 2021-05-16 NOTE — Progress Notes (Signed)
NAME:  [name], MRN:  [id number], DOB:  14-Apr-1961, LOS: 4 ADMISSION DATE:  05/12/2021, CONSULTATION DATE:  12/13 REFERRING MD:  Rogers Blocker, CHIEF COMPLAINT:  Dyspnea   History of Present Illness:  60 y/o female previously followed in the pulmonary clinic presented to [facility] after she was noted to be hypoxemic while waiting for a TEE to evaluate for mitral valve disease.  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  She underwent mitral valve and aortic valve replacement at [facility] and her lung disease stabilized, then after weight loss surgery in 2019 her chronic hypoxemic respiratory failure resolved and she was taken off oxygen.  Around this time she was also told that she no longer had sleep apnea and stopped using CPAP.    In the last few months she has had worsening fatigue and was found to have evidence of hemolysis without evidence of an underlying autoimmune process.  She was referred to cardiology for evaluation of her valvular heart disease.  They referred her for a TEE and while waiting she was noted to be hypoxemic.  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   2014 S/p mitral and aortic valve replacement 2015 Former smoker Asthma OSA Obesity, s/p weight laparoscopic gastric sleeve resection in 3818 complicated by hematoma Interior hematoma  Chronic respiratory failure with hypoxemia  Non-infectious lymphdema Gout Hypertension Hyperlipidemia   Significant Hospital Events: Including procedures, antibiotic start and stop dates in addition to other pertinent events   12/13 admission  Studies: 12/5 TTE > LVEF 29-93%, RV systolic function normal, severely elevated PA pressur, LA mod dilated, mech st jude mitral gradient 46mmgHg; mech AV valve with 63mmHg 12/15 RHC Right atrial pressure-16/21, mean 18  Right  ventricular pressure-91/9 Pulmonary artery pressure-90/41, mean 60 Pulmonary wedge pressure-A-wave 25, V  ave 20, mean 24 Cardiac output-7 L/min with an index of 3.2 L/min/m.  Interim History / Subjective:  Improved with diuresis  Objective   Blood pressure (!) 106/56, pulse 76, temperature 98.4 F (36.9 C), temperature source Oral, resp. rate 19, height 5\' 6"  (1.676 m), weight 114.8 kg, SpO2 94 %.        Intake/Output Summary (Last 24 hours) at 05/16/2021 1455 Last data filed at 05/16/2021 1256 Gross per 24 hour  Intake 1080 ml  Output 3925 ml  Net -2845 ml    Filed Weights   05/14/21 0316 05/15/21 0206 05/16/21 0330  Weight: 116.4 kg 116.1 kg 114.8 kg    Examination:  General:  Resting comfortably in bed HENT: NCAT OP clear PULM: CTA B, normal effort CV: RRR, no mgr GI: BS+, soft, nontender MSK: normal bulk and tone Neuro: awake, alert, no distress, MAEW   Resolved Hospital Problem list     Assessment & Plan:  Pulmonary hypertension with elevated wedge pressure Acute on chronic hypoxemic respiratory failure Acute heart failure due to aortic stenosis Obstructive sleep apnea 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  Plan: Agree with diuresis CPAP at night, needs outpatient sleep study  Wean off O2 for O2 saturation > 88% Ambulate as much as possible Ambulate and document O2 saturations and amount of flow of O2 needed to maintain oxygen and provide prescription at discharge CT chest without contrast once euvolemic/ no longer receiving IV lasix - Chloride dropping and Cr rising suspect getting close  Pulmonary  will follow up on Monday 12/19   Best Practice (right click and "Reselect all SmartList Selections" daily)   Per [facility]  Labs   CBC: Recent Labs  Lab 05/12/21 1520 05/13/21 0518 05/14/21 0446 05/14/21 1017 05/14/21 1018 05/15/21 0201 05/16/21 0220  WBC 6.8 7.3 6.6  --   --  7.2 6.7  NEUTROABS  5.1  --   --   --   --   --  4.5  HGB 10.7* 10.0* 9.9* 10.5* 10.9* 10.4* 9.1*  HCT 35.4* 32.7* 32.8* 31.0* 32.0* 35.3* 31.0*  MCV 99.2 97.9 97.6  --   --  99.4 98.7  PLT 191 182 166  --   --  183 157     Basic Metabolic Panel: Recent Labs  Lab 05/12/21 1520 05/13/21 0518 05/14/21 0446 05/14/21 1017 05/14/21 1018 05/16/21 0220  NA 141 140 138 143 143 139  K 3.9 3.1* 3.4* 3.6 3.7 3.5  CL 104 102 100  --   --  97*  CO2 32 29 31  --   --  37*  GLUCOSE 84 87 86  --   --  87  BUN 12 10 10   --   --  16  CREATININE 0.78 0.86 0.98  --   --  1.04*  CALCIUM 9.3 9.1 8.9  --   --  9.1  MG 1.8  --   --   --   --  1.7    GFR: Estimated Creatinine Clearance: 73.1 mL/min (A) (by C-G formula based on SCr of 1.04 mg/dL (H)). Recent Labs  Lab 05/12/21 1520 05/13/21 0518 05/14/21 0446 05/15/21 0201 05/16/21 0220  PROCALCITON <0.10 <0.10 <0.10  --   --   WBC 6.8 7.3 6.6 7.2 6.7     Liver Function Tests: Recent Labs  Lab 05/12/21 1520  AST 24  ALT 11  ALKPHOS 49  BILITOT 1.2  PROT 6.7  ALBUMIN 3.4*    No results for input(s): LIPASE, AMYLASE in the last 168 hours. No results for input(s): AMMONIA in the last 168 hours.  ABG    Component Value Date/Time   PHART 7.424 05/15/2021 1814   PCO2ART 52.6 (H) 05/15/2021 1814   PO2ART 59.1 (L) 05/15/2021 1814   HCO3 33.8 (H) 05/15/2021 1814   TCO2 38 (H) 05/14/2021 1018   ACIDBASEDEF 1.1 02/03/2018 1938   O2SAT 90.6 05/15/2021 1814      Coagulation Profile: Recent Labs  Lab 05/12/21 1539 05/13/21 0518 05/14/21 0446 05/15/21 0201 05/16/21 0220  INR 2.6* 2.9* 2.8* 2.5* 2.4*     Cardiac Enzymes: No results for input(s): CKTOTAL, CKMB, CKMBINDEX, TROPONINI in the last 168 hours.  HbA1C: No results found for: HGBA1C  CBG: No results for input(s): GLUCAP in the last 168 hours.  Critical care time: n/a     Lanier Clam, MD [place] [facility] See Amion for contact info After 7:00 pm call Elink   [phone]

## 2021-05-17 LAB — BASIC METABOLIC PANEL
Anion gap: 6 (ref 5–15)
BUN: 14 mg/dL (ref 8–23)
CO2: 38 mmol/L — ABNORMAL HIGH (ref 22–32)
Calcium: 9.2 mg/dL (ref 8.9–10.3)
Chloride: 95 mmol/L — ABNORMAL LOW (ref 98–111)
Creatinine, Ser: 1.02 mg/dL — ABNORMAL HIGH (ref 0.44–1.00)
GFR, Estimated: >60 mL/min (ref >60)
Glucose, Bld: 86 mg/dL (ref 70–99)
Potassium: 3.7 mmol/L (ref 3.5–5.1)
Sodium: 139 mmol/L (ref 135–145)

## 2021-05-17 LAB — PROTIME-INR
INR: 2.2 — ABNORMAL HIGH (ref 0.8–1.2)
Prothrombin Time: 24.4 seconds — ABNORMAL HIGH (ref 11.4–15.2)

## 2021-05-17 MED ORDER — ENOXAPARIN SODIUM 120 MG/0.8ML IJ SOSY
113.0000 mg | PREFILLED_SYRINGE | Freq: Two times a day (BID) | INTRAMUSCULAR | Status: DC
Start: 2021-05-17 — End: 2021-05-19
  Administered 2021-05-17 – 2021-05-18 (×3): 113 mg via SUBCUTANEOUS
  Filled 2021-05-17 (×5): qty 0.76

## 2021-05-17 MED ORDER — WARFARIN SODIUM 3 MG PO TABS
6.0000 mg | ORAL_TABLET | Freq: Once | ORAL | Status: AC
  Administered 2021-05-17: 17:00:00 6 mg via ORAL
  Filled 2021-05-17: qty 2

## 2021-05-17 MED ORDER — FUROSEMIDE 10 MG/ML IJ SOLN
80.0000 mg | Freq: Two times a day (BID) | INTRAMUSCULAR | Status: AC
  Administered 2021-05-17 – 2021-05-18 (×2): 80 mg via INTRAVENOUS
  Filled 2021-05-17 (×2): qty 8

## 2021-05-17 MED ORDER — ENOXAPARIN SODIUM 120 MG/0.8ML IJ SOSY
1.0000 mg/kg | PREFILLED_SYRINGE | Freq: Two times a day (BID) | INTRAMUSCULAR | Status: DC
  Filled 2021-05-17: qty 0.76

## 2021-05-17 NOTE — Plan of Care (Signed)
°  Problem: Activity: Goal: Capacity to carry out activities will improve Outcome: Progressing   Problem: Cardiac: Goal: Ability to achieve and maintain adequate cardiopulmonary perfusion will improve Outcome: Progressing   Problem: Education: Goal: Knowledge of General Education information will improve Description: Including pain rating scale, medication(s)/side effects and non-pharmacologic comfort measures Outcome: Progressing   Problem: Health Behavior/Discharge Planning: Goal: Ability to manage health-related needs will improve Outcome: Progressing   Problem: Clinical Measurements: Goal: Ability to maintain clinical measurements within normal limits will improve Outcome: Progressing Goal: Will remain free from infection Outcome: Progressing Goal: Diagnostic test results will improve Outcome: Progressing Goal: Respiratory complications will improve Outcome: Progressing Goal: Cardiovascular complication will be avoided Outcome: Progressing

## 2021-05-17 NOTE — Progress Notes (Signed)
PROGRESS NOTE  [name] WCB:[id number] DOB: [birth year] DOA: 05/12/2021 PCP: [name], FNP  Brief History   [name] is a 60 y.o. 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   She was being seen today for a TEE to further evaluate her mitral valve and rule out regurgitation. When she came in for procedure she was found to be hypoxic to 75% on room air. She typically wears 2-2.5L [place], but was requiring 4L to maintain sats. CXR was obtained which showed new small right pleural effusion with adjacent right basilar atelectasis versus infiltrate.  With new requirement for increased oxygen and findings on chest x-ray Dr. Harriet Masson with cardiology asked for direct admission.   She tells me she has been feeling "weak" and fatigued x 2 months. She has also had shortness of breath x 2 months. She chronically wears 2-2.5L [place] at home which she states she put herself on "all the time" about 2 months ago, but prior to this was only used this as needed prior to this. She also tells me that if she doesn't wear oxygen she will typically be in the 70s, that is not abnormal for her.  No doctor has told her to wear oxygen. She denies any fever/chills, no chest pain or palpitations, no cough, no abdominal pain, N/V. She has had off and on leg swelling. She is not sure about weight gain. She has variable episodes of diarrhea, none currently. She denies any sick contacts.    She has had her flu shot this year. Past history of smoking, at least a PPD for 40+ years, no drinking. No environmental exposure.    ED Course: vitals: pulse ox: 75% on RA and placed on 4L [place] with increase  to 95%. Bp: 178/78, HR: 63, RR: 17. On 2L [place] at home. CXR obtained which showed new right pleural effusion with adjacent right basilar atelectasis vs. Infiltrate.  Dr. Harriet Masson with cardiology called and asked for direct admit.   No labs at time of direct admit. Labs reviewed from 12/9.  Hgb: 10.4 No wbc Potassium: 3.4  Hospital Course: The patient was admitted to a telemetry bed. Cardiology was consulted. The patient has been seen by [name] of cardiology. She was diuresed. She developed hypokalemia which was supplemented. [name] has added Wilder Glade or empagliflozin. He has recommended supplemental iron and transfusion s if necessary. He also recommends that the patient follow up with [facility] out of concern that the mechanical valves may be causing hemolysis. [name] states that the patient is not a candidate for repeat surgery.Potassium has been supplemented.  Pulmonology was consulted. Dr. Erin Fulling recommends HRCT when the patient is diuresed optimally. There are currently no plans for TEE as the patient is not a candidate for surgical intervention and there is no clear path to respond to any potential findings coming out of the procedure.  ABG from 05/15/2021 demonstrates alkolosis with is probably contraction alkalosis.  Consultants  Cardiology Pulmonology  Procedures  None  Antibiotics   Anti-infectives (From admission, onward)    None      Subjective  The patient is resting comfortably. No new complaints.   Objective   Vitals:  Vitals:   05/17/21  0444 05/17/21 1117  BP: 113/65 111/66  Pulse:  62  Resp: 17 19  Temp: 97.6 F (36.4 C) 97.9 F (36.6 C)  SpO2: 93% 95%    Exam:  Constitutional:  The patient is awake, alert, and oriented x 3. No acute distress. Respiratory:  No increased work of breathing.  No wheezes, rales, or rhonchi No tactile fremitus Cardiovascular:  Regular rate and rhythm No murmurs, ectopy, or gallups. No lateral PMI. No thrills. Abdomen:   Abdomen is soft, non-tender, non-distended No hernias, masses, or organomegaly Normoactive bowel sounds.  Musculoskeletal:  No cyanosis, clubbing, or edema Skin:  No rashes, lesions, ulcers palpation of skin: no induration or nodules Neurologic:  CN 2-12 intact Sensation all 4 extremities intact Psychiatric:  Mental status Mood, affect appropriate Orientation to person, place, time  judgment and insight appear intact  I have personally reviewed the following:   Today's Data  Vitals  Lab Data  BMP CBC  Micro Data  Blood cultures x 2 - no growth  Imaging  CXR  Cardiology Data  EKG   Scheduled Meds:  allopurinol  300 mg Oral QHS   dapagliflozin propanediol  10 mg Oral Daily   enoxaparin (LOVENOX) injection  113 mg Subcutaneous Q12H   FLUoxetine  20 mg Oral Daily   furosemide  80 mg Intravenous BID   loratadine  10 mg Oral QHS   metoprolol tartrate  50 mg Oral Daily   montelukast  10 mg Oral QHS   potassium chloride SA  20 mEq Oral Daily   pravastatin  80 mg Oral q1800   saccharomyces boulardii  250 mg Oral BID   sodium chloride flush  3 mL Intravenous Q12H   sodium chloride flush  3 mL Intravenous Q12H   warfarin  6 mg Oral ONCE-1600   Warfarin - Pharmacist Dosing Inpatient   Does not apply q1600   Continuous Infusions:  sodium chloride     sodium chloride      Principal Problem:   Acute on chronic respiratory failure with hypoxia (HCC) Active Problems:   Chronic asthmatic bronchitis (HCC)   Essential hypertension   Hyperlipidemia   ILD (interstitial lung disease) (HCC)   CKD (chronic kidney disease) stage 3, GFR 30-59 ml/min (HCC)   Obstructive sleep apnea   Chronic acquired lymphedema - Right lower extremity   PAF (paroxysmal atrial fibrillation) (HCC)   Gout   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HCC)   Hemochromatosis   Major depressive disorder, single episode   Acute on chronic diastolic CHF (congestive heart failure) (HCC)   Pulmonary HTN (HCC)    LOS:  5 days   A & P  Acute on chronic respiratory failure with hypoxia ([place]) Patient presenting for TEE and found to be hypoxic on room air to 75% and needing 4L [place]. She currently is only saturating 90% on 4L. CXR with atelectasis vs. Infiltrate. Cardiology cancelled procedure and we were asked for a direct admit -she appears to have been on oxygen for some time with 3L with exertion and 2L at rest.  -She has ILD/bronchitis with no f/u with pulm recently. ? If progressing disease -bnp is elevated with increased SOB and leg swelling and could have some acute on chronic CHF contributing -will treat above and wean back to baseline as tolerated.   Active Problems:   Acute on chronic diastolic CHF (congestive heart failure) (HCC) Due to aortic stenosis. -based off findings on clinical exam including bilateral leg edema worse than baseline, findings on CXR:  of new small right pleural effusion, no overt edema and elevated BNP suggests acute on chronic diastolic CHF exacerbation -echo done on 05/04/21: EF of 55-60%, normal LVF.diastolic parameters are indeterminate. Severely elevated pulmonary artery systolic pressure  -strict I/O and daily weights -Continue IV diuresis with 40mg  IV x one today and will see how she responds. Held oral lasix.  -telemetry  -Cardiology has been consulted.  Pt is negative 5994 cc in terms of fluid balance.  Pulmonary hypertension with elevated wedge pressure Noted. Diuresis to reduce preload.   PAF (paroxysmal atrial fibrillation) (HCC) Rate controlled today. Plans for cardioversion later this month  Continue eliquis, metoprolol and telemetry    Mechanical valves (aorta and mitral) Plans for TEE  to see if any issues with valves causing hemolytic anemia, but cancelled due to hypoxia.      ILD (interstitial lung disease)/Chronic asthmatic bronchitis ([place]) -per OV notes from pulm in 2019 she was on 3L oxygen with walking and 2L at rest, sleeping with 2L.  -? If  worsening disease, needs high resolution CT scan -briefly discussed with her cardiologist Dr. Erin Fulling has recommended HRCT once she is optimally diuresed. -pulmonology consulted.   CKD (chronic kidney disease) stage 3, GFR 30-59 ml/min (HCC) Below baseline, continue to monitor. Creatinine 1.04   Essential hypertension Normotensive on home medication (metoprolol) and monitor  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HCC) Recently seen by hematology for new onset anemia and w/u reveals concerns for hemolytic anemia from mechanical valves  TEE cancelled for now. Hgb stable, improving continue to monitor.   Chronic acquired lymphedema - Right lower extremity Appears to be at her baseline   Pulmonary Hemochromatosis 1 abnormal H63D mutation. Stable. Will need to have follow up HRCT to evaluate after more diuresis.   Major depressive disorder, single episode Stable, continue prozac    Gout Stable, continue colchicine   Hyperlipidemia Continue pravachol    Obstructive sleep apnea  States she had a sleep study in 2019 and did not need a cpap machine. Will need another sleep study as outpatient. There is a sleep study from 2018 in chart Per pulm notes in 2019, wearing 2L [place] at night    Body mass index is 43.9 kg/m.   I have seen and examined this patient myself. I have spent 32 minutes in her evaluation and care.   Level of care: Telemetry Medical DVT prophylaxis:  warfarin  Code Status:  Full - confirmed with patient Family Communication: sister at bedside: donna evans  Disposition Plan:  The patient is from: home             Anticipated d/c is to: home              Requires inpatient hospitalization and believe she requires greater than 2 midnight stays due to increased need of oxygen with acute on chronic failure with hypoxia. She is at significant risk of worsening, requires constant monitoring, IV medication, imaging and assessment and MDM with specialists.   Gustie Bobb, DO [facility] Direct contact: see [email or web address]  7PM-7AM contact night coverage as above 05/17/2021, 1:41 PM  LOS: 1 day

## 2021-05-17 NOTE — Progress Notes (Signed)
Mobility Specialist Progress Note:   05/17/21 1441  Mobility  Activity Ambulated in hall  Level of Assistance Standby assist, set-up cues, supervision of patient - no hands on  Assistive Device None  Distance Ambulated (ft) 240 ft  Mobility Ambulated with assistance in hallway  Mobility Response Tolerated well  Mobility performed by Mobility specialist  $Mobility charge 1 Mobility   Pt received in bed willing to participate in mobility. No complaints of pain. Pt required 1 standing break halfway d/t being SOB. Pt returned to bed with call bell in reach and all needs met.   [facility] Public librarian Phone [phone] Secondary Phone [phone]

## 2021-05-17 NOTE — Progress Notes (Signed)
Progress Note  Patient Name: [name] Date of Encounter: 05/17/2021  [facility] Cardiologist: Belva Crome III, MD   Subjective   Continued output in interval.  Pulm plans for CT Non conc once clinically euvolemic Patient is still needing 4 L O2.  Notes significant diuresis.  Inpatient Medications    Scheduled Meds:  allopurinol  300 mg Oral QHS   dapagliflozin propanediol  10 mg Oral Daily   FLUoxetine  20 mg Oral Daily   furosemide  80 mg Intravenous BID   loratadine  10 mg Oral QHS   metoprolol tartrate  50 mg Oral Daily   montelukast  10 mg Oral QHS   potassium chloride SA  20 mEq Oral Daily   pravastatin  80 mg Oral q1800   saccharomyces boulardii  250 mg Oral BID   sodium chloride flush  3 mL Intravenous Q12H   sodium chloride flush  3 mL Intravenous Q12H   Warfarin - Pharmacist Dosing Inpatient   Does not apply q1600   Continuous Infusions:  sodium chloride     sodium chloride     PRN Meds: sodium chloride, sodium chloride, acetaminophen, loperamide, morphine injection, ondansetron (ZOFRAN) IV, sodium chloride flush, sodium chloride flush   Vital Signs    Vitals:   05/16/21 1935 05/16/21 2155 05/17/21 0444 05/17/21 0452  BP: (!) 122/55  113/65   Pulse: 73 73    Resp:  (!) 23 17   Temp: 97.6 F (36.4 C)  97.6 F (36.4 C)   TempSrc: Oral  Oral   SpO2: 100% 95% 93%   Weight:    112.5 kg  Height:        Intake/Output Summary (Last 24 hours) at 05/17/2021 0746 Last data filed at 05/17/2021 0453 Gross per 24 hour  Intake 1080 ml  Output 2975 ml  Net -1895 ml   Last 3 Weights 05/17/2021 05/16/2021 05/15/2021  Weight (lbs) 248 lb 1.6 oz 253 lb 256 lb  Weight (kg) 112.537 kg 114.76 kg 116.121 kg      Telemetry    Rate controlled AFL, rates ~ 105 with activity- Personally Reviewed  ECG    No new data- Personally Reviewed  Physical Exam   Gen:  No distress, Morbid Obesity   Neck: JVD to lower third of neck Cardiac: No Rubs or  Gallops, mechanical heart sounds regular rhythm and +2 radial pulses Respiratory: Clear to auscultation bilaterally, decreased effort is bases no tachypnea GI: Soft, nontender, non-distended  MS: Bilateral LE  edema;  moves all extremities Integument: Skin feels warm Neuro:  At time of evaluation, alert and oriented to person/place/time/situation  Psych: Normal affect, patient feels OK   Labs    High Sensitivity Troponin:  No results for input(s): TROPONINIHS in the last 720 hours.   Chemistry Recent Labs  Lab 05/12/21 1520 05/13/21 0518 05/14/21 0446 05/14/21 1017 05/14/21 1018 05/16/21 0220 05/17/21 0400  NA 141   < > 138   < > 143 139 139  K 3.9   < > 3.4*   < > 3.7 3.5 3.7  CL 104   < > 100  --   --  97* 95*  CO2 32   < > 31  --   --  37* 38*  GLUCOSE 84   < > 86  --   --  87 86  BUN 12   < > 10  --   --  16 14  CREATININE 0.78   < >  0.98  --   --  1.04* 1.02*  CALCIUM 9.3   < > 8.9  --   --  9.1 9.2  MG 1.8  --   --   --   --  1.7  --   PROT 6.7  --   --   --   --   --   --   ALBUMIN 3.4*  --   --   --   --   --   --   AST 24  --   --   --   --   --   --   ALT 11  --   --   --   --   --   --   ALKPHOS 49  --   --   --   --   --   --   BILITOT 1.2  --   --   --   --   --   --   GFRNONAA >60   < > >60  --   --  >60 >60  ANIONGAP 5   < > 7  --   --  5 6   < > = values in this interval not displayed.    Lipids No results for input(s): CHOL, TRIG, HDL, LABVLDL, LDLCALC, CHOLHDL in the last 168 hours.  Hematology Recent Labs  Lab 05/14/21 0446 05/14/21 1017 05/14/21 1018 05/15/21 0201 05/16/21 0220  WBC 6.6  --   --  7.2 6.7  RBC 3.36*  --   --  3.55* 3.14*  HGB 9.9*   < > 10.9* 10.4* 9.1*  HCT 32.8*   < > 32.0* 35.3* 31.0*  MCV 97.6  --   --  99.4 98.7  MCH 29.5  --   --  29.3 29.0  MCHC 30.2  --   --  29.5* 29.4*  RDW 17.9*  --   --  17.6* 17.6*  PLT 166  --   --  183 157   < > = values in this interval not displayed.   Thyroid  Recent Labs  Lab  05/13/21 0518  TSH 2.344    BNP Recent Labs  Lab 05/12/21 1520 05/16/21 0934  BNP 479.2* 218.2*    DDimer No results for input(s): DDIMER in the last 168 hours.   Radiology    DG Chest 1 View  Result Date: 05/16/2021 CLINICAL DATA:  Hypoxia, shortness of breath EXAM: CHEST  1 VIEW COMPARISON:  Chest radiograph 05/12/2021 FINDINGS: Median sternotomy wires and cardiac valve prostheses are again noted. The heart is enlarged, unchanged. The mediastinal contours are stable. There is vascular congestion without definite overt pulmonary edema. There is a small right pleural effusion with adjacent opacity, slightly improved in the interim. Blunting of the left costophrenic angle is unchanged, likely reflecting scarring. There is no pneumothorax. The bones are stable. IMPRESSION: Small right pleural effusion with adjacent right basilar opacity, slightly improved since 05/12/2021. Otherwise, no significant interval change in lung aeration. Electronically Signed   By: Valetta Mole M.D.   On: 05/16/2021 10:25     Patient Profile     60 y.o. 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  Was attempting transesophageal echo when noted to be severely hypoxic, chest x-ray revealed  pleural effusion, and she was admitted to the hospital for management of hypoxia and new pulmonary findings.  Right heart cath demonstrated severe pulmonary hypertension with PA systolic pressure 90 mmHg.  Pulmonary wedge pressure 24 mmHg.  Assessment & Plan    Mixed picture PH candidate related to OSA, OHS and Morbid Obesity, ILD and Pulmonary hemosiderosis AFL Severe AS moderate MS s/p Mechanical ST AVR and 25 mm ST MVR - will give one more day of Iv diuretics (X2), potential PO  transition 05/16/21; no standing diuretics ordered for 05/18/21 - See Dr. Thompson Caul note 05/15/21; no plans for TEE at this admission - INR goal has been set to 3.0 +/- 0.5  no ASA in the balance of her hemlyosis, two AVR and AFL - at home is on 2 L 02, still on 4 L with Sat of 90 (rechecked 05/17/21, but was unable to wean)  For questions or updates, please contact [place] Please consult [email or web address] for contact info under        Signed, Werner Lean, MD  05/17/2021, 7:46 AM

## 2021-05-17 NOTE — Progress Notes (Addendum)
ANTICOAGULATION CONSULT NOTE - Follow Up Consult  Pharmacy Consult for warfarin Indication: atrial fibrillation and mechanical valve  No Known Allergies  Patient Measurements: Height: 5\' 6"  (167.6 cm) Weight: 112.5 kg (248 lb 1.6 oz) IBW/kg (Calculated) : 59.3  Vital Signs: Temp: 97.6 F (36.4 C) (12/18 0444) Temp Source: Oral (12/18 0444) BP: 113/65 (12/18 0444) Pulse Rate: 73 (12/17 2155)  Labs: Recent Labs    05/14/21 1018 05/15/21 0201 05/16/21 0220 05/17/21 0400  HGB 10.9* 10.4* 9.1*  --   HCT 32.0* 35.3* 31.0*  --   PLT  --  183 157  --   LABPROT  --  26.9* 25.9* 24.4*  INR  --  2.5* 2.4* 2.2*  CREATININE  --   --  1.04* 1.02*     Estimated Creatinine Clearance: 73.7 mL/min (A) (by C-G formula based on SCr of 1.02 mg/dL (H)).   Medications:  Medications Prior to Admission  Medication Sig Dispense Refill Last Dose   acetaminophen (TYLENOL) 650 MG CR tablet Take 1,300 mg by mouth daily.   05/11/2021   allopurinol (ZYLOPRIM) 300 MG tablet Take 300 mg by mouth at bedtime.    Past Week   cetirizine (ZYRTEC) 10 MG tablet Take 10 mg by mouth daily.   05/11/2021   FLUoxetine (PROZAC) 20 MG capsule Take 20 mg by mouth daily.   05/11/2021   furosemide (LASIX) 40 MG tablet Take 40 mg by mouth daily.   05/11/2021   metoprolol tartrate (LOPRESSOR) 50 MG tablet Take 1 tablet (50 mg total) by mouth daily. 30 tablet 0 05/12/2021   montelukast (SINGULAIR) 10 MG tablet Take 10 mg by mouth daily.    05/11/2021   potassium chloride SA (KLOR-CON) 20 MEQ tablet Take 1 tablet (20 mEq total) by mouth daily. 60 tablet 0 05/11/2021   pravastatin (PRAVACHOL) 80 MG tablet Take 80 mg by mouth daily.    05/11/2021   warfarin (COUMADIN) 7.5 MG tablet Take 7.5mg  (1 full tablet) Monday, Wednesday, Friday. Take 3.75mg  (half a tablet) Saturday, Tuesday, Thursday, Sunday. INR check once a week (Patient taking differently: Take 3.75-7.5 mg by mouth See admin instructions. Take 7.5mg  (1 full  tablet) Monday and Friday. Take 3.75mg  (half a tablet) Saturday, Tuesday, Wednesday, Thursday, Sunday. INR check once a week) 90 tablet 1 05/11/2021   OXYGEN Inhale 2 L into the lungs daily as needed (SOB).       Assessment: 60 yo F with hx of mechanical mitral and aortic valves and afib presented 12/13 for planned TEE to evaluate MV, found to have with acute on chronic respiratory failure with hypoxia and to be admitted. Pharmacy consulted to dose warfarin inpatient. INR therapeutic on home regimen.   INR now below goal (2.2) continued on home dose of warfarin. Will bridge with lovenox until therapeutic. CBC stable   PTA warfarin dose: 7.5mg  on Mon/Fri, 3.75 mg all other days (last dose 12/12 PTA)  Goal of Therapy:  INR 2.5-3.5 Monitor platelets by anticoagulation protocol: Yes   Plan:  Start lovenox 113 mg (1 mg/kg) twice daily and continue until INR > 2.5 Give Warfarin 6 mg (home dose 3.75 mg) x1 today Continue daily INR Monitor CBC, s/sx of bleeding  Laurey Arrow, PharmD PGY1 Pharmacy Resident 05/17/2021  7:54 AM  Please check [email or web address] for unit-specific pharmacy phone numbers.

## 2021-05-18 ENCOUNTER — Inpatient Hospital Stay (HOSPITAL_COMMUNITY): Payer: Medicare Other

## 2021-05-18 ENCOUNTER — Ambulatory Visit: Payer: Medicare Other | Admitting: Interventional Cardiology

## 2021-05-18 DIAGNOSIS — D599 Acquired hemolytic anemia, unspecified: Secondary | ICD-10-CM

## 2021-05-18 DIAGNOSIS — G4733 Obstructive sleep apnea (adult) (pediatric): Secondary | ICD-10-CM

## 2021-05-18 DIAGNOSIS — I480 Paroxysmal atrial fibrillation: Secondary | ICD-10-CM

## 2021-05-18 DIAGNOSIS — I350 Nonrheumatic aortic (valve) stenosis: Secondary | ICD-10-CM

## 2021-05-18 LAB — BASIC METABOLIC PANEL
Anion gap: 9 (ref 5–15)
BUN: 18 mg/dL (ref 8–23)
CO2: 37 mmol/L — ABNORMAL HIGH (ref 22–32)
Calcium: 9.4 mg/dL (ref 8.9–10.3)
Chloride: 91 mmol/L — ABNORMAL LOW (ref 98–111)
Creatinine, Ser: 1.27 mg/dL — ABNORMAL HIGH (ref 0.44–1.00)
GFR, Estimated: 48 mL/min — ABNORMAL LOW (ref >60)
Glucose, Bld: 90 mg/dL (ref 70–99)
Potassium: 3.5 mmol/L (ref 3.5–5.1)
Sodium: 137 mmol/L (ref 135–145)

## 2021-05-18 LAB — CULTURE, BLOOD (ROUTINE X 2)
Culture: NO GROWTH
Culture: NO GROWTH
Special Requests: ADEQUATE
Special Requests: ADEQUATE

## 2021-05-18 LAB — PROTIME-INR
INR: 2.1 — ABNORMAL HIGH (ref 0.8–1.2)
Prothrombin Time: 23.9 seconds — ABNORMAL HIGH (ref 11.4–15.2)

## 2021-05-18 MED ORDER — WARFARIN SODIUM 7.5 MG PO TABS
7.5000 mg | ORAL_TABLET | Freq: Once | ORAL | Status: AC
  Administered 2021-05-18: 18:00:00 7.5 mg via ORAL
  Filled 2021-05-18: qty 1

## 2021-05-18 NOTE — Progress Notes (Signed)
NAME:  [name], MRN:  [id number], DOB:  Mar 06, 1961, LOS: 6 ADMISSION DATE:  05/12/2021, CONSULTATION DATE:  12/13 REFERRING MD:  Rogers Blocker, CHIEF COMPLAINT:  Dyspnea   History of Present Illness:  60 y/o female previously followed in the pulmonary clinic presented to [facility] after she was noted to be hypoxemic while waiting for a TEE to evaluate for mitral valve disease.  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  She underwent mitral valve and aortic valve replacement at [facility] and her lung disease stabilized, then after weight loss surgery in 2019 her chronic hypoxemic respiratory failure resolved and she was taken off oxygen.  Around this time she was also told that she no longer had sleep apnea and stopped using CPAP.    In the last few months she has had worsening fatigue and was found to have evidence of hemolysis without evidence of an underlying autoimmune process.  She was referred to cardiology for evaluation of her valvular heart disease.  They referred her for a TEE and while waiting she was noted to be hypoxemic.  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   2014 S/p mitral and aortic valve replacement 2015 Former smoker Asthma OSA Obesity, s/p weight laparoscopic gastric sleeve resection in 6270 complicated by hematoma Interior hematoma  Chronic respiratory failure with hypoxemia  Non-infectious lymphdema Gout Hypertension Hyperlipidemia   Significant Hospital Events: Including procedures, antibiotic start and stop dates in addition to other pertinent events   12/13 admission  Studies: 12/5 TTE > LVEF 35-00%, RV systolic function normal, severely elevated PA pressur, LA mod dilated, mech st jude mitral gradient 49mmgHg; mech AV valve with 75mmHg 12/15 RHC Right atrial pressure-16/21, mean 18  Right  ventricular pressure-91/9 Pulmonary artery pressure-90/41, mean 60 Pulmonary wedge pressure-A-wave 25, V  ave 20, mean 24 Cardiac output-7 L/min with an index of 3.2 L/min/m.  Interim History / Subjective:   Feels much better Has been diuresing well  Objective   Blood pressure 126/62, pulse 60, temperature 98.5 F (36.9 C), temperature source Oral, resp. rate 18, height 5\' 6"  (1.676 m), weight 111.1 kg, SpO2 90 %.        Intake/Output Summary (Last 24 hours) at 05/18/2021 1135 Last data filed at 05/18/2021 0454 Gross per 24 hour  Intake 600 ml  Output 2050 ml  Net -1450 ml   Filed Weights   05/16/21 0330 05/17/21 0452 05/18/21 0300  Weight: 114.8 kg 112.5 kg 111.1 kg    Examination:  General:  Resting comfortably in bed HENT: NCAT OP clear PULM: CTA B, normal effort CV: RRR, mechanical heart tounds GI: BS+, soft, nontender MSK: normal bulk and tone Neuro: awake, alert, no distress, MAEW    Resolved Hospital Problem list     Assessment & Plan:  Pulmonary hypertension with elevated wedge pressure Acute on chronic hypoxemic respiratory failure Acute heart failure due to aortic stenosis Obstructive sleep apnea 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  Will repeat HRCT to see if there is evidence of interstitial lung disease again similar to what she experienced years ago.  Plan: HRCT lungs now Wean off O2 for O2 saturation > 88% Needs outpatient sleep study again ambulate  Best Practice (right click and "Reselect all SmartList Selections"  daily)   Per [facility]  Labs   CBC: Recent Labs  Lab 05/12/21 1520 05/13/21 0518 05/14/21 0446 05/14/21 1017 05/14/21 1018 05/15/21 0201 05/16/21 0220  WBC 6.8 7.3 6.6  --   --  7.2 6.7  NEUTROABS 5.1  --   --   --   --   --  4.5  HGB 10.7* 10.0* 9.9* 10.5* 10.9* 10.4* 9.1*  HCT 35.4*  32.7* 32.8* 31.0* 32.0* 35.3* 31.0*  MCV 99.2 97.9 97.6  --   --  99.4 98.7  PLT 191 182 166  --   --  183 110    Basic Metabolic Panel: Recent Labs  Lab 05/12/21 1520 05/13/21 0518 05/14/21 0446 05/14/21 1017 05/14/21 1018 05/16/21 0220 05/17/21 0400 05/18/21 0122  NA 141 140 138 143 143 139 139 137  K 3.9 3.1* 3.4* 3.6 3.7 3.5 3.7 3.5  CL 104 102 100  --   --  97* 95* 91*  CO2 32 29 31  --   --  37* 38* 37*  GLUCOSE 84 87 86  --   --  87 86 90  BUN 12 10 10   --   --  16 14 18   CREATININE 0.78 0.86 0.98  --   --  1.04* 1.02* 1.27*  CALCIUM 9.3 9.1 8.9  --   --  9.1 9.2 9.4  MG 1.8  --   --   --   --  1.7  --   --    GFR: Estimated Creatinine Clearance: 58.7 mL/min (A) (by C-G formula based on SCr of 1.27 mg/dL (H)). Recent Labs  Lab 05/12/21 1520 05/13/21 0518 05/14/21 0446 05/15/21 0201 05/16/21 0220  PROCALCITON <0.10 <0.10 <0.10  --   --   WBC 6.8 7.3 6.6 7.2 6.7    Liver Function Tests: Recent Labs  Lab 05/12/21 1520  AST 24  ALT 11  ALKPHOS 49  BILITOT 1.2  PROT 6.7  ALBUMIN 3.4*   No results for input(s): LIPASE, AMYLASE in the last 168 hours. No results for input(s): AMMONIA in the last 168 hours.  ABG    Component Value Date/Time   PHART 7.424 05/15/2021 1814   PCO2ART 52.6 (H) 05/15/2021 1814   PO2ART 59.1 (L) 05/15/2021 1814   HCO3 33.8 (H) 05/15/2021 1814   TCO2 38 (H) 05/14/2021 1018   ACIDBASEDEF 1.1 02/03/2018 1938   O2SAT 90.6 05/15/2021 1814     Coagulation Profile: Recent Labs  Lab 05/14/21 0446 05/15/21 0201 05/16/21 0220 05/17/21 0400 05/18/21 0122  INR 2.8* 2.5* 2.4* 2.2* 2.1*    Cardiac Enzymes: No results for input(s): CKTOTAL, CKMB, CKMBINDEX, TROPONINI in the last 168 hours.  HbA1C: No results found for: HGBA1C  CBG: No results for input(s): GLUCAP in the last 168 hours.  Critical care time: n/a      Roselie Awkward, MD [facility] Pager: [phone] Cell: [phone] After 7:00 pm call Elink   [phone]

## 2021-05-18 NOTE — Progress Notes (Addendum)
ANTICOAGULATION CONSULT NOTE - Follow Up Consult  Pharmacy Consult for warfarin Indication: atrial fibrillation and mechanical valve  No Known Allergies  Patient Measurements: Height: 5\' 6"  (167.6 cm) Weight: 111.1 kg (244 lb 14.9 oz) IBW/kg (Calculated) : 59.3  Vital Signs: Temp: 97.8 F (36.6 C) (12/19 1135) Temp Source: Oral (12/19 1135) BP: 132/74 (12/19 1135) Pulse Rate: 63 (12/19 1135)  Labs: Recent Labs    05/16/21 0220 05/17/21 0400 05/18/21 0122  HGB 9.1*  --   --   HCT 31.0*  --   --   PLT 157  --   --   LABPROT 25.9* 24.4* 23.9*  INR 2.4* 2.2* 2.1*  CREATININE 1.04* 1.02* 1.27*     Estimated Creatinine Clearance: 58.7 mL/min (A) (by C-G formula based on SCr of 1.27 mg/dL (H)).   Medications:  Medications Prior to Admission  Medication Sig Dispense Refill Last Dose   acetaminophen (TYLENOL) 650 MG CR tablet Take 1,300 mg by mouth daily.   05/11/2021   allopurinol (ZYLOPRIM) 300 MG tablet Take 300 mg by mouth at bedtime.    Past Week   cetirizine (ZYRTEC) 10 MG tablet Take 10 mg by mouth daily.   05/11/2021   FLUoxetine (PROZAC) 20 MG capsule Take 20 mg by mouth daily.   05/11/2021   furosemide (LASIX) 40 MG tablet Take 40 mg by mouth daily.   05/11/2021   metoprolol tartrate (LOPRESSOR) 50 MG tablet Take 1 tablet (50 mg total) by mouth daily. 30 tablet 0 05/12/2021   montelukast (SINGULAIR) 10 MG tablet Take 10 mg by mouth daily.    05/11/2021   potassium chloride SA (KLOR-CON) 20 MEQ tablet Take 1 tablet (20 mEq total) by mouth daily. 60 tablet 0 05/11/2021   pravastatin (PRAVACHOL) 80 MG tablet Take 80 mg by mouth daily.    05/11/2021   warfarin (COUMADIN) 7.5 MG tablet Take 7.5mg  (1 full tablet) Monday, Wednesday, Friday. Take 3.75mg  (half a tablet) Saturday, Tuesday, Thursday, Sunday. INR check once a week (Patient taking differently: Take 3.75-7.5 mg by mouth See admin instructions. Take 7.5mg  (1 full tablet) Monday and Friday. Take 3.75mg  (half a  tablet) Saturday, Tuesday, Wednesday, Thursday, Sunday. INR check once a week) 90 tablet 1 05/11/2021   OXYGEN Inhale 2 L into the lungs daily as needed (SOB).       Assessment: 60 yo F with hx of mechanical mitral and aortic valves and afib presented 12/13 for planned TEE to evaluate MV, found to have with acute on chronic respiratory failure with hypoxia and to be admitted. Pharmacy consulted to dose warfarin inpatient. INR was therapeutic on admission 12/13 (Goal INR 2.5-3.5) on home regimen.  PTA warfarin dose: 7.5mg  on Mon/Fri, 3.75 mg all other days (last dose 12/12 PTA)  INR  has trended down below goal thus MD started bridge on 12/18 with lovenox until INR therapeutic.  Goal INR is 2.5-3.5 for mechanical MVR.  Today INR = 2.1,  trending down on home dose, no missed doses noted here and last took home/outpatient dose on 12/12 prior to admission.  Goal INR 2.5-3.5.   Continues on Lovenox bridge 113 mg sq q12h until INR at goal.  Weight =  116 kg>111 kg SCr bumped up to 1.27  ( 0.7s-0.9s>> 1.04>1.02> 1.27) CrCl is 58.7 ml/min.  H/o CKD stage III.  CBC stable as of 12/17,no bleeding noted.  Goal of Therapy:  INR 2.5-3.5 Monitor platelets by anticoagulation protocol: Yes   Plan:  Continue Lovenox 113 mg (~  1 mg/kg) twice daily and continue until INR > 2.5 Give Warfarin 7.5 mg x1 today Continue daily INR Monitor for s/sx of bleeding Monitor weekly SCr, CBC due 12/20, while on full dose lovenox.   Nicole Cella, RPh Clinical Pharmacist [phone] 05/18/2021  11:40 AM  Please check [email or web address] for unit-specific pharmacy phone numbers.

## 2021-05-18 NOTE — Care Management Important Message (Signed)
Important Message  Patient Details  Name: [name] MRN: [id number] Date of Birth: 1960/08/10   Medicare Important Message Given:  Yes     Shelda Altes 05/18/2021, 9:43 AM

## 2021-05-18 NOTE — Progress Notes (Signed)
Mobility Specialist Progress Note:   05/18/21 1135  Therapy Vitals  Temp 97.8 F (36.6 C)  Temp Source Oral  Pulse Rate 63  Resp 18  BP 132/74  Patient Position (if appropriate) Sitting  Oxygen Therapy  SpO2 92 %  Mobility  Activity Ambulated in hall  Level of Assistance Modified independent, requires aide device or extra time  Assistive Device None  Distance Ambulated (ft) 280 ft  Mobility Ambulated with assistance in hallway  Mobility Response Tolerated well  Mobility performed by Mobility specialist  $Mobility charge 1 Mobility   Pt received in bed willing to participate in mobility. No complaints of pain. Pt a little SOB requiring a couple standing rest breaks. Pt returned to EOB with call bell in reach and all needs met.   [facility] Public librarian Phone [phone] Secondary Phone [phone]

## 2021-05-18 NOTE — Progress Notes (Signed)
PROGRESS NOTE  [name] QAS:[id number] DOB: 07/08/1960 DOA: 05/12/2021 PCP: [name], FNP  Brief History   [name] is a 60 y.o. female with medical history significant of  valvular heart disease with 25 mm mechanical mitral valve and 19 mm mechanical aortic valve replacements, chronic respiratory failure on 2L [place], idiopathic pulmonary hemosiderosis, ILD, polycythemia - requiring phlebotomies in the past, CKD, HTN, HLD, OSA and morbid obesity with subsequent bariatric surgery, gout, depression and PAF.   She was being seen today for a TEE to further evaluate her mitral valve and rule out regurgitation. When she came in for procedure she was found to be hypoxic to 75% on room air. She typically wears 2-2.5L [place], but was requiring 4L to maintain sats. CXR was obtained which showed new small right pleural effusion with adjacent right basilar atelectasis versus infiltrate.  With new requirement for increased oxygen and findings on chest x-ray Dr. Harriet Masson with cardiology asked for direct admission.   She tells me she has been feeling "weak" and fatigued x 2 months. She has also had shortness of breath x 2 months. She chronically wears 2-2.5L [place] at home which she states she put herself on "all the time" about 2 months ago, but prior to this was only used this as needed prior to this. She also tells me that if she doesn't wear oxygen she will typically be in the 70s, that is not abnormal for her.  No doctor has told her to wear oxygen. She denies any fever/chills, no chest pain or palpitations, no cough, no abdominal pain, N/V. She has had off and on leg swelling. She is not sure about weight gain. She has variable episodes of diarrhea, none currently. She denies any sick contacts.    She has had her flu shot this year. Past history of smoking, at least a PPD for 40+ years, no drinking. No environmental exposure.    ED Course: vitals: pulse ox: 75% on RA and placed on 4L [place] with increase  to 95%. Bp: 178/78, HR: 63, RR: 17. On 2L [place] at home. CXR obtained which showed new right pleural effusion with adjacent right basilar atelectasis vs. Infiltrate.  Dr. Harriet Masson with cardiology called and asked for direct admit.   No labs at time of direct admit. Labs reviewed from 12/9.  Hgb: 10.4 No wbc Potassium: 3.4  Hospital Course: The patient was admitted to a telemetry bed. Cardiology was consulted. The patient has been seen by [name] of cardiology. She was diuresed. She developed hypokalemia which was supplemented. [name] has added Wilder Glade or empagliflozin. He has recommended supplemental iron and transfusion s if necessary. He also recommends that the patient follow up with [facility] out of concern that the mechanical valves may be causing hemolysis. [name] states that the patient is not a candidate for repeat surgery.Potassium has been supplemented.  Pulmonology was consulted. Dr. Erin Fulling recommends HRCT when the patient is diuresed optimally. There are currently no plans for TEE as the patient is not a candidate for surgical intervention and there is no clear path to respond to any potential findings coming out of the procedure.  ABG from 05/15/2021 demonstrates alkolosis with is probably contraction alkalosis.  Consultants  Cardiology Pulmonology  Procedures  None  Antibiotics   Anti-infectives (From admission, onward)    None      Subjective  The patient is resting comfortably. No new complaints.   Objective   Vitals:  Vitals:   05/18/21  0446 05/18/21 1135  BP: 126/62 132/74  Pulse: 60 63  Resp: 18 18  Temp: 98.5 F (36.9 C) 97.8 F (36.6 C)  SpO2: 90% 92%    Exam:  Constitutional:  The patient is awake, alert, and oriented x 3. No acute distress. Respiratory:  No increased work of breathing.  No wheezes, rales, or rhonchi No tactile fremitus Cardiovascular:  Regular rate and rhythm No murmurs, ectopy, or gallups. No lateral PMI. No  thrills. Abdomen:  Abdomen is soft, non-tender, non-distended No hernias, masses, or organomegaly Normoactive bowel sounds.  Musculoskeletal:  No cyanosis, clubbing, or edema Skin:  No rashes, lesions, ulcers palpation of skin: no induration or nodules Neurologic:  CN 2-12 intact Sensation all 4 extremities intact Psychiatric:  Mental status Mood, affect appropriate Orientation to person, place, time  judgment and insight appear intact  I have personally reviewed the following:   Today's Data  Vitals  Lab Data  BMP CBC  Micro Data  Blood cultures x 2 - no growth  Imaging  CXR  Cardiology Data  EKG   Scheduled Meds:  allopurinol  300 mg Oral QHS   dapagliflozin propanediol  10 mg Oral Daily   enoxaparin (LOVENOX) injection  113 mg Subcutaneous Q12H   FLUoxetine  20 mg Oral Daily   loratadine  10 mg Oral QHS   metoprolol tartrate  50 mg Oral Daily   montelukast  10 mg Oral QHS   potassium chloride SA  20 mEq Oral Daily   pravastatin  80 mg Oral q1800   saccharomyces boulardii  250 mg Oral BID   sodium chloride flush  3 mL Intravenous Q12H   sodium chloride flush  3 mL Intravenous Q12H   warfarin  7.5 mg Oral ONCE-1600   Warfarin - Pharmacist Dosing Inpatient   Does not apply q1600   Continuous Infusions:  sodium chloride     sodium chloride      Principal Problem:   Acute on chronic respiratory failure with hypoxia (HCC) Active Problems:   Chronic asthmatic bronchitis (HCC)   Essential hypertension   Hyperlipidemia   ILD (interstitial lung disease) (HCC)   CKD (chronic kidney disease) stage 3, GFR 30-59 ml/min (HCC)   Obstructive sleep apnea   Chronic acquired lymphedema - Right lower extremity   PAF (paroxysmal atrial fibrillation) (HCC)   Gout   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HCC)   Hemochromatosis   Major depressive disorder, single episode   Acute on chronic diastolic CHF (congestive heart failure) (HCC)   Pulmonary HTN (HCC)    LOS: 6 days   A & P   Acute on chronic respiratory failure with hypoxia ([place]) Patient presenting for TEE and found to be hypoxic on room air to 75% and needing 4L [place]. She currently is only saturating 90% on 4L. CXR with atelectasis vs. Infiltrate. Cardiology cancelled procedure and we were asked for a direct admit -she appears to have been on oxygen for some time with 3L with exertion and 2L at rest.  -She has ILD/bronchitis with no f/u with pulm recently. ? If progressing disease -bnp is elevated with increased SOB and leg swelling and could have some acute on chronic CHF contributing -will treat above and wean back to baseline as tolerated.   Active Problems:   Acute on chronic diastolic CHF (congestive heart failure) (HCC) Due to aortic stenosis. -based off findings on clinical exam including bilateral leg edema worse than baseline, findings on CXR: of new small right pleural effusion, no overt  edema and elevated BNP suggests acute on chronic diastolic CHF exacerbation -echo done on 05/04/21: EF of 55-60%, normal LVF.diastolic parameters are indeterminate. Severely elevated pulmonary artery systolic pressure  -strict I/O and daily weights -Continue IV diuresis with 40mg  IV x one today and will see how she responds. Held oral lasix.  -telemetry  -Cardiology has been consulted.  Pt is negative 5994 cc in terms of fluid balance. -Lasix held today due to elevation of creatinine.  -Wean o2 maintaining SaO2 of 88%.  Pulmonary hypertension with elevated wedge pressure Noted. Diuresis to reduce preload.   PAF (paroxysmal atrial fibrillation) (HCC) Rate controlled today. Plans for cardioversion later this month  Continue eliquis, metoprolol and telemetry    Mechanical valves (aorta and mitral) Plans for TEE  to see if any issues with valves causing hemolytic anemia, but cancelled due to hypoxia.      ILD (interstitial lung disease)/Chronic asthmatic bronchitis ([place]) -per OV notes from pulm in 2019 she was on 3L  oxygen with walking and 2L at rest, sleeping with 2L.  -Pt to undergo HRCT today.   CKD (chronic kidney disease) stage 3, GFR 30-59 ml/min (HCC) Below baseline, continue to monitor. Creatinine 1.04   Essential hypertension Normotensive on home medication (metoprolol) and monitor  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HCC) Recently seen by hematology for new onset anemia and w/u reveals concerns for hemolytic anemia from mechanical valves  TEE cancelled for now. Hgb stable, improving continue to monitor.   Chronic acquired lymphedema - Right lower extremity Appears to be at her baseline   Pulmonary Hemochromatosis 1 abnormal H63D mutation. Stable. Will need to have follow up HRCT today.   Major depressive disorder, single episode Stable, continue prozac    Gout Stable, continue colchicine   Hyperlipidemia Continue pravachol    Obstructive sleep apnea  States she had a sleep study in 2019 and did not need a cpap machine. Will need another sleep study as outpatient. There is a sleep study from 2018 in chart Per pulm notes in 2019, wearing 2L [place] at night prior to admission   Body mass index is 43.9 kg/m.   I have seen and examined this patient myself. I have spent 30 minutes in her evaluation and care.   Level of care: Telemetry Medical DVT prophylaxis:  warfarin  Code Status:  Full - confirmed with patient Family Communication: sister at bedside: [name]  Disposition Plan:  The patient is from: home             Anticipated d/c is to: home              Requires inpatient hospitalization and believe she requires greater than 2 midnight stays due to increased need of oxygen with acute on chronic failure with hypoxia. She is at significant risk of worsening, requires constant monitoring, IV medication, imaging and assessment and MDM with specialists.   Namine Beahm, DO [facility] Direct contact: see [email or web address]  7PM-7AM contact night coverage as above 05/18/2021, 2:45 PM  LOS: 1  day

## 2021-05-18 NOTE — Plan of Care (Signed)
°  Problem: Activity: Goal: Capacity to carry out activities will improve Outcome: Progressing   Problem: Cardiac: Goal: Ability to achieve and maintain adequate cardiopulmonary perfusion will improve Outcome: Progressing   Problem: Education: Goal: Knowledge of General Education information will improve Description: Including pain rating scale, medication(s)/side effects and non-pharmacologic comfort measures Outcome: Progressing   Problem: Health Behavior/Discharge Planning: Goal: Ability to manage health-related needs will improve Outcome: Progressing   Problem: Clinical Measurements: Goal: Ability to maintain clinical measurements within normal limits will improve Outcome: Progressing Goal: Will remain free from infection Outcome: Progressing Goal: Diagnostic test results will improve Outcome: Progressing Goal: Respiratory complications will improve Outcome: Progressing Goal: Cardiovascular complication will be avoided Outcome: Progressing   Problem: Activity: Goal: Risk for activity intolerance will decrease Outcome: Progressing   Problem: Nutrition: Goal: Adequate nutrition will be maintained Outcome: Progressing   Problem: Coping: Goal: Level of anxiety will decrease Outcome: Progressing   Problem: Elimination: Goal: Will not experience complications related to bowel motility Outcome: Progressing Goal: Will not experience complications related to urinary retention Outcome: Progressing   Problem: Pain Managment: Goal: General experience of comfort will improve Outcome: Progressing   Problem: Safety: Goal: Ability to remain free from injury will improve Outcome: Progressing   Problem: Skin Integrity: Goal: Risk for impaired skin integrity will decrease Outcome: Progressing

## 2021-05-18 NOTE — Plan of Care (Signed)

## 2021-05-18 NOTE — Progress Notes (Addendum)
Progress Note  Patient Name: [name] Date of Encounter: 05/18/2021  Primary Cardiologist: Diana Grooms, MD   Subjective   Patient seen and examined at her bedside.  She was woken arrived.  Inpatient Medications    Scheduled Meds:  allopurinol  300 mg Oral QHS   dapagliflozin propanediol  10 mg Oral Daily   enoxaparin (LOVENOX) injection  113 mg Subcutaneous Q12H   FLUoxetine  20 mg Oral Daily   loratadine  10 mg Oral QHS   metoprolol tartrate  50 mg Oral Daily   montelukast  10 mg Oral QHS   potassium chloride SA  20 mEq Oral Daily   pravastatin  80 mg Oral q1800   saccharomyces boulardii  250 mg Oral BID   sodium chloride flush  3 mL Intravenous Q12H   sodium chloride flush  3 mL Intravenous Q12H   Warfarin - Pharmacist Dosing Inpatient   Does not apply q1600   Continuous Infusions:  sodium chloride     sodium chloride     PRN Meds: sodium chloride, sodium chloride, acetaminophen, loperamide, morphine injection, ondansetron (ZOFRAN) IV, sodium chloride flush, sodium chloride flush   Vital Signs    Vitals:   05/17/21 2000 05/17/21 2301 05/18/21 0300 05/18/21 0446  BP: 114/66   126/62  Pulse: 68 69  60  Resp: (!) 21 18  18   Temp: 98.3 F (36.8 C)   98.5 F (36.9 C)  TempSrc: Oral   Oral  SpO2:  94%  90%  Weight:   111.1 kg   Height:        Intake/Output Summary (Last 24 hours) at 05/18/2021 0912 Last data filed at 05/18/2021 0454 Gross per 24 hour  Intake 600 ml  Output 2500 ml  Net -1900 ml   Filed Weights   05/16/21 0330 05/17/21 0452 05/18/21 0300  Weight: 114.8 kg 112.5 kg 111.1 kg    Telemetry    Rate control atrial flutter- Personally Reviewed  ECG    None today- Personally Reviewed  Physical Exam   GEN: No acute distress.   Neck: No JVD Cardiac: RRR, mechanical click, rubs, or gallops.  Respiratory: Clear to auscultation bilaterally. GI: Soft, nontender, non-distended  MS: +1 bilateral lower extremity edema; No  deformity. Neuro:  Nonfocal  Psych: Normal affect   Labs    Chemistry Recent Labs  Lab 05/12/21 1520 05/13/21 0518 05/16/21 0220 05/17/21 0400 05/18/21 0122  NA 141   < > 139 139 137  K 3.9   < > 3.5 3.7 3.5  CL 104   < > 97* 95* 91*  CO2 32   < > 37* 38* 37*  GLUCOSE 84   < > 87 86 90  BUN 12   < > 16 14 18   CREATININE 0.78   < > 1.04* 1.02* 1.27*  CALCIUM 9.3   < > 9.1 9.2 9.4  PROT 6.7  --   --   --   --   ALBUMIN 3.4*  --   --   --   --   AST 24  --   --   --   --   ALT 11  --   --   --   --   ALKPHOS 49  --   --   --   --   BILITOT 1.2  --   --   --   --   GFRNONAA >60   < > >60 >60 48*  ANIONGAP 5   < > 5 6 9    < > = values in this interval not displayed.     Hematology Recent Labs  Lab 05/14/21 0446 05/14/21 1017 05/14/21 1018 05/15/21 0201 05/16/21 0220  WBC 6.6  --   --  7.2 6.7  RBC 3.36*  --   --  3.55* 3.14*  HGB 9.9*   < > 10.9* 10.4* 9.1*  HCT 32.8*   < > 32.0* 35.3* 31.0*  MCV 97.6  --   --  99.4 98.7  MCH 29.5  --   --  29.3 29.0  MCHC 30.2  --   --  29.5* 29.4*  RDW 17.9*  --   --  17.6* 17.6*  PLT 166  --   --  183 157   < > = values in this interval not displayed.    Cardiac EnzymesNo results for input(s): TROPONINI in the last 168 hours. No results for input(s): TROPIPOC in the last 168 hours.   BNP Recent Labs  Lab 05/12/21 1520 05/16/21 0934  BNP 479.2* 218.2*     DDimer No results for input(s): DDIMER in the last 168 hours.   Radiology    DG Chest 1 View  Result Date: 05/16/2021 CLINICAL DATA:  Hypoxia, shortness of breath EXAM: CHEST  1 VIEW COMPARISON:  Chest radiograph 05/12/2021 FINDINGS: Median sternotomy wires and cardiac valve prostheses are again noted. The heart is enlarged, unchanged. The mediastinal contours are stable. There is vascular congestion without definite overt pulmonary edema. There is a small right pleural effusion with adjacent opacity, slightly improved in the interim. Blunting of the left  costophrenic angle is unchanged, likely reflecting scarring. There is no pneumothorax. The bones are stable. IMPRESSION: Small right pleural effusion with adjacent right basilar opacity, slightly improved since 05/12/2021. Otherwise, no significant interval change in lung aeration. Electronically Signed   By: Valetta Mole M.D.   On: 05/16/2021 10:25    Cardiac Studies   Right heart catheterization May 14, 2021 HEMODYNAMICS:    Right atrial pressure-16/21, mean 18 Right ventricular pressure-91/9 Pulmonary artery pressure-90/41, mean 60 Pulmonary wedge pressure-A-wave 25, V wave 20, mean 24 Cardiac output-7 L/min with an index of 3.2 L/min/m.   IMPRESSION: [name] has severe pulmonary hypertension with elevated wedge pressures as well.  She will need additional diuresis.  Her sats were approximately 88% on 4 L of oxygen.  The right antecubital sheath was removed and pressure held.  The patient left lab in stable condition.  [name], the patient's attending cardiologist, was notified of these results.   Quay Burow. MD, [facility] 05/14/2021 10:29 AM    Patient Profile     60 y.o. 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  Was attempting transesophageal echo when noted to be severely hypoxic, chest x-ray revealed pleural effusion, and she was admitted to the hospital for management of hypoxia and new pulmonary findings.  Right heart cath demonstrated severe pulmonary hypertension with PA systolic pressure 90 mmHg.  Pulmonary wedge pressure 24 mmHg.  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  Her diuretics has been held which I agree with.  She could potentially be on every other day Lasix once she is discharged. Right heart catheterization showed evidence of severe elevated pulmonary artery pressures with severe pulmonary hypertension Wesam suspecting this is mixed.  She follows with pulmonary as well. She is rate controlled with atrial fibrillation we will continue the patient on warfarin as well as metoprolol. In terms of her mechanical valve work-up this has been placed on hold.  We will reconvene in the outpatient setting patient will also follow-up with her surgeons at [facility] as well. Her creatinine is slightly elevated compared to her baseline 1.27 today we will continue to monitor.  I recommend holding her Lasix for today. Consider titrating down the oxygen and have the patient walk. Continue warfarin to INR goal of 2.5-3.5.     For questions or updates, please contact [place] Please consult [email or web address] for contact info under Cardiology/STEMI.      Signed, Nathaneal Sommers, DO  05/18/2021, 9:12 AM

## 2021-05-19 ENCOUNTER — Telehealth: Payer: Self-pay

## 2021-05-19 DIAGNOSIS — I890 Lymphedema, not elsewhere classified: Secondary | ICD-10-CM

## 2021-05-19 LAB — CBC
HCT: 30.4 % — ABNORMAL LOW (ref 36.0–46.0)
Hemoglobin: 9.2 g/dL — ABNORMAL LOW (ref 12.0–15.0)
MCH: 29 pg (ref 26.0–34.0)
MCHC: 30.3 g/dL (ref 30.0–36.0)
MCV: 95.9 fL (ref 80.0–100.0)
Platelets: 174 10*3/uL (ref 150–400)
RBC: 3.17 MIL/uL — ABNORMAL LOW (ref 3.87–5.11)
RDW: 16.6 % — ABNORMAL HIGH (ref 11.5–15.5)
WBC: 4.9 10*3/uL (ref 4.0–10.5)
nRBC: 0 % (ref 0.0–0.2)

## 2021-05-19 LAB — PROTIME-INR
INR: 2.3 — ABNORMAL HIGH (ref 0.8–1.2)
Prothrombin Time: 25.3 seconds — ABNORMAL HIGH (ref 11.4–15.2)

## 2021-05-19 LAB — CREATININE, SERUM
Creatinine, Ser: 1.09 mg/dL — ABNORMAL HIGH (ref 0.44–1.00)
GFR, Estimated: 58 mL/min — ABNORMAL LOW (ref >60)

## 2021-05-19 MED ORDER — ENOXAPARIN SODIUM 120 MG/0.8ML IJ SOSY
110.0000 mg | PREFILLED_SYRINGE | Freq: Two times a day (BID) | INTRAMUSCULAR | 0 refills | Status: DC
Start: 2021-05-19 — End: 2021-09-01

## 2021-05-19 MED ORDER — FUROSEMIDE 40 MG PO TABS: 40.0000 mg | ORAL_TABLET | ORAL | 2 refills | Status: DC

## 2021-05-19 MED ORDER — WARFARIN SODIUM 3 MG PO TABS: 6.0000 mg | ORAL_TABLET | Freq: Once | ORAL | Status: DC

## 2021-05-19 MED ORDER — WARFARIN SODIUM 7.5 MG PO TABS: 7.5000 mg | ORAL_TABLET | Freq: Once | ORAL | Status: DC

## 2021-05-19 MED ORDER — ENOXAPARIN SODIUM 120 MG/0.8ML IJ SOSY
110.0000 mg | PREFILLED_SYRINGE | Freq: Two times a day (BID) | INTRAMUSCULAR | Status: DC
  Administered 2021-05-19: 09:00:00 110 mg via SUBCUTANEOUS
  Filled 2021-05-19 (×2): qty 0.74

## 2021-05-19 MED ORDER — DAPAGLIFLOZIN PROPANEDIOL 10 MG PO TABS: 10.0000 mg | ORAL_TABLET | Freq: Every day | ORAL | 3 refills | Status: DC

## 2021-05-19 MED ORDER — POTASSIUM CHLORIDE CRYS ER 20 MEQ PO TBCR: 20.0000 meq | EXTENDED_RELEASE_TABLET | ORAL | 0 refills | Status: DC

## 2021-05-19 MED ORDER — WARFARIN SODIUM 7.5 MG PO TABS: 3.7500 mg | ORAL_TABLET | ORAL | Status: DC

## 2021-05-19 NOTE — Progress Notes (Signed)
Mobility Specialist Progress Note: ° ° 05/19/21 1127  °Mobility  °Activity Ambulated in hall  °Level of Assistance Modified independent, requires aide device or extra time  °Assistive Device None  °Distance Ambulated (ft) 280 ft  °Mobility Ambulated with assistance in hallway  °Mobility Response Tolerated well  °Mobility performed by Mobility specialist  °$Mobility charge 1 Mobility  ° °Pt received in bed willing to participate in mobility. No complaints of pain. Required 1 standing rest break. Pt returned to EOB with call bell in reach and all needs met.  ° °[place] Day °Mobility Specialist °Primary Phone [phone] °Secondary Phone [phone] ° °

## 2021-05-19 NOTE — Progress Notes (Signed)
Progress Note  Patient Name: [name] Date of Encounter: 05/19/2021  Primary Cardiologist: Sinclair Grooms, MD   Subjective   Patient seen and examined at her bedside.  She was awake in bed when I arrived.  She offers no complaints at this time.  Inpatient Medications    Scheduled Meds:  allopurinol  300 mg Oral QHS   dapagliflozin propanediol  10 mg Oral Daily   enoxaparin (LOVENOX) injection  110 mg Subcutaneous Q12H   FLUoxetine  20 mg Oral Daily   loratadine  10 mg Oral QHS   metoprolol tartrate  50 mg Oral Daily   montelukast  10 mg Oral QHS   potassium chloride SA  20 mEq Oral Daily   pravastatin  80 mg Oral q1800   saccharomyces boulardii  250 mg Oral BID   sodium chloride flush  3 mL Intravenous Q12H   sodium chloride flush  3 mL Intravenous Q12H   warfarin  6 mg Oral ONCE-1600   Warfarin - Pharmacist Dosing Inpatient   Does not apply q1600   Continuous Infusions:  sodium chloride     sodium chloride     PRN Meds: sodium chloride, sodium chloride, acetaminophen, loperamide, morphine injection, ondansetron (ZOFRAN) IV, sodium chloride flush, sodium chloride flush   Vital Signs    Vitals:   05/18/21 1925 05/18/21 2321 05/19/21 0326 05/19/21 0808  BP: (!) 109/55  131/67 121/69  Pulse: 64 62 (!) 59 (!) 43  Resp: 15 16 14    Temp: 98.2 F (36.8 C)  97.6 F (36.4 C) 97.8 F (36.6 C)  TempSrc: Oral  Oral Oral  SpO2: 95% 96% 96% 97%  Weight:   110.5 kg   Height:        Intake/Output Summary (Last 24 hours) at 05/19/2021 1127 Last data filed at 05/19/2021 7322 Gross per 24 hour  Intake 1320 ml  Output 1300 ml  Net 20 ml   Filed Weights   05/17/21 0452 05/18/21 0300 05/19/21 0326  Weight: 112.5 kg 111.1 kg 110.5 kg    Telemetry    Rate control atrial fibrillation- Personally Reviewed  ECG    None today- Personally Reviewed  Physical Exam   GEN: No acute distress.   Neck: No JVD Cardiac: RRR, mechanical click, rubs, or gallops.   Respiratory: Clear to auscultation bilaterally. GI: Soft, nontender, non-distended  MS: +1 bilateral lower extremity edema; No deformity. Neuro:  Nonfocal  Psych: Normal affect   Labs    Chemistry Recent Labs  Lab 05/12/21 1520 05/13/21 0518 05/16/21 0220 05/17/21 0400 05/18/21 0122 05/19/21 0552  NA 141   < > 139 139 137  --   K 3.9   < > 3.5 3.7 3.5  --   CL 104   < > 97* 95* 91*  --   CO2 32   < > 37* 38* 37*  --   GLUCOSE 84   < > 87 86 90  --   BUN 12   < > 16 14 18   --   CREATININE 0.78   < > 1.04* 1.02* 1.27* 1.09*  CALCIUM 9.3   < > 9.1 9.2 9.4  --   PROT 6.7  --   --   --   --   --   ALBUMIN 3.4*  --   --   --   --   --   AST 24  --   --   --   --   --  ALT 11  --   --   --   --   --   ALKPHOS 49  --   --   --   --   --   BILITOT 1.2  --   --   --   --   --   GFRNONAA >60   < > >60 >60 48* 58*  ANIONGAP 5   < > 5 6 9   --    < > = values in this interval not displayed.     Hematology Recent Labs  Lab 05/15/21 0201 05/16/21 0220 05/19/21 0552  WBC 7.2 6.7 4.9  RBC 3.55* 3.14* 3.17*  HGB 10.4* 9.1* 9.2*  HCT 35.3* 31.0* 30.4*  MCV 99.4 98.7 95.9  MCH 29.3 29.0 29.0  MCHC 29.5* 29.4* 30.3  RDW 17.6* 17.6* 16.6*  PLT 183 157 174    Cardiac EnzymesNo results for input(s): TROPONINI in the last 168 hours. No results for input(s): TROPIPOC in the last 168 hours.   BNP Recent Labs  Lab 05/12/21 1520 05/16/21 0934  BNP 479.2* 218.2*     DDimer No results for input(s): DDIMER in the last 168 hours.   Radiology    CT Chest High Resolution  Addendum Date: 05/19/2021   ADDENDUM REPORT: 05/19/2021 08:28 ADDENDUM: Corrected report: Original report was generated with an error, impression point omitted in error and is as follows: Enlarged mediastinal lymph nodes which are possibly reactive, but greater than expected in size. Recommend follow-up chest CT in 3 months to ensure stability. These results will be called to the ordering clinician or  representative by the Radiologist Assistant, and communication documented in the PACS or [employer]. Electronically Signed   By: Yetta Glassman M.D.   On: 05/19/2021 08:28   Result Date: 05/19/2021 CLINICAL DATA:  Interstitial lung disease EXAM: CT CHEST WITHOUT CONTRAST TECHNIQUE: Multidetector CT imaging of the chest was performed following the standard protocol without intravenous contrast. High resolution imaging of the lungs, as well as inspiratory and expiratory imaging, was performed. COMPARISON:  None. FINDINGS: Cardiovascular: Cardiomegaly. No pericardial effusion. Coronary artery calcifications of the LAD and RCA. Prior aortic and mitral valve replacement atherosclerotic disease of the thoracic aorta. Dilated main pulmonary artery. Mediastinum/Nodes: Small hiatal hernia. Enlarged mediastinal lymph nodes. Reference subcarinal lymph node measuring 1.6 cm in short axis. Lungs/Pleura: Central airways are patent. Bilateral mosaic attenuation which does not appear to accentuate with expiration. Linear opacity of the right upper lobe and lingula, likely due to scarring or atelectasis. Trace right pleural effusion with adjacent subpleural opacities of the right lower. Bilateral solid pulmonary nodules largest is located in the right upper lobe measures 4 mm on series 4, image 78. No evidence traction bronchiectasis or honeycomb change. Upper Abdomen: Postsurgical changes of the stomach. No acute abnormality. Musculoskeletal: No chest wall mass or suspicious bone lesions identified. IMPRESSION: 1. Trace right pleural effusion. Mild subpleural opacities of the right lower lobe adjacent to the pleural effusion are favored to represent atelectasis, although interstitial lung abnormality cannot be excluded. Prone imaging could be helpful for further evaluation. 2. Bilateral mosaic attenuation, findings can be seen in the setting of pulmonary hypertension. 3. Bilateral solid pulmonary nodules, largest  measures 4 mm and is located in the right upper lobe. No follow-up needed if patient is low-risk. Non-contrast chest CT can be considered in 12 months if patient is high-risk. This recommendation follows the consensus statement: Guidelines for Management of Incidental Pulmonary Nodules Detected on CT Images:  From the Fleischner Society 2017; Radiology 2017; [phone]. 4. Coronary artery calcifications and aortic Atherosclerosis (ICD10-I70.0). Electronically Signed: By: Yetta Glassman M.D. On: 05/19/2021 08:10    Cardiac Studies   Right heart catheterization May 14, 2021 HEMODYNAMICS:    Right atrial pressure-16/21, mean 18 Right ventricular pressure-91/9 Pulmonary artery pressure-90/41, mean 60 Pulmonary wedge pressure-A-wave 25, V wave 20, mean 24 Cardiac output-7 L/min with an index of 3.2 L/min/m.   IMPRESSION: Ms. Vari has severe pulmonary hypertension with elevated wedge pressures as well.  She will need additional diuresis.  Her sats were approximately 88% on 4 L of oxygen.  The right antecubital sheath was removed and pressure held.  The patient left lab in stable condition.  [name], the patient's attending cardiologist, was notified of these results.   Quay Burow. MD, [facility] 05/14/2021 10:29 AM    Patient Profile     60 y.o. 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  Was attempting transesophageal echo when noted to be severely hypoxic, chest x-ray revealed pleural effusion, and she was admitted to the hospital for management of hypoxia and new pulmonary findings.  Right heart cath demonstrated severe pulmonary hypertension with PA systolic pressure 90 mmHg.  Pulmonary wedge pressure 24 mmHg.  Assessment & Plan    Severe pulmonary  hypertension which is likely mixed with group 3, group 2 and likely Group 1 Severe aortic stenosis, moderate mitral stenosis status post status post mechanical valve in the aortic and mitral position (ST AVR and 25 mm ST MVR) Chronic kidney disease stage III Acute on chronic respiratory failure with hypoxia-in the setting of IDL and bronchitis pulmonary following Acute on chronic diastolic heart failure-resolving Paroxysmal atrial fibrillation Hyperlipidemia Obstructive sleep apnea   She has improved tremendously back to baseline home oxygen.   She could potentially be on every other day Lasix 40 mg once she is discharged. Right heart catheterization showed evidence of severe elevated pulmonary artery pressures with severe pulmonary hypertension Wesam suspecting this is mixed.  She follows with pulmonary as well. She is rate controlled with atrial fibrillation we will continue the patient on warfarin as well as metoprolol. In terms of her mechanical valve work-up this has been placed on hold.  We will reconvene in the outpatient setting patient will also follow-up with her surgeons at [facility] as well. Her creatinine back to baseline.  Consider titrating down the oxygen and have the patient walk. Continue warfarin to INR goal of 2.5-3.5.    For questions or updates, please contact [place] Please consult [email or web address] for contact info under Cardiology/STEMI.      Signed, Berniece Salines, DO  05/19/2021, 11:27 AM

## 2021-05-19 NOTE — Telephone Encounter (Signed)
-----   Message from Juanito Doom, MD sent at 05/19/2021 10:23 AM EST ----- Hi,  Please arrange follow up with Dr. Silas Flood or an APP in 2-4 weeks. Reason: heart failure, history of OSA, chronic respiratory failure with hypoxemia.    [name]

## 2021-05-19 NOTE — Progress Notes (Signed)
NAME:  [name], MRN:  [id number], DOB:  [birth year], LOS: 7 ADMISSION DATE:  05/12/2021, CONSULTATION DATE:  12/13 REFERRING MD:  Rogers Blocker, CHIEF COMPLAINT:  Dyspnea   History of Present Illness:  60 y/o female previously followed in the pulmonary clinic presented to [facility] after she was noted to be hypoxemic while waiting for a TEE to evaluate for mitral valve disease.  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  She underwent mitral valve and aortic valve replacement at [facility] and her lung disease stabilized, then after weight loss surgery in 2019 her chronic hypoxemic respiratory failure resolved and she was taken off oxygen.  Around this time she was also told that she no longer had sleep apnea and stopped using CPAP.    In the last few months she has had worsening fatigue and was found to have evidence of hemolysis without evidence of an underlying autoimmune process.  She was referred to cardiology for evaluation of her valvular heart disease.  They referred her for a TEE and while waiting she was noted to be hypoxemic.  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   2014 S/p mitral and aortic valve replacement 2015 Former smoker Asthma OSA Obesity, s/p weight laparoscopic gastric sleeve resection in 0865 complicated by hematoma Interior hematoma  Chronic respiratory failure with hypoxemia  Non-infectious lymphdema Gout Hypertension Hyperlipidemia   Significant Hospital Events: Including procedures, antibiotic start and stop dates in addition to other pertinent events   12/13 admission  Studies: 12/5 TTE > LVEF 78-46%, RV systolic function normal, severely elevated PA pressur, LA mod dilated, mech st jude mitral gradient 56mmgHg; mech AV valve with 78mmHg 12/15 RHC Right atrial pressure-16/21, mean 18  Right  ventricular pressure-91/9 Pulmonary artery pressure-90/41, mean 60 Pulmonary wedge pressure-A-wave 25, V  ave 20, mean 24 Cardiac output-7 L/min with an index of 3.2 L/min/m. 12/19 HRCT > increased caliber of pumonary arteries, trace R pleural effusion, some mosaicism, slight interlobular septal thickening bases, personally reviewed, mediastinal adenopathy, small nodules  Interim History / Subjective:   Feels fine Remains on 4 L [place]  Objective   Blood pressure 121/69, pulse (!) 43, temperature 97.8 F (36.6 C), temperature source Oral, resp. rate 14, height 5\' 6"  (1.676 m), weight 110.5 kg, SpO2 97 %.        Intake/Output Summary (Last 24 hours) at 05/19/2021 9629 Last data filed at 05/19/2021 5284 Gross per 24 hour  Intake 1320 ml  Output 1300 ml  Net 20 ml   Filed Weights   05/17/21 0452 05/18/21 0300 05/19/21 0326  Weight: 112.5 kg 111.1 kg 110.5 kg    Examination:  General:  Resting comfortably in bed HENT: NCAT OP clear PULM: Crackles R base B, normal effort CV: RRR, mechanical S1/2 GI: BS+, soft, nontender MSK: normal bulk and tone Derm: chronic lymphedema changes right leg, left leg with minimal swelling  Neuro: awake, alert, no distress, MAEW   Resolved Hospital Problem list     Assessment & Plan:  Pulmonary hypertension with elevated wedge pressure Acute on chronic hypoxemic respiratory failure 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  OK for d/c home from my perspective.  Her HRCT looks more like pulmonary venous hypertension to me (trace effusion, interlobular septal thickening, mediastinal adenopathy, enlarged pulmonary arteries) than anything else, so agree with diuresis as renal function allows.  With the hemolysis and worsening  hypoxemia I worry this picture is consistent with the presentation of her valvular disease in 2014.  Consider re-evaluation by [facility] cardiothoracic surgery sooner rather than later.    Plan: Wean off O2 for O2 saturation > 88% Will need outpatient pulmonary follow up with Dr. Silas Flood, we are arranging that now Needs outpatient sleep study  Could go home on oxygen from our standpoint today Ambulate  PCCM will sign off  Best Practice (right click and "Reselect all SmartList Selections" daily)   Per [facility]  Labs   CBC: Recent Labs  Lab 05/12/21 1520 05/13/21 0518 05/14/21 0446 05/14/21 1017 05/14/21 1018 05/15/21 0201 05/16/21 0220 05/19/21 0552  WBC 6.8 7.3 6.6  --   --  7.2 6.7 4.9  NEUTROABS 5.1  --   --   --   --   --  4.5  --   HGB 10.7* 10.0* 9.9* 10.5* 10.9* 10.4* 9.1* 9.2*  HCT 35.4* 32.7* 32.8* 31.0* 32.0* 35.3* 31.0* 30.4*  MCV 99.2 97.9 97.6  --   --  99.4 98.7 95.9  PLT 191 182 166  --   --  183 157 025    Basic Metabolic Panel: Recent Labs  Lab 05/12/21 1520 05/13/21 0518 05/14/21 0446 05/14/21 1017 05/14/21 1018 05/16/21 0220 05/17/21 0400 05/18/21 0122 05/19/21 0552  NA 141 140 138 143 143 139 139 137  --   K 3.9 3.1* 3.4* 3.6 3.7 3.5 3.7 3.5  --   CL 104 102 100  --   --  97* 95* 91*  --   CO2 32 29 31  --   --  37* 38* 37*  --   GLUCOSE 84 87 86  --   --  87 86 90  --   BUN 12 10 10   --   --  16 14 18   --   CREATININE 0.78 0.86 0.98  --   --  1.04* 1.02* 1.27* 1.09*  CALCIUM 9.3 9.1 8.9  --   --  9.1 9.2 9.4  --   MG 1.8  --   --   --   --  1.7  --   --   --    GFR: Estimated Creatinine Clearance: 68.3 mL/min (A) (by C-G formula based on SCr of 1.09 mg/dL (H)). Recent Labs  Lab 05/12/21 1520 05/13/21 0518 05/14/21 0446 05/15/21 0201 05/16/21 0220 05/19/21 0552  PROCALCITON <0.10 <0.10 <0.10  --   --   --   WBC 6.8 7.3 6.6 7.2 6.7 4.9    Liver Function Tests: Recent Labs  Lab 05/12/21 1520  AST 24  ALT 11  ALKPHOS 49  BILITOT  1.2  PROT 6.7  ALBUMIN 3.4*   No results for input(s): LIPASE, AMYLASE in the last 168 hours. No results for input(s): AMMONIA in the last 168 hours.  ABG    Component Value Date/Time   PHART 7.424 05/15/2021 1814   PCO2ART 52.6 (H) 05/15/2021 1814   PO2ART 59.1 (L) 05/15/2021 1814   HCO3 33.8 (H) 05/15/2021 1814   TCO2 38 (H) 05/14/2021 1018   ACIDBASEDEF 1.1 02/03/2018 1938   O2SAT 90.6 05/15/2021 1814     Coagulation Profile: Recent Labs  Lab 05/15/21 0201 05/16/21 0220 05/17/21 0400 05/18/21 0122 05/19/21 4270  INR 2.5* 2.4* 2.2* 2.1* 2.3*    Cardiac Enzymes: No results for input(s): CKTOTAL, CKMB, CKMBINDEX, TROPONINI in the last 168 hours.  HbA1C: No results found for: HGBA1C  CBG: No results for input(s): GLUCAP in the last 168 hours.  Critical care time: n/a      Roselie Awkward, MD [place] [facility] Pager: [phone] Cell: [phone] After 7:00 pm call Elink  [phone]

## 2021-05-19 NOTE — Progress Notes (Signed)
ANTICOAGULATION CONSULT NOTE - Follow Up Consult  Pharmacy Consult for warfarin Indication: atrial fibrillation and mechanical valve  No Known Allergies  Patient Measurements: Height: 5\' 6"  (167.6 cm) Weight: 110.5 kg (243 lb 9.6 oz) IBW/kg (Calculated) : 59.3  Vital Signs: Temp: 97.8 F (36.6 C) (12/20 0808) Temp Source: Oral (12/20 0808) BP: 121/69 (12/20 0808) Pulse Rate: 43 (12/20 0808)  Labs: Recent Labs    05/17/21 0400 05/18/21 0122 05/19/21 0552  HGB  --   --  9.2*  HCT  --   --  30.4*  PLT  --   --  174  LABPROT 24.4* 23.9* 25.3*  INR 2.2* 2.1* 2.3*  CREATININE 1.02* 1.27* 1.09*     Estimated Creatinine Clearance: 68.3 mL/min (A) (by C-G formula based on SCr of 1.09 mg/dL (H)).   Medications:  Medications Prior to Admission  Medication Sig Dispense Refill Last Dose   acetaminophen (TYLENOL) 650 MG CR tablet Take 1,300 mg by mouth daily.   05/11/2021   allopurinol (ZYLOPRIM) 300 MG tablet Take 300 mg by mouth at bedtime.    Past Week   cetirizine (ZYRTEC) 10 MG tablet Take 10 mg by mouth daily.   05/11/2021   FLUoxetine (PROZAC) 20 MG capsule Take 20 mg by mouth daily.   05/11/2021   furosemide (LASIX) 40 MG tablet Take 40 mg by mouth daily.   05/11/2021   metoprolol tartrate (LOPRESSOR) 50 MG tablet Take 1 tablet (50 mg total) by mouth daily. 30 tablet 0 05/12/2021   montelukast (SINGULAIR) 10 MG tablet Take 10 mg by mouth daily.    05/11/2021   potassium chloride SA (KLOR-CON) 20 MEQ tablet Take 1 tablet (20 mEq total) by mouth daily. 60 tablet 0 05/11/2021   pravastatin (PRAVACHOL) 80 MG tablet Take 80 mg by mouth daily.    05/11/2021   warfarin (COUMADIN) 7.5 MG tablet Take 7.5mg  (1 full tablet) Monday, Wednesday, Friday. Take 3.75mg  (half a tablet) Saturday, Tuesday, Thursday, Sunday. INR check once a week (Patient taking differently: Take 3.75-7.5 mg by mouth See admin instructions. Take 7.5mg  (1 full tablet) Monday and Friday. Take 3.75mg  (half a  tablet) Saturday, Tuesday, Wednesday, Thursday, Sunday. INR check once a week) 90 tablet 1 05/11/2021   OXYGEN Inhale 2 L into the lungs daily as needed (SOB).       Assessment: 60 yo F with hx of mechanical mitral and mechanical aortic valves (2016) and afib, who presented 12/13 for planned TEE to evaluate MV, found to have with acute on chronic respiratory failure with hypoxia and to be admitted. Pharmacy consulted to dose warfarin inpatient. INR was therapeutic on admission 12/13 (Goal INR 2.5-3.5) on home regimen.  PTA warfarin dose: 7.5mg  on Mon/Fri, 3.75 mg all other days (last dose 12/12 PTA)  INR trended down below goal thus MD started bridge on 12/18 with lovenox 1mg /kg SQ q12 hour until INR therapeutic.  Goal INR is 2.5-3.5 for mechanical MVR and PAF.   Today INR = 2.3,  increased today after doses higher that her prior to admit doses given over last 3 days.  No missed doses noted here and last took home/outpatient dose on 12/12 prior to her 12/13 admit date.  SCr 1.09, was 0.7s-9 range first few days of admission.   CrCl >30 ml/min.  H/o CKD stage III.  CBC stable with Hgb 9.2, ptlc is within normal limits. No bleeding noted.  Goal of Therapy:  INR 2.5-3.5 Monitor platelets by anticoagulation protocol: Yes  Plan:  Adjust Lovenox to 110 mg (1 mg/kg) twice daily  (weight 110 kg) and continue until  lovenox bridge INR > or = 2.5,   Give Warfarin 6 mg x1 today Continue daily INR Monitor for s/sx of bleeding Monitor weekly SCr, CBC d qTues while on lovenox.   Nicole Cella, RPh Clinical Pharmacist [phone] 05/19/2021  8:19 AM  Please check [email or web address] for unit-specific pharmacy phone numbers.

## 2021-05-19 NOTE — Discharge Summary (Signed)
Physician Discharge Summary   LILYGRACE RODICK TTS:[id number] DOB: [birth year] DOA: 05/12/2021  PCP: [name], FNP  Admit date: 05/12/2021 Discharge date: 05/19/2021   Admitted From: home Disposition:  home Discharging physician: Dwyane Dee, MD  Recommendations for Outpatient Follow-up:  Follow up with pulmonology Follow up with [place] cardiologist  Repeat INR at follow up  Home Health:  Equipment/Devices:   Discharge Condition: stable CODE STATUS: Full Diet recommendation:  Diet Orders (From admission, onward)     Start     Ordered   05/14/21 1044  Diet regular Room service appropriate? Yes; Fluid consistency: Thin  Diet effective now       Question Answer Comment  Room service appropriate? Yes   Fluid consistency: Thin      05/14/21 1043            Hospital Course:  Acute on chronic respiratory failure with hypoxia (HCC) Patient presenting for TEE and found to be hypoxic on room air to 75% and needing 4L [place].  - continue chronic O2 at discharge (on ~3L at baseline( - scheduled for follow up with pulmonology  Acute on chronic diastolic CHF (congestive heart failure) ([place]) Due to aortic stenosis. -Diuresed with Lasix during hospitalization - Followed by cardiology, appreciate assistance - Lasix continued every other day at discharge with QOD potassium as well   Pulmonary hypertension with elevated wedge pressure Noted. Diuresis to reduce preload.   PAF (paroxysmal atrial fibrillation) ([place]) -Possible cardioversion anticipated with cardiology outpatient Continue eliquis, metoprolol    Mechanical valves (AV and MV) -Subtherapeutic INR and patient being bridged with Lovenox - INR still not therapeutic at discharge but was improving.  She is given Lovenox to continue bridging and will have outpatient INR checked this Friday with [facility] [employer].  Patient understands and also plans to have her regular INR check next Tuesday as well - No reports  of bleeding nor history thereof - Continue Lovenox and Coumadin at discharge   ILD (interstitial lung disease)/Chronic asthmatic bronchitis ([place]) -per OV notes from pulm in 2019 she was on 3L oxygen with walking and 2L at rest, sleeping with 2L.   CKD (chronic kidney disease) stage 3, GFR 30-59 ml/min (HCC) Below baseline, continue to monitor. Creatinine 1.04   Essential hypertension Normotensive on home medication (metoprolol) and monitor   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HCC) Recently seen by hematology for new onset anemia and w/u reveals concerns for hemolytic anemia from mechanical valves  TEE cancelled for now. Hgb stable, improving continue to monitor.   Chronic acquired lymphedema - Right lower extremity Appears to be at her baseline   Pulmonary Hemochromatosis 1 abnormal H63D mutation. Stable - outpatient follow up with pulm    Major depressive disorder, single episode Stable, continue prozac    Gout Stable, continue colchicine   Hyperlipidemia Continue pravachol    Obstructive sleep apnea  States she had a sleep study in 2019 and did not need a cpap machine. Will need another sleep study as outpatient. There is a sleep study from 2018 in chart Per pulm notes in 2019, wearing 2L [place] at night prior to admission   Body mass index is 43.9 kg/m.   The patient's chronic medical conditions were treated accordingly per the patient's home medication regimen except as noted.  On day of discharge, patient was felt deemed stable for discharge. Patient/family member advised to call PCP or come back to ER if needed.   Principal Diagnosis: Acute on chronic respiratory failure with hypoxia (  [place])  Discharge Diagnoses: Principal Problem:   Acute on chronic respiratory failure with hypoxia (HCC) Active Problems:   Chronic asthmatic bronchitis (HCC)   Essential hypertension   Hyperlipidemia   ILD (interstitial lung disease) (HCC)   CKD (chronic kidney disease) stage 3, GFR 30-59 ml/min  (HCC)   Obstructive sleep apnea   Chronic acquired lymphedema - Right lower extremity   PAF (paroxysmal atrial fibrillation) (HCC)   Gout   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HCC)   Hemochromatosis   Major depressive disorder, single episode   Acute on chronic diastolic CHF (congestive heart failure) ([place])   Pulmonary HTN ([place])   Discharge Instructions     Discharge wound care:   Complete by: As directed    Clean left lower pannus with saline. Use skin barrier wipes around skin where irritated from tape. Cover with 4x4 gauze then ABD pad and secure with tape. Change daily.   Increase activity slowly   Complete by: As directed       Allergies as of 05/19/2021   No Known Allergies      Medication List     TAKE these medications    acetaminophen 650 MG CR tablet Commonly known as: TYLENOL Take 1,300 mg by mouth daily.   allopurinol 300 MG tablet Commonly known as: ZYLOPRIM Take 300 mg by mouth at bedtime.   cetirizine 10 MG tablet Commonly known as: ZYRTEC Take 10 mg by mouth daily.   dapagliflozin propanediol 10 MG Tabs tablet Commonly known as: FARXIGA Take 1 tablet (10 mg total) by mouth daily. Start taking on: May 20, 2021   enoxaparin 120 MG/0.8ML injection Commonly known as: LOVENOX Inject 0.74 mLs (110 mg total) into the skin every 12 (twelve) hours for 7 days.   FLUoxetine 20 MG capsule Commonly known as: PROZAC Take 20 mg by mouth daily.   furosemide 40 MG tablet Commonly known as: LASIX Take 1 tablet (40 mg total) by mouth every other day. What changed: when to take this   metoprolol tartrate 50 MG tablet Commonly known as: LOPRESSOR Take 1 tablet (50 mg total) by mouth daily.   montelukast 10 MG tablet Commonly known as: SINGULAIR Take 10 mg by mouth daily.   OXYGEN Inhale 2 L into the lungs daily as needed (SOB).   potassium chloride SA 20 MEQ tablet Commonly known as: KLOR-CON M Take 1 tablet (20 mEq total) by mouth every other day. What  changed: when to take this   pravastatin 80 MG tablet Commonly known as: PRAVACHOL Take 80 mg by mouth daily.   warfarin 7.5 MG tablet Commonly known as: COUMADIN Take as directed. If you are unsure how to take this medication, talk to your nurse or doctor. Original instructions: Take 0.5-1 tablets (3.75-7.5 mg total) by mouth See admin instructions. Take 7.5mg  (1 full tablet) Monday and Friday. Take 3.75mg  (half a tablet) Saturday, Tuesday, Wednesday, Thursday, Sunday. INR check once a week               Discharge Care Instructions  (From admission, onward)           Start     Ordered   05/19/21 0000  Discharge wound care:       Comments: Clean left lower pannus with saline. Use skin barrier wipes around skin where irritated from tape. Cover with 4x4 gauze then ABD pad and secure with tape. Change daily.   05/19/21 0956            Follow-up Information  [name], [place]. Go on 05/20/2021.   Specialty: Nurse Practitioner Why: @11 :30am Contact information: [place] [address] [phone]         Belva Crome, MD .   Specialty: Cardiology Contact information: [address] [address] [phone]         Freddi Starr, MD Follow up.   Specialty: Pulmonary Disease Why: We will call you with appt date and time Contact information: [address] [place] [address] [phone]         [place]. Go on 05/22/2021.   Specialty: Cardiology Why: 2:15 pm for INR check Contact information: [address] [place] [phone]               No Known Allergies  Consultations: Cardiology Pulmonology  Discharge Exam: BP 131/82 (BP Location: Left Arm)    Pulse 62    Temp 97.9 F (36.6 C) (Oral)    Resp 14    Ht 5\' 6"  (1.676 m)    Wt 110.5 kg    SpO2 97%    BMI 39.32 kg/m   Physical Exam Constitutional:      Appearance: Normal appearance.  HENT:     Head: Normocephalic and atraumatic.     Mouth/Throat:     Mouth: Mucous membranes are moist.  Eyes:     Extraocular Movements: Extraocular movements intact.  Cardiovascular:     Rate and Rhythm: Normal rate.  Pulmonary:     Breath sounds: Normal breath sounds.  Abdominal:     General: Bowel sounds are normal. There is no distension.     Palpations: Abdomen is soft.     Tenderness: There is no abdominal tenderness.  Musculoskeletal:        General: Normal range of motion.     Cervical back: Normal range of motion and neck supple.  Skin:    General: Skin is warm and dry.  Neurological:     General: No focal deficit present.     Mental Status: She is alert.  Psychiatric:        Mood and Affect: Mood normal.        Behavior: Behavior normal.     The results of significant diagnostics from this hospitalization (including imaging, microbiology, ancillary and laboratory) are listed below for reference.   Microbiology: Recent Results (from the past 240 hour(s))  Resp Panel by RT-PCR (Flu A&B, Covid) Nasopharyngeal Swab     Status: None   Collection Time: 05/12/21  3:20 PM   Specimen: Nasopharyngeal Swab; Nasopharyngeal(NP) swabs in vial transport medium  Result Value Ref Range Status   SARS Coronavirus 2 by RT PCR NEGATIVE NEGATIVE Final    Comment: (NOTE) SARS-CoV-2 target nucleic acids are NOT DETECTED.  The SARS-CoV-2 RNA is generally detectable in upper respiratory specimens during the acute phase of infection. The lowest concentration of SARS-CoV-2 viral copies this assay can detect is 138 copies/mL. A negative result does not preclude SARS-Cov-2 infection and should not be used as the sole basis for treatment or other patient management decisions. A negative result may occur with  improper specimen collection/handling, submission of specimen other than nasopharyngeal swab, presence of viral  mutation(s) within the areas targeted by this assay, and inadequate number of viral copies(<138 copies/mL). A negative result must be combined with clinical observations, patient history, and epidemiological information. The expected result is Negative.  Fact Sheet for Patients:  EntrepreneurPulse.com.au  Fact Sheet for Healthcare Providers:  IncredibleEmployment.be  This test is no t yet approved or cleared by the [place] FDA and  has been authorized for detection and/or diagnosis of SARS-CoV-2 by FDA under an Emergency Use Authorization (EUA). This EUA will remain  in effect (meaning this test can be used) for the duration of the COVID-19 declaration under Section 564(b)(1) of the Act, 21 U.S.C.section 360bbb-3(b)(1), unless the authorization is terminated  or revoked sooner.       Influenza A by PCR NEGATIVE NEGATIVE Final   Influenza B by PCR NEGATIVE NEGATIVE Final    Comment: (NOTE) The Xpert Xpress SARS-CoV-2/FLU/RSV plus assay is intended as an aid in the diagnosis of influenza from Nasopharyngeal swab specimens and should not be used as a sole basis for treatment. Nasal washings and aspirates are unacceptable for Xpert Xpress SARS-CoV-2/FLU/RSV testing.  Fact Sheet for Patients: EntrepreneurPulse.com.au  Fact Sheet for Healthcare Providers: IncredibleEmployment.be  This test is not yet approved or cleared by the [place] FDA and has been authorized for detection and/or diagnosis of SARS-CoV-2 by FDA under an Emergency Use Authorization (EUA). This EUA will remain in effect (meaning this test can be used) for the duration of the COVID-19 declaration under Section 564(b)(1) of the Act, 21 U.S.C. section 360bbb-3(b)(1), unless the authorization is terminated or revoked.  Performed at [facility], [place] [address], [place], [place] [address]   Culture, blood (Routine X 2) w  Reflex to ID Panel     Status: None   Collection Time: 05/13/21  5:18 AM   Specimen: BLOOD  Result Value Ref Range Status   Specimen Description BLOOD BLOOD RIGHT WRIST  Final   Special Requests   Final    BOTTLES DRAWN AEROBIC AND ANAEROBIC Blood Culture adequate volume   Culture   Final    NO GROWTH 5 DAYS Performed at [facility], [place] [address] [place], [place] [address]    Report Status 05/18/2021 FINAL  Final  Culture, blood (Routine X 2) w Reflex to ID Panel     Status: None   Collection Time: 05/13/21  5:18 AM   Specimen: BLOOD RIGHT HAND  Result Value Ref Range Status   Specimen Description BLOOD RIGHT HAND  Final   Special Requests   Final    BOTTLES DRAWN AEROBIC AND ANAEROBIC Blood Culture adequate volume   Culture   Final    NO GROWTH 5 DAYS Performed at [facility], [place] [address] [place], [place] [address]    Report Status 05/18/2021 FINAL  Final     Labs: BNP (last 3 results) Recent Labs    05/12/21 1520 05/16/21 0934  BNP 479.2* 094.7*   Basic Metabolic Panel: Recent Labs  Lab 05/13/21 0518 05/14/21 0446 05/14/21 1017 05/14/21 1018 05/16/21 0220 05/17/21 0400 05/18/21 0122 05/19/21 0552  NA 140 138 143 143 139 139 137  --   K 3.1* 3.4* 3.6 3.7 3.5 3.7 3.5  --   CL 102 100  --   --  97* 95* 91*  --   CO2 29 31  --   --  37* 38* 37*  --   GLUCOSE 87 86  --   --  87 86 90  --   BUN 10 10  --   --  16 14 18   --   CREATININE 0.86 0.98  --   --  1.04* 1.02* 1.27* 1.09*  CALCIUM 9.1 8.9  --   --  9.1 9.2 9.4  --   MG  --   --   --   --  1.7  --   --   --    Liver Function Tests: No results for input(s): AST, ALT, ALKPHOS, BILITOT, PROT, ALBUMIN in the last 168 hours. No results for input(s): LIPASE, AMYLASE in the last 168 hours. No results for input(s): AMMONIA in the last 168 hours. CBC: Recent Labs  Lab 05/13/21 0518 05/14/21 0446 05/14/21 1017 05/14/21 1018 05/15/21 0201 05/16/21 0220 05/19/21 0552  WBC 7.3 6.6  --    --  7.2 6.7 4.9  NEUTROABS  --   --   --   --   --  4.5  --   HGB 10.0* 9.9* 10.5* 10.9* 10.4* 9.1* 9.2*  HCT 32.7* 32.8* 31.0* 32.0* 35.3* 31.0* 30.4*  MCV 97.9 97.6  --   --  99.4 98.7 95.9  PLT 182 166  --   --  183 157 174   Cardiac Enzymes: No results for input(s): CKTOTAL, CKMB, CKMBINDEX, TROPONINI in the last 168 hours. BNP: Invalid input(s): POCBNP CBG: No results for input(s): GLUCAP in the last 168 hours. D-Dimer No results for input(s): DDIMER in the last 72 hours. Hgb A1c No results for input(s): HGBA1C in the last 72 hours. Lipid Profile No results for input(s): CHOL, HDL, LDLCALC, TRIG, CHOLHDL, LDLDIRECT in the last 72 hours. Thyroid function studies No results for input(s): TSH, T4TOTAL, T3FREE, THYROIDAB in the last 72 hours.  Invalid input(s): FREET3 Anemia work up No results for input(s): VITAMINB12, FOLATE, FERRITIN, TIBC, IRON, RETICCTPCT in the last 72 hours. Urinalysis    Component Value Date/Time   COLORURINE YELLOW 02/26/2021 1620   APPEARANCEUR CLEAR 02/26/2021 1620   LABSPEC 1.010 02/26/2021 1620   PHURINE 5.0 02/26/2021 1620   GLUCOSEU NEGATIVE 02/26/2021 1620   HGBUR SMALL (A) 02/26/2021 1620   BILIRUBINUR NEGATIVE 02/26/2021 1620   KETONESUR NEGATIVE 02/26/2021 1620   PROTEINUR 30 (A) 02/26/2021 1620   UROBILINOGEN 0.2 04/08/2015 0317   NITRITE NEGATIVE 02/26/2021 1620   LEUKOCYTESUR NEGATIVE 02/26/2021 1620   Sepsis Labs Invalid input(s): PROCALCITONIN,  WBC,  LACTICIDVEN Microbiology Recent Results (from the past 240 hour(s))  Resp Panel by RT-PCR (Flu A&B, Covid) Nasopharyngeal Swab     Status: None   Collection Time: 05/12/21  3:20 PM   Specimen: Nasopharyngeal Swab; Nasopharyngeal(NP) swabs in vial transport medium  Result Value Ref Range Status   SARS Coronavirus 2 by RT PCR NEGATIVE NEGATIVE Final    Comment: (NOTE) SARS-CoV-2 target nucleic acids are NOT DETECTED.  The SARS-CoV-2 RNA is generally detectable in upper  respiratory specimens during the acute phase of infection. The lowest concentration of SARS-CoV-2 viral copies this assay can detect is 138 copies/mL. A negative result does not preclude SARS-Cov-2 infection and should not be used as the sole basis for treatment or other patient management decisions. A negative result may occur with  improper specimen collection/handling, submission of specimen other than nasopharyngeal swab, presence of viral mutation(s) within the areas targeted by this assay, and inadequate number of viral copies(<138 copies/mL). A negative result must be combined with clinical observations, patient history, and epidemiological information. The expected result is Negative.  Fact Sheet for Patients:  EntrepreneurPulse.com.au  Fact Sheet for Healthcare Providers:  IncredibleEmployment.be  This test is no t yet approved or cleared by the [place] FDA and  has been authorized for detection and/or  diagnosis of SARS-CoV-2 by FDA under an Emergency Use Authorization (EUA). This EUA will remain  in effect (meaning this test can be used) for the duration of the COVID-19 declaration under Section 564(b)(1) of the Act, 21 U.S.C.section 360bbb-3(b)(1), unless the authorization is terminated  or revoked sooner.       Influenza A by PCR NEGATIVE NEGATIVE Final   Influenza B by PCR NEGATIVE NEGATIVE Final    Comment: (NOTE) The Xpert Xpress SARS-CoV-2/FLU/RSV plus assay is intended as an aid in the diagnosis of influenza from Nasopharyngeal swab specimens and should not be used as a sole basis for treatment. Nasal washings and aspirates are unacceptable for Xpert Xpress SARS-CoV-2/FLU/RSV testing.  Fact Sheet for Patients: EntrepreneurPulse.com.au  Fact Sheet for Healthcare Providers: IncredibleEmployment.be  This test is not yet approved or cleared by the [place] FDA and has been  authorized for detection and/or diagnosis of SARS-CoV-2 by FDA under an Emergency Use Authorization (EUA). This EUA will remain in effect (meaning this test can be used) for the duration of the COVID-19 declaration under Section 564(b)(1) of the Act, 21 U.S.C. section 360bbb-3(b)(1), unless the authorization is terminated or revoked.  Performed at [facility], [place] [address] [place], [place] [address]   Culture, blood (Routine X 2) w Reflex to ID Panel     Status: None   Collection Time: 05/13/21  5:18 AM   Specimen: BLOOD  Result Value Ref Range Status   Specimen Description BLOOD BLOOD RIGHT WRIST  Final   Special Requests   Final    BOTTLES DRAWN AEROBIC AND ANAEROBIC Blood Culture adequate volume   Culture   Final    NO GROWTH 5 DAYS Performed at [facility], [place] [address], [place], [place] [address]    Report Status 05/18/2021 FINAL  Final  Culture, blood (Routine X 2) w Reflex to ID Panel     Status: None   Collection Time: 05/13/21  5:18 AM   Specimen: BLOOD RIGHT HAND  Result Value Ref Range Status   Specimen Description BLOOD RIGHT HAND  Final   Special Requests   Final    BOTTLES DRAWN AEROBIC AND ANAEROBIC Blood Culture adequate volume   Culture   Final    NO GROWTH 5 DAYS Performed at [facility], [place] [address] [place], [place] [address]    Report Status 05/18/2021 FINAL  Final    Procedures/Studies: DG Chest 1 View  Result Date: 05/16/2021 CLINICAL DATA:  Hypoxia, shortness of breath EXAM: CHEST  1 VIEW COMPARISON:  Chest radiograph 05/12/2021 FINDINGS: Median sternotomy wires and cardiac valve prostheses are again noted. The heart is enlarged, unchanged. The mediastinal contours are stable. There is vascular congestion without definite overt pulmonary edema. There is a small right pleural effusion with adjacent opacity, slightly improved in the interim. Blunting of the left costophrenic angle is unchanged, likely reflecting scarring.  There is no pneumothorax. The bones are stable. IMPRESSION: Small right pleural effusion with adjacent right basilar opacity, slightly improved since 05/12/2021. Otherwise, no significant interval change in lung aeration. Electronically Signed   By: Valetta Mole M.D.   On: 05/16/2021 10:25   DG Chest 1 View  Result Date: 05/12/2021 CLINICAL DATA:  Hypoxia. EXAM: CHEST  1 VIEW COMPARISON:  Chest x-ray dated January 04, 2021. FINDINGS: Unchanged mild cardiomegaly status post aortic and mitral valve replacements. New small right pleural effusion with adjacent right basilar opacity. Chronic scarring at the left costophrenic angle with  similar postsurgical changes in the left lung. No pneumothorax. No acute osseous abnormality. IMPRESSION: 1. New small right pleural effusion with adjacent right basilar atelectasis versus infiltrate. Electronically Signed   By: Titus Dubin M.D.   On: 05/12/2021 13:29   CARDIAC CATHETERIZATION  Result Date: 05/14/2021 Images from the original result were not included. BEATA BEASON is a 60 y.o. female  [id number] LOCATION:  FACILITY: [facility] PHYSICIAN: Quay Burow, M.D. [birth year] DATE OF PROCEDURE:  05/14/2021 DATE OF DISCHARGE: Right Heart Cath History obtained from chart review.60 y.o. 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  Was attempting transesophageal echo when noted to be severely hypoxic, chest x-ray revealed pleural effusion, and she was admitted to the hospital for management of hypoxia and new pulmonary findings.  The patient has been diuresed but still remains hypoxic.  2D echo revealed preserved LV function with a normally functioning aortic and mitral valve mechanical prostheses.  She presents now for right heart cath to  define her physiology. HEMODYNAMICS:  Right atrial pressure-16/21, mean 18 Right ventricular pressure-91/9 Pulmonary artery pressure-90/41, mean 60 Pulmonary wedge pressure-A-wave 25, V wave 20, mean 24 Cardiac output-7 L/min with an index of 3.2 L/min/m.   Ms. Mcglynn has severe pulmonary hypertension with elevated wedge pressures as well.  She will need additional diuresis.  Her sats were approximately 88% on 4 L of oxygen.  The right antecubital sheath was removed and pressure held.  The patient left lab in stable condition.  [name], the patient's attending cardiologist, was notified of these results. Quay Burow. MD, [facility] 05/14/2021 10:29 AM    CT Chest High Resolution  Addendum Date: 05/19/2021   ADDENDUM REPORT: 05/19/2021 08:28 ADDENDUM: Corrected report: Original report was generated with an error, impression point omitted in error and is as follows: Enlarged mediastinal lymph nodes which are possibly reactive, but greater than expected in size. Recommend follow-up chest CT in 3 months to ensure stability. These results will be called to the ordering clinician or representative by the Radiologist Assistant, and communication documented in the PACS or [employer]. Electronically Signed   By: Yetta Glassman M.D.   On: 05/19/2021 08:28   Result Date: 05/19/2021 CLINICAL DATA:  Interstitial lung disease EXAM: CT CHEST WITHOUT CONTRAST TECHNIQUE: Multidetector CT imaging of the chest was performed following the standard protocol without intravenous contrast. High resolution imaging of the lungs, as well as inspiratory and expiratory imaging, was performed. COMPARISON:  None. FINDINGS: Cardiovascular: Cardiomegaly. No pericardial effusion. Coronary artery calcifications of the LAD and RCA. Prior aortic and mitral valve replacement atherosclerotic disease of the thoracic aorta. Dilated main pulmonary artery. Mediastinum/Nodes: Small hiatal hernia. Enlarged mediastinal lymph nodes.  Reference subcarinal lymph node measuring 1.6 cm in short axis. Lungs/Pleura: Central airways are patent. Bilateral mosaic attenuation which does not appear to accentuate with expiration. Linear opacity of the right upper lobe and lingula, likely due to scarring or atelectasis. Trace right pleural effusion with adjacent subpleural opacities of the right lower. Bilateral solid pulmonary nodules largest is located in the right upper lobe measures 4 mm on series 4, image 78. No evidence traction bronchiectasis or honeycomb change. Upper Abdomen: Postsurgical changes of the stomach. No acute abnormality. Musculoskeletal: No chest wall mass or suspicious bone lesions identified. IMPRESSION: 1. Trace right pleural effusion.  Mild subpleural opacities of the right lower lobe adjacent to the pleural effusion are favored to represent atelectasis, although interstitial lung abnormality cannot be excluded. Prone imaging could be helpful for further evaluation. 2. Bilateral mosaic attenuation, findings can be seen in the setting of pulmonary hypertension. 3. Bilateral solid pulmonary nodules, largest measures 4 mm and is located in the right upper lobe. No follow-up needed if patient is low-risk. Non-contrast chest CT can be considered in 12 months if patient is high-risk. This recommendation follows the consensus statement: Guidelines for Management of Incidental Pulmonary Nodules Detected on CT Images: From the Fleischner Society 2017; Radiology 2017; 284:228-243. 4. Coronary artery calcifications and aortic Atherosclerosis (ICD10-I70.0). Electronically Signed: By: Yetta Glassman M.D. On: 05/19/2021 08:10   ECHOCARDIOGRAM COMPLETE  Result Date: 05/04/2021    ECHOCARDIOGRAM REPORT   Patient Name:   SOFIA VANMETER Date of Exam: 05/04/2021 Medical Rec #:  [id number]         Height:       66.0 in Accession #:    [id number]        Weight:       269.8 lb Date of Birth:  08/04/1960          BSA:          2.271 m Patient Age:     19 years          BP:           139/76 mmHg Patient Gender: F                 HR:           64 bpm. Exam Location:  [facility] Procedure: 2D Echo, Cardiac Doppler and Color Doppler Indications:    I35.9 Aortic Valve Disorder  History:        Patient has prior history of Echocardiogram examinations, most                 recent 07/17/2019. CHF, Aortic Valve Disease and Mitral Valve                 Disease, Arrythmias:Atrial Flutter, Signs/Symptoms:Dyspnea and                 Murmur; Risk Factors:Hypertension, Dyslipidemia, Family History                 of Coronary Artery Disease and Former Smoker. Aortic Valve                 Replacement (2016, 108mm St[employer] Jude Mechanical), Mitral Valve                 Repalcement (2016, 63mm St[employer] Jude Mechanical).  Sonographer:    Deliah Boston RDCS Referring Phys: [place]  1. Left ventricular ejection fraction, by estimation, is 55 to 60%. The left ventricle has normal function. The left ventricle has no regional wall motion abnormalities. Left ventricular diastolic parameters are indeterminate.  2. Right ventricular systolic function is normal. The right ventricular size is normal. There is severely elevated pulmonary artery systolic pressure.  3. Left atrial size was moderately dilated.  4. Mechanical St Jude valve 25 mm no significant PVL MVA PT1/2 2.1 cm2 mean gradient 6 mmHg at HR of 64 bpm gradients slightly lower and MVA slightly larger than seen on TTE 07/17/19 . The mitral valve has been repaired/replaced. No evidence of mitral valve regurgitation. No evidence of mitral stenosis.  5. Tricuspid valve regurgitation is moderate.  6. Mechanical AVR 19 mm St Jude no PVL mean gradient 15 peak 31 mmHg AVA 1.3 cm2 Gradients lower than 07/17/19 patient prosthetic mis match. The aortic valve has been repaired/replaced. Aortic valve regurgitation is not visualized. No aortic stenosis is present.  7. The inferior vena cava is dilated in size with >50% respiratory  variability, suggesting right atrial pressure of 8 mmHg. FINDINGS  Left Ventricle: Left ventricular ejection fraction, by estimation, is 55 to 60%. The left ventricle has normal function. The left ventricle has no regional wall motion abnormalities. The left ventricular internal cavity size was normal in size. There is  no left ventricular hypertrophy. Left ventricular diastolic parameters are indeterminate. Right Ventricle: The right ventricular size is normal. No increase in right ventricular wall thickness. Right ventricular systolic function is normal. There is severely elevated pulmonary artery systolic pressure. The tricuspid regurgitant velocity is 5.06 m/s, and with an assumed right atrial pressure of 15 mmHg, the estimated right ventricular systolic pressure is 956.2 mmHg. Left Atrium: Left atrial size was moderately dilated. Right Atrium: Right atrial size was normal in size. Pericardium: There is no evidence of pericardial effusion. Mitral Valve: Mechanical St Jude valve 25 mm no significant PVL MVA PT1/2 2.1 cm2 mean gradient 6 mmHg at HR of 64 bpm gradients slightly lower and MVA slightly larger than seen on TTE 07/17/19. The mitral valve has been repaired/replaced. No evidence of mitral valve regurgitation. No evidence of mitral valve stenosis. MV peak gradient, 24.0 mmHg. The mean mitral valve gradient is 6.0 mmHg. Tricuspid Valve: The tricuspid valve is normal in structure. Tricuspid valve regurgitation is moderate . No evidence of tricuspid stenosis. Aortic Valve: Mechanical AVR 19 mm St Jude no PVL mean gradient 15 peak 31 mmHg AVA 1.3 cm2 Gradients lower than 07/17/19 patient prosthetic mis match. The aortic valve has been repaired/replaced. Aortic valve regurgitation is not visualized. No aortic stenosis is present. Aortic valve mean gradient measures 16.3 mmHg. Aortic valve peak gradient measures 32.0 mmHg. Aortic valve area, by VTI measures 1.12 cm. Pulmonic Valve: The pulmonic valve was normal  in structure. Pulmonic valve regurgitation is mild. No evidence of pulmonic stenosis. Aorta: The aortic root is normal in size and structure. Venous: The inferior vena cava is dilated in size with greater than 50% respiratory variability, suggesting right atrial pressure of 8 mmHg. IAS/Shunts: No atrial level shunt detected by color flow Doppler.  LEFT VENTRICLE PLAX 2D LVIDd:         5.00 cm   Diastology LVIDs:         3.50 cm   LV e' medial:    7.18 cm/s LV PW:         1.20 cm   LV E/e' medial:  28.0 LV IVS:        0.90 cm   LV e' lateral:   5.87 cm/s LVOT diam:     1.85 cm   LV E/e' lateral: 34.2 LV SV:         63 LV SV Index:   28 LVOT Area:     2.69 cm  RIGHT VENTRICLE RV S prime:     8.59 cm/s TAPSE (M-mode): 1.4 cm LEFT ATRIUM             Index        RIGHT ATRIUM           Index LA diam:        4.90 cm 2.16 cm/m   RA Area:  19.30 cm LA Vol (A2C):   55.8 ml 24.57 ml/m  RA Volume:   57.40 ml  25.27 ml/m LA Vol (A4C):   99.8 ml 43.94 ml/m LA Biplane Vol: 78.8 ml 34.70 ml/m  AORTIC VALVE AV Area (Vmax):    1.04 cm AV Area (Vmean):   1.03 cm AV Area (VTI):     1.12 cm AV Vmax:           283.00 cm/s AV Vmean:          184.333 cm/s AV VTI:            0.566 m AV Peak Grad:      32.0 mmHg AV Mean Grad:      16.3 mmHg LVOT Vmax:         109.50 cm/s LVOT Vmean:        70.350 cm/s LVOT VTI:          0.236 m LVOT/AV VTI ratio: 0.42  AORTA Ao Root diam: 3.10 cm Ao Asc diam:  3.35 cm MITRAL VALVE                TRICUSPID VALVE MV Area (PHT): cm          TR Peak grad:   102.4 mmHg MV Area VTI:   1.37 cm     TR Vmax:        506.00 cm/s MV Peak grad:  24.0 mmHg MV Mean grad:  6.0 mmHg     SHUNTS MV Vmax:       2.45 m/s     Systemic VTI:  0.24 m MV Vmean:      97.6 cm/s    Systemic Diam: 1.85 cm MV Decel Time: 332 msec MV E velocity: 201.00 cm/s MV A velocity: 61.90 cm/s MV E/A ratio:  3.25 Jenkins Rouge MD Electronically signed by Jenkins Rouge MD Signature Date/Time: 05/04/2021/10:11:20 AM    Final      Time  coordinating discharge: Over 30 minutes    Dwyane Dee, MD  [facility] 05/19/2021, 4:31 PM

## 2021-05-19 NOTE — Progress Notes (Signed)
Patient being discharged today. IVs and telemetry removed. Discharge instructions explained and given to patient.

## 2021-05-19 NOTE — Plan of Care (Signed)
°  Problem: Activity: Goal: Capacity to carry out activities will improve Outcome: Adequate for Discharge   Problem: Cardiac: Goal: Ability to achieve and maintain adequate cardiopulmonary perfusion will improve Outcome: Adequate for Discharge   Problem: Education: Goal: Knowledge of General Education information will improve Description: Including pain rating scale, medication(s)/side effects and non-pharmacologic comfort measures Outcome: Adequate for Discharge   Problem: Health Behavior/Discharge Planning: Goal: Ability to manage health-related needs will improve Outcome: Adequate for Discharge   Problem: Clinical Measurements: Goal: Ability to maintain clinical measurements within normal limits will improve Outcome: Adequate for Discharge   Problem: Pain Managment: Goal: General experience of comfort will improve Outcome: Adequate for Discharge   Problem: Elimination: Goal: Will not experience complications related to bowel motility Outcome: Adequate for Discharge   Problem: Coping: Goal: Level of anxiety will decrease Outcome: Adequate for Discharge   Problem: Nutrition: Goal: Adequate nutrition will be maintained Outcome: Adequate for Discharge   Problem: Activity: Goal: Risk for activity intolerance will decrease Outcome: Adequate for Discharge   Problem: Clinical Measurements: Goal: Cardiovascular complication will be avoided Outcome: Adequate for Discharge   Problem: Clinical Measurements: Goal: Respiratory complications will improve Outcome: Adequate for Discharge

## 2021-05-19 NOTE — Telephone Encounter (Signed)
Looks like patient is already scheduled for hospital follow up. Nothing further needed at this time.  Next Appt With Pulmonology Freddi Starr, MD) 06/09/2021 at 2:00 PM

## 2021-05-20 ENCOUNTER — Ambulatory Visit: Payer: Medicare Other | Admitting: Interventional Cardiology

## 2021-05-26 ENCOUNTER — Ambulatory Visit (INDEPENDENT_AMBULATORY_CARE_PROVIDER_SITE_OTHER): Payer: Medicare Other

## 2021-05-26 ENCOUNTER — Other Ambulatory Visit: Payer: Self-pay

## 2021-05-26 DIAGNOSIS — Z5181 Encounter for therapeutic drug level monitoring: Secondary | ICD-10-CM | POA: Diagnosis not present

## 2021-05-26 DIAGNOSIS — I480 Paroxysmal atrial fibrillation: Secondary | ICD-10-CM | POA: Diagnosis not present

## 2021-05-26 LAB — POCT INR: INR: 4 — AB (ref 2.0–3.0)

## 2021-05-26 NOTE — Patient Instructions (Signed)
Description   Hold today's dose and then continue taking 1/2  tablet every day except 1 tablet on Mondays and Fridays.   Recheck INR in 2 weeks. [facility] # [phone].

## 2021-05-27 ENCOUNTER — Telehealth: Payer: Self-pay

## 2021-05-27 NOTE — Telephone Encounter (Signed)
-----   Message from Lanier Clam, MD sent at 05/26/2021  5:02 PM EST ----- Regarding: RE: 3-4 week f/u with CXR Ok with me  ----- Message ----- From: Lorretta Harp, CMA Sent: 05/22/2021  10:05 AM EST To: Colon Branch, CMA, Lanier Clam, MD, # Subject: RE: 3-4 week f/u with CXR                      First avail 26min slot with Dr. Silas Flood is not until 1/30. Please advise if this will be okay for pt to wait until then. ----- Message ----- From: Freddi Starr, MD Sent: 05/19/2021  10:41 PM EST To: Colon Branch, CMA, Lanier Clam, MD, # Subject: FW: 3-4 week f/u with CXR                      This patient should have hospital follow up with Dr. Silas Flood due to concern for pulmonary hypertension.   Thanks, JD  ----- Message ----- From: Candee Furbish, MD Sent: 05/15/2021   9:25 AM EST To: Freddi Starr, MD, [facility] Triage Pool Subject: 3-4 week f/u with [facility] f/u with JD  Thanks, Linna Hoff

## 2021-05-27 NOTE — Telephone Encounter (Signed)
Called and spoke with patient to switch her appt from Dr.Dewald to Dr. Silas Flood. Appt has been switched. Nothing further needed at this time.   Next Appt With Pulmonology Lanier Clam, MD) 06/09/2021 at 4:30 PM

## 2021-05-28 ENCOUNTER — Ambulatory Visit (HOSPITAL_COMMUNITY): Admit: 2021-05-28 | Payer: Medicare Other | Admitting: Internal Medicine

## 2021-05-28 ENCOUNTER — Encounter (HOSPITAL_COMMUNITY): Payer: Self-pay

## 2021-05-28 SURGERY — ECHOCARDIOGRAM, TRANSESOPHAGEAL
Anesthesia: Monitor Anesthesia Care

## 2021-06-09 ENCOUNTER — Ambulatory Visit (INDEPENDENT_AMBULATORY_CARE_PROVIDER_SITE_OTHER): Payer: Medicare Other

## 2021-06-09 ENCOUNTER — Inpatient Hospital Stay: Payer: Medicare Other | Admitting: Pulmonary Disease

## 2021-06-09 ENCOUNTER — Other Ambulatory Visit: Payer: Self-pay

## 2021-06-09 ENCOUNTER — Ambulatory Visit (INDEPENDENT_AMBULATORY_CARE_PROVIDER_SITE_OTHER): Payer: Medicare Other | Admitting: Pulmonary Disease

## 2021-06-09 ENCOUNTER — Encounter: Payer: Self-pay | Admitting: Pulmonary Disease

## 2021-06-09 VITALS — BP 130/74 | HR 61 | Temp 98.5°F | Ht 66.0 in | Wt 248.6 lb

## 2021-06-09 DIAGNOSIS — Z5181 Encounter for therapeutic drug level monitoring: Secondary | ICD-10-CM

## 2021-06-09 DIAGNOSIS — I440 Atrioventricular block, first degree: Secondary | ICD-10-CM

## 2021-06-09 DIAGNOSIS — I480 Paroxysmal atrial fibrillation: Secondary | ICD-10-CM | POA: Diagnosis not present

## 2021-06-09 DIAGNOSIS — J9611 Chronic respiratory failure with hypoxia: Secondary | ICD-10-CM | POA: Diagnosis not present

## 2021-06-09 LAB — POCT INR: INR: 2.3 (ref 2.0–3.0)

## 2021-06-09 NOTE — Patient Instructions (Addendum)
Nice to see you again  I think it is okay to continue taking the Lasix 60 mg daily  Keep your weight at 148 or less  I will send a message to [name] let them know about the increase in Lasix as well as the slight weight gain after being out of the hospital  Your prior breathing test in 2019 were quite good.  It indicated that there is extra air trapped in the lungs probably from damage from cigarette smoking.  I recommend a trial of Stiolto 2 puffs once a day.  I provided samples of this.  If this helps your breathing, let me know and I will send a new prescription to your pharmacy.  I will order labs to be checked when you get your Coumadin or INR checked in 2 weeks in [place].  Return to clinic in 3 months or sooner as needed with Dr. Silas Flood

## 2021-06-09 NOTE — Patient Instructions (Signed)
Description   Take 1 tablet today and then continue taking 1/2 tablet every day except 1 tablet on Mondays and Fridays.   Recheck INR in 2 weeks. [facility] # [phone].

## 2021-06-10 NOTE — Progress Notes (Signed)
Chest xray appears improved.

## 2021-06-11 ENCOUNTER — Other Ambulatory Visit: Payer: Self-pay | Admitting: *Deleted

## 2021-06-11 DIAGNOSIS — I5032 Chronic diastolic (congestive) heart failure: Secondary | ICD-10-CM

## 2021-06-18 ENCOUNTER — Ambulatory Visit: Payer: Medicare Other | Admitting: Physician Assistant
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
## 2021-06-23 ENCOUNTER — Ambulatory Visit (INDEPENDENT_AMBULATORY_CARE_PROVIDER_SITE_OTHER): Payer: Medicare Other

## 2021-06-23 ENCOUNTER — Other Ambulatory Visit: Payer: Self-pay

## 2021-06-23 DIAGNOSIS — Z5181 Encounter for therapeutic drug level monitoring: Secondary | ICD-10-CM | POA: Diagnosis not present

## 2021-06-23 DIAGNOSIS — I480 Paroxysmal atrial fibrillation: Secondary | ICD-10-CM

## 2021-06-23 LAB — POCT INR: INR: 2.5 (ref 2.0–3.0)

## 2021-06-23 NOTE — Patient Instructions (Signed)
Description   Take 1 tablet today and then continue taking 1/2 tablet every day except 1 tablet on Mondays and Fridays.   Recheck INR in 3 weeks. [facility] # [phone].

## 2021-06-24 LAB — BASIC METABOLIC PANEL
BUN/Creatinine Ratio: 18 (ref 12–28)
BUN: 21 mg/dL (ref 8–27)
CO2: 23 mmol/L (ref 20–29)
Calcium: 9.6 mg/dL (ref 8.7–10.3)
Chloride: 103 mmol/L (ref 96–106)
Creatinine, Ser: 1.19 mg/dL — ABNORMAL HIGH (ref 0.57–1.00)
Glucose: 91 mg/dL (ref 70–99)
Potassium: 3.3 mmol/L — ABNORMAL LOW (ref 3.5–5.2)
Sodium: 144 mmol/L (ref 134–144)
eGFR: 52 mL/min/{1.73_m2} — ABNORMAL LOW (ref >59)

## 2021-06-25 ENCOUNTER — Other Ambulatory Visit: Payer: Self-pay | Admitting: *Deleted

## 2021-06-25 DIAGNOSIS — E876 Hypokalemia: Secondary | ICD-10-CM

## 2021-06-26 ENCOUNTER — Telehealth: Payer: Self-pay

## 2021-06-26 MED ORDER — POTASSIUM CHLORIDE CRYS ER 20 MEQ PO TBCR: 20.0000 meq | EXTENDED_RELEASE_TABLET | Freq: Every day | ORAL | 3 refills | Status: DC

## 2021-06-26 NOTE — Telephone Encounter (Signed)
-----   Message from Belva Crome, MD sent at 06/26/2021  1:00 PM EST ----- Let the patient know the labs look okay. Increase K-Dur to one daily. A copy will be sent to [name], [place]

## 2021-06-26 NOTE — Telephone Encounter (Signed)
The patient has been notified of the result and verbalized understanding.  All questions (if any) were answered. Antonieta Iba, RN 06/26/2021 4:01 PM

## 2021-07-02 ENCOUNTER — Telehealth: Payer: Self-pay | Admitting: Pulmonary Disease

## 2021-07-02 MED ORDER — STIOLTO RESPIMAT 2.5-2.5 MCG/ACT IN AERS: 2.0000 | INHALATION_SPRAY | Freq: Every day | RESPIRATORY_TRACT | 11 refills | Status: DC

## 2021-07-02 NOTE — Telephone Encounter (Signed)
Lm for patient.  What is her preferred pharmacy? 

## 2021-07-02 NOTE — Telephone Encounter (Signed)
Called and spoke with patient to verify her pharmacy to send RX for Stiolto to. She verified it for me. RX has been sent. Nothing further needed at this time.

## 2021-07-06 ENCOUNTER — Telehealth: Payer: Self-pay | Admitting: Pulmonary Disease

## 2021-07-06 MED ORDER — STIOLTO RESPIMAT 2.5-2.5 MCG/ACT IN AERS: 2.0000 | INHALATION_SPRAY | Freq: Every day | RESPIRATORY_TRACT | 0 refills | Status: DC

## 2021-07-06 NOTE — Telephone Encounter (Signed)
Called and spoke with pharmacy to ask about RX and was told that they did receive it but that insurance was not paying for the prescription. They said that the alternatives are Anoro and Breo or doing a PA.   Called and spoke with patient to let her know the above information. She states that she is completely out of [place]. Advised her that I can leave a sample up front for her if someone could come and pick it up. Asked patient if she was interested in patient assistance paperwork to see if we could get the inhaler covered and she said yes. Advised her that I would put the paperwork in the bag with the samples and for her to fill out the highlighted parts and to bring it back to office or mail it. Patient expressed understanding. Both have been placed up front for pick up.  Will also route to Dr. Silas Flood as Juluis Rainier for the inhalers. Nothing further needed at this time.

## 2021-07-14 ENCOUNTER — Other Ambulatory Visit: Payer: Self-pay | Admitting: Gastroenterology

## 2021-07-14 ENCOUNTER — Other Ambulatory Visit: Payer: Self-pay

## 2021-07-14 ENCOUNTER — Ambulatory Visit (INDEPENDENT_AMBULATORY_CARE_PROVIDER_SITE_OTHER): Payer: Medicare Other

## 2021-07-14 DIAGNOSIS — I480 Paroxysmal atrial fibrillation: Secondary | ICD-10-CM

## 2021-07-14 DIAGNOSIS — Z5181 Encounter for therapeutic drug level monitoring: Secondary | ICD-10-CM | POA: Diagnosis not present

## 2021-07-14 DIAGNOSIS — R197 Diarrhea, unspecified: Secondary | ICD-10-CM

## 2021-07-14 LAB — POCT INR: INR: 2.2 (ref 2.0–3.0)

## 2021-07-14 NOTE — Patient Instructions (Signed)
Description   Take 1 tablet today and then START taking 1/2 tablet every day except 1 tablet on Mondays, Wednesdays, Fridays.   Recheck INR in 3 weeks. Coumadin Clinic # [phone].

## 2021-07-20 NOTE — Progress Notes (Signed)
[facility]  [address] [place],  [place]  [address] [phone]  Clinic Day:  07/21/2021  Referring physician: [name], FNP  This document serves as a record of services personally performed by Marice Potter, MD. It was created on their behalf by [facility], a trained medical scribe. The creation of this record is based on the scribe's personal observations and the provider's statements to them.  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  This is based upon her having an elevated LDH and undetectable haptoglobin level.  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  Since her last visit, she was in the process of being evaluated for having defective heart valves.  However, she went to complete lower congestive heart failure, which led to prolonged hospitalization.  Aggressive diuresis was done.  Furthermore, a right heart catheterization was done, which showed severe pulmonary hypertension.  She comes in today doing relatively well.  However, she continues to complain baseline fatigue.  She also notices that her color remains pale.  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  PHYSICAL EXAM:  Blood pressure (!) 131/59, pulse 73, temperature 98.3 F (36.8 C), resp. rate 18, height 5\' 6"  (1.676 m), weight 242 lb 9.6 oz (110 kg), SpO2 95 %. Wt Readings from Last 3 Encounters:  07/21/21 242 lb 9.6 oz (110 kg)  06/09/21 248 lb 9.6 oz (112.8 kg)  05/19/21 243 lb 9.6 oz (110.5 kg)   Body mass index is 39.16 kg/m. Performance status (ECOG): 2 - Symptomatic, <50% confined to bed Physical Exam Constitutional:      Appearance: Normal appearance. She is not ill-appearing.  HENT:     Mouth/Throat:     Mouth: Mucous membranes are moist.     Pharynx: Oropharynx is clear. No oropharyngeal exudate or  posterior oropharyngeal erythema.  Cardiovascular:     Rate and Rhythm: Normal rate and regular rhythm.     Heart sounds: No murmur heard.   No friction rub. No gallop.  Pulmonary:     Effort: Pulmonary effort is normal. No respiratory distress.     Breath sounds: Normal breath sounds. No wheezing, rhonchi or rales.  Abdominal:     General: Bowel sounds are normal. There is no distension.     Palpations: Abdomen is soft. There is no mass.     Tenderness: There is no abdominal tenderness.  Musculoskeletal:        General: No swelling.     Right lower leg: Swelling present. No edema.     Left lower leg: No edema.  Lymphadenopathy:     Cervical: No cervical adenopathy.     Upper Body:     Right upper body: No supraclavicular or axillary adenopathy.     Left upper body: No supraclavicular or axillary adenopathy.     Lower Body: No right inguinal adenopathy. No left inguinal adenopathy.  Skin:    General: Skin is warm.     Coloration: Skin is not jaundiced.     Findings: No lesion or rash.  Neurological:     General: No focal deficit present.     Mental Status: She is alert and oriented to person, place, and time. Mental status is at baseline.  Psychiatric:        Mood and Affect: Mood normal.  Behavior: Behavior normal.        Thought Content: Thought content normal.    LABS:   CBC Latest Ref Rng & Units 07/21/2021 05/19/2021 05/16/2021  WBC - 6.4 4.9 6.7  Hemoglobin 12.0 - 16.0 9.9(A) 9.2(L) 9.1(L)  Hematocrit 36 - 46 31(A) 30.4(L) 31.0(L)  Platelets 150 - 399 209 174 157   CMP Latest Ref Rng & Units 06/23/2021 05/19/2021 05/18/2021  Glucose 70 - 99 mg/dL 91 - 90  BUN 8 - 27 mg/dL 21 - 18  Creatinine 0.57 - 1.00 mg/dL 1.19(H) 1.09(H) 1.27(H)  Sodium 134 - 144 mmol/L 144 - 137  Potassium 3.5 - 5.2 mmol/L 3.3(L) - 3.5  Chloride 96 - 106 mmol/L 103 - 91(L)  CO2 20 - 29 mmol/L 23 - 37(H)  Calcium 8.7 - 10.3 mg/dL 9.6 - 9.4  Total Protein 6.5 - 8.1 g/dL - - -  Total  Bilirubin 0.3 - 1.2 mg/dL - - -  Alkaline Phos 38 - 126 U/L - - -  AST 15 - 41 U/L - - -  ALT 0 - 44 U/L - - -    Latest Reference Range & Units 07/21/21 15:04  LDH 98 - 192 U/L 256 (H)  (H): Data is abnormally high    Latest Reference Range & Units 07/21/21 15:49  Iron 28 - 170 ug/dL 46  UIBC ug/dL 312  TIBC 250 - 450 ug/dL 358  Saturation Ratios 10.4 - 31.8 % 13  Ferritin 11 - 307 ng/mL 26  Folate >5.9 ng/mL 7.2  Vitamin B12 180 - 914 pg/mL 227   ASSESSMENT & PLAN:  Assessment/Plan:  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  Although her hemoglobin remains low today, it is slightly better than what it was previously.  Her undetectable haptoglobin and elevated LDH still point to hemolysis still taking place.  I will have her see her cardiologist once again to resume the work-up of her potentially defective mechanical heart valves being behind her hemolytic process.  Otherwise, I will see her back in 3 months for repeat clinical assessment.  The patient understands all the plans discussed today and is in agreement with them.  I, Rita Ohara, am acting as scribe for Marice Potter, MD    I have reviewed this report as typed by the medical scribe, and it is complete and accurate.  Zareya Tuckett Macarthur Critchley, MD

## 2021-07-21 ENCOUNTER — Other Ambulatory Visit: Payer: Self-pay

## 2021-07-21 ENCOUNTER — Inpatient Hospital Stay: Payer: Medicare Other

## 2021-07-21 ENCOUNTER — Inpatient Hospital Stay: Payer: Medicare Other | Attending: Oncology | Admitting: Oncology

## 2021-07-21 ENCOUNTER — Other Ambulatory Visit: Payer: Self-pay | Admitting: Hematology and Oncology

## 2021-07-21 ENCOUNTER — Other Ambulatory Visit: Payer: Self-pay | Admitting: Oncology

## 2021-07-21 VITALS — BP 131/59 | HR 73 | Temp 98.3°F | Resp 18 | Ht 66.0 in | Wt 242.6 lb

## 2021-07-21 DIAGNOSIS — D589 Hereditary hemolytic anemia, unspecified: Secondary | ICD-10-CM | POA: Diagnosis not present

## 2021-07-21 DIAGNOSIS — D599 Acquired hemolytic anemia, unspecified: Secondary | ICD-10-CM

## 2021-07-21 DIAGNOSIS — D649 Anemia, unspecified: Secondary | ICD-10-CM

## 2021-07-21 DIAGNOSIS — D539 Nutritional anemia, unspecified: Secondary | ICD-10-CM

## 2021-07-21 LAB — CBC AND DIFFERENTIAL
HCT: 31 — AB (ref 36–46)
Hemoglobin: 9.9 — AB (ref 12.0–16.0)
Neutrophils Absolute: 4.67
Platelets: 209 (ref 150–399)
WBC: 6.4

## 2021-07-21 LAB — CBC
Absolute Lymphocytes: 0.51 — AB (ref 0.65–4.75)
MCV: 93 (ref 81–99)
RBC: 3.39 — AB (ref 3.87–5.11)

## 2021-07-21 LAB — VITAMIN B12: Vitamin B-12: 227 pg/mL (ref 180–914)

## 2021-07-21 LAB — IRON AND TIBC
Iron: 46 ug/dL (ref 28–170)
Saturation Ratios: 13 % (ref 10.4–31.8)
TIBC: 358 ug/dL (ref 250–450)
UIBC: 312 ug/dL

## 2021-07-21 LAB — LACTATE DEHYDROGENASE: LDH: 256 U/L — ABNORMAL HIGH (ref 98–192)

## 2021-07-21 LAB — FERRITIN: Ferritin: 26 ng/mL (ref 11–307)

## 2021-07-21 LAB — FOLATE: Folate: 7.2 ng/mL (ref >5.9)

## 2021-07-22 LAB — HAPTOGLOBIN: Haptoglobin: <10 mg/dL — ABNORMAL LOW (ref 37–355)

## 2021-07-24 NOTE — Telephone Encounter (Signed)
Forms placed in Dr. Daryl Eastern box.

## 2021-07-30 ENCOUNTER — Telehealth: Payer: Self-pay | Admitting: Pulmonary Disease

## 2021-07-30 NOTE — Telephone Encounter (Signed)
Called patient and let her know her samples are up at the front. Waiting for PA to be approved. 2 boxes given. ? ?Nothing further needed  ?

## 2021-08-04 ENCOUNTER — Other Ambulatory Visit: Payer: Self-pay

## 2021-08-04 ENCOUNTER — Ambulatory Visit (INDEPENDENT_AMBULATORY_CARE_PROVIDER_SITE_OTHER): Payer: Medicare Other

## 2021-08-04 ENCOUNTER — Telehealth: Payer: Self-pay | Admitting: Pulmonary Disease

## 2021-08-04 DIAGNOSIS — Z5181 Encounter for therapeutic drug level monitoring: Secondary | ICD-10-CM

## 2021-08-04 DIAGNOSIS — I480 Paroxysmal atrial fibrillation: Secondary | ICD-10-CM | POA: Diagnosis not present

## 2021-08-04 LAB — POCT INR: INR: 3.4 — AB (ref 2.0–3.0)

## 2021-08-04 NOTE — Telephone Encounter (Signed)
Called and informed [name] pts sister that I had the PA paperwork in my notebook and when Dr Silas Flood returns to office on Thursday I will have him sign it and will fax it over. Nothing further needed  ?

## 2021-08-04 NOTE — Patient Instructions (Signed)
Description   ?Continue taking 1/2 tablet every day except 1 tablet on Mondays, Wednesdays, Fridays.  ?Recheck INR in 2 weeks. Coumadin Clinic # [phone].  ?  ?   ?

## 2021-08-06 ENCOUNTER — Other Ambulatory Visit: Payer: Self-pay

## 2021-08-06 MED ORDER — STIOLTO RESPIMAT 2.5-2.5 MCG/ACT IN AERS: 2.0000 | INHALATION_SPRAY | Freq: Every day | RESPIRATORY_TRACT | 11 refills | Status: DC

## 2021-08-21 ENCOUNTER — Encounter (HOSPITAL_COMMUNITY): Payer: Self-pay | Admitting: *Deleted

## 2021-08-25 ENCOUNTER — Ambulatory Visit
Admission: RE | Admit: 2021-08-25 | Discharge: 2021-08-25 | Disposition: A | Discharge status: Home / Self Care | Payer: Medicare Other | Source: Ambulatory Visit | Attending: Gastroenterology | Admitting: Gastroenterology

## 2021-08-25 DIAGNOSIS — R197 Diarrhea, unspecified: Secondary | ICD-10-CM
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
## 2021-09-01 ENCOUNTER — Ambulatory Visit (INDEPENDENT_AMBULATORY_CARE_PROVIDER_SITE_OTHER): Payer: Medicare Other

## 2021-09-01 ENCOUNTER — Ambulatory Visit (INDEPENDENT_AMBULATORY_CARE_PROVIDER_SITE_OTHER): Payer: Medicare Other | Admitting: Interventional Cardiology

## 2021-09-01 ENCOUNTER — Ambulatory Visit: Payer: Medicare Other | Admitting: Pulmonary Disease

## 2021-09-01 ENCOUNTER — Encounter: Payer: Self-pay | Admitting: Interventional Cardiology

## 2021-09-01 VITALS — BP 106/72 | HR 68 | Ht 66.0 in | Wt 232.6 lb

## 2021-09-01 DIAGNOSIS — I5032 Chronic diastolic (congestive) heart failure: Secondary | ICD-10-CM

## 2021-09-01 DIAGNOSIS — Z5181 Encounter for therapeutic drug level monitoring: Secondary | ICD-10-CM

## 2021-09-01 DIAGNOSIS — N183 Chronic kidney disease, stage 3 unspecified: Secondary | ICD-10-CM

## 2021-09-01 DIAGNOSIS — Z952 Presence of prosthetic heart valve: Secondary | ICD-10-CM | POA: Diagnosis not present

## 2021-09-01 DIAGNOSIS — D599 Acquired hemolytic anemia, unspecified: Secondary | ICD-10-CM

## 2021-09-01 DIAGNOSIS — J849 Interstitial pulmonary disease, unspecified: Secondary | ICD-10-CM

## 2021-09-01 DIAGNOSIS — I480 Paroxysmal atrial fibrillation: Secondary | ICD-10-CM

## 2021-09-01 DIAGNOSIS — Z7901 Long term (current) use of anticoagulants: Secondary | ICD-10-CM

## 2021-09-01 LAB — POCT INR: INR: 3.1 — AB (ref 2.0–3.0)

## 2021-09-01 NOTE — Patient Instructions (Signed)
Description   ?Continue on same dosage of Warfarin 1/2 tablet every day except 1 tablet on Mondays, Wednesdays, Fridays.  ?Recheck INR in 4 weeks. Coumadin Clinic # [phone].  ?  ?  ?

## 2021-09-01 NOTE — Patient Instructions (Signed)
Medication Instructions:  ?Your physician recommends that you continue on your current medications as directed. Please refer to the Current Medication list given to you today. ? ?*If you need a refill on your cardiac medications before your next appointment, please call your pharmacy* ? ? ?Lab Work: ?BMET, Liver, CBC and Pro BNP today ? ?If you have labs (blood work) drawn today and your tests are completely normal, you will receive your results only by: ?MyChart Message (if you have MyChart) OR ?A paper copy in the mail ?If you have any lab test that is abnormal or we need to change your treatment, we will call you to review the results. ? ? ?Testing/Procedures: ?None ? ? ?Follow-Up: ?At [facility], you and your health needs are our priority.  As part of our continuing mission to provide you with exceptional heart care, we have created designated Provider Care Teams.  These Care Teams include your primary Cardiologist (physician) and Advanced Practice Providers (APPs -  Physician Assistants and Nurse Practitioners) who all work together to provide you with the care you need, when you need it. ? ?We recommend signing up for the patient portal called "MyChart".  Sign up information is provided on this After Visit Summary.  MyChart is used to connect with patients for Virtual Visits (Telemedicine).  Patients are able to view lab/test results, encounter notes, upcoming appointments, etc.  Non-urgent messages can be sent to your provider as well.   ?To learn more about what you can do with MyChart, go to [email or web address].   ? ?Your next appointment:   ?3 month(s) ? ?The format for your next appointment:   ?In Person ? ?Provider:   ?Sinclair Grooms, MD  ? ? ?Other Instructions ?  ?

## 2021-09-02 LAB — HEPATIC FUNCTION PANEL
ALT: 12 IU/L (ref 0–32)
AST: 20 IU/L (ref 0–40)
Albumin: 4.7 g/dL (ref 3.8–4.8)
Alkaline Phosphatase: 79 IU/L (ref 44–121)
Bilirubin Total: 0.8 mg/dL (ref 0.0–1.2)
Bilirubin, Direct: 0.27 mg/dL (ref 0.00–0.40)
Total Protein: 7.7 g/dL (ref 6.0–8.5)

## 2021-09-02 LAB — BASIC METABOLIC PANEL
BUN/Creatinine Ratio: 21 (ref 12–28)
BUN: 26 mg/dL (ref 8–27)
CO2: 26 mmol/L (ref 20–29)
Calcium: 10 mg/dL (ref 8.7–10.3)
Chloride: 99 mmol/L (ref 96–106)
Creatinine, Ser: 1.25 mg/dL — ABNORMAL HIGH (ref 0.57–1.00)
Glucose: 86 mg/dL (ref 70–99)
Potassium: 4.7 mmol/L (ref 3.5–5.2)
Sodium: 140 mmol/L (ref 134–144)
eGFR: 49 mL/min/{1.73_m2} — ABNORMAL LOW (ref >59)

## 2021-09-02 LAB — CBC
Hematocrit: 33.3 % — ABNORMAL LOW (ref 34.0–46.6)
Hemoglobin: 10 g/dL — ABNORMAL LOW (ref 11.1–15.9)
MCH: 28 pg (ref 26.6–33.0)
MCHC: 30 g/dL — ABNORMAL LOW (ref 31.5–35.7)
MCV: 93 fL (ref 79–97)
Platelets: 311 10*3/uL (ref 150–450)
RBC: 3.57 x10E6/uL — ABNORMAL LOW (ref 3.77–5.28)
RDW: 15.8 % — ABNORMAL HIGH (ref 11.7–15.4)
WBC: 8 10*3/uL (ref 3.4–10.8)

## 2021-09-02 LAB — PRO B NATRIURETIC PEPTIDE: NT-Pro BNP: 2651 pg/mL — ABNORMAL HIGH (ref 0–287)

## 2021-09-07 ENCOUNTER — Telehealth: Payer: Self-pay | Admitting: *Deleted

## 2021-09-07 NOTE — Telephone Encounter (Signed)
? ?  Pre-operative Risk Assessment  ?  ?Patient Name: [name]  ?DOB: 1960-10-31 ?MRN: [id number]  ? ? ? ?Request for Surgical Clearance   ? ?Procedure:   REMOVE MAXILLARY TIMESH BONE PLATES, MAXILLARY VESTIBULOPLASTY ? ?Date of Surgery:  Clearance TBD                              ?   ?Surgeon:  DR. Diona Browner, DMD ?Surgeon's Group or Practice Name:  Diona Browner, DMD ?Phone number:  [phone] ?Fax number:  [phone] ?  ?Type of Clearance Requested:   ?- Medical  ?- Pharmacy:  Hold Warfarin (Coumadin)   ?  ?Type of Anesthesia:  General  ?  ?Additional requests/questions:   ? ?Signed, ?Julaine Hua   ?09/07/2021, 10:56 AM  ? ?

## 2021-09-07 NOTE — Telephone Encounter (Signed)
Primary Cardiologist:Henry Carlye Grippe, MD ? ?Chart reviewed as part of pre-operative protocol coverage for [name].  ? ?I discussed this case with [name]. He would like her to hold off on undergoing general anesthesia until she sees pulmonary and a decision can be made regarding further work-up of the hemolysis that may be secondary to mechanical valves.  ? ?Pre-op covering staff: ?- Please contact patient and advise her of Dr. Thompson Caul recommendations ?- Please also contact requesting surgeon's office via preferred method (i.e, phone, fax) to inform them.  ? ?Emmaline Life, [name] ? ?  ?09/07/2021, 12:47 PM ?[place] ?[address], [address] ?Office [phone] Fax [phone] ? ?

## 2021-09-07 NOTE — Telephone Encounter (Signed)
Preop Callback: ? ?Please contact patient to evaluate the urgency of this procedure. Did she discuss with [name] at her office visit on 09/01/21? If it is urgent, then we will ask [name] to clear her since he just saw her. ?

## 2021-09-07 NOTE — Telephone Encounter (Signed)
Spoke with pt, she has been made aware of Dr. Thompson Caul recommendations regarding getting cleared by Pulmonary before she undergoes any anesthesia.  Pt agrees and was thankful for the call back. ? ?Will route back to the requesting surgeon's office to make them aware. ?

## 2021-09-07 NOTE — Telephone Encounter (Signed)
Spoke with pt.  She didn't speak with [name] at her last office appointment because she didn't know she was going to have it done until after, when she saw Dr. Hoyt Koch. ? ?Per pt it's not urgent, but would like it done.  ?

## 2021-09-10 ENCOUNTER — Telehealth: Payer: Self-pay | Admitting: Pulmonary Disease

## 2021-09-10 NOTE — Telephone Encounter (Signed)
Fax received from Dr. Diona Browner to remove maxillary timesh bone plates, maxillary vestibuloplasty on patient.  Patient needs surgery clearance. Patient was seen on 06/09/2021. Office protocol is a risk assessment can be sent to surgeon if patient has been seen in 60 days or less.  ? ?Sending to Dr. Silas Flood for risk assessment or recommendations if patient needs to be seen in office prior to surgical procedure.   ?

## 2021-09-15 NOTE — Telephone Encounter (Signed)
She has visit later this week, will address at that time. Thanks!

## 2021-09-17 ENCOUNTER — Encounter: Payer: Self-pay | Admitting: Pulmonary Disease

## 2021-09-17 ENCOUNTER — Ambulatory Visit (INDEPENDENT_AMBULATORY_CARE_PROVIDER_SITE_OTHER): Payer: Medicare Other | Admitting: Pulmonary Disease

## 2021-09-17 VITALS — BP 126/74 | HR 65 | Temp 98.2°F | Ht 66.0 in | Wt 239.0 lb

## 2021-09-17 DIAGNOSIS — J9621 Acute and chronic respiratory failure with hypoxia: Secondary | ICD-10-CM | POA: Diagnosis not present

## 2021-09-17 MED ORDER — ANORO ELLIPTA 62.5-25 MCG/ACT IN AEPB
1.0000 | INHALATION_SPRAY | Freq: Every day | RESPIRATORY_TRACT | 11 refills | Status: DC
Start: 2021-09-17 — End: 2022-12-13

## 2021-09-17 NOTE — Progress Notes (Signed)
? ?'@Patient'$  ID: [name], female    DOB: August 02, 1960, 61 y.o.   MRN: [id number] ? ?Chief Complaint  ?Patient presents with  ? Follow-up  ?  Follow up. Pt states that she is not getting worse and that she is staying the same with her energy levels. Pt is on 3L of o2 at rest and 4L with activity.   ? ? ?Referring provider: ?[name], FNP ? ?HPI:  ? ?61 y.o. woman whom we are seeing in follow-up for chronic hypoxemic respiratory failure. ? ?Doing well overall. Still using O2 and it is needed. Stiolto prescribed last visit. She thinks helps with DOE, has more energy. Has been using samples. Called pharmacy and says not preferred despite being listed on [place] medicaid formulary checked today. Discussed TEE and surgical procedure for mouth. Both do not seem urgent given stable blood counts (hemolysis related to mechanical valves).  ? ?HPI at initial visit: ?Overall, patient feels well with the same.  Still pretty dyspneic.  Using 4 L as of the time of discharge.  Trying to move around a bit more.  Notes weight was up about 10 pounds after discharge.  She was discharged on 40 mg of Lasix p.o. every other day.  This was in response to resolving AKI with aggressive diuresis in the hospital.  Since then, she is taking 60 mg of Lasix every day.  She her weight is down about 5 pounds.  Still up about 5 pounds from discharge weight.  She has lower extremity swelling is essentially gone and much improved but the weight gain remains.  We discussed at length to continue this and to try to target a weight of no more than 248 pounds.  She is to take more Lasix if her weight goes over this.  Discussed care with her cardiologist, [name].  Coordinated repeat lab draws in the coming days to assess electrolyte and kidney function on increased dose of Lasix. ? ?Notably, she has a history of multifocal lung infiltrates.  These were ultimately found to be consistent with hemosiderin laden macrophages likely in the setting of  uncontrolled mitral valve disease causing hemoptysis.  These infiltrate subsequently improved.  No further work-up needed at this time. ? ?PMH: Aortic and mitral valve repair, pulmonary hypertension, ?Surgical history: Aortic and mitral valve replacement, gastric bypass, cesarean section ?Family history: Mother with emphysema, hypertension, dementia, father with heart disease status post valve replacement, kidney disease, hypertension, kidney failure on dialysis ?Social history: Former smoker, 35-pack-year, quit 7 years ago ? ?Questionaires / Pulmonary Flowsheets:  ? ?ACT:  ?   ? View : No data to display.  ?  ?  ?  ? ? ?MMRC: ?   ? View : No data to display.  ?  ?  ?  ? ? ?Epworth:  ?   ? View : No data to display.  ?  ?  ?  ? ? ?Tests:  ? ?FENO:  ?No results found for: NITRICOXIDE ? ?PFT: ? ?  Latest Ref Rng & Units 10/12/2017  ?  8:40 AM 10/23/2015  ?  8:56 AM 04/28/2015  ? 10:42 AM  ?PFT Results  ?FVC-Pre L 1.76   1.82   2.32    ?FVC-Predicted Pre % 52   53   67    ?FVC-Post L 1.70   1.98   2.27    ?FVC-Predicted Post % 50   58   66    ?Pre FEV1/FVC % % 85  81   70    ?Post FEV1/FCV % % 84   81   77    ?FEV1-Pre L 1.50   1.48   1.62    ?FEV1-Predicted Pre % 57   55   60    ?FEV1-Post L 1.43   1.60   1.74    ?DLCO uncorrected ml/min/mmHg 36.39   15.75   13.18    ?DLCO UNC% % 150   64   54    ?DLCO corrected ml/min/mmHg  14.28     ?DLCO COR %Predicted %  58     ?DLVA Predicted % 71   79   68    ?TLC L 4.70   4.56   4.58    ?TLC % Predicted % 93   90   90    ?RV % Predicted % 113   133   114    ?Personally reviewed and interpreted as spirometry suggestive of gas trapping versus moderate restriction, total lung capacity within normal limits, RV confirms gas trapping, DLCO moderately reduced ? ?WALK:  ? ?  06/20/2015  ? 12:34 PM 02/18/2015  ?  4:28 PM 02/27/2014  ?  5:10 PM  ?SIX MIN WALK  ?Supplimental Oxygen during Test? (L/min) Yes No No  ?O2 Flow Rate 3 L/min    ?Type Continuous    ?Tech Comments: slow pace walk. lap  1 on room air. lap 2 pt put on 2L due to sob, lap 3 increased to 3L due to sob. pt walked a moderate pace, did not wish to complete laps 2 and 3 d/t dyspnea.    ? ? ?Imaging: ?Personally reviewed and as per EMR ?CT VIRTUAL COLONOSCOPY DIAGNOSTIC ? ?Result Date: 08/25/2021 ?CLINICAL DATA:  Diarrhea EXAM: CT VIRTUAL COLONOSCOPY DIAGNOSTIC TECHNIQUE: The patient was given a standard bowel preparation with Gastrografin and barium for fluid and stool tagging respectively. The quality of the bowel preparation is poor. Automated CO2 insufflation of the colon was performed prior to image acquisition and colonic distention is poor. Image post processing was used to generate a 3D endoluminal fly-through projection of the colon and to electronically subtract stool/fluid as appropriate. COMPARISON:  None. FINDINGS: VIRTUAL COLONOSCOPY Suboptimal study due to large amount of retained barium throughout the colon and suboptimal insufflation/distension. No visible fixed polypoid filling defects or annular constricting lesions seen, but areas of the ascending colon and sigmoid colon not optimally evaluated due to under distension. Scattered diverticula. Virtual colonoscopy is not designed to detect diminutive polyps (i.e., less than or equal to 5 mm), the presence or absence of which may not affect clinical management. CT ABDOMEN AND PELVIS WITHOUT CONTRAST Lower chest: No acute abnormality. Hepatobiliary: Gallbladder filled with high-density material, stable since prior study. This could reflect milk of calcium, sludge or stones, unchanged. No focal hepatic abnormality or biliary ductal dilatation. Pancreas: No focal abnormality or ductal dilatation. Spleen: No focal abnormality.  Normal size. Adrenals/Urinary Tract: 1.7 cm exophytic lesion off the midpole of the left kidney, stable since prior study, difficult to characterize on this noncontrast study. No hydronephrosis. Adrenal glands unremarkable. Urinary bladder decompressed,  grossly unremarkable. Stomach/Bowel: Prior gastric sleeve. Stomach and small bowel decompressed, grossly unremarkable. Vascular/Lymphatic: Aortic atherosclerosis. Reproductive: No pelvic mass. Other: No free fluid or free air. Musculoskeletal: No acute bony abnormality. IMPRESSION: Suboptimal evaluation of the colon due to large amount of retained barium and under distension. No visible fixed polypoid filling defects or annular constricting lesions. Scattered colonic diverticulosis. Aortic atherosclerosis. High-density material  again fills the gallbladder, unchanged since prior study. This could reflect milk of calcium, stones or sludge. Prior gastric sleeve.  No complicating features. Stable exophytic lesion off the midpole of the left kidney, difficult to characterize on this noncontrast study. Stability suggested mine process such as cyst. Electronically Signed   By: Rolm Baptise M.D.   On: 08/25/2021 09:46   ? ?Lab Results: ?Personally reviewed ?CBC ?   ?Component Value Date/Time  ? WBC 8.0 09/01/2021 1408  ? WBC 4.9 05/19/2021 0552  ? RBC 3.57 (L) 09/01/2021 1408  ? RBC 3.39 (A) 07/21/2021 0000  ? HGB 10.0 (L) 09/01/2021 1408  ? HCT 33.3 (L) 09/01/2021 1408  ? PLT 311 09/01/2021 1408  ? MCV 93 09/01/2021 1408  ? MCV 93 07/21/2021 0000  ? MCH 28.0 09/01/2021 1408  ? MCH 29.0 05/19/2021 0552  ? MCHC 30.0 (L) 09/01/2021 1408  ? MCHC 30.3 05/19/2021 0552  ? RDW 15.8 (H) 09/01/2021 1408  ? LYMPHSABS 0.8 05/16/2021 0220  ? LYMPHSABS 0.9 02/03/2021 1631  ? MONOABS 0.9 05/16/2021 0220  ? EOSABS 0.5 05/16/2021 0220  ? EOSABS 0.2 02/03/2021 1631  ? BASOSABS 0.0 05/16/2021 0220  ? BASOSABS 0.0 02/03/2021 1631  ? ? ?BMET ?   ?Component Value Date/Time  ? NA 140 09/01/2021 1408  ? K 4.7 09/01/2021 1408  ? CL 99 09/01/2021 1408  ? CO2 26 09/01/2021 1408  ? GLUCOSE 86 09/01/2021 1408  ? GLUCOSE 90 05/18/2021 0122  ? BUN 26 09/01/2021 1408  ? CREATININE 1.25 (H) 09/01/2021 1408  ? CREATININE 1.31 (H) 02/03/2021 1631  ? CALCIUM  10.0 09/01/2021 1408  ? GFRNONAA 58 (L) 05/19/2021 0552  ? GFRAA 55 (L) 06/04/2020 1424  ? ? ?BNP ?   ?Component Value Date/Time  ? BNP 218.2 (H) 05/16/2021 0934  ? BNP 281.7 (H) 04/07/2015 1249  ? ? ?P

## 2021-09-17 NOTE — Patient Instructions (Signed)
Nice to see you again ? ?I sent a new prescription for Anoro - use 1 puff once daily. This replaces the Stiolto. If anoro does not work as well please let me know. ? ?I will message your heart doctor ? ?Return to clinic in 6 months or sooner as needed ?

## 2021-09-29 NOTE — Telephone Encounter (Signed)
OV notes and clearance form have been faxed back to [employer]. Nothing further needed at this time. ?

## 2021-10-06 ENCOUNTER — Ambulatory Visit (INDEPENDENT_AMBULATORY_CARE_PROVIDER_SITE_OTHER): Payer: Medicare Other

## 2021-10-06 DIAGNOSIS — Z5181 Encounter for therapeutic drug level monitoring: Secondary | ICD-10-CM

## 2021-10-06 DIAGNOSIS — I480 Paroxysmal atrial fibrillation: Secondary | ICD-10-CM

## 2021-10-06 LAB — POCT INR: INR: 2.1 (ref 2.0–3.0)

## 2021-10-06 NOTE — Patient Instructions (Signed)
Description   ?Take 1 tablet today and then continue on same dosage of Warfarin 1/2 tablet every day except 1 tablet on Mondays, Wednesdays, Fridays.  ?Recheck INR in 3 weeks.  ?Coumadin Clinic # [phone].  ?  ?   ?

## 2021-10-19 ENCOUNTER — Other Ambulatory Visit: Payer: Self-pay | Admitting: Nurse Practitioner

## 2021-10-19 DIAGNOSIS — Z1231 Encounter for screening mammogram for malignant neoplasm of breast: Secondary | ICD-10-CM

## 2021-10-27 ENCOUNTER — Ambulatory Visit (INDEPENDENT_AMBULATORY_CARE_PROVIDER_SITE_OTHER): Payer: Medicare Other

## 2021-10-27 DIAGNOSIS — I480 Paroxysmal atrial fibrillation: Secondary | ICD-10-CM

## 2021-10-27 DIAGNOSIS — Z5181 Encounter for therapeutic drug level monitoring: Secondary | ICD-10-CM

## 2021-10-27 LAB — POCT INR: INR: 2.5 (ref 2.0–3.0)

## 2021-10-27 NOTE — Patient Instructions (Signed)
Description   Take 1 tablet today and then continue on same dosage of Warfarin 1/2 tablet every day except 1 tablet on Mondays, Wednesdays, Fridays.  Recheck INR in 4 weeks.  Coumadin Clinic # [phone].

## 2021-10-28 ENCOUNTER — Ambulatory Visit: Payer: Medicare Other | Admitting: Interventional Cardiology

## 2021-11-16 ENCOUNTER — Ambulatory Visit (INDEPENDENT_AMBULATORY_CARE_PROVIDER_SITE_OTHER): Payer: Medicare Other | Admitting: Pulmonary Disease

## 2021-11-16 ENCOUNTER — Encounter: Payer: Self-pay | Admitting: Pulmonary Disease

## 2021-11-16 VITALS — BP 128/68 | HR 82 | Wt 235.8 lb

## 2021-11-16 DIAGNOSIS — D649 Anemia, unspecified: Secondary | ICD-10-CM

## 2021-11-16 DIAGNOSIS — J9611 Chronic respiratory failure with hypoxia: Secondary | ICD-10-CM

## 2021-11-16 LAB — CBC
HCT: 31.2 % — ABNORMAL LOW (ref 36.0–46.0)
Hemoglobin: 9.7 g/dL — ABNORMAL LOW (ref 12.0–15.0)
MCHC: 31.1 g/dL (ref 30.0–36.0)
MCV: 85.5 fl (ref 78.0–100.0)
Platelets: 269 10*3/uL (ref 150.0–400.0)
RBC: 3.65 Mil/uL — ABNORMAL LOW (ref 3.87–5.11)
RDW: 18.5 % — ABNORMAL HIGH (ref 11.5–15.5)
WBC: 6.2 10*3/uL (ref 4.0–10.5)
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
## 2021-11-16 NOTE — Patient Instructions (Signed)
Nice see you again  No medication changes  We will place a new order for nebulizer through [facility]  I ordered a blood count today to check on your anemia and hemolysis  I will order a sleep study to be done to check and see if you would benefit from noninvasive ventilation, CPAP or BiPAP.  Return to clinic in 3 months or sooner as needed with Dr. Silas Flood

## 2021-11-18 ENCOUNTER — Telehealth: Payer: Self-pay | Admitting: Pulmonary Disease

## 2021-11-18 NOTE — Telephone Encounter (Signed)
Called patient this evening and she states she would like to go over her lab work results. I advised patient that Dr Silas Flood was out of the office till next week but when he gives me the results I will call her and let her know the results.   MH please advise sir.   Thank you

## 2021-11-24 ENCOUNTER — Ambulatory Visit (INDEPENDENT_AMBULATORY_CARE_PROVIDER_SITE_OTHER): Payer: Medicare Other

## 2021-11-24 DIAGNOSIS — Z5181 Encounter for therapeutic drug level monitoring: Secondary | ICD-10-CM | POA: Diagnosis not present

## 2021-11-24 DIAGNOSIS — I480 Paroxysmal atrial fibrillation: Secondary | ICD-10-CM

## 2021-11-24 LAB — POCT INR: INR: 3.1 — AB (ref 2.0–3.0)
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
## 2021-12-04 ENCOUNTER — Encounter: Payer: Self-pay | Admitting: Interventional Cardiology

## 2021-12-04 ENCOUNTER — Ambulatory Visit (INDEPENDENT_AMBULATORY_CARE_PROVIDER_SITE_OTHER): Payer: Medicare Other | Admitting: Interventional Cardiology

## 2021-12-04 VITALS — BP 118/74 | HR 63 | Ht 66.0 in | Wt 237.4 lb

## 2021-12-04 DIAGNOSIS — Z952 Presence of prosthetic heart valve: Secondary | ICD-10-CM | POA: Diagnosis not present

## 2021-12-04 DIAGNOSIS — I480 Paroxysmal atrial fibrillation: Secondary | ICD-10-CM | POA: Diagnosis not present

## 2021-12-04 DIAGNOSIS — I5032 Chronic diastolic (congestive) heart failure: Secondary | ICD-10-CM | POA: Diagnosis not present

## 2021-12-04 DIAGNOSIS — N183 Chronic kidney disease, stage 3 unspecified: Secondary | ICD-10-CM | POA: Diagnosis not present

## 2021-12-04 DIAGNOSIS — J849 Interstitial pulmonary disease, unspecified: Secondary | ICD-10-CM

## 2021-12-04 DIAGNOSIS — I10 Essential (primary) hypertension: Secondary | ICD-10-CM

## 2021-12-04 DIAGNOSIS — T82897D Other specified complication of cardiac prosthetic devices, implants and grafts, subsequent encounter: Secondary | ICD-10-CM

## 2021-12-04 DIAGNOSIS — Z7901 Long term (current) use of anticoagulants: Secondary | ICD-10-CM

## 2021-12-04 DIAGNOSIS — T82897A Other specified complication of cardiac prosthetic devices, implants and grafts, initial encounter: Secondary | ICD-10-CM

## 2021-12-04 NOTE — Patient Instructions (Signed)
Medication Instructions:  Your physician recommends that you continue on your current medications as directed. Please refer to the Current Medication list given to you today.  *If you need a refill on your cardiac medications before your next appointment, please call your pharmacy*  Lab Work: TODAY: BMET If you have labs (blood work) drawn today and your tests are completely normal, you will receive your results only by: [place] (if you have MyChart) OR A paper copy in the mail If you have any lab test that is abnormal or we need to change your treatment, we will call you to review the results.  Testing/Procedures: NONE  Follow-Up: At [facility], you and your health needs are our priority.  As part of our continuing mission to provide you with exceptional heart care, we have created designated Provider Care Teams.  These Care Teams include your primary Cardiologist (physician) and Advanced Practice Providers (APPs -  Physician Assistants and Nurse Practitioners) who all work together to provide you with the care you need, when you need it.  Your next appointment:   6 month(s)  The format for your next appointment:   In Person  Provider:   Sinclair Grooms, MD  or [name], PA-C, Christen Bame, NP, or Richardson Dopp, PA-C   Other Instructions You have been referred to [facility] at [facility]. Their office will reach out to you to schedule an appointment.  Their address and phone number is: [address] [place],  [place]  [address]  [place]

## 2021-12-05 LAB — BASIC METABOLIC PANEL
BUN/Creatinine Ratio: 23 (ref 12–28)
BUN: 32 mg/dL — ABNORMAL HIGH (ref 8–27)
CO2: 28 mmol/L (ref 20–29)
Calcium: 10 mg/dL (ref 8.7–10.3)
Chloride: 100 mmol/L (ref 96–106)
Creatinine, Ser: 1.41 mg/dL — ABNORMAL HIGH (ref 0.57–1.00)
Glucose: 90 mg/dL (ref 70–99)
Potassium: 4.8 mmol/L (ref 3.5–5.2)
Sodium: 141 mmol/L (ref 134–144)
eGFR: 42 mL/min/{1.73_m2} — ABNORMAL LOW (ref >59)

## 2021-12-08 ENCOUNTER — Telehealth: Payer: Self-pay

## 2021-12-08 DIAGNOSIS — Z79899 Other long term (current) drug therapy: Secondary | ICD-10-CM

## 2021-12-08 DIAGNOSIS — I10 Essential (primary) hypertension: Secondary | ICD-10-CM

## 2021-12-08 MED ORDER — FUROSEMIDE 40 MG PO TABS: 80.0000 mg | ORAL_TABLET | Freq: Every day | ORAL | Status: DC

## 2021-12-08 NOTE — Addendum Note (Signed)
Addended by: Molli Barrows on: 12/08/2021 05:14 PM   Modules accepted: Orders

## 2021-12-08 NOTE — Telephone Encounter (Signed)
Spoke with patient and discussed lab results.  Per [name]: Let the patient know the labs suggest kidney stress from diuretic therapy.  Patient verbalized understanding. Patient confirmed she is taking Lasix '80mg'$  daily.

## 2021-12-08 NOTE — Telephone Encounter (Signed)
-----   Message from Belva Crome, MD sent at 12/07/2021  3:32 PM EDT ----- Let the patient know the labs suggest kidney stress from diuretic therapy. A copy will be sent to Stacie Glaze, DO

## 2021-12-08 NOTE — Telephone Encounter (Signed)
Spoke with patient to let her know [name] does want her to continue taking Lasix '80mg'$  daily. He also recommends she have a repeat BMET in 4-6 weeks to check kidney function/electrolytes.  Patient states she will call our office to make a lab appt, offered to make appt for her while she was on the phone but she insisted she will call our office to schedule.  BMET ordered in [place].

## 2021-12-22 ENCOUNTER — Encounter (HOSPITAL_BASED_OUTPATIENT_CLINIC_OR_DEPARTMENT_OTHER): Payer: Medicare Other | Admitting: Pulmonary Disease

## 2021-12-24 ENCOUNTER — Telehealth: Payer: Self-pay

## 2021-12-24 NOTE — Telephone Encounter (Signed)
I received letter from [facility] INR monitoring services and per patient declines and stated will rather continue rechecking her INR with our office.

## 2021-12-29 ENCOUNTER — Ambulatory Visit (INDEPENDENT_AMBULATORY_CARE_PROVIDER_SITE_OTHER): Payer: Medicare Other

## 2021-12-29 DIAGNOSIS — I480 Paroxysmal atrial fibrillation: Secondary | ICD-10-CM

## 2021-12-29 DIAGNOSIS — Z5181 Encounter for therapeutic drug level monitoring: Secondary | ICD-10-CM

## 2021-12-29 LAB — POCT INR: INR: 3.9 — AB (ref 2.0–3.0)

## 2021-12-29 NOTE — Patient Instructions (Signed)
Description   Hold today's dose and then continue on same dosage of Warfarin 1/2 tablet every day except 1 tablet on Mondays, Wednesdays, Fridays.  Recheck INR in 4 weeks.  Coumadin Clinic # [phone].

## 2021-12-30 DIAGNOSIS — F419 Anxiety disorder, unspecified: Secondary | ICD-10-CM | POA: Insufficient documentation

## 2022-01-02 ENCOUNTER — Encounter (HOSPITAL_COMMUNITY): Payer: Self-pay | Admitting: *Deleted

## 2022-01-02 ENCOUNTER — Other Ambulatory Visit: Payer: Self-pay

## 2022-01-02 ENCOUNTER — Emergency Department (HOSPITAL_COMMUNITY): Payer: Medicare Other

## 2022-01-02 ENCOUNTER — Inpatient Hospital Stay (HOSPITAL_COMMUNITY)
Admission: EM | Admit: 2022-01-02 | Discharge: 2022-01-09 | DRG: 378 | Disposition: A | Discharge status: Home / Self Care | Payer: Medicare Other | Attending: Internal Medicine | Admitting: Internal Medicine

## 2022-01-02 DIAGNOSIS — K922 Gastrointestinal hemorrhage, unspecified: Secondary | ICD-10-CM | POA: Diagnosis present

## 2022-01-02 DIAGNOSIS — D509 Iron deficiency anemia, unspecified: Secondary | ICD-10-CM

## 2022-01-02 DIAGNOSIS — M479 Spondylosis, unspecified: Secondary | ICD-10-CM | POA: Diagnosis present

## 2022-01-02 DIAGNOSIS — Z9981 Dependence on supplemental oxygen: Secondary | ICD-10-CM

## 2022-01-02 DIAGNOSIS — Z841 Family history of disorders of kidney and ureter: Secondary | ICD-10-CM

## 2022-01-02 DIAGNOSIS — Z79899 Other long term (current) drug therapy: Secondary | ICD-10-CM

## 2022-01-02 DIAGNOSIS — J84112 Idiopathic pulmonary fibrosis: Secondary | ICD-10-CM | POA: Diagnosis present

## 2022-01-02 DIAGNOSIS — J849 Interstitial pulmonary disease, unspecified: Secondary | ICD-10-CM | POA: Diagnosis present

## 2022-01-02 DIAGNOSIS — K5730 Diverticulosis of large intestine without perforation or abscess without bleeding: Secondary | ICD-10-CM | POA: Diagnosis present

## 2022-01-02 DIAGNOSIS — J9610 Chronic respiratory failure, unspecified whether with hypoxia or hypercapnia: Secondary | ICD-10-CM | POA: Diagnosis present

## 2022-01-02 DIAGNOSIS — M109 Gout, unspecified: Secondary | ICD-10-CM | POA: Diagnosis present

## 2022-01-02 DIAGNOSIS — G4733 Obstructive sleep apnea (adult) (pediatric): Secondary | ICD-10-CM | POA: Diagnosis present

## 2022-01-02 DIAGNOSIS — Z6841 Body Mass Index (BMI) 40.0 and over, adult: Secondary | ICD-10-CM

## 2022-01-02 DIAGNOSIS — D649 Anemia, unspecified: Secondary | ICD-10-CM | POA: Diagnosis not present

## 2022-01-02 DIAGNOSIS — K2970 Gastritis, unspecified, without bleeding: Secondary | ICD-10-CM | POA: Diagnosis present

## 2022-01-02 DIAGNOSIS — E559 Vitamin D deficiency, unspecified: Secondary | ICD-10-CM | POA: Diagnosis present

## 2022-01-02 DIAGNOSIS — E785 Hyperlipidemia, unspecified: Secondary | ICD-10-CM | POA: Diagnosis present

## 2022-01-02 DIAGNOSIS — I5032 Chronic diastolic (congestive) heart failure: Secondary | ICD-10-CM | POA: Diagnosis present

## 2022-01-02 DIAGNOSIS — Z952 Presence of prosthetic heart valve: Secondary | ICD-10-CM

## 2022-01-02 DIAGNOSIS — I480 Paroxysmal atrial fibrillation: Secondary | ICD-10-CM | POA: Diagnosis present

## 2022-01-02 DIAGNOSIS — E538 Deficiency of other specified B group vitamins: Secondary | ICD-10-CM

## 2022-01-02 DIAGNOSIS — F419 Anxiety disorder, unspecified: Secondary | ICD-10-CM | POA: Diagnosis present

## 2022-01-02 DIAGNOSIS — Z823 Family history of stroke: Secondary | ICD-10-CM

## 2022-01-02 DIAGNOSIS — K921 Melena: Secondary | ICD-10-CM | POA: Diagnosis not present

## 2022-01-02 DIAGNOSIS — I130 Hypertensive heart and chronic kidney disease with heart failure and stage 1 through stage 4 chronic kidney disease, or unspecified chronic kidney disease: Secondary | ICD-10-CM | POA: Diagnosis present

## 2022-01-02 DIAGNOSIS — I5030 Unspecified diastolic (congestive) heart failure: Secondary | ICD-10-CM

## 2022-01-02 DIAGNOSIS — Z801 Family history of malignant neoplasm of trachea, bronchus and lung: Secondary | ICD-10-CM

## 2022-01-02 DIAGNOSIS — Z7901 Long term (current) use of anticoagulants: Secondary | ICD-10-CM

## 2022-01-02 DIAGNOSIS — K648 Other hemorrhoids: Secondary | ICD-10-CM | POA: Diagnosis present

## 2022-01-02 DIAGNOSIS — D589 Hereditary hemolytic anemia, unspecified: Secondary | ICD-10-CM | POA: Diagnosis present

## 2022-01-02 DIAGNOSIS — I4821 Permanent atrial fibrillation: Secondary | ICD-10-CM | POA: Diagnosis present

## 2022-01-02 DIAGNOSIS — Z20822 Contact with and (suspected) exposure to covid-19: Secondary | ICD-10-CM | POA: Diagnosis present

## 2022-01-02 DIAGNOSIS — Z87891 Personal history of nicotine dependence: Secondary | ICD-10-CM

## 2022-01-02 DIAGNOSIS — Z5309 Procedure and treatment not carried out because of other contraindication: Secondary | ICD-10-CM | POA: Diagnosis present

## 2022-01-02 DIAGNOSIS — Z833 Family history of diabetes mellitus: Secondary | ICD-10-CM

## 2022-01-02 DIAGNOSIS — J9611 Chronic respiratory failure with hypoxia: Secondary | ICD-10-CM | POA: Diagnosis present

## 2022-01-02 DIAGNOSIS — F32A Depression, unspecified: Secondary | ICD-10-CM | POA: Diagnosis present

## 2022-01-02 DIAGNOSIS — I10 Essential (primary) hypertension: Secondary | ICD-10-CM | POA: Diagnosis present

## 2022-01-02 DIAGNOSIS — N1831 Chronic kidney disease, stage 3a: Secondary | ICD-10-CM | POA: Diagnosis present

## 2022-01-02 DIAGNOSIS — I4892 Unspecified atrial flutter: Secondary | ICD-10-CM | POA: Diagnosis present

## 2022-01-02 DIAGNOSIS — Z8249 Family history of ischemic heart disease and other diseases of the circulatory system: Secondary | ICD-10-CM

## 2022-01-02 DIAGNOSIS — N183 Chronic kidney disease, stage 3 unspecified: Secondary | ICD-10-CM

## 2022-01-02 DIAGNOSIS — Z9884 Bariatric surgery status: Secondary | ICD-10-CM

## 2022-01-02 DIAGNOSIS — D631 Anemia in chronic kidney disease: Secondary | ICD-10-CM | POA: Diagnosis present

## 2022-01-02 DIAGNOSIS — D62 Acute posthemorrhagic anemia: Secondary | ICD-10-CM

## 2022-01-02 DIAGNOSIS — Z825 Family history of asthma and other chronic lower respiratory diseases: Secondary | ICD-10-CM

## 2022-01-02 LAB — TSH: TSH: 2.073 u[IU]/mL (ref 0.350–4.500)

## 2022-01-02 LAB — COMPREHENSIVE METABOLIC PANEL
ALT: 8 U/L (ref 0–44)
AST: 19 U/L (ref 15–41)
Albumin: 3.7 g/dL (ref 3.5–5.0)
Alkaline Phosphatase: 61 U/L (ref 38–126)
Anion gap: 8 (ref 5–15)
BUN: 35 mg/dL — ABNORMAL HIGH (ref 8–23)
CO2: 24 mmol/L (ref 22–32)
Calcium: 9.4 mg/dL (ref 8.9–10.3)
Chloride: 107 mmol/L (ref 98–111)
Creatinine, Ser: 1.3 mg/dL — ABNORMAL HIGH (ref 0.44–1.00)
GFR, Estimated: 46 mL/min — ABNORMAL LOW (ref >60)
Glucose, Bld: 89 mg/dL (ref 70–99)
Potassium: 4.5 mmol/L (ref 3.5–5.1)
Sodium: 139 mmol/L (ref 135–145)
Total Bilirubin: 1.1 mg/dL (ref 0.3–1.2)
Total Protein: 6.7 g/dL (ref 6.5–8.1)

## 2022-01-02 LAB — CBC WITH DIFFERENTIAL/PLATELET
Abs Immature Granulocytes: 0.04 10*3/uL (ref 0.00–0.07)
Basophils Absolute: 0.1 10*3/uL (ref 0.0–0.1)
Basophils Relative: 1 %
Eosinophils Absolute: 0.2 10*3/uL (ref 0.0–0.5)
Eosinophils Relative: 2 %
HCT: 21.7 % — ABNORMAL LOW (ref 36.0–46.0)
Hemoglobin: 6.2 g/dL — CL (ref 12.0–15.0)
Immature Granulocytes: 0 %
Lymphocytes Relative: 9 %
Lymphs Abs: 0.8 10*3/uL (ref 0.7–4.0)
MCH: 24.7 pg — ABNORMAL LOW (ref 26.0–34.0)
MCHC: 28.6 g/dL — ABNORMAL LOW (ref 30.0–36.0)
MCV: 86.5 fL (ref 80.0–100.0)
Monocytes Absolute: 1 10*3/uL (ref 0.1–1.0)
Monocytes Relative: 11 %
Neutro Abs: 7.2 10*3/uL (ref 1.7–7.7)
Neutrophils Relative %: 77 %
Platelets: 219 10*3/uL (ref 150–400)
RBC: 2.51 MIL/uL — ABNORMAL LOW (ref 3.87–5.11)
RDW: 17.4 % — ABNORMAL HIGH (ref 11.5–15.5)
WBC: 9.3 10*3/uL (ref 4.0–10.5)
nRBC: 0 % (ref 0.0–0.2)

## 2022-01-02 LAB — RESP PANEL BY RT-PCR (FLU A&B, COVID) ARPGX2
Influenza A by PCR: NEGATIVE
Influenza B by PCR: NEGATIVE
SARS Coronavirus 2 by RT PCR: NEGATIVE

## 2022-01-02 LAB — PROTIME-INR
INR: 2.9 — ABNORMAL HIGH (ref 0.8–1.2)
Prothrombin Time: 30 seconds — ABNORMAL HIGH (ref 11.4–15.2)

## 2022-01-02 LAB — POC OCCULT BLOOD, ED: Fecal Occult Bld: POSITIVE — AB

## 2022-01-02 LAB — MAGNESIUM: Magnesium: 2.4 mg/dL (ref 1.7–2.4)

## 2022-01-02 LAB — PREPARE RBC (CROSSMATCH)

## 2022-01-02 LAB — TROPONIN I (HIGH SENSITIVITY): Troponin I (High Sensitivity): 13 ng/L (ref <18)

## 2022-01-02 MED ORDER — SODIUM CHLORIDE 0.9 % IV SOLN
10.0000 mL/h | Freq: Once | INTRAVENOUS | Status: AC
  Administered 2022-01-03: 10 mL/h via INTRAVENOUS

## 2022-01-02 MED ORDER — SODIUM CHLORIDE 0.9 % IV BOLUS
500.0000 mL | Freq: Once | INTRAVENOUS | Status: AC
  Administered 2022-01-02: 500 mL via INTRAVENOUS

## 2022-01-02 NOTE — ED Provider Notes (Signed)
[facility] EMERGENCY DEPARTMENT Provider Note   CSN: [id number] Arrival date & time: 01/02/22  1904     History  Chief Complaint  Patient presents with   Fatigue    [name] is a 61 y.o. female with medical history of permanent A-fib (on Coumadin), chronic diastolic heart failure, status post mitral valve replacement, status post aortic valve replacement, chronic respiratory failure with hypoxia (on 4 LPM of O2 via nasal cannula continuously), hypertension, CKD stage III, anemia, hyperlipidemia, obesity.  Presents to the emergency department with complaint of fatigue.  Patient reports that she has felt fatigued and had generalized weakness over the last 2 weeks.  Symptoms have remained constant over this time and remain unchanged.  Patient also reports that she has noticed shortness of breath with exertion over the last 2 weeks which is abnormal for her.  Patient denies any exertional chest pain.  Patient states that today she has noticed that her heart is racing with minimal exertion like walking from her living room to the bathroom.  Patient denies any known sick contacts.  Denies any fever, chills, cough, chest pain, palpitations, leg swelling or tenderness, abdominal pain, nausea, vomiting, diarrhea, blood in stool, melena, dysuria, hematuria, urinary urgency, urinary frequency, vaginal pain, vaginal bleeding, vaginal discharge, headache, visual disturbance, facial asymmetry, numbness, weakness.  HPI     Home Medications Prior to Admission medications   Medication Sig Start Date End Date Taking? Authorizing Provider  acetaminophen (TYLENOL) 650 MG CR tablet Take 1,300 mg by mouth 2 (two) times daily.    [provider]  albuterol (VENTOLIN HFA) 108 (90 Base) MCG/ACT inhaler 2 puffs as needed.    [provider]  allopurinol (ZYLOPRIM) 300 MG tablet Take 300 mg by mouth at bedtime.     [provider]  cetirizine (ZYRTEC) 10 MG  tablet Take 1 tablet by mouth daily.    [provider]  FLUoxetine (PROZAC) 20 MG capsule Take 20 mg by mouth daily.    [provider]  furosemide (LASIX) 40 MG tablet Take 2 tablets (80 mg total) by mouth daily. 12/08/21   Belva Crome, MD  metoprolol tartrate (LOPRESSOR) 50 MG tablet Take 1 tablet (50 mg total) by mouth daily. 03/05/21   [name], MD  montelukast (SINGULAIR) 10 MG tablet Take 1 tablet by mouth daily.    [provider]  OXYGEN Inhale 2 L into the lungs daily as needed (SOB).    [provider]  potassium chloride SA (KLOR-CON M) 20 MEQ tablet Take 1 tablet (20 mEq total) by mouth daily. 06/26/21   Belva Crome, MD  pravastatin (PRAVACHOL) 80 MG tablet Take 80 mg by mouth daily.  08/01/12   [provider]  umeclidinium-vilanterol (ANORO ELLIPTA) 62.5-25 MCG/ACT AEPB Inhale 1 puff into the lungs daily. 09/17/21   Hunsucker, Bonna Gains, MD  warfarin (COUMADIN) 7.5 MG tablet Take 0.5-1 tablets (3.75-7.5 mg total) by mouth See admin instructions. Take 7.'5mg'$  (1 full tablet) Monday and Friday. Take 3.'75mg'$  (half a tablet) Saturday, Tuesday, Wednesday, Thursday, Sunday. INR check once a week 05/19/21   Dwyane Dee, MD      Allergies    Patient has no known allergies.    Review of Systems   Review of Systems  Constitutional:  Positive for fatigue. Negative for chills and fever.  Eyes:  Negative for visual disturbance.  Respiratory:  Positive for shortness of breath. Negative for cough.   Cardiovascular:  Negative for chest pain, palpitations and leg swelling.  Gastrointestinal:  Negative for abdominal distention, abdominal pain, anal bleeding, blood in stool, constipation, diarrhea, nausea, rectal pain and vomiting.  Genitourinary:  Negative for decreased urine volume, difficulty urinating, dysuria, frequency, hematuria, urgency, vaginal bleeding, vaginal discharge and vaginal pain.  Musculoskeletal:  Negative for back pain and  neck pain.  Skin:  Negative for color change and rash.  Neurological:  Negative for dizziness, seizures, syncope, facial asymmetry, speech difficulty, weakness, light-headedness, numbness and headaches.  Psychiatric/Behavioral:  Negative for confusion.     Physical Exam Updated Vital Signs BP (!) 104/47   Pulse 87   Temp 99.2 F (37.3 C) (Oral)   Resp (!) 21   Ht '5\' 6"'$  (1.676 m)   Wt 108.9 kg   SpO2 90%   BMI 38.74 kg/m  Physical Exam Vitals and nursing note reviewed. Chaperone present: Female RN present as Producer, television/film/video.  Constitutional:      General: She is not in acute distress.    Appearance: She is obese. She is not ill-appearing, toxic-appearing or diaphoretic.     Interventions: Nasal cannula in place.     Comments: On 4 LPM of O2 via nasal cannula  HENT:     Head: Normocephalic.  Eyes:     General: No scleral icterus.       Right eye: No discharge.        Left eye: No discharge.  Cardiovascular:     Rate and Rhythm: Normal rate.     Pulses:          Radial pulses are 2+ on the right side and 2+ on the left side.  Pulmonary:     Effort: Pulmonary effort is normal. No tachypnea, bradypnea or respiratory distress.     Breath sounds: Normal breath sounds. No stridor.  Abdominal:     General: Abdomen is protuberant. There is no distension. There are no signs of injury.     Palpations: Abdomen is soft. There is no mass or pulsatile mass.     Tenderness: There is no abdominal tenderness. There is no guarding or rebound.     Hernia: There is no hernia in the umbilical area or ventral area.  Genitourinary:    Rectum: Guaiac result positive. No mass, tenderness, anal fissure, external hemorrhoid or internal hemorrhoid. Normal anal tone.     Comments: Very minimal amount of light brown stool noted to rectal vault.  No melena or frank red blood noted. Musculoskeletal:     Comments: Lymphedema to right lower extremity without erythema, warmth, rash, or tenderness.  Patient  reports this is baseline for her.  Skin:    General: Skin is warm and dry.  Neurological:     General: No focal deficit present.     Mental Status: She is alert and oriented to person, place, and time.     GCS: GCS eye subscore is 4. GCS verbal subscore is 5. GCS motor subscore is 6.  Psychiatric:        Behavior: Behavior is cooperative.     ED Results / Procedures / Treatments   Labs (all labs ordered are listed, but only abnormal results are displayed) Labs Reviewed  COMPREHENSIVE METABOLIC PANEL - Abnormal; Notable for the following components:      Result Value   BUN 35 (*)    Creatinine, Ser 1.30 (*)    GFR, Estimated 46 (*)    All other components within normal limits  CBC WITH DIFFERENTIAL/PLATELET -  Abnormal; Notable for the following components:   RBC 2.51 (*)    Hemoglobin 6.2 (*)    HCT 21.7 (*)    MCH 24.7 (*)    MCHC 28.6 (*)    RDW 17.4 (*)    All other components within normal limits  PROTIME-INR - Abnormal; Notable for the following components:   Prothrombin Time 30.0 (*)    INR 2.9 (*)    All other components within normal limits  POC OCCULT BLOOD, ED - Abnormal; Notable for the following components:   Fecal Occult Bld POSITIVE (*)    All other components within normal limits  RESP PANEL BY RT-PCR (FLU A&B, COVID) ARPGX2  TSH  MAGNESIUM  TYPE AND SCREEN  PREPARE RBC (CROSSMATCH)  TROPONIN I (HIGH SENSITIVITY)  TROPONIN I (HIGH SENSITIVITY)    EKG EKG Interpretation  Date/Time:  Saturday January 02 2022 19:14:43 EDT Ventricular Rate:  87 PR Interval:    QRS Duration: 107 QT Interval:  349 QTC Calculation: 406 R Axis:   42 Text Interpretation: Atrial flutter Repol abnrm suggests ischemia, diffuse leads Possible ST depression V1-V3 Baseline artifact Concerning for possible new ST depressions, though limited due to baseline artifact, no STEMI Confirmed by Oneal Deputy [id number]) on 01/02/2022 7:42:48 PM  Radiology DG Chest Portable 1  View  Result Date: 01/02/2022 CLINICAL DATA:  Chest pain EXAM: PORTABLE CHEST 1 VIEW COMPARISON:  06/09/2021 FINDINGS: Prior median sternotomy and valve replacement. Cardiomegaly. No confluent opacities, effusions or edema. No acute bony abnormality. IMPRESSION: Cardiomegaly.  No active disease. Electronically Signed   By: Rolm Baptise M.D.   On: 01/02/2022 21:07    Procedures .Critical Care  Performed by: Loni Beckwith, PA-C Authorized by: Loni Beckwith, PA-C   Critical care provider statement:    Critical care time (minutes):  30   Critical care was necessary to treat or prevent imminent or life-threatening deterioration of the following conditions:  Circulatory failure   Critical care was time spent personally by me on the following activities:  Development of treatment plan with patient or surrogate, evaluation of patient's response to treatment, examination of patient, ordering and review of laboratory studies, ordering and review of radiographic studies, ordering and performing treatments and interventions, pulse oximetry, re-evaluation of patient's condition, review of old charts and obtaining history from patient or surrogate   Care discussed with: admitting provider       Medications Ordered in ED Medications  0.9 %  sodium chloride infusion (has no administration in time range)  sodium chloride 0.9 % bolus 500 mL (0 mLs Intravenous Stopped 01/02/22 2208)    ED Course/ Medical Decision Making/ A&P Clinical Course as of 01/03/22 0031  Sat Jan 02, 2022  2237 I spoke to hospitalist Dr. Hal Hope who will see the patient for admission. [PB]    Clinical Course User Index [PB] Loni Beckwith, PA-C                           Medical Decision Making Amount and/or Complexity of Data Reviewed Labs: ordered. Radiology: ordered.  Risk Prescription drug management. Decision regarding hospitalization.   Alert 61 year old female no acute distress,  nontoxic-appearing.  Presents to the ED with a chief complaint of fatigue and generalized weakness.  Information was obtained from patient.  I reviewed patient's past medical records including previous prior notes, labs, and imaging.  Patient has medical history as outlined in HPI which complicates her care.  Due to reports of generalized weakness and fatigue will obtain CBC, CMP, INR, ACE level, respiratory panel.  Initial EKG showed concern for depression to V1 through 3, will add on posterior EKG as well as ACS work-up.  I personally viewed and interpreted patient's chest x-ray.  Imaging shows cardiomegaly.  I personally viewed and interpreted patient's lab results.  Pertinent findings include: -Hemoglobin 6.2 -Creatinine 1.3 with BUN of 35; appears baseline for patient. -Troponin 13 -INR 2.9; therapeutic for mechanical valve.  With hemoglobin at 6.2 will obtain Hemoccult.  Patient will need admission for blood transfusion and further work-up of her anemia.  Patient noted to have minimal amount of light brown stool in rectal vault, no melena or frank red blood.  Patient was Hemoccult positive.  Patient care was discussed with attending physician Dr. Ernesto Rutherford.  I spoke with hospitalist Dr. Hal Hope who will see the patient for admission.        Final Clinical Impression(s) / ED Diagnoses Final diagnoses:  Symptomatic anemia    Rx / DC Orders ED Discharge Orders     None         Loni Beckwith, PA-C 01/03/22 0035    Ottie Glazier, DO 01/03/22 1458

## 2022-01-02 NOTE — ED Triage Notes (Signed)
BIB [facility]. EMS from home for general weakness, fatigue, and heart racing, sx ongoing for 2 weeks, denies syncope, NV, sob, or pain. h/o afib, HR was 87, cbg 125, LS CTA, 94% on 4L (which is baseline), BP initially 170/90, but BPs trending soft 99/50 (62). Alert, NAD, calm, interactive, resps e/u.

## 2022-01-03 ENCOUNTER — Encounter (HOSPITAL_COMMUNITY): Payer: Self-pay | Admitting: Internal Medicine

## 2022-01-03 DIAGNOSIS — K922 Gastrointestinal hemorrhage, unspecified: Secondary | ICD-10-CM | POA: Diagnosis not present

## 2022-01-03 DIAGNOSIS — I480 Paroxysmal atrial fibrillation: Secondary | ICD-10-CM

## 2022-01-03 DIAGNOSIS — D649 Anemia, unspecified: Secondary | ICD-10-CM

## 2022-01-03 DIAGNOSIS — I483 Typical atrial flutter: Secondary | ICD-10-CM | POA: Diagnosis not present

## 2022-01-03 DIAGNOSIS — D599 Acquired hemolytic anemia, unspecified: Secondary | ICD-10-CM

## 2022-01-03 DIAGNOSIS — N1831 Chronic kidney disease, stage 3a: Secondary | ICD-10-CM | POA: Diagnosis present

## 2022-01-03 DIAGNOSIS — I5032 Chronic diastolic (congestive) heart failure: Secondary | ICD-10-CM | POA: Diagnosis present

## 2022-01-03 DIAGNOSIS — Z9884 Bariatric surgery status: Secondary | ICD-10-CM | POA: Diagnosis not present

## 2022-01-03 DIAGNOSIS — I110 Hypertensive heart disease with heart failure: Secondary | ICD-10-CM | POA: Diagnosis not present

## 2022-01-03 DIAGNOSIS — Z7901 Long term (current) use of anticoagulants: Secondary | ICD-10-CM

## 2022-01-03 DIAGNOSIS — I4821 Permanent atrial fibrillation: Secondary | ICD-10-CM | POA: Diagnosis present

## 2022-01-03 DIAGNOSIS — J9611 Chronic respiratory failure with hypoxia: Secondary | ICD-10-CM

## 2022-01-03 DIAGNOSIS — F32A Depression, unspecified: Secondary | ICD-10-CM | POA: Diagnosis present

## 2022-01-03 DIAGNOSIS — M479 Spondylosis, unspecified: Secondary | ICD-10-CM | POA: Diagnosis present

## 2022-01-03 DIAGNOSIS — K2970 Gastritis, unspecified, without bleeding: Secondary | ICD-10-CM | POA: Diagnosis present

## 2022-01-03 DIAGNOSIS — K254 Chronic or unspecified gastric ulcer with hemorrhage: Secondary | ICD-10-CM | POA: Diagnosis not present

## 2022-01-03 DIAGNOSIS — D631 Anemia in chronic kidney disease: Secondary | ICD-10-CM | POA: Diagnosis present

## 2022-01-03 DIAGNOSIS — Z9981 Dependence on supplemental oxygen: Secondary | ICD-10-CM | POA: Diagnosis not present

## 2022-01-03 DIAGNOSIS — G4733 Obstructive sleep apnea (adult) (pediatric): Secondary | ICD-10-CM | POA: Diagnosis present

## 2022-01-03 DIAGNOSIS — K5730 Diverticulosis of large intestine without perforation or abscess without bleeding: Secondary | ICD-10-CM | POA: Diagnosis not present

## 2022-01-03 DIAGNOSIS — I509 Heart failure, unspecified: Secondary | ICD-10-CM | POA: Diagnosis not present

## 2022-01-03 DIAGNOSIS — E785 Hyperlipidemia, unspecified: Secondary | ICD-10-CM | POA: Diagnosis present

## 2022-01-03 DIAGNOSIS — Z952 Presence of prosthetic heart valve: Secondary | ICD-10-CM

## 2022-01-03 DIAGNOSIS — J84112 Idiopathic pulmonary fibrosis: Secondary | ICD-10-CM | POA: Diagnosis present

## 2022-01-03 DIAGNOSIS — D62 Acute posthemorrhagic anemia: Secondary | ICD-10-CM | POA: Diagnosis present

## 2022-01-03 DIAGNOSIS — D589 Hereditary hemolytic anemia, unspecified: Secondary | ICD-10-CM | POA: Diagnosis present

## 2022-01-03 DIAGNOSIS — E559 Vitamin D deficiency, unspecified: Secondary | ICD-10-CM | POA: Diagnosis present

## 2022-01-03 DIAGNOSIS — M109 Gout, unspecified: Secondary | ICD-10-CM | POA: Diagnosis present

## 2022-01-03 DIAGNOSIS — Z6841 Body Mass Index (BMI) 40.0 and over, adult: Secondary | ICD-10-CM | POA: Diagnosis not present

## 2022-01-03 DIAGNOSIS — J849 Interstitial pulmonary disease, unspecified: Secondary | ICD-10-CM

## 2022-01-03 DIAGNOSIS — I130 Hypertensive heart and chronic kidney disease with heart failure and stage 1 through stage 4 chronic kidney disease, or unspecified chronic kidney disease: Secondary | ICD-10-CM | POA: Diagnosis present

## 2022-01-03 DIAGNOSIS — D500 Iron deficiency anemia secondary to blood loss (chronic): Secondary | ICD-10-CM | POA: Diagnosis not present

## 2022-01-03 DIAGNOSIS — J45909 Unspecified asthma, uncomplicated: Secondary | ICD-10-CM | POA: Diagnosis not present

## 2022-01-03 DIAGNOSIS — K921 Melena: Secondary | ICD-10-CM | POA: Diagnosis present

## 2022-01-03 DIAGNOSIS — K648 Other hemorrhoids: Secondary | ICD-10-CM | POA: Diagnosis not present

## 2022-01-03 DIAGNOSIS — Z20822 Contact with and (suspected) exposure to covid-19: Secondary | ICD-10-CM | POA: Diagnosis present

## 2022-01-03 DIAGNOSIS — I4892 Unspecified atrial flutter: Secondary | ICD-10-CM | POA: Diagnosis not present

## 2022-01-03 LAB — PROTIME-INR
INR: 2.8 — ABNORMAL HIGH (ref 0.8–1.2)
Prothrombin Time: 29 seconds — ABNORMAL HIGH (ref 11.4–15.2)

## 2022-01-03 LAB — LACTATE DEHYDROGENASE: LDH: 280 U/L — ABNORMAL HIGH (ref 98–192)

## 2022-01-03 LAB — GLUCOSE, CAPILLARY
Glucose-Capillary: 90 mg/dL (ref 70–99)
Glucose-Capillary: 82 mg/dL (ref 70–99)
Glucose-Capillary: 92 mg/dL (ref 70–99)

## 2022-01-03 LAB — HIV ANTIBODY (ROUTINE TESTING W REFLEX): HIV Screen 4th Generation wRfx: NONREACTIVE

## 2022-01-03 LAB — TROPONIN I (HIGH SENSITIVITY): Troponin I (High Sensitivity): 13 ng/L (ref <18)

## 2022-01-03 LAB — HEMOGLOBIN AND HEMATOCRIT, BLOOD
HCT: 26.3 % — ABNORMAL LOW (ref 36.0–46.0)
Hemoglobin: 8 g/dL — ABNORMAL LOW (ref 12.0–15.0)

## 2022-01-03 MED ORDER — ACETAMINOPHEN 650 MG RE SUPP: 650.0000 mg | Freq: Four times a day (QID) | RECTAL | Status: DC | PRN

## 2022-01-03 MED ORDER — PRAVASTATIN SODIUM 40 MG PO TABS
80.0000 mg | ORAL_TABLET | Freq: Every day | ORAL | Status: DC
Start: 2022-01-03 — End: 2022-01-09
  Administered 2022-01-03 – 2022-01-09 (×6): 80 mg via ORAL
  Filled 2022-01-03 (×6): qty 2

## 2022-01-03 MED ORDER — ACETAMINOPHEN 325 MG PO TABS
650.0000 mg | ORAL_TABLET | Freq: Four times a day (QID) | ORAL | Status: DC | PRN
  Administered 2022-01-03 – 2022-01-08 (×4): 650 mg via ORAL
  Filled 2022-01-03 (×4): qty 2

## 2022-01-03 MED ORDER — MONTELUKAST SODIUM 10 MG PO TABS
10.0000 mg | ORAL_TABLET | Freq: Every day | ORAL | Status: DC
  Administered 2022-01-03 – 2022-01-09 (×7): 10 mg via ORAL
  Filled 2022-01-03 (×7): qty 1

## 2022-01-03 MED ORDER — SODIUM CHLORIDE 0.9 % IV SOLN: INTRAVENOUS | Status: DC

## 2022-01-03 MED ORDER — ALLOPURINOL 300 MG PO TABS
150.0000 mg | ORAL_TABLET | Freq: Every day | ORAL | Status: DC
  Administered 2022-01-03 – 2022-01-08 (×6): 150 mg via ORAL
  Filled 2022-01-03 (×6): qty 1

## 2022-01-03 MED ORDER — METOPROLOL TARTRATE 50 MG PO TABS
50.0000 mg | ORAL_TABLET | Freq: Every day | ORAL | Status: DC
  Administered 2022-01-03 – 2022-01-09 (×7): 50 mg via ORAL
  Filled 2022-01-03 (×7): qty 1

## 2022-01-03 MED ORDER — UMECLIDINIUM-VILANTEROL 62.5-25 MCG/ACT IN AEPB
1.0000 | INHALATION_SPRAY | Freq: Every day | RESPIRATORY_TRACT | Status: DC
Start: 2022-01-03 — End: 2022-01-09
  Administered 2022-01-03 – 2022-01-09 (×7): 1 via RESPIRATORY_TRACT
  Filled 2022-01-03: qty 14

## 2022-01-03 MED ORDER — PANTOPRAZOLE SODIUM 40 MG IV SOLR
40.0000 mg | Freq: Two times a day (BID) | INTRAVENOUS | Status: DC
  Administered 2022-01-03 – 2022-01-06 (×7): 40 mg via INTRAVENOUS
  Filled 2022-01-03 (×8): qty 10

## 2022-01-03 MED ORDER — FUROSEMIDE 40 MG PO TABS
80.0000 mg | ORAL_TABLET | Freq: Every day | ORAL | Status: DC
Start: 2022-01-03 — End: 2022-01-04
  Administered 2022-01-03: 80 mg via ORAL
  Filled 2022-01-03: qty 2

## 2022-01-03 MED ORDER — FLUOXETINE HCL 20 MG PO CAPS
20.0000 mg | ORAL_CAPSULE | Freq: Every day | ORAL | Status: DC
  Administered 2022-01-03 – 2022-01-09 (×7): 20 mg via ORAL
  Filled 2022-01-03 (×7): qty 1
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
## 2022-01-03 NOTE — Progress Notes (Signed)
PROGRESS NOTE  [name] WJX:[id number] DOB: 1960-10-13   PCP: Sue Lush, PA-C  Patient is from: Home.  Lives with sister.  Independently ambulates at baseline.  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  Hgb 6.2 (from 9.73 months ago).  Hemoccult positive.  Hemodynamically stable.  EKG showed rate controlled atrial flutter.  On further questioning, she reports melena but no hematochezia.  Denies NSAID use.   Patient was transfused 2 units with appropriate response.  [facility] consulted and planning EGD on 8/7.  Subjective: Seen and examined earlier this morning.  No major events overnight of this morning.  Feels better after blood transfusion.  Denies chest pain, shortness of breath, nausea, vomiting or abdominal pain.  She admits to melena but no hematochezia.  She denies NSAID use.  Objective: Vitals:   01/03/22 0525 01/03/22 0643 01/03/22 0758 01/03/22 0900  BP: 130/86 138/73  135/75  Pulse: (!) 53 69  67  Resp: '12 17  15  '$ Temp: 97.8 F (36.6 C) 97.8 F (36.6 C)  98 F (36.7 C)  TempSrc: Oral Oral  Oral  SpO2: 95% 97% 96% 97%  Weight: 111.6 kg     Height:        Examination:  GENERAL: No apparent distress.  Nontoxic. HEENT: MMM.  Vision and hearing grossly intact.  NECK: Supple.  No apparent JVD.  RESP:  No IWOB.  Fair aeration bilaterally. CVS:  RRR.  Mechanical heart sound. ABD/GI/GU: BS+. Abd soft, NTND.  MSK/EXT:  Moves extremities. No apparent deformity. No edema.  SKIN: no apparent skin lesion or wound NEURO: Awake, alert and oriented appropriately.   No apparent focal neuro deficit. PSYCH: Calm. Normal affect.   Procedures:  None  Microbiology summarized: None  Assessment and plan: Principal Problem:   Acute GI bleeding Active Problems:   ILD (interstitial lung disease) (HCC)   Symptomatic anemia   CKD (chronic kidney disease) stage 3, GFR 30-59 ml/min (HCC)   Hx of mechanical aortic valve replacement   PAF (paroxysmal atrial fibrillation) (HCC)   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HCC)  Acute on chronic symptomatic blood loss anemia due to GI bleed-patient reports melena suggesting upper GI bleed.  She is on warfarin for mechanical heart valve and paroxysmal A-fib.  Hemoccult positive.  She also has history of hemolytic anemia.  LDH elevated to 280 but about baseline.  INR therapeutic.  Transfused 2 units with appropriate response Recent Labs    05/14/21 1017 05/14/21 1018 05/15/21 0201 05/16/21 0220 05/19/21 0552 07/21/21 0000 09/01/21 1408 11/16/21 1437 01/02/22 2100 01/03/22 0922  HGB 10.5* 10.9* 10.4* 9.1* 9.2* 9.9* 10.0* 9.7* 6.2* 8.0*  -Monitor H&H, check anemia panel and follow haptoglobin -Continue holding warfarin -Continue IV Protonix 40 mg twice daily -Plan for EGD on 8/7.  History of mechanical aortic and mitral valve: per cardiothoracic surgeon at [facility] not a candidate for redo surgery.  On warfarin at home.  INR therapeutic at 2.8. -Continue holding warfarin -We will start anticoagulation with IV heparin once INR is subtherapeutic, <2.5.  Paroxysmal A-fib/a flutter: EKG suggests rate controlled a flutter. -Continue  home metoprolol -Anticoagulation as above  Chronic diastolic CHF:  Appears euvolemic on exam.  On Lasix 80 mg daily.  TTE in 04/2021 with LVEF of 55 to 60%, indeterminate DD.  -Continue home Lasix  Interstitial lung disease/chronic hypoxic RF: On 4 L at baseline.  Stable. -Continue home inhalers  CKD-3A: Relatively stable. Recent Labs    05/12/21 1520 05/13/21 0518 05/14/21 0446 05/16/21 0220  05/17/21 0400 05/18/21 0122 05/19/21 0552 06/23/21 1354 09/01/21 1408 12/04/21 1453 01/02/22 2100  BUN '12 10 10 16 14 18  '$ --  21 26 32* 35*  CREATININE 0.78 0.86 0.98 1.04* 1.02* 1.27* 1.09* 1.19* 1.25* 1.41* 1.30*  -Continue monitoring  OSA: Does not seem to be on CPAP at home. -Continue home oxygen  Anxiety and depression: Stable. -Continue home Prozac    History of gastric bypass: -Check anemia panel  Morbid obesity Body mass index is 39.71 kg/m.           DVT prophylaxis:    None with therapeutic INR.  Code Status: Full code Family Communication: None at bedside Level of care: Telemetry Medical Status is: Inpatient Remains inpatient appropriate because: Acute on chronic symptomatic blood loss anemia due to GI bleed inpatient with anticoagulation   Final disposition: Likely home once medically cleared Consultants:  Gastroenterology  Sch Meds:  Scheduled Meds:  metoprolol tartrate  50 mg Oral Daily   pantoprazole (PROTONIX) IV  40 mg Intravenous Q12H   umeclidinium-vilanterol  1 puff Inhalation Daily   Continuous Infusions: PRN Meds:.acetaminophen **OR** acetaminophen  Antimicrobials: Anti-infectives (From admission, onward)    None        I have personally reviewed the following labs and images: CBC: Recent Labs  Lab 01/02/22 2100 01/03/22 0922  WBC 9.3  --   NEUTROABS 7.2  --   HGB 6.2* 8.0*  HCT 21.7* 26.3*  MCV 86.5  --   PLT 219  --    BMP &GFR Recent Labs  Lab 01/02/22 2100  NA 139  K 4.5  CL 107  CO2 24  GLUCOSE 89  BUN 35*  CREATININE 1.30*  CALCIUM 9.4  MG 2.4   Estimated Creatinine Clearance: 56.8 mL/min (A) (by C-G formula based on SCr of 1.3 mg/dL (H)). Liver & Pancreas: Recent Labs  Lab 01/02/22 2100  AST 19  ALT 8  ALKPHOS 61  BILITOT 1.1  PROT 6.7  ALBUMIN 3.7   No results for input(s): "LIPASE", "AMYLASE" in the last 168 hours. No results for input(s): "AMMONIA" in the last 168  hours. Diabetic: No results for input(s): "HGBA1C" in the last 72 hours. Recent Labs  Lab 01/03/22 0639 01/03/22 1143  GLUCAP 90 82   Cardiac Enzymes: No results for input(s): "CKTOTAL", "CKMB", "CKMBINDEX", "TROPONINI" in the last 168 hours. Recent Labs    09/01/21 1408  PROBNP 2,651*   Coagulation Profile: Recent Labs  Lab 12/29/21 1139 01/02/22 2100 01/03/22 0922  INR 3.9* 2.9* 2.8*   Thyroid Function Tests: Recent Labs    01/02/22 2100  TSH 2.073   Lipid Profile: No results for input(s): "CHOL", "HDL", "LDLCALC", "TRIG", "CHOLHDL", "LDLDIRECT" in the last 72 hours. Anemia Panel: No results for input(s): "VITAMINB12", "FOLATE", "FERRITIN", "TIBC", "IRON", "RETICCTPCT" in the last 72 hours. Urine analysis:    Component Value Date/Time   COLORURINE YELLOW 02/26/2021 1620   APPEARANCEUR CLEAR 02/26/2021 1620   LABSPEC 1.010 02/26/2021 1620   PHURINE 5.0 02/26/2021 1620   GLUCOSEU NEGATIVE 02/26/2021 1620   HGBUR SMALL (A) 02/26/2021  [place] 02/26/2021 [place] 02/26/2021 1620   PROTEINUR 30 (A) 02/26/2021 1620   UROBILINOGEN 0.2 04/08/2015 0317   NITRITE NEGATIVE 02/26/2021 1620   LEUKOCYTESUR NEGATIVE 02/26/2021 1620   Sepsis Labs: Invalid input(s): "PROCALCITONIN", "LACTICIDVEN"  Microbiology: Recent Results (from the past 240 hour(s))  Resp Panel by RT-PCR (Flu A&B, Covid) Anterior Nasal Swab     Status: None   Collection Time: 01/02/22 10:00 PM   Specimen: Anterior Nasal Swab  Result Value Ref Range Status   SARS Coronavirus 2 by RT PCR NEGATIVE NEGATIVE Final    Comment: (NOTE) SARS-CoV-2 target nucleic acids are NOT DETECTED.  The SARS-CoV-2 RNA is generally detectable in upper respiratory specimens during the acute phase of infection. The lowest concentration of SARS-CoV-2 viral copies this assay can detect is 138 copies/mL. A negative result does not preclude SARS-Cov-2 infection and should not be used as  the sole basis for treatment or other patient management decisions. A negative result may occur with  improper specimen collection/handling, submission of specimen other than nasopharyngeal swab, presence of viral mutation(s) within the areas targeted by this assay, and inadequate number of viral copies(<138 copies/mL). A negative result must be combined with clinical observations, patient history, and epidemiological information. The expected result is Negative.  Fact Sheet for Patients:  EntrepreneurPulse.com.au  Fact Sheet for Healthcare Providers:  IncredibleEmployment.be  This test is no t yet approved or cleared by the [place] FDA and  has been authorized for detection and/or diagnosis of SARS-CoV-2 by FDA under an Emergency Use Authorization (EUA). This EUA will remain  in effect (meaning this test can be used) for the duration of the COVID-19 declaration under Section 564(b)(1) of the Act, 21 U.S.C.section 360bbb-3(b)(1), unless the authorization is terminated  or revoked sooner.       Influenza A by PCR NEGATIVE NEGATIVE Final   Influenza B by PCR NEGATIVE NEGATIVE Final    Comment: (NOTE) The Xpert Xpress SARS-CoV-2/FLU/RSV plus assay is intended as an aid in the diagnosis of influenza from Nasopharyngeal swab specimens and should not be used as a sole basis for treatment. Nasal washings and aspirates are unacceptable for Xpert Xpress SARS-CoV-2/FLU/RSV testing.  Fact Sheet for Patients: EntrepreneurPulse.com.au  Fact Sheet for Healthcare Providers: IncredibleEmployment.be  This test is not yet approved or cleared by the [place] FDA and has been authorized for detection and/or diagnosis of SARS-CoV-2 by FDA under an Emergency Use Authorization (EUA). This EUA will remain in effect (meaning this test can be used) for the duration of the COVID-19 declaration under Section 564(b)(1) of  the Act, 21 U.S.C. section 360bbb-3(b)(1), unless the authorization is terminated or revoked.  Performed at [facility], [place] [address], [place], [place] [address]     Radiology Studies: DG Chest Portable 1 View  Result Date: 01/02/2022 CLINICAL DATA:  Chest pain EXAM: PORTABLE CHEST 1 VIEW COMPARISON:  06/09/2021 FINDINGS: Prior median sternotomy and valve replacement. Cardiomegaly. No confluent opacities, effusions or edema. No acute bony abnormality. IMPRESSION: Cardiomegaly.  No active disease. Electronically Signed   By: Rolm Baptise M.D.   On: 01/02/2022 21:07      Krisa [facility] [place]  If 7PM-7AM, please contact night-coverage [email or web address] 01/03/2022, 12:07 PM
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
## 2022-01-04 DIAGNOSIS — K922 Gastrointestinal hemorrhage, unspecified: Secondary | ICD-10-CM | POA: Diagnosis not present

## 2022-01-04 DIAGNOSIS — E538 Deficiency of other specified B group vitamins: Secondary | ICD-10-CM

## 2022-01-04 DIAGNOSIS — D649 Anemia, unspecified: Secondary | ICD-10-CM | POA: Diagnosis not present

## 2022-01-04 DIAGNOSIS — D62 Acute posthemorrhagic anemia: Secondary | ICD-10-CM | POA: Diagnosis not present

## 2022-01-04 DIAGNOSIS — D599 Acquired hemolytic anemia, unspecified: Secondary | ICD-10-CM | POA: Diagnosis not present

## 2022-01-04 DIAGNOSIS — D509 Iron deficiency anemia, unspecified: Secondary | ICD-10-CM

## 2022-01-04 LAB — RENAL FUNCTION PANEL
Albumin: 3.8 g/dL (ref 3.5–5.0)
Anion gap: 5 (ref 5–15)
BUN: 32 mg/dL — ABNORMAL HIGH (ref 8–23)
CO2: 26 mmol/L (ref 22–32)
Calcium: 9.6 mg/dL (ref 8.9–10.3)
Chloride: 109 mmol/L (ref 98–111)
Creatinine, Ser: 1.4 mg/dL — ABNORMAL HIGH (ref 0.44–1.00)
GFR, Estimated: 43 mL/min — ABNORMAL LOW (ref >60)
Glucose, Bld: 93 mg/dL (ref 70–99)
Phosphorus: 4.4 mg/dL (ref 2.5–4.6)
Potassium: 3.9 mmol/L (ref 3.5–5.1)
Sodium: 140 mmol/L (ref 135–145)

## 2022-01-04 LAB — HEPARIN LEVEL (UNFRACTIONATED)
Heparin Unfractionated: <0.1 IU/mL — ABNORMAL LOW (ref 0.30–0.70)
Heparin Unfractionated: 0.35 IU/mL (ref 0.30–0.70)

## 2022-01-04 LAB — TYPE AND SCREEN
ABO/RH(D): O POS
Antibody Screen: NEGATIVE
Unit division: 0
Unit division: 0

## 2022-01-04 LAB — CBC
HCT: 26.5 % — ABNORMAL LOW (ref 36.0–46.0)
Hemoglobin: 8 g/dL — ABNORMAL LOW (ref 12.0–15.0)
MCH: 25.9 pg — ABNORMAL LOW (ref 26.0–34.0)
MCHC: 30.2 g/dL (ref 30.0–36.0)
MCV: 85.8 fL (ref 80.0–100.0)
Platelets: 240 10*3/uL (ref 150–400)
RBC: 3.09 MIL/uL — ABNORMAL LOW (ref 3.87–5.11)
RDW: 17.5 % — ABNORMAL HIGH (ref 11.5–15.5)
WBC: 7 10*3/uL (ref 4.0–10.5)
nRBC: 0.3 % — ABNORMAL HIGH (ref 0.0–0.2)

## 2022-01-04 LAB — GLUCOSE, CAPILLARY
Glucose-Capillary: 87 mg/dL (ref 70–99)
Glucose-Capillary: 90 mg/dL (ref 70–99)
Glucose-Capillary: 86 mg/dL (ref 70–99)
Glucose-Capillary: 119 mg/dL — ABNORMAL HIGH (ref 70–99)

## 2022-01-04 LAB — MAGNESIUM: Magnesium: 2.1 mg/dL (ref 1.7–2.4)

## 2022-01-04 LAB — PROTIME-INR
INR: 2.3 — ABNORMAL HIGH (ref 0.8–1.2)
Prothrombin Time: 25.5 seconds — ABNORMAL HIGH (ref 11.4–15.2)

## 2022-01-04 LAB — BPAM RBC
Blood Product Expiration Date: 202309022359
Blood Product Expiration Date: 202309022359
ISSUE DATE / TIME: 202308052349
ISSUE DATE / TIME: 202308060343
Unit Type and Rh: 5100
Unit Type and Rh: 5100

## 2022-01-04 LAB — RETICULOCYTES
Immature Retic Fract: 31.3 % — ABNORMAL HIGH (ref 2.3–15.9)
RBC.: 3.13 MIL/uL — ABNORMAL LOW (ref 3.87–5.11)
Retic Count, Absolute: 106.1 10*3/uL (ref 19.0–186.0)
Retic Ct Pct: 3.4 % — ABNORMAL HIGH (ref 0.4–3.1)

## 2022-01-04 LAB — IRON AND TIBC
Iron: 24 ug/dL — ABNORMAL LOW (ref 28–170)
Saturation Ratios: 5 % — ABNORMAL LOW (ref 10.4–31.8)
TIBC: 452 ug/dL — ABNORMAL HIGH (ref 250–450)
UIBC: 428 ug/dL

## 2022-01-04 LAB — HAPTOGLOBIN: Haptoglobin: <10 mg/dL — ABNORMAL LOW (ref 37–355)

## 2022-01-04 LAB — FOLATE: Folate: 20.8 ng/mL (ref >5.9)

## 2022-01-04 LAB — VITAMIN B12: Vitamin B-12: 163 pg/mL — ABNORMAL LOW (ref 180–914)

## 2022-01-04 LAB — FERRITIN: Ferritin: 11 ng/mL (ref 11–307)

## 2022-01-04 MED ORDER — SODIUM CHLORIDE 0.9 % IV SOLN
250.0000 mg | Freq: Every day | INTRAVENOUS | Status: AC
  Administered 2022-01-04 – 2022-01-05 (×2): 250 mg via INTRAVENOUS
  Filled 2022-01-04 (×2): qty 20

## 2022-01-04 MED ORDER — HEPARIN (PORCINE) 25000 UT/250ML-% IV SOLN
1200.0000 [IU]/h | INTRAVENOUS | Status: AC
Start: 2022-01-04 — End: 2022-01-05
  Administered 2022-01-04: 1000 [IU]/h via INTRAVENOUS
  Administered 2022-01-05: 1200 [IU]/h via INTRAVENOUS
  Filled 2022-01-04 (×2): qty 250

## 2022-01-04 MED ORDER — CYANOCOBALAMIN 1000 MCG/ML IJ SOLN
1000.0000 ug | Freq: Every day | INTRAMUSCULAR | Status: AC
  Administered 2022-01-04 – 2022-01-05 (×2): 1000 ug via INTRAMUSCULAR
  Filled 2022-01-04 (×4): qty 1

## 2022-01-04 MED ORDER — VITAMIN B-12 1000 MCG PO TABS
1000.0000 ug | ORAL_TABLET | Freq: Every day | ORAL | Status: DC
  Administered 2022-01-06 – 2022-01-09 (×4): 1000 ug via ORAL
  Filled 2022-01-04 (×4): qty 1

## 2022-01-04 NOTE — Plan of Care (Signed)
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
## 2022-01-04 NOTE — Progress Notes (Signed)
Rate dose change of heparin to 12 ml/hr verified by Glory Buff RN

## 2022-01-04 NOTE — Progress Notes (Signed)
PROGRESS NOTE  [name] VFI:[id number] DOB: [birth year]   PCP: Sue Lush, PA-C  Patient is from: Home.  Lives with sister.  Independently ambulates at baseline.  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  Hgb 6.2 (from 9.73 months ago).  Hemoccult positive.  Hemodynamically stable.  EKG showed rate controlled atrial flutter.  On further questioning, she reports melena but no hematochezia.  Denies NSAID use.   Patient was transfused 2 units with appropriate response.  H&H stable since then.  EGD postponed to 8/8 due to elevated INR.  Subjective: Seen and examined earlier this morning.  No major events overnight of this morning.  No complaints.  Feels well.  Objective: Vitals:   01/04/22 0749 01/04/22 0821 01/04/22 1141 01/04/22 1516  BP: 126/61  133/82 115/69  Pulse: 62  62 88  Resp: '16  18 17  '$ Temp: 97.7 F (36.5 C)  97.7 F (36.5 C) (!) 97.5 F (36.4 C)  TempSrc: Oral  Oral Oral  SpO2: 97% 95% 93% 93%  Weight:      Height:        Examination:  GENERAL: No apparent distress.  Nontoxic. HEENT: MMM.  Vision and hearing grossly intact.  NECK: Supple.  No apparent JVD.  RESP:  No IWOB.  Fair aeration bilaterally. CVS:  RRR. Heart sounds normal.  ABD/GI/GU: BS+. Abd soft, NTND.  MSK/EXT:  Moves extremities. No apparent deformity. No edema.  SKIN: no apparent skin lesion or wound NEURO: Awake and alert. Oriented appropriately.  No apparent focal neuro deficit. PSYCH: Calm. Normal affect.   Procedures:  None  Microbiology summarized: None  Assessment and  plan: Principal Problem:   Acute GI bleeding Active Problems:   Essential hypertension   ILD (interstitial lung disease) (HCC)   Atrial flutter (HCC)   Symptomatic anemia   Chronic kidney disease, stage 3a (HCC)   Chronic respiratory failure (HCC)   Hx of mechanical aortic valve replacement   Morbid obesity (HCC)   PAF (paroxysmal atrial fibrillation) (HCC)   Chronic anticoagulation   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HCC)   Chronic diastolic CHF (congestive heart failure) (HCC)   Acute on chronic blood loss anemia  Acute on chronic symptomatic blood loss anemia due to GI bleed-patient reports melena suggesting upper GI bleed.  She is on warfarin for mechanical heart valve and paroxysmal A-fib.  Hemoccult positive.  She also has history of hemolytic anemia.  LDH elevated to 280 but about baseline.  INR therapeutic.  Anemia panel with iron deficiency and B12 deficiency.  Transfused 2 units with appropriate response.  H&H stable. Recent Labs    05/14/21 1018 05/15/21 0201 05/16/21 0220 05/19/21 0552 07/21/21 0000 09/01/21 1408 11/16/21 1437 01/02/22 2100 01/03/22 0922 01/04/22 0413  HGB 10.9* 10.4* 9.1* 9.2* 9.9* 10.0* 9.7* 6.2* 8.0* 8.0*  -Monitor H&H, check anemia panel and follow haptoglobin -Continue holding warfarin -Continue IV Protonix 40 mg twice daily -Vitamin B12 injection daily x 2 followed by p.o. -IV ferric gluconate 250 mg daily x 2 -EGD postponed to 8/8 due to elevated INR.  History of mechanical aortic and mitral  valve: per cardiothoracic surgeon at [facility] not a candidate for redo surgery.  On warfarin at home.  INR 2.3. -Continue holding warfarin -IV heparin for anticoagulation  Paroxysmal A-fib/a flutter: EKG suggests rate controlled a flutter. -Continue home metoprolol -Anticoagulation as above  Chronic diastolic CHF:  Appears euvolemic on exam.  On Lasix 80 mg daily.  TTE in 04/2021 with LVEF of 55 to 60%, indeterminate DD.  Creatinine slightly. -Hold p.o.  Lasix -Monitor intake and output, renal functions and electrolytes  Interstitial lung disease/chronic hypoxic RF: On 4 L at baseline.  Saturating in upper 90s on 2 L by [place]. -Continue home inhalers  CKD-3A: Relatively stable. Recent Labs    05/13/21 0518 05/14/21 0446 05/16/21 0220 05/17/21 0400 05/18/21 0122 05/19/21 0552 06/23/21 1354 09/01/21 1408 12/04/21 1453 01/02/22 2100 01/04/22 0413  BUN '10 10 16 14 18  '$ --  21 26 32* 35* 32*  CREATININE 0.86 0.98 1.04* 1.02* 1.27* 1.09* 1.19* 1.25* 1.41* 1.30* 1.40*  -Hold Lasix -Continue monitoring  OSA: Does not seem to be on CPAP at home. -Continue home oxygen  Anxiety and depression: Stable. -Continue home Prozac  History of gastric bypass: -Check anemia panel  Morbid obesity Body mass index is 39.22 kg/m.           DVT prophylaxis:  On full dose anticoagulation.  Code Status: Full code Family Communication: None at bedside Level of care: Telemetry Medical Status is: Inpatient Remains inpatient appropriate because: Acute on chronic symptomatic blood loss anemia due to GI bleed inpatient with anticoagulation   Final disposition: Likely home once medically cleared Consultants:  Gastroenterology  Sch Meds:  Scheduled Meds:  allopurinol  150 mg Oral QHS   cyanocobalamin  1,000 mcg Intramuscular Daily   Followed by   Derrill Memo ON 01/06/2022] vitamin B-12  1,000 mcg Oral Daily   FLUoxetine  20 mg Oral Daily   furosemide  80 mg Oral Daily   metoprolol tartrate  50 mg Oral Daily   montelukast  10 mg Oral Daily   pantoprazole (PROTONIX) IV  40 mg Intravenous Q12H   pravastatin  80 mg Oral Daily   umeclidinium-vilanterol  1 puff Inhalation Daily   Continuous Infusions:  sodium chloride 20 mL/hr at 01/04/22 0657   ferric gluconate (FERRLECIT) IVPB     heparin 1,000 Units/hr (01/04/22 0657)   PRN Meds:.acetaminophen **OR** acetaminophen  Antimicrobials: Anti-infectives (From admission, onward)    None         I have personally reviewed the following labs and images: CBC: Recent Labs  Lab 01/02/22 2100 01/03/22 0922 01/04/22 0413  WBC 9.3  --  7.0  NEUTROABS 7.2  --   --   HGB 6.2* 8.0* 8.0*  HCT 21.7* 26.3* 26.5*  MCV 86.5  --  85.8  PLT 219  --  240   BMP &GFR Recent Labs  Lab 01/02/22 2100 01/04/22 0413  NA 139 140  K 4.5 3.9  CL 107 109  CO2 24 26  GLUCOSE 89 93  BUN 35* 32*  CREATININE 1.30* 1.40*  CALCIUM 9.4 9.6  MG 2.4 2.1  PHOS  --  4.4   Estimated Creatinine Clearance: 52.4 mL/min (A) (by C-G formula based on SCr of 1.4 mg/dL (H)). Liver & Pancreas: Recent Labs  Lab 01/02/22 2100 01/04/22 0413  AST 19  --   ALT 8  --   ALKPHOS 61  --   BILITOT 1.1  --   PROT 6.7  --   ALBUMIN 3.7  3.8   No results for input(s): "LIPASE", "AMYLASE" in the last 168 hours. No results for input(s): "AMMONIA" in the last 168 hours. Diabetic: No results for input(s): "HGBA1C" in the last 72 hours. Recent Labs  Lab 01/03/22 1143 01/03/22 1904 01/04/22 0621 01/04/22 0903 01/04/22 1121  GLUCAP 82 92 87 90 86   Cardiac Enzymes: No results for input(s): "CKTOTAL", "CKMB", "CKMBINDEX", "TROPONINI" in the last 168 hours. Recent Labs    09/01/21 1408  PROBNP 2,651*   Coagulation Profile: Recent Labs  Lab 12/29/21 1139 01/02/22 2100 01/03/22 0922 01/04/22 0413  INR 3.9* 2.9* 2.8* 2.3*   Thyroid Function Tests: Recent Labs    01/02/22 2100  TSH 2.073   Lipid Profile: No results for input(s): "CHOL", "HDL", "LDLCALC", "TRIG", "CHOLHDL", "LDLDIRECT" in the last 72 hours. Anemia Panel: Recent Labs    01/04/22 0413  VITAMINB12 163*  FOLATE 20.8  FERRITIN 11  TIBC 452*  IRON 24*  RETICCTPCT 3.4*   Urine analysis:    Component Value Date/Time   COLORURINE YELLOW 02/26/2021 1620   APPEARANCEUR CLEAR 02/26/2021 1620   LABSPEC 1.010 02/26/2021 1620   PHURINE 5.0 02/26/2021 1620   GLUCOSEU NEGATIVE 02/26/2021 1620   HGBUR SMALL (A) 02/26/2021 1620    BILIRUBINUR NEGATIVE 02/26/2021 1620   KETONESUR NEGATIVE 02/26/2021 1620   PROTEINUR 30 (A) 02/26/2021 1620   UROBILINOGEN 0.2 04/08/2015 0317   NITRITE NEGATIVE 02/26/2021 1620   LEUKOCYTESUR NEGATIVE 02/26/2021 1620   Sepsis Labs: Invalid input(s): "PROCALCITONIN", "LACTICIDVEN"  Microbiology: Recent Results (from the past 240 hour(s))  Resp Panel by RT-PCR (Flu A&B, Covid) Anterior Nasal Swab     Status: None   Collection Time: 01/02/22 10:00 PM   Specimen: Anterior Nasal Swab  Result Value Ref Range Status   SARS Coronavirus 2 by RT PCR NEGATIVE NEGATIVE Final    Comment: (NOTE) SARS-CoV-2 target nucleic acids are NOT DETECTED.  The SARS-CoV-2 RNA is generally detectable in upper respiratory specimens during the acute phase of infection. The lowest concentration of SARS-CoV-2 viral copies this assay can detect is 138 copies/mL. A negative result does not preclude SARS-Cov-2 infection and should not be used as the sole basis for treatment or other patient management decisions. A negative result may occur with  improper specimen collection/handling, submission of specimen other than nasopharyngeal swab, presence of viral mutation(s) within the areas targeted by this assay, and inadequate number of viral copies(<138 copies/mL). A negative result must be combined with clinical observations, patient history, and epidemiological information. The expected result is Negative.  Fact Sheet for Patients:  EntrepreneurPulse.com.au  Fact Sheet for Healthcare Providers:  IncredibleEmployment.be  This test is no t yet approved or cleared by the [place] FDA and  has been authorized for detection and/or diagnosis of SARS-CoV-2 by FDA under an Emergency Use Authorization (EUA). This EUA will remain  in effect (meaning this test can be used) for the duration of the COVID-19 declaration under Section 564(b)(1) of the Act, 21 U.S.C.section  360bbb-3(b)(1), unless the authorization is terminated  or revoked sooner.       Influenza A by PCR NEGATIVE NEGATIVE Final   Influenza B by PCR NEGATIVE NEGATIVE Final    Comment: (NOTE) The Xpert Xpress SARS-CoV-2/FLU/RSV plus assay is intended as an aid in the diagnosis of influenza from Nasopharyngeal swab specimens and should not be used as a sole basis for treatment. Nasal washings and aspirates are unacceptable for Xpert Xpress SARS-CoV-2/FLU/RSV testing.  Fact Sheet for Patients: EntrepreneurPulse.com.au  Fact Sheet for Healthcare Providers: IncredibleEmployment.be  This test is not yet approved or cleared by the [place] FDA and has been authorized for detection and/or diagnosis of SARS-CoV-2 by FDA under an Emergency Use Authorization (EUA). This EUA will remain in effect (meaning this test can be used) for the duration of the COVID-19 declaration under Section 564(b)(1) of the Act, 21 U.S.C. section 360bbb-3(b)(1), unless the authorization is terminated or revoked.  Performed at [facility], [facility] [address], [place], [place] [address]     Radiology Studies: No results found.    Virdell Hoiland T. [place]  If 7PM-7AM, please contact night-coverage [email or web address] 01/04/2022, 3:56 PM

## 2022-01-04 NOTE — Progress Notes (Signed)
INR 2.3.  Procedure canceled.  Plan EGD tomorrow if INR </= 1.8.  Hold heparin drip 4 hours pre-endoscopy.
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
## 2022-01-04 NOTE — Progress Notes (Signed)
   01/04/22 0900  Mobility  Activity Ambulated with assistance in hallway  Level of [place] wheel walker  Distance Ambulated (ft) 250 ft  Activity Response Tolerated well  $Mobility charge 1 Mobility   Mobility Specialist Progress Note  Pre-Mobility: 64 HR, 146/57 BP  Pt was in bed and agreeable. On 2L of O2 while ambulating. Left in bed w/call bell in reach.   [name] Mobility Specialist

## 2022-01-04 NOTE — Progress Notes (Signed)
Patient is on 2 liters of oxygen via nasal cannula not 4 liters.

## 2022-01-04 NOTE — Plan of Care (Signed)
  Problem: Education: Goal: Knowledge of General Education information will improve Description: Including pain rating scale, medication(s)/side effects and non-pharmacologic comfort measures Outcome: Progressing   Problem: Health Behavior/Discharge Planning: Goal: Ability to manage health-related needs will improve Outcome: Progressing   Problem: Clinical Measurements: Goal: Ability to maintain clinical measurements within normal limits will improve Outcome: Progressing   Problem: Clinical Measurements: Goal: Diagnostic test results will improve Outcome: Progressing   Problem: Clinical Measurements: Goal: Will remain free from infection Outcome: Progressing   Problem: Clinical Measurements: Goal: Respiratory complications will improve Outcome: Progressing   Problem: Clinical Measurements: Goal: Cardiovascular complication will be avoided Outcome: Progressing   Problem: Pain Managment: Goal: General experience of comfort will improve Outcome: Progressing   Problem: Safety: Goal: Ability to remain free from injury will improve Outcome: Progressing

## 2022-01-05 DIAGNOSIS — I10 Essential (primary) hypertension: Secondary | ICD-10-CM

## 2022-01-05 DIAGNOSIS — D509 Iron deficiency anemia, unspecified: Secondary | ICD-10-CM

## 2022-01-05 DIAGNOSIS — D62 Acute posthemorrhagic anemia: Secondary | ICD-10-CM | POA: Diagnosis not present

## 2022-01-05 DIAGNOSIS — E538 Deficiency of other specified B group vitamins: Secondary | ICD-10-CM

## 2022-01-05 DIAGNOSIS — I483 Typical atrial flutter: Secondary | ICD-10-CM | POA: Diagnosis not present

## 2022-01-05 DIAGNOSIS — D649 Anemia, unspecified: Secondary | ICD-10-CM | POA: Diagnosis not present

## 2022-01-05 DIAGNOSIS — K922 Gastrointestinal hemorrhage, unspecified: Secondary | ICD-10-CM | POA: Diagnosis not present

## 2022-01-05 LAB — GLUCOSE, CAPILLARY
Glucose-Capillary: 88 mg/dL (ref 70–99)
Glucose-Capillary: 83 mg/dL (ref 70–99)
Glucose-Capillary: 77 mg/dL (ref 70–99)

## 2022-01-05 LAB — CBC
HCT: 26.2 % — ABNORMAL LOW (ref 36.0–46.0)
Hemoglobin: 7.9 g/dL — ABNORMAL LOW (ref 12.0–15.0)
MCH: 25.6 pg — ABNORMAL LOW (ref 26.0–34.0)
MCHC: 30.2 g/dL (ref 30.0–36.0)
MCV: 84.8 fL (ref 80.0–100.0)
Platelets: 220 10*3/uL (ref 150–400)
RBC: 3.09 MIL/uL — ABNORMAL LOW (ref 3.87–5.11)
RDW: 17.4 % — ABNORMAL HIGH (ref 11.5–15.5)
WBC: 6.6 10*3/uL (ref 4.0–10.5)
nRBC: 0 % (ref 0.0–0.2)

## 2022-01-05 LAB — HEPARIN LEVEL (UNFRACTIONATED): Heparin Unfractionated: 0.3 IU/mL (ref 0.30–0.70)

## 2022-01-05 LAB — PROTIME-INR
INR: 2 — ABNORMAL HIGH (ref 0.8–1.2)
Prothrombin Time: 22.1 seconds — ABNORMAL HIGH (ref 11.4–15.2)

## 2022-01-05 MED ORDER — HEPARIN (PORCINE) 25000 UT/250ML-% IV SOLN
1200.0000 [IU]/h | INTRAVENOUS | Status: AC
  Administered 2022-01-05 – 2022-01-06 (×2): 1200 [IU]/h via INTRAVENOUS

## 2022-01-05 NOTE — Progress Notes (Signed)
PROGRESS NOTE  [name] UDJ:[id number] DOB: [birth year]   PCP: Sue Lush, PA-C  Patient is from: Home.  Lives with sister.  Independently ambulates at baseline.  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  Hgb 6.2 (from 9.73 months ago).  Hemoccult positive.  Hemodynamically stable.  EKG showed rate controlled atrial flutter.  On further questioning, she reports melena but no hematochezia.  Denies NSAID use.   Patient was transfused 2 units with appropriate response.  H&H stable since then.  EGD postponed to 8/9 due INR.  Subjective: Seen and examined earlier this morning.  No major events overnight of this morning.  Feels well.  No complaints.  Objective: Vitals:   01/04/22 2321 01/05/22 0351 01/05/22 0745 01/05/22 0746  BP: 116/66 139/76 122/84   Pulse: 75 60 68   Resp: '19 18 19   '$ Temp: 98.1 F (36.7 C) 97.7 F (36.5 C) 97.8 F (36.6 C)   TempSrc: Oral Oral Oral   SpO2: 96% 91% 92% 94%  Weight:  110.7 kg    Height:        Examination:  GENERAL: No apparent distress.  Nontoxic. HEENT: MMM.  Vision and hearing grossly intact.  NECK: Supple.  No apparent JVD.  RESP:  No IWOB.  Fair aeration bilaterally. CVS:  RRR. Heart sounds normal.  ABD/GI/GU: BS+. Abd soft, NTND.  MSK/EXT:  Moves extremities. No apparent deformity. No edema.  SKIN: no apparent skin lesion or wound NEURO: Awake and alert. Oriented appropriately.  No apparent focal neuro deficit. PSYCH: Calm. Normal affect.   Procedures:  None  Microbiology summarized: None  Assessment and  plan: Principal Problem:   Acute GI bleeding Active Problems:   Essential hypertension   ILD (interstitial lung disease) (HCC)   Atrial flutter (HCC)   Symptomatic anemia   Stage 3 chronic kidney disease (HCC)   Chronic respiratory failure (HCC)   Hx of mechanical aortic valve replacement   Morbid obesity (HCC)   PAF (paroxysmal atrial fibrillation) (HCC)   Chronic anticoagulation   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HCC)   Chronic diastolic CHF (congestive heart failure) (HCC)   Acute on chronic blood loss anemia   Iron deficiency anemia   Vitamin B12 deficiency  Acute on chronic symptomatic blood loss anemia due to GI bleed-patient reports melena suggesting upper GI bleed.  She is on warfarin for mechanical heart valve and paroxysmal A-fib.  Hemoccult positive.  She also has history of hemolytic anemia.  LDH elevated to 280 but about baseline.  INR therapeutic.  Anemia panel with iron deficiency and B12 deficiency.  Transfused 2 units with appropriate response.  H&H stable. Recent Labs    05/15/21 0201 05/16/21 0220 05/19/21 0552 07/21/21 0000 09/01/21 1408 11/16/21 1437 01/02/22 2100 01/03/22 0922 01/04/22 0413 01/05/22 0530  HGB 10.4* 9.1* 9.2* 9.9* 10.0* 9.7* 6.2* 8.0* 8.0* 7.9*  -Monitor H&H, check anemia panel and follow haptoglobin -Continue holding warfarin -Continue IV Protonix 40 mg twice daily -Vitamin B12 injection daily x 2 followed by p.o. -IV ferric gluconate 250 mg daily x 2 -EGD postponed to 8/9 until INR is  less than 1.8 -Soft diet and n.p.o. after midnight  History of mechanical aortic and mitral valve: per [facility]oth[facility] not a candidate for redo surgery.  On warfarin at home.  INR 2.0. -Continue holding warfarin -IV heparin for anticoagulation  Paroxysmal A-fib/a flutter: EKG suggests rate controlled a flutter. -Continue home metoprolol -Anticoagulation as above  Chronic diastolic CHF:  Appears euvolemic on exam.  On Lasix 80 mg daily.  TTE in  04/2021 with LVEF of 55 to 60%, indeterminate DD.  Creatinine slightly up. -Continue holding Lasix. -Monitor intake and output, renal functions and electrolytes  Interstitial lung disease/chronic hypoxic RF: On 4 L at baseline.  Saturating in upper 90s on 2 L by [place]. -Continue home inhalers  CKD-3A: Relatively stable. Recent Labs    05/13/21 0518 05/14/21 0446 05/16/21 0220 05/17/21 0400 05/18/21 0122 05/19/21 0552 06/23/21 1354 09/01/21 1408 12/04/21 1453 01/02/22 2100 01/04/22 0413  BUN '10 10 16 14 18  '$ --  21 26 32* 35* 32*  CREATININE 0.86 0.98 1.04* 1.02* 1.27* 1.09* 1.19* 1.25* 1.41* 1.30* 1.40*  -Continue holding Lasix. -Continue monitoring  OSA: Does not seem to be on CPAP at home. -Continue home oxygen  Anxiety and depression: Stable. -Continue home Prozac  History of gastric bypass: -Check anemia panel  Morbid obesity Body mass index is 39.38 kg/m.           DVT prophylaxis:  On full dose anticoagulation.  Code Status: Full code Family Communication: None at bedside Level of care: Telemetry Medical Status is: Inpatient Remains inpatient appropriate because: Acute on chronic symptomatic blood loss anemia due to GI bleed inpatient on chronic anticoagulation   Final disposition: Likely home once medically cleared Consultants:  Gastroenterology  Sch Meds:  Scheduled Meds:  allopurinol  150 mg Oral QHS   [START ON 01/06/2022] vitamin B-12  1,000 mcg Oral Daily   FLUoxetine  20 mg Oral Daily   metoprolol tartrate  50 mg Oral Daily   montelukast  10 mg Oral Daily   pantoprazole (PROTONIX) IV  40 mg Intravenous Q12H   pravastatin  80 mg Oral Daily   umeclidinium-vilanterol  1 puff Inhalation Daily   Continuous Infusions:  sodium chloride 20 mL/hr at 01/04/22 0657   heparin 1,200 Units/hr (01/05/22 0800)   PRN Meds:.acetaminophen **OR** acetaminophen  Antimicrobials: Anti-infectives (From admission, onward)    None        I have  personally reviewed the following labs and images: CBC: Recent Labs  Lab 01/02/22 2100 01/03/22 0922 01/04/22 0413 01/05/22 0530  WBC 9.3  --  7.0 6.6  NEUTROABS 7.2  --   --   --   HGB 6.2* 8.0* 8.0* 7.9*  HCT 21.7* 26.3* 26.5* 26.2*  MCV 86.5  --  85.8 84.8  PLT 219  --  240 220   BMP &GFR Recent Labs  Lab 01/02/22 2100 01/04/22 0413  NA 139 140  K 4.5 3.9  CL 107 109  CO2 24 26  GLUCOSE 89 93  BUN 35* 32*  CREATININE 1.30* 1.40*  CALCIUM 9.4 9.6  MG 2.4 2.1  PHOS  --  4.4   Estimated Creatinine Clearance: 52.6 mL/min (A) (by C-G formula based on SCr of 1.4 mg/dL (H)). Liver & Pancreas: Recent Labs  Lab 01/02/22 2100 01/04/22 0413  AST 19  --   ALT 8  --   ALKPHOS 61  --   BILITOT 1.1  --   PROT 6.7  --   ALBUMIN  3.7 3.8   No results for input(s): "LIPASE", "AMYLASE" in the last 168 hours. No results for input(s): "AMMONIA" in the last 168 hours. Diabetic: No results for input(s): "HGBA1C" in the last 72 hours. Recent Labs  Lab 01/04/22 0903 01/04/22 1121 01/04/22 2318 01/05/22 0603 01/05/22 0758  GLUCAP 90 86 119* 88 83   Cardiac Enzymes: No results for input(s): "CKTOTAL", "CKMB", "CKMBINDEX", "TROPONINI" in the last 168 hours. Recent Labs    09/01/21 1408  PROBNP 2,651*   Coagulation Profile: Recent Labs  Lab 01/02/22 2100 01/03/22 0922 01/04/22 0413 01/05/22 0530  INR 2.9* 2.8* 2.3* 2.0*   Thyroid Function Tests: Recent Labs    01/02/22 2100  TSH 2.073   Lipid Profile: No results for input(s): "CHOL", "HDL", "LDLCALC", "TRIG", "CHOLHDL", "LDLDIRECT" in the last 72 hours. Anemia Panel: Recent Labs    01/04/22 0413  VITAMINB12 163*  FOLATE 20.8  FERRITIN 11  TIBC 452*  IRON 24*  RETICCTPCT 3.4*   Urine analysis:    Component Value Date/Time   COLORURINE YELLOW 02/26/2021 1620   APPEARANCEUR CLEAR 02/26/2021 1620   LABSPEC 1.010 02/26/2021 1620   PHURINE 5.0 02/26/2021 1620   GLUCOSEU NEGATIVE 02/26/2021 1620    HGBUR SMALL (A) 02/26/2021 1620   BILIRUBINUR NEGATIVE 02/26/2021 1620   KETONESUR NEGATIVE 02/26/2021 1620   PROTEINUR 30 (A) 02/26/2021 1620   UROBILINOGEN 0.2 04/08/2015 0317   NITRITE NEGATIVE 02/26/2021 1620   LEUKOCYTESUR NEGATIVE 02/26/2021 1620   Sepsis Labs: Invalid input(s): "PROCALCITONIN", "LACTICIDVEN"  Microbiology: Recent Results (from the past 240 hour(s))  Resp Panel by RT-PCR (Flu A&B, Covid) Anterior Nasal Swab     Status: None   Collection Time: 01/02/22 10:00 PM   Specimen: Anterior Nasal Swab  Result Value Ref Range Status   SARS Coronavirus 2 by RT PCR NEGATIVE NEGATIVE Final    Comment: (NOTE) SARS-CoV-2 target nucleic acids are NOT DETECTED.  The SARS-CoV-2 RNA is generally detectable in upper respiratory specimens during the acute phase of infection. The lowest concentration of SARS-CoV-2 viral copies this assay can detect is 138 copies/mL. A negative result does not preclude SARS-Cov-2 infection and should not be used as the sole basis for treatment or other patient management decisions. A negative result may occur with  improper specimen collection/handling, submission of specimen other than nasopharyngeal swab, presence of viral mutation(s) within the areas targeted by this assay, and inadequate number of viral copies(<138 copies/mL). A negative result must be combined with clinical observations, patient history, and epidemiological information. The expected result is Negative.  Fact Sheet for Patients:  EntrepreneurPulse.com.au  Fact Sheet for Healthcare Providers:  IncredibleEmployment.be  This test is no t yet approved or cleared by the [place] FDA and  has been authorized for detection and/or diagnosis of SARS-CoV-2 by FDA under an Emergency Use Authorization (EUA). This EUA will remain  in effect (meaning this test can be used) for the duration of the COVID-19 declaration under Section 564(b)(1) of  the Act, 21 U.S.C.section 360bbb-3(b)(1), unless the authorization is terminated  or revoked sooner.       Influenza A by PCR NEGATIVE NEGATIVE Final   Influenza B by PCR NEGATIVE NEGATIVE Final    Comment: (NOTE) The Xpert Xpress SARS-CoV-2/FLU/RSV plus assay is intended as an aid in the diagnosis of influenza from Nasopharyngeal swab specimens and should not be used as a sole basis for treatment. Nasal washings and aspirates are unacceptable for Xpert Xpress SARS-CoV-2/FLU/RSV testing.  Fact Sheet for Patients: EntrepreneurPulse.com.au  Fact Sheet for Healthcare Providers: IncredibleEmployment.be  This test is not yet approved or cleared by the [place] FDA and has been authorized for detection and/or diagnosis of SARS-CoV-2 by FDA under an Emergency Use Authorization (EUA). This EUA will remain in effect (meaning this test can be used) for the duration of the COVID-19 declaration under Section 564(b)(1) of the Act, 21 U.S.C. section 360bbb-3(b)(1), unless the authorization is terminated or revoked.  Performed at [facility], [place] [address] [place], [place] [address]     Radiology Studies: No results found.    [name]  If 7PM-7AM, please contact night-coverage [email or web address] 01/05/2022, 2:32 PM

## 2022-01-05 NOTE — Progress Notes (Signed)
   01/05/22 1100  Mobility  Activity Ambulated with assistance in hallway;Ambulated independently to bathroom  Level of Therapist, art wheel walker  Distance Ambulated (ft) 250 ft (X1 standing break at 125 ft)  Activity Response Tolerated well  $Mobility charge 1 Mobility   Mobility Specialist Progress Note  Pt was in bed & agreeable. Experienced some shortness of breath halfway. Left in bed w/call bell in reach.  Lucious Groves Mobility Specialist

## 2022-01-05 NOTE — Progress Notes (Signed)
INR 2.0.  Still too high for endoscopy; will cancel.  Imagine, at current rate, she'll have INR in acceptable range for endoscopy tomorrow.
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
## 2022-01-05 NOTE — Progress Notes (Signed)
Pt admitted with GI Bleed. History of mechanical aortic and mitral valve, Paroxysmal A-fib/a flutter, Chronic diastolic CHF, Interstitial lung disease/chronic hypoxic RF, CKD-3A. TOC will follow for potential DC needs.

## 2022-01-06 ENCOUNTER — Encounter (HOSPITAL_COMMUNITY): Payer: Self-pay | Admitting: Internal Medicine

## 2022-01-06 ENCOUNTER — Inpatient Hospital Stay (HOSPITAL_COMMUNITY): Payer: Medicare Other | Admitting: Certified Registered Nurse Anesthetist

## 2022-01-06 ENCOUNTER — Encounter (HOSPITAL_COMMUNITY)
Admission: EM | Disposition: A | Discharge status: Home / Self Care | Payer: Self-pay | Source: Home / Self Care | Attending: Internal Medicine

## 2022-01-06 DIAGNOSIS — D500 Iron deficiency anemia secondary to blood loss (chronic): Secondary | ICD-10-CM

## 2022-01-06 DIAGNOSIS — K5730 Diverticulosis of large intestine without perforation or abscess without bleeding: Secondary | ICD-10-CM

## 2022-01-06 DIAGNOSIS — J45909 Unspecified asthma, uncomplicated: Secondary | ICD-10-CM

## 2022-01-06 DIAGNOSIS — K922 Gastrointestinal hemorrhage, unspecified: Secondary | ICD-10-CM | POA: Diagnosis not present

## 2022-01-06 DIAGNOSIS — K648 Other hemorrhoids: Secondary | ICD-10-CM

## 2022-01-06 DIAGNOSIS — Z87891 Personal history of nicotine dependence: Secondary | ICD-10-CM

## 2022-01-06 HISTORY — PX: ESOPHAGOGASTRODUODENOSCOPY (EGD) WITH PROPOFOL: SHX5813

## 2022-01-06 LAB — CBC
HCT: 27.1 % — ABNORMAL LOW (ref 36.0–46.0)
Hemoglobin: 8 g/dL — ABNORMAL LOW (ref 12.0–15.0)
MCH: 25.6 pg — ABNORMAL LOW (ref 26.0–34.0)
MCHC: 29.5 g/dL — ABNORMAL LOW (ref 30.0–36.0)
MCV: 86.6 fL (ref 80.0–100.0)
Platelets: 241 10*3/uL (ref 150–400)
RBC: 3.13 MIL/uL — ABNORMAL LOW (ref 3.87–5.11)
RDW: 17.9 % — ABNORMAL HIGH (ref 11.5–15.5)
WBC: 7.3 10*3/uL (ref 4.0–10.5)
nRBC: 0.4 % — ABNORMAL HIGH (ref 0.0–0.2)

## 2022-01-06 LAB — RENAL FUNCTION PANEL
Albumin: 3.8 g/dL (ref 3.5–5.0)
Anion gap: 7 (ref 5–15)
BUN: 28 mg/dL — ABNORMAL HIGH (ref 8–23)
CO2: 24 mmol/L (ref 22–32)
Calcium: 9.6 mg/dL (ref 8.9–10.3)
Chloride: 111 mmol/L (ref 98–111)
Creatinine, Ser: 1.28 mg/dL — ABNORMAL HIGH (ref 0.44–1.00)
GFR, Estimated: 47 mL/min — ABNORMAL LOW (ref >60)
Glucose, Bld: 79 mg/dL (ref 70–99)
Phosphorus: 3.9 mg/dL (ref 2.5–4.6)
Potassium: 3.8 mmol/L (ref 3.5–5.1)
Sodium: 142 mmol/L (ref 135–145)

## 2022-01-06 LAB — MAGNESIUM: Magnesium: 2.2 mg/dL (ref 1.7–2.4)

## 2022-01-06 LAB — VITAMIN B12: Vitamin B-12: >7500 pg/mL — ABNORMAL HIGH (ref 180–914)

## 2022-01-06 LAB — GLUCOSE, CAPILLARY
Glucose-Capillary: 86 mg/dL (ref 70–99)
Glucose-Capillary: 89 mg/dL (ref 70–99)
Glucose-Capillary: 90 mg/dL (ref 70–99)

## 2022-01-06 LAB — RETICULOCYTES
Immature Retic Fract: 34.7 % — ABNORMAL HIGH (ref 2.3–15.9)
RBC.: 3.1 MIL/uL — ABNORMAL LOW (ref 3.87–5.11)
Retic Count, Absolute: 129.3 10*3/uL (ref 19.0–186.0)
Retic Ct Pct: 4.2 % — ABNORMAL HIGH (ref 0.4–3.1)

## 2022-01-06 LAB — IRON AND TIBC
Iron: 287 ug/dL — ABNORMAL HIGH (ref 28–170)
Saturation Ratios: 70 % — ABNORMAL HIGH (ref 10.4–31.8)
TIBC: 407 ug/dL (ref 250–450)
UIBC: 120 ug/dL

## 2022-01-06 LAB — PROTIME-INR
INR: 1.7 — ABNORMAL HIGH (ref 0.8–1.2)
Prothrombin Time: 19.7 seconds — ABNORMAL HIGH (ref 11.4–15.2)

## 2022-01-06 LAB — FERRITIN: Ferritin: 111 ng/mL (ref 11–307)

## 2022-01-06 LAB — HEPARIN LEVEL (UNFRACTIONATED): Heparin Unfractionated: 0.25 IU/mL — ABNORMAL LOW (ref 0.30–0.70)

## 2022-01-06 LAB — FOLATE: Folate: 20.1 ng/mL (ref >5.9)

## 2022-01-06 SURGERY — ESOPHAGOGASTRODUODENOSCOPY (EGD) WITH PROPOFOL
Anesthesia: Monitor Anesthesia Care

## 2022-01-06 MED ORDER — PEG-KCL-NACL-NASULF-NA ASC-C 100 G PO SOLR
0.5000 | Freq: Once | ORAL | Status: AC
  Administered 2022-01-07: 100 g via ORAL
  Filled 2022-01-06: qty 1

## 2022-01-06 MED ORDER — LACTATED RINGERS IV SOLN: INTRAVENOUS | Status: DC | PRN

## 2022-01-06 MED ORDER — EPHEDRINE SULFATE-NACL 50-0.9 MG/10ML-% IV SOSY
PREFILLED_SYRINGE | INTRAVENOUS | Status: DC | PRN
  Administered 2022-01-06: 15 mg via INTRAVENOUS

## 2022-01-06 MED ORDER — PROPOFOL 500 MG/50ML IV EMUL
INTRAVENOUS | Status: DC | PRN
  Administered 2022-01-06: 100 ug/kg/min via INTRAVENOUS

## 2022-01-06 MED ORDER — ONDANSETRON HCL 4 MG/2ML IJ SOLN
INTRAMUSCULAR | Status: DC | PRN
  Administered 2022-01-06: 4 mg via INTRAVENOUS

## 2022-01-06 MED ORDER — BISACODYL 10 MG RE SUPP
10.0000 mg | Freq: Once | RECTAL | Status: AC
  Administered 2022-01-06: 10 mg via RECTAL
  Filled 2022-01-06: qty 1

## 2022-01-06 MED ORDER — ORAL CARE MOUTH RINSE: 15.0000 mL | OROMUCOSAL | Status: DC | PRN

## 2022-01-06 MED ORDER — HEPARIN (PORCINE) 25000 UT/250ML-% IV SOLN
1400.0000 [IU]/h | INTRAVENOUS | Status: AC
Start: 2022-01-06 — End: 2022-01-07
  Administered 2022-01-06: 1300 [IU]/h via INTRAVENOUS

## 2022-01-06 MED ORDER — PEG-KCL-NACL-NASULF-NA ASC-C 100 G PO SOLR
0.5000 | Freq: Once | ORAL | Status: AC
  Administered 2022-01-06: 100 g via ORAL
  Filled 2022-01-06: qty 1

## 2022-01-06 MED ORDER — PEG-KCL-NACL-NASULF-NA ASC-C 100 G PO SOLR
1.0000 | Freq: Once | ORAL | Status: DC
Start: 2022-01-06 — End: 2022-01-06

## 2022-01-06 SURGICAL SUPPLY — 15 items

## 2022-01-06 NOTE — Interval H&P Note (Signed)
History and Physical Interval Note:  01/06/2022 12:24 PM  [name]  has presented today for surgery, with the diagnosis of Anemia, melena.  The various methods of treatment have been discussed with the patient and family. After consideration of risks, benefits and other options for treatment, the patient has consented to  Procedure(s): ESOPHAGOGASTRODUODENOSCOPY (EGD) WITH PROPOFOL (N/A) as a surgical intervention.  The patient's history has been reviewed, patient examined, no change in status, stable for surgery.  I have reviewed the patient's chart and labs.  Questions were answered to the patient's satisfaction.     Landry Dyke

## 2022-01-06 NOTE — TOC Progression Note (Signed)
Transition of Care [facility]) - Progression Note    Patient Details  Name: [name] MRN: [id number] Date of Birth: [birth year]  Transition of Care [facility]) CM/SW Contact  Zenon Mayo, RN Phone Number: 01/06/2022, 3:23 PM  Clinical Narrative:    Here with Anemia, GIB , indep, uses oxygen at home ,4 liters, hgb is 8 today, INR is 1.7 for EGD today. TOC following.        Expected Discharge Plan and Services                                                 Social Determinants of Health (SDOH) Interventions    Readmission Risk Interventions    05/13/2021    2:19 PM  Readmission Risk Prevention Plan  Transportation Screening Complete  PCP or Specialist Appt within 3-5 Days Complete  HRI or [place] Complete  Social Work Consult for [place] Planning/Counseling Complete  Palliative Care Screening Not Applicable  Medication Review [employer]) Complete

## 2022-01-06 NOTE — Anesthesia Preprocedure Evaluation (Signed)
Anesthesia Evaluation  Patient identified by MRN, date of birth, ID band Patient awake    Reviewed: Allergy & Precautions, NPO status , Patient's Chart, lab work & pertinent test results  Airway Mallampati: III  TM Distance: >3 FB Neck ROM: Full    Dental no notable dental hx. (+) Edentulous Upper, Missing, Dental Advisory Given, Poor Dentition,    Pulmonary asthma , former smoker,    Pulmonary exam normal breath sounds clear to auscultation       Cardiovascular hypertension, +CHF  Normal cardiovascular exam+ Valvular Problems/Murmurs  Rhythm:Regular Rate:Normal     Neuro/Psych    GI/Hepatic   Endo/Other    Renal/GU Lab Results      Component                Value               Date                      CREATININE               1.28 (H)            01/06/2022             K                        3.8                 01/06/2022                     Musculoskeletal  (+) Arthritis ,   Abdominal (+) + obese (39.6),   Peds  Hematology  (+) Blood dyscrasia, anemia , Lab Results      Component                Value               Date                      WBC                      7.3                 01/06/2022                HGB                      8.0 (L)             01/06/2022                HCT                      27.1 (L)            01/06/2022                MCV                      86.6                01/06/2022                PLT                      241  01/06/2022              Anesthesia Other Findings   Reproductive/Obstetrics                            Anesthesia Physical Anesthesia Plan  ASA: 4  Anesthesia Plan: MAC   Post-op Pain Management: Minimal or no pain anticipated   Induction:   PONV Risk Score and Plan: Treatment may vary due to age or medical condition, TIVA and Propofol infusion  Airway Management Planned: Natural Airway and Nasal  Cannula  Additional Equipment: None  Intra-op Plan:   Post-operative Plan:   Informed Consent: I have reviewed the patients History and Physical, chart, labs and discussed the procedure including the risks, benefits and alternatives for the proposed anesthesia with the patient or authorized representative who has indicated his/her understanding and acceptance.     Dental advisory given  Plan Discussed with:   Anesthesia Plan Comments: (Colonoscopy for Anemia and Heme pos Stool)       Anesthesia Quick Evaluation

## 2022-01-06 NOTE — Progress Notes (Signed)
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  Hgb 6.2 (from 9.73 months ago).  Hemoccult positive.  Hemodynamically stable.  EKG showed rate controlled atrial flutter.  On further questioning, she reports melena but no hematochezia.  Denies NSAID use.  01/06/2022 INR 1.8.  She was transfused 2 units of packed RBCs with improvement in her hemoglobin.  EGD 8/9 with no active evidence of acute bleeding in the upper GI.  Plan for colonoscopy in the morning    Assessment & Plan:   Principal Problem:   Acute GI bleeding Active Problems:   Essential hypertension   ILD (interstitial lung disease) (HCC)   Atrial flutter (HCC)   Symptomatic anemia   Stage 3 chronic kidney disease (HCC)   Chronic respiratory failure (HCC)   Hx of mechanical aortic valve replacement   Morbid obesity (HCC)   PAF (paroxysmal atrial fibrillation) (HCC)   Chronic anticoagulation   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HCC)   Chronic diastolic CHF (congestive heart failure) (HCC)   Acute on chronic blood loss anemia   Iron deficiency anemia   Vitamin B12 deficiency  #1 acute on chronic blood loss anemia due to GI bleed.  Patient is on Eliquis for mechanical aortic and mitral valve.  Patient scheduled to have upper endoscopy done today.   Continue heparin bridging Patient received overall 2 units of packed RBCs and 2 units of iron ferrous gluconate. Hemoglobin today 8.4  Continue IV Protonix  Plan for colonoscopy in AM.  #2 history of mechanical aortic and mitral valve on Coumadin currently on heparin  3.  Paroxysmal atrial fibrillation  continue metoprolol anticoagulation as above  #4 history of interstitial lung disease on 4 L of oxygen 24/7.  #5 history of CKD stage III AA monitor stable currently  #6 anxiety and depression on Prozac  #7 iron deficiency anemia with history of gastric bypass status post iron infusion     Estimated body mass index is 39.56 kg/m as calculated from the following:   Height as of this encounter: 5' 6"  (1.676 m).   Weight as of this encounter: 111.2 kg.  DVT prophylaxis: Heparin Code Status: Full code Family Communication: None at bedside Disposition Plan:  Status is: Inpatient   Consultants: GI  Procedures: EGD on 01/06/2022   antimicrobials none  Subjective:  She is sitting up at the side of the bed she has no specific complaints she is anxious to have the procedure done she denies shortness of breath or chest pain Objective: Vitals:   01/06/22 1138 01/06/22 1307 01/06/22 1315 01/06/22 1330  BP: (!) 146/92 (!) 93/33 (!) 95/54 (!) 113/95  Pulse: (!) 58 62 64 71  Resp: (!) 22 18 13 19   Temp: 98.5 F (36.9 C)   (P) 97.7 F (36.5 C)  TempSrc: Temporal     SpO2: 96% 92% 94% 90%  Weight:      Height:        Intake/Output Summary (Last 24 hours) at 01/06/2022 1427 Last data filed at 01/06/2022 1301 Gross per 24 hour  Intake 760 ml  Output 500 ml  Net 260 ml   Filed  Weights   01/04/22 0502 01/05/22 0351 01/06/22 0422  Weight: 110.2 kg 110.7 kg 111.2 kg    Examination:  General exam: Appears calm and comfortable  Respiratory system: Clear to auscultation. Respiratory effort normal. Cardiovascular system: S1 & S2 heard, RRR. No JVD, murmurs, rubs, gallops or clicks. No pedal edema. Gastrointestinal system: Abdomen is nondistended, soft and nontender. No organomegaly or masses felt. Normal bowel sounds heard. Central nervous system: Alert and oriented. No focal neurological deficits. Extremities: Symmetric 5 x 5 power. Skin: No rashes, lesions or ulcers Psychiatry:  Judgement and insight appear normal. Mood & affect appropriate.     Data Reviewed: I have personally reviewed following labs and imaging studies  CBC: Recent Labs  Lab 01/02/22 2100 01/03/22 0922 01/04/22 0413 01/05/22 0530 01/06/22 0413  WBC 9.3  --  7.0 6.6 7.3  NEUTROABS 7.2  --   --   --   --   HGB 6.2* 8.0* 8.0* 7.9* 8.0*  HCT 21.7* 26.3* 26.5* 26.2* 27.1*  MCV 86.5  --  85.8 84.8 86.6  PLT 219  --  240 220 360   Basic Metabolic Panel: Recent Labs  Lab 01/02/22 2100 01/04/22 0413 01/06/22 0413  NA 139 140 142  K 4.5 3.9 3.8  CL 107 109 111  CO2 24 26 24   GLUCOSE 89 93 79  BUN 35* 32* 28*  CREATININE 1.30* 1.40* 1.28*  CALCIUM 9.4 9.6 9.6  MG 2.4 2.1 2.2  PHOS  --  4.4 3.9   GFR: Estimated Creatinine Clearance: 57.6 mL/min (A) (by C-G formula based on SCr of 1.28 mg/dL (H)). Liver Function Tests: Recent Labs  Lab 01/02/22 2100 01/04/22 0413 01/06/22 0413  AST 19  --   --   ALT 8  --   --   ALKPHOS 61  --   --   BILITOT 1.1  --   --   PROT 6.7  --   --   ALBUMIN 3.7 3.8 3.8   No results for input(s): "LIPASE", "AMYLASE" in the last 168 hours. No results for input(s): "AMMONIA" in the last 168 hours. Coagulation Profile: Recent Labs  Lab 01/02/22 2100 01/03/22 0922 01/04/22 0413 01/05/22 0530 01/06/22 0413  INR 2.9* 2.8* 2.3* 2.0* 1.7*   Cardiac Enzymes: No results for input(s): "CKTOTAL", "CKMB", "CKMBINDEX", "TROPONINI" in the last 168 hours. BNP (last 3 results) Recent Labs    09/01/21 1408  PROBNP 2,651*   HbA1C: No results for input(s): "HGBA1C" in the last 72 hours. CBG: Recent Labs  Lab 01/05/22 0603 01/05/22 0758 01/05/22 1606 01/06/22 0008 01/06/22 0608  GLUCAP 88 83 77 86 90   Lipid Profile: No results for input(s): "CHOL", "HDL", "LDLCALC", "TRIG", "CHOLHDL", "LDLDIRECT" in the last 72 hours. Thyroid Function Tests: No results for input(s): "TSH", "T4TOTAL", "FREET4", "T3FREE", "THYROIDAB" in the last 72  hours. Anemia Panel: Recent Labs    01/04/22 0413 01/06/22 0413  VITAMINB12 163* >7,500*  FOLATE 20.8 20.1  FERRITIN 11 111  TIBC 452* 407  IRON 24* 287*  RETICCTPCT 3.4* 4.2*   Sepsis Labs: No results for input(s): "PROCALCITON", "LATICACIDVEN" in the last 168 hours.  Recent Results (from the past 240 hour(s))  Resp Panel by RT-PCR (Flu A&B, Covid) Anterior Nasal Swab     Status: None   Collection Time: 01/02/22 10:00 PM   Specimen: Anterior Nasal Swab  Result Value Ref Range Status   SARS Coronavirus 2 by RT PCR NEGATIVE NEGATIVE Final    Comment: (NOTE) SARS-CoV-2  target nucleic acids are NOT DETECTED.  The SARS-CoV-2 RNA is generally detectable in upper respiratory specimens during the acute phase of infection. The lowest concentration of SARS-CoV-2 viral copies this assay can detect is 138 copies/mL. A negative result does not preclude SARS-Cov-2 infection and should not be used as the sole basis for treatment or other patient management decisions. A negative result may occur with  improper specimen collection/handling, submission of specimen other than nasopharyngeal swab, presence of viral mutation(s) within the areas targeted by this assay, and inadequate number of viral copies(<138 copies/mL). A negative result must be combined with clinical observations, patient history, and epidemiological information. The expected result is Negative.  Fact Sheet for Patients:  EntrepreneurPulse.com.au  Fact Sheet for Healthcare Providers:  IncredibleEmployment.be  This test is no t yet approved or cleared by the [place] FDA and  has been authorized for detection and/or diagnosis of SARS-CoV-2 by FDA under an Emergency Use Authorization (EUA). This EUA will remain  in effect (meaning this test can be used) for the duration of the COVID-19 declaration under Section 564(b)(1) of the Act, 21 U.S.C.section 360bbb-3(b)(1), unless the  authorization is terminated  or revoked sooner.       Influenza A by PCR NEGATIVE NEGATIVE Final   Influenza B by PCR NEGATIVE NEGATIVE Final    Comment: (NOTE) The Xpert Xpress SARS-CoV-2/FLU/RSV plus assay is intended as an aid in the diagnosis of influenza from Nasopharyngeal swab specimens and should not be used as a sole basis for treatment. Nasal washings and aspirates are unacceptable for Xpert Xpress SARS-CoV-2/FLU/RSV testing.  Fact Sheet for Patients: EntrepreneurPulse.com.au  Fact Sheet for Healthcare Providers: IncredibleEmployment.be  This test is not yet approved or cleared by the [place] FDA and has been authorized for detection and/or diagnosis of SARS-CoV-2 by FDA under an Emergency Use Authorization (EUA). This EUA will remain in effect (meaning this test can be used) for the duration of the COVID-19 declaration under Section 564(b)(1) of the Act, 21 U.S.C. section 360bbb-3(b)(1), unless the authorization is terminated or revoked.  Performed at [facility], [place] [address], [place], [place] [address]          Radiology Studies: No results found.      Scheduled Meds:  allopurinol  150 mg Oral QHS   bisacodyl  10 mg Rectal Once   vitamin B-12  1,000 mcg Oral Daily   FLUoxetine  20 mg Oral Daily   metoprolol tartrate  50 mg Oral Daily   montelukast  10 mg Oral Daily   pantoprazole (PROTONIX) IV  40 mg Intravenous Q12H   peg 3350 powder  0.5 kit Oral Once   And   [START ON 01/07/2022] peg 3350 powder  0.5 kit Oral Once   pravastatin  80 mg Oral Daily   umeclidinium-vilanterol  1 puff Inhalation Daily   Continuous Infusions:   LOS: 3 days    Time spent: 39 min  Georgette Shell, MD 01/06/2022, 2:27 PM

## 2022-01-06 NOTE — Transfer of Care (Signed)
Immediate Anesthesia Transfer of Care Note  Patient: [name]  Procedure(s) Performed: ESOPHAGOGASTRODUODENOSCOPY (EGD) WITH PROPOFOL  Patient Location: PACU  Anesthesia Type:MAC  Level of Consciousness: awake and alert   Airway & Oxygen Therapy: Patient Spontanous Breathing and Patient connected to nasal cannula oxygen  Post-op Assessment: Report given to RN and Post -op Vital signs reviewed and stable  Post vital signs: Reviewed and stable  Last Vitals:  Vitals Value Taken Time  BP 93/33 01/06/22 1307  Temp    Pulse 60 01/06/22 1314  Resp 17 01/06/22 1314  SpO2 84 % 01/06/22 1314  Vitals shown include unvalidated device data.  Last Pain:  Vitals:   01/06/22 1307  TempSrc:   PainSc: 0-No pain      Patients Stated Pain Goal: 1 (94/32/00 3794)  Complications: No notable events documented.

## 2022-01-06 NOTE — Progress Notes (Signed)
[place] for heparin (while warfarin on hold) Indication:  MVR/AVR/PAF  No Known Allergies  Patient Measurements: Height: '5\' 6"'$  (167.6 cm) Weight: 111.2 kg (245 lb 1.6 oz) IBW/kg (Calculated) : 59.3 Heparin Dosing Weight: 84 kg  Vital Signs: Temp: (P) 97.7 F (36.5 C) (08/09 1330) Temp Source: Temporal (08/09 1138) BP: 113/95 (08/09 1330) Pulse Rate: 71 (08/09 1330)  Labs: Recent Labs    01/04/22 0413 01/04/22 1207 01/04/22 2047 01/05/22 0530 01/06/22 0413  HGB 8.0*  --   --  7.9* 8.0*  HCT 26.5*  --   --  26.2* 27.1*  PLT 240  --   --  220 241  LABPROT 25.5*  --   --  22.1* 19.7*  INR 2.3*  --   --  2.0* 1.7*  HEPARINUNFRC  --    < > 0.35 0.30 0.25*  CREATININE 1.40*  --   --   --  1.28*   < > = values in this interval not displayed.     Estimated Creatinine Clearance: 57.6 mL/min (A) (by C-G formula based on SCr of 1.28 mg/dL (H)).   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  Heparin held 8/9 for endoscopy. Level obtained prior to gtt being held was slightly subtherapeutic (0.25) on infusion at 1200 units/hr. Hgb 8. Per MD post-op care orders: 1.  OK to resume heparin drip NOW, without bolus. 2.  Turn heparin drip back OFF tomorrow 01/07/2022 at 0800, in anticipation of noon colonoscopy tomorrow 01/07/2022  Goal of Therapy:  Heparin level 0.3-0.5 units/ml Monitor platelets by anticoagulation protocol: Yes   Plan:  Resume IV heparin at 1300 units/hr  Will f/u 8 hr heparin level Stop time added for 8/10 at 0800 in anticipation of colonoscopy 8/9 at noon.    Sherlon Handing, PharmD, BCPS Please see amion for complete clinical pharmacist phone list 01/06/2022 3:48 PM

## 2022-01-06 NOTE — Op Note (Signed)
[facility] Patient Name: [name] Procedure Date : 01/06/2022 MRN: [id number] Attending MD: Arta Silence , MD Date of Birth: [birth year] CSN: [id number] Age: 61 Admit Type: Inpatient Procedure:                Upper GI endoscopy Indications:              Iron deficiency anemia secondary to chronic blood                            loss, Heme positive stool Providers:                Arta Silence, MD, Doristine Johns, RN, Daryll Brod, RN, Hinton Dyer Technician, Technician,                            Gloris Ham, Technician Referring MD:             [facility] Medicines:                Monitored Anesthesia Care Complications:            No immediate complications. Estimated Blood Loss:     Estimated blood loss: none. Procedure:                Pre-Anesthesia Assessment:                           - Prior to the procedure, a History and Physical                            was performed, and patient medications and                            allergies were reviewed. The patient's tolerance of                            previous anesthesia was also reviewed. The risks                            and benefits of the procedure and the sedation                            options and risks were discussed with the patient.                            All questions were answered, and informed consent                            was obtained. Prior Anticoagulants: The patient has                            taken Coumadin (warfarin), last dose was 5 days  prior to procedure. ASA Grade Assessment: III - A                            patient with severe systemic disease. After                            reviewing the risks and benefits, the patient was                            deemed in satisfactory condition to undergo the                            procedure.                           After obtaining informed  consent, the endoscope was                            passed under direct vision. Throughout the                            procedure, the patient's blood pressure, pulse, and                            oxygen saturations were monitored continuously. The                            [id number]-[id number] ([id number] Olympus endoscope was introduced                            through the mouth, and advanced to the second part                            of duodenum. The upper GI endoscopy was                            accomplished without difficulty. The patient                            tolerated the procedure well. Scope In: Scope Out: Findings:      The examined esophagus was normal.      Patchy mild inflammation was found in the prepyloric region of the       stomach.      Sleeve gastrectomy changes.      The exam of the stomach was otherwise normal.      The duodenal bulb, first portion of the duodenum and second portion of       the duodenum were normal.      No old or fresh blood was seen to the extent of our examination. Impression:               - Normal esophagus.                           - Gastritis.                           -  Normal duodenal bulb, first portion of the                            duodenum and second portion of the duodenum.                           - No obvious source of anemia/occult blood seen. Recommendation:           - Return patient to hospital ward for ongoing care.                           - Clear liquid diet today.                           - Continue present medications.                           - Perform a colonoscopy tomorrow. Procedure Code(s):        --- Professional ---                           [phone], Esophagogastroduodenoscopy, flexible,                            transoral; diagnostic, including collection of                            specimen(s) by brushing or washing, when performed                            (separate procedure) Diagnosis  Code(s):        --- Professional ---                           K29.70, Gastritis, unspecified, without bleeding                           D50.0, Iron deficiency anemia secondary to blood                            loss (chronic)                           R19.5, Other fecal abnormalities CPT copyright 2019 American Medical Association. All rights reserved. The codes documented in this report are preliminary and upon coder review may  be revised to meet current compliance requirements. Arta Silence, MD 01/06/2022 1:12:06 PM This report has been signed electronically. Number of Addenda: 0

## 2022-01-06 NOTE — Anesthesia Preprocedure Evaluation (Signed)
Anesthesia Evaluation  Patient identified by MRN, date of birth, ID band Patient awake    Reviewed: Allergy & Precautions, NPO status , Patient's Chart, lab work & pertinent test results  Airway Mallampati: II  TM Distance: >3 FB     Dental   Pulmonary shortness of breath, asthma , sleep apnea , former smoker,    breath sounds clear to auscultation       Cardiovascular hypertension, +CHF  + dysrhythmias + Valvular Problems/Murmurs  Rhythm:Regular Rate:Normal     Neuro/Psych PSYCHIATRIC DISORDERS    GI/Hepatic negative GI ROS, Neg liver ROS,   Endo/Other  negative endocrine ROS  Renal/GU Renal disease     Musculoskeletal  (+) Arthritis ,   Abdominal   Peds  Hematology   Anesthesia Other Findings   Reproductive/Obstetrics                             Anesthesia Physical Anesthesia Plan  ASA: 3  Anesthesia Plan: MAC   Post-op Pain Management:    Induction:   PONV Risk Score and Plan: 2 and Ondansetron, Dexamethasone and Treatment may vary due to age or medical condition  Airway Management Planned: Nasal Cannula and Simple Face Mask  Additional Equipment:   Intra-op Plan:   Post-operative Plan:   Informed Consent: I have reviewed the patients History and Physical, chart, labs and discussed the procedure including the risks, benefits and alternatives for the proposed anesthesia with the patient or authorized representative who has indicated his/her understanding and acceptance.     Dental advisory given  Plan Discussed with: CRNA and Anesthesiologist  Anesthesia Plan Comments:         Anesthesia Quick Evaluation

## 2022-01-06 NOTE — Care Management Important Message (Signed)
Important Message  Patient Details  Name: [name] MRN: [id number] Date of Birth: 04-21-1961   Medicare Important Message Given:  Yes     Shelda Altes 01/06/2022, 10:22 AM

## 2022-01-07 ENCOUNTER — Encounter (HOSPITAL_COMMUNITY): Payer: Self-pay | Admitting: Internal Medicine

## 2022-01-07 ENCOUNTER — Encounter (HOSPITAL_COMMUNITY)
Admission: EM | Disposition: A | Discharge status: Home / Self Care | Payer: Self-pay | Source: Home / Self Care | Attending: Internal Medicine

## 2022-01-07 ENCOUNTER — Inpatient Hospital Stay (HOSPITAL_COMMUNITY): Payer: Medicare Other | Admitting: Anesthesiology

## 2022-01-07 DIAGNOSIS — D649 Anemia, unspecified: Secondary | ICD-10-CM | POA: Diagnosis not present

## 2022-01-07 DIAGNOSIS — Z87891 Personal history of nicotine dependence: Secondary | ICD-10-CM

## 2022-01-07 DIAGNOSIS — K922 Gastrointestinal hemorrhage, unspecified: Secondary | ICD-10-CM | POA: Diagnosis not present

## 2022-01-07 DIAGNOSIS — I509 Heart failure, unspecified: Secondary | ICD-10-CM | POA: Diagnosis not present

## 2022-01-07 DIAGNOSIS — K254 Chronic or unspecified gastric ulcer with hemorrhage: Secondary | ICD-10-CM | POA: Diagnosis not present

## 2022-01-07 DIAGNOSIS — I110 Hypertensive heart disease with heart failure: Secondary | ICD-10-CM | POA: Diagnosis not present

## 2022-01-07 HISTORY — PX: COLONOSCOPY WITH PROPOFOL: SHX5780

## 2022-01-07 LAB — COMPREHENSIVE METABOLIC PANEL
ALT: 10 U/L (ref 0–44)
AST: 18 U/L (ref 15–41)
Albumin: 3.7 g/dL (ref 3.5–5.0)
Alkaline Phosphatase: 62 U/L (ref 38–126)
Anion gap: 7 (ref 5–15)
BUN: 24 mg/dL — ABNORMAL HIGH (ref 8–23)
CO2: 22 mmol/L (ref 22–32)
Calcium: 9.6 mg/dL (ref 8.9–10.3)
Chloride: 112 mmol/L — ABNORMAL HIGH (ref 98–111)
Creatinine, Ser: 1.26 mg/dL — ABNORMAL HIGH (ref 0.44–1.00)
GFR, Estimated: 48 mL/min — ABNORMAL LOW (ref >60)
Glucose, Bld: 89 mg/dL (ref 70–99)
Potassium: 4.4 mmol/L (ref 3.5–5.1)
Sodium: 141 mmol/L (ref 135–145)
Total Bilirubin: 1.1 mg/dL (ref 0.3–1.2)
Total Protein: 6.7 g/dL (ref 6.5–8.1)

## 2022-01-07 LAB — HEPARIN LEVEL (UNFRACTIONATED)
Heparin Unfractionated: 0.19 IU/mL — ABNORMAL LOW (ref 0.30–0.70)
Heparin Unfractionated: 0.24 IU/mL — ABNORMAL LOW (ref 0.30–0.70)

## 2022-01-07 LAB — GLUCOSE, CAPILLARY
Glucose-Capillary: 85 mg/dL (ref 70–99)
Glucose-Capillary: 87 mg/dL (ref 70–99)

## 2022-01-07 LAB — CBC
HCT: 27.3 % — ABNORMAL LOW (ref 36.0–46.0)
Hemoglobin: 7.8 g/dL — ABNORMAL LOW (ref 12.0–15.0)
MCH: 25.4 pg — ABNORMAL LOW (ref 26.0–34.0)
MCHC: 28.6 g/dL — ABNORMAL LOW (ref 30.0–36.0)
MCV: 88.9 fL (ref 80.0–100.0)
Platelets: 233 10*3/uL (ref 150–400)
RBC: 3.07 MIL/uL — ABNORMAL LOW (ref 3.87–5.11)
RDW: 18 % — ABNORMAL HIGH (ref 11.5–15.5)
WBC: 7.2 10*3/uL (ref 4.0–10.5)
nRBC: 0.3 % — ABNORMAL HIGH (ref 0.0–0.2)

## 2022-01-07 LAB — PROTIME-INR
INR: 1.5 — ABNORMAL HIGH (ref 0.8–1.2)
Prothrombin Time: 17.7 seconds — ABNORMAL HIGH (ref 11.4–15.2)

## 2022-01-07 SURGERY — COLONOSCOPY WITH PROPOFOL
Anesthesia: Monitor Anesthesia Care

## 2022-01-07 MED ORDER — WARFARIN - PHARMACIST DOSING INPATIENT: Freq: Every day | Status: DC

## 2022-01-07 MED ORDER — PANTOPRAZOLE SODIUM 40 MG PO TBEC
40.0000 mg | DELAYED_RELEASE_TABLET | Freq: Two times a day (BID) | ORAL | Status: DC
  Administered 2022-01-07 – 2022-01-09 (×5): 40 mg via ORAL
  Filled 2022-01-07 (×5): qty 1

## 2022-01-07 MED ORDER — HEPARIN (PORCINE) 25000 UT/250ML-% IV SOLN
1500.0000 [IU]/h | INTRAVENOUS | Status: DC
Start: 2022-01-07 — End: 2022-01-09
  Administered 2022-01-07: 1400 [IU]/h via INTRAVENOUS
  Administered 2022-01-08 (×2): 1500 [IU]/h via INTRAVENOUS
  Filled 2022-01-07 (×3): qty 250

## 2022-01-07 MED ORDER — WARFARIN SODIUM 7.5 MG PO TABS
7.5000 mg | ORAL_TABLET | Freq: Once | ORAL | Status: AC
Start: 2022-01-07 — End: 2022-01-07
  Administered 2022-01-07: 7.5 mg via ORAL
  Filled 2022-01-07: qty 1

## 2022-01-07 MED ORDER — PEG-KCL-NACL-NASULF-NA ASC-C 100 G PO SOLR
0.5000 | Freq: Once | ORAL | Status: AC
  Administered 2022-01-07: 100 g via ORAL
  Filled 2022-01-07: qty 1

## 2022-01-07 MED ORDER — PHENYLEPHRINE 80 MCG/ML (10ML) SYRINGE FOR IV PUSH (FOR BLOOD PRESSURE SUPPORT)
PREFILLED_SYRINGE | INTRAVENOUS | Status: DC | PRN
  Administered 2022-01-07 (×2): 160 ug via INTRAVENOUS
  Administered 2022-01-07: 80 ug via INTRAVENOUS

## 2022-01-07 MED ORDER — PROPOFOL 500 MG/50ML IV EMUL
INTRAVENOUS | Status: DC | PRN
  Administered 2022-01-07: 75 ug/kg/min via INTRAVENOUS

## 2022-01-07 MED ORDER — SODIUM CHLORIDE 0.9 % IV SOLN: INTRAVENOUS | Status: DC

## 2022-01-07 MED ORDER — LACTATED RINGERS IV SOLN: INTRAVENOUS | Status: DC

## 2022-01-07 SURGICAL SUPPLY — 22 items

## 2022-01-07 NOTE — Transfer of Care (Signed)
Immediate Anesthesia Transfer of Care Note  Patient: [name]  Procedure(s) Performed: COLONOSCOPY WITH PROPOFOL  Patient Location: PACU  Anesthesia Type:MAC  Level of Consciousness: awake, alert  and oriented  Airway & Oxygen Therapy: Patient Spontanous Breathing and Patient connected to nasal cannula oxygen  Post-op Assessment: Report given to RN and Post -op Vital signs reviewed and stable  Post vital signs: Reviewed and stable  Last Vitals:  Vitals Value Taken Time  BP 116/67 01/07/22 1242  Temp    Pulse 68 01/07/22 1244  Resp 18 01/07/22 1244  SpO2 92 % 01/07/22 1244  Vitals shown include unvalidated device data.  Last Pain:  Vitals:   01/07/22 1118  TempSrc: Tympanic  PainSc: 0-No pain      Patients Stated Pain Goal: 1 (85/63/14 9702)  Complications: No notable events documented.

## 2022-01-07 NOTE — Anesthesia Postprocedure Evaluation (Signed)
Anesthesia Post Note  Patient: [name]  Procedure(s) Performed: COLONOSCOPY WITH PROPOFOL     Patient location during evaluation: Endoscopy Anesthesia Type: MAC Level of consciousness: awake and alert Pain management: pain level controlled Vital Signs Assessment: post-procedure vital signs reviewed and stable Respiratory status: spontaneous breathing, nonlabored ventilation, respiratory function stable and patient connected to nasal cannula oxygen Cardiovascular status: blood pressure returned to baseline and stable Postop Assessment: no apparent nausea or vomiting Anesthetic complications: no   No notable events documented.  Last Vitals:  Vitals:   01/07/22 1118 01/07/22 1245  BP: (!) 126/44 124/63  Pulse: 61 73  Resp: 16 18  Temp: (!) 35.8 C (!) 36.4 C  SpO2: 98% 91%    Last Pain:  Vitals:   01/07/22 1245  TempSrc:   PainSc: 0-No pain                 Barnet Glasgow

## 2022-01-07 NOTE — Progress Notes (Signed)
[place] for heparin (while warfarin on hold) Indication:  MVR/AVR/PAF  No Known Allergies  Patient Measurements: Height: '5\' 6"'$  (167.6 cm) Weight: 111.2 kg (245 lb 1.6 oz) IBW/kg (Calculated) : 59.3 Heparin Dosing Weight: 84 kg  Vital Signs: Temp: 97.4 F (36.3 C) (08/09 1953) BP: 136/59 (08/09 1953) Pulse Rate: 61 (08/09 1953)  Labs: Recent Labs    01/04/22 0413 01/04/22 1207 01/05/22 0530 01/06/22 0413 01/07/22 0020  HGB 8.0*  --  7.9* 8.0* 7.8*  HCT 26.5*  --  26.2* 27.1* 27.3*  PLT 240  --  220 241 233  LABPROT 25.5*  --  22.1* 19.7* 17.7*  INR 2.3*  --  2.0* 1.7* 1.5*  HEPARINUNFRC  --    < > 0.30 0.25* 0.19*  CREATININE 1.40*  --   --  1.28* 1.26*   < > = values in this interval not displayed.     Estimated Creatinine Clearance: 58.5 mL/min (A) (by C-G formula based on SCr of 1.26 mg/dL (H)).   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  Heparin held 8/9 for endoscopy. Level obtained prior to gtt being held was slightly subtherapeutic (0.25) on infusion at 1200 units/hr. Hgb 8. Per MD post-op care orders: 1.  OK to resume heparin drip NOW, without bolus. 2.  Turn heparin drip back OFF tomorrow 01/07/2022 at 0800, in anticipation of noon colonoscopy tomorrow 01/07/2022  8/10 AM update:  Heparin level low  Goal of Therapy:  Heparin level 0.3-0.5 units/ml Monitor platelets by anticoagulation protocol: Yes   Plan:  Inc heparin to 1400 units/hr Heparin off 8/10 0800 in anticipation of procedure F/U heparin re-start s/p procedure   Narda Bonds, PharmD, BCPS Clinical Pharmacist Phone: [phone]

## 2022-01-07 NOTE — Progress Notes (Signed)
[facility] for heparin (while warfarin on hold) Indication:  MVR/AVR/PAF  No Known Allergies  Patient Measurements: Height: 5' 0.5" (153.7 cm) Weight: 111.1 kg (245 lb) IBW/kg (Calculated) : 46.65 Heparin Dosing Weight: 84 kg  Vital Signs: Temp: 97.8 F (36.6 C) (08/10 2200) Temp Source: Oral (08/10 2200) BP: 128/73 (08/10 2200) Pulse Rate: 64 (08/10 2200)  Labs: Recent Labs    01/05/22 0530 01/06/22 0413 01/07/22 0020 01/07/22 2231  HGB 7.9* 8.0* 7.8*  --   HCT 26.2* 27.1* 27.3*  --   PLT 220 241 233  --   LABPROT 22.1* 19.7* 17.7*  --   INR 2.0* 1.7* 1.5*  --   HEPARINUNFRC 0.30 0.25* 0.19* 0.24*  CREATININE  --  1.28* 1.26*  --      Estimated Creatinine Clearance: 53 mL/min (A) (by C-G formula based on SCr of 1.26 mg/dL (H)).   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  Heparin held 8/9 for endoscopy. Level obtained prior to gtt being held was slightly subtherapeutic (0.25) on infusion at 1200 units/hr. Hgb 8. Per MD post-op care orders: 1.  OK to resume heparin drip NOW, without bolus. 2.  Turn heparin drip back OFF tomorrow 01/07/2022 at 0800, in anticipation of noon colonoscopy tomorrow 01/07/2022  8/10 PM update:  Heparin level low after re-start s/p procedure  Warfarin re-started as well INR 1.5  Goal of Therapy:  Heparin level 0.3-0.5 units/ml Monitor platelets by anticoagulation protocol: Yes   Plan:  Inc heparin to 1500 units/hr Re-check heparin level with AM labs  Narda Bonds, PharmD, [place] Pharmacist Phone: [phone]

## 2022-01-07 NOTE — Op Note (Signed)
[facility] Patient Name: [name] Procedure Date : 01/07/2022 MRN: [id number] Attending MD: Arta Silence , MD Date of Birth: 19-Jan-1961 CSN: [id number] Age: 61 Admit Type: Outpatient Procedure:                Colonoscopy Indications:              Heme positive stool, Iron deficiency anemia                            secondary to chronic blood loss Providers:                Arta Silence, MD, Ladoris Gene, RN, Luan Moore, Technician, Viann Fish, CRNA Referring MD:             [facility] Medicines:                Monitored Anesthesia Care Complications:            No immediate complications. Estimated Blood Loss:     Estimated blood loss: none. Procedure:                Pre-Anesthesia Assessment:                           - Prior to the procedure, a History and Physical                            was performed, and patient medications and                            allergies were reviewed. The patient's tolerance of                            previous anesthesia was also reviewed. The risks                            and benefits of the procedure and the sedation                            options and risks were discussed with the patient.                            All questions were answered, and informed consent                            was obtained. Prior Anticoagulants: The patient has                            taken heparin, last dose was 4 hours pre-procedure.                            ASA Grade Assessment: III - A patient with severe  systemic disease. After reviewing the risks and                            benefits, the patient was deemed in satisfactory                            condition to undergo the procedure.                           After obtaining informed consent, the colonoscope                            was passed under direct vision. Throughout the                             procedure, the patient's blood pressure, pulse, and                            oxygen saturations were monitored continuously. The                            [id number] ([id number]) Olympus coloscope was                            introduced through the anus and advanced to the the                            cecum, identified by appendiceal orifice and                            ileocecal valve. The colonoscopy was performed                            without difficulty. The patient tolerated the                            procedure well. The quality of the bowel                            preparation was good. Scope In: 12:18:47 PM Scope Out: 12:34:53 PM Scope Withdrawal Time: 0 hours 9 minutes 18 seconds  Total Procedure Duration: 0 hours 16 minutes 6 seconds  Findings:      Hemorrhoids were found on perianal exam.      Many medium-mouthed diverticula were found in the sigmoid colon,       descending colon and transverse colon.      No old or fresh blood was seen to the extent of our examination.      Internal hemorrhoids were found during retroflexion. The hemorrhoids       were mild.      The exam was otherwise without abnormality on direct and retroflexion       views. Impression:               - Hemorrhoids found on perianal exam.                           -  Diverticulosis in the sigmoid colon, in the                            descending colon and in the transverse colon.                           - Internal hemorrhoids.                           - The examination was otherwise normal on direct                            and retroflexion views.                           - Hemorrhoids could explain hemoccult-positive                            stool; no explanation for anemia (valvular disease,                            renal disease, chronic disease all other potential                            causes of anemia in this patient). Recommendation:           - Return  patient to hospital ward for ongoing care.                           - Mechanical soft diet today.                           - Continue present medications.                           - OK to resume heparin now.                           - OK to resume Warfarin from GI perspective.                           - Do not anticipate need for any further GI                            procedures in absence of overt (not occult)                            bleeding.                           - Consider repeat colonoscopy 10 years, pending                            patient's clinical course.                           -  [facility] will sign-off; patient can follow-up                            with [place] on as-needed basis; please call with any                            questions; thank you for the consultation. Procedure Code(s):        --- Professional ---                           [phone], Colonoscopy, flexible; diagnostic, including                            collection of specimen(s) by brushing or washing,                            when performed (separate procedure) Diagnosis Code(s):        --- Professional ---                           K64.8, Other hemorrhoids                           R19.5, Other fecal abnormalities                           D50.0, Iron deficiency anemia secondary to blood                            loss (chronic)                           K57.30, Diverticulosis of large intestine without                            perforation or abscess without bleeding CPT copyright 2019 American Medical Association. All rights reserved. The codes documented in this report are preliminary and upon coder review may  be revised to meet current compliance requirements. Arta Silence, MD 01/07/2022 12:50:23 PM This report has been signed electronically. Number of Addenda: 0

## 2022-01-07 NOTE — Progress Notes (Signed)
Mobility Specialist Progress Note:   01/07/22 0956  Mobility  Activity Transferred to/from [facility]  Level of Assistance Independent  Assistive Device [facility]  Distance Ambulated (ft) 2 ft  Activity Response Tolerated well  $Mobility charge 1 Mobility   Pt limited d/t colonoscopy prep. No complaints of pain. Will follow-up for hallway ambulation after colonoscopy.   [facility] Karder Goodin Mobility Specialist

## 2022-01-07 NOTE — Progress Notes (Signed)
[place] for Heparin / Warfarin Indication:  MVR/AVR/PAF  No Known Allergies  Patient Measurements: Height: 5' 0.5" (153.7 cm) Weight: 111.1 kg (245 lb) IBW/kg (Calculated) : 46.65 Heparin Dosing Weight: 84 kg  Labs: Recent Labs    01/05/22 0530 01/06/22 0413 01/07/22 0020  HGB 7.9* 8.0* 7.8*  HCT 26.2* 27.1* 27.3*  PLT 220 241 233  LABPROT 22.1* 19.7* 17.7*  INR 2.0* 1.7* 1.5*  HEPARINUNFRC 0.30 0.25* 0.19*  CREATININE  --  1.28* 1.26*     Estimated Creatinine Clearance: 53 mL/min (A) (by C-G formula based on SCr of 1.26 mg/dL (H)).   Assessment: 61 yo female on warfarin PTA for mechanical mitral and aortic valves with PAF presents w/ symptomatic anemia thought to be due to GIB now s/p colonoscopy and EGD with no overt bleeding, GI has signed off with ok to resume anti-coagulation.  Warfarin dose PTA -> 7.5 mg MWF, 3.75 mg other days per recent ant-coag visit note  Goal of Therapy:  Heparin level 0.3-0.5 units/ml Monitor platelets by anticoagulation protocol: Yes INR 2.5 to 3.5   Plan:  Heparin at 1400 units / hr Warfarin 7.5 mg po x 1 dose at 1600 pm Heparin level at 2300 pm Daily heparin level, CBC, INR  Thank you Anette Guarneri, PharmD

## 2022-01-07 NOTE — Interval H&P Note (Signed)
History and Physical Interval Note:  01/07/2022 12:04 PM  [name]  has presented today for surgery, with the diagnosis of anemia, hemoccult-positive stool.  The various methods of treatment have been discussed with the patient and family. After consideration of risks, benefits and other options for treatment, the patient has consented to  Procedure(s): COLONOSCOPY WITH PROPOFOL (N/A) as a surgical intervention.  The patient's history has been reviewed, patient examined, no change in status, stable for surgery.  I have reviewed the patient's chart and labs.  Questions were answered to the patient's satisfaction.     Landry Dyke

## 2022-01-07 NOTE — Anesthesia Procedure Notes (Signed)
Procedure Name: MAC Date/Time: 01/07/2022 12:10 PM  Performed by: Griffin Dakin, CRNAPre-anesthesia Checklist: Patient identified, Emergency Drugs available, Suction available, Patient being monitored and Timeout performed Patient Re-evaluated:Patient Re-evaluated prior to induction Oxygen Delivery Method: Nasal cannula Induction Type: IV induction Placement Confirmation: positive ETCO2 and breath sounds checked- equal and bilateral Dental Injury: Teeth and Oropharynx as per pre-operative assessment

## 2022-01-07 NOTE — TOC Initial Note (Signed)
Transition of Care [facility]) - Initial/Assessment Note    Patient Details  Name: [name] MRN: [id number] Date of Birth: [birth year]  Transition of Care [facility]) CM/SW Contact:    Zenon Mayo, RN Phone Number: 01/07/2022, 3:28 PM  Clinical Narrative:                 Here with Anemia, GIB , indep, uses oxygen at home ,4 liters with [employer]. Lives with twin sister, [name], who will transport her home at discharge. She states she uses [facility] on [address].  Her oxygen tank is in the room with her.  She had her colonoscopy done today, plan is to start coumadin again this evening, INR is 1.5 goal is 2, bridging with heparin .   She does not have any HH services and does not need any HH services at this time. TOC following.   Expected Discharge Plan: Home/Self Care Barriers to Discharge: Continued Medical Work up   Patient Goals and CMS Choice Patient states their goals for this hospitalization and ongoing recovery are:: return home with sister   Choice offered to / list presented to : NA  Expected Discharge Plan and Services Expected Discharge Plan: Home/Self Care In-house Referral: NA Discharge Planning Services: CM Consult Post Acute Care Choice: NA                     DME Agency: NA       HH Arranged: NA          Prior Living Arrangements/Services   Lives with:: Siblings Patient language and need for interpreter reviewed:: Yes Do you feel safe going back to the place where you live?: Yes      Need for Family Participation in Patient Care: Yes (Comment) Care giver support system in place?: Yes (comment) Current home services:  (home oxygen - [employer] 4 liters) Criminal Activity/Legal Involvement Pertinent to Current Situation/Hospitalization: No - Comment as needed  Activities of Daily Living Home Assistive Devices/Equipment: None ADL Screening (condition at time of admission) Patient's cognitive ability adequate to safely complete daily  activities?: Yes Is the patient deaf or have difficulty hearing?: No Does the patient have difficulty seeing, even when wearing glasses/contacts?: No Does the patient have difficulty concentrating, remembering, or making decisions?: No Patient able to express need for assistance with ADLs?: Yes Does the patient have difficulty dressing or bathing?: No Independently performs ADLs?: Yes (appropriate for developmental age) Does the patient have difficulty walking or climbing stairs?: Yes Weakness of Legs: Both Weakness of Arms/Hands: None  Permission Sought/Granted                  Emotional Assessment Appearance:: Appears stated age Attitude/Demeanor/Rapport: Engaged Affect (typically observed): Appropriate Orientation: : Oriented to Place, Oriented to Self, Oriented to  Time, Oriented to Situation Alcohol / Substance Use: Not Applicable Psych Involvement: No (comment)  Admission diagnosis:  Symptomatic anemia [D64.9] Acute GI bleeding [K92.2] Patient Active Problem List   Diagnosis Date Noted   Iron deficiency anemia 01/04/2022   Vitamin B12 deficiency 01/04/2022   Acute GI bleeding 01/03/2022   Acute on chronic blood loss anemia 01/03/2022   Anxiety 12/30/2021   Pulmonary HTN ([place])    Acute on chronic respiratory failure with hypoxia ([place]) 05/12/2021   Chronic diastolic CHF (congestive heart failure) ([place]) 02/40/9735   Hardware complicating wound infection ([place]) 02/04/2021   Acute osteomyelitis, pelvis, left (HCC)    Postoperative hematoma of musculoskeletal structure following musculoskeletal  procedure 01/19/2021   Hematoma of abdominal wall 01/03/2021   Hypovolemic shock ([place]) 12/30/2020   History of bariatric surgery 04/18/2018   Abdominal pain 02/02/2018   Hyperkalemia 02/02/2018   Acute kidney injury superimposed on CKD ([place]) 02/02/2018   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[place])    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 02/01/2018   Postoperative anemia due to chronic blood loss  on full anticoagulation 02/01/2018   AKI (acute kidney injury) ([place]) 02/01/2018   Acute renal failure superimposed on stage 3 chronic kidney disease ([place]) 01/26/2018   PAF (paroxysmal atrial fibrillation) ([place])    Acute respiratory failure ([place])    Hypoxia    Postprocedural hypotension    Chronic acquired lymphedema - Right lower extremity 01/24/2018   Hx of mechanical aortic valve replacement 01/24/2018   Morbid obesity ([place]) 01/24/2018   Obstructive sleep apnea 02/03/2017   Hereditary hemochromatosis ([place]) 06/23/2016   HX: long term anticoagulant use 02/11/2016   Chronic respiratory failure ([place]) 08/12/2015   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   Allergic rhinitis 05/02/2015   Symptomatic anemia 04/08/2015   Atrial flutter ([place])    Chronic anticoagulation 09/04/2014   Warfarin anticoagulation 08/14/2014   CHB (complete heart block) ([place]) 08/14/2014   First degree AV block 08/14/2014   History of aortic valve replacement with metallic valve 67/67/2094   Hypercarbia 08/07/2014   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 [address]Phone: [phone]: [phone]    Social Determinants of Health (SDOH) Interventions    Readmission Risk Interventions    01/07/2022    3:26 PM 05/13/2021    2:19 PM  Readmission Risk Prevention Plan  Transportation  Screening Complete Complete  PCP or Specialist Appt within 3-5 Days Complete Complete  HRI or Home Care Consult Complete Complete  Social Work Consult for RMount CobbPlanning/Counseling Complete Complete  Palliative Care Screening Not Applicable Not Applicable  Medication Review (Press photographer Complete Complete

## 2022-01-07 NOTE — Progress Notes (Signed)
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  Hgb 6.2 (from 9.73 months ago).  Hemoccult positive.  Hemodynamically stable.  EKG showed rate controlled atrial flutter.  On further questioning, she reports melena but no hematochezia.  Denies NSAID use.  01/06/2022 INR 1.8.  She was transfused 2 units of packed RBCs with improvement in her hemoglobin.  EGD 8/9 with no active evidence of acute bleeding in the upper GI.  Plan for colonoscopy in the morning    Assessment & Plan:   Principal Problem:   Acute GI bleeding Active Problems:   Essential hypertension   ILD (interstitial lung disease) (HCC)   Atrial flutter (HCC)   Symptomatic anemia   Stage 3 chronic kidney disease (HCC)   Chronic respiratory failure (HCC)   Hx of mechanical aortic valve replacement   Morbid obesity (HCC)   PAF (paroxysmal atrial fibrillation) (HCC)   Chronic anticoagulation   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HCC)   Chronic diastolic CHF (congestive heart failure) (HCC)   Acute on chronic blood loss anemia   Iron deficiency anemia   Vitamin B12 deficiency  #1 acute on chronic blood loss anemia due to GI bleed.  Patient is on Eliquis for mechanical aortic and mitral valve.   EGD 01/06/2022 -normal esophagus, gastritis, normal duodenum, no obvious source of occult blood.  Patient scheduled to have colonoscopy 01/07/2022.   Continue heparin bridging Patient received overall 2 units of packed RBCs and 2 units of iron ferrous gluconate. Hemoglobin today 7.8 from 8.4  Continue IV Protonix  Plan for colonoscopy 01/07/2022.  #2 history of  mechanical aortic and mitral valve on Coumadin currently on heparin  3.  Paroxysmal atrial fibrillation continue metoprolol anticoagulation as above  #4 history of interstitial lung disease on 4 L of oxygen 24/7.  #5 history of CKD stage III AA monitor stable currently  #6 anxiety and depression on Prozac  #7 iron deficiency anemia with history of gastric bypass status post iron infusion     Estimated body mass index is 39.56 kg/m as calculated from the following:   Height as of this encounter: '5\' 6"'$  (1.676 m).   Weight as of this encounter: 111.2 kg.  DVT prophylaxis: Heparin Code Status: Full code Family Communication: None at bedside Disposition Plan:  Status is: Inpatient   Consultants: GI  Procedures: EGD on 01/06/2022   antimicrobials none  Subjective:  she is drinking her prep for colonoscopy she has no other complaints denies seeing any blood in stool  objective: Vitals:   01/07/22 0400 01/07/22 0715 01/07/22 0920 01/07/22 1104  BP: 131/74 118/71  124/86  Pulse:  65  (!) 59  Resp: 18 19    Temp: 97.7 F (36.5 C) (!) 97.5 F (36.4 C)  97.9 F (36.6 C)  TempSrc: Oral Oral  Oral  SpO2: 94% 97% 98%   Weight:      Height:        Intake/Output Summary (Last 24 hours) at 01/07/2022 1113 Last data filed at 01/07/2022 0700 Gross per 24 hour  Intake 1081.85 ml  Output --  Net 1081.85 ml    Danley Danker  Weights   01/04/22 0502 01/05/22 0351 01/06/22 0422  Weight: 110.2 kg 110.7 kg 111.2 kg    Examination:  General exam: Appears calm and comfortable  Respiratory system: Clear to auscultation. Respiratory effort normal. Cardiovascular system: S1 & S2 heard, RRR. No JVD, murmurs, rubs, gallops or clicks. No pedal edema. Gastrointestinal system: Abdomen is nondistended, soft and nontender. No organomegaly or masses felt. Normal bowel sounds heard. Central nervous system: Alert and oriented. No focal neurological deficits. Extremities: Symmetric 5 x 5 power. Skin:  No rashes, lesions or ulcers Psychiatry: Judgement and insight appear normal. Mood & affect appropriate.     Data Reviewed: I have personally reviewed following labs and imaging studies  CBC: Recent Labs  Lab 01/02/22 2100 01/03/22 0922 01/04/22 0413 01/05/22 0530 01/06/22 0413 01/07/22 0020  WBC 9.3  --  7.0 6.6 7.3 7.2  NEUTROABS 7.2  --   --   --   --   --   HGB 6.2* 8.0* 8.0* 7.9* 8.0* 7.8*  HCT 21.7* 26.3* 26.5* 26.2* 27.1* 27.3*  MCV 86.5  --  85.8 84.8 86.6 88.9  PLT 219  --  240 220 241 702    Basic Metabolic Panel: Recent Labs  Lab 01/02/22 2100 01/04/22 0413 01/06/22 0413 01/07/22 0020  NA 139 140 142 141  K 4.5 3.9 3.8 4.4  CL 107 109 111 112*  CO2 '24 26 24 22  '$ GLUCOSE 89 93 79 89  BUN 35* 32* 28* 24*  CREATININE 1.30* 1.40* 1.28* 1.26*  CALCIUM 9.4 9.6 9.6 9.6  MG 2.4 2.1 2.2  --   PHOS  --  4.4 3.9  --     GFR: Estimated Creatinine Clearance: 58.5 mL/min (A) (by C-G formula based on SCr of 1.26 mg/dL (H)). Liver Function Tests: Recent Labs  Lab 01/02/22 2100 01/04/22 0413 01/06/22 0413 01/07/22 0020  AST 19  --   --  18  ALT 8  --   --  10  ALKPHOS 61  --   --  62  BILITOT 1.1  --   --  1.1  PROT 6.7  --   --  6.7  ALBUMIN 3.7 3.8 3.8 3.7    No results for input(s): "LIPASE", "AMYLASE" in the last 168 hours. No results for input(s): "AMMONIA" in the last 168 hours. Coagulation Profile: Recent Labs  Lab 01/03/22 0922 01/04/22 0413 01/05/22 0530 01/06/22 0413 01/07/22 0020  INR 2.8* 2.3* 2.0* 1.7* 1.5*    Cardiac Enzymes: No results for input(s): "CKTOTAL", "CKMB", "CKMBINDEX", "TROPONINI" in the last 168 hours. BNP (last 3 results) Recent Labs    09/01/21 1408  PROBNP 2,651*    HbA1C: No results for input(s): "HGBA1C" in the last 72 hours. CBG: Recent Labs  Lab 01/06/22 0008 01/06/22 0608 01/06/22 1651 01/07/22 0005 01/07/22 0616  GLUCAP 86 90 89 85 87    Lipid Profile: No results for input(s): "CHOL", "HDL",  "LDLCALC", "TRIG", "CHOLHDL", "LDLDIRECT" in the last 72 hours. Thyroid Function Tests: No results for input(s): "TSH", "T4TOTAL", "FREET4", "T3FREE", "THYROIDAB" in the last 72 hours. Anemia Panel: Recent Labs    01/06/22 0413  VITAMINB12 >7,500*  FOLATE 20.1  FERRITIN 111  TIBC 407  IRON 287*  RETICCTPCT 4.2*    Sepsis Labs: No results for input(s): "PROCALCITON", "LATICACIDVEN" in the last 168 hours.  Recent Results (from the past 240 hour(s))  Resp Panel by RT-PCR (Flu A&B, Covid) Anterior Nasal Swab     Status: None   Collection  Time: 01/02/22 10:00 PM   Specimen: Anterior Nasal Swab  Result Value Ref Range Status   SARS Coronavirus 2 by RT PCR NEGATIVE NEGATIVE Final    Comment: (NOTE) SARS-CoV-2 target nucleic acids are NOT DETECTED.  The SARS-CoV-2 RNA is generally detectable in upper respiratory specimens during the acute phase of infection. The lowest concentration of SARS-CoV-2 viral copies this assay can detect is 138 copies/mL. A negative result does not preclude SARS-Cov-2 infection and should not be used as the sole basis for treatment or other patient management decisions. A negative result may occur with  improper specimen collection/handling, submission of specimen other than nasopharyngeal swab, presence of viral mutation(s) within the areas targeted by this assay, and inadequate number of viral copies(<138 copies/mL). A negative result must be combined with clinical observations, patient history, and epidemiological information. The expected result is Negative.  Fact Sheet for Patients:  EntrepreneurPulse.com.au  Fact Sheet for Healthcare Providers:  IncredibleEmployment.be  This test is no t yet approved or cleared by the [place] FDA and  has been authorized for detection and/or diagnosis of SARS-CoV-2 by FDA under an Emergency Use Authorization (EUA). This EUA will remain  in effect (meaning this test can  be used) for the duration of the COVID-19 declaration under Section 564(b)(1) of the Act, 21 U.S.C.section 360bbb-3(b)(1), unless the authorization is terminated  or revoked sooner.       Influenza A by PCR NEGATIVE NEGATIVE Final   Influenza B by PCR NEGATIVE NEGATIVE Final    Comment: (NOTE) The Xpert Xpress SARS-CoV-2/FLU/RSV plus assay is intended as an aid in the diagnosis of influenza from Nasopharyngeal swab specimens and should not be used as a sole basis for treatment. Nasal washings and aspirates are unacceptable for Xpert Xpress SARS-CoV-2/FLU/RSV testing.  Fact Sheet for Patients: EntrepreneurPulse.com.au  Fact Sheet for Healthcare Providers: IncredibleEmployment.be  This test is not yet approved or cleared by the [place] FDA and has been authorized for detection and/or diagnosis of SARS-CoV-2 by FDA under an Emergency Use Authorization (EUA). This EUA will remain in effect (meaning this test can be used) for the duration of the COVID-19 declaration under Section 564(b)(1) of the Act, 21 U.S.C. section 360bbb-3(b)(1), unless the authorization is terminated or revoked.  Performed at [facility], [place] [address] [place], [place] [address]          Radiology Studies: No results found.      Scheduled Meds:  allopurinol  150 mg Oral QHS   vitamin B-12  1,000 mcg Oral Daily   FLUoxetine  20 mg Oral Daily   metoprolol tartrate  50 mg Oral Daily   montelukast  10 mg Oral Daily   pantoprazole (PROTONIX) IV  40 mg Intravenous Q12H   pravastatin  80 mg Oral Daily   umeclidinium-vilanterol  1 puff Inhalation Daily   Continuous Infusions:  sodium chloride       LOS: 4 days    Time spent: 35 minutes Georgette Shell, MD 01/07/2022, 11:13 AM

## 2022-01-08 DIAGNOSIS — K922 Gastrointestinal hemorrhage, unspecified: Secondary | ICD-10-CM | POA: Diagnosis not present

## 2022-01-08 LAB — CBC
HCT: 27.9 % — ABNORMAL LOW (ref 36.0–46.0)
Hemoglobin: 8 g/dL — ABNORMAL LOW (ref 12.0–15.0)
MCH: 25.7 pg — ABNORMAL LOW (ref 26.0–34.0)
MCHC: 28.7 g/dL — ABNORMAL LOW (ref 30.0–36.0)
MCV: 89.7 fL (ref 80.0–100.0)
Platelets: 222 10*3/uL (ref 150–400)
RBC: 3.11 MIL/uL — ABNORMAL LOW (ref 3.87–5.11)
RDW: 19.1 % — ABNORMAL HIGH (ref 11.5–15.5)
WBC: 6.7 10*3/uL (ref 4.0–10.5)
nRBC: 0.4 % — ABNORMAL HIGH (ref 0.0–0.2)

## 2022-01-08 LAB — PROTIME-INR
INR: 1.6 — ABNORMAL HIGH (ref 0.8–1.2)
Prothrombin Time: 19.1 seconds — ABNORMAL HIGH (ref 11.4–15.2)

## 2022-01-08 LAB — HEPARIN LEVEL (UNFRACTIONATED): Heparin Unfractionated: 0.35 IU/mL (ref 0.30–0.70)

## 2022-01-08 MED ORDER — WARFARIN SODIUM 7.5 MG PO TABS
7.5000 mg | ORAL_TABLET | Freq: Once | ORAL | Status: AC
Start: 2022-01-08 — End: 2022-01-08
  Administered 2022-01-08: 7.5 mg via ORAL
  Filled 2022-01-08: qty 1

## 2022-01-08 NOTE — Progress Notes (Signed)
Mobility Specialist Progress Note:   01/08/22 1400  Mobility  Activity Ambulated with assistance in hallway  Level of [place] wheel walker  Distance Ambulated (ft) 200 ft  Activity Response Tolerated well  $Mobility charge 1 Mobility   Pt received in bed willing to participate in mobility. No complaints of pain. Left EOB with call bell in reach and all needs met.   [facility] [name] Mobility Specialist

## 2022-01-08 NOTE — Progress Notes (Signed)
Mobility Specialist Progress Note:   01/08/22 1015  Mobility  Activity Refused mobility   Pt stating she didn't sleep well and is wanting to rest. Will follow-up after lunch for hallway ambulation.   [facility] Davinci Glotfelty Mobility Specialist

## 2022-01-08 NOTE — Care Management Important Message (Signed)
Important Message  Patient Details  Name: [name] MRN: [id number] Date of Birth: April 27, 1961   Medicare Important Message Given:  Yes     Shelda Altes 01/08/2022, 9:43 AM

## 2022-01-08 NOTE — Progress Notes (Signed)
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  Hgb 6.2 (from 9.73 months ago).  Hemoccult positive.  Hemodynamically stable.  EKG showed rate controlled atrial flutter.  On further questioning, she reports melena but no hematochezia.  Denies NSAID use.  01/06/2022 INR 1.8.  She was transfused 2 units of packed RBCs with improvement in her hemoglobin.  EGD 8/9 with no active evidence of acute bleeding in the upper GI.     Assessment & Plan:   Principal Problem:   Acute GI bleeding Active Problems:   Essential hypertension   ILD (interstitial lung disease) (HCC)   Atrial flutter (HCC)   Symptomatic anemia   Stage 3 chronic kidney disease (HCC)   Chronic respiratory failure (HCC)   Hx of mechanical aortic valve replacement   Morbid obesity (HCC)   PAF (paroxysmal atrial fibrillation) (HCC)   Chronic anticoagulation   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HCC)   Chronic diastolic CHF (congestive heart failure) (HCC)   Acute on chronic blood loss anemia   Iron deficiency anemia   Vitamin B12 deficiency  #1 acute on chronic blood loss anemia due to GI bleed.  Patient is on Eliquis for mechanical aortic and mitral valve.   EGD 01/06/2022 -normal esophagus, gastritis, normal duodenum, no obvious source of occult blood.  Patient scheduled to have colonoscopy 01/07/2022 -hemorrhoids found on perianal exam.  Diverticulosis in the sigmoid colon descending colon and transverse colon.  Internal hemorrhoids.  Exam was otherwise normal. Continue heparin bridging Patient received overall 2 units of packed RBCs and 2 units of iron  ferrous gluconate. Hemoglobin today 7.8 from 8.4  Continue IV Protonix  Plan for colonoscopy 01/07/2022.  #2 history of mechanical aortic and mitral valve on Coumadin currently on heparin INR 1.6.  Will need to wait till therapeutic INR above 2.5.  3.  Paroxysmal atrial fibrillation continue metoprolol anticoagulation as above  #4 history of interstitial lung disease on 4 L of oxygen 24/7.  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   Estimated body mass index is 47.52 kg/m as calculated from the following:   Height as of this encounter: 5' 0.5" (1.537 m).   Weight as of this encounter: 112.2 kg.  DVT prophylaxis: Heparin and Coumadin Code Status: Full code Family Communication: None at bedside Disposition Plan:  Status is: Inpatient   Consultants: GI  Procedures: EGD on 01/06/2022   antimicrobials none  Subjective:  She feels weak tired anxious to go home  objective: Vitals:   01/08/22 0505 01/08/22 0722 01/08/22 0746 01/08/22 1102  BP: 112/69 (!) 148/86  111/81  Pulse: (!) 59 (!) 158    Resp: '18 18  18  '$ Temp: 98.2 F (36.8 C) 97.7 F (36.5 C)  97.8 F (36.6 C)  TempSrc: Oral Oral  Oral  SpO2: 92% (!) 89% 94% 97%  Weight: 112.2 kg     Height:  Intake/Output Summary (Last 24 hours) at 01/08/2022 1407 Last data filed at 01/08/2022 0910 Gross per 24 hour  Intake 961.11 ml  Output --  Net 961.11 ml    Filed Weights   01/06/22 0422 01/07/22 1118 01/08/22 0505  Weight: 111.2 kg 111.1 kg 112.2 kg    Examination:  General exam: Appears calm and comfortable  Respiratory system: Clear to auscultation. Respiratory effort normal. Cardiovascular system: S1 & S2 heard, RRR. No JVD, murmurs, rubs,  gallops or clicks. No pedal edema. Gastrointestinal system: Abdomen is nondistended, soft and nontender. No organomegaly or masses felt. Normal bowel sounds heard. Central nervous system: Alert and oriented. No focal neurological deficits. Extremities: Symmetric 5 x 5 power. Skin: No rashes, lesions or ulcers Psychiatry: Judgement and insight appear normal. Mood & affect appropriate.     Data Reviewed: I have personally reviewed following labs and imaging studies  CBC: Recent Labs  Lab 01/02/22 2100 01/03/22 0922 01/04/22 0413 01/05/22 0530 01/06/22 0413 01/07/22 0020 01/08/22 0610  WBC 9.3  --  7.0 6.6 7.3 7.2 6.7  NEUTROABS 7.2  --   --   --   --   --   --   HGB 6.2*   < > 8.0* 7.9* 8.0* 7.8* 8.0*  HCT 21.7*   < > 26.5* 26.2* 27.1* 27.3* 27.9*  MCV 86.5  --  85.8 84.8 86.6 88.9 89.7  PLT 219  --  240 220 241 233 222   < > = values in this interval not displayed.    Basic Metabolic Panel: Recent Labs  Lab 01/02/22 2100 01/04/22 0413 01/06/22 0413 01/07/22 0020  NA 139 140 142 141  K 4.5 3.9 3.8 4.4  CL 107 109 111 112*  CO2 '24 26 24 22  '$ GLUCOSE 89 93 79 89  BUN 35* 32* 28* 24*  CREATININE 1.30* 1.40* 1.28* 1.26*  CALCIUM 9.4 9.6 9.6 9.6  MG 2.4 2.1 2.2  --   PHOS  --  4.4 3.9  --     GFR: Estimated Creatinine Clearance: 53.3 mL/min (A) (by C-G formula based on SCr of 1.26 mg/dL (H)). Liver Function Tests: Recent Labs  Lab 01/02/22 2100 01/04/22 0413 01/06/22 0413 01/07/22 0020  AST 19  --   --  18  ALT 8  --   --  10  ALKPHOS 61  --   --  62  BILITOT 1.1  --   --  1.1  PROT 6.7  --   --  6.7  ALBUMIN 3.7 3.8 3.8 3.7    No results for input(s): "LIPASE", "AMYLASE" in the last 168 hours. No results for input(s): "AMMONIA" in the last 168 hours. Coagulation Profile: Recent Labs  Lab 01/04/22 0413 01/05/22 0530 01/06/22 0413 01/07/22 0020 01/08/22 0610  INR 2.3* 2.0* 1.7* 1.5* 1.6*    Cardiac Enzymes: No results for input(s): "CKTOTAL",  "CKMB", "CKMBINDEX", "TROPONINI" in the last 168 hours. BNP (last 3 results) Recent Labs    09/01/21 1408  PROBNP 2,651*    HbA1C: No results for input(s): "HGBA1C" in the last 72 hours. CBG: Recent Labs  Lab 01/06/22 0008 01/06/22 0608 01/06/22 1651 01/07/22 0005 01/07/22 0616  GLUCAP 86 90 89 85 87    Lipid Profile: No results for input(s): "CHOL", "HDL", "LDLCALC", "TRIG", "CHOLHDL", "LDLDIRECT" in the last 72 hours. Thyroid Function Tests: No results for input(s): "TSH", "T4TOTAL", "FREET4", "T3FREE", "THYROIDAB" in the last 72 hours. Anemia Panel: Recent Labs    01/06/22 0413  VITAMINB12 >  7,500*  FOLATE 20.1  FERRITIN 111  TIBC 407  IRON 287*  RETICCTPCT 4.2*    Sepsis Labs: No results for input(s): "PROCALCITON", "LATICACIDVEN" in the last 168 hours.  Recent Results (from the past 240 hour(s))  Resp Panel by RT-PCR (Flu A&B, Covid) Anterior Nasal Swab     Status: None   Collection Time: 01/02/22 10:00 PM   Specimen: Anterior Nasal Swab  Result Value Ref Range Status   SARS Coronavirus 2 by RT PCR NEGATIVE NEGATIVE Final    Comment: (NOTE) SARS-CoV-2 target nucleic acids are NOT DETECTED.  The SARS-CoV-2 RNA is generally detectable in upper respiratory specimens during the acute phase of infection. The lowest concentration of SARS-CoV-2 viral copies this assay can detect is 138 copies/mL. A negative result does not preclude SARS-Cov-2 infection and should not be used as the sole basis for treatment or other patient management decisions. A negative result may occur with  improper specimen collection/handling, submission of specimen other than nasopharyngeal swab, presence of viral mutation(s) within the areas targeted by this assay, and inadequate number of viral copies(<138 copies/mL). A negative result must be combined with clinical observations, patient history, and epidemiological information. The expected result is Negative.  Fact Sheet for  Patients:  EntrepreneurPulse.com.au  Fact Sheet for Healthcare Providers:  IncredibleEmployment.be  This test is no t yet approved or cleared by the [place] FDA and  has been authorized for detection and/or diagnosis of SARS-CoV-2 by FDA under an Emergency Use Authorization (EUA). This EUA will remain  in effect (meaning this test can be used) for the duration of the COVID-19 declaration under Section 564(b)(1) of the Act, 21 U.S.C.section 360bbb-3(b)(1), unless the authorization is terminated  or revoked sooner.       Influenza A by PCR NEGATIVE NEGATIVE Final   Influenza B by PCR NEGATIVE NEGATIVE Final    Comment: (NOTE) The Xpert Xpress SARS-CoV-2/FLU/RSV plus assay is intended as an aid in the diagnosis of influenza from Nasopharyngeal swab specimens and should not be used as a sole basis for treatment. Nasal washings and aspirates are unacceptable for Xpert Xpress SARS-CoV-2/FLU/RSV testing.  Fact Sheet for Patients: EntrepreneurPulse.com.au  Fact Sheet for Healthcare Providers: IncredibleEmployment.be  This test is not yet approved or cleared by the [place] FDA and has been authorized for detection and/or diagnosis of SARS-CoV-2 by FDA under an Emergency Use Authorization (EUA). This EUA will remain in effect (meaning this test can be used) for the duration of the COVID-19 declaration under Section 564(b)(1) of the Act, 21 U.S.C. section 360bbb-3(b)(1), unless the authorization is terminated or revoked.  Performed at [facility], [place] [address] [place], [place] [address]          Radiology Studies: No results found.      Scheduled Meds:  allopurinol  150 mg Oral QHS   vitamin B-12  1,000 mcg Oral Daily   FLUoxetine  20 mg Oral Daily   metoprolol tartrate  50 mg Oral Daily   montelukast  10 mg Oral Daily   pantoprazole  40 mg Oral BID   pravastatin  80 mg Oral  Daily   umeclidinium-vilanterol  1 puff Inhalation Daily   warfarin  7.5 mg Oral ONCE-1600   Warfarin - Pharmacist Dosing Inpatient   Does not apply q1600   Continuous Infusions:  heparin 1,500 Units/hr (01/08/22 0539)     LOS: 5 days    Time spent: 35 minutes Georgette Shell, MD 01/08/2022, 2:07 PM

## 2022-01-08 NOTE — Progress Notes (Signed)
[place] for Heparin / Warfarin Indication:  MVR/AVR/PAF  No Known Allergies  Patient Measurements: Height: 5' 0.5" (153.7 cm) Weight: 112.2 kg (247 lb 6.4 oz) (scale b) IBW/kg (Calculated) : 46.65 Heparin Dosing Weight: 84 kg  Labs: Recent Labs    01/06/22 0413 01/07/22 0020 01/07/22 2231 01/08/22 0610  HGB 8.0* 7.8*  --  8.0*  HCT 27.1* 27.3*  --  27.9*  PLT 241 233  --  222  LABPROT 19.7* 17.7*  --  19.1*  INR 1.7* 1.5*  --  1.6*  HEPARINUNFRC 0.25* 0.19* 0.24* 0.35  CREATININE 1.28* 1.26*  --   --      Estimated Creatinine Clearance: 53.3 mL/min (A) (by C-G formula based on SCr of 1.26 mg/dL (H)).   Assessment: 61 yo female on warfarin PTA for mechanical mitral and aortic valves with PAF presents w/ symptomatic anemia thought to be due to GIB now s/p colonoscopy and EGD with no overt bleeding, GI has signed off with ok to resume anti-coagulation. -INR= 1.6  PTA dose: 7.'5mg'$  TTSS, 3.'75mg'$  MWF per pt report. Last clinic visit reports 7.'5mg'$  MW and 3.'75mg'$  all other days  Goal of Therapy:  Heparin level 0.3-0.5 units/ml Monitor platelets by anticoagulation protocol: Yes INR 2.5 to 3.5   Plan:  Continue Heparin at 1400 units / hr Warfarin 7.5 mg po x 1 dose at 1600 pm Heparin level at 2300 pm Daily heparin level, CBC, INR  Hildred Laser, PharmD Clinical Pharmacist **Pharmacist phone directory can now be found on [email or web address] ([email or web address]).  Listed under [place].

## 2022-01-09 ENCOUNTER — Encounter (HOSPITAL_COMMUNITY): Payer: Self-pay | Admitting: Gastroenterology

## 2022-01-09 DIAGNOSIS — K922 Gastrointestinal hemorrhage, unspecified: Secondary | ICD-10-CM | POA: Diagnosis not present

## 2022-01-09 LAB — CBC
HCT: 29.4 % — ABNORMAL LOW (ref 36.0–46.0)
Hemoglobin: 8.5 g/dL — ABNORMAL LOW (ref 12.0–15.0)
MCH: 26.2 pg (ref 26.0–34.0)
MCHC: 28.9 g/dL — ABNORMAL LOW (ref 30.0–36.0)
MCV: 90.7 fL (ref 80.0–100.0)
Platelets: 210 10*3/uL (ref 150–400)
RBC: 3.24 MIL/uL — ABNORMAL LOW (ref 3.87–5.11)
RDW: 19.8 % — ABNORMAL HIGH (ref 11.5–15.5)
WBC: 6.9 10*3/uL (ref 4.0–10.5)
nRBC: 0 % (ref 0.0–0.2)

## 2022-01-09 LAB — PROTIME-INR
INR: 1.8 — ABNORMAL HIGH (ref 0.8–1.2)
Prothrombin Time: 20.6 seconds — ABNORMAL HIGH (ref 11.4–15.2)

## 2022-01-09 LAB — HEPARIN LEVEL (UNFRACTIONATED): Heparin Unfractionated: 0.36 IU/mL (ref 0.30–0.70)

## 2022-01-09 MED ORDER — ENOXAPARIN SODIUM 120 MG/0.8ML IJ SOSY: 1.0000 mg/kg | PREFILLED_SYRINGE | Freq: Two times a day (BID) | INTRAMUSCULAR | 0 refills | Status: DC

## 2022-01-09 MED ORDER — PANTOPRAZOLE SODIUM 40 MG PO TBEC: 40.0000 mg | DELAYED_RELEASE_TABLET | Freq: Every day | ORAL | 0 refills | Status: DC

## 2022-01-09 MED ORDER — VITAMIN C 250 MG PO TABS: 500.0000 mg | ORAL_TABLET | Freq: Every day | ORAL | Status: DC

## 2022-01-09 MED ORDER — IRON (FERROUS SULFATE) 325 (65 FE) MG PO TABS: 325.0000 mg | ORAL_TABLET | Freq: Two times a day (BID) | ORAL | 4 refills | Status: DC

## 2022-01-09 MED ORDER — FUROSEMIDE 40 MG PO TABS: 40.0000 mg | ORAL_TABLET | Freq: Every day | ORAL | Status: DC

## 2022-01-09 MED ORDER — POTASSIUM CHLORIDE CRYS ER 20 MEQ PO TBCR: 20.0000 meq | EXTENDED_RELEASE_TABLET | Freq: Every day | ORAL | 0 refills | Status: DC

## 2022-01-09 MED ORDER — FUROSEMIDE 40 MG PO TABS: 20.0000 mg | ORAL_TABLET | Freq: Every day | ORAL | Status: DC

## 2022-01-09 MED ORDER — ENOXAPARIN SODIUM 120 MG/0.8ML IJ SOSY
120.0000 mg | PREFILLED_SYRINGE | Freq: Two times a day (BID) | INTRAMUSCULAR | Status: DC
  Administered 2022-01-09: 120 mg via SUBCUTANEOUS
  Filled 2022-01-09: qty 0.8

## 2022-01-09 MED ORDER — WARFARIN SODIUM 7.5 MG PO TABS: 7.5000 mg | ORAL_TABLET | Freq: Once | ORAL | Status: DC

## 2022-01-09 NOTE — Progress Notes (Signed)
ANTICOAGULATION CONSULT NOTE - Addendum  Pharmacy Consult for Heparin / Warfarin Indication:  MVR/AVR/PAF  No Known Allergies  Patient Measurements: Height: 5' 0.5" (153.7 cm) Weight: 112.2 kg (247 lb 6.4 oz) (scale b) IBW/kg (Calculated) : 46.65 Heparin Dosing Weight: 84 kg  Labs: Recent Labs    01/07/22 0020 01/07/22 2231 01/08/22 0610 01/09/22 0317  HGB 7.8*  --  8.0* 8.5*  HCT 27.3*  --  27.9* 29.4*  PLT 233  --  222 210  LABPROT 17.7*  --  19.1* 20.6*  INR 1.5*  --  1.6* 1.8*  HEPARINUNFRC 0.19* 0.24* 0.35 0.36  CREATININE 1.26*  --   --   --     Estimated Creatinine Clearance: 53.3 mL/min (A) (by C-G formula based on SCr of 1.26 mg/dL (H)).   Assessment: 61 yo female on warfarin PTA for mechanical mitral and aortic valves with PAF presented w/ symptomatic anemia thought to be due to GIB now s/p colonoscopy and EGD with no overt bleeding, GI has signed off with ok to resume anti-coagulation.  Patient requires heparin bridge until INR therapeutic. Patient reports warfarin PTA dose: 7.'5mg'$  TTSS, 3.'75mg'$  MWF per pt report. Last clinic visit reports 7.'5mg'$  MWF and 3.'75mg'$  all other days.   Heparin level 0.36 on drip rate 1500 units/hr therapeutic. Last heparin level therapeutic as well. INR 1.8 subtherapeutic after 2 days of AC with warfarin. Hgb 8.5 and plt 210. No s/sx bleeding noted in chart.    Goal of Therapy:  Heparin level 0.3-0.5 units/ml Monitor platelets by anticoagulation protocol: Yes INR 2.5 to 3.5   Plan:  Continue Heparin at 1500 units / hr Warfarin 7.5 mg po x 1 dose today Monitor daily heparin level, INR, and CBC Continue to monitor H&H   Discharge recommendations: Start enoxaparin 120 mg sq q12h for warfarin bridge in preparation to discharge  Resume PTA warfarin regimen of 7.5 mg PO daily except for 3.75 MWF Check INR next Tuesday/Wednesday     Gena Fray, PharmD PGY1 Pharmacy Resident   01/09/2022 10:41 AM  **Pharmacist phone  directory can now be found on [email or web address] ([email or web address]).  Listed under [place].

## 2022-01-09 NOTE — Progress Notes (Signed)
[place] for Heparin / Warfarin Indication:  MVR/AVR/PAF  No Known Allergies  Patient Measurements: Height: 5' 0.5" (153.7 cm) Weight: 112.2 kg (247 lb 6.4 oz) (scale b) IBW/kg (Calculated) : 46.65 Heparin Dosing Weight: 84 kg  Labs: Recent Labs    01/07/22 0020 01/07/22 2231 01/08/22 0610 01/09/22 0317  HGB 7.8*  --  8.0* 8.5*  HCT 27.3*  --  27.9* 29.4*  PLT 233  --  222 210  LABPROT 17.7*  --  19.1* 20.6*  INR 1.5*  --  1.6* 1.8*  HEPARINUNFRC 0.19* 0.24* 0.35 0.36  CREATININE 1.26*  --   --   --     Estimated Creatinine Clearance: 53.3 mL/min (A) (by C-G formula based on SCr of 1.26 mg/dL (H)).   Assessment: 61 yo female on warfarin PTA for mechanical mitral and aortic valves with PAF presented w/ symptomatic anemia thought to be due to GIB now s/p colonoscopy and EGD with no overt bleeding, GI has signed off with ok to resume anti-coagulation.  Patient requires heparin bridge until INR therapeutic. Warfarin PTA dose: 7.'5mg'$  TTSS, 3.'75mg'$  MWF per pt report. Last clinic visit reports 7.'5mg'$  MW and 3.'75mg'$  all other days.   Heparin level 0.36 on drip rate 1500 units/hr therapeutic. Last heparin level therapeutic as well. INR 1.8 subtherapeutic after 2 days of AC with warfarin. Hgb 8.5 and plt 210. No s/sx bleeding noted in chart.    Goal of Therapy:  Heparin level 0.3-0.5 units/ml Monitor platelets by anticoagulation protocol: Yes INR 2.5 to 3.5   Plan:  Continue Heparin at 1500 units / hr Warfarin 7.5 mg po x 1 dose today Monitor daily heparin level, INR, and CBC Continue to monitor H&H     Gena Fray, PharmD PGY1 Pharmacy Resident   01/09/2022 8:21 AM  **Pharmacist phone directory can now be found on [email or web address] ([email or web address]).  Listed under [place].

## 2022-01-09 NOTE — Discharge Summary (Signed)
Physician Discharge Summary  [name] YSA:[id number] DOB: 07-15-1960 DOA: 01/02/2022  PCP: Sue Lush, PA-C  Admit date: 01/02/2022 Discharge date: 01/09/2022  Admitted From: Home Disposition: Home  Recommendations for Outpatient Follow-up:  Follow up with PCP in 1-2 weeks Please obtain BMP/CBC in one week Please follow up with cardiologist in [place] Follow-up with hematologist Dr. 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  Hgb 6.2 (from 9.73 months ago).  Hemoccult positive.  Hemodynamically stable.  EKG showed rate controlled atrial flutter.  On further questioning, she reports melena but no hematochezia.  Denies NSAID use.  01/06/2022 INR 1.8.  She was transfused 2 units of packed RBCs with improvement in her hemoglobin.  EGD 8/9 with no active evidence of acute bleeding in the upper GI.     Discharge Diagnoses:  Principal Problem:   Acute GI bleeding Active Problems:   Essential hypertension   ILD (interstitial lung disease) (HCC)   Atrial flutter (HCC)   Symptomatic anemia   Stage 3 chronic kidney disease (HCC)   Chronic respiratory failure (HCC)   Hx of mechanical aortic valve replacement   Morbid obesity (HCC)   PAF (paroxysmal atrial fibrillation) (HCC)   Chronic anticoagulation   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HCC)   Chronic diastolic CHF (congestive heart failure) (HCC)   Acute on chronic blood loss anemia   Iron deficiency anemia   Vitamin B12 deficiency  #1 acute on chronic blood loss anemia of unclear etiology.   Patient had EGD and colonoscopy without any active bleeding or findings of any active bleeding.  Patient is on Coumadin at home for mechanical aortic and mitral valve.  She has a hematologist that she follows up at [facility].  She has a history of hemolytic anemia in the past currently her labs does not show any evidence of hemolysis.  She probably has anemia of chronic disease, secondary to CKD and iron deficiency. EGD 01/06/2022 -normal esophagus, gastritis, normal duodenum, no obvious source of occult blood.  Patient scheduled to have colonoscopy 01/07/2022 -hemorrhoids found on perianal exam.  Diverticulosis in the sigmoid colon descending colon and transverse colon.  Internal hemorrhoids.  Exam was otherwise normal. Patient received 2 units of packed RBCs and 2 units of iron gluconate during the hospital stay. She was discharged on Lovenox and Coumadin and recheck her INR 01/12/2022.  She is followed at the Coumadin clinic in [place].  Patient was very anxious to go home on Lovenox and Coumadin and did not want to stay in the hospital to be on heparin and Coumadin for therapeutic INR.  Discussed with her to stop the Lovenox once INR is above 2.5. She was discharged on iron sulfate and vitamin C.  Her hemoglobin was 8.5 on discharge.  #2 history of mechanical aortic and mitral valve on Coumadin -she received a dose of Lovenox prior to discharge.  Her INR was 1.8.    3.  Paroxysmal atrial fibrillation continue metoprolol anticoagulation as above   #4 history of interstitial lung disease on 4 L of oxygen 24/7.   #5 history of CKD stage III AA monitor stable currently   #  6 anxiety and depression on Prozac   Estimated body mass index is 47.52 kg/m as calculated from the following:   Height as of this encounter: 5' 0.5" (1.537 m).   Weight as of this encounter: 112.2 kg.  Discharge Instructions  Discharge Instructions     Call MD for:  difficulty breathing, headache or visual disturbances    Complete by: As directed    Call MD for:  persistant dizziness or light-headedness   Complete by: As directed    Call MD for:  persistant nausea and vomiting   Complete by: As directed    Diet - low sodium heart healthy   Complete by: As directed    Diet - low sodium heart healthy   Complete by: As directed    Increase activity slowly   Complete by: As directed    Increase activity slowly   Complete by: As directed       Allergies as of 01/09/2022   No Known Allergies      Medication List     STOP taking these medications    acetaminophen 650 MG CR tablet Commonly known as: TYLENOL       TAKE these medications    albuterol 108 (90 Base) MCG/ACT inhaler Commonly known as: VENTOLIN HFA Inhale 2 puffs into the lungs as needed for wheezing or shortness of breath.   allopurinol 300 MG tablet Commonly known as: ZYLOPRIM Take 300 mg by mouth at bedtime.   Anoro Ellipta 62.5-25 MCG/ACT Aepb Generic drug: umeclidinium-vilanterol Inhale 1 puff into the lungs daily.   cetirizine 10 MG tablet Commonly known as: ZYRTEC Take 10 mg by mouth daily.   enoxaparin 120 MG/0.8ML injection Commonly known as: LOVENOX Inject 0.74 mLs (111 mg total) into the skin 2 (two) times daily for 3 days.   FLUoxetine 20 MG capsule Commonly known as: PROZAC Take 20 mg by mouth daily. What changed: Another medication with the same name was removed. Continue taking this medication, and follow the directions you see here.   furosemide 40 MG tablet Commonly known as: LASIX Take 1 tablet (40 mg total) by mouth daily. What changed: how much to take   metoprolol tartrate 50 MG tablet Commonly known as: LOPRESSOR Take 1 tablet (50 mg total) by mouth daily.   montelukast 10 MG tablet Commonly known as: SINGULAIR Take 10 mg by mouth daily.   OXYGEN Inhale 4 L into the lungs continuous.   pantoprazole 40 MG tablet Commonly known as: PROTONIX Take 1 tablet (40 mg total) by mouth daily.    potassium chloride SA 20 MEQ tablet Commonly known as: KLOR-CON M Take 1 tablet (20 mEq total) by mouth daily.   pravastatin 80 MG tablet Commonly known as: PRAVACHOL Take 80 mg by mouth daily.   warfarin 7.5 MG tablet Commonly known as: COUMADIN Take as directed. If you are unsure how to take this medication, talk to your nurse or doctor. Original instructions: Take 0.5-1 tablets (3.75-7.5 mg total) by mouth See admin instructions. Take 7.'5mg'$  (1 full tablet) Monday and Friday. Take 3.'75mg'$  (half a tablet) Saturday, Tuesday, Wednesday, Thursday, Sunday. INR check once a week What changed: additional instructions        Follow-up Information     Sue Lush, PA-C. Go on 01/11/2022.   Specialty: Physician Assistant Why: '@2'$ :00pm Contact information: [address] [address] [phone]         Belva Crome, MD .   Specialty: Cardiology  Contact information: [address] [place] [address] [phone]                No Known Allergies  Consultations: GI   Procedures/Studies: DG Chest Portable 1 View  Result Date: 01/02/2022 CLINICAL DATA:  Chest pain EXAM: PORTABLE CHEST 1 VIEW COMPARISON:  06/09/2021 FINDINGS: Prior median sternotomy and valve replacement. Cardiomegaly. No confluent opacities, effusions or edema. No acute bony abnormality. IMPRESSION: Cardiomegaly.  No active disease. Electronically Signed   By: Rolm Baptise M.D.   On: 01/02/2022 21:07   (Echo, Carotid, EGD, Colonoscopy, ERCP)    Subjective:  Patient resting in bed in no acute distress anxious to go home She would like to go home on Lovenox and Coumadin which she has done before Discharge Exam: Vitals:   01/09/22 0724 01/09/22 0913  BP:  129/69  Pulse: 62 61  Resp: 18 18  Temp:  (!) 97.4 F (36.3 C)  SpO2: 97% 96%   Vitals:   01/08/22 1102 01/08/22 2100 01/09/22 0724 01/09/22 0913  BP: 111/81 132/64  129/69  Pulse:  62 62 61   Resp: '18  18 18  '$ Temp: 97.8 F (36.6 C) 97.6 F (36.4 C)  (!) 97.4 F (36.3 C)  TempSrc: Oral Oral  Oral  SpO2: 97% (!) 85% 97% 96%  Weight:      Height:        General: Pt is alert, awake, not in acute distress Cardiovascular: RRR, S1/S2 +, no rubs, no gallops Respiratory: CTA bilaterally, no wheezing, no rhonchi Abdominal: Soft, NT, ND, bowel sounds + Extremities: Trace edema    The results of significant diagnostics from this hospitalization (including imaging, microbiology, ancillary and laboratory) are listed below for reference.     Microbiology: Recent Results (from the past 240 hour(s))  Resp Panel by RT-PCR (Flu A&B, Covid) Anterior Nasal Swab     Status: None   Collection Time: 01/02/22 10:00 PM   Specimen: Anterior Nasal Swab  Result Value Ref Range Status   SARS Coronavirus 2 by RT PCR NEGATIVE NEGATIVE Final    Comment: (NOTE) SARS-CoV-2 target nucleic acids are NOT DETECTED.  The SARS-CoV-2 RNA is generally detectable in upper respiratory specimens during the acute phase of infection. The lowest concentration of SARS-CoV-2 viral copies this assay can detect is 138 copies/mL. A negative result does not preclude SARS-Cov-2 infection and should not be used as the sole basis for treatment or other patient management decisions. A negative result may occur with  improper specimen collection/handling, submission of specimen other than nasopharyngeal swab, presence of viral mutation(s) within the areas targeted by this assay, and inadequate number of viral copies(<138 copies/mL). A negative result must be combined with clinical observations, patient history, and epidemiological information. The expected result is Negative.  Fact Sheet for Patients:  EntrepreneurPulse.com.au  Fact Sheet for Healthcare Providers:  IncredibleEmployment.be  This test is no t yet approved or cleared by the [place] FDA and  has been  authorized for detection and/or diagnosis of SARS-CoV-2 by FDA under an Emergency Use Authorization (EUA). This EUA will remain  in effect (meaning this test can be used) for the duration of the COVID-19 declaration under Section 564(b)(1) of the Act, 21 U.S.C.section 360bbb-3(b)(1), unless the authorization is terminated  or revoked sooner.       Influenza A by PCR NEGATIVE NEGATIVE Final   Influenza B by PCR NEGATIVE NEGATIVE Final    Comment: (NOTE) The Xpert Xpress SARS-CoV-2/FLU/RSV plus  assay is intended as an aid in the diagnosis of influenza from Nasopharyngeal swab specimens and should not be used as a sole basis for treatment. Nasal washings and aspirates are unacceptable for Xpert Xpress SARS-CoV-2/FLU/RSV testing.  Fact Sheet for Patients: EntrepreneurPulse.com.au  Fact Sheet for Healthcare Providers: IncredibleEmployment.be  This test is not yet approved or cleared by the [place] FDA and has been authorized for detection and/or diagnosis of SARS-CoV-2 by FDA under an Emergency Use Authorization (EUA). This EUA will remain in effect (meaning this test can be used) for the duration of the COVID-19 declaration under Section 564(b)(1) of the Act, 21 U.S.C. section 360bbb-3(b)(1), unless the authorization is terminated or revoked.  Performed at [facility], [place] [address] [place], [place] [address]      Labs: BNP (last 3 results) Recent Labs    05/12/21 1520 05/16/21 0934  BNP 479.2* 093.2*   Basic Metabolic Panel: Recent Labs  Lab 01/02/22 2100 01/04/22 0413 01/06/22 0413 01/07/22 0020  NA 139 140 142 141  K 4.5 3.9 3.8 4.4  CL 107 109 111 112*  CO2 '24 26 24 22  '$ GLUCOSE 89 93 79 89  BUN 35* 32* 28* 24*  CREATININE 1.30* 1.40* 1.28* 1.26*  CALCIUM 9.4 9.6 9.6 9.6  MG 2.4 2.1 2.2  --   PHOS  --  4.4 3.9  --    Liver Function Tests: Recent Labs  Lab 01/02/22 2100 01/04/22 0413 01/06/22 0413  01/07/22 0020  AST 19  --   --  18  ALT 8  --   --  10  ALKPHOS 61  --   --  62  BILITOT 1.1  --   --  1.1  PROT 6.7  --   --  6.7  ALBUMIN 3.7 3.8 3.8 3.7   No results for input(s): "LIPASE", "AMYLASE" in the last 168 hours. No results for input(s): "AMMONIA" in the last 168 hours. CBC: Recent Labs  Lab 01/02/22 2100 01/03/22 0922 01/05/22 0530 01/06/22 0413 01/07/22 0020 01/08/22 0610 01/09/22 0317  WBC 9.3   < > 6.6 7.3 7.2 6.7 6.9  NEUTROABS 7.2  --   --   --   --   --   --   HGB 6.2*   < > 7.9* 8.0* 7.8* 8.0* 8.5*  HCT 21.7*   < > 26.2* 27.1* 27.3* 27.9* 29.4*  MCV 86.5   < > 84.8 86.6 88.9 89.7 90.7  PLT 219   < > 220 241 233 222 210   < > = values in this interval not displayed.   Cardiac Enzymes: No results for input(s): "CKTOTAL", "CKMB", "CKMBINDEX", "TROPONINI" in the last 168 hours. BNP: Invalid input(s): "POCBNP" CBG: Recent Labs  Lab 01/06/22 0008 01/06/22 0608 01/06/22 1651 01/07/22 0005 01/07/22 0616  GLUCAP 86 90 89 85 87   D-Dimer No results for input(s): "DDIMER" in the last 72 hours. Hgb A1c No results for input(s): "HGBA1C" in the last 72 hours. Lipid Profile No results for input(s): "CHOL", "HDL", "LDLCALC", "TRIG", "CHOLHDL", "LDLDIRECT" in the last 72 hours. Thyroid function studies No results for input(s): "TSH", "T4TOTAL", "T3FREE", "THYROIDAB" in the last 72 hours.  Invalid input(s): "FREET3" Anemia work up No results for input(s): "VITAMINB12", "FOLATE", "FERRITIN", "TIBC", "IRON", "RETICCTPCT" in the last 72 hours. Urinalysis    Component Value Date/Time   COLORURINE YELLOW 02/26/2021 1620   APPEARANCEUR CLEAR 02/26/2021 1620   LABSPEC 1.010 02/26/2021 1620   PHURINE 5.0 02/26/2021 1620  GLUCOSEU NEGATIVE 02/26/2021 1620   HGBUR SMALL (A) 02/26/2021 1620   BILIRUBINUR NEGATIVE 02/26/2021 1620   KETONESUR NEGATIVE 02/26/2021 1620   PROTEINUR 30 (A) 02/26/2021 1620   UROBILINOGEN 0.2 04/08/2015 0317   NITRITE NEGATIVE  02/26/2021 1620   LEUKOCYTESUR NEGATIVE 02/26/2021 1620   Sepsis Labs Recent Labs  Lab 01/06/22 0413 01/07/22 0020 01/08/22 0610 01/09/22 0317  WBC 7.3 7.2 6.7 6.9   Microbiology Recent Results (from the past 240 hour(s))  Resp Panel by RT-PCR (Flu A&B, Covid) Anterior Nasal Swab     Status: None   Collection Time: 01/02/22 10:00 PM   Specimen: Anterior Nasal Swab  Result Value Ref Range Status   SARS Coronavirus 2 by RT PCR NEGATIVE NEGATIVE Final    Comment: (NOTE) SARS-CoV-2 target nucleic acids are NOT DETECTED.  The SARS-CoV-2 RNA is generally detectable in upper respiratory specimens during the acute phase of infection. The lowest concentration of SARS-CoV-2 viral copies this assay can detect is 138 copies/mL. A negative result does not preclude SARS-Cov-2 infection and should not be used as the sole basis for treatment or other patient management decisions. A negative result may occur with  improper specimen collection/handling, submission of specimen other than nasopharyngeal swab, presence of viral mutation(s) within the areas targeted by this assay, and inadequate number of viral copies(<138 copies/mL). A negative result must be combined with clinical observations, patient history, and epidemiological information. The expected result is Negative.  Fact Sheet for Patients:  EntrepreneurPulse.com.au  Fact Sheet for Healthcare Providers:  IncredibleEmployment.be  This test is no t yet approved or cleared by the [place] FDA and  has been authorized for detection and/or diagnosis of SARS-CoV-2 by FDA under an Emergency Use Authorization (EUA). This EUA will remain  in effect (meaning this test can be used) for the duration of the COVID-19 declaration under Section 564(b)(1) of the Act, 21 U.S.C.section 360bbb-3(b)(1), unless the authorization is terminated  or revoked sooner.       Influenza A by PCR NEGATIVE NEGATIVE  Final   Influenza B by PCR NEGATIVE NEGATIVE Final    Comment: (NOTE) The Xpert Xpress SARS-CoV-2/FLU/RSV plus assay is intended as an aid in the diagnosis of influenza from Nasopharyngeal swab specimens and should not be used as a sole basis for treatment. Nasal washings and aspirates are unacceptable for Xpert Xpress SARS-CoV-2/FLU/RSV testing.  Fact Sheet for Patients: EntrepreneurPulse.com.au  Fact Sheet for Healthcare Providers: IncredibleEmployment.be  This test is not yet approved or cleared by the [place] FDA and has been authorized for detection and/or diagnosis of SARS-CoV-2 by FDA under an Emergency Use Authorization (EUA). This EUA will remain in effect (meaning this test can be used) for the duration of the COVID-19 declaration under Section 564(b)(1) of the Act, 21 U.S.C. section 360bbb-3(b)(1), unless the authorization is terminated or revoked.  Performed at [facility], [place] [address], [place], [place] [address]      Time coordinating discharge: 38 minutes  SIGNED:   Georgette [place], MD  [facility] 01/09/2022, 2:45 PM

## 2022-01-09 NOTE — TOC Transition Note (Signed)
Transition of Care [facility]) - CM/SW Discharge Note   Patient Details  Name: [name] MRN: [id number] Date of Birth: [birth year]  Transition of Care [facility] CM/SW Contact:  Carles Collet, RN Phone Number: 01/09/2022, 1:29 PM   Clinical Narrative:    Verified with [facility] that they have Lovenox in stock and cost is $1.40, covered through Medicaid. No other TOC needs identified at this time Family to provide transportation home per previous TOC notes   Final next level of care: Home/Self Care Barriers to Discharge: Continued Medical Work up   Patient Goals and CMS Choice Patient states their goals for this hospitalization and ongoing recovery are:: return home with sister   Choice offered to / list presented to : NA  Discharge Placement                       Discharge Plan and Services In-house Referral: NA Discharge Planning Services: CM Consult Post Acute Care Choice: NA            DME Agency: NA       HH Arranged: NA          Social Determinants of Health (SDOH) Interventions     Readmission Risk Interventions    01/07/2022    3:26 PM 05/13/2021    2:19 PM  Readmission Risk Prevention Plan  Transportation Screening Complete Complete  PCP or Specialist Appt within 3-5 Days Complete Complete  HRI or Home Care Consult Complete Complete  Social Work Consult for [place] Planning/Counseling Complete Complete  Palliative Care Screening Not Applicable Not Applicable  Medication Review Press photographer) Complete Complete

## 2022-01-12 ENCOUNTER — Ambulatory Visit (INDEPENDENT_AMBULATORY_CARE_PROVIDER_SITE_OTHER): Payer: Medicare Other

## 2022-01-12 DIAGNOSIS — Z5181 Encounter for therapeutic drug level monitoring: Secondary | ICD-10-CM | POA: Diagnosis not present

## 2022-01-12 DIAGNOSIS — I480 Paroxysmal atrial fibrillation: Secondary | ICD-10-CM

## 2022-01-12 LAB — POCT INR: INR: 2.3 (ref 2.0–3.0)

## 2022-01-12 NOTE — Patient Instructions (Addendum)
Description   Take last injection of Lovenox and take 1.5 tablets today. Tthen continue on same dosage of Warfarin 1/2 tablet every day except 1 tablet on Mondays, Wednesdays, Fridays.  Recheck INR in 1 week.  Coumadin Clinic # [phone].

## 2022-01-18 NOTE — Anesthesia Postprocedure Evaluation (Signed)
Anesthesia Post Note  Patient: [name]  Procedure(s) Performed: ESOPHAGOGASTRODUODENOSCOPY (EGD) WITH PROPOFOL     Patient location during evaluation: Endoscopy Anesthesia Type: MAC Level of consciousness: awake Pain management: pain level controlled Vital Signs Assessment: post-procedure vital signs reviewed and stable Cardiovascular status: stable Postop Assessment: no apparent nausea or vomiting Anesthetic complications: no   No notable events documented.  Last Vitals:  Vitals:   01/09/22 0724 01/09/22 0913  BP:  129/69  Pulse: 62 61  Resp: 18 18  Temp:  (!) 36.3 C  SpO2: 97% 96%    Last Pain:  Vitals:   01/09/22 0913  TempSrc: Oral  PainSc:                  Zafiro Routson

## 2022-01-19 ENCOUNTER — Ambulatory Visit (INDEPENDENT_AMBULATORY_CARE_PROVIDER_SITE_OTHER): Payer: Medicare Other

## 2022-01-19 DIAGNOSIS — I480 Paroxysmal atrial fibrillation: Secondary | ICD-10-CM

## 2022-01-19 DIAGNOSIS — Z5181 Encounter for therapeutic drug level monitoring: Secondary | ICD-10-CM

## 2022-01-19 LAB — POCT INR: INR: 2.9 (ref 2.0–3.0)

## 2022-01-19 NOTE — Progress Notes (Signed)
[facility]  [address] 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  This is based upon her having an elevated LDH and undetectable haptoglobin level.  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  She comes in today for routine follow-up.  Of note, the patient was recently hospitalized at [facility] for GI blood loss.  At that time, labs clearly showed evidence of iron deficiency anemia, which had not been seen previously.  During her hospitalization, the patient received 2 units of blood, as well as 2 doses of IV iron.  She comes in today to reassess her anemia.  Although there is the belief her 2 mechanical heart valves are likely behind her hemolytic anemia, she has been told that she is not a surgical candidate for mechanical valve replacements.  PHYSICAL EXAM:  Blood pressure 133/85, pulse (!) 115, temperature 98.8 F (37.1 C), resp. rate 16, height '5\' 6"'$  (1.676 m), weight 240 lb 9.6 oz (109.1 kg), SpO2 90 %. Wt Readings from Last 3 Encounters:  01/20/22 240 lb 9.6 oz (109.1 kg)  01/08/22 247 lb 6.4 oz (112.2 kg)  12/04/21 237 lb 6.4 oz (107.7 kg)   Body mass index is 38.83 kg/m. Performance status (ECOG): 2 - Symptomatic, <50% confined to bed Physical Exam Constitutional:      Appearance: Normal appearance. She is not ill-appearing.  HENT:     Mouth/Throat:     Mouth: Mucous membranes are moist.     Pharynx: Oropharynx is clear. No oropharyngeal exudate or posterior oropharyngeal erythema.  Cardiovascular:     Rate and Rhythm: Normal rate and regular rhythm.     Heart sounds: No murmur heard.    No friction rub. No gallop.  Pulmonary:     Effort: Pulmonary effort is normal. No respiratory distress.     Breath sounds: Normal breath  sounds. No wheezing, rhonchi or rales.  Abdominal:     General: Bowel sounds are normal. There is no distension.     Palpations: Abdomen is soft. There is no mass.     Tenderness: There is no abdominal tenderness.  Musculoskeletal:        General: No swelling.     Right lower leg: Swelling present. No edema.     Left lower leg: No edema.  Lymphadenopathy:     Cervical: No cervical adenopathy.     Upper Body:     Right upper body: No supraclavicular or axillary adenopathy.     Left upper body: No supraclavicular or axillary adenopathy.     Lower Body: No right inguinal adenopathy. No left inguinal adenopathy.  Skin:    General: Skin is warm.     Coloration: Skin is not jaundiced.     Findings: No lesion or rash.  Neurological:     General: No focal deficit present.     Mental Status: She is alert and oriented to person, place, and time. Mental status is at baseline.  Psychiatric:        Mood and Affect: Mood normal.        Behavior: Behavior normal.        Thought Content: Thought content normal.     LABS:      Latest Ref Rng & Units 01/20/2022  12:00 AM 01/09/2022    3:17 AM 01/08/2022    6:10 AM  CBC  WBC  5.0     6.9  6.7   Hemoglobin 12.0 - 16.0 9.8     8.5  8.0   Hematocrit 36 - 46 31     29.4  27.9   Platelets 150 - 400 K/uL 233     210  222      This result is from an external source.      Latest Ref Rng & Units 01/07/2022   12:20 AM 01/06/2022    4:13 AM 01/04/2022    4:13 AM  CMP  Glucose 70 - 99 mg/dL 89  79  93   BUN 8 - 23 mg/dL 24  28  32   Creatinine 0.44 - 1.00 mg/dL 1.26  1.28  1.40   Sodium 135 - 145 mmol/L 141  142  140   Potassium 3.5 - 5.1 mmol/L 4.4  3.8  3.9   Chloride 98 - 111 mmol/L 112  111  109   CO2 22 - 32 mmol/L '22  24  26   '$ Calcium 8.9 - 10.3 mg/dL 9.6  9.6  9.6   Total Protein 6.5 - 8.1 g/dL 6.7     Total Bilirubin 0.3 - 1.2 mg/dL 1.1     Alkaline Phos 38 - 126 U/L 62     AST 15 - 41 U/L 18     ALT 0 - 44 U/L 10       Latest  Reference Range & Units 01/20/22 09:58  LDH 98 - 192 U/L 309 (H)  (H): Data is abnormally high  ASSESSMENT & PLAN:  Assessment/Plan:  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  When evaluating her labs and her recent history, it definitely appears that her anemia is now multifactorial in nature.  As mentioned previously, labs during her recent hospitalization in [place] clearly showed evidence of iron deficiency anemia which led to her receiving blood and IV iron.  Her labs today also show she has a component of renal insufficiency which could also be factoring into her anemia.  Currently, her hemoglobin of 9.8 is decent.  Ideally, I want her hemoglobin to be at/above 10.  As she feels fine today, she will be followed conservatively over these next few months.  I will see her back in 3 months to reassess her anemia.  The patient understands all the plans discussed today and knows to contact our office before then if she develops fatigue or other problems which concern her progressive anemia.   Malachi Suderman Macarthur Critchley, MD

## 2022-01-19 NOTE — Patient Instructions (Signed)
Description   Continue on same dosage of Warfarin 1/2 tablet every day except 1 tablet on Mondays, Wednesdays, and Fridays.  Recheck INR in 2 weeks.  Coumadin Clinic # [phone].

## 2022-01-20 ENCOUNTER — Inpatient Hospital Stay: Payer: Medicare Other | Attending: Oncology | Admitting: Oncology

## 2022-01-20 ENCOUNTER — Encounter (HOSPITAL_BASED_OUTPATIENT_CLINIC_OR_DEPARTMENT_OTHER): Payer: Medicare Other | Admitting: Pulmonary Disease

## 2022-01-20 ENCOUNTER — Telehealth: Payer: Self-pay | Admitting: Oncology

## 2022-01-20 ENCOUNTER — Other Ambulatory Visit: Payer: Self-pay | Admitting: Oncology

## 2022-01-20 ENCOUNTER — Inpatient Hospital Stay: Payer: Medicare Other

## 2022-01-20 DIAGNOSIS — D589 Hereditary hemolytic anemia, unspecified: Secondary | ICD-10-CM | POA: Diagnosis present

## 2022-01-20 DIAGNOSIS — D599 Acquired hemolytic anemia, unspecified: Secondary | ICD-10-CM

## 2022-01-20 DIAGNOSIS — D500 Iron deficiency anemia secondary to blood loss (chronic): Secondary | ICD-10-CM | POA: Diagnosis not present

## 2022-01-20 LAB — CBC: RBC: 3.68 — AB (ref 3.87–5.11)

## 2022-01-20 LAB — LACTATE DEHYDROGENASE: LDH: 309 U/L — ABNORMAL HIGH (ref 98–192)

## 2022-01-20 LAB — CBC AND DIFFERENTIAL
HCT: 31 — AB (ref 36–46)
Hemoglobin: 9.8 — AB (ref 12.0–16.0)
Neutrophils Absolute: 3.55
Platelets: 233 10*3/uL (ref 150–400)
WBC: 5

## 2022-01-20 NOTE — Telephone Encounter (Signed)
Per 01/20/22 los next appt scheduled and confirmed with patient

## 2022-01-21 LAB — HAPTOGLOBIN: Haptoglobin: <10 mg/dL — ABNORMAL LOW (ref 37–355)

## 2022-01-22 ENCOUNTER — Other Ambulatory Visit: Payer: Self-pay | Admitting: Oncology

## 2022-01-22 DIAGNOSIS — D508 Other iron deficiency anemias: Secondary | ICD-10-CM

## 2022-02-02 ENCOUNTER — Ambulatory Visit: Payer: Medicare Other | Attending: Cardiology

## 2022-02-02 DIAGNOSIS — I480 Paroxysmal atrial fibrillation: Secondary | ICD-10-CM | POA: Insufficient documentation

## 2022-02-02 DIAGNOSIS — Z5181 Encounter for therapeutic drug level monitoring: Secondary | ICD-10-CM | POA: Insufficient documentation

## 2022-02-02 LAB — POCT INR: INR: 3.4 — AB (ref 2.0–3.0)

## 2022-02-02 NOTE — Patient Instructions (Signed)
Description   Continue on same dosage of Warfarin 1/2 tablet every day except 1 tablet on Mondays, Wednesdays, and Fridays.  Recheck INR in 4 weeks.  [facility] # [phone].

## 2022-02-09 ENCOUNTER — Ambulatory Visit
Admission: RE | Admit: 2022-02-09 | Discharge: 2022-02-09 | Disposition: A | Discharge status: Home / Self Care | Payer: Medicare Other | Source: Ambulatory Visit | Attending: Nurse Practitioner | Admitting: Nurse Practitioner

## 2022-02-09 DIAGNOSIS — Z1231 Encounter for screening mammogram for malignant neoplasm of breast: Secondary | ICD-10-CM

## 2022-02-15 ENCOUNTER — Other Ambulatory Visit: Payer: Self-pay | Admitting: Oncology

## 2022-02-15 ENCOUNTER — Inpatient Hospital Stay: Payer: Medicare Other | Attending: Oncology

## 2022-02-15 DIAGNOSIS — N1832 Chronic kidney disease, stage 3b: Secondary | ICD-10-CM | POA: Insufficient documentation

## 2022-02-15 DIAGNOSIS — D631 Anemia in chronic kidney disease: Secondary | ICD-10-CM | POA: Diagnosis not present

## 2022-02-15 DIAGNOSIS — D599 Acquired hemolytic anemia, unspecified: Secondary | ICD-10-CM

## 2022-02-15 DIAGNOSIS — D509 Iron deficiency anemia, unspecified: Secondary | ICD-10-CM | POA: Insufficient documentation

## 2022-02-15 DIAGNOSIS — D589 Hereditary hemolytic anemia, unspecified: Secondary | ICD-10-CM | POA: Insufficient documentation

## 2022-02-15 LAB — CBC: RBC: 3.04 — AB (ref 3.87–5.11)

## 2022-02-15 LAB — CBC AND DIFFERENTIAL
HCT: 27 — AB (ref 36–46)
Hemoglobin: 8.6 — AB (ref 12.0–16.0)
Neutrophils Absolute: 6.54
Platelets: 248 10*3/uL (ref 150–400)
WBC: 8.6

## 2022-02-15 LAB — LACTATE DEHYDROGENASE: LDH: 292 U/L — ABNORMAL HIGH (ref 98–192)

## 2022-02-16 LAB — HAPTOGLOBIN: Haptoglobin: <10 mg/dL — ABNORMAL LOW (ref 37–355)

## 2022-02-17 ENCOUNTER — Encounter: Payer: Self-pay | Admitting: Oncology

## 2022-02-17 DIAGNOSIS — D631 Anemia in chronic kidney disease: Secondary | ICD-10-CM | POA: Insufficient documentation

## 2022-02-18 ENCOUNTER — Telehealth: Payer: Self-pay | Admitting: Oncology

## 2022-02-18 NOTE — Telephone Encounter (Signed)
Contacted pt to schedule an appt. Unable to reach via phone, voicemail was left.   Scheduling Message Entered by Juanetta Beets on 02/17/2022 at 10:16 AM Priority: Routine <No visit type provided>  Department: [facility]  Provider:   Appointment Notes:  Dr. [name] wants this patient to start a shot to help improve her hemoglobin.  Please schedule pt for 1st injection in the next week, then she will need labs and injection every 3 weeks.  Scheduling Notes:

## 2022-02-18 NOTE — Telephone Encounter (Signed)
Patient returned my call and has been scheduled for these appts.   Scheduling Message Entered by Juanetta Beets on 02/17/2022 at 10:16 AM Priority: Routine <No visit type provided>  Department: [facility]  Provider:   Appointment Notes:  Dr. [name] wants this patient to start a shot to help improve her hemoglobin.  Please schedule pt for 1st injection in the next week, then she will need labs and injection every 3 weeks.  Scheduling Notes:

## 2022-02-25 ENCOUNTER — Inpatient Hospital Stay: Payer: Medicare Other

## 2022-02-25 VITALS — BP 125/61 | HR 64 | Temp 98.1°F | Resp 20 | Wt 240.0 lb

## 2022-02-25 DIAGNOSIS — N1832 Chronic kidney disease, stage 3b: Secondary | ICD-10-CM

## 2022-02-25 DIAGNOSIS — D631 Anemia in chronic kidney disease: Secondary | ICD-10-CM

## 2022-02-25 MED ORDER — EPOETIN ALFA-EPBX 20000 UNIT/ML IJ SOLN
20000.0000 [IU] | Freq: Once | INTRAMUSCULAR | Status: AC
  Administered 2022-02-25: 20000 [IU] via SUBCUTANEOUS
  Filled 2022-02-25: qty 1

## 2022-02-25 NOTE — Patient Instructions (Signed)

## 2022-03-02 ENCOUNTER — Ambulatory Visit: Payer: Medicare Other | Attending: Interventional Cardiology

## 2022-03-02 DIAGNOSIS — Z5181 Encounter for therapeutic drug level monitoring: Secondary | ICD-10-CM | POA: Diagnosis not present

## 2022-03-02 DIAGNOSIS — I480 Paroxysmal atrial fibrillation: Secondary | ICD-10-CM

## 2022-03-02 LAB — POCT INR: INR: 3.5 — AB (ref 2.0–3.0)

## 2022-03-02 NOTE — Patient Instructions (Signed)
Description   Eat greens today and then continue on same dosage of Warfarin 1/2 tablet every day except 1 tablet on Mondays, Wednesdays, and Fridays.  Recheck INR in 5 weeks.  [facility] # [phone].

## 2022-03-09 ENCOUNTER — Encounter: Payer: Self-pay | Admitting: Oncology

## 2022-03-18 ENCOUNTER — Inpatient Hospital Stay: Payer: Medicare Other | Attending: Oncology

## 2022-03-18 ENCOUNTER — Inpatient Hospital Stay: Payer: Medicare Other

## 2022-03-18 VITALS — BP 113/62 | HR 67 | Temp 97.9°F | Resp 18 | Ht 66.0 in | Wt 240.0 lb

## 2022-03-18 DIAGNOSIS — D631 Anemia in chronic kidney disease: Secondary | ICD-10-CM | POA: Diagnosis not present

## 2022-03-18 DIAGNOSIS — D509 Iron deficiency anemia, unspecified: Secondary | ICD-10-CM | POA: Insufficient documentation

## 2022-03-18 DIAGNOSIS — N1832 Chronic kidney disease, stage 3b: Secondary | ICD-10-CM | POA: Insufficient documentation

## 2022-03-18 DIAGNOSIS — D589 Hereditary hemolytic anemia, unspecified: Secondary | ICD-10-CM | POA: Diagnosis not present

## 2022-03-18 LAB — HEPATIC FUNCTION PANEL
ALT: 14 U/L (ref 7–35)
AST: 29 (ref 13–35)
Alkaline Phosphatase: 64 (ref 25–125)
Bilirubin, Total: 0.8

## 2022-03-18 LAB — COMPREHENSIVE METABOLIC PANEL
Albumin: 4.3 (ref 3.5–5.0)
Calcium: 9.5 (ref 8.7–10.7)

## 2022-03-18 LAB — BASIC METABOLIC PANEL
BUN: 23 — AB (ref 4–21)
CO2: 33 — AB (ref 13–22)
Chloride: 103 (ref 99–108)
Creatinine: 1.1 (ref 0.5–1.1)
Glucose: 77
Potassium: 4.1 mEq/L (ref 3.5–5.1)
Sodium: 142 (ref 137–147)

## 2022-03-18 LAB — CBC AND DIFFERENTIAL
HCT: 30 — AB (ref 36–46)
Hemoglobin: 9.4 — AB (ref 12.0–16.0)
Neutrophils Absolute: 5.69
Platelets: 223 10*3/uL (ref 150–400)
WBC: 7.3

## 2022-03-18 LAB — CBC: RBC: 3.49 — AB (ref 3.87–5.11)

## 2022-03-18 MED ORDER — EPOETIN ALFA-EPBX 20000 UNIT/ML IJ SOLN
20000.0000 [IU] | Freq: Once | INTRAMUSCULAR | Status: AC
  Administered 2022-03-18: 20000 [IU] via SUBCUTANEOUS
  Filled 2022-03-18: qty 1

## 2022-03-18 NOTE — Patient Instructions (Signed)

## 2022-04-06 ENCOUNTER — Ambulatory Visit: Payer: Medicare Other | Attending: Cardiology

## 2022-04-06 ENCOUNTER — Inpatient Hospital Stay: Payer: Medicare Other | Attending: Oncology

## 2022-04-06 ENCOUNTER — Other Ambulatory Visit: Payer: Self-pay

## 2022-04-06 DIAGNOSIS — N1832 Chronic kidney disease, stage 3b: Secondary | ICD-10-CM | POA: Diagnosis not present

## 2022-04-06 DIAGNOSIS — I480 Paroxysmal atrial fibrillation: Secondary | ICD-10-CM | POA: Diagnosis not present

## 2022-04-06 DIAGNOSIS — D631 Anemia in chronic kidney disease: Secondary | ICD-10-CM

## 2022-04-06 DIAGNOSIS — Z5181 Encounter for therapeutic drug level monitoring: Secondary | ICD-10-CM | POA: Diagnosis not present

## 2022-04-06 LAB — CBC WITH DIFFERENTIAL (CANCER CENTER ONLY)
Abs Immature Granulocytes: 0.02 10*3/uL (ref 0.00–0.07)
Basophils Absolute: 0 10*3/uL (ref 0.0–0.1)
Basophils Relative: 1 %
Eosinophils Absolute: 0.2 10*3/uL (ref 0.0–0.5)
Eosinophils Relative: 3 %
HCT: 35.3 % — ABNORMAL LOW (ref 36.0–46.0)
Hemoglobin: 10 g/dL — ABNORMAL LOW (ref 12.0–15.0)
Immature Granulocytes: 0 %
Lymphocytes Relative: 9 %
Lymphs Abs: 0.7 10*3/uL (ref 0.7–4.0)
MCH: 25.8 pg — ABNORMAL LOW (ref 26.0–34.0)
MCHC: 28.3 g/dL — ABNORMAL LOW (ref 30.0–36.0)
MCV: 91 fL (ref 80.0–100.0)
Monocytes Absolute: 0.8 10*3/uL (ref 0.1–1.0)
Monocytes Relative: 11 %
Neutro Abs: 5.6 10*3/uL (ref 1.7–7.7)
Neutrophils Relative %: 76 %
Platelet Count: 244 10*3/uL (ref 150–400)
RBC: 3.88 MIL/uL (ref 3.87–5.11)
RDW: 16.6 % — ABNORMAL HIGH (ref 11.5–15.5)
WBC Count: 7.3 10*3/uL (ref 4.0–10.5)
nRBC: 0 % (ref 0.0–0.2)

## 2022-04-06 LAB — CMP (CANCER CENTER ONLY)
ALT: 12 U/L (ref 0–44)
AST: 23 U/L (ref 15–41)
Albumin: 4.3 g/dL (ref 3.5–5.0)
Alkaline Phosphatase: 55 U/L (ref 38–126)
Anion gap: 15 (ref 5–15)
BUN: 31 mg/dL — ABNORMAL HIGH (ref 8–23)
CO2: 28 mmol/L (ref 22–32)
Calcium: 10.2 mg/dL (ref 8.9–10.3)
Chloride: 104 mmol/L (ref 98–111)
Creatinine: 1.26 mg/dL — ABNORMAL HIGH (ref 0.44–1.00)
GFR, Estimated: 48 mL/min — ABNORMAL LOW (ref >60)
Glucose, Bld: 90 mg/dL (ref 70–99)
Potassium: 4.4 mmol/L (ref 3.5–5.1)
Sodium: 147 mmol/L — ABNORMAL HIGH (ref 135–145)
Total Bilirubin: 0.9 mg/dL (ref 0.3–1.2)
Total Protein: 7.3 g/dL (ref 6.5–8.1)

## 2022-04-06 LAB — POCT INR: INR: 2.9 (ref 2.0–3.0)

## 2022-04-06 NOTE — Patient Instructions (Signed)
Description   Continue on same dosage of Warfarin 1/2 tablet every day except 1 tablet on Mondays, Wednesdays, and Fridays.  Recheck INR in 6 weeks.  [facility] # [phone].

## 2022-04-08 ENCOUNTER — Other Ambulatory Visit: Payer: Medicare Other

## 2022-04-08 ENCOUNTER — Inpatient Hospital Stay: Payer: Medicare Other

## 2022-04-08 VITALS — BP 110/60 | HR 59 | Temp 98.3°F | Resp 20 | Ht 66.0 in | Wt 250.2 lb

## 2022-04-08 DIAGNOSIS — N1832 Chronic kidney disease, stage 3b: Secondary | ICD-10-CM

## 2022-04-08 DIAGNOSIS — D631 Anemia in chronic kidney disease: Secondary | ICD-10-CM

## 2022-04-08 MED ORDER — EPOETIN ALFA-EPBX 20000 UNIT/ML IJ SOLN
20000.0000 [IU] | Freq: Once | INTRAMUSCULAR | Status: AC
  Administered 2022-04-08: 20000 [IU] via SUBCUTANEOUS
  Filled 2022-04-08: qty 1

## 2022-04-08 NOTE — Patient Instructions (Signed)

## 2022-04-25 NOTE — Progress Notes (Signed)
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  This is based upon her having an elevated LDH and undetectable haptoglobin level.  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  Over the past few months, she has been receiving monthly Retacrit injections, with the goal being to prevent her hemolysis from dropping her hemoglobin too low.  She claims to feel fine today.  She denies having increased fatigue or other symptoms which concern her for worsening anemia.    PHYSICAL EXAM:  Blood pressure (!) 168/81, pulse 85, temperature 97.8 F (36.6 C), resp. rate 18, height '5\' 6"'$  (1.676 m), weight 248 lb 3.2 oz (112.6 kg), SpO2 95 %. Wt Readings from Last 3 Encounters:  04/26/22 248 lb 3.2 oz (112.6 kg)  04/08/22 250 lb 4 oz (113.5 kg)  03/18/22 240 lb (108.9 kg)   Body mass index is 40.06 kg/m. Performance status (ECOG): 2 - Symptomatic, <50% confined to bed Physical Exam Constitutional:      Appearance: Normal appearance. She is not ill-appearing.  HENT:     Mouth/Throat:     Mouth: Mucous membranes are moist.     Pharynx: Oropharynx is clear. No oropharyngeal exudate or posterior oropharyngeal erythema.  Cardiovascular:     Rate and Rhythm: Normal rate and regular rhythm.     Heart sounds: No murmur heard.    No friction rub. No gallop.  Pulmonary:     Effort: Pulmonary effort is normal. No respiratory distress.     Breath sounds: Normal breath sounds. No wheezing, rhonchi or rales.  Abdominal:     General: Bowel sounds are normal. There is no distension.     Palpations: Abdomen is soft. There is no mass.     Tenderness: There is no abdominal tenderness.  Musculoskeletal:        General: No swelling.     Right lower  leg: Swelling present. No edema.     Left lower leg: No edema.  Lymphadenopathy:     Cervical: No cervical adenopathy.     Upper Body:     Right upper body: No supraclavicular or axillary adenopathy.     Left upper body: No supraclavicular or axillary adenopathy.     Lower Body: No right inguinal adenopathy. No left inguinal adenopathy.  Skin:    General: Skin is warm.     Coloration: Skin is not jaundiced.     Findings: No lesion or rash.  Neurological:     General: No focal deficit present.     Mental Status: She is alert and oriented to person, place, and time. Mental status is at baseline.  Psychiatric:        Mood and Affect: Mood normal.        Behavior: Behavior normal.        Thought Content: Thought content normal.     LABS:       Latest Ref Rng & Units 04/06/2022   12:05 PM 03/18/2022   12:00 AM 02/15/2022   12:00 AM  CBC  WBC 4.0 - 10.5 K/uL 7.3  7.3     8.6      Hemoglobin 12.0 - 15.0 g/dL 10.0  9.4     8.6  Hematocrit 36.0 - 46.0 % 35.'3  30     27      '$ Platelets 150 - 400 K/uL 244  223     248         This result is from an external source.      Latest Ref Rng & Units 04/26/2022    3:42 PM 04/06/2022   12:05 PM 03/18/2022   12:00 AM  CMP  Glucose 70 - 99 mg/dL 84  90    BUN 8 - 23 mg/dL '29  31  23      '$ Creatinine 0.44 - 1.00 mg/dL 1.27  1.26  1.1      Sodium 135 - 145 mmol/L 143  147  142      Potassium 3.5 - 5.1 mmol/L 4.1  4.4  4.1      Chloride 98 - 111 mmol/L 105  104  103      CO2 22 - 32 mmol/L 28  28  33      Calcium 8.9 - 10.3 mg/dL 9.7  10.2  9.5      Total Protein 6.5 - 8.1 g/dL 8.1  7.3    Total Bilirubin 0.3 - 1.2 mg/dL 0.8  0.9    Alkaline Phos 38 - 126 U/L 65  55  64      AST 15 - 41 U/L '21  23  29      '$ ALT 0 - 44 U/L '11  12  14         '$ This result is from an external source.    ASSESSMENT & PLAN:  Assessment/Plan:  A 60 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  Her  hemoglobin of 11.5 is the highest it has been in months.  This reflects the benefit from her monthly Retacrit injections.  As her hemoglobin is well above 10, her next Retacrit injection will be held.  Moving forward, her hemoglobin will continued to be followed monthly, with Retacrit being given if her hemoglobin falls below 10.  I will see her back in 4 months for repeat clinical assessment.  The patient understands all the plans discussed today and is in agreement with them.    Latif Nazareno Macarthur Critchley, MD

## 2022-04-26 ENCOUNTER — Inpatient Hospital Stay: Payer: Medicare Other

## 2022-04-26 ENCOUNTER — Other Ambulatory Visit: Payer: Self-pay | Admitting: Oncology

## 2022-04-26 ENCOUNTER — Inpatient Hospital Stay (INDEPENDENT_AMBULATORY_CARE_PROVIDER_SITE_OTHER): Payer: Medicare Other | Admitting: Oncology

## 2022-04-26 VITALS — BP 168/81 | HR 85 | Temp 97.8°F | Resp 18 | Ht 66.0 in | Wt 248.2 lb

## 2022-04-26 DIAGNOSIS — D508 Other iron deficiency anemias: Secondary | ICD-10-CM

## 2022-04-26 DIAGNOSIS — N1832 Chronic kidney disease, stage 3b: Secondary | ICD-10-CM | POA: Diagnosis not present

## 2022-04-26 DIAGNOSIS — D599 Acquired hemolytic anemia, unspecified: Secondary | ICD-10-CM | POA: Diagnosis not present

## 2022-04-26 DIAGNOSIS — D500 Iron deficiency anemia secondary to blood loss (chronic): Secondary | ICD-10-CM

## 2022-04-26 LAB — CMP (CANCER CENTER ONLY)
ALT: 11 U/L (ref 0–44)
AST: 21 U/L (ref 15–41)
Albumin: 4.3 g/dL (ref 3.5–5.0)
Alkaline Phosphatase: 65 U/L (ref 38–126)
Anion gap: 10 (ref 5–15)
BUN: 29 mg/dL — ABNORMAL HIGH (ref 8–23)
CO2: 28 mmol/L (ref 22–32)
Calcium: 9.7 mg/dL (ref 8.9–10.3)
Chloride: 105 mmol/L (ref 98–111)
Creatinine: 1.27 mg/dL — ABNORMAL HIGH (ref 0.44–1.00)
GFR, Estimated: 48 mL/min — ABNORMAL LOW (ref >60)
Glucose, Bld: 84 mg/dL (ref 70–99)
Potassium: 4.1 mmol/L (ref 3.5–5.1)
Sodium: 143 mmol/L (ref 135–145)
Total Bilirubin: 0.8 mg/dL (ref 0.3–1.2)
Total Protein: 8.1 g/dL (ref 6.5–8.1)

## 2022-04-26 LAB — LACTATE DEHYDROGENASE: LDH: 337 U/L — ABNORMAL HIGH (ref 98–192)

## 2022-04-26 LAB — IRON AND TIBC
Iron: 79 ug/dL (ref 28–170)
Saturation Ratios: 18 % (ref 10.4–31.8)
TIBC: 452 ug/dL — ABNORMAL HIGH (ref 250–450)
UIBC: 373 ug/dL

## 2022-04-26 LAB — VITAMIN B12: Vitamin B-12: 238 pg/mL (ref 180–914)

## 2022-04-26 LAB — CBC AND DIFFERENTIAL
HCT: 37 (ref 36–46)
Hemoglobin: 11.5 — AB (ref 12.0–16.0)
Neutrophils Absolute: 6.24
Platelets: 18 10*3/uL — AB (ref 150–400)
WBC: 8.1

## 2022-04-26 LAB — FOLATE: Folate: 15.5 ng/mL (ref >5.9)

## 2022-04-26 LAB — CBC: RBC: 4.41 (ref 3.87–5.11)

## 2022-04-26 LAB — FERRITIN: Ferritin: 11 ng/mL (ref 11–307)

## 2022-04-27 LAB — HAPTOGLOBIN: Haptoglobin: <10 mg/dL — ABNORMAL LOW (ref 37–355)

## 2022-04-29 ENCOUNTER — Ambulatory Visit: Payer: Medicare Other

## 2022-04-29 ENCOUNTER — Encounter: Payer: Self-pay | Admitting: Oncology

## 2022-04-29 ENCOUNTER — Other Ambulatory Visit: Payer: Medicare Other

## 2022-05-11 ENCOUNTER — Encounter: Payer: Self-pay | Admitting: Interventional Cardiology

## 2022-05-16 ENCOUNTER — Other Ambulatory Visit: Payer: Self-pay | Admitting: Interventional Cardiology

## 2022-05-17 ENCOUNTER — Other Ambulatory Visit: Payer: Self-pay | Admitting: Internal Medicine

## 2022-05-21 ENCOUNTER — Other Ambulatory Visit: Payer: Self-pay | Admitting: *Deleted

## 2022-05-21 DIAGNOSIS — I480 Paroxysmal atrial fibrillation: Secondary | ICD-10-CM

## 2022-05-21 MED ORDER — WARFARIN SODIUM 7.5 MG PO TABS: ORAL_TABLET | ORAL | 1 refills | Status: DC

## 2022-05-21 NOTE — Telephone Encounter (Signed)
Last INR 04/06/2022 and has r/s next appt to 06/01/22 Last OV 12/04/2021

## 2022-05-25 ENCOUNTER — Inpatient Hospital Stay: Payer: Medicare Other | Attending: Oncology

## 2022-05-25 DIAGNOSIS — D589 Hereditary hemolytic anemia, unspecified: Secondary | ICD-10-CM | POA: Diagnosis not present

## 2022-05-25 DIAGNOSIS — D599 Acquired hemolytic anemia, unspecified: Secondary | ICD-10-CM

## 2022-05-25 LAB — CBC WITH DIFFERENTIAL (CANCER CENTER ONLY)
Abs Immature Granulocytes: 0.02 10*3/uL (ref 0.00–0.07)
Basophils Absolute: 0 10*3/uL (ref 0.0–0.1)
Basophils Relative: 1 %
Eosinophils Absolute: 0.2 10*3/uL (ref 0.0–0.5)
Eosinophils Relative: 3 %
HCT: 41.5 % (ref 36.0–46.0)
Hemoglobin: 12.1 g/dL (ref 12.0–15.0)
Immature Granulocytes: 0 %
Lymphocytes Relative: 12 %
Lymphs Abs: 0.8 10*3/uL (ref 0.7–4.0)
MCH: 26.3 pg (ref 26.0–34.0)
MCHC: 29.2 g/dL — ABNORMAL LOW (ref 30.0–36.0)
MCV: 90.2 fL (ref 80.0–100.0)
Monocytes Absolute: 1 10*3/uL (ref 0.1–1.0)
Monocytes Relative: 14 %
Neutro Abs: 5 10*3/uL (ref 1.7–7.7)
Neutrophils Relative %: 70 %
Platelet Count: 223 10*3/uL (ref 150–400)
RBC: 4.6 MIL/uL (ref 3.87–5.11)
RDW: 17.9 % — ABNORMAL HIGH (ref 11.5–15.5)
WBC Count: 7 10*3/uL (ref 4.0–10.5)
nRBC: 0 % (ref 0.0–0.2)

## 2022-05-26 ENCOUNTER — Telehealth: Payer: Self-pay

## 2022-05-26 NOTE — Telephone Encounter (Signed)
Patient notified not to come for retacrit tomorow, hemaglobin 12.1, per Ulice Dash pharmacist. Patient voiced understanding and will keep future appts for labs.

## 2022-05-27 ENCOUNTER — Inpatient Hospital Stay: Payer: Medicare Other

## 2022-06-01 ENCOUNTER — Ambulatory Visit: Payer: Medicare Other | Attending: Cardiology

## 2022-06-01 DIAGNOSIS — Z5181 Encounter for therapeutic drug level monitoring: Secondary | ICD-10-CM

## 2022-06-01 DIAGNOSIS — I480 Paroxysmal atrial fibrillation: Secondary | ICD-10-CM

## 2022-06-01 LAB — POCT INR: INR: 3.5 — AB (ref 2.0–3.0)

## 2022-06-01 NOTE — Patient Instructions (Signed)
Description   Continue on same dosage of Warfarin 1/2 tablet every day except 1 tablet on Mondays, Wednesdays, and Fridays.  Recheck INR in 6 weeks.  [facility]in [facility] # [phone].

## 2022-06-14 ENCOUNTER — Ambulatory Visit (INDEPENDENT_AMBULATORY_CARE_PROVIDER_SITE_OTHER): Payer: Medicare Other | Admitting: Nurse Practitioner

## 2022-06-14 ENCOUNTER — Encounter: Payer: Self-pay | Admitting: Nurse Practitioner

## 2022-06-14 VITALS — BP 134/82 | HR 64 | Ht 64.0 in | Wt 249.2 lb

## 2022-06-14 DIAGNOSIS — J9611 Chronic respiratory failure with hypoxia: Secondary | ICD-10-CM

## 2022-06-14 DIAGNOSIS — G4719 Other hypersomnia: Secondary | ICD-10-CM

## 2022-06-14 DIAGNOSIS — J45909 Unspecified asthma, uncomplicated: Secondary | ICD-10-CM | POA: Diagnosis not present

## 2022-06-14 DIAGNOSIS — I272 Pulmonary hypertension, unspecified: Secondary | ICD-10-CM | POA: Diagnosis not present

## 2022-06-14 NOTE — Assessment & Plan Note (Signed)
No evidence of OSA in 2018. She has daytime fatigue, which improved with correction of her anemia. Previous split night study was ordered but never completed. She would still like to move forward with repeat workup. We will contact PCCs today for scheduling.

## 2022-06-14 NOTE — Patient Instructions (Signed)
Continue Albuterol inhaler 2 puffs every 6 hours as needed for shortness of breath or wheezing. Notify if symptoms persist despite rescue inhaler/neb use.  Continue Anoro 1 puff daily Continue cetirizine 1 tab daily Continue montelukast (singulair) 1 tab daily Continue furosemide (lasix) 80 mg daily. Monitor weights. Notify if you have 2-3 lb overnight or 5 lb in a week Continue supplemental oxygen 3-5 lpm for goal >88-90%  In lab sleep study ordered for further evaluation  Follow up in 8-10 weeks with Dr. Silas Flood to discuss sleep study results. If symptoms worsen, please contact office for sooner follow up or seek emergency care.

## 2022-06-14 NOTE — Assessment & Plan Note (Addendum)
Multifactorial. Stable without increased O2 requirement. She has had improvement in her respiratory status with correction of her anemia. Continue 3-5 lpm supplemental O2. Goal >88-90%  Patient Instructions  Continue Albuterol inhaler 2 puffs every 6 hours as needed for shortness of breath or wheezing. Notify if symptoms persist despite rescue inhaler/neb use.  Continue Anoro 1 puff daily Continue cetirizine 1 tab daily Continue montelukast (singulair) 1 tab daily Continue furosemide (lasix) 80 mg daily. Monitor weights. Notify if you have 2-3 lb overnight or 5 lb in a week Continue supplemental oxygen 3-5 lpm for goal >88-90%  In lab sleep study ordered for further evaluation  Follow up in 8-10 weeks with Dr. Silas Flood to discuss sleep study results. If symptoms worsen, please contact office for sooner follow up or seek emergency care.

## 2022-06-14 NOTE — Assessment & Plan Note (Signed)
Severe PAH with elevated wedge pressures. Maintained on diuretic regimen. Euvolemic on exam today. Follow up with [name] as scheduled.
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
## 2022-06-14 NOTE — Assessment & Plan Note (Addendum)
Compensated on current regimen. She has been on Symbicort in the past then [facility] and now on Anoro. Receives good benefit from use. Continue singulair for trigger prevention. Action plan in place.

## 2022-06-17 ENCOUNTER — Encounter (HOSPITAL_BASED_OUTPATIENT_CLINIC_OR_DEPARTMENT_OTHER): Payer: Medicare Other | Admitting: Pulmonary Disease

## 2022-06-22 ENCOUNTER — Inpatient Hospital Stay: Payer: Medicare Other | Attending: Oncology

## 2022-06-22 DIAGNOSIS — D589 Hereditary hemolytic anemia, unspecified: Secondary | ICD-10-CM | POA: Insufficient documentation

## 2022-06-22 DIAGNOSIS — D599 Acquired hemolytic anemia, unspecified: Secondary | ICD-10-CM

## 2022-06-22 LAB — CBC WITH DIFFERENTIAL (CANCER CENTER ONLY)
Abs Immature Granulocytes: 0.02 10*3/uL (ref 0.00–0.07)
Basophils Absolute: 0.1 10*3/uL (ref 0.0–0.1)
Basophils Relative: 1 %
Eosinophils Absolute: 0.3 10*3/uL (ref 0.0–0.5)
Eosinophils Relative: 4 %
HCT: 39.3 % (ref 36.0–46.0)
Hemoglobin: 11.8 g/dL — ABNORMAL LOW (ref 12.0–15.0)
Immature Granulocytes: 0 %
Lymphocytes Relative: 12 %
Lymphs Abs: 0.9 10*3/uL (ref 0.7–4.0)
MCH: 28.1 pg (ref 26.0–34.0)
MCHC: 30 g/dL (ref 30.0–36.0)
MCV: 93.6 fL (ref 80.0–100.0)
Monocytes Absolute: 0.7 10*3/uL (ref 0.1–1.0)
Monocytes Relative: 10 %
Neutro Abs: 5.1 10*3/uL (ref 1.7–7.7)
Neutrophils Relative %: 73 %
Platelet Count: 223 10*3/uL (ref 150–400)
RBC: 4.2 MIL/uL (ref 3.87–5.11)
RDW: 20.4 % — ABNORMAL HIGH (ref 11.5–15.5)
WBC Count: 7 10*3/uL (ref 4.0–10.5)
nRBC: 0 % (ref 0.0–0.2)

## 2022-06-23 ENCOUNTER — Telehealth: Payer: Self-pay

## 2022-06-23 NOTE — Progress Notes (Deleted)
[place] HER RETACRIT SHOT FOR 1/25 HER HEMOGLOBIN IS 11.8 TODAY. LEFT A MESSAGE FOR PATIENT TO CALL ME BACK TO CONFIRM SHE GOT THE MESSAGE.

## 2022-06-23 NOTE — Telephone Encounter (Signed)
CALLED PATIENT TO INFORM HER OF HER HEMOGLOBIN AT 11.8. WE WILL HOLD HER RETACRIT SHOT FOR 06/24/22. LEFT A MESSAGE VIA PHONE AND SENT PATIENT A TEXT ALSO.

## 2022-06-24 ENCOUNTER — Inpatient Hospital Stay: Payer: Medicare Other

## 2022-07-13 ENCOUNTER — Ambulatory Visit: Payer: Medicare Other

## 2022-07-20 ENCOUNTER — Ambulatory Visit: Payer: Medicare Other | Attending: Interventional Cardiology

## 2022-07-20 ENCOUNTER — Inpatient Hospital Stay: Payer: Medicare Other | Attending: Oncology

## 2022-07-20 DIAGNOSIS — D599 Acquired hemolytic anemia, unspecified: Secondary | ICD-10-CM | POA: Diagnosis present

## 2022-07-20 DIAGNOSIS — I480 Paroxysmal atrial fibrillation: Secondary | ICD-10-CM | POA: Diagnosis present

## 2022-07-20 DIAGNOSIS — Z5181 Encounter for therapeutic drug level monitoring: Secondary | ICD-10-CM

## 2022-07-20 LAB — CBC WITH DIFFERENTIAL (CANCER CENTER ONLY)
Abs Immature Granulocytes: 0.02 10*3/uL (ref 0.00–0.07)
Basophils Absolute: 0.1 10*3/uL (ref 0.0–0.1)
Basophils Relative: 1 %
Eosinophils Absolute: 0.2 10*3/uL (ref 0.0–0.5)
Eosinophils Relative: 3 %
HCT: 41.6 % (ref 36.0–46.0)
Hemoglobin: 13 g/dL (ref 12.0–15.0)
Immature Granulocytes: 0 %
Lymphocytes Relative: 13 %
Lymphs Abs: 1 10*3/uL (ref 0.7–4.0)
MCH: 30.2 pg (ref 26.0–34.0)
MCHC: 31.3 g/dL (ref 30.0–36.0)
MCV: 96.7 fL (ref 80.0–100.0)
Monocytes Absolute: 0.8 10*3/uL (ref 0.1–1.0)
Monocytes Relative: 11 %
Neutro Abs: 5.5 10*3/uL (ref 1.7–7.7)
Neutrophils Relative %: 72 %
Platelet Count: 226 10*3/uL (ref 150–400)
RBC: 4.3 MIL/uL (ref 3.87–5.11)
RDW: 18 % — ABNORMAL HIGH (ref 11.5–15.5)
WBC Count: 7.5 10*3/uL (ref 4.0–10.5)
nRBC: 0 % (ref 0.0–0.2)

## 2022-07-20 LAB — POCT INR: INR: 4.1 — AB (ref 2.0–3.0)

## 2022-07-20 NOTE — Patient Instructions (Signed)
Description   HOLD today's dose and then continue on same dosage of Warfarin 1/2 tablet every day except 1 tablet on Mondays, Wednesdays, and Fridays.  Recheck INR in 4 weeks.  Coumadin Clinic # [phone].

## 2022-07-21 ENCOUNTER — Telehealth: Payer: Self-pay | Admitting: Oncology

## 2022-07-21 NOTE — Telephone Encounter (Signed)
07/21/22 LVM-No injection needed on 07/22/22

## 2022-07-22 ENCOUNTER — Ambulatory Visit: Payer: Medicare Other

## 2022-07-27 IMAGING — DX DG CHEST 1V
1 series · 1 of 1 positions shown · non-contrast
Comparison: Chest radiograph 05/12/2021

CLINICAL DATA: Hypoxia, shortness of breath

EXAM:
CHEST  1 VIEW

[chest ap]
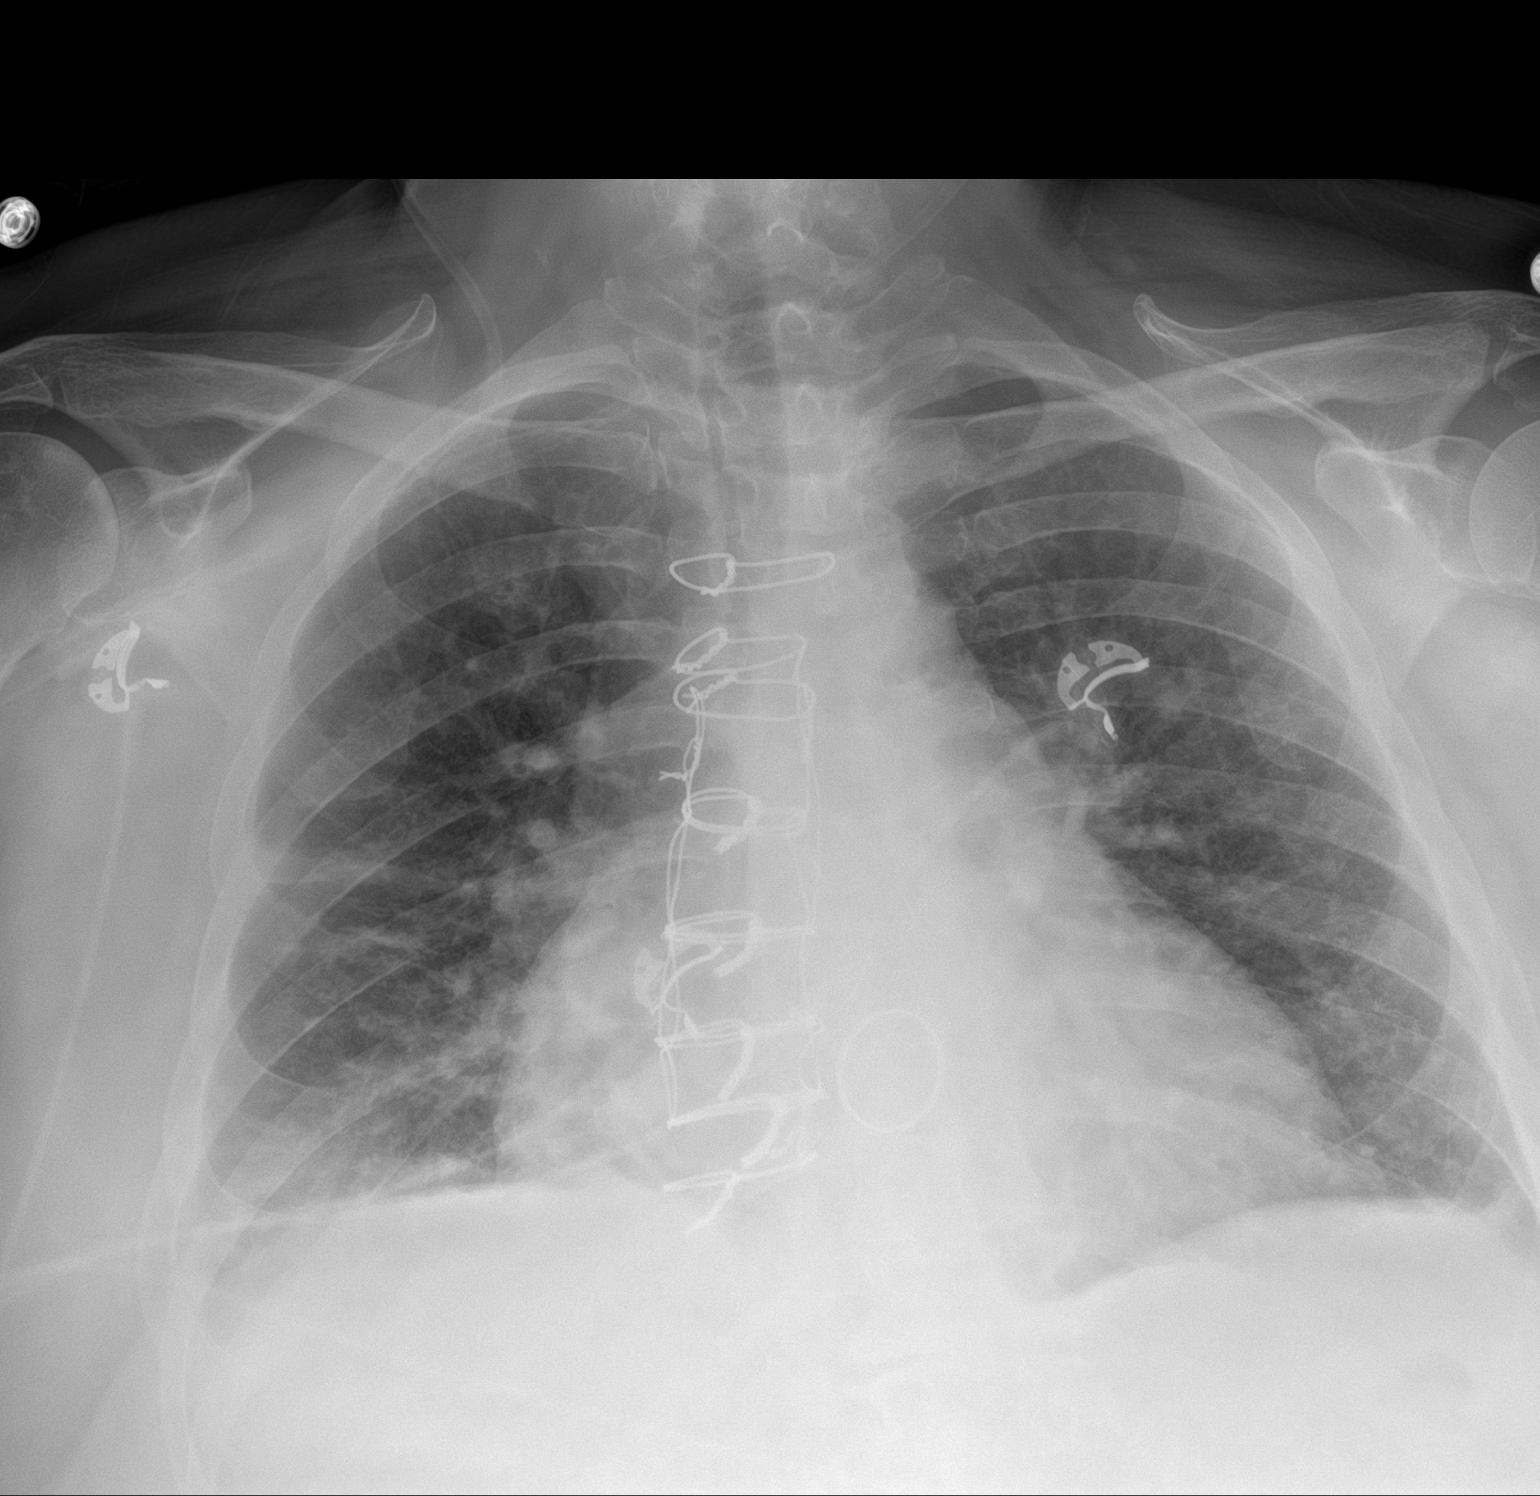

[1 of 1 positions shown; findings below may reference images not displayed]

FINDINGS: Median sternotomy wires and cardiac valve prostheses are again
noted. The heart is enlarged, unchanged. The mediastinal contours
are stable.

There is vascular congestion without definite overt pulmonary edema.
There is a small right pleural effusion with adjacent opacity,
slightly improved in the interim. Blunting of the left costophrenic
angle is unchanged, likely reflecting scarring. There is no
pneumothorax.

The bones are stable.
IMPRESSION: Small right pleural effusion with adjacent right basilar opacity,
slightly improved since 05/12/2021. Otherwise, no significant
interval change in lung aeration.

## 2022-07-29 IMAGING — CT CT CHEST HIGH RESOLUTION
2 of 6 series · 13 of 36 positions shown, 16 images · non-contrast
Comparison: None.
COMPARISON: None.

Addendum:
CLINICAL DATA: Interstitial lung disease

EXAM:
CT CHEST WITHOUT CONTRAST
TECHNIQUE: Multidetector CT imaging of the chest was performed following the
standard protocol without intravenous contrast. High resolution
imaging of the lungs, as well as inspiratory and expiratory imaging,
was performed.

[Series 9: chest w/o bone thins · axial · non-contrast · 0.72mm/px · z∈[-538,-282]mm · 10 of 294 slices shown, 13 images]
[im 19/294  mediastinal]
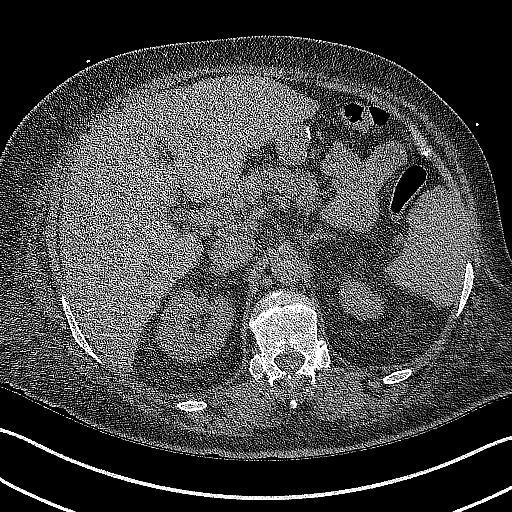
[im 19/294  lung]
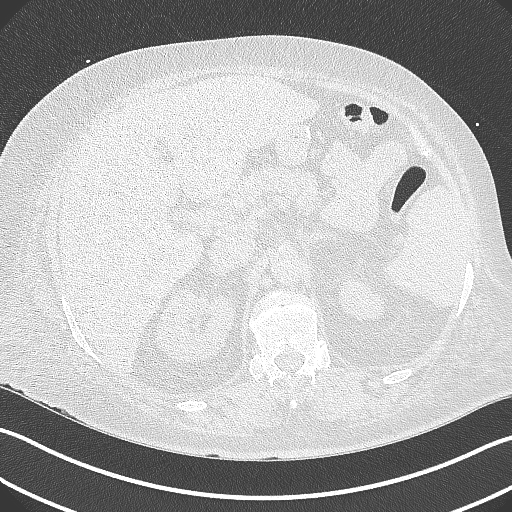
[im 55/294  lung]
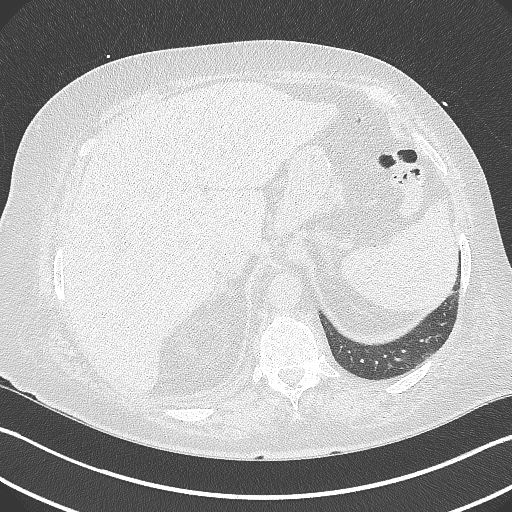
[im 74/294  lung]
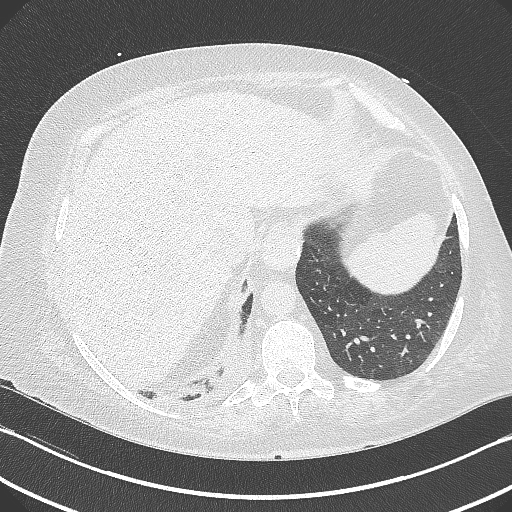
[im 110/294  lung]
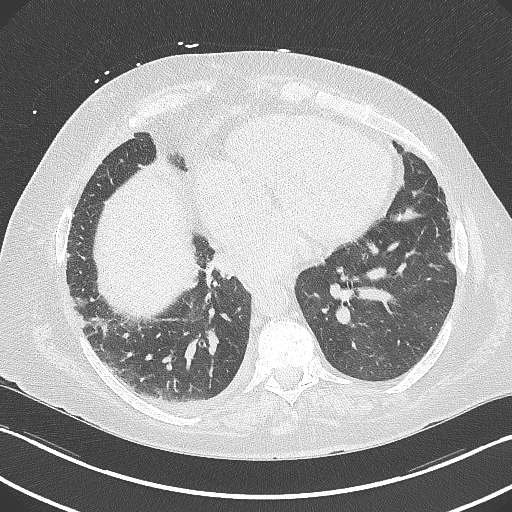
[im 129/294  mediastinal]
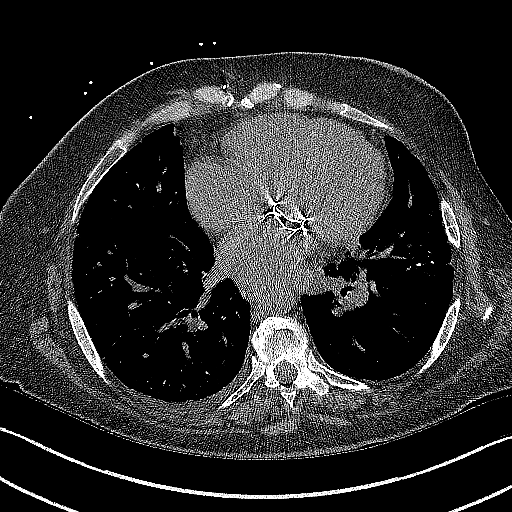
[im 129/294  lung]
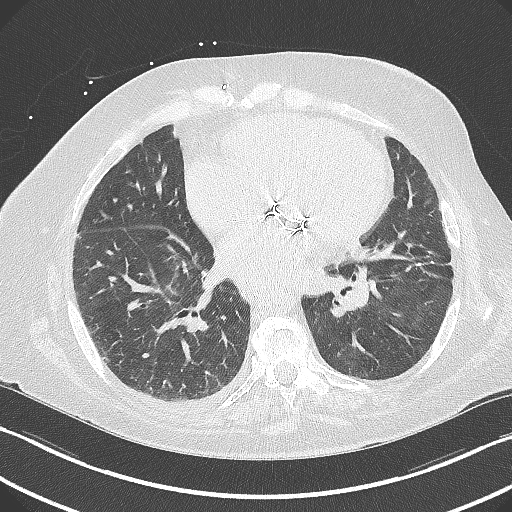
[im 165/294  lung]
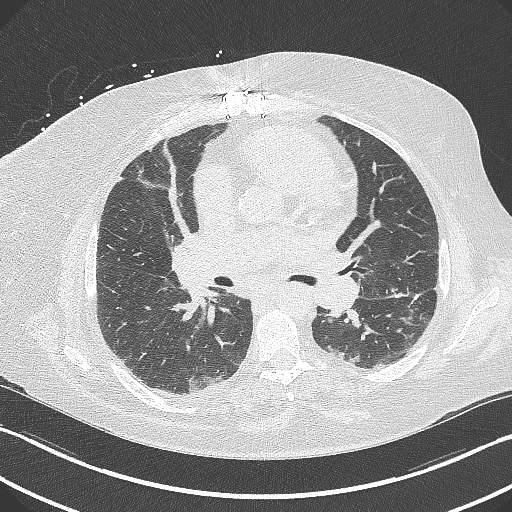
[im 184/294  lung]
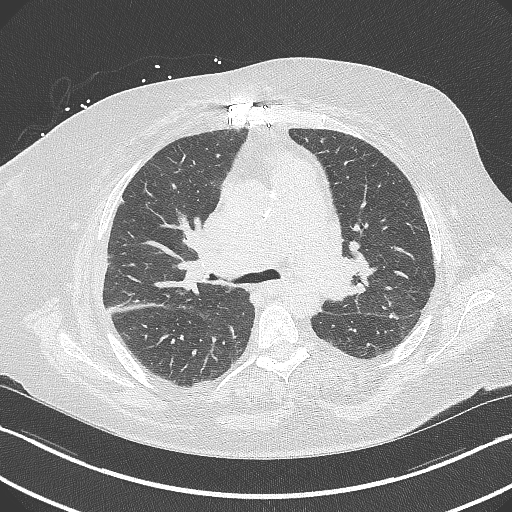
[im 220/294  lung]
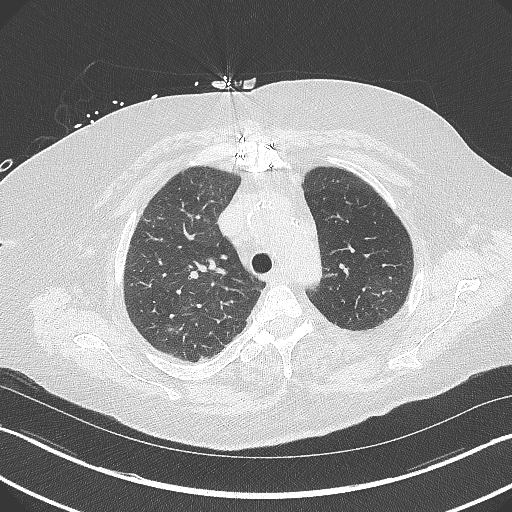
[im 239/294  mediastinal]
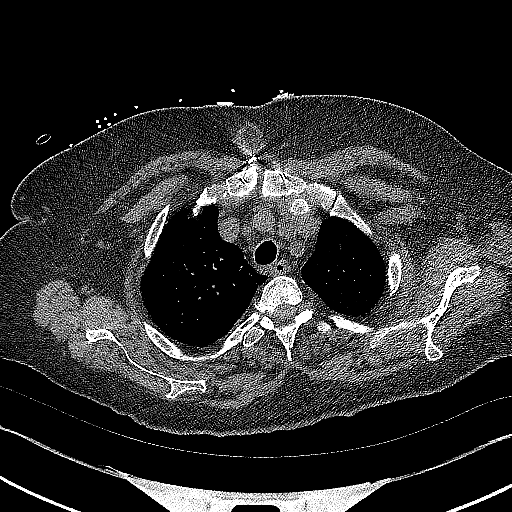
[im 239/294  lung]
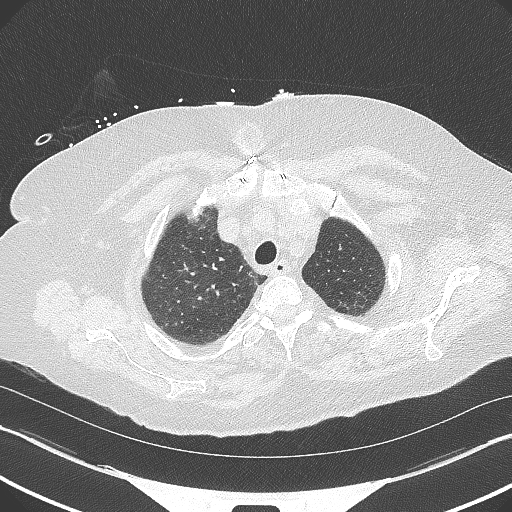
[im 275/294  lung]
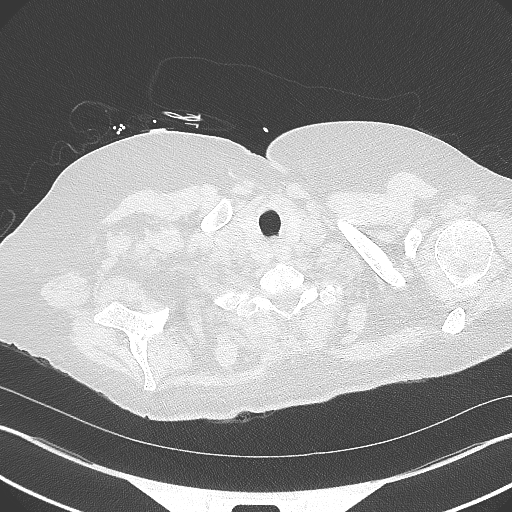

[Series 11: chest w/o 2mm st cor · coronal · non-contrast · 0.59mm/px · 3 of 151 slices shown]
[im 31/151  lung]
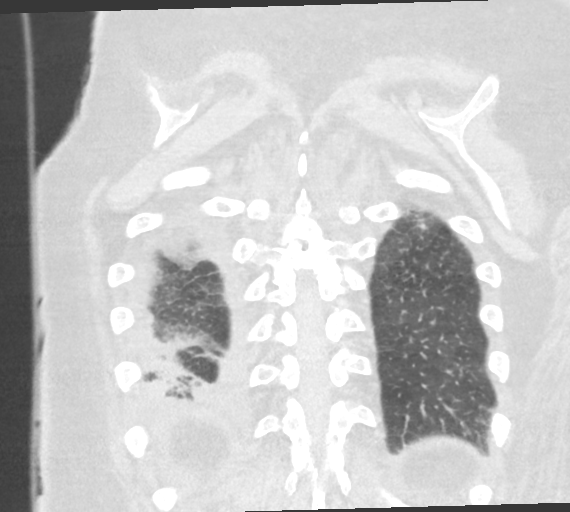
[im 61/151  lung]
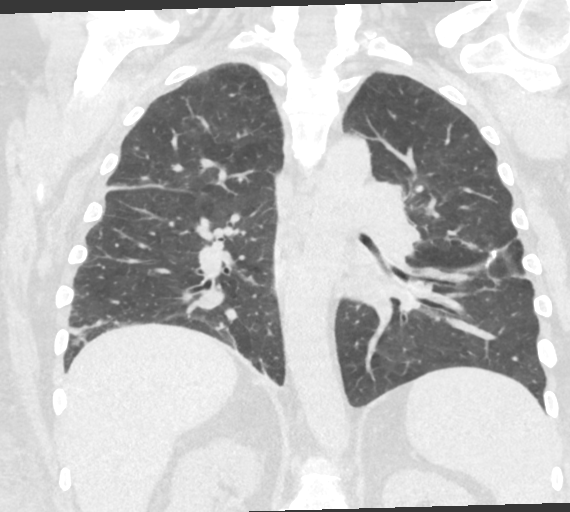
[im 91/151  lung]
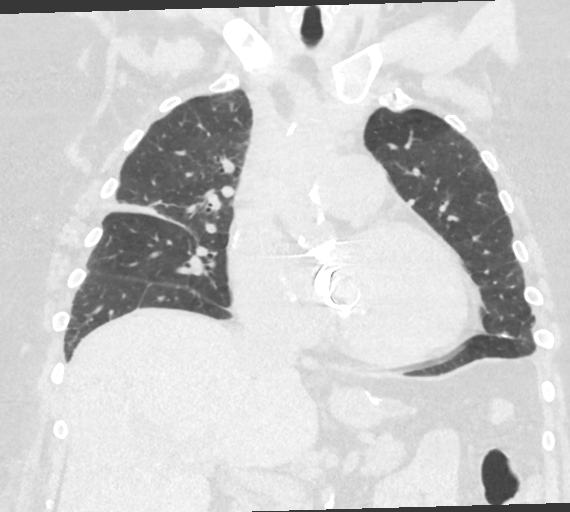

[13 of 36 positions shown; findings below may reference images not displayed]

FINDINGS: Cardiovascular: Cardiomegaly. No pericardial effusion. Coronary
artery calcifications of the LAD and RCA. Prior aortic and mitral
valve replacement atherosclerotic disease of the thoracic aorta.
Dilated main pulmonary artery.

Mediastinum/Nodes: Small hiatal hernia. Enlarged mediastinal lymph
nodes. Reference subcarinal lymph node measuring 1.6 cm in short
axis.

Lungs/Pleura: Central airways are patent. Bilateral mosaic
attenuation which does not appear to accentuate with expiration.
Linear opacity of the right upper lobe and lingula, likely due to
scarring or atelectasis. Trace right pleural effusion with adjacent
subpleural opacities of the right lower. Bilateral solid pulmonary
nodules largest is located in the right upper lobe measures 4 mm on
series 4, image 78. No evidence traction bronchiectasis or honeycomb
change.

Upper Abdomen: Postsurgical changes of the stomach. No acute
abnormality.

Musculoskeletal: No chest wall mass or suspicious bone lesions
identified.
IMPRESSION: 1. Trace right pleural effusion. Mild subpleural opacities of the
right lower lobe adjacent to the pleural effusion are favored to
represent atelectasis, although interstitial lung abnormality cannot
be excluded. Prone imaging could be helpful for further evaluation.
2. Bilateral mosaic attenuation, findings can be seen in the setting
of pulmonary hypertension.
3. Bilateral solid pulmonary nodules, largest measures 4 mm and is
located in the right upper lobe. No follow-up needed if patient is
low-risk. Non-contrast chest CT can be considered in 12 months if
patient is high-risk. This recommendation follows the consensus
statement: Guidelines for Management of Incidental Pulmonary Nodules
Detected on CT Images: From the [HOSPITAL] 4327; Radiology
4. Coronary artery calcifications and aortic Atherosclerosis
(3GJEO-8IU.U).

ADDENDUM:
Corrected report: Original report was generated with an error,
impression point omitted in error and is as follows:

Enlarged mediastinal lymph nodes which are possibly reactive, but
greater than expected in size. Recommend follow-up chest CT in 3
months to ensure stability.

These results will be called to the ordering clinician or
representative by the Radiologist Assistant, and communication
documented in the PACS or [REDACTED].

*** End of Addendum ***
FINDINGS: Cardiovascular: Cardiomegaly. No pericardial effusion. Coronary
artery calcifications of the LAD and RCA. Prior aortic and mitral
valve replacement atherosclerotic disease of the thoracic aorta.
Dilated main pulmonary artery.

Mediastinum/Nodes: Small hiatal hernia. Enlarged mediastinal lymph
nodes. Reference subcarinal lymph node measuring 1.6 cm in short
axis.

Lungs/Pleura: Central airways are patent. Bilateral mosaic
attenuation which does not appear to accentuate with expiration.
Linear opacity of the right upper lobe and lingula, likely due to
scarring or atelectasis. Trace right pleural effusion with adjacent
subpleural opacities of the right lower. Bilateral solid pulmonary
nodules largest is located in the right upper lobe measures 4 mm on
series 4, image 78. No evidence traction bronchiectasis or honeycomb
change.

Upper Abdomen: Postsurgical changes of the stomach. No acute
abnormality.

Musculoskeletal: No chest wall mass or suspicious bone lesions
identified.
IMPRESSION: 1. Trace right pleural effusion. Mild subpleural opacities of the
right lower lobe adjacent to the pleural effusion are favored to
represent atelectasis, although interstitial lung abnormality cannot
be excluded. Prone imaging could be helpful for further evaluation.
2. Bilateral mosaic attenuation, findings can be seen in the setting
of pulmonary hypertension.
3. Bilateral solid pulmonary nodules, largest measures 4 mm and is
located in the right upper lobe. No follow-up needed if patient is
low-risk. Non-contrast chest CT can be considered in 12 months if
patient is high-risk. This recommendation follows the consensus
statement: Guidelines for Management of Incidental Pulmonary Nodules
Detected on CT Images: From the [HOSPITAL] 4327; Radiology
4. Coronary artery calcifications and aortic Atherosclerosis
(3GJEO-8IU.U).

## 2022-08-10 ENCOUNTER — Ambulatory Visit: Payer: Medicare Other | Attending: Physician Assistant | Admitting: Physician Assistant

## 2022-08-10 ENCOUNTER — Encounter: Payer: Self-pay | Admitting: Physician Assistant

## 2022-08-10 VITALS — BP 134/62 | HR 62 | Ht 65.0 in | Wt 253.2 lb

## 2022-08-10 DIAGNOSIS — I272 Pulmonary hypertension, unspecified: Secondary | ICD-10-CM

## 2022-08-10 DIAGNOSIS — D599 Acquired hemolytic anemia, unspecified: Secondary | ICD-10-CM | POA: Insufficient documentation

## 2022-08-10 DIAGNOSIS — I5032 Chronic diastolic (congestive) heart failure: Secondary | ICD-10-CM

## 2022-08-10 DIAGNOSIS — J9611 Chronic respiratory failure with hypoxia: Secondary | ICD-10-CM | POA: Diagnosis present

## 2022-08-10 DIAGNOSIS — Z952 Presence of prosthetic heart valve: Secondary | ICD-10-CM | POA: Diagnosis present

## 2022-08-10 DIAGNOSIS — I480 Paroxysmal atrial fibrillation: Secondary | ICD-10-CM | POA: Insufficient documentation

## 2022-08-10 DIAGNOSIS — I38 Endocarditis, valve unspecified: Secondary | ICD-10-CM | POA: Diagnosis present

## 2022-08-10 DIAGNOSIS — N1832 Chronic kidney disease, stage 3b: Secondary | ICD-10-CM | POA: Diagnosis present

## 2022-08-10 NOTE — Assessment & Plan Note (Signed)
History of mechanical AVR and mechanical MVR by [name] at [facility] in March 2016.  Echocardiogram in December 2022 with normal EF and normally functioning mitral valve and aortic valve prostheses.  She has been evaluated by [name] again in the setting of acquired hemolytic anemia.  He did not feel that she would be a candidate for redo valve surgery and has recommended conservative approach.  Overall, she seems to be fairly stable at this time.  Continue follow-up with Coumadin clinic for management of her warfarin.  Continue SBE prophylaxis.  I will have her follow-up with Dr. Glenford Bayley in light of Dr. Thompson Caul retirement.  Return in 6 months with a follow-up echocardiogram prior to this visit.

## 2022-08-10 NOTE — Assessment & Plan Note (Signed)
She follows with [place] [facility].  Her last creatinine was 1.27 as outlined above.

## 2022-08-10 NOTE — Assessment & Plan Note (Signed)
According to [facility], her most recent hemoglobin on 07/20/2022 was normal at 13.  She is followed by the cancer center in [place].

## 2022-08-10 NOTE — Assessment & Plan Note (Signed)
Overall, volume status appears to be stable.  Continue furosemide 80 mg twice daily.  Labs according to [facility] were reviewed today.  Creatinine on 04/26/2022 was normal at 1.27.

## 2022-08-10 NOTE — Patient Instructions (Signed)
Medication Instructions:  Your physician recommends that you continue on your current medications as directed. Please refer to the Current Medication list given to you today.  *If you need a refill on your cardiac medications before your next appointment, please call your pharmacy*   Lab Work: None ordered  If you have labs (blood work) drawn today and your tests are completely normal, you will receive your results only by: [place] (if you have MyChart) OR A paper copy in the mail If you have any lab test that is abnormal or we need to change your treatment, we will call you to review the results.   Testing/Procedures: Your physician has requested that you have an echocardiogram IN 6 MONTHS AND SEE DR. CHANDRASEKHAR 1 WEEK AFTER.  Echocardiography is a painless test that uses sound waves to create images of your heart. It provides your doctor with information about the size and shape of your heart and how well your heart's chambers and valves are working. This procedure takes approximately one hour. There are no restrictions for this procedure. Please do NOT wear cologne, perfume, aftershave, or lotions (deodorant is allowed). Please arrive 15 minutes prior to your appointment time.    Follow-Up: At [facility], you and your health needs are our priority.  As part of our continuing mission to provide you with exceptional heart care, we have created designated Provider Care Teams.  These Care Teams include your primary Cardiologist (physician) and Advanced Practice Providers (APPs -  Physician Assistants and Nurse Practitioners) who all work together to provide you with the care you need, when you need it.  We recommend signing up for the patient portal called "MyChart".  Sign up information is provided on this After Visit Summary.  MyChart is used to connect with patients for Virtual Visits (Telemedicine).  Patients are able to view lab/test results, encounter notes,  upcoming appointments, etc.  Non-urgent messages can be sent to your provider as well.   To learn more about what you can do with MyChart, go to [email or web address].    Your next appointment:   6 month(s)  Provider:   Rudean Haskell, MD 1 WEEK AFTER ECHO     Other Instructions

## 2022-08-10 NOTE — Progress Notes (Signed)
Cardiology Office Note:    Date:  08/10/2022  ID:  [name], DOB 09-May-1961, MRN [id number] [place] Providers Cardiologist:  Sinclair Grooms, MD (Inactive)      Patient Profile:   [name] ([name], [place]) in 07/2014 No CAD on cath prior to surgery TTE 05/04/2021: EF 55-60, no RWMA, normal RVSF, severely elevated PASP, moderate LAE, mechanical Young with mean gradient 6, moderate TR, mechanical AVR with mean gradient 15, RAP 8 Chronic hypoxic respiratory failure Pulmonary hypertension  RHC 05/14/2021: mean RA 18, RV 91/9, mean PAP 60, mean PCWP 24, CO 7, CI 3.2 Intrinsic asthma 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 female returns for f/u on Diana heart disease, CHF, AFib. She was last seen by [name] in July 2023.  She is here with her twin sister.  Overall, she has been stable.  Her breathing is stable.  She uses O2 continuously.  Her pulmonologist is setting her up for a sleep study.  She has not had chest pain, syncope.  She has lymphedema in her right leg.  This is chronic without change.  ROS - see HPI    Studies Reviewed:    EKG: Not done  Risk Assessment/Calculations:    CHA2DS2-VASc Score = 3   This indicates a 3.2% annual risk of stroke. The patient's score is based upon: CHF History: 1 HTN History: 1 Diabetes History: 0 Stroke History: 0 Vascular Disease History: 0 Age Score: 0 Gender Score: 1            Physical Exam:   VS:  BP 134/62   Pulse 62   Ht '5\' 5"'$  (1.651 m)   Wt  253 lb 3.2 oz (114.9 kg)   SpO2 99%   BMI 42.13 kg/m    Wt Readings from Last 3 Encounters:  08/10/22 253 lb 3.2 oz (114.9 kg)  06/14/22 249 lb 3.2 oz (113 kg)  04/26/22 248 lb 3.2 oz (112.6 kg)    Constitutional:      Appearance: Healthy appearance. Not in distress.  Pulmonary:     Effort: Pulmonary effort is normal.     Breath sounds: No rales.  Cardiovascular:     Normal rate. Regular rhythm. S1. Mechanical S2. Mechanical   Edema:    Peripheral edema present.    Pretibial: 3+ edema of the right pretibial area.    Ankle: 3+ edema of the right ankle. Abdominal:     Palpations: Abdomen is soft.       ASSESSMENT AND PLAN:   Diana heart disease History of mechanical AVR and mechanical Young by [name] at [facility] in March 2016.  Echocardiogram in December 2022 with normal EF and normally functioning mitral valve and aortic valve prostheses.  She has been evaluated by [name] again in the setting of acquired hemolytic anemia.  He did not feel that she would be a candidate for redo valve surgery and has recommended conservative approach.  Overall, she seems to be fairly stable at this time.  Continue follow-up with Coumadin clinic  for management of her warfarin.  Continue SBE prophylaxis.  I will have her follow-up with Dr. Glenford Bayley in light of Dr. Thompson Caul retirement.  Return in 6 months with a follow-up echocardiogram prior to this visit.  PAF (paroxysmal atrial fibrillation) (HCC) Maintaining sinus rhythm by exam today.  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  Continue metoprolol tartrate 50 mg daily.  Continue warfarin as directed by the Coumadin clinic.  (HFpEF) heart failure with preserved ejection fraction (HCC) Overall, volume status appears to be stable.  Continue furosemide 80 mg twice daily.  Labs according to [facility] were reviewed today.  Creatinine on 04/26/2022 was normal at 1.27.  Pulmonary  HTN ([place]) Secondary to lung disease.  Continue follow-up with pulmonology as planned.  Stage 3 chronic kidney disease ([place]) She follows with [place] [facility].  Her last creatinine was 1.27 as outlined above.  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HCC) According to [facility], her most recent hemoglobin on 07/20/2022 was normal at 13.  She is followed by the cancer center in [place].  Chronic respiratory failure ([place]) She remains on continuous O2.  Follow-up with pulmonology as planned.       Dispo:  Return in about 6 months (around 02/10/2023) for Routine Follow Up with Dr. Glenford Bayley.  Signed, Richardson Dopp, PA-C

## 2022-08-10 NOTE — Assessment & Plan Note (Signed)
Secondary to lung disease.  Continue follow-up with pulmonology as planned.

## 2022-08-10 NOTE — Assessment & Plan Note (Signed)
Maintaining sinus rhythm by exam today.  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  Continue metoprolol tartrate 50 mg daily.  Continue warfarin as directed by the Coumadin clinic.

## 2022-08-10 NOTE — Assessment & Plan Note (Signed)
She remains on continuous O2.  Follow-up with pulmonology as planned.

## 2022-08-17 ENCOUNTER — Ambulatory Visit: Payer: Medicare Other

## 2022-08-24 ENCOUNTER — Ambulatory Visit: Payer: Medicare Other | Attending: Cardiology

## 2022-08-24 ENCOUNTER — Inpatient Hospital Stay: Payer: Medicare Other | Admitting: Oncology

## 2022-08-24 ENCOUNTER — Inpatient Hospital Stay: Payer: Medicare Other

## 2022-08-24 DIAGNOSIS — Z5181 Encounter for therapeutic drug level monitoring: Secondary | ICD-10-CM | POA: Insufficient documentation

## 2022-08-24 DIAGNOSIS — I480 Paroxysmal atrial fibrillation: Secondary | ICD-10-CM | POA: Diagnosis present

## 2022-08-24 LAB — POCT INR: INR: 4 — AB (ref 2.0–3.0)

## 2022-08-24 NOTE — Patient Instructions (Signed)
Description   HOLD today's dose and then START taking Warfarin 1/2 tablet every day except 1 tablet on Wednesdays, and Fridays.  Recheck INR in 2 weeks.  Coumadin Clinic # [phone].

## 2022-08-25 ENCOUNTER — Encounter: Payer: Self-pay | Admitting: Oncology

## 2022-08-26 ENCOUNTER — Ambulatory Visit: Payer: Medicare Other

## 2022-09-02 ENCOUNTER — Encounter (HOSPITAL_COMMUNITY): Payer: Self-pay | Admitting: *Deleted

## 2022-09-07 ENCOUNTER — Other Ambulatory Visit: Payer: Self-pay | Admitting: Oncology

## 2022-09-07 ENCOUNTER — Inpatient Hospital Stay (INDEPENDENT_AMBULATORY_CARE_PROVIDER_SITE_OTHER): Payer: Medicare Other | Admitting: Oncology

## 2022-09-07 ENCOUNTER — Ambulatory Visit: Payer: Medicare Other | Attending: Cardiology

## 2022-09-07 ENCOUNTER — Inpatient Hospital Stay: Payer: Medicare Other | Attending: Oncology

## 2022-09-07 VITALS — BP 155/71 | HR 66 | Temp 97.7°F | Resp 16 | Ht 65.0 in | Wt 255.4 lb

## 2022-09-07 DIAGNOSIS — D598 Other acquired hemolytic anemias: Secondary | ICD-10-CM | POA: Diagnosis not present

## 2022-09-07 DIAGNOSIS — I480 Paroxysmal atrial fibrillation: Secondary | ICD-10-CM | POA: Insufficient documentation

## 2022-09-07 DIAGNOSIS — Z5181 Encounter for therapeutic drug level monitoring: Secondary | ICD-10-CM | POA: Insufficient documentation

## 2022-09-07 DIAGNOSIS — C7951 Secondary malignant neoplasm of bone: Secondary | ICD-10-CM

## 2022-09-07 LAB — CBC AND DIFFERENTIAL
HCT: 42 (ref 36–46)
Hemoglobin: 14.3 (ref 12.0–16.0)
Neutrophils Absolute: 6.47
Platelets: 166 10*3/uL (ref 150–400)
WBC: 8.4

## 2022-09-07 LAB — CBC: RBC: 4.47 (ref 3.87–5.11)

## 2022-09-07 LAB — POCT INR: INR: 4.1 — AB (ref 2.0–3.0)

## 2022-09-07 NOTE — Progress Notes (Signed)
[facility]  [address] [place],  [place]  [address] [phone]  Clinic Day:  09/07/2022  Referring physician: Nathaneil Canary, PA-C  HISTORY OF PRESENT ILLNESS:  The patient is a 62 y.o. female with hemolytic anemia.  This is based upon her having an elevated LDH and undetectable haptoglobin level.  Of note, she has 2 mechanical valves which are believed to be the culprits behind her hemolytic process.  Initially, the patient was placed on monthly Retacrit injections to get her hemoglobin at least above 10.  As her hemoglobin has improved over time, her Retacrit injections have been held.  She comes to the clinic today doing very well.  She denies having increased fatigue or other symptoms which concern her for recurrent anemia.  PHYSICAL EXAM:  Blood pressure (!) 155/71, pulse 66, temperature 97.7 F (36.5 C), resp. rate 16, height 5\' 5"  (1.651 m), weight 255 lb 6.4 oz (115.8 kg), SpO2 92 %. Wt Readings from Last 3 Encounters:  09/07/22 255 lb 6.4 oz (115.8 kg)  08/10/22 253 lb 3.2 oz (114.9 kg)  06/14/22 249 lb 3.2 oz (113 kg)   Body mass index is 42.5 kg/m. Performance status (ECOG): 2 - Symptomatic, <50% confined to bed Physical Exam Constitutional:      Appearance: Normal appearance. She is not ill-appearing.  HENT:     Mouth/Throat:     Mouth: Mucous membranes are moist.     Pharynx: Oropharynx is clear. No oropharyngeal exudate or posterior oropharyngeal erythema.  Cardiovascular:     Rate and Rhythm: Normal rate and regular rhythm.     Heart sounds: No murmur heard.    No friction rub. No gallop.  Pulmonary:     Effort: Pulmonary effort is normal. No respiratory distress.     Breath sounds: Normal breath sounds. No wheezing, rhonchi or rales.  Abdominal:     General: Bowel sounds are normal. There is no distension.     Palpations: Abdomen is soft. There is no mass.     Tenderness: There is no abdominal tenderness.   Musculoskeletal:        General: No swelling.     Right lower leg: Swelling present. No edema.     Left lower leg: No edema.  Lymphadenopathy:     Cervical: No cervical adenopathy.     Upper Body:     Right upper body: No supraclavicular or axillary adenopathy.     Left upper body: No supraclavicular or axillary adenopathy.     Lower Body: No right inguinal adenopathy. No left inguinal adenopathy.  Skin:    General: Skin is warm.     Coloration: Skin is not jaundiced.     Findings: No lesion or rash.  Neurological:     General: No focal deficit present.     Mental Status: She is alert and oriented to person, place, and time. Mental status is at baseline.  Psychiatric:        Mood and Affect: Mood normal.        Behavior: Behavior normal.        Thought Content: Thought content normal.     LABS:       Latest Ref Rng & Units 07/20/2022   11:22 AM 06/22/2022   11:45 AM 05/25/2022   11:43 AM  CBC  WBC 4.0 - 10.5 K/uL 7.5  7.0  7.0   Hemoglobin 12.0 - 15.0 g/dL 09.3  26.7  12.4   Hematocrit 36.0 -  46.0 % 41.6  39.3  41.5   Platelets 150 - 400 K/uL 226  223  223       Latest Ref Rng & Units 04/26/2022    3:42 PM 04/06/2022   12:05 PM 03/18/2022   12:00 AM  CMP  Glucose 70 - 99 mg/dL 84  90    BUN 8 - 23 mg/dL 29  31  23       Creatinine 0.44 - 1.00 mg/dL 6.19  5.09  1.1      Sodium 135 - 145 mmol/L 143  147  142      Potassium 3.5 - 5.1 mmol/L 4.1  4.4  4.1      Chloride 98 - 111 mmol/L 105  104  103      CO2 22 - 32 mmol/L 28  28  33      Calcium 8.9 - 10.3 mg/dL 9.7  32.6  9.5      Total Protein 6.5 - 8.1 g/dL 8.1  7.3    Total Bilirubin 0.3 - 1.2 mg/dL 0.8  0.9    Alkaline Phos 38 - 126 U/L 65  55  64      AST 15 - 41 U/L 21  23  29       ALT 0 - 44 U/L 11  12  14          This result is from an external source.    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  Her  hemoglobin of 14.3 is the highest hemoglobin I have ever seen with this patient.  This is with her not taking any Retacrit injections over these past months.  Understandably, the patient is pleased with how high her hemoglobin is.  Based upon this, her Retacrit injections will continue to be held.  I will see her back in 6 months for repeat clinical assessment.  If her hemoglobin remains this ideal at her next visit, I will consider turning her care back over to her other physicians.  The patient understands all the plans discussed today and is in agreement with them.  Carrick Rijos Kirby Funk, MD

## 2022-09-07 NOTE — Patient Instructions (Signed)
Description   HOLD today's dose and then START taking Warfarin 1/2 tablet every day except 1 tablet on Wednesdays.  Recheck INR in 1 week.  Coumadin Clinic # [phone].

## 2022-09-08 ENCOUNTER — Encounter: Payer: Self-pay | Admitting: Oncology

## 2022-09-09 ENCOUNTER — Ambulatory Visit: Payer: Medicare Other

## 2022-09-14 ENCOUNTER — Ambulatory Visit: Payer: Medicare Other

## 2022-09-28 ENCOUNTER — Ambulatory Visit: Payer: Medicare Other | Attending: Cardiology

## 2022-09-28 DIAGNOSIS — Z5181 Encounter for therapeutic drug level monitoring: Secondary | ICD-10-CM

## 2022-09-28 DIAGNOSIS — I480 Paroxysmal atrial fibrillation: Secondary | ICD-10-CM | POA: Diagnosis present

## 2022-09-28 LAB — POCT INR: INR: 3.6 — AB (ref 2.0–3.0)

## 2022-09-28 NOTE — Patient Instructions (Signed)
Description   HOLD today's dose and then continue taking Warfarin 1/2 tablet every day except 1 tablet on Wednesdays.  Recheck INR in 2 weeks.  Coumadin Clinic # [phone].

## 2022-10-12 ENCOUNTER — Ambulatory Visit: Payer: Medicare Other

## 2022-10-26 ENCOUNTER — Ambulatory Visit: Payer: Medicare Other | Attending: Cardiology

## 2022-10-26 DIAGNOSIS — Z5181 Encounter for therapeutic drug level monitoring: Secondary | ICD-10-CM | POA: Diagnosis present

## 2022-10-26 DIAGNOSIS — I480 Paroxysmal atrial fibrillation: Secondary | ICD-10-CM | POA: Insufficient documentation

## 2022-10-26 LAB — POCT INR: INR: 3.1 — AB (ref 2.0–3.0)

## 2022-10-26 NOTE — Patient Instructions (Signed)
Description   Continue taking Warfarin 1/2 tablet every day except 1 tablet on Wednesdays.  Recheck INR in 4 weeks.  Coumadin Clinic # [phone].

## 2022-11-23 ENCOUNTER — Ambulatory Visit: Payer: Medicare Other | Attending: Cardiology

## 2022-11-23 DIAGNOSIS — I480 Paroxysmal atrial fibrillation: Secondary | ICD-10-CM | POA: Insufficient documentation

## 2022-11-23 DIAGNOSIS — Z5181 Encounter for therapeutic drug level monitoring: Secondary | ICD-10-CM | POA: Insufficient documentation

## 2022-11-23 LAB — POCT INR: INR: 3 (ref 2.0–3.0)

## 2022-11-23 NOTE — Patient Instructions (Signed)
Description   Continue taking Warfarin 1/2 tablet every day except 1 tablet on Wednesdays.  Recheck INR in 5 weeks.  Coumadin Clinic # [phone].

## 2022-12-13 ENCOUNTER — Other Ambulatory Visit: Payer: Self-pay | Admitting: Pulmonary Disease

## 2022-12-13 ENCOUNTER — Other Ambulatory Visit: Payer: Self-pay

## 2022-12-13 MED ORDER — POTASSIUM CHLORIDE CRYS ER 20 MEQ PO TBCR: 20.0000 meq | EXTENDED_RELEASE_TABLET | Freq: Every day | ORAL | 2 refills | Status: DC

## 2022-12-28 ENCOUNTER — Ambulatory Visit: Payer: Medicare Other | Attending: Cardiology

## 2022-12-28 DIAGNOSIS — I480 Paroxysmal atrial fibrillation: Secondary | ICD-10-CM | POA: Diagnosis not present

## 2022-12-28 DIAGNOSIS — Z5181 Encounter for therapeutic drug level monitoring: Secondary | ICD-10-CM | POA: Insufficient documentation

## 2022-12-28 LAB — POCT INR: INR: 3.4 — AB (ref 2.0–3.0)

## 2022-12-28 MED ORDER — WARFARIN SODIUM 7.5 MG PO TABS
ORAL_TABLET | ORAL | 1 refills | Status: DC
Start: 2022-12-28 — End: 2023-05-02

## 2022-12-28 NOTE — Patient Instructions (Signed)
Description   Continue taking Warfarin 1/2 tablet every day except 1 tablet on Wednesdays.  Recheck INR in 6 weeks.  Coumadin Clinic # [phone].

## 2023-02-08 ENCOUNTER — Ambulatory Visit: Payer: Medicare Other

## 2023-02-08 DIAGNOSIS — Z5181 Encounter for therapeutic drug level monitoring: Secondary | ICD-10-CM | POA: Diagnosis not present

## 2023-02-08 DIAGNOSIS — I350 Nonrheumatic aortic (valve) stenosis: Secondary | ICD-10-CM | POA: Diagnosis present

## 2023-02-08 DIAGNOSIS — I480 Paroxysmal atrial fibrillation: Secondary | ICD-10-CM

## 2023-02-08 LAB — POCT INR: INR: 4.7 — AB (ref 2.0–3.0)

## 2023-02-08 NOTE — Patient Instructions (Signed)
Description   HOLD today's dose and then continue taking Warfarin 1/2 tablet every day except 1 tablet on Wednesdays.  Recheck INR in 3 weeks.  Coumadin Clinic # [phone].

## 2023-02-10 ENCOUNTER — Ambulatory Visit (HOSPITAL_COMMUNITY): Payer: Medicare Other | Attending: Physician Assistant

## 2023-02-10 DIAGNOSIS — Z952 Presence of prosthetic heart valve: Secondary | ICD-10-CM | POA: Insufficient documentation

## 2023-02-11 LAB — ECHOCARDIOGRAM COMPLETE
AR max vel: 1.26 cm2
AV Area VTI: 1.22 cm2
AV Area mean vel: 1.38 cm2
AV Mean grad: 15.7 mmHg
AV Peak grad: 32.4 mmHg
Ao pk vel: 2.85 m/s
Area-P 1/2: 3.05 cm2
MV VTI: 1.71 cm2
S' Lateral: 3.6 cm

## 2023-02-21 ENCOUNTER — Encounter: Payer: Self-pay | Admitting: Internal Medicine

## 2023-02-21 ENCOUNTER — Ambulatory Visit: Payer: Medicare Other | Attending: Internal Medicine | Admitting: Internal Medicine

## 2023-02-21 ENCOUNTER — Other Ambulatory Visit: Payer: Self-pay | Admitting: Internal Medicine

## 2023-02-21 ENCOUNTER — Ambulatory Visit: Payer: Medicare Other | Attending: Internal Medicine

## 2023-02-21 VITALS — BP 128/80 | HR 82 | Ht 65.0 in | Wt 261.0 lb

## 2023-02-21 DIAGNOSIS — Z952 Presence of prosthetic heart valve: Secondary | ICD-10-CM

## 2023-02-21 DIAGNOSIS — I272 Pulmonary hypertension, unspecified: Secondary | ICD-10-CM

## 2023-02-21 DIAGNOSIS — I480 Paroxysmal atrial fibrillation: Secondary | ICD-10-CM

## 2023-02-21 DIAGNOSIS — I099 Rheumatic heart disease, unspecified: Secondary | ICD-10-CM | POA: Diagnosis present

## 2023-02-21 DIAGNOSIS — I071 Rheumatic tricuspid insufficiency: Secondary | ICD-10-CM | POA: Diagnosis not present

## 2023-02-21 MED ORDER — POTASSIUM CHLORIDE CRYS ER 20 MEQ PO TBCR: 20.0000 meq | EXTENDED_RELEASE_TABLET | Freq: Every day | ORAL | 3 refills | Status: DC

## 2023-02-21 NOTE — Progress Notes (Unsigned)
Enrolled for [employer] to mail a ZIO XT long term holter monitor to the patients address on file.  

## 2023-02-21 NOTE — Patient Instructions (Signed)
Medication Instructions:  Your physician has recommended you make the following change in your medication:  TAKE: furosemide (Lasix) 40 mg by mouth twice daily INCREASE: potassium chloride to 20 mEq by mouth twice daily   *If you need a refill on your cardiac medications before your next appointment, please call your pharmacy*   Lab Work: IN 2 WEEKS: BNP, CBC, BMP, BNP  If you have labs (blood work) drawn today and your tests are completely normal, you will receive your results only by: MyChart Message (if you have MyChart) OR A paper copy in the mail If you have any lab test that is abnormal or we need to change your treatment, we will call you to review the results.   Testing/Procedures: AUG 2025- - Your physician has requested that you have an echocardiogram. Echocardiography is a painless test that uses sound waves to create images of your heart. It provides your doctor with information about the size and shape of your heart and how well your heart's chambers and valves are working. This procedure takes approximately one hour. There are no restrictions for this procedure. Please do NOT wear cologne, perfume, aftershave, or lotions (deodorant is allowed). Please arrive 15 minutes prior to your appointment time.    Follow-Up: At [facility], you and your health needs are our priority.  As part of our continuing mission to provide you with exceptional heart care, we have created designated Provider Care Teams.  These Care Teams include your primary Cardiologist (physician) and Advanced Practice Providers (APPs -  Physician Assistants and Nurse Practitioners) who all work together to provide you with the care you need, when you need it.  We recommend signing up for the patient portal called "MyChart".  Sign up information is provided on this After Visit Summary.  MyChart is used to connect with patients for Virtual Visits (Telemedicine).  Patients are able to view lab/test  results, encounter notes, upcoming appointments, etc.  Non-urgent messages can be sent to your provider as well.   To learn more about what you can do with MyChart, go to [email or web address].    Your next appointment:   3-4 month(s)  Provider:   Tereso Newcomer, PA-C     Then, Riley Lam, MDwill plan to see you again in 1 year(s).   Other Instructions ZIO XT- Long Term Monitor Instructions  Your physician has requested you wear a ZIO patch monitor for 7 days.  This is a single patch monitor. [employer] supplies one patch monitor per enrollment. Additional stickers are not available. Please do not apply patch if you will be having a Nuclear Stress Test,   Cardiac CT, MRI, or Chest Xray during the period you would be wearing the  monitor. The patch cannot be worn during these tests. You cannot remove and re-apply the  ZIO XT patch monitor.  Your ZIO patch monitor will be mailed 3 day USPS to your address on file. It may take 3-5 days  to receive your monitor after you have been enrolled.  Once you have received your monitor, please review the enclosed instructions. Your monitor  has already been registered assigning a specific monitor serial # to you.  Billing and Patient Assistance Program Information  We have supplied [employer] with any of your insurance information on file for billing purposes. [employer] offers a sliding scale Patient Assistance Program for patients that do not have  insurance, or whose insurance does not completely cover the cost of the ZIO monitor.  You must  apply for the Patient Assistance Program to qualify for this discounted rate.  To apply, please call [employer] at [phone], select option 4, select option 2, ask to apply for  Patient Assistance Program. Meredeth Ide will ask your household income, and how many people  are in your household. They will quote your out-of-pocket cost based on that information.  [employer] will also be able to set up a 47-month,  interest-free payment plan if needed.  Applying the monitor   Shave hair from upper left chest.  Hold abrader disc by orange tab. Rub abrader in 40 strokes over the upper left chest as  indicated in your monitor instructions.  Clean area with 4 enclosed alcohol pads. Let dry.  Apply patch as indicated in monitor instructions. Patch will be placed under collarbone on left  side of chest with arrow pointing upward.  Rub patch adhesive wings for 2 minutes. Remove white label marked "1". Remove the white  label marked "2". Rub patch adhesive wings for 2 additional minutes.  While looking in a mirror, press and release button in center of patch. A small green light will  flash 3-4 times. This will be your only indicator that the monitor has been turned on.  Do not shower for the first 24 hours. You may shower after the first 24 hours.  Press the button if you feel a symptom. You will hear a small click. Record Date, Time and  Symptom in the Patient Logbook.  When you are ready to remove the patch, follow instructions on the last 2 pages of Patient  Logbook. Stick patch monitor onto the last page of Patient Logbook.  Place Patient Logbook in the blue and white box. Use locking tab on box and tape box closed  securely. The blue and white box has prepaid postage on it. Please place it in the mailbox as  soon as possible. Your physician should have your test results approximately 7 days after the  monitor has been mailed back to [facility].  Call [facility] Customer Care at [phone] if you have questions regarding  your ZIO XT patch monitor. Call them immediately if you see an orange light blinking on your  monitor.  If your monitor falls off in less than 4 days, contact our Monitor department at [phone].  If your monitor becomes loose or falls off after 4 days call [employer] at [phone] for  suggestions on securing your monitor

## 2023-02-21 NOTE — Progress Notes (Addendum)
Cardiology Office Note:  .    Date:  02/21/2023  ID:  [name], DOB 1961/01/01, MRN [id number] PCP: Leonard Downing  [facility] Providers Cardiologist:  Lesleigh Noe, MD (Inactive)     CC: Establish with new cardiologist  History of Present Illness: .    [name] is a 62 y.o. female with a history of rheumatic aortic and mitral valve disease, Atrial flutter, Morbid obesity, ILD on baseline home oxygen, and pulmonary hypertension followed by Dr. Katrinka Blazing.  She presents in 2024.   Discussed the use of AI scribe software for clinical note transcription with the patient, who gave verbal consent to proceed.  History of Present Illness      [name], a 62 year old female with a history of mechanical mitral and aortic valves, paroxysmal atrial flutter, pulmonary hypertension, interstitial lung disease, and morbid obesity, is establishing care with a new cardiologist The patient has had episodes of atrial flutter, which have been noted on previous EKGs; when seeing Lorin Picket she was in normal rhythm but it is unclear if she is truly symptomatic. The patient reports feeling her heart beat rapidly at times, but does not report any associated symptoms such as shortness of breath, chest pain, or dizziness. The patient's weight has increased slightly, which she attributes to increased food intake. The patient denies any worsening of breathing or leg swelling. The patient's interstitial lung disease is being managed by another doctor, Dr. Judeth Horn. The patient is on oxygen therapy for this condition. The patient's mechanical heart valves were placed in 2016, and she has been experiencing some degree of patient prosthesis mismatch of the aortic valve. The patient was evaluated for possible constrictive pericarditis, but this has not been definitively diagnosed due to sub-optimal tissue Doppler evaluation. The patient's pulmonary hypertension is thought to be related  to obesity and interstitial lung disease.    No chest pain.  Relevant histories: .  Social- she has a twin sister who comes to her for visits. ROS: As per HPI.   Studies Reviewed: .   Cardiac Studies & Procedures   CARDIAC CATHETERIZATION  CARDIAC CATHETERIZATION 05/14/2021  Narrative Images from the original result were not included. [name] is a 62 y.o. female   [id number] LOCATION:  FACILITY: [facility] PHYSICIAN: Nanetta Batty, M.D. 26-Dec-1960   DATE OF PROCEDURE:  05/14/2021  DATE OF DISCHARGE:     Right Heart Cath    History obtained from chart review.62 y.o. 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  Was attempting transesophageal echo when noted to be severely hypoxic, chest x-ray revealed pleural effusion, and she was admitted to the hospital for management of hypoxia and new pulmonary findings.  The patient has been diuresed but still remains hypoxic.  2D echo revealed preserved LV function with a normally functioning aortic and mitral valve mechanical prostheses.  She presents now for right heart cath to define her physiology.    HEMODYNAMICS:  Right atrial pressure-16/21, mean 18 Right ventricular pressure-91/9 Pulmonary artery pressure-90/41, mean 60 Pulmonary wedge pressure-A-wave 25, V wave 20, mean 24 Cardiac output-7 L/min with an index of 3.2 L/min/m.  Impression [name] has severe pulmonary hypertension with elevated wedge pressures as well.  She will need additional diuresis.  Her sats were approximately  Cardiology Office Note:  .    Date:  02/21/2023  ID:  [name], DOB 1961/01/01, MRN [id number] PCP: Leonard Downing  [facility] Providers Cardiologist:  Lesleigh Noe, MD (Inactive)     CC: Establish with new cardiologist  History of Present Illness: .    [name] is a 62 y.o. female with a history of rheumatic aortic and mitral valve disease, Atrial flutter, Morbid obesity, ILD on baseline home oxygen, and pulmonary hypertension followed by Dr. Katrinka Blazing.  She presents in 2024.   Discussed the use of AI scribe software for clinical note transcription with the patient, who gave verbal consent to proceed.  History of Present Illness      [name], a 62 year old female with a history of mechanical mitral and aortic valves, paroxysmal atrial flutter, pulmonary hypertension, interstitial lung disease, and morbid obesity, is establishing care with a new cardiologist The patient has had episodes of atrial flutter, which have been noted on previous EKGs; when seeing Lorin Picket she was in normal rhythm but it is unclear if she is truly symptomatic. The patient reports feeling her heart beat rapidly at times, but does not report any associated symptoms such as shortness of breath, chest pain, or dizziness. The patient's weight has increased slightly, which she attributes to increased food intake. The patient denies any worsening of breathing or leg swelling. The patient's interstitial lung disease is being managed by another doctor, Dr. Judeth Horn. The patient is on oxygen therapy for this condition. The patient's mechanical heart valves were placed in 2016, and she has been experiencing some degree of patient prosthesis mismatch of the aortic valve. The patient was evaluated for possible constrictive pericarditis, but this has not been definitively diagnosed due to sub-optimal tissue Doppler evaluation. The patient's pulmonary hypertension is thought to be related  to obesity and interstitial lung disease.    No chest pain.  Relevant histories: .  Social- she has a twin sister who comes to her for visits. ROS: As per HPI.   Studies Reviewed: .   Cardiac Studies & Procedures   CARDIAC CATHETERIZATION  CARDIAC CATHETERIZATION 05/14/2021  Narrative Images from the original result were not included. [name] is a 62 y.o. female   [id number] LOCATION:  FACILITY: [facility] PHYSICIAN: Nanetta Batty, M.D. 26-Dec-1960   DATE OF PROCEDURE:  05/14/2021  DATE OF DISCHARGE:     Right Heart Cath    History obtained from chart review.62 y.o. 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  Was attempting transesophageal echo when noted to be severely hypoxic, chest x-ray revealed pleural effusion, and she was admitted to the hospital for management of hypoxia and new pulmonary findings.  The patient has been diuresed but still remains hypoxic.  2D echo revealed preserved LV function with a normally functioning aortic and mitral valve mechanical prostheses.  She presents now for right heart cath to define her physiology.    HEMODYNAMICS:  Right atrial pressure-16/21, mean 18 Right ventricular pressure-91/9 Pulmonary artery pressure-90/41, mean 60 Pulmonary wedge pressure-A-wave 25, V wave 20, mean 24 Cardiac output-7 L/min with an index of 3.2 L/min/m.  Impression [name] has severe pulmonary hypertension with elevated wedge pressures as well.  She will need additional diuresis.  Her sats were approximately  Cardiology Office Note:  .    Date:  02/21/2023  ID:  [name], DOB 1961/01/01, MRN [id number] PCP: Leonard Downing  [facility] Providers Cardiologist:  Lesleigh Noe, MD (Inactive)     CC: Establish with new cardiologist  History of Present Illness: .    [name] is a 62 y.o. female with a history of rheumatic aortic and mitral valve disease, Atrial flutter, Morbid obesity, ILD on baseline home oxygen, and pulmonary hypertension followed by Dr. Katrinka Blazing.  She presents in 2024.   Discussed the use of AI scribe software for clinical note transcription with the patient, who gave verbal consent to proceed.  History of Present Illness      [name], a 62 year old female with a history of mechanical mitral and aortic valves, paroxysmal atrial flutter, pulmonary hypertension, interstitial lung disease, and morbid obesity, is establishing care with a new cardiologist The patient has had episodes of atrial flutter, which have been noted on previous EKGs; when seeing Lorin Picket she was in normal rhythm but it is unclear if she is truly symptomatic. The patient reports feeling her heart beat rapidly at times, but does not report any associated symptoms such as shortness of breath, chest pain, or dizziness. The patient's weight has increased slightly, which she attributes to increased food intake. The patient denies any worsening of breathing or leg swelling. The patient's interstitial lung disease is being managed by another doctor, Dr. Judeth Horn. The patient is on oxygen therapy for this condition. The patient's mechanical heart valves were placed in 2016, and she has been experiencing some degree of patient prosthesis mismatch of the aortic valve. The patient was evaluated for possible constrictive pericarditis, but this has not been definitively diagnosed due to sub-optimal tissue Doppler evaluation. The patient's pulmonary hypertension is thought to be related  to obesity and interstitial lung disease.    No chest pain.  Relevant histories: .  Social- she has a twin sister who comes to her for visits. ROS: As per HPI.   Studies Reviewed: .   Cardiac Studies & Procedures   CARDIAC CATHETERIZATION  CARDIAC CATHETERIZATION 05/14/2021  Narrative Images from the original result were not included. [name] is a 62 y.o. female   [id number] LOCATION:  FACILITY: [facility] PHYSICIAN: Nanetta Batty, M.D. 26-Dec-1960   DATE OF PROCEDURE:  05/14/2021  DATE OF DISCHARGE:     Right Heart Cath    History obtained from chart review.62 y.o. 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  Was attempting transesophageal echo when noted to be severely hypoxic, chest x-ray revealed pleural effusion, and she was admitted to the hospital for management of hypoxia and new pulmonary findings.  The patient has been diuresed but still remains hypoxic.  2D echo revealed preserved LV function with a normally functioning aortic and mitral valve mechanical prostheses.  She presents now for right heart cath to define her physiology.    HEMODYNAMICS:  Right atrial pressure-16/21, mean 18 Right ventricular pressure-91/9 Pulmonary artery pressure-90/41, mean 60 Pulmonary wedge pressure-A-wave 25, V wave 20, mean 24 Cardiac output-7 L/min with an index of 3.2 L/min/m.  Impression [name] has severe pulmonary hypertension with elevated wedge pressures as well.  She will need additional diuresis.  Her sats were approximately  88% on 4 L of oxygen.  The right antecubital sheath was removed and pressure held.  The patient left lab in stable condition.  Dr. Katrinka Blazing, the patient's attending cardiologist, was notified of these  results.  Nanetta Batty. MD, [facility] 05/14/2021 10:29 AM   STRESS TESTS  NM MYOCAR MULTI W/SPECT W 09/12/2006   ECHOCARDIOGRAM  ECHOCARDIOGRAM COMPLETE 02/10/2023  Narrative ECHOCARDIOGRAM REPORT    Patient Name:   [name] Date of Exam: 02/10/2023 Medical Rec #:  [id number]         Height:       65.0 in Accession #:    [id number]        Weight:       255.4 lb Date of Birth:  1961-02-19          BSA:          2.195 m Patient Age:    62 years          BP:           134/62 mmHg Patient Gender: F                 HR:           71 bpm. Exam Location:  [address]  Procedure: 2D Echo, Cardiac Doppler and Color Doppler  Indications:    Z95.2 S/p MVR/AVR  History:        Patient has prior history of Echocardiogram examinations, most recent 05/04/2021. CHF, Pulmonary HTN, S/p MVR (25mm St Jude) AVR (19mm St Jude); Risk Factors:Hypertension, Dyslipidemia, Former Smoker and Obesity. Aortic Valve: 19 mm St. Jude mechanical valve is present in the aortic position. Procedure Date: 07/2014. Mitral Valve: 25 mm St. Jude mechanical valve valve is present in the mitral position. Procedure Date: 07/2014.  Sonographer:    Samule Ohm RDCS Referring Phys: [id number] SCOTT T WEAVER   Sonographer Comments: Technically difficult study due to poor echo windows and patient is obese. Image acquisition challenging due to patient body habitus. IMPRESSIONS   1. Left ventricular ejection fraction, by estimation, is 65 to 70%. The left ventricle has normal function. The left ventricle has no regional wall motion abnormalities. There is mild left ventricular hypertrophy. Left ventricular diastolic parameters are indeterminate. 2. Right ventricular systolic function is moderately reduced. The right ventricular size is normal. There is severely elevated pulmonary artery systolic pressure. The estimated right ventricular systolic pressure is 78.7 mmHg. 3. Left atrial size was mildly dilated. 4. Right  atrial size was mildly dilated. 5. The mitral valve has been repaired/replaced. Trivial mitral valve regurgitation. The mean mitral valve gradient is 5.3 mmHg with average heart rate of 80 bpm. There is a 25 mm St. Jude mechanical valve present in the mitral position. Procedure Date: 07/2014. Echo findings are consistent with normal structure and function of the mitral valve prosthesis. EOA 2.2, PHT 109 ms, E vel 1.86 m/s, DVI 1.4 6. Tricuspid valve regurgitation is mild to moderate. 7. The aortic valve has been repaired/replaced. There is a 19 mm St. Jude mechanical valve present in the aortic position. Procedure Date: 07/2014. Aortic valve area, by VTI measures 1.22 cm. Aortic valve mean gradient measures 15.7 mmHg. Aortic valve Vmax measures 2.85 m/s. Aortic valve acceleration time measures 82 msec, DVI 0.39, EOA 1.22 cm2, iEOA 0.55 cm2/m2. Probable element of prosthesis patient mismatch. 8. The inferior vena cava is normal in size with greater than 50% respiratory variability, suggesting right atrial pressure of 3 mmHg.  FINDINGS Left Ventricle:  88% on 4 L of oxygen.  The right antecubital sheath was removed and pressure held.  The patient left lab in stable condition.  Dr. Katrinka Blazing, the patient's attending cardiologist, was notified of these  results.  Nanetta Batty. MD, [facility] 05/14/2021 10:29 AM   STRESS TESTS  NM MYOCAR MULTI W/SPECT W 09/12/2006   ECHOCARDIOGRAM  ECHOCARDIOGRAM COMPLETE 02/10/2023  Narrative ECHOCARDIOGRAM REPORT    Patient Name:   [name] Date of Exam: 02/10/2023 Medical Rec #:  [id number]         Height:       65.0 in Accession #:    [id number]        Weight:       255.4 lb Date of Birth:  1961-02-19          BSA:          2.195 m Patient Age:    62 years          BP:           134/62 mmHg Patient Gender: F                 HR:           71 bpm. Exam Location:  [address]  Procedure: 2D Echo, Cardiac Doppler and Color Doppler  Indications:    Z95.2 S/p MVR/AVR  History:        Patient has prior history of Echocardiogram examinations, most recent 05/04/2021. CHF, Pulmonary HTN, S/p MVR (25mm St Jude) AVR (19mm St Jude); Risk Factors:Hypertension, Dyslipidemia, Former Smoker and Obesity. Aortic Valve: 19 mm St. Jude mechanical valve is present in the aortic position. Procedure Date: 07/2014. Mitral Valve: 25 mm St. Jude mechanical valve valve is present in the mitral position. Procedure Date: 07/2014.  Sonographer:    Samule Ohm RDCS Referring Phys: [id number] SCOTT T WEAVER   Sonographer Comments: Technically difficult study due to poor echo windows and patient is obese. Image acquisition challenging due to patient body habitus. IMPRESSIONS   1. Left ventricular ejection fraction, by estimation, is 65 to 70%. The left ventricle has normal function. The left ventricle has no regional wall motion abnormalities. There is mild left ventricular hypertrophy. Left ventricular diastolic parameters are indeterminate. 2. Right ventricular systolic function is moderately reduced. The right ventricular size is normal. There is severely elevated pulmonary artery systolic pressure. The estimated right ventricular systolic pressure is 78.7 mmHg. 3. Left atrial size was mildly dilated. 4. Right  atrial size was mildly dilated. 5. The mitral valve has been repaired/replaced. Trivial mitral valve regurgitation. The mean mitral valve gradient is 5.3 mmHg with average heart rate of 80 bpm. There is a 25 mm St. Jude mechanical valve present in the mitral position. Procedure Date: 07/2014. Echo findings are consistent with normal structure and function of the mitral valve prosthesis. EOA 2.2, PHT 109 ms, E vel 1.86 m/s, DVI 1.4 6. Tricuspid valve regurgitation is mild to moderate. 7. The aortic valve has been repaired/replaced. There is a 19 mm St. Jude mechanical valve present in the aortic position. Procedure Date: 07/2014. Aortic valve area, by VTI measures 1.22 cm. Aortic valve mean gradient measures 15.7 mmHg. Aortic valve Vmax measures 2.85 m/s. Aortic valve acceleration time measures 82 msec, DVI 0.39, EOA 1.22 cm2, iEOA 0.55 cm2/m2. Probable element of prosthesis patient mismatch. 8. The inferior vena cava is normal in size with greater than 50% respiratory variability, suggesting right atrial pressure of 3 mmHg.  FINDINGS Left Ventricle:

## 2023-02-23 DIAGNOSIS — I480 Paroxysmal atrial fibrillation: Secondary | ICD-10-CM | POA: Diagnosis not present

## 2023-02-23 DIAGNOSIS — Z952 Presence of prosthetic heart valve: Secondary | ICD-10-CM | POA: Diagnosis not present

## 2023-02-23 DIAGNOSIS — I071 Rheumatic tricuspid insufficiency: Secondary | ICD-10-CM

## 2023-02-23 DIAGNOSIS — I272 Pulmonary hypertension, unspecified: Secondary | ICD-10-CM

## 2023-03-01 ENCOUNTER — Ambulatory Visit: Payer: Medicare Other

## 2023-03-01 DIAGNOSIS — I480 Paroxysmal atrial fibrillation: Secondary | ICD-10-CM | POA: Diagnosis not present

## 2023-03-01 DIAGNOSIS — I350 Nonrheumatic aortic (valve) stenosis: Secondary | ICD-10-CM | POA: Insufficient documentation

## 2023-03-01 DIAGNOSIS — Z5181 Encounter for therapeutic drug level monitoring: Secondary | ICD-10-CM | POA: Diagnosis not present

## 2023-03-01 LAB — POCT INR: INR: 3.5 — AB (ref 2.0–3.0)

## 2023-03-01 NOTE — Patient Instructions (Signed)
Description   Continue taking Warfarin 1/2 tablet every day except 1 tablet on Wednesdays.  Recheck INR in 4 weeks.  Coumadin Clinic # [phone].

## 2023-03-07 ENCOUNTER — Ambulatory Visit: Payer: Medicare Other | Attending: Internal Medicine

## 2023-03-07 DIAGNOSIS — I071 Rheumatic tricuspid insufficiency: Secondary | ICD-10-CM

## 2023-03-07 DIAGNOSIS — I272 Pulmonary hypertension, unspecified: Secondary | ICD-10-CM

## 2023-03-07 DIAGNOSIS — Z952 Presence of prosthetic heart valve: Secondary | ICD-10-CM

## 2023-03-07 DIAGNOSIS — I480 Paroxysmal atrial fibrillation: Secondary | ICD-10-CM

## 2023-03-07 NOTE — Progress Notes (Unsigned)
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  This is based upon her having an elevated LDH and undetectable haptoglobin level.  Of note, she has 2 mechanical valves which are believed to be the culprits behind her hemolytic process.  Initially, the patient was placed on monthly Retacrit injections to get her hemoglobin at least above 10.  As her hemoglobin has improved over time, her Retacrit injections have been held.  She comes to the clinic today doing very well.  She denies having increased fatigue or other symptoms which concern her for recurrent anemia.  PHYSICAL EXAM:  There were no vitals taken for this visit. Wt Readings from Last 3 Encounters:  02/21/23 261 lb (118.4 kg)  09/07/22 255 lb 6.4 oz (115.8 kg)  08/10/22 253 lb 3.2 oz (114.9 kg)   There is no height or weight on file to calculate BMI. Performance status (ECOG): 2 - Symptomatic, <50% confined to bed Physical Exam Constitutional:      Appearance: Normal appearance. She is not ill-appearing.  HENT:     Mouth/Throat:     Mouth: Mucous membranes are moist.     Pharynx: Oropharynx is clear. No oropharyngeal exudate or posterior oropharyngeal erythema.  Cardiovascular:     Rate and Rhythm: Normal rate and regular rhythm.     Heart sounds: No murmur heard.    No friction rub. No gallop.  Pulmonary:     Effort: Pulmonary effort is normal. No respiratory distress.     Breath sounds: Normal breath sounds. No wheezing, rhonchi or rales.  Abdominal:     General: Bowel sounds are normal. There is no distension.     Palpations: Abdomen is soft. There is no mass.     Tenderness: There is no abdominal tenderness.  Musculoskeletal:        General: No swelling.     Right lower leg: Swelling present. No edema.      Left lower leg: No edema.  Lymphadenopathy:     Cervical: No cervical adenopathy.     Upper Body:     Right upper body: No supraclavicular or axillary adenopathy.     Left upper body: No supraclavicular or axillary adenopathy.     Lower Body: No right inguinal adenopathy. No left inguinal adenopathy.  Skin:    General: Skin is warm.     Coloration: Skin is not jaundiced.     Findings: No lesion or rash.  Neurological:     General: No focal deficit present.     Mental Status: She is alert and oriented to person, place, and time. Mental status is at baseline.  Psychiatric:        Mood and Affect: Mood normal.        Behavior: Behavior normal.        Thought Content: Thought content normal.    LABS:       Latest Ref Rng & Units 09/07/2022   12:00 AM 07/20/2022   11:22 AM 06/22/2022   11:45 AM  CBC  WBC  8.4     7.5  7.0   Hemoglobin 12.0 - 16.0 14.3     13.0  11.8   Hematocrit 36 - 46 42     41.6  39.3   Platelets 150 - 400 K/uL  166     226  223      This result is from an external source.      Latest Ref Rng & Units 04/26/2022    3:42 PM 04/06/2022   12:05 PM 03/18/2022   12:00 AM  CMP  Glucose 70 - 99 mg/dL 84  90    BUN 8 - 23 mg/dL 29  31  23       Creatinine 0.44 - 1.00 mg/dL 0.16  0.10  1.1      Sodium 135 - 145 mmol/L 143  147  142      Potassium 3.5 - 5.1 mmol/L 4.1  4.4  4.1      Chloride 98 - 111 mmol/L 105  104  103      CO2 22 - 32 mmol/L 28  28  33      Calcium 8.9 - 10.3 mg/dL 9.7  93.2  9.5      Total Protein 6.5 - 8.1 g/dL 8.1  7.3    Total Bilirubin 0.3 - 1.2 mg/dL 0.8  0.9    Alkaline Phos 38 - 126 U/L 65  55  64      AST 15 - 41 U/L 21  23  29       ALT 0 - 44 U/L 11  12  14          This result is from an external source.    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  Her hemoglobin of 14.3 is the highest hemoglobin I have ever seen with this patient.   This is with her not taking any Retacrit injections over these past months.  Understandably, the patient is pleased with how high her hemoglobin is.  Based upon this, her Retacrit injections will continue to be held.  I will see her back in 6 months for repeat clinical assessment.  If her hemoglobin remains this ideal at her next visit, I will consider turning her care back over to her other physicians.  The patient understands all the plans discussed today and is in agreement with them.  Lenee Franze Kirby Funk, MD

## 2023-03-08 ENCOUNTER — Inpatient Hospital Stay (INDEPENDENT_AMBULATORY_CARE_PROVIDER_SITE_OTHER): Payer: Medicare Other | Admitting: Oncology

## 2023-03-08 ENCOUNTER — Inpatient Hospital Stay: Payer: Medicare Other | Attending: Oncology

## 2023-03-08 VITALS — BP 128/76 | HR 78 | Temp 98.0°F | Resp 16 | Ht 65.0 in | Wt 256.2 lb

## 2023-03-08 DIAGNOSIS — D599 Acquired hemolytic anemia, unspecified: Secondary | ICD-10-CM

## 2023-03-08 DIAGNOSIS — C7951 Secondary malignant neoplasm of bone: Secondary | ICD-10-CM

## 2023-03-08 DIAGNOSIS — D589 Hereditary hemolytic anemia, unspecified: Secondary | ICD-10-CM | POA: Insufficient documentation

## 2023-03-08 DIAGNOSIS — R7401 Elevation of levels of liver transaminase levels: Secondary | ICD-10-CM | POA: Insufficient documentation

## 2023-03-08 LAB — CBC AND DIFFERENTIAL
HCT: 41 (ref 36–46)
Hemoglobin: 13.5 (ref 12.0–16.0)
Neutrophils Absolute: 6.15
Platelets: 192 10*3/uL (ref 150–400)
WBC: 8.2

## 2023-03-08 LAB — CBC
Hematocrit: 43.5 % (ref 34.0–46.6)
Hemoglobin: 13.7 g/dL (ref 11.1–15.9)
MCH: 31.4 pg (ref 26.6–33.0)
MCHC: 31.5 g/dL (ref 31.5–35.7)
MCV: 100 fL — ABNORMAL HIGH (ref 79–97)
Platelets: 217 10*3/uL (ref 150–450)
RBC: 4.21 (ref 3.87–5.11)
RBC: 4.37 x10E6/uL (ref 3.77–5.28)
RDW: 13.9 % (ref 11.7–15.4)
WBC: 7.7 10*3/uL (ref 3.4–10.8)

## 2023-03-08 LAB — FERRITIN: Ferritin: 37 ng/mL (ref 11–307)

## 2023-03-08 LAB — BASIC METABOLIC PANEL
BUN/Creatinine Ratio: 26 (ref 12–28)
BUN: 37 mg/dL — ABNORMAL HIGH (ref 8–27)
CO2: 25 mmol/L (ref 20–29)
Calcium: 9.9 mg/dL (ref 8.7–10.3)
Chloride: 101 mmol/L (ref 96–106)
Creatinine, Ser: 1.43 mg/dL — ABNORMAL HIGH (ref 0.57–1.00)
Glucose: 90 mg/dL (ref 70–99)
Potassium: 5.2 mmol/L (ref 3.5–5.2)
Sodium: 141 mmol/L (ref 134–144)
eGFR: 41 mL/min/{1.73_m2} — ABNORMAL LOW (ref >59)

## 2023-03-08 LAB — IRON AND TIBC
Iron: 96 ug/dL (ref 28–170)
Saturation Ratios: 24 % (ref 10.4–31.8)
TIBC: 399 ug/dL (ref 250–450)
UIBC: 303 ug/dL

## 2023-03-08 LAB — LACTATE DEHYDROGENASE: LDH: 388 U/L — ABNORMAL HIGH (ref 98–192)

## 2023-03-08 LAB — PRO B NATRIURETIC PEPTIDE: NT-Pro BNP: 1220 pg/mL — ABNORMAL HIGH (ref 0–287)

## 2023-03-09 LAB — HAPTOGLOBIN: Haptoglobin: <10 mg/dL — ABNORMAL LOW (ref 37–355)

## 2023-03-29 ENCOUNTER — Ambulatory Visit: Payer: Medicare Other

## 2023-04-05 ENCOUNTER — Ambulatory Visit: Payer: Medicare Other

## 2023-04-05 DIAGNOSIS — I480 Paroxysmal atrial fibrillation: Secondary | ICD-10-CM | POA: Diagnosis not present

## 2023-04-05 DIAGNOSIS — Z5181 Encounter for therapeutic drug level monitoring: Secondary | ICD-10-CM | POA: Insufficient documentation

## 2023-04-05 DIAGNOSIS — I350 Nonrheumatic aortic (valve) stenosis: Secondary | ICD-10-CM | POA: Insufficient documentation

## 2023-04-05 LAB — POCT INR: INR: 4.3 — AB (ref 2.0–3.0)

## 2023-04-05 NOTE — Patient Instructions (Signed)
Description   HOLD today's dose and then continue taking Warfarin 1/2 tablet every day except 1 tablet on Wednesdays.  Recheck INR in 2 weeks.  Coumadin Clinic # [phone].

## 2023-04-19 ENCOUNTER — Ambulatory Visit: Payer: Medicare Other

## 2023-04-19 DIAGNOSIS — Z5181 Encounter for therapeutic drug level monitoring: Secondary | ICD-10-CM | POA: Insufficient documentation

## 2023-04-19 DIAGNOSIS — I480 Paroxysmal atrial fibrillation: Secondary | ICD-10-CM | POA: Diagnosis present

## 2023-04-19 DIAGNOSIS — I350 Nonrheumatic aortic (valve) stenosis: Secondary | ICD-10-CM | POA: Insufficient documentation

## 2023-04-19 LAB — POCT INR: INR: 3.4 — AB (ref 2.0–3.0)

## 2023-04-19 NOTE — Patient Instructions (Signed)
Description   Continue taking Warfarin 1/2 tablet every day except 1 tablet on Wednesdays.  Recheck INR in 3 weeks.  Coumadin Clinic # [phone].

## 2023-04-28 ENCOUNTER — Other Ambulatory Visit: Payer: Self-pay | Admitting: Internal Medicine

## 2023-04-28 DIAGNOSIS — I480 Paroxysmal atrial fibrillation: Secondary | ICD-10-CM

## 2023-05-10 ENCOUNTER — Ambulatory Visit: Payer: Medicare Other | Attending: Cardiology

## 2023-05-10 DIAGNOSIS — Z5181 Encounter for therapeutic drug level monitoring: Secondary | ICD-10-CM | POA: Diagnosis not present

## 2023-05-10 DIAGNOSIS — I480 Paroxysmal atrial fibrillation: Secondary | ICD-10-CM | POA: Diagnosis not present

## 2023-05-10 LAB — POCT INR: INR: 2.9 (ref 2.0–3.0)

## 2023-05-10 NOTE — Patient Instructions (Signed)
Description   Continue taking Warfarin 1/2 tablet every day except 1 tablet on Wednesdays.  Recheck INR in 4 weeks.  Coumadin Clinic # [phone].

## 2023-06-04 ENCOUNTER — Other Ambulatory Visit: Payer: Self-pay | Admitting: Pulmonary Disease

## 2023-06-06 NOTE — Progress Notes (Addendum)
 Cardiology Office Note:    Date:  06/08/2023  ID:  [name], DOB 15-Dec-1960, MRN [id number] PCP: [name]  [facility] Providers Cardiologist:  Diana Constant, MD       Patient Profile:      [name], mechanical MVR (Dr. Silvestre Young, [facility]) in 07/2014 No CAD on cath prior to surgery Patient-Prosthesis Mismatch TTE 05/04/2021: EF 55-60, no RWMA, normal RVSF, severely elevated PASP, moderate LAE, mechanical MVR with mean gradient 6, moderate TR, mechanical AVR with mean gradient 15, RAP 8 TTE 02/10/2023: EF 65-70, no RWMA, mild LVH, moderately reduced RVSF, severe RVSP 78.7 mild BAE, mechanical MVR with trivial MR, mean 5.3 mmHg normal structure and function, mild-moderate TR, mechanical AVR with mean gradient 15.7 mmHg, probable element of prosthesis patient mismatch, RAP 3  Chronic hypoxic respiratory failure Pulm: Dr. 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 Silvestre Young at [facility] 2022 - conservative mgmt Paroxysmal atrial fibrillation  Atrial flutter Monitor 03/2023: avg HR 70 (HFpEF) heart failure with preserved ejection fraction  Chronic coumadin Rx Chronic kidney disease  Hypertension  Hyperlipidemia  Hereditary hemachromatosis  OSA Obesity s/p bariatric surgery          History of Present Illness:  Discussed the use of AI scribe software for clinical note transcription with the patient, who gave verbal consent to proceed.  [name] is a 63 y.o. female who returns for follow up of CHF, valvular heart disease, atrial flutter. She was last seen in clinic to establish with  Dr. Rolly Young. Her Furosemide was adjusted for volume excess. A 7 day monitor demonstrated persistent atrial flutter. She is here with her twin sister. She is overall stable. She denies any worsening of shortness of breath. She has not had chest pain, or syncope. She also denies any changes in R leg swelling, which is a chronic issue due to lymphedema. The patient is on continuous oxygen therapy. She is unable to climb stairs due to breathlessness and weak knees. At night, she uses two regular pillows for propping up to breathe better. The patient also reports a restless leg syndrome-like symptom, with her leg moving on its own. She has been advised to undergo a sleep study by pulmonology due to suspected sleep apnea, but this has not yet been done.    Review of Systems  Gastrointestinal:  Negative for hematochezia.  Genitourinary:  Negative for hematuria.  -See HPI     Studies Reviewed:   EKG Interpretation Date/Time:  Wednesday June 08 2023 10:38:24 EST Ventricular Rate:  87 PR Interval:    QRS Duration:  104 QT Interval:  380 QTC Calculation: 457 R Axis:   95  Text Interpretation: Atrial flutter with variable A-V block Pulmonary disease pattern No significant change since last tracing Confirmed by [name] [phone]) on 06/08/2023 10:47:35 AM    Labs-chart review 03/07/2023: Creatinine 1.43, K 5.2        Risk Assessment/Calculations:    CHA2DS2-VASc Score = 3   This indicates a 3.2% annual risk of stroke. The patient's score is based upon: CHF History: 1 HTN History: 1 Diabetes History: 0 Stroke  History: 0 Vascular Disease History: 0 Age Score: 0 Gender Score: 1            Physical Exam:   VS:  BP 110/70   Pulse 87   Ht 5' 6.5" (1.689 m)   Wt 261 lb 12.8 oz (118.8 kg)   SpO2 93%   BMI 41.62 kg/m    Wt Readings from Last 3 Encounters:  06/08/23 261 lb 12.8 oz (118.8 kg)  03/08/23 256 lb 3.2 oz (116.2 kg)  02/21/23 261 lb (118.4 kg)    Constitutional:       Appearance: Healthy appearance. Not in distress.  Pulmonary:     Effort: Pulmonary effort is normal.     Breath sounds: No wheezing. No rales.  Cardiovascular:     Normal rate. Irregular rhythm. S1. Mechanical S2. Mechanical   Edema:    Peripheral edema present.    Pretibial: 2+ non-pitting edema of the right pretibial area.    Ankle: 2+ non-pitting edema of the right ankle. Abdominal:     Palpations: Abdomen is soft.  Skin:    General: Skin is warm and dry.        Assessment and Plan:   Assessment & Plan Atrial flutter, unspecified type (HCC) Persistent atrial flutter confirmed by recent Zio patch monitor and current EKG. Heart rate is controlled. Discussed cardioversion as a reasonable option to restore sinus rhythm. Explained potential recurrence and further possible interventions like ablation. We would need to avoid amiodarone due to her hx and potential for lung toxicity. We discussed the need for therapeutic INR for three weeks prior to cardioversion to avoid transesophageal echocardiogram.  - Check INR today and weekly for the next three weeks in [place] - Schedule cardioversion for the week of February 3rd (will need to postpone if INR dips below 2 in that time). - Continue Metoprolol tartrate 50 mg once daily - Follow up with Dr. Izora Young or me in 6 weeks - If she has recurrent AFlutter, consider referral to EP.   Chronic heart failure with preserved ejection fraction (HCC) NYHA III. She has chronic respiratory failure and is on chronic O2. 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 Overall volume status seems stable.  -Continue Lasix 40 mg twice daily -Consider adding Spironolactone if K+ low or SGLT2 inhib if volume difficult to manage -BMET, NT Pro BNP -Adjust Furosemide if BNP higher  Valvular heart disease History of mechanical AVR and mechanical MVR by Dr. Silvestre Young at [facility] in March 2016.  Echocardiogram in September 2024 with normal EF, mild LVH,  moderately reduced RV SF, severe RVSP mechanical MVR with trivial MR and mean gradient 5.3 prosthesis patient mismatch. She was previously evaluated by Dr. Silvestre Young again in the setting of acquired hemolytic anemia.  He did not feel that she would be a candidate for redo valve surgery and has recommended conservative approach.  Patient is overall stable.  Will plan repeat echo in August 2025.  Stage 3b chronic kidney disease (HCC) Obtain follow-up BMET today.  Essential hypertension BP controlled. Continue Lasix 40 mg twice daily, Metoprolol tartrate 50 mg once daily   Interstitial lung disease (HCC) She has a hx of chronic respiratory failure and severe pulmonary hypertension. She is followed by Dr. Judeth Young with pulmonology. I reviewed prior notes from Dr. Judeth Young and Dr. Katrinka Blazing. The patient has a hx of multifocal lung infiltrates. 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 She also  has a hx of hemolytic anemia felt to be due to mechanical AVR/MVR. She did see Dr. Silvestre Young at [facility] in 2022. He did not think she was a candidate for redo valve surgery and recommended conservative management.     Informed Consent   Shared Decision Making/Informed Consent The risks (stroke, cardiac arrhythmias rarely resulting in the need for a temporary or permanent pacemaker, skin irritation or burns and complications associated with conscious sedation including aspiration, arrhythmia, respiratory failure and death), benefits (restoration of normal sinus rhythm) and alternatives of a direct current cardioversion were explained in detail to Ms. Lepp and she agrees to proceed.       Dispo:  Return in about 6 weeks (around 07/20/2023) for Post Procedure Follow Up, w/ Dr. Izora Young, or Diana Newcomer, PA-C.  Signed, Diana Newcomer, PA-C    Not personally seen or examined. Agree with APP above- trial of rhythm control is appropriate.  No  significant LA remodeling.

## 2023-06-06 NOTE — H&P (View-Only) (Signed)
Cardiology Office Note:    Date:  06/08/2023  ID:  [name], DOB 15-Dec-1960, MRN [id number] PCP: Leonard Downing  [facility] Providers Cardiologist:  Christell Constant, MD       Patient Profile:      [name] S/p mechanical AVR, mechanical MVR (Dr. Silvestre Mesi, [facility]) in 07/2014 No CAD on cath prior to surgery Patient-Prosthesis Mismatch TTE 05/04/2021: EF 55-60, no RWMA, normal RVSF, severely elevated PASP, moderate LAE, mechanical MVR with mean gradient 6, moderate TR, mechanical AVR with mean gradient 15, RAP 8 TTE 02/10/2023: EF 65-70, no RWMA, mild LVH, moderately reduced RVSF, severe RVSP 78.7 mild BAE, mechanical MVR with trivial MR, mean 5.3 mmHg normal structure and function, mild-moderate TR, mechanical AVR with mean gradient 15.7 mmHg, probable element of prosthesis patient mismatch, RAP 3  Chronic hypoxic respiratory failure Pulm: Dr. Judeth Horn - hx of multifocal lung infiltrates>>c/w hemosiderin laden macrophages likely in the setting of uncontrolled MV Young causing hemoptysis  Pulmonary hypertension  RHC 05/14/2021: mean RA 18, RV 91/9, mean PAP 60, mean PCWP 24, CO 7, CI 3.2 Intrinsic asthma Acquired hemolytic anemia  Felt to be due to mechanical valves Not a candidate for redo surgery; eval by Dr. Silvestre Mesi at [facility] 2022 - conservative mgmt Paroxysmal atrial fibrillation  Atrial flutter Monitor 03/2023: avg HR 70 (HFpEF) heart failure with preserved ejection fraction  Chronic coumadin Rx Chronic kidney Young  Hypertension  Hyperlipidemia  Hereditary hemachromatosis  OSA Obesity s/p bariatric surgery          History of Present Illness:  Discussed the use of AI scribe software for clinical note transcription with the patient, who gave verbal consent to proceed.  [name] is a 63 y.o. female who returns for follow up of CHF, [name], atrial flutter. She was last seen in clinic to establish with  Dr. Rolly Salter. Her Furosemide was adjusted for volume excess. A 7 day monitor demonstrated persistent atrial flutter. She is here with her twin sister. She is overall stable. She denies any worsening of shortness of breath. She has not had chest pain, or syncope. She also denies any changes in R leg swelling, which is a chronic issue due to lymphedema. The patient is on continuous oxygen therapy. She is unable to climb stairs due to breathlessness and weak knees. At night, she uses two regular pillows for propping up to breathe better. The patient also reports a restless leg syndrome-like symptom, with her leg moving on its own. She has been advised to undergo a sleep study by pulmonology due to suspected sleep apnea, but this has not yet been done.    Review of Systems  Gastrointestinal:  Negative for hematochezia.  Genitourinary:  Negative for hematuria.  -See HPI     Studies Reviewed:   EKG Interpretation Date/Time:  Wednesday June 08 2023 10:38:24 EST Ventricular Rate:  87 PR Interval:    QRS Duration:  104 QT Interval:  380 QTC Calculation: 457 R Axis:   95  Text Interpretation: Atrial flutter with variable A-V block Pulmonary Young pattern No significant change since last tracing Confirmed by Tereso Newcomer [phone]) on 06/08/2023 10:47:35 AM    Labs-chart review 03/07/2023: Creatinine 1.43, K 5.2        Risk Assessment/Calculations:    CHA2DS2-VASc Score = 3   This indicates a 3.2% annual risk of stroke. The patient's score is based upon: CHF History: 1 HTN History: 1 Diabetes History: 0 Stroke  History: 0 Vascular Young History: 0 Age Score: 0 Gender Score: 1            Physical Exam:   VS:  BP 110/70   Pulse 87   Ht 5' 6.5" (1.689 m)   Wt 261 lb 12.8 oz (118.8 kg)   SpO2 93%   BMI 41.62 kg/m    Wt Readings from Last 3 Encounters:  06/08/23 261 lb 12.8 oz (118.8 kg)  03/08/23 256 lb 3.2 oz (116.2 kg)  02/21/23 261 lb (118.4 kg)    Constitutional:       Appearance: Healthy appearance. Not in distress.  Pulmonary:     Effort: Pulmonary effort is normal.     Breath sounds: No wheezing. No rales.  Cardiovascular:     Normal rate. Irregular rhythm. S1. Mechanical S2. Mechanical   Edema:    Peripheral edema present.    Pretibial: 2+ non-pitting edema of the right pretibial area.    Ankle: 2+ non-pitting edema of the right ankle. Abdominal:     Palpations: Abdomen is soft.  Skin:    General: Skin is warm and dry.        Assessment and Plan:   Assessment & Plan Atrial flutter, unspecified type (HCC) Persistent atrial flutter confirmed by recent Zio patch monitor and current EKG. Heart rate is controlled. Discussed cardioversion as a reasonable option to restore sinus rhythm. Explained potential recurrence and further possible interventions like ablation. We would need to avoid amiodarone due to her hx and potential for lung toxicity. We discussed the need for therapeutic INR for three weeks prior to cardioversion to avoid transesophageal echocardiogram.  - Check INR today and weekly for the next three weeks in [place] - Schedule cardioversion for the week of February 3rd (will need to postpone if INR dips below 2 in that time). - Continue Metoprolol tartrate 50 mg once daily - Follow up with Dr. Izora Ribas or me in 6 weeks - If she has recurrent AFlutter, consider referral to EP.   Chronic heart failure with preserved ejection fraction (HCC) NYHA III. She has chronic respiratory failure and is on chronic O2. 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 Overall volume status seems stable.  -Continue Lasix 40 mg twice daily -Consider adding Spironolactone if K+ low or SGLT2 inhib if volume difficult to manage -BMET, NT Pro BNP -Adjust Furosemide if BNP higher  [name] History of mechanical AVR and mechanical MVR by Dr. Silvestre Mesi at [facility] in March 2016.  Echocardiogram in September 2024 with normal EF, mild LVH,  moderately reduced RV SF, severe RVSP mechanical MVR with trivial MR and mean gradient 5.3 prosthesis patient mismatch. She was previously evaluated by Dr. Silvestre Mesi again in the setting of acquired hemolytic anemia.  He did not feel that she would be a candidate for redo valve surgery and has recommended conservative approach.  Patient is overall stable.  Will plan repeat echo in August 2025.  Stage 3b chronic kidney Young (HCC) Obtain follow-up BMET today.  Essential hypertension BP controlled. Continue Lasix 40 mg twice daily, Metoprolol tartrate 50 mg once daily   Interstitial lung Young (HCC) She has a hx of chronic respiratory failure and severe pulmonary hypertension. She is followed by Dr. Judeth Horn with pulmonology. I reviewed prior notes from Dr. Judeth Horn and Dr. Katrinka Blazing. The patient has a hx of multifocal lung infiltrates. 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 She also  has a hx of hemolytic anemia felt to be due to mechanical AVR/MVR. She did see Dr. Silvestre Mesi at [facility] in 2022. He did not think she was a candidate for redo valve surgery and recommended conservative management.     Informed Consent   Shared Decision Making/Informed Consent The risks (stroke, cardiac arrhythmias rarely resulting in the need for a temporary or permanent pacemaker, skin irritation or burns and complications associated with conscious sedation including aspiration, arrhythmia, respiratory failure and death), benefits (restoration of normal sinus rhythm) and alternatives of a direct current cardioversion were explained in detail to Ms. Lepp and she agrees to proceed.       Dispo:  Return in about 6 weeks (around 07/20/2023) for Post Procedure Follow Up, w/ Dr. Izora Ribas, or Tereso Newcomer, PA-C.  Signed, Tereso Newcomer, PA-C    Not personally seen or examined. Agree with APP above- trial of rhythm control is appropriate.  No  significant LA remodeling.

## 2023-06-07 ENCOUNTER — Ambulatory Visit: Payer: Medicare Other

## 2023-06-08 ENCOUNTER — Other Ambulatory Visit: Payer: Self-pay | Admitting: *Deleted

## 2023-06-08 ENCOUNTER — Ambulatory Visit: Payer: Medicare Other | Attending: Physician Assistant | Admitting: Physician Assistant

## 2023-06-08 ENCOUNTER — Encounter: Payer: Self-pay | Admitting: Physician Assistant

## 2023-06-08 VITALS — BP 110/70 | HR 87 | Ht 66.5 in | Wt 261.8 lb

## 2023-06-08 DIAGNOSIS — I38 Endocarditis, valve unspecified: Secondary | ICD-10-CM | POA: Insufficient documentation

## 2023-06-08 DIAGNOSIS — I5032 Chronic diastolic (congestive) heart failure: Secondary | ICD-10-CM

## 2023-06-08 DIAGNOSIS — I4892 Unspecified atrial flutter: Secondary | ICD-10-CM | POA: Insufficient documentation

## 2023-06-08 DIAGNOSIS — N1832 Chronic kidney disease, stage 3b: Secondary | ICD-10-CM | POA: Diagnosis present

## 2023-06-08 DIAGNOSIS — J849 Interstitial pulmonary disease, unspecified: Secondary | ICD-10-CM | POA: Diagnosis present

## 2023-06-08 DIAGNOSIS — I10 Essential (primary) hypertension: Secondary | ICD-10-CM | POA: Diagnosis present

## 2023-06-08 NOTE — Assessment & Plan Note (Signed)
 Persistent atrial flutter confirmed by recent Zio patch monitor and current EKG. Heart rate is controlled. Discussed cardioversion as a reasonable option to restore sinus rhythm. Explained potential recurrence and further possible interventions like ablation. We would need to avoid amiodarone  due to her hx and potential for lung toxicity. We discussed the need for therapeutic INR for three weeks prior to cardioversion to avoid transesophageal echocardiogram.  - Check INR today and weekly for the next three weeks in [place] - Schedule cardioversion for the week of February 3rd (will need to postpone if INR dips below 2 in that time). - Continue Metoprolol  tartrate 50 mg once daily - Follow up with Dr. Santo or me in 6 weeks - If she has recurrent AFlutter, consider referral to EP.

## 2023-06-08 NOTE — Assessment & Plan Note (Signed)
 History of mechanical AVR and mechanical MVR by Dr. Alford at [facility] in March 2016.  Echocardiogram in September 2024 with normal EF, mild LVH, moderately reduced RV SF, severe RVSP mechanical MVR with trivial MR and mean gradient 5.3 prosthesis patient mismatch. She was previously evaluated by Dr. Alford again in the setting of acquired hemolytic anemia.  He did not feel that she would be a candidate for redo valve surgery and has recommended conservative approach.  Patient is overall stable.  Will plan repeat echo in August 2025.

## 2023-06-08 NOTE — Assessment & Plan Note (Signed)
 BP controlled. Continue Lasix 40 mg twice daily, Metoprolol tartrate 50 mg once daily

## 2023-06-08 NOTE — Patient Instructions (Signed)
 Medication Instructions:  Your physician recommends that you continue on your current medications as directed. Please refer to the Current Medication list given to you today.  *If you need a refill on your cardiac medications before your next appointment, please call your pharmacy*   Lab Work: TODAY:  BMET, PRO BNP, & PT/INR   07/01/23:  GO TO A LAB CORP NEAR YOU FOR BMET & CBC (OR THE [place] OFFICE)  If you have labs (blood work) drawn today and your tests are completely normal, you will receive your results only by: MyChart Message (if you have MyChart) OR A paper copy in the mail If you have any lab test that is abnormal or we need to change your treatment, we will call you to review the results.   Testing/Procedures: Your physician has recommended that you have a Cardioversion (DCCV). Electrical Cardioversion uses a jolt of electricity to your heart either through paddles or wired patches attached to your chest. This is a controlled, usually prescheduled, procedure. Defibrillation is done under light anesthesia in the hospital, and you usually go home the day of the procedure. This is done to get your heart back into a normal rhythm. You are not awake for the procedure. Please see the instruction sheet given BELOW:      Dear [name] are scheduled for a Cardioversion on Monday, February 3 with Dr. BARBARAANN.  Please arrive at the [facility] (Main Entrance A) at [facility]: [address] [place], [place] [address] at 6:30 AM (This time is 1 hour(s) before your procedure to ensure your preparation).   Free valet parking service is available. You will check in at ADMITTING.   *Please Note: You will receive a call the day before your procedure to confirm the appointment time. That time may have changed from the original time based on the schedule for that day.*    DIET:  Nothing to eat or drink after midnight except a sip of water  with medications (see  medication instructions below)  MEDICATION INSTRUCTIONS: !!IF ANY NEW MEDICATIONS ARE STARTED AFTER TODAY, PLEASE NOTIFY YOUR PROVIDER AS SOON AS POSSIBLE!!  FYI: Medications such as Semaglutide (Ozempic, Wegovy), Tirzepatide (Mounjaro, Zepbound), Dulaglutide (Trulicity), etc (GLP1 agonists) AND Canagliflozin (Invokana), Dapagliflozin  (Farxiga ), Empagliflozin (Jardiance), Ertugliflozin (Steglatro), Bexagliflozin [employer]) or any combination with one of these drugs such as Invokamet (Canagliflozin/Metformin), Synjardy (Empagliflozin/Metformin), etc (SGLT2 inhibitors) must be held around the time of a procedure. This is not a comprehensive list of all of these drugs. Please review all of your medications and talk to your provider if you take any one of these. If you are not sure, ask your provider.  HOLD LASIX    Continue taking your anticoagulant (blood thinner): Warfarin (Coumadin ).  You will need to continue this after your procedure until you are told by your provider that it is safe to stop.    LABS:   Come to ANY [facility], ON FRIDAY 07/01/23  FYI:  For your safety, and to allow us  to monitor your vital signs accurately during the surgery/procedure we request: If you have artificial nails, gel coating, SNS etc, please have those removed prior to your surgery/procedure. Not having the nail coverings /polish removed may result in cancellation or delay of your surgery/procedure.  Your support person will be asked to wait in the waiting room during your procedure.  It is OK to have someone drop you off and come back when you are ready to be discharged.  You  cannot drive after the procedure and will need someone to drive you home.  Bring your insurance cards.  *Special Note: Every effort is made to have your procedure done on time. Occasionally there are emergencies that occur at the hospital that may cause delays. Please be patient if a delay does occur.       Follow-Up: At [facility], you and your health needs are our priority.  As part of our continuing mission to provide you with exceptional heart care, we have created designated Provider Care Teams.  These Care Teams include your primary Cardiologist (physician) and Advanced Practice Providers (APPs -  Physician Assistants and Nurse Practitioners) who all work together to provide you with the care you need, when you need it.  We recommend signing up for the patient portal called MyChart.  Sign up information is provided on this After Visit Summary.  MyChart is used to connect with patients for Virtual Visits (Telemedicine).  Patients are able to view lab/test results, encounter notes, upcoming appointments, etc.  Non-urgent messages can be sent to your provider as well.   To learn more about what you can do with MyChart, go to [email or web address].    Your next appointment:   6 WEEKS SCOTT WEAVER, PA-C  Provider:   Stanly DELENA Leavens, MD     Other Instructions  PT NEEDS WEEKLY INR CHECKS AT THE [place] OFFICE, WEEKLY, X'S 3, STARTING NEXT WEEK

## 2023-06-08 NOTE — Assessment & Plan Note (Signed)
Obtain follow-up BMET today. 

## 2023-06-08 NOTE — Assessment & Plan Note (Signed)
 NYHA III. She has chronic respiratory failure and is on chronic O2. 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 Overall volume status seems stable.  -Continue Lasix  40 mg twice daily -Consider adding Spironolactone  if K+ low or SGLT2 inhib if volume difficult to manage -BMET, NT Pro BNP -Adjust Furosemide  if BNP higher

## 2023-06-09 ENCOUNTER — Other Ambulatory Visit: Payer: Self-pay

## 2023-06-09 ENCOUNTER — Ambulatory Visit (INDEPENDENT_AMBULATORY_CARE_PROVIDER_SITE_OTHER): Payer: Medicare Other

## 2023-06-09 ENCOUNTER — Telehealth: Payer: Self-pay

## 2023-06-09 DIAGNOSIS — Z5181 Encounter for therapeutic drug level monitoring: Secondary | ICD-10-CM

## 2023-06-09 DIAGNOSIS — N1832 Chronic kidney disease, stage 3b: Secondary | ICD-10-CM

## 2023-06-09 DIAGNOSIS — I5032 Chronic diastolic (congestive) heart failure: Secondary | ICD-10-CM

## 2023-06-09 LAB — BASIC METABOLIC PANEL
BUN/Creatinine Ratio: 24 (ref 12–28)
BUN: 29 mg/dL — ABNORMAL HIGH (ref 8–27)
CO2: 24 mmol/L (ref 20–29)
Calcium: 9.8 mg/dL (ref 8.7–10.3)
Chloride: 102 mmol/L (ref 96–106)
Creatinine, Ser: 1.23 mg/dL — ABNORMAL HIGH (ref 0.57–1.00)
Glucose: 87 mg/dL (ref 70–99)
Potassium: 4.4 mmol/L (ref 3.5–5.2)
Sodium: 144 mmol/L (ref 134–144)
eGFR: 49 mL/min/{1.73_m2} — ABNORMAL LOW (ref >59)

## 2023-06-09 LAB — PROTIME-INR
INR: 3.1 — ABNORMAL HIGH (ref 0.9–1.2)
Prothrombin Time: 32.2 s — ABNORMAL HIGH (ref 9.1–12.0)

## 2023-06-09 LAB — PRO B NATRIURETIC PEPTIDE: NT-Pro BNP: 1718 pg/mL — ABNORMAL HIGH (ref 0–287)

## 2023-06-09 MED ORDER — FUROSEMIDE 40 MG PO TABS: 60.0000 mg | ORAL_TABLET | Freq: Two times a day (BID) | ORAL | 2 refills | Status: DC

## 2023-06-09 MED ORDER — POTASSIUM CHLORIDE CRYS ER 20 MEQ PO TBCR: 20.0000 meq | EXTENDED_RELEASE_TABLET | Freq: Two times a day (BID) | ORAL | 3 refills | Status: DC

## 2023-06-09 NOTE — Patient Instructions (Signed)
 Description   Called and spoke with pt. Instructed pt to continue taking Warfarin 1/2 tablet every day except 1 tablet on Wednesdays.  Recheck INR in 1 week - DCCV on 2/3 (NEEDS WEEKLY CHECKS)  Coumadin Clinic # [phone].

## 2023-06-09 NOTE — Addendum Note (Signed)
 Addended byAlben Spittle, Lorin Picket T on: 06/09/2023 11:10 AM   Modules accepted: Orders

## 2023-06-09 NOTE — Telephone Encounter (Signed)
 Spoke with patient and discussed lab results and recommendation from [facility] [place].  Per Glendia: PLAN:  -Increase furosemide  to 60 mg twice daily -Increase K+ to 20 mEq twice daily -BMET 1 week (I placed order for BMET in epic already)   Ms. Mondesir  Your creatinine (kidney function) is stable.  Your potassium is normal.  Your INR is in range.  I have forwarded to the Coumadin  clinic for monitoring/dosing and follow-up.  The heart failure test (NT-proBNP) is elevated.  Lets increase furosemide  (Lasix ) to 60 mg twice daily and increase potassium to 20 mEq twice daily.  I will recheck a lab test in 1 week to monitor kidney function.  Medication list updated. Order for BMET released to [facility]. Patient states she will have drawn at next Coumadin  Clinic appt on 06/14/23.  Patient verbalized understanding of the above.

## 2023-06-09 NOTE — Telephone Encounter (Signed)
 Called and spoke with pt. Provided dosing instructions (refer to anticoagulation encounter) and scheduled appt for her next weekly INR check on 06/14/23 at [facility]. Pt verbalize understanding.

## 2023-06-09 NOTE — Telephone Encounter (Signed)
-----   Message from Glendia Ferrier sent at 06/09/2023 11:10 AM EST ----- Result note sent to [name] via MyChart. See comments below. PLAN:  -Increase furosemide  to 60 mg twice daily -Increase K+ to 20 mEq twice daily -BMET 1 week (I placed order for BMET in epic already)  [name]  Your creatinine (kidney function) is stable.  Your potassium is normal.  Your INR is in range.  I have forwarded to the Coumadin  clinic for monitoring/dosing and follow-up.  The heart failure test (NT-proBNP) is elevated.  Lets increase furosemide  (Lasix ) to 60 mg twice daily and increase potassium to 20 mEq twice daily.  I will recheck a lab test in 1 week to monitor kidney function. Glendia Ferrier, PA-C    06/09/2023 11:04 AM

## 2023-06-09 NOTE — Telephone Encounter (Signed)
-----   Message from Glendia Ferrier sent at 06/09/2023 11:07 AM EST ----- Erskin Chroman I saw Ms Heacock yesterday. We are going to set her up for a cardioversion. I drew her INR yesterday and wanted to forward to you for dosing. She will need an INR drawn weekly for the next 3 weeks. DCCV is scheduled for 2/3. Can you make sure she gets scheduled with you next week for the next check? Thanks, [employer]

## 2023-06-14 ENCOUNTER — Other Ambulatory Visit: Payer: Self-pay | Admitting: Pharmacist

## 2023-06-14 ENCOUNTER — Telehealth: Payer: Self-pay | Admitting: Pharmacist

## 2023-06-14 ENCOUNTER — Ambulatory Visit: Payer: Medicare Other

## 2023-06-14 DIAGNOSIS — I480 Paroxysmal atrial fibrillation: Secondary | ICD-10-CM

## 2023-06-14 DIAGNOSIS — Z952 Presence of prosthetic heart valve: Secondary | ICD-10-CM

## 2023-06-14 NOTE — Telephone Encounter (Signed)
 Placing lab order for weekly INR draw

## 2023-06-14 NOTE — Progress Notes (Deleted)
 Placing lab orders for weekly INR draw

## 2023-06-15 ENCOUNTER — Ambulatory Visit (INDEPENDENT_AMBULATORY_CARE_PROVIDER_SITE_OTHER): Payer: Medicare Other

## 2023-06-15 DIAGNOSIS — Z5181 Encounter for therapeutic drug level monitoring: Secondary | ICD-10-CM

## 2023-06-15 LAB — BASIC METABOLIC PANEL
BUN/Creatinine Ratio: 22 (ref 12–28)
BUN: 31 mg/dL — ABNORMAL HIGH (ref 8–27)
CO2: 26 mmol/L (ref 20–29)
Calcium: 9.8 mg/dL (ref 8.7–10.3)
Chloride: 99 mmol/L (ref 96–106)
Creatinine, Ser: 1.39 mg/dL — ABNORMAL HIGH (ref 0.57–1.00)
Glucose: 87 mg/dL (ref 70–99)
Potassium: 5 mmol/L (ref 3.5–5.2)
Sodium: 142 mmol/L (ref 134–144)
eGFR: 43 mL/min/{1.73_m2} — ABNORMAL LOW (ref >59)

## 2023-06-15 LAB — PROTIME-INR
INR: 2.2 — ABNORMAL HIGH (ref 0.9–1.2)
Prothrombin Time: 23.3 s — ABNORMAL HIGH (ref 9.1–12.0)

## 2023-06-15 NOTE — Patient Instructions (Signed)
 Description   Called and spoke with pt. Instructed pt to take 1.5 tablets today and then continue taking Warfarin 1/2 tablet every day except 1 tablet on Wednesdays.  Recheck INR in 1 week - DCCV on 2/3 (NEEDS WEEKLY CHECKS)  Coumadin  Clinic # [phone].

## 2023-06-21 ENCOUNTER — Ambulatory Visit: Payer: Medicare Other | Attending: Cardiology

## 2023-06-21 DIAGNOSIS — Z5181 Encounter for therapeutic drug level monitoring: Secondary | ICD-10-CM | POA: Diagnosis not present

## 2023-06-21 DIAGNOSIS — I480 Paroxysmal atrial fibrillation: Secondary | ICD-10-CM | POA: Insufficient documentation

## 2023-06-21 LAB — POCT INR: INR: 2.7 (ref 2.0–3.0)

## 2023-06-21 NOTE — Patient Instructions (Signed)
Description   Take 1 tablet today and then continue taking Warfarin 1/2 tablet every day except 1 tablet on Wednesdays.  Recheck INR in 1 week - DCCV on 2/3 (NEEDS WEEKLY CHECKS)  Coumadin Clinic # [phone].

## 2023-06-22 LAB — BASIC METABOLIC PANEL
BUN/Creatinine Ratio: 17 (ref 12–28)
BUN: 24 mg/dL (ref 8–27)
CO2: 26 mmol/L (ref 20–29)
Calcium: 9.9 mg/dL (ref 8.7–10.3)
Chloride: 103 mmol/L (ref 96–106)
Creatinine, Ser: 1.42 mg/dL — ABNORMAL HIGH (ref 0.57–1.00)
Glucose: 83 mg/dL (ref 70–99)
Potassium: 4.6 mmol/L (ref 3.5–5.2)
Sodium: 142 mmol/L (ref 134–144)
eGFR: 42 mL/min/{1.73_m2} — ABNORMAL LOW (ref >59)

## 2023-06-22 LAB — CBC
Hematocrit: 41.6 % (ref 34.0–46.6)
Hemoglobin: 13.5 g/dL (ref 11.1–15.9)
MCH: 31.3 pg (ref 26.6–33.0)
MCHC: 32.5 g/dL (ref 31.5–35.7)
MCV: 96 fL (ref 79–97)
Platelets: 199 10*3/uL (ref 150–450)
RBC: 4.32 x10E6/uL (ref 3.77–5.28)
RDW: 13.5 % (ref 11.7–15.4)
WBC: 7.2 10*3/uL (ref 3.4–10.8)

## 2023-06-28 ENCOUNTER — Ambulatory Visit: Payer: Medicare Other | Attending: Cardiology

## 2023-06-28 DIAGNOSIS — Z5181 Encounter for therapeutic drug level monitoring: Secondary | ICD-10-CM | POA: Diagnosis present

## 2023-06-28 DIAGNOSIS — I480 Paroxysmal atrial fibrillation: Secondary | ICD-10-CM

## 2023-06-28 LAB — POCT INR: INR: 4.1 — AB (ref 2.0–3.0)

## 2023-06-28 NOTE — Patient Instructions (Signed)
Description   Eat a serving of greens today and continue taking Warfarin 1/2 tablet every day except 1 tablet on Wednesdays.  Recheck INR in 1 week post procedure  Coumadin Clinic # [phone].

## 2023-07-01 NOTE — Progress Notes (Signed)
 Spoke to pt and instructed them to come at 0730 and to be NPO after 0000.  Confirmed no missed doses of AC and instructed to take in AM with a small sip of water.  Confirmed that pt will have a ride home and someone to stay with them for 24 hours after the procedure. Instructed patient to not wear any jewelry or lotion.

## 2023-07-03 NOTE — Anesthesia Preprocedure Evaluation (Signed)
Anesthesia Evaluation  Patient identified by MRN, date of birth, ID band Patient awake    Reviewed: Allergy & Precautions, Patient's Chart, lab work & pertinent test results, reviewed documented beta blocker date and time   Airway Mallampati: III  TM Distance: >3 FB Neck ROM: Full    Dental  (+) Edentulous Upper, Missing, Dental Advisory Given   Pulmonary asthma (last used inhalers yesterday) , sleep apnea (2LPM [place] at home 24/7) and Oxygen sleep apnea , former smoker   Pulmonary exam normal breath sounds clear to auscultation       Cardiovascular hypertension (122/79 preop), Pt. on home beta blockers and Pt. on medications +CHF  + dysrhythmias (coumadin last night) Atrial Fibrillation + Valvular Problems/Murmurs (s/p AVR, MVR 2016)  Rhythm:Irregular Rate:Normal  TTE 2024 1. Left ventricular ejection fraction, by estimation, is 65 to 70%. The  left ventricle has normal function. The left ventricle has no regional  wall motion abnormalities. There is mild left ventricular hypertrophy.  Left ventricular diastolic parameters  are indeterminate.   2. Right ventricular systolic function is moderately reduced. The right  ventricular size is normal. There is severely elevated pulmonary artery  systolic pressure. The estimated right ventricular systolic pressure is  78.7 mmHg.   3. Left atrial size was mildly dilated.   4. Right atrial size was mildly dilated.   5. The mitral valve has been repaired/replaced. Trivial mitral valve  regurgitation. The mean mitral valve gradient is 5.3 mmHg with average  heart rate of 80 bpm. There is a 25 mm [employer] mechanical valve present  in the mitral position. Procedure Date:  07/2014. Echo findings are consistent with normal structure and function of  the mitral valve prosthesis. EOA 2.2, PHT 109 ms, E vel 1.86 m/s, DVI 1.4   6. Tricuspid valve regurgitation is mild to moderate.   7. The aortic valve  has been repaired/replaced. There is a 19 mm [employer]  mechanical valve present in the aortic position. Procedure Date: 07/2014.  Aortic valve area, by VTI measures 1.22 cm. Aortic valve mean gradient  measures 15.7 mmHg. Aortic valve  Vmax measures 2.85 m/s. Aortic valve acceleration time measures 82 msec,  DVI 0.39, EOA 1.22 cm2, iEOA 0.55 cm2/m2. Probable element of prosthesis  patient mismatch.   8. The inferior vena cava is normal in size with greater than 50%  respiratory variability, suggesting right atrial pressure of 3 mmHg.      Neuro/Psych  PSYCHIATRIC DISORDERS Anxiety Depression    negative neurological ROS     GI/Hepatic negative GI ROS, Neg liver ROS,,,  Endo/Other    Class 3 obesity (BMI 42)  Renal/GU Renal InsufficiencyRenal disease  negative genitourinary   Musculoskeletal negative musculoskeletal ROS (+)    Abdominal  (+) + obese  Peds  Hematology  (+) Blood dyscrasia (coumadin), anemia   Anesthesia Other Findings   Reproductive/Obstetrics                             Anesthesia Physical Anesthesia Plan  ASA: 4  Anesthesia Plan: General   Post-op Pain Management:    Induction: Intravenous  PONV Risk Score and Plan: 3 and Propofol infusion and Treatment may vary due to age or medical condition  Airway Management Planned: Natural Airway  Additional Equipment:   Intra-op Plan:   Post-operative Plan:   Informed Consent: I have reviewed the patients History and Physical, chart, labs and discussed the procedure including  the risks, benefits and alternatives for the proposed anesthesia with the patient or authorized representative who has indicated his/her understanding and acceptance.     Dental advisory given  Plan Discussed with: CRNA  Anesthesia Plan Comments:        Anesthesia Quick Evaluation

## 2023-07-04 ENCOUNTER — Ambulatory Visit (HOSPITAL_COMMUNITY)
Admission: RE | Admit: 2023-07-04 | Discharge: 2023-07-04 | Disposition: A | Discharge status: Home / Self Care | Payer: Medicare Other | Attending: Cardiovascular Disease | Admitting: Cardiovascular Disease

## 2023-07-04 ENCOUNTER — Encounter (HOSPITAL_COMMUNITY)
Admission: RE | Disposition: A | Discharge status: Home / Self Care | Payer: Medicare Other | Source: Home / Self Care | Attending: Cardiovascular Disease

## 2023-07-04 ENCOUNTER — Ambulatory Visit (HOSPITAL_COMMUNITY): Payer: Self-pay | Admitting: Anesthesiology

## 2023-07-04 ENCOUNTER — Other Ambulatory Visit: Payer: Self-pay

## 2023-07-04 ENCOUNTER — Encounter (HOSPITAL_COMMUNITY): Payer: Self-pay | Admitting: Cardiovascular Disease

## 2023-07-04 DIAGNOSIS — N183 Chronic kidney disease, stage 3 unspecified: Secondary | ICD-10-CM

## 2023-07-04 DIAGNOSIS — I5030 Unspecified diastolic (congestive) heart failure: Secondary | ICD-10-CM | POA: Diagnosis not present

## 2023-07-04 DIAGNOSIS — I130 Hypertensive heart and chronic kidney disease with heart failure and stage 1 through stage 4 chronic kidney disease, or unspecified chronic kidney disease: Secondary | ICD-10-CM

## 2023-07-04 DIAGNOSIS — Z9981 Dependence on supplemental oxygen: Secondary | ICD-10-CM | POA: Insufficient documentation

## 2023-07-04 DIAGNOSIS — J9610 Chronic respiratory failure, unspecified whether with hypoxia or hypercapnia: Secondary | ICD-10-CM | POA: Insufficient documentation

## 2023-07-04 DIAGNOSIS — Z9229 Personal history of other drug therapy: Secondary | ICD-10-CM

## 2023-07-04 DIAGNOSIS — Z8679 Personal history of other diseases of the circulatory system: Secondary | ICD-10-CM

## 2023-07-04 DIAGNOSIS — N1832 Chronic kidney disease, stage 3b: Secondary | ICD-10-CM | POA: Insufficient documentation

## 2023-07-04 DIAGNOSIS — E66813 Obesity, class 3: Secondary | ICD-10-CM | POA: Diagnosis not present

## 2023-07-04 DIAGNOSIS — Z6841 Body Mass Index (BMI) 40.0 and over, adult: Secondary | ICD-10-CM | POA: Diagnosis not present

## 2023-07-04 DIAGNOSIS — I272 Pulmonary hypertension, unspecified: Secondary | ICD-10-CM | POA: Insufficient documentation

## 2023-07-04 DIAGNOSIS — I4892 Unspecified atrial flutter: Secondary | ICD-10-CM

## 2023-07-04 DIAGNOSIS — G4730 Sleep apnea, unspecified: Secondary | ICD-10-CM | POA: Insufficient documentation

## 2023-07-04 DIAGNOSIS — I5032 Chronic diastolic (congestive) heart failure: Secondary | ICD-10-CM | POA: Diagnosis not present

## 2023-07-04 DIAGNOSIS — Z952 Presence of prosthetic heart valve: Secondary | ICD-10-CM | POA: Insufficient documentation

## 2023-07-04 DIAGNOSIS — I480 Paroxysmal atrial fibrillation: Secondary | ICD-10-CM | POA: Diagnosis present

## 2023-07-04 DIAGNOSIS — J849 Interstitial pulmonary disease, unspecified: Secondary | ICD-10-CM | POA: Diagnosis not present

## 2023-07-04 DIAGNOSIS — Z79899 Other long term (current) drug therapy: Secondary | ICD-10-CM | POA: Diagnosis not present

## 2023-07-04 HISTORY — PX: CARDIOVERSION: EP1203

## 2023-07-04 LAB — PROTIME-INR
INR: 3.4 — ABNORMAL HIGH (ref 0.8–1.2)
Prothrombin Time: 34.3 s — ABNORMAL HIGH (ref 11.4–15.2)

## 2023-07-04 SURGERY — CARDIOVERSION (CATH LAB)
Anesthesia: General

## 2023-07-04 MED ORDER — LIDOCAINE 2% (20 MG/ML) 5 ML SYRINGE
INTRAMUSCULAR | Status: DC | PRN
  Administered 2023-07-04: 60 mg via INTRAVENOUS

## 2023-07-04 MED ORDER — PROPOFOL 10 MG/ML IV BOLUS
INTRAVENOUS | Status: DC | PRN
  Administered 2023-07-04: 50 mg via INTRAVENOUS

## 2023-07-04 MED ORDER — SODIUM CHLORIDE 0.9% FLUSH: 3.0000 mL | Freq: Two times a day (BID) | INTRAVENOUS | Status: DC

## 2023-07-04 MED ORDER — SODIUM CHLORIDE 0.9% FLUSH: 3.0000 mL | INTRAVENOUS | Status: DC | PRN

## 2023-07-04 SURGICAL SUPPLY — 1 items: PAD DEFIB RADIO PHYSIO CONN (PAD) ×1 IMPLANT

## 2023-07-04 NOTE — CV Procedure (Signed)
   DIRECT CURRENT CARDIOVERSION  NAME:  [name]    MRN: [id number] DOB:  26-Sep-1960    ADMIT DATE: 07/04/2023  Indication:  Symptomatic atrial flutter  Procedure Note:  The patient signed informed consent.  They have had had therapeutic anticoagulation with warfarin greater than 3 weeks.  Anesthesia was administered by Dr. Tyrell Antonio.  Adequate airway was maintained throughout and vital followed per protocol.  They were cardioverted x 1 with 200J of biphasic synchronized energy.  They converted to NSR.  There were no apparent complications.  The patient had normal neuro status and respiratory status post procedure with vitals stable as recorded elsewhere.    Follow up: They will continue on current medical therapy and follow up with cardiology as scheduled.  Gerri Spore T. Flora Lipps, MD, [facility]  [facility]  [address], [address] [place], [place] [address] [phone]  8:57 AM

## 2023-07-04 NOTE — Anesthesia Postprocedure Evaluation (Signed)
Anesthesia Post Note  Patient: [name]  Procedure(s) Performed: CARDIOVERSION     Patient location during evaluation: PACU Anesthesia Type: General Level of consciousness: awake and alert, oriented and patient cooperative Pain management: pain level controlled Vital Signs Assessment: post-procedure vital signs reviewed and stable Respiratory status: spontaneous breathing, nonlabored ventilation and respiratory function stable Cardiovascular status: blood pressure returned to baseline and stable Postop Assessment: no apparent nausea or vomiting Anesthetic complications: no   No notable events documented.  Last Vitals:  Vitals:   07/04/23 0905 07/04/23 0910  BP: 110/74   Pulse: (!) 52   Resp: 20   Temp:    SpO2: 92% 92%    Last Pain:  Vitals:   07/04/23 0910  TempSrc:   PainSc: 0-No pain                 Lannie Fields

## 2023-07-04 NOTE — Interval H&P Note (Signed)
History and Physical Interval Note:  07/04/2023 7:38 AM  [name]  has presented today for surgery, with the diagnosis of AFIB.  The various methods of treatment have been discussed with the patient and family. After consideration of risks, benefits and other options for treatment, the patient has consented to  Procedure(s): CARDIOVERSION (N/A) as a surgical intervention.  The patient's history has been reviewed, patient examined, no change in status, stable for surgery.  I have reviewed the patient's chart and labs.  Questions were answered to the patient's satisfaction.     NPO for DCCV. On warfarin. INR 4.1.  Gerri Spore T. Flora Lipps, MD, [facility]  [facility]  [address], [address] [place], [place] [address] [phone]  7:39 AM

## 2023-07-04 NOTE — Transfer of Care (Signed)
Immediate Anesthesia Transfer of Care Note  Patient: [name]  Procedure(s) Performed: CARDIOVERSION  Patient Location: Cath Lab  Anesthesia Type:General  Level of Consciousness: drowsy and patient cooperative  Airway & Oxygen Therapy: Patient Spontanous Breathing and Patient connected to nasal cannula oxygen  Post-op Assessment: Report given to RN, Post -op Vital signs reviewed and stable, and Patient moving all extremities X 4  Post vital signs: Reviewed and stable  Last Vitals:  Vitals Value Taken Time  BP 127/75   Temp    Pulse 53   Resp 14   SpO2 90     Last Pain:  Vitals:   07/04/23 0759  TempSrc:   PainSc: 0-No pain         Complications: No notable events documented.

## 2023-07-05 ENCOUNTER — Encounter (HOSPITAL_COMMUNITY): Payer: Self-pay | Admitting: Cardiovascular Disease

## 2023-07-12 ENCOUNTER — Ambulatory Visit: Payer: Medicare Other

## 2023-07-18 NOTE — Progress Notes (Unsigned)
Cardiology Office Note:    Date:  07/20/2023  ID:  [name], DOB [birth year], MRN [id number] PCP: Leonard Downing  [facility] Providers Cardiologist:  Christell Constant, MD       Patient Profile:      [name], mechanical MVR (Dr. Silvestre Mesi, [facility]) in 07/2014 No CAD on cath prior to surgery Patient-Prosthesis Mismatch TTE 05/04/2021: EF 55-60, no RWMA, normal RVSF, severely elevated PASP, moderate LAE, mechanical MVR with mean gradient 6, moderate TR, mechanical AVR with mean gradient 15, RAP 8 TTE 02/10/2023: EF 65-70, no RWMA, mild LVH, moderately reduced RVSF, severe RVSP 78.7 mild BAE, mechanical MVR with trivial MR, mean 5.3 mmHg normal structure and function, mild-moderate TR, mechanical AVR with mean gradient 15.7 mmHg, probable element of prosthesis patient mismatch, RAP 3  Chronic hypoxic respiratory failure Pulm: Dr. 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 Silvestre [facility]i at [facility] 2022 - conservative mgmt Paroxysmal atrial fibrillation  Atrial flutter Monitor 03/2023: avg HR 70 (HFpEF) heart failure with preserved ejection fraction  Chronic coumadin Rx Chronic kidney disease  Hypertension  Hyperlipidemia  Hereditary hemachromatosis  OSA Obesity s/p bariatric surgery       Discussed the use of AI scribe software for clinical note transcription with the patient, who gave verbal consent to proceed. History of Present Illness [name] is a 63 year old female with (HFpEF) heart failure with preserved ejection fraction, atrial fibrillation, mechanical aortic and mitral valve  replacements who presents for follow-up after recent DCCV.  She is currently maintaining sinus rhythm with a long first-degree AV block and borderline QT prolongation on EKG today. She feels 'a little better' since the cardioversion, with no chest pain, syncope, or leg swelling. 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 She does not weigh herself daily and sleeps in a recliner. No bleeding, black stools, or hematuria. She reports a recent cold, for which she visited urgent care and was prescribed amoxicillin and breathing treatments. She notes some improvement but still experiences wheezing.   ROS-See HPI    Studies Reviewed:   EKG Interpretation Date/Time:  Wednesday July 20 2023 10:01:10 EST Ventricular Rate:  60 PR Interval:  288 QRS Duration:  106 QT Interval:  480 QTC Calculation: 480 R Axis:   76  Text Interpretation: Sinus rhythm with 1st degree A-V block Nonspecific ST abnormality Prolonged QT Confirmed by Tereso Newcomer [phone]) on 07/20/2023 10:27:05 AM     Risk Assessment/Calculations:    CHA2DS2-VASc Score = 3   This indicates a 3.2% annual risk of stroke. The patient's score is based upon: CHF History: 1 HTN History: 1 Diabetes History: 0 Stroke History: 0 Vascular Disease History: 0 Age Score: 0 Gender Score: 1            Physical Exam:   VS:  BP 126/70   Pulse 60   Ht 5' 6.5" (1.689 m)   Wt 255 lb 6.4 oz (115.8 kg)   SpO2 92%   BMI 40.61 kg/m    Wt Readings from Last 3 Encounters:  07/20/23 255 lb 6.4 oz (115.8 kg)  07/04/23 260 lb (117.9  kg)  06/08/23 261 lb 12.8 oz (118.8 kg)    Constitutional:      Appearance: Healthy appearance. Not in distress.  Pulmonary:     Effort: Pulmonary effort is normal.     Breath sounds: No wheezing. No rales.  Cardiovascular:     Normal rate. Irregular rhythm. S1. Mechanical S2. Mechanical      Murmurs: There is a grade 2/6 systolic murmur at the URSB and ULSB.  Edema:    Peripheral edema present.    Pretibial:  2+ non-pitting edema of the right pretibial area.    Ankle: 2+ non-pitting edema of the right ankle. Abdominal:     Palpations: Abdomen is soft.  Skin:    General: Skin is warm and dry.      Assessment and Plan:   Assessment & Plan Typical atrial flutter (HCC) Status post cardioversion 07/04/2023.  She is maintaining sinus rhythm.  She does have a long first-degree AV block.  She only takes metoprolol tartrate 50 mg once a day.  I will change metoprolol to tartrate to 25 mg twice daily.  Continue Coumadin as directed by our Coumadin clinic.  Follow-up 6 months. Chronic heart failure with preserved ejection fraction (HCC) NYHA III. She has chronic respiratory failure and is on chronic O2. 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  Volume status seems to be stable. -Continue furosemide 60 mg twice daily -Continue K+ 20 mEq twice daily -Follow-up 6 months Valvular heart disease History of mechanical AVR and mechanical MVR by Dr. Silvestre Mesi at [facility] in March 2016.  Echocardiogram in September 2024 with normal EF, mild LVH, moderately reduced RVSF, severely elevated RVSP, mechanical MVR with trivial MR and mean gradient 5.3, mechanical AVR with mean gradient 15.7 mmHg and probable element of patient prosthesis mismatch. She was previously evaluated by Dr. Silvestre Mesi again in the setting of acquired hemolytic anemia.  He did not feel that she would be a candidate for redo valve surgery and has recommended conservative approach. -Continue SBE prophylaxis -She has follow-up echocardiogram pending in August 2025 Essential hypertension Blood pressure controlled -Change metoprolol tartrate to 25 mg twice daily as noted above Stage 3b chronic kidney disease (HCC) Recent creatinine stable.      Dispo:  Return in about 6 months (around 01/17/2024) for Routine Follow Up, w/ Dr. Izora Ribas.  Signed, Tereso Newcomer, PA-C

## 2023-07-19 ENCOUNTER — Ambulatory Visit: Payer: Medicare Other | Attending: Cardiology

## 2023-07-19 DIAGNOSIS — Z5181 Encounter for therapeutic drug level monitoring: Secondary | ICD-10-CM

## 2023-07-19 DIAGNOSIS — I38 Endocarditis, valve unspecified: Secondary | ICD-10-CM | POA: Insufficient documentation

## 2023-07-19 DIAGNOSIS — I480 Paroxysmal atrial fibrillation: Secondary | ICD-10-CM

## 2023-07-19 DIAGNOSIS — N1832 Chronic kidney disease, stage 3b: Secondary | ICD-10-CM | POA: Insufficient documentation

## 2023-07-19 DIAGNOSIS — I5032 Chronic diastolic (congestive) heart failure: Secondary | ICD-10-CM | POA: Diagnosis present

## 2023-07-19 DIAGNOSIS — I483 Typical atrial flutter: Secondary | ICD-10-CM | POA: Diagnosis present

## 2023-07-19 DIAGNOSIS — I10 Essential (primary) hypertension: Secondary | ICD-10-CM | POA: Insufficient documentation

## 2023-07-19 LAB — POCT INR: INR: 3.5 — AB (ref 2.0–3.0)

## 2023-07-19 NOTE — Patient Instructions (Signed)
 Description   Continue taking Warfarin 1/2 tablet every day except 1 tablet on Wednesdays.  Recheck INR in 3 weeks.  Coumadin Clinic # [phone].

## 2023-07-20 ENCOUNTER — Ambulatory Visit (INDEPENDENT_AMBULATORY_CARE_PROVIDER_SITE_OTHER): Payer: Medicare Other | Admitting: Physician Assistant

## 2023-07-20 ENCOUNTER — Encounter: Payer: Self-pay | Admitting: Physician Assistant

## 2023-07-20 VITALS — BP 126/70 | HR 60 | Ht 66.5 in | Wt 255.4 lb

## 2023-07-20 DIAGNOSIS — I5032 Chronic diastolic (congestive) heart failure: Secondary | ICD-10-CM

## 2023-07-20 DIAGNOSIS — I10 Essential (primary) hypertension: Secondary | ICD-10-CM | POA: Diagnosis not present

## 2023-07-20 DIAGNOSIS — N1832 Chronic kidney disease, stage 3b: Secondary | ICD-10-CM

## 2023-07-20 DIAGNOSIS — I38 Endocarditis, valve unspecified: Secondary | ICD-10-CM

## 2023-07-20 DIAGNOSIS — I483 Typical atrial flutter: Secondary | ICD-10-CM

## 2023-07-20 DIAGNOSIS — I480 Paroxysmal atrial fibrillation: Secondary | ICD-10-CM | POA: Diagnosis not present

## 2023-07-20 MED ORDER — METOPROLOL TARTRATE 25 MG PO TABS
25.0000 mg | ORAL_TABLET | Freq: Two times a day (BID) | ORAL | 3 refills | Status: DC
Start: 2023-07-20 — End: 2023-11-07

## 2023-07-20 NOTE — Patient Instructions (Signed)
Medication Instructions:  Your physician has recommended you make the following change in your medication:   CHANGE the Metoprolol to 25 mg taking 1 twice a day  *If you need a refill on your cardiac medications before your next appointment, please call your pharmacy*   Lab Work: None ordered  If you have labs (blood work) drawn today and your tests are completely normal, you will receive your results only by: MyChart Message (if you have MyChart) OR A paper copy in the mail If you have any lab test that is abnormal or we need to change your treatment, we will call you to review the results.   Testing/Procedures: None ordered today   Follow-Up: At [facility], you and your health needs are our priority.  As part of our continuing mission to provide you with exceptional heart care, we have created designated Provider Care Teams.  These Care Teams include your primary Cardiologist (physician) and Advanced Practice Providers (APPs -  Physician Assistants and Nurse Practitioners) who all work together to provide you with the care you need, when you need it.  We recommend signing up for the patient portal called "MyChart".  Sign up information is provided on this After Visit Summary.  MyChart is used to connect with patients for Virtual Visits (Telemedicine).  Patients are able to view lab/test results, encounter notes, upcoming appointments, etc.  Non-urgent messages can be sent to your provider as well.   To learn more about what you can do with MyChart, go to [email or web address].    Your next appointment:   6 month(s)  Provider:   Christell Constant, MD     Other Instructions     1st Floor: - Lobby - Registration  - Pharmacy  - Lab - Cafe  2nd Floor: - PV Lab - Diagnostic Testing (echo, CT, nuclear med)  3rd Floor: - Vacant  4th Floor: - TCTS (cardiothoracic surgery) - AFib Clinic - Structural Heart Clinic - Vascular Surgery  - Vascular  Ultrasound  5th Floor: - [facility] Cardiology (general and EP) - Clinical Pharmacy for coumadin, hypertension, lipid, weight-loss medications, and med management appointments    Valet parking services will be available as well.

## 2023-07-20 NOTE — Assessment & Plan Note (Signed)
History of mechanical AVR and mechanical MVR by Dr. Silvestre Mesi at [facility] in March 2016.  Echocardiogram in September 2024 with normal EF, mild LVH, moderately reduced RVSF, severely elevated RVSP, mechanical MVR with trivial MR and mean gradient 5.3, mechanical AVR with mean gradient 15.7 mmHg and probable element of patient prosthesis mismatch. She was previously evaluated by Dr. Silvestre Mesi again in the setting of acquired hemolytic anemia.  He did not feel that she would be a candidate for redo valve surgery and has recommended conservative approach. -Continue SBE prophylaxis -She has follow-up echocardiogram pending in August 2025

## 2023-07-20 NOTE — Assessment & Plan Note (Signed)
Recent creatinine stable. 

## 2023-07-20 NOTE — Assessment & Plan Note (Signed)
Blood pressure controlled -Change metoprolol tartrate to 25 mg twice daily as noted above

## 2023-07-20 NOTE — Assessment & Plan Note (Signed)
NYHA III. She has chronic respiratory failure and is on chronic O2. 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  Volume status seems to be stable. -Continue furosemide 60 mg twice daily -Continue K+ 20 mEq twice daily -Follow-up 6 months

## 2023-07-20 NOTE — Assessment & Plan Note (Addendum)
Status post cardioversion 07/04/2023.  She is maintaining sinus rhythm.  She does have a long first-degree AV block.  She only takes metoprolol tartrate 50 mg once a day.  I will change metoprolol to tartrate to 25 mg twice daily.  Continue Coumadin as directed by our Coumadin clinic.  Follow-up 6 months.

## 2023-08-01 ENCOUNTER — Other Ambulatory Visit: Payer: Self-pay

## 2023-08-02 ENCOUNTER — Encounter: Payer: Self-pay | Admitting: Pulmonary Disease

## 2023-08-02 ENCOUNTER — Ambulatory Visit: Payer: Medicare Other | Admitting: Pulmonary Disease

## 2023-08-02 VITALS — BP 132/70 | HR 60 | Ht 66.0 in | Wt 261.2 lb

## 2023-08-02 DIAGNOSIS — Z87891 Personal history of nicotine dependence: Secondary | ICD-10-CM | POA: Diagnosis not present

## 2023-08-02 DIAGNOSIS — G4719 Other hypersomnia: Secondary | ICD-10-CM | POA: Diagnosis not present

## 2023-08-02 DIAGNOSIS — E8770 Fluid overload, unspecified: Secondary | ICD-10-CM | POA: Diagnosis not present

## 2023-08-02 DIAGNOSIS — J9611 Chronic respiratory failure with hypoxia: Secondary | ICD-10-CM | POA: Diagnosis not present
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
## 2023-08-02 NOTE — Patient Instructions (Signed)
 Nice to see you again  No changes in medication  I ordered a split-night study to evaluate for sleep apnea, this will need to be done "in lab" and to use oxygen  Return to clinic in 6 months or sooner as needed with Dr. Judeth Horn

## 2023-08-03 ENCOUNTER — Other Ambulatory Visit: Payer: Self-pay | Admitting: Internal Medicine

## 2023-08-03 DIAGNOSIS — I480 Paroxysmal atrial fibrillation: Secondary | ICD-10-CM

## 2023-08-09 ENCOUNTER — Ambulatory Visit: Payer: Medicare Other | Attending: Cardiology

## 2023-08-09 DIAGNOSIS — I480 Paroxysmal atrial fibrillation: Secondary | ICD-10-CM | POA: Diagnosis not present

## 2023-08-09 DIAGNOSIS — Z5181 Encounter for therapeutic drug level monitoring: Secondary | ICD-10-CM | POA: Insufficient documentation

## 2023-08-09 LAB — POCT INR: INR: 3.7 — AB (ref 2.0–3.0)

## 2023-08-09 NOTE — Patient Instructions (Signed)
Description   HOLD today's dose and then continue taking Warfarin 1/2 tablet every day except 1 tablet on Wednesdays.  Recheck INR in 3 weeks.  Coumadin Clinic # [phone].

## 2023-08-19 ENCOUNTER — Encounter (HOSPITAL_COMMUNITY): Payer: Self-pay | Admitting: *Deleted

## 2023-08-30 ENCOUNTER — Ambulatory Visit: Attending: Cardiology

## 2023-08-30 DIAGNOSIS — Z5181 Encounter for therapeutic drug level monitoring: Secondary | ICD-10-CM | POA: Insufficient documentation

## 2023-08-30 DIAGNOSIS — I480 Paroxysmal atrial fibrillation: Secondary | ICD-10-CM | POA: Insufficient documentation

## 2023-08-30 LAB — POCT INR: INR: 3.6 — AB (ref 2.0–3.0)

## 2023-08-30 NOTE — Patient Instructions (Signed)
 Description   Eat a serving of greens today and continue taking Warfarin 1/2 tablet every day except 1 tablet on Wednesdays.  Recheck INR in 3 weeks  Coumadin Clinic # [phone]

## 2023-09-06 ENCOUNTER — Other Ambulatory Visit: Payer: Self-pay | Admitting: Pulmonary Disease

## 2023-09-13 ENCOUNTER — Other Ambulatory Visit: Payer: Self-pay | Admitting: Physician Assistant

## 2023-09-20 ENCOUNTER — Ambulatory Visit: Attending: Cardiology

## 2023-09-20 DIAGNOSIS — Z5181 Encounter for therapeutic drug level monitoring: Secondary | ICD-10-CM | POA: Diagnosis present

## 2023-09-20 DIAGNOSIS — I480 Paroxysmal atrial fibrillation: Secondary | ICD-10-CM | POA: Diagnosis present

## 2023-09-20 LAB — POCT INR: INR: 3.6 — AB (ref 2.0–3.0)

## 2023-09-20 NOTE — Patient Instructions (Signed)
 Description   START taking Warfarin 1/2 tablet every day.   Recheck INR in 3 weeks  Coumadin  Clinic # [phone]

## 2023-09-30 ENCOUNTER — Ambulatory Visit (HOSPITAL_BASED_OUTPATIENT_CLINIC_OR_DEPARTMENT_OTHER): Attending: Pulmonary Disease | Admitting: Internal Medicine

## 2023-09-30 DIAGNOSIS — G4736 Sleep related hypoventilation in conditions classified elsewhere: Secondary | ICD-10-CM | POA: Insufficient documentation

## 2023-09-30 DIAGNOSIS — G4719 Other hypersomnia: Secondary | ICD-10-CM | POA: Insufficient documentation

## 2023-10-08 DIAGNOSIS — G4719 Other hypersomnia: Secondary | ICD-10-CM

## 2023-10-08 NOTE — Procedures (Signed)
 [facility] [address] [place], [place] [address] Tel: [phone]   Fax: [phone]  Polysomnography Interpretation  Patient Name:  Decie, Babayev Date:  09/30/2023 Referring Physician:  Orbie Binder, MD  Indications for Polysomnography The patient is a 63 year-old Female who is 5\' 6"  and weighs 261.0 lbs. Her BMI equals 42.4.  A full night polysomnogram was performed to evaluate for -excessive somnolence.  Medication  None reported   Polysomnogram Data A full night polysomnogram recorded the standard physiologic parameters including EEG, EOG, EMG, EKG, nasal and oral airflow.  Respiratory parameters of chest and abdominal movements were recorded with Respiratory Inductance Plethysmography belts.  Oxygen  saturation was recorded by pulse oximetry.   Sleep Architecture The total recording time of the polysomnogram was 381.5 minutes.  The total sleep time was 302.0 minutes.  The patient spent 7.8% of total sleep time in Stage N1, 54.8% in Stage N2, 7.5% in Stages N3, and 30.0% in REM.  Sleep latency was 24.9 minutes.  REM latency was 89.5 minutes.  Sleep Efficiency was 79.2%.  Wake after Sleep Onset time was 55.0 minutes.  Respiratory Events The polysomnogram revealed a presence of - obstructive, 5 central, and - mixed apneas resulting in an Apnea index of 1.0 events per hour.  There were 42 hypopneas (>=3% desaturation and/or arousal) resulting in an Apnea\Hypopnea Index (AHI >=3% desaturation and/or arousal) of 9.3 events per hour.  There were 13 hypopneas (>=4% desaturation) resulting in an Apnea\Hypopnea Index (AHI >=4% desaturation) of 3.6 events per hour.  There were 11 Respiratory Effort Related Arousals resulting in a RERA index of 2.2 events per hour. The Respiratory Disturbance Index is 11.5 events per hour.  The snore index was 0.2 events per hour.  Mean oxygen  saturation was 90.9%.  The lowest oxygen  saturation during  sleep was 86.0%.  Time spent <=88% oxygen  saturation was 23.9 minutes (6.4%). Patient arrived on O2 3L and wore O2 at 3L throughout the study, per protocol.  End Tidal CO2 during sleep ranged from - to - mmHg. End Tidal CO2 was greater than 50 mmHg for - minutes and greater than 55 mmHg for - minutes.  Limb Activity There were 151 total limb movements recorded, of this total, 151 were classified as PLMs.  PLM index was 30.0 per hour and PLM associated with Arousals index was 1.4 per hour.  Cardiac Summary The average pulse rate was 58.7 bpm.  The minimum pulse rate was 44.0 bpm while the maximum pulse rate was 87.0 bpm.  Cardiac rhythm was normal with occasional PVCs.  Comment: Occasional obstructive apneas, within normal limits, AHI (4%) 3.6/hr. Some snoring and occasional coughing noted, with oxygen  desaturation to a nadir of 86%, mean 90.9% and time with O2 saturation 88% or less was 23.9 minutes while wearing O2 3L by nasal prongs.  Diagnosis: Nocturnal Hypoxemia  Recommendations: Consider if supplemental O2 is currently sufficient.   This study was personally reviewed and electronically signed by: [employer], MD Accredited Board Certified in Sleep Medicine Date/Time: 10/08/23   12:01        Diagnostic PSG Report  Patient Name: Kadra, Dimond Date: 09/30/2023  Date of Birth: 07/22/1960 Study Type: Diagnostic  Age: 89 year MRN #: [id number]  Sex: Female Interpreting Physician: [employer] [id number]  Height: 5\' 6"  Referring Physician: Orbie Binder, MD  Weight: 261.0 lbs Recording Tech: Alice Ao RPSGT RST  BMI: 42.4 Scoring Tech: Alice Ao RPSGT RST  ESS: 11 Neck Size: 16   Study Overview  Lights Off: 10:53:28 PM  Count Index  Lights On: 05:14:58 AM Awakenings: 11 2.2  Time in Bed: 381.5 min. Arousals: 73 14.5  Total Sleep Time: 302.0 min. AHI (>=3% Desat and/or Ar.): 47 9.3   Sleep Efficiency: 79.2% AHI (>=4% Desat): 18 3.6   Sleep Latency:  24.9 min. Limb Movements: 151 30.0  Wake After Sleep Onset: 55.0 min. Snore: 1 0.2  REM Latency from Sleep Onset: 89.5 min. Desaturations: 49 9.7     Minimum SpO2 TST: 86.0%    Sleep Architecture  % of Time in Bed Stages Time (mins) % Sleep Time  Wake 80.0   Stage N1 23.5 7.8%  Stage N2 165.5 54.8%  Stage N3 22.5 7.5%  REM 90.5 30.0%   Arousal Summary   NREM REM Sleep Index  Respiratory Arousals 10 4 14  2.8  PLM Arousals 7 - 7 1.4  Isolated Limb Movement Arousals - - - -  Snore Arousals - - - -  Spontaneous Arousals 36 16 52 10.3  Total 53 20 73 14.5   Limb Movement Summary   Count Index  Isolated Limb Movements - -  Periodic Limb Movements (PLMs) 151 30.0  Total Limb Movements 151 30.0    Respiratory Summary   By Sleep Stage By Body Position Total   NREM REM Supine Non-Supine   Time (min) 211.5 90.5 108.0 194.0 302.0         Obstructive Apnea - - - - -  Mixed Apnea - - - - -  Central Apnea 5 - 1 4 5   Total Apneas 5 - 1 4 5   Total Apnea Index 1.4 - 0.6 1.2 1.0         Hypopneas (>=3% Desat and/or Ar.) 27 15 18 24  42  AHI (>=3% Desat and/or Ar.) 9.1 9.9 10.6 8.7 9.3         Hypopneas (>=4% Desat) 8 5 5 8 13   AHI (>=4% Desat) 3.7 3.3 3.3 3.7 3.6          RERAs 6 5 4 7 11   RERA Index 1.7 3.3 2.2 2.2 2.2         RDI 10.8 13.3 12.8 10.8 11.5    Respiratory Event Type Index  Central Apneas 1.0  Obstructive Apneas -  Mixed Apneas -  Central Hypopneas -  Obstructive Hypopneas 8.5  Central Apnea + Hypopnea (CAHI) 1.0  Obstructive Apnea + Hypopnea (OAHI) 8.5   Respiratory Event Durations   Apnea Hypopnea   NREM REM NREM REM  Average (seconds) 15.8 - 22.2 27.4  Maximum (seconds) 20.9 - 32.7 34.6    Oxygen  Saturation Summary   Wake NREM REM TST TIB  Average SpO2 (%) 91.8% 90.4% 91.1% 90.6% 90.9%  Minimum SpO2 (%) 88.0% 87.0% 86.0% 86.0% 86.0%  Maximum SpO2 (%) 98.0% 98.0% 98.0% 98.0% 98.0%   Oxygen  Saturation Distribution  Range (%) Time in range  (min) Time in range (%)  90.0 - 100.0 198.6 53.4%  80.0 - 90.0 173.3 46.6%  70.0 - 80.0 - -  60.0 - 70.0 - -  50.0 - 60.0 - -  0.0 - 50.0 - -  Time Spent <=88% SpO2  Range (%) Time in range (min) Time in range (%)  0.0 - 88.0 23.9 6.4%      Count Index  Desaturations 49 9.7    Cardiac Summary   Wake NREM REM Sleep Total  Average Pulse Rate (BPM) 58.8  59.0 58.2 58.7 58.7  Minimum Pulse Rate (BPM) 44.0 56.0 56.0 56.0 44.0  Maximum Pulse Rate (BPM) 87.0 64.0 63.0 64.0 87.0   Pulse Rate Distribution:  Range (bpm) Time in range (min) Time in range (%)  0.0 - 40.0 - -  40.0 - 60.0 338.6 91.0%  60.0 - 80.0 33.5 9.0%  80.0 - 100.0 0.0 0.0%  100.0 - 120.0 - -  120.0 - 140.0 - -  140.0 - 200.0 - -   EtCO2 Summary  Stage Min (mmHg) Average (mmHg) Max (mmHg)  Wake - - -  NREM(1+2+3) - - -  REM - - -   EtCO2 Distribution:  Range (mmHg) Time in range (min) Time in range (%)  20.0 - 40.0 - -  40.0 - 50.0 - -  50.0 - 100.0 - -  55.0 - 100.0 - -  Excluded data <20.0 & >65.0 382.0 100.0%     Hypnograms                         Technologist Comments  The 63 year old female was seen in the [facility] for a Split-Night order.  Patient indication was Excessive Daytime Sleepiness.  The patient arrived on 3.0 LPM and was on 3.0 LPM for entire time in [facility].  The patient was fitted for and desensitized on a [employer] Paykel Simplus FFM: size Small. She tolerated this mask well and it fit her well.   The patient did not meet split night criteria and the study was continued as a baseline NPSG.  The patient was observed to sleep supine and right lateral. Mild snores and moderate snort sounds were observed throughout the night.  Apneic and hypopneic events were noted. Events appeared more frequent during stage REM.  Occasional coughing noted throughout the night. ECG: Sinus Rhythm with occasional arrhythmias throughout the night. See hard copies for examples: Epochs:  497, 561, 563.  PLMs/PLMAs were occasional. The patient was up to use the restroom once during the night.  Oxygen : 3.0 LPM.                            [employer] Diplomate, American Board of Sleep Medicine  ELECTRONICALLY SIGNED ON:  10/08/2023, 11:53 AM [facility] PH: [phone]   FX: [phone] ACCREDITED BY THE AMERICAN ACADEMY OF SLEEP MEDICINE

## 2023-10-11 ENCOUNTER — Ambulatory Visit

## 2023-10-11 DIAGNOSIS — Z5181 Encounter for therapeutic drug level monitoring: Secondary | ICD-10-CM

## 2023-10-11 DIAGNOSIS — I480 Paroxysmal atrial fibrillation: Secondary | ICD-10-CM

## 2023-10-11 LAB — POCT INR: INR: 2.5 (ref 2.0–3.0)

## 2023-10-11 NOTE — Patient Instructions (Signed)
 Description   Take 1 tablet today and then continue taking Warfarin 1/2 tablet every day.   Recheck INR in 4 weeks  Coumadin  Clinic # [phone]

## 2023-11-07 ENCOUNTER — Other Ambulatory Visit: Payer: Self-pay

## 2023-11-07 MED ORDER — METOPROLOL TARTRATE 25 MG PO TABS: 25.0000 mg | ORAL_TABLET | Freq: Two times a day (BID) | ORAL | 2 refills | Status: AC

## 2023-11-08 ENCOUNTER — Ambulatory Visit

## 2023-11-08 DIAGNOSIS — Z5181 Encounter for therapeutic drug level monitoring: Secondary | ICD-10-CM | POA: Insufficient documentation

## 2023-11-08 DIAGNOSIS — I480 Paroxysmal atrial fibrillation: Secondary | ICD-10-CM | POA: Insufficient documentation

## 2023-11-08 LAB — POCT INR: INR: 2.6 (ref 2.0–3.0)

## 2023-11-08 NOTE — Patient Instructions (Signed)
 Description   Continue taking Warfarin 1/2 tablet every day.   Recheck INR in 5 weeks  Coumadin  Clinic # [phone]

## 2023-11-21 ENCOUNTER — Other Ambulatory Visit: Payer: Self-pay

## 2023-11-21 DIAGNOSIS — I480 Paroxysmal atrial fibrillation: Secondary | ICD-10-CM

## 2023-11-21 MED ORDER — WARFARIN SODIUM 7.5 MG PO TABS: ORAL_TABLET | ORAL | 1 refills | Status: DC

## 2023-12-13 ENCOUNTER — Ambulatory Visit: Attending: Cardiology

## 2023-12-13 DIAGNOSIS — I480 Paroxysmal atrial fibrillation: Secondary | ICD-10-CM | POA: Diagnosis present

## 2023-12-13 DIAGNOSIS — Z5181 Encounter for therapeutic drug level monitoring: Secondary | ICD-10-CM | POA: Insufficient documentation

## 2023-12-13 LAB — POCT INR: INR: 3 (ref 2.0–3.0)

## 2023-12-13 NOTE — Patient Instructions (Signed)
 Description   Continue taking Warfarin 1/2 tablet every day.   Recheck INR in 6 weeks  Coumadin  Clinic # [phone]

## 2023-12-13 NOTE — Progress Notes (Signed)
Please see anticoagulation encounter.

## 2023-12-18 ENCOUNTER — Other Ambulatory Visit: Payer: Self-pay | Admitting: Pulmonary Disease

## 2024-01-04 ENCOUNTER — Ambulatory Visit (HOSPITAL_COMMUNITY)
Admission: RE | Admit: 2024-01-04 | Discharge: 2024-01-04 | Disposition: A | Discharge status: Home / Self Care | Payer: Medicare Other | Source: Ambulatory Visit | Attending: Cardiovascular Disease | Admitting: Cardiovascular Disease

## 2024-01-04 DIAGNOSIS — Z952 Presence of prosthetic heart valve: Secondary | ICD-10-CM | POA: Insufficient documentation

## 2024-01-04 DIAGNOSIS — I272 Pulmonary hypertension, unspecified: Secondary | ICD-10-CM | POA: Insufficient documentation

## 2024-01-04 DIAGNOSIS — I480 Paroxysmal atrial fibrillation: Secondary | ICD-10-CM | POA: Diagnosis present

## 2024-01-04 DIAGNOSIS — I071 Rheumatic tricuspid insufficiency: Secondary | ICD-10-CM | POA: Insufficient documentation

## 2024-01-04 LAB — ECHOCARDIOGRAM COMPLETE
AR max vel: 1.17 cm2
AV Area VTI: 1.27 cm2
AV Area mean vel: 1.22 cm2
AV Mean grad: 35 mmHg
AV Peak grad: 62.4 mmHg
Ao pk vel: 3.95 m/s
Area-P 1/2: 3.63 cm2
MV VTI: 1.98 cm2
P 1/2 time: 99 ms
S' Lateral: 3.5 cm

## 2024-01-05 ENCOUNTER — Ambulatory Visit: Payer: Self-pay | Admitting: Cardiology

## 2024-01-05 DIAGNOSIS — I38 Endocarditis, valve unspecified: Secondary | ICD-10-CM

## 2024-01-05 DIAGNOSIS — R9430 Abnormal result of cardiovascular function study, unspecified: Secondary | ICD-10-CM

## 2024-01-05 DIAGNOSIS — Z952 Presence of prosthetic heart valve: Secondary | ICD-10-CM

## 2024-01-17 ENCOUNTER — Encounter (HOSPITAL_COMMUNITY): Payer: Self-pay

## 2024-01-19 ENCOUNTER — Ambulatory Visit (HOSPITAL_COMMUNITY)
Admission: RE | Admit: 2024-01-19 | Discharge: 2024-01-19 | Disposition: A | Discharge status: Home / Self Care | Source: Ambulatory Visit | Attending: Internal Medicine | Admitting: Internal Medicine

## 2024-01-19 ENCOUNTER — Telehealth: Payer: Self-pay | Admitting: Internal Medicine

## 2024-01-19 DIAGNOSIS — Z952 Presence of prosthetic heart valve: Secondary | ICD-10-CM

## 2024-01-19 DIAGNOSIS — R9430 Abnormal result of cardiovascular function study, unspecified: Secondary | ICD-10-CM | POA: Insufficient documentation

## 2024-01-19 DIAGNOSIS — I38 Endocarditis, valve unspecified: Secondary | ICD-10-CM

## 2024-01-19 DIAGNOSIS — R911 Solitary pulmonary nodule: Secondary | ICD-10-CM

## 2024-01-19 MED ORDER — IOHEXOL 350 MG/ML SOLN
100.0000 mL | Freq: Once | INTRAVENOUS | Status: AC | PRN
  Administered 2024-01-19: 100 mL via INTRAVENOUS

## 2024-01-19 NOTE — Telephone Encounter (Signed)
 Calling with unexpected finding for the patient. Please advise

## 2024-01-19 NOTE — Telephone Encounter (Signed)
 Will forward to Reader D today.

## 2024-01-19 NOTE — Telephone Encounter (Signed)
 Ms. Bumpus is a 63 year old with pulmonary hypertension who presents for Cardiac CT;  echocardiogram showed increased gradients and a CT scan revealed a pulmonary nodule and small pannus.  She is asymptomatic.  She is accompanied today by her sister on the phone.  She has a history of pulmonary hypertension and underwent a recent echocardiogram which showed increased gradients on her aortic valve, with a measurement of almost 35 mmHg. This prompted a CT scan, which incidentally revealed a pulmonary nodule in the right lower lobe, measuring under one centimeter.The echocardiogram showed that the aortic valve gradients had increased compared to previous studies, although the DVI remained the same. The stroke volume was noted to be higher than in previous studies.  She has a history of mechanical aortic and mitral valves and has been on anticoagulation therapy with a higher INR goal due to atrial flutter and the mechanical valves.  Last INR was 3.0 and she has no bleeding  The CT scan did not show thrombus or restriction. A small pannus on the aortic valve was noted. The pulmonary nodule was identified as a sub-centimeter nodule in the right lower lobe, with no associated lymphadenopathy or other concerning features.  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 No significant thrombus detected. Repeat surgery not recommended. - Order echocardiogram in 6 months to reassess valve gradients and function. - small left to right atrial shunt suspected on CT: this should be a bubble study  Right lower lobe subcentimeter pulmonary nodule Subcentimeter nodule in right lower lobe identified on CT. No concerning features such as lymphadenopathy or other lesions.  -  Order noncontrast CT of the chest in 6 months to monitor the pulmonary nodule.  No change in symptoms.  After this testing, patient needs to follow up with me.  She was not thought to be a re-do surgical candidate by [facility] at last eval. Given HU of valve density and normal leaflet motion I did not start ASA 81 mg PO daily; her INR goal is 3.0.  Stanly Leavens, MD FASE [facility] Cardiologist [facility]  [address] [place], [place] [address] [phone]  3:55 PM

## 2024-01-19 NOTE — Telephone Encounter (Signed)
 Narrative & Impression  EXAM: OVER-READ INTERPRETATION  CARDIAC-CT CHEST   The following report is an over-read performed by radiologist Dr. Camellia Lang [facility] on 01/19/2024. This over-read does not include interpretation of cardiac or coronary anatomy or pathology. The cardiac CT interpretation by the cardiologist is to be attached.   COMPARISON:  High-resolution chest CT 05/18/2021.   FINDINGS: No evidence for lymphadenopathy within the visualized mediastinum or hilar regions.   8 mm right lower lobe pulmonary nodule on 72/309 is new in the interval. Dependent atelectasis or scarring noted in both lung bases, left greater than right. Surgical scarring noted posterior left upper lobe.   Visualized portions of the upper abdomen are unremarkable.   No suspicious lytic or sclerotic osseous abnormality.   IMPRESSION: 8 mm right lower lobe pulmonary nodule is new in the interval. Non-contrast chest CT at 6-12 months is recommended. If the nodule is stable at time of repeat CT, then future CT at 18-24 months (from today's scan) is considered optional for low-risk patients, but is recommended for high-risk patients. This recommendation follows the consensus statement: Guidelines for Management of Incidental Pulmonary Nodules Detected on CT Images: From the Fleischner Society 2017; Radiology 2017; 284:228-243.   These results will be called to the ordering clinician or representative by the Radiologist Assistant, and communication documented in the PACS or [employer].     Electronically Signed   By: Camellia Candle M.D.   On: 01/19/2024 09:42  [facility] called to report over-read. Will forward to Dr. Santo

## 2024-01-24 ENCOUNTER — Ambulatory Visit: Attending: Cardiology

## 2024-01-24 DIAGNOSIS — I480 Paroxysmal atrial fibrillation: Secondary | ICD-10-CM | POA: Insufficient documentation

## 2024-01-24 DIAGNOSIS — Z5181 Encounter for therapeutic drug level monitoring: Secondary | ICD-10-CM | POA: Diagnosis present

## 2024-01-24 LAB — POCT INR: INR: 3.4 — AB (ref 2.0–3.0)

## 2024-01-24 NOTE — Addendum Note (Signed)
 Addended by: RANDY HAMP SAILOR on: 01/24/2024 03:17 PM   Modules accepted: Orders

## 2024-01-24 NOTE — Telephone Encounter (Signed)
 Called pt to review MD notes.  Pt reports MD has already called her.  Pt has no questions or concerns.  Pt is aware will need an Echo Bubble study and CT Chest in Feb 2026.  Pt had no questions or concerns.  Orders placed.

## 2024-01-24 NOTE — Patient Instructions (Signed)
 Continue taking Warfarin 1/2 tablet every day.   Recheck INR in 6 weeks  Coumadin  Clinic # [phone]

## 2024-03-06 ENCOUNTER — Ambulatory Visit: Attending: Cardiology

## 2024-03-06 ENCOUNTER — Encounter: Payer: Self-pay | Admitting: Internal Medicine

## 2024-03-06 DIAGNOSIS — I480 Paroxysmal atrial fibrillation: Secondary | ICD-10-CM | POA: Diagnosis present

## 2024-03-06 DIAGNOSIS — Z5181 Encounter for therapeutic drug level monitoring: Secondary | ICD-10-CM | POA: Diagnosis present

## 2024-03-06 LAB — POCT INR: INR: 3.7 — AB (ref 2.0–3.0)

## 2024-03-06 NOTE — Patient Instructions (Signed)
 Hold tonight only then Continue taking Warfarin 1/2 tablet every day.   Recheck INR in 6 weeks  Coumadin  Clinic # [phone]

## 2024-03-08 ENCOUNTER — Other Ambulatory Visit: Payer: Self-pay | Admitting: Internal Medicine

## 2024-04-17 ENCOUNTER — Ambulatory Visit: Attending: Cardiology

## 2024-04-17 DIAGNOSIS — Z5181 Encounter for therapeutic drug level monitoring: Secondary | ICD-10-CM | POA: Insufficient documentation

## 2024-04-17 DIAGNOSIS — I480 Paroxysmal atrial fibrillation: Secondary | ICD-10-CM | POA: Diagnosis present

## 2024-04-17 LAB — POCT INR: INR: 3.1 — AB (ref 2.0–3.0)

## 2024-04-17 NOTE — Patient Instructions (Signed)
 Continue taking Warfarin 1/2 tablet every day.   Recheck INR in 6 weeks  Coumadin  Clinic # [phone]

## 2024-05-01 ENCOUNTER — Ambulatory Visit: Payer: Self-pay | Admitting: Internal Medicine

## 2024-05-01 ENCOUNTER — Ambulatory Visit (HOSPITAL_COMMUNITY)
Admission: RE | Admit: 2024-05-01 | Discharge: 2024-05-01 | Disposition: A | Source: Ambulatory Visit | Attending: Internal Medicine

## 2024-05-01 DIAGNOSIS — R911 Solitary pulmonary nodule: Secondary | ICD-10-CM | POA: Insufficient documentation

## 2024-05-01 DIAGNOSIS — Z952 Presence of prosthetic heart valve: Secondary | ICD-10-CM | POA: Diagnosis not present

## 2024-05-01 DIAGNOSIS — I38 Endocarditis, valve unspecified: Secondary | ICD-10-CM

## 2024-05-01 LAB — ECHOCARDIOGRAM LIMITED BUBBLE STUDY
AV Mean grad: 15.5 mmHg
AV Peak grad: 28.2 mmHg
Ao pk vel: 2.66 m/s
Area-P 1/2: 2.58 cm2
Est EF: >75
S' Lateral: 3.8 cm

## 2024-05-01 MED ORDER — PERFLUTREN LIPID MICROSPHERE
1.0000 mL | INTRAVENOUS | Status: AC | PRN
  Administered 2024-05-01: 1 mL via INTRAVENOUS

## 2024-05-26 ENCOUNTER — Other Ambulatory Visit: Payer: Self-pay | Admitting: Internal Medicine

## 2024-05-29 ENCOUNTER — Ambulatory Visit

## 2024-05-31 ENCOUNTER — Inpatient Hospital Stay (HOSPITAL_COMMUNITY)
Admission: EM | Admit: 2024-05-31 | Discharge: 2024-06-05 | DRG: 811 | Disposition: A | Discharge status: Home / Self Care | Attending: Internal Medicine | Admitting: Internal Medicine

## 2024-05-31 ENCOUNTER — Other Ambulatory Visit: Payer: Self-pay

## 2024-05-31 ENCOUNTER — Encounter (HOSPITAL_COMMUNITY): Payer: Self-pay | Admitting: *Deleted

## 2024-05-31 ENCOUNTER — Emergency Department (HOSPITAL_COMMUNITY)

## 2024-05-31 DIAGNOSIS — D631 Anemia in chronic kidney disease: Principal | ICD-10-CM | POA: Diagnosis present

## 2024-05-31 DIAGNOSIS — I130 Hypertensive heart and chronic kidney disease with heart failure and stage 1 through stage 4 chronic kidney disease, or unspecified chronic kidney disease: Secondary | ICD-10-CM | POA: Diagnosis present

## 2024-05-31 DIAGNOSIS — Z79899 Other long term (current) drug therapy: Secondary | ICD-10-CM

## 2024-05-31 DIAGNOSIS — J449 Chronic obstructive pulmonary disease, unspecified: Secondary | ICD-10-CM | POA: Diagnosis present

## 2024-05-31 DIAGNOSIS — K5730 Diverticulosis of large intestine without perforation or abscess without bleeding: Secondary | ICD-10-CM | POA: Diagnosis present

## 2024-05-31 DIAGNOSIS — Z6841 Body Mass Index (BMI) 40.0 and over, adult: Secondary | ICD-10-CM

## 2024-05-31 DIAGNOSIS — Z7901 Long term (current) use of anticoagulants: Secondary | ICD-10-CM

## 2024-05-31 DIAGNOSIS — Z8419 Family history of other disorders of kidney and ureter: Secondary | ICD-10-CM

## 2024-05-31 DIAGNOSIS — N1832 Chronic kidney disease, stage 3b: Secondary | ICD-10-CM | POA: Diagnosis present

## 2024-05-31 DIAGNOSIS — Z8249 Family history of ischemic heart disease and other diseases of the circulatory system: Secondary | ICD-10-CM

## 2024-05-31 DIAGNOSIS — Z825 Family history of asthma and other chronic lower respiratory diseases: Secondary | ICD-10-CM

## 2024-05-31 DIAGNOSIS — T45515A Adverse effect of anticoagulants, initial encounter: Secondary | ICD-10-CM | POA: Diagnosis present

## 2024-05-31 DIAGNOSIS — K254 Chronic or unspecified gastric ulcer with hemorrhage: Secondary | ICD-10-CM | POA: Diagnosis present

## 2024-05-31 DIAGNOSIS — D509 Iron deficiency anemia, unspecified: Secondary | ICD-10-CM | POA: Diagnosis present

## 2024-05-31 DIAGNOSIS — K648 Other hemorrhoids: Secondary | ICD-10-CM | POA: Diagnosis present

## 2024-05-31 DIAGNOSIS — D62 Acute posthemorrhagic anemia: Principal | ICD-10-CM | POA: Diagnosis present

## 2024-05-31 DIAGNOSIS — E785 Hyperlipidemia, unspecified: Secondary | ICD-10-CM | POA: Diagnosis present

## 2024-05-31 DIAGNOSIS — Z833 Family history of diabetes mellitus: Secondary | ICD-10-CM

## 2024-05-31 DIAGNOSIS — K900 Celiac disease: Secondary | ICD-10-CM | POA: Diagnosis present

## 2024-05-31 DIAGNOSIS — I5032 Chronic diastolic (congestive) heart failure: Secondary | ICD-10-CM | POA: Diagnosis present

## 2024-05-31 DIAGNOSIS — D6832 Hemorrhagic disorder due to extrinsic circulating anticoagulants: Secondary | ICD-10-CM | POA: Diagnosis present

## 2024-05-31 DIAGNOSIS — F32A Depression, unspecified: Secondary | ICD-10-CM | POA: Diagnosis present

## 2024-05-31 DIAGNOSIS — J9611 Chronic respiratory failure with hypoxia: Secondary | ICD-10-CM | POA: Diagnosis present

## 2024-05-31 DIAGNOSIS — M109 Gout, unspecified: Secondary | ICD-10-CM | POA: Diagnosis present

## 2024-05-31 DIAGNOSIS — Z9884 Bariatric surgery status: Secondary | ICD-10-CM

## 2024-05-31 DIAGNOSIS — D599 Acquired hemolytic anemia, unspecified: Secondary | ICD-10-CM | POA: Diagnosis present

## 2024-05-31 DIAGNOSIS — Z23 Encounter for immunization: Secondary | ICD-10-CM

## 2024-05-31 DIAGNOSIS — I480 Paroxysmal atrial fibrillation: Secondary | ICD-10-CM

## 2024-05-31 DIAGNOSIS — Z952 Presence of prosthetic heart valve: Secondary | ICD-10-CM

## 2024-05-31 DIAGNOSIS — K2971 Gastritis, unspecified, with bleeding: Secondary | ICD-10-CM | POA: Diagnosis present

## 2024-05-31 DIAGNOSIS — Z87891 Personal history of nicotine dependence: Secondary | ICD-10-CM

## 2024-05-31 DIAGNOSIS — Z9981 Dependence on supplemental oxygen: Secondary | ICD-10-CM

## 2024-05-31 DIAGNOSIS — D649 Anemia, unspecified: Principal | ICD-10-CM | POA: Diagnosis present

## 2024-05-31 DIAGNOSIS — I890 Lymphedema, not elsewhere classified: Secondary | ICD-10-CM | POA: Diagnosis present

## 2024-05-31 DIAGNOSIS — J849 Interstitial pulmonary disease, unspecified: Secondary | ICD-10-CM | POA: Diagnosis present

## 2024-05-31 DIAGNOSIS — Z823 Family history of stroke: Secondary | ICD-10-CM

## 2024-05-31 LAB — CBC
HCT: 25.1 % — ABNORMAL LOW (ref 36.0–46.0)
Hemoglobin: 6.8 g/dL — CL (ref 12.0–15.0)
MCH: 24.1 pg — ABNORMAL LOW (ref 26.0–34.0)
MCHC: 27.1 g/dL — ABNORMAL LOW (ref 30.0–36.0)
MCV: 89 fL (ref 80.0–100.0)
Platelets: 240 K/uL (ref 150–400)
RBC: 2.82 MIL/uL — ABNORMAL LOW (ref 3.87–5.11)
RDW: 16.9 % — ABNORMAL HIGH (ref 11.5–15.5)
WBC: 7.8 K/uL (ref 4.0–10.5)
nRBC: 0 % (ref 0.0–0.2)

## 2024-05-31 LAB — BASIC METABOLIC PANEL WITH GFR
Anion gap: 9 (ref 5–15)
BUN: 39 mg/dL — ABNORMAL HIGH (ref 8–23)
CO2: 30 mmol/L (ref 22–32)
Calcium: 9.5 mg/dL (ref 8.9–10.3)
Chloride: 101 mmol/L (ref 98–111)
Creatinine, Ser: 1.6 mg/dL — ABNORMAL HIGH (ref 0.44–1.00)
GFR, Estimated: 36 mL/min — ABNORMAL LOW
Glucose, Bld: 88 mg/dL (ref 70–99)
Potassium: 5.2 mmol/L — ABNORMAL HIGH (ref 3.5–5.1)
Sodium: 139 mmol/L (ref 135–145)

## 2024-05-31 NOTE — ED Provider Triage Note (Signed)
 Emergency Medicine Provider Triage Evaluation Note  [name] , a 64 y.o. female  was evaluated in triage.  Pt complains of shortness of breath over the last 2 weeks that has not improved.  Patient does have a history of COPD and normally wears 3 L of oxygen  at baseline.  She states she recently saw her cardiologist and had an echocardiogram that was normal.  However, for the last 2 weeks she states that she has significant dyspnea on exertion which is abnormal for her.  Review of Systems  Positive: Shortness of breath Negative:   Physical Exam  BP 109/88 (BP Location: Left Arm)   Pulse (!) 56   Temp 98.8 F (37.1 C) (Oral)   Resp 18   SpO2 100%  Gen:   Awake, no distress   Resp:  Normal effort, patient on 5 L of O2 MSK:   Moves extremities without difficulty  Other:    Medical Decision Making  Medically screening exam initiated at 8:25 PM.  Appropriate orders placed.  Jem Castro was informed that the remainder of the evaluation will be completed by another provider, this initial triage assessment does not replace that evaluation, and the importance of remaining in the ED until their evaluation is complete.  Labs, chest x-ray, EKG ordered.   Torrence Marry RAMAN, [place] 05/31/24 2026

## 2024-05-31 NOTE — ED Triage Notes (Signed)
 PT here via [facility] for sob with ambulation x 2 weeks.  Called 911 because she just wasn't getting better.  Wears 3 L  per norm per COPD.    Bp-  162/82 Hr 72 nrs Rr 18 82 RA, 88 3L, 95 4L 149 cbg  20 LFA

## 2024-06-01 DIAGNOSIS — K2971 Gastritis, unspecified, with bleeding: Secondary | ICD-10-CM | POA: Diagnosis present

## 2024-06-01 DIAGNOSIS — Z6841 Body Mass Index (BMI) 40.0 and over, adult: Secondary | ICD-10-CM | POA: Diagnosis not present

## 2024-06-01 DIAGNOSIS — Z7901 Long term (current) use of anticoagulants: Secondary | ICD-10-CM | POA: Diagnosis not present

## 2024-06-01 DIAGNOSIS — D500 Iron deficiency anemia secondary to blood loss (chronic): Secondary | ICD-10-CM

## 2024-06-01 DIAGNOSIS — M109 Gout, unspecified: Secondary | ICD-10-CM | POA: Diagnosis present

## 2024-06-01 DIAGNOSIS — J9611 Chronic respiratory failure with hypoxia: Secondary | ICD-10-CM | POA: Diagnosis present

## 2024-06-01 DIAGNOSIS — T45515A Adverse effect of anticoagulants, initial encounter: Secondary | ICD-10-CM | POA: Diagnosis present

## 2024-06-01 DIAGNOSIS — F32A Depression, unspecified: Secondary | ICD-10-CM | POA: Diagnosis present

## 2024-06-01 DIAGNOSIS — D6832 Hemorrhagic disorder due to extrinsic circulating anticoagulants: Secondary | ICD-10-CM | POA: Diagnosis present

## 2024-06-01 DIAGNOSIS — I5032 Chronic diastolic (congestive) heart failure: Secondary | ICD-10-CM | POA: Diagnosis present

## 2024-06-01 DIAGNOSIS — K254 Chronic or unspecified gastric ulcer with hemorrhage: Secondary | ICD-10-CM | POA: Diagnosis present

## 2024-06-01 DIAGNOSIS — D62 Acute posthemorrhagic anemia: Secondary | ICD-10-CM | POA: Diagnosis present

## 2024-06-01 DIAGNOSIS — D598 Other acquired hemolytic anemias: Secondary | ICD-10-CM | POA: Diagnosis not present

## 2024-06-01 DIAGNOSIS — I130 Hypertensive heart and chronic kidney disease with heart failure and stage 1 through stage 4 chronic kidney disease, or unspecified chronic kidney disease: Secondary | ICD-10-CM | POA: Diagnosis present

## 2024-06-01 DIAGNOSIS — D649 Anemia, unspecified: Secondary | ICD-10-CM | POA: Diagnosis present

## 2024-06-01 DIAGNOSIS — E785 Hyperlipidemia, unspecified: Secondary | ICD-10-CM | POA: Diagnosis present

## 2024-06-01 DIAGNOSIS — Z9981 Dependence on supplemental oxygen: Secondary | ICD-10-CM | POA: Diagnosis not present

## 2024-06-01 DIAGNOSIS — Z952 Presence of prosthetic heart valve: Secondary | ICD-10-CM | POA: Diagnosis not present

## 2024-06-01 DIAGNOSIS — J449 Chronic obstructive pulmonary disease, unspecified: Secondary | ICD-10-CM | POA: Diagnosis present

## 2024-06-01 DIAGNOSIS — J849 Interstitial pulmonary disease, unspecified: Secondary | ICD-10-CM | POA: Diagnosis present

## 2024-06-01 DIAGNOSIS — D631 Anemia in chronic kidney disease: Secondary | ICD-10-CM | POA: Diagnosis present

## 2024-06-01 DIAGNOSIS — Z23 Encounter for immunization: Secondary | ICD-10-CM | POA: Diagnosis present

## 2024-06-01 DIAGNOSIS — N1832 Chronic kidney disease, stage 3b: Secondary | ICD-10-CM | POA: Diagnosis present

## 2024-06-01 DIAGNOSIS — D599 Acquired hemolytic anemia, unspecified: Secondary | ICD-10-CM | POA: Diagnosis present

## 2024-06-01 DIAGNOSIS — D509 Iron deficiency anemia, unspecified: Secondary | ICD-10-CM | POA: Diagnosis present

## 2024-06-01 DIAGNOSIS — Z8249 Family history of ischemic heart disease and other diseases of the circulatory system: Secondary | ICD-10-CM | POA: Diagnosis not present

## 2024-06-01 LAB — POC OCCULT BLOOD, ED: Fecal Occult Blood, POC: NEGATIVE

## 2024-06-01 LAB — FOLATE: Folate: >20 ng/mL

## 2024-06-01 LAB — CBC
HCT: 29.3 % — ABNORMAL LOW (ref 36.0–46.0)
Hemoglobin: 8.6 g/dL — ABNORMAL LOW (ref 12.0–15.0)
MCH: 25.4 pg — ABNORMAL LOW (ref 26.0–34.0)
MCHC: 29.4 g/dL — ABNORMAL LOW (ref 30.0–36.0)
MCV: 86.7 fL (ref 80.0–100.0)
Platelets: 246 K/uL (ref 150–400)
RBC: 3.38 MIL/uL — ABNORMAL LOW (ref 3.87–5.11)
RDW: 17.3 % — ABNORMAL HIGH (ref 11.5–15.5)
WBC: 8.9 K/uL (ref 4.0–10.5)
nRBC: 0 % (ref 0.0–0.2)

## 2024-06-01 LAB — RETICULOCYTES
Immature Retic Fract: 17.7 % — ABNORMAL HIGH (ref 2.3–15.9)
RBC.: 3.45 MIL/uL — ABNORMAL LOW (ref 3.87–5.11)
Retic Count, Absolute: 71.5 K/uL (ref 19.0–186.0)
Retic Ct Pct: 2.1 % (ref 0.4–3.1)

## 2024-06-01 LAB — IRON AND TIBC
Iron: 61 ug/dL (ref 28–170)
Saturation Ratios: 13 % (ref 10.4–31.8)
TIBC: 456 ug/dL — ABNORMAL HIGH (ref 250–450)
UIBC: 395 ug/dL

## 2024-06-01 LAB — LACTATE DEHYDROGENASE: LDH: 436 U/L — ABNORMAL HIGH (ref 105–235)

## 2024-06-01 LAB — PROTIME-INR
INR: 1.9 — ABNORMAL HIGH (ref 0.8–1.2)
Prothrombin Time: 22.5 s — ABNORMAL HIGH (ref 11.4–15.2)

## 2024-06-01 LAB — PREPARE RBC (CROSSMATCH)

## 2024-06-01 LAB — FERRITIN: Ferritin: 12 ng/mL (ref 11–307)

## 2024-06-01 LAB — HEPARIN LEVEL (UNFRACTIONATED): Heparin Unfractionated: 0.15 [IU]/mL — ABNORMAL LOW (ref 0.30–0.70)

## 2024-06-01 LAB — VITAMIN B12: Vitamin B-12: 375 pg/mL (ref 180–914)

## 2024-06-01 MED ORDER — ACETAMINOPHEN 650 MG RE SUPP: 650.0000 mg | Freq: Four times a day (QID) | RECTAL | Status: DC | PRN

## 2024-06-01 MED ORDER — INFLUENZA VIRUS VACC SPLIT PF (FLUZONE) 0.5 ML IM SUSY
0.5000 mL | PREFILLED_SYRINGE | INTRAMUSCULAR | Status: AC
  Administered 2024-06-05: 0.5 mL via INTRAMUSCULAR
  Filled 2024-06-01: qty 0.5

## 2024-06-01 MED ORDER — ALLOPURINOL 300 MG PO TABS
300.0000 mg | ORAL_TABLET | Freq: Every day | ORAL | Status: DC
  Administered 2024-06-01 – 2024-06-04 (×4): 300 mg via ORAL
  Filled 2024-06-01 (×4): qty 1

## 2024-06-01 MED ORDER — WARFARIN SODIUM 2.5 MG PO TABS
3.7500 mg | ORAL_TABLET | Freq: Every day | ORAL | Status: DC
  Administered 2024-06-01: 3.75 mg via ORAL
  Filled 2024-06-01 (×2): qty 1

## 2024-06-01 MED ORDER — METOPROLOL TARTRATE 25 MG PO TABS: 25.0000 mg | ORAL_TABLET | Freq: Two times a day (BID) | ORAL | Status: DC

## 2024-06-01 MED ORDER — FUROSEMIDE 20 MG PO TABS
80.0000 mg | ORAL_TABLET | Freq: Two times a day (BID) | ORAL | Status: DC
  Administered 2024-06-01: 80 mg via ORAL
  Filled 2024-06-01: qty 4

## 2024-06-01 MED ORDER — WARFARIN - PHARMACIST DOSING INPATIENT: Freq: Every day | Status: DC

## 2024-06-01 MED ORDER — HEPARIN (PORCINE) 25000 UT/250ML-% IV SOLN
1550.0000 [IU]/h | INTRAVENOUS | Status: AC
  Administered 2024-06-01: 1400 [IU]/h via INTRAVENOUS
  Administered 2024-06-02: 1600 [IU]/h via INTRAVENOUS
  Administered 2024-06-02: 1550 [IU]/h via INTRAVENOUS
  Administered 2024-06-03 – 2024-06-05 (×3): 1500 [IU]/h via INTRAVENOUS
  Filled 2024-06-01 (×6): qty 250

## 2024-06-01 MED ORDER — SODIUM CHLORIDE 0.9% IV SOLUTION: Freq: Once | INTRAVENOUS | Status: AC

## 2024-06-01 MED ORDER — ACETAMINOPHEN 325 MG PO TABS
650.0000 mg | ORAL_TABLET | Freq: Four times a day (QID) | ORAL | Status: DC | PRN
  Administered 2024-06-01 – 2024-06-04 (×4): 650 mg via ORAL
  Filled 2024-06-01 (×4): qty 2

## 2024-06-01 MED ORDER — PRAVASTATIN SODIUM 40 MG PO TABS
80.0000 mg | ORAL_TABLET | Freq: Every day | ORAL | Status: DC
  Administered 2024-06-02 – 2024-06-05 (×4): 80 mg via ORAL
  Filled 2024-06-01 (×4): qty 2

## 2024-06-01 MED ORDER — ONDANSETRON HCL 4 MG PO TABS: 4.0000 mg | ORAL_TABLET | Freq: Four times a day (QID) | ORAL | Status: DC | PRN

## 2024-06-01 MED ORDER — FLUOXETINE HCL 20 MG PO CAPS
20.0000 mg | ORAL_CAPSULE | Freq: Every day | ORAL | Status: DC
  Administered 2024-06-02 – 2024-06-05 (×4): 20 mg via ORAL
  Filled 2024-06-01 (×4): qty 1

## 2024-06-01 MED ORDER — ONDANSETRON HCL 4 MG/2ML IJ SOLN: 4.0000 mg | Freq: Four times a day (QID) | INTRAMUSCULAR | Status: DC | PRN

## 2024-06-01 MED ORDER — PNEUMOCOCCAL 20-VAL CONJ VACC 0.5 ML IM SUSY
0.5000 mL | PREFILLED_SYRINGE | INTRAMUSCULAR | Status: DC
  Filled 2024-06-01: qty 0.5

## 2024-06-01 NOTE — Plan of Care (Signed)
 PLAN OF CARE NOTE  Morning admission  [name] is a 64 y.o. female with medical history significant of hypertension, atrial flutter, CHF, CKD, mechanical valve on warfarin who presented with progressive shortness of breath and found to be anemic on presentation with hemoglobin of 6.  Blood transfusion ordered.  GI consulted.  Patient remains at her baseline 3 L/min supplemental oxygen  (history of COPD).  Patient states she is feeling better.   On examination, patient is alert, oriented. Chest: Clear to auscultation. Lymphedema right lower extremity.  - Follow-up anemia workup - Continue heparin  GTT due to subtherapeutic INR (presented with INR 1.9 with goal INR 2.5-3.5). - Follow-up GI consult.  MDALA-GAUSI, [place], MD 06/01/2024 1:29 PM
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
## 2024-06-01 NOTE — ED Notes (Signed)
 Mdala-Gausi, MD aware of low trending blood pressure. No new orders at this time
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
## 2024-06-01 NOTE — Progress Notes (Signed)
 Pt arrived to 6N30 via stretcher from the ED. Pt placed on 3 L O2 and she is able to walk from the stretcher to the bed.

## 2024-06-01 NOTE — ED Notes (Signed)
 Informed consent signed by this RN. Placed at bedside.
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
## 2024-06-01 NOTE — Plan of Care (Signed)

## 2024-06-02 ENCOUNTER — Encounter (HOSPITAL_COMMUNITY): Admission: EM | Disposition: A | Payer: Self-pay | Source: Home / Self Care | Attending: Internal Medicine

## 2024-06-02 HISTORY — PX: GIVENS CAPSULE STUDY: SHX5432

## 2024-06-02 LAB — CBC
HCT: 28.1 % — ABNORMAL LOW (ref 36.0–46.0)
Hemoglobin: 8.1 g/dL — ABNORMAL LOW (ref 12.0–15.0)
MCH: 24.8 pg — ABNORMAL LOW (ref 26.0–34.0)
MCHC: 28.8 g/dL — ABNORMAL LOW (ref 30.0–36.0)
MCV: 86.2 fL (ref 80.0–100.0)
Platelets: 215 K/uL (ref 150–400)
RBC: 3.26 MIL/uL — ABNORMAL LOW (ref 3.87–5.11)
RDW: 17.1 % — ABNORMAL HIGH (ref 11.5–15.5)
WBC: 7.1 K/uL (ref 4.0–10.5)
nRBC: 0 % (ref 0.0–0.2)

## 2024-06-02 LAB — HEPATIC FUNCTION PANEL
ALT: 9 U/L (ref 0–44)
AST: 30 U/L (ref 15–41)
Albumin: 4.1 g/dL (ref 3.5–5.0)
Alkaline Phosphatase: 70 U/L (ref 38–126)
Bilirubin, Direct: 0.3 mg/dL — ABNORMAL HIGH (ref 0.0–0.2)
Indirect Bilirubin: 0.6 mg/dL (ref 0.3–0.9)
Total Bilirubin: 0.9 mg/dL (ref 0.0–1.2)
Total Protein: 7.3 g/dL (ref 6.5–8.1)

## 2024-06-02 LAB — BASIC METABOLIC PANEL WITH GFR
Anion gap: 11 (ref 5–15)
BUN: 34 mg/dL — ABNORMAL HIGH (ref 8–23)
CO2: 25 mmol/L (ref 22–32)
Calcium: 9.6 mg/dL (ref 8.9–10.3)
Chloride: 102 mmol/L (ref 98–111)
Creatinine, Ser: 1.36 mg/dL — ABNORMAL HIGH (ref 0.44–1.00)
GFR, Estimated: 43 mL/min — ABNORMAL LOW
Glucose, Bld: 82 mg/dL (ref 70–99)
Potassium: 4.3 mmol/L (ref 3.5–5.1)
Sodium: 139 mmol/L (ref 135–145)

## 2024-06-02 LAB — BPAM RBC
Blood Product Expiration Date: 202601272359
Blood Product Expiration Date: 202601272359
ISSUE DATE / TIME: 202601020544
ISSUE DATE / TIME: 202601020847
Unit Type and Rh: 5100
Unit Type and Rh: 5100

## 2024-06-02 LAB — TYPE AND SCREEN
ABO/RH(D): O POS
Antibody Screen: NEGATIVE
Unit division: 0
Unit division: 0

## 2024-06-02 LAB — HEPARIN LEVEL (UNFRACTIONATED)
Heparin Unfractionated: 0.5 [IU]/mL (ref 0.30–0.70)
Heparin Unfractionated: 0.63 [IU]/mL (ref 0.30–0.70)

## 2024-06-02 LAB — PROTIME-INR
INR: 1.8 — ABNORMAL HIGH (ref 0.8–1.2)
Prothrombin Time: 22.1 s — ABNORMAL HIGH (ref 11.4–15.2)

## 2024-06-02 LAB — HAPTOGLOBIN: Haptoglobin: <10 mg/dL — ABNORMAL LOW (ref 37–355)

## 2024-06-02 MED ORDER — IRON SUCROSE 200 MG IVPB - SIMPLE MED
200.0000 mg | Freq: Once | Status: AC
  Administered 2024-06-02: 200 mg via INTRAVENOUS
  Filled 2024-06-02: qty 200

## 2024-06-02 MED ORDER — WARFARIN SODIUM 5 MG PO TABS
5.0000 mg | ORAL_TABLET | Freq: Once | ORAL | Status: AC
  Administered 2024-06-02: 5 mg via ORAL
  Filled 2024-06-02: qty 1

## 2024-06-02 NOTE — Plan of Care (Signed)
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
## 2024-06-02 NOTE — Plan of Care (Signed)
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
## 2024-06-02 NOTE — Progress Notes (Signed)
 " Progress Note   Patient: [name] FMW:[id number] DOB: [birth year] DOA: 05/31/2024     1 DOS: the patient was seen and examined on 06/02/2024    Brief hospital course: Aprile Dickenson is a 64 y.o. female with medical history significant of hypertension, atrial flutter, CHF, CKD, mechanical valve on warfarin who presented with progressive shortness of breath and found to be anemic on presentation with hemoglobin of 6.  Blood transfusion ordered.  GI consulted and noted patient had an EGD/colonoscopy in 2023 for iron  deficiency anemia.  These revealed gastritis and diverticulosis as well as internal hemorrhoids.  Video capsule endoscopy started on 1/3.   Patient remains at her baseline 3 L/min supplemental oxygen  (history of COPD).  Assessment and Plan:  Acute anemia in patient with known history of hemolytic anemia, iron  deficiency anemia Likely acute blood loss anemia due to possible GI bleed in the setting of chronic anticoagulation for mechanical valves versus hemolytic anemia related to mechanical valves. Low haptoglobin, elevated LDH.  Noted to be chronic in the past. Baseline hemoglobin is approximately 13.5 Patient presented with hemoglobin of 6.8 She was transfused with 2 units PRBCs. Ferritin: 12, TIBC: 456. - Will continue to monitor hemoglobin. - Evaluation for GI bleed as indicated below. - IV Venofer  x 1. - Trend hemolysis labs - May need hematology consult.  Possible GI bleed Patient with iron  deficiency anemia. S/p EGD, colonoscopy in 2023 which revealed gastritis, diverticulosis. Gastroenterology consulted.  Input appreciated. - Video capsule endoscopy ordered.  Mechanical AVR, MVR Patient on warfarin with goal INR 2.5-3.5. Presented with subtherapeutic INR of 1.9 and started on heparin  bridge. Pharmacy consulted. - Continue heparin  bridge, Coumadin  per pharmacy.  CHF, not in exacerbation - Resume home metoprolol . - Resume home Lasix  in  AM.  COPD Chronic hypoxic respiratory failure on 3 L/min supplemental oxygen  at home. -Continue home medications. - Continue home oxygen .  CKD stage III Baseline creatinine 1.1-1.5. Creatinine is at baseline. - Avoid nephrotoxins. - Daily BMP.   Chronic conditions: Gout: Continue allopurinol . Depression: Continue Prozac . Hyperlipidemia: Continue statin       Subjective: Patient states she is feeling all right.  Physical Exam: BP (!) 118/43 (BP Location: Left Arm)   Pulse 66   Temp 98.2 F (36.8 C)   Resp 18   Ht 5' 6 (1.676 m)   Wt 124.3 kg   SpO2 95%   BMI 44.22 kg/m    General: Alert, oriented X3  Eyes: Pupils equal, reactive  Oral cavity: moist mucous membranes  Head: Atraumatic, normocephalic  Neck: supple  Chest: clear to auscultation. No crackles, no wheezes  CVS: S1,S2 RRR. No murmurs  Abd: No distention, soft, non-tender. No masses palpable  Extr: No edema   MSK: No joint deformities or swelling  Neurological: Grossly intact.    Data Reviewed:    Latest Ref Rng & Units 06/02/2024    8:09 AM 06/01/2024    1:44 PM 05/31/2024    8:51 PM  CBC  WBC 4.0 - 10.5 K/uL 7.1  8.9  7.8   Hemoglobin 12.0 - 15.0 g/dL 8.1  8.6  6.8   Hematocrit 36.0 - 46.0 % 28.1  29.3  25.1   Platelets 150 - 400 K/uL 215  246  240       Latest Ref Rng & Units 06/02/2024    8:09 AM 05/31/2024    8:51 PM 06/21/2023   12:20 PM  BMP  Glucose 70 - 99 mg/dL 82  88  83   BUN 8 - 23 mg/dL 34  39  24   Creatinine 0.44 - 1.00 mg/dL 8.63  8.39  8.57   BUN/Creat Ratio 12 - 28   17   Sodium 135 - 145 mmol/L 139  139  142   Potassium 3.5 - 5.1 mmol/L 4.3  5.2  4.6   Chloride 98 - 111 mmol/L 102  101  103   CO2 22 - 32 mmol/L 25  30  26    Calcium  8.9 - 10.3 mg/dL 9.6  9.5  9.9      Family Communication: n/a  Disposition: Status is: Inpatient Remains inpatient appropriate because: anemia requiring evaluation, transfusion; subtherapeutic INR requiring heparin  bridge  DVT PPx: On  systemic anticoagulation.      Author: MDALA-GAUSI, Caide Campi AGATHA, MD 06/02/2024 1:46 PM  For on call review [email or web address]    "

## 2024-06-02 NOTE — Progress Notes (Signed)
 [facility] capsule endoscopy ordered by MD Dianna.  Patient ingested capsule at 0712.  Per Given's capsule instructions, patient to remain NPO until 0912 at which time they may progress to clear liquid diet. At 1112 patient may have a small snack such as a half a sandwich or a bowl of soup. At 1512 patient may progress to previously ordered diet.  The capsule endoscopy study will conclude at 1912 at which time the recorder and leads or belt can be removed and placed in a patient belongings bag. Endoscopy staff will pick up the equipment in the AM.  Instructions provided to patient and inpatient RN. Patient and RN demonstrated understanding.   Diana VEAR Pouch, RN 06/02/2024 7:15 AM

## 2024-06-03 ENCOUNTER — Encounter (HOSPITAL_COMMUNITY): Payer: Self-pay | Admitting: Gastroenterology

## 2024-06-03 DIAGNOSIS — D509 Iron deficiency anemia, unspecified: Secondary | ICD-10-CM | POA: Diagnosis not present

## 2024-06-03 DIAGNOSIS — D598 Other acquired hemolytic anemias: Secondary | ICD-10-CM | POA: Diagnosis not present

## 2024-06-03 LAB — BASIC METABOLIC PANEL WITH GFR
Anion gap: 10 (ref 5–15)
BUN: 24 mg/dL — ABNORMAL HIGH (ref 8–23)
CO2: 28 mmol/L (ref 22–32)
Calcium: 9.5 mg/dL (ref 8.9–10.3)
Chloride: 103 mmol/L (ref 98–111)
Creatinine, Ser: 1.15 mg/dL — ABNORMAL HIGH (ref 0.44–1.00)
GFR, Estimated: 53 mL/min — ABNORMAL LOW
Glucose, Bld: 80 mg/dL (ref 70–99)
Potassium: 4.1 mmol/L (ref 3.5–5.1)
Sodium: 140 mmol/L (ref 135–145)

## 2024-06-03 LAB — HEPATIC FUNCTION PANEL
ALT: 10 U/L (ref 0–44)
AST: 23 U/L (ref 15–41)
Albumin: 4 g/dL (ref 3.5–5.0)
Alkaline Phosphatase: 69 U/L (ref 38–126)
Bilirubin, Direct: 0.3 mg/dL — ABNORMAL HIGH (ref 0.0–0.2)
Indirect Bilirubin: 0.4 mg/dL (ref 0.3–0.9)
Total Bilirubin: 0.8 mg/dL (ref 0.0–1.2)
Total Protein: 7.1 g/dL (ref 6.5–8.1)

## 2024-06-03 LAB — PROTIME-INR
INR: 1.7 — ABNORMAL HIGH (ref 0.8–1.2)
Prothrombin Time: 21.1 s — ABNORMAL HIGH (ref 11.4–15.2)

## 2024-06-03 LAB — CBC
HCT: 27.2 % — ABNORMAL LOW (ref 36.0–46.0)
Hemoglobin: 7.9 g/dL — ABNORMAL LOW (ref 12.0–15.0)
MCH: 25 pg — ABNORMAL LOW (ref 26.0–34.0)
MCHC: 29 g/dL — ABNORMAL LOW (ref 30.0–36.0)
MCV: 86.1 fL (ref 80.0–100.0)
Platelets: 236 K/uL (ref 150–400)
RBC: 3.16 MIL/uL — ABNORMAL LOW (ref 3.87–5.11)
RDW: 16.9 % — ABNORMAL HIGH (ref 11.5–15.5)
WBC: 7.1 K/uL (ref 4.0–10.5)
nRBC: 0.3 % — ABNORMAL HIGH (ref 0.0–0.2)

## 2024-06-03 LAB — LACTATE DEHYDROGENASE: LDH: 407 U/L — ABNORMAL HIGH (ref 105–235)

## 2024-06-03 LAB — HEPARIN LEVEL (UNFRACTIONATED): Heparin Unfractionated: 0.53 [IU]/mL (ref 0.30–0.70)

## 2024-06-03 MED ORDER — PANTOPRAZOLE SODIUM 40 MG PO TBEC
40.0000 mg | DELAYED_RELEASE_TABLET | Freq: Every day | ORAL | Status: DC
  Administered 2024-06-03 – 2024-06-05 (×3): 40 mg via ORAL
  Filled 2024-06-03 (×3): qty 1

## 2024-06-03 MED ORDER — WARFARIN SODIUM 6 MG PO TABS
6.0000 mg | ORAL_TABLET | Freq: Once | ORAL | Status: AC
  Administered 2024-06-03: 6 mg via ORAL
  Filled 2024-06-03: qty 1

## 2024-06-03 NOTE — Progress Notes (Addendum)
 [facility] Progress Note  [name] 64 y.o. [birth year]   Subjective: Denies abdominal pain  Objective: Vital signs: Vitals:   06/03/24 0516 06/03/24 0913  BP: (!) 117/58 (!) 113/46  Pulse: 76 76  Resp: 16 18  Temp: 98.2 F (36.8 C) 98.2 F (36.8 C)  SpO2: 94% 94%    Physical Exam: Gen: alert, no acute distress, obese, pleasant HEENT: anicteric sclera CV: RRR Chest: CTA B Abd: soft, nontender, nondistended, +BS Ext: no edema  Lab Results: Recent Labs    06/02/24 0809 06/03/24 0651  NA 139 140  K 4.3 4.1  CL 102 103  CO2 25 28  GLUCOSE 82 80  BUN 34* 24*  CREATININE 1.36* 1.15*  CALCIUM  9.6 9.5   Recent Labs    06/02/24 0809 06/03/24 0651  AST 30 23  ALT 9 10  ALKPHOS 70 69  BILITOT 0.9 0.8  PROT 7.3 7.1  ALBUMIN  4.1 4.0   Recent Labs    06/02/24 0809 06/03/24 0651  WBC 7.1 7.1  HGB 8.1* 7.9*  HCT 28.1* 27.2*  MCV 86.2 86.1  PLT 215 236      Assessment/Plan: Obscure occult GI bleed - Capsule endoscopy showed a small clean-based gastric ulcer and gastritis. Small gastric erosion. Focal area of scalloped mucosa of proximal small bowel - nonspecific but can be seen with celiac disease which can also cause anemia. Will check TTG IgA and quant IgA. Protonix  40 mg every day. Pt on bridging heparin  and coumadin . Hematology starting iron  supplementation. F/U with her hematologist. Supportive care. Will sign off. Call if questions.   [name] 06/03/2024, 2:39 PM  Questions please call [phone] ID: [name], female   DOB: 07-04-1960, 64 y.o.   MRN: [id number]

## 2024-06-03 NOTE — Progress Notes (Signed)
 " Progress Note   Patient: [name] FMW:[id number] DOB: 05/03/1961 DOA: 05/31/2024     2 DOS: the patient was seen and examined on 06/03/2024    Brief hospital course: Baylen Dea is a 64 y.o. female with medical history significant of hypertension, atrial flutter, CHF, CKD, mechanical valve on warfarin who presented with progressive shortness of breath and found to be anemic on presentation with hemoglobin of 6.  Blood transfusion ordered.  GI consulted and noted patient had an EGD/colonoscopy in 2023 for iron  deficiency anemia.  These revealed gastritis and diverticulosis as well as internal hemorrhoids.  Video capsule endoscopy started on 1/3.   Patient remains at her baseline 3 L/min supplemental oxygen  (history of COPD).  Assessment and Plan:  Acute anemia in patient with known history of hemolytic anemia, iron  deficiency anemia Likely acute blood loss anemia due to possible GI bleed in the setting of chronic anticoagulation for mechanical valves versus hemolytic anemia related to mechanical valves. Low haptoglobin, elevated LDH.  Noted to be chronic in the past. Baseline hemoglobin is approximately 13.5 Patient presented with hemoglobin of 6.8 She was transfused with 2 units PRBCs. Ferritin: 12, TIBC: 456. Received IV Venofer  x 1. - Will continue to monitor hemoglobin. - Evaluation for GI bleed as indicated below. - Trend hemolysis labs -Hematology consulted.  Possible GI bleed Patient with iron  deficiency anemia. S/p EGD, colonoscopy in 2023 which revealed gastritis, diverticulosis. Gastroenterology consulted.  Input appreciated. Patient is s/p video capsule endoscopy.  This showed a small clean-based gastric ulcer and gastritis. Small gastric erosion. Focal area of scalloped mucosa of proximal small bowel - nonspecific but can be seen with celiac disease which can also cause anemia.  - TTG IgA and quant IgA ordered by GI.. - Protonix  40 mg every day. -GI is  now signed off.  Mechanical AVR, MVR Patient on warfarin with goal INR 2.5-3.5. Presented with subtherapeutic INR of 1.9 and started on heparin  bridge. Pharmacy consulted. - Continue heparin  bridge, Coumadin  per pharmacy.  CHF, not in exacerbation - Resume home metoprolol . - Resume home Lasix  in AM.  COPD Chronic hypoxic respiratory failure on 3 L/min supplemental oxygen  at home. -Continue home medications. - Continue home oxygen .  CKD stage III Baseline creatinine 1.1-1.5. Creatinine is at baseline. - Avoid nephrotoxins. - Daily BMP.   Chronic conditions: Gout: Continue allopurinol . Depression: Continue Prozac . Hyperlipidemia: Continue statin       Subjective: Patient has no new complaints.  Physical Exam: BP (!) 113/46 (BP Location: Left Arm)   Pulse 76   Temp 98.2 F (36.8 C)   Resp 18   Ht 5' 6 (1.676 m)   Wt 124.3 kg   SpO2 94%   BMI 44.22 kg/m    General: Alert, oriented X3  Eyes: Pupils equal, reactive  Oral cavity: moist mucous membranes  Head: Atraumatic, normocephalic  Neck: supple  Chest: clear to auscultation. No crackles, no wheezes  CVS: S1,S2 RRR. No murmurs  Abd: No distention, soft, non-tender. No masses palpable  Extr: No edema   MSK: No joint deformities or swelling  Neurological: Grossly intact.    Data Reviewed:    Latest Ref Rng & Units 06/03/2024    6:51 AM 06/02/2024    8:09 AM 06/01/2024    1:44 PM  CBC  WBC 4.0 - 10.5 K/uL 7.1  7.1  8.9   Hemoglobin 12.0 - 15.0 g/dL 7.9  8.1  8.6   Hematocrit 36.0 - 46.0 % 27.2  28.1  29.3   Platelets 150 - 400 K/uL 236  215  246       Latest Ref Rng & Units 06/03/2024    6:51 AM 06/02/2024    8:09 AM 05/31/2024    8:51 PM  BMP  Glucose 70 - 99 mg/dL 80  82  88   BUN 8 - 23 mg/dL 24  34  39   Creatinine 0.44 - 1.00 mg/dL 8.84  8.63  8.39   Sodium 135 - 145 mmol/L 140  139  139   Potassium 3.5 - 5.1 mmol/L 4.1  4.3  5.2   Chloride 98 - 111 mmol/L 103  102  101   CO2 22 - 32 mmol/L 28   25  30    Calcium  8.9 - 10.3 mg/dL 9.5  9.6  9.5      Family Communication: n/a  Disposition: Status is: Inpatient Remains inpatient appropriate because: anemia requiring evaluation, transfusion; subtherapeutic INR requiring heparin  bridge  DVT PPx: On systemic anticoagulation.      Author: MDALA-GAUSI, Cortland Crehan AGATHA, MD 06/03/2024 4:36 PM  For on call review [email or web address]    "

## 2024-06-03 NOTE — Consult Note (Signed)
 Addendum to my note: IV iron  was given yesterday, will no reorder
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
## 2024-06-03 NOTE — Plan of Care (Signed)
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
## 2024-06-04 DIAGNOSIS — D509 Iron deficiency anemia, unspecified: Secondary | ICD-10-CM | POA: Diagnosis not present

## 2024-06-04 LAB — HEPATIC FUNCTION PANEL
ALT: 12 U/L (ref 0–44)
AST: 25 U/L (ref 15–41)
Albumin: 4.1 g/dL (ref 3.5–5.0)
Alkaline Phosphatase: 71 U/L (ref 38–126)
Bilirubin, Direct: 0.4 mg/dL — ABNORMAL HIGH (ref 0.0–0.2)
Indirect Bilirubin: 0.3 mg/dL (ref 0.3–0.9)
Total Bilirubin: 0.7 mg/dL (ref 0.0–1.2)
Total Protein: 7.2 g/dL (ref 6.5–8.1)

## 2024-06-04 LAB — CBC
HCT: 28.6 % — ABNORMAL LOW (ref 36.0–46.0)
Hemoglobin: 8.2 g/dL — ABNORMAL LOW (ref 12.0–15.0)
MCH: 24.8 pg — ABNORMAL LOW (ref 26.0–34.0)
MCHC: 28.7 g/dL — ABNORMAL LOW (ref 30.0–36.0)
MCV: 86.7 fL (ref 80.0–100.0)
Platelets: 262 K/uL (ref 150–400)
RBC: 3.3 MIL/uL — ABNORMAL LOW (ref 3.87–5.11)
RDW: 17 % — ABNORMAL HIGH (ref 11.5–15.5)
WBC: 9.2 K/uL (ref 4.0–10.5)
nRBC: 0.3 % — ABNORMAL HIGH (ref 0.0–0.2)

## 2024-06-04 LAB — BASIC METABOLIC PANEL WITH GFR
Anion gap: 9 (ref 5–15)
BUN: 19 mg/dL (ref 8–23)
CO2: 28 mmol/L (ref 22–32)
Calcium: 9.7 mg/dL (ref 8.9–10.3)
Chloride: 104 mmol/L (ref 98–111)
Creatinine, Ser: 1.18 mg/dL — ABNORMAL HIGH (ref 0.44–1.00)
GFR, Estimated: 51 mL/min — ABNORMAL LOW
Glucose, Bld: 91 mg/dL (ref 70–99)
Potassium: 4.1 mmol/L (ref 3.5–5.1)
Sodium: 140 mmol/L (ref 135–145)

## 2024-06-04 LAB — PROTIME-INR
INR: 1.9 — ABNORMAL HIGH (ref 0.8–1.2)
Prothrombin Time: 23 s — ABNORMAL HIGH (ref 11.4–15.2)

## 2024-06-04 LAB — LACTATE DEHYDROGENASE: LDH: 430 U/L — ABNORMAL HIGH (ref 105–235)

## 2024-06-04 LAB — HEPARIN LEVEL (UNFRACTIONATED): Heparin Unfractionated: 0.43 [IU]/mL (ref 0.30–0.70)

## 2024-06-04 MED ORDER — FUROSEMIDE 40 MG PO TABS
40.0000 mg | ORAL_TABLET | Freq: Two times a day (BID) | ORAL | Status: DC
  Administered 2024-06-04: 40 mg via ORAL
  Filled 2024-06-04: qty 1

## 2024-06-04 MED ORDER — WARFARIN SODIUM 6 MG PO TABS
6.0000 mg | ORAL_TABLET | Freq: Once | ORAL | Status: AC
  Administered 2024-06-04: 6 mg via ORAL
  Filled 2024-06-04: qty 1

## 2024-06-04 MED ORDER — METOPROLOL TARTRATE 25 MG PO TABS
25.0000 mg | ORAL_TABLET | Freq: Two times a day (BID) | ORAL | Status: DC
  Administered 2024-06-04 – 2024-06-05 (×3): 25 mg via ORAL
  Filled 2024-06-04 (×3): qty 1

## 2024-06-04 NOTE — Progress Notes (Signed)
 PHARMACY - ANTICOAGULATION CONSULT NOTE  Pharmacy Consult for warfarin Indication: atrial fibrillation, mechanical AVR/MVR  Allergies[1]  Patient Measurements: Height: 5' 6 (167.6 cm) Weight: 124.3 kg (274 lb) IBW/kg (Calculated) : 59.3 HEPARIN  DW (KG): 89.2  Vital Signs: Temp: 98.4 F (36.9 C) (01/05 0450) Temp Source: Oral (01/05 0450) BP: 122/63 (01/05 0450) Pulse Rate: 88 (01/05 0450)  Labs: Recent Labs    06/02/24 0809 06/03/24 0651 06/04/24 0352  HGB 8.1* 7.9* 8.2*  HCT 28.1* 27.2* 28.6*  PLT 215 236 262  LABPROT 22.1* 21.1* 23.0*  INR 1.8* 1.7* 1.9*  HEPARINUNFRC 0.63 0.53 0.43  CREATININE 1.36* 1.15* 1.18*    Estimated Creatinine Clearance: 64.9 mL/min (A) (by C-G formula based on SCr of 1.18 mg/dL (H)).  Assessment: 64 YO female with medical history significant for atrial flutter/fibrillation and hx MVR/AVR who presented with SOB found to have acute on chronic anemia 2/2 possible GIB. Pharmacy consulted to dose PTA warfarin. PTA warfarin 3.75mg  PO every day as of 11/18 (LD 1/01 @1830 ). Bridging with heparin  until INR therapeutic.  Heparin  level therapeutic, no issues with heparin  infusion or s/sx bleeding reported. INR remains labile 1.9 following increased dose x2. Will continue at higher dose. Anticipate INR will trend up quickly within the next 1-2 days and will increase cautiously.   Goal of Therapy:  Heparin  level 0.3-0.5 units/ml  INR 2.5-3.5 Monitor platelets by anticoagulation protocol: Yes   Plan:  Give warfarin 6 mg PO x1 dose Continue heparin  infusion at 1500 units/hr  Check INR daily while on warfarin  Check heparin  level daily while on heparin  Continue to monitor H&H and platelets   Thank you for allowing pharmacy to be a part of this patients care.  Shelba Collier, PharmD, BCPS Clinical Pharmacist    [1] No Known Allergies

## 2024-06-04 NOTE — Progress Notes (Addendum)
 " Progress Note   Patient: [name] FMW:[id number] DOB: [birth year] DOA: 05/31/2024     3 DOS: the patient was seen and examined on 06/04/2024    Brief hospital course: Shandale Malak is a 64 y.o. female with medical history significant of hypertension, atrial flutter, CHF, CKD, mechanical valve on warfarin who presented with progressive shortness of breath and found to be anemic on presentation with hemoglobin of 6.  Blood transfusion ordered.  GI consulted and noted patient had an EGD/colonoscopy in 2023 for iron  deficiency anemia.  These revealed gastritis and diverticulosis as well as internal hemorrhoids.  Video capsule endoscopy started on 1/3.   Patient remains at her baseline 3 L/min supplemental oxygen  (history of COPD).  Assessment and Plan:  Acute anemia in patient with known history of hemolytic anemia, iron  deficiency anemia Likely acute blood loss anemia due to possible GI bleed in the setting of chronic anticoagulation for mechanical valves versus hemolytic anemia related to mechanical valves. Low haptoglobin, elevated LDH.  Noted to be chronic in the past. Baseline hemoglobin is approximately 13.5 Patient presented with hemoglobin of 6.8 She was transfused with 2 units PRBCs. Ferritin: 12, TIBC: 456. Received IV Venofer  x 1. Underwent capsule endoscopy. Hematology consulted due to concern for hemolytic anemia. - Will continue to monitor hemoglobin. - Trend hemolysis labs  Possible GI bleed Patient with iron  deficiency anemia. S/p EGD, colonoscopy in 2023 which revealed gastritis, diverticulosis. Gastroenterology consulted.  Input appreciated. Patient is s/p video capsule endoscopy.  This showed a small clean-based gastric ulcer and gastritis. Small gastric erosion. Focal area of scalloped mucosa of proximal small bowel - nonspecific but can be seen with celiac disease which can also cause anemia.  Anticoagulation (coumadin ) likely contributed to GI bleed.   - TTG IgA and quant IgA ordered by GI. - Protonix  40 mg every day. -GI has now signed off.  Mechanical AVR, MVR Patient on warfarin with goal INR 2.5-3.5. Presented with subtherapeutic INR of 1.9 and started on heparin  bridge. Pharmacy consulted. - Continue heparin  bridge, Coumadin  per pharmacy. -If hemoglobin remains stable, patient could potentially discharge home on Lovenox  bridge.  States she has used this before.  Chronic diastolic CHF, not in exacerbation Recent TTE with EF >75%. - Resume home metoprolol . - Resume home Lasix  at reduced dose.  COPD Chronic hypoxic respiratory failure on 3 L/min supplemental oxygen  at home. -Continue home medications. - Continue home oxygen .  CKD stage III Baseline creatinine 1.1-1.5. Creatinine is at baseline. - Avoid nephrotoxins. - Daily BMP.   Chronic conditions: Gout: Continue allopurinol . Depression: Continue Prozac . Hyperlipidemia: Continue statin       Subjective: Patient states she is feeling better.  Would like to go home with Lovenox  bridge once hemoglobin stabilizes.  Physical Exam: BP (!) 122/46   Pulse 84   Temp 97.7 F (36.5 C) (Oral)   Resp 17   Ht 5' 6 (1.676 m)   Wt 124.3 kg   SpO2 98%   BMI 44.22 kg/m    General: Alert, oriented X3  Eyes: Pupils equal, reactive  Oral cavity: moist mucous membranes  Head: Atraumatic, normocephalic  Neck: supple  Chest: clear to auscultation. No crackles, no wheezes  CVS: S1,S2 RRR. No murmurs  Abd: No distention, soft, non-tender. No masses palpable  Extr: Lymphedema right lower extremity MSK: No joint deformities or swelling  Neurological: Grossly intact.    Data Reviewed:    Latest Ref Rng & Units 06/04/2024    3:52 AM 06/03/2024  6:51 AM 06/02/2024    8:09 AM  CBC  WBC 4.0 - 10.5 K/uL 9.2  7.1  7.1   Hemoglobin 12.0 - 15.0 g/dL 8.2  7.9  8.1   Hematocrit 36.0 - 46.0 % 28.6  27.2  28.1   Platelets 150 - 400 K/uL 262  236  215       Latest Ref Rng &  Units 06/04/2024    3:52 AM 06/03/2024    6:51 AM 06/02/2024    8:09 AM  BMP  Glucose 70 - 99 mg/dL 91  80  82   BUN 8 - 23 mg/dL 19  24  34   Creatinine 0.44 - 1.00 mg/dL 8.81  8.84  8.63   Sodium 135 - 145 mmol/L 140  140  139   Potassium 3.5 - 5.1 mmol/L 4.1  4.1  4.3   Chloride 98 - 111 mmol/L 104  103  102   CO2 22 - 32 mmol/L 28  28  25    Calcium  8.9 - 10.3 mg/dL 9.7  9.5  9.6      Family Communication: n/a  Disposition: Status is: Inpatient Remains inpatient appropriate because: anemia requiring evaluation, transfusion; subtherapeutic INR requiring heparin  bridge  DVT PPx: On systemic anticoagulation.      Author: MDALA-GAUSI, Criag Wicklund AGATHA, MD 06/04/2024 12:30 PM  For on call review [email or web address].    "

## 2024-06-04 NOTE — Plan of Care (Signed)

## 2024-06-04 NOTE — Care Management Important Message (Signed)
 Important Message  Patient Details  Name: [name] MRN: [id number] Date of Birth: August 12, 1960   Important Message Given:  Yes - Medicare IM     Jennie Laneta Dragon 06/04/2024, 3:19 PM

## 2024-06-04 NOTE — TOC Initial Note (Signed)
 Transition of Care (TOC) - Initial/Assessment Note   Spoke to patient at bedside. From home with daughter. Has home oxygen  with [employer].   At discharge daughter or sister will provide transportation home and bring portable oxygen  .   Patient has walker at home  Patient Details  Name: [name] MRN: [id number] Date of Birth: [birth year]  [facility] of [facility]) CM/SW Contact:    Stephane Powell Jansky, RN Phone Number: 06/04/2024, 1:10 PM  Clinical Narrative:                   Expected Discharge Plan: Home/Self Care Barriers to Discharge: Continued Medical Work up   Patient Goals and CMS Choice Patient states their goals for this hospitalization and ongoing recovery are:: to return to home   Choice offered to / list presented to : NA      Expected Discharge Plan and Services In-house Referral: NA Discharge Planning Services: CM Consult Post Acute Care Choice: NA Living arrangements for the past 2 months: Single Family Home                 DME Arranged: N/A DME Agency: NA       HH Arranged: NA HH Agency: NA        Prior Living Arrangements/Services Living arrangements for the past 2 months: Single Family Home Lives with:: Adult Children Patient language and need for interpreter reviewed:: Yes Do you feel safe going back to the place where you live?: Yes      Need for Family Participation in Patient Care: Yes (Comment) Care giver support system in place?: Yes (comment) Current home services: DME Criminal Activity/Legal Involvement Pertinent to Current Situation/Hospitalization: No - Comment as needed  Activities of Daily Living   ADL Screening (condition at time of admission) Independently performs ADLs?: Yes (appropriate for developmental age) Is the patient deaf or have difficulty hearing?: No Does the patient have difficulty seeing, even when wearing glasses/contacts?: No Does the patient have difficulty concentrating, remembering, or making  decisions?: No  Permission Sought/Granted   Permission granted to share information with : No              Emotional Assessment Appearance:: Appears stated age Attitude/Demeanor/Rapport: Engaged Affect (typically observed): Appropriate Orientation: : Oriented to Self, Oriented to Place, Oriented to  Time, Oriented to Situation Alcohol / Substance Use: Not Applicable    Admission diagnosis:  Anemia [D64.9] Acute anemia [D64.9] Patient Active Problem List   Diagnosis Date Noted   Anemia 06/01/2024   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/21/2023   Valvular heart disease 08/10/2022   Excessive daytime sleepiness 06/14/2022   Anemia in chronic kidney disease 02/17/2022   Iron  deficiency anemia 01/04/2022   Vitamin B12 deficiency 01/04/2022   Acute GI bleeding 01/03/2022   Acute on chronic blood loss anemia 01/03/2022   Anxiety 12/30/2021   Pulmonary HTN (HCC)    Acute on chronic respiratory failure with hypoxia (HCC) 05/12/2021   (HFpEF) heart failure with preserved ejection fraction (HCC) 05/12/2021   Hardware complicating wound infection 02/04/2021   Acute osteomyelitis, pelvis, left (HCC)    Postoperative hematoma of musculoskeletal structure following musculoskeletal procedure 01/19/2021   Hematoma of abdominal wall 01/03/2021   Hypovolemic shock (HCC) 12/30/2020   History of bariatric surgery 04/18/2018   Abdominal pain 02/02/2018   Hyperkalemia 02/02/2018   Acute kidney injury superimposed on CKD 02/02/2018   Hemolytic anemia    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 02/01/2018  Postoperative anemia due to chronic blood loss on full anticoagulation 02/01/2018   AKI (acute kidney injury) 02/01/2018   Acute renal failure superimposed on stage 3 chronic kidney disease (HCC) 01/26/2018   PAF (paroxysmal atrial fibrillation) (HCC)    Acute respiratory failure (HCC)    Hypoxia    Postprocedural hypotension    Chronic acquired  lymphedema - Right lower extremity 01/24/2018   S/P MVR (mitral valve replacement) 01/24/2018   Morbid obesity (HCC) 01/24/2018   Obstructive sleep apnea 02/03/2017   Hereditary hemochromatosis 06/23/2016   HX: long term anticoagulant use 02/11/2016   Chronic respiratory failure (HCC) 08/12/2015   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   Allergic rhinitis 05/02/2015   Symptomatic anemia 04/08/2015   Atrial flutter (HCC)    Chronic anticoagulation 09/04/2014   Warfarin anticoagulation 08/14/2014   CHB (complete heart block) (HCC) 08/14/2014   First degree AV block 08/14/2014   S/P AVR 08/07/2014   Hypercarbia 08/07/2014   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 [place] [address] Phone: [phone] Fax: [phone]     Social Drivers of Health (SDOH) Social History: SDOH Screenings   Food Insecurity: No Food Insecurity (06/01/2024)  Housing: Low Risk (06/01/2024)  Transportation Needs: No Transportation Needs (06/01/2024)  Utilities: Not At Risk (06/01/2024)  Financial Resource Strain: Low Risk (05/07/2024)   Received from [facility]  Physical Activity: Inactive (05/07/2024)   Received from [facility]  Social Connections: Unknown (06/01/2024)  Stress: Patient Declined (05/07/2024)   Received from [facility]  Tobacco Use: Medium Risk (05/31/2024)   SDOH  Interventions:     Readmission Risk Interventions    01/07/2022    3:26 PM  Readmission Risk Prevention Plan  Transportation Screening Complete  PCP or Specialist Appt within 3-5 Days Complete  HRI or Home Care Consult Complete  Social Work Consult for Recovery Care Planning/Counseling Complete  Palliative Care Screening Not Applicable  Medication Review Oceanographer) Complete

## 2024-06-04 NOTE — TOC Initial Note (Signed)
 Transition of Care [facility]) - Initial/Assessment Note    Patient Details  Name: [name] MRN: [id number] Date of Birth: August 18, 1960  Transition of Care [facility]) CM/SW Contact:    Jeoffrey LITTIE Moose, [place] Phone Number: 06/04/2024, 9:46 AM  Clinical Narrative:                 Pt admitted from home due to SOB with ambulation for 2 weeks. No current ICM needs, please consult as needs arise.        Patient Goals and CMS Choice            Expected Discharge Plan and Services                                              Prior Living Arrangements/Services                       Activities of Daily Living   ADL Screening (condition at time of admission) Independently performs ADLs?: Yes (appropriate for developmental age) Is the patient deaf or have difficulty hearing?: No Does the patient have difficulty seeing, even when wearing glasses/contacts?: No Does the patient have difficulty concentrating, remembering, or making decisions?: No  Permission Sought/Granted                  Emotional Assessment              Admission diagnosis:  Anemia [D64.9] Acute anemia [D64.9] Patient Active Problem List   Diagnosis Date Noted   Anemia 06/01/2024   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/21/2023   Valvular heart disease 08/10/2022   Excessive daytime sleepiness 06/14/2022   Anemia in chronic kidney disease 02/17/2022   Iron  deficiency anemia 01/04/2022   Vitamin B12 deficiency 01/04/2022   Acute GI bleeding 01/03/2022   Acute on chronic blood loss anemia 01/03/2022   Anxiety 12/30/2021   Pulmonary HTN (HCC)    Acute on chronic respiratory failure with hypoxia (HCC) 05/12/2021   (HFpEF) heart failure with preserved ejection fraction (HCC) 05/12/2021   Hardware complicating wound infection 02/04/2021   Acute osteomyelitis, pelvis, left (HCC)    Postoperative hematoma of musculoskeletal structure following  musculoskeletal procedure 01/19/2021   Hematoma of abdominal wall 01/03/2021   Hypovolemic shock (HCC) 12/30/2020   History of bariatric surgery 04/18/2018   Abdominal pain 02/02/2018   Hyperkalemia 02/02/2018   Acute kidney injury superimposed on CKD 02/02/2018   Hemolytic anemia    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 02/01/2018   Postoperative anemia due to chronic blood loss on full anticoagulation 02/01/2018   AKI (acute kidney injury) 02/01/2018   Acute renal failure superimposed on stage 3 chronic kidney disease (HCC) 01/26/2018   PAF (paroxysmal atrial fibrillation) (HCC)    Acute respiratory failure (HCC)    Hypoxia    Postprocedural hypotension    Chronic acquired lymphedema - Right lower extremity 01/24/2018   S/P MVR (mitral valve replacement) 01/24/2018   Morbid obesity (HCC) 01/24/2018   Obstructive sleep apnea 02/03/2017   Hereditary hemochromatosis 06/23/2016   HX: long term anticoagulant use 02/11/2016   Chronic respiratory failure (HCC) 08/12/2015   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   Allergic rhinitis 05/02/2015   Symptomatic anemia 04/08/2015   Atrial flutter (HCC)    Chronic anticoagulation 09/04/2014   Warfarin anticoagulation 08/14/2014   CHB (complete  heart block) (HCC) 08/14/2014   First degree AV block 08/14/2014   S/P AVR 08/07/2014   Hypercarbia 08/07/2014   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 [place] [address] Phone: [phone] Fax: [phone]     Social Drivers of Health (SDOH) Social History: SDOH Screenings   Food Insecurity: No Food Insecurity (06/01/2024)  Housing: Low Risk (06/01/2024)  Transportation Needs: No Transportation Needs (06/01/2024)  Utilities: Not At Risk (06/01/2024)  Financial Resource Strain: Low Risk (05/07/2024)   Received from [facility]  Physical Activity: Inactive (05/07/2024)   Received from [facility]  Social Connections: Unknown (06/01/2024)  Stress: Patient Declined (05/07/2024)   Received from [facility]  Tobacco Use: Medium Risk (05/31/2024)   SDOH Interventions:     Readmission Risk Interventions    01/07/2022    3:26 PM  Readmission Risk Prevention Plan  Transportation Screening Complete  PCP or Specialist Appt within 3-5 Days Complete  HRI or Home Care Consult Complete  Social Work Consult for Recovery Care Planning/Counseling Complete  Palliative Care Screening Not Applicable  Medication Review Oceanographer) Complete

## 2024-06-04 NOTE — Plan of Care (Signed)
  Problem: Education: Goal: Knowledge of General Education information will improve Description: Including pain rating scale, medication(s)/side effects and non-pharmacologic comfort measures Outcome: Progressing   Problem: Activity: Goal: Risk for activity intolerance will decrease Outcome: Progressing   Problem: Nutrition: Goal: Adequate nutrition will be maintained Outcome: Progressing   Problem: Pain Managment: Goal: General experience of comfort will improve and/or be controlled Outcome: Progressing   Problem: Safety: Goal: Ability to remain free from injury will improve Outcome: Progressing   Problem: Skin Integrity: Goal: Risk for impaired skin integrity will decrease Outcome: Progressing

## 2024-06-05 ENCOUNTER — Other Ambulatory Visit (HOSPITAL_COMMUNITY): Payer: Self-pay

## 2024-06-05 ENCOUNTER — Ambulatory Visit: Admitting: Pulmonary Disease

## 2024-06-05 ENCOUNTER — Telehealth (HOSPITAL_COMMUNITY): Payer: Self-pay | Admitting: Pharmacy Technician

## 2024-06-05 DIAGNOSIS — D509 Iron deficiency anemia, unspecified: Secondary | ICD-10-CM | POA: Diagnosis not present

## 2024-06-05 LAB — HEPATIC FUNCTION PANEL
ALT: 11 U/L (ref 0–44)
AST: 23 U/L (ref 15–41)
Albumin: 4.1 g/dL (ref 3.5–5.0)
Alkaline Phosphatase: 66 U/L (ref 38–126)
Bilirubin, Direct: 0.2 mg/dL (ref 0.0–0.2)
Indirect Bilirubin: 0.3 mg/dL (ref 0.3–0.9)
Total Bilirubin: 0.6 mg/dL (ref 0.0–1.2)
Total Protein: 7.1 g/dL (ref 6.5–8.1)

## 2024-06-05 LAB — BASIC METABOLIC PANEL WITH GFR
Anion gap: 10 (ref 5–15)
BUN: 25 mg/dL — ABNORMAL HIGH (ref 8–23)
CO2: 27 mmol/L (ref 22–32)
Calcium: 9.7 mg/dL (ref 8.9–10.3)
Chloride: 105 mmol/L (ref 98–111)
Creatinine, Ser: 1.72 mg/dL — ABNORMAL HIGH (ref 0.44–1.00)
GFR, Estimated: 33 mL/min — ABNORMAL LOW
Glucose, Bld: 79 mg/dL (ref 70–99)
Potassium: 4.5 mmol/L (ref 3.5–5.1)
Sodium: 142 mmol/L (ref 135–145)

## 2024-06-05 LAB — CBC
HCT: 28 % — ABNORMAL LOW (ref 36.0–46.0)
Hemoglobin: 8 g/dL — ABNORMAL LOW (ref 12.0–15.0)
MCH: 24.9 pg — ABNORMAL LOW (ref 26.0–34.0)
MCHC: 28.6 g/dL — ABNORMAL LOW (ref 30.0–36.0)
MCV: 87.2 fL (ref 80.0–100.0)
Platelets: 238 K/uL (ref 150–400)
RBC: 3.21 MIL/uL — ABNORMAL LOW (ref 3.87–5.11)
RDW: 17.4 % — ABNORMAL HIGH (ref 11.5–15.5)
WBC: 8.1 K/uL (ref 4.0–10.5)
nRBC: 0 % (ref 0.0–0.2)

## 2024-06-05 LAB — LACTATE DEHYDROGENASE: LDH: 441 U/L — ABNORMAL HIGH (ref 105–235)

## 2024-06-05 LAB — GLIA (IGA/G) + TTG IGA
Deamidated Gliadin Abs, IgA: 2 U (ref 0–19)
Deamidated Gliadin Abs, IgG: 2 U (ref 0–19)
Tissue Transglutaminase Ab, IgA: <2 U/mL (ref 0–3)

## 2024-06-05 LAB — PROTIME-INR
INR: 2.1 — ABNORMAL HIGH (ref 0.8–1.2)
Prothrombin Time: 24.5 s — ABNORMAL HIGH (ref 11.4–15.2)

## 2024-06-05 LAB — ERYTHROPOIETIN: Erythropoietin: 65.8 m[IU]/mL — ABNORMAL HIGH (ref 2.6–18.5)

## 2024-06-05 LAB — HEPARIN LEVEL (UNFRACTIONATED): Heparin Unfractionated: 0.5 [IU]/mL (ref 0.30–0.70)

## 2024-06-05 MED ORDER — ENOXAPARIN SODIUM 120 MG/0.8ML IJ SOSY
120.0000 mg | PREFILLED_SYRINGE | Freq: Two times a day (BID) | INTRAMUSCULAR | 0 refills | Status: AC
  Filled 2024-06-05: qty 11.2, 7d supply, fill #0

## 2024-06-05 MED ORDER — PANTOPRAZOLE SODIUM 40 MG PO TBEC
40.0000 mg | DELAYED_RELEASE_TABLET | Freq: Every day | ORAL | 2 refills | Status: AC
  Filled 2024-06-05: qty 30, 30d supply, fill #0

## 2024-06-05 MED ORDER — WARFARIN SODIUM 6 MG PO TABS: 6.0000 mg | ORAL_TABLET | Freq: Once | ORAL | Status: DC

## 2024-06-05 MED ORDER — WARFARIN SODIUM 7.5 MG PO TABS
7.5000 mg | ORAL_TABLET | Freq: Once | ORAL | Status: DC
  Filled 2024-06-05: qty 1

## 2024-06-05 MED ORDER — ENOXAPARIN SODIUM 120 MG/0.8ML IJ SOSY
120.0000 mg | PREFILLED_SYRINGE | Freq: Two times a day (BID) | INTRAMUSCULAR | Status: DC
  Administered 2024-06-05: 120 mg via SUBCUTANEOUS
  Filled 2024-06-05 (×2): qty 0.8

## 2024-06-05 NOTE — Plan of Care (Signed)

## 2024-06-05 NOTE — Telephone Encounter (Signed)
 Patient Product/process Development Scientist completed.    The patient is insured through [employer]. Patient has Medicare and is not eligible for a copay card, but may be able to apply for patient assistance or Medicare RX Payment Plan (Patient Must reach out to their plan, if eligible for payment plan), if available.    Ran test claim for enoxaparin  120 mg and the current 7 day co-pay is $1.60.   This test claim was processed through [facility]- copay amounts may vary at other pharmacies due to pharmacy/plan contracts, or as the patient moves through the different stages of their insurance plan.     Reyes Sharps, CPHT Pharmacy Technician Patient Advocate Specialist Lead [facility] Patient Advocate Team Direct Number: [phone]  Fax: [phone]

## 2024-06-05 NOTE — Discharge Summary (Signed)
 " Physician Discharge Summary   Patient: [name] MRN: [id number] DOB: 05/06/1961  Admit date:     05/31/2024  Discharge date: 06/05/2024  Discharge Physician: MDALA-GAUSI, GOLDEN PILLOW   PCP: Emilio Joesph DEL, PA-C   Recommendations at discharge:   Follow-up with anticoagulation clinic within 1 week  Discharge Diagnoses: Principal Problem:   Iron  deficiency anemia Active Problems:   Anemia  Resolved Problems:   * No resolved hospital problems. *  Hospital Course: Tiah Heckel is a 64 y.o. female with medical history significant of hypertension, atrial flutter, CHF, CKD, mechanical valve on warfarin who presented with progressive shortness of breath and found to be anemic on presentation with hemoglobin of 6.  Blood transfusion ordered.  GI consulted and noted patient had an EGD/colonoscopy in 2023 for iron  deficiency anemia.  These revealed gastritis and diverticulosis as well as internal hemorrhoids.  Video capsule endoscopy was done on 1/3.   Patient remained at her baseline 3 L/min supplemental oxygen  (history of COPD).  The rest of the hospital course is in problem-based format below  Assessment and Plan:  Acute anemia in patient with known history of hemolytic anemia, iron  deficiency anemia Likely acute blood loss anemia due to possible GI bleed in the setting of chronic anticoagulation for mechanical valves versus hemolytic anemia related to mechanical valves. Low haptoglobin, elevated LDH.  Noted to be chronic in the past. Baseline hemoglobin is approximately 13.5 Patient presented with hemoglobin of 6.8 She was transfused with 2 units PRBCs. Ferritin: 12, TIBC: 456. Received IV Venofer  x 1. Underwent capsule endoscopy. Hematology consulted due to concern for hemolytic anemia.  They recommended iron  repletion and outpatient follow-up.   Possible GI bleed Patient with iron  deficiency anemia. S/p EGD, colonoscopy in 2023 which revealed gastritis,  diverticulosis. Gastroenterology consulted.  Input appreciated. Patient is s/p video capsule endoscopy.  This showed a small clean-based gastric ulcer and gastritis. Small gastric erosion. Focal area of scalloped mucosa of proximal small bowel - nonspecific but can be seen with celiac disease which can also cause anemia.  Anticoagulation (coumadin ) likely contributed to GI bleed.  TTG IgA and quant IgA ordered by GI and were negative. Patient was started on daily PPI.   Mechanical AVR, MVR Patient on warfarin with goal INR 2.5-3.5. Presented with subtherapeutic INR of 1.9 and started on heparin  bridge. Pharmacy consulted. Patient was transitioned to subcutaneous Lovenox  for bridging at discharge.  INR was 2.1 on discharge day. Patient to continue with subcutaneous Lovenox  as well as adjusted dose of Coumadin  and will follow-up in anticoagulation clinic.  Chronic diastolic CHF, not in exacerbation Recent TTE with EF >75%. Home metoprolol  was continued. Home Lasix  was held due to elevated creatinine and patient instructed to resume in 4 days.   COPD Chronic hypoxic respiratory failure on 3 L/min supplemental oxygen  at home. Home medications and home oxygen  were continued.   CKD stage III Baseline creatinine 1.1-1.5. Creatinine remained close to baseline.  Chronic conditions: Gout: Continued allopurinol . Depression: Continued Prozac . Hyperlipidemia: Continued statin       Consultants: Hematology, gastroenterology Procedures performed: Capsule endoscopy Disposition: Home Diet recommendation:  Discharge Diet Orders (From admission, onward)     Start     Ordered   06/05/24 0000  Diet - low sodium heart healthy        06/05/24 1331           Regular diet DISCHARGE MEDICATION: Allergies as of 06/05/2024   No Known Allergies  Medication List     PAUSE taking these medications    furosemide  40 MG tablet Wait to take this until: June 09, 2024 Commonly known  as: LASIX  TAKE 1.5 TABLETS (60 MG TOTAL) BY MOUTH 2 (TWO) TIMES DAILY. What changed: how much to take   potassium chloride  SA 20 MEQ tablet Wait to take this until: June 09, 2024 Commonly known as: KLOR-CON  M TAKE 1 TABLET BY MOUTH EVERY DAY What changed: how much to take       TAKE these medications    albuterol  108 (90 Base) MCG/ACT inhaler Commonly known as: VENTOLIN  HFA Inhale 2 puffs into the lungs as needed for wheezing or shortness of breath.   allopurinol  300 MG tablet Commonly known as: ZYLOPRIM  Take 300 mg by mouth at bedtime.   Anoro Ellipta  62.5-25 MCG/ACT Aepb Generic drug: umeclidinium-vilanterol INHALE 1 PUFF BY MOUTH EVERY DAY   cetirizine  10 MG tablet Commonly known as: ZYRTEC  Take 10 mg by mouth daily.   enoxaparin  120 MG/0.8ML injection Commonly known as: LOVENOX  Inject 0.8 mLs (120 mg total) into the skin 2 (two) times daily.   FLUoxetine  20 MG capsule Commonly known as: PROZAC  Take 20 mg by mouth daily.   metoprolol  tartrate 25 MG tablet Commonly known as: LOPRESSOR  Take 1 tablet (25 mg total) by mouth 2 (two) times daily.   montelukast  10 MG tablet Commonly known as: SINGULAIR  Take 10 mg by mouth daily.   OXYGEN  Inhale 3 L into the lungs continuous.   pantoprazole  40 MG tablet Commonly known as: PROTONIX  Take 1 tablet (40 mg total) by mouth daily. Start taking on: June 06, 2024   pravastatin  80 MG tablet Commonly known as: PRAVACHOL  Take 80 mg by mouth daily.   warfarin 7.5 MG tablet Commonly known as: COUMADIN  Take as directed. If you are unsure how to take this medication, talk to your nurse or doctor. Original instructions: Take 7.5 mg daily for the next 3 days (06/05/24 - 06/07/24) and then revert to your prior dose of 3.75 mg daily. Please follow up in Anticoagulation clinic. What changed: additional instructions        Discharge Exam: Filed Weights   06/01/24 9386  Weight: 124.3 kg   Physical Exam on Day of  Discharge   General: Alert, cheerful, oriented X3  Oral cavity: moist mucous membranes  Neck: supple  Chest: clear to auscultation. No crackles, no wheezes  CVS: S1,S2 RRR. No murmurs  Abd: No distention, soft, non-tender. No masses palpable  Extr: No edema    Condition at discharge: stable  The results of significant diagnostics from this hospitalization (including imaging, microbiology, ancillary and laboratory) are listed below for reference.   Imaging Studies: DG Chest 2 View Result Date: 05/31/2024 EXAM: 2 VIEW(S) XRAY OF THE CHEST 05/31/2024 08:39:00 PM COMPARISON: None available. CLINICAL HISTORY: sob FINDINGS: LUNGS AND PLEURA: No focal pulmonary opacity. No pleural effusion. No pneumothorax. HEART AND MEDIASTINUM: Cardiomegaly. Status post aortic and mitral valve replacement. Calcified aorta. BONES AND SOFT TISSUES: Thoracic degenerative changes. Intact median sternotomy wires. IMPRESSION: 1. No acute cardiopulmonary abnormality. 2. Cardiomegaly Electronically signed by: Dorethia Molt MD 05/31/2024 08:47 PM EST RP Workstation: [id number]    Microbiology: Results for orders placed or performed during the hospital encounter of 01/02/22  Resp Panel by RT-PCR (Flu A&B, Covid) Anterior Nasal Swab     Status: None   Collection Time: 01/02/22 10:00 PM   Specimen: Anterior Nasal Swab  Result Value Ref Range Status   SARS  Coronavirus 2 by RT PCR NEGATIVE NEGATIVE Final    Comment: (NOTE) SARS-CoV-2 target nucleic acids are NOT DETECTED.  The SARS-CoV-2 RNA is generally detectable in upper respiratory specimens during the acute phase of infection. The lowest concentration of SARS-CoV-2 viral copies this assay can detect is 138 copies/mL. A negative result does not preclude SARS-Cov-2 infection and should not be used as the sole basis for treatment or other patient management decisions. A negative result may occur with  improper specimen collection/handling, submission of specimen  other than nasopharyngeal swab, presence of viral mutation(s) within the areas targeted by this assay, and inadequate number of viral copies(<138 copies/mL). A negative result must be combined with clinical observations, patient history, and epidemiological information. The expected result is Negative.  Fact Sheet for Patients:  bloggercourse.com  Fact Sheet for Healthcare Providers:  seriousbroker.it  This test is no t yet approved or cleared by the [place]  FDA and  has been authorized for detection and/or diagnosis of SARS-CoV-2 by FDA under an Emergency Use Authorization (EUA). This EUA will remain  in effect (meaning this test can be used) for the duration of the COVID-19 declaration under Section 564(b)(1) of the Act, 21 U.S.C.section 360bbb-3(b)(1), unless the authorization is terminated  or revoked sooner.       Influenza A by PCR NEGATIVE NEGATIVE Final   Influenza B by PCR NEGATIVE NEGATIVE Final    Comment: (NOTE) The Xpert Xpress SARS-CoV-2/FLU/RSV plus assay is intended as an aid in the diagnosis of influenza from Nasopharyngeal swab specimens and should not be used as a sole basis for treatment. Nasal washings and aspirates are unacceptable for Xpert Xpress SARS-CoV-2/FLU/RSV testing.  Fact Sheet for Patients: bloggercourse.com  Fact Sheet for Healthcare Providers: seriousbroker.it  This test is not yet approved or cleared by the [place]  FDA and has been authorized for detection and/or diagnosis of SARS-CoV-2 by FDA under an Emergency Use Authorization (EUA). This EUA will remain in effect (meaning this test can be used) for the duration of the COVID-19 declaration under Section 564(b)(1) of the Act, 21 U.S.C. section 360bbb-3(b)(1), unless the authorization is terminated or revoked.  Performed at [facility], [address] [place],  [place] [address]    *Note: Due to a large number of results and/or encounters for the requested time period, some results have not been displayed. A complete set of results can be found in Results Review.    Labs: CBC: Recent Labs  Lab 06/01/24 1344 06/02/24 0809 06/03/24 0651 06/04/24 0352 06/05/24 0430  WBC 8.9 7.1 7.1 9.2 8.1  HGB 8.6* 8.1* 7.9* 8.2* 8.0*  HCT 29.3* 28.1* 27.2* 28.6* 28.0*  MCV 86.7 86.2 86.1 86.7 87.2  PLT 246 215 236 262 238   Basic Metabolic Panel: Recent Labs  Lab 05/31/24 2051 06/02/24 0809 06/03/24 0651 06/04/24 0352 06/05/24 0430  NA 139 139 140 140 142  K 5.2* 4.3 4.1 4.1 4.5  CL 101 102 103 104 105  CO2 30 25 28 28 27   GLUCOSE 88 82 80 91 79  BUN 39* 34* 24* 19 25*  CREATININE 1.60* 1.36* 1.15* 1.18* 1.72*  CALCIUM  9.5 9.6 9.5 9.7 9.7   Liver Function Tests: Recent Labs  Lab 06/02/24 0809 06/03/24 0651 06/04/24 0352 06/05/24 0430  AST 30 23 25 23   ALT 9 10 12 11   ALKPHOS 70 69 71 66  BILITOT 0.9 0.8 0.7 0.6  PROT 7.3 7.1 7.2 7.1  ALBUMIN  4.1 4.0 4.1 4.1  CBG: No results for input(s): GLUCAP in the last 168 hours.  Discharge time spent: greater than 30 minutes.  Signed: MDALA-GAUSI, Milad Bublitz AGATHA, MD [facility] 06/05/2024 "

## 2024-06-05 NOTE — Progress Notes (Signed)
 PHARMACY - ANTICOAGULATION CONSULT NOTE  Pharmacy Consult for warfarin Indication: atrial fibrillation, mechanical AVR/MVR  Allergies[1]  Patient Measurements: Height: 5' 6 (167.6 cm) Weight: 124.3 kg (274 lb) IBW/kg (Calculated) : 59.3 HEPARIN  DW (KG): 89.2  Vital Signs: Temp: 98 F (36.7 C) (01/06 0510) Temp Source: Oral (01/06 0510) BP: 128/56 (01/06 0510) Pulse Rate: 57 (01/06 0510)  Labs: Recent Labs    06/03/24 0651 06/04/24 0352 06/05/24 0430  HGB 7.9* 8.2* 8.0*  HCT 27.2* 28.6* 28.0*  PLT 236 262 238  LABPROT 21.1* 23.0* 24.5*  INR 1.7* 1.9* 2.1*  HEPARINUNFRC 0.53 0.43 0.50  CREATININE 1.15* 1.18* 1.72*    Estimated Creatinine Clearance: 44.5 mL/min (A) (by C-G formula based on SCr of 1.72 mg/dL (H)).  Assessment: 64 YO female with medical history significant for atrial flutter/fibrillation and hx MVR/AVR who presented with SOB found to have acute on chronic anemia 2/2 possible GIB. Pharmacy consulted to dose PTA warfarin. PTA warfarin 3.75mg  PO every day as of 11/18 (LD 1/01 @1830 ). Bridging with heparin  until INR therapeutic.  Heparin  level therapeutic, no issues with heparin  infusion or s/sx bleeding reported. INR trending up slowly - up at 2.1 following increased dose x2. Will continue at higher dose. Anticipate INR will trend up quickly within the next 1-2 days and will increase cautiously.   Goal of Therapy:  Heparin  level 0.3-0.5 units/ml  INR 2.5-3.5 Monitor platelets by anticoagulation protocol: Yes   Plan:  Give warfarin 7.5 mg PO x1 dose Continue heparin  infusion at 1450 units/hr  Check INR daily while on warfarin  Check heparin  level daily while on heparin  Continue to monitor H&H and platelets   Thank you for allowing pharmacy to be a part of this patients care.  Shelba Collier, PharmD, BCPS Clinical Pharmacist     [1] No Known Allergies

## 2024-06-06 ENCOUNTER — Telehealth: Payer: Self-pay | Admitting: Oncology

## 2024-06-06 NOTE — Telephone Encounter (Signed)
-----   Message from Nurse Selinda BRAVO, RN sent at 06/06/2024  7:29 AM EST ----- Received this from Dr. Lonn. Just wanted to make sure we got her scheduled ----- Message ----- From: Lonn Hicks, MD Sent: 06/05/2024   3:42 PM EST To: Valaria DELENA Kerns, MD; Selinda DELENA An, RN  Hi Dequincy,  I saw her in the hospital She received IV iron , may need Procrit  Can you schedule follow-up?  Thanks

## 2024-06-06 NOTE — Telephone Encounter (Signed)
 Contacted pt to schedule an appt. Unable to reach via phone, voicemail was left.

## 2024-06-12 ENCOUNTER — Ambulatory Visit

## 2024-06-14 NOTE — Anesthesia Postprocedure Evaluation (Signed)
 Patient: [name]  Procedure Summary     Date: 06/14/24 Room / Location: [facility] ENDOSCOPY   Anesthesia Start: 1441 Anesthesia Stop: 1450   Procedure: ESOPHAGOGASTRODUODENOSCOPY Diagnosis:      Anemia due to blood loss     Lesion of gastric mucosa   Scheduled Providers: Shanna JINNY Denmark, MD; Norman Blush Derr, MD; Almarie Gretel Miu, CRNA Responsible Provider: Norman Blush Derr, MD   Anesthesia Type: MAC ASA Status: 4       Anesthesia Type: MAC  Vitals BP: 115/60 (06/14/2024  2:03 PM) Temp: 97.6 F (36.4 C) (06/14/2024  2:03 PM) Temp Source: Skin (06/14/2024  2:03 PM) Heart Rate: 81 (06/14/2024  2:03 PM) Resp: 17 (06/14/2024  2:03 PM) SpO2: 97 % (06/14/2024  2:03 PM)   There were no known notable events for this encounter.  Anesthesia Post Evaluation  Final anesthesia type: MAC Patient location during evaluation: PACU Patient participation: Patient participated Level of consciousness: awake and alert Pain score: pain well controlled (patient comfortable/resting) Pain management: adequately controlled during entire PACU stay Post-op nausea and vomiting?: none Post-op vital signs: post-procedure vital signs are stable Patient temperature: Normothermic Cardiovascular status: hemodynamically stable Respiratory status: Stable, room air, spontaneous Hydration status: adequately hydrated Post-op disposition: Inpatient Floor Anesthesia post-op complications?:no complications

## 2024-06-15 NOTE — Progress Notes (Signed)
 Case Management Update  Date: 06/15/2024   Time: 4:18 PM   Patient Type: Inpatient  Per CAPP- physician advised will monitor Hgb through the weekend, pt had EGD on 06/14/24. +tranfusions. Case management will follow and address dc needs.          Anticipated Discharge Location: To be determined  Sherrilyn CHRISTELLA Maser, MSW

## 2024-06-15 NOTE — Consults (Signed)
 "   New Patient Hematology/Oncology Evaluation  [name] is a 64 y.o. 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  Patient presented to [facility] emergency department on 06/01/2024 with increasing shortness of breath.  She at that time denied any blood loss although she has had previous episodes of GI bleeding in the past.  Labs performed in the ER showed hemoglobin 6.8 hematocrit 25.1.  She was also found to have an elevated creatinine of 1.6 (patient does have chronic kidney disease).  Patient was admitted to [facility] and she was given 2 units of blood transfusion as well as IV iron .  She was discharged to home with Lovenox  bridge until Coumadin  level was therapeutic.  GI evaluation apparently occurred outside of hospital and involved a pill endoscopy with no clear source of bleeding found.  Prior to admission to [facility] patient was evaluated at [facility] with weakness and left index finger discoloration.  She had CBC performed which showed a hemoglobin of 7.0 and INR that was supratherapeutic at 7.0 and heme positive stools.  Patient was given 1 unit of packed red blood cells and vitamin K at [facility].  She was then subsequently transferred to [facility] for GI evaluation.    At the time of admission to [facility] CBC performed showed hemoglobin 7.0 hematocrit 22.7 white count was normal 7.49 and platelets 192,000.  INR 1.6 with prothrombin time 19.4.  Anemia profile showed transferrin saturation 9 total iron  32 and ferritin low normal at 20.  BMP showed creatinine 1.38 with BUN 51.  Patient was transfused with packed red blood cells but in spite of that she continued to be anemic.  She was seen by GI  and she underwent EGD that showed esophageal mucosa normal she was noted to have small superficial 5-6 mm ulceration in the antrum with no bleeding.  The site has been cauterized with gold probe) 1st and 2nd part of duodenum appeared normal.  Colonoscopy showed mild left-sided diverticulosis with medium internal hemorrhoids seen on rectal retroflexion.  In spite of blood transfusions patient's hemoglobin remains low, patient had a haptoglobin <30, mildly elevated LDH 385, elevation in reticulocyte 4.9% (absolute reticulocyte 147.), DAT negative.  Liver function studies performed today show total bilirubin 0.6, direct bilirubin 0.1 and hemoglobin 7.4 with hematocrit 23.8.  Review of patient's prior medical records reveals that she has been seen by Dr. 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  And she tells me that she is due to be seen by Dr. Kerns next week.  Patient has been diagnosed with pneumonia and is on antibiotics.  Clinically she is doing well, she is currently not complaining of shortness of breath.  She denies any hematuria hematochezia melena hemoptysis or hematemesis.   Allergies: Patient has no known allergies. Medications: Current Medications[1] Scheduled Meds: allopurinoL , 300 mg, oral, Daily cefTRIAXone , 2 g, intravenous, Q24H enoxaparin , 1 mg/kg, subcutaneous, Q12H SCH furosemide , 40 mg, oral, BID ipratropium-albuterol , 3 mL, nebulization, RTID metoprolol  tartrate, 12.5 mg, oral, Q12H SCH montelukast , 10 mg, oral, At Bedtime mupirocin, 1 Application, Each Nostril, BID pravastatin , 80 mg, oral, At Bedtime   PRN Meds:   acetaminophen    albuterol    dextrose   dextrose    melatonin oral (liquid/tablet)   ondansetron    ondansetron  Past Medical History: Medical History[2] Personal and Social History: Social History[3] Family History: Cancer-related family history is not on file. has no family status  information on file.    Review of Systems: A complete review of systems was obtained including: Constitutional, Eyes, ENT, Cardiovascular, Respiratory, GI, GU, Musculoskeletal, Skin, Neurological, Psychiatric, Endocrine, Heme/Lymphatic, and Allergic/Immunologic systems. It is negative or non-contributory to the patients management except for as stated in this note.  Objective: Vital signs for last 24 hours: Temp:  [97.3 F (36.3 C)-98.4 F (36.9 C)] 97.9 F (36.6 C) Heart Rate:  [65-89] 73 Resp:  [17-20] 20 BP: (111-134)/(52-74) 121/54 Intake/Output last 24 hours:  Intake/Output Summary (Last 24 hours) at 06/15/2024 1726 Last data filed at 06/15/2024 1538 Gross per 24 hour  Intake 600 ml  Output --  Net 600 ml    Physical Examination: Vital Signs: BP (!) 121/54 (BP Location: Left arm, Patient Position: Lying)   Pulse 73   Temp 97.9 F (36.6 C) (Oral)   Resp 20   Ht 1.676 m (5' 6)   Wt 124 kg (272 lb 12.8 oz)   SpO2 91%   BMI 44.03 kg/m  General: Pleasant female sitting comfortably in her bed, ECOG 1 HEENT exam: Sclera anicteric conjunctival pallor is noted neck supple no palpable adenopathy Lungs: Distant breath sounds bilaterally Cardiovascular: Regular rhythm Abdomen obese nontender not able to palpate spleen or liver Extremities: No edema no petechiae Neuro: Alert oriented otherwise nonfocal  Pertinent labs, xrays and pathology reports reviewed. Recent Results (from the past 48 hours)  SARS-Cov-2, Flu, and RSV, Qualitative NAAT   Collection Time: 06/13/24  6:10 PM  Result Value Ref Range   SARS-CoV-2 Negative Negative   Influenza A Negative Negative   Influenza B Negative Negative   RSV Negative Negative  Hemoglobin and Hematocrit   Collection Time: 06/13/24  7:43 PM  Result Value Ref Range   Hemoglobin 7.1 (L) 12.3 - 15.3 g/dL   Hematocrit 77.6 (L) 64.0 - 44.6 %  Basic Metabolic Panel   Collection Time: 06/14/24  2:52 AM  Result Value Ref Range   Sodium  138 136 - 145 mmol/L   Potassium 3.7 3.4 - 4.5 mmol/L   Chloride 107 98 - 107 mmol/L   CO2 26 21 - 31 mmol/L   Anion Gap 5 (L) 6 - 14 mmol/L   Glucose, Random 110 (H) 70 - 99 mg/dL   Blood Urea Nitrogen (BUN) 32 (H) 7 - 25 mg/dL   Creatinine 8.59 (H) 9.39 - 1.20 mg/dL   eGFR 42 (L) >40 fO/fpw/8.26f7   Calcium  9.1 8.6 - 10.3 mg/dL   BUN/Creatinine Ratio 22.9 (H) 10.0 - 20.0  Magnesium    Collection Time: 06/14/24  2:52 AM  Result Value Ref Range   Magnesium  2.0 1.9 - 2.7 mg/dL  Phosphorus   Collection Time: 06/14/24  2:52 AM  Result Value Ref Range   Phosphorus 2.8 2.5 - 5.0 mg/dL  B-Type Natriuretic Peptide (BNP)   Collection Time: 06/14/24  2:52 AM  Result Value Ref Range   B-Type Natriuretic Peptide (BNP) 219 (H) <100 pg/mL  CBC with Differential   Collection Time: 06/14/24  2:52 AM  Result Value Ref Range   WBC 10.98 4.40 - 11.00 10*3/uL   RBC 2.56 (L) 4.10 - 5.10 10*6/uL   Hemoglobin 6.8 (LL) 12.3 - 15.3 g/dL   Hematocrit 78.3 (L) 64.0 - 44.6 %   Mean Corpuscular  Volume (MCV) 84.2 80.0 - 96.0 fL   Mean Corpuscular Hemoglobin (MCH) 26.5 (L) 27.5 - 33.2 pg   Mean Corpuscular Hemoglobin Conc (MCHC) 31.5 (L) 33.0 - 37.0 g/dL   Red Cell Distribution Width (RDW) 21.1 (H) 12.3 - 17.0 %   Platelet Count (PLT) 202 150 - 450 10*3/uL   Mean Platelet Volume (MPV) 9.2 6.8 - 10.2 fL   Neutrophils % 80 %   Lymphocytes % 5 %   Monocytes % 12 %   Eosinophils % 3 %   Basophils % 1 %   Neutrophils Absolute 8.80 (H) 1.80 - 7.80 10*3/uL   Lymphocytes # 0.50 (L) 1.00 - 4.80 10*3/uL   Monocytes # 1.30 (H) 0.00 - 0.80 10*3/uL   Eosinophils # 0.30 0.00 - 0.50 10*3/uL   Basophils # 0.10 0.00 - 0.20 10*3/uL  Type and screen   Collection Time: 06/14/24  5:25 AM  Result Value Ref Range   Component Type RED CELLS    Crossmatch Expiration 06-17-2024,2359    Type & Rh (ABORH) O Positive    Antibody Screen NEG   Prepare RBC (Sunquest Info)   Collection Time: 06/14/24  5:25 AM  Result Value  Ref Range   Unit Number [id number]    Component Type LEUKORED. PC    Blood Unit Division 00    Dispense Status ISSUED,FINAL    Unit Transfusion Status OK TO TRANSFUSE    Crossmatch Electronically Compatible    Blood Issue/Date Time [id number]    Blood Product Code E0336V00    Unit ABO/Rh O POS    Blood Type 5100    Product Expiration Date [id number]   Direct antiglobulin test   Collection Time: 06/14/24  5:25 AM  Result Value Ref Range   DAT Broad Spectrum NEG   Prothrombin Time (PT) with INR   Collection Time: 06/14/24  8:33 AM  Result Value Ref Range   Prothrombin Time (PT) 16.5 (H) 11.8 - 14.4 seconds   International Normalized Ratio (INR) 1.3 0.0 - 1.5  Hemoglobin and Hematocrit   Collection Time: 06/14/24  1:36 PM  Result Value Ref Range   Hemoglobin 7.9 (L) 12.3 - 15.3 g/dL   Hematocrit 74.5 (L) 64.0 - 44.6 %  Reticulocyte Count   Collection Time: 06/14/24  1:36 PM  Result Value Ref Range   Reticulocyte Absolute 147.0 (H) 25.0 - 75.0 10*3/uL   Reticulocyte % 4.9 (H) 0.5 - 2.2 %  Lactate Dehydrogenase (LDH)   Collection Time: 06/14/24  6:45 PM  Result Value Ref Range   Lactate Dehydrogenase (LDH) 385 (H) 140 - 271 U/L  Haptoglobin   Collection Time: 06/14/24  6:45 PM  Result Value Ref Range   Haptoglobin <30 (L) 44 - 215 mg/dL  Prothrombin Time (PT) with INR   Collection Time: 06/14/24  6:45 PM  Result Value Ref Range   Prothrombin Time (PT) 15.4 (H) 11.8 - 14.4 seconds   International Normalized Ratio (INR) 1.2 0.0 - 1.5  Hemoglobin and Hematocrit   Collection Time: 06/14/24  6:45 PM  Result Value Ref Range   Hemoglobin 8.0 (L) 12.3 - 15.3 g/dL   Hematocrit 74.7 (L) 64.0 - 44.6 %  Basic Metabolic Panel   Collection Time: 06/15/24  2:26 AM  Result Value Ref Range   Sodium 139 136 - 145 mmol/L   Potassium 4.2 3.4 - 4.5 mmol/L   Chloride 109 (H) 98 - 107 mmol/L   CO2 24 21 - 31 mmol/L   Anion Gap 6  6 - 14 mmol/L   Glucose, Random 90 70 - 99 mg/dL    Blood Urea Nitrogen (BUN) 30 (H) 7 - 25 mg/dL   Creatinine 8.86 9.39 - 1.20 mg/dL   eGFR 54 (L) >40 fO/fpw/8.26f7   Calcium  9.2 8.6 - 10.3 mg/dL   BUN/Creatinine Ratio    Magnesium    Collection Time: 06/15/24  2:26 AM  Result Value Ref Range   Magnesium  2.3 1.9 - 2.7 mg/dL  Phosphorus   Collection Time: 06/15/24  2:26 AM  Result Value Ref Range   Phosphorus 3.6 2.5 - 5.0 mg/dL  CBC with Differential   Collection Time: 06/15/24  2:26 AM  Result Value Ref Range   WBC 9.14 4.40 - 11.00 10*3/uL   RBC 2.79 (L) 4.10 - 5.10 10*6/uL   Hemoglobin 7.4 (L) 12.3 - 15.3 g/dL   Hematocrit 76.4 (L) 64.0 - 44.6 %   Mean Corpuscular Volume (MCV) 83.9 80.0 - 96.0 fL   Mean Corpuscular Hemoglobin (MCH) 26.6 (L) 27.5 - 33.2 pg   Mean Corpuscular Hemoglobin Conc (MCHC) 31.7 (L) 33.0 - 37.0 g/dL   Red Cell Distribution Width (RDW) 20.8 (H) 12.3 - 17.0 %   Platelet Count (PLT) 231 150 - 450 10*3/uL   Mean Platelet Volume (MPV) 8.7 6.8 - 10.2 fL   Neutrophils % 80 %   Lymphocytes % 6 %   Monocytes % 12 %   Eosinophils % 3 %   Basophils % 0 %   Neutrophils Absolute 7.30 1.80 - 7.80 10*3/uL   Lymphocytes # 0.50 (L) 1.00 - 4.80 10*3/uL   Monocytes # 1.10 (H) 0.00 - 0.80 10*3/uL   Eosinophils # 0.20 0.00 - 0.50 10*3/uL   Basophils # 0.00 0.00 - 0.20 10*3/uL  Liver Function Panel   Collection Time: 06/15/24  2:26 AM  Result Value Ref Range   Albumin  3.6 3.5 - 5.7 g/dL   Bilirubin, Total 0.6 0.3 - 1.0 mg/dL   Bilirubin, Direct 0.1 0.0 - 0.2 mg/dL   Alkaline Phosphatase (ALP) 44 34 - 104 U/L   Aspartate Aminotransferase (AST) 24 13 - 39 U/L   Alanine Aminotransferase (ALT) 9 7 - 52 U/L   Total Protein 6.5 6.4 - 8.9 g/dL  Hemoglobin and Hematocrit   Collection Time: 06/15/24  8:54 AM  Result Value Ref Range   Hemoglobin 7.4 (L) 12.3 - 15.3 g/dL   Hematocrit 76.1 (L) 64.0 - 44.6 %    Assessment: 64 year old female with prior history of acquired autoimmune hemolytic anemia which was  well-controlled.  Admitted to [facility] with iron  deficiency and possible bleeding.  She received blood transfusions and subsequently was set up for an outpatient follow-up with GI.  Patient has been on chronic anticoagulation for her mechanical valves.  She is admitted to [facility] with supratherapeutic INR and upper GI bleed.  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  She did have iron  deficiency when she presented to [facility] earlier at the beginning of this year and was transfused with packed red blood cells as well as IV iron .  However in spite of that her iron  remains on the lower side.  Review of peripheral smear does show occasional fragmented red blood cells,microcytosis, no spherocytosis.  Platelets and white  cell morphology normal.  Recommendation: -Recommend repeating iron  studies, suggest oral iron  with folic acid , IV iron  once infection is controlled -Transfuse packed red blood cells as needed for symptom management - Due to mechanical valve would recommend echocardiogram and cardiology evaluation -I will continue to follow the patient with you.      [1] Current Facility-Administered Medications  Medication Dose Route Frequency Provider Last Rate Last Admin   acetaminophen  (TYLENOL ) tablet 650 mg  650 mg oral Q6H PRN Jama Rome Louder, MD   650 mg at 06/14/24 2012   albuterol  sulfate 2.5 mg/0.5 mL nebulizer solution 2.5 mg  2.5 mg nebulization Q6H PRN Basavatti Madappa Sowmya, MD       allopurinoL  (ZYLOPRIM ) tablet 300 mg  300 mg oral Daily Jama Rome Louder, MD   300 mg at 06/15/24 9047   cefTRIAXone  (ROCEPHIN ) injection 2 g  2 g intravenous Q24H Basavatti Madappa Sowmya, MD    2 g at 06/15/24 1623   dextrose  (D50W) 50 % injection 12.5 g  12.5 g intravenous PRN Jama Rome Louder, MD       dextrose  (GLUTOSE) 40 % oral gel 15 g  15 g oral PRN Jama Rome Louder, MD       enoxaparin  (LOVENOX ) syringe 120 mg  1 mg/kg subcutaneous Q12H [facility] Basavatti Madappa Sowmya, MD   120 mg at 06/15/24 9046   furosemide  (LASIX ) tablet 40 mg  40 mg oral BID Basavatti Madappa Sowmya, MD       ipratropium-albuterol  (DUO-NEB) 0.5-2.5 mg/3 mL nebulizer solution 3 mL  3 mL nebulization RTID Basavatti Madappa Sowmya, MD   3 mL at 06/15/24 1425   melatonin tablet 3 mg  3 mg oral At Bedtime PRN Jama Rome Louder, MD       metoprolol  tartrate (LOPRESSOR ) split tablet 12.5 mg  12.5 mg oral Q12H SCH Santosh K Dhungana, MD   12.5 mg at 06/15/24 9047   montelukast  (SINGULAIR ) tablet 10 mg  10 mg oral At Bedtime Jama Rome Louder, MD   10 mg at 06/14/24 2006   mupirocin (BACTROBAN) 2 % ointment 1 Application  1 Application Each Nostril BID [place] Otha Olives, MD   1 Application at 06/15/24 0953   ondansetron  (ZOFRAN ) injection 4 mg  4 mg intravenous Q6H PRN Jama Rome Louder, MD       ondansetron  (ZOFRAN ) injection 4 mg  4 mg intravenous Q6H PRN Caprice Neill Mose, FNP       pravastatin  (PRAVACHOL ) tablet 80 mg  80 mg oral At Bedtime Jama Rome Louder, MD   80 mg at 06/14/24 2006   Facility-Administered Medications Ordered in Other Encounters  Medication Dose Route Frequency Provider Last Rate Last Admin   lactated ringer 's infusion   intravenous Continuous PRN Honora Garnette Duncan, CRNA   New Bag at 06/11/24 0800  [2] Past Medical History: Diagnosis Date   Former smoker    quit in 2015  [3] Social History Socioeconomic History   Marital status: Single  Tobacco Use   Smoking status: Former    Types: Cigarettes   Smokeless tobacco: Former   Social Drivers of Health   Living Situation: Low Risk (06/10/2024)   Living Situation    What is your living situation  today?: I have a steady place to live    Think about the place you live. Do you have problems with any of the following? Choose all that apply:: None/None on this list  Food Insecurity: Medium Risk (06/10/2024)   Food vital  sign    Within the past 12 months, you worried that your food would run out before you got money to buy more: Sometimes true    Within the past 12 months, the food you bought just didn't last and you didn't have money to get more: Sometimes true  Transportation Needs: No Transportation Needs (06/10/2024)   Transportation    In the past 12 months, has lack of reliable transportation kept you from medical appointments, meetings, work or from getting things needed for daily living? : No  Utilities: Low Risk (06/10/2024)   Utilities    In the past 12 months has the electric, gas, oil, or water  company threatened to shut off services in your home? : No  Safety: Low Risk (06/10/2024)   Safety    How often does anyone, including family and friends, physically hurt you?: Never    How often does anyone, including family and friends, insult or talk down to you?: Never    How often does anyone, including family and friends, threaten you with harm?: Never    How often does anyone, including family and friends, scream or curse at you?: Never  Alcohol Screening: Not At Risk (05/07/2024)   Received from [facility]   AUDIT-C    Q1: How often do you have a drink containing alcohol?: Never  Tobacco Use: Medium Risk (06/14/2024)   Patient History    Smoking Tobacco Use: Former    Smokeless Tobacco Use: Former  Depression: Not At Risk (04/21/2023)   Received from [facility]   Depression    PHQ Total Score: 0  Social Connections: Unknown (06/10/2024)   Social Connection and Isolation Panel    Frequency of Communication with Friends and Family: Twice a week    Frequency of Social Gatherings with Friends and Family: Twice a week    Attends Religious Services: More than 4  times per year    Active Member of [employer] or Organizations: No    Attends Banker Meetings: 1 to 4 times per year  Physicist, Medical Strain: Medium Risk (06/10/2024)   Overall Financial Resource Strain (CARDIA)    Difficulty of Paying Living Expenses: Somewhat hard  "

## 2024-06-17 NOTE — Nursing Note (Signed)
 Discharge paperwork discussed with patient at bedside. Pt verbalizes all understanding of all DC instructions, follow ups, medication changes, and upcoming labs.

## 2024-06-17 NOTE — Progress Notes (Signed)
 Case Management Update  Date: 06/17/2024   Time: 1:08 PM   Patient Type: Inpatient  This SW contacted by team regarding the need for INR and if [facility] is able to complete. This SW inquired if pt is a candidate for [facility] outpatient program if only needing INR.   Secure chat sent by [place], MD to Hospitalist secure chat. Pt was accept and determined to be in travel location. Appointment scheduled for Wednesday. Team to update family.        Anticipated Discharge Location: Home  If Plan A discharging location is not feasible: Potential Plan B: Home  [employer], MSW, LCSW

## 2024-06-17 NOTE — Progress Notes (Signed)
 Case Management Discharge Note        CSN: [id number] DOB: 1960-07-06 Service: General Medicine Location: 610/01  Patient Class: Inpatient  DC Disposition: : Home or Self Care  Discharge DC Disposition: : Home or Self Care Referrals: Hospitalist at Home (OP)  Discharge Referrals Patient Preference: Chosen geographical local area/county shared with patient/family: Return/previous involvement Patient Preference for Post-Acute Provider Form completed: Return/Previous Involvement Case closed, patient/family agree with disposition plan: Yes           Terrence Verdel Schlossman, MSW, LCSW

## 2024-06-17 NOTE — Progress Notes (Signed)
 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  She has a slight patient prosthesis mismatch across the aortic valve which is common in double valve procedures.  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  Valves have been functioning adequately thankfully.   Her acute anemia seems straightforwardly due to recent GI bleeding event.     Regarding the low level of hemolysis from her valves, unfortunately there is not much we can do for this medically although beta-blockers are sometimes felt to be possibly helpful we can trial a low-dose of beta-blocker as an outpatient after her blood counts have recovered some.  Otherwise the definitive treatment of course is valve replacement which she is not considered a candidate for.  06/16/2024 TTE notable for increased gradients through both the AVR and MVR which is felt due to hyperdynamic LV and severe anemia new from the prior study.  Would recommend to follow through with a cine fluoroscopy that can be done as an outpatient through her usual cardiologist given no clinical features suggesting acute obstruction and a clear clinically logical explanation for the increased gradients.   She is very well taken care of by her [facility] Cardiologist and has a follow-up with him scheduled 06/25/24.  If she is still in the hospital Tuesday, our next fully staffed workday, we can bring her down for the cine fluoroscopy, but she doesn't need to stay for  this.   Signing off, thanks for the consultation.   Brief HPI : [name] is a 64 y.o. 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 We have been asked to see her at the kind request of the hospitalist for evaluation of valve related hemolysis. [See detailed and excellent notes from her [facility] Cardiologist please].   The patient states she was getting more more fatigued and short of breath she went to call and was found to have a GI bleed and endoscopy that was cauterized.  She was sent home on warfarin and Lovenox  felt even worse and presented to [facility] noted to have an INR over 7 and sent here for further evaluation.  Underwent endoscopy here that showed her gastric bleeding site with no active bleeding.   Lab work here was notable for an LDH of 387.  She denies ever having jaundice and she has been aware of having some degree of hemolysis from her valves but anemia has always been due to GI bleeding.  In between  GI bleeding episodes her H&H are normal.  Denies anginal heart failure or arrhythmia symptoms.  TTE 06/16/24  SUMMARY  Increased gradients across AVR and MVR likely due to anemia and  hyperdynamic function. Consider cine  fluoroscopy or CT for further evaluation. Chronic severe pulm HTN.  The left ventricular size is normal with moderate concentric left  ventricular hypertrophy.  The left ventricle is hyperdynamic with ejection fraction = >70%.  Left ventricular diastolic function and atrial pressure are indeterminate  due to  mitral valve  replacement  The right  ventricle is moderate to severely dilated. Right ventricular  function cannot be formally  assessed due to poor image quality, general impression is reduced RV  systolic function.  The atria are mildly dilated.  There is a 19 mm St. Jude bileaflet valve present in the aortic position  [implanted 07-2014] that is  stable with increased gradients over baseline gradient of 28 and 15 mm Hg  respectively 05-01-2024  [[facility] report]. Current peak gradient 42 mm Hg through lateral flow  orifices and 50 mm Hg through  central nonflow orifice. This is likely due to hyperdynamic function and  anemia.  There is a 25 mm St. Jude mechanical valve present in the mitral position,  implanted 07-2014.  Gradients are increased from baseline 16 and 7 mm Hg on 05-01-24 again felt  due to hyperdynamic  function and anemia. Current gradients are 23 and 10 mm Hgmm, peak and  mean respectively.  There is moderate tricuspid regurgitation.  Severe pulmonary hypertension.  Estimated right ventricular systolic pressure is 102 mmHg. [RVP 91-9 mm Hg  on 04-2021 cath]  IVC size was moderately dilated. Right atrial pressure is estimated to be  15 mm Hg.  The aortic root is not well visualized.   Shelley Creighton MD   Intake/Output:  Intake/Output Summary (Last 24 hours) at 06/17/2024 0943 Last data filed at 06/17/2024 0353 Gross per 24 hour  Intake 600 ml  Output 1300 ml  Net -700 ml    Current Medications[1]  allopurinoL , 300 mg, oral, Daily cefTRIAXone , 2 g, intravenous, Q24H enoxaparin , 1 mg/kg, subcutaneous, Q12H SCH ferrous sulfate , 325 mg, oral, BID AC folic acid , 1 mg, oral, Daily furosemide , 40 mg, oral, QPM furosemide , 80 mg, oral, Daily ipratropium-albuterol , 3 mL, nebulization, RTID metoprolol  tartrate, 12.5 mg, oral, Q12H SCH montelukast , 10 mg, oral, At Bedtime mupirocin, 1 Application, Each Nostril, BID pravastatin , 80 mg, oral, At Bedtime Warfarin Pharmacy to Manage, , miscellaneous, Pharmacy  to Manage      Subjective:    Feels well, anxious for DC, no c/o.   Physical Examination:   Temp:  [97.1 F (36.2 C)-98.2 F (36.8 C)] 97.6 F (36.4 C) Heart Rate:  [61-79] 66 Resp:  [16-20] 16 BP: (102-170)/(51-81) 117/60 Wt Readings from Last 3 Encounters:  06/16/24 126 kg (278 lb)     Body mass index is 44.87 kg/m.  @WHVENTDATA @   Consitutional:  Well developed, well-nourished, obese C woman, looks pale not jaundiced and in no acute distress HEENT: Sclera anicteric, EOMI, conjunctiva normal Neck:  Supple, 2+ carotid pulsations with no carotid bruits. Normal JVP, no JVD or HJR.   Respiratory:  Clear to auscultation bilaterally. No rales, rhonchi, rubs, or wheezes. No use of accessory muscles noted Cardiovascular:  Regular rate & rhythm, crisp mech Heart sounds, flow murmur appropriate for AVR, peak grad est 20-25 mm Hg. no rubs, thrills, or S3 or S4 gallops appreciated, truly sounds appropriate for valves and nonobstructive Gastrointestinal:  Abd soft, nontender, non-distended, normoactive bowel sounds Musculoskeletal  No significant abnormalities in gait. Skin: Warm and dry to touch Extremities:  No clubbing,  or cyanosis. No pitting edema of bilateral lower extremities. 2+ and symmetrical dorsalis pedis and posterior tibial pulses.   Neurological:  Alert and oriented x 3, no focal deficits Psychiatric: Affect appropriate. Cooperative   Objective Data Reviewed During this Patient Encounter:    Telemetry Review:     EKG:     Labs:  No results found for: CKTOTAL, CKMB, CKMBINDEX Results from last 7 days  Lab Units 06/17/24 0256 06/16/24 1606 06/16/24 0443 06/15/24 0854 06/15/24 0226  WHITE BLOOD CELL COUNT 10*3/uL 11.88*  --  9.43  --  9.14  HEMOGLOBIN g/dL 7.4* 8.0* 7.3*   < > 7.4*  HEMATOCRIT % 24.2* 25.6* 22.5*   < > 23.5*  PLATELET COUNT 10*3/uL 301  --  271  --  231   < > = values in this interval not displayed.  SABRA Results from last 7 days   Lab Units 06/17/24 0256 06/16/24 0443 06/15/24 0226  SODIUM mmol/L 142 141 139  POTASSIUM mmol/L 3.8 3.8 4.2  CHLORIDE mmol/L 107 108* 109*  CO2 mmol/L 27 27 24   BUN mg/dL 34* 30* 30*  CREATININE mg/dL 8.54* 8.69* 8.86  GLUCOSE mg/dL 96 91 90  CALCIUM  mg/dL 9.3 9.4 9.2   Lab Results  Component Value Date   MG 1.9 06/17/2024   AST 24 06/15/2024   ALT 9 06/15/2024   BILITOT 0.6 06/15/2024   INR 1.2 06/17/2024     TotalTime   I have personally spent  minutes involved in face-to-face and non-face-to-face activities for this patient on the day of the visit.  Professional time spent includes chart review, obtaining history, physical exam, counseling, documenting, care coordination   Shelley Creighton MD       [1]  Current Facility-Administered Medications:    acetaminophen  (TYLENOL ) tablet 650 mg, 650 mg, oral, Q6H PRN, Jama Rome Louder, MD, 650 mg at 06/16/24 2255   albuterol  sulfate 2.5 mg/0.5 mL nebulizer solution 2.5 mg, 2.5 mg, nebulization, Q6H PRN, Basavatti Madappa Sowmya, MD   allopurinoL  (ZYLOPRIM ) tablet 300 mg, 300 mg, oral, Daily, Jama Rome Louder, MD, 300 mg at 06/17/24 0848   cefTRIAXone  (ROCEPHIN ) injection 2 g, 2 g, intravenous, Q24H, Basavatti Madappa Sowmya, MD, 2 g at 06/16/24 1534   dextrose  (D50W) 50 % injection 12.5 g, 12.5 g, intravenous, PRN, Jama Rome Louder, MD   dextrose  (GLUTOSE) 40 % oral gel 15 g, 15 g, oral, PRN, Jama Rome Louder, MD   enoxaparin  (LOVENOX ) syringe 120 mg, 1 mg/kg, subcutaneous, Q12H SCH, Basavatti Madappa Sowmya, MD, 120 mg at 06/17/24 0848   ferrous sulfate  325 mg (65 mg iron ) tablet 325 mg, 325 mg, oral, BID AC, Basavatti Madappa Sowmya, MD, 325 mg at 06/17/24 0847   folic acid  (FOLVITE ) tablet 1 mg, 1 mg, oral, Daily, Basavatti Madappa Sowmya, MD, 1 mg at 06/17/24 0848   furosemide  (LASIX ) tablet 40 mg, 40 mg, oral, QPM, Basavatti Madappa Sowmya, MD   furosemide  (LASIX ) tablet 80 mg, 80 mg, oral, Daily, Basavatti  Madappa Sowmya, MD, 80 mg at 06/17/24 0847   ipratropium-albuterol  (DUO-NEB) 0.5-2.5 mg/3 mL nebulizer solution 3 mL, 3 mL, nebulization, RTID, Basavatti Madappa Sowmya, MD, 3 mL at 06/17/24 0735   melatonin tablet 3 mg, 3 mg, oral, At Bedtime PRN, Jama Rome Louder, MD, 3 mg at 06/16/24 2255   metoprolol  tartrate (LOPRESSOR ) split tablet  12.5 mg, 12.5 mg, oral, Q12H SCH, Santosh K Dhungana, MD, 12.5 mg at 06/17/24 0847   montelukast  (SINGULAIR ) tablet 10 mg, 10 mg, oral, At Bedtime, Jama Rome Louder, MD, 10 mg at 06/16/24 2123   mupirocin (BACTROBAN) 2 % ointment 1 Application, 1 Application, Each Nostril, BID, Basavatti Madappa Sowmya, MD, 1 Application at 06/17/24 0847   ondansetron  (ZOFRAN ) injection 4 mg, 4 mg, intravenous, Q6H PRN, Jama Rome Louder, MD   ondansetron  (ZOFRAN ) injection 4 mg, 4 mg, intravenous, Q6H PRN, Caprice Neill Mose, FNP   pravastatin  (PRAVACHOL ) tablet 80 mg, 80 mg, oral, At Bedtime, Jama Rome Louder, MD, 80 mg at 06/16/24 2123   Warfarin Pharmacy to Manage, , miscellaneous, Pharmacy to Manage, [place] Otha Olives, MD  Facility-Administered Medications Ordered in Other Encounters:    lactated ringer 's infusion, , intravenous, Continuous PRN, Honora Garnette Duncan, CRNA, New Bag at 06/11/24 0800 "

## 2024-06-18 ENCOUNTER — Inpatient Hospital Stay: Attending: Oncology | Admitting: Oncology

## 2024-06-18 ENCOUNTER — Inpatient Hospital Stay

## 2024-06-18 ENCOUNTER — Other Ambulatory Visit: Payer: Self-pay | Admitting: Oncology

## 2024-06-18 VITALS — BP 118/53 | HR 66 | Temp 97.8°F | Resp 14 | Ht 66.0 in | Wt 273.3 lb

## 2024-06-18 DIAGNOSIS — Z954 Presence of other heart-valve replacement: Secondary | ICD-10-CM | POA: Insufficient documentation

## 2024-06-18 DIAGNOSIS — D599 Acquired hemolytic anemia, unspecified: Secondary | ICD-10-CM

## 2024-06-18 DIAGNOSIS — D649 Anemia, unspecified: Secondary | ICD-10-CM

## 2024-06-18 DIAGNOSIS — R7402 Elevation of levels of lactic acid dehydrogenase (LDH): Secondary | ICD-10-CM | POA: Insufficient documentation

## 2024-06-18 DIAGNOSIS — D589 Hereditary hemolytic anemia, unspecified: Secondary | ICD-10-CM | POA: Insufficient documentation

## 2024-06-18 LAB — CMP (CANCER CENTER ONLY)
ALT: 6 U/L (ref 0–44)
AST: 27 U/L (ref 15–41)
Albumin: 3.5 g/dL (ref 3.5–5.0)
Alkaline Phosphatase: 62 U/L (ref 38–126)
Anion gap: 10 (ref 5–15)
BUN: 25 mg/dL — ABNORMAL HIGH (ref 8–23)
CO2: 27 mmol/L (ref 22–32)
Calcium: 10.1 mg/dL (ref 8.9–10.3)
Chloride: 108 mmol/L (ref 98–111)
Creatinine: 1.28 mg/dL — ABNORMAL HIGH (ref 0.44–1.00)
GFR, Estimated: 47 mL/min — ABNORMAL LOW
Glucose, Bld: 97 mg/dL (ref 70–99)
Potassium: 4 mmol/L (ref 3.5–5.1)
Sodium: 145 mmol/L (ref 135–145)
Total Bilirubin: 0.6 mg/dL (ref 0.0–1.2)
Total Protein: 7.3 g/dL (ref 6.5–8.1)

## 2024-06-18 LAB — CBC WITH DIFFERENTIAL (CANCER CENTER ONLY)
Abs Immature Granulocytes: 0.23 K/uL — ABNORMAL HIGH (ref 0.00–0.07)
Basophils Absolute: 0.1 K/uL (ref 0.0–0.1)
Basophils Relative: 1 %
Eosinophils Absolute: 0.4 K/uL (ref 0.0–0.5)
Eosinophils Relative: 4 %
HCT: 27.9 % — ABNORMAL LOW (ref 36.0–46.0)
Hemoglobin: 7.8 g/dL — ABNORMAL LOW (ref 12.0–15.0)
Immature Granulocytes: 2 %
Lymphocytes Relative: 7 %
Lymphs Abs: 0.7 K/uL (ref 0.7–4.0)
MCH: 26.4 pg (ref 26.0–34.0)
MCHC: 28 g/dL — ABNORMAL LOW (ref 30.0–36.0)
MCV: 94.3 fL (ref 80.0–100.0)
Monocytes Absolute: 0.9 K/uL (ref 0.1–1.0)
Monocytes Relative: 9 %
Neutro Abs: 8.2 K/uL — ABNORMAL HIGH (ref 1.7–7.7)
Neutrophils Relative %: 77 %
Platelet Count: 335 K/uL (ref 150–400)
RBC: 2.96 MIL/uL — ABNORMAL LOW (ref 3.87–5.11)
RDW: 21.2 % — ABNORMAL HIGH (ref 11.5–15.5)
WBC Count: 10.4 K/uL (ref 4.0–10.5)
nRBC: 0 % (ref 0.0–0.2)

## 2024-06-18 LAB — RETICULOCYTES
Immature Retic Fract: 35.4 % — ABNORMAL HIGH (ref 2.3–15.9)
RBC.: 3 MIL/uL — ABNORMAL LOW (ref 3.87–5.11)
Retic Count, Absolute: 162.5 K/uL (ref 19.0–186.0)
Retic Ct Pct: 5.4 % — ABNORMAL HIGH (ref 0.4–3.1)

## 2024-06-18 LAB — FOLATE: Folate: 16.9 ng/mL

## 2024-06-18 LAB — FERRITIN: Ferritin: 122 ng/mL (ref 11–307)

## 2024-06-18 LAB — LACTATE DEHYDROGENASE: LDH: 537 U/L — ABNORMAL HIGH (ref 105–235)

## 2024-06-18 LAB — IRON AND TIBC
Iron: 41 ug/dL (ref 28–170)
Saturation Ratios: 12 % (ref 10.4–31.8)
TIBC: 339 ug/dL (ref 250–450)
UIBC: 298 ug/dL

## 2024-06-18 LAB — VITAMIN B12: Vitamin B-12: 286 pg/mL (ref 180–914)

## 2024-06-18 NOTE — Progress Notes (Unsigned)
 "  [facility]  [address] [place],  [place]  [address] [phone]  Clinic Day:  03/08/2023  Referring physician: No ref. 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  This is based upon her having an elevated LDH and undetectable haptoglobin level.  Of note, she has 2 mechanical valves which are believed to be the culprits behind her hemolytic process.  Initially, the patient was placed on monthly Retacrit  injections to get her hemoglobin at least above 10.  As her hemoglobin has improved over time, her Retacrit  injections have been held.  She comes to the clinic to reassess her anemia.  Since her last visit, the patient has been doing okay.  She denies having increased fatigue or other symptoms which concern her for recurrent anemia.  PHYSICAL EXAM:  There were no vitals taken for this visit. Wt Readings from Last 3 Encounters:  06/01/24 274 lb (124.3 kg)  09/30/23 262 lb (118.8 kg)  08/02/23 261 lb 3.2 oz (118.5 kg)   There is no height or weight on file to calculate BMI. Performance status (ECOG): 2 - Symptomatic, <50% confined to bed Physical Exam Constitutional:      Appearance: Normal appearance. She is not ill-appearing.     Comments: A chronically ill appearing woman wearing oxygen  per nasal canula  HENT:     Mouth/Throat:     Mouth: Mucous membranes are moist.     Pharynx: Oropharynx is clear. No oropharyngeal exudate or posterior oropharyngeal erythema.  Cardiovascular:     Rate and Rhythm: Normal rate and regular rhythm.     Heart sounds: No murmur heard.    No friction rub. No gallop.  Pulmonary:     Effort: Pulmonary effort is normal. No respiratory distress.     Breath sounds: Normal breath sounds. No wheezing, rhonchi or rales.  Abdominal:     General: Bowel sounds are normal. There is no distension.     Palpations: Abdomen is soft. There is no mass.     Tenderness: There  is no abdominal tenderness.  Musculoskeletal:        General: No swelling.     Right lower leg: No swelling. No edema.     Left lower leg: No edema.  Lymphadenopathy:     Cervical: No cervical adenopathy.     Upper Body:     Right upper body: No supraclavicular or axillary adenopathy.     Left upper body: No supraclavicular or axillary adenopathy.     Lower Body: No right inguinal adenopathy. No left inguinal adenopathy.  Skin:    General: Skin is warm.     Coloration: Skin is not jaundiced.     Findings: No lesion or rash.  Neurological:     General: No focal deficit present.     Mental Status: She is alert and oriented to person, place, and time. Mental status is at baseline.  Psychiatric:        Mood and Affect: Mood normal.        Behavior: Behavior normal.        Thought Content: Thought content normal.     LABS:      Latest Ref Rng & Units 06/05/2024    4:30 AM 06/04/2024    3:52 AM 06/03/2024    6:51 AM  CBC  WBC 4.0 - 10.5 K/uL 8.1  9.2  7.1   Hemoglobin 12.0 - 15.0  g/dL 8.0  8.2  7.9   Hematocrit 36.0 - 46.0 % 28.0  28.6  27.2   Platelets 150 - 400 K/uL 238  262  236       Latest Ref Rng & Units 06/05/2024    4:30 AM 06/04/2024    3:52 AM 06/03/2024    6:51 AM  CMP  Glucose 70 - 99 mg/dL 79  91  80   BUN 8 - 23 mg/dL 25  19  24    Creatinine 0.44 - 1.00 mg/dL 8.27  8.81  8.84   Sodium 135 - 145 mmol/L 142  140  140   Potassium 3.5 - 5.1 mmol/L 4.5  4.1  4.1   Chloride 98 - 111 mmol/L 105  104  103   CO2 22 - 32 mmol/L 27  28  28    Calcium  8.9 - 10.3 mg/dL 9.7  9.7  9.5   Total Protein 6.5 - 8.1 g/dL 7.1  7.2  7.1   Total Bilirubin 0.0 - 1.2 mg/dL 0.6  0.7  0.8   Alkaline Phos 38 - 126 U/L 66  71  69   AST 15 - 41 U/L 23  25  23    ALT 0 - 44 U/L 11  12  10      ASSESSMENT & PLAN:  Assessment/Plan:  A 64 y.o. 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  Although lower, her hemoglobin of 13.5 today  remains more than ideal.  As her hemoglobin has been stable for at least the past year, I do feel comfortable turning her care back over to her primary care office.  My recommendation would be for CBC to be checked 2-3 times per year.  I would not have a problem reevaluating this patient if her anemia returns to where repeat clinical assessment is warranted.   The patient understands all the plans discussed today and is in agreement with them.  Selestino Nila DELENA Kerns, MD      "

## 2024-06-18 NOTE — Progress Notes (Signed)
 "  [facility]  [address] [place],  [place]  [address] [phone]  Clinic Day:  03/08/2023  Referring physician: Emilio Joesph DEL, PA-C  HISTORY OF PRESENT ILLNESS:  The patient is a 64 y.o. female with a known history of hemolytic anemia.  This is based upon her having an elevated LDH and an undetectable haptoglobin level.  Of note, she has 2 mechanical valves which are believed to be the culprits behind her hemolytic process.  In the past, the patient was placed on monthly Retacrit  injections to get her hemoglobin at least above 10.  At the time of her last visit with me in October 2024, her hemoglobin was ideal at 13.5.  Unfortunately, during the month of January 2026, the patient has been hospitalized at multiple local hospitals.  Although the patient denies having any overt forms of blood loss, fecal occult blood testing was done, which came back positive.  The patient did undergo a capsule endoscopy, which revealed a nonbleeding gastric ulcer and gastritis.  Nonspecific scalloping of her duodenal mucosa was also appreciated.  Her  gastric ulcer was ultimately cauterized per an EGD.  During her hospitalizations, she was transfused multiple units of blood and given IV iron .  She did undergo an echocardiogram over the weekend as an inpatient, which revealed increased gradients across both her aortic and mitral valves.  According to the patient, additional cardiology studies are to be done next week.  Of note, she is taking Lovenox /warfarin for her mechanical heart valves. Her INR yesterday was 1.2; she remains on Lovenox  until she becomes therapeutic again on warfarin.  She comes into clinic today feeling weak, but is otherwise doing okay.    PHYSICAL EXAM:  Blood pressure (!) 118/53, pulse 66, temperature 97.8 F (36.6 C), temperature source Oral, resp. rate 14, height 5' 6 (1.676 m), weight 273 lb 4.8 oz (124 kg), SpO2 92%. Wt Readings from Last 3 Encounters:   06/18/24 273 lb 4.8 oz (124 kg)  06/01/24 274 lb (124.3 kg)  09/30/23 262 lb (118.8 kg)   Body mass index is 44.11 kg/m. Performance status (ECOG): 2 - Symptomatic, <50% confined to bed Physical Exam Constitutional:      Appearance: Normal appearance. She is not ill-appearing.     Comments: A chronically ill appearing woman in a wheelchair.  She looks very pale   HENT:     Mouth/Throat:     Mouth: Mucous membranes are moist.     Pharynx: Oropharynx is clear. No oropharyngeal exudate or posterior oropharyngeal erythema.  Cardiovascular:     Rate and Rhythm: Normal rate and regular rhythm.     Heart sounds: No murmur heard.    No friction rub. No gallop.  Pulmonary:     Effort: Pulmonary effort is normal. No respiratory distress.     Breath sounds: Normal breath sounds. No wheezing, rhonchi or rales.  Abdominal:     General: Bowel sounds are normal. There is no distension.     Palpations: Abdomen is soft. There is no mass.     Tenderness: There is no abdominal tenderness.  Musculoskeletal:        General: No swelling.     Right lower leg: No swelling. No edema.     Left lower leg: No edema.  Lymphadenopathy:     Cervical: No cervical adenopathy.     Upper Body:     Right upper body: No supraclavicular or axillary adenopathy.  Left upper body: No supraclavicular or axillary adenopathy.     Lower Body: No right inguinal adenopathy. No left inguinal adenopathy.  Skin:    General: Skin is warm.     Coloration: Skin is not jaundiced.     Findings: No lesion or rash.  Neurological:     General: No focal deficit present.     Mental Status: She is alert and oriented to person, place, and time. Mental status is at baseline.  Psychiatric:        Mood and Affect: Mood normal.        Behavior: Behavior normal.        Thought Content: Thought content normal.    LABS:      Latest Ref Rng & Units 06/18/2024    3:43 PM 06/05/2024    4:30 AM 06/04/2024    3:52 AM  CBC  WBC 4.0  - 10.5 K/uL 10.4  8.1  9.2   Hemoglobin 12.0 - 15.0 g/dL 7.8  8.0  8.2   Hematocrit 36.0 - 46.0 % 27.9  28.0  28.6   Platelets 150 - 400 K/uL 335  238  262       Latest Ref Rng & Units 06/18/2024    3:43 PM 06/05/2024    4:30 AM 06/04/2024    3:52 AM  CMP  Glucose 70 - 99 mg/dL 97  79  91   BUN 8 - 23 mg/dL 25  25  19    Creatinine 0.44 - 1.00 mg/dL 8.71  8.27  8.81   Sodium 135 - 145 mmol/L 145  142  140   Potassium 3.5 - 5.1 mmol/L 4.0  4.5  4.1   Chloride 98 - 111 mmol/L 108  105  104   CO2 22 - 32 mmol/L 27  27  28    Calcium  8.9 - 10.3 mg/dL 89.8  9.7  9.7   Total Protein 6.5 - 8.1 g/dL 7.3  7.1  7.2   Total Bilirubin 0.0 - 1.2 mg/dL 0.6  0.6  0.7   Alkaline Phos 38 - 126 U/L 62  66  71   AST 15 - 41 U/L 27  23  25    ALT 0 - 44 U/L 6  11  12      Latest Reference Range & Units 06/18/24 15:43  LDH 105 - 235 U/L 537 (H)  (H): Data is abnormally high  Latest Reference Range & Units 06/18/24 15:42  RBC. 3.87 - 5.11 MIL/uL 3.00 (L)  Retic Ct Pct 0.4 - 3.1 % 5.4 (H)  Retic Count, Absolute 19.0 - 186.0 K/uL 162.5  Immature Retic Fract 2.3 - 15.9 % 35.4 (H)  (L): Data is abnormally low (H): Data is abnormally high  Her peripheral smear is pertinent for schistocytes, polychromasia, and anisocytosis with her red cells.    ASSESSMENT & PLAN:  Assessment/Plan:  A 64 y.o. 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 When evaluating her entire clinical picture, it appears her hemolytic anemia from her mechanical heart valves has become a prominent issue again.  She is being followed by cardiology, who will reassess her next week.  Her iron , B12, and folate levels will be checked today.  Her haptoglobin is pending, but her LDH continues to rise.  Interventions that could be given to improve her anemia include blood transfusions and Retacrit  therapy.  I will see her back next week to go over all  of her labs today, as well as to  reassess her CBC at that time. The patient understands all the plans discussed today and knows to contact our office before her next visit if she thinks her blood needs to be checked sooner.    Diana Hartgrove DELENA Kerns, MD      "

## 2024-06-19 ENCOUNTER — Telehealth: Payer: Self-pay | Admitting: Oncology

## 2024-06-19 ENCOUNTER — Ambulatory Visit: Admitting: Emergency Medicine

## 2024-06-19 LAB — HAPTOGLOBIN: Haptoglobin: <10 mg/dL — ABNORMAL LOW (ref 37–355)

## 2024-06-19 NOTE — Telephone Encounter (Signed)
 Patient has been scheduled for follow-up visit per 06/19/2024 LOS.  Pt aware of scheduled appt details.
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
## 2024-06-25 ENCOUNTER — Inpatient Hospital Stay

## 2024-06-25 ENCOUNTER — Inpatient Hospital Stay: Admitting: Oncology

## 2024-06-25 ENCOUNTER — Ambulatory Visit: Admitting: General Practice

## 2024-06-25 NOTE — Progress Notes (Unsigned)
 "  [facility]  [address] [place],  [place]  [address] [phone]  Clinic Day:  03/08/2023  Referring physician: Emilio Joesph DEL, PA-C  HISTORY OF PRESENT ILLNESS:  The patient is a 64 y.o. female with a known history of hemolytic anemia.  This is based upon her having an elevated LDH and an undetectable haptoglobin level.  Of note, she has 2 mechanical valves which are believed to be the culprits behind her hemolytic process.  In the past, the patient was placed on monthly Retacrit  injections to get her hemoglobin at least above 10.  At the time of her last visit with me in October 2024, her hemoglobin was ideal at 13.5.  Unfortunately, during the month of January 2026, the patient has been hospitalized at multiple local hospitals.  Although the patient denies having any overt forms of blood loss, fecal occult blood testing was done, which came back positive.  The patient did undergo a capsule endoscopy, which revealed a nonbleeding gastric ulcer and gastritis.  Nonspecific scalloping of her duodenal mucosa was also appreciated.  Her  gastric ulcer was ultimately cauterized per an EGD.  During her hospitalizations, she was transfused multiple units of blood and given IV iron .  She did undergo an echocardiogram over the weekend as an inpatient, which revealed increased gradients across both her aortic and mitral valves.  According to the patient, additional cardiology studies are to be done next week.  Of note, she is taking Lovenox /warfarin for her mechanical heart valves. Her INR yesterday was 1.2; she remains on Lovenox  until she becomes therapeutic again on warfarin.  She comes into clinic today feeling weak, but is otherwise doing okay.    PHYSICAL EXAM:  There were no vitals taken for this visit. Wt Readings from Last 3 Encounters:  06/18/24 273 lb 4.8 oz (124 kg)  06/01/24 274 lb (124.3 kg)  09/30/23 262 lb (118.8 kg)   There is no height or weight on  file to calculate BMI. Performance status (ECOG): 2 - Symptomatic, <50% confined to bed Physical Exam Constitutional:      Appearance: Normal appearance. She is not ill-appearing.     Comments: A chronically ill appearing woman in a wheelchair.  She looks very pale   HENT:     Mouth/Throat:     Mouth: Mucous membranes are moist.     Pharynx: Oropharynx is clear. No oropharyngeal exudate or posterior oropharyngeal erythema.  Cardiovascular:     Rate and Rhythm: Normal rate and regular rhythm.     Heart sounds: No murmur heard.    No friction rub. No gallop.  Pulmonary:     Effort: Pulmonary effort is normal. No respiratory distress.     Breath sounds: Normal breath sounds. No wheezing, rhonchi or rales.  Abdominal:     General: Bowel sounds are normal. There is no distension.     Palpations: Abdomen is soft. There is no mass.     Tenderness: There is no abdominal tenderness.  Musculoskeletal:        General: No swelling.     Right lower leg: No swelling. No edema.     Left lower leg: No edema.  Lymphadenopathy:     Cervical: No cervical adenopathy.     Upper Body:     Right upper body: No supraclavicular or axillary adenopathy.     Left upper body: No supraclavicular or axillary adenopathy.     Lower Body: No right inguinal adenopathy.  No left inguinal adenopathy.  Skin:    General: Skin is warm.     Coloration: Skin is not jaundiced.     Findings: No lesion or rash.  Neurological:     General: No focal deficit present.     Mental Status: She is alert and oriented to person, place, and time. Mental status is at baseline.  Psychiatric:        Mood and Affect: Mood normal.        Behavior: Behavior normal.        Thought Content: Thought content normal.    LABS:      Latest Ref Rng & Units 06/18/2024    3:43 PM 06/05/2024    4:30 AM 06/04/2024    3:52 AM  CBC  WBC 4.0 - 10.5 K/uL 10.4  8.1  9.2   Hemoglobin 12.0 - 15.0 g/dL 7.8  8.0  8.2   Hematocrit 36.0 - 46.0 % 27.9   28.0  28.6   Platelets 150 - 400 K/uL 335  238  262       Latest Ref Rng & Units 06/18/2024    3:43 PM 06/05/2024    4:30 AM 06/04/2024    3:52 AM  CMP  Glucose 70 - 99 mg/dL 97  79  91   BUN 8 - 23 mg/dL 25  25  19    Creatinine 0.44 - 1.00 mg/dL 8.71  8.27  8.81   Sodium 135 - 145 mmol/L 145  142  140   Potassium 3.5 - 5.1 mmol/L 4.0  4.5  4.1   Chloride 98 - 111 mmol/L 108  105  104   CO2 22 - 32 mmol/L 27  27  28    Calcium  8.9 - 10.3 mg/dL 89.8  9.7  9.7   Total Protein 6.5 - 8.1 g/dL 7.3  7.1  7.2   Total Bilirubin 0.0 - 1.2 mg/dL 0.6  0.6  0.7   Alkaline Phos 38 - 126 U/L 62  66  71   AST 15 - 41 U/L 27  23  25    ALT 0 - 44 U/L 6  11  12      Latest Reference Range & Units 06/18/24 15:43  Iron  28 - 170 ug/dL 41  UIBC ug/dL 701  TIBC 749 - 549 ug/dL 660  Saturation Ratios 10.4 - 31.8 % 12  Ferritin 11 - 307 ng/mL 122  Folate >5.9 ng/mL 16.9  Vitamin B12 180 - 914 pg/mL 286    Latest Reference Range & Units 06/18/24 15:43  LDH 105 - 235 U/L 537 (H)  (H): Data is abnormally high  Latest Reference Range & Units 06/18/24 15:42  RBC. 3.87 - 5.11 MIL/uL 3.00 (L)  Retic Ct Pct 0.4 - 3.1 % 5.4 (H)  Retic Count, Absolute 19.0 - 186.0 K/uL 162.5  Immature Retic Fract 2.3 - 15.9 % 35.4 (H)  (L): Data is abnormally low (H): Data is abnormally high  Her peripheral smear is pertinent for schistocytes, polychromasia, and anisocytosis with her red cells.    ASSESSMENT & PLAN:  Assessment/Plan:  A 64 y.o. 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 When evaluating her entire clinical picture, it appears her hemolytic anemia from her mechanical heart valves has become a prominent issue again.  She is being followed by cardiology, who will reassess her next week.  Her iron , B12, and folate levels will be checked today.  Her haptoglobin is pending, but her LDH continues to rise.  Interventions that could be given to improve her  anemia include blood transfusions and Retacrit  therapy.  I will see her back next week to go over all of her labs today, as well as to reassess her CBC at that time. The patient understands all the plans discussed today and knows to contact our office before her next visit if she thinks her blood needs to be checked sooner.    Clancey Welton DELENA Kerns, MD      "

## 2024-06-26 ENCOUNTER — Other Ambulatory Visit: Payer: Self-pay | Admitting: Oncology

## 2024-06-26 ENCOUNTER — Inpatient Hospital Stay

## 2024-06-26 ENCOUNTER — Other Ambulatory Visit: Payer: Self-pay | Admitting: Pharmacist

## 2024-06-26 ENCOUNTER — Inpatient Hospital Stay: Admitting: Oncology

## 2024-06-26 VITALS — BP 135/69 | HR 73 | Temp 98.0°F | Resp 16 | Ht 66.0 in | Wt 266.8 lb

## 2024-06-26 DIAGNOSIS — Z7901 Long term (current) use of anticoagulants: Secondary | ICD-10-CM

## 2024-06-26 DIAGNOSIS — D589 Hereditary hemolytic anemia, unspecified: Secondary | ICD-10-CM | POA: Diagnosis not present

## 2024-06-26 DIAGNOSIS — D599 Acquired hemolytic anemia, unspecified: Secondary | ICD-10-CM

## 2024-06-26 DIAGNOSIS — D649 Anemia, unspecified: Secondary | ICD-10-CM

## 2024-06-26 DIAGNOSIS — N1832 Chronic kidney disease, stage 3b: Secondary | ICD-10-CM

## 2024-06-26 DIAGNOSIS — D631 Anemia in chronic kidney disease: Secondary | ICD-10-CM

## 2024-06-26 LAB — CBC WITH DIFFERENTIAL (CANCER CENTER ONLY)
Abs Immature Granulocytes: 0.05 10*3/uL (ref 0.00–0.07)
Basophils Absolute: 0 10*3/uL (ref 0.0–0.1)
Basophils Relative: 1 %
Eosinophils Absolute: 0.2 10*3/uL (ref 0.0–0.5)
Eosinophils Relative: 3 %
HCT: 30.8 % — ABNORMAL LOW (ref 36.0–46.0)
Hemoglobin: 8.9 g/dL — ABNORMAL LOW (ref 12.0–15.0)
Immature Granulocytes: 1 %
Lymphocytes Relative: 11 %
Lymphs Abs: 0.8 10*3/uL (ref 0.7–4.0)
MCH: 27.1 pg (ref 26.0–34.0)
MCHC: 28.9 g/dL — ABNORMAL LOW (ref 30.0–36.0)
MCV: 93.9 fL (ref 80.0–100.0)
Monocytes Absolute: 0.6 10*3/uL (ref 0.1–1.0)
Monocytes Relative: 9 %
Neutro Abs: 5.5 10*3/uL (ref 1.7–7.7)
Neutrophils Relative %: 75 %
Platelet Count: 263 10*3/uL (ref 150–400)
RBC: 3.28 MIL/uL — ABNORMAL LOW (ref 3.87–5.11)
RDW: 22.1 % — ABNORMAL HIGH (ref 11.5–15.5)
WBC Count: 7.3 10*3/uL (ref 4.0–10.5)
nRBC: 0 % (ref 0.0–0.2)

## 2024-06-26 LAB — PROTIME-INR
INR: 2.9 — ABNORMAL HIGH (ref 0.8–1.2)
Prothrombin Time: 31.8 s — ABNORMAL HIGH (ref 11.4–15.2)

## 2024-06-26 MED ORDER — EPOETIN ALFA-EPBX 20000 UNIT/ML IJ SOLN
20000.0000 [IU] | Freq: Once | INTRAMUSCULAR | Status: AC
  Administered 2024-06-26: 20000 [IU] via SUBCUTANEOUS
  Filled 2024-06-26: qty 1

## 2024-06-26 NOTE — Patient Instructions (Signed)

## 2024-06-27 ENCOUNTER — Telehealth: Payer: Self-pay | Admitting: Pharmacist

## 2024-06-27 NOTE — Telephone Encounter (Signed)
 Patient called, had INR lab drawn yesterday. INR was 2.9, patient did not take warfarin last night. Advised she can take 2 tablets today and discontinue Lovenox .  Appt scheduled for [place] on 07/10/24

## 2024-07-10 ENCOUNTER — Ambulatory Visit

## 2024-07-16 ENCOUNTER — Ambulatory Visit: Admitting: Emergency Medicine

## 2024-07-27 ENCOUNTER — Inpatient Hospital Stay

## 2024-08-06 ENCOUNTER — Ambulatory Visit: Admitting: Physician Assistant

## 2024-08-24 ENCOUNTER — Inpatient Hospital Stay

## 2024-09-24 ENCOUNTER — Inpatient Hospital Stay: Admitting: Oncology

## 2024-09-24 ENCOUNTER — Inpatient Hospital Stay
